# Patient Record
Sex: Male | Born: 1945
Health system: Southern US, Community
[De-identification: ages and names within clinical notes are randomized; demographics above are authoritative.]

## PROBLEM LIST (undated history)

## (undated) DIAGNOSIS — E785 Hyperlipidemia, unspecified: Secondary | ICD-10-CM

## (undated) DIAGNOSIS — Z720 Tobacco use: Secondary | ICD-10-CM

## (undated) DIAGNOSIS — Z9289 Personal history of other medical treatment: Secondary | ICD-10-CM

## (undated) DIAGNOSIS — N186 End stage renal disease: Secondary | ICD-10-CM

## (undated) DIAGNOSIS — F1010 Alcohol abuse, uncomplicated: Secondary | ICD-10-CM

## (undated) DIAGNOSIS — I219 Acute myocardial infarction, unspecified: Secondary | ICD-10-CM

## (undated) DIAGNOSIS — N1832 Chronic kidney disease, stage 3b: Secondary | ICD-10-CM

## (undated) DIAGNOSIS — M1990 Unspecified osteoarthritis, unspecified site: Secondary | ICD-10-CM

## (undated) DIAGNOSIS — I10 Essential (primary) hypertension: Secondary | ICD-10-CM

## (undated) HISTORY — PX: HERNIA REPAIR: SHX51

## (undated) HISTORY — DX: Personal history of other medical treatment: Z92.89

## (undated) HISTORY — DX: Unspecified osteoarthritis, unspecified site: M19.90

## (undated) HISTORY — PX: SPINE SURGERY: SHX786

## (undated) HISTORY — DX: Essential (primary) hypertension: I10

## (undated) HISTORY — DX: Hyperlipidemia, unspecified: E78.5

## (undated) HISTORY — PX: BACK SURGERY: SHX140

---

## 2002-08-09 ENCOUNTER — Encounter: Admission: RE | Admit: 2002-08-09 | Discharge: 2002-09-09 | Payer: Self-pay | Admitting: Neurosurgery

## 2008-08-25 ENCOUNTER — Encounter: Admission: RE | Admit: 2008-08-25 | Discharge: 2008-08-25 | Payer: Self-pay | Admitting: Internal Medicine

## 2008-09-02 DIAGNOSIS — Z9289 Personal history of other medical treatment: Secondary | ICD-10-CM

## 2008-09-02 HISTORY — DX: Personal history of other medical treatment: Z92.89

## 2012-03-08 ENCOUNTER — Other Ambulatory Visit: Payer: Self-pay | Admitting: Nephrology

## 2012-03-15 ENCOUNTER — Ambulatory Visit
Admission: RE | Admit: 2012-03-15 | Discharge: 2012-03-15 | Disposition: A | Discharge status: Home / Self Care | Payer: BC Managed Care – PPO | Source: Ambulatory Visit | Attending: Nephrology | Admitting: Nephrology

## 2012-09-20 ENCOUNTER — Telehealth: Payer: Self-pay | Admitting: Cardiovascular Disease

## 2012-09-20 MED ORDER — SIMVASTATIN 40 MG PO TABS: 40.0000 mg | ORAL_TABLET | Freq: Every day | ORAL | Status: DC

## 2012-09-20 NOTE — Telephone Encounter (Signed)
Need refill on his Simvastatin 40mg  #60-Call to [facility] on [address]!

## 2013-02-17 ENCOUNTER — Encounter: Payer: Self-pay | Admitting: *Deleted

## 2013-02-21 ENCOUNTER — Encounter: Payer: Self-pay | Admitting: Physician Assistant

## 2013-02-21 ENCOUNTER — Ambulatory Visit (INDEPENDENT_AMBULATORY_CARE_PROVIDER_SITE_OTHER): Payer: BC Managed Care – PPO | Admitting: Physician Assistant

## 2013-02-21 VITALS — BP 110/60 | HR 82 | Ht 70.0 in | Wt 196.0 lb

## 2013-02-21 DIAGNOSIS — Z720 Tobacco use: Secondary | ICD-10-CM | POA: Insufficient documentation

## 2013-02-21 DIAGNOSIS — I10 Essential (primary) hypertension: Secondary | ICD-10-CM | POA: Insufficient documentation

## 2013-02-21 DIAGNOSIS — F17200 Nicotine dependence, unspecified, uncomplicated: Secondary | ICD-10-CM

## 2013-02-21 DIAGNOSIS — E785 Hyperlipidemia, unspecified: Secondary | ICD-10-CM | POA: Insufficient documentation

## 2013-02-21 MED ORDER — VARENICLINE TARTRATE 0.5 MG PO TABS: 0.5000 mg | ORAL_TABLET | Freq: Two times a day (BID) | ORAL | Status: DC

## 2013-02-21 NOTE — Assessment & Plan Note (Signed)
We discuss tobacco cessation for 3-10 minutes.  He would like to try Chantix again so I gave him a start pack and prescription.

## 2013-02-21 NOTE — Assessment & Plan Note (Signed)
BP is well controlled at this time.  ?

## 2013-02-21 NOTE — Assessment & Plan Note (Signed)
Last lipid panel, 08/25/12: TC 163, TG 71, HDL 36, VLDL 14, LDL 113.  We discussed dietary changes briefly.

## 2013-02-21 NOTE — Progress Notes (Signed)
Date:  02/21/2013   ID:  Ruben [name], DOB July 07, 1945, MRN [id number]  PCP:  Salena Saner., MD  Primary Cardiologist:  Gwenlyn Found    History of Present Illness: Ruben [name] is a 67 y.o. married African American male, father of 61 and grandfather to 55 grandchildren, who last saw Dr. Gwenlyn Found on 08/30/12.   He works as a Administrator. He has a history of hypertension, hyperlipidemia, and ongoing tobacco abuse of 50 pack years and is still smoking 1 to 1-1/2 packs per day. He was counseled on tobacco cessation during his last office visit.  He has a positive family history for heart disease and a brother who died of an MI at age 69. He stopped drinking alcohol. He has adjusted his diet.  Dr. Gwenlyn Found doubled his statin from simvastatin 20 to 40 with improvement in his total cholesterol from 179 to 163 and his LDL from 120 to 113.  His last stress test was....  He presents today for six month evaluation.  He reports doing well but continues to smoke.  We discussed smoking cessation again and he would like to try Chantix again.  He unfortunately had to go to a funeral this past weekend.   He currently denies nausea, vomiting, fever, chest pain, shortness of breath, orthopnea, dizziness, PND, cough, congestion, abdominal pain, hematochezia, melena, lower extremity edema, claudication.  Wt Readings from Last 3 Encounters:  02/21/13 196 lb (88.905 kg)     Past Medical History  Diagnosis Date  . Hypertension   . Hyperlipidemia   . Hx of echocardiogram 09/2008    was essentially normal  . History of stress test 09/2008    showed inferolateral scar without ischemia    Current Outpatient Prescriptions  Medication Sig Dispense Refill  . acetaminophen (TYLENOL) 500 MG tablet Take 500 mg by mouth every 6 (six) hours as needed for pain.      Marland Kitchen aspirin 81 MG tablet Take 81 mg by mouth daily.      . cholecalciferol (VITAMIN D) 1000 UNITS tablet Take 1,000 Units by mouth daily. Takes two  daily      . fenofibrate (TRICOR) 145 MG tablet Take 145 mg by mouth daily.      . Garlic 123XX123 MG CAPS Take 1,000 mg by mouth daily.      . naproxen sodium (ANAPROX) 220 MG tablet Take 220 mg by mouth 2 (two) times daily as needed.      Marland Kitchen olmesartan-hydrochlorothiazide (BENICAR HCT) 40-25 MG per tablet Take 1 tablet by mouth daily.      . simvastatin (ZOCOR) 40 MG tablet Take 1 tablet (40 mg total) by mouth at bedtime.  90 tablet  3  . tadalafil (CIALIS) 10 MG tablet Take 10 mg by mouth daily as needed for erectile dysfunction.      . varenicline (CHANTIX) 0.5 MG tablet Take 1 tablet (0.5 mg total) by mouth 2 (two) times daily.  60 tablet  1   No current facility-administered medications for this visit.    Allergies:   Not on File  Social History:  The patient  reports that he has been smoking.  He does not have any smokeless tobacco history on file. He reports that he drinks alcohol. He reports that he does not use illicit drugs.   Family history:   Family History  Problem Relation Age of Onset  . Heart attack Brother     ROS:  Please see the history of present illness.  All other systems reviewed and negative.   PHYSICAL EXAM: VS:  BP 110/60  Pulse 82  Ht 5\' 10"  (1.778 m)  Wt 196 lb (88.905 kg)  BMI 28.12 kg/m2 overweight, well developed, in no acute distress HEENT: Pupils are equal round react to light accommodation extraocular movements are intact.  Neck: no JVDNo cervical lymphadenopathy.  No carotid bruits. Cardiac: Regular rate and rhythm without murmurs rubs or gallops. Lungs:  clear to auscultation bilaterally, no wheezing, rhonchi or rales Abd: soft, nontender, positive bowel sounds all quadrants, no hepatosplenomegaly Ext: no lower extremity edema.  2+ radial and 1+ dorsalis pedis pulses. Skin: warm and dry Neuro:  Grossly normal  EKG:  Sinus rhythm with likely left atrial enlargement. Rate 83  ASSESSMENT AND PLAN:  Problem List Items Addressed This Visit    Tobacco abuse     We discuss tobacco cessation for 3-10 minutes.  He would like to try Chantix again so I gave him a start pack and prescription.     Relevant Medications      varenicline (CHANTIX) tablet   HTN (hypertension) - Primary     BP is well controlled at this time.    Relevant Orders      EKG 12-Lead   HLD (hyperlipidemia)     Last lipid panel, 08/25/12: TC 163, TG 71, HDL 36, VLDL 14, LDL 113.  We discussed dietary changes briefly.        Follow up with Dr. Gwenlyn Found is six months.

## 2013-02-21 NOTE — Patient Instructions (Signed)
I will send the Chantix prescription to your pharmacy.  Follow up with Dr. Gwenlyn Found in six months.

## 2013-02-22 ENCOUNTER — Encounter: Payer: Self-pay | Admitting: Cardiovascular Disease

## 2013-10-15 ENCOUNTER — Other Ambulatory Visit: Payer: Self-pay | Admitting: Cardiovascular Disease

## 2013-10-17 NOTE — Telephone Encounter (Signed)
Rx was sent to pharmacy electronically. 

## 2014-03-21 ENCOUNTER — Ambulatory Visit (INDEPENDENT_AMBULATORY_CARE_PROVIDER_SITE_OTHER): Payer: BC Managed Care – PPO | Admitting: Sports Medicine

## 2014-03-21 VITALS — BP 102/64 | HR 94 | Temp 98.1°F | Resp 24 | Ht 70.25 in | Wt 188.0 lb

## 2014-03-21 DIAGNOSIS — M19071 Primary osteoarthritis, right ankle and foot: Secondary | ICD-10-CM

## 2014-03-21 MED ORDER — TRAMADOL HCL 50 MG PO TABS: 50.0000 mg | ORAL_TABLET | Freq: Two times a day (BID) | ORAL | Status: DC | PRN

## 2014-03-21 MED ORDER — PREDNISONE 10 MG PO TABS
ORAL_TABLET | ORAL | Status: DC
Start: 2014-03-21 — End: 2015-04-15

## 2014-03-21 NOTE — Progress Notes (Signed)
   Subjective:    Patient ID: Ruben [name], male    DOB: Mar 25, 1946, 68 y.o.   MRN: [id number]  HPI Ruben [name] is a 68yo male who presents with bilateral foot and ankle pain. He had L3 and L4 discectomy in 2004 with Dr. Luiz Ochoa. He noted some chronic pain, which has slightly been worsening. Occasionally it will radiation proximally. Mostly it is in the right side. He wears compression sleeves with some relief. Hx of pre-diabetes. No acute injury. He has a history of gout, on allopurinol. Location is around the right ankle. No fevers, chills, numbness, tingling, or weakness. His pain occasionally wakes him up at night. He tried taking Aleve and Tylenol. He ambulates with the assistance of a cane. He denies any significant low back pain, saddle anesthesia, or loss of bladder bowel.  Past medical history, social history, medications, and allergies were reviewed and are up to date in the chart.  Review of Systems  11 point review of systems was performed and was otherwise negative unless noted in the history of present illness.     Objective:   Physical Exam BP 102/64 mmHg  Pulse 94  Temp(Src) 98.1 F (36.7 C) (Oral)  Resp 24  Ht 5' 10.25" (1.784 m)  Wt 188 lb (85.276 kg)  BMI 26.79 kg/m2  SpO2 99% GEN: The patient is well-developed well-nourished male and in no acute distress.  He is awake alert and oriented x3. SKIN: warm and well-perfused, no rash  EXTR: No lower extremity edema or calf tenderness Neuro: Strength 5/5 globally. Sensation intact throughout. DTRs 2/4 bilaterally. No focal deficits. No bony tenderness to palpation of the lumbar spine. Vasc: +2 bilateral distal pulses. No edema.  MSK: examination of the bilateral feet and ankles reveals a pes planus deformity of the right greater than left foot with hyperpronation at the subtalar joint. He has tenderness to palpation over the medial and lateral ankle mortise diffusely. There is no swelling or induration. He has fairly  good strength with plantar flexion and dorsiflexion. Bilateral he has stiffness of both subtalar joints. No ligamentous laxity.     Assessment & Plan:  1. Bilateral Ankle Osteoarthritis  -Rx 6 Day Medrol Dose Pack -Rx Tramadol prn -Rest, ice, elevate, compression -Follow-up with PCP for consideration of Ortho referral if sx persist -If increasing pain, swelling, fevers, chills, or for any other concerns, RTC or call your physician.  Dr. Linnell Fulling, DO Sports Medicine Fellow

## 2014-03-21 NOTE — Patient Instructions (Signed)
DO NOT Take Aleve (Naproxen), Ibuprofen, or Meloxicam while you are on the prednisone. The prednisone replaces the other anti-inflammatories over the next 6 days.  Continue Compression, Elevate, Ice your ankle. If you have increasing pain, fevers, chills, or swelling, then call your doctor or return to clinic.  Medrol Dose Pak- 6 Day Course Instructions: (Prednisone 10mg  tablets, #21 tablets)   Day 1 (60mg  total): Take 2 tablets before breakfast, 1 tablet after lunch, 1 tablet after dinner, and 2 tablets at bedtime. **Take 6 tablets on the day when you receive your prescription, even if not able to take according to the Day 1 schedule.   Day 2 (50mg  total): Take 1 tablet before breakfast, 1 tablet after lunch, 1 tablet after dinner, and 2 tablets at bedtime.  Day 3 (40mg  total): Take 1 tablet before breakfast, 1 tablet after lunch, 1 tablet after dinner, and 1 tablet at bedtime.  Day 4 (30mg  total): Take 1 tablet before breakfast, 1 tablet after lunch, 1 tablet after dinner.  Day 5 (20mg  total): Take 1 tablet before breakfast, 1 tablet at bedtime.  Day 6 (10mg  total): Take 1 tablet before breakfast.

## 2014-04-10 ENCOUNTER — Ambulatory Visit
Admission: RE | Admit: 2014-04-10 | Discharge: 2014-04-10 | Disposition: A | Discharge status: Home / Self Care | Payer: BC Managed Care – PPO | Source: Ambulatory Visit | Attending: Family | Admitting: Family

## 2014-04-10 ENCOUNTER — Other Ambulatory Visit: Payer: Self-pay | Admitting: Family

## 2014-04-10 DIAGNOSIS — M79672 Pain in left foot: Secondary | ICD-10-CM

## 2015-04-15 ENCOUNTER — Ambulatory Visit (INDEPENDENT_AMBULATORY_CARE_PROVIDER_SITE_OTHER): Payer: BLUE CROSS/BLUE SHIELD | Admitting: Family Medicine

## 2015-04-15 VITALS — BP 110/64 | HR 99 | Temp 99.1°F | Resp 18

## 2015-04-15 DIAGNOSIS — M10061 Idiopathic gout, right knee: Secondary | ICD-10-CM | POA: Diagnosis not present

## 2015-04-15 MED ORDER — PREDNISONE 20 MG PO TABS
ORAL_TABLET | ORAL | Status: DC
Start: 2015-04-15 — End: 2015-04-21

## 2015-04-15 MED ORDER — HYDROCODONE-ACETAMINOPHEN 5-325 MG PO TABS: 1.0000 | ORAL_TABLET | Freq: Four times a day (QID) | ORAL | Status: DC | PRN

## 2015-04-15 MED ORDER — METHYLPREDNISOLONE ACETATE 80 MG/ML IJ SUSP
80.0000 mg | Freq: Once | INTRAMUSCULAR | Status: AC
  Administered 2015-04-15: 80 mg via INTRAMUSCULAR

## 2015-04-15 NOTE — Patient Instructions (Signed)

## 2015-04-15 NOTE — Progress Notes (Signed)
   Subjective:    Patient ID: Ruben [name], male    DOB: 01-29-1946, 69 y.o.   MRN: [id number] By signing my name below, I, Zola Button, attest that this documentation has been prepared under the direction and in the presence of Robyn Haber, MD.  Electronically Signed: Zola Button, Medical Scribe. 04/15/2015. 1:01 PM.  HPI HPI Comments: Ruben [name] is a 69 y.o. male with a history of gout and arthritis who presents to the [facility] complaining of gradual onset, intermittent, right leg pain in his ankle and knee that started about 6 weeks ago, but worsened over the past few days with the cold weather. Patient has had associated joint swelling. He has been applying heat and ice which has improved the swelling. He has been having difficulty ambulating due to the pain. Patient has taken prednisone in the past but does not remember the last time he took prednisone.   He does take allopurinol for gout.  Review of Systems  Musculoskeletal: Positive for joint swelling and arthralgias.       Objective:   Physical Examseen with wife CONSTITUTIONAL: Well developed/well nourished HEAD: Normocephalic/atraumatic EYES: EOM/PERRL ENMT: Mucous membranes moist NECK: supple no meningeal signs SPINE: entire spine nontender GU: no cva tenderness NEURO: Pt is awake/alert, moves all extremitiesx4 EXTREMITIES: pulses normal, full ROM. Large effusion right knee. Small amount of swelling in right ankle. SKIN: warm, color normal PSYCH: no abnormalities of mood noted        Assessment & Plan:   This chart was scribed in my presence and reviewed by me personally.    ICD-9-CM ICD-10-CM   1. Acute idiopathic gout of right knee 274.01 M10.061 methylPREDNISolone acetate (DEPO-MEDROL) injection 80 mg     predniSONE (DELTASONE) 20 MG tablet     HYDROcodone-acetaminophen (NORCO) 5-325 MG tablet     Signed, Robyn Haber, MD

## 2015-04-20 ENCOUNTER — Telehealth: Payer: Self-pay

## 2015-04-20 DIAGNOSIS — M10061 Idiopathic gout, right knee: Secondary | ICD-10-CM

## 2015-04-20 NOTE — Telephone Encounter (Signed)
Pt was seen here on 12/11 by Dr. Joseph Art for Acute idiopathic gout of right knee - Primary. Pt's daughter called stating her dad was having knee pain when he is walking. He would like a refill on his predniSONE (DELTASONE) 20 MG tablet [id number] and HYDROcodone-acetaminophen (NORCO) 5-325 MG tablet [id number]. Pharmacy [facility] at [address] CB # [phone]

## 2015-04-21 MED ORDER — PREDNISONE 20 MG PO TABS: ORAL_TABLET | ORAL | Status: DC

## 2015-04-21 NOTE — Telephone Encounter (Signed)
The prednisone has been sent to the pharmacy.  Dr Joseph Art did not want him to have any further hydrocodone.

## 2015-04-21 NOTE — Telephone Encounter (Signed)
Pt daughter Leonia Reeves notified

## 2015-04-21 NOTE — Telephone Encounter (Signed)
Patient's daughter Leonia Reeves called to check status of message from yesterday. Cb# [phone]

## 2015-04-22 ENCOUNTER — Other Ambulatory Visit: Payer: Self-pay | Admitting: Cardiovascular Disease

## 2015-04-23 NOTE — Telephone Encounter (Signed)
Rx(s) sent to pharmacy electronically.  

## 2015-09-14 ENCOUNTER — Ambulatory Visit (INDEPENDENT_AMBULATORY_CARE_PROVIDER_SITE_OTHER): Payer: BLUE CROSS/BLUE SHIELD | Admitting: Family Medicine

## 2015-09-14 VITALS — BP 106/48 | HR 93 | Temp 98.1°F | Resp 17 | Ht 70.25 in | Wt 185.0 lb

## 2015-09-14 DIAGNOSIS — M10061 Idiopathic gout, right knee: Secondary | ICD-10-CM | POA: Diagnosis not present

## 2015-09-14 MED ORDER — METHYLPREDNISOLONE ACETATE 80 MG/ML IJ SUSP
80.0000 mg | Freq: Once | INTRAMUSCULAR | Status: AC
  Administered 2015-09-14: 80 mg via INTRAMUSCULAR

## 2015-09-14 MED ORDER — PREDNISONE 20 MG PO TABS: ORAL_TABLET | ORAL | Status: DC

## 2015-09-14 NOTE — Progress Notes (Signed)
   Subjective:    Patient ID: Ruben [name], male    DOB: 1945-07-19, 70 y.o.   MRN: [id number]  HPI This is a pleasant 70 yo male who presents today with left knee pain. This is a flare of his typical gout. He started to have pain 7 days ago. No falls. Feels weak in knee. Occasionally has a flare in his left foot. Thinks flare brought on by recent damp, cool weather. Goes to [facility] every 3 months and is seen at [facility]. He does one long drive a week. Was seen 12/16 and given depo-medrol, he did not need all of oral prednisone he was given. He requests injection again, states oral prednisone not as effective.   Past Medical History  Diagnosis Date  . Hypertension   . Hyperlipidemia   . Hx of echocardiogram 09/2008    was essentially normal  . History of stress test 09/2008    showed inferolateral scar without ischemia  . Arthritis    Past Surgical History  Procedure Laterality Date  . Back surgery      Dr Luiz Ochoa involving L3-L4 discectomy.  Marland Kitchen Hernia repair    . Spine surgery     Family History  Problem Relation Age of Onset  . Heart attack Brother   . Diabetes Brother   . Heart disease Brother   . Cancer Father   . Diabetes Brother   . Heart disease Brother    Social History  Substance Use Topics  . Smoking status: Current Every Day Smoker -- 1.50 packs/day for 20 years  . Smokeless tobacco: Never Used  . Alcohol Use: No     Review of Systems No fever or chills, no chest pain or SOB    Objective:   Physical Exam NAD Right knee swollen and warm, no erythema. Decreased ROM due to swelling. Generalized tenderness.   BP 106/48 mmHg  Pulse 93  Temp(Src) 98.1 F (36.7 C) (Oral)  Resp 17  Ht 5' 10.25" (1.784 m)  Wt 185 lb (83.915 kg)  BMI 26.37 kg/m2  SpO2 98%     Assessment & Plan:  1. Acute idiopathic gout of right knee - this is recurrent problem for patient and I discussed treatment options. I discouraged him with regards to  depo medrol injection but he insists it works better. I reviewed potential site side effects and prefers to go ahead with injection - predniSONE (DELTASONE) 20 MG tablet; Two daily with food  Dispense: 10 tablet; Refill: 0 - methylPREDNISolone acetate (DEPO-MEDROL) injection 80 mg; Inject 1 mL (80 mg total) into the muscle once.   Clarene Reamer, FNP-BC  [facility]  09/14/2015 5:36 PM

## 2015-09-14 NOTE — Patient Instructions (Addendum)
Prednisone tablets What is this medicine? PREDNISONE (PRED ni sone) is a corticosteroid. It is commonly used to treat inflammation of the skin, joints, lungs, and other organs. Common conditions treated include asthma, allergies, and arthritis. It is also used for other conditions, such as blood disorders and diseases of the adrenal glands. This medicine may be used for other purposes; ask your health care provider or pharmacist if you have questions. What should I tell my health care provider before I take this medicine? They need to know if you have any of these conditions: -Cushing's syndrome -diabetes -glaucoma -heart disease -high blood pressure -infection (especially a virus infection such as chickenpox, cold sores, or herpes) -kidney disease -liver disease -mental illness -myasthenia gravis -osteoporosis -seizures -stomach or intestine problems -thyroid disease -an unusual or allergic reaction to lactose, prednisone, other medicines, foods, dyes, or preservatives -pregnant or trying to get pregnant -breast-feeding How should I use this medicine? Take this medicine by mouth with a Kieara Schwark of water. Follow the directions on the prescription label. Take this medicine with food. If you are taking this medicine once a day, take it in the morning. Do not take more medicine than you are told to take. Do not suddenly stop taking your medicine because you may develop a severe reaction. Your doctor will tell you how much medicine to take. If your doctor wants you to stop the medicine, the dose may be slowly lowered over time to avoid any side effects. Talk to your pediatrician regarding the use of this medicine in children. Special care may be needed. Overdosage: If you think you have taken too much of this medicine contact a poison control center or emergency room at once. NOTE: This medicine is only for you. Do not share this medicine with others. What if I miss a dose? If you miss a dose,  take it as soon as you can. If it is almost time for your next dose, talk to your doctor or health care professional. You may need to miss a dose or take an extra dose. Do not take double or extra doses without advice. What may interact with this medicine? Do not take this medicine with any of the following medications: -metyrapone -mifepristone This medicine may also interact with the following medications: -aminoglutethimide -amphotericin B -aspirin and aspirin-like medicines -barbiturates -certain medicines for diabetes, like glipizide or glyburide -cholestyramine -cholinesterase inhibitors -cyclosporine -digoxin -diuretics -ephedrine -male hormones, like estrogens and birth control pills -isoniazid -ketoconazole -NSAIDS, medicines for pain and inflammation, like ibuprofen or naproxen -phenytoin -rifampin -toxoids -vaccines -warfarin This list may not describe all possible interactions. Give your health care provider a list of all the medicines, herbs, non-prescription drugs, or dietary supplements you use. Also tell them if you smoke, drink alcohol, or use illegal drugs. Some items may interact with your medicine. What should I watch for while using this medicine? Visit your doctor or health care professional for regular checks on your progress. If you are taking this medicine over a prolonged period, carry an identification card with your name and address, the type and dose of your medicine, and your doctor's name and address. This medicine may increase your risk of getting an infection. Tell your doctor or health care professional if you are around anyone with measles or chickenpox, or if you develop sores or blisters that do not heal properly. If you are going to have surgery, tell your doctor or health care professional that you have taken this medicine within the last   twelve months. Ask your doctor or health care professional about your diet. You may need to lower the amount  of salt you eat. This medicine may affect blood sugar levels. If you have diabetes, check with your doctor or health care professional before you change your diet or the dose of your diabetic medicine. What side effects may I notice from receiving this medicine? Side effects that you should report to your doctor or health care professional as soon as possible: -allergic reactions like skin rash, itching or hives, swelling of the face, lips, or tongue -changes in emotions or moods -changes in vision -depressed mood -eye pain -fever or chills, cough, sore throat, pain or difficulty passing urine -increased thirst -swelling of ankles, feet Side effects that usually do not require medical attention (report to your doctor or health care professional if they continue or are bothersome): -confusion, excitement, restlessness -headache -nausea, vomiting -skin problems, acne, thin and shiny skin -trouble sleeping -weight gain This list may not describe all possible side effects. Call your doctor for medical advice about side effects. You may report side effects to FDA at 1-800-FDA-1088. Where should I keep my medicine? Keep out of the reach of children. Store at room temperature between 15 and 30 degrees C (59 and 86 degrees F). Protect from light. Keep container tightly closed. Throw away any unused medicine after the expiration date. NOTE: This sheet is a summary. It may not cover all possible information. If you have questions about this medicine, talk to your doctor, pharmacist, or health care provider.    2016, Elsevier/Gold Standard. (2010-12-05 10:57:14) Gout Gout is an inflammatory arthritis caused by a buildup of uric acid crystals in the joints. Uric acid is a chemical that is normally present in the blood. When the level of uric acid in the blood is too high it can form crystals that deposit in your joints and tissues. This causes joint redness, soreness, and swelling (inflammation).  Repeat attacks are common. Over time, uric acid crystals can form into masses (tophi) near a joint, destroying bone and causing disfigurement. Gout is treatable and often preventable. CAUSES  The disease begins with elevated levels of uric acid in the blood. Uric acid is produced by your body when it breaks down a naturally found substance called purines. Certain foods you eat, such as meats and fish, contain high amounts of purines. Causes of an elevated uric acid level include:  Being passed down from parent to child (heredity).  Diseases that cause increased uric acid production (such as obesity, psoriasis, and certain cancers).  Excessive alcohol use.  Diet, especially diets rich in meat and seafood.  Medicines, including certain cancer-fighting medicines (chemotherapy), water pills (diuretics), and aspirin.  Chronic kidney disease. The kidneys are no longer able to remove uric acid well.  Problems with metabolism. Conditions strongly associated with gout include:  Obesity.  High blood pressure.  High cholesterol.  Diabetes. Not everyone with elevated uric acid levels gets gout. It is not understood why some people get gout and others do not. Surgery, joint injury, and eating too much of certain foods are some of the factors that can lead to gout attacks. SYMPTOMS   An attack of gout comes on quickly. It causes intense pain with redness, swelling, and warmth in a joint.  Fever can occur.  Often, only one joint is involved. Certain joints are more commonly involved:  Base of the big toe.  Knee.  Ankle.  Wrist.  Finger. Without treatment, an  attack usually goes away in a few days to weeks. Between attacks, you usually will not have symptoms, which is different from many other forms of arthritis. DIAGNOSIS  Your caregiver will suspect gout based on your symptoms and exam. In some cases, tests may be recommended. The tests may include:  Blood tests.  Urine  tests.  X-rays.  Joint fluid exam. This exam requires a needle to remove fluid from the joint (arthrocentesis). Using a microscope, gout is confirmed when uric acid crystals are seen in the joint fluid. TREATMENT  There are two phases to gout treatment: treating the sudden onset (acute) attack and preventing attacks (prophylaxis).  Treatment of an Acute Attack.  Medicines are used. These include anti-inflammatory medicines or steroid medicines.  An injection of steroid medicine into the affected joint is sometimes necessary.  The painful joint is rested. Movement can worsen the arthritis.  You may use warm or cold treatments on painful joints, depending which works best for you.  Treatment to Prevent Attacks.  If you suffer from frequent gout attacks, your caregiver may advise preventive medicine. These medicines are started after the acute attack subsides. These medicines either help your kidneys eliminate uric acid from your body or decrease your uric acid production. You may need to stay on these medicines for a very long time.  The early phase of treatment with preventive medicine can be associated with an increase in acute gout attacks. For this reason, during the first few months of treatment, your caregiver may also advise you to take medicines usually used for acute gout treatment. Be sure you understand your caregiver's directions. Your caregiver may make several adjustments to your medicine dose before these medicines are effective.  Discuss dietary treatment with your caregiver or dietitian. Alcohol and drinks high in sugar and fructose and foods such as meat, poultry, and seafood can increase uric acid levels. Your caregiver or dietitian can advise you on drinks and foods that should be limited. HOME CARE INSTRUCTIONS   Do not take aspirin to relieve pain. This raises uric acid levels.  Only take over-the-counter or prescription medicines for pain, discomfort, or fever as  directed by your caregiver.  Rest the joint as much as possible. When in bed, keep sheets and blankets off painful areas.  Keep the affected joint raised (elevated).  Apply warm or cold treatments to painful joints. Use of warm or cold treatments depends on which works best for you.  Use crutches if the painful joint is in your leg.  Drink enough fluids to keep your urine clear or pale yellow. This helps your body get rid of uric acid. Limit alcohol, sugary drinks, and fructose drinks.  Follow your dietary instructions. Pay careful attention to the amount of protein you eat. Your daily diet should emphasize fruits, vegetables, whole grains, and fat-free or low-fat milk products. Discuss the use of coffee, vitamin C, and cherries with your caregiver or dietitian. These may be helpful in lowering uric acid levels.  Maintain a healthy body weight. SEEK MEDICAL CARE IF:   You develop diarrhea, vomiting, or any side effects from medicines.  You do not feel better in 24 hours, or you are getting worse. SEEK IMMEDIATE MEDICAL CARE IF:   Your joint becomes suddenly more tender, and you have chills or a fever. MAKE SURE YOU:   Understand these instructions.  Will watch your condition.  Will get help right away if you are not doing well or get worse.   This  information is not intended to replace advice given to you by your health care provider. Make sure you discuss any questions you have with your health care provider.   Document Released: 04/18/2000 Document Revised: 05/12/2014 Document Reviewed: 12/03/2011 Elsevier Interactive Patient Education 2016 [employer].     IF you received an x-ray today, you will receive an invoice from [facility]. Please contact [facility] at [phone] with questions or concerns regarding your invoice.   IF you received labwork today, you will receive an invoice from [employer]. Please contact [facility]  at [phone] with questions or concerns regarding your invoice.   Our billing staff will not be able to assist you with questions regarding bills from these companies.  You will be contacted with the lab results as soon as they are available. The fastest way to get your results is to activate your My Chart account. Instructions are located on the last page of this paperwork. If you have not heard from [place] regarding the results in 2 weeks, please contact this office.

## 2016-01-16 ENCOUNTER — Telehealth: Payer: Self-pay | Admitting: Cardiovascular Disease

## 2016-01-16 NOTE — Telephone Encounter (Signed)
Received records from [facility] for appointment on 02/01/16 with Dr Gwenlyn Found.  Records given to [facility] (medical records) for Dr Kennon Holter schedule on 02/01/16. lp

## 2016-01-22 ENCOUNTER — Ambulatory Visit (INDEPENDENT_AMBULATORY_CARE_PROVIDER_SITE_OTHER): Payer: BLUE CROSS/BLUE SHIELD | Admitting: Physician Assistant

## 2016-01-22 ENCOUNTER — Ambulatory Visit (INDEPENDENT_AMBULATORY_CARE_PROVIDER_SITE_OTHER): Payer: BLUE CROSS/BLUE SHIELD

## 2016-01-22 VITALS — BP 114/66 | HR 91 | Temp 98.2°F | Resp 17 | Ht 70.25 in | Wt 183.0 lb

## 2016-01-22 DIAGNOSIS — M25561 Pain in right knee: Secondary | ICD-10-CM | POA: Diagnosis not present

## 2016-01-22 DIAGNOSIS — Z23 Encounter for immunization: Secondary | ICD-10-CM | POA: Diagnosis not present

## 2016-01-22 DIAGNOSIS — S82001A Unspecified fracture of right patella, initial encounter for closed fracture: Secondary | ICD-10-CM | POA: Diagnosis not present

## 2016-01-22 NOTE — Patient Instructions (Addendum)
Please elevate and use ice. You are non weight bearing for the next three weeks, at minimum. Use your crutches and knee immobilizer.  Follow up in 7 days so we can take another picture of your knee cap and make sure it is still not displaced.     IF you received an x-ray today, you will receive an invoice from [facility]. Please contact [facility] at [phone] with questions or concerns regarding your invoice.   IF you received labwork today, you will receive an invoice from [employer]. Please contact [facility] at [phone] with questions or concerns regarding your invoice.   Our billing staff will not be able to assist you with questions regarding bills from these companies.  You will be contacted with the lab results as soon as they are available. The fastest way to get your results is to activate your My Chart account. Instructions are located on the last page of this paperwork. If you have not heard from [place] regarding the results in 2 weeks, please contact this office.      Patellar Fracture, Adult A patellar fracture is a break in your kneecap (patella).  CAUSES   A direct blow to the knee or a fall is usually the cause of a broken patella.  A very hard and strong bending of your knee can cause a patellar fracture. RISK FACTORS Involvement in contact sports, especially sports that involve a lot of jumping. SIGNS AND SYMPTOMS   Tender and swollen knee.  Pain when you move your knee, especially when you try to straighten out your leg.  Difficulty walking or putting weight on your knee.  Misshapen knee (as if a bone is out of place). DIAGNOSIS  Patellar fracture is usually diagnosed with a physical exam and an X-ray exam. TREATMENT  Treatment depends on the type of fracture:  If your patella is still in the right position after the fracture and you can still straighten your leg out, you can usually be treated with a splint or  cast for 4-6 weeks.  If your patella is broken into multiple small pieces but you are able to straighten your leg, you can usually be treated with a splint or cast for 4-6 weeks. Sometimes your patella may need to be removed before the cast is applied.  If you cannot straighten out your leg after a patellar fracture, then surgery is required to hold the bony fragments together until they heal. A cast or splint will be applied for 4-6 weeks. HOME CARE INSTRUCTIONS   Only take over-the-counter or prescription medicines for pain, discomfort, or fever as directed by your health care provider.  Use crutches as directed, and exercise the leg as directed.  Apply ice to the injured area:  Put ice in a plastic bag.  Place a towel between your skin and the bag.  Leave the ice on for 20 minutes, 2-3 times a day.  Elevate the affected knee above the level of your heart. SEEK MEDICAL CARE IF:  You suspect you have significantly injured your knee.  You hear a pop after a knee injury.  Your knee is misshapen after a knee injury.  You have pain when you move your knee.  You have difficulty walking or putting weight on your knee.  You cannot fully move your knee. SEEK IMMEDIATE MEDICAL CARE IF:  You have redness, swelling, or increasing pain in your knee.  You have a fever.   This information is not intended  to replace advice given to you by your health care provider. Make sure you discuss any questions you have with your health care provider.   Document Released: 01/18/2003 Document Revised: 02/09/2013 Document Reviewed: 12/01/2012 Elsevier Interactive Patient Education [employer].

## 2016-01-22 NOTE — Progress Notes (Signed)
Ruben [name]  MRN: [id number] DOB: February 25, 1946  PCP: No primary care provider on file.  Subjective:  Pt is a 70 year old male, history of gout, arthritis, hypertension and hyperlipidemia, who presents to clinic for right knee pain. Last night, he was walking down steps when he came upon one that was loose, tripped and fell. He fell down one step to the sidewalk. Landed on his hands and right knee. Today he has minimal pain to b/l wrists, but his right knee is swollen, warm and painful. Pain is throughout knee. Does not radiate. No wound. Reduced range of motion, hurts to bear weight. Took ibuprofen and ice.   Gout- Currently taking Allopurinol, prescribed by [facility].  He was seen here for gout 09/2015 and 04/2015. Injection of Depo-medrol IM administered both times.  PCP [facility]. Annual physical Aug 2017, started on Allopurinol. Is taking it daily.   Arthritis- Experiences knee pain with weather changes.   Would like flu shot today.   Review of Systems  Constitutional: Negative for chills, diaphoresis and fever.  Respiratory: Negative.   Cardiovascular: Negative.   Musculoskeletal: Positive for arthralgias (knee pain, right), gait problem and joint swelling.  Skin: Negative.     Patient Active Problem List   Diagnosis Date Noted  . HTN (hypertension) 02/21/2013  . Tobacco abuse 02/21/2013  . HLD (hyperlipidemia) 02/21/2013    Current Outpatient Prescriptions on File Prior to Visit  Medication Sig Dispense Refill  . acetaminophen (TYLENOL) 500 MG tablet Take 500 mg by mouth every 6 (six) hours as needed for pain.    Marland Kitchen allopurinol (ZYLOPRIM) 100 MG tablet Take 100 mg by mouth 2 (two) times daily.    Marland Kitchen aspirin 81 MG tablet Take 81 mg by mouth daily.    . Cholecalciferol (VITAMIN D3) 5000 UNITS CAPS Take 1 capsule by mouth daily.    . fenofibrate (TRICOR) 145 MG tablet Take 145 mg by mouth daily.    . Garlic 4081 MG CAPS Take 1,000 mg by  mouth daily.    Marland Kitchen HYDROcodone-acetaminophen (NORCO) 5-325 MG tablet Take 1 tablet by mouth every 6 (six) hours as needed for moderate pain. 12 tablet 0  . naproxen sodium (ANAPROX) 220 MG tablet Take 220 mg by mouth 2 (two) times daily as needed.    Marland Kitchen olmesartan-hydrochlorothiazide (BENICAR HCT) 40-25 MG per tablet Take 1 tablet by mouth daily.    . predniSONE (DELTASONE) 20 MG tablet Two daily with food 10 tablet 0  . simvastatin (ZOCOR) 40 MG tablet TAKE 1 TABLET BY MOUTH EVERY NIGHT AT BEDTIME 90 tablet 1  . tadalafil (CIALIS) 10 MG tablet Take 10 mg by mouth daily as needed for erectile dysfunction.    . traMADol (ULTRAM) 50 MG tablet Take 1 tablet (50 mg total) by mouth every 12 (twelve) hours as needed. 30 tablet 0   No current facility-administered medications on file prior to visit.     No Known Allergies  Objective:  BP 114/66 (BP Location: Right Arm, Patient Position: Sitting, Cuff Size: Normal)   Pulse 91   Temp 98.2 F (36.8 C) (Oral)   Resp 17   Ht 5' 10.25" (1.784 m)   Wt 183 lb (83 kg)   SpO2 97%   BMI 26.07 kg/m   Physical Exam  Constitutional: He is oriented to person, place, and time and well-developed, well-nourished, and in no distress. No distress.  Cardiovascular: Normal rate, regular rhythm and normal heart sounds.  Musculoskeletal:       Right knee: He exhibits decreased range of motion, swelling and abnormal patellar mobility. Tenderness found.       Legs: Neurological: He is alert and oriented to person, place, and time. GCS score is 15.  Skin: Skin is warm and dry.  Psychiatric: Mood, memory, affect and judgment normal.  Vitals reviewed.  Dg Knee Complete 4 Views Right  Result Date: 01/22/2016 CLINICAL DATA:  Initial encounter for trauma, fall. Also possible gout attack. EXAM: RIGHT KNEE - COMPLETE 4+ VIEW COMPARISON:  None. FINDINGS: Lucency through the lateral aspect of the patella is suspicious for nondisplaced fracture. Only apparent on the  sunrise view. There is a small suprapatellar joint effusion. Medial and patellofemoral compartment osteoarthritis is mild. No significant prepatellar soft tissue swelling. IMPRESSION: Lucency through the lateral aspect of the patellar, suspicious for nondisplaced fracture. Recommend correlation with point tenderness, especially given absence of significant overlying soft tissue swelling. Osteoarthritis with small suprapatellar joint effusion. Electronically Signed   By: Abigail Miyamoto M.D.   On: 01/22/2016 16:27    Assessment and Plan :  1. Patella fracture, right, closed, initial encounter 2. Knee pain, acute, right - Patient declined crutches today as he has some at home and would rather use those. He understands that he is to be non-weightbearing until otherwise instructed.  Knee was wrapped with ace wrap and placed in a leg immobilizer, he is instructed to keep immobilizer on at all times, even in the shower. He verbalizes understanding. Encouraged use of compression, ice and elevation while resting. Motrin for pain.  - F/u 7 days for repeat x-ray to ensure fracture continues to be non-displaced. If this is not the case, refer to ortho surgery.  - Patient will receive physical therapy after 4-6 weeks.  - DG Knee Complete 4 Views Right; Future  3. Flu vaccine need - Flu Vaccine QUAD 36+ mos PF IM (Fluarix & Fluzone Quad PF)   Mercer Pod, PA-C  [facility] 01/22/2016 3:40 PM

## 2016-01-24 ENCOUNTER — Telehealth: Payer: Self-pay

## 2016-01-24 ENCOUNTER — Other Ambulatory Visit: Payer: Self-pay | Admitting: Physician Assistant

## 2016-01-24 DIAGNOSIS — S82001A Unspecified fracture of right patella, initial encounter for closed fracture: Secondary | ICD-10-CM

## 2016-01-24 DIAGNOSIS — M10061 Idiopathic gout, right knee: Secondary | ICD-10-CM

## 2016-01-24 MED ORDER — HYDROCODONE-ACETAMINOPHEN 5-325 MG PO TABS: 1.0000 | ORAL_TABLET | Freq: Four times a day (QID) | ORAL | 0 refills | Status: DC | PRN

## 2016-01-24 MED ORDER — MELOXICAM 7.5 MG PO TABS: 7.5000 mg | ORAL_TABLET | Freq: Every day | ORAL | 0 refills | Status: DC

## 2016-01-24 NOTE — Telephone Encounter (Signed)
I ordered Norco and Mobic for his pain. Please let patient know it should be at his pharmacy. Thank you!

## 2016-01-24 NOTE — Telephone Encounter (Signed)
The patient was seen by Mercer Pod, PA-C on 9/19 for a fall injury.  He is requesting a prescription for pain medication.  He said he was not prescribed anything at his office visit.  Please advise.  CB#: [phone]

## 2016-01-29 ENCOUNTER — Ambulatory Visit (INDEPENDENT_AMBULATORY_CARE_PROVIDER_SITE_OTHER): Payer: BLUE CROSS/BLUE SHIELD | Admitting: Physician Assistant

## 2016-01-29 ENCOUNTER — Ambulatory Visit (INDEPENDENT_AMBULATORY_CARE_PROVIDER_SITE_OTHER): Payer: BLUE CROSS/BLUE SHIELD

## 2016-01-29 VITALS — BP 112/62 | HR 88 | Temp 98.1°F | Wt 183.0 lb

## 2016-01-29 DIAGNOSIS — S82001D Unspecified fracture of right patella, subsequent encounter for closed fracture with routine healing: Secondary | ICD-10-CM | POA: Diagnosis not present

## 2016-01-29 DIAGNOSIS — S82001A Unspecified fracture of right patella, initial encounter for closed fracture: Secondary | ICD-10-CM

## 2016-01-29 MED ORDER — HYDROCODONE-ACETAMINOPHEN 5-325 MG PO TABS: 1.0000 | ORAL_TABLET | Freq: Four times a day (QID) | ORAL | 0 refills | Status: DC | PRN

## 2016-01-29 NOTE — Progress Notes (Addendum)
Ruben [name]  MRN: [id number] DOB: 1945-12-24  PCP: No PCP Per Patient  Subjective:  Pt is a 70 year old male who presents to clinic for follow-up patella fracture.  He was here on 9/19 and saw me. He originally fell down two steps onto a sidewalk landing directly on his right knee. Since is last visit he has been elevating his knee, has kept it wrapped with ace wrap, uses his immobilizer and crutches, is non-weight bearing. He never got his norco prescription filled. Is taking his Mobic as directed. States he is in a lot of pain days. Notes occasional shooting pains in his knee and down his foot. Notes some swelling in right foot.  Not sleeping well due to pain.  Denies calf swelling, fevers, chills, temperature change in knee.   Review of Systems  Constitutional: Negative.   Respiratory: Negative.   Cardiovascular: Negative.   Musculoskeletal: Positive for arthralgias (right knee pain), gait problem and joint swelling. Negative for back pain and myalgias.  Skin: Negative.     Patient Active Problem List   Diagnosis Date Noted  . HTN (hypertension) 02/21/2013  . Tobacco abuse 02/21/2013  . HLD (hyperlipidemia) 02/21/2013    Current Outpatient Prescriptions on File Prior to Visit  Medication Sig Dispense Refill  . acetaminophen (TYLENOL) 500 MG tablet Take 500 mg by mouth every 6 (six) hours as needed for pain.    Marland Kitchen allopurinol (ZYLOPRIM) 100 MG tablet Take 100 mg by mouth 2 (two) times daily.    Marland Kitchen aspirin 81 MG tablet Take 81 mg by mouth daily.    . Cholecalciferol (VITAMIN D3) 5000 UNITS CAPS Take 1 capsule by mouth daily.    . meloxicam (MOBIC) 7.5 MG tablet Take 1 tablet (7.5 mg total) by mouth daily. 30 tablet 0  . naproxen sodium (ANAPROX) 220 MG tablet Take 220 mg by mouth 2 (two) times daily as needed.    Marland Kitchen olmesartan-hydrochlorothiazide (BENICAR HCT) 40-25 MG per tablet Take 1 tablet by mouth daily.    . simvastatin (ZOCOR) 40 MG tablet TAKE 1 TABLET BY MOUTH  EVERY NIGHT AT BEDTIME 90 tablet 1  . tadalafil (CIALIS) 10 MG tablet Take 10 mg by mouth daily as needed for erectile dysfunction.    . fenofibrate (TRICOR) 145 MG tablet Take 145 mg by mouth daily.    . Garlic 9798 MG CAPS Take 1,000 mg by mouth daily.    Marland Kitchen HYDROcodone-acetaminophen (NORCO) 5-325 MG tablet Take 1 tablet by mouth every 6 (six) hours as needed for moderate pain. (Patient not taking: Reported on 01/29/2016) 12 tablet 0  . predniSONE (DELTASONE) 20 MG tablet Two daily with food (Patient not taking: Reported on 01/29/2016) 10 tablet 0  . traMADol (ULTRAM) 50 MG tablet Take 1 tablet (50 mg total) by mouth every 12 (twelve) hours as needed. (Patient not taking: Reported on 01/29/2016) 30 tablet 0   No current facility-administered medications on file prior to visit.     No Known Allergies  Objective:  BP 112/62 (BP Location: Right Arm, Cuff Size: Normal)   Pulse 88   Temp 98.1 F (36.7 C)   Wt 183 lb (83 kg)   BMI 26.07 kg/m   Physical Exam  Constitutional: He is oriented to person, place, and time and well-developed, well-nourished, and in no distress. No distress.  Cardiovascular: Normal rate, regular rhythm and normal heart sounds.   Pulmonary/Chest: Effort normal. No respiratory distress.  Musculoskeletal:       Right  knee: He exhibits decreased range of motion, swelling and bony tenderness. He exhibits no deformity and no erythema.  Minimal swelling right knee. Good quad strength. Is able to raise leg without assistance against gravity.    Neurological: He is alert and oriented to person, place, and time. GCS score is 15.  Skin: Skin is warm and dry.  Psychiatric: Mood, memory, affect and judgment normal.  Vitals reviewed.  Dg Knee Complete 4 Views Right  Result Date: 01/29/2016 CLINICAL DATA:  Followup patella fracture.  Subsequent encounter EXAM: RIGHT KNEE - COMPLETE 4+ VIEW COMPARISON:  01/22/2016 FINDINGS: Stable vertical fracture lucency through the lateral  patella, nondisplaced. Suprapatellar joint effusion remains large volume without visible fat component. No new abnormality is detected. Mild patellofemoral joint narrowing with degenerative marginal spurs. IMPRESSION: 1. Lateral patella fracture remains nondisplaced. Joint effusion remains large volume. 2. Patellofemoral osteoarthritis with mild joint narrowing. Electronically Signed   By: Monte Fantasia M.D.   On: 01/29/2016 09:58    Assessment and Plan :  1. Patella fracture, right, closed, with routine healing, subsequent encounter - DG Knee Complete 4 Views Right; Future - HYDROcodone-acetaminophen (NORCO) 5-325 MG tablet; Take 1 tablet by mouth every 6 (six) hours as needed for moderate pain.  Dispense: 12 tablet; Refill: 0 - Instructed patient to fill Norco and take as directed, as this will help with pain control.  - Follow-up in 7-10 days for repeat x-ray. Continue to wear knee immobilizer, bear weight as tolerated with crutches.  Reduced activity for 3-6 weeks, progressive ROM, and strengthening exercises. Quad strengthening exercises demonstrated for patient.  - Will refer to PT at his next visit as long as knee xray shows patellar union.  - Discussed plan with Dr. Carlota Raspberry.   Mercer Pod, PA-C  [facility] 01/29/2016 9:18 AM

## 2016-01-29 NOTE — Patient Instructions (Addendum)
Your fracture is healing well. Please follow-up in 7-10 days. We will repeat x-rays at that time and possibly refer you to physical therapy for rehab.   You can weight bear at this time with crutches, however please continue to wear your knee immobilizer until your follow-up appointment.  Please exercise your leg a few times a day with the leg raise exercises discussed at your appointment.    Thank you for coming in today. I hope you feel we met your needs.  Feel free to call [facility] if you have any questions or further requests.  Please consider signing up for MyChart if you do not already have it, as this is a great way to communicate with me.  Best,  [employer], PA-C

## 2016-02-01 ENCOUNTER — Ambulatory Visit: Payer: Self-pay | Admitting: Cardiovascular Disease

## 2016-02-05 ENCOUNTER — Ambulatory Visit (INDEPENDENT_AMBULATORY_CARE_PROVIDER_SITE_OTHER): Payer: BLUE CROSS/BLUE SHIELD | Admitting: Urgent Care

## 2016-02-05 ENCOUNTER — Ambulatory Visit (INDEPENDENT_AMBULATORY_CARE_PROVIDER_SITE_OTHER): Payer: BLUE CROSS/BLUE SHIELD

## 2016-02-05 VITALS — BP 112/68 | HR 85 | Temp 98.5°F | Resp 17 | Ht 70.25 in | Wt 180.0 lb

## 2016-02-05 DIAGNOSIS — M25561 Pain in right knee: Secondary | ICD-10-CM

## 2016-02-05 DIAGNOSIS — S82001D Unspecified fracture of right patella, subsequent encounter for closed fracture with routine healing: Secondary | ICD-10-CM

## 2016-02-05 MED ORDER — HYDROCODONE-ACETAMINOPHEN 5-325 MG PO TABS: 1.0000 | ORAL_TABLET | Freq: Four times a day (QID) | ORAL | 0 refills | Status: DC | PRN

## 2016-02-05 NOTE — Patient Instructions (Addendum)
Tylenol You may take 500-750mg  every 6 hours for pain and inflammation.    Patellar Fracture, Adult A patellar fracture is a break in your kneecap (patella).  CAUSES   A direct blow to the knee or a fall is usually the cause of a broken patella.  A very hard and strong bending of your knee can cause a patellar fracture. RISK FACTORS Involvement in contact sports, especially sports that involve a lot of jumping. SIGNS AND SYMPTOMS   Tender and swollen knee.  Pain when you move your knee, especially when you try to straighten out your leg.  Difficulty walking or putting weight on your knee.  Misshapen knee (as if a bone is out of place). DIAGNOSIS  Patellar fracture is usually diagnosed with a physical exam and an X-ray exam. TREATMENT  Treatment depends on the type of fracture:  If your patella is still in the right position after the fracture and you can still straighten your leg out, you can usually be treated with a splint or cast for 4-6 weeks.  If your patella is broken into multiple small pieces but you are able to straighten your leg, you can usually be treated with a splint or cast for 4-6 weeks. Sometimes your patella may need to be removed before the cast is applied.  If you cannot straighten out your leg after a patellar fracture, then surgery is required to hold the bony fragments together until they heal. A cast or splint will be applied for 4-6 weeks. HOME CARE INSTRUCTIONS   Only take over-the-counter or prescription medicines for pain, discomfort, or fever as directed by your health care provider.  Use crutches as directed, and exercise the leg as directed.  Apply ice to the injured area:  Put ice in a plastic bag.  Place a towel between your skin and the bag.  Leave the ice on for 20 minutes, 2-3 times a day.  Elevate the affected knee above the level of your heart. SEEK MEDICAL CARE IF:  You suspect you have significantly injured your knee.  You  hear a pop after a knee injury.  Your knee is misshapen after a knee injury.  You have pain when you move your knee.  You have difficulty walking or putting weight on your knee.  You cannot fully move your knee. SEEK IMMEDIATE MEDICAL CARE IF:  You have redness, swelling, or increasing pain in your knee.  You have a fever.   This information is not intended to replace advice given to you by your health care provider. Make sure you discuss any questions you have with your health care provider.   Document Released: 01/18/2003 Document Revised: 02/09/2013 Document Reviewed: 12/01/2012 Elsevier Interactive Patient Education 2016 [employer].    IF you received an x-ray today, you will receive an invoice from [facility]. Please contact [facility] at [phone] with questions or concerns regarding your invoice.   IF you received labwork today, you will receive an invoice from [employer]. Please contact [facility] at [phone] with questions or concerns regarding your invoice.   Our billing staff will not be able to assist you with questions regarding bills from these companies.  You will be contacted with the lab results as soon as they are available. The fastest way to get your results is to activate your My Chart account. Instructions are located on the last page of this paperwork. If you have not heard from [place] regarding the results in 2 weeks,  please contact this office.

## 2016-02-05 NOTE — Progress Notes (Addendum)
    MRN: [id number] DOB: [birth year]  Subjective:   Ruben [name] is a 70 y.o. male presenting for follow up on right patellar fracture. Patient was last seen on 01/29/2016, was recommend to start bearing light weight on his right leg, continue using immobilizer, plan to repeat x-rays today and refer to physical therapy based off of report. Today, patient reports improvement in his right knee pain. He is ambulating more, still wearing the immobilizer, using crutches. Denies falling, worsening swelling. Has run out of hydrocodone and would like another script. Has not used any other pain or anti-inflammatory medication.  Madex has a current medication list which includes the following prescription(s): acetaminophen, allopurinol, aspirin, vitamin d3, garlic, hydrocodone-acetaminophen, meloxicam, naproxen sodium, olmesartan-hydrochlorothiazide, prednisone, simvastatin, tadalafil, tramadol, and fenofibrate. Also has No Known Allergies.  Garnell  has a past medical history of Arthritis; History of stress test (09/2008); echocardiogram (09/2008); Hyperlipidemia; and Hypertension. Also  has a past surgical history that includes Back surgery; Hernia repair; and Spine surgery.  Objective:   Vitals: BP 112/68 (BP Location: Left Arm, Patient Position: Sitting, Cuff Size: Normal)   Pulse 85   Temp 98.5 F (36.9 C) (Oral)   Resp 17   Ht 5' 10.25" (1.784 m)   Wt 180 lb (81.6 kg)   SpO2 98%   BMI 25.64 kg/m   Physical Exam  Constitutional: He is oriented to person, place, and time. He appears well-developed and well-nourished.  Cardiovascular: Normal rate.   Pulmonary/Chest: Effort normal.  Neurological: He is alert and oriented to person, place, and time.   Dg Knee Complete 4 Views Right  Result Date: 02/05/2016 CLINICAL DATA:  Follow-up patella fracture EXAM: RIGHT KNEE - COMPLETE 4+ VIEW COMPARISON:  01/29/2016 FINDINGS: There is a nondisplaced lateral patellar fracture, unchanged.  Moderate joint effusion, slightly decreased. No additional acute bony abnormality. Joint spaces are maintained. IMPRESSION: Stable nondisplaced lateral patellar fracture. Fracture line remains evident. Moderate joint effusion. Electronically Signed   By: Rolm Baptise M.D.   On: 02/05/2016 09:37    Assessment and Plan :   This case was precepted with Dr. Carlota Raspberry.   1. Closed nondisplaced fracture of right patella with routine healing, unspecified fracture morphology, subsequent encounter 2. Right knee pain, unspecified chronicity - Stable, showing some improvement. We will contact ortho to establish whether or not this is a vertical fracture. In the meantime, patient will continue to use immobilizer, crutches. Follow up in 4 weeks. Consider PT prior to that depending on what ortho recommends.  [name], PA-C [facility] [phone] 02/05/2016 9:07 AM    UPDATE - Per Dr. Carlota Raspberry, we will have patient start to perform light rehab without the immobilizer. He is to start trying light flexion of his right knee within his pain tolerance. Wear the immobilizer for moving around outside the home. Recheck in 1 week instead of 2 to evaluate his progress and consider referral to physical therapy at that point. Patient verbalized understanding.

## 2016-02-12 ENCOUNTER — Ambulatory Visit (INDEPENDENT_AMBULATORY_CARE_PROVIDER_SITE_OTHER): Payer: BLUE CROSS/BLUE SHIELD | Admitting: Physician Assistant

## 2016-02-12 ENCOUNTER — Encounter: Payer: Self-pay | Admitting: Physician Assistant

## 2016-02-12 VITALS — BP 104/60 | HR 112 | Temp 98.5°F | Resp 16 | Ht 71.0 in | Wt 178.4 lb

## 2016-02-12 DIAGNOSIS — Z23 Encounter for immunization: Secondary | ICD-10-CM | POA: Diagnosis not present

## 2016-02-12 DIAGNOSIS — M25561 Pain in right knee: Secondary | ICD-10-CM

## 2016-02-12 DIAGNOSIS — S82024D Nondisplaced longitudinal fracture of right patella, subsequent encounter for closed fracture with routine healing: Secondary | ICD-10-CM

## 2016-02-12 DIAGNOSIS — S82001A Unspecified fracture of right patella, initial encounter for closed fracture: Secondary | ICD-10-CM | POA: Insufficient documentation

## 2016-02-12 MED ORDER — MELOXICAM 7.5 MG PO TABS: 7.5000 mg | ORAL_TABLET | Freq: Every day | ORAL | 0 refills | Status: DC

## 2016-02-12 MED ORDER — OXYCODONE-ACETAMINOPHEN 5-325 MG PO TABS: 1.0000 | ORAL_TABLET | Freq: Three times a day (TID) | ORAL | 0 refills | Status: DC | PRN

## 2016-02-12 NOTE — Patient Instructions (Addendum)
Stop taking Norco.  Please take one Oxycodone every 8 hours for pain. If this is not reducing your pain, you can increase it to two every 8 hours. You can take Mobic at the same time. Mobic is an anti-inflammatory and will not interact with Oxycodone.  Please initiate your home exercises several times a day. As soon as your pain is controlled, we will get you into physical therapy. I would like to get you in to PT as soon as possible, hopefully in the next week.  Please communicate with me how your pain level is. The more your pain level is controlled, the better you will feel in Pt.   PT will call you to schedule an appointment. As discussed, we will make sure your pain level is controlled before you attend.   Please be sure to wear your knee immobilizer when you are out of your house.   Thank you for coming in today. I hope you feel we met your needs.  Feel free to call [facility] if you have any questions or further requests.  Please consider signing up for MyChart if you do not already have it, as this is a great way to communicate with me.  Best,  Whitney McVey, PA-C  IF you received an x-ray today, you will receive an invoice from [facility]. Please contact [facility] at [phone] with questions or concerns regarding your invoice.   IF you received labwork today, you will receive an invoice from [employer]. Please contact [facility] at [phone] with questions or concerns regarding your invoice.   Our billing staff will not be able to assist you with questions regarding bills from these companies.  You will be contacted with the lab results as soon as they are available. The fastest way to get your results is to activate your My Chart account. Instructions are located on the last page of this paperwork. If you have not heard from [place] regarding the results in 2 weeks, please contact this office.

## 2016-02-12 NOTE — Progress Notes (Signed)
Ruben [name]  MRN: [id number] DOB: December 14, 1945  PCP: No PCP Per Patient  Subjective:  Pt is a 70 year old male who presents to clinic for follow-up patellar fracture. He fell three weeks ago and came to [facility] for treatment.   At his last appointment one week ago he was advised to start light flexion of his right knee as tolerated without the knee immobilizer. He was instructed to continue using immobilizer outside his home. Consideration for PT referral today.    He reports that his pain is not controlled with Norco. He has been taking it as prescribed. Not taking Meloxicam as he was nervious the mediations would interact. He did one day of knee exercises but could not get his pain under control afterwards.  His pain occasionally radiates down his leg towards his ankle. Side to side movements hurt him the most. Bending and extending is not painful. He is having difficulty sleeping due to pain. No decreased range of motion.  He is still wearing his knee immoblizer at home and still using crutches.   Review of Systems  Constitutional: Negative for chills, fatigue and fever.  Respiratory: Negative for cough, chest tightness, shortness of breath and wheezing.   Cardiovascular: Negative for chest pain, palpitations and leg swelling.  Musculoskeletal: Positive for arthralgias and joint swelling.  Skin: Negative.     Patient Active Problem List   Diagnosis Date Noted  . HTN (hypertension) 02/21/2013  . Tobacco abuse 02/21/2013  . HLD (hyperlipidemia) 02/21/2013    Current Outpatient Prescriptions on File Prior to Visit  Medication Sig Dispense Refill  . acetaminophen (TYLENOL) 500 MG tablet Take 500 mg by mouth every 6 (six) hours as needed for pain.    Marland Kitchen allopurinol (ZYLOPRIM) 100 MG tablet Take 100 mg by mouth 2 (two) times daily.    Marland Kitchen aspirin 81 MG tablet Take 81 mg by mouth daily.    Marland Kitchen HYDROcodone-acetaminophen (NORCO) 5-325 MG tablet Take 1 tablet by mouth every 6 (six) hours  as needed for moderate pain. 12 tablet 0  . meloxicam (MOBIC) 7.5 MG tablet Take 1 tablet (7.5 mg total) by mouth daily. 30 tablet 0  . naproxen sodium (ANAPROX) 220 MG tablet Take 220 mg by mouth 2 (two) times daily as needed.    Marland Kitchen olmesartan-hydrochlorothiazide (BENICAR HCT) 40-25 MG per tablet Take 1 tablet by mouth daily.    . predniSONE (DELTASONE) 20 MG tablet Two daily with food 10 tablet 0  . simvastatin (ZOCOR) 40 MG tablet TAKE 1 TABLET BY MOUTH EVERY NIGHT AT BEDTIME 90 tablet 1  . tadalafil (CIALIS) 10 MG tablet Take 10 mg by mouth daily as needed for erectile dysfunction.    . traMADol (ULTRAM) 50 MG tablet Take 1 tablet (50 mg total) by mouth every 12 (twelve) hours as needed. 30 tablet 0  . Cholecalciferol (VITAMIN D3) 5000 UNITS CAPS Take 1 capsule by mouth daily.    . fenofibrate (TRICOR) 145 MG tablet Take 145 mg by mouth daily.    . Garlic 3151 MG CAPS Take 1,000 mg by mouth daily.     No current facility-administered medications on file prior to visit.     No Known Allergies  Objective:  BP 104/60 (BP Location: Left Arm, Patient Position: Sitting, Cuff Size: Normal)   Pulse (!) 112   Temp 98.5 F (36.9 C) (Oral)   Resp 16   Ht 5\' 11"  (1.803 m)   Wt 178 lb 6.4 oz (80.9 kg)  SpO2 96%   BMI 24.88 kg/m   Physical Exam  Constitutional: He is oriented to person, place, and time and well-developed, well-nourished, and in no distress. No distress.  Cardiovascular: Normal rate, regular rhythm and normal heart sounds.   Musculoskeletal:       Right knee: He exhibits swelling. He exhibits normal range of motion, no erythema and normal alignment.  Neurological: He is alert and oriented to person, place, and time. GCS score is 15.  Skin: Skin is warm and dry.  Psychiatric: Mood, memory, affect and judgment normal.  Vitals reviewed.   Assessment and Plan :  1. Right knee pain, unspecified chronicity 2. Closed nondisplaced longitudinal fracture of right patella with  routine healing, subsequent encounter - oxyCODONE-acetaminophen (ROXICET) 5-325 MG tablet; Take 1 tablet by mouth every 8 (eight) hours as needed for severe pain.  Dispense: 30 tablet; Refill: 0 - meloxicam (MOBIC) 7.5 MG tablet; Take 1 tablet (7.5 mg total) by mouth daily.  Dispense: 30 tablet; Refill: 0 - Ambulatory referral to Physical Therapy - Discussed at length with patient importance of pain control. Advised patient to discontinue Norco and start Oxycodone and Mobic. Plan to start PT as long as pain is controlled. Conitnue to wrap with ace wrap and apply ice as needed. Patient understands and agrees with plan.   3. Need for Tdap vaccination - Tdap vaccine greater than or equal to 7yo IM   Mercer Pod, PA-C  [facility] 02/12/2016 8:56 AM

## 2016-02-14 ENCOUNTER — Telehealth: Payer: Self-pay

## 2016-02-14 NOTE — Telephone Encounter (Signed)
Thank you. Spoke to patient. His pain is well controlled and he made an appointment with physical therapy.

## 2016-02-14 NOTE — Telephone Encounter (Signed)
Pt has called twice today to talk with mcvey-he will only tell me that she told him to call back in 48 hours of the time that she saw him and that she would be talking to him directly  Best number [phone]

## 2016-02-15 ENCOUNTER — Other Ambulatory Visit: Payer: Self-pay | Admitting: Physician Assistant

## 2016-02-15 ENCOUNTER — Telehealth: Payer: Self-pay

## 2016-02-15 DIAGNOSIS — M25561 Pain in right knee: Secondary | ICD-10-CM | POA: Insufficient documentation

## 2016-02-15 MED ORDER — OXYCODONE-ACETAMINOPHEN 5-325 MG PO TABS: 1.0000 | ORAL_TABLET | Freq: Three times a day (TID) | ORAL | 0 refills | Status: DC | PRN

## 2016-02-15 NOTE — Telephone Encounter (Signed)
Spoke with pt to inform him that prescription was ready. He verbalized understanding.

## 2016-02-15 NOTE — Progress Notes (Signed)
At his most recent visit, change in therapy deemed necessary as Norco was providing no relief from his knee pain even with dose changes. Ruben [name] was instructed to d/c Norco and start 5mg  Roxicet. His pain was still not controlled. A dose change to 10mg  finally provided relief. He is able to walk without crutches for the first time since his fracture. Now that his pain is controlled, he will start physical therapy. Ruben [name] understands Roxicet is temporary and he is not to drive or drink alcohol while taking on this medication.

## 2016-02-19 ENCOUNTER — Encounter: Payer: Self-pay | Admitting: Cardiovascular Disease

## 2016-02-19 ENCOUNTER — Ambulatory Visit (INDEPENDENT_AMBULATORY_CARE_PROVIDER_SITE_OTHER): Payer: BLUE CROSS/BLUE SHIELD

## 2016-02-19 ENCOUNTER — Ambulatory Visit (INDEPENDENT_AMBULATORY_CARE_PROVIDER_SITE_OTHER): Payer: BLUE CROSS/BLUE SHIELD | Admitting: Family Medicine

## 2016-02-19 ENCOUNTER — Ambulatory Visit (INDEPENDENT_AMBULATORY_CARE_PROVIDER_SITE_OTHER): Payer: BLUE CROSS/BLUE SHIELD | Admitting: Cardiovascular Disease

## 2016-02-19 VITALS — BP 90/60 | HR 118 | Temp 99.0°F | Resp 17 | Ht 70.0 in | Wt 181.0 lb

## 2016-02-19 VITALS — BP 111/70 | HR 108 | Ht 70.0 in | Wt 180.0 lb

## 2016-02-19 DIAGNOSIS — Z23 Encounter for immunization: Secondary | ICD-10-CM | POA: Diagnosis not present

## 2016-02-19 DIAGNOSIS — M10061 Idiopathic gout, right knee: Secondary | ICD-10-CM | POA: Diagnosis not present

## 2016-02-19 DIAGNOSIS — S82024D Nondisplaced longitudinal fracture of right patella, subsequent encounter for closed fracture with routine healing: Secondary | ICD-10-CM

## 2016-02-19 DIAGNOSIS — M1711 Unilateral primary osteoarthritis, right knee: Secondary | ICD-10-CM | POA: Diagnosis not present

## 2016-02-19 DIAGNOSIS — Z720 Tobacco use: Secondary | ICD-10-CM | POA: Diagnosis not present

## 2016-02-19 DIAGNOSIS — E7800 Pure hypercholesterolemia, unspecified: Secondary | ICD-10-CM

## 2016-02-19 DIAGNOSIS — I10 Essential (primary) hypertension: Secondary | ICD-10-CM | POA: Diagnosis not present

## 2016-02-19 MED ORDER — ZOSTER VACCINE LIVE 19400 UNT/0.65ML ~~LOC~~ SUSR: 0.6500 mL | Freq: Once | SUBCUTANEOUS | 0 refills | Status: AC

## 2016-02-19 MED ORDER — INDOMETHACIN 50 MG PO CAPS: 50.0000 mg | ORAL_CAPSULE | Freq: Three times a day (TID) | ORAL | 0 refills | Status: DC | PRN

## 2016-02-19 MED ORDER — OXYCODONE-ACETAMINOPHEN 10-325 MG PO TABS: 1.0000 | ORAL_TABLET | ORAL | 0 refills | Status: DC | PRN

## 2016-02-19 NOTE — Patient Instructions (Signed)
Medication Instructions:  NO CHANGES.   Follow-Up: We request that you follow-up in: 3 MONTHS with an extender and in 6 MONTHS with Dr Berry  You will receive a reminder letter in the mail two months in advance. If you don't receive a letter, please call our office to schedule the follow-up appointment.   If you need a refill on your cardiac medications before your next appointment, please call your pharmacy.   

## 2016-02-19 NOTE — Progress Notes (Signed)
02/19/2016 Ruben [name]   [birth year]  [id number]  Primary Physician No PCP Per Patient Primary Cardiologist: Ruben Harp MD Renae Gloss  HPI:  Mr. Downs is a 70 year old married African-American male father 2, grandfather 77 grandchildren who is referred back by his PCP for an abnormal EKG. I last saw him in the office 08/22/12. He was seen by Ruben Craw PA-C 02/21/13. He has a history of ongoing tobacco abuse, hypertension and hyperlipidemia. He does have family history of heart disease brother who died of a myocardial infarction age 43. He denies chest pain or shortness of breath. His EKG in our office shows sinus tachycardia 108 with PACs.   Current Outpatient Prescriptions  Medication Sig Dispense Refill  . acetaminophen (TYLENOL) 500 MG tablet Take 500 mg by mouth every 6 (six) hours as needed for pain.    Marland Kitchen allopurinol (ZYLOPRIM) 100 MG tablet Take 100 mg by mouth 2 (two) times daily.    Marland Kitchen aspirin 81 MG tablet Take 81 mg by mouth daily.    . meloxicam (MOBIC) 7.5 MG tablet Take 1 tablet (7.5 mg total) by mouth daily. 30 tablet 0  . naproxen sodium (ANAPROX) 220 MG tablet Take 220 mg by mouth 2 (two) times daily as needed.    Marland Kitchen olmesartan-hydrochlorothiazide (BENICAR HCT) 40-25 MG per tablet Take 1 tablet by mouth daily.    Marland Kitchen oxyCODONE-acetaminophen (ROXICET) 5-325 MG tablet Take 1 tablet by mouth every 8 (eight) hours as needed for severe pain. 30 tablet 0  . predniSONE (DELTASONE) 20 MG tablet Two daily with food 10 tablet 0  . simvastatin (ZOCOR) 40 MG tablet TAKE 1 TABLET BY MOUTH EVERY NIGHT AT BEDTIME 90 tablet 1  . tadalafil (CIALIS) 10 MG tablet Take 10 mg by mouth daily as needed for erectile dysfunction.    . traMADol (ULTRAM) 50 MG tablet Take 1 tablet (50 mg total) by mouth every 12 (twelve) hours as needed. 30 tablet 0   No current facility-administered medications for this visit.     No Known Allergies  Social History   Social  History  . Marital status: Single    Spouse name: N/A  . Number of children: N/A  . Years of education: N/A   Occupational History  . Not on file.   Social History Main Topics  . Smoking status: Current Every Day Smoker    Packs/day: 1.50    Years: 20.00  . Smokeless tobacco: Never Used  . Alcohol use No  . Drug use: No  . Sexual activity: Not on file   Other Topics Concern  . Not on file   Social History Narrative  . No narrative on file     Review of Systems: General: negative for chills, fever, night sweats or weight changes.  Cardiovascular: negative for chest pain, dyspnea on exertion, edema, orthopnea, palpitations, paroxysmal nocturnal dyspnea or shortness of breath Dermatological: negative for rash Respiratory: negative for cough or wheezing Urologic: negative for hematuria Abdominal: negative for nausea, vomiting, diarrhea, bright red blood per rectum, melena, or hematemesis Neurologic: negative for visual changes, syncope, or dizziness All other systems reviewed and are otherwise negative except as noted above.    Blood pressure 111/70, pulse (!) 108, height 5\' 10"  (1.778 m), weight 180 lb (81.6 kg).  General appearance: alert and no distress Neck: no adenopathy, no carotid bruit, no JVD, supple, symmetrical, trachea midline and thyroid not enlarged, symmetric, no tenderness/mass/nodules Lungs: clear to auscultation bilaterally Heart:  regular rate and rhythm, S1, S2 normal, no murmur, click, rub or gallop Extremities: extremities normal, atraumatic, no cyanosis or edema  EKG sinus tachycardia at 108 with PACs. I personally reviewed this EKG  ASSESSMENT AND PLAN:   HTN (hypertension) History of hypertension blood pressure measures 111/70. He is on Benicar and hydrochlorothiazide. Continue current meds at current dosing  Tobacco abuse History of continued tobacco abuse although better than in the past. He was smoking one to one and a half packs a day now he  smoking 2 packs per week.  HLD (hyperlipidemia) History of hyperlipidemia on statin therapy followed by his PCP      Ruben Harp MD [facility], [facility] 02/19/2016 10:31 AM

## 2016-02-19 NOTE — Assessment & Plan Note (Signed)
History of hypertension blood pressure measures 111/70. He is on Benicar and hydrochlorothiazide. Continue current meds at current dosing

## 2016-02-19 NOTE — Patient Instructions (Addendum)
Do not use the pain medicine before you walk/we don't want you to overdo it. You are just 1 month out from your fracture so hopefully in another 2 weeks we can get you out of the knee immobilizer and then hopefully will be healed another month after that (so by the end of November getting you back to normal activity level.)  Recheck in another 2 weeks (on Halloween) with Whitney, Dr. Carlota Raspberry, or our sports medicine fellows (usually available on Mon morning and Tuesday afternoon).  I think it is possible that you are having some gout flair in your joint as it is so tender, red, warm and so taking indomethacin several times a day might work better than the oxycodone (and CAN be used together.).  When you use the indomethacine, do not use the meloxicam, naproxen, or any other otc pain medication other than tylenol/acetaminophen - so no aleve, ibuprofen, motrin, advil, etc.    IF you received an x-ray today, you will receive an invoice from [facility]. Please contact [facility] at [phone] with questions or concerns regarding your invoice.   IF you received labwork today, you will receive an invoice from [employer]. Please contact [facility] at [phone] with questions or concerns regarding your invoice.   Our billing staff will not be able to assist you with questions regarding bills from these companies.   You will be contacted with the lab results as soon as they are available. The fastest way to get your results is to activate your My Chart account. Instructions are located on the last page of this paperwork. If you have not heard from [place] regarding the results in 2 weeks, please contact this office.     Patellar Fracture With Rehab Patellar fracture is a break in the kneecap (patella). These fractures may be complete or incomplete. It is common for injuries that cause a patellar fracture to also cause a tear (sprain) of the ligaments or  ligament-like tissue (retinaculum), or a tear (strain) in the patellar tendon. SYMPTOMS   Severe pain in the knee at the time of injury.  Tenderness and swelling in the knee.  Pain with movement of the knee.  Inability to straighten a bent knee by its own power.  Catching or locking of the knee.  Bleeding and bruising (contusion) in the knee.  Difficulty bearing weight on the injured limb, especially when trying to get up from a sitting position, go up or down stairs, or jump.  Visible deformity, if the bone fragments are out of alignment (displaced).  Numbness and coldness in the leg and foot beyond the fracture site, if the blood supply is impaired. CAUSES  Patellar fractures occur when a force is placed on the kneecap that is greater than it can handle. Common causes of injury include:  Direct trauma to the patella.  Indirect stress caused by twisting or bending. RISK INCREASES WITH:   Contact sports (football, hockey, soccer).  Basketball.  Motor sports.  Bony abnormalities or diseases of the bone (osteoporosis, bone tumor).  Metabolism disorders, hormone problems, nutrition deficiencies and disorders (anorexia or bulimia).  Poor strength and flexibility. PREVENTION  Warm up and stretch properly before activity.  Maintain physical fitness:  Strength, flexibility, and endurance.  Cardiovascular fitness.  Wear properly fitted and padded protective equipment (knee pads). PROGNOSIS  If treated properly, patellar fractures usually heal.  RELATED COMPLICATIONS   Fracture fails to heal (nonunion).  Fracture heals in a poor position (malunion).  Bone  death (necrosis), due to interruption of blood supply to the bone.  Stopping of normal bone growth in children.  Risks of surgery: infection, bleeding, injury to nerves (numbness, weakness, paralysis), need for further surgery, and pain from the wires or screws used to fix the fracture.  Infection in the skin,  broken over the fracture site (open fractures).  Arthritic knee joint.  Longer healing time, if activity is resumed too soon.  Proneness to repeated knee injury.  After healing, risk of roughened contact surface of the kneecap, causing pain with sitting, when rising from a sitting position, when going up or down stairs or hills, and when jumping or running.  Stiff knee.  Unstable kneecap. TREATMENT  Treatment first involves the use of ice and medicine to reduce pain and inflammation. If the bone fragments are out of alignment (displaced), immediate realigning of the bone (reduction) by a person trained in the procedure is required. Fractures that cannot be realigned by hand, or are open (bones protrude through the skin), may require surgery to hold the fracture in place with screws, pins, and plates. Once the bone is in proper alignment, restraining the knee is often needed to allow for healing. After restraint, it is important to perform strengthening and stretching exercises to help regain strength and a full range of motion. These exercises may be completed at home or with a therapist. MEDICATION   If pain medicine is needed, nonsteroidal anti-inflammatory medicines (aspirin and ibuprofen), or other minor pain relievers (acetaminophen), are often advised.  Do not take pain medicine for 7 days before surgery.  Prescription pain relievers may be given if your caregiver thinks they are needed. Use only as directed and only as much as you need. SEEK MEDICAL CARE IF:  Symptoms get worse or do not improve in 2 weeks, despite treatment.  The following occur after restraint or surgery. (Report any of these signs immediately):  Swelling above or below the fracture site.  Severe, persistent pain.  Blue or gray skin below the fracture site or in the toes. Numbness or loss of feeling below the fracture site.  New, unexplained symptoms develop. (Drugs used in treatment may produce side  effects.) EXERCISES RANGE OF MOTION (ROM) AND STRETCHING EXERCISES - Patellar Fracture Your fractured patella may be corrected with surgery and/or with a knee brace. Upon your caregiver's instructions, you may begin your rehabilitation with some of the following exercises. As you complete these, remember:   These initial exercises are intended to be gentle. They will help you restore motion without increasing any swelling.  Completing these exercises allows less painful movement and prepares you for the more aggressive strengthening exercises in Phase II.  An effective stretch should be held for at least 30 seconds.  A stretch should never be painful. You should only feel a gentle lengthening or release in the stretched tissue. RANGE OF MOTION - Knee Flexion and Extension, Active-Assisted  Sit on the edge of a table or chair with your thighs firmly supported. It may be helpful to place a folded towel under the end of your right / left thigh.  Flexion (bending): Place the ankle of your healthy leg on top of the other ankle. Use your healthy leg to gently bend your right / left knee until you feel a mild tension across the top of your knee.  Hold for __________ seconds.  Extension (straightening): Switch your ankles so your right / left leg is on top. Use your healthy leg to straighten  your right / left knee until you feel a mild tension on the backside of your knee.  Hold for __________ seconds. Repeat __________ times. Complete this exercise __________ times per day. RANGE OF MOTION - Knee Flexion, Active  Lie on your back with both knees straight. (If this causes back discomfort, bend your opposite knee, placing your foot flat on the floor.)  Slowly slide your heel back toward your buttocks until you feel a gentle stretch in the front of your knee or thigh.  Hold for __________ seconds. Slowly slide your heel back to the starting position. Repeat __________ times. Complete this  exercise __________ times per day.  STRETCH - Knee Flexion, Supine  Lie on the floor with your right / left heel and foot lightly touching the wall. (Place both feet on the wall if you do not use a door frame.)  Without using any effort, allow gravity to slide your foot down the wall slowly until you feel a gentle stretch in the front of your right / left knee.  Hold this stretch for __________ seconds. Then return the leg to the starting position, using your health leg for help, if needed. Repeat __________ times. Complete this stretch __________ times per day.  STRETCH - Knee Extension Sitting  Sit with your right / left leg/heel propped on another chair, coffee table, or foot stool.  Allow your leg muscles to relax, letting gravity straighten out your knee.*  You should feel a stretch behind your right / left knee. Hold this position for __________ seconds. Repeat __________ times. Complete this stretch __________ times per day.  *Your physician, physical therapist or athletic trainer may instruct you place a __________ weight on your thigh, just above your kneecap, to deepen the stretch.  STRETCH - Knee Extension, Prone  Lie on your stomach on a firm surface, such as a bed or countertop. Place your right / left knee and leg just beyond the edge of the surface. You may wish to place a towel under the far end of your right / left thigh for comfort.  Relax your leg muscles and allow gravity to straighten your knee. Your caregiver may advise you to add an ankle weight if more resistance is helpful for you.  You should feel a stretch in the back of your right / left knee. Hold this position for __________ seconds. Repeat __________ times. Complete this __________ times per day. *Your physician, physical therapist or athletic trainer may instruct you place a __________ weight on your ankle to deepen the stretch.  STRENGTHENING EXERCISES - Patellar Fracture  Regardless of whether your  physician has stabilized your knee with surgery or a brace, he or she may have you begin some of the following exercises to rehabilitate your injury. The exercises may resolve your symptoms with or without further involvement from your physician, physical therapist, or athletic trainer. While completing these exercises, remember:   Muscles can gain both the endurance and the strength needed for everyday activities through controlled exercises.  Complete these exercises as instructed by your physician, physical therapist, or athletic trainer. Increase the resistance and repetitions only as guided by your caregiver. STRENGTH - Quadriceps, Isometrics  Lie on your back with your right / left leg extended and your opposite knee bent.  Gradually tense the muscles in the front of your right / left thigh. You should see either your kneecap slide up toward your hip or increased dimpling just above the knee. This motion will push the  back of the knee down toward the floor, mat, or bed on which you are lying.  Hold the muscle as tight as you can, without increasing your pain, for __________ seconds.  Relax the muscles slowly and completely in between each repetition. Repeat __________ times. Complete this exercise __________ times per day.  STRENGTH - Quadriceps, Short Arcs  Lie on your back. Place a __________ inch towel roll under your right / left knee, so that the knee bends slightly.  Raise only your lower leg by tightening the muscles in the front of your thigh. Do not allow your thigh to rise.  Hold this position for __________ seconds. Repeat __________ times. Complete this exercise __________ times per day.  OPTIONAL ANKLE WEIGHTS: Begin with ____________________, but DO NOT exceed ____________________. Increase in 1 lb/0.5 kg increments.  STRENGTH - Quadriceps, Straight Leg Raises Quality counts! Watch for signs that the quadriceps muscle is working, to be sure you are strengthening the  correct muscles and not "cheating" by substituting with healthier muscles.  Lay on your back with your right / left leg extended and your opposite knee bent.  Tense the muscles in the front of your right / left thigh. You should see either your kneecap slide up or increased dimpling just above the knee. Your thigh may even shake a bit.  Tighten these muscles even more and raise your leg 4 to 6 inches off the floor. Hold for __________ seconds.  Keeping these muscles tense, lower your leg.  Relax the muscles slowly and completely between each repetition. Repeat __________ times. Complete this exercise __________ times per day.  STRENGTH - Hamstring, Isometrics  Lie on your back on a firm surface.  Bend your right / left knee approximately __________ degrees.  Dig your heel into the surface as if you are trying to pull it toward your buttocks. Tighten the muscles in the back of your thighs to "dig" as hard as you can without increasing any pain.  Hold this position for __________ seconds.  Release the tension gradually and allow your muscle to completely relax for __________ seconds between each exercise. Repeat __________ times. Complete this exercise __________ times per day.  STRENGTH - Hamstring, Curls  Lay on your stomach with your legs extended. (If you lay on a bed, your feet may hang over the edge.)  Tighten the muscles in the back of your thigh to bend your right / left knee up to 90 degrees. Keep your hips flat on the bed.  Hold this position for __________ seconds.  Slowly lower your leg back to the starting position. Repeat __________ times. Complete this exercise __________ times per day.  STRENGTH - Hip Extensors, Bridge  Lie on your back on a firm surface. Bend your knees and place your feet flat on the floor.  Tighten your buttocks muscles and lift your bottom off the floor until your trunk is level with your thighs. You should feel the muscles in your buttocks and  back of your thighs working. If you do not feel these muscles, slide your feet 1-2 inches further away from your buttocks.  Hold this position for __________ seconds.  Slowly lower your hips to the starting position and allow your buttock muscles relax completely before beginning the next repetition.  If this exercise is too easy, you may cross your arms over your chest. Repeat __________ times. Complete this exercise __________ times per day.  STRENGTH - Hip Abductors, Straight Leg Raises Be aware of your form  throughout the entire exercise, so that you exercise the correct muscles. Poor form means that you are not strengthening the correct muscles.  Lie on your side so that your head, shoulders, knee and hip line up. You may bend your lower knee to help maintain your balance. Your right / left leg should be on top.  Roll your hips slightly forward, so that your hips are stacked directly over each other and your right / left knee is facing forward.  Lift your top leg up 4 to 6 inches, leading with your heel. Be sure that your foot does not drift forward or that your knee does not roll toward the ceiling.  Hold this position for __________ seconds. You should feel the muscles in your outer hip lifting. (You may not notice this until your leg begins to tire.)  Slowly lower your leg to the starting position. Allow the muscles to fully relax before beginning the next repetition. Repeat __________ times. Complete this exercise __________ times per day.  STRENGTH - Hip Abductors, Quadruped  On a firm, lightly padded surface, position yourself on your hands and knees. Your hands should be directly below your shoulders and your knees should be directly below your hips.  Keeping your right / left knee bent, lift your leg out to the side. Keep your legs level and in line with your shoulders.  Hold for __________ seconds.  Keeping your trunk steady and your hips level, slowly lower your leg to the  starting position. Repeat __________ times. Complete this exercise __________ times per day.  STRENGTH - Quadriceps, Squats  Stand in a door frame so that your feet and knees are in line with the frame.  Use your hands for balance, not support, on the frame.  Slowly lower your weight, bending at the hips and knees. Keep your lower legs upright so that they are parallel with the door frame. Squat only within the range that does not increase your knee pain. Never let your hips drop below your knees.  Slowly return upright, pushing with your legs, not pulling with your hands. Repeat __________ times. Complete this exercise __________ times per day.  STRENGTH - Plantar-flexors, Standing  Stand with your feet shoulder width apart. Steady yourself with a wall or table, using as little support as needed.  Keeping your weight evenly spread over the width of your feet, rise up on your toes.*  Hold this position for __________ seconds. Repeat exercise __________ times, __________ times per day.  *If this is too easy, shift your weight toward your right / left leg until you feel challenged. Ultimately, you may be asked to do this exercise while standing on your right / left foot only.   This information is not intended to replace advice given to you by your health care provider. Make sure you discuss any questions you have with your health care provider.   Document Released: 04/21/2005 Document Revised: 09/05/2014 Document Reviewed: 08/03/2008 [employer] Interactive Patient Education [employer].

## 2016-02-19 NOTE — Progress Notes (Signed)
Subjective:  By signing my [name] below, I, Ruben [name], attest that this documentation has been prepared under the direction and in the presence of [name], MD. Electronically Signed: Moises [name], [place]. 02/19/2016 , 1:01 PM .  Patient was seen in Room 1 .   Patient ID: Ruben [name], male    DOB: Jun 26, 1945, 70 y.o.   MRN: [id number] Chief Complaint  Patient presents with  . Follow-up    Rt knee   HPI [name] is a 70 y.o. male who presents to [facility] for follow up of right patellar fracture. This is patient's 5th visit for right patellar fracture. He was first seen by [name] 1 month prior on 9/19. He had tripped and fell while walking downstairs over a loose step the evening before. His PCP is at [facility]. He was made non-weight bearing knee in ace wrap, and leg immobilizer, which was instructed to leave on at all times and essentially leg casted with use of crutches; motrin for pain.   Patient was rechecked in 1 week and his fracture remained nondisplaced, so he was continued with conservative management with knee immobilizer. Weight bearing was well-tolerated. At next visit, he was to begin quad strengthening exercises. Then 2 weeks in follow up, patient was advanced to oxycodone from hydrocodone for pain control, as well as mobic. He was continued in ace wrap and referred to physical therapy. Last xray was 02/05/16, when patient was followed up by [name], it showed stable non displaced lateral patellar fracture.   ~~~  Patient states he tried taking off the bandage and tried to weight bear without crutches a week ago. He was able to walk from his bedroom to the kitchen without crutches for a couple of times. He then wrapped it back up. After rewrapping it, he tried to weight bear without crutches and wrap again, only to find his "leg stiff as a board" with intolerable pain.   He mentions wrapping his leg up when his swelling starts back  up. His swelling has improved overall. When he twists or turns his right leg, he feels a shooting pain throughout his entire right knee, mostly when walking.   He has appointment with physical therapy on 10/26 at [facility]. As for his pain control, he had relief from the first dose of oxycodone. However, he doesn't have any relief even with 2 tablets. He denies having relief from hydrocodone, either. He denies any other pain medications in the past. He Ruben mention having a prednisone injection in the past for his gout flares for relief.   Past Medical History:  Diagnosis Date  . Arthritis   . History of stress test 09/2008   showed inferolateral scar without ischemia  . Hx of echocardiogram 09/2008   was essentially normal  . Hyperlipidemia   . Hypertension    Prior to Admission medications   Medication Sig Start Date End Date Taking? Authorizing Provider  acetaminophen (TYLENOL) 500 MG tablet Take 500 mg by mouth every 6 (six) hours as needed for pain.   Yes Historical Provider, MD  allopurinol (ZYLOPRIM) 100 MG tablet Take 100 mg by mouth 2 (two) times daily.   Yes Historical Provider, MD  aspirin 81 MG tablet Take 81 mg by mouth daily.   Yes Historical Provider, MD  meloxicam (MOBIC) 7.5 MG tablet Take 1 tablet (7.5 mg total) by mouth daily. 02/12/16  Yes [name], PA-C  naproxen sodium (ANAPROX) 220 MG tablet  Take 220 mg by mouth 2 (two) times daily as needed.   Yes Historical Provider, MD  olmesartan-hydrochlorothiazide (BENICAR HCT) 40-25 MG per tablet Take 1 tablet by mouth daily.   Yes Historical Provider, MD  oxyCODONE-acetaminophen (ROXICET) 5-325 MG tablet Take 1 tablet by mouth every 8 (eight) hours as needed for severe pain. 02/15/16  Yes [name], PA-C  predniSONE (DELTASONE) 20 MG tablet Two daily with food 09/14/15  Yes [name], FNP  simvastatin (ZOCOR) 40 MG tablet TAKE 1 TABLET BY MOUTH EVERY NIGHT AT BEDTIME   Yes [name], MD   tadalafil (CIALIS) 10 MG tablet Take 10 mg by mouth daily as needed for erectile dysfunction.   Yes Historical Provider, MD  traMADol (ULTRAM) 50 MG tablet Take 1 tablet (50 mg total) by mouth every 12 (twelve) hours as needed. 03/21/14  Yes [name], DO   No Known Allergies  Review of Systems  Constitutional: Positive for fatigue. Negative for activity change, chills, diaphoresis and fever.  Musculoskeletal: Positive for arthralgias, gait problem, joint swelling and myalgias. Negative for back pain.  Skin: Negative for rash and wound.  Neurological: Negative for dizziness, weakness, light-headedness and numbness.       Objective:   Physical Exam  Constitutional: He is oriented to person, place, and time. He appears well-developed and well-nourished. No distress.  HENT:  Head: Normocephalic and atraumatic.  Eyes: EOM are normal. Pupils are equal, round, and reactive to light.  Neck: Neck supple.  Cardiovascular: Normal rate.   Pulmonary/Chest: Effort normal. No respiratory distress.  Musculoskeletal: Normal range of motion.  Right knee: moderate effusion with severe tenderness and slight warmth over his right patellar; no tenderness over the medial joint line, no tenderness over the popliteal, positive pain over the posterior lateral joint line  Neurological: He is alert and oriented to person, place, and time.  Skin: Skin is warm and dry.  Psychiatric: He has a normal mood and affect. His behavior is normal.  Nursing note and vitals reviewed.   BP 90/60 (BP Location: Right Arm, Patient Position: Sitting, Cuff Size: Normal)   Pulse (!) 118   Temp 99 F (37.2 C) (Oral)   Resp 17   Ht 5\' 10"  (1.778 m)   Wt 181 lb (82.1 kg)   SpO2 99%   BMI 25.97 kg/m    Dg Knee Complete 4 Views Right  Result Date: 02/19/2016 CLINICAL DATA:  Follow-up patellar fracture. EXAM: RIGHT KNEE - COMPLETE 4+ VIEW COMPARISON:  02/05/2016 FINDINGS: A nondisplaced lateral patellar fracture  is again noted with some interval healing since 01/22/2016 No new fracture, subluxation or dislocation identified. A joint effusion is again noted. No new abnormalities are present. IMPRESSION: Nondisplaced lateral patellar fracture with interval healing since 01/22/2016. Fracture line is still present. Knee effusion again noted. Electronically Signed   By: Margarette Canada M.D.   On: 02/19/2016 13:10   Dg Knee Complete 4 Views Right  Result Date: 02/05/2016 CLINICAL DATA:  Follow-up patella fracture EXAM: RIGHT KNEE - COMPLETE 4+ VIEW COMPARISON:  01/29/2016 FINDINGS: There is a nondisplaced lateral patellar fracture, unchanged. Moderate joint effusion, slightly decreased. No additional acute bony abnormality. Joint spaces are maintained. IMPRESSION: Stable nondisplaced lateral patellar fracture. Fracture line remains evident. Moderate joint effusion. Electronically Signed   By: Rolm Baptise M.D.   On: 02/05/2016 09:37       Assessment & Plan:   1. Closed nondisplaced longitudinal fracture of right patella with routine healing,  subsequent encounter   2. Acute idiopathic gout of right knee   3. Primary osteoarthritis of right knee   4. Need for prophylactic vaccination against Streptococcus pneumoniae (pneumococcus)    Suffered non-displaced patellar frx 1 mo prior.  Pt noting worsening of sxs when he tried to advance activity as was recommended.  Fortunately, xray continues to show healing. Rec NOT taking narcotic before activity so he does not unintentionally overdue it. Cont knee immobilizer Recheck in 2 wks and will hopefully be able to go out of knee immobilizer at that time but reminded pt it may be an additional 4-6 wks to completely heal.  Poss he is having a gout flair starting which is exacerbating the frx pain as knee is now more tender, red, and warm - he does have h/o gout.  Start indomethacin.  Orders Placed This Encounter  Procedures  . DG Knee Complete 4 Views Right    Standing  Status:   Future    Number of Occurrences:   1    Standing Expiration Date:   02/18/2017    Order Specific Question:   Reason for Exam (SYMPTOM  OR DIAGNOSIS REQUIRED)    Answer:   f/u patellar fracture, increased pain and swelling    Order Specific Question:   Preferred imaging location?    Answer:   External  . Pneumococcal conjugate vaccine 13-valent IM    Meds ordered this encounter  Medications  . oxyCODONE-acetaminophen (PERCOCET) 10-325 MG tablet    Sig: Take 1-2 tablets by mouth every 4 (four) hours as needed for pain.    Dispense:  80 tablet    Refill:  0  . indomethacin (INDOCIN) 50 MG capsule    Sig: Take 1 capsule (50 mg total) by mouth 3 (three) times daily as needed. With food    Dispense:  60 capsule    Refill:  0  . Zoster Vaccine Live, PF, (ZOSTAVAX) 75916 UNT/0.65ML injection    Sig: Inject 19,400 Units into the skin once.    Dispense:  1 each    Refill:  0    I personally performed the services described in this documentation, which was scribed in my presence. The recorded information has been reviewed and considered, and addended by me as needed.   [name], M.D.  [facility] [address] [place], [place] [address] [phone] phone [phone] fax  03/03/16 11:16 PM

## 2016-02-19 NOTE — Assessment & Plan Note (Signed)
History of continued tobacco abuse although better than in the past. He was smoking one to one and a half packs a day now he smoking 2 packs per week.

## 2016-02-19 NOTE — Assessment & Plan Note (Signed)
History of hyperlipidemia on statin therapy followed by his PCP 

## 2016-02-21 NOTE — Telephone Encounter (Signed)
Pt needs something to take along with pain medication because he is having bowel issues from pain medication he is not passing stool   Contact number is [phone]

## 2016-03-04 ENCOUNTER — Ambulatory Visit (INDEPENDENT_AMBULATORY_CARE_PROVIDER_SITE_OTHER): Payer: BLUE CROSS/BLUE SHIELD | Admitting: Physician Assistant

## 2016-03-04 VITALS — BP 104/66 | HR 98 | Temp 98.6°F | Resp 18 | Ht 70.0 in | Wt 181.0 lb

## 2016-03-04 DIAGNOSIS — K5900 Constipation, unspecified: Secondary | ICD-10-CM | POA: Diagnosis not present

## 2016-03-04 DIAGNOSIS — Z720 Tobacco use: Secondary | ICD-10-CM | POA: Diagnosis not present

## 2016-03-04 DIAGNOSIS — S82024D Nondisplaced longitudinal fracture of right patella, subsequent encounter for closed fracture with routine healing: Secondary | ICD-10-CM | POA: Diagnosis not present

## 2016-03-04 MED ORDER — POLYETHYLENE GLYCOL 3350 17 GM/SCOOP PO POWD: 17.0000 g | Freq: Two times a day (BID) | ORAL | 1 refills | Status: DC | PRN

## 2016-03-04 NOTE — Patient Instructions (Addendum)
For constipation   Make sure you are drinking enough water daily. Make sure you are getting enough fiber in your diet - this will make you regular - you can eat high fiber foods or use metamucil as a supplement - it is really important to drink enough water when using fiber supplements.  If your stools are hard or are formed balls or you have to strain a stool softener will help - use colace 2-3 capsule a day  For gentle treatment of constipation Use Miralax 1-2 capfuls a day until your stools are soft and regular and then decrease the usage - you can use this daily  For more aggressive treatment of constipation Use 4 capfuls of Colace and 6 doses of Miralax and drink it in 2 hours - this should result in several watery stools - if it does not repeat the next day and then go to daily miralax for a week to make sure your bowels are clean and retrained to work properly  For the most aggressive treatment of constipation Use 14 capfuls of Miralax in 1 gallon of fluid (gatoraid or water work well or a combination of the two) and drink over 12h - it is ok to eat during this time and then use Miralax 1 capful daily for about 2 weeks to prevent the constipation from returning   Smoking Cessation, Tips for Success --YOU CAN DO IT!!!!!!!!! If you are ready to quit smoking, congratulations! You have chosen to help yourself be healthier. Cigarettes bring nicotine, tar, carbon monoxide, and other irritants into your body. Your lungs, heart, and blood vessels will be able to work better without these poisons. There are many different ways to quit smoking. Nicotine gum, nicotine patches, a nicotine inhaler, or nicotine nasal spray can help with physical craving. Hypnosis, support groups, and medicines help break the habit of smoking. WHAT THINGS CAN I DO TO MAKE QUITTING EASIER?  Here are some tips to help you quit for good:  Pick a date when you will quit smoking completely. Tell all of your friends and  family about your plan to quit on that date.  Do not try to slowly cut down on the number of cigarettes you are smoking. Pick a quit date and quit smoking completely starting on that day.  Throw away all cigarettes.   Clean and remove all ashtrays from your home, work, and car.  On a card, write down your reasons for quitting. Carry the card with you and read it when you get the urge to smoke.  Cleanse your body of nicotine. Drink enough water and fluids to keep your urine clear or pale yellow. Do this after quitting to flush the nicotine from your body.  Learn to predict your moods. Do not let a bad situation be your excuse to have a cigarette. Some situations in your life might tempt you into wanting a cigarette.  Never have "just one" cigarette. It leads to wanting another and another. Remind yourself of your decision to quit.  Change habits associated with smoking. If you smoked while driving or when feeling stressed, try other activities to replace smoking. Stand up when drinking your coffee. Brush your teeth after eating. Sit in a different chair when you read the paper. Avoid alcohol while trying to quit, and try to drink fewer caffeinated beverages. Alcohol and caffeine may urge you to smoke.  Avoid foods and drinks that can trigger a desire to smoke, such as sugary or spicy foods and  alcohol.  Ask people who smoke not to smoke around you.  Have something planned to do right after eating or having a cup of coffee. For example, plan to take a walk or exercise.  Try a relaxation exercise to calm you down and decrease your stress. Remember, you may be tense and nervous for the first 2 weeks after you quit, but this will pass.  Find new activities to keep your hands busy. Play with a pen, coin, or rubber band. Doodle or draw things on paper.  Brush your teeth right after eating. This will help cut down on the craving for the taste of tobacco after meals. You can also try mouthwash.    Use oral substitutes in place of cigarettes. Try using lemon drops, carrots, cinnamon sticks, or chewing gum. Keep them handy so they are available when you have the urge to smoke.  When you have the urge to smoke, try deep breathing.  Designate your home as a nonsmoking area.  If you are a heavy smoker, ask your health care provider about a prescription for nicotine chewing gum. It can ease your withdrawal from nicotine.  Reward yourself. Set aside the cigarette money you save and buy yourself something nice.  Look for support from others. Join a support group or smoking cessation program. Ask someone at home or at work to help you with your plan to quit smoking.  Always ask yourself, "Do I need this cigarette or is this just a reflex?" Tell yourself, "Today, I choose not to smoke," or "I do not want to smoke." You are reminding yourself of your decision to quit.  Do not replace cigarette smoking with electronic cigarettes (commonly called e-cigarettes). The safety of e-cigarettes is unknown, and some may contain harmful chemicals.  If you relapse, do not give up! Plan ahead and think about what you will do the next time you get the urge to smoke. HOW WILL I FEEL WHEN I QUIT SMOKING? You may have symptoms of withdrawal because your body is used to nicotine (the addictive substance in cigarettes). You may crave cigarettes, be irritable, feel very hungry, cough often, get headaches, or have difficulty concentrating. The withdrawal symptoms are only temporary. They are strongest when you first quit but will go away within 10-14 days. When withdrawal symptoms occur, stay in control. Think about your reasons for quitting. Remind yourself that these are signs that your body is healing and getting used to being without cigarettes. Remember that withdrawal symptoms are easier to treat than the major diseases that smoking can cause.  Even after the withdrawal is over, expect periodic urges to smoke.  However, these cravings are generally short lived and will go away whether you smoke or not. Do not smoke! WHAT RESOURCES ARE AVAILABLE TO HELP ME QUIT SMOKING? Your health care provider can direct you to community resources or hospitals for support, which may include:  Group support.  Education.  Hypnosis.  Therapy.   This information is not intended to replace advice given to you by your health care provider. Make sure you discuss any questions you have with your health care provider.   Document Released: 01/18/2004 Document Revised: 05/12/2014 Document Reviewed: 10/07/2012 Elsevier Interactive Patient Education 2016 [employer].   IF you received an x-ray today, you will receive an invoice from [facility]. Please contact [facility] at [phone] with questions or concerns regarding your invoice.   IF you received labwork today, you will receive an invoice from [facility]. Please contact [facility] at [phone] with questions or concerns regarding your invoice.   Our billing staff will not be able to assist you with questions regarding bills from these companies.  You will be contacted with the lab results as soon as they are available. The fastest way to get your results is to activate your My Chart account. Instructions are located on the last page of this paperwork. If you have not heard from [place] regarding the results in 2 weeks, please contact this office.

## 2016-03-04 NOTE — Progress Notes (Signed)
MRN: [id number] DOB: 1945/07/29  Subjective:   Ruben [name] is a very pleasant 70 y.o. male presenting for follow up on right patellar fracture. This is his 6th visit for this complaint. He originally suffered a fall on 9/18 from a loose step and fell to the curb on his right knee. Xrays revealed a non-displaced vertical patellar fracture. He was placed in knee immobilizer, ace wrap and instructed to use crutches non-weight bearing.   His fracture has since remained stable, non-displaced. He has had a difficult time getting his pain under control with trials of Mobic, flexeril and Norco. He reports relief with Norco, however d/c'd it due to constipation. His last x-ray 10/17 showed fracture is still non-displaced.  Notes he is using his ace wrap as needed for swelling, but thinks he is wrapping it too tight, as his leg "feels numb" after he wraps it. He is using his knee immobilizer when mobile outside of his home.   Reports attending one physical therapy session. States he felt good doing the exercises, however has not been consistent with doing them at home, as he quickly becomes SOB, which he attributes this to smoking. Smokes 1 ppd out of bordome. His next PT session is this week. Notes his therapist advised him to attend three PT session a week for about one month.   Surafel has a current medication list which includes the following prescription(s): acetaminophen, allopurinol, aspirin, indomethacin, meloxicam, naproxen sodium, olmesartan-hydrochlorothiazide, oxycodone-acetaminophen, prednisone, simvastatin, tadalafil, and tramadol. Also has No Known Allergies.  Corian  has a past medical history of Arthritis; History of stress test (09/2008); echocardiogram (09/2008); Hyperlipidemia; and Hypertension. Also  has a past surgical history that includes Back surgery; Hernia repair; and Spine surgery.  Objective:   Vitals: BP 104/66 (BP Location: Right Arm, Patient Position: Sitting,  Cuff Size: Small)   Pulse 98   Temp 98.6 F (37 C) (Oral)   Resp 18   Ht 5\' 10"  (1.778 m)   Wt 181 lb (82.1 kg)   SpO2 99%   BMI 25.97 kg/m   Physical Exam  Constitutional: He is oriented to person, place, and time. He appears well-developed and well-nourished. No distress.  Cardiovascular: Normal rate, regular rhythm, normal heart sounds and intact distal pulses.   Pulmonary/Chest: Effort normal. No respiratory distress.  Musculoskeletal:       Right knee: He exhibits normal range of motion, no effusion, no deformity and normal alignment. Tenderness found. Medial joint line tenderness noted.  Neurological: He is alert and oriented to person, place, and time.  Skin: Skin is warm and dry. Capillary refill takes 2 to 3 seconds.  Psychiatric: He has a normal mood and affect. His behavior is normal. Judgment and thought content normal.  Vitals reviewed.   No results found for this or any previous visit (from the past 24 hour(s)).  Assessment and Plan :  1. Closed nondisplaced longitudinal fracture of right patella with routine healing, subsequent encounter 2. Tobacco abuse - Encouraged patient to cut back on smoking, as this will help with his stamina during physical therapy. Smoking cessation tips printed out for patient. Discussed with patient importance of physical therapy early in fracture management, as this will help him return to normal activity sooner. He understands and agrees, however seems reluctant to change his habits. He is willing to try.  - Encouraged patient to keep appointments three times a week with physical therapy. Follow-up in one month to hold him accountable for therapy and  smoking cessation. He agrees with plan.   3. Constipation, unspecified constipation type - polyethylene glycol powder (GLYCOLAX/MIRALAX) powder; Take 17 g by mouth 2 (two) times daily as needed.  Dispense: 578 g; Refill: 1 - Patient encouraged to take pain medication as needed, along with  Miralax. Patient instructed to not take pain medication prior to PT as this may impede his performance and balance. He understands and agrees.   Mercer Pod, PA-C  [facility] 03/04/2016 8:19 AM

## 2016-05-07 ENCOUNTER — Telehealth: Payer: Self-pay

## 2016-05-07 ENCOUNTER — Ambulatory Visit (INDEPENDENT_AMBULATORY_CARE_PROVIDER_SITE_OTHER): Payer: BLUE CROSS/BLUE SHIELD | Admitting: Physician Assistant

## 2016-05-07 VITALS — BP 120/66 | HR 105 | Temp 98.7°F | Resp 16 | Ht 70.0 in | Wt 179.4 lb

## 2016-05-07 DIAGNOSIS — S82024D Nondisplaced longitudinal fracture of right patella, subsequent encounter for closed fracture with routine healing: Secondary | ICD-10-CM

## 2016-05-07 DIAGNOSIS — Z720 Tobacco use: Secondary | ICD-10-CM

## 2016-05-07 DIAGNOSIS — Z716 Tobacco abuse counseling: Secondary | ICD-10-CM

## 2016-05-07 NOTE — Patient Instructions (Signed)
     IF you received an x-ray today, you will receive an invoice from [facility]. Please contact [facility] at [phone] with questions or concerns regarding your invoice.   IF you received labwork today, you will receive an invoice from [facility]. Please contact [facility] at [phone] with questions or concerns regarding your invoice.   Our billing staff will not be able to assist you with questions regarding bills from these companies.  You will be contacted with the lab results as soon as they are available. The fastest way to get your results is to activate your My Chart account. Instructions are located on the last page of this paperwork. If you have not heard from us regarding the results in 2 weeks, please contact this office.     

## 2016-05-07 NOTE — Telephone Encounter (Signed)
Patient came in for an appointment and was told about his balance of $82.96, patient states he sent a payment in through the mail for this amount--I let him know that if we do not receive this payment for $82.96 he would not be able to be seen again until it was paid.  Patient was very rude to me when I tried to explain our policy about having balance paid. I tried to explain it two times and he would not listen to.

## 2016-05-07 NOTE — Progress Notes (Signed)
Ruben [name]  MRN: [id number] DOB: [birth year]  PCP: No PCP Per Patient  Subjective:  Pt is a pleasant 71 year old male who presents to clinic for f/u right patellar fracture. Initial OV for this c/c was 01/22/2016, x-rays showed non-displaced right patellar fracture. Since that time he had difficulty with pain control and constipation.  He was able to find relief with Norco. He received 4 weeks of physical therapy, 2-3 times a week. His physical therapy reports state he met all of his goals. He was released from therapy with no further f/u.  Today he "feels 100% better", he had a wonderful experience with physical therapy and plans to continue his exercises by joining a gym. C/o stiffness in the morning when he wakes up, which improves through the morning. He is not taking anything for pain at this time. No longer using a cane or crutches. Denies pain with walking, bending, flexion, twisting. Denies numbness, tingling, weakness.   He is working on his smoking cessation, currently smokes 1/2 pack/day. This is down from a whole pack/day.   Review of Systems  Constitutional: Negative for chills and diaphoresis.  Respiratory: Negative for cough, chest tightness, shortness of breath and wheezing.   Cardiovascular: Negative for chest pain, palpitations and leg swelling.  Gastrointestinal: Negative for diarrhea, nausea and vomiting.  Musculoskeletal: Negative for arthralgias, gait problem, joint swelling, myalgias and neck pain.  Neurological: Negative for dizziness, syncope, light-headedness and headaches.  Psychiatric/Behavioral: Negative for sleep disturbance. The patient is not nervous/anxious.     Patient Active Problem List   Diagnosis Date Noted  . Right knee pain 02/15/2016  . Right patella fracture 02/12/2016  . HTN (hypertension) 02/21/2013  . Tobacco abuse 02/21/2013  . HLD (hyperlipidemia) 02/21/2013    Current Outpatient Prescriptions on File Prior to Visit  Medication  Sig Dispense Refill  . acetaminophen (TYLENOL) 500 MG tablet Take 500 mg by mouth every 6 (six) hours as needed for pain.    Marland Kitchen allopurinol (ZYLOPRIM) 100 MG tablet Take 100 mg by mouth 2 (two) times daily.    Marland Kitchen aspirin 81 MG tablet Take 81 mg by mouth daily.    . indomethacin (INDOCIN) 50 MG capsule Take 1 capsule (50 mg total) by mouth 3 (three) times daily as needed. With food 60 capsule 0  . meloxicam (MOBIC) 7.5 MG tablet Take 1 tablet (7.5 mg total) by mouth daily. 30 tablet 0  . naproxen sodium (ANAPROX) 220 MG tablet Take 220 mg by mouth 2 (two) times daily as needed.    Marland Kitchen olmesartan-hydrochlorothiazide (BENICAR HCT) 40-25 MG per tablet Take 1 tablet by mouth daily.    Marland Kitchen oxyCODONE-acetaminophen (PERCOCET) 10-325 MG tablet Take 1-2 tablets by mouth every 4 (four) hours as needed for pain. 80 tablet 0  . polyethylene glycol powder (GLYCOLAX/MIRALAX) powder Take 17 g by mouth 2 (two) times daily as needed. 578 g 1  . predniSONE (DELTASONE) 20 MG tablet Two daily with food 10 tablet 0  . simvastatin (ZOCOR) 40 MG tablet TAKE 1 TABLET BY MOUTH EVERY NIGHT AT BEDTIME 90 tablet 1  . tadalafil (CIALIS) 10 MG tablet Take 10 mg by mouth daily as needed for erectile dysfunction.    . traMADol (ULTRAM) 50 MG tablet Take 1 tablet (50 mg total) by mouth every 12 (twelve) hours as needed. 30 tablet 0   No current facility-administered medications on file prior to visit.     No Known Allergies   Objective:  BP 120/66  Pulse (!) 105   Temp 98.7 F (37.1 C) (Oral)   Resp 16   Ht 5' 10"  (1.778 m)   Wt 179 lb 6.4 oz (81.4 kg)   SpO2 99%   BMI 25.74 kg/m   Physical Exam  Constitutional: He is oriented to person, place, and time and well-developed, well-nourished, and in no distress. No distress.  Cardiovascular: Normal rate, regular rhythm and normal heart sounds.   Musculoskeletal:       Right knee: He exhibits normal range of motion, no swelling, no effusion, no ecchymosis, no erythema,  normal alignment and no bony tenderness. No tenderness found.  Neurological: He is alert and oriented to person, place, and time. GCS score is 15.  Skin: Skin is warm and dry.  Psychiatric: Mood, memory, affect and judgment normal.  Vitals reviewed.   Assessment and Plan :  1. Closed nondisplaced longitudinal fracture of right patella with routine healing, subsequent encounter - Patient has successfully completed 4 weeks of physical therapy. The PT report states he has met his targeted goal and displays improved strength and balance. Pt has no complaints. 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 Encouraged continued exercising and strengthening. RTC as needed.   2. Encounter for smoking cessation counseling - Discussed smoking cessation tips. Encouraged continued efforts to cut back.     Mercer Pod, PA-C  [facility] 05/07/2016 9:45 AM

## 2016-05-20 ENCOUNTER — Ambulatory Visit: Payer: BLUE CROSS/BLUE SHIELD | Admitting: Physician Assistant

## 2016-05-27 ENCOUNTER — Encounter: Payer: Self-pay | Admitting: Physician Assistant

## 2016-05-27 ENCOUNTER — Ambulatory Visit (INDEPENDENT_AMBULATORY_CARE_PROVIDER_SITE_OTHER): Payer: BLUE CROSS/BLUE SHIELD | Admitting: Physician Assistant

## 2016-05-27 VITALS — BP 122/76 | HR 92 | Ht 71.0 in | Wt 181.4 lb

## 2016-05-27 DIAGNOSIS — I10 Essential (primary) hypertension: Secondary | ICD-10-CM | POA: Diagnosis not present

## 2016-05-27 DIAGNOSIS — R9439 Abnormal result of other cardiovascular function study: Secondary | ICD-10-CM

## 2016-05-27 DIAGNOSIS — Z720 Tobacco use: Secondary | ICD-10-CM | POA: Diagnosis not present

## 2016-05-27 DIAGNOSIS — E7800 Pure hypercholesterolemia, unspecified: Secondary | ICD-10-CM

## 2016-05-27 NOTE — Progress Notes (Signed)
Cardiology Office Note   Date:  05/27/2016   ID:  Ruben [name], DOB 24-Jun-1945, MRN [id number]  PCP:  No PCP Per Patient  Cardiologist:  Dr Gwenlyn Found 02/19/2016  Rosaria Ferries, PA-C   Chief Complaint  Patient presents with  . Hypertension  . Follow-up    History of Present Illness: Ruben [name] is a 71 y.o. male with a history of HTN, HLD, tob use, abnormal Myoview 2010, FH CAD  Ruben [name] presents for cardiology follow up  He quit smoking at the first of the year.   He is compliant with his BP meds, admits he does not always eat right. He is trying to do better. He did rehab after his patellar fx and liked it. He wants to join the [facility] He states he has lost a little weight and is trying to get himself healthier. He feels he is making some positive lifestyle changes.  He has not had any chest pain with exertion. He denies dyspnea on exertion, orthopnea or PND. He coughs occasionally. He does not hear himself wheeze.  He gets occasional pain in his left leg and wonders if the PAT versus diabetic nerve pain. The pain is a shooting pain. It is sometimes just in his right great toe. At other times, it is in his lower leg. It seems to start near his knee and go down the inside of his leg towards his foot. He is not feeling this now. He feels that occasionally but it is not exertional and resolves without intervention.   Past Medical History:  Diagnosis Date  . Arthritis   . History of stress test 09/2008   showed inferolateral scar without ischemia  . Hx of echocardiogram 09/2008   was essentially normal  . Hyperlipidemia   . Hypertension     Past Surgical History:  Procedure Laterality Date  . BACK SURGERY     Dr Luiz Ochoa involving L3-L4 discectomy.  Marland Kitchen HERNIA REPAIR    . SPINE SURGERY      Current Outpatient Prescriptions  Medication Sig Dispense Refill  . acetaminophen (TYLENOL) 500 MG tablet Take 500 mg by mouth every 6 (six) hours as  needed for pain.    Marland Kitchen allopurinol (ZYLOPRIM) 100 MG tablet Take 100 mg by mouth 2 (two) times daily.    Marland Kitchen aspirin 81 MG tablet Take 81 mg by mouth daily.    . cholecalciferol (VITAMIN D) 1000 units tablet Take 3,000 Units by mouth daily.    Marland Kitchen olmesartan-hydrochlorothiazide (BENICAR HCT) 40-25 MG per tablet Take 1 tablet by mouth daily.    Marland Kitchen oxyCODONE-acetaminophen (PERCOCET) 10-325 MG tablet Take 1-2 tablets by mouth every 4 (four) hours as needed for pain. 80 tablet 0  . polyethylene glycol powder (GLYCOLAX/MIRALAX) powder Take 17 g by mouth 2 (two) times daily as needed. 578 g 1  . predniSONE (DELTASONE) 20 MG tablet Two daily with food 10 tablet 0  . simvastatin (ZOCOR) 40 MG tablet TAKE 1 TABLET BY MOUTH EVERY NIGHT AT BEDTIME 90 tablet 1  . tadalafil (CIALIS) 10 MG tablet Take 10 mg by mouth daily as needed for erectile dysfunction.    . traMADol (ULTRAM) 50 MG tablet Take 1 tablet (50 mg total) by mouth every 12 (twelve) hours as needed. 30 tablet 0  . indomethacin (INDOCIN) 50 MG capsule Take 1 capsule (50 mg total) by mouth 3 (three) times daily as needed. With food (Patient not taking: Reported on 05/27/2016) 60 capsule 0  .  meloxicam (MOBIC) 7.5 MG tablet Take 1 tablet (7.5 mg total) by mouth daily. (Patient not taking: Reported on 05/27/2016) 30 tablet 0  . naproxen sodium (ANAPROX) 220 MG tablet Take 220 mg by mouth 2 (two) times daily as needed.     No current facility-administered medications for this visit.     Allergies:   Patient has no known allergies.    Social History:  The patient  reports that he has been smoking.  He has a 30.00 pack-year smoking history. He has never used smokeless tobacco. He reports that he does not drink alcohol or use drugs.   Family History:  The patient's family history includes Cancer in his father; Diabetes in his brother and brother; Heart attack in his brother; Heart disease in his brother and brother.    ROS:  Please see the history of  present illness. All other systems are reviewed and negative.    PHYSICAL EXAM: VS:  BP 122/76 (BP Location: Right Arm, Patient Position: Sitting, Cuff Size: Normal)   Pulse 92   Ht 5\' 11"  (1.803 m)   Wt 181 lb 6.4 oz (82.3 kg)   BMI 25.30 kg/m  , BMI Body mass index is 25.3 kg/m. GEN: Well nourished, well developed, male in no acute distress  HEENT: normal for age  Neck: no JVD, no carotid bruit, no masses Cardiac: RRR; no murmur, no rubs, or gallops Respiratory: Decreased breath sounds bases bilaterally, normal work of breathing GI: soft, nontender, nondistended, + BS MS: no deformity or atrophy; no edema; distal pulses are 2+ in all 4 extremities   Skin: warm and dry, no rash Neuro:  Strength and sensation are intact Psych: euthymic mood, full affect   EKG:  EKG is not ordered today.  ECHO: 09/25/2008 EF 50-55 percent, mild aortic valve sclerosis  MYOVIEW: 09/25/2008 A moderate perfusion defect is seen in the basal inferolateral and mid inferolateral regions, consistent with infarct/scar in the distribution of the left circumflex artery. No evidence of inducible ischemia EF is mildly reduced with a small region of lateral wall hypocontractility, EF 52% exaggerated BP response to exercise   Recent Labs: No results found for requested labs within last 8760 hours.    Lipid Panel No results found for: CHOL, TRIG, HDL, CHOLHDL, VLDL, LDLCALC, LDLDIRECT   Wt Readings from Last 3 Encounters:  05/27/16 181 lb 6.4 oz (82.3 kg)  05/07/16 179 lb 6.4 oz (81.4 kg)  03/04/16 181 lb (82.1 kg)     Other studies Reviewed: Additional studies/ records that were reviewed today include: Office notes and testing.  ASSESSMENT AND PLAN:  1.  Hypertension: His blood pressure is good today. He is not having any symptoms related to high blood pressure. No medication changes  2. Hyperlipidemia: He is trying to eat better, and states he is compliant with his Zocor. His lipids are followed  by his PCP.  3. Tobacco use: He quit at the first of the year and a New Year's resolution and has not started back. He is encouraged to continue this.  4. Abnormal Myoview: He is not having any ongoing ischemic symptoms. He is on baby aspirin and a statin. If he develops symptoms, further testing is indicated. For now, continue current therapy.  5. Heart healthy lifestyle: When he had to do rehabilitation for his patellar fracture, he realized he enjoyed getting the exercise and feeling more fit. He is encouraged to continue this. He is also encouraged to eat healthier.   Current medicines  are reviewed at length with the patient today.  The patient does not have concerns regarding medicines.  The following changes have been made:  no change  Labs/ tests ordered today include:  No orders of the defined types were placed in this encounter.    Disposition:   FU with Dr. Gwenlyn Found in a year  Signed, Lenoard Aden  05/27/2016 8:37 AM    [place] Phone: [phone]; Fax: [phone]  This note was written with the assistance of speech recognition software. Please excuse any transcriptional errors.

## 2016-05-27 NOTE — Patient Instructions (Signed)
Your physician recommends that you continue on your current medications as directed. Please refer to the Current Medication list given to you today.   Your physician recommends that you schedule a follow-up appointment in: 1 YEAR with Dr. Gwenlyn Found

## 2016-06-13 DIAGNOSIS — S82024D Nondisplaced longitudinal fracture of right patella, subsequent encounter for closed fracture with routine healing: Secondary | ICD-10-CM

## 2016-07-19 NOTE — Progress Notes (Signed)
Subjective:    Patient ID: Ruben [name], male    DOB: 23-Jan-1946, 71 y.o.   MRN: [id number] Chief Complaint  Patient presents with  . Shoulder Pain    Stiffness x6-7weeks. Back of neck and arm.    HPI  Ruben [name] is a 71 yo male who is here for pain in his neck, left shoulder, and radiating down to his left arm. After his fall several months ago he noticed a knot on her lateral thumb and then several weeks ago he developed intermittent pain at his left lateral epicondyle and now for the past 6-7 weeks he has had pain radiating all the way up his posterior shoulder and posterior neck. No h/o any neck, shoulder, or arm injuries or pain prior. No weakness.  occ tingling in left arm but rare.  He has normal range of motion.  Most pain happens at rest but better when laying down.  Is right-hand dominant.   Tried a couple of percocet and didn't help at all.  H/o gout on allopurinol 136m bid On simvastatin 40.  Quit smoking sincd 05/05/16!!!  Did PT with he knee and went well.  Also had some shoulder exercises he learned from you-tube very self-motivated.    His Cr was 1.19 with eGFR 71 on scanned in lab 12/2015 [facility]. Pt reports he just had his labs repeated last mo and Cr was 1.39 -> eGFR 59 hgba1c 12/2015 5.7  Past Medical History:  Diagnosis Date  . Arthritis   . History of stress test 09/2008   showed inferolateral scar without ischemia  . Hx of echocardiogram 09/2008   was essentially normal  . Hyperlipidemia   . Hypertension    Past Surgical History:  Procedure Laterality Date  . BACK SURGERY     Dr HLuiz Ochoainvolving L3-L4 discectomy.  .Marland KitchenHERNIA REPAIR    . SPINE SURGERY     Current Outpatient Prescriptions on File Prior to Visit  Medication Sig Dispense Refill  . acetaminophen (TYLENOL) 500 MG tablet Take 500 mg by mouth every 6 (six) hours as needed for pain.    .Marland Kitchenallopurinol (ZYLOPRIM) 100 MG tablet Take  100 mg by mouth 2 (two) times daily.    .Marland Kitchenaspirin 81 MG tablet Take 81 mg by mouth daily.    . cholecalciferol (VITAMIN D) 1000 units tablet Take 3,000 Units by mouth daily.    .Marland Kitchenolmesartan-hydrochlorothiazide (BENICAR HCT) 40-25 MG per tablet Take 1 tablet by mouth daily.    .Marland KitchenoxyCODONE-acetaminophen (PERCOCET) 10-325 MG tablet Take 1-2 tablets by mouth every 4 (four) hours as needed for pain. 80 tablet 0  . polyethylene glycol powder (GLYCOLAX/MIRALAX) powder Take 17 g by mouth 2 (two) times daily as needed. 578 g 1  . simvastatin (ZOCOR) 40 MG tablet TAKE 1 TABLET BY MOUTH EVERY NIGHT AT BEDTIME 90 tablet 1  . tadalafil (CIALIS) 10 MG tablet Take 10 mg by mouth daily as needed for erectile dysfunction.    . traMADol (ULTRAM) 50 MG tablet Take 1 tablet (50 mg total) by mouth every 12 (twelve) hours as needed. 30 tablet 0   No current facility-administered medications on file prior to visit.    No Known Allergies Family History  Problem Relation Age of Onset  . Heart attack Brother   . Diabetes Brother   . Heart disease Brother   . Cancer Father   . Diabetes Brother   . Heart disease  Brother    Social History   Social History  . Marital status: Single    Spouse name: N/A  . Number of children: N/A  . Years of education: N/A   Social History Main Topics  . Smoking status: Current Every Day Smoker    Packs/day: 1.50    Years: 20.00  . Smokeless tobacco: Never Used  . Alcohol use No  . Drug use: No  . Sexual activity: Not Asked   Other Topics Concern  . None   Social History Narrative  . None   Depression screen [facility] 2/9 07/21/2016 05/07/2016 03/04/2016 02/19/2016 02/12/2016  Decreased Interest 0 0 0 0 0  Down, Depressed, Hopeless 0 0 0 0 0  PHQ - 2 Score 0 0 0 0 0    Review of Systems  Constitutional: Negative for activity change, appetite change, chills and fever.  Gastrointestinal: Negative for abdominal pain, constipation and nausea.  Genitourinary: Negative for  decreased urine volume and hematuria.  Musculoskeletal: Positive for arthralgias, back pain, joint swelling, myalgias and neck stiffness. Negative for gait problem and neck pain.  Skin: Negative for color change, rash and wound.  Neurological: Negative for dizziness, tremors, facial asymmetry, weakness, light-headedness, numbness and headaches.  Psychiatric/Behavioral: Negative for sleep disturbance.       Objective:   Physical Exam  Constitutional: He is oriented to person, place, and time. He appears well-developed and well-nourished. No distress.  HENT:  Head: Normocephalic and atraumatic.  Eyes: No scleral icterus.  Pulmonary/Chest: Effort normal.  Musculoskeletal:       Cervical back: He exhibits decreased range of motion and spasm. He exhibits no tenderness, no bony tenderness, no swelling, no edema and no laceration.  Neurological: He is alert and oriented to person, place, and time. He has normal strength. He displays no atrophy. No sensory deficit. He exhibits normal muscle tone. Coordination normal.  Reflex Scores:      Tricep reflexes are 1+ on the right side and 1+ on the left side.      Bicep reflexes are 1+ on the right side and 1+ on the left side.      Brachioradialis reflexes are 1+ on the right side and 1+ on the left side. Skin: Skin is warm and dry. He is not diaphoretic.  Psychiatric: He has a normal mood and affect. His behavior is normal.  Limited ROM of cspine with extension but no pain.  Exam nml  BP 120/70   Pulse 94   Temp 98.8 F (37.1 C) (Oral)   Resp 18   Ht 5' 11"  (1.803 m)   Wt 179 lb (81.2 kg)   SpO2 98%   BMI 24.97 kg/m     Dg Cervical Spine 2 Or 3 Views  Result Date: 07/21/2016 CLINICAL DATA:  Pain.  Prior fall. EXAM: CERVICAL SPINE - 2-3 VIEW COMPARISON:  None. FINDINGS: Loss of normal cervical lordosis. Diffuse degenerative change. No evidence of fracture or dislocation. Bilateral vascular calcification noted. IMPRESSION: 1.Diffuse  degenerative change cervical spine. No acute abnormality identified. 2. Bilateral vascular calcification consistent with carotid atherosclerotic vascular disease. Electronically Signed   By: Marcello Moores  Register   On: 07/21/2016 11:33   Dg Shoulder Left  Result Date: 07/21/2016 CLINICAL DATA:  Left shoulder pain. EXAM: LEFT SHOULDER - 2+ VIEW COMPARISON:  08/25/2008 FINDINGS: Mild joint space narrowing and spurring at the left [facility] joint. Glenohumeral joint is maintained. No fracture, subluxation or dislocation. IMPRESSION: Mild degenerative changes at the left [facility] joint. No  acute bony abnormality. Electronically Signed   By: Rolm Baptise M.D.   On: 07/21/2016 11:25    Assessment & Plan:   1. Pain in joint of left shoulder   2. Cervicalgia   3. Radiculopathy of arm   Advised heat and then start on stretching/strengthening exercises which is a doing a god job of learning from PT on you-tube would like ot cont that - will call for PT referral if needed. Try pred taper with muscle relaxant since xrays ok.  Orders Placed This Encounter  Procedures  . DG Shoulder Left    Standing Status:   Future    Number of Occurrences:   1    Standing Expiration Date:   07/21/2017    Order Specific Question:   Reason for Exam (SYMPTOM  OR DIAGNOSIS REQUIRED)    Answer:   pain throughout left upper extrmity since fall, pain at posterior glenohumeral joint, decreased cspine ROM    Order Specific Question:   Preferred imaging location?    Answer:   External  . DG Cervical Spine 2 or 3 views    Standing Status:   Future    Number of Occurrences:   1    Standing Expiration Date:   07/21/2017    Order Specific Question:   Reason for Exam (SYMPTOM  OR DIAGNOSIS REQUIRED)    Answer:   pain throughout left upper extrmity since fall, pain at posterior glenohumeral joint, decreased cspine ROM    Order Specific Question:   Preferred imaging location?    Answer:   External   Meds ordered this encounter  Medications  .  predniSONE (DELTASONE) 10 MG tablet    Sig: 4 tabs po qd x 2d, 3 tabs po qd x 2d, 2 tabs po qd x 2d, 1 tab po qd x 2d    Dispense:  20 tablet    Refill:  0  . methocarbamol (ROBAXIN) 500 MG tablet    Sig: Take 1-2 tablets (500-1,000 mg total) by mouth every 8 (eight) hours as needed for muscle spasms.    Dispense:  60 tablet    Refill:  1      [name], M.D.  Primary Care at [facility] [address] [place], [place] [address] [phone] phone [phone] fax  07/22/16 3:54 PM

## 2016-07-21 ENCOUNTER — Ambulatory Visit (INDEPENDENT_AMBULATORY_CARE_PROVIDER_SITE_OTHER): Payer: BLUE CROSS/BLUE SHIELD | Admitting: Family Medicine

## 2016-07-21 ENCOUNTER — Ambulatory Visit (INDEPENDENT_AMBULATORY_CARE_PROVIDER_SITE_OTHER): Payer: BLUE CROSS/BLUE SHIELD

## 2016-07-21 ENCOUNTER — Encounter: Payer: Self-pay | Admitting: Family Medicine

## 2016-07-21 ENCOUNTER — Other Ambulatory Visit: Payer: Self-pay | Admitting: Family Medicine

## 2016-07-21 VITALS — BP 120/70 | HR 94 | Temp 98.8°F | Resp 18 | Ht 71.0 in | Wt 179.0 lb

## 2016-07-21 DIAGNOSIS — M10061 Idiopathic gout, right knee: Secondary | ICD-10-CM

## 2016-07-21 DIAGNOSIS — M5410 Radiculopathy, site unspecified: Secondary | ICD-10-CM

## 2016-07-21 DIAGNOSIS — M542 Cervicalgia: Secondary | ICD-10-CM | POA: Diagnosis not present

## 2016-07-21 DIAGNOSIS — M25512 Pain in left shoulder: Secondary | ICD-10-CM

## 2016-07-21 NOTE — Patient Instructions (Signed)
     IF you received an x-ray today, you will receive an invoice from [facility]. Please contact [facility] at [phone] with questions or concerns regarding your invoice.   IF you received labwork today, you will receive an invoice from [facility]. Please contact [facility] at [phone] with questions or concerns regarding your invoice.   Our billing staff will not be able to assist you with questions regarding bills from these companies.  You will be contacted with the lab results as soon as they are available. The fastest way to get your results is to activate your My Chart account. Instructions are located on the last page of this paperwork. If you have not heard from us regarding the results in 2 weeks, please contact this office.     

## 2016-07-22 ENCOUNTER — Encounter: Payer: Self-pay | Admitting: Family Medicine

## 2016-07-22 ENCOUNTER — Other Ambulatory Visit: Payer: Self-pay | Admitting: Family Medicine

## 2016-07-22 DIAGNOSIS — M10061 Idiopathic gout, right knee: Secondary | ICD-10-CM

## 2016-07-22 MED ORDER — METHOCARBAMOL 500 MG PO TABS: 500.0000 mg | ORAL_TABLET | Freq: Three times a day (TID) | ORAL | 1 refills | Status: DC | PRN

## 2016-07-22 MED ORDER — PREDNISONE 10 MG PO TABS: ORAL_TABLET | ORAL | 0 refills | Status: DC

## 2017-02-17 DIAGNOSIS — Z23 Encounter for immunization: Secondary | ICD-10-CM | POA: Diagnosis not present

## 2017-06-09 DIAGNOSIS — Q402 Other specified congenital malformations of stomach: Secondary | ICD-10-CM | POA: Insufficient documentation

## 2017-07-13 DIAGNOSIS — I1310 Hypertensive heart and chronic kidney disease without heart failure, with stage 1 through stage 4 chronic kidney disease, or unspecified chronic kidney disease: Secondary | ICD-10-CM | POA: Diagnosis not present

## 2017-07-13 DIAGNOSIS — R109 Unspecified abdominal pain: Secondary | ICD-10-CM | POA: Diagnosis not present

## 2017-07-13 DIAGNOSIS — N183 Chronic kidney disease, stage 3 (moderate): Secondary | ICD-10-CM | POA: Diagnosis not present

## 2017-08-10 ENCOUNTER — Ambulatory Visit (HOSPITAL_COMMUNITY)
Admission: RE | Admit: 2017-08-10 | Discharge: 2017-08-10 | Disposition: A | Discharge status: Home / Self Care | Payer: BLUE CROSS/BLUE SHIELD | Source: Ambulatory Visit | Attending: Emergency Medicine | Admitting: Emergency Medicine

## 2017-08-10 ENCOUNTER — Ambulatory Visit: Payer: BLUE CROSS/BLUE SHIELD | Admitting: Emergency Medicine

## 2017-08-10 ENCOUNTER — Other Ambulatory Visit: Payer: Self-pay

## 2017-08-10 DIAGNOSIS — R1031 Right lower quadrant pain: Secondary | ICD-10-CM | POA: Diagnosis not present

## 2017-08-10 DIAGNOSIS — R109 Unspecified abdominal pain: Secondary | ICD-10-CM | POA: Diagnosis not present

## 2017-08-10 DIAGNOSIS — R1011 Right upper quadrant pain: Secondary | ICD-10-CM | POA: Diagnosis not present

## 2017-08-10 MED ORDER — IOPAMIDOL (ISOVUE-300) INJECTION 61%
100.0000 mL | Freq: Once | INTRAVENOUS | Status: AC | PRN
  Administered 2017-08-10: 80 mL via INTRAVENOUS

## 2017-08-10 NOTE — Assessment & Plan Note (Signed)
No acute abdomen.  No red flag signs or symptoms.  Chronic pain of uncertain etiology.  CT scan of the abdomen unremarkable.  Will request a GI consult.  In the meantime patient advised to monitor symptoms and follow a diet rich in fruits and vegetables, avoid red meat, and stay well-hydrated.

## 2017-08-10 NOTE — Patient Instructions (Addendum)
     IF you received an x-ray today, you will receive an invoice from [facility]. Please contact [facility] at [phone] with questions or concerns regarding your invoice.   IF you received labwork today, you will receive an invoice from [facility]. Please contact [facility] at [phone] with questions or concerns regarding your invoice.   Our billing staff will not be able to assist you with questions regarding bills from these companies.  You will be contacted with the lab results as soon as they are available. The fastest way to get your results is to activate your My Chart account. Instructions are located on the last page of this paperwork. If you have not heard from us regarding the results in 2 weeks, please contact this office.      Abdominal Pain, Adult Many things can cause belly (abdominal) pain. Most times, belly pain is not dangerous. Many cases of belly pain can be watched and treated at home. Sometimes belly pain is serious, though. Your doctor will try to find the cause of your belly pain. Follow these instructions at home:  Take over-the-counter and prescription medicines only as told by your doctor. Do not take medicines that help you poop (laxatives) unless told to by your doctor.  Drink enough fluid to keep your pee (urine) clear or pale yellow.  Watch your belly pain for any changes.  Keep all follow-up visits as told by your doctor. This is important. Contact a doctor if:  Your belly pain changes or gets worse.  You are not hungry, or you lose weight without trying.  You are having trouble pooping (constipated) or have watery poop (diarrhea) for more than 2-3 days.  You have pain when you pee or poop.  Your belly pain wakes you up at night.  Your pain gets worse with meals, after eating, or with certain foods.  You are throwing up and cannot keep anything down.  You have a fever. Get help right away if:  Your pain does not go  away as soon as your doctor says it should.  You cannot stop throwing up.  Your pain is only in areas of your belly, such as the right side or the left lower part of the belly.  You have bloody or black poop, or poop that looks like tar.  You have very bad pain, cramping, or bloating in your belly.  You have signs of not having enough fluid or water in your body (dehydration), such as: ? Dark pee, very little pee, or no pee. ? Cracked lips. ? Dry mouth. ? Sunken eyes. ? Sleepiness. ? Weakness. This information is not intended to replace advice given to you by your health care provider. Make sure you discuss any questions you have with your health care provider. Document Released: 10/08/2007 Document Revised: 11/09/2015 Document Reviewed: 10/03/2015 Elsevier Interactive Patient Education  2018 Elsevier Inc.  

## 2017-08-10 NOTE — Progress Notes (Signed)
Ruben [name] [employer] 72 y.o.   Chief Complaint  Patient presents with  . Abdominal Pain    right side x 2 1/2 months, constipation comes and go, "stools are pencil like"    HISTORY OF PRESENT ILLNESS: This is a 72 y.o. male complaining of right-sided abdominal pain almost daily for the past 2 1/2 months.  Intermittent sharp pain not associated with any other significant symptoms.  Recent blood work done last month was normal.  That included a CBC and CMP.  Abdominal Pain  This is a new problem. The current episode started more than 1 month ago. The onset quality is gradual. The problem occurs intermittently. The problem has been waxing and waning. The pain is located in the RUQ. The pain is at a severity of 3/10. The pain is mild. The quality of the pain is colicky and aching. The abdominal pain does not radiate. Associated symptoms include constipation. Pertinent negatives include no anorexia, belching, diarrhea, dysuria, fever, frequency, headaches, hematochezia, hematuria, melena, myalgias, nausea, vomiting or weight loss. Nothing aggravates the pain. The pain is relieved by nothing. Prior workup: Colonoscopy 3 years ago showed benign polyps. There is no history of abdominal surgery, colon cancer, Crohn's disease, gallstones, GERD, pancreatitis, PUD or ulcerative colitis.     Prior to Admission medications   Medication Sig Start Date End Date Taking? Authorizing Provider  acetaminophen (TYLENOL) 500 MG tablet Take 500 mg by mouth every 6 (six) hours as needed for pain.   Yes [provider]  allopurinol (ZYLOPRIM) 100 MG tablet Take 100 mg by mouth 2 (two) times daily.   Yes [provider]  aspirin 81 MG tablet Take 81 mg by mouth daily.   Yes [provider]  cholecalciferol (VITAMIN D) 1000 units tablet Take 3,000 Units by mouth daily.   Yes [provider]  methocarbamol (ROBAXIN) 500 MG tablet Take 1-2 tablets (500-1,000 mg total) by mouth every 8  (eight) hours as needed for muscle spasms. 07/22/16  Yes Shawnee Knapp, MD  Naproxen Sodium (ALEVE PO) Take by mouth.   Yes [provider]  olmesartan-hydrochlorothiazide (BENICAR HCT) 40-25 MG per tablet Take 1 tablet by mouth daily.   Yes [provider]  polyethylene glycol powder (GLYCOLAX/MIRALAX) powder Take 17 g by mouth 2 (two) times daily as needed. 03/04/16  Yes McVey, Gelene Mink, PA-C  simvastatin (ZOCOR) 40 MG tablet TAKE 1 TABLET BY MOUTH EVERY NIGHT AT BEDTIME   Yes [name], MD  tadalafil (CIALIS) 10 MG tablet Take 10 mg by mouth daily as needed for erectile dysfunction.   Yes [provider]  traMADol (ULTRAM) 50 MG tablet Take 1 tablet (50 mg total) by mouth every 12 (twelve) hours as needed. 03/21/14  Yes Pick-Jacobs, John C, DO  oxyCODONE-acetaminophen (PERCOCET) 10-325 MG tablet Take 1-2 tablets by mouth every 4 (four) hours as needed for pain. Patient not taking: Reported on 08/10/2017 02/19/16   Shawnee Knapp, MD    No Known Allergies  Patient Active Problem List   Diagnosis Date Noted  . Right knee pain 02/15/2016  . Right patella fracture 02/12/2016  . HTN (hypertension) 02/21/2013  . Tobacco abuse 02/21/2013  . HLD (hyperlipidemia) 02/21/2013    Past Medical History:  Diagnosis Date  . Arthritis   . History of stress test 09/2008   showed inferolateral scar without ischemia  . Hx of echocardiogram 09/2008   was essentially normal  . Hyperlipidemia   . Hypertension  Past Surgical History:  Procedure Laterality Date  . BACK SURGERY     Dr Luiz Ochoa involving L3-L4 discectomy.  Marland Kitchen HERNIA REPAIR    . SPINE SURGERY      Social History   Socioeconomic History  . Marital status: Married    Spouse name: Not on file  . Number of children: Not on file  . Years of education: Not on file  . Highest education level: Not on file  Occupational History  . Not on file  Social Needs  . Financial resource strain: Not on  file  . Food insecurity:    Worry: Not on file    Inability: Not on file  . Transportation needs:    Medical: Not on file    Non-medical: Not on file  Tobacco Use  . Smoking status: Current Every Day Smoker    Packs/day: 1.50    Years: 20.00    Pack years: 30.00  . Smokeless tobacco: Never Used  Substance and Sexual Activity  . Alcohol use: No    Alcohol/week: 0.0 oz  . Drug use: No  . Sexual activity: Not on file  Lifestyle  . Physical activity:    Days per week: Not on file    Minutes per session: Not on file  . Stress: Not on file  Relationships  . Social connections:    Talks on phone: Not on file    Gets together: Not on file    Attends religious service: Not on file    Active member of club or organization: Not on file    Attends meetings of clubs or organizations: Not on file    Relationship status: Not on file  . Intimate partner violence:    Fear of current or ex partner: Not on file    Emotionally abused: Not on file    Physically abused: Not on file    Forced sexual activity: Not on file  Other Topics Concern  . Not on file  Social History Narrative  . Not on file    Family History  Problem Relation Age of Onset  . Heart attack Brother   . Diabetes Brother   . Heart disease Brother   . Cancer Father   . Diabetes Brother   . Heart disease Brother      Review of Systems  Constitutional: Negative for fever and weight loss.  HENT: Negative.  Negative for congestion, nosebleeds and sore throat.   Eyes: Negative.  Negative for blurred vision and double vision.  Respiratory: Negative.  Negative for cough and shortness of breath.   Cardiovascular: Negative.  Negative for chest pain, palpitations and leg swelling.  Gastrointestinal: Positive for abdominal pain and constipation. Negative for anorexia, blood in stool, diarrhea, heartburn, hematochezia, melena, nausea and vomiting.  Genitourinary: Negative for dysuria, frequency and hematuria.    Musculoskeletal: Negative for back pain, myalgias and neck pain.  Skin: Negative.  Negative for rash.  Neurological: Negative for dizziness and headaches.  Endo/Heme/Allergies: Negative.   All other systems reviewed and are negative.    Physical Exam  Constitutional: He is oriented to person, place, and time. He appears well-developed.  HENT:  Head: Normocephalic and atraumatic.  Nose: Nose normal.  Mouth/Throat: Oropharynx is clear and moist.  Eyes: Pupils are equal, round, and reactive to light. Conjunctivae and EOM are normal.  Neck: Normal range of motion. Neck supple. No thyromegaly present.  Cardiovascular: Normal rate, regular rhythm and normal heart sounds.  Pulmonary/Chest: Effort normal and breath  sounds normal.  Abdominal: Soft. Bowel sounds are normal. He exhibits no distension and no mass. There is no tenderness. There is no rebound and no guarding. No hernia.  Musculoskeletal: Normal range of motion. He exhibits no edema.  Lymphadenopathy:    He has no cervical adenopathy.  Neurological: He is alert and oriented to person, place, and time. No sensory deficit. He exhibits normal muscle tone.  Skin: Skin is warm and dry. Capillary refill takes less than 2 seconds. No rash noted.  Psychiatric: He has a normal mood and affect. His behavior is normal.  Vitals reviewed.  Ct Abdomen Pelvis W Contrast  Result Date: 08/10/2017 CLINICAL DATA:  Right-sided abdominal pain for 2.5 months. EXAM: CT ABDOMEN AND PELVIS WITH CONTRAST TECHNIQUE: Multidetector CT imaging of the abdomen and pelvis was performed using the standard protocol following bolus administration of intravenous contrast. CONTRAST:  10mL ISOVUE-300 IOPAMIDOL (ISOVUE-300) INJECTION 61% COMPARISON:  None. FINDINGS: Lower chest: Mild right middle lobe atelectasis. Lungs otherwise clear. Hepatobiliary: 14 mm hypodense, fluid attenuating left hepatic mass most consistent with a cyst with other smaller similar subcentimeter  lesions also likely reflecting cysts. No intrahepatic or extrahepatic biliary ductal dilatation. No gallstones, gallbladder wall thickening, or biliary dilatation. Pancreas: Unremarkable. No pancreatic ductal dilatation or surrounding inflammatory changes. Spleen: Normal in size without focal abnormality. Adrenals/Urinary Tract: Normal adrenal glands. 17 mm hypodense, fluid attenuating right interpolar renal mass most consistent with a cyst. 15 mm hypodense, fluid attenuating left interpolar renal mass most consistent with a cyst. 9 mm cyst in the posterior interpolar aspect of the left kidney. Mild bilateral perinephric stranding. No urolithiasis or obstructive uropathy. Bladder is normal. Stomach/Bowel: Focal distended loop of small bowel in the left mid abdomen with an air-fluid level and focal kinking of the bowel loops (image 61/series 2) may reflect adhesions, but there is no resulting bowel obstruction. No pneumatosis, pneumoperitoneum or portal venous gas. Vascular/Lymphatic: Normal caliber abdominal aorta with atherosclerosis. No lymphadenopathy. Reproductive: Prostate is unremarkable. Other: No abdominal wall hernia or abnormality. No abdominopelvic ascites. Musculoskeletal: Subchondral sclerosis of right superior femoral head most concerning for avascular necrosis. Subchondral sclerosis of the left superior femoral head most concerning for avascular necrosis. Degenerative disc disease with disc height loss at L4-5 and L5-S1. IMPRESSION: 1. No CT findings to explain the patient's right lower quadrant pain. Normal appendix. 2. No urolithiasis or obstructive uropathy. 3. Avascular necrosis of bilateral femoral heads. Electronically Signed   By: Kathreen Devoid   On: 08/10/2017 15:06    Right upper quadrant abdominal pain No acute abdomen.  No red flag signs or symptoms.  Chronic pain of uncertain etiology.  CT scan of the abdomen unremarkable.  Will request a GI consult.  In the meantime patient advised to  monitor symptoms and follow a diet rich in fruits and vegetables, avoid red meat, and stay well-hydrated.   ASSESSMENT & PLAN: Willim was seen today for abdominal pain.  Diagnoses and all orders for this visit:  Right upper quadrant abdominal pain -     CT Abdomen Pelvis W Contrast; Future -     Ambulatory referral to Gastroenterology    Patient Instructions       IF you received an x-ray today, you will receive an invoice from [facility]. Please contact [facility] at [phone] with questions or concerns regarding your invoice.   IF you received labwork today, you will receive an invoice from [facility]. Please contact [facility] at [phone] with questions or concerns regarding your  invoice.   Our billing staff will not be able to assist you with questions regarding bills from these companies.  You will be contacted with the lab results as soon as they are available. The fastest way to get your results is to activate your My Chart account. Instructions are located on the last page of this paperwork. If you have not heard from [place] regarding the results in 2 weeks, please contact this office.     Abdominal Pain, Adult Many things can cause belly (abdominal) pain. Most times, belly pain is not dangerous. Many cases of belly pain can be watched and treated at home. Sometimes belly pain is serious, though. Your doctor will try to find the cause of your belly pain. Follow these instructions at home:  Take over-the-counter and prescription medicines only as told by your doctor. Do not take medicines that help you poop (laxatives) unless told to by your doctor.  Drink enough fluid to keep your pee (urine) clear or pale yellow.  Watch your belly pain for any changes.  Keep all follow-up visits as told by your doctor. This is important. Contact a doctor if:  Your belly pain changes or gets worse.  You are not hungry, or you lose weight without trying.  You  are having trouble pooping (constipated) or have watery poop (diarrhea) for more than 2-3 days.  You have pain when you pee or poop.  Your belly pain wakes you up at night.  Your pain gets worse with meals, after eating, or with certain foods.  You are throwing up and cannot keep anything down.  You have a fever. Get help right away if:  Your pain does not go away as soon as your doctor says it should.  You cannot stop throwing up.  Your pain is only in areas of your belly, such as the right side or the left lower part of the belly.  You have bloody or black poop, or poop that looks like tar.  You have very bad pain, cramping, or bloating in your belly.  You have signs of not having enough fluid or water in your body (dehydration), such as: ? Dark pee, very little pee, or no pee. ? Cracked lips. ? Dry mouth. ? Sunken eyes. ? Sleepiness. ? Weakness. This information is not intended to replace advice given to you by your health care provider. Make sure you discuss any questions you have with your health care provider. Document Released: 10/08/2007 Document Revised: 11/09/2015 Document Reviewed: 10/03/2015 Elsevier Interactive Patient Education  2018 Elsevier Inc.      Agustina Caroli, MD [facility]

## 2017-08-11 ENCOUNTER — Encounter: Payer: Self-pay | Admitting: Gastroenterology

## 2017-08-17 DIAGNOSIS — R1011 Right upper quadrant pain: Secondary | ICD-10-CM | POA: Diagnosis not present

## 2017-09-15 DIAGNOSIS — N183 Chronic kidney disease, stage 3 (moderate): Secondary | ICD-10-CM | POA: Diagnosis not present

## 2017-09-22 DIAGNOSIS — I129 Hypertensive chronic kidney disease with stage 1 through stage 4 chronic kidney disease, or unspecified chronic kidney disease: Secondary | ICD-10-CM | POA: Diagnosis not present

## 2017-09-22 DIAGNOSIS — D631 Anemia in chronic kidney disease: Secondary | ICD-10-CM | POA: Diagnosis not present

## 2017-09-22 DIAGNOSIS — N183 Chronic kidney disease, stage 3 (moderate): Secondary | ICD-10-CM | POA: Diagnosis not present

## 2017-09-22 DIAGNOSIS — N2581 Secondary hyperparathyroidism of renal origin: Secondary | ICD-10-CM | POA: Diagnosis not present

## 2017-10-05 ENCOUNTER — Ambulatory Visit: Payer: BLUE CROSS/BLUE SHIELD | Admitting: Gastroenterology

## 2017-10-12 DIAGNOSIS — N183 Chronic kidney disease, stage 3 (moderate): Secondary | ICD-10-CM | POA: Diagnosis not present

## 2018-01-11 DIAGNOSIS — F17290 Nicotine dependence, other tobacco product, uncomplicated: Secondary | ICD-10-CM

## 2018-01-11 DIAGNOSIS — Z125 Encounter for screening for malignant neoplasm of prostate: Secondary | ICD-10-CM | POA: Diagnosis not present

## 2018-01-11 DIAGNOSIS — Z0000 Encounter for general adult medical examination without abnormal findings: Secondary | ICD-10-CM | POA: Diagnosis not present

## 2018-01-11 DIAGNOSIS — M2550 Pain in unspecified joint: Secondary | ICD-10-CM | POA: Diagnosis not present

## 2018-01-11 DIAGNOSIS — M109 Gout, unspecified: Secondary | ICD-10-CM | POA: Diagnosis not present

## 2018-01-11 DIAGNOSIS — Z23 Encounter for immunization: Secondary | ICD-10-CM | POA: Diagnosis not present

## 2018-01-11 DIAGNOSIS — E785 Hyperlipidemia, unspecified: Secondary | ICD-10-CM | POA: Diagnosis not present

## 2018-01-11 DIAGNOSIS — N183 Chronic kidney disease, stage 3 (moderate): Secondary | ICD-10-CM | POA: Diagnosis not present

## 2018-01-11 DIAGNOSIS — I1310 Hypertensive heart and chronic kidney disease without heart failure, with stage 1 through stage 4 chronic kidney disease, or unspecified chronic kidney disease: Secondary | ICD-10-CM | POA: Diagnosis not present

## 2018-04-16 ENCOUNTER — Other Ambulatory Visit: Payer: Self-pay | Admitting: Nurse Practitioner

## 2018-04-19 ENCOUNTER — Other Ambulatory Visit: Payer: Self-pay | Admitting: Nurse Practitioner

## 2018-04-19 DIAGNOSIS — I10 Essential (primary) hypertension: Secondary | ICD-10-CM

## 2018-04-19 MED ORDER — OLMESARTAN MEDOXOMIL-HCTZ 40-25 MG PO TABS
1.0000 | ORAL_TABLET | Freq: Every day | ORAL | 1 refills | Status: DC
Start: 2018-04-19 — End: 2018-09-07

## 2018-05-13 ENCOUNTER — Other Ambulatory Visit: Payer: Self-pay | Admitting: Nurse Practitioner

## 2018-06-11 ENCOUNTER — Other Ambulatory Visit: Payer: Self-pay | Admitting: Internal Medicine

## 2018-06-21 DIAGNOSIS — N183 Chronic kidney disease, stage 3 (moderate): Secondary | ICD-10-CM | POA: Diagnosis not present

## 2018-07-02 DIAGNOSIS — N2581 Secondary hyperparathyroidism of renal origin: Secondary | ICD-10-CM | POA: Diagnosis not present

## 2018-07-02 DIAGNOSIS — N183 Chronic kidney disease, stage 3 (moderate): Secondary | ICD-10-CM | POA: Diagnosis not present

## 2018-07-02 DIAGNOSIS — D631 Anemia in chronic kidney disease: Secondary | ICD-10-CM | POA: Diagnosis not present

## 2018-07-02 DIAGNOSIS — I129 Hypertensive chronic kidney disease with stage 1 through stage 4 chronic kidney disease, or unspecified chronic kidney disease: Secondary | ICD-10-CM | POA: Diagnosis not present

## 2018-07-12 ENCOUNTER — Encounter: Payer: Self-pay | Admitting: Nurse Practitioner

## 2018-07-12 ENCOUNTER — Ambulatory Visit: Payer: BLUE CROSS/BLUE SHIELD | Admitting: Nurse Practitioner

## 2018-07-12 VITALS — BP 138/72 | HR 84 | Ht 71.0 in | Wt 189.5 lb

## 2018-07-12 DIAGNOSIS — R7303 Prediabetes: Secondary | ICD-10-CM | POA: Diagnosis not present

## 2018-07-12 DIAGNOSIS — I10 Essential (primary) hypertension: Secondary | ICD-10-CM

## 2018-07-12 DIAGNOSIS — I129 Hypertensive chronic kidney disease with stage 1 through stage 4 chronic kidney disease, or unspecified chronic kidney disease: Secondary | ICD-10-CM | POA: Diagnosis not present

## 2018-07-12 DIAGNOSIS — N183 Chronic kidney disease, stage 3 unspecified: Secondary | ICD-10-CM | POA: Insufficient documentation

## 2018-07-12 NOTE — Progress Notes (Signed)
Subjective:     Patient ID: Ruben [name] , male    DOB: Sep 13, 1945 , 73 y.o.   MRN: [id number]   Chief Complaint  Patient presents with  . Hypertension    follow up     HPI  Hypertension  This is a chronic problem. The current episode started more than 1 year ago. The problem is unchanged. The problem is controlled. Pertinent negatives include no anxiety, chest pain, headaches, palpitations or shortness of breath. There are no associated agents to hypertension. Risk factors for coronary artery disease include obesity and sedentary lifestyle. Past treatments include diuretics and angiotensin blockers. There are no compliance problems.  There is no history of angina or kidney disease. There is no history of chronic renal disease.     Past Medical History:  Diagnosis Date  . Arthritis   . History of stress test 09/2008   showed inferolateral scar without ischemia  . Hx of echocardiogram 09/2008   was essentially normal  . Hyperlipidemia   . Hypertension      Family History  Problem Relation Age of Onset  . Heart attack Brother   . Diabetes Brother   . Heart disease Brother   . Cancer Father   . Diabetes Brother   . Heart disease Brother      Current Outpatient Medications:  .  acetaminophen (TYLENOL) 500 MG tablet, Take 500 mg by mouth every 6 (six) hours as needed for pain., Disp: , Rfl:  .  allopurinol (ZYLOPRIM) 100 MG tablet, TAKE 1 TABLET BY MOUTH TWICE DAILY, Disp: 60 tablet, Rfl: 0 .  aspirin 81 MG tablet, Take 81 mg by mouth daily., Disp: , Rfl:  .  methocarbamol (ROBAXIN) 500 MG tablet, Take 1-2 tablets (500-1,000 mg total) by mouth every 8 (eight) hours as needed for muscle spasms., Disp: 60 tablet, Rfl: 1 .  polyethylene glycol powder (GLYCOLAX/MIRALAX) powder, Take 17 g by mouth 2 (two) times daily as needed., Disp: 578 g, Rfl: 1 .  simvastatin (ZOCOR) 40 MG tablet, TAKE 1 TABLET BY MOUTH EVERY DAY IN THE EVENING, Disp: 90 tablet, Rfl: 0 .  tadalafil  (CIALIS) 10 MG tablet, Take 10 mg by mouth daily as needed for erectile dysfunction., Disp: , Rfl:  .  traMADol (ULTRAM) 50 MG tablet, Take 1 tablet (50 mg total) by mouth every 12 (twelve) hours as needed., Disp: 30 tablet, Rfl: 0 .  olmesartan-hydrochlorothiazide (BENICAR HCT) 40-25 MG tablet, Take 1 tablet by mouth daily., Disp: 90 tablet, Rfl: 1   No Known Allergies   Review of Systems  Constitutional: Negative.  Negative for fatigue.  Respiratory: Negative for cough and shortness of breath.   Cardiovascular: Negative for chest pain, palpitations and leg swelling.  Gastrointestinal: Negative.   Endocrine: Negative for polydipsia, polyphagia and polyuria.  Musculoskeletal: Negative for arthralgias and gait problem.  Skin: Negative.   Neurological: Negative for dizziness and headaches.     Today's Vitals   07/12/18 1045  BP: 138/72  Pulse: 84  SpO2: 97%  Weight: 189 lb 8 oz (86 kg)  Height: 5' 11" (1.803 m)   Body mass index is 26.43 kg/m.   Objective:  Physical Exam Constitutional:      Appearance: Normal appearance.  Cardiovascular:     Rate and Rhythm: Normal rate and regular rhythm.     Pulses: Normal pulses.     Heart sounds: Normal heart sounds. No murmur.  Skin:    General: Skin is warm and dry.  Capillary Refill: Capillary refill takes less than 2 seconds.  Neurological:     General: No focal deficit present.     Mental Status: He is oriented to person, place, and time.         Assessment And Plan:     1. Essential hypertension . B/P is controlled.  . CMP ordered to check renal function.  . The importance of regular exercise and dietary modification was stressed to the patient.  . Stressed importance of losing ten percent of her body weight to help with B/P control.  . The weight loss would help with decreasing cardiac and cancer risk as well.  - Lipid panel  2. Stage 3 chronic kidney disease ([place])  Continue follow up with Dr. Posey Pronto -  BMP8+eGFR  3. Prediabetes  Chronic, controlled  No current medications  Encouraged to limit intake of sugary foods and drinks  Encouraged to increase physical activity to 150 minutes per week - BMP8+eGFR - Hemoglobin A1c    [name], FNP

## 2018-07-13 LAB — LIPID PANEL
Chol/HDL Ratio: 3.4 ratio (ref 0.0–5.0)
Cholesterol, Total: 166 mg/dL (ref 100–199)
HDL: 49 mg/dL (ref >39)
LDL Calculated: 97 mg/dL (ref 0–99)
Triglycerides: 101 mg/dL (ref 0–149)
VLDL Cholesterol Cal: 20 mg/dL (ref 5–40)

## 2018-07-13 LAB — BMP8+EGFR
BUN/Creatinine Ratio: 15 (ref 10–24)
BUN: 25 mg/dL (ref 8–27)
CALCIUM: 9.5 mg/dL (ref 8.6–10.2)
CHLORIDE: 102 mmol/L (ref 96–106)
CO2: 22 mmol/L (ref 20–29)
Creatinine, Ser: 1.7 mg/dL — ABNORMAL HIGH (ref 0.76–1.27)
GFR calc Af Amer: 45 mL/min/{1.73_m2} — ABNORMAL LOW (ref >59)
GFR calc non Af Amer: 39 mL/min/{1.73_m2} — ABNORMAL LOW (ref >59)
GLUCOSE: 108 mg/dL — AB (ref 65–99)
POTASSIUM: 4.6 mmol/L (ref 3.5–5.2)
SODIUM: 139 mmol/L (ref 134–144)

## 2018-07-13 LAB — HEMOGLOBIN A1C
Est. average glucose Bld gHb Est-mCnc: 123 mg/dL
HEMOGLOBIN A1C: 5.9 % — AB (ref 4.8–5.6)

## 2018-09-07 ENCOUNTER — Other Ambulatory Visit: Payer: Self-pay | Admitting: Nurse Practitioner

## 2018-09-07 DIAGNOSIS — I10 Essential (primary) hypertension: Secondary | ICD-10-CM

## 2018-10-13 ENCOUNTER — Encounter: Payer: Self-pay | Admitting: Nurse Practitioner

## 2018-10-14 ENCOUNTER — Other Ambulatory Visit: Payer: Self-pay

## 2018-10-14 ENCOUNTER — Ambulatory Visit (INDEPENDENT_AMBULATORY_CARE_PROVIDER_SITE_OTHER): Payer: BC Managed Care – PPO | Admitting: Nurse Practitioner

## 2018-10-14 ENCOUNTER — Encounter: Payer: Self-pay | Admitting: Nurse Practitioner

## 2018-10-14 VITALS — BP 138/79 | HR 99 | Wt 189.8 lb

## 2018-10-14 DIAGNOSIS — K0889 Other specified disorders of teeth and supporting structures: Secondary | ICD-10-CM | POA: Diagnosis not present

## 2018-10-14 MED ORDER — AMOXICILLIN 875 MG PO TABS: 875.0000 mg | ORAL_TABLET | Freq: Two times a day (BID) | ORAL | 0 refills | Status: DC

## 2018-10-14 NOTE — Progress Notes (Addendum)
Virtual Visit via telephone   This visit type was conducted due to national recommendations for restrictions regarding the COVID-19 Pandemic (e.g. social distancing) in an effort to limit this patient's exposure and mitigate transmission in our community.  Patients identity confirmed using two different identifiers.  This format is felt to be most appropriate for this patient at this time.  All issues noted in this document were discussed and addressed.  No physical exam was performed (except for noted visual exam findings with Video Visits).    Date:  10/24/2018   ID:  Ruben [name], DOB 1945/10/21, MRN [id number]  Patient Location:  Home - spoke with Susa Day  Provider location:   Office    Chief Complaint:  Toothache  History of Present Illness:    Ruben [name] is a 73 y.o. male who presents via telephone conferencing for a telehealth visit today.    The patient does not have symptoms concerning for COVID-19 infection (fever, chills, cough, or new shortness of breath).   Complaining of tooth pain to left lower back tooth x 3-4 days.  His dentist has retired and he is unable to find a Pharmacist, community due to [place].  Denies fever, has been taking tylenol/ibuprofen for pain    Past Medical History:  Diagnosis Date  . Arthritis   . History of stress test 09/2008   showed inferolateral scar without ischemia  . Hx of echocardiogram 09/2008   was essentially normal  . Hyperlipidemia   . Hypertension    Past Surgical History:  Procedure Laterality Date  . BACK SURGERY     Dr Luiz Ochoa involving L3-L4 discectomy.  Marland Kitchen HERNIA REPAIR    . SPINE SURGERY       Current Meds  Medication Sig  . allopurinol (ZYLOPRIM) 100 MG tablet TAKE 1 TABLET BY MOUTH TWICE DAILY (Patient taking differently: Take 2 tabs daily)  . aspirin 81 MG tablet Take 81 mg by mouth daily.  Marland Kitchen ibuprofen (ADVIL) 200 MG tablet Take 200 mg by mouth every 6 (six) hours as needed. Take 2 tabs one  daily  . olmesartan-hydrochlorothiazide (BENICAR HCT) 40-25 MG tablet TAKE 1 TABLET BY MOUTH DAILY  . simvastatin (ZOCOR) 40 MG tablet TAKE 1 TABLET BY MOUTH EVERY DAY IN THE EVENING  . tadalafil (CIALIS) 10 MG tablet Take 10 mg by mouth daily as needed for erectile dysfunction.  . traMADol (ULTRAM) 50 MG tablet Take 1 tablet (50 mg total) by mouth every 12 (twelve) hours as needed.     Allergies:   Patient has no known allergies.   Social History   Tobacco Use  . Smoking status: Current Some Day Smoker    Packs/day: 1.50    Years: 20.00    Pack years: 30.00  . Smokeless tobacco: Never Used  Substance Use Topics  . Alcohol use: No    Alcohol/week: 0.0 standard drinks  . Drug use: No     Family Hx: The patient's family history includes Cancer in his father; Diabetes in his brother and brother; Heart attack in his brother; Heart disease in his brother and brother.  ROS:   Please see the history of present illness.    Review of Systems  Constitutional: Negative.   HENT:       Left lower tooth pain  Respiratory: Negative.   Cardiovascular: Negative.   Skin: Negative.   Neurological: Negative.     All other systems reviewed and are negative.   Labs/Other Tests and Data  Reviewed:    Recent Labs: 07/12/2018: BUN 25; Creatinine, Ser 1.70; Potassium 4.6; Sodium 139   Recent Lipid Panel Lab Results  Component Value Date/Time   CHOL 166 07/12/2018 11:27 AM   TRIG 101 07/12/2018 11:27 AM   HDL 49 07/12/2018 11:27 AM   CHOLHDL 3.4 07/12/2018 11:27 AM   LDLCALC 97 07/12/2018 11:27 AM    Wt Readings from Last 3 Encounters:  10/14/18 189 lb 12.8 oz (86.1 kg)  07/12/18 189 lb 8 oz (86 kg)  07/21/16 179 lb (81.2 kg)     Exam:    Vital Signs:  BP 138/79 (BP Location: Left Arm, Patient Position: Sitting, Cuff Size: Large)   Pulse 99   Wt 189 lb 12.8 oz (86.1 kg)   BMI 26.47 kg/m     Physical Exam  Constitutional: He is oriented to person, place, and time. No distress.   Neurological: He is alert and oriented to person, place, and time.  Psychiatric: Mood, memory, affect and judgment normal.  I am unable to visualize his tooth due to he is having difficulty with his phone  ASSESSMENT & PLAN:    1. Pain, dental  Bottom left tooth pain, continues to be swollen per patient.  Will treat with amoxicillin advised him to find a dentist to evaluate further - amoxicillin (AMOXIL) 875 MG tablet; Take 1 tablet (875 mg total) by mouth 2 (two) times daily.  Dispense: 14 tablet; Refill: 0   COVID-19 Education: The signs and symptoms of COVID-19 were discussed with the patient and how to seek care for testing (follow up with PCP or arrange E-visit).  The importance of social distancing was discussed today.  Patient Risk:   After full review of this patients clinical status, I feel that they are at least moderate risk at this time.  Time:   Today, I have spent 10 minutes/ seconds with the patient with telehealth technology discussing above diagnoses.     Medication Adjustments/Labs and Tests Ordered: Current medicines are reviewed at length with the patient today.  Concerns regarding medicines are outlined above.   Tests Ordered: No orders of the defined types were placed in this encounter.   Medication Changes: Meds ordered this encounter  Medications  . amoxicillin (AMOXIL) 875 MG tablet    Sig: Take 1 tablet (875 mg total) by mouth 2 (two) times daily.    Dispense:  14 tablet    Refill:  0    Disposition:  Follow up prn  Signed, [name], FNP

## 2018-11-08 ENCOUNTER — Other Ambulatory Visit: Payer: Self-pay | Admitting: Nurse Practitioner

## 2018-12-06 ENCOUNTER — Other Ambulatory Visit: Payer: Self-pay | Admitting: Nurse Practitioner

## 2018-12-08 ENCOUNTER — Other Ambulatory Visit: Payer: Self-pay | Admitting: Nurse Practitioner

## 2019-01-03 ENCOUNTER — Other Ambulatory Visit: Payer: Self-pay | Admitting: Nurse Practitioner

## 2019-01-17 ENCOUNTER — Other Ambulatory Visit: Payer: Self-pay

## 2019-01-17 ENCOUNTER — Encounter: Payer: Self-pay | Admitting: Nurse Practitioner

## 2019-01-17 ENCOUNTER — Ambulatory Visit: Payer: BLUE CROSS/BLUE SHIELD | Admitting: Nurse Practitioner

## 2019-01-17 ENCOUNTER — Ambulatory Visit: Payer: BC Managed Care – PPO | Admitting: Nurse Practitioner

## 2019-01-17 VITALS — BP 118/72 | HR 96 | Temp 97.7°F | Ht 67.8 in | Wt 180.2 lb

## 2019-01-17 DIAGNOSIS — R7303 Prediabetes: Secondary | ICD-10-CM

## 2019-01-17 DIAGNOSIS — Z23 Encounter for immunization: Secondary | ICD-10-CM | POA: Diagnosis not present

## 2019-01-17 DIAGNOSIS — Z0000 Encounter for general adult medical examination without abnormal findings: Secondary | ICD-10-CM | POA: Diagnosis not present

## 2019-01-17 DIAGNOSIS — I10 Essential (primary) hypertension: Secondary | ICD-10-CM

## 2019-01-17 DIAGNOSIS — E7800 Pure hypercholesterolemia, unspecified: Secondary | ICD-10-CM | POA: Diagnosis not present

## 2019-01-17 DIAGNOSIS — R21 Rash and other nonspecific skin eruption: Secondary | ICD-10-CM

## 2019-01-17 DIAGNOSIS — Z720 Tobacco use: Secondary | ICD-10-CM

## 2019-01-17 LAB — POCT URINALYSIS DIPSTICK
Bilirubin, UA: NEGATIVE
Glucose, UA: NEGATIVE
Leukocytes, UA: NEGATIVE
Nitrite, UA: NEGATIVE
Protein, UA: NEGATIVE
Spec Grav, UA: 1.025 (ref 1.010–1.025)
Urobilinogen, UA: 0.2 E.U./dL
pH, UA: 5 (ref 5.0–8.0)

## 2019-01-17 LAB — POCT UA - MICROALBUMIN
Albumin/Creatinine Ratio, Urine, POC: 30
Creatinine, POC: 300 mg/dL
Microalbumin Ur, POC: 10 mg/L

## 2019-01-17 NOTE — Progress Notes (Signed)
Subjective:     Patient ID: Ruben [name] , male    DOB: 13-May-1945 , 73 y.o.   MRN: [id number]   Chief Complaint  Patient presents with  . Annual Exam    HPI  Here for HM   He is a Administrator and works at night.  Hypertension This is a chronic problem. The current episode started more than 1 year ago. The problem is unchanged. The problem is controlled. Pertinent negatives include no anxiety, chest pain, headaches, palpitations or shortness of breath. Risk factors for coronary artery disease include obesity, sedentary lifestyle and male gender. Past treatments include diuretics and ACE inhibitors. There are no compliance problems.  There is no history of angina. There is no history of chronic renal disease.    Men's preventive visit. Patient Health Questionnaire (PHQ-2) is    Office Visit from 01/17/2019 in [facility]  PHQ-2 Total Score  0     Patient is on a regular diet, he has cut back on his junk food. Marital status: Married.  Relevant history for alcohol use is:  Social History   Substance and Sexual Activity  Alcohol Use No  . Alcohol/week: 0.0 standard drinks   Relevant history for tobacco use is:  Social History   Tobacco Use  Smoking Status Current Some Day Smoker  . Packs/day: 1.50  . Years: 20.00  . Pack years: 30.00  Smokeless Tobacco Never Used   Exercises with work walking from parking lot to building.  Past Medical History:  Diagnosis Date  . Arthritis   . History of stress test 09/2008   showed inferolateral scar without ischemia  . Hx of echocardiogram 09/2008   was essentially normal  . Hyperlipidemia   . Hypertension      Family History  Problem Relation Age of Onset  . Heart attack Brother   . Diabetes Brother   . Heart disease Brother   . Cancer Father   . Diabetes Brother   . Heart disease Brother      Current Outpatient Medications:  .  allopurinol (ZYLOPRIM) 100 MG tablet, TAKE 1 TABLET BY  MOUTH TWICE DAILY, Disp: 60 tablet, Rfl: 0 .  aspirin 81 MG tablet, Take 81 mg by mouth daily., Disp: , Rfl:  .  ibuprofen (ADVIL) 200 MG tablet, Take 200 mg by mouth every 6 (six) hours as needed. Take 2 tabs one daily, Disp: , Rfl:  .  olmesartan-hydrochlorothiazide (BENICAR HCT) 40-25 MG tablet, TAKE 1 TABLET BY MOUTH DAILY, Disp: 90 tablet, Rfl: 1 .  polyethylene glycol powder (GLYCOLAX/MIRALAX) powder, Take 17 g by mouth 2 (two) times daily as needed., Disp: 578 g, Rfl: 1 .  simvastatin (ZOCOR) 40 MG tablet, TAKE 1 TABLET BY MOUTH EVERY DAY IN THE EVENING, Disp: 90 tablet, Rfl: 0 .  tadalafil (CIALIS) 10 MG tablet, Take 10 mg by mouth daily as needed for erectile dysfunction., Disp: , Rfl:  .  traMADol (ULTRAM) 50 MG tablet, Take 1 tablet (50 mg total) by mouth every 12 (twelve) hours as needed. (Patient not taking: Reported on 01/17/2019), Disp: 30 tablet, Rfl: 0   No Known Allergies   Review of Systems  Constitutional: Negative.   HENT: Negative.   Respiratory: Negative.  Negative for shortness of breath.   Cardiovascular: Negative.  Negative for chest pain and palpitations.  Genitourinary: Negative.   Musculoskeletal: Negative.   Skin: Negative.   Neurological: Negative.  Negative for dizziness and headaches.  Hematological: Negative.  Psychiatric/Behavioral: Negative.      Today's Vitals   01/17/19 1121  BP: 118/72  Pulse: 96  Temp: 97.7 F (36.5 C)  TempSrc: Oral  Weight: 180 lb 3.2 oz (81.7 kg)  Height: 5' 7.8" (1.722 m)  PainSc: 0-No pain   Body mass index is 27.56 kg/m.   Objective:  Physical Exam Vitals signs reviewed.  Constitutional:      Appearance: Normal appearance.  HENT:     Head: Normocephalic.     Right Ear: Tympanic membrane normal.     Left Ear: Tympanic membrane normal.     Nose: Nose normal.     Mouth/Throat:     Mouth: Mucous membranes are moist.  Eyes:     Extraocular Movements: Extraocular movements intact.     Conjunctiva/sclera:  Conjunctivae normal.     Pupils: Pupils are equal, round, and reactive to light.  Cardiovascular:     Rate and Rhythm: Normal rate and regular rhythm.     Pulses: Normal pulses.     Heart sounds: Normal heart sounds.  Pulmonary:     Effort: Pulmonary effort is normal. No respiratory distress.     Breath sounds: Normal breath sounds.  Abdominal:     General: Abdomen is flat. Bowel sounds are normal.     Palpations: Abdomen is soft.  Musculoskeletal: Normal range of motion.  Skin:    General: Skin is warm.     Capillary Refill: Capillary refill takes less than 2 seconds.  Neurological:     General: No focal deficit present.     Mental Status: He is alert and oriented to person, place, and time.  Psychiatric:        Mood and Affect: Mood normal.        Behavior: Behavior normal.        Thought Content: Thought content normal.        Judgment: Judgment normal.         Assessment And Plan:     1. Health maintenance examination . Behavior modifications discussed and diet history reviewed.   . Pt will continue to exercise regularly and modify diet with low GI, plant based foods and decrease intake of processed foods.  . Recommend intake of daily multivitamin, Vitamin D, and calcium.  . Recommend colonoscopy for preventive screenings, as well as recommend immunizations that include influenza, TDAP  2. Essential hypertension . B/P is controlled.  . CMP ordered to check renal function.  . The importance of regular exercise and dietary modification was stressed to the patient.  . EKG done, no changes - POCT Urinalysis Dipstick (81002) - POCT UA - Microalbumin - EKG 12-Lead  3. Need for influenza vaccination  Influenza vaccine given in office  Advised to take Tylenol as needed for muscle aches or fever - Flu vaccine HIGH DOSE PF (Fluzone High dose)  4. Rash and nonspecific skin eruption   5. Prediabetes  Chronic, stable  Continue with focusing on healthy diet and  regular exercise  6. Tobacco abuse Ready to quit: No Counseling given: Yes  Smoking cessation instruction/counseling given:  counseled patient on the dangers of tobacco use, advised patient to stop smoking, and reviewed strategies to maximize success  7. Pure hypercholesterolemia  Chronic  Encouraged to avoid fried and fatty foods and increase fiber intake   [name], FNP    THE PATIENT IS ENCOURAGED TO PRACTICE SOCIAL DISTANCING DUE TO THE COVID-19 PANDEMIC.

## 2019-01-18 LAB — CBC
Hematocrit: 45.6 % (ref 37.5–51.0)
Hemoglobin: 16.1 g/dL (ref 13.0–17.7)
MCH: 33.5 pg — ABNORMAL HIGH (ref 26.6–33.0)
MCHC: 35.3 g/dL (ref 31.5–35.7)
MCV: 95 fL (ref 79–97)
Platelets: 166 10*3/uL (ref 150–450)
RBC: 4.8 x10E6/uL (ref 4.14–5.80)
RDW: 13.8 % (ref 11.6–15.4)
WBC: 6.6 10*3/uL (ref 3.4–10.8)

## 2019-01-18 LAB — LIPID PANEL
Chol/HDL Ratio: 3.3 ratio (ref 0.0–5.0)
Cholesterol, Total: 174 mg/dL (ref 100–199)
HDL: 53 mg/dL (ref >39)
LDL Chol Calc (NIH): 101 mg/dL — ABNORMAL HIGH (ref 0–99)
Triglycerides: 114 mg/dL (ref 0–149)
VLDL Cholesterol Cal: 20 mg/dL (ref 5–40)

## 2019-01-18 LAB — CMP14 + ANION GAP
ALT: 9 IU/L (ref 0–44)
AST: 17 IU/L (ref 0–40)
Albumin/Globulin Ratio: 1.6 (ref 1.2–2.2)
Albumin: 4.3 g/dL (ref 3.7–4.7)
Alkaline Phosphatase: 69 IU/L (ref 39–117)
Anion Gap: 15 mmol/L (ref 10.0–18.0)
BUN/Creatinine Ratio: 16 (ref 10–24)
BUN: 23 mg/dL (ref 8–27)
Bilirubin Total: 0.6 mg/dL (ref 0.0–1.2)
CO2: 21 mmol/L (ref 20–29)
Calcium: 9.6 mg/dL (ref 8.6–10.2)
Chloride: 105 mmol/L (ref 96–106)
Creatinine, Ser: 1.47 mg/dL — ABNORMAL HIGH (ref 0.76–1.27)
GFR calc Af Amer: 54 mL/min/{1.73_m2} — ABNORMAL LOW (ref >59)
GFR calc non Af Amer: 47 mL/min/{1.73_m2} — ABNORMAL LOW (ref >59)
Globulin, Total: 2.7 g/dL (ref 1.5–4.5)
Glucose: 89 mg/dL (ref 65–99)
Potassium: 4.2 mmol/L (ref 3.5–5.2)
Sodium: 141 mmol/L (ref 134–144)
Total Protein: 7 g/dL (ref 6.0–8.5)

## 2019-01-18 LAB — HEMOGLOBIN A1C
Est. average glucose Bld gHb Est-mCnc: 114 mg/dL
Hgb A1c MFr Bld: 5.6 % (ref 4.8–5.6)

## 2019-01-19 ENCOUNTER — Encounter: Payer: Self-pay | Admitting: Nurse Practitioner

## 2019-01-23 ENCOUNTER — Encounter: Payer: Self-pay | Admitting: Nurse Practitioner

## 2019-01-23 NOTE — Patient Instructions (Signed)
Health Maintenance  Topic Date Due  . COLONOSCOPY  12/03/2025  . TETANUS/TDAP  12/30/2026  . INFLUENZA VACCINE  Completed  . Hepatitis C Screening  Completed  . PNA vac Low Risk Adult  Completed   Health Maintenance After Age 73 After age 18, you are at a higher risk for certain long-term diseases and infections as well as injuries from falls. Falls are a major cause of broken bones and head injuries in people who are older than age 18. Getting regular preventive care can help to keep you healthy and well. Preventive care includes getting regular testing and making lifestyle changes as recommended by your health care provider. Talk with your health care provider about:  Which screenings and tests you should have. A screening is a test that checks for a disease when you have no symptoms.  A diet and exercise plan that is right for you. What should I know about screenings and tests to prevent falls? Screening and testing are the best ways to find a health problem early. Early diagnosis and treatment give you the best chance of managing medical conditions that are common after age 41. Certain conditions and lifestyle choices may make you more likely to have a fall. Your health care provider may recommend:  Regular vision checks. Poor vision and conditions such as cataracts can make you more likely to have a fall. If you wear glasses, make sure to get your prescription updated if your vision changes.  Medicine review. Work with your health care provider to regularly review all of the medicines you are taking, including over-the-counter medicines. Ask your health care provider about any side effects that may make you more likely to have a fall. Tell your health care provider if any medicines that you take make you feel dizzy or sleepy.  Osteoporosis screening. Osteoporosis is a condition that causes the bones to get weaker. This can make the bones weak and cause them to break more easily.  Blood  pressure screening. Blood pressure changes and medicines to control blood pressure can make you feel dizzy.  Strength and balance checks. Your health care provider may recommend certain tests to check your strength and balance while standing, walking, or changing positions.  Foot health exam. Foot pain and numbness, as well as not wearing proper footwear, can make you more likely to have a fall.  Depression screening. You may be more likely to have a fall if you have a fear of falling, feel emotionally low, or feel unable to do activities that you used to do.  Alcohol use screening. Using too much alcohol can affect your balance and may make you more likely to have a fall. What actions can I take to lower my risk of falls? General instructions  Talk with your health care provider about your risks for falling. Tell your health care provider if: ? You fall. Be sure to tell your health care provider about all falls, even ones that seem minor. ? You feel dizzy, sleepy, or off-balance.  Take over-the-counter and prescription medicines only as told by your health care provider. These include any supplements.  Eat a healthy diet and maintain a healthy weight. A healthy diet includes low-fat dairy products, low-fat (lean) meats, and fiber from whole grains, beans, and lots of fruits and vegetables. Home safety  Remove any tripping hazards, such as rugs, cords, and clutter.  Install safety equipment such as grab bars in bathrooms and safety rails on stairs.  Keep rooms and  walkways well-lit. Activity   Follow a regular exercise program to stay fit. This will help you maintain your balance. Ask your health care provider what types of exercise are appropriate for you.  If you need a cane or walker, use it as recommended by your health care provider.  Wear supportive shoes that have nonskid soles. Lifestyle  Do not drink alcohol if your health care provider tells you not to drink.  If you  drink alcohol, limit how much you have: ? 0-1 drink a day for women. ? 0-2 drinks a day for men.  Be aware of how much alcohol is in your drink. In the [place] one drink equals one typical bottle of beer (12 oz), one-half glass of wine (5 oz), or one shot of hard liquor (1 oz).  Do not use any products that contain nicotine or tobacco, such as cigarettes and e-cigarettes. If you need help quitting, ask your health care provider. Summary  Having a healthy lifestyle and getting preventive care can help to protect your health and wellness after age 50.  Screening and testing are the best way to find a health problem early and help you avoid having a fall. Early diagnosis and treatment give you the best chance for managing medical conditions that are more common for people who are older than age 49.  Falls are a major cause of broken bones and head injuries in people who are older than age 43. Take precautions to prevent a fall at home.  Work with your health care provider to learn what changes you can make to improve your health and wellness and to prevent falls. This information is not intended to replace advice given to you by your health care provider. Make sure you discuss any questions you have with your health care provider. Document Released: 03/04/2017 Document Revised: 08/12/2018 Document Reviewed: 03/04/2017 Elsevier Patient Education  2020 [employer].

## 2019-02-03 ENCOUNTER — Other Ambulatory Visit: Payer: Self-pay | Admitting: Nurse Practitioner

## 2019-02-03 DIAGNOSIS — I10 Essential (primary) hypertension: Secondary | ICD-10-CM

## 2019-02-06 ENCOUNTER — Other Ambulatory Visit: Payer: Self-pay | Admitting: Nurse Practitioner

## 2019-02-28 DIAGNOSIS — I10 Essential (primary) hypertension: Secondary | ICD-10-CM | POA: Diagnosis not present

## 2019-02-28 DIAGNOSIS — N183 Chronic kidney disease, stage 3 unspecified: Secondary | ICD-10-CM | POA: Diagnosis not present

## 2019-02-28 DIAGNOSIS — N2581 Secondary hyperparathyroidism of renal origin: Secondary | ICD-10-CM | POA: Diagnosis not present

## 2019-03-06 ENCOUNTER — Other Ambulatory Visit: Payer: Self-pay | Admitting: Nurse Practitioner

## 2019-03-08 ENCOUNTER — Other Ambulatory Visit: Payer: Self-pay

## 2019-03-08 MED ORDER — SIMVASTATIN 40 MG PO TABS: ORAL_TABLET | ORAL | 0 refills | Status: DC

## 2019-03-13 ENCOUNTER — Other Ambulatory Visit: Payer: Self-pay | Admitting: Nurse Practitioner

## 2019-04-08 ENCOUNTER — Encounter: Payer: Self-pay | Admitting: Nurse Practitioner

## 2019-04-11 ENCOUNTER — Other Ambulatory Visit: Payer: Self-pay | Admitting: Nurse Practitioner

## 2019-04-11 DIAGNOSIS — R3911 Hesitancy of micturition: Secondary | ICD-10-CM

## 2019-04-12 ENCOUNTER — Other Ambulatory Visit: Payer: Self-pay | Admitting: Nurse Practitioner

## 2019-05-05 DIAGNOSIS — R3911 Hesitancy of micturition: Secondary | ICD-10-CM | POA: Diagnosis not present

## 2019-05-10 ENCOUNTER — Other Ambulatory Visit: Payer: Self-pay | Admitting: Nurse Practitioner

## 2019-07-16 ENCOUNTER — Ambulatory Visit: Payer: Self-pay | Attending: Internal Medicine

## 2019-07-16 DIAGNOSIS — Z23 Encounter for immunization: Secondary | ICD-10-CM

## 2019-07-16 NOTE — Progress Notes (Signed)
   Covid-19 Vaccination Clinic  Name:  Ruben [name]    MRN: [id number] DOB: 1946/03/27  07/16/2019  [name] was observed post Covid-19 immunization for 15 minutes without incident. He was provided with Vaccine Information Sheet and instruction to access the V-Safe system.   [name] was instructed to call 911 with any severe reactions post vaccine: Marland Kitchen Difficulty breathing  . Swelling of face and throat  . A fast heartbeat  . A bad rash all over body  . Dizziness and weakness   Immunizations Administered    Name Date Dose VIS Date Route   Pfizer COVID-19 Vaccine 07/16/2019  9:20 AM 0.3 mL 04/15/2019 Intramuscular   Manufacturer: [employer]   Lot: [id number]   [place]: [address]-5

## 2019-07-18 ENCOUNTER — Encounter: Payer: Self-pay | Admitting: Nurse Practitioner

## 2019-07-18 ENCOUNTER — Ambulatory Visit: Payer: BC Managed Care – PPO | Admitting: Nurse Practitioner

## 2019-07-18 ENCOUNTER — Other Ambulatory Visit: Payer: Self-pay

## 2019-07-18 VITALS — BP 124/80 | HR 102 | Temp 97.7°F | Ht 67.8 in | Wt 179.8 lb

## 2019-07-18 DIAGNOSIS — I10 Essential (primary) hypertension: Secondary | ICD-10-CM

## 2019-07-18 DIAGNOSIS — K5900 Constipation, unspecified: Secondary | ICD-10-CM | POA: Diagnosis not present

## 2019-07-18 DIAGNOSIS — R140 Abdominal distension (gaseous): Secondary | ICD-10-CM

## 2019-07-18 DIAGNOSIS — I129 Hypertensive chronic kidney disease with stage 1 through stage 4 chronic kidney disease, or unspecified chronic kidney disease: Secondary | ICD-10-CM

## 2019-07-18 DIAGNOSIS — N1831 Chronic kidney disease, stage 3a: Secondary | ICD-10-CM

## 2019-07-18 DIAGNOSIS — R7303 Prediabetes: Secondary | ICD-10-CM | POA: Diagnosis not present

## 2019-07-18 DIAGNOSIS — Z720 Tobacco use: Secondary | ICD-10-CM

## 2019-07-18 MED ORDER — TADALAFIL 20 MG PO TABS: 20.0000 mg | ORAL_TABLET | ORAL | 3 refills | Status: DC

## 2019-07-18 MED ORDER — BUPROPION HCL ER (XL) 150 MG PO TB24: 150.0000 mg | ORAL_TABLET | ORAL | 2 refills | Status: DC

## 2019-07-18 NOTE — Progress Notes (Signed)
Subjective:     Patient ID: Ruben [name] , male    DOB: June 11, 1945 , 74 y.o.   MRN: [id number]   Chief Complaint  Patient presents with  . Hypertension    gas all around stomach/side and back area often, shoulder blades down to waste area is always cold     HPI  He received his first Covid vaccine one week ago - 12 hours later had arm pain.  Chills as well.  Increase in gas, bloating, and increase in bowel movements.  He is also taking a probiotic. He has seen dr hung in the past for same symptoms and given linzess never took the medication.  Has had a work up with CT scan which was negative.   He has tried patches was not effective and chantix to quit smoking did not wait to see if was effective due to impatience.       Hypertension This is a chronic problem. The current episode started more than 1 year ago. The problem is unchanged. The problem is controlled. Pertinent negatives include no anxiety, chest pain, headaches, palpitations or shortness of breath. There are no associated agents to hypertension. Risk factors for coronary artery disease include obesity and sedentary lifestyle. Past treatments include diuretics and angiotensin blockers. There are no compliance problems.  There is no history of angina or kidney disease. There is no history of chronic renal disease.     Past Medical History:  Diagnosis Date  . Arthritis   . History of stress test 09/2008   showed inferolateral scar without ischemia  . Hx of echocardiogram 09/2008   was essentially normal  . Hyperlipidemia   . Hypertension      Family History  Problem Relation Age of Onset  . Heart attack Brother   . Diabetes Brother   . Heart disease Brother   . Cancer Father   . Diabetes Brother   . Heart disease Brother      Current Outpatient Medications:  .  allopurinol (ZYLOPRIM) 100 MG tablet, TAKE 1 TABLET BY MOUTH TWICE DAILY, Disp: 60 tablet, Rfl: 0 .  aspirin 81 MG tablet, Take 81 mg by mouth  daily., Disp: , Rfl:  .  ibuprofen (ADVIL) 200 MG tablet, Take 200 mg by mouth every 6 (six) hours as needed. Take 2 tabs one daily, Disp: , Rfl:  .  olmesartan-hydrochlorothiazide (BENICAR HCT) 40-25 MG tablet, TAKE 1 TABLET BY MOUTH DAILY, Disp: 90 tablet, Rfl: 1 .  polyethylene glycol powder (GLYCOLAX/MIRALAX) powder, Take 17 g by mouth 2 (two) times daily as needed., Disp: 578 g, Rfl: 1 .  simvastatin (ZOCOR) 40 MG tablet, TAKE 1 TABLET BY MOUTH EVERY DAY IN THE EVENING, Disp: 90 tablet, Rfl: 0 .  tadalafil (CIALIS) 10 MG tablet, Take 10 mg by mouth daily as needed for erectile dysfunction., Disp: , Rfl:  .  tadalafil (CIALIS) 20 MG tablet, TAKE AS DIRECTED, Disp: 6 tablet, Rfl: 1   No Known Allergies   Review of Systems  Constitutional: Negative.  Negative for fatigue.  Respiratory: Negative for cough and shortness of breath.   Cardiovascular: Negative for chest pain, palpitations and leg swelling.  Gastrointestinal: Negative.   Endocrine: Negative for polydipsia, polyphagia and polyuria.  Musculoskeletal: Negative for arthralgias and gait problem.  Skin: Negative.   Neurological: Negative for dizziness and headaches.  Psychiatric/Behavioral: Negative.      Today's Vitals   07/18/19 1108  BP: 124/80  Pulse: (!) 102  Temp: 97.7 F (  36.5 C)  TempSrc: Oral  SpO2: 94%  Weight: 179 lb 12.8 oz (81.6 kg)  Height: 5' 7.8" (1.722 m)   Body mass index is 27.5 kg/m.   Objective:  Physical Exam Constitutional:      Appearance: Normal appearance.  Cardiovascular:     Rate and Rhythm: Normal rate and regular rhythm.     Pulses: Normal pulses.     Heart sounds: Normal heart sounds. No murmur.  Pulmonary:     Effort: Pulmonary effort is normal. No respiratory distress.     Breath sounds: Normal breath sounds.  Abdominal:     General: Bowel sounds are normal. There is no distension.     Palpations: Abdomen is soft.     Tenderness: There is no abdominal tenderness.     Comments:  Has some bloating noted   Skin:    General: Skin is warm and dry.     Capillary Refill: Capillary refill takes less than 2 seconds.  Neurological:     General: No focal deficit present.     Mental Status: He is oriented to person, place, and time.     Cranial Nerves: No cranial nerve deficit.  Psychiatric:        Mood and Affect: Mood normal.        Behavior: Behavior normal.        Thought Content: Thought content normal.        Judgment: Judgment normal.         Assessment And Plan:     1. Essential hypertension . B/P is controlled.  . CMP ordered to check renal function.  . The importance of regular exercise and dietary modification was stressed to the patient.  . Stressed importance of losing ten percent of her body weight to help with B/P control.  . The weight loss would help with decreasing cardiac and cancer risk as well.  - Lipid panel  2. Stage 3 chronic kidney disease ([place])  Continue follow up with Dr. Posey Pronto - BMP8+eGFR  3. Prediabetes  Chronic, controlled  No current medications  Encouraged to limit intake of sugary foods and drinks  Encouraged to increase physical activity to 150 minutes per week - BMP8+eGFR - Hemoglobin A1c  4. Tobacco abuse  Discussed smoking cessation  Discussed side effects of dry mouth and different thoughts he is to inform the office  - buPROPion (WELLBUTRIN XL) 150 MG 24 hr tablet; Take 1 tablet (150 mg total) by mouth every morning.  Dispense: 30 tablet; Refill: 2  5. Constipation, unspecified constipation type  Abdomen is soft with good bowel sounds he appears to be slightly bloated  Samples given for linzess to see if effective.   6. Bloating  Will try the linzess to see if this helps   [name], FNP

## 2019-07-18 NOTE — Patient Instructions (Signed)
Smoking Tobacco Information, Adult Smoking tobacco can be harmful to your health. Tobacco contains a poisonous (toxic), colorless chemical called nicotine. Nicotine is addictive. It changes the brain and can make it hard to stop smoking. Tobacco also has other toxic chemicals that can hurt your body and raise your risk of many cancers. How can smoking tobacco affect me? Smoking tobacco puts you at risk for:  Cancer. Smoking is most commonly associated with lung cancer, but can also lead to cancer in other parts of the body.  Chronic obstructive pulmonary disease (COPD). This is a long-term lung condition that makes it hard to breathe. It also gets worse over time.  High blood pressure (hypertension), heart disease, stroke, or heart attack.  Lung infections, such as pneumonia.  Cataracts. This is when the lenses in the eyes become clouded.  Digestive problems. This may include peptic ulcers, heartburn, and gastroesophageal reflux disease (GERD).  Oral health problems, such as gum disease and tooth loss.  Loss of taste and smell. Smoking can affect your appearance by causing:  Wrinkles.  Yellow or stained teeth, fingers, and fingernails. Smoking tobacco can also affect your social life, because:  It may be challenging to find places to smoke when away from home. Many workplaces, restaurants, hotels, and public places are tobacco-free.  Smoking is expensive. This is due to the cost of tobacco and the long-term costs of treating health problems from smoking.  Secondhand smoke may affect those around you. Secondhand smoke can cause lung cancer, breathing problems, and heart disease. Children of smokers have a higher risk for: ? Sudden infant death syndrome (SIDS). ? Ear infections. ? Lung infections. If you currently smoke tobacco, quitting now can help you:  Lead a longer and healthier life.  Look, smell, breathe, and feel better over time.  Save money.  Protect others from the  harms of secondhand smoke. What actions can I take to prevent health problems? Quit smoking   Do not start smoking. Quit if you already do.  Make a plan to quit smoking and commit to it. Look for programs to help you and ask your health care provider for recommendations and ideas.  Set a date and write down all the reasons you want to quit.  Let your friends and family know you are quitting so they can help and support you. Consider finding friends who also want to quit. It can be easier to quit with someone else, so that you can support each other.  Talk with your health care provider about using nicotine replacement medicines to help you quit, such as gum, lozenges, patches, sprays, or pills.  Do not replace cigarette smoking with electronic cigarettes, which are commonly called e-cigarettes. The safety of e-cigarettes is not known, and some may contain harmful chemicals.  If you try to quit but return to smoking, stay positive. It is common to slip up when you first quit, so take it one day at a time.  Be prepared for cravings. When you feel the urge to smoke, chew gum or suck on hard candy. Lifestyle  Stay busy and take care of your body.  Drink enough fluid to keep your urine pale yellow.  Get plenty of exercise and eat a healthy diet. This can help prevent weight gain after quitting.  Monitor your eating habits. Quitting smoking can cause you to have a larger appetite than when you smoke.  Find ways to relax. Go out with friends or family to a movie or a restaurant   where people do not smoke.  Ask your health care provider about having regular tests (screenings) to check for cancer. This may include blood tests, imaging tests, and other tests.  Find ways to manage your stress, such as meditation, yoga, or exercise. Where to find support To get support to quit smoking, consider:  Asking your health care provider for more information and resources.  Taking classes to learn  more about quitting smoking.  Looking for local organizations that offer resources about quitting smoking.  Joining a support group for people who want to quit smoking in your local community.  Calling the smokefree.gov counselor helpline: 1-800-Quit-Now 6147938426) Where to find more information You may find more information about quitting smoking from:  HelpGuide.org: www.helpguide.org  https://hall.com/: smokefree.gov  American Lung Association: www.lung.org Contact a health care provider if you:  Have problems breathing.  Notice that your lips, nose, or fingers turn blue.  Have chest pain.  Are coughing up blood.  Feel faint or you pass out.  Have other health changes that cause you to worry. Summary  Smoking tobacco can negatively affect your health, the health of those around you, your finances, and your social life.  Do not start smoking. Quit if you already do. If you need help quitting, ask your health care provider.  Think about joining a support group for people who want to quit smoking in your local community. There are many effective programs that will help you to quit this behavior. This information is not intended to replace advice given to you by your health care provider. Make sure you discuss any questions you have with your health care provider. Document Revised: 01/14/2019 Document Reviewed: 05/06/2016 Elsevier Patient Education  [place].  Bupropion sustained-release tablets (smoking cessation) What is this medicine? BUPROPION (byoo PROE pee on) is used to help people quit smoking. This medicine may be used for other purposes; ask your health care provider or pharmacist if you have questions. COMMON BRAND NAME(S): Buproban, Zyban What should I tell my health care provider before I take this medicine? They need to know if you have any of these conditions:  an eating disorder, such as anorexia or bulimia  bipolar disorder or  psychosis  diabetes or high blood sugar, treated with medication  glaucoma  head injury or brain tumor  heart disease, previous heart attack, or irregular heart beat  high blood pressure  kidney or liver disease  seizures  suicidal thoughts or a previous suicide attempt  Tourette's syndrome  weight loss  an unusual or allergic reaction to bupropion, other medicines, foods, dyes, or preservatives  breast-feeding  pregnant or trying to become pregnant How should I use this medicine? Take this medicine by mouth with a glass of water. Follow the directions on the prescription label. You can take it with or without food. If it upsets your stomach, take it with food. Do not cut, crush or chew this medicine. Take your medicine at regular intervals. If you take this medicine more than once a day, take your second dose at least 8 hours after you take your first dose. To limit difficulty in sleeping, avoid taking this medicine at bedtime. Do not take your medicine more often than directed. Do not stop taking this medicine suddenly except upon the advice of your doctor. Stopping this medicine too quickly may cause serious side effects. A special MedGuide will be given to you by the pharmacist with each prescription and refill. Be sure to read this information  carefully each time. Talk to your pediatrician regarding the use of this medicine in children. Special care may be needed. Overdosage: If you think you have taken too much of this medicine contact a poison control center or emergency room at once. NOTE: This medicine is only for you. Do not share this medicine with others. What if I miss a dose? If you miss a dose, skip the missed dose and take your next tablet at the regular time. There should be at least 8 hours between doses. Do not take double or extra doses. What may interact with this medicine? Do not take this medicine with any of the following medications:  linezolid  MAOIs  like Azilect, Carbex, Eldepryl, Marplan, Nardil, and Parnate  methylene blue (injected into a vein)  other medicines that contain bupropion like Wellbutrin This medicine may also interact with the following medications:  alcohol  certain medicines for anxiety or sleep  certain medicines for blood pressure like metoprolol, propranolol  certain medicines for depression or psychotic disturbances  certain medicines for HIV or AIDS like efavirenz, lopinavir, nelfinavir, ritonavir  certain medicines for irregular heart beat like propafenone, flecainide  certain medicines for Parkinson's disease like amantadine, levodopa  certain medicines for seizures like carbamazepine, phenytoin, phenobarbital  cimetidine  clopidogrel  cyclophosphamide  digoxin  furazolidone  isoniazid  nicotine  orphenadrine  procarbazine  steroid medicines like prednisone or cortisone  stimulant medicines for attention disorders, weight loss, or to stay awake  tamoxifen  theophylline  thiotepa  ticlopidine  tramadol  warfarin This list may not describe all possible interactions. Give your health care provider a list of all the medicines, herbs, non-prescription drugs, or dietary supplements you use. Also tell them if you smoke, drink alcohol, or use illegal drugs. Some items may interact with your medicine. What should I watch for while using this medicine? Visit your doctor or healthcare provider for regular checks on your progress. This medicine should be used together with a patient support program. It is important to participate in a behavioral program, counseling, or other support program that is recommended by your healthcare provider. This medicine may cause serious skin reactions. They can happen weeks to months after starting the medicine. Contact your healthcare provider right away if you notice fevers or flu-like symptoms with a rash. The rash may be red or purple and then turn into  blisters or peeling of the skin. Or, you might notice a red rash with swelling of the face, lips or lymph nodes in your neck or under your arms. Patients and their families should watch out for new or worsening thoughts of suicide or depression. Also watch out for sudden changes in feelings such as feeling anxious, agitated, panicky, irritable, hostile, aggressive, impulsive, severely restless, overly excited and hyperactive, or not being able to sleep. If this happens, especially at the beginning of treatment or after a change in dose, call your healthcare provider. Avoid alcoholic drinks while taking this medicine. Drinking excessive alcoholic beverages, using sleeping or anxiety medicines, or quickly stopping the use of these agents while taking this medicine may increase your risk for a seizure. Do not drive or use heavy machinery until you know how this medicine affects you. This medicine can impair your ability to perform these tasks. Do not take this medicine close to bedtime. It may prevent you from sleeping. Your mouth may get dry. Chewing sugarless gum or sucking hard candy, and drinking plenty of water may help. Contact your doctor if  the problem does not go away or is severe. Do not use nicotine patches or chewing gum without the advice of your doctor or healthcare provider while taking this medicine. You may need to have your blood pressure taken regularly if your doctor recommends that you use both nicotine and this medicine together. What side effects may I notice from receiving this medicine? Side effects that you should report to your doctor or health care professional as soon as possible:  allergic reactions like skin rash, itching or hives, swelling of the face, lips, or tongue  breathing problems  changes in vision  confusion  elevated mood, decreased need for sleep, racing thoughts, impulsive behavior  fast or irregular heartbeat  hallucinations, loss of contact with  reality  increased blood pressure  rash, fever, and swollen lymph nodes  redness, blistering, peeling, or loosening of the skin, including inside the mouth  seizures  suicidal thoughts or other mood changes  unusually weak or tired  vomiting Side effects that usually do not require medical attention (report to your doctor or health care professional if they continue or are bothersome):  constipation  headache  loss of appetite  nausea  tremors  weight loss This list may not describe all possible side effects. Call your doctor for medical advice about side effects. You may report side effects to FDA at 1-800-FDA-1088. Where should I keep my medicine? Keep out of the reach of children. Store at room temperature between 20 and 25 degrees C (68 and 77 degrees F). Protect from light. Keep container tightly closed. Throw away any unused medicine after the expiration date. NOTE: This sheet is a summary. It may not cover all possible information. If you have questions about this medicine, talk to your doctor, pharmacist, or health care provider.  2020 Elsevier/Gold Standard (2018-07-15 13:59:09)

## 2019-07-19 LAB — CMP14+EGFR
ALT: 12 IU/L (ref 0–44)
AST: 18 IU/L (ref 0–40)
Albumin/Globulin Ratio: 1.7 (ref 1.2–2.2)
Albumin: 4.3 g/dL (ref 3.7–4.7)
Alkaline Phosphatase: 68 IU/L (ref 39–117)
BUN/Creatinine Ratio: 13 (ref 10–24)
BUN: 20 mg/dL (ref 8–27)
Bilirubin Total: 0.5 mg/dL (ref 0.0–1.2)
CO2: 21 mmol/L (ref 20–29)
Calcium: 9.6 mg/dL (ref 8.6–10.2)
Chloride: 107 mmol/L — ABNORMAL HIGH (ref 96–106)
Creatinine, Ser: 1.55 mg/dL — ABNORMAL HIGH (ref 0.76–1.27)
GFR calc Af Amer: 50 mL/min/{1.73_m2} — ABNORMAL LOW (ref >59)
GFR calc non Af Amer: 43 mL/min/{1.73_m2} — ABNORMAL LOW (ref >59)
Globulin, Total: 2.5 g/dL (ref 1.5–4.5)
Glucose: 86 mg/dL (ref 65–99)
Potassium: 4.2 mmol/L (ref 3.5–5.2)
Sodium: 143 mmol/L (ref 134–144)
Total Protein: 6.8 g/dL (ref 6.0–8.5)

## 2019-07-28 ENCOUNTER — Other Ambulatory Visit: Payer: Self-pay | Admitting: Nurse Practitioner

## 2019-07-28 DIAGNOSIS — I10 Essential (primary) hypertension: Secondary | ICD-10-CM

## 2019-08-08 ENCOUNTER — Ambulatory Visit: Payer: Self-pay | Attending: Internal Medicine

## 2019-08-08 ENCOUNTER — Ambulatory Visit: Payer: Self-pay

## 2019-08-08 DIAGNOSIS — Z23 Encounter for immunization: Secondary | ICD-10-CM

## 2019-08-08 NOTE — Progress Notes (Signed)
   [facility]ov[facility] Vaccination [facility]  Name:  Ruben [name]    MRN: [id number] DOB: [birth year]  08/08/2019  [name] was observed post Covid-19 immunization for 15 minutes without incident. He was provided with Vaccine Information Sheet and instruction to access the V-Safe system.   [name] was instructed to call 911 with any severe reactions post vaccine: Marland Kitchen Difficulty breathing  . Swelling of face and throat  . A fast heartbeat  . A bad rash all over body  . Dizziness and weakness   Immunizations Administered    Name Date Dose VIS Date Route   Pfizer COVID-19 Vaccine 08/08/2019  1:54 PM 0.3 mL 04/15/2019 Intramuscular   Manufacturer: [employer], [employer]   Lot: [id number]   [place]: [id number]

## 2019-08-19 ENCOUNTER — Other Ambulatory Visit: Payer: Self-pay | Admitting: Nurse Practitioner

## 2019-10-24 ENCOUNTER — Other Ambulatory Visit: Payer: Self-pay

## 2019-10-24 ENCOUNTER — Encounter: Payer: Self-pay | Admitting: Nurse Practitioner

## 2019-10-24 ENCOUNTER — Ambulatory Visit: Payer: BC Managed Care – PPO | Admitting: Nurse Practitioner

## 2019-10-24 VITALS — BP 130/78 | HR 88 | Temp 98.0°F | Ht 68.4 in | Wt 181.4 lb

## 2019-10-24 DIAGNOSIS — I10 Essential (primary) hypertension: Secondary | ICD-10-CM | POA: Diagnosis not present

## 2019-10-24 DIAGNOSIS — N1831 Chronic kidney disease, stage 3a: Secondary | ICD-10-CM

## 2019-10-24 DIAGNOSIS — G8929 Other chronic pain: Secondary | ICD-10-CM

## 2019-10-24 DIAGNOSIS — Z720 Tobacco use: Secondary | ICD-10-CM

## 2019-10-24 DIAGNOSIS — M549 Dorsalgia, unspecified: Secondary | ICD-10-CM | POA: Diagnosis not present

## 2019-10-24 DIAGNOSIS — E7800 Pure hypercholesterolemia, unspecified: Secondary | ICD-10-CM

## 2019-10-24 MED ORDER — BUPROPION HCL ER (XL) 150 MG PO TB24: 150.0000 mg | ORAL_TABLET | ORAL | 1 refills | Status: DC

## 2019-10-24 MED ORDER — METHOCARBAMOL 500 MG PO TABS: 500.0000 mg | ORAL_TABLET | Freq: Three times a day (TID) | ORAL | 1 refills | Status: DC | PRN

## 2019-10-24 MED ORDER — DICLOFENAC SODIUM 1 % EX GEL: 2.0000 g | Freq: Four times a day (QID) | CUTANEOUS | 2 refills | Status: DC

## 2019-10-24 NOTE — Progress Notes (Signed)
Subjective:     Patient ID: Ruben [name] , male    DOB: [birth year] , 74 y.o.   MRN: [id number]   Chief Complaint  Patient presents with  . Back Pain    patient would like a prescription for voltaren gel and methocarbamol 517m     HPI   He is here today with a complaint of lower back pain and HTN follow up. He thinks it is arthritis and the pain is from shoulders to his waist. He is also having spasm that are present after he exerts himself and are relived with rest. He is also taking tylenol arthritis and his pain 3/10 and this is a daily pain.  His blood pressure has been running good and he is watching his sodium intake. He is compliant with his medication and has not had no side effects. He is down to two pack a week from two packs a day.  Hypertension This is a chronic problem. The current episode started more than 1 year ago. The problem is unchanged. The problem is controlled. Pertinent negatives include no anxiety, chest pain, headaches or palpitations. There are no associated agents to hypertension. Risk factors for coronary artery disease include obesity and sedentary lifestyle. Past treatments include diuretics and angiotensin blockers. There are no compliance problems.  There is no history of angina or kidney disease. There is no history of chronic renal disease.  Back Pain This is a chronic problem. The current episode started more than 1 year ago. The problem occurs daily. The problem is unchanged. The pain is present in the thoracic spine and lumbar spine. The quality of the pain is described as cramping and aching. The pain does not radiate. The pain is at a severity of 3/10. The pain is moderate. The pain is the same all the time. The symptoms are aggravated by bending, lying down, sitting and standing. Stiffness is present all day. Pertinent negatives include no chest pain or headaches. He has tried analgesics for the symptoms. The treatment provided mild relief.      Past Medical History:  Diagnosis Date  . Arthritis   . History of stress test 09/2008   showed inferolateral scar without ischemia  . Hx of echocardiogram 09/2008   was essentially normal  . Hyperlipidemia   . Hypertension      Family History  Problem Relation Age of Onset  . Heart attack Brother   . Diabetes Brother   . Heart disease Brother   . Cancer Father   . Diabetes Brother   . Heart disease Brother      Current Outpatient Medications:  .  allopurinol (ZYLOPRIM) 100 MG tablet, TAKE 1 TABLET BY MOUTH TWICE DAILY, Disp: 60 tablet, Rfl: 0 .  aspirin 81 MG tablet, Take 81 mg by mouth daily., Disp: , Rfl:  .  buPROPion (WELLBUTRIN XL) 150 MG 24 hr tablet, Take 1 tablet (150 mg total) by mouth every morning., Disp: 30 tablet, Rfl: 2 .  ibuprofen (ADVIL) 200 MG tablet, Take 200 mg by mouth every 6 (six) hours as needed. Take 2 tabs one daily, Disp: , Rfl:  .  olmesartan-hydrochlorothiazide (BENICAR HCT) 40-25 MG tablet, TAKE 1 TABLET BY MOUTH DAILY, Disp: 90 tablet, Rfl: 1 .  polyethylene glycol powder (GLYCOLAX/MIRALAX) powder, Take 17 g by mouth 2 (two) times daily as needed., Disp: 578 g, Rfl: 1 .  simvastatin (ZOCOR) 40 MG tablet, TAKE 1 TABLET BY MOUTH EVERY DAY IN THE EVENING, Disp: 90 tablet,  Rfl: 0 .  tadalafil (CIALIS) 20 MG tablet, Take 1 tablet (20 mg total) by mouth See admin instructions., Disp: 6 tablet, Rfl: 3 .  tadalafil (CIALIS) 10 MG tablet, Take 10 mg by mouth daily as needed for erectile dysfunction. (Patient not taking: Reported on 10/24/2019), Disp: , Rfl:    No Known Allergies   Review of Systems  Constitutional: Negative.  Negative for fatigue.  Cardiovascular: Negative for chest pain, palpitations and leg swelling.  Gastrointestinal: Negative.   Endocrine: Negative for polydipsia, polyphagia and polyuria.  Musculoskeletal: Positive for back pain. Negative for arthralgias and gait problem.       Stiffness and pain in his knees and back from  shoulders to lower back.  Skin: Negative.   Neurological: Negative for dizziness and headaches.  Psychiatric/Behavioral: Negative.      Today's Vitals   10/24/19 1105  BP: 130/78  Pulse: 88  Temp: 98 F (36.7 C)  TempSrc: Oral  Weight: 181 lb 6.4 oz (82.3 kg)  Height: 5' 8.4" (1.737 m)  PainSc: 0-No pain   Body mass index is 27.26 kg/m.   Objective:  Physical Exam Constitutional:      Appearance: Normal appearance.  Cardiovascular:     Rate and Rhythm: Normal rate and regular rhythm.     Pulses: Normal pulses.     Heart sounds: Normal heart sounds. No murmur heard.   Pulmonary:     Effort: Pulmonary effort is normal. No respiratory distress.     Breath sounds: Normal breath sounds.  Abdominal:     General: Bowel sounds are normal. There is no distension.     Palpations: Abdomen is soft.     Tenderness: There is no abdominal tenderness.     Comments: Has some bloating noted   Musculoskeletal:        General: No swelling or tenderness.     Comments: Stiffness noted to back  Skin:    General: Skin is warm and dry.     Capillary Refill: Capillary refill takes less than 2 seconds.  Neurological:     General: No focal deficit present.     Mental Status: He is oriented to person, place, and time.     Cranial Nerves: No cranial nerve deficit.  Psychiatric:        Mood and Affect: Mood normal.        Behavior: Behavior normal.        Thought Content: Thought content normal.        Judgment: Judgment normal.         Assessment And Plan:     1. Essential hypertension . B/P is controlled.  . CMP ordered to check renal function.  . The importance of regular exercise and dietary modification was stressed to the patient.  . Stressed importance of losing ten percent of her body weight to help with B/P control.  . The weight loss would help with decreasing cardiac and cancer risk as well.  - Lipid panel - CMP14+EGFR  2. Stage 3a chronic kidney disease  He is being  followed by [facility]  3. Chronic bilateral back pain, unspecified back location  Encouraged to stretch regularly and increase his physical activity as tolerated - diclofenac Sodium (VOLTAREN) 1 % GEL; Apply 2 g topically 4 (four) times daily.  Dispense: 100 g; Refill: 2 - methocarbamol (ROBAXIN) 500 MG tablet; Take 1 tablet (500 mg total) by mouth every 8 (eight) hours as needed for muscle spasms.  Dispense: 20  tablet; Refill: 1  4. Tobacco abuse  Doing better with his smoking and tolerating wellbutrin well. - buPROPion (WELLBUTRIN XL) 150 MG 24 hr tablet; Take 1 tablet (150 mg total) by mouth every morning.  Dispense: 90 tablet; Refill: 1  5. Pure hypercholesterolemia  Chronic, stable.  Continue with current medications   Marylu Lund, RN    [name], DNP, FNP-BC

## 2019-10-24 NOTE — Patient Instructions (Signed)

## 2019-10-25 LAB — CMP14+EGFR
ALT: 10 IU/L (ref 0–44)
AST: 17 IU/L (ref 0–40)
Albumin/Globulin Ratio: 1.7 (ref 1.2–2.2)
Albumin: 4.3 g/dL (ref 3.7–4.7)
Alkaline Phosphatase: 61 IU/L (ref 48–121)
BUN/Creatinine Ratio: 16 (ref 10–24)
BUN: 24 mg/dL (ref 8–27)
Bilirubin Total: 0.5 mg/dL (ref 0.0–1.2)
CO2: 20 mmol/L (ref 20–29)
Calcium: 9.6 mg/dL (ref 8.6–10.2)
Chloride: 102 mmol/L (ref 96–106)
Creatinine, Ser: 1.51 mg/dL — ABNORMAL HIGH (ref 0.76–1.27)
GFR calc Af Amer: 52 mL/min/{1.73_m2} — ABNORMAL LOW (ref >59)
GFR calc non Af Amer: 45 mL/min/{1.73_m2} — ABNORMAL LOW (ref >59)
Globulin, Total: 2.6 g/dL (ref 1.5–4.5)
Glucose: 96 mg/dL (ref 65–99)
Potassium: 4.4 mmol/L (ref 3.5–5.2)
Sodium: 137 mmol/L (ref 134–144)
Total Protein: 6.9 g/dL (ref 6.0–8.5)

## 2019-10-25 LAB — LIPID PANEL
Chol/HDL Ratio: 4.8 ratio (ref 0.0–5.0)
Cholesterol, Total: 227 mg/dL — ABNORMAL HIGH (ref 100–199)
HDL: 47 mg/dL (ref >39)
LDL Chol Calc (NIH): 163 mg/dL — ABNORMAL HIGH (ref 0–99)
Triglycerides: 98 mg/dL (ref 0–149)
VLDL Cholesterol Cal: 17 mg/dL (ref 5–40)

## 2019-11-06 ENCOUNTER — Other Ambulatory Visit: Payer: Self-pay | Admitting: Nurse Practitioner

## 2019-11-06 DIAGNOSIS — M549 Dorsalgia, unspecified: Secondary | ICD-10-CM

## 2019-11-20 ENCOUNTER — Other Ambulatory Visit: Payer: Self-pay | Admitting: Nurse Practitioner

## 2019-12-21 ENCOUNTER — Other Ambulatory Visit: Payer: Self-pay | Admitting: Nurse Practitioner

## 2019-12-26 ENCOUNTER — Other Ambulatory Visit: Payer: Self-pay | Admitting: Nurse Practitioner

## 2019-12-29 DIAGNOSIS — N189 Chronic kidney disease, unspecified: Secondary | ICD-10-CM | POA: Diagnosis not present

## 2019-12-29 DIAGNOSIS — N2581 Secondary hyperparathyroidism of renal origin: Secondary | ICD-10-CM | POA: Diagnosis not present

## 2019-12-29 DIAGNOSIS — I10 Essential (primary) hypertension: Secondary | ICD-10-CM | POA: Diagnosis not present

## 2019-12-29 DIAGNOSIS — N183 Chronic kidney disease, stage 3 unspecified: Secondary | ICD-10-CM | POA: Diagnosis not present

## 2020-01-17 ENCOUNTER — Other Ambulatory Visit: Payer: Self-pay | Admitting: Nurse Practitioner

## 2020-01-23 ENCOUNTER — Other Ambulatory Visit: Payer: Self-pay

## 2020-01-23 ENCOUNTER — Ambulatory Visit: Payer: BC Managed Care – PPO | Admitting: Nurse Practitioner

## 2020-01-23 ENCOUNTER — Encounter: Payer: Self-pay | Admitting: Nurse Practitioner

## 2020-01-23 VITALS — BP 118/64 | HR 68 | Temp 97.8°F | Ht 68.4 in | Wt 183.0 lb

## 2020-01-23 DIAGNOSIS — Z8739 Personal history of other diseases of the musculoskeletal system and connective tissue: Secondary | ICD-10-CM

## 2020-01-23 DIAGNOSIS — R7303 Prediabetes: Secondary | ICD-10-CM | POA: Diagnosis not present

## 2020-01-23 DIAGNOSIS — Z23 Encounter for immunization: Secondary | ICD-10-CM | POA: Diagnosis not present

## 2020-01-23 DIAGNOSIS — Z125 Encounter for screening for malignant neoplasm of prostate: Secondary | ICD-10-CM | POA: Diagnosis not present

## 2020-01-23 DIAGNOSIS — Z720 Tobacco use: Secondary | ICD-10-CM | POA: Diagnosis not present

## 2020-01-23 DIAGNOSIS — I10 Essential (primary) hypertension: Secondary | ICD-10-CM | POA: Diagnosis not present

## 2020-01-23 DIAGNOSIS — Z0000 Encounter for general adult medical examination without abnormal findings: Secondary | ICD-10-CM

## 2020-01-23 DIAGNOSIS — N1831 Chronic kidney disease, stage 3a: Secondary | ICD-10-CM

## 2020-01-23 DIAGNOSIS — E7800 Pure hypercholesterolemia, unspecified: Secondary | ICD-10-CM

## 2020-01-23 LAB — POCT URINALYSIS DIPSTICK
Bilirubin, UA: NEGATIVE
Glucose, UA: NEGATIVE
Ketones, UA: NEGATIVE
Leukocytes, UA: NEGATIVE
Nitrite, UA: NEGATIVE
Protein, UA: POSITIVE — AB
Spec Grav, UA: 1.025 (ref 1.010–1.025)
Urobilinogen, UA: 1 E.U./dL
pH, UA: 6 (ref 5.0–8.0)

## 2020-01-23 LAB — POCT UA - MICROALBUMIN
Creatinine, POC: 300 mg/dL
Microalbumin Ur, POC: 80 mg/L

## 2020-01-23 MED ORDER — BUPROPION HCL ER (XL) 150 MG PO TB24: 150.0000 mg | ORAL_TABLET | ORAL | 1 refills | Status: DC

## 2020-01-23 MED ORDER — ALLOPURINOL 100 MG PO TABS: 100.0000 mg | ORAL_TABLET | Freq: Two times a day (BID) | ORAL | 1 refills | Status: DC

## 2020-01-23 MED ORDER — OLMESARTAN MEDOXOMIL-HCTZ 40-25 MG PO TABS: 1.0000 | ORAL_TABLET | Freq: Every day | ORAL | 1 refills | Status: DC

## 2020-01-23 NOTE — Progress Notes (Signed)
This visit occurred during the SARS-CoV-2 public health emergency.  Safety protocols were in place, including screening questions prior to the visit, additional usage of staff PPE, and extensive cleaning of exam room while observing appropriate contact time as indicated for disinfecting solutions.  Subjective:     Patient ID: Ruben [name] , male    DOB: 07/24/1945 , 74 y.o.   MRN: [id number]   Chief Complaint  Patient presents with  . Health Maintenance    HPI  Here for HM.  He continues to work as a Programmer, systems.  He just came from [place] yesterday.    Wt Readings from Last 3 Encounters: 01/23/20 : 183 lb (83 kg) 10/24/19 : 181 lb 6.4 oz (82.3 kg) 07/18/19 : 179 lb 12.8 oz (81.6 kg)      Past Medical History:  Diagnosis Date  . Arthritis   . History of stress test 09/2008   showed inferolateral scar without ischemia  . Hx of echocardiogram 09/2008   was essentially normal  . Hyperlipidemia   . Hypertension      Family History  Problem Relation Age of Onset  . Heart attack Brother   . Diabetes Brother   . Heart disease Brother   . Cancer Father   . Diabetes Brother   . Heart disease Brother      Current Outpatient Medications:  .  allopurinol (ZYLOPRIM) 100 MG tablet, Take 1 tablet (100 mg total) by mouth 2 (two) times daily., Disp: 180 tablet, Rfl: 1 .  aspirin 81 MG tablet, Take 81 mg by mouth daily., Disp: , Rfl:  .  buPROPion (WELLBUTRIN XL) 150 MG 24 hr tablet, Take 1 tablet (150 mg total) by mouth every morning., Disp: 90 tablet, Rfl: 1 .  diclofenac Sodium (VOLTAREN) 1 % GEL, Apply 2 g topically 4 (four) times daily., Disp: 100 g, Rfl: 2 .  ibuprofen (ADVIL) 200 MG tablet, Take 200 mg by mouth every 6 (six) hours as needed. Take 2 tabs one daily, Disp: , Rfl:  .  olmesartan-hydrochlorothiazide (BENICAR HCT) 40-25 MG tablet, Take 1 tablet by mouth daily., Disp: 90 tablet, Rfl: 1 .  polyethylene glycol powder (GLYCOLAX/MIRALAX)  powder, Take 17 g by mouth 2 (two) times daily as needed., Disp: 578 g, Rfl: 1 .  simvastatin (ZOCOR) 40 MG tablet, TAKE 1 TABLET BY MOUTH EVERY DAY IN THE EVENING, Disp: 90 tablet, Rfl: 0 .  tadalafil (CIALIS) 20 MG tablet, TAKE 1 TABLET BY MOUTH, Disp: 6 tablet, Rfl: 3 .  methocarbamol (ROBAXIN) 500 MG tablet, TAKE 1 TABLET(500 MG) BY MOUTH EVERY 8 HOURS AS NEEDED FOR MUSCLE SPASMS (Patient not taking: Reported on 01/23/2020), Disp: 20 tablet, Rfl: 1   No Known Allergies   Men's preventive visit. Patient Health Questionnaire (PHQ-2) is    Office Visit from 01/23/2020 in [facility]  PHQ-2 Total Score 0     Patient is on a regular diet. 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 Marital status: Married. Relevant history for alcohol use is:  Social History   Substance and Sexual Activity  Alcohol Use No  . Alcohol/week: 0.0 standard drinks  . Relevant history for tobacco use is:  Social History   Tobacco Use  Smoking Status Current Some Day Smoker  . Packs/day: 1.50  . Years: 20.00  . Pack years: 30.00  Smokeless Tobacco Never Used  Tobacco Comment   down to 2 packs per week  .   Review of  Systems  Constitutional: Negative.   HENT: Negative.   Eyes: Negative.   Respiratory: Negative.  Negative for cough.   Cardiovascular: Negative.  Negative for chest pain, palpitations and leg swelling.  Gastrointestinal: Negative.   Endocrine: Negative.   Genitourinary: Negative.   Musculoskeletal: Negative.   Skin: Negative.   Allergic/Immunologic: Negative.   Neurological: Negative.  Negative for dizziness and headaches.  Hematological: Negative.   Psychiatric/Behavioral: Negative.      Today's Vitals   01/23/20 1009  BP: 118/64  Pulse: 68  Temp: 97.8 F (36.6 C)  TempSrc: Oral  Weight: 183 lb (83 kg)  Height: 5' 8.4" (1.737 m)  PainSc: 0-No pain   Body mass index is 27.5 kg/m.   Objective:  Physical Exam Vitals reviewed.  Constitutional:       General: He is not in acute distress.    Appearance: Normal appearance.  HENT:     Head: Normocephalic and atraumatic.     Right Ear: Tympanic membrane, ear canal and external ear normal. There is no impacted cerumen.     Left Ear: Tympanic membrane, ear canal and external ear normal. There is no impacted cerumen.     Ears:     Comments: He has soft cerumen present to both canals    Nose:     Comments: Deferred - masked     Mouth/Throat:     Comments: Deferred - masked Eyes:     Pupils: Pupils are equal, round, and reactive to light.  Cardiovascular:     Rate and Rhythm: Normal rate and regular rhythm.     Pulses: Normal pulses.     Heart sounds: Normal heart sounds. No murmur heard.   Pulmonary:     Effort: Pulmonary effort is normal. No respiratory distress.     Breath sounds: Normal breath sounds. No wheezing.  Abdominal:     General: Abdomen is flat. Bowel sounds are normal. There is no distension.     Palpations: Abdomen is soft.     Tenderness: There is no abdominal tenderness.  Genitourinary:    Prostate: Normal.     Rectum: Guaiac result negative.  Musculoskeletal:        General: No swelling or tenderness. Normal range of motion.     Cervical back: Normal range of motion and neck supple.  Skin:    General: Skin is warm and dry.     Capillary Refill: Capillary refill takes less than 2 seconds.  Neurological:     General: No focal deficit present.     Mental Status: He is alert and oriented to person, place, and time.     Cranial Nerves: No cranial nerve deficit.  Psychiatric:        Mood and Affect: Mood normal.        Behavior: Behavior normal.        Thought Content: Thought content normal.        Judgment: Judgment normal.      Assessment And Plan:    1. Health maintenance examination . Behavior modifications discussed and diet history reviewed.   . Pt will continue to exercise regularly and modify diet with low GI, plant based foods and decrease intake  of processed foods.  . Recommend intake of daily multivitamin, Vitamin D, and calcium.  . Recommend for preventive screenings, as well as recommend immunizations that include influenza, TDAP  2. Encounter for prostate cancer screening - PSA  3. Need for influenza vaccination  Influenza vaccine administered  Encouraged  to take Tylenol as needed for fever or muscle aches. - Flu Vaccine QUAD High Dose(Fluad)  4. Tobacco abuse  Smoking cessation instruction/counseling given:  counseled patient on the dangers of tobacco use, advised patient to stop smoking, and reviewed strategies to maximize success, he continues to take wellbutrin daily - buPROPion (WELLBUTRIN XL) 150 MG 24 hr tablet; Take 1 tablet (150 mg total) by mouth every morning.  Dispense: 90 tablet; Refill: 1  5. Essential hypertension . B/P is controlled.  . CMP ordered to check renal function.  . The importance of regular exercise and dietary modification was stressed to the patient.  . Stressed importance of losing ten percent of her body weight to help with B/P control.  . The weight loss would help with decreasing cardiac and cancer risk as well.  . EKG done - nonspecific T-abnormality - POCT Urinalysis Dipstick (81002) - POCT UA - Microalbumin - EKG 12-Lead - olmesartan-hydrochlorothiazide (BENICAR HCT) 40-25 MG tablet; Take 1 tablet by mouth daily.  Dispense: 90 tablet; Refill: 1 - CMP14+EGFR - CBC  6. Stage 3a chronic kidney disease  Continue follow up with Dr. Elmarie Shiley  Also encouraged him to stay well hydrated with water and avoid NSAIDs  7. Pure hypercholesterolemia  Chronic, controlled  Continue with current medications, tolerating medications well.  - Lipid panel  8. Prediabetes  Chronic, stable  Continue with healthy diet low in sugar and starches - Hemoglobin A1c  9. History of gout  Continue to avoid trigger foods and stay well hydrated with water. - allopurinol (ZYLOPRIM) 100 MG tablet;  Take 1 tablet (100 mg total) by mouth 2 (two) times daily.  Dispense: 180 tablet; Refill: 1 - Uric acid   Patient was given opportunity to ask questions. Patient verbalized understanding of the plan and was able to repeat key elements of the plan. All questions were answered to their satisfaction.    Teola Bradley, FNP, have reviewed all documentation for this visit. The documentation on 01/23/20 for the exam, diagnosis, procedures, and orders are all accurate and complete.   THE PATIENT IS ENCOURAGED TO PRACTICE SOCIAL DISTANCING DUE TO THE COVID-19 PANDEMIC.

## 2020-01-23 NOTE — Patient Instructions (Signed)
Health Maintenance After Age 74 After age 74, you are at a higher risk for certain long-term diseases and infections as well as injuries from falls. Falls are a major cause of broken bones and head injuries in people who are older than age 74. Getting regular preventive care can help to keep you healthy and well. Preventive care includes getting regular testing and making lifestyle changes as recommended by your health care provider. Talk with your health care provider about:  Which screenings and tests you should have. A screening is a test that checks for a disease when you have no symptoms.  A diet and exercise plan that is right for you. What should I know about screenings and tests to prevent falls? Screening and testing are the best ways to find a health problem early. Early diagnosis and treatment give you the best chance of managing medical conditions that are common after age 74. Certain conditions and lifestyle choices may make you more likely to have a fall. Your health care provider may recommend:  Regular vision checks. Poor vision and conditions such as cataracts can make you more likely to have a fall. If you wear glasses, make sure to get your prescription updated if your vision changes.  Medicine review. Work with your health care provider to regularly review all of the medicines you are taking, including over-the-counter medicines. Ask your health care provider about any side effects that may make you more likely to have a fall. Tell your health care provider if any medicines that you take make you feel dizzy or sleepy.  Osteoporosis screening. Osteoporosis is a condition that causes the bones to get weaker. This can make the bones weak and cause them to break more easily.  Blood pressure screening. Blood pressure changes and medicines to control blood pressure can make you feel dizzy.  Strength and balance checks. Your health care provider may recommend certain tests to check your  strength and balance while standing, walking, or changing positions.  Foot health exam. Foot pain and numbness, as well as not wearing proper footwear, can make you more likely to have a fall.  Depression screening. You may be more likely to have a fall if you have a fear of falling, feel emotionally low, or feel unable to do activities that you used to do.  Alcohol use screening. Using too much alcohol can affect your balance and may make you more likely to have a fall. What actions can I take to lower my risk of falls? General instructions  Talk with your health care provider about your risks for falling. Tell your health care provider if: ? You fall. Be sure to tell your health care provider about all falls, even ones that seem minor. ? You feel dizzy, sleepy, or off-balance.  Take over-the-counter and prescription medicines only as told by your health care provider. These include any supplements.  Eat a healthy diet and maintain a healthy weight. A healthy diet includes low-fat dairy products, low-fat (lean) meats, and fiber from whole grains, beans, and lots of fruits and vegetables. Home safety  Remove any tripping hazards, such as rugs, cords, and clutter.  Install safety equipment such as grab bars in bathrooms and safety rails on stairs.  Keep rooms and walkways well-lit. Activity   Follow a regular exercise program to stay fit. This will help you maintain your balance. Ask your health care provider what types of exercise are appropriate for you.  If you need a cane or   walker, use it as recommended by your health care provider.  Wear supportive shoes that have nonskid soles. Lifestyle  Do not drink alcohol if your health care provider tells you not to drink.  If you drink alcohol, limit how much you have: ? 0-1 drink a day for women. ? 0-2 drinks a day for men.  Be aware of how much alcohol is in your drink. In the [place] one drink equals one typical bottle of beer (12  oz), one-half glass of wine (5 oz), or one shot of hard liquor (1 oz).  Do not use any products that contain nicotine or tobacco, such as cigarettes and e-cigarettes. If you need help quitting, ask your health care provider. Summary  Having a healthy lifestyle and getting preventive care can help to protect your health and wellness after age 30.  Screening and testing are the best way to find a health problem early and help you avoid having a fall. Early diagnosis and treatment give you the best chance for managing medical conditions that are more common for people who are older than age 69.  Falls are a major cause of broken bones and head injuries in people who are older than age 81. Take precautions to prevent a fall at home.  Work with your health care provider to learn what changes you can make to improve your health and wellness and to prevent falls. This information is not intended to replace advice given to you by your health care provider. Make sure you discuss any questions you have with your health care provider. Document Revised: 08/12/2018 Document Reviewed: 03/04/2017 Elsevier Patient Education  [place].   Influenza Virus Vaccine (Flucelvax) What is this medicine? INFLUENZA VIRUS VACCINE (in floo EN zuh VAHY ruhs vak SEEN) helps to reduce the risk of getting influenza also known as the flu. The vaccine only helps protect you against some strains of the flu. This medicine may be used for other purposes; ask your health care provider or pharmacist if you have questions. COMMON BRAND NAME(S): FLUCELVAX What should I tell my health care provider before I take this medicine? They need to know if you have any of these conditions:  bleeding disorder like hemophilia  fever or infection  Guillain-Barre syndrome or other neurological problems  immune system problems  infection with the human immunodeficiency virus (HIV) or AIDS  low blood platelet counts  multiple  sclerosis  an unusual or allergic reaction to influenza virus vaccine, other medicines, foods, dyes or preservatives  pregnant or trying to get pregnant  breast-feeding How should I use this medicine? This vaccine is for injection into a muscle. It is given by a health care professional. A copy of Vaccine Information Statements will be given before each vaccination. Read this sheet carefully each time. The sheet may change frequently. Talk to your pediatrician regarding the use of this medicine in children. Special care may be needed. Overdosage: If you think you've taken too much of this medicine contact a poison control center or emergency room at once. Overdosage: If you think you have taken too much of this medicine contact a poison control center or emergency room at once. NOTE: This medicine is only for you. Do not share this medicine with others. What if I miss a dose? This does not apply. What may interact with this medicine?  chemotherapy or radiation therapy  medicines that lower your immune system like etanercept, anakinra, infliximab, and adalimumab  medicines that treat or prevent  blood clots like warfarin  phenytoin  steroid medicines like prednisone or cortisone  theophylline  vaccines This list may not describe all possible interactions. Give your health care provider a list of all the medicines, herbs, non-prescription drugs, or dietary supplements you use. Also tell them if you smoke, drink alcohol, or use illegal drugs. Some items may interact with your medicine. What should I watch for while using this medicine? Report any side effects that do not go away within 3 days to your doctor or health care professional. Call your health care provider if any unusual symptoms occur within 6 weeks of receiving this vaccine. You may still catch the flu, but the illness is not usually as bad. You cannot get the flu from the vaccine. The vaccine will not protect against colds  or other illnesses that may cause fever. The vaccine is needed every year. What side effects may I notice from receiving this medicine? Side effects that you should report to your doctor or health care professional as soon as possible:  allergic reactions like skin rash, itching or hives, swelling of the face, lips, or tongue Side effects that usually do not require medical attention (Report these to your doctor or health care professional if they continue or are bothersome.):  fever  headache  muscle aches and pains  pain, tenderness, redness, or swelling at the injection site  tiredness This list may not describe all possible side effects. Call your doctor for medical advice about side effects. You may report side effects to FDA at 1-800-FDA-1088. Where should I keep my medicine? The vaccine will be given by a health care professional in a clinic, pharmacy, doctor's office, or other health care setting. You will not be given vaccine doses to store at home. NOTE: This sheet is a summary. It may not cover all possible information. If you have questions about this medicine, talk to your doctor, pharmacist, or health care provider.  2020 Elsevier/Gold Standard (2011-04-02 14:06:47)

## 2020-01-24 LAB — URIC ACID: Uric Acid: 5.2 mg/dL (ref 3.8–8.4)

## 2020-01-24 LAB — CMP14+EGFR
ALT: 12 IU/L (ref 0–44)
AST: 15 IU/L (ref 0–40)
Albumin/Globulin Ratio: 1.7 (ref 1.2–2.2)
Albumin: 4.7 g/dL (ref 3.7–4.7)
Alkaline Phosphatase: 67 IU/L (ref 44–121)
BUN/Creatinine Ratio: 17 (ref 10–24)
BUN: 28 mg/dL — ABNORMAL HIGH (ref 8–27)
Bilirubin Total: 0.4 mg/dL (ref 0.0–1.2)
CO2: 24 mmol/L (ref 20–29)
Calcium: 9.8 mg/dL (ref 8.6–10.2)
Chloride: 100 mmol/L (ref 96–106)
Creatinine, Ser: 1.62 mg/dL — ABNORMAL HIGH (ref 0.76–1.27)
GFR calc Af Amer: 48 mL/min/{1.73_m2} — ABNORMAL LOW (ref >59)
GFR calc non Af Amer: 41 mL/min/{1.73_m2} — ABNORMAL LOW (ref >59)
Globulin, Total: 2.7 g/dL (ref 1.5–4.5)
Glucose: 91 mg/dL (ref 65–99)
Potassium: 3.9 mmol/L (ref 3.5–5.2)
Sodium: 137 mmol/L (ref 134–144)
Total Protein: 7.4 g/dL (ref 6.0–8.5)

## 2020-01-24 LAB — CBC
Hematocrit: 49.2 % (ref 37.5–51.0)
Hemoglobin: 16.7 g/dL (ref 13.0–17.7)
MCH: 33 pg (ref 26.6–33.0)
MCHC: 33.9 g/dL (ref 31.5–35.7)
MCV: 97 fL (ref 79–97)
Platelets: 147 10*3/uL — ABNORMAL LOW (ref 150–450)
RBC: 5.06 x10E6/uL (ref 4.14–5.80)
RDW: 14.3 % (ref 11.6–15.4)
WBC: 6.2 10*3/uL (ref 3.4–10.8)

## 2020-01-24 LAB — LIPID PANEL
Chol/HDL Ratio: 4.5 ratio (ref 0.0–5.0)
Cholesterol, Total: 259 mg/dL — ABNORMAL HIGH (ref 100–199)
HDL: 57 mg/dL (ref >39)
LDL Chol Calc (NIH): 181 mg/dL — ABNORMAL HIGH (ref 0–99)
Triglycerides: 119 mg/dL (ref 0–149)
VLDL Cholesterol Cal: 21 mg/dL (ref 5–40)

## 2020-01-24 LAB — HEMOGLOBIN A1C
Est. average glucose Bld gHb Est-mCnc: 120 mg/dL
Hgb A1c MFr Bld: 5.8 % — ABNORMAL HIGH (ref 4.8–5.6)

## 2020-01-24 LAB — PSA: Prostate Specific Ag, Serum: 1 ng/mL (ref 0.0–4.0)

## 2020-02-19 ENCOUNTER — Other Ambulatory Visit: Payer: Self-pay | Admitting: Nurse Practitioner

## 2020-03-21 ENCOUNTER — Other Ambulatory Visit: Payer: Self-pay | Admitting: Nurse Practitioner

## 2020-03-26 ENCOUNTER — Other Ambulatory Visit: Payer: Self-pay | Admitting: Nurse Practitioner

## 2020-03-26 DIAGNOSIS — G8929 Other chronic pain: Secondary | ICD-10-CM

## 2020-03-26 NOTE — Telephone Encounter (Signed)
Methocarbamol refill

## 2020-04-06 ENCOUNTER — Ambulatory Visit: Payer: Medicare Other | Attending: Internal Medicine

## 2020-04-06 ENCOUNTER — Ambulatory Visit: Payer: Self-pay

## 2020-04-06 DIAGNOSIS — Z23 Encounter for immunization: Secondary | ICD-10-CM

## 2020-04-06 NOTE — Progress Notes (Signed)
   Covid-19 Vaccination Clinic  Name:  Ruben [name]    MRN: [id number] DOB: 09/10/1945  04/06/2020  Ruben [name] was observed post Covid-19 immunization for 15 minutes without incident. He was provided with Vaccine Information Sheet and instruction to access the V-Safe system.   Ruben [name] was instructed to call 911 with any severe reactions post vaccine: Marland Kitchen Difficulty breathing  . Swelling of face and throat  . A fast heartbeat  . A bad rash all over body  . Dizziness and weakness   Immunizations Administered    Name Date Dose VIS Date Route   Pfizer COVID-19 Vaccine 04/06/2020  2:48 PM 0.3 mL 02/22/2020 Intramuscular   Manufacturer: [place]   Lot: [id number]   [place]: [id number]

## 2020-06-29 DIAGNOSIS — N183 Chronic kidney disease, stage 3 unspecified: Secondary | ICD-10-CM | POA: Diagnosis not present

## 2020-07-02 DIAGNOSIS — I129 Hypertensive chronic kidney disease with stage 1 through stage 4 chronic kidney disease, or unspecified chronic kidney disease: Secondary | ICD-10-CM | POA: Diagnosis not present

## 2020-07-02 DIAGNOSIS — N183 Chronic kidney disease, stage 3 unspecified: Secondary | ICD-10-CM | POA: Diagnosis not present

## 2020-07-02 DIAGNOSIS — N2581 Secondary hyperparathyroidism of renal origin: Secondary | ICD-10-CM | POA: Diagnosis not present

## 2020-07-06 DIAGNOSIS — N183 Chronic kidney disease, stage 3 unspecified: Secondary | ICD-10-CM | POA: Diagnosis not present

## 2020-07-06 LAB — BASIC METABOLIC PANEL
BUN: 37 — AB (ref 4–21)
CO2: 24 — AB (ref 13–22)
Chloride: 102 (ref 99–108)
Creatinine: 1.5 — AB (ref 0.6–1.3)
Glucose: 139
Potassium: 4.4 (ref 3.4–5.3)
Sodium: 137 (ref 137–147)

## 2020-07-06 LAB — COMPREHENSIVE METABOLIC PANEL
Albumin: 4.5 (ref 3.5–5.0)
Calcium: 9.8 (ref 8.7–10.7)

## 2020-07-23 ENCOUNTER — Ambulatory Visit (INDEPENDENT_AMBULATORY_CARE_PROVIDER_SITE_OTHER): Payer: Medicare Other | Admitting: Nurse Practitioner

## 2020-07-23 ENCOUNTER — Other Ambulatory Visit: Payer: Self-pay

## 2020-07-23 ENCOUNTER — Encounter: Payer: Self-pay | Admitting: Nurse Practitioner

## 2020-07-23 VITALS — BP 116/70 | HR 94 | Temp 98.2°F | Ht 69.8 in | Wt 188.8 lb

## 2020-07-23 DIAGNOSIS — E7800 Pure hypercholesterolemia, unspecified: Secondary | ICD-10-CM

## 2020-07-23 DIAGNOSIS — Z720 Tobacco use: Secondary | ICD-10-CM | POA: Diagnosis not present

## 2020-07-23 DIAGNOSIS — I10 Essential (primary) hypertension: Secondary | ICD-10-CM

## 2020-07-23 DIAGNOSIS — R6889 Other general symptoms and signs: Secondary | ICD-10-CM

## 2020-07-23 NOTE — Progress Notes (Signed)
I,Yamilka Roman [employer] as a Education administrator for [employer], FNP.,have documented all relevant documentation on the behalf of [name], FNP,as directed by  [name], FNP while in the presence of [name], [place]. This visit occurred during the SARS-CoV-2 public health emergency.  Safety protocols were in place, including screening questions prior to the visit, additional usage of staff PPE, and extensive cleaning of exam room while observing appropriate contact time as indicated for disinfecting solutions.  Subjective:     Patient ID: Ruben [name] , male    DOB: 1946/03/28 , 75 y.o.   MRN: [id number]   Chief Complaint  Patient presents with  . Hypertension    HPI  Patient presents today for a f/u on his blood pressure  Wt Readings from Last 3 Encounters: 07/23/20 : 188 lb 12.8 oz (85.6 kg) 01/23/20 : 183 lb (83 kg) 10/24/19 : 181 lb 6.4 oz (82.3 kg)  Hypertension This is a chronic problem. The current episode started more than 1 year ago. The problem is unchanged. The problem is controlled. Pertinent negatives include no anxiety, chest pain, headaches or palpitations. There are no associated agents to hypertension. Risk factors for coronary artery disease include obesity and sedentary lifestyle. Past treatments include diuretics and angiotensin blockers. There are no compliance problems.  There is no history of angina or kidney disease. There is no history of chronic renal disease.  Back Pain This is a chronic problem. The current episode started more than 1 year ago. The problem occurs daily. The problem is unchanged. The pain is present in the thoracic spine and lumbar spine. The quality of the pain is described as cramping and aching. The pain does not radiate. The pain is at a severity of 3/10. The pain is moderate. The pain is the same all the time. The symptoms are aggravated by bending, lying down, sitting and standing. Stiffness is present all day. Pertinent  negatives include no chest pain, fever or headaches. He has tried analgesics for the symptoms. The treatment provided mild relief.     Past Medical History:  Diagnosis Date  . Arthritis   . History of stress test 09/2008   showed inferolateral scar without ischemia  . Hx of echocardiogram 09/2008   was essentially normal  . Hyperlipidemia   . Hypertension      Family History  Problem Relation Age of Onset  . Heart attack Brother   . Diabetes Brother   . Heart disease Brother   . Cancer Father   . Diabetes Brother   . Heart disease Brother      Current Outpatient Medications:  .  allopurinol (ZYLOPRIM) 100 MG tablet, Take 1 tablet (100 mg total) by mouth 2 (two) times daily., Disp: 180 tablet, Rfl: 1 .  aspirin 81 MG tablet, Take 81 mg by mouth daily., Disp: , Rfl:  .  buPROPion (WELLBUTRIN XL) 150 MG 24 hr tablet, Take 1 tablet (150 mg total) by mouth every morning., Disp: 90 tablet, Rfl: 1 .  diclofenac Sodium (VOLTAREN) 1 % GEL, Apply 2 g topically 4 (four) times daily., Disp: 100 g, Rfl: 2 .  ibuprofen (ADVIL) 200 MG tablet, Take 200 mg by mouth every 6 (six) hours as needed. Take 2 tabs one daily, Disp: , Rfl:  .  methocarbamol (ROBAXIN) 500 MG tablet, TAKE 1 TABLET(500 MG) BY MOUTH EVERY 8 HOURS AS NEEDED FOR MUSCLE SPASMS, Disp: 20 tablet, Rfl: 1 .  olmesartan-hydrochlorothiazide (BENICAR HCT) 40-25 MG tablet, Take 1 tablet  by mouth daily., Disp: 90 tablet, Rfl: 1 .  polyethylene glycol powder (GLYCOLAX/MIRALAX) powder, Take 17 g by mouth 2 (two) times daily as needed., Disp: 578 g, Rfl: 1 .  simvastatin (ZOCOR) 40 MG tablet, TAKE 1 TABLET BY MOUTH EVERY DAY IN THE EVENING, Disp: 90 tablet, Rfl: 0 .  tadalafil (CIALIS) 20 MG tablet, TAKE AS DIRECTED, Disp: 6 tablet, Rfl: 3   No Known Allergies   Review of Systems  Constitutional: Negative for activity change, chills, fatigue and fever.  Respiratory: Negative.   Cardiovascular: Negative for chest pain, palpitations and  leg swelling.  Endocrine: Positive for cold intolerance.  Musculoskeletal: Positive for back pain.  Neurological: Negative for headaches.  Psychiatric/Behavioral: Negative.      Today's Vitals   07/23/20 1003  BP: 116/70  Pulse: 94  Temp: 98.2 F (36.8 C)  TempSrc: Oral  Weight: 188 lb 12.8 oz (85.6 kg)  Height: 5' 9.8" (1.773 m)  PainSc: 0-No pain   Body mass index is 27.25 kg/m.   Objective:  Physical Exam Constitutional:      General: He is not in acute distress.    Appearance: Normal appearance. He is normal weight.  Cardiovascular:     Rate and Rhythm: Normal rate and regular rhythm.     Pulses: Normal pulses.     Heart sounds: Normal heart sounds. No murmur heard.   Pulmonary:     Effort: Pulmonary effort is normal. No respiratory distress.     Breath sounds: Normal breath sounds. No wheezing.  Musculoskeletal:     Cervical back: Normal range of motion and neck supple.  Skin:    General: Skin is warm and dry.     Capillary Refill: Capillary refill takes less than 2 seconds.  Neurological:     General: No focal deficit present.     Mental Status: He is alert and oriented to person, place, and time.     Cranial Nerves: No cranial nerve deficit.     Motor: No weakness.  Psychiatric:        Mood and Affect: Mood normal.        Behavior: Behavior normal.        Thought Content: Thought content normal.        Judgment: Judgment normal.         Assessment And Plan:     1. Essential hypertension . B/P is controlled.  . CMP ordered to check renal function.  . The importance of regular exercise and dietary modification was stressed to the patient.  . Stressed importance of losing ten percent of her body weight to help with B/P control.   2. Pure hypercholesterolemia  Chronic, cholesterol levels were slightly elevated at last visit  Continue with current medications - Lipid panel  3. Cold intolerance  Will check thyroid levels and Hgb  - TSH -  CBC  4. Tobacco abuse  He has a 30 Pack per year history of smoking - CT CHEST LUNG CA SCREEN LOW DOSE W/O CM; Future     Patient was given opportunity to ask questions. Patient verbalized understanding of the plan and was able to repeat key elements of the plan. All questions were answered to their satisfaction.  [name], FNP   I, [name], FNP, have reviewed all documentation for this visit. The documentation on 07/23/20 for the exam, diagnosis, procedures, and orders are all accurate and complete.   IF YOU HAVE BEEN REFERRED TO A SPECIALIST, IT MAY TAKE  1-2 WEEKS TO SCHEDULE/PROCESS THE REFERRAL. IF YOU HAVE NOT HEARD FROM US/SPECIALIST IN TWO WEEKS, PLEASE GIVE [place] A CALL AT [phone].   THE PATIENT IS ENCOURAGED TO PRACTICE SOCIAL DISTANCING DUE TO THE COVID-19 PANDEMIC.

## 2020-07-23 NOTE — Patient Instructions (Signed)

## 2020-07-24 LAB — CBC
Hematocrit: 43.9 % (ref 37.5–51.0)
Hemoglobin: 15.3 g/dL (ref 13.0–17.7)
MCH: 32.1 pg (ref 26.6–33.0)
MCHC: 34.9 g/dL (ref 31.5–35.7)
MCV: 92 fL (ref 79–97)
Platelets: 145 10*3/uL — ABNORMAL LOW (ref 150–450)
RBC: 4.77 x10E6/uL (ref 4.14–5.80)
RDW: 13.3 % (ref 11.6–15.4)
WBC: 5.9 10*3/uL (ref 3.4–10.8)

## 2020-07-24 LAB — LIPID PANEL
Chol/HDL Ratio: 3.7 ratio (ref 0.0–5.0)
Cholesterol, Total: 185 mg/dL (ref 100–199)
HDL: 50 mg/dL (ref >39)
LDL Chol Calc (NIH): 117 mg/dL — ABNORMAL HIGH (ref 0–99)
Triglycerides: 102 mg/dL (ref 0–149)
VLDL Cholesterol Cal: 18 mg/dL (ref 5–40)

## 2020-07-24 LAB — TSH: TSH: 1.06 u[IU]/mL (ref 0.450–4.500)

## 2020-07-25 ENCOUNTER — Encounter: Payer: Self-pay | Admitting: Nurse Practitioner

## 2020-07-28 ENCOUNTER — Other Ambulatory Visit: Payer: Self-pay | Admitting: Nurse Practitioner

## 2020-07-31 ENCOUNTER — Other Ambulatory Visit: Payer: Self-pay

## 2020-07-31 MED ORDER — SIMVASTATIN 40 MG PO TABS: 40.0000 mg | ORAL_TABLET | Freq: Every evening | ORAL | 2 refills | Status: DC

## 2020-08-21 ENCOUNTER — Other Ambulatory Visit: Payer: Self-pay | Admitting: Nurse Practitioner

## 2020-08-21 DIAGNOSIS — I10 Essential (primary) hypertension: Secondary | ICD-10-CM

## 2020-10-22 ENCOUNTER — Encounter: Payer: Self-pay | Admitting: Nurse Practitioner

## 2020-10-22 ENCOUNTER — Ambulatory Visit (INDEPENDENT_AMBULATORY_CARE_PROVIDER_SITE_OTHER): Payer: Medicare Other | Admitting: Nurse Practitioner

## 2020-10-22 ENCOUNTER — Other Ambulatory Visit: Payer: Self-pay

## 2020-10-22 VITALS — BP 118/70 | HR 87 | Temp 98.3°F | Wt 181.2 lb

## 2020-10-22 DIAGNOSIS — Z23 Encounter for immunization: Secondary | ICD-10-CM

## 2020-10-22 DIAGNOSIS — E7800 Pure hypercholesterolemia, unspecified: Secondary | ICD-10-CM | POA: Diagnosis not present

## 2020-10-22 DIAGNOSIS — I10 Essential (primary) hypertension: Secondary | ICD-10-CM | POA: Diagnosis not present

## 2020-10-22 DIAGNOSIS — Z8739 Personal history of other diseases of the musculoskeletal system and connective tissue: Secondary | ICD-10-CM

## 2020-10-22 DIAGNOSIS — G8929 Other chronic pain: Secondary | ICD-10-CM | POA: Diagnosis not present

## 2020-10-22 DIAGNOSIS — M549 Dorsalgia, unspecified: Secondary | ICD-10-CM | POA: Diagnosis not present

## 2020-10-22 MED ORDER — SHINGRIX 50 MCG/0.5ML IM SUSR
0.5000 mL | Freq: Once | INTRAMUSCULAR | 0 refills | Status: AC
Start: 2020-10-22 — End: 2020-10-22

## 2020-10-22 MED ORDER — MAGNESIUM 200 MG PO TABS: 1.0000 | ORAL_TABLET | Freq: Every day | ORAL | 2 refills | Status: DC

## 2020-10-22 MED ORDER — OLMESARTAN MEDOXOMIL-HCTZ 40-25 MG PO TABS: 1.0000 | ORAL_TABLET | Freq: Every day | ORAL | 1 refills | Status: DC

## 2020-10-22 MED ORDER — METHOCARBAMOL 500 MG PO TABS: ORAL_TABLET | ORAL | 1 refills | Status: DC

## 2020-10-22 MED ORDER — ALLOPURINOL 100 MG PO TABS
100.0000 mg | ORAL_TABLET | Freq: Two times a day (BID) | ORAL | 1 refills | Status: DC
Start: 2020-10-22 — End: 2021-04-15

## 2020-10-22 MED ORDER — SIMVASTATIN 40 MG PO TABS: 40.0000 mg | ORAL_TABLET | Freq: Every evening | ORAL | 2 refills | Status: DC

## 2020-10-22 NOTE — Patient Instructions (Signed)

## 2020-10-22 NOTE — Progress Notes (Signed)
I,Yamilka Roman [employer] as a Education administrator for [employer], FNP.,have documented all relevant documentation on the behalf of [name], FNP,as directed by  [name], FNP while in the presence of [name], [place].  This visit occurred during the SARS-CoV-2 public health emergency.  Safety protocols were in place, including screening questions prior to the visit, additional usage of staff PPE, and extensive cleaning of exam room while observing appropriate contact time as indicated for disinfecting solutions.  Subjective:     Patient ID: Ruben [name] , male    DOB: [birth year] , 75 y.o.   MRN: [id number]   Chief Complaint  Patient presents with   Hypertension    HPI  Patient presents today for a f/u on his blood pressure.    Wt Readings from Last 3 Encounters: 10/22/20 : 181 lb 3.2 oz (82.2 kg) 07/23/20 : 188 lb 12.8 oz (85.6 kg) 01/23/20 : 183 lb (83 kg)    Hypertension This is a chronic problem. The current episode started more than 1 year ago. The problem is unchanged. The problem is controlled. Pertinent negatives include no anxiety, chest pain, headaches, neck pain, palpitations or shortness of breath. There are no associated agents to hypertension. Risk factors for coronary artery disease include obesity and sedentary lifestyle. Past treatments include diuretics and angiotensin blockers. There are no compliance problems.  There is no history of angina or kidney disease. There is no history of chronic renal disease.  Back Pain This is a chronic problem. The current episode started more than 1 year ago. The problem occurs daily. The problem is unchanged. The pain is present in the thoracic spine and lumbar spine. The quality of the pain is described as cramping and aching. The pain does not radiate. The pain is at a severity of 3/10. The pain is moderate. The pain is The same all the time. The symptoms are aggravated by bending, lying down, sitting and standing. Stiffness  is present All day. Pertinent negatives include no chest pain, fever or headaches. He has tried analgesics for the symptoms. The treatment provided mild relief.    Past Medical History:  Diagnosis Date   Arthritis    History of stress test 09/2008   showed inferolateral scar without ischemia   Hx of echocardiogram 09/2008   was essentially normal   Hyperlipidemia    Hypertension      Family History  Problem Relation Age of Onset   Heart attack Brother    Diabetes Brother    Heart disease Brother    Cancer Father    Diabetes Brother    Heart disease Brother      Current Outpatient Medications:    aspirin 81 MG tablet, Take 81 mg by mouth daily., Disp: , Rfl:    buPROPion (WELLBUTRIN XL) 150 MG 24 hr tablet, Take 1 tablet (150 mg total) by mouth every morning., Disp: 90 tablet, Rfl: 1   diclofenac Sodium (VOLTAREN) 1 % GEL, Apply 2 g topically 4 (four) times daily., Disp: 100 g, Rfl: 2   ibuprofen (ADVIL) 200 MG tablet, Take 200 mg by mouth every 6 (six) hours as needed. Take 2 tabs one daily, Disp: , Rfl:    Magnesium 200 MG TABS, Take 1 tablet (200 mg total) by mouth daily. With evening meal, Disp: 30 tablet, Rfl: 2   polyethylene glycol powder (GLYCOLAX/MIRALAX) powder, Take 17 g by mouth 2 (two) times daily as needed., Disp: 578 g, Rfl: 1   tadalafil (CIALIS) 20 MG  tablet, TAKE AS DIRECTED, Disp: 6 tablet, Rfl: 3   allopurinol (ZYLOPRIM) 100 MG tablet, Take 1 tablet (100 mg total) by mouth 2 (two) times daily., Disp: 180 tablet, Rfl: 1   methocarbamol (ROBAXIN) 500 MG tablet, TAKE 1 TABLET(500 MG) BY MOUTH EVERY 8 HOURS AS NEEDED FOR MUSCLE SPASMS, Disp: 20 tablet, Rfl: 1   olmesartan-hydrochlorothiazide (BENICAR HCT) 40-25 MG tablet, Take 1 tablet by mouth daily., Disp: 90 tablet, Rfl: 1   simvastatin (ZOCOR) 40 MG tablet, Take 1 tablet (40 mg total) by mouth every evening., Disp: 90 tablet, Rfl: 2   No Known Allergies   Review of Systems  Constitutional: Negative.   Negative for fever.  Respiratory: Negative.  Negative for cough, shortness of breath and wheezing.   Cardiovascular:  Negative for chest pain, palpitations and leg swelling.  Musculoskeletal:  Positive for back pain (upper shoulders down to waist - will take tylenol occasionally). Negative for neck pain.  Neurological:  Negative for headaches.  Psychiatric/Behavioral: Negative.      Today's Vitals   10/22/20 0955  BP: 118/70  Pulse: 87  Temp: 98.3 F (36.8 C)  Weight: 181 lb 3.2 oz (82.2 kg)  PainSc: 9   PainLoc: Back   Body mass index is 26.15 kg/m.   Objective:  Physical Exam Vitals reviewed.  Constitutional:      General: He is not in acute distress.    Appearance: Normal appearance. He is normal weight.  Cardiovascular:     Rate and Rhythm: Normal rate and regular rhythm.     Pulses: Normal pulses.     Heart sounds: Normal heart sounds. No murmur heard. Pulmonary:     Effort: Pulmonary effort is normal. No respiratory distress.     Breath sounds: Normal breath sounds. No wheezing.  Musculoskeletal:     Cervical back: Normal range of motion and neck supple.  Skin:    General: Skin is warm and dry.     Capillary Refill: Capillary refill takes less than 2 seconds.  Neurological:     General: No focal deficit present.     Mental Status: He is alert and oriented to person, place, and time.     Cranial Nerves: No cranial nerve deficit.     Motor: No weakness.  Psychiatric:        Mood and Affect: Mood normal.        Behavior: Behavior normal.        Thought Content: Thought content normal.        Judgment: Judgment normal.        Assessment And Plan:     1. Essential hypertension Comments: Excellent control, continue current medications - olmesartan-hydrochlorothiazide (BENICAR HCT) 40-25 MG tablet; Take 1 tablet by mouth daily.  Dispense: 90 tablet; Refill: 1 - CMP14+EGFR  2. Pure hypercholesterolemia Comments: Stable, continue current medications, tolerating  well - simvastatin (ZOCOR) 40 MG tablet; Take 1 tablet (40 mg total) by mouth every evening.  Dispense: 90 tablet; Refill: 2 - Lipid panel - CMP14+EGFR  3. Chronic bilateral back pain, unspecified back location Comments: Negative radiculopathy, muscle tension present to low back area will treat with muscle relaxer - methocarbamol (ROBAXIN) 500 MG tablet; TAKE 1 TABLET(500 MG) BY MOUTH EVERY 8 HOURS AS NEEDED FOR MUSCLE SPASMS  Dispense: 20 tablet; Refill: 1  4. History of gout Comments: No recent exacerbations - allopurinol (ZYLOPRIM) 100 MG tablet; Take 1 tablet (100 mg total) by mouth 2 (two) times daily.  Dispense: 180 tablet;  Refill: 1  5. Encounter for immunization Rx sent to pharmacy - Zoster Vaccine Adjuvanted [facility]) injection; Inject 0.5 mLs into the muscle once for 1 dose.  Dispense: 0.5 mL; Refill: 0    Patient was given opportunity to ask questions. Patient verbalized understanding of the plan and was able to repeat key elements of the plan. All questions were answered to their satisfaction.  [name], FNP   I, [name], FNP, have reviewed all documentation for this visit. The documentation on 10/22/20 for the exam, diagnosis, procedures, and orders are all accurate and complete.   IF YOU HAVE BEEN REFERRED TO A SPECIALIST, IT MAY TAKE 1-2 WEEKS TO SCHEDULE/PROCESS THE REFERRAL. IF YOU HAVE NOT HEARD FROM US/SPECIALIST IN TWO WEEKS, PLEASE GIVE [place] A CALL AT [phone].   THE PATIENT IS ENCOURAGED TO PRACTICE SOCIAL DISTANCING DUE TO THE COVID-19 PANDEMIC.

## 2020-10-23 LAB — CMP14+EGFR
ALT: 10 IU/L (ref 0–44)
AST: 19 IU/L (ref 0–40)
Albumin/Globulin Ratio: 1.6 (ref 1.2–2.2)
Albumin: 4.7 g/dL (ref 3.7–4.7)
Alkaline Phosphatase: 73 IU/L (ref 44–121)
BUN/Creatinine Ratio: 15 (ref 10–24)
BUN: 21 mg/dL (ref 8–27)
Bilirubin Total: 1 mg/dL (ref 0.0–1.2)
CO2: 22 mmol/L (ref 20–29)
Calcium: 10.2 mg/dL (ref 8.6–10.2)
Chloride: 101 mmol/L (ref 96–106)
Creatinine, Ser: 1.41 mg/dL — ABNORMAL HIGH (ref 0.76–1.27)
Globulin, Total: 2.9 g/dL (ref 1.5–4.5)
Glucose: 93 mg/dL (ref 65–99)
Potassium: 4.9 mmol/L (ref 3.5–5.2)
Sodium: 141 mmol/L (ref 134–144)
Total Protein: 7.6 g/dL (ref 6.0–8.5)
eGFR: 52 mL/min/{1.73_m2} — ABNORMAL LOW (ref >59)

## 2020-10-23 LAB — LIPID PANEL
Chol/HDL Ratio: 3.7 ratio (ref 0.0–5.0)
Cholesterol, Total: 221 mg/dL — ABNORMAL HIGH (ref 100–199)
HDL: 60 mg/dL (ref >39)
LDL Chol Calc (NIH): 147 mg/dL — ABNORMAL HIGH (ref 0–99)
Triglycerides: 82 mg/dL (ref 0–149)
VLDL Cholesterol Cal: 14 mg/dL (ref 5–40)

## 2020-10-24 DIAGNOSIS — Z03818 Encounter for observation for suspected exposure to other biological agents ruled out: Secondary | ICD-10-CM | POA: Diagnosis not present

## 2020-10-29 DIAGNOSIS — U071 COVID-19: Secondary | ICD-10-CM | POA: Diagnosis not present

## 2020-11-23 ENCOUNTER — Encounter: Payer: Self-pay | Admitting: Nurse Practitioner

## 2020-11-28 DIAGNOSIS — U071 COVID-19: Secondary | ICD-10-CM | POA: Diagnosis not present

## 2020-11-28 DIAGNOSIS — Z20822 Contact with and (suspected) exposure to covid-19: Secondary | ICD-10-CM | POA: Diagnosis not present

## 2020-12-19 ENCOUNTER — Other Ambulatory Visit: Payer: Self-pay | Admitting: Nurse Practitioner

## 2020-12-19 ENCOUNTER — Encounter: Payer: Self-pay | Admitting: Nurse Practitioner

## 2020-12-19 MED ORDER — TADALAFIL 5 MG PO TABS: 5.0000 mg | ORAL_TABLET | ORAL | 2 refills | Status: DC | PRN

## 2021-01-10 DIAGNOSIS — Z87891 Personal history of nicotine dependence: Secondary | ICD-10-CM | POA: Diagnosis not present

## 2021-01-10 DIAGNOSIS — F17210 Nicotine dependence, cigarettes, uncomplicated: Secondary | ICD-10-CM | POA: Diagnosis not present

## 2021-01-14 ENCOUNTER — Ambulatory Visit: Payer: Medicare Other | Admitting: Nurse Practitioner

## 2021-01-14 ENCOUNTER — Telehealth: Payer: Self-pay

## 2021-01-14 NOTE — Telephone Encounter (Signed)
I called the pt and left a message on his cell phone that Laurance Flatten, DNP, FNP-BC said that the pt's CT of chest/lungs is normal.

## 2021-01-22 ENCOUNTER — Ambulatory Visit: Payer: BC Managed Care – PPO | Admitting: Nurse Practitioner

## 2021-02-18 ENCOUNTER — Encounter: Payer: Self-pay | Admitting: Nurse Practitioner

## 2021-02-18 ENCOUNTER — Other Ambulatory Visit: Payer: Self-pay

## 2021-02-18 ENCOUNTER — Ambulatory Visit (INDEPENDENT_AMBULATORY_CARE_PROVIDER_SITE_OTHER): Payer: Medicare Other | Admitting: Nurse Practitioner

## 2021-02-18 VITALS — BP 118/68 | HR 88 | Temp 98.1°F | Ht 71.4 in | Wt 180.8 lb

## 2021-02-18 DIAGNOSIS — I129 Hypertensive chronic kidney disease with stage 1 through stage 4 chronic kidney disease, or unspecified chronic kidney disease: Secondary | ICD-10-CM

## 2021-02-18 DIAGNOSIS — M545 Low back pain, unspecified: Secondary | ICD-10-CM

## 2021-02-18 DIAGNOSIS — I10 Essential (primary) hypertension: Secondary | ICD-10-CM | POA: Diagnosis not present

## 2021-02-18 DIAGNOSIS — G8929 Other chronic pain: Secondary | ICD-10-CM

## 2021-02-18 DIAGNOSIS — N183 Chronic kidney disease, stage 3 unspecified: Secondary | ICD-10-CM

## 2021-02-18 DIAGNOSIS — E7800 Pure hypercholesterolemia, unspecified: Secondary | ICD-10-CM | POA: Diagnosis not present

## 2021-02-18 DIAGNOSIS — Z135 Encounter for screening for eye and ear disorders: Secondary | ICD-10-CM

## 2021-02-18 DIAGNOSIS — Z23 Encounter for immunization: Secondary | ICD-10-CM

## 2021-02-18 DIAGNOSIS — Z0100 Encounter for examination of eyes and vision without abnormal findings: Secondary | ICD-10-CM

## 2021-02-18 MED ORDER — MELOXICAM 15 MG PO TBDP: 1.0000 | ORAL_TABLET | Freq: Every day | ORAL | 1 refills | Status: DC

## 2021-02-18 NOTE — Patient Instructions (Signed)

## 2021-02-18 NOTE — Progress Notes (Signed)
I,Tianna Badgett,acting as a Education administrator for [employer], FNP.,have documented all relevant documentation on the behalf of [name], FNP,as directed by  [name], FNP while in the presence of [name], [place].  This visit occurred during the SARS-CoV-2 public health emergency.  Safety protocols were in place, including screening questions prior to the visit, additional usage of staff PPE, and extensive cleaning of exam room while observing appropriate contact time as indicated for disinfecting solutions.  Subjective:     Patient ID: Ruben [name] , male    DOB: Dec 11, 1945 , 75 y.o.   MRN: [id number]   Chief Complaint  Patient presents with   Hypertension    HPI  Patient presents today for a f/u on his blood pressure.  He had his low dose CT scan done at [facility]  Hypertension This is a chronic problem. The current episode started more than 1 year ago. The problem is unchanged. The problem is controlled. Pertinent negatives include no chest pain, headaches, palpitations or shortness of breath. There are no associated agents to hypertension. Risk factors for coronary artery disease include obesity and sedentary lifestyle. Past treatments include diuretics and angiotensin blockers. There are no compliance problems.  There is no history of angina or kidney disease. There is no history of chronic renal disease.    Past Medical History:  Diagnosis Date   Arthritis    History of stress test 09/2008   showed inferolateral scar without ischemia   Hx of echocardiogram 09/2008   was essentially normal   Hyperlipidemia    Hypertension      Family History  Problem Relation Age of Onset   Heart attack Brother    Diabetes Brother    Heart disease Brother    Cancer Father    Diabetes Brother    Heart disease Brother      Current Outpatient Medications:    Meloxicam 15 MG TBDP, Take 1 tablet by mouth daily. For 5 days then take once a day as needed, Disp: 30 tablet, Rfl:  1   allopurinol (ZYLOPRIM) 100 MG tablet, Take 1 tablet (100 mg total) by mouth 2 (two) times daily., Disp: 180 tablet, Rfl: 1   aspirin 81 MG tablet, Take 81 mg by mouth daily., Disp: , Rfl:    buPROPion (WELLBUTRIN XL) 150 MG 24 hr tablet, Take 1 tablet (150 mg total) by mouth every morning., Disp: 90 tablet, Rfl: 1   diclofenac Sodium (VOLTAREN) 1 % GEL, Apply 2 g topically 4 (four) times daily., Disp: 100 g, Rfl: 2   ibuprofen (ADVIL) 200 MG tablet, Take 200 mg by mouth every 6 (six) hours as needed. Take 2 tabs one daily, Disp: , Rfl:    Magnesium 200 MG TABS, Take 1 tablet (200 mg total) by mouth daily. With evening meal, Disp: 30 tablet, Rfl: 2   methocarbamol (ROBAXIN) 500 MG tablet, TAKE 1 TABLET(500 MG) BY MOUTH EVERY 8 HOURS AS NEEDED FOR MUSCLE SPASMS, Disp: 20 tablet, Rfl: 1   olmesartan-hydrochlorothiazide (BENICAR HCT) 40-25 MG tablet, Take 1 tablet by mouth daily., Disp: 90 tablet, Rfl: 1   polyethylene glycol powder (GLYCOLAX/MIRALAX) powder, Take 17 g by mouth 2 (two) times daily as needed., Disp: 578 g, Rfl: 1   simvastatin (ZOCOR) 40 MG tablet, Take 1 tablet (40 mg total) by mouth every evening., Disp: 90 tablet, Rfl: 2   tadalafil (CIALIS) 5 MG tablet, Take 1 tablet (5 mg total) by mouth as needed for erectile dysfunction., Disp: 30  tablet, Rfl: 2   No Known Allergies   Review of Systems  Constitutional: Negative.   Respiratory: Negative.  Negative for shortness of breath.   Cardiovascular: Negative.  Negative for chest pain, palpitations and leg swelling.  Neurological:  Negative for dizziness and headaches.  Psychiatric/Behavioral: Negative.      Today's Vitals   02/18/21 1058  BP: 118/68  Pulse: 88  Temp: 98.1 F (36.7 C)  TempSrc: Oral  Weight: 180 lb 12.8 oz (82 kg)  Height: 5' 11.4" (1.814 m)   Body mass index is 24.93 kg/m.   Objective:  Physical Exam Vitals reviewed.  Constitutional:      General: He is not in acute distress.    Appearance: Normal  appearance. He is normal weight.  Cardiovascular:     Rate and Rhythm: Normal rate and regular rhythm.     Pulses: Normal pulses.     Heart sounds: Normal heart sounds. No murmur heard. Pulmonary:     Effort: Pulmonary effort is normal. No respiratory distress.     Breath sounds: Normal breath sounds. No wheezing.  Musculoskeletal:     Cervical back: Normal range of motion and neck supple.  Skin:    General: Skin is warm and dry.     Capillary Refill: Capillary refill takes less than 2 seconds.  Neurological:     General: No focal deficit present.     Mental Status: He is alert and oriented to person, place, and time.     Cranial Nerves: No cranial nerve deficit.     Motor: No weakness.  Psychiatric:        Mood and Affect: Mood normal.        Behavior: Behavior normal.        Thought Content: Thought content normal.        Judgment: Judgment normal.        Assessment And Plan:     1. Essential hypertension Comments: Blood pressure is well controlled Continue current medications  - CMP14+EGFR  2. Pure hypercholesterolemia Comments: Stable, continue current medications and tolerating statin well - CMP14+EGFR - Lipid panel  3. Chronic bilateral low back pain without sciatica Comments: Having more continuous back pain, offered referral to PT but he would like to wait until after having an xray. Encouraged to stretch regularly  - DG Lumbar Spine Complete; Future - Meloxicam 15 MG TBDP; Take 1 tablet by mouth daily. For 5 days then take once a day as needed  Dispense: 30 tablet; Refill: 1  4. Encounter for eye exam Comments: He reports the [facility] NP that made a home visit felt he had cataracts, denies any changes with his vision.  - Ambulatory referral to Ophthalmology  5. Need for influenza vaccination Influenza vaccine administered Encouraged to take Tylenol as needed for fever or muscle aches. - Flu Vaccine QUAD High Dose(Fluad)     Patient was given opportunity to  ask questions. Patient verbalized understanding of the plan and was able to repeat key elements of the plan. All questions were answered to their satisfaction.  [name], FNP   I, [name], FNP, have reviewed all documentation for this visit. The documentation on 02/18/21 for the exam, diagnosis, procedures, and orders are all accurate and complete.   IF YOU HAVE BEEN REFERRED TO A SPECIALIST, IT MAY TAKE 1-2 WEEKS TO SCHEDULE/PROCESS THE REFERRAL. IF YOU HAVE NOT HEARD FROM US/SPECIALIST IN TWO WEEKS, PLEASE GIVE [place] A CALL AT [phone].   THE  PATIENT IS ENCOURAGED TO PRACTICE SOCIAL DISTANCING DUE TO THE COVID-19 PANDEMIC.

## 2021-02-19 LAB — CMP14+EGFR
ALT: 11 IU/L (ref 0–44)
AST: 20 IU/L (ref 0–40)
Albumin/Globulin Ratio: 1.6 (ref 1.2–2.2)
Albumin: 4.6 g/dL (ref 3.7–4.7)
Alkaline Phosphatase: 64 IU/L (ref 44–121)
BUN/Creatinine Ratio: 19 (ref 10–24)
BUN: 28 mg/dL — ABNORMAL HIGH (ref 8–27)
Bilirubin Total: 0.8 mg/dL (ref 0.0–1.2)
CO2: 23 mmol/L (ref 20–29)
Calcium: 9.9 mg/dL (ref 8.6–10.2)
Chloride: 102 mmol/L (ref 96–106)
Creatinine, Ser: 1.48 mg/dL — ABNORMAL HIGH (ref 0.76–1.27)
Globulin, Total: 2.9 g/dL (ref 1.5–4.5)
Glucose: 98 mg/dL (ref 70–99)
Potassium: 4.8 mmol/L (ref 3.5–5.2)
Sodium: 141 mmol/L (ref 134–144)
Total Protein: 7.5 g/dL (ref 6.0–8.5)
eGFR: 49 mL/min/{1.73_m2} — ABNORMAL LOW (ref >59)

## 2021-02-19 LAB — LIPID PANEL
Chol/HDL Ratio: 3.7 ratio (ref 0.0–5.0)
Cholesterol, Total: 202 mg/dL — ABNORMAL HIGH (ref 100–199)
HDL: 55 mg/dL (ref >39)
LDL Chol Calc (NIH): 125 mg/dL — ABNORMAL HIGH (ref 0–99)
Triglycerides: 124 mg/dL (ref 0–149)
VLDL Cholesterol Cal: 22 mg/dL (ref 5–40)

## 2021-02-19 MED ORDER — OLMESARTAN MEDOXOMIL 40 MG PO TABS: 40.0000 mg | ORAL_TABLET | Freq: Every day | ORAL | 2 refills | Status: DC

## 2021-02-25 ENCOUNTER — Ambulatory Visit
Admission: RE | Admit: 2021-02-25 | Discharge: 2021-02-25 | Disposition: A | Discharge status: Home / Self Care | Payer: Medicare Other | Source: Ambulatory Visit | Attending: Nurse Practitioner | Admitting: Nurse Practitioner

## 2021-02-25 DIAGNOSIS — G8929 Other chronic pain: Secondary | ICD-10-CM

## 2021-02-25 DIAGNOSIS — M545 Low back pain, unspecified: Secondary | ICD-10-CM | POA: Diagnosis not present

## 2021-03-10 ENCOUNTER — Other Ambulatory Visit: Payer: Self-pay | Admitting: Nurse Practitioner

## 2021-03-10 DIAGNOSIS — I10 Essential (primary) hypertension: Secondary | ICD-10-CM

## 2021-03-19 DIAGNOSIS — I129 Hypertensive chronic kidney disease with stage 1 through stage 4 chronic kidney disease, or unspecified chronic kidney disease: Secondary | ICD-10-CM | POA: Diagnosis not present

## 2021-03-19 DIAGNOSIS — N183 Chronic kidney disease, stage 3 unspecified: Secondary | ICD-10-CM | POA: Diagnosis not present

## 2021-03-19 DIAGNOSIS — N189 Chronic kidney disease, unspecified: Secondary | ICD-10-CM | POA: Diagnosis not present

## 2021-03-19 DIAGNOSIS — N2581 Secondary hyperparathyroidism of renal origin: Secondary | ICD-10-CM | POA: Diagnosis not present

## 2021-03-26 ENCOUNTER — Encounter: Payer: Self-pay | Admitting: Nurse Practitioner

## 2021-04-03 ENCOUNTER — Other Ambulatory Visit: Payer: Self-pay | Admitting: Nurse Practitioner

## 2021-04-03 MED ORDER — ALBUTEROL SULFATE (2.5 MG/3ML) 0.083% IN NEBU: 2.5000 mg | INHALATION_SOLUTION | RESPIRATORY_TRACT | 2 refills | Status: DC | PRN

## 2021-04-14 ENCOUNTER — Other Ambulatory Visit: Payer: Self-pay | Admitting: Nurse Practitioner

## 2021-04-14 DIAGNOSIS — Z8739 Personal history of other diseases of the musculoskeletal system and connective tissue: Secondary | ICD-10-CM

## 2021-04-18 ENCOUNTER — Telehealth: Payer: Self-pay | Admitting: Nurse Practitioner

## 2021-04-18 NOTE — Telephone Encounter (Signed)
Left message for patient to call back and schedule Medicare Annual Wellness Visit (AWV) either virtually or in office.  Left both my jabber number [phone] and office number   AWV 02/02/21 per [place] please schedule at anytime with [facility]    This should be a 45 minute visit.

## 2021-04-29 ENCOUNTER — Encounter (HOSPITAL_COMMUNITY): Payer: Self-pay

## 2021-04-29 ENCOUNTER — Emergency Department (HOSPITAL_COMMUNITY): Payer: Medicare Other

## 2021-04-29 ENCOUNTER — Ambulatory Visit
Admission: EM | Admit: 2021-04-29 | Discharge: 2021-04-29 | Disposition: A | Discharge status: Other Acute Inpatient Hospital | Payer: Medicare Other | Attending: Internal Medicine | Admitting: Internal Medicine

## 2021-04-29 ENCOUNTER — Inpatient Hospital Stay (HOSPITAL_COMMUNITY)
Admission: EM | Admit: 2021-04-29 | Discharge: 2021-05-28 | DRG: 264 | Disposition: A | Discharge status: Home / Self Care | Payer: Medicare Other | Source: Ambulatory Visit | Attending: Cardiology | Admitting: Cardiology

## 2021-04-29 ENCOUNTER — Other Ambulatory Visit: Payer: Self-pay

## 2021-04-29 DIAGNOSIS — I4819 Other persistent atrial fibrillation: Secondary | ICD-10-CM | POA: Diagnosis not present

## 2021-04-29 DIAGNOSIS — F32A Depression, unspecified: Secondary | ICD-10-CM | POA: Diagnosis present

## 2021-04-29 DIAGNOSIS — I340 Nonrheumatic mitral (valve) insufficiency: Secondary | ICD-10-CM | POA: Diagnosis not present

## 2021-04-29 DIAGNOSIS — I7121 Aneurysm of the ascending aorta, without rupture: Secondary | ICD-10-CM | POA: Diagnosis present

## 2021-04-29 DIAGNOSIS — I129 Hypertensive chronic kidney disease with stage 1 through stage 4 chronic kidney disease, or unspecified chronic kidney disease: Secondary | ICD-10-CM | POA: Diagnosis not present

## 2021-04-29 DIAGNOSIS — E876 Hypokalemia: Secondary | ICD-10-CM | POA: Diagnosis present

## 2021-04-29 DIAGNOSIS — N189 Chronic kidney disease, unspecified: Secondary | ICD-10-CM | POA: Diagnosis not present

## 2021-04-29 DIAGNOSIS — D72829 Elevated white blood cell count, unspecified: Secondary | ICD-10-CM | POA: Diagnosis present

## 2021-04-29 DIAGNOSIS — Z7901 Long term (current) use of anticoagulants: Secondary | ICD-10-CM | POA: Diagnosis not present

## 2021-04-29 DIAGNOSIS — R570 Cardiogenic shock: Secondary | ICD-10-CM | POA: Diagnosis not present

## 2021-04-29 DIAGNOSIS — Z79899 Other long term (current) drug therapy: Secondary | ICD-10-CM | POA: Diagnosis not present

## 2021-04-29 DIAGNOSIS — E43 Unspecified severe protein-calorie malnutrition: Secondary | ICD-10-CM | POA: Diagnosis not present

## 2021-04-29 DIAGNOSIS — F1010 Alcohol abuse, uncomplicated: Secondary | ICD-10-CM | POA: Diagnosis present

## 2021-04-29 DIAGNOSIS — Z20822 Contact with and (suspected) exposure to covid-19: Secondary | ICD-10-CM | POA: Diagnosis not present

## 2021-04-29 DIAGNOSIS — I132 Hypertensive heart and chronic kidney disease with heart failure and with stage 5 chronic kidney disease, or end stage renal disease: Secondary | ICD-10-CM | POA: Diagnosis not present

## 2021-04-29 DIAGNOSIS — Z7982 Long term (current) use of aspirin: Secondary | ICD-10-CM

## 2021-04-29 DIAGNOSIS — I214 Non-ST elevation (NSTEMI) myocardial infarction: Secondary | ICD-10-CM | POA: Diagnosis not present

## 2021-04-29 DIAGNOSIS — E1165 Type 2 diabetes mellitus with hyperglycemia: Secondary | ICD-10-CM | POA: Diagnosis present

## 2021-04-29 DIAGNOSIS — I480 Paroxysmal atrial fibrillation: Secondary | ICD-10-CM | POA: Diagnosis not present

## 2021-04-29 DIAGNOSIS — I255 Ischemic cardiomyopathy: Secondary | ICD-10-CM | POA: Diagnosis present

## 2021-04-29 DIAGNOSIS — I4891 Unspecified atrial fibrillation: Secondary | ICD-10-CM | POA: Diagnosis not present

## 2021-04-29 DIAGNOSIS — N186 End stage renal disease: Secondary | ICD-10-CM | POA: Diagnosis not present

## 2021-04-29 DIAGNOSIS — R9431 Abnormal electrocardiogram [ECG] [EKG]: Secondary | ICD-10-CM | POA: Diagnosis not present

## 2021-04-29 DIAGNOSIS — R0602 Shortness of breath: Secondary | ICD-10-CM

## 2021-04-29 DIAGNOSIS — Z95828 Presence of other vascular implants and grafts: Secondary | ICD-10-CM

## 2021-04-29 DIAGNOSIS — R062 Wheezing: Secondary | ICD-10-CM

## 2021-04-29 DIAGNOSIS — N182 Chronic kidney disease, stage 2 (mild): Secondary | ICD-10-CM | POA: Diagnosis not present

## 2021-04-29 DIAGNOSIS — N183 Chronic kidney disease, stage 3 unspecified: Secondary | ICD-10-CM | POA: Diagnosis not present

## 2021-04-29 DIAGNOSIS — N171 Acute kidney failure with acute cortical necrosis: Secondary | ICD-10-CM | POA: Diagnosis not present

## 2021-04-29 DIAGNOSIS — I161 Hypertensive emergency: Secondary | ICD-10-CM | POA: Diagnosis not present

## 2021-04-29 DIAGNOSIS — N200 Calculus of kidney: Secondary | ICD-10-CM | POA: Diagnosis not present

## 2021-04-29 DIAGNOSIS — E871 Hypo-osmolality and hyponatremia: Secondary | ICD-10-CM | POA: Diagnosis present

## 2021-04-29 DIAGNOSIS — I080 Rheumatic disorders of both mitral and aortic valves: Secondary | ICD-10-CM | POA: Diagnosis present

## 2021-04-29 DIAGNOSIS — Z8249 Family history of ischemic heart disease and other diseases of the circulatory system: Secondary | ICD-10-CM

## 2021-04-29 DIAGNOSIS — N1831 Chronic kidney disease, stage 3a: Secondary | ICD-10-CM

## 2021-04-29 DIAGNOSIS — I219 Acute myocardial infarction, unspecified: Secondary | ICD-10-CM | POA: Diagnosis present

## 2021-04-29 DIAGNOSIS — E1122 Type 2 diabetes mellitus with diabetic chronic kidney disease: Secondary | ICD-10-CM | POA: Diagnosis present

## 2021-04-29 DIAGNOSIS — F17210 Nicotine dependence, cigarettes, uncomplicated: Secondary | ICD-10-CM | POA: Diagnosis present

## 2021-04-29 DIAGNOSIS — Z992 Dependence on renal dialysis: Secondary | ICD-10-CM | POA: Diagnosis not present

## 2021-04-29 DIAGNOSIS — D696 Thrombocytopenia, unspecified: Secondary | ICD-10-CM | POA: Diagnosis present

## 2021-04-29 DIAGNOSIS — J90 Pleural effusion, not elsewhere classified: Secondary | ICD-10-CM | POA: Diagnosis not present

## 2021-04-29 DIAGNOSIS — I351 Nonrheumatic aortic (valve) insufficiency: Secondary | ICD-10-CM | POA: Diagnosis not present

## 2021-04-29 DIAGNOSIS — I5023 Acute on chronic systolic (congestive) heart failure: Secondary | ICD-10-CM | POA: Diagnosis present

## 2021-04-29 DIAGNOSIS — R110 Nausea: Secondary | ICD-10-CM | POA: Diagnosis not present

## 2021-04-29 DIAGNOSIS — I5041 Acute combined systolic (congestive) and diastolic (congestive) heart failure: Secondary | ICD-10-CM | POA: Diagnosis not present

## 2021-04-29 DIAGNOSIS — Z452 Encounter for adjustment and management of vascular access device: Secondary | ICD-10-CM | POA: Diagnosis not present

## 2021-04-29 DIAGNOSIS — N1832 Chronic kidney disease, stage 3b: Secondary | ICD-10-CM | POA: Diagnosis not present

## 2021-04-29 DIAGNOSIS — Z833 Family history of diabetes mellitus: Secondary | ICD-10-CM

## 2021-04-29 DIAGNOSIS — I120 Hypertensive chronic kidney disease with stage 5 chronic kidney disease or end stage renal disease: Secondary | ICD-10-CM | POA: Diagnosis not present

## 2021-04-29 DIAGNOSIS — D649 Anemia, unspecified: Secondary | ICD-10-CM | POA: Diagnosis not present

## 2021-04-29 DIAGNOSIS — Z6822 Body mass index (BMI) 22.0-22.9, adult: Secondary | ICD-10-CM

## 2021-04-29 DIAGNOSIS — R0789 Other chest pain: Secondary | ICD-10-CM | POA: Diagnosis not present

## 2021-04-29 DIAGNOSIS — I5021 Acute systolic (congestive) heart failure: Secondary | ICD-10-CM | POA: Diagnosis not present

## 2021-04-29 DIAGNOSIS — N170 Acute kidney failure with tubular necrosis: Secondary | ICD-10-CM | POA: Diagnosis not present

## 2021-04-29 DIAGNOSIS — E785 Hyperlipidemia, unspecified: Secondary | ICD-10-CM | POA: Diagnosis present

## 2021-04-29 DIAGNOSIS — R7989 Other specified abnormal findings of blood chemistry: Secondary | ICD-10-CM | POA: Diagnosis present

## 2021-04-29 DIAGNOSIS — I252 Old myocardial infarction: Secondary | ICD-10-CM

## 2021-04-29 DIAGNOSIS — N179 Acute kidney failure, unspecified: Secondary | ICD-10-CM | POA: Diagnosis not present

## 2021-04-29 DIAGNOSIS — I509 Heart failure, unspecified: Secondary | ICD-10-CM

## 2021-04-29 DIAGNOSIS — I25110 Atherosclerotic heart disease of native coronary artery with unstable angina pectoris: Secondary | ICD-10-CM | POA: Diagnosis not present

## 2021-04-29 DIAGNOSIS — J9601 Acute respiratory failure with hypoxia: Secondary | ICD-10-CM | POA: Diagnosis present

## 2021-04-29 DIAGNOSIS — R079 Chest pain, unspecified: Secondary | ICD-10-CM | POA: Diagnosis not present

## 2021-04-29 DIAGNOSIS — I10 Essential (primary) hypertension: Secondary | ICD-10-CM | POA: Diagnosis not present

## 2021-04-29 DIAGNOSIS — E8770 Fluid overload, unspecified: Secondary | ICD-10-CM | POA: Diagnosis not present

## 2021-04-29 DIAGNOSIS — J811 Chronic pulmonary edema: Secondary | ICD-10-CM | POA: Diagnosis not present

## 2021-04-29 DIAGNOSIS — R34 Anuria and oliguria: Secondary | ICD-10-CM | POA: Diagnosis not present

## 2021-04-29 DIAGNOSIS — I130 Hypertensive heart and chronic kidney disease with heart failure and stage 1 through stage 4 chronic kidney disease, or unspecified chronic kidney disease: Secondary | ICD-10-CM | POA: Diagnosis not present

## 2021-04-29 DIAGNOSIS — I5043 Acute on chronic combined systolic (congestive) and diastolic (congestive) heart failure: Secondary | ICD-10-CM | POA: Diagnosis not present

## 2021-04-29 DIAGNOSIS — I2510 Atherosclerotic heart disease of native coronary artery without angina pectoris: Secondary | ICD-10-CM | POA: Diagnosis not present

## 2021-04-29 DIAGNOSIS — Z9109 Other allergy status, other than to drugs and biological substances: Secondary | ICD-10-CM

## 2021-04-29 DIAGNOSIS — Z4901 Encounter for fitting and adjustment of extracorporeal dialysis catheter: Secondary | ICD-10-CM | POA: Diagnosis not present

## 2021-04-29 DIAGNOSIS — N281 Cyst of kidney, acquired: Secondary | ICD-10-CM | POA: Diagnosis not present

## 2021-04-29 DIAGNOSIS — I517 Cardiomegaly: Secondary | ICD-10-CM | POA: Diagnosis not present

## 2021-04-29 DIAGNOSIS — I088 Other rheumatic multiple valve diseases: Secondary | ICD-10-CM | POA: Diagnosis not present

## 2021-04-29 DIAGNOSIS — M1990 Unspecified osteoarthritis, unspecified site: Secondary | ICD-10-CM | POA: Diagnosis present

## 2021-04-29 HISTORY — DX: Alcohol abuse, uncomplicated: F10.10

## 2021-04-29 HISTORY — DX: Tobacco use: Z72.0

## 2021-04-29 HISTORY — DX: Acute myocardial infarction, unspecified: I21.9

## 2021-04-29 HISTORY — DX: Chronic kidney disease, stage 3b: N18.32

## 2021-04-29 LAB — HEPATIC FUNCTION PANEL
ALT: 43 U/L (ref 0–44)
AST: 190 U/L — ABNORMAL HIGH (ref 15–41)
Albumin: 4 g/dL (ref 3.5–5.0)
Alkaline Phosphatase: 58 U/L (ref 38–126)
Bilirubin, Direct: 0.3 mg/dL — ABNORMAL HIGH (ref 0.0–0.2)
Indirect Bilirubin: 1.1 mg/dL — ABNORMAL HIGH (ref 0.3–0.9)
Total Bilirubin: 1.4 mg/dL — ABNORMAL HIGH (ref 0.3–1.2)
Total Protein: 7.7 g/dL (ref 6.5–8.1)

## 2021-04-29 LAB — BASIC METABOLIC PANEL
Anion gap: 16 — ABNORMAL HIGH (ref 5–15)
BUN: 20 mg/dL (ref 8–23)
CO2: 16 mmol/L — ABNORMAL LOW (ref 22–32)
Calcium: 9.4 mg/dL (ref 8.9–10.3)
Chloride: 108 mmol/L (ref 98–111)
Creatinine, Ser: 1.68 mg/dL — ABNORMAL HIGH (ref 0.61–1.24)
GFR, Estimated: 42 mL/min — ABNORMAL LOW (ref >60)
Glucose, Bld: 174 mg/dL — ABNORMAL HIGH (ref 70–99)
Potassium: 3.9 mmol/L (ref 3.5–5.1)
Sodium: 140 mmol/L (ref 135–145)

## 2021-04-29 LAB — I-STAT VENOUS BLOOD GAS, ED
Acid-base deficit: 7 mmol/L — ABNORMAL HIGH (ref 0.0–2.0)
Bicarbonate: 18.4 mmol/L — ABNORMAL LOW (ref 20.0–28.0)
Calcium, Ion: 1.2 mmol/L (ref 1.15–1.40)
HCT: 47 % (ref 39.0–52.0)
Hemoglobin: 16 g/dL (ref 13.0–17.0)
O2 Saturation: 98 %
Potassium: 3.6 mmol/L (ref 3.5–5.1)
Sodium: 140 mmol/L (ref 135–145)
TCO2: 19 mmol/L — ABNORMAL LOW (ref 22–32)
pCO2, Ven: 35.8 mmHg — ABNORMAL LOW (ref 44.0–60.0)
pH, Ven: 7.319 (ref 7.250–7.430)
pO2, Ven: 108 mmHg — ABNORMAL HIGH (ref 32.0–45.0)

## 2021-04-29 LAB — PROTIME-INR
INR: 1.1 (ref 0.8–1.2)
Prothrombin Time: 13.8 seconds (ref 11.4–15.2)

## 2021-04-29 LAB — CBC WITH DIFFERENTIAL/PLATELET
Abs Immature Granulocytes: 0.03 10*3/uL (ref 0.00–0.07)
Basophils Absolute: 0 10*3/uL (ref 0.0–0.1)
Basophils Relative: 0 %
Eosinophils Absolute: 0 10*3/uL (ref 0.0–0.5)
Eosinophils Relative: 0 %
HCT: 48 % (ref 39.0–52.0)
Hemoglobin: 16.1 g/dL (ref 13.0–17.0)
Immature Granulocytes: 0 %
Lymphocytes Relative: 12 %
Lymphs Abs: 1.3 10*3/uL (ref 0.7–4.0)
MCH: 32.7 pg (ref 26.0–34.0)
MCHC: 33.5 g/dL (ref 30.0–36.0)
MCV: 97.4 fL (ref 80.0–100.0)
Monocytes Absolute: 0.3 10*3/uL (ref 0.1–1.0)
Monocytes Relative: 3 %
Neutro Abs: 9.5 10*3/uL — ABNORMAL HIGH (ref 1.7–7.7)
Neutrophils Relative %: 85 %
Platelets: 160 10*3/uL (ref 150–400)
RBC: 4.93 MIL/uL (ref 4.22–5.81)
RDW: 13.8 % (ref 11.5–15.5)
WBC: 11.2 10*3/uL — ABNORMAL HIGH (ref 4.0–10.5)
nRBC: 0 % (ref 0.0–0.2)

## 2021-04-29 LAB — RESP PANEL BY RT-PCR (FLU A&B, COVID) ARPGX2
Influenza A by PCR: NEGATIVE
Influenza B by PCR: NEGATIVE
SARS Coronavirus 2 by RT PCR: NEGATIVE

## 2021-04-29 LAB — GLUCOSE, CAPILLARY: Glucose-Capillary: 219 mg/dL — ABNORMAL HIGH (ref 70–99)

## 2021-04-29 LAB — TROPONIN I (HIGH SENSITIVITY)
Troponin I (High Sensitivity): 20091 ng/L (ref <18)
Troponin I (High Sensitivity): 19339 ng/L (ref <18)

## 2021-04-29 LAB — MAGNESIUM: Magnesium: 1.9 mg/dL (ref 1.7–2.4)

## 2021-04-29 LAB — TSH: TSH: 0.934 u[IU]/mL (ref 0.350–4.500)

## 2021-04-29 LAB — BRAIN NATRIURETIC PEPTIDE: B Natriuretic Peptide: 1659.6 pg/mL — ABNORMAL HIGH (ref 0.0–100.0)

## 2021-04-29 LAB — MRSA NEXT GEN BY PCR, NASAL: MRSA by PCR Next Gen: NOT DETECTED

## 2021-04-29 MED ORDER — LORAZEPAM 2 MG/ML IJ SOLN: 0.0000 mg | Freq: Three times a day (TID) | INTRAMUSCULAR | Status: AC

## 2021-04-29 MED ORDER — LORAZEPAM 1 MG PO TABS: 1.0000 mg | ORAL_TABLET | ORAL | Status: DC | PRN

## 2021-04-29 MED ORDER — HEPARIN BOLUS VIA INFUSION
4000.0000 [IU] | Freq: Once | INTRAVENOUS | Status: AC
  Administered 2021-04-29: 16:00:00 4000 [IU] via INTRAVENOUS
  Filled 2021-04-29: qty 4000

## 2021-04-29 MED ORDER — ADULT MULTIVITAMIN W/MINERALS CH
1.0000 | ORAL_TABLET | Freq: Every day | ORAL | Status: DC
  Administered 2021-04-30 – 2021-05-20 (×21): 1 via ORAL
  Filled 2021-04-29 (×21): qty 1

## 2021-04-29 MED ORDER — HEPARIN (PORCINE) 25000 UT/250ML-% IV SOLN
1150.0000 [IU]/h | INTRAVENOUS | Status: DC
  Administered 2021-04-29 – 2021-05-01 (×3): 1000 [IU]/h via INTRAVENOUS
  Administered 2021-05-02 – 2021-05-08 (×7): 1150 [IU]/h via INTRAVENOUS
  Filled 2021-04-29 (×11): qty 250

## 2021-04-29 MED ORDER — THIAMINE HCL 100 MG/ML IJ SOLN
100.0000 mg | Freq: Every day | INTRAMUSCULAR | Status: DC
  Filled 2021-04-29 (×2): qty 2

## 2021-04-29 MED ORDER — MAGNESIUM SULFATE 2 GM/50ML IV SOLN
2.0000 g | INTRAVENOUS | Status: AC
  Administered 2021-04-29: 13:00:00 2 g via INTRAVENOUS
  Filled 2021-04-29: qty 50

## 2021-04-29 MED ORDER — NITROGLYCERIN IN D5W 200-5 MCG/ML-% IV SOLN
5.0000 ug/min | INTRAVENOUS | Status: DC
Start: 2021-04-29 — End: 2021-05-01
  Administered 2021-04-29: 17:00:00 5 ug/min via INTRAVENOUS
  Filled 2021-04-29: qty 250

## 2021-04-29 MED ORDER — LORAZEPAM 1 MG PO TABS
0.0000 mg | ORAL_TABLET | Freq: Three times a day (TID) | ORAL | Status: DC
Start: 2021-05-01 — End: 2021-04-29

## 2021-04-29 MED ORDER — FUROSEMIDE 10 MG/ML IJ SOLN
40.0000 mg | Freq: Two times a day (BID) | INTRAMUSCULAR | Status: DC
  Administered 2021-04-29: 20:00:00 40 mg via INTRAVENOUS
  Filled 2021-04-29: qty 4

## 2021-04-29 MED ORDER — LORAZEPAM 2 MG/ML IJ SOLN
1.0000 mg | INTRAMUSCULAR | Status: DC | PRN
Start: 2021-04-29 — End: 2021-04-29

## 2021-04-29 MED ORDER — THIAMINE HCL 100 MG PO TABS
100.0000 mg | ORAL_TABLET | Freq: Every day | ORAL | Status: DC
  Administered 2021-04-29 – 2021-05-28 (×29): 100 mg via ORAL
  Filled 2021-04-29 (×29): qty 1

## 2021-04-29 MED ORDER — ALBUTEROL SULFATE (2.5 MG/3ML) 0.083% IN NEBU
INHALATION_SOLUTION | RESPIRATORY_TRACT | Status: AC
  Administered 2021-04-29: 13:00:00 10 mg
  Filled 2021-04-29: qty 12

## 2021-04-29 MED ORDER — LORAZEPAM 1 MG PO TABS: 1.0000 mg | ORAL_TABLET | ORAL | Status: AC | PRN

## 2021-04-29 MED ORDER — AMIODARONE LOAD VIA INFUSION
150.0000 mg | Freq: Once | INTRAVENOUS | Status: AC
  Administered 2021-04-29: 16:00:00 150 mg via INTRAVENOUS
  Filled 2021-04-29: qty 83.34

## 2021-04-29 MED ORDER — SODIUM CHLORIDE 0.9% FLUSH
3.0000 mL | Freq: Two times a day (BID) | INTRAVENOUS | Status: DC
  Administered 2021-04-30 – 2021-05-15 (×20): 3 mL via INTRAVENOUS

## 2021-04-29 MED ORDER — LORAZEPAM 2 MG/ML IJ SOLN: 0.5000 mg | Freq: Once | INTRAMUSCULAR | Status: DC

## 2021-04-29 MED ORDER — FUROSEMIDE 10 MG/ML IJ SOLN
40.0000 mg | Freq: Once | INTRAMUSCULAR | Status: AC
  Administered 2021-04-29: 16:00:00 40 mg via INTRAVENOUS
  Filled 2021-04-29: qty 4

## 2021-04-29 MED ORDER — ASPIRIN EC 81 MG PO TBEC
81.0000 mg | DELAYED_RELEASE_TABLET | Freq: Every day | ORAL | Status: DC
  Administered 2021-04-30 – 2021-05-10 (×11): 81 mg via ORAL
  Filled 2021-04-29 (×11): qty 1

## 2021-04-29 MED ORDER — LORAZEPAM 1 MG PO TABS: 0.0000 mg | ORAL_TABLET | ORAL | Status: DC

## 2021-04-29 MED ORDER — THIAMINE HCL 100 MG PO TABS: 100.0000 mg | ORAL_TABLET | Freq: Every day | ORAL | Status: DC

## 2021-04-29 MED ORDER — METOPROLOL TARTRATE 5 MG/5ML IV SOLN
2.5000 mg | Freq: Four times a day (QID) | INTRAVENOUS | Status: DC
  Administered 2021-04-29: 17:00:00 2.5 mg via INTRAVENOUS
  Filled 2021-04-29 (×2): qty 5

## 2021-04-29 MED ORDER — ASPIRIN 81 MG PO CHEW
324.0000 mg | CHEWABLE_TABLET | Freq: Once | ORAL | Status: AC
  Administered 2021-04-29: 16:00:00 324 mg via ORAL
  Filled 2021-04-29: qty 4

## 2021-04-29 MED ORDER — LORAZEPAM 2 MG/ML IJ SOLN: 0.0000 mg | INTRAMUSCULAR | Status: AC

## 2021-04-29 MED ORDER — FOLIC ACID 1 MG PO TABS
1.0000 mg | ORAL_TABLET | Freq: Every day | ORAL | Status: DC
  Administered 2021-04-29 – 2021-05-28 (×29): 1 mg via ORAL
  Filled 2021-04-29 (×29): qty 1

## 2021-04-29 MED ORDER — SODIUM CHLORIDE 0.9% FLUSH: 3.0000 mL | INTRAVENOUS | Status: DC | PRN

## 2021-04-29 MED ORDER — THIAMINE HCL 100 MG/ML IJ SOLN: 100.0000 mg | Freq: Every day | INTRAMUSCULAR | Status: DC

## 2021-04-29 MED ORDER — SODIUM CHLORIDE 0.9 % IV SOLN: 250.0000 mL | INTRAVENOUS | Status: DC | PRN

## 2021-04-29 MED ORDER — ONDANSETRON HCL 4 MG/2ML IJ SOLN: 4.0000 mg | Freq: Four times a day (QID) | INTRAMUSCULAR | Status: DC | PRN

## 2021-04-29 MED ORDER — FOLIC ACID 1 MG PO TABS: 1.0000 mg | ORAL_TABLET | Freq: Every day | ORAL | Status: DC

## 2021-04-29 MED ORDER — ALBUTEROL (5 MG/ML) CONTINUOUS INHALATION SOLN
10.0000 mg/h | INHALATION_SOLUTION | RESPIRATORY_TRACT | Status: AC
  Administered 2021-04-29: 13:00:00 10 mg/h via RESPIRATORY_TRACT
  Filled 2021-04-29 (×2): qty 0.5

## 2021-04-29 MED ORDER — LORAZEPAM 2 MG/ML IJ SOLN
1.0000 mg | INTRAMUSCULAR | Status: AC | PRN
Start: 2021-04-29 — End: 2021-05-02

## 2021-04-29 MED ORDER — AMIODARONE HCL IN DEXTROSE 360-4.14 MG/200ML-% IV SOLN
60.0000 mg/h | INTRAVENOUS | Status: DC
  Administered 2021-04-29 (×2): 60 mg/h via INTRAVENOUS
  Filled 2021-04-29 (×2): qty 200

## 2021-04-29 MED ORDER — NITROGLYCERIN 0.4 MG SL SUBL: 0.4000 mg | SUBLINGUAL_TABLET | SUBLINGUAL | Status: DC | PRN

## 2021-04-29 MED ORDER — AMIODARONE HCL IN DEXTROSE 360-4.14 MG/200ML-% IV SOLN
30.0000 mg/h | INTRAVENOUS | Status: DC
  Administered 2021-04-29 – 2021-05-01 (×4): 30 mg/h via INTRAVENOUS
  Filled 2021-04-29 (×3): qty 200

## 2021-04-29 MED ORDER — ADULT MULTIVITAMIN W/MINERALS CH: 1.0000 | ORAL_TABLET | Freq: Every day | ORAL | Status: DC

## 2021-04-29 NOTE — ED Triage Notes (Signed)
Pt c/o depression x1 month coping with "drinking and smoking." Loss of appetite, interest in activities. States yesterday he started having chest pain (points to lower sternum). 5/10 dull. Denies radiating pain to arms, shoulders, neck, or jaw.

## 2021-04-29 NOTE — Progress Notes (Signed)
ANTICOAGULATION CONSULT NOTE - Initial Consult  Pharmacy Consult for Heparin Indication: chest pain/ACS  No Known Allergies  Patient Measurements: Height: 5\' 11"  (180.3 cm) Weight: 81.6 kg (180 lb) IBW/kg (Calculated) : 75.3 Heparin Dosing Weight: 81.6 kg  Vital Signs: Temp: 96.7 F (35.9 C) (12/26 1306) Temp Source: Axillary (12/26 1306) BP: 138/83 (12/26 1515) Pulse Rate: 66 (12/26 1515)  Labs: Recent Labs    04/29/21 1251 04/29/21 1405  HGB 16.1 16.0  HCT 48.0 47.0  PLT 160  --   CREATININE 1.68*  --   TROPONINIHS 20,091*  --     Estimated Creatinine Clearance: 40.5 mL/min (A) (by C-G formula based on SCr of 1.68 mg/dL (H)).   Medical History: Past Medical History:  Diagnosis Date   Arthritis    History of stress test 09/2008   showed inferolateral scar without ischemia   Hx of echocardiogram 09/2008   was essentially normal   Hyperlipidemia    Hypertension     Medications:  (Not in a hospital admission)  Scheduled:   amiodarone  150 mg Intravenous Once   aspirin  324 mg Oral Once   LORazepam  0.5 mg Intravenous Once   Infusions:   amiodarone     Followed by   amiodarone     PRN:   Assessment: 12 yom presenting in respiratory distress. Heparin per pharmacy consult placed for chest pain/ACS.  Patient is not on anticoagulation prior to arrival.  Hgb 16; plt 160  Goal of Therapy:  Heparin level 0.3-0.7 units/ml Monitor platelets by anticoagulation protocol: Yes   Plan:  Give 4000 units bolus x 1 Start heparin infusion at 1000 units/hr Check anti-Xa level in 8 hours and daily while on heparin Continue to monitor H&H and platelets  Lorelei Pont, PharmD, BCPS 04/29/2021 3:28 PM ED Clinical Pharmacist -  [phone]

## 2021-04-29 NOTE — ED Notes (Signed)
E[facility] on site loading pt into stretcher

## 2021-04-29 NOTE — ED Provider Notes (Signed)
[name]    CSN: [id number] Arrival date & time: 04/29/21  1124      History   Chief Complaint Chief Complaint  Patient presents with   Shortness of Breath    HPI Ruben [name] is a 75 y.o. male.   Patient presents with shortness of breath and chest pain that started approximately 2 days ago that has worsened since yesterday.  Chest pain is located in the central chest and does not radiate.  Pain is described as a "dull pain" and is rated 5/10 on pain scale.  Chest pain is constant.  Patient also reports a constant shortness of breath.  Patient denies any history of lung conditions or heart disease.  Pertinent medical history includes chronic kidney disease and hypertension.  Patient denies any fevers or cough.  Patient reports that the only medication that he takes is daily aspirin, although there are other blood pressure medications documented in patient's chart.  Patient does report that he smokes approximately 1 pack of cigarettes per day.   Shortness of Breath  Past Medical History:  Diagnosis Date   Arthritis    History of stress test 09/2008   showed inferolateral scar without ischemia   Hx of echocardiogram 09/2008   was essentially normal   Hyperlipidemia    Hypertension     Patient Active Problem List   Diagnosis Date Noted   Stage 3 chronic kidney disease ([place]) 07/12/2018   Prediabetes 07/12/2018   Right upper quadrant abdominal pain 08/10/2017   Right knee pain 02/15/2016   Right patella fracture 02/12/2016   HTN (hypertension) 02/21/2013   Tobacco abuse 02/21/2013   HLD (hyperlipidemia) 02/21/2013    Past Surgical History:  Procedure Laterality Date   BACK SURGERY     Dr Luiz Ochoa involving L3-L4 discectomy.   HERNIA REPAIR     SPINE SURGERY         Home Medications    Prior to Admission medications   Medication Sig Start Date End Date Taking? Authorizing Provider  albuterol (PROVENTIL) (2.5 MG/3ML) 0.083% nebulizer  solution Take 3 mLs (2.5 mg total) by nebulization every 4 (four) hours as needed for wheezing or shortness of breath. 04/03/21 04/03/22  [name], FNP  allopurinol (ZYLOPRIM) 100 MG tablet TAKE 1 TABLET(100 MG) BY MOUTH TWICE DAILY 04/15/21   [name], FNP  aspirin 81 MG tablet Take 81 mg by mouth daily.    [provider]  buPROPion (WELLBUTRIN XL) 150 MG 24 hr tablet Take 1 tablet (150 mg total) by mouth every morning. 01/23/20 01/22/21  [name], FNP  diclofenac Sodium (VOLTAREN) 1 % GEL Apply 2 g topically 4 (four) times daily. 10/24/19   [name], FNP  ibuprofen (ADVIL) 200 MG tablet Take 200 mg by mouth every 6 (six) hours as needed. Take 2 tabs one daily    [provider]  Magnesium 200 MG TABS Take 1 tablet (200 mg total) by mouth daily. With evening meal 10/22/20   [name], FNP  Meloxicam 15 MG TBDP Take 1 tablet by mouth daily. For 5 days then take once a day as needed 02/18/21   [name], FNP  methocarbamol (ROBAXIN) 500 MG tablet TAKE 1 TABLET(500 MG) BY MOUTH EVERY 8 HOURS AS NEEDED FOR MUSCLE SPASMS 10/22/20   [name], FNP  olmesartan (BENICAR) 40 MG tablet Take 1 tablet (40 mg total) by mouth daily. 02/19/21 02/19/22  [name], FNP  polyethylene glycol powder (GLYCOLAX/MIRALAX) powder Take 17 g  by mouth 2 (two) times daily as needed. 03/04/16   McVey, Gelene Mink, PA-C  simvastatin (ZOCOR) 40 MG tablet Take 1 tablet (40 mg total) by mouth every evening. 10/22/20   [name], FNP  tadalafil (CIALIS) 5 MG tablet Take 1 tablet (5 mg total) by mouth as needed for erectile dysfunction. 12/19/20   [name], FNP    Family History Family History  Problem Relation Age of Onset   Heart attack Brother    Diabetes Brother    Heart disease Brother    Cancer Father    Diabetes Brother    Heart disease Brother     Social History Social History   Tobacco Use   Smoking status: Some Days    Packs/day: 1.50    Years:  20.00    Pack years: 30.00    Types: Cigarettes   Smokeless tobacco: Never   Tobacco comments:    down to 2 packs per week; 3/21 - down to 1 Pack per week  Substance Use Topics   Alcohol use: No    Alcohol/week: 0.0 standard drinks   Drug use: No     Allergies   Patient has no known allergies.   Review of Systems Review of Systems Per HPI  Physical Exam Triage Vital Signs ED Triage Vitals [04/29/21 1134]  Enc Vitals Group     BP (!) 158/90     Pulse Rate (!) 116     Resp 18     Temp 98.7 F (37.1 C)     Temp Source Oral     SpO2 91 %     Weight      Height      Head Circumference      Peak Flow      Pain Score 0     Pain Loc      Pain Edu?      Excl. in [place]?    No data found.  Updated Vital Signs BP (!) 158/90 (BP Location: Left Arm)    Pulse (!) 116    Temp 98.7 F (37.1 C) (Oral)    Resp 18    SpO2 91%   Visual Acuity Right Eye Distance:   Left Eye Distance:   Bilateral Distance:    Right Eye Near:   Left Eye Near:    Bilateral Near:     Physical Exam Constitutional:      General: He is in acute distress.     Appearance: Normal appearance. He is ill-appearing and toxic-appearing. He is not diaphoretic.     Comments: Patient leaning forward during physical exam due to shortness of breath and chest pain.  HENT:     Head: Normocephalic and atraumatic.  Eyes:     Extraocular Movements: Extraocular movements intact.     Conjunctiva/sclera: Conjunctivae normal.  Cardiovascular:     Rate and Rhythm: Tachycardia present. Rhythm irregular.     Pulses: Normal pulses.     Heart sounds: Normal heart sounds.  Pulmonary:     Breath sounds: No stridor. No wheezing, rhonchi or rales.     Comments: Tachypnea present. Neurological:     General: No focal deficit present.     Mental Status: He is alert and oriented to person, place, and time. Mental status is at baseline.  Psychiatric:        Mood and Affect: Mood normal.        Behavior: Behavior normal.         Thought Content:  Thought content normal.        Judgment: Judgment normal.     UC Treatments / Results  Labs (all labs ordered are listed, but only abnormal results are displayed) Labs Reviewed - No data to display  EKG   Radiology No results found.  Procedures Procedures (including critical care time)  Medications Ordered in UC Medications - No data to display  Initial Impression / Assessment and Plan / UC Course  I have reviewed the triage vital signs and the nursing notes.  Pertinent labs & imaging results that were available during my care of the patient were reviewed by me and considered in my medical decision making (see chart for details).     Patient is visibly short of breath with tachypnea on exam.  Oxygen is also ranging from 88 to 91% on initial triage and physical exam.  4 L nasal cannula applied to patient.  Patient also having chest pain.  EKG showing irregularities.  Physical exam consistent with irregular heart rate.  Patient advised that he would need to go to the hospital via EMS for further evaluation and management.  Patient was agreeable with plan and left via EMS. Final Clinical Impressions(s) / UC Diagnoses   Final diagnoses:  Shortness of breath  Other chest pain     Discharge Instructions      Patient sent to the hospital via EMS.     ED Prescriptions   None    PDMP not reviewed this encounter.   Teodora Medici, [place] 04/29/21 [phone]

## 2021-04-29 NOTE — Progress Notes (Signed)
PM Follow-up: reviewed updated status with nurse. Per report patient is breathing better, still now BP, BP and HR improved. Still tachypneic by VS. 368ml UOP thus far after first dose of IV Lasix. Originally planned to give next dose 6 hours after first but reviewed update with Dr. Percival Spanish - we will give the 11pm dose of IV Lasix now and follow response. We will also plan to sign out care to on-call fellow this PM to reassess for response.

## 2021-04-29 NOTE — ED Notes (Signed)
Cardiologist at bedside.  

## 2021-04-29 NOTE — ED Triage Notes (Signed)
Pt arrived to ED via EMS w/ c/o respiratory distress. Pt c/o shob and CP x 3 days. Pt went to UC and was sent to ED. Per EMS pt was 88% RA upon arrival w/ wheezing. EMS started 18g L FA and gave 125mg  solumedrol, 10mg  albuterol, 0.5mg  atrovent. Pt arrived on neb mask. VS: 120 HR, BP 150/93, CBG 118.

## 2021-04-29 NOTE — ED Notes (Signed)
Unable to obtain covid/flu swab at this time d/t respiratory distress and Bipap

## 2021-04-29 NOTE — H&P (Addendum)
Cardiology Admission History and Physical:   Patient ID: Ruben [name] MRN: [id number]; DOB: 10-Nov-1945   Admission date: 04/29/2021  PCP:  [name], [place] Prov[facility] Cardiologist:  Remotely Dr. Vida Rigger here to update MD or APP on Care Team, Refresh:1}     Chief Complaint:  chest pain  Patient Profile:   Ruben [name] is a 75 y.o. male with HTN, HLD, CKD 3b, tobacco and alcohol abuse, abnormal stress test 2010 who is being seen 04/29/2021 for the evaluation of suspected late-presenting MI with troponin of 20k and acute heart failure.  History of Present Illness:   Mr. Rosner was previously followed by Dr. Gwenlyn Found for history of HTN, HLD and family history of CAD in his brother (MI in his 40s). He had a prior stress test in 2010 showing moderate perfusion defect in the basal and mid inferolateral region c/w infarct/scar with no evidence of ischemia, EF 52%, mildly reduced lateral wall hypocontractility. Echo showed EF low-normal 50-55% without described WMA, borderline LAE, mild MR/TR/AI. No prior cath on file. His last OV with [place] was in 2018. Denies any interim cardiac hx elsewhere. He has since been following with primary care. He has been feeling depressed the last month but does not report any significant life changes recently. He states he's been drinking too much wine - "a pint a day" - and smoking cigarettes but denies any illicit drug use. Yesterday he developed substernal chest pain associated with SOB that persisted throughout the day and night. No diaphoresis, palpitations or syncope. Due to persistent symptoms he came initially to urgent care and was found to be hypoxic 88-91% RA and visibly SOB with tachypnea. He was transported to the ED for further evaluation. He was given 125mg  solu medrol as well as albuterol and atrovent.   He was initially in NSR but in the ED went into rapid afib in the 130s. He was placed on BiPAP. Initial labs showed  troponin of 20,091, BNP 1659, AST 190 with elevated Bili, glucose 174, Cr 1.68 (within prior baseline of 1.4-1.7), mildly elevated WBC 11k, TSH wnl, Covid/flu swab pending. CXR shows interstitial prominence in the mid and lower lung zones. He's not had any recent fevers, rigors or cough but does have a tendency to stay cold. In the ED he was given another albuterol neb as well as just given 324mg  ASA, 40mg  IV Lasix, 2g IV magnesium, heparin bolus, and 150mg  amiodarone bolus followed by planned drip. He states his chest pain resolved in the waiting room without any specific intervention and remains pain free. BPs 130s-150s/70s-90s. He states he takes 4 medicines at home daily - olmesartan (thinks it's 20mg ), simvastatin, aspirin and allopurinol.  Past Medical History:  Diagnosis Date   Arthritis    Chronic kidney disease, stage 3b ([place])    ETOH abuse    History of stress test 09/02/2008   showed inferolateral scar without ischemia   Hx of echocardiogram 09/02/2008   was essentially normal   Hyperlipidemia    Hypertension    Tobacco abuse     Past Surgical History:  Procedure Laterality Date   BACK SURGERY     Dr Luiz Ochoa involving L3-L4 discectomy.   HERNIA REPAIR     SPINE SURGERY       Medications Prior to Admission: Prior to Admission medications   Medication Sig Start Date End Date Taking? Authorizing Provider  albuterol (PROVENTIL) (2.5 MG/3ML) 0.083% nebulizer solution Take 3 mLs (2.5 mg total)  by nebulization every 4 (four) hours as needed for wheezing or shortness of breath. 04/03/21 04/03/22  [name], FNP  allopurinol (ZYLOPRIM) 100 MG tablet TAKE 1 TABLET(100 MG) BY MOUTH TWICE DAILY 04/15/21   [name], FNP  aspirin 81 MG tablet Take 81 mg by mouth daily.    [provider]  buPROPion (WELLBUTRIN XL) 150 MG 24 hr tablet Take 1 tablet (150 mg total) by mouth every morning. 01/23/20 01/22/21  [name], FNP  diclofenac Sodium (VOLTAREN) 1 % GEL Apply 2 g  topically 4 (four) times daily. 10/24/19   [name], FNP  ibuprofen (ADVIL) 200 MG tablet Take 200 mg by mouth every 6 (six) hours as needed. Take 2 tabs one daily    [provider]  Magnesium 200 MG TABS Take 1 tablet (200 mg total) by mouth daily. With evening meal 10/22/20   [name], FNP  Meloxicam 15 MG TBDP Take 1 tablet by mouth daily. For 5 days then take once a day as needed 02/18/21   [name], FNP  methocarbamol (ROBAXIN) 500 MG tablet TAKE 1 TABLET(500 MG) BY MOUTH EVERY 8 HOURS AS NEEDED FOR MUSCLE SPASMS 10/22/20   [name], FNP  olmesartan (BENICAR) 40 MG tablet Take 1 tablet (40 mg total) by mouth daily. 02/19/21 02/19/22  [name], FNP  polyethylene glycol powder (GLYCOLAX/MIRALAX) powder Take 17 g by mouth 2 (two) times daily as needed. 03/04/16   McVey, Gelene Mink, PA-C  simvastatin (ZOCOR) 40 MG tablet Take 1 tablet (40 mg total) by mouth every evening. 10/22/20   [name], FNP  tadalafil (CIALIS) 5 MG tablet Take 1 tablet (5 mg total) by mouth as needed for erectile dysfunction. 12/19/20   [name], FNP     Allergies:   No Known Allergies  Social History:   Social History   Socioeconomic History   Marital status: Single    Spouse name: Not on file   Number of children: Not on file   Years of education: Not on file   Highest education level: Not on file  Occupational History   Not on file  Tobacco Use   Smoking status: Some Days    Packs/day: 1.50    Years: 20.00    Pack years: 30.00    Types: Cigarettes   Smokeless tobacco: Never   Tobacco comments:    down to 2 packs per week; 3/21 - down to 1 Pack per week  Substance and Sexual Activity   Alcohol use: No    Alcohol/week: 0.0 standard drinks   Drug use: No   Sexual activity: Not on file  Other Topics Concern   Not on file  Social History Narrative   Not on file   Social Determinants of Health   Financial Resource Strain: Not on file  Food Insecurity:  Not on file  Transportation Needs: Not on file  Physical Activity: Not on file  Stress: Not on file  Social Connections: Not on file  Intimate Partner Violence: Not on file    Family History:   The patient's family history includes Cancer in his father; Diabetes in his brother and brother; Heart attack in his brother; Heart disease in his brother and brother.    ROS:  Please see the history of present illness.  All other ROS reviewed and negative.     Physical Exam/Data:   Vitals:   04/29/21 1545 04/29/21 1551 04/29/21 1600 04/29/21 1615  BP: (!) 150/90  (!) 145/91 117/71  Pulse: (!) 134  (!) 141 (!) 103  Resp: (!) 22  (!) 25 14  Temp:  98 F (36.7 C)    TempSrc:  Oral    SpO2: 99%  98% 99%  Weight:      Height:        Intake/Output Summary (Last 24 hours) at 04/29/2021 1627 Last data filed at 04/29/2021 1459 Gross per 24 hour  Intake 296.65 ml  Output --  Net 296.65 ml   Last 3 Weights 04/29/2021 02/18/2021 10/22/2020  Weight (lbs) 180 lb 180 lb 12.8 oz 181 lb 3.2 oz  Weight (kg) 81.647 kg 82.01 kg 82.192 kg     Body mass index is 25.1 kg/m.  General: Well developed, well nourished, in no acute distress on BiPAP Head: Normocephalic, atraumatic, sclera non-icteric, no xanthomas, nares are without discharge. Neck: Negative for carotid bruits. JVP not elevated. Lungs: Clear bilaterally to auscultation without wheezes, rales, or rhonchi. Breathing is unlabored. Heart: irregularly irregular, tachycardic without obvious murmurs, rubs, or gallops.  Abdomen: Soft, non-tender, non-distended with normoactive bowel sounds. No rebound/guarding. Extremities: No clubbing or cyanosis. No edema. Distal pedal pulses are 2+ and equal bilaterally. Neuro: Alert and oriented X 3. Moves all extremities spontaneously. Psych:  Responds to questions appropriately with a normal affect. Good historian.  EKG:  The ECG that were done today were personally reviewed - multiple  tracings. Initially sinus tach 112bpm with nonspecific STT changes - no obvious STEMI although the appearance in I, avL, and V6 raises question for a silent circ distribution Subsequent tracings show atrial fib with RVR, I.e. HR 153 wit nonspecific STTW changes, again no obvious STEMI  Relevant CV Studies: See scanned reports in EMR 2010  Laboratory Data:  High Sensitivity Troponin:   Recent Labs  Lab 04/29/21 1251  TROPONINIHS 20,091*      Chemistry Recent Labs  Lab 04/29/21 1251 04/29/21 1405  NA 140 140  K 3.9 3.6  CL 108  --   CO2 16*  --   GLUCOSE 174*  --   BUN 20  --   CREATININE 1.68*  --   CALCIUM 9.4  --   MG 1.9  --   GFRNONAA 42*  --   ANIONGAP 16*  --     Recent Labs  Lab 04/29/21 1251  PROT 7.7  ALBUMIN 4.0  AST 190*  ALT 43  ALKPHOS 58  BILITOT 1.4*   Lipids No results for input(s): CHOL, TRIG, HDL, LABVLDL, LDLCALC, CHOLHDL in the last 168 hours. Hematology Recent Labs  Lab 04/29/21 1251 04/29/21 1405  WBC 11.2*  --   RBC 4.93  --   HGB 16.1 16.0  HCT 48.0 47.0  MCV 97.4  --   MCH 32.7  --   MCHC 33.5  --   RDW 13.8  --   PLT 160  --    Thyroid  Recent Labs  Lab 04/29/21 1306  TSH 0.934   BNP Recent Labs  Lab 04/29/21 1251  BNP 1,659.6*    DDimer No results for input(s): DDIMER in the last 168 hours.   Radiology/Studies:  DG Chest Port 1 View  Result Date: 04/29/2021 CLINICAL DATA:  Shortness of breath, respiratory distress, chest pain EXAM: PORTABLE CHEST 1 VIEW COMPARISON:  08/25/2008 FINDINGS: Heart is normal size. Interstitial prominence noted in the mid to lower lungs bilaterally. Small left pleural effusion. No acute bony abnormality. IMPRESSION: Interstitial prominence in the mid and lower lung zones. This could reflect infection, possibly  atypical/viral pneumonia. Less likely edema. Small left effusion. Electronically Signed   By: Rolm Baptise M.D.   On: 04/29/2021 13:21     Assessment and Plan:   1. Late  presenting myocardial infarction - symptoms started approximately 24 hours ago at 4pm yesterday, persistent since that time - troponin of 20k is consistent with late presentation - EKG does not show STEMI but nonspecific changes I, avL, V6 could represent circumflex territory - case discussed and plan formulated with MD - we will admit to ICU, continue ASA and heparin - start IV NTG - add low dose IV beta blocker 2.5mg  q6hr with hold parameters - once off bipap and able to take orals, anticipate transitioning simvastatin to higher potency atorvastatin or rosuvastatin  - f/u troponin pending to determine uptrend or downtrend - will keep NPO for now while on BiPAP and have rounding team see early for possible cath in AM if clinically ready, sooner if signs of worsening instability  2. Acute hypoxic respiratory failure suspected due to acute heart failure concerning for systolic dysfunction  - check echocardiogram - start IV Lasix 40mg  BID  - start IV NTG - follow daily weights, I/O's - hold ARB given need for cath and CKD but anticipate resumption versus titration to Entresto this admission if LV down - add low dose beta blocker as above - would be cautious about aggressive dosing given suspected heart failure  3. Rapid atrial fibrillation, new onset - agree with IV heparin and IV amiodarone, will continue - add low dose IV beta blocker as above - unclear at this time whether related to acute ischemia or a paroxysmal process, will need to factor consideration of [place] into clinical decision making after cath  4. Depression with tobacco/alcohol abuse - will need social work consultation and conversations of cessation when less acute - initiate CIWA protocol for monitoring with thiamine, folic acid, MVI  5. CKD stage 3b - Cr relatively similar to baseline 1.4-1.7, follow with trends above  6. Essential HTN - manage in context above - pharmacy reconciliation pending to clarify home olmesartan  dose  7. Hyperlipidemia - check lipid profile in AM, management as above  8. Abnormal LFTs, hyperglycemia - suspect due to AMI, will trend - check A1C  Will need formal review of home med rec in AM to ensure additional appropriate home meds reordered once known.   Risk Assessment/Risk Scores:    TIMI Risk Score for Unstable Angina or Non-ST Elevation MI:   The patient's TIMI risk score is 5, which indicates a 26% risk of all cause mortality, new or recurrent myocardial infarction or need for urgent revascularization in the next 14 days.  New York Heart Association (NYHA) Functional Class NYHA Class IV  CHA2DS2-VASc Score = 5   This indicates a 7.2% annual risk of stroke. The patient's score is based upon: CHF History: 1 HTN History: 1 Diabetes History: 0 Stroke History: 0 Vascular Disease History: 1 (suspected MI this admission) Age Score: 2 Gender Score: 0      Severity of Illness: The appropriate patient status for this patient is INPATIENT. Inpatient status is judged to be reasonable and necessary in order to provide the required intensity of service to ensure the patient's safety. The patient's presenting symptoms, physical exam findings, and initial radiographic and laboratory data in the context of their chronic comorbidities is felt to place them at high risk for further clinical deterioration. Furthermore, it is not anticipated that the patient will be medically  stable for discharge from the hospital within 2 midnights of admission.   * I certify that at the point of admission it is my clinical judgment that the patient will require inpatient hospital care spanning beyond 2 midnights from the point of admission due to high intensity of service, high risk for further deterioration and high frequency of surveillance required.*   For questions or updates, please contact [place] Please consult [email or web address] for contact info under     Signed, Charlie Pitter, PA-C   04/29/2021 4:27 PM   I have seen and examined the patient along with Melina Copa, PA.  I have reviewed the chart, notes and new data.  I agree with PA/NP's note.  Key new complaints: He is severely short of breath, tachypneic on BiPAP.  Able to communicate in short sentences.  Does not have any angina at this time.  Chest pain or shortness of breath occurred roughly 24 hours ago. Key examination changes: Rapid irregular rhythm, no murmurs or rubs are heard (at least not above the artifact from BiPAP), no moist rales, no edema, normal distal pulses with warm extremities. Key new findings / data: Troponin greater than 20,000, ECG with remarkably minor changes suggesting of possible posterior wall ischemia, reviewed nuclear scan images from 2010 that showed a very distinct and well-defined inferolateral defect consistent with an old scar.  PLAN: Delayed presentation with large non-STEMI, probably in the distribution of the left circumflex coronary artery.  Interestingly, a remote nuclear perfusion study in 2010 already showed a fairly dense scar in the same distribution. Acute MI complicated by acute pulmonary edema/heart failure (presumed that he has substantial reduction in LV systolic function at this time).  Consider possible acute mitral insufficiency related to posterior wall infarction, but I do not hear a distinct murmur on his physical exam (hard to be certain due to the noise in the emergency room especially with BiPAP).  - Treat for pulmonary edema with intravenous diuretics, intravenous nitroglycerin, add very low-dose beta-blocker to help with atrial fibrillation rate control.  Hold olmesartan tomorrow for contrast exposure. - Intravenous heparin for acute coronary syndrome and for atrial fibrillation - Use primarily intravenous amiodarone for atrial fibrillation rate/rhythm control, at least for the time being.  Note that the arrhythmia began during this hospitalization and anticoagulation  was started immediately. - If he requires angioplasty/stent, it is probably best to use clopidogrel rather than ticagrelor so that we can also treat with an oral anticoagulant for atrial fibrillation stroke prevention. - Discussed diagnostic catheterization as well as possible angioplasty/stent in detail with the patient and 2 of his children.  We spent extra time discussing the potential for contrast nephrotoxicity and the small, but real risk of significant renal injury even progressing to end-stage renal disease.  May need to have a staged revascularization procedure if a lot of contrast is used for the diagnostic study. This procedure has been fully reviewed with the patient and written informed consent has been obtained.  Sanda Klein, MD, [place] [phone] 04/29/2021, 4:38 PM

## 2021-04-29 NOTE — ED Provider Notes (Addendum)
Shared visit.  Patient with respiratory distress.  Placed on BiPAP upon arrival.  Atrial fibrillation with RVR on EKG.  He is tachypneic to the 40s.  Blood pressure however is fairly unremarkable.  No obvious fever.  Patient was seen at urgent care and sent to the emergency department.  She was given breathing treatment, IV steroids with EMS.  Appears that may be atrial fibrillation started shortly after breathing treatment with EMS.  Does not appear that patient has a significant cardiac history of heart failure.  He is a daily smoker but no documented COPD.  Has never had a blood clot.  Patient improved fairly quickly on BiPAP.  He is able to tell me now that he has had chest pain and shortness of breath the last 2 days.  Atrial fibrillation is new.  Heart rate between 120 and 140.  Chest x-ray is concerning for possible infection versus edema.  He does appear to have JVD on exam but there is no peripheral edema.  Do not appreciate obvious wheezing.  Lab work was obtained to evaluate for heart failure, ACS.  No significant anemia.  BNP is 1700.  Troponin is pending.  Blood gas shows pH is 7.3.  CO2 is 35.  Overall he is improving greatly on BiPAP.  We will pursue a CT scan to evaluate for PE given chest pain and shortness of breath.  Possible that this could be COPD or heart failure or possible ACS.  Please see PA note for further results, evaluation, disposition of the patient.  Do anticipate admission for further work-up.  3:21 PM troponin has resulted at 20,000.  Overall suspect ACS as a cause of shortness of breath and chest pain.  Likely having heart failure symptoms now.  Patient likely had worsening symptoms after albuterol as increased demand on heart.  Hemodynamically he is appearing stable.  Will give aspirin.  We will start the patient on IV heparin and cardiology has been consulted.  Given his atrial fibrillation and concern for NSTEMI we will start him on IV amiodarone.  This chart was dictated  using voice recognition software.  Despite best efforts to proofread,  errors can occur which can change the documentation meaning.    Lennice Sites, DO 04/29/21 [place], [place], DO 04/29/21 [place], Kjell Brannen, DO 04/29/21 1528

## 2021-04-29 NOTE — Progress Notes (Incomplete)
Transported pt. Via bipap from ED to [facility].

## 2021-04-29 NOTE — ED Provider Notes (Signed)
[facility] EMERGENCY DEPARTMENT Provider Note   CSN: [id number] Arrival date & time: 04/29/21  1241     History Chief Complaint  Patient presents with   Respiratory Distress    Ruben [name] is a 75 y.o. male with a past medical history of stage III kidney disease, hypertension, hyperlipidemia, chronic tobacco abuse.  He is transported from urgent care,.  Level 5 caveat due to respiratory distress 3 is gathered at bedside by EMS, and review of EMR.  According to the previous provider note at [facility] sleep urgent care the patient presented with dull 5 out of 10 chest pain which she has had present for the past 2 days that significantly worsened.  He has been feeling more short of breath for the past couple days.  Cording to review of EMR at the urgent care he appeared ill and short of breath.  He is also very tachypneic hypoxic to 88% on room air.  Patient arrives in respiratory distress, extremely tachypneic.  Initially tachycardic to the 120s and then up to almost 200.  Patient appears air hungry.  I immediately ordered BiPAP.  He is diaphoretic.  HPI     Past Medical History:  Diagnosis Date   Arthritis    Chronic kidney disease, stage 3b ([place])    ETOH abuse    History of stress test 09/02/2008   showed inferolateral scar without ischemia   Hx of echocardiogram 09/02/2008   was essentially normal   Hyperlipidemia    Hypertension    Tobacco abuse     Patient Active Problem List   Diagnosis Date Noted   Acute myocardial infarction ([place]) 04/29/2021   Stage 3 chronic kidney disease ([place]) 07/12/2018   Prediabetes 07/12/2018   Right upper quadrant abdominal pain 08/10/2017   Right knee pain 02/15/2016   Right patella fracture 02/12/2016   HTN (hypertension) 02/21/2013   Tobacco abuse 02/21/2013   HLD (hyperlipidemia) 02/21/2013    Past Surgical History:  Procedure Laterality Date   BACK SURGERY     Dr Luiz Ochoa involving L3-L4 discectomy.    HERNIA REPAIR     SPINE SURGERY         Family History  Problem Relation Age of Onset   Heart attack Brother    Diabetes Brother    Heart disease Brother    Cancer Father    Diabetes Brother    Heart disease Brother     Social History   Tobacco Use   Smoking status: Some Days    Packs/day: 1.50    Years: 20.00    Pack years: 30.00    Types: Cigarettes   Smokeless tobacco: Never   Tobacco comments:    down to 2 packs per week; 3/21 - down to 1 Pack per week  Substance Use Topics   Alcohol use: No    Alcohol/week: 0.0 standard drinks   Drug use: No    Home Medications Prior to Admission medications   Medication Sig Start Date End Date Taking? Authorizing Provider  acetaminophen (TYLENOL) 650 MG CR tablet Take 650 mg by mouth every 8 (eight) hours as needed for pain.   Yes [provider]  allopurinol (ZYLOPRIM) 100 MG tablet TAKE 1 TABLET(100 MG) BY MOUTH TWICE DAILY Patient taking differently: Take 100 mg by mouth 2 (two) times daily. 04/15/21  Yes [name], FNP  aspirin 81 MG tablet Take 81 mg by mouth daily.   Yes [provider]  buPROPion (WELLBUTRIN XL) 150  MG 24 hr tablet Take 1 tablet (150 mg total) by mouth every morning. 01/23/20 04/29/21 Yes [name], FNP  cholecalciferol (VITAMIN D3) 25 MCG (1000 UNIT) tablet Take 1,000 Units by mouth daily.   Yes [provider]  diclofenac Sodium (VOLTAREN) 1 % GEL Apply 2 g topically 4 (four) times daily. Patient taking differently: Apply 2 g topically daily as needed (pain). 10/24/19  Yes [name], FNP  linaCLOtide (LINZESS PO) Take 1 tablet by mouth daily. Not sure about mg; got samples from doctor   Yes [provider]  Magnesium 200 MG TABS Take 1 tablet (200 mg total) by mouth daily. With evening meal Patient taking differently: Take 200 mg by mouth daily. 10/22/20  Yes [name], FNP  methocarbamol (ROBAXIN) 500 MG tablet TAKE 1 TABLET(500 MG) BY MOUTH EVERY 8 HOURS  AS NEEDED FOR MUSCLE SPASMS Patient taking differently: Take 500 mg by mouth every 8 (eight) hours as needed for muscle spasms. 10/22/20  Yes [name], FNP  olmesartan (BENICAR) 40 MG tablet Take 1 tablet (40 mg total) by mouth daily. 02/19/21 02/19/22 Yes [name], FNP  simvastatin (ZOCOR) 40 MG tablet Take 1 tablet (40 mg total) by mouth every evening. 10/22/20  Yes [name], FNP  tadalafil (CIALIS) 5 MG tablet Take 1 tablet (5 mg total) by mouth as needed for erectile dysfunction. Patient taking differently: Take 5 mg by mouth daily as needed for erectile dysfunction. 12/19/20  Yes [name], FNP  albuterol (PROVENTIL) (2.5 MG/3ML) 0.083% nebulizer solution Take 3 mLs (2.5 mg total) by nebulization every 4 (four) hours as needed for wheezing or shortness of breath. Patient not taking: Reported on 04/29/2021 04/03/21 04/03/22  [name], FNP  Meloxicam 15 MG TBDP Take 1 tablet by mouth daily. For 5 days then take once a day as needed Patient not taking: Reported on 04/29/2021 02/18/21   [name], FNP  polyethylene glycol powder (GLYCOLAX/MIRALAX) powder Take 17 g by mouth 2 (two) times daily as needed. Patient not taking: Reported on 04/29/2021 03/04/16   McVey, Gelene Mink, PA-C    Allergies    Other  Review of Systems   Review of Systems  Unable to perform ROS: Acuity of condition   Physical Exam Updated Vital Signs BP 115/85    Pulse 99    Temp 98 F (36.7 C) (Oral)    Resp (!) 33    Ht 5\' 11"  (1.803 m)    Wt 81.6 kg    SpO2 100%    BMI 25.10 kg/m   Physical Exam Vitals and nursing note reviewed.  Constitutional:      General: He is not in acute distress.    Appearance: He is well-developed. He is ill-appearing and diaphoretic.  HENT:     Head: Normocephalic and atraumatic.  Eyes:     General: No scleral icterus.    Conjunctiva/sclera: Conjunctivae normal.  Cardiovascular:     Rate and Rhythm: Normal rate and regular rhythm.     Heart sounds:  Normal heart sounds.  Pulmonary:     Effort: Tachypnea, accessory muscle usage, prolonged expiration and respiratory distress present.     Breath sounds: Decreased air movement present. Decreased breath sounds, wheezing and rhonchi present.  Abdominal:     Palpations: Abdomen is soft.     Tenderness: There is no abdominal tenderness.  Musculoskeletal:     Cervical back: Normal range of motion and neck supple.     Right lower leg: No edema.  Left lower leg: No edema.  Skin:    General: Skin is warm.  Neurological:     Mental Status: He is alert.  Psychiatric:        Behavior: Behavior normal.    ED Results / Procedures / Treatments   Labs (all labs ordered are listed, but only abnormal results are displayed) Labs Reviewed  BASIC METABOLIC PANEL - Abnormal; Notable for the following components:      Result Value   CO2 16 (*)    Glucose, Bld 174 (*)    Creatinine, Ser 1.68 (*)    GFR, Estimated 42 (*)    Anion gap 16 (*)    All other components within normal limits  CBC WITH DIFFERENTIAL/PLATELET - Abnormal; Notable for the following components:   WBC 11.2 (*)    Neutro Abs 9.5 (*)    All other components within normal limits  BRAIN NATRIURETIC PEPTIDE - Abnormal; Notable for the following components:   B Natriuretic Peptide 1,659.6 (*)    All other components within normal limits  HEPATIC FUNCTION PANEL - Abnormal; Notable for the following components:   AST 190 (*)    Total Bilirubin 1.4 (*)    Bilirubin, Direct 0.3 (*)    Indirect Bilirubin 1.1 (*)    All other components within normal limits  I-STAT VENOUS BLOOD GAS, ED - Abnormal; Notable for the following components:   pCO2, Ven 35.8 (*)    pO2, Ven 108.0 (*)    Bicarbonate 18.4 (*)    TCO2 19 (*)    Acid-base deficit 7.0 (*)    All other components within normal limits  TROPONIN I (HIGH SENSITIVITY) - Abnormal; Notable for the following components:   Troponin I (High Sensitivity) 20,091 (*)    All other  components within normal limits  TROPONIN I (HIGH SENSITIVITY) - Abnormal; Notable for the following components:   Troponin I (High Sensitivity) 19,339 (*)    All other components within normal limits  RESP PANEL BY RT-PCR (FLU A&B, COVID) ARPGX2  TSH  MAGNESIUM  PROTIME-INR  HEPARIN LEVEL (UNFRACTIONATED)  HEPARIN LEVEL (UNFRACTIONATED)  COMPREHENSIVE METABOLIC PANEL  CBC  LIPID PANEL  HEMOGLOBIN A1C    EKG EKG Interpretation  Date/Time:  Monday April 29 2021 13:10:13 EST Ventricular Rate:  153 PR Interval:    QRS Duration: 82 QT Interval:  283 QTC Calculation: 452 R Axis:   67 Text Interpretation: Atrial fibrillation with rapid V-rate Ventricular premature complex Repolarization abnormality, prob rate related Confirmed by Lennice Sites ([id number]) on 04/29/2021 2:54:29 PM  Initial EKG by EMS on arrival     Radiology DG Chest Port 1 View  Result Date: 04/29/2021 CLINICAL DATA:  Shortness of breath, respiratory distress, chest pain EXAM: PORTABLE CHEST 1 VIEW COMPARISON:  08/25/2008 FINDINGS: Heart is normal size. Interstitial prominence noted in the mid to lower lungs bilaterally. Small left pleural effusion. No acute bony abnormality. IMPRESSION: Interstitial prominence in the mid and lower lung zones. This could reflect infection, possibly atypical/viral pneumonia. Less likely edema. Small left effusion. Electronically Signed   By: Rolm Baptise M.D.   On: 04/29/2021 13:21    Procedures .Critical Care Performed by: Margarita Mail, PA-C Authorized by: Margarita Mail, PA-C   Critical care provider statement:    Critical care time (minutes):  75   Critical care was necessary to treat or prevent imminent or life-threatening deterioration of the following conditions:  Cardiac failure and respiratory failure   Critical care was time spent personally  by me on the following activities:  Development of treatment plan with patient or surrogate, discussions with consultants,  evaluation of patient's response to treatment, examination of patient, ordering and review of laboratory studies, ordering and review of radiographic studies, ordering and performing treatments and interventions, pulse oximetry, re-evaluation of patient's condition and review of old charts   Medications Ordered in ED Medications  albuterol (PROVENTIL,VENTOLIN) solution continuous neb (0 mg/hr Nebulization Stopped 04/29/21 1551)  amiodarone (NEXTERONE) 1.8 mg/mL load via infusion 150 mg (150 mg Intravenous Bolus from Bag 04/29/21 1556)    Followed by  amiodarone (NEXTERONE PREMIX) 360-4.14 MG/200ML-% (1.8 mg/mL) IV infusion (60 mg/hr Intravenous Infusion Verify 04/29/21 1714)    Followed by  amiodarone (NEXTERONE PREMIX) 360-4.14 MG/200ML-% (1.8 mg/mL) IV infusion (has no administration in time range)  heparin ADULT infusion 100 units/mL (25000 units/234mL) (1,000 Units/hr Intravenous Infusion Verify 04/29/21 1714)  metoprolol tartrate (LOPRESSOR) injection 2.5 mg (2.5 mg Intravenous Given 04/29/21 1705)  nitroGLYCERIN 50 mg in dextrose 5 % 250 mL (0.2 mg/mL) infusion (5 mcg/min Intravenous New Bag/Given 04/29/21 1711)  LORazepam (ATIVAN) tablet 1-4 mg (has no administration in time range)    Or  LORazepam (ATIVAN) injection 1-4 mg (has no administration in time range)  thiamine tablet 100 mg (100 mg Oral Given 04/29/21 1703)    Or  thiamine (B-1) injection 100 mg ( Intravenous See Alternative 45/36/46 8032)  folic acid (FOLVITE) tablet 1 mg (1 mg Oral Given 04/29/21 1703)  multivitamin with minerals tablet 1 tablet (has no administration in time range)  LORazepam (ATIVAN) injection 0-4 mg (0 mg Intravenous Not Given 04/29/21 1643)    Followed by  LORazepam (ATIVAN) injection 0-4 mg (has no administration in time range)  aspirin EC tablet 81 mg (has no administration in time range)  nitroGLYCERIN (NITROSTAT) SL tablet 0.4 mg (has no administration in time range)  sodium chloride flush (NS)  0.9 % injection 3 mL (has no administration in time range)  sodium chloride flush (NS) 0.9 % injection 3 mL (has no administration in time range)  0.9 %  sodium chloride infusion (has no administration in time range)  furosemide (LASIX) injection 40 mg (has no administration in time range)  magnesium sulfate IVPB 2 g 50 mL (0 g Intravenous Stopped 04/29/21 1406)  albuterol (PROVENTIL) (2.5 MG/3ML) 0.083% nebulizer solution (10 mg  Given 04/29/21 1314)  aspirin chewable tablet 324 mg (324 mg Oral Given 04/29/21 1538)  heparin bolus via infusion 4,000 Units (4,000 Units Intravenous Bolus from Bag 04/29/21 1559)  furosemide (LASIX) injection 40 mg (40 mg Intravenous Given 04/29/21 1552)    ED Course  I have reviewed the triage vital signs and the nursing notes.  Pertinent labs & imaging results that were available during my care of the patient were reviewed by me and considered in my medical decision making (see chart for details).    MDM Rules/Calculators/A&P 75 year old male who arrives in extremis.  Initial EKG appears to show A. fib with RVR. The emergent differential diagnosis for shortness of breath includes, but is not limited to, Pulmonary edema, bronchoconstriction, Pneumonia, Pulmonary embolism, Pneumotherax/ Hemothorax, Dysrythmia, ACS.  Initially EKG run by EMS showed sinus tachycardia at a rate of 120 with some T wave inversions laterally.  He arried tachypneic in the 40s  wheezing with rapid deompensation and in respiratory extremis with air hunger. He was using accessory muscles. Patient is a somker and was wheezing significantly.  Placed on BiPAP, is receiving a 10 mill gram  albuterol over 1 hour.  I personally reviewed the patient's chest x-ray which appears to show hyperinflation with edema versus infiltrates.  On physical examination the patient does not appear volume overloaded.  He has no peripheral edema but does have JVD.  Patient more comfortable on bipap and able to  speak in short sentences.  Labs show cbc with leukocytosis  BMP with baseline renal insufficiency and anion gap of 16 BNP markedly elevated, Troponin markedly elevated at 20,091  Cxr shows infiltrate vs. Edema  Given hx suspect pulmonary edema in the setting of suspected recent infarct, -afib w/rvr and respiratory distress.  Patient started on amiodarone, hepatin, and lasix.   Patient seen in sared visit with Dr. Ronnald Nian  Dr. Donivan Scull and NP Idolina Primer consulted emergently on patient and will admit to the cardiology service.      Final Clinical Impression(s) / ED Diagnoses Final diagnoses:  NSTEMI (non-ST elevated myocardial infarction) [facility])  Atrial fibrillation with RVR [facility])    Rx / DC Orders ED Discharge Orders     None        Margarita Mail, PA-C 05/01/21 1009    Lennice Sites, DO 05/06/21 0820

## 2021-04-29 NOTE — ED Notes (Signed)
Notified [facility]

## 2021-04-29 NOTE — ED Notes (Signed)
EMS contacted for transport to ED. Pt on 4LNC spo2 95%

## 2021-04-29 NOTE — Discharge Instructions (Signed)
Patient sent to the hospital via EMS. 

## 2021-04-29 NOTE — ED Notes (Signed)
EKG obtained d/t slower rate and ability to see rhythm more clearly. EKG shown to cardiologist.

## 2021-04-30 ENCOUNTER — Inpatient Hospital Stay (HOSPITAL_COMMUNITY): Payer: Medicare Other

## 2021-04-30 DIAGNOSIS — N182 Chronic kidney disease, stage 2 (mild): Secondary | ICD-10-CM

## 2021-04-30 DIAGNOSIS — N170 Acute kidney failure with tubular necrosis: Secondary | ICD-10-CM

## 2021-04-30 DIAGNOSIS — I340 Nonrheumatic mitral (valve) insufficiency: Secondary | ICD-10-CM | POA: Diagnosis not present

## 2021-04-30 DIAGNOSIS — I214 Non-ST elevation (NSTEMI) myocardial infarction: Secondary | ICD-10-CM

## 2021-04-30 DIAGNOSIS — J9601 Acute respiratory failure with hypoxia: Secondary | ICD-10-CM

## 2021-04-30 DIAGNOSIS — R079 Chest pain, unspecified: Secondary | ICD-10-CM

## 2021-04-30 DIAGNOSIS — I4891 Unspecified atrial fibrillation: Secondary | ICD-10-CM | POA: Diagnosis not present

## 2021-04-30 LAB — COMPREHENSIVE METABOLIC PANEL
ALT: 44 U/L (ref 0–44)
AST: 155 U/L — ABNORMAL HIGH (ref 15–41)
Albumin: 3.7 g/dL (ref 3.5–5.0)
Alkaline Phosphatase: 44 U/L (ref 38–126)
Anion gap: 14 (ref 5–15)
BUN: 29 mg/dL — ABNORMAL HIGH (ref 8–23)
CO2: 15 mmol/L — ABNORMAL LOW (ref 22–32)
Calcium: 9.2 mg/dL (ref 8.9–10.3)
Chloride: 109 mmol/L (ref 98–111)
Creatinine, Ser: 2.24 mg/dL — ABNORMAL HIGH (ref 0.61–1.24)
GFR, Estimated: 30 mL/min — ABNORMAL LOW (ref >60)
Glucose, Bld: 172 mg/dL — ABNORMAL HIGH (ref 70–99)
Potassium: 4.2 mmol/L (ref 3.5–5.1)
Sodium: 138 mmol/L (ref 135–145)
Total Bilirubin: 0.6 mg/dL (ref 0.3–1.2)
Total Protein: 6.8 g/dL (ref 6.5–8.1)

## 2021-04-30 LAB — HEPARIN LEVEL (UNFRACTIONATED)
Heparin Unfractionated: 0.51 IU/mL (ref 0.30–0.70)
Heparin Unfractionated: 0.56 IU/mL (ref 0.30–0.70)

## 2021-04-30 LAB — CBC
HCT: 42.7 % (ref 39.0–52.0)
Hemoglobin: 14.9 g/dL (ref 13.0–17.0)
MCH: 33 pg (ref 26.0–34.0)
MCHC: 34.9 g/dL (ref 30.0–36.0)
MCV: 94.7 fL (ref 80.0–100.0)
Platelets: 139 10*3/uL — ABNORMAL LOW (ref 150–400)
RBC: 4.51 MIL/uL (ref 4.22–5.81)
RDW: 14 % (ref 11.5–15.5)
WBC: 8.8 10*3/uL (ref 4.0–10.5)
nRBC: 0 % (ref 0.0–0.2)

## 2021-04-30 LAB — LIPID PANEL
Cholesterol: 160 mg/dL (ref 0–200)
HDL: 59 mg/dL (ref >40)
LDL Cholesterol: 93 mg/dL (ref 0–99)
Total CHOL/HDL Ratio: 2.7 RATIO
Triglycerides: 41 mg/dL (ref <150)
VLDL: 8 mg/dL (ref 0–40)

## 2021-04-30 LAB — HEMOGLOBIN A1C
Hgb A1c MFr Bld: 5.7 % — ABNORMAL HIGH (ref 4.8–5.6)
Mean Plasma Glucose: 116.89 mg/dL

## 2021-04-30 LAB — ECHOCARDIOGRAM COMPLETE
Height: 71 in
S' Lateral: 3.4 cm
Weight: 2740.76 oz

## 2021-04-30 MED ORDER — ORAL CARE MOUTH RINSE
15.0000 mL | Freq: Two times a day (BID) | OROMUCOSAL | Status: DC
  Administered 2021-04-30 – 2021-05-27 (×42): 15 mL via OROMUCOSAL

## 2021-04-30 MED ORDER — CHLORHEXIDINE GLUCONATE CLOTH 2 % EX PADS
6.0000 | MEDICATED_PAD | Freq: Every day | CUTANEOUS | Status: DC
  Administered 2021-04-30 – 2021-05-17 (×15): 6 via TOPICAL

## 2021-04-30 NOTE — Progress Notes (Signed)
Pt placed on BiPAP to rest this evening. Sat 98 and resting comfortably. RT will cont to monitor.

## 2021-04-30 NOTE — Progress Notes (Addendum)
Progress Note  Patient Name: Ruben [name] Date of Encounter: 04/30/2021  Primary Cardiologist: Sanda Klein, MD  Subjective   Denies any chest pain. Breathing much better. Remains on BiPAP. Per nurse had 150cc UOP overnight. Cr rising. Reports feeling hungry.  Did well off of BiPAP temporarily in the morning to take pills.  Inpatient Medications    Scheduled Meds:  aspirin EC  81 mg Oral Daily   Chlorhexidine Gluconate Cloth  6 each Topical Daily   folic acid  1 mg Oral Daily   LORazepam  0-4 mg Intravenous Q4H   Followed by   Derrill Memo ON 05/01/2021] LORazepam  0-4 mg Intravenous Q8H   metoprolol tartrate  2.5 mg Intravenous Q6H   multivitamin with minerals  1 tablet Oral Daily   sodium chloride flush  3 mL Intravenous Q12H   thiamine  100 mg Oral Daily   Or   thiamine  100 mg Intravenous Daily   Continuous Infusions:  sodium chloride     amiodarone 30 mg/hr (04/30/21 0414)   heparin 1,000 Units/hr (04/30/21 0400)   nitroGLYCERIN 5 mcg/min (04/30/21 0400)   PRN Meds: sodium chloride, LORazepam **OR** LORazepam, nitroGLYCERIN, sodium chloride flush   Vital Signs    Vitals:   04/30/21 0600 04/30/21 0630 04/30/21 0700 04/30/21 0740  BP: 115/78 107/80 96/69   Pulse: (!) 104 97 (!) 110 (!) 110  Resp: (!) 30 (!) 36 (!) 28 (!) 35  Temp:    97.8 F (36.6 C)  TempSrc:    Axillary  SpO2: 99% 98% 98% 98%  Weight:      Height:        Intake/Output Summary (Last 24 hours) at 04/30/2021 0800 Last data filed at 04/30/2021 0600 Gross per 24 hour  Intake 851.43 ml  Output 150 ml  Net 701.43 ml   Last 3 Weights 04/30/2021 04/29/2021 04/29/2021  Weight (lbs) 171 lb 4.8 oz 170 lb 13.7 oz 180 lb  Weight (kg) 77.7 kg 77.5 kg 81.647 kg     Telemetry    Atrial fib/flutter currently high 90s - Personally Reviewed  ECG    atrial fib 88bpm, nonspecific STTW changes with particular attention to I, avL, V5-V6 - Personally Reviewed  Physical Exam   GEN: No  acute distress.  HEENT: Normocephalic, atraumatic, sclera non-icteric. Neck: No bruits. Elevated JVD. Cardiac: irregularly irregular, rate 90s, no murmurs, rubs, or gallops.  Respiratory: Clear to auscultation bilaterally. Breathing is unlabored. GI: Soft, nontender, non-distended, BS +x 4. MS: no deformity. Extremities: No clubbing or cyanosis. No edema. Distal pedal pulses are 2+ and equal bilaterally. Neuro:  AAOx3. Follows commands. Psych:  Responds to questions appropriately with a normal affect.  Labs    High Sensitivity Troponin:   Recent Labs  Lab 04/29/21 1251 04/29/21 1530  TROPONINIHS 20,091* 19,339*      Cardiac EnzymesNo results for input(s): TROPONINI in the last 168 hours. No results for input(s): TROPIPOC in the last 168 hours.   Chemistry Recent Labs  Lab 04/29/21 1251 04/29/21 1405 04/30/21 0227  NA 140 140 138  K 3.9 3.6 4.2  CL 108  --  109  CO2 16*  --  15*  GLUCOSE 174*  --  172*  BUN 20  --  29*  CREATININE 1.68*  --  2.24*  CALCIUM 9.4  --  9.2  PROT 7.7  --  6.8  ALBUMIN 4.0  --  3.7  AST 190*  --  155*  ALT 43  --  44  ALKPHOS 58  --  44  BILITOT 1.4*  --  0.6  GFRNONAA 42*  --  30*  ANIONGAP 16*  --  14     Hematology Recent Labs  Lab 04/29/21 1251 04/29/21 1405 04/30/21 0227  WBC 11.2*  --  8.8  RBC 4.93  --  4.51  HGB 16.1 16.0 14.9  HCT 48.0 47.0 42.7  MCV 97.4  --  94.7  MCH 32.7  --  33.0  MCHC 33.5  --  34.9  RDW 13.8  --  14.0  PLT 160  --  139*    BNP Recent Labs  Lab 04/29/21 1251  BNP 1,659.6*     DDimer No results for input(s): DDIMER in the last 168 hours.   Radiology    DG Chest Port 1 View  Result Date: 04/29/2021 CLINICAL DATA:  Shortness of breath, respiratory distress, chest pain EXAM: PORTABLE CHEST 1 VIEW COMPARISON:  08/25/2008 FINDINGS: Heart is normal size. Interstitial prominence noted in the mid to lower lungs bilaterally. Small left pleural effusion. No acute bony abnormality. IMPRESSION:  Interstitial prominence in the mid and lower lung zones. This could reflect infection, possibly atypical/viral pneumonia. Less likely edema. Small left effusion. Electronically Signed   By: Rolm Baptise M.D.   On: 04/29/2021 13:21    Cardiac Studies   Pending  Patient Profile     75 y.o. male with HTN, HLD, CKD 3b, tobacco and alcohol abuse, abnormal stress test 2010 who was admitted 04/29/21 with suspected late-presenting MI with troponin of 20k and acute heart failure. In the ED he went into rapid atrial fibrillation (new onset).  Assessment & Plan    1. Late presenting myocardial infarction - symptoms started 24 hours prior to hospital admission - troponin of 20k was consistent with late presentation - EKG does not show STEMI but nonspecific changes I, avL, V6 could represent circumflex territory - remains CP free - continue ASA - continue heparin per pharmacy - continue low dose IV NTG and IV BB with hold parameters - will discuss further dispo for cath with MD, although rising creatinine is likely prohibitive --> we will hold off on cardiac catheterization today, need to reassess renal function in the morning.  If stable or improved, would plan cardiac catheterization tomorrow.  Will make n.p.o. after midnight.   - once off BiPAP and able to take orals, anticipate starting atorvastatin in lieu of home simvastatin    2. Acute hypoxic respiratory failure suspected due to acute heart failure concerning for systolic dysfunction, also mitral regurgitation - called echo team to expedite echo - addendum: 2D echo shows EF 35-40%, inferior HK, normal RV, splay artifact likely ischemic MR with moderate-severe MR and mild dilation of ascending aorta -> murmur not pronounced on exam this AM but may be compromised by ambient BiPAP sound, result relayed to MD --> Concerned that much regurgitation could partly be responsible for his respiratory distress.  Likely ischemic MR as very anticipating  potential LCx lesion. - hold off further Lasix given rise in Cr - will review with MD - lungs are much more clear and no edema in legs, although does have JVD - avoid ACEI/ARB/spiro/ARNI due to AKI - will discuss with MD plans for BiPAP/downtitration of O2 therapy -> we will see how he does today off of BiPAP on nasal cannula.  Used BiPAP to rest overnight.   3. Rapid atrial fibrillation, new onset (sinus on admission) - continue IV heparin, IV amiodarone,  IV BB as tolerated - remains in afib at this time but rates 90s-low 100s -> relatively asymptomatic at this point. - unclear at this time whether related to acute ischemia or a paroxysmal process, will need to factor consideration of [place] into clinical decision making after cath   4. AKI on CKD stage 3b - initial Cr relatively similar to baseline 1.4-1.7, but rising to 2.24 today; most likely related to hypoperfusion from MI with reduced EF and MR. - hold further Lasix - avoid nephrotoxic agents - follow strict I/O's - still making urine although not robust - consider nephrology consult if this continues to rise  5. Depression with tobacco/alcohol abuse - will need social work consultation and conversations of cessation when less acute - continue CIWA protocol for monitoring with thiamine, folic acid, MVI   6. Essential HTN with softer BP overnight - manage in context above - pharmacy reconciliation pending to clarify home olmesartan dose -> weaned off IV NTG.  We will reassess blood pressures once stable, but will hold off on any antihypertensives for now unless becomes hypertensive to allow for renal perfusion.   7. Hyperlipidemia - LDL 93 -> anticipate transition from simvastatin to atorvastatin when off BiPAP  8. Abnormal LFTs, hyperglycemia - suspect due to AMI, continue to trend - A1C c/w pre-DM but not full diabetes  For questions or updates, please contact [place] Please consult [email or web address] for contact info under  Cardiology/STEMI.  Signed, Charlie Pitter, PA-C 04/30/2021, 8:00 AM     ATTENDING ATTESTATION  I have seen, examined and evaluated the patient this AM along with Melina Copa, PA.  After reviewing all the available data and chart, we discussed the patients laboratory, study & physical findings as well as symptoms in detail. I agree with her findings, examination as well as impression recommendations as per our discussion.    Attending adjustments noted in italics.   Overall seems to be relatively stable.  Holding off on cardiac catheterization today because of renal function worsening.  Hopefully as he stabilizes, renal function will improve.  I suspect this has something to do with his reduced ejection fraction and much regurgitation.  If renal function does worsen, would likely need to consider potentially adding inotropic agents.  Remains in A. fib, rates borderline controlled on amiodarone.  Blood pressure not necessarily adequate to use beta-blockers.  Hold parameters in place.  Remains in ICU for close monitoring-requiring BiPAP at night.    Ruben [name], M.D., M.S. Interventional Cardiologist   Pager # [phone] Phone # [phone] [address] [place], [place] [address]

## 2021-04-30 NOTE — Progress Notes (Signed)
This chaplain met the Pt. family in the [facility] waiting area. The chaplain offered a spiritual care presence and prayer to the family and later briefly with the Pt.    Chaplain Sallyanne Kuster [phone]

## 2021-04-30 NOTE — Progress Notes (Signed)
°  Echocardiogram 2D Echocardiogram has been performed.  Ruben [name] 04/30/2021, 8:59 AM

## 2021-04-30 NOTE — Progress Notes (Signed)
ANTICOAGULATION CONSULT NOTE - Follow Up Consult  Pharmacy Consult for heparin Indication:  MI/Afib  Labs: Recent Labs    04/29/21 1251 04/29/21 1405 04/29/21 1530 04/29/21 1531 04/30/21 0227  HGB 16.1 16.0  --   --  14.9  HCT 48.0 47.0  --   --  42.7  PLT 160  --   --   --  139*  LABPROT  --   --   --  13.8  --   INR  --   --   --  1.1  --   HEPARINUNFRC  --   --   --   --  0.51  CREATININE 1.68*  --   --   --   --   TROPONINIHS 20,091*  --  19,339*  --   --     Assessment/Plan:  75yo male therapeutic on heparin with initial dosing for MI and Afib. Will continue infusion at current rate of 1000 units/hr and confirm stable with additional level.   Wynona Neat, PharmD, BCPS  04/30/2021,3:56 AM

## 2021-04-30 NOTE — Progress Notes (Signed)
ANTICOAGULATION CONSULT NOTE  Pharmacy Consult for Heparin Indication: chest pain/ACS  Allergies  Allergen Reactions   Other Other (See Comments)    Pollen - unknown    Patient Measurements: Height: 5\' 11"  (180.3 cm) Weight: 77.7 kg (171 lb 4.8 oz) IBW/kg (Calculated) : 75.3 Heparin Dosing Weight: 81.6 kg  Vital Signs: Temp: 97.8 F (36.6 C) (12/27 0740) Temp Source: Axillary (12/27 0740) BP: 104/79 (12/27 1230) Pulse Rate: 102 (12/27 1230)  Labs: Recent Labs    04/29/21 1251 04/29/21 1405 04/29/21 1530 04/29/21 1531 04/30/21 0227 04/30/21 1031  HGB 16.1 16.0  --   --  14.9  --   HCT 48.0 47.0  --   --  42.7  --   PLT 160  --   --   --  139*  --   LABPROT  --   --   --  13.8  --   --   INR  --   --   --  1.1  --   --   HEPARINUNFRC  --   --   --   --  0.51 0.56  CREATININE 1.68*  --   --   --  2.24*  --   TROPONINIHS 20,091*  --  19,339*  --   --   --      Estimated Creatinine Clearance: 30.3 mL/min (A) (by C-G formula based on SCr of 2.24 mg/dL (H)).   Medical History: Past Medical History:  Diagnosis Date   Arthritis    Chronic kidney disease, stage 3b ([place])    ETOH abuse    History of stress test 09/02/2008   showed inferolateral scar without ischemia   Hx of echocardiogram 09/02/2008   was essentially normal   Hyperlipidemia    Hypertension    Tobacco abuse     Medications:   Infusions:   sodium chloride     amiodarone 30 mg/hr (04/30/21 1200)   heparin 1,000 Units/hr (04/30/21 1200)   nitroGLYCERIN Stopped (04/30/21 1106)   PRN:   Assessment: 66 yom presenting in respiratory distress. Heparin per pharmacy consult placed for chest pain/ACS.  Awaiting possible cath once renal function improving.    Heparin level within goal range this morning.  No overt bleeding or complications noted.  CBC fairly stable.  Goal of Therapy:  Heparin level 0.3-0.7 units/ml Monitor platelets by anticoagulation protocol: Yes   Plan:  Continue IV  heparin at 1000 units/hr. Daily heparin level and CBC. F/u plans for cath lab eventually.  [place] Crane, Roylene Reason, BCCP Clinical Pharmacist  04/30/2021 1:05 PM   [facility] pharmacy phone numbers are listed on [email or web address]

## 2021-04-30 NOTE — Care Management (Signed)
°  Transition of Care [facility]) Screening Note   Patient Details  Name: Ruben [name] Date of Birth: [birth year]   Transition of Care [facility]) CM/SW Contact:    Carles Collet, RN Phone Number: 04/30/2021, 4:01 PM    Transition of Care Department [facility]) has reviewed patient and no TOC needs have been identified at this time. We will continue to monitor patient advancement through interdisciplinary progression rounds. If new patient transition needs arise, please place a TOC consult.

## 2021-05-01 ENCOUNTER — Inpatient Hospital Stay (HOSPITAL_COMMUNITY): Payer: Medicare Other

## 2021-05-01 ENCOUNTER — Encounter (HOSPITAL_COMMUNITY)
Admission: EM | Disposition: A | Discharge status: Home / Self Care | Payer: Self-pay | Source: Ambulatory Visit | Attending: Cardiovascular Disease

## 2021-05-01 ENCOUNTER — Inpatient Hospital Stay: Payer: Self-pay

## 2021-05-01 DIAGNOSIS — I255 Ischemic cardiomyopathy: Secondary | ICD-10-CM

## 2021-05-01 DIAGNOSIS — I5041 Acute combined systolic (congestive) and diastolic (congestive) heart failure: Secondary | ICD-10-CM

## 2021-05-01 DIAGNOSIS — N171 Acute kidney failure with acute cortical necrosis: Secondary | ICD-10-CM

## 2021-05-01 DIAGNOSIS — I4891 Unspecified atrial fibrillation: Secondary | ICD-10-CM | POA: Diagnosis not present

## 2021-05-01 DIAGNOSIS — N1831 Chronic kidney disease, stage 3a: Secondary | ICD-10-CM

## 2021-05-01 DIAGNOSIS — D72829 Elevated white blood cell count, unspecified: Secondary | ICD-10-CM | POA: Diagnosis not present

## 2021-05-01 DIAGNOSIS — I214 Non-ST elevation (NSTEMI) myocardial infarction: Secondary | ICD-10-CM | POA: Diagnosis not present

## 2021-05-01 LAB — URINALYSIS, ROUTINE W REFLEX MICROSCOPIC
Bilirubin Urine: NEGATIVE
Glucose, UA: NEGATIVE mg/dL
Hgb urine dipstick: NEGATIVE
Ketones, ur: NEGATIVE mg/dL
Leukocytes,Ua: NEGATIVE
Nitrite: NEGATIVE
Protein, ur: NEGATIVE mg/dL
Specific Gravity, Urine: 1.024 (ref 1.005–1.030)
pH: 5 (ref 5.0–8.0)

## 2021-05-01 LAB — CBC
HCT: 39.9 % (ref 39.0–52.0)
Hemoglobin: 13.5 g/dL (ref 13.0–17.0)
MCH: 32.1 pg (ref 26.0–34.0)
MCHC: 33.8 g/dL (ref 30.0–36.0)
MCV: 94.8 fL (ref 80.0–100.0)
Platelets: 122 10*3/uL — ABNORMAL LOW (ref 150–400)
RBC: 4.21 MIL/uL — ABNORMAL LOW (ref 4.22–5.81)
RDW: 14.2 % (ref 11.5–15.5)
WBC: 14.8 10*3/uL — ABNORMAL HIGH (ref 4.0–10.5)
nRBC: 0 % (ref 0.0–0.2)

## 2021-05-01 LAB — LACTIC ACID, PLASMA: Lactic Acid, Venous: 1.5 mmol/L (ref 0.5–1.9)

## 2021-05-01 LAB — COOXEMETRY PANEL
Carboxyhemoglobin: 0.7 % (ref 0.5–1.5)
Carboxyhemoglobin: 0.6 % (ref 0.5–1.5)
Carboxyhemoglobin: 0.7 % (ref 0.5–1.5)
Methemoglobin: 0.6 % (ref 0.0–1.5)
Methemoglobin: 0.7 % (ref 0.0–1.5)
Methemoglobin: 0.8 % (ref 0.0–1.5)
O2 Saturation: 89.1 %
O2 Saturation: 46.9 %
O2 Saturation: 67.2 %
Total hemoglobin: 14.2 g/dL (ref 12.0–16.0)
Total hemoglobin: 13.4 g/dL (ref 12.0–16.0)
Total hemoglobin: 14.3 g/dL (ref 12.0–16.0)

## 2021-05-01 LAB — HEPARIN LEVEL (UNFRACTIONATED): Heparin Unfractionated: 0.46 IU/mL (ref 0.30–0.70)

## 2021-05-01 LAB — BASIC METABOLIC PANEL
Anion gap: 13 (ref 5–15)
BUN: 51 mg/dL — ABNORMAL HIGH (ref 8–23)
CO2: 19 mmol/L — ABNORMAL LOW (ref 22–32)
Calcium: 9 mg/dL (ref 8.9–10.3)
Chloride: 103 mmol/L (ref 98–111)
Creatinine, Ser: 2.45 mg/dL — ABNORMAL HIGH (ref 0.61–1.24)
GFR, Estimated: 27 mL/min — ABNORMAL LOW (ref >60)
Glucose, Bld: 112 mg/dL — ABNORMAL HIGH (ref 70–99)
Potassium: 4.2 mmol/L (ref 3.5–5.1)
Sodium: 135 mmol/L (ref 135–145)

## 2021-05-01 LAB — HEPATIC FUNCTION PANEL
ALT: 56 U/L — ABNORMAL HIGH (ref 0–44)
AST: 102 U/L — ABNORMAL HIGH (ref 15–41)
Albumin: 3.6 g/dL (ref 3.5–5.0)
Alkaline Phosphatase: 43 U/L (ref 38–126)
Bilirubin, Direct: 0.2 mg/dL (ref 0.0–0.2)
Indirect Bilirubin: 0.5 mg/dL (ref 0.3–0.9)
Total Bilirubin: 0.7 mg/dL (ref 0.3–1.2)
Total Protein: 6.2 g/dL — ABNORMAL LOW (ref 6.5–8.1)

## 2021-05-01 SURGERY — RIGHT/LEFT HEART CATH AND CORONARY ANGIOGRAPHY
Anesthesia: LOCAL

## 2021-05-01 MED ORDER — MILRINONE LACTATE IN DEXTROSE 20-5 MG/100ML-% IV SOLN
0.2500 ug/kg/min | INTRAVENOUS | Status: DC
  Administered 2021-05-01 – 2021-05-08 (×10): 0.25 ug/kg/min via INTRAVENOUS
  Filled 2021-05-01 (×11): qty 100

## 2021-05-01 MED ORDER — SODIUM CHLORIDE 0.9% FLUSH
10.0000 mL | INTRAVENOUS | Status: DC | PRN
  Administered 2021-05-07: 12:00:00 10 mL

## 2021-05-01 MED ORDER — AMIODARONE HCL 200 MG PO TABS
400.0000 mg | ORAL_TABLET | Freq: Two times a day (BID) | ORAL | Status: DC
  Administered 2021-05-01 – 2021-05-07 (×14): 400 mg via ORAL
  Filled 2021-05-01 (×14): qty 2

## 2021-05-01 MED ORDER — ATORVASTATIN CALCIUM 80 MG PO TABS
80.0000 mg | ORAL_TABLET | Freq: Every day | ORAL | Status: DC
  Administered 2021-05-01 – 2021-05-28 (×27): 80 mg via ORAL
  Filled 2021-05-01 (×27): qty 1

## 2021-05-01 MED ORDER — SODIUM CHLORIDE 0.9% FLUSH
10.0000 mL | Freq: Two times a day (BID) | INTRAVENOUS | Status: DC
  Administered 2021-05-01: 22:00:00 10 mL
  Administered 2021-05-01: 12:00:00 20 mL
  Administered 2021-05-02 – 2021-05-20 (×25): 10 mL
  Administered 2021-05-20: 20 mL
  Administered 2021-05-21 – 2021-05-27 (×7): 10 mL

## 2021-05-01 MED ORDER — NOREPINEPHRINE 4 MG/250ML-% IV SOLN
2.0000 ug/min | INTRAVENOUS | Status: DC
Start: 2021-05-01 — End: 2021-05-06
  Administered 2021-05-01: 18:00:00 2 ug/min via INTRAVENOUS
  Administered 2021-05-02 (×2): 5 ug/min via INTRAVENOUS
  Administered 2021-05-04: 2 ug/min via INTRAVENOUS
  Filled 2021-05-01 (×4): qty 250

## 2021-05-01 NOTE — Progress Notes (Addendum)
Updated Co-ox reviewed with MD (first specimen ran prior to picc) - 46.9, c/w low output state. Will start milrinone IV @ 0.25 mcg/kg/min. Lactate, CXR reviewed. No other infective signs or cough. Will follow for response to milrinone.

## 2021-05-01 NOTE — Progress Notes (Signed)
Peripherally Inserted Central Catheter Placement  The IV Nurse has discussed with the patient and/or persons authorized to consent for the patient, the purpose of this procedure and the potential benefits and risks involved with this procedure.  The benefits include less needle sticks, lab draws from the catheter, and the patient may be discharged home with the catheter. Risks include, but not limited to, infection, bleeding, blood clot (thrombus formation), and puncture of an artery; nerve damage and irregular heartbeat and possibility to perform a PICC exchange if needed/ordered by physician.  Alternatives to this procedure were also discussed.  Bard Power PICC patient education guide, fact sheet on infection prevention and patient information card has been provided to patient /or left at bedside.    PICC Placement Documentation  PICC Double Lumen 05/01/21 PICC Right Brachial 41 cm 1 cm (Active)  Indication for Insertion or Continuance of Line Vasoactive infusions 05/01/21 1140  Exposed Catheter (cm) 1 cm 05/01/21 1140  Site Assessment Clean;Dry;Intact 05/01/21 1140  Lumen #1 Status Flushed;Saline locked;Blood return noted 05/01/21 1140  Lumen #2 Status Flushed;Saline locked;Blood return noted 05/01/21 1140  Dressing Type Transparent 05/01/21 1140  Dressing Status Clean;Dry;Intact 05/01/21 1140  Antimicrobial disc in place? Yes 05/01/21 1140  Dressing Intervention New dressing;Other (Comment) 05/01/21 1140  Dressing Change Due 05/08/21 05/01/21 1140       Christella Noa Albarece 05/01/2021, 11:41 AM

## 2021-05-01 NOTE — Progress Notes (Signed)
ANTICOAGULATION CONSULT NOTE  Pharmacy Consult for Heparin Indication: chest pain/ACS  Allergies  Allergen Reactions   Other Other (See Comments)    Pollen - unknown    Patient Measurements: Height: 5\' 11"  (180.3 cm) Weight: 79.8 kg (175 lb 14.8 oz) IBW/kg (Calculated) : 75.3 Heparin Dosing Weight: 81.6 kg  Vital Signs: Temp: 98.5 F (36.9 C) (12/28 0721) Temp Source: Oral (12/28 0721) BP: 90/69 (12/28 0700) Pulse Rate: 86 (12/28 0700)  Labs: Recent Labs    04/29/21 1251 04/29/21 1405 04/29/21 1530 04/29/21 1531 04/30/21 0227 04/30/21 1031 05/01/21 0116  HGB 16.1 16.0  --   --  14.9  --  13.5  HCT 48.0 47.0  --   --  42.7  --  39.9  PLT 160  --   --   --  139*  --  122*  LABPROT  --   --   --  13.8  --   --   --   INR  --   --   --  1.1  --   --   --   HEPARINUNFRC  --   --   --   --  0.51 0.56 0.46  CREATININE 1.68*  --   --   --  2.24*  --  2.45*  TROPONINIHS 20,091*  --  19,339*  --   --   --   --      Estimated Creatinine Clearance: 27.7 mL/min (A) (by C-G formula based on SCr of 2.45 mg/dL (H)).   Medical History: Past Medical History:  Diagnosis Date   Arthritis    Chronic kidney disease, stage 3b ([place])    ETOH abuse    History of stress test 09/02/2008   showed inferolateral scar without ischemia   Hx of echocardiogram 09/02/2008   was essentially normal   Hyperlipidemia    Hypertension    Tobacco abuse     Medications:   Infusions:   sodium chloride     amiodarone 30 mg/hr (05/01/21 0700)   heparin 1,000 Units/hr (05/01/21 0700)   PRN:   Assessment: 37 yom presenting in respiratory distress. Heparin per pharmacy consult placed for chest pain/ACS.  Awaiting possible cath once renal function improving.    Heparin level remains within goal range this morning.  No overt bleeding or complications noted.  CBC fairly stable.  Goal of Therapy:  Heparin level 0.3-0.7 units/ml Monitor platelets by anticoagulation protocol: Yes   Plan:   Continue IV heparin at 1000 units/hr. Daily heparin level and CBC. F/u plans for cath lab eventually.  [place] Crane, Roylene Reason, BCCP Clinical Pharmacist  05/01/2021 8:27 AM   [facility] pharmacy phone numbers are listed on [email or web address]

## 2021-05-01 NOTE — Progress Notes (Signed)
Pt placed on BiPAP to rest this evening. RT will cont to monitor.

## 2021-05-01 NOTE — Progress Notes (Addendum)
Progress Note  Patient Name: Marcha Dutton Date of Encounter: 05/01/2021  Primary Cardiologist: Sanda Klein, MD  Subjective   He states he continues to feel better daily, less SOB (no dyspnea at rest), no chest pain, no awareness of AF. HR better in the 80s-90s but BP remains soft (systolic 96P-59F). UOP better, but not robust.  Inpatient Medications    Scheduled Meds:  aspirin EC  81 mg Oral Daily   Chlorhexidine Gluconate Cloth  6 each Topical Daily   folic acid  1 mg Oral Daily   LORazepam  0-4 mg Intravenous Q4H   Followed by   LORazepam  0-4 mg Intravenous Q8H   mouth rinse  15 mL Mouth Rinse BID   multivitamin with minerals  1 tablet Oral Daily   sodium chloride flush  3 mL Intravenous Q12H   thiamine  100 mg Oral Daily   Or   thiamine  100 mg Intravenous Daily   Continuous Infusions:  sodium chloride     amiodarone 30 mg/hr (05/01/21 0700)   heparin 1,000 Units/hr (05/01/21 0700)   PRN Meds: sodium chloride, LORazepam **OR** LORazepam, nitroGLYCERIN, sodium chloride flush   Vital Signs    Vitals:   05/01/21 0500 05/01/21 0600 05/01/21 0700 05/01/21 0721  BP: (!) 84/71 (!) 87/65 90/69   Pulse: 96 91 86   Resp: 20 (!) 21 (!) 23   Temp:    98.5 F (36.9 C)  TempSrc:    Oral  SpO2: 98% 94% 98% 97%  Weight: 79.8 kg     Height:        Intake/Output Summary (Last 24 hours) at 05/01/2021 0818 Last data filed at 05/01/2021 0700 Gross per 24 hour  Intake 1337.74 ml  Output 625 ml  Net 712.74 ml   Last 3 Weights 05/01/2021 04/30/2021 04/29/2021  Weight (lbs) 175 lb 14.8 oz 171 lb 4.8 oz 170 lb 13.7 oz  Weight (kg) 79.8 kg 77.7 kg 77.5 kg     Telemetry    Atrial fib 80s-90s - Personally Reviewed  Physical Exam   GEN: No acute distress.  HEENT: Normocephalic, atraumatic, sclera non-icteric. Neck: No JVD or bruits. Cardiac: Irregularly irregular, rate controlled, 2/4 apical HSM, no rubs or gallops.  Respiratory: Mild end expiratory  wheezing to auscultation bilaterally, no rales or rhonchi. Breathing is unlabored. GI: Soft, nontender, non-distended, BS +x 4. MS: no deformity. Extremities: No clubbing or cyanosis. No edema. Distal pedal pulses are 2+ and equal bilaterally. Neuro:  AAOx3. Follows commands. Psych:  Responds to questions appropriately with a normal affect.  Labs    High Sensitivity Troponin:   Recent Labs  Lab 04/29/21 1251 04/29/21 1530  TROPONINIHS 20,091* 19,339*      Cardiac EnzymesNo results for input(s): TROPONINI in the last 168 hours. No results for input(s): TROPIPOC in the last 168 hours.   Chemistry Recent Labs  Lab 04/29/21 1251 04/29/21 1405 04/30/21 0227 05/01/21 0116  NA 140 140 138 135  K 3.9 3.6 4.2 4.2  CL 108  --  109 103  CO2 16*  --  15* 19*  GLUCOSE 174*  --  172* 112*  BUN 20  --  29* 51*  CREATININE 1.68*  --  2.24* 2.45*  CALCIUM 9.4  --  9.2 9.0  PROT 7.7  --  6.8 6.2*  ALBUMIN 4.0  --  3.7 3.6  AST 190*  --  155* 102*  ALT 43  --  44 56*  ALKPHOS 58  --  44 43  BILITOT 1.4*  --  0.6 0.7  GFRNONAA 42*  --  30* 27*  ANIONGAP 16*  --  14 13     Hematology Recent Labs  Lab 04/29/21 1251 04/29/21 1405 04/30/21 0227 05/01/21 0116  WBC 11.2*  --  8.8 14.8*  RBC 4.93  --  4.51 4.21*  HGB 16.1 16.0 14.9 13.5  HCT 48.0 47.0 42.7 39.9  MCV 97.4  --  94.7 94.8  MCH 32.7  --  33.0 32.1  MCHC 33.5  --  34.9 33.8  RDW 13.8  --  14.0 14.2  PLT 160  --  139* 122*    BNP Recent Labs  Lab 04/29/21 1251  BNP 1,659.6*     DDimer No results for input(s): DDIMER in the last 168 hours.   Radiology    DG Chest Port 1 View  Result Date: 04/29/2021 CLINICAL DATA:  Shortness of breath, respiratory distress, chest pain EXAM: PORTABLE CHEST 1 VIEW COMPARISON:  08/25/2008 FINDINGS: Heart is normal size. Interstitial prominence noted in the mid to lower lungs bilaterally. Small left pleural effusion. No acute bony abnormality. IMPRESSION: Interstitial prominence  in the mid and lower lung zones. This could reflect infection, possibly atypical/viral pneumonia. Less likely edema. Small left effusion. Electronically Signed   By: Rolm Baptise M.D.   On: 04/29/2021 13:21   ECHOCARDIOGRAM COMPLETE  Result Date: 04/30/2021    ECHOCARDIOGRAM REPORT   Patient Name:   TRYSTIAN CRISANTO [facility] Date of Exam: 04/30/2021 Medical Rec #:  [id number]             Height:       71.0 in Accession #:    [id number]            Weight:       171.3 lb Date of Birth:  26-Nov-1945             BSA:          1.974 m Patient Age:    75 years              BP:           96/69 mmHg Patient Gender: M                     HR:           110 bpm. Exam Location:  Inpatient Procedure: 2D Echo Indications:    acute myocardial infarction  History:        Patient has prior history of Echocardiogram examinations, most                 recent 09/25/2008. Chronic kidney disease, Arrythmias:Atrial                 Fibrillation; Risk Factors:Hypertension and Dyslipidemia.  Sonographer:    Johny Chess RDCS Referring Phys: 80 [place]  1. Inferior wal hypokinesis . Left ventricular ejection fraction, by estimation, is 35 to 40%. The left ventricle has moderately decreased function. The left ventricle has no regional wall motion abnormalities. Left ventricular diastolic parameters are indeterminate.  2. Right ventricular systolic function is normal. The right ventricular size is normal.  3. Splay artifact likely ischemic MR given inferior wall motion abnormality . The mitral valve is abnormal. Moderate to severe mitral valve regurgitation. No evidence of mitral stenosis.  4. The aortic valve is tricuspid. There is mild calcification of the aortic valve. There is mild thickening of the  aortic valve. Aortic valve regurgitation is mild. Aortic valve sclerosis/calcification is present, without any evidence of aortic stenosis.  5. Aortic dilatation noted. There is mild dilatation of the ascending aorta,  measuring 38 mm.  6. The inferior vena cava is normal in size with greater than 50% respiratory variability, suggesting right atrial pressure of 3 mmHg. FINDINGS  Left Ventricle: Inferior wal hypokinesis. Left ventricular ejection fraction, by estimation, is 35 to 40%. The left ventricle has moderately decreased function. The left ventricle has no regional wall motion abnormalities. The left ventricular internal cavity size was normal in size. There is no left ventricular hypertrophy. Left ventricular diastolic parameters are indeterminate. Right Ventricle: The right ventricular size is normal. No increase in right ventricular wall thickness. Right ventricular systolic function is normal. Left Atrium: Left atrial size was normal in size. Right Atrium: Right atrial size was normal in size. Pericardium: There is no evidence of pericardial effusion. Mitral Valve: Splay artifact likely ischemic MR given inferior wall motion abnormality. The mitral valve is abnormal. There is mild thickening of the mitral valve leaflet(s). There is mild calcification of the mitral valve leaflet(s). Moderate to severe mitral valve regurgitation. No evidence of mitral valve stenosis. Tricuspid Valve: The tricuspid valve is normal in structure. Tricuspid valve regurgitation is not demonstrated. No evidence of tricuspid stenosis. Aortic Valve: The aortic valve is tricuspid. There is mild calcification of the aortic valve. There is mild thickening of the aortic valve. Aortic valve regurgitation is mild. Aortic valve sclerosis/calcification is present, without any evidence of aortic stenosis. Pulmonic Valve: The pulmonic valve was normal in structure. Pulmonic valve regurgitation is mild. No evidence of pulmonic stenosis. Aorta: The aortic root is normal in size and structure and aortic dilatation noted. There is mild dilatation of the ascending aorta, measuring 38 mm. Venous: The inferior vena cava is normal in size with greater than 50%  respiratory variability, suggesting right atrial pressure of 3 mmHg. IAS/Shunts: No atrial level shunt detected by color flow Doppler.  LEFT VENTRICLE PLAX 2D LVIDd:         4.30 cm LVIDs:         3.40 cm LV PW:         1.10 cm LV IVS:        1.00 cm LVOT diam:     1.80 cm LV SV:         27 LV SV Index:   14 LVOT Area:     2.54 cm  RIGHT VENTRICLE         IVC TAPSE (M-mode): 1.1 cm  IVC diam: 2.20 cm LEFT ATRIUM             Index        RIGHT ATRIUM           Index LA diam:        3.00 cm 1.52 cm/m   RA Area:     19.80 cm LA Vol (A2C):   86.8 ml 43.97 ml/m  RA Volume:   59.70 ml  30.24 ml/m LA Vol (A4C):   48.4 ml 24.52 ml/m LA Biplane Vol: 68.5 ml 34.70 ml/m  AORTIC VALVE LVOT Vmax:   69.80 cm/s LVOT Vmean:  44.100 cm/s LVOT VTI:    0.108 m  AORTA Ao Root diam: 3.40 cm Ao Asc diam:  3.80 cm  SHUNTS Systemic VTI:  0.11 m Systemic Diam: 1.80 cm Jenkins Rouge MD Electronically signed by Jenkins Rouge MD Signature Date/Time: 04/30/2021/9:57:29 AM  Final    [place] EKG SITE RITE  Result Date: 05/01/2021 If Site Rite image not attached, placement could not be confirmed due to current cardiac rhythm.   Cardiac Studies   2D echo 04/30/21  1. Inferior wal hypokinesis . Left ventricular ejection fraction, by  estimation, is 35 to 40%. The left ventricle has moderately decreased  function. The left ventricle has no regional wall motion abnormalities.  Left ventricular diastolic parameters are  indeterminate.   2. Right ventricular systolic function is normal. The right ventricular  size is normal.   3. Splay artifact likely ischemic MR given inferior wall motion  abnormality . The mitral valve is abnormal. Moderate to severe mitral  valve regurgitation. No evidence of mitral stenosis.   4. The aortic valve is tricuspid. There is mild calcification of the  aortic valve. There is mild thickening of the aortic valve. Aortic valve  regurgitation is mild. Aortic valve sclerosis/calcification is present,   without any evidence of aortic  stenosis.   5. Aortic dilatation noted. There is mild dilatation of the ascending  aorta, measuring 38 mm.   6. The inferior vena cava is normal in size with greater than 50%  respiratory variability, suggesting right atrial pressure of 3 mmHg.    Patient Profile     75 y.o. male with HTN, HLD, CKD 3b, tobacco and alcohol abuse, abnormal stress test 2010 who was admitted 04/29/21 with suspected late-presenting MI with troponin of 20k and acute heart failure. In the ED he went into rapid atrial fibrillation (new onset).  Assessment & Plan    1. Late presenting myocardial infarction - symptoms started 24 hours prior to hospital admission - troponin of 20k was consistent with late presentation - EKG does not show STEMI but nonspecific changes I, avL, V6 could represent circumflex territory - remains CP free - continue ASA - continue heparin per pharmacy - DC IV NTG, IV BB given perpetually soft BP (has not been getting these regardless) - given rise in Cr, continue to hold on cath -> per MD, OK to eat - add atorvastatin 80mg  daily   2. Acute hypoxic respiratory failure suspected due to acute systolic heart failure complicated by suspected ischemic mitral regurgitation - 2D echo shows EF 35-40%, inferior HK, normal RV, splay artifact likely ischemic MR with moderate-severe MR and mild dilation of ascending aorta -> soft murmur on exam - hold off further Lasix given rise in Cr - per d/w Dr. Ellyn Hack, plan to place PICC and order Co-ox to trend and if low will need inotropes (would likely consider milrinone as first option) - tolerating [place] O2   3. Rapid atrial fibrillation, new onset (sinus on admission) - continue IV heparin, IV amiodarone -> we will convert from IV amiodarone to oral today.  This will help avoid excess volume. - remains in afib at this time but rates 80s-90s (relatively asymptomatic) - unclear at this time whether related to acute ischemia  or a paroxysmal process, will need to factor consideration of [place] into clinical decision making after cath   4. AKI on CKD stage 3b - initial Cr relatively similar to baseline 1.4-1.7, but has risen to 2.24-> 2.48 - hold further Lasix and avoid nephrotoxic agents -> pending decision re: inotropes - follow strict I/O's - still making urine although not robust - consider nephrology consult if this continues to rise -> suspect this may very well be related to initial low output from MI with some ATN.  Continue to  monitor.  Could potentially improve if inotropic support is added.   5. Depression with tobacco/alcohol abuse - will need social work consultation and conversations of cessation when less acute - continue CIWA protocol for monitoring with thiamine, folic acid, MVI   6. Essential HTN with hypotension suspected possibly borderline cardiogenic shock - hold antihypertensives   7. Hyperlipidemia - LDL 93 -> start atorvastatin in lieu of home simvastatin, follow LFTs   8. Abnormal LFTs, hyperglycemia - suspect due to AMI, continue to trend - A1C c/w pre-DM but not full diabetes  9. Leukocytosis, thrombocytopenia - rising WBC to 14k, plt slightly decreasing to 122 (plt mildly decreased previously as well), suspect reactive due to cardiac issues but will send blood cx, UA, portable CXR, lactate and continue to trend  For questions or updates, please contact [place] Please consult [email or web address] for contact info under Cardiology/STEMI.  Signed, Charlie Pitter, PA-C 05/01/2021, 8:18 AM     ATTENDING ATTESTATION  I have seen, examined and evaluated the patient this AM on rounds along with Melina Copa, PA-c.  After reviewing all the available data and chart, we discussed the patients laboratory, study & physical findings as well as symptoms in detail. I agree with her findings, examination as well as impression recommendations as per our discussion.    Attending adjustments noted in  italics.   Clinically looks better, but his blood pressures are lower, borderline shocky with worsening renal function but is somewhat plateaued.  On hold for now.  Discussed with advanced heart failure team with reduced EF and MR, could this potentially be related to low output.  Will place PICC line and check coags levels.  Anticipate potentially starting milrinone. Converting from IV to oral amiodarone, but unable to add any GDMT related medications for heart failure based on low blood pressures. No longer on BiPAP, no edema but he does have JVD.  I suspect the MR is probably ischemic and are from potentially occluded RCA or LCx based on troponin levels and prolonged chest pain.  Unfortunately was late presenting and now is well beyond the 48-hour window for potential revascularization of totally occluded. -> Invasive options are standby pending renal stabilization   CRITICAL CARE  The patient is critically ill with multiple organ systems failure and requires high complexity decision making for assessment and support, frequent evaluation and titration of therapies, application of advanced monitoring technologies and extensive interpretation of multiple databases.    Combined PA and MD critical Care Time devoted to patient care services (including consultation with advanced heart failure specialist) described in this note is 70 minutes.   Performed [id number] [place] care time was exclusive of separately billable procedures and treating other patients.  Critical care was necessary to treat or prevent imminent or life-threatening deterioration.  Critical care was time spent personally by me on the following activities: development of treatment plan with patient and/or surrogate as well as nursing, discussions with consultants, evaluation of patient's response to treatment, examination of patient, obtaining history from patient or surrogate, ordering and performing treatments and  interventions, ordering and review of laboratory studies, ordering and review of radiographic studies, pulse oximetry and re-evaluation of patient's condition.  This time reflects time of care of this signee @mec @. This critical care time does not reflect procedure time, or teaching time or supervisory time of PA/NP/Med student/Med Resident etc but could involve care discussion time with the patient, family & RN staff.       Shanon Brow  Ellyn Hack, M.D., M.S. Interventional Cardiologist   Pager # [phone] Phone # [phone] [address] [place], [place] [address]

## 2021-05-01 NOTE — Progress Notes (Signed)
° °  Reevaluated this evening after initiation of milrinone.  Blood pressures have been mostly in the 80s to low 90s, but occasionally in the 70s.  She is feeling okay.  No significant dyspnea.  Urine output only 200 mL since starting the milrinone.  Discussed with Dr. Aundra Dubin from advanced heart failure service, will add low-dose norepinephrine between 2 to 5 mcg titrating for SBP's greater than 95.  We will also measure CVP levels in order to determine if diuretic is necessary by tomorrow.  With now component of advanced heart failure with borderline cardiogenic shock, have asked the advanced heart failure service to take over in the morning.   Glenetta Hew, MD

## 2021-05-01 NOTE — Plan of Care (Signed)

## 2021-05-02 ENCOUNTER — Other Ambulatory Visit (HOSPITAL_COMMUNITY): Payer: Self-pay

## 2021-05-02 DIAGNOSIS — I5021 Acute systolic (congestive) heart failure: Secondary | ICD-10-CM | POA: Diagnosis not present

## 2021-05-02 LAB — COOXEMETRY PANEL
Carboxyhemoglobin: 0.8 % (ref 0.5–1.5)
Carboxyhemoglobin: 0.7 % (ref 0.5–1.5)
Methemoglobin: 1 % (ref 0.0–1.5)
Methemoglobin: 0.9 % (ref 0.0–1.5)
O2 Saturation: 74.5 %
O2 Saturation: 57.1 %
Total hemoglobin: 11.9 g/dL — ABNORMAL LOW (ref 12.0–16.0)
Total hemoglobin: 13 g/dL (ref 12.0–16.0)

## 2021-05-02 LAB — HEPATIC FUNCTION PANEL
ALT: 58 U/L — ABNORMAL HIGH (ref 0–44)
AST: 48 U/L — ABNORMAL HIGH (ref 15–41)
Albumin: 3.2 g/dL — ABNORMAL LOW (ref 3.5–5.0)
Alkaline Phosphatase: 41 U/L (ref 38–126)
Bilirubin, Direct: 0.2 mg/dL (ref 0.0–0.2)
Indirect Bilirubin: 0.9 mg/dL (ref 0.3–0.9)
Total Bilirubin: 1.1 mg/dL (ref 0.3–1.2)
Total Protein: 6.2 g/dL — ABNORMAL LOW (ref 6.5–8.1)

## 2021-05-02 LAB — HEPARIN LEVEL (UNFRACTIONATED)
Heparin Unfractionated: 0.25 IU/mL — ABNORMAL LOW (ref 0.30–0.70)
Heparin Unfractionated: 0.3 IU/mL (ref 0.30–0.70)

## 2021-05-02 LAB — CBC
HCT: 34.5 % — ABNORMAL LOW (ref 39.0–52.0)
Hemoglobin: 11.9 g/dL — ABNORMAL LOW (ref 13.0–17.0)
MCH: 32.8 pg (ref 26.0–34.0)
MCHC: 34.5 g/dL (ref 30.0–36.0)
MCV: 95 fL (ref 80.0–100.0)
Platelets: 113 10*3/uL — ABNORMAL LOW (ref 150–400)
RBC: 3.63 MIL/uL — ABNORMAL LOW (ref 4.22–5.81)
RDW: 13.9 % (ref 11.5–15.5)
WBC: 9.1 10*3/uL (ref 4.0–10.5)
nRBC: 0 % (ref 0.0–0.2)

## 2021-05-02 LAB — COMPREHENSIVE METABOLIC PANEL
ALT: 58 U/L — ABNORMAL HIGH (ref 0–44)
AST: 53 U/L — ABNORMAL HIGH (ref 15–41)
Albumin: 3 g/dL — ABNORMAL LOW (ref 3.5–5.0)
Alkaline Phosphatase: 36 U/L — ABNORMAL LOW (ref 38–126)
Anion gap: 9 (ref 5–15)
BUN: 66 mg/dL — ABNORMAL HIGH (ref 8–23)
CO2: 22 mmol/L (ref 22–32)
Calcium: 8.4 mg/dL — ABNORMAL LOW (ref 8.9–10.3)
Chloride: 98 mmol/L (ref 98–111)
Creatinine, Ser: 3.09 mg/dL — ABNORMAL HIGH (ref 0.61–1.24)
GFR, Estimated: 20 mL/min — ABNORMAL LOW (ref >60)
Glucose, Bld: 123 mg/dL — ABNORMAL HIGH (ref 70–99)
Potassium: 3.2 mmol/L — ABNORMAL LOW (ref 3.5–5.1)
Sodium: 129 mmol/L — ABNORMAL LOW (ref 135–145)
Total Bilirubin: 1.1 mg/dL (ref 0.3–1.2)
Total Protein: 5.5 g/dL — ABNORMAL LOW (ref 6.5–8.1)

## 2021-05-02 LAB — BASIC METABOLIC PANEL
Anion gap: 10 (ref 5–15)
Anion gap: 12 (ref 5–15)
BUN: 61 mg/dL — ABNORMAL HIGH (ref 8–23)
BUN: 61 mg/dL — ABNORMAL HIGH (ref 8–23)
CO2: 22 mmol/L (ref 22–32)
CO2: 23 mmol/L (ref 22–32)
Calcium: 8.7 mg/dL — ABNORMAL LOW (ref 8.9–10.3)
Calcium: 9 mg/dL (ref 8.9–10.3)
Chloride: 94 mmol/L — ABNORMAL LOW (ref 98–111)
Chloride: 95 mmol/L — ABNORMAL LOW (ref 98–111)
Creatinine, Ser: 2.75 mg/dL — ABNORMAL HIGH (ref 0.61–1.24)
Creatinine, Ser: 2.88 mg/dL — ABNORMAL HIGH (ref 0.61–1.24)
GFR, Estimated: 23 mL/min — ABNORMAL LOW (ref >60)
GFR, Estimated: 22 mL/min — ABNORMAL LOW (ref >60)
Glucose, Bld: 325 mg/dL — ABNORMAL HIGH (ref 70–99)
Glucose, Bld: 221 mg/dL — ABNORMAL HIGH (ref 70–99)
Potassium: 3.4 mmol/L — ABNORMAL LOW (ref 3.5–5.1)
Potassium: 3.7 mmol/L (ref 3.5–5.1)
Sodium: 126 mmol/L — ABNORMAL LOW (ref 135–145)
Sodium: 130 mmol/L — ABNORMAL LOW (ref 135–145)

## 2021-05-02 MED ORDER — POTASSIUM CHLORIDE CRYS ER 20 MEQ PO TBCR
20.0000 meq | EXTENDED_RELEASE_TABLET | Freq: Once | ORAL | Status: AC
  Administered 2021-05-02: 11:00:00 20 meq via ORAL
  Filled 2021-05-02: qty 1

## 2021-05-02 MED ORDER — TOLVAPTAN 15 MG PO TABS
15.0000 mg | ORAL_TABLET | Freq: Once | ORAL | Status: AC
  Administered 2021-05-02: 17:00:00 15 mg via ORAL
  Filled 2021-05-02: qty 1

## 2021-05-02 MED ORDER — SENNOSIDES-DOCUSATE SODIUM 8.6-50 MG PO TABS
1.0000 | ORAL_TABLET | Freq: Two times a day (BID) | ORAL | Status: DC
  Administered 2021-05-02 – 2021-05-28 (×41): 1 via ORAL
  Filled 2021-05-02 (×42): qty 1

## 2021-05-02 MED ORDER — POLYETHYLENE GLYCOL 3350 17 G PO PACK
17.0000 g | PACK | Freq: Every day | ORAL | Status: DC
  Administered 2021-05-02 – 2021-05-04 (×3): 17 g via ORAL
  Filled 2021-05-02 (×5): qty 1

## 2021-05-02 NOTE — TOC Benefit Eligibility Note (Signed)
Patient Teacher, English as a foreign language completed.    The patient is currently admitted and upon discharge could be taking Farxiga 10 mg.  The current 30 day co-pay is, $47.00.   The patient is currently admitted and upon discharge could be taking Jardiance 10 mg.  The current 30 day co-pay is, $47.00.   The patient is currently admitted and upon discharge could be taking Entresto 24-26 mg.  The current 30 day co-pay is, $47.00.   The patient is insured through [employer] Patient Advocate Specialist [place] Patient Advocate Team Direct Number: [phone]  Fax: [phone]

## 2021-05-02 NOTE — TOC Initial Note (Signed)
Transition of Care [facility]) - Initial/Assessment Note    Patient Details  Name: Ruben [name] MRN: [id number] Date of Birth: 1946/01/16  Transition of Care [facility]) CM/SW Contact:    Ruben Stogdill, LCSW Phone Number: 05/02/2021, 3:21 PM  Clinical Narrative:                 HF CSW spoke with Ruben [name] and family at bedside to address the SA consult and the CSW completed a very brief SDOH with the patient who denied having any needs at this time. Ruben [name] family reported some concerns about bills and maybe water or power getting shut off and CSW encouraged family to bring in shut off notices to see if the HF team can help with getting things turned back on if needed. Ruben [name] reported he does have a PCP, Ruben Pulling, FNP, and he can get to the pharmacy to pick up his medications as he is still driving. CSW provided the patient with the social workers name and position and if anything changes to please reach out so that the CSW can provide support.  CSW will continue to follow throughout discharge.    Barriers to Discharge: Continued Medical Work up   Patient Goals and CMS Choice Patient states their goals for this hospitalization and ongoing recovery are:: to return home      Expected Discharge Plan and Services   In-house Referral: Clinical Social Work Discharge Planning Services: CM Consult   Living arrangements for the past 2 months: [place]                                      Prior Living Arrangements/Services Living arrangements for the past 2 months: Single Family Home Lives with:: Self, Spouse Patient language and need for interpreter reviewed:: Yes Do you feel safe going back to the place where you live?: Yes      Need for Family Participation in Patient Care: No (Comment) Care giver support system in place?: No (comment)   Criminal Activity/Legal Involvement Pertinent to Current Situation/Hospitalization: No - Comment as  needed  Activities of Daily Living      Permission Sought/Granted Permission sought to share information with : Family Supports Permission granted to share information with : Yes, Verbal Permission Granted  Share Information with NAME: Ruben [name]     Permission granted to share info w Relationship: Significant other  Permission granted to share info w Contact Information: [phone]  [phone]  Emotional Assessment Appearance:: Appears stated age Attitude/Demeanor/Rapport: Engaged Affect (typically observed): Pleasant Orientation: : Oriented to Self, Oriented to Place, Oriented to  Time, Oriented to Situation Alcohol / Substance Use: Alcohol Use Psych Involvement: No (comment)  Admission diagnosis:  NSTEMI (non-ST elevated myocardial infarction) ([place]) [I21.4] Atrial fibrillation with RVR ([place]) [I48.91] Acute myocardial infarction [facility]) [I21.9] Patient Active Problem List   Diagnosis Date Noted   Acute myocardial infarction ([place]) 04/29/2021   Stage 3 chronic kidney disease ([place]) 07/12/2018   Prediabetes 07/12/2018   Right upper quadrant abdominal pain 08/10/2017   Right knee pain 02/15/2016   Right patella fracture 02/12/2016   HTN (hypertension) 02/21/2013   Tobacco abuse 02/21/2013   HLD (hyperlipidemia) 02/21/2013   PCP:  Ruben Brine, FNP Pharmacy:   [facility] [place] [place]  [place] [address] Phone: [phone] Fax: [phone]     Social Determinants of Health (SDOH) Interventions Food Insecurity Interventions: Intervention Not Indicated Financial Strain Interventions: Other (Comment) (Pt.'s family reported some concerns about finances and lights or water getting turned off. CSW provided contact infor. to bring in shut off notice to see if hf team can help support.) Housing Interventions: Intervention Not Indicated Transportation  Interventions: Intervention Not Indicated  Readmission Risk Interventions No flowsheet data found.  Ruben [name], MSW, [place] Heart Failure Social Worker

## 2021-05-02 NOTE — Consult Note (Addendum)
Advanced Heart Failure Team Consult Note   Primary Physician: [name], FNP PCP-Cardiologist:  Sanda Klein, MD  Reason for Consultation: Cardiogenic shock  HPI:    Ruben [name] is seen today for evaluation of cardiogenic shock at the request of Dr. Ellyn Hack.   75 y.o. male with history of HTN, HLD, CKD, CKD 3b, tobacco and alcohol abuse (pint a day of wine), depression. Admitted 04/29/21 with suspected late presentation MI. Had CP throughout day prior to admit. Seen in urgent care and was hypoxic with O2 sats 88-91% on RA. Transferred to ED for workup. Went into AF with RVR and placed on BiPAP. HS troponin 20,091, BNP 1659, AST 190. Scr 1.68 (at baseline 1.4-1.7). He was initiated on IV amio, heparin gtt, nitro gtt and IV diuretics. Admitted to ICU for further management.   Echo with LVEF 35-40%, inferior hypokinesis, RV okay, splay artifact with likely moderate to severe MR  Worsening hypotension yesterday. Cath deferred d/t rising Cr, up to 3.9.  Co-ox 47% yesterday afternoon. Now on 0.25 milrinone + 5 NE with co-ox 75%  CVP 8-9. Weight up 10 lb from admit.  Feels okay. Denies recurrent CP. No dyspnea. Made ~ 465 cc urine yesterday.    Review of Systems: [y] = yes, [ ]  = no   General: Weight gain [ ] ; Weight loss [ ] ; Anorexia [ ] ; Fatigue [ ] ; Fever [ ] ; Chills [ ] ; Weakness [ ]   Cardiac: Chest pain/pressure [Y ]; Resting SOB [Y]; Exertional SOB [ ] ; Orthopnea [ ] ; Pedal Edema [ ] ; Palpitations [ ] ; Syncope [ ] ; Presyncope [ ] ; Paroxysmal nocturnal dyspnea[ ]   Pulmonary: Cough [ ] ; Wheezing[ ] ; Hemoptysis[ ] ; Sputum [ ] ; Snoring [ ]   GI: Vomiting[ ] ; Dysphagia[ ] ; Melena[ ] ; Hematochezia [ ] ; Heartburn[ ] ; Abdominal pain [ ] ; Constipation [ ] ; Diarrhea [ ] ; BRBPR [ ]   GU: Hematuria[ ] ; Dysuria [ ] ; Nocturia[ ]   Vascular: Pain in legs with walking [ ] ; Pain in feet with lying flat [ ] ; Non-healing sores [ ] ; Stroke [ ] ; TIA [ ] ; Slurred speech [ ] ;  Neuro:  Headaches[ ] ; Vertigo[ ] ; Seizures[ ] ; Paresthesias[ ] ;Blurred vision [ ] ; Diplopia [ ] ; Vision changes [ ]   Ortho/Skin: Arthritis [ ] ; Joint pain [ ] ; Muscle pain [ ] ; Joint swelling [ ] ; Back Pain [ ] ; Rash [ ]   Psych: Depression[Y]; Anxiety[ ]   Heme: Bleeding problems [ ] ; Clotting disorders [ ] ; Anemia [ ]   Endocrine: Diabetes [ ] ; Thyroid dysfunction[ ]   Home Medications Prior to Admission medications   Medication Sig Start Date End Date Taking? Authorizing Provider  acetaminophen (TYLENOL) 650 MG CR tablet Take 650 mg by mouth every 8 (eight) hours as needed for pain.   Yes [provider]  allopurinol (ZYLOPRIM) 100 MG tablet TAKE 1 TABLET(100 MG) BY MOUTH TWICE DAILY Patient taking differently: Take 100 mg by mouth 2 (two) times daily. 04/15/21  Yes [name], FNP  aspirin 81 MG tablet Take 81 mg by mouth daily.   Yes [provider]  buPROPion (WELLBUTRIN XL) 150 MG 24 hr tablet Take 1 tablet (150 mg total) by mouth every morning. 01/23/20 04/29/21 Yes [name], FNP  cholecalciferol (VITAMIN D3) 25 MCG (1000 UNIT) tablet Take 1,000 Units by mouth daily.   Yes [provider]  diclofenac Sodium (VOLTAREN) 1 % GEL Apply 2 g topically 4 (four) times daily. Patient taking differently: Apply 2 g topically daily as needed (pain).  10/24/19  Yes [name], FNP  linaCLOtide (LINZESS PO) Take 1 tablet by mouth daily. Not sure about mg; got samples from doctor   Yes [provider]  Magnesium 200 MG TABS Take 1 tablet (200 mg total) by mouth daily. With evening meal Patient taking differently: Take 200 mg by mouth daily. 10/22/20  Yes [name], FNP  methocarbamol (ROBAXIN) 500 MG tablet TAKE 1 TABLET(500 MG) BY MOUTH EVERY 8 HOURS AS NEEDED FOR MUSCLE SPASMS Patient taking differently: Take 500 mg by mouth every 8 (eight) hours as needed for muscle spasms. 10/22/20  Yes [name], FNP  olmesartan (BENICAR) 40 MG tablet Take 1 tablet (40 mg  total) by mouth daily. 02/19/21 02/19/22 Yes [name], FNP  simvastatin (ZOCOR) 40 MG tablet Take 1 tablet (40 mg total) by mouth every evening. 10/22/20  Yes [name], FNP  tadalafil (CIALIS) 5 MG tablet Take 1 tablet (5 mg total) by mouth as needed for erectile dysfunction. Patient taking differently: Take 5 mg by mouth daily as needed for erectile dysfunction. 12/19/20  Yes [name], FNP  albuterol (PROVENTIL) (2.5 MG/3ML) 0.083% nebulizer solution Take 3 mLs (2.5 mg total) by nebulization every 4 (four) hours as needed for wheezing or shortness of breath. Patient not taking: Reported on 04/29/2021 04/03/21 04/03/22  [name], FNP  Meloxicam 15 MG TBDP Take 1 tablet by mouth daily. For 5 days then take once a day as needed Patient not taking: Reported on 04/29/2021 02/18/21   [name], FNP  polyethylene glycol powder (GLYCOLAX/MIRALAX) powder Take 17 g by mouth 2 (two) times daily as needed. Patient not taking: Reported on 04/29/2021 03/04/16   McVey, Gelene Mink, PA-C    Past Medical History: Past Medical History:  Diagnosis Date   Arthritis    Chronic kidney disease, stage 3b ([place])    ETOH abuse    History of stress test 09/02/2008   showed inferolateral scar without ischemia   Hx of echocardiogram 09/02/2008   was essentially normal   Hyperlipidemia    Hypertension    Tobacco abuse     Past Surgical History: Past Surgical History:  Procedure Laterality Date   BACK SURGERY     Dr Luiz Ochoa involving L3-L4 discectomy.   HERNIA REPAIR     SPINE SURGERY      Family History: Family History  Problem Relation Age of Onset   Heart attack Brother    Diabetes Brother    Heart disease Brother    Cancer Father    Diabetes Brother    Heart disease Brother     Social History: Social History   Socioeconomic History   Marital status: Single    Spouse name: Not on file   Number of children: Not on file   Years of education: Not on file   Highest  education level: Not on file  Occupational History   Not on file  Tobacco Use   Smoking status: Some Days    Packs/day: 1.50    Years: 20.00    Pack years: 30.00    Types: Cigarettes   Smokeless tobacco: Never   Tobacco comments:    down to 2 packs per week; 3/21 - down to 1 Pack per week  Substance and Sexual Activity   Alcohol use: No    Alcohol/week: 0.0 standard drinks   Drug use: No   Sexual activity: Not on file  Other Topics Concern   Not on file  Social History Narrative  Not on file   Social Determinants of Health   Financial Resource Strain: Not on file  Food Insecurity: Not on file  Transportation Needs: Not on file  Physical Activity: Not on file  Stress: Not on file  Social Connections: Not on file    Allergies:  Allergies  Allergen Reactions   Other Other (See Comments)    Pollen - unknown    Objective:    Vital Signs:   Temp:  [97.8 F (36.6 C)-98.6 F (37 C)] 97.8 F (36.6 C) (12/29 0808) Pulse Rate:  [81-107] 84 (12/29 0630) Resp:  [16-33] 17 (12/29 0630) BP: (61-108)/(26-87) 92/58 (12/29 0630) SpO2:  [95 %-99 %] 99 % (12/29 0630) Weight:  [82 kg] 82 kg (12/29 0430) Last BM Date: 04/28/21  Weight change: Filed Weights   04/30/21 0500 05/01/21 0500 05/02/21 0430  Weight: 77.7 kg 79.8 kg 82 kg    Intake/Output:   Intake/Output Summary (Last 24 hours) at 05/02/2021 0817 Last data filed at 05/02/2021 0600 Gross per 24 hour  Intake 1269.82 ml  Output 465 ml  Net 804.82 ml      Physical Exam    General:  Lying comfortably in bed HEENT: normal Neck: supple. JVP 10 cm. Carotids 2+ bilat; no bruits. No lymphadenopathy or thyromegaly appreciated. Cor: PMI nondisplaced. Irregular rhythm, tachy. No rubs, gallops, 2/6 HSM  Lungs: clear Abdomen: soft, nontender, nondistended. Extremities: no cyanosis, clubbing, rash, edema, + RUE PICC  Neuro: alert & orientedx3, cranial nerves grossly intact. moves all 4 extremities w/o difficulty.  Affect pleasant   Telemetry   AF with rates 80s-90s, 7 beat NSVT  Labs   Basic Metabolic Panel: Recent Labs  Lab 04/29/21 1251 04/29/21 1405 04/30/21 0227 05/01/21 0116 05/02/21 0429  NA 140 140 138 135 129*  K 3.9 3.6 4.2 4.2 3.2*  CL 108  --  109 103 98  CO2 16*  --  15* 19* 22  GLUCOSE 174*  --  172* 112* 123*  BUN 20  --  29* 51* 66*  CREATININE 1.68*  --  2.24* 2.45* 3.09*  CALCIUM 9.4  --  9.2 9.0 8.4*  MG 1.9  --   --   --   --     Liver Function Tests: Recent Labs  Lab 04/29/21 1251 04/30/21 0227 05/01/21 0116 05/02/21 0429  AST 190* 155* 102* 53*  ALT 43 44 56* 58*  ALKPHOS 58 44 43 36*  BILITOT 1.4* 0.6 0.7 1.1  PROT 7.7 6.8 6.2* 5.5*  ALBUMIN 4.0 3.7 3.6 3.0*   No results for input(s): LIPASE, AMYLASE in the last 168 hours. No results for input(s): AMMONIA in the last 168 hours.  CBC: Recent Labs  Lab 04/29/21 1251 04/29/21 1405 04/30/21 0227 05/01/21 0116 05/02/21 0429  WBC 11.2*  --  8.8 14.8* 9.1  NEUTROABS 9.5*  --   --   --   --   HGB 16.1 16.0 14.9 13.5 11.9*  HCT 48.0 47.0 42.7 39.9 34.5*  MCV 97.4  --  94.7 94.8 95.0  PLT 160  --  139* 122* 113*    Cardiac Enzymes: No results for input(s): CKTOTAL, CKMB, CKMBINDEX, TROPONINI in the last 168 hours.  BNP: BNP (last 3 results) Recent Labs    04/29/21 1251  BNP 1,659.6*    ProBNP (last 3 results) No results for input(s): PROBNP in the last 8760 hours.   CBG: Recent Labs  Lab 04/29/21 2149  GLUCAP 219*    Coagulation Studies:  Recent Labs    04/29/21 1531  LABPROT 13.8  INR 1.1     Imaging   DG Chest Port 1 View  Result Date: 05/01/2021 CLINICAL DATA:  Status post PICC line placement. EXAM: PORTABLE CHEST 1 VIEW COMPARISON:  05/01/2021 FINDINGS: Right arm PICC line has been placed. The tip is in the region of the SVC. Heart and mediastinum are stable. Increased hazy and patchy densities in lower lungs compared to the recent exam and likely associated with  atelectasis. IMPRESSION: PICC line tip in the SVC region. Electronically Signed   By: Markus Daft M.D.   On: 05/01/2021 11:56   DG CHEST PORT 1 VIEW  Result Date: 05/01/2021 CLINICAL DATA:  Leukocytosis.  Wheezing. EXAM: PORTABLE CHEST 1 VIEW COMPARISON:  04/29/2021 FINDINGS: The cardiac silhouette remains borderline enlarged. Minimal somewhat patchy opacity in the right lower lung zone. Clear left lung. Normal vascularity. No acute bony abnormality. IMPRESSION: Possible minimal right lower lung zone pneumonia or patchy atelectasis. Electronically Signed   By: Claudie Revering M.D.   On: 05/01/2021 09:19   [place] EKG SITE RITE  Result Date: 05/01/2021 If Site Rite image not attached, placement could not be confirmed due to current cardiac rhythm.    Medications:     Current Medications:  amiodarone  400 mg Oral BID   aspirin EC  81 mg Oral Daily   atorvastatin  80 mg Oral Daily   Chlorhexidine Gluconate Cloth  6 each Topical Daily   folic acid  1 mg Oral Daily   LORazepam  0-4 mg Intravenous Q8H   mouth rinse  15 mL Mouth Rinse BID   multivitamin with minerals  1 tablet Oral Daily   sodium chloride flush  10-40 mL Intracatheter Q12H   sodium chloride flush  3 mL Intravenous Q12H   thiamine  100 mg Oral Daily   Or   thiamine  100 mg Intravenous Daily    Infusions:  sodium chloride     heparin 1,000 Units/hr (05/02/21 0600)   milrinone 0.25 mcg/kg/min (05/02/21 0600)   norepinephrine (LEVOPHED) Adult infusion 5 mcg/min (05/02/21 4917)      Patient Profile   75 y.o. male with history of HTN, HLD, CKD 3b, tobacco abuse and alcohol abuse. Admitted with late presentation MI complicated by cardiogenic shock.  Assessment/Plan   Cardiogenic shock: -Likely d/t late presentation MI, symptoms started 24 hrs prior to admission -HS troponin > 20K -Echo EF 35-40%, inferior hypokinesis, RV okay, moderate to severe MR (? Ischemic) -Initial co-ox 47%, lactic acid 1.5 -Co-ox currently 75%  with milrinone 0.25 + NE 5 -CVP 8-9. Weight up 10 lb from admit. -Consider challenge with IV lasix. Not making much urine and Scr trending up. -GDMT limited by low BP/shock and AKI on CKD  2. NSTEMI with late presentation: -HS troponin > 20,000 -Initial ECG with sinus tachycardia and ? ST changes inferolateral leads -Cath deferred for now d/t renal impairment -On asa 81 mg + 80 mg atorvastatin -No beta blocker d/t shock -continues on heparin gtt for AF  3. Mitral regurgitation -Moderate to severe MR noted on echo this admit, ? ischemic  4. Atrial fibrillation with RVR/? AFL: -new this admit -Amio gtt switched to po 400 mg BID yesterday to minimize extra volume. Rate okay today -Heparin gtt  5. Alcohol abuse: -1 pint of wine a day prior to admission -depression probably playing a role -will consult social worker -CIWA  6. Tobacco abuse: -smoking 1ppd, smoker > 60 years -  cessation discussed  7. AKI on CKD 3b: -Baseline scr 1.4-1.6 -Scr now up to 3.09 in setting of shock -concerned about UOP, consider challenge with IV lasix  8. Leukocytosis -WBC 11.>8.8>14.8>9.1 -BC X 2 with NG X 1 day -UA okay on 12/28 -AF  9. Acute respiratory failure with hypoxia -Now off BiPAP -O2 stable on 5L [place]  10. Elevated LFTs -Mildly increased -Likely d/t #1  11. Hyponatremia -Na 865>784>696 -Follow -Restrict free water  12. Hypokalemia -K 3.2 -Supp  13. Thrombocytopenia -Plts 139 > 122 > 113 -Follow with anticoagulation   Length of Stay: 3  FINCH, LINDSAY N, PA-C  05/02/2021, 8:17 AM  Advanced Heart Failure Team Pager [phone] (M-F; 7a - 5p)  Please contact [facility] for night-coverage after hours (4p -7a ) and weekends on [email or web address]   Patient seen with PA, agree with the above note.   History as outlined above.  Suspect late presentation MI (likely LCx territory based on echo) with infarct-related MR and atrial fibrillation.  Developed hypotension and low  output, now on milrinone 0.25 and NE 5 with Co-ox 74%, 57%.  CVP 9-10 this afternoon.   General: NAD Neck: JVP 8-9 cm, no thyromegaly or thyroid nodule.  Lungs: Clear to auscultation bilaterally with normal respiratory effort. CV: Nondisplaced PMI.  Heart irregular S1/S2, no S3/S4, 1/6 HSM apex.  No peripheral edema.   Abdomen: Soft, nontender, no hepatosplenomegaly, no distention.  Skin: Intact without lesions or rashes.  Neurologic: Alert and oriented x 3.  Psych: Normal affect. Extremities: No clubbing or cyanosis.  HEENT: Normal.   1. CAD: Suspect delayed presentation LCx (versus RCA) MI.  Echo with EF 35-40%, inferolateral and basal inferior akinesis, moderate-severe eccentric likely infarct-related MR, normal RV.  He was not cathed initially due to AKI and delayed presentation.  - Continue statin - Continue aspirin.  - Follow creatinine, will need RHC/LHC when improves.  Unlikely to benefit significantly from opening of the infarct-related artery at this point, but need to look for surgical disease.  - If no surgical disease, would add Plavix.  2. Acute systolic CHF/cardiogenic shock: Ischemic cardiomyopathy with infarct-related MR. Echo with EF 35-40%, inferolateral and basal inferior akinesis, moderate-severe eccentric likely infarct-related MR, normal RV.  Echo does not look bad enough to explain low output HF, concerned that MR may be playing a role.  CVP 9-10 with adequate co-ox now on NE 5, milrinone 0.25.  MAP stable.  - Will hold off on Lasix while we await renal recovery.  - Continue current milrinone and NE for now as we await renal recovery.  3. Hyponatremia: Suspect hypervolemic hypernatremia.  Na 126.  - Tolvaptan 15 mg x 1.  4. Mitral regurgitation: Highly eccentric MR, suspect infarct-related with inferolateral/inferior WMAs.  May be playing a role in low output, probably not fully visualized by TTE (at least moderate-severe).  - Need full evaluation by TEE.  Would hold  off for now and combine as TEE-DCCV in future (?after cath).  5. Atrial fibrillation: First noted this admission.  Rate is controlled, now on po amiodarone.  - Continue heparin gtt.  - Continue po amiodarone.  - If he does not convert on his own, will need TEE-guided DCCV.  Likely after cath if creatinine improves and weaning of pressor/inotrope.  6. AKI on CKD 3: Baseline creatinine around 1.4. Up to 3.09, now coming down 2.75.   - Continue to support cardiac output. 7. Active smoker: Needs to quit.  8. ETOH abuse: Needs to  cut back.   Loralie Champagne 05/02/2021 4:52 PM

## 2021-05-02 NOTE — Progress Notes (Addendum)
ANTICOAGULATION CONSULT NOTE  Pharmacy Consult for Heparin Indication: chest pain/ACS  Allergies  Allergen Reactions   Other Other (See Comments)    Pollen - unknown    Patient Measurements: Height: 5\' 11"  (180.3 cm) Weight: 82 kg (180 lb 12.4 oz) IBW/kg (Calculated) : 75.3 Heparin Dosing Weight: 81.6 kg  Vital Signs: Temp: 97.8 F (36.6 C) (12/29 0808) Temp Source: Oral (12/29 0402) BP: 92/58 (12/29 0630) Pulse Rate: 84 (12/29 0630)  Labs: Recent Labs    04/29/21 1251 04/29/21 1251 04/29/21 1405 04/29/21 1530 04/29/21 1531 04/30/21 0227 04/30/21 1031 05/01/21 0116 05/02/21 0429  HGB 16.1  --    < >  --   --  14.9  --  13.5 11.9*  HCT 48.0  --    < >  --   --  42.7  --  39.9 34.5*  PLT 160  --   --   --   --  139*  --  122* 113*  LABPROT  --   --   --   --  13.8  --   --   --   --   INR  --   --   --   --  1.1  --   --   --   --   HEPARINUNFRC  --    < >  --   --   --  0.51 0.56 0.46 0.25*  CREATININE 1.68*  --   --   --   --  2.24*  --  2.45* 3.09*  TROPONINIHS 20,091*  --   --  19,339*  --   --   --   --   --    < > = values in this interval not displayed.     Estimated Creatinine Clearance: 22 mL/min (A) (by C-G formula based on SCr of 3.09 mg/dL (H)).   Medical History: Past Medical History:  Diagnosis Date   Arthritis    Chronic kidney disease, stage 3b ([place])    ETOH abuse    History of stress test 09/02/2008   showed inferolateral scar without ischemia   Hx of echocardiogram 09/02/2008   was essentially normal   Hyperlipidemia    Hypertension    Tobacco abuse     Medications:   Infusions:   sodium chloride     heparin 1,000 Units/hr (05/02/21 0600)   milrinone 0.25 mcg/kg/min (05/02/21 0600)   norepinephrine (LEVOPHED) Adult infusion 5 mcg/min (05/02/21 0814)   PRN:   Assessment: 70 yom presenting in respiratory distress. Heparin per pharmacy consult placed for chest pain/ACS.  Awaiting possible cath once renal function improving.     Heparin level is subtherapeutic at 0.25, on 1000 units/hr. Hgb 11.9, plt 113. No s/sx of bleeding or infusion issues.  Goal of Therapy:  Heparin level 0.3-0.7 units/ml Monitor platelets by anticoagulation protocol: Yes   Plan:  Increase IV heparin to 1150 units/hr  Order heparin level in 8 hours  Daily heparin level and CBC. F/u plans for cath lab eventually.  Antonietta Jewel, PharmD, [place] Clinical Pharmacist  Phone: [phone] 05/02/2021 8:23 AM  Please check AMION for all [place] phone numbers After 10:00 PM, call [place] [phone]

## 2021-05-02 NOTE — Progress Notes (Signed)
ANTICOAGULATION CONSULT NOTE - Follow Up Consult  Pharmacy Consult for Heparin Indication: CP/ACS  Allergies  Allergen Reactions   Other Other (See Comments)    Pollen - unknown    Patient Measurements: Height: 5\' 11"  (180.3 cm) Weight: 82 kg (180 lb 12.4 oz) IBW/kg (Calculated) : 75.3 Heparin Dosing Weight: 82 kg  Vital Signs: Temp: 98.6 F (37 C) (12/29 1956) Temp Source: Oral (12/29 1956) BP: 102/62 (12/29 1900) Pulse Rate: 88 (12/29 1900)  Labs: Recent Labs    04/30/21 0227 04/30/21 1031 05/01/21 0116 05/02/21 0429 05/02/21 1529 05/02/21 1720 05/02/21 2034  HGB 14.9  --  13.5 11.9*  --   --   --   HCT 42.7  --  39.9 34.5*  --   --   --   PLT 139*  --  122* 113*  --   --   --   HEPARINUNFRC 0.51   < > 0.46 0.25*  --   --  0.30  CREATININE 2.24*  --  2.45* 3.09* 2.75* 2.88*  --    < > = values in this interval not displayed.    Estimated Creatinine Clearance: 23.6 mL/min (A) (by C-G formula based on SCr of 2.88 mg/dL (H)).   Assessment: Anticoag: not on ac pta; new onset afib > IV heparin Heparin level 0.25>>0.3 now back in goal range  Goal of Therapy:  Heparin level 0.3-0.7 units/ml Monitor platelets by anticoagulation protocol: Yes   Plan:  Con't heparin 1150 units/hr Daily HL and CBC   Mattia Osterman S. Alford Highland, PharmD, BCPS Clinical Staff Pharmacist [email or web address] Alford Highland, Khilynn Borntreger Stillinger 05/02/2021,9:20 PM

## 2021-05-03 DIAGNOSIS — R570 Cardiogenic shock: Secondary | ICD-10-CM | POA: Diagnosis not present

## 2021-05-03 LAB — CBC
HCT: 35.1 % — ABNORMAL LOW (ref 39.0–52.0)
Hemoglobin: 11.8 g/dL — ABNORMAL LOW (ref 13.0–17.0)
MCH: 32.2 pg (ref 26.0–34.0)
MCHC: 33.6 g/dL (ref 30.0–36.0)
MCV: 95.6 fL (ref 80.0–100.0)
Platelets: 114 10*3/uL — ABNORMAL LOW (ref 150–400)
RBC: 3.67 MIL/uL — ABNORMAL LOW (ref 4.22–5.81)
RDW: 13.7 % (ref 11.5–15.5)
WBC: 7.2 10*3/uL (ref 4.0–10.5)
nRBC: 0 % (ref 0.0–0.2)

## 2021-05-03 LAB — COMPREHENSIVE METABOLIC PANEL
ALT: 49 U/L — ABNORMAL HIGH (ref 0–44)
AST: 35 U/L (ref 15–41)
Albumin: 3 g/dL — ABNORMAL LOW (ref 3.5–5.0)
Alkaline Phosphatase: 38 U/L (ref 38–126)
Anion gap: 8 (ref 5–15)
BUN: 60 mg/dL — ABNORMAL HIGH (ref 8–23)
CO2: 25 mmol/L (ref 22–32)
Calcium: 9.1 mg/dL (ref 8.9–10.3)
Chloride: 103 mmol/L (ref 98–111)
Creatinine, Ser: 2.73 mg/dL — ABNORMAL HIGH (ref 0.61–1.24)
GFR, Estimated: 24 mL/min — ABNORMAL LOW (ref >60)
Glucose, Bld: 118 mg/dL — ABNORMAL HIGH (ref 70–99)
Potassium: 4.1 mmol/L (ref 3.5–5.1)
Sodium: 136 mmol/L (ref 135–145)
Total Bilirubin: 1.2 mg/dL (ref 0.3–1.2)
Total Protein: 5.8 g/dL — ABNORMAL LOW (ref 6.5–8.1)

## 2021-05-03 LAB — MAGNESIUM: Magnesium: 2.1 mg/dL (ref 1.7–2.4)

## 2021-05-03 LAB — COOXEMETRY PANEL
Carboxyhemoglobin: 0.8 % (ref 0.5–1.5)
Methemoglobin: 0.9 % (ref 0.0–1.5)
O2 Saturation: 58.3 %
Total hemoglobin: 12.2 g/dL (ref 12.0–16.0)

## 2021-05-03 LAB — HEPARIN LEVEL (UNFRACTIONATED): Heparin Unfractionated: 0.37 IU/mL (ref 0.30–0.70)

## 2021-05-03 LAB — SODIUM
Sodium: 134 mmol/L — ABNORMAL LOW (ref 135–145)
Sodium: 134 mmol/L — ABNORMAL LOW (ref 135–145)

## 2021-05-03 MED ORDER — FUROSEMIDE 10 MG/ML IJ SOLN
80.0000 mg | Freq: Once | INTRAMUSCULAR | Status: AC
  Administered 2021-05-03: 10:00:00 80 mg via INTRAVENOUS
  Filled 2021-05-03: qty 8

## 2021-05-03 NOTE — Progress Notes (Signed)
Inpatient Diabetes Program Recommendations  AACE/ADA: New Consensus Statement on Inpatient Glycemic Control (2015)  Target Ranges:  Prepandial:   less than 140 mg/dL      Peak postprandial:   less than 180 mg/dL (1-2 hours)      Critically ill patients:  140 - 180 mg/dL   Lab Results  Component Value Date   GLUCAP 219 (H) 04/29/2021   HGBA1C 5.7 (H) 04/30/2021    Review of Glycemic Control  Latest Reference Range & Units 05/02/21 15:29 05/02/21 17:20 05/03/21 03:51  Glucose 70 - 99 mg/dL 325 (H) 221 (H) 118 (H)  (H): Data is abnormally high Diabetes history: No prior hx  Inpatient Diabetes Program Recommendations:   While in the hospital, consider: Glycemic control order set with -Novolog 0-9 units tid correction  Thank you, Nani Gasser. Penda Venturi, RN, MSN, CDE  Diabetes Coordinator Inpatient Glycemic Control Team Team Pager [phone] (8am-5pm) 05/03/2021 9:42 AM

## 2021-05-03 NOTE — Plan of Care (Signed)

## 2021-05-03 NOTE — Progress Notes (Signed)
Patient ID: Ruben [name], male   DOB: [birth year], 75 y.o.   MRN: [id number]     Advanced Heart Failure Rounding Note  PCP-Cardiologist: Sanda Klein, MD   Subjective:    Creatinine 2.88 => 2.73.  He remains on milrinone 0.25 and NE 2, SBP 90s with co-ox 58%. CVP 11-12.   He remains in atrial fibrillation in 70s on po amiodarone.  On heparin gtt.   Plts low at 114 but have been chronically low pre-heparin.     Objective:   Weight Range: 82.9 kg Body mass index is 25.49 kg/m.   Vital Signs:   Temp:  [98.4 F (36.9 C)-98.6 F (37 C)] 98.5 F (36.9 C) (12/30 0740) Pulse Rate:  [80-108] 86 (12/30 0740) Resp:  [9-29] 19 (12/30 0740) BP: (82-127)/(47-87) 99/74 (12/30 0700) SpO2:  [94 %-99 %] 98 % (12/30 0740) Weight:  [82.9 kg] 82.9 kg (12/30 0407) Last BM Date: 04/28/21  Weight change: Filed Weights   05/01/21 0500 05/02/21 0430 05/03/21 0407  Weight: 79.8 kg 82 kg 82.9 kg    Intake/Output:   Intake/Output Summary (Last 24 hours) at 05/03/2021 0948 Last data filed at 05/03/2021 0747 Gross per 24 hour  Intake 1173.48 ml  Output 1825 ml  Net -651.52 ml      Physical Exam    General:  Well appearing. No resp difficulty HEENT: Normal Neck: Supple. JVP 10. Carotids 2+ bilat; no bruits. No lymphadenopathy or thyromegaly appreciated. Cor: PMI nondisplaced. Irregular rate & rhythm. 2/6 HSM apex. Lungs: Clear Abdomen: Soft, nontender, nondistended. No hepatosplenomegaly. No bruits or masses. Good bowel sounds. Extremities: No cyanosis, clubbing, rash, edema Neuro: Alert & orientedx3, cranial nerves grossly intact. moves all 4 extremities w/o difficulty. Affect pleasant   Telemetry   Atrial fibrillation 70s  Labs    CBC Recent Labs    05/02/21 0429 05/03/21 0351  WBC 9.1 7.2  HGB 11.9* 11.8*  HCT 34.5* 35.1*  MCV 95.0 95.6  PLT 113* 578*   Basic Metabolic Panel Recent Labs    05/02/21 1720 05/03/21 0049 05/03/21 0351  NA 130* 134* 136  K  3.7  --  4.1  CL 95*  --  103  CO2 23  --  25  GLUCOSE 221*  --  118*  BUN 61*  --  60*  CREATININE 2.88*  --  2.73*  CALCIUM 9.0  --  9.1  MG  --   --  2.1   Liver Function Tests Recent Labs    05/02/21 1720 05/03/21 0351  AST 48* 35  ALT 58* 49*  ALKPHOS 41 38  BILITOT 1.1 1.2  PROT 6.2* 5.8*  ALBUMIN 3.2* 3.0*   No results for input(s): LIPASE, AMYLASE in the last 72 hours. Cardiac Enzymes No results for input(s): CKTOTAL, CKMB, CKMBINDEX, TROPONINI in the last 72 hours.  BNP: BNP (last 3 results) Recent Labs    04/29/21 1251  BNP 1,659.6*    ProBNP (last 3 results) No results for input(s): PROBNP in the last 8760 hours.   D-Dimer No results for input(s): DDIMER in the last 72 hours. Hemoglobin A1C No results for input(s): HGBA1C in the last 72 hours. Fasting Lipid Panel No results for input(s): CHOL, HDL, LDLCALC, TRIG, CHOLHDL, LDLDIRECT in the last 72 hours. Thyroid Function Tests No results for input(s): TSH, T4TOTAL, T3FREE, THYROIDAB in the last 72 hours.  Invalid input(s): FREET3  Other results:   Imaging    No results found.   Medications:  Scheduled Medications:  amiodarone  400 mg Oral BID   aspirin EC  81 mg Oral Daily   atorvastatin  80 mg Oral Daily   Chlorhexidine Gluconate Cloth  6 each Topical Daily   folic acid  1 mg Oral Daily   LORazepam  0-4 mg Intravenous Q8H   mouth rinse  15 mL Mouth Rinse BID   multivitamin with minerals  1 tablet Oral Daily   polyethylene glycol  17 g Oral Daily   senna-docusate  1 tablet Oral BID   sodium chloride flush  10-40 mL Intracatheter Q12H   sodium chloride flush  3 mL Intravenous Q12H   thiamine  100 mg Oral Daily   Or   thiamine  100 mg Intravenous Daily    Infusions:  sodium chloride     heparin 1,150 Units/hr (05/03/21 0700)   milrinone 0.25 mcg/kg/min (05/03/21 0700)   norepinephrine (LEVOPHED) Adult infusion 2 mcg/min (05/03/21 0700)    PRN Medications: sodium  chloride, nitroGLYCERIN, sodium chloride flush, sodium chloride flush    Assessment/Plan   1. CAD: Suspect delayed presentation LCx (versus RCA) MI.  Echo with EF 35-40%, inferolateral and basal inferior akinesis, moderate-severe eccentric likely infarct-related MR, normal RV.  He was not cathed initially due to AKI and delayed presentation.  - Continue statin - Continue aspirin.  - Follow creatinine, will need RHC/LHC when improves.  Unlikely to benefit significantly from opening of the infarct-related artery at this point, but need to look for surgical disease.  - If no surgical disease, would add Plavix.  2. Acute systolic CHF/cardiogenic shock: Ischemic cardiomyopathy with infarct-related MR. Echo with EF 35-40%, inferolateral and basal inferior akinesis, moderate-severe eccentric likely infarct-related MR, normal RV.  Echo does not look bad enough to explain low output HF, concerned that MR may be playing a role.  CVP 10-11 with co-ox 58% now on NE 2, milrinone 0.25.  SBP 90s.  Creatinine slowly decreasing.  - Lasix 80 mg IV x 1.  - Continue current milrinone and NE for now as we await renal recovery.  3. Hyponatremia: Resolved with dose of tolvaptan yesterday.  4. Mitral regurgitation: Highly eccentric MR, suspect infarct-related with inferolateral/inferior WMAs.  May be playing a role in low output, probably not fully visualized by TTE (at least moderate-severe).  - Need full evaluation by TEE.  Would hold off for now and combine as TEE-DCCV in future (?after cath).  5. Atrial fibrillation: First noted this admission.  Rate is controlled, now on po amiodarone.  - Continue heparin gtt.  - Continue po amiodarone.  - If he does not convert on his own, will need TEE-guided DCCV.  Likely after cath if creatinine improves and weaning of pressor/inotrope.  6. AKI on CKD 3: Baseline creatinine around 1.4. Up to 3.09, now coming down 2.73.   - Continue to support cardiac output. 7. Active  smoker: Needs to quit.  8. ETOH abuse: Needs to cut back.  9. Thrombocytopenia: Mild, pre-dates heparin initiation.   CRITICAL CARE Performed by: Loralie Champagne  Total critical care time: 35 minutes  Critical care time was exclusive of separately billable procedures and treating other patients.  Critical care was necessary to treat or prevent imminent or life-threatening deterioration.  Critical care was time spent personally by me on the following activities: development of treatment plan with patient and/or surrogate as well as nursing, discussions with consultants, evaluation of patient's response to treatment, examination of patient, obtaining history from patient or surrogate, ordering and  performing treatments and interventions, ordering and review of laboratory studies, ordering and review of radiographic studies, pulse oximetry and re-evaluation of patient's condition.   Length of Stay: 4  Loralie Champagne, MD  05/03/2021, 9:48 AM  Advanced Heart Failure Team Pager [phone] (M-F; 7a - 5p)  Please contact [facility] for night-coverage after hours (5p -7a ) and weekends on [email or web address]

## 2021-05-03 NOTE — Progress Notes (Signed)
Pt. Refused to go on the bipap. RT informed pt. To notify if he changes his mind. RN is aware.

## 2021-05-03 NOTE — Progress Notes (Signed)
ANTICOAGULATION CONSULT NOTE  Pharmacy Consult for Heparin Indication: chest pain/ACS  Allergies  Allergen Reactions   Other Other (See Comments)    Pollen - unknown    Patient Measurements: Height: 5\' 11"  (180.3 cm) Weight: 82.9 kg (182 lb 12.2 oz) IBW/kg (Calculated) : 75.3 Heparin Dosing Weight: 81.6 kg  Vital Signs: Temp: 98.5 F (36.9 C) (12/30 0740) Temp Source: Oral (12/30 0740) BP: 99/74 (12/30 0700) Pulse Rate: 86 (12/30 0740)  Labs: Recent Labs    05/01/21 0116 05/02/21 0429 05/02/21 1529 05/02/21 1720 05/02/21 2034 05/03/21 0351  HGB 13.5 11.9*  --   --   --  11.8*  HCT 39.9 34.5*  --   --   --  35.1*  PLT 122* 113*  --   --   --  114*  HEPARINUNFRC 0.46 0.25*  --   --  0.30 0.37  CREATININE 2.45* 3.09* 2.75* 2.88*  --  2.73*     Estimated Creatinine Clearance: 24.9 mL/min (A) (by C-G formula based on SCr of 2.73 mg/dL (H)).   Medical History: Past Medical History:  Diagnosis Date   Arthritis    Chronic kidney disease, stage 3b ([place])    ETOH abuse    History of stress test 09/02/2008   showed inferolateral scar without ischemia   Hx of echocardiogram 09/02/2008   was essentially normal   Hyperlipidemia    Hypertension    Tobacco abuse     Medications:   Infusions:   sodium chloride     heparin 1,150 Units/hr (05/03/21 0700)   milrinone 0.25 mcg/kg/min (05/03/21 0700)   norepinephrine (LEVOPHED) Adult infusion 2 mcg/min (05/03/21 0700)   PRN:   Assessment: 24 yom presenting in respiratory distress. Heparin per pharmacy consult placed for chest pain/ACS.  Awaiting possible cath once renal function improving.    Heparin level is therapeutic at 0.37, on 1150 units/hr. Hgb 11.8, plt 114 (baseline 160). No s/sx of bleeding or infusion issues.  Goal of Therapy:  Heparin level 0.3-0.7 units/ml Monitor platelets by anticoagulation protocol: Yes   Plan:  Continue IV heparin to 1150 units/hr  Daily heparin level and CBC. F/u plans for  cath lab eventually.  Antonietta Jewel, PharmD, [place] Clinical Pharmacist  Phone: [phone] 05/03/2021 9:43 AM  Please check AMION for all [facility] phone numbers After 10:00 PM, call [place] [phone]

## 2021-05-04 DIAGNOSIS — R570 Cardiogenic shock: Secondary | ICD-10-CM | POA: Diagnosis not present

## 2021-05-04 LAB — COMPREHENSIVE METABOLIC PANEL
ALT: 38 U/L (ref 0–44)
AST: 25 U/L (ref 15–41)
Albumin: 3 g/dL — ABNORMAL LOW (ref 3.5–5.0)
Alkaline Phosphatase: 36 U/L — ABNORMAL LOW (ref 38–126)
Anion gap: 9 (ref 5–15)
BUN: 58 mg/dL — ABNORMAL HIGH (ref 8–23)
CO2: 25 mmol/L (ref 22–32)
Calcium: 9.2 mg/dL (ref 8.9–10.3)
Chloride: 101 mmol/L (ref 98–111)
Creatinine, Ser: 2.63 mg/dL — ABNORMAL HIGH (ref 0.61–1.24)
GFR, Estimated: 25 mL/min — ABNORMAL LOW (ref >60)
Glucose, Bld: 227 mg/dL — ABNORMAL HIGH (ref 70–99)
Potassium: 3.8 mmol/L (ref 3.5–5.1)
Sodium: 135 mmol/L (ref 135–145)
Total Bilirubin: 1 mg/dL (ref 0.3–1.2)
Total Protein: 5.9 g/dL — ABNORMAL LOW (ref 6.5–8.1)

## 2021-05-04 LAB — CBC
HCT: 34.4 % — ABNORMAL LOW (ref 39.0–52.0)
Hemoglobin: 11.5 g/dL — ABNORMAL LOW (ref 13.0–17.0)
MCH: 31.9 pg (ref 26.0–34.0)
MCHC: 33.4 g/dL (ref 30.0–36.0)
MCV: 95.3 fL (ref 80.0–100.0)
Platelets: 125 10*3/uL — ABNORMAL LOW (ref 150–400)
RBC: 3.61 MIL/uL — ABNORMAL LOW (ref 4.22–5.81)
RDW: 13.8 % (ref 11.5–15.5)
WBC: 5.9 10*3/uL (ref 4.0–10.5)
nRBC: 0 % (ref 0.0–0.2)

## 2021-05-04 LAB — COOXEMETRY PANEL
Carboxyhemoglobin: 0.9 % (ref 0.5–1.5)
Methemoglobin: 0.9 % (ref 0.0–1.5)
O2 Saturation: 69.3 %
Total hemoglobin: 11.7 g/dL — ABNORMAL LOW (ref 12.0–16.0)

## 2021-05-04 LAB — HEPARIN LEVEL (UNFRACTIONATED): Heparin Unfractionated: 0.37 IU/mL (ref 0.30–0.70)

## 2021-05-04 LAB — MAGNESIUM: Magnesium: 2 mg/dL (ref 1.7–2.4)

## 2021-05-04 MED ORDER — POTASSIUM CHLORIDE CRYS ER 20 MEQ PO TBCR
20.0000 meq | EXTENDED_RELEASE_TABLET | Freq: Once | ORAL | Status: AC
  Administered 2021-05-04: 20 meq via ORAL
  Filled 2021-05-04: qty 1

## 2021-05-04 NOTE — Progress Notes (Signed)
Patient refused BIPAP for tonight. No respiratory distress noted. RT will monitor as needed.  

## 2021-05-04 NOTE — Progress Notes (Signed)
ANTICOAGULATION CONSULT NOTE  Pharmacy Consult for Heparin Indication: chest pain/ACS  Allergies  Allergen Reactions   Other Other (See Comments)    Pollen - unknown    Patient Measurements: Height: 5\' 11"  (180.3 cm) Weight: 82.3 kg (181 lb 7 oz) IBW/kg (Calculated) : 75.3 Heparin Dosing Weight: 81.6 kg  Vital Signs: Temp: 98.1 F (36.7 C) (12/31 0700) Temp Source: Oral (12/31 0700) BP: 123/70 (12/31 0900) Pulse Rate: 82 (12/31 0900)  Labs: Recent Labs    05/02/21 0429 05/02/21 1529 05/02/21 1720 05/02/21 2034 05/03/21 0351 05/04/21 0330  HGB 11.9*  --   --   --  11.8* 11.5*  HCT 34.5*  --   --   --  35.1* 34.4*  PLT 113*  --   --   --  114* 125*  HEPARINUNFRC 0.25*  --   --  0.30 0.37 0.37  CREATININE 3.09*   < > 2.88*  --  2.73* 2.63*   < > = values in this interval not displayed.     Estimated Creatinine Clearance: 25.8 mL/min (A) (by C-G formula based on SCr of 2.63 mg/dL (H)).   Medical History: Past Medical History:  Diagnosis Date   Arthritis    Chronic kidney disease, stage 3b ([place])    ETOH abuse    History of stress test 09/02/2008   showed inferolateral scar without ischemia   Hx of echocardiogram 09/02/2008   was essentially normal   Hyperlipidemia    Hypertension    Tobacco abuse     Medications:   Infusions:   sodium chloride     heparin 1,150 Units/hr (05/04/21 0900)   milrinone 0.25 mcg/kg/min (05/04/21 0900)   norepinephrine (LEVOPHED) Adult infusion 2 mcg/min (05/04/21 0900)   PRN:   Assessment: 25 yom presenting in respiratory distress. Heparin per pharmacy consult placed for chest pain/ACS.  Awaiting possible cath once renal function improving.    Heparin level is therapeutic at 0.37, on 1150 units/hr. Hgb 11.5, plt 125 (baseline 160). No s/sx of bleeding or infusion issues.  Goal of Therapy:  Heparin level 0.3-0.7 units/ml Monitor platelets by anticoagulation protocol: Yes   Plan:  Continue IV heparin to 1150  units/hr  Daily heparin level and CBC. F/u plans for cath lab eventually.  [place] Crane, Roylene Reason, BCCP Clinical Pharmacist  05/04/2021 10:04 AM   [facility] pharmacy phone numbers are listed on [email or web address]

## 2021-05-04 NOTE — Progress Notes (Signed)
Patient in no distress at this time. BiPAP not needed.

## 2021-05-04 NOTE — Evaluation (Signed)
Physical Therapy Evaluation/DC Note Patient Details Name: Ruben [name] MRN: [id number] DOB: [birth year] Today's Date: 05/04/2021  History of Present Illness  Pt adm 12/26 with late presenting MI and acute heart failure. PMH - HTN, CKD,arthritis, back surgery  Clinical Impression  Pt is mobilizing well and does not need skilled PT at this time. Pt does need to continue amb multiple times a day. Only needs someone to manage his lines or to liberate him enough from lines/tubes that he can amb on his own. Encouraged pt/family to be proactive in asking for pt to ambulate.        Recommendations for follow up therapy are one component of a multi-disciplinary discharge planning process, led by the attending physician.  Recommendations may be updated based on patient status, additional functional criteria and insurance authorization.  Follow Up Recommendations No PT follow up    Assistance Recommended at Discharge None  Functional Status Assessment Patient has not had a recent decline in their functional status  Equipment Recommendations  None recommended by PT    Recommendations for Other Services       Precautions / Restrictions Precautions Precautions: None      Mobility  Bed Mobility               General bed mobility comments: Pt sitting EOB    Transfers Overall transfer level: Modified independent Equipment used: None                    Ambulation/Gait Ambulation/Gait assistance: Supervision Gait Distance (Feet): 375 Feet Assistive device: None Gait Pattern/deviations: WFL(Within Functional Limits) Gait velocity: normal Gait velocity interpretation: >2.62 ft/sec, indicative of community ambulatory   General Gait Details: Steady gait. Supervision only for lines/tubes  Stairs            Wheelchair Mobility    Modified Rankin (Stroke Patients Only)       Balance Overall balance assessment: No apparent balance deficits (not formally  assessed)                                           Pertinent Vitals/Pain Pain Assessment: No/denies pain    Home Living Family/patient expects to be discharged to:: Private residence Living Arrangements: Spouse/significant other Available Help at Discharge: Family Type of Home: House Home Access: Stairs to enter Entrance Stairs-Rails: Right Entrance Stairs-Number of Steps: 2 or 4   Home Layout: One level Home Equipment: None      Prior Function Prior Level of Function : Independent/Modified Independent;Driving                     Hand Dominance        Extremity/Trunk Assessment   Upper Extremity Assessment Upper Extremity Assessment: Overall WFL for tasks assessed    Lower Extremity Assessment Lower Extremity Assessment: Overall WFL for tasks assessed    Cervical / Trunk Assessment Cervical / Trunk Assessment: Normal  Communication   Communication: No difficulties  Cognition Arousal/Alertness: Awake/alert Behavior During Therapy: WFL for tasks assessed/performed Overall Cognitive Status: Within Functional Limits for tasks assessed                                          General Comments General comments (skin integrity, edema, etc.): VSS on  RA    Exercises     Assessment/Plan    PT Assessment Patient does not need any further PT services  PT Problem List         PT Treatment Interventions      PT Goals (Current goals can be found in the Care Plan section)  Acute Rehab PT Goals PT Goal Formulation: All assessment and education complete, DC therapy    Frequency     Barriers to discharge        Co-evaluation               AM-PAC PT "6 Clicks" Mobility  Outcome Measure Help needed turning from your back to your side while in a flat bed without using bedrails?: None Help needed moving from lying on your back to sitting on the side of a flat bed without using bedrails?: None Help needed moving  to and from a bed to a chair (including a wheelchair)?: None Help needed standing up from a chair using your arms (e.g., wheelchair or bedside chair)?: None Help needed to walk in hospital room?: None Help needed climbing 3-5 steps with a railing? : None 6 Click Score: 24    End of Session   Activity Tolerance: Patient tolerated treatment well Patient left: in bed;with call bell/phone within reach;with nursing/sitter in room;with family/visitor present (sitting EOB) Nurse Communication: Mobility status PT Visit Diagnosis: Other abnormalities of gait and mobility (R26.89)    Time: 2440-1027 PT Time Calculation (min) (ACUTE ONLY): 22 min   Charges:   PT Evaluation $PT Eval Low Complexity: 1 Low          [place] Pager [phone] Office [place] 05/04/2021, 4:46 PM

## 2021-05-04 NOTE — Progress Notes (Signed)
Patient ID: Ruben [name], male   DOB: 1945/12/28, 75 y.o.   MRN: [id number]     Advanced Heart Failure Rounding Note  PCP-Cardiologist: Sanda Klein, MD   Subjective:    Creatinine 2.88 => 2.73 => 2.63.  He remains on milrinone 0.25 and NE 2, BP stable with co-ox 69%.   He remains in atrial fibrillation in 70s on po amiodarone.  On heparin gtt.   Plts low at 125 but have been chronically low pre-heparin.    No complaints, no dyspnea/chest pain.    Objective:   Weight Range: 82.3 kg Body mass index is 25.31 kg/m.   Vital Signs:   Temp:  [97.6 F (36.4 C)-99.3 F (37.4 C)] 98.1 F (36.7 C) (12/31 0700) Pulse Rate:  [76-100] 85 (12/31 0600) Resp:  [10-26] 21 (12/31 0600) BP: (87-131)/(43-94) 113/75 (12/31 0700) SpO2:  [92 %-100 %] 98 % (12/31 0600) Weight:  [82.3 kg] 82.3 kg (12/31 0500) Last BM Date: 05/03/21  Weight change: Filed Weights   05/02/21 0430 05/03/21 0407 05/04/21 0500  Weight: 82 kg 82.9 kg 82.3 kg    Intake/Output:   Intake/Output Summary (Last 24 hours) at 05/04/2021 0917 Last data filed at 05/04/2021 0700 Gross per 24 hour  Intake 1054.32 ml  Output 3750 ml  Net -2695.68 ml      Physical Exam    General: NAD Neck: No JVD, no thyromegaly or thyroid nodule.  Lungs: Clear to auscultation bilaterally with normal respiratory effort. CV: Nondisplaced PMI.  Heart irregular S1/S2, no S3/S4, 2/6 HSM apex.  No peripheral edema.   Abdomen: Soft, nontender, no hepatosplenomegaly, no distention.  Skin: Intact without lesions or rashes.  Neurologic: Alert and oriented x 3.  Psych: Normal affect. Extremities: No clubbing or cyanosis.  HEENT: Normal.    Telemetry   Atrial fibrillation 70s  Labs    CBC Recent Labs    05/03/21 0351 05/04/21 0330  WBC 7.2 5.9  HGB 11.8* 11.5*  HCT 35.1* 34.4*  MCV 95.6 95.3  PLT 114* 308*   Basic Metabolic Panel Recent Labs    05/03/21 0351 05/03/21 1026 05/04/21 0330  NA 136 134* 135  K  4.1  --  3.8  CL 103  --  101  CO2 25  --  25  GLUCOSE 118*  --  227*  BUN 60*  --  58*  CREATININE 2.73*  --  2.63*  CALCIUM 9.1  --  9.2  MG 2.1  --  2.0   Liver Function Tests Recent Labs    05/03/21 0351 05/04/21 0330  AST 35 25  ALT 49* 38  ALKPHOS 38 36*  BILITOT 1.2 1.0  PROT 5.8* 5.9*  ALBUMIN 3.0* 3.0*   No results for input(s): LIPASE, AMYLASE in the last 72 hours. Cardiac Enzymes No results for input(s): CKTOTAL, CKMB, CKMBINDEX, TROPONINI in the last 72 hours.  BNP: BNP (last 3 results) Recent Labs    04/29/21 1251  BNP 1,659.6*    ProBNP (last 3 results) No results for input(s): PROBNP in the last 8760 hours.   D-Dimer No results for input(s): DDIMER in the last 72 hours. Hemoglobin A1C No results for input(s): HGBA1C in the last 72 hours. Fasting Lipid Panel No results for input(s): CHOL, HDL, LDLCALC, TRIG, CHOLHDL, LDLDIRECT in the last 72 hours. Thyroid Function Tests No results for input(s): TSH, T4TOTAL, T3FREE, THYROIDAB in the last 72 hours.  Invalid input(s): FREET3  Other results:   Imaging    No  results found.   Medications:     Scheduled Medications:  amiodarone  400 mg Oral BID   aspirin EC  81 mg Oral Daily   atorvastatin  80 mg Oral Daily   Chlorhexidine Gluconate Cloth  6 each Topical Daily   folic acid  1 mg Oral Daily   mouth rinse  15 mL Mouth Rinse BID   multivitamin with minerals  1 tablet Oral Daily   polyethylene glycol  17 g Oral Daily   potassium chloride  20 mEq Oral Once   senna-docusate  1 tablet Oral BID   sodium chloride flush  10-40 mL Intracatheter Q12H   sodium chloride flush  3 mL Intravenous Q12H   thiamine  100 mg Oral Daily   Or   thiamine  100 mg Intravenous Daily    Infusions:  sodium chloride     heparin 1,150 Units/hr (05/04/21 0600)   milrinone 0.25 mcg/kg/min (05/04/21 0600)   norepinephrine (LEVOPHED) Adult infusion 2 mcg/min (05/04/21 0619)    PRN Medications: sodium  chloride, nitroGLYCERIN, sodium chloride flush, sodium chloride flush    Assessment/Plan   1. CAD: Suspect delayed presentation LCx (versus RCA) MI.  Echo with EF 35-40%, inferolateral and basal inferior akinesis, moderate-severe eccentric likely infarct-related MR, normal RV.  He was not cathed initially due to AKI and delayed presentation. No chest pain.  - Continue statin - Continue aspirin.  - Follow creatinine, will need RHC/LHC when improves.  Unlikely to benefit significantly from opening of the infarct-related artery at this point, but need to look for surgical disease.  - If no surgical disease, would add Plavix.  2. Acute systolic CHF/cardiogenic shock: Ischemic cardiomyopathy with infarct-related MR. Echo with EF 35-40%, inferolateral and basal inferior akinesis, moderate-severe eccentric likely infarct-related MR, normal RV.  Echo does not look bad enough to explain low output HF, concerned that MR may be playing a role.  CVP 7 today with co-ox 69% now on NE 2, milrinone 0.25. BP stable.  Creatinine slowly decreasing.  - No Lasix today.  - Continue current milrinone today as we await renal recovery.  - Can wean NE to off if BP remains stable.   3. Hyponatremia: Resolved.  4. Mitral regurgitation: Highly eccentric MR, suspect infarct-related with inferolateral/inferior WMAs.  May be playing a role in low output, probably not fully visualized by TTE (at least moderate-severe).  - Need full evaluation by TEE.  Would hold off for now and combine as TEE-DCCV in future (?after cath).  5. Atrial fibrillation: First noted this admission.  Rate is controlled, now on po amiodarone.  - Continue heparin gtt.  - Continue po amiodarone.  - If he does not convert on his own, will need TEE-guided DCCV.  Likely after cath if creatinine improves and weaning of pressor/inotrope.  6. AKI on CKD 3: Baseline creatinine around 1.4. Up to 3.09, now coming down 2.63.   - Continue to support cardiac  output. 7. Active smoker: Needs to quit.  8. ETOH abuse: Needs to cut back.  9. Thrombocytopenia: Mild, pre-dates heparin initiation.   CRITICAL CARE Performed by: Loralie Champagne  Total critical care time: 35 minutes  Critical care time was exclusive of separately billable procedures and treating other patients.  Critical care was necessary to treat or prevent imminent or life-threatening deterioration.  Critical care was time spent personally by me on the following activities: development of treatment plan with patient and/or surrogate as well as nursing, discussions with consultants, evaluation of patient's  response to treatment, examination of patient, obtaining history from patient or surrogate, ordering and performing treatments and interventions, ordering and review of laboratory studies, ordering and review of radiographic studies, pulse oximetry and re-evaluation of patient's condition.   Length of Stay: [place], MD  05/04/2021, 9:17 AM  Advanced Heart Failure Team Pager [phone] (M-F; 7a - 5p)  Please contact [facility] for night-coverage after hours (5p -7a ) and weekends on [email or web address]

## 2021-05-05 DIAGNOSIS — I5043 Acute on chronic combined systolic (congestive) and diastolic (congestive) heart failure: Secondary | ICD-10-CM

## 2021-05-05 LAB — COMPREHENSIVE METABOLIC PANEL
ALT: 37 U/L (ref 0–44)
AST: 25 U/L (ref 15–41)
Albumin: 3.2 g/dL — ABNORMAL LOW (ref 3.5–5.0)
Alkaline Phosphatase: 43 U/L (ref 38–126)
Anion gap: 9 (ref 5–15)
BUN: 50 mg/dL — ABNORMAL HIGH (ref 8–23)
CO2: 26 mmol/L (ref 22–32)
Calcium: 9.6 mg/dL (ref 8.9–10.3)
Chloride: 103 mmol/L (ref 98–111)
Creatinine, Ser: 2.49 mg/dL — ABNORMAL HIGH (ref 0.61–1.24)
GFR, Estimated: 26 mL/min — ABNORMAL LOW (ref >60)
Glucose, Bld: 144 mg/dL — ABNORMAL HIGH (ref 70–99)
Potassium: 4.6 mmol/L (ref 3.5–5.1)
Sodium: 138 mmol/L (ref 135–145)
Total Bilirubin: 1 mg/dL (ref 0.3–1.2)
Total Protein: 6.2 g/dL — ABNORMAL LOW (ref 6.5–8.1)

## 2021-05-05 LAB — CBC
HCT: 37.3 % — ABNORMAL LOW (ref 39.0–52.0)
Hemoglobin: 12.8 g/dL — ABNORMAL LOW (ref 13.0–17.0)
MCH: 32.7 pg (ref 26.0–34.0)
MCHC: 34.3 g/dL (ref 30.0–36.0)
MCV: 95.4 fL (ref 80.0–100.0)
Platelets: 142 10*3/uL — ABNORMAL LOW (ref 150–400)
RBC: 3.91 MIL/uL — ABNORMAL LOW (ref 4.22–5.81)
RDW: 13.9 % (ref 11.5–15.5)
WBC: 6.3 10*3/uL (ref 4.0–10.5)
nRBC: 0 % (ref 0.0–0.2)

## 2021-05-05 LAB — COOXEMETRY PANEL
Carboxyhemoglobin: 0.8 % (ref 0.5–1.5)
Methemoglobin: 0.7 % (ref 0.0–1.5)
O2 Saturation: 60.9 %
Total hemoglobin: 13.1 g/dL (ref 12.0–16.0)

## 2021-05-05 LAB — HEPARIN LEVEL (UNFRACTIONATED): Heparin Unfractionated: 0.47 IU/mL (ref 0.30–0.70)

## 2021-05-05 NOTE — Progress Notes (Signed)
Patient ID: Ruben [name], male   DOB: [birth year], 76 y.o.   MRN: [id number]     Advanced Heart Failure Rounding Note  PCP-Cardiologist: Sanda Klein, MD   Subjective:    Creatinine 2.88 => 2.73 => 2.63 => 2.49.  He remains on milrinone 0.25, now off NE, BP stable with co-ox 61%.  CVP remains 7.   He is in atrial fibrillation in 70s on po amiodarone.  On heparin gtt.   Plts low at 148 but have been chronically low pre-heparin.    No complaints, no dyspnea/chest pain. Walked in hall yesterday.    Objective:   Weight Range: 79.7 kg Body mass index is 24.51 kg/m.   Vital Signs:   Temp:  [98.2 F (36.8 C)-98.7 F (37.1 C)] 98.4 F (36.9 C) (01/01 0747) Pulse Rate:  [71-109] 99 (01/01 0700) Resp:  [2-28] 18 (01/01 0700) BP: (79-121)/(45-97) 102/82 (01/01 0700) SpO2:  [79 %-100 %] 98 % (01/01 0700) Weight:  [79.7 kg] 79.7 kg (01/01 0500) Last BM Date: 05/03/21  Weight change: Filed Weights   05/03/21 0407 05/04/21 0500 05/05/21 0500  Weight: 82.9 kg 82.3 kg 79.7 kg    Intake/Output:   Intake/Output Summary (Last 24 hours) at 05/05/2021 0927 Last data filed at 05/05/2021 0700 Gross per 24 hour  Intake 510.96 ml  Output 725 ml  Net -214.04 ml      Physical Exam    General: NAD Neck: No JVD, no thyromegaly or thyroid nodule.  Lungs: Clear to auscultation bilaterally with normal respiratory effort. CV: Nondisplaced PMI.  Heart regular S1/S2, no S3/S4, 2/6 HSM apex.  No peripheral edema.   Abdomen: Soft, nontender, no hepatosplenomegaly, no distention.  Skin: Intact without lesions or rashes.  Neurologic: Alert and oriented x 3.  Psych: Normal affect. Extremities: No clubbing or cyanosis.  HEENT: Normal.   Telemetry   Atrial fibrillation 70s (personally reviewed)  Labs    CBC Recent Labs    05/04/21 0330 05/05/21 0505  WBC 5.9 6.3  HGB 11.5* 12.8*  HCT 34.4* 37.3*  MCV 95.3 95.4  PLT 125* 932*   Basic Metabolic Panel Recent Labs     05/03/21 0351 05/03/21 1026 05/04/21 0330 05/05/21 0505  NA 136   < > 135 138  K 4.1  --  3.8 4.6  CL 103  --  101 103  CO2 25  --  25 26  GLUCOSE 118*  --  227* 144*  BUN 60*  --  58* 50*  CREATININE 2.73*  --  2.63* 2.49*  CALCIUM 9.1  --  9.2 9.6  MG 2.1  --  2.0  --    < > = values in this interval not displayed.   Liver Function Tests Recent Labs    05/04/21 0330 05/05/21 0505  AST 25 25  ALT 38 37  ALKPHOS 36* 43  BILITOT 1.0 1.0  PROT 5.9* 6.2*  ALBUMIN 3.0* 3.2*   No results for input(s): LIPASE, AMYLASE in the last 72 hours. Cardiac Enzymes No results for input(s): CKTOTAL, CKMB, CKMBINDEX, TROPONINI in the last 72 hours.  BNP: BNP (last 3 results) Recent Labs    04/29/21 1251  BNP 1,659.6*    ProBNP (last 3 results) No results for input(s): PROBNP in the last 8760 hours.   D-Dimer No results for input(s): DDIMER in the last 72 hours. Hemoglobin A1C No results for input(s): HGBA1C in the last 72 hours. Fasting Lipid Panel No results for input(s): CHOL, HDL, LDLCALC,  TRIG, CHOLHDL, LDLDIRECT in the last 72 hours. Thyroid Function Tests No results for input(s): TSH, T4TOTAL, T3FREE, THYROIDAB in the last 72 hours.  Invalid input(s): FREET3  Other results:   Imaging    No results found.   Medications:     Scheduled Medications:  amiodarone  400 mg Oral BID   aspirin EC  81 mg Oral Daily   atorvastatin  80 mg Oral Daily   Chlorhexidine Gluconate Cloth  6 each Topical Daily   folic acid  1 mg Oral Daily   mouth rinse  15 mL Mouth Rinse BID   multivitamin with minerals  1 tablet Oral Daily   polyethylene glycol  17 g Oral Daily   senna-docusate  1 tablet Oral BID   sodium chloride flush  10-40 mL Intracatheter Q12H   sodium chloride flush  3 mL Intravenous Q12H   thiamine  100 mg Oral Daily   Or   thiamine  100 mg Intravenous Daily    Infusions:  sodium chloride     heparin 1,150 Units/hr (05/05/21 0700)   milrinone 0.25  mcg/kg/min (05/05/21 0700)   norepinephrine (LEVOPHED) Adult infusion Stopped (05/04/21 1033)    PRN Medications: sodium chloride, nitroGLYCERIN, sodium chloride flush, sodium chloride flush    Assessment/Plan   1. CAD: Suspect delayed presentation LCx (versus RCA) MI.  Echo with EF 35-40%, inferolateral and basal inferior akinesis, moderate-severe eccentric likely infarct-related MR, normal RV.  He was not cathed initially due to AKI and delayed presentation. No chest pain.  - Continue statin - Continue aspirin.  - Follow creatinine, will need RHC/LHC when improves.  Unlikely to benefit significantly from opening of the infarct-related artery at this point, but need to look for surgical disease.  - If no surgical disease, would add Plavix.  2. Acute systolic CHF/cardiogenic shock: Ischemic cardiomyopathy with infarct-related MR. Echo with EF 35-40%, inferolateral and basal inferior akinesis, moderate-severe eccentric likely infarct-related MR, normal RV.  Echo does not look bad enough to explain low output HF, concerned that MR may be playing a role.  CVP 7 today with co-ox 61% now on milrinone 0.25 and off NE. BP stable.  Creatinine slowly decreasing, 2.49 today.  - No Lasix today.  - Continue current milrinone today as we await renal recovery.    3. Hyponatremia: Resolved.  4. Mitral regurgitation: Highly eccentric MR, suspect infarct-related with inferolateral/inferior WMAs.  May be playing a role in low output, probably not fully visualized by TTE (at least moderate-severe).  - Need full evaluation by TEE.  Would hold off for now and combine as TEE-DCCV in future (?after cath).  5. Atrial fibrillation: First noted this admission.  Rate is controlled, now on po amiodarone.  - Continue heparin gtt.  - Continue po amiodarone.  - If he does not convert on his own, will need TEE-guided DCCV.  Likely after cath if creatinine improves and weaning of pressor/inotrope.  6. AKI on CKD 3:  Baseline creatinine around 1.4. Up to 3.09, now coming down 2.49.   - Continue to support cardiac output. 7. Active smoker: Needs to quit.  8. ETOH abuse: Needs to cut back.  9. Thrombocytopenia: Mild, pre-dates heparin initiation.   Length of Stay: 6  Loralie Champagne, MD  05/05/2021, 9:27 AM  Advanced Heart Failure Team Pager [phone] (M-F; 7a - 5p)  Please contact [facility] for night-coverage after hours (5p -7a ) and weekends on [email or web address]

## 2021-05-05 NOTE — Progress Notes (Signed)
ANTICOAGULATION CONSULT NOTE  Pharmacy Consult for Heparin Indication: chest pain/ACS  Allergies  Allergen Reactions   Other Other (See Comments)    Pollen - unknown    Patient Measurements: Height: 5\' 11"  (180.3 cm) Weight: 79.7 kg (175 lb 11.3 oz) IBW/kg (Calculated) : 75.3 Heparin Dosing Weight: 81.6 kg  Vital Signs: Temp: 98.4 F (36.9 C) (01/01 0747) Temp Source: Oral (01/01 0747) BP: 102/82 (01/01 0700) Pulse Rate: 99 (01/01 0700)  Labs: Recent Labs    05/03/21 0351 05/04/21 0330 05/05/21 0505  HGB 11.8* 11.5* 12.8*  HCT 35.1* 34.4* 37.3*  PLT 114* 125* 142*  HEPARINUNFRC 0.37 0.37 0.47  CREATININE 2.73* 2.63* 2.49*     Estimated Creatinine Clearance: 27.3 mL/min (A) (by C-G formula based on SCr of 2.49 mg/dL (H)).   Medical History: Past Medical History:  Diagnosis Date   Arthritis    Chronic kidney disease, stage 3b ([place])    ETOH abuse    History of stress test 09/02/2008   showed inferolateral scar without ischemia   Hx of echocardiogram 09/02/2008   was essentially normal   Hyperlipidemia    Hypertension    Tobacco abuse     Medications:   Infusions:   sodium chloride     heparin 1,150 Units/hr (05/05/21 0700)   milrinone 0.25 mcg/kg/min (05/05/21 0700)   norepinephrine (LEVOPHED) Adult infusion Stopped (05/04/21 1033)    Assessment: Ruben [name] presenting in respiratory distress. Heparin per pharmacy consult placed for chest pain/ACS.  Awaiting possible cath once renal function improving.    Heparin level is therapeutic at 0.47, on 1150 units/hr. Hgb 12.8, plt 142 (baseline 160). No s/sx of bleeding or infusion issues.  Goal of Therapy:  Heparin level 0.3-0.7 units/ml Monitor platelets by anticoagulation protocol: Yes   Plan:  Continue IV heparin to 1150 units/hr  Daily heparin level and CBC. F/u plans for cath lab eventually.  [place] Crane, Vena [place], BCPS, BCCP Clinical Pharmacist  05/05/2021 11:22 AM   [facility] pharmacy phone  numbers are listed on [email or web address]

## 2021-05-05 NOTE — Plan of Care (Signed)

## 2021-05-06 DIAGNOSIS — I5043 Acute on chronic combined systolic (congestive) and diastolic (congestive) heart failure: Secondary | ICD-10-CM | POA: Diagnosis not present

## 2021-05-06 LAB — CBC
HCT: 35.2 % — ABNORMAL LOW (ref 39.0–52.0)
Hemoglobin: 11.7 g/dL — ABNORMAL LOW (ref 13.0–17.0)
MCH: 32.1 pg (ref 26.0–34.0)
MCHC: 33.2 g/dL (ref 30.0–36.0)
MCV: 96.7 fL (ref 80.0–100.0)
Platelets: 136 10*3/uL — ABNORMAL LOW (ref 150–400)
RBC: 3.64 MIL/uL — ABNORMAL LOW (ref 4.22–5.81)
RDW: 13.9 % (ref 11.5–15.5)
WBC: 5.8 10*3/uL (ref 4.0–10.5)
nRBC: 0 % (ref 0.0–0.2)

## 2021-05-06 LAB — COMPREHENSIVE METABOLIC PANEL
ALT: 35 U/L (ref 0–44)
AST: 27 U/L (ref 15–41)
Albumin: 3 g/dL — ABNORMAL LOW (ref 3.5–5.0)
Alkaline Phosphatase: 41 U/L (ref 38–126)
Anion gap: 6 (ref 5–15)
BUN: 42 mg/dL — ABNORMAL HIGH (ref 8–23)
CO2: 26 mmol/L (ref 22–32)
Calcium: 9.1 mg/dL (ref 8.9–10.3)
Chloride: 103 mmol/L (ref 98–111)
Creatinine, Ser: 2.3 mg/dL — ABNORMAL HIGH (ref 0.61–1.24)
GFR, Estimated: 29 mL/min — ABNORMAL LOW (ref >60)
Glucose, Bld: 139 mg/dL — ABNORMAL HIGH (ref 70–99)
Potassium: 4.7 mmol/L (ref 3.5–5.1)
Sodium: 135 mmol/L (ref 135–145)
Total Bilirubin: 0.8 mg/dL (ref 0.3–1.2)
Total Protein: 6 g/dL — ABNORMAL LOW (ref 6.5–8.1)

## 2021-05-06 LAB — CULTURE, BLOOD (ROUTINE X 2)
Culture: NO GROWTH
Culture: NO GROWTH
Special Requests: ADEQUATE

## 2021-05-06 LAB — COOXEMETRY PANEL
Carboxyhemoglobin: 0.9 % (ref 0.5–1.5)
Methemoglobin: 0.9 % (ref 0.0–1.5)
O2 Saturation: 71.6 %
Total hemoglobin: 11.9 g/dL — ABNORMAL LOW (ref 12.0–16.0)

## 2021-05-06 LAB — HEPARIN LEVEL (UNFRACTIONATED): Heparin Unfractionated: 0.47 IU/mL (ref 0.30–0.70)

## 2021-05-06 MED ORDER — FUROSEMIDE 10 MG/ML IJ SOLN
40.0000 mg | Freq: Once | INTRAMUSCULAR | Status: AC
Start: 2021-05-06 — End: 2021-05-06
  Administered 2021-05-06: 40 mg via INTRAVENOUS
  Filled 2021-05-06: qty 4

## 2021-05-06 NOTE — Progress Notes (Addendum)
Patient ID: Ruben [name], male   DOB: 1946-02-01, 76 y.o.   MRN: [id number]     Advanced Heart Failure Rounding Note  PCP-Cardiologist: Sanda Klein, MD   Subjective:    Remains on milrinone 0.25. Co-ox 72%. CVP 8   Creatinine improving 2.88 => 2.73 => 2.63 => 2.49 =>2.30.   Continues w/ Afib, rate controlled 80s-90s.   Plts low at 136 but have been chronically low pre-heparin. No bleeding.   No complaints today. Denies dyspnea. No CP. Family present at bedside.    Objective:   Weight Range: 79.7 kg Body mass index is 24.51 kg/m.   Vital Signs:   Temp:  [97.8 F (36.6 C)-98.3 F (36.8 C)] 97.8 F (36.6 C) (01/01 2300) Pulse Rate:  [78-95] 94 (01/02 0700) Resp:  [11-37] 25 (01/02 0700) BP: (95-150)/(55-102) 102/68 (01/02 0700) SpO2:  [87 %-100 %] 97 % (01/02 0700) FiO2 (%):  [30 %-35 %] 30 % (01/02 0356) Last BM Date: 05/05/21  Weight change: Filed Weights   05/03/21 0407 05/04/21 0500 05/05/21 0500  Weight: 82.9 kg 82.3 kg 79.7 kg    Intake/Output:   Intake/Output Summary (Last 24 hours) at 05/06/2021 0804 Last data filed at 05/06/2021 0700 Gross per 24 hour  Intake 2101.37 ml  Output 925 ml  Net 1176.37 ml      Physical Exam    CVP 8  General:  Well appearing. No respiratory difficulty HEENT: normal Neck: supple. JVD 8 cm. Carotids 2+ bilat; no bruits. No lymphadenopathy or thyromegaly appreciated. Cor: PMI nondisplaced. Irregularly irregular rhythm. No rubs, gallops or murmurs. Lungs: clear Abdomen: soft, nontender, nondistended. No hepatosplenomegaly. No bruits or masses. Good bowel sounds. Extremities: no cyanosis, clubbing, rash, edema Neuro: alert & oriented x 3, cranial nerves grossly intact. moves all 4 extremities w/o difficulty. Affect pleasant.   Telemetry   Atrial fibrillation 80s (personally reviewed)  Labs    CBC Recent Labs    05/05/21 0505 05/06/21 0457  WBC 6.3 5.8  HGB 12.8* 11.7*  HCT 37.3* 35.2*  MCV 95.4 96.7   PLT 142* 765*   Basic Metabolic Panel Recent Labs    05/04/21 0330 05/05/21 0505 05/06/21 0457  NA 135 138 135  K 3.8 4.6 4.7  CL 101 103 103  CO2 25 26 26   GLUCOSE 227* 144* 139*  BUN 58* 50* 42*  CREATININE 2.63* 2.49* 2.30*  CALCIUM 9.2 9.6 9.1  MG 2.0  --   --    Liver Function Tests Recent Labs    05/05/21 0505 05/06/21 0457  AST 25 27  ALT 37 35  ALKPHOS 43 41  BILITOT 1.0 0.8  PROT 6.2* 6.0*  ALBUMIN 3.2* 3.0*   No results for input(s): LIPASE, AMYLASE in the last 72 hours. Cardiac Enzymes No results for input(s): CKTOTAL, CKMB, CKMBINDEX, TROPONINI in the last 72 hours.  BNP: BNP (last 3 results) Recent Labs    04/29/21 1251  BNP 1,659.6*    ProBNP (last 3 results) No results for input(s): PROBNP in the last 8760 hours.   D-Dimer No results for input(s): DDIMER in the last 72 hours. Hemoglobin A1C No results for input(s): HGBA1C in the last 72 hours. Fasting Lipid Panel No results for input(s): CHOL, HDL, LDLCALC, TRIG, CHOLHDL, LDLDIRECT in the last 72 hours. Thyroid Function Tests No results for input(s): TSH, T4TOTAL, T3FREE, THYROIDAB in the last 72 hours.  Invalid input(s): FREET3  Other results:   Imaging    No results found.  Medications:     Scheduled Medications:  amiodarone  400 mg Oral BID   aspirin EC  81 mg Oral Daily   atorvastatin  80 mg Oral Daily   Chlorhexidine Gluconate Cloth  6 each Topical Daily   folic acid  1 mg Oral Daily   mouth rinse  15 mL Mouth Rinse BID   multivitamin with minerals  1 tablet Oral Daily   polyethylene glycol  17 g Oral Daily   senna-docusate  1 tablet Oral BID   sodium chloride flush  10-40 mL Intracatheter Q12H   sodium chloride flush  3 mL Intravenous Q12H   thiamine  100 mg Oral Daily   Or   thiamine  100 mg Intravenous Daily    Infusions:  sodium chloride     heparin 1,150 Units/hr (05/06/21 0700)   milrinone 0.25 mcg/kg/min (05/06/21 0700)   norepinephrine (LEVOPHED)  Adult infusion Stopped (05/04/21 1033)    PRN Medications: sodium chloride, nitroGLYCERIN, sodium chloride flush, sodium chloride flush    Assessment/Plan   1. CAD: Suspect delayed presentation LCx (versus RCA) MI.  Echo with EF 35-40%, inferolateral and basal inferior akinesis, moderate-severe eccentric likely infarct-related MR, normal RV.  He was not cathed initially due to AKI and delayed presentation. No chest pain.  - Continue statin - Continue aspirin  - Follow creatinine, will need RHC/LHC when improves.  Unlikely to benefit significantly from opening of the infarct-related artery at this point, but need to look for surgical disease.  - If no surgical disease, would add Plavix.  2. Acute systolic CHF/cardiogenic shock: Ischemic cardiomyopathy with infarct-related MR. Echo with EF 35-40%, inferolateral and basal inferior akinesis, moderate-severe eccentric likely infarct-related MR, normal RV.  Echo does not look bad enough to explain low output HF, concerned that MR may be playing a role.  CVP 8 today with co-ox 72% now on milrinone 0.25 and off NE. BP stable.  Creatinine slowly decreasing, 2.30 today.  - No Lasix today.  - Continue current milrinone today as we await renal recovery.    3. Hyponatremia: Resolved.  4. Mitral regurgitation: Highly eccentric MR, suspect infarct-related with inferolateral/inferior WMAs.  May be playing a role in low output, probably not fully visualized by TTE (at least moderate-severe).  - Need full evaluation by TEE.  Would hold off for now and combine as TEE-DCCV in future (?after cath).  5. Atrial fibrillation: First noted this admission.  Rate is controlled, now on po amiodarone.  - Continue heparin gtt.  - Continue po amiodarone.  - If he does not convert on his own, will need TEE-guided DCCV.  Likely after cath if creatinine improves and weaning of pressor/inotrope.  6. AKI on CKD 3: Baseline creatinine around 1.4. Up to 3.09, now coming down 2.30  today.   - Continue to support cardiac output. 7. Active smoker: Needs to quit.  8. ETOH abuse: Needs to cut back.  9. Thrombocytopenia: Mild, pre-dates heparin initiation.   Length of Stay: [address], PA-C  05/06/2021, 8:04 AM  Advanced Heart Failure Team Pager [phone] (M-F; 7a - 5p)  Please contact [facility] for night-coverage after hours (5p -7a ) and weekends on [email or web address]  Patient seen with PA, agree with the above note.   CVP 9-10 today with co-ox 72% on milrinone 0.25.  No complaints this morning, walked in hall.   General: NAD Neck: JVP 8-9 cm, no thyromegaly or thyroid nodule.  Lungs: Clear to auscultation bilaterally with normal respiratory effort.  CV: Nondisplaced PMI.  Heart regular S1/S2, no S3/S4, 2/6 HSM apex.  No peripheral edema.   Abdomen: Soft, nontender, no hepatosplenomegaly, no distention.  Skin: Intact without lesions or rashes.  Neurologic: Alert and oriented x 3.  Psych: Normal affect. Extremities: No clubbing or cyanosis.  HEENT: Normal.   Creatinine slowly coming down.  Would like to cath eventually, keep observing while down-trending.  Continue milrinone 0.25 with good co-ox.   CVP mildly higher, will give Lasix 40 mg IV x 1.   He remains in atrial fibrillation on heparin gtt and amiodarone gtt.  Has chronically mildly low plts, stable today.   Ideally, plan for LHC/RHC followed by TEE-guided cardioversion to get him back in NSR and assess mitral regurgitation.   Loralie Champagne 05/06/2021 9:08 AM

## 2021-05-06 NOTE — TOC CM/SW Note (Signed)
HF TOC CM spoke to pt and wife/dtr at bedside. Pt was independent prior to hospital stay. No previous HH or DME. Will continue to follow for dc needs. [place], Heart Failure TOC CM [phone]

## 2021-05-06 NOTE — Progress Notes (Signed)
ANTICOAGULATION CONSULT NOTE  Pharmacy Consult for Heparin Indication: chest pain/ACS  Allergies  Allergen Reactions   Other Other (See Comments)    Pollen - unknown    Patient Measurements: Height: 5\' 11"  (180.3 cm) Weight: 79.7 kg (175 lb 11.3 oz) IBW/kg (Calculated) : 75.3 Heparin Dosing Weight: 81.6 kg  Vital Signs: Temp: 98.5 F (36.9 C) (01/02 0806) Temp Source: Oral (01/02 0806) BP: 102/68 (01/02 0700) Pulse Rate: 94 (01/02 0700)  Labs: Recent Labs    05/04/21 0330 05/05/21 0505 05/06/21 0457  HGB 11.5* 12.8* 11.7*  HCT 34.4* 37.3* 35.2*  PLT 125* 142* 136*  HEPARINUNFRC 0.37 0.47 0.47  CREATININE 2.63* 2.49* 2.30*     Estimated Creatinine Clearance: 29.6 mL/min (A) (by C-G formula based on SCr of 2.3 mg/dL (H)).   Medical History: Past Medical History:  Diagnosis Date   Arthritis    Chronic kidney disease, stage 3b ([place])    ETOH abuse    History of stress test 09/02/2008   showed inferolateral scar without ischemia   Hx of echocardiogram 09/02/2008   was essentially normal   Hyperlipidemia    Hypertension    Tobacco abuse     Medications:   Infusions:   sodium chloride     heparin 1,150 Units/hr (05/06/21 0700)   milrinone 0.25 mcg/kg/min (05/06/21 0700)    Assessment: 49 yoM presenting in respiratory distress. Heparin per pharmacy consult placed for chest pain/ACS, pt also with new onset AF.  Heparin level is therapeutic, CBC stable.  Goal of Therapy:  Heparin level 0.3-0.7 units/ml Monitor platelets by anticoagulation protocol: Yes   Plan:  Continue IV heparin to 1150 units/hr  Daily heparin level and CBC  Arrie Senate, PharmD, [place], [facility] Clinical Pharmacist [phone] Please check AMION for all [facility] numbers 05/06/2021

## 2021-05-07 ENCOUNTER — Other Ambulatory Visit (HOSPITAL_COMMUNITY): Payer: Self-pay

## 2021-05-07 ENCOUNTER — Encounter (HOSPITAL_COMMUNITY): Payer: Self-pay | Admitting: Cardiovascular Disease

## 2021-05-07 ENCOUNTER — Inpatient Hospital Stay (HOSPITAL_COMMUNITY): Payer: Medicare Other | Admitting: Anesthesiology

## 2021-05-07 ENCOUNTER — Inpatient Hospital Stay (HOSPITAL_COMMUNITY): Payer: Medicare Other

## 2021-05-07 ENCOUNTER — Encounter (HOSPITAL_COMMUNITY)
Admission: EM | Disposition: A | Discharge status: Home / Self Care | Payer: Self-pay | Source: Ambulatory Visit | Attending: Cardiovascular Disease

## 2021-05-07 DIAGNOSIS — I340 Nonrheumatic mitral (valve) insufficiency: Secondary | ICD-10-CM | POA: Diagnosis not present

## 2021-05-07 DIAGNOSIS — R570 Cardiogenic shock: Secondary | ICD-10-CM

## 2021-05-07 HISTORY — PX: TEE WITHOUT CARDIOVERSION: SHX5443

## 2021-05-07 LAB — CBC
HCT: 36.9 % — ABNORMAL LOW (ref 39.0–52.0)
Hemoglobin: 12.1 g/dL — ABNORMAL LOW (ref 13.0–17.0)
MCH: 31.6 pg (ref 26.0–34.0)
MCHC: 32.8 g/dL (ref 30.0–36.0)
MCV: 96.3 fL (ref 80.0–100.0)
Platelets: 158 10*3/uL (ref 150–400)
RBC: 3.83 MIL/uL — ABNORMAL LOW (ref 4.22–5.81)
RDW: 13.7 % (ref 11.5–15.5)
WBC: 6.7 10*3/uL (ref 4.0–10.5)
nRBC: 0 % (ref 0.0–0.2)

## 2021-05-07 LAB — BASIC METABOLIC PANEL
Anion gap: 9 (ref 5–15)
BUN: 37 mg/dL — ABNORMAL HIGH (ref 8–23)
CO2: 26 mmol/L (ref 22–32)
Calcium: 9.1 mg/dL (ref 8.9–10.3)
Chloride: 100 mmol/L (ref 98–111)
Creatinine, Ser: 2.28 mg/dL — ABNORMAL HIGH (ref 0.61–1.24)
GFR, Estimated: 29 mL/min — ABNORMAL LOW (ref >60)
Glucose, Bld: 146 mg/dL — ABNORMAL HIGH (ref 70–99)
Potassium: 4.3 mmol/L (ref 3.5–5.1)
Sodium: 135 mmol/L (ref 135–145)

## 2021-05-07 LAB — COOXEMETRY PANEL
Carboxyhemoglobin: 0.9 % (ref 0.5–1.5)
Methemoglobin: 0.7 % (ref 0.0–1.5)
O2 Saturation: 73.9 %
Total hemoglobin: 12.6 g/dL (ref 12.0–16.0)

## 2021-05-07 LAB — ECHO TEE
MV M vel: 3.73 m/s
MV Peak grad: 55.5 mmHg
Radius: 0.67 cm

## 2021-05-07 LAB — HEPARIN LEVEL (UNFRACTIONATED): Heparin Unfractionated: 0.47 IU/mL (ref 0.30–0.70)

## 2021-05-07 SURGERY — ECHOCARDIOGRAM, TRANSESOPHAGEAL
Anesthesia: Monitor Anesthesia Care

## 2021-05-07 MED ORDER — PROPOFOL 500 MG/50ML IV EMUL
INTRAVENOUS | Status: DC | PRN
  Administered 2021-05-07: 100 ug/kg/min via INTRAVENOUS

## 2021-05-07 MED ORDER — PROPOFOL 10 MG/ML IV BOLUS
INTRAVENOUS | Status: DC | PRN
  Administered 2021-05-07: 10 mg via INTRAVENOUS
  Administered 2021-05-07: 20 mg via INTRAVENOUS
  Administered 2021-05-07 (×2): 10 mg via INTRAVENOUS

## 2021-05-07 MED ORDER — BUTAMBEN-TETRACAINE-BENZOCAINE 2-2-14 % EX AERO
INHALATION_SPRAY | CUTANEOUS | Status: DC | PRN
  Administered 2021-05-07: 2 via TOPICAL

## 2021-05-07 MED ORDER — ONDANSETRON HCL 4 MG/2ML IJ SOLN
4.0000 mg | Freq: Three times a day (TID) | INTRAMUSCULAR | Status: DC | PRN
  Administered 2021-05-07 – 2021-05-08 (×2): 4 mg via INTRAVENOUS
  Filled 2021-05-07 (×2): qty 2

## 2021-05-07 MED ORDER — SODIUM CHLORIDE 0.45 % IV SOLN: INTRAVENOUS | Status: DC

## 2021-05-07 MED ORDER — LIDOCAINE HCL (CARDIAC) PF 100 MG/5ML IV SOSY
PREFILLED_SYRINGE | INTRAVENOUS | Status: DC | PRN
  Administered 2021-05-07: 80 mg via INTRAVENOUS

## 2021-05-07 NOTE — Progress Notes (Signed)
Patient not ready for BiPAP at this time. Will check again at later time. RN aware.

## 2021-05-07 NOTE — Progress Notes (Signed)
ANTICOAGULATION CONSULT NOTE  Pharmacy Consult for Heparin Indication: chest pain/ACS  Allergies  Allergen Reactions   Other Other (See Comments)    Pollen - unknown    Patient Measurements: Height: 5\' 11"  (180.3 cm) Weight: 81.8 kg (180 lb 5.4 oz) IBW/kg (Calculated) : 75.3 Heparin Dosing Weight: 81.6 kg  Vital Signs: Temp: 98.1 F (36.7 C) (01/02 1958) Temp Source: Oral (01/02 1958) BP: 118/66 (01/03 0600) Pulse Rate: 87 (01/03 0600)  Labs: Recent Labs    05/05/21 0505 05/06/21 0457 05/07/21 0331  HGB 12.8* 11.7* 12.1*  HCT 37.3* 35.2* 36.9*  PLT 142* 136* 158  HEPARINUNFRC 0.47 0.47 0.47  CREATININE 2.49* 2.30* 2.28*     Estimated Creatinine Clearance: 29.8 mL/min (A) (by C-G formula based on SCr of 2.28 mg/dL (H)).   Medical History: Past Medical History:  Diagnosis Date   Arthritis    Chronic kidney disease, stage 3b ([place])    ETOH abuse    History of stress test 09/02/2008   showed inferolateral scar without ischemia   Hx of echocardiogram 09/02/2008   was essentially normal   Hyperlipidemia    Hypertension    Tobacco abuse     Medications:   Infusions:   sodium chloride     heparin 1,150 Units/hr (05/07/21 0600)   milrinone 0.25 mcg/kg/min (05/07/21 0600)    Assessment: 69 yoM presenting in respiratory distress. Heparin per pharmacy consult placed for chest pain/ACS, pt also with new onset AF.  Heparin level is therapeutic at 0.47, CBC stable.  Goal of Therapy:  Heparin level 0.3-0.7 units/ml Monitor platelets by anticoagulation protocol: Yes   Plan:  Continue IV heparin to 1150 units/hr  Daily heparin level and CBC   Arrie Senate, PharmD, [place], [facility] Clinical Pharmacist [phone] Please check AMION for all [facility] numbers 05/07/2021

## 2021-05-07 NOTE — Transfer of Care (Signed)
Immediate Anesthesia Transfer of Care Note  Patient: Ruben [name]  Procedure(s) Performed: TRANSESOPHAGEAL ECHOCARDIOGRAM (TEE)  Patient Location: PACU and Endoscopy Unit  Anesthesia Type:MAC  Level of Consciousness: drowsy  Airway & Oxygen Therapy: Patient Spontanous Breathing  Post-op Assessment: Report given to RN and Post -op Vital signs reviewed and stable  Post vital signs: Reviewed and stable  Last Vitals:  Vitals Value Taken Time  BP 111/66 05/07/21 1428  Temp    Pulse 93 05/07/21 1430  Resp 20 05/07/21 1430  SpO2 95 % 05/07/21 1430  Vitals shown include unvalidated device data.  Last Pain:  Vitals:   05/07/21 1323  TempSrc: Temporal  PainSc: 0-No pain      Patients Stated Pain Goal: 0 (04/59/97 7414)  Complications: No notable events documented.

## 2021-05-07 NOTE — Progress Notes (Signed)
°  Echocardiogram Echocardiogram Transesophageal has been performed.  Ruben [name] 05/07/2021, 2:46 PM

## 2021-05-07 NOTE — Interval H&P Note (Signed)
History and Physical Interval Note:  05/07/2021 1:43 PM  Ruben [name]  has presented today for surgery, with the diagnosis of mitral regurgitation.  The various methods of treatment have been discussed with the patient and family. After consideration of risks, benefits and other options for treatment, the patient has consented to  Procedure(s): TRANSESOPHAGEAL ECHOCARDIOGRAM (TEE) (N/A) as a surgical intervention.  The patient's history has been reviewed, patient examined, no change in status, stable for surgery.  I have reviewed the patient's chart and labs.  Questions were answered to the patient's satisfaction.     Makaylia Hewett [employer]

## 2021-05-07 NOTE — H&P (View-Only) (Signed)
Patient ID: Ruben [name], male   DOB: 02-May-1946, 76 y.o.   MRN: [id number]     Advanced Heart Failure Rounding Note  PCP-Cardiologist: Sanda Klein, MD   Subjective:    Co-ox 74% on 0.25 milrinone. CVP 7-8.  Creatinine improving 2.88 => 2.73 => 2.63 => 2.49 =>2.30=>2.28.   Back in NSR, 80s-90s.   Plts stable, up to 158 today. No bleeding.   Feeling good. Denies dyspnea or CP. Up walking halls yesterday.   Objective:   Weight Range: 81.8 kg Body mass index is 25.15 kg/m.   Vital Signs:   Temp:  [98.1 F (36.7 C)-98.6 F (37 C)] 98.1 F (36.7 C) (01/02 1958) Pulse Rate:  [76-95] 87 (01/03 0600) Resp:  [0-43] 27 (01/03 0600) BP: (98-132)/(63-75) 118/66 (01/03 0600) SpO2:  [89 %-98 %] 97 % (01/03 0600) FiO2 (%):  [30 %] 30 % (01/03 0035) Weight:  [81.8 kg] 81.8 kg (01/03 0409) Last BM Date: 05/05/21  Weight change: Filed Weights   05/04/21 0500 05/05/21 0500 05/07/21 0409  Weight: 82.3 kg 79.7 kg 81.8 kg    Intake/Output:   Intake/Output Summary (Last 24 hours) at 05/07/2021 0742 Last data filed at 05/07/2021 0600 Gross per 24 hour  Intake 397.93 ml  Output 1900 ml  Net -1502.07 ml      Physical Exam    CVP 7-8 General:  Lying comfortably in bed. HEENT: normal Neck: supple. no JVD. Carotids 2+ bilat; no bruits.  Cor: PMI nondisplaced. Regular rate & rhythm. No rubs, gallops, 2-3/6 SEM Lungs: clear Abdomen: soft, nontender, nondistended. No hepatosplenomegaly. No bruits or masses. Good bowel sounds. Extremities: no cyanosis, clubbing, rash, edema, RUE PICC Neuro: alert & orientedx3, cranial nerves grossly intact. moves all 4 extremities w/o difficulty. Affect pleasant .   Telemetry   NSR 80s-90s (personally reviewed)  Labs    CBC Recent Labs    05/06/21 0457 05/07/21 0331  WBC 5.8 6.7  HGB 11.7* 12.1*  HCT 35.2* 36.9*  MCV 96.7 96.3  PLT 136* 409   Basic Metabolic Panel Recent Labs    05/06/21 0457 05/07/21 0331  NA 135 135   K 4.7 4.3  CL 103 100  CO2 26 26  GLUCOSE 139* 146*  BUN 42* 37*  CREATININE 2.30* 2.28*  CALCIUM 9.1 9.1   Liver Function Tests Recent Labs    05/05/21 0505 05/06/21 0457  AST 25 27  ALT 37 35  ALKPHOS 43 41  BILITOT 1.0 0.8  PROT 6.2* 6.0*  ALBUMIN 3.2* 3.0*   No results for input(s): LIPASE, AMYLASE in the last 72 hours. Cardiac Enzymes No results for input(s): CKTOTAL, CKMB, CKMBINDEX, TROPONINI in the last 72 hours.  BNP: BNP (last 3 results) Recent Labs    04/29/21 1251  BNP 1,659.6*    ProBNP (last 3 results) No results for input(s): PROBNP in the last 8760 hours.   D-Dimer No results for input(s): DDIMER in the last 72 hours. Hemoglobin A1C No results for input(s): HGBA1C in the last 72 hours. Fasting Lipid Panel No results for input(s): CHOL, HDL, LDLCALC, TRIG, CHOLHDL, LDLDIRECT in the last 72 hours. Thyroid Function Tests No results for input(s): TSH, T4TOTAL, T3FREE, THYROIDAB in the last 72 hours.  Invalid input(s): FREET3  Other results:   Imaging    No results found.   Medications:     Scheduled Medications:  amiodarone  400 mg Oral BID   aspirin EC  81 mg Oral Daily   atorvastatin  80  mg Oral Daily   Chlorhexidine Gluconate Cloth  6 each Topical Daily   folic acid  1 mg Oral Daily   mouth rinse  15 mL Mouth Rinse BID   multivitamin with minerals  1 tablet Oral Daily   polyethylene glycol  17 g Oral Daily   senna-docusate  1 tablet Oral BID   sodium chloride flush  10-40 mL Intracatheter Q12H   sodium chloride flush  3 mL Intravenous Q12H   thiamine  100 mg Oral Daily    Infusions:  sodium chloride     heparin 1,150 Units/hr (05/07/21 0600)   milrinone 0.25 mcg/kg/min (05/07/21 0600)    PRN Medications: sodium chloride, nitroGLYCERIN, sodium chloride flush, sodium chloride flush    Assessment/Plan   1. CAD: Suspect delayed presentation LCx (versus RCA) MI.  Echo with EF 35-40%, inferolateral and basal inferior  akinesis, moderate-severe eccentric likely infarct-related MR, normal RV.  He was not cathed initially due to AKI and delayed presentation. No chest pain.  - Continue statin - Continue aspirin  - Follow creatinine, will need RHC/LHC when improves.  Unlikely to benefit significantly from opening of the infarct-related artery at this point, but need to look for surgical disease.  - If no surgical disease, would add Plavix.  2. Acute systolic CHF/cardiogenic shock: Ischemic cardiomyopathy with infarct-related MR. Echo with EF 35-40%, inferolateral and basal inferior akinesis, moderate-severe eccentric likely infarct-related MR, normal RV.  Echo does not look bad enough to explain low output HF, concerned that MR may be playing a role.  CVP 8 today with co-ox 74% now on milrinone 0.25 and off NE. BP stable.  Creatinine slowly improving, 2.28 today - Hold off on lasix today - Continue current milrinone today as we await renal recovery.    - GDMT limited by AKI - No beta blocker for now with recent shock 3. Hyponatremia: Resolved.  4. Mitral regurgitation: Highly eccentric MR, suspect infarct-related with inferolateral/inferior WMAs.  May be playing a role in low output, probably not fully visualized by TTE (at least moderate-severe).  - Need full evaluation by TEE.  Will arrange for TEE this afternoon.  5. Atrial fibrillation: First noted this admission.  Back in NSR this am.  - Continue heparin gtt.  - Continue po amiodarone.  6. AKI on CKD 3: Baseline creatinine around 1.4. Up to 3.09, now coming down 2.28 today.   - Continue to support cardiac output. 7. Active smoker: Needs to quit.  8. ETOH abuse: Needs to cut back.  9. Thrombocytopenia: Mild, pre-dates heparin initiation. Platelets improved to 158 this am.  Continue to mobilize.  Length of Stay: 8  [facility], [place], PA-C  05/07/2021, 7:42 AM  Advanced Heart Failure Team Pager [phone] (M-F; 7a - 5p)  Please contact [facility] for  night-coverage after hours (5p -7a ) and weekends on [email or web address]  Patient seen with PA, agree with the above note.  He diuresed well yesterday, CVP 8 today.  Creatinine 2.28, mildly lower.  He has gone back into NSR this morning. Co-ox 74% on milrinone 0.25.   General: NAD Neck: No JVD, no thyromegaly or thyroid nodule.  Lungs: Clear to auscultation bilaterally with normal respiratory effort. CV: Nondisplaced PMI.  Heart regular S1/S2, no S3/S4, 2/6 HSM apex.  No peripheral edema.   Abdomen: Soft, nontender, no hepatosplenomegaly, no distention.  Skin: Intact without lesions or rashes.  Neurologic: Alert and oriented x 3.  Psych: Normal affect. Extremities: No clubbing or cyanosis.  HEENT: Normal.  Creatinine slowly coming down, 2.28 today.  Would like to cath eventually, keep observing while down-trending.  Continue milrinone 0.25 with good co-ox.    CVP 8, can hold off on Lasix today.    He is back in NSR today on amiodarone.  He will not need DCCV, will arrange TEE today to assess mitral regurgitation. Discussed risks/benefits, patient agrees to procedure.    Loralie Champagne 05/07/2021 8:29 AM

## 2021-05-07 NOTE — Progress Notes (Addendum)
Patient ID: Ruben [name], male   DOB: 07-22-1945, 76 y.o.   MRN: [id number]     Advanced Heart Failure Rounding Note  PCP-Cardiologist: Sanda Klein, MD   Subjective:    Co-ox 74% on 0.25 milrinone. CVP 7-8.  Creatinine improving 2.88 => 2.73 => 2.63 => 2.49 =>2.30=>2.28.   Back in NSR, 80s-90s.   Plts stable, up to 158 today. No bleeding.   Feeling good. Denies dyspnea or CP. Up walking halls yesterday.   Objective:   Weight Range: 81.8 kg Body mass index is 25.15 kg/m.   Vital Signs:   Temp:  [98.1 F (36.7 C)-98.6 F (37 C)] 98.1 F (36.7 C) (01/02 1958) Pulse Rate:  [76-95] 87 (01/03 0600) Resp:  [0-43] 27 (01/03 0600) BP: (98-132)/(63-75) 118/66 (01/03 0600) SpO2:  [89 %-98 %] 97 % (01/03 0600) FiO2 (%):  [30 %] 30 % (01/03 0035) Weight:  [81.8 kg] 81.8 kg (01/03 0409) Last BM Date: 05/05/21  Weight change: Filed Weights   05/04/21 0500 05/05/21 0500 05/07/21 0409  Weight: 82.3 kg 79.7 kg 81.8 kg    Intake/Output:   Intake/Output Summary (Last 24 hours) at 05/07/2021 0742 Last data filed at 05/07/2021 0600 Gross per 24 hour  Intake 397.93 ml  Output 1900 ml  Net -1502.07 ml      Physical Exam    CVP 7-8 General:  Lying comfortably in bed. HEENT: normal Neck: supple. no JVD. Carotids 2+ bilat; no bruits.  Cor: PMI nondisplaced. Regular rate & rhythm. No rubs, gallops, 2-3/6 SEM Lungs: clear Abdomen: soft, nontender, nondistended. No hepatosplenomegaly. No bruits or masses. Good bowel sounds. Extremities: no cyanosis, clubbing, rash, edema, RUE PICC Neuro: alert & orientedx3, cranial nerves grossly intact. moves all 4 extremities w/o difficulty. Affect pleasant .   Telemetry   NSR 80s-90s (personally reviewed)  Labs    CBC Recent Labs    05/06/21 0457 05/07/21 0331  WBC 5.8 6.7  HGB 11.7* 12.1*  HCT 35.2* 36.9*  MCV 96.7 96.3  PLT 136* 400   Basic Metabolic Panel Recent Labs    05/06/21 0457 05/07/21 0331  NA 135 135   K 4.7 4.3  CL 103 100  CO2 26 26  GLUCOSE 139* 146*  BUN 42* 37*  CREATININE 2.30* 2.28*  CALCIUM 9.1 9.1   Liver Function Tests Recent Labs    05/05/21 0505 05/06/21 0457  AST 25 27  ALT 37 35  ALKPHOS 43 41  BILITOT 1.0 0.8  PROT 6.2* 6.0*  ALBUMIN 3.2* 3.0*   No results for input(s): LIPASE, AMYLASE in the last 72 hours. Cardiac Enzymes No results for input(s): CKTOTAL, CKMB, CKMBINDEX, TROPONINI in the last 72 hours.  BNP: BNP (last 3 results) Recent Labs    04/29/21 1251  BNP 1,659.6*    ProBNP (last 3 results) No results for input(s): PROBNP in the last 8760 hours.   D-Dimer No results for input(s): DDIMER in the last 72 hours. Hemoglobin A1C No results for input(s): HGBA1C in the last 72 hours. Fasting Lipid Panel No results for input(s): CHOL, HDL, LDLCALC, TRIG, CHOLHDL, LDLDIRECT in the last 72 hours. Thyroid Function Tests No results for input(s): TSH, T4TOTAL, T3FREE, THYROIDAB in the last 72 hours.  Invalid input(s): FREET3  Other results:   Imaging    No results found.   Medications:     Scheduled Medications:  amiodarone  400 mg Oral BID   aspirin EC  81 mg Oral Daily   atorvastatin  80  mg Oral Daily   Chlorhexidine Gluconate Cloth  6 each Topical Daily   folic acid  1 mg Oral Daily   mouth rinse  15 mL Mouth Rinse BID   multivitamin with minerals  1 tablet Oral Daily   polyethylene glycol  17 g Oral Daily   senna-docusate  1 tablet Oral BID   sodium chloride flush  10-40 mL Intracatheter Q12H   sodium chloride flush  3 mL Intravenous Q12H   thiamine  100 mg Oral Daily    Infusions:  sodium chloride     heparin 1,150 Units/hr (05/07/21 0600)   milrinone 0.25 mcg/kg/min (05/07/21 0600)    PRN Medications: sodium chloride, nitroGLYCERIN, sodium chloride flush, sodium chloride flush    Assessment/Plan   1. CAD: Suspect delayed presentation LCx (versus RCA) MI.  Echo with EF 35-40%, inferolateral and basal inferior  akinesis, moderate-severe eccentric likely infarct-related MR, normal RV.  He was not cathed initially due to AKI and delayed presentation. No chest pain.  - Continue statin - Continue aspirin  - Follow creatinine, will need RHC/LHC when improves.  Unlikely to benefit significantly from opening of the infarct-related artery at this point, but need to look for surgical disease.  - If no surgical disease, would add Plavix.  2. Acute systolic CHF/cardiogenic shock: Ischemic cardiomyopathy with infarct-related MR. Echo with EF 35-40%, inferolateral and basal inferior akinesis, moderate-severe eccentric likely infarct-related MR, normal RV.  Echo does not look bad enough to explain low output HF, concerned that MR may be playing a role.  CVP 8 today with co-ox 74% now on milrinone 0.25 and off NE. BP stable.  Creatinine slowly improving, 2.28 today - Hold off on lasix today - Continue current milrinone today as we await renal recovery.    - GDMT limited by AKI - No beta blocker for now with recent shock 3. Hyponatremia: Resolved.  4. Mitral regurgitation: Highly eccentric MR, suspect infarct-related with inferolateral/inferior WMAs.  May be playing a role in low output, probably not fully visualized by TTE (at least moderate-severe).  - Need full evaluation by TEE.  Will arrange for TEE this afternoon.  5. Atrial fibrillation: First noted this admission.  Back in NSR this am.  - Continue heparin gtt.  - Continue po amiodarone.  6. AKI on CKD 3: Baseline creatinine around 1.4. Up to 3.09, now coming down 2.28 today.   - Continue to support cardiac output. 7. Active smoker: Needs to quit.  8. ETOH abuse: Needs to cut back.  9. Thrombocytopenia: Mild, pre-dates heparin initiation. Platelets improved to 158 this am.  Continue to mobilize.  Length of Stay: 8  [facility], [place], PA-C  05/07/2021, 7:42 AM  Advanced Heart Failure Team Pager [phone] (M-F; 7a - 5p)  Please contact [facility] for  night-coverage after hours (5p -7a ) and weekends on [email or web address]  Patient seen with PA, agree with the above note.  He diuresed well yesterday, CVP 8 today.  Creatinine 2.28, mildly lower.  He has gone back into NSR this morning. Co-ox 74% on milrinone 0.25.   General: NAD Neck: No JVD, no thyromegaly or thyroid nodule.  Lungs: Clear to auscultation bilaterally with normal respiratory effort. CV: Nondisplaced PMI.  Heart regular S1/S2, no S3/S4, 2/6 HSM apex.  No peripheral edema.   Abdomen: Soft, nontender, no hepatosplenomegaly, no distention.  Skin: Intact without lesions or rashes.  Neurologic: Alert and oriented x 3.  Psych: Normal affect. Extremities: No clubbing or cyanosis.  HEENT: Normal.  Creatinine slowly coming down, 2.28 today.  Would like to cath eventually, keep observing while down-trending.  Continue milrinone 0.25 with good co-ox.    CVP 8, can hold off on Lasix today.    He is back in NSR today on amiodarone.  He will not need DCCV, will arrange TEE today to assess mitral regurgitation. Discussed risks/benefits, patient agrees to procedure.    Loralie Champagne 05/07/2021 8:29 AM

## 2021-05-07 NOTE — Progress Notes (Signed)
CARDIAC REHAB PHASE I   PRE:  Rate/Rhythm: 85 SR    BP: sitting 125/74    SaO2: 91 RA  MODE:  Ambulation: 740 ft   POST:  Rate/Rhythm: 102 ST    BP: sitting 122/61     SaO2: 93 RA  Pt moving well, ambulated without assist. Maintained NSR. Denies SOB or CP. To recliner. Pt had been nauseated in bed before walk however this resolved with getting out of bed. Gave pt and family MI book to review. [phone]   [place], ACSM 05/07/2021 10:21 AM

## 2021-05-07 NOTE — Anesthesia Preprocedure Evaluation (Addendum)
Anesthesia Evaluation  Patient identified by MRN, date of birth, ID band Patient awake    Reviewed: Allergy & Precautions, NPO status , Patient's Chart, lab work & pertinent test results  Airway Mallampati: I  TM Distance: >3 FB Neck ROM: Full    Dental  (+) Poor Dentition, Missing   Pulmonary Current Smoker and Patient abstained from smoking.,    breath sounds clear to auscultation       Cardiovascular hypertension,  Rhythm:Regular Rate:Normal + Systolic murmurs Echo:  1. Inferior wal hypokinesis . Left ventricular ejection fraction, by  estimation, is 35 to 40%. The left ventricle has moderately decreased  function. The left ventricle has no regional wall motion abnormalities.  Left ventricular diastolic parameters are  indeterminate.  2. Right ventricular systolic function is normal. The right ventricular  size is normal.  3. Splay artifact likely ischemic MR given inferior wall motion  abnormality . The mitral valve is abnormal. Moderate to severe mitral  valve regurgitation. No evidence of mitral stenosis.  4. The aortic valve is tricuspid. There is mild calcification of the  aortic valve. There is mild thickening of the aortic valve. Aortic valve  regurgitation is mild. Aortic valve sclerosis/calcification is present,  without any evidence of aortic  stenosis.  5. Aortic dilatation noted. There is mild dilatation of the ascending  aorta, measuring 38 mm.  6. The inferior vena cava is normal in size with greater than 50%  respiratory variability, suggesting right atrial pressure of 3 mmHg.    Neuro/Psych negative neurological ROS  negative psych ROS   GI/Hepatic negative GI ROS, Neg liver ROS,   Endo/Other  negative endocrine ROS  Renal/GU Renal disease     Musculoskeletal  (+) Arthritis ,   Abdominal Normal abdominal exam  (+)   Peds  Hematology negative hematology ROS (+)   Anesthesia Other  Findings   Reproductive/Obstetrics                            Anesthesia Physical Anesthesia Plan  ASA: 3  Anesthesia Plan: MAC   Post-op Pain Management:    Induction: Intravenous  PONV Risk Score and Plan: 0  Airway Management Planned: Natural Airway  Additional Equipment: None  Intra-op Plan:   Post-operative Plan:   Informed Consent: I have reviewed the patients History and Physical, chart, labs and discussed the procedure including the risks, benefits and alternatives for the proposed anesthesia with the patient or authorized representative who has indicated his/her understanding and acceptance.       Plan Discussed with: CRNA  Anesthesia Plan Comments:        Anesthesia Quick Evaluation

## 2021-05-07 NOTE — TOC Benefit Eligibility Note (Signed)
Patient Advocate Encounter ° °Insurance verification completed.   ° °The patient is currently admitted and upon discharge could be taking Eliquis 5 mg. ° °The current 30 day co-pay is, $47.00.  ° °The patient is insured through [employer] Medicare Part D  ° ° ° °Carmelo Reidel, CPhT °Pharmacy Patient Advocate Specialist °[facility] Patient Advocate Team °Direct Number: [phone]  Fax: [phone] ° ° ° ° ° °  °

## 2021-05-08 DIAGNOSIS — R570 Cardiogenic shock: Secondary | ICD-10-CM | POA: Diagnosis not present

## 2021-05-08 LAB — CBC
HCT: 34.4 % — ABNORMAL LOW (ref 39.0–52.0)
Hemoglobin: 11.9 g/dL — ABNORMAL LOW (ref 13.0–17.0)
MCH: 33.2 pg (ref 26.0–34.0)
MCHC: 34.6 g/dL (ref 30.0–36.0)
MCV: 96.1 fL (ref 80.0–100.0)
Platelets: 160 10*3/uL (ref 150–400)
RBC: 3.58 MIL/uL — ABNORMAL LOW (ref 4.22–5.81)
RDW: 13.7 % (ref 11.5–15.5)
WBC: 5.7 10*3/uL (ref 4.0–10.5)
nRBC: 0 % (ref 0.0–0.2)

## 2021-05-08 LAB — BASIC METABOLIC PANEL
Anion gap: 9 (ref 5–15)
BUN: 29 mg/dL — ABNORMAL HIGH (ref 8–23)
CO2: 24 mmol/L (ref 22–32)
Calcium: 9 mg/dL (ref 8.9–10.3)
Chloride: 102 mmol/L (ref 98–111)
Creatinine, Ser: 2.19 mg/dL — ABNORMAL HIGH (ref 0.61–1.24)
GFR, Estimated: 31 mL/min — ABNORMAL LOW (ref >60)
Glucose, Bld: 146 mg/dL — ABNORMAL HIGH (ref 70–99)
Potassium: 4 mmol/L (ref 3.5–5.1)
Sodium: 135 mmol/L (ref 135–145)

## 2021-05-08 LAB — HEPARIN LEVEL (UNFRACTIONATED): Heparin Unfractionated: 0.59 IU/mL (ref 0.30–0.70)

## 2021-05-08 LAB — COOXEMETRY PANEL
Carboxyhemoglobin: 0.9 % (ref 0.5–1.5)
Methemoglobin: 0.9 % (ref 0.0–1.5)
O2 Saturation: 69.4 %
Total hemoglobin: 12 g/dL (ref 12.0–16.0)

## 2021-05-08 LAB — GLUCOSE, CAPILLARY: Glucose-Capillary: 124 mg/dL — ABNORMAL HIGH (ref 70–99)

## 2021-05-08 MED ORDER — DIGOXIN 125 MCG PO TABS
0.0625 mg | ORAL_TABLET | Freq: Every day | ORAL | Status: DC
  Administered 2021-05-08 – 2021-05-12 (×5): 0.0625 mg via ORAL
  Filled 2021-05-08 (×5): qty 1

## 2021-05-08 MED ORDER — MILRINONE LACTATE IN DEXTROSE 20-5 MG/100ML-% IV SOLN
0.1250 ug/kg/min | INTRAVENOUS | Status: AC
  Administered 2021-05-09: 0.125 ug/kg/min via INTRAVENOUS
  Filled 2021-05-08 (×2): qty 100

## 2021-05-08 MED ORDER — POLYETHYLENE GLYCOL 3350 17 G PO PACK
17.0000 g | PACK | Freq: Every day | ORAL | Status: DC | PRN
  Administered 2021-05-23: 16:00:00 17 g via ORAL
  Filled 2021-05-08: qty 1

## 2021-05-08 MED ORDER — AMIODARONE HCL IN DEXTROSE 360-4.14 MG/200ML-% IV SOLN
30.0000 mg/h | INTRAVENOUS | Status: DC
  Administered 2021-05-08 – 2021-05-10 (×7): 60 mg/h via INTRAVENOUS
  Administered 2021-05-10: 22:00:00 30 mg/h via INTRAVENOUS
  Administered 2021-05-10: 60 mg/h via INTRAVENOUS
  Administered 2021-05-11 – 2021-05-14 (×6): 30 mg/h via INTRAVENOUS
  Filled 2021-05-08 (×15): qty 200

## 2021-05-08 NOTE — Progress Notes (Signed)
CARDIAC REHAB PHASE I   PRE:  Rate/Rhythm: 100 afib    BP: sitting 112/83    SaO2: 92 RA  MODE:  Ambulation: 740 ft   POST:  Rate/Rhythm: 112 afib    BP: sitting 114/63     SaO2: 93 RA  Tolerated well, independent. Sts he is nauseated today. VSS. Return to bed. [place], ACSM 05/08/2021 3:04 PM

## 2021-05-08 NOTE — Progress Notes (Addendum)
Patient ID: Ruben [name], male   DOB: [birth year], 76 y.o.   MRN: [id number]     Advanced Heart Failure Rounding Note  PCP-Cardiologist: Sanda Klein, MD   Subjective:    CO-OX 69% on 0.25 milrinone.   CVP 8-9 this am  Creatinine improving slowly, 2.88 => 2.73 => 2.63 => 2.49 =>2.30=>2.28 => 2.19.   1L UOP + 1 unmeasured void  In AF intermittently overnight and after walk this am, rates 80s-90s  No CP or dyspnea. Sitting up in chair.   Objective:   Weight Range: 77.1 kg Body mass index is 23.71 kg/m.   Vital Signs:   Temp:  [97.2 F (36.2 C)-98.5 F (36.9 C)] 98.5 F (36.9 C) (01/04 0400) Pulse Rate:  [77-97] 97 (01/04 0600) Resp:  [11-43] 22 (01/04 0600) BP: (91-136)/(53-100) 107/66 (01/04 0600) SpO2:  [90 %-98 %] 90 % (01/04 0600) FiO2 (%):  [21 %] 21 % (01/03 0913) Weight:  [77.1 kg-79.4 kg] 77.1 kg (01/04 0500) Last BM Date: 05/07/21  Weight change: Filed Weights   05/07/21 0409 05/07/21 1323 05/08/21 0500  Weight: 81.8 kg 79.4 kg 77.1 kg    Intake/Output:   Intake/Output Summary (Last 24 hours) at 05/08/2021 0751 Last data filed at 05/08/2021 0400 Gross per 24 hour  Intake 726.72 ml  Output 700 ml  Net 26.72 ml      Physical Exam    CVP 8-9 General:  Sitting up in chair. No distress. HEENT: normal Neck: supple. Jvp 8-10 cm. Carotids 2+ bilat; no bruits.  Cor: PMI nondisplaced. Irregular rhythm. No rubs, gallops or murmurs. Lungs: clear Abdomen: soft, nontender, nondistended. No hepatosplenomegaly. No bruits or masses. Good bowel sounds. Extremities: no cyanosis, clubbing, rash, edema Neuro: alert & orientedx3, cranial nerves grossly intact. moves all 4 extremities w/o difficulty. Affect pleasant   Telemetry   AF 80s-90s (personally reviewed)  Labs    CBC Recent Labs    05/07/21 0331 05/08/21 0311  WBC 6.7 5.7  HGB 12.1* 11.9*  HCT 36.9* 34.4*  MCV 96.3 96.1  PLT 158 607   Basic Metabolic Panel Recent Labs     05/07/21 0331 05/08/21 0311  NA 135 135  K 4.3 4.0  CL 100 102  CO2 26 24  GLUCOSE 146* 146*  BUN 37* 29*  CREATININE 2.28* 2.19*  CALCIUM 9.1 9.0   Liver Function Tests Recent Labs    05/06/21 0457  AST 27  ALT 35  ALKPHOS 41  BILITOT 0.8  PROT 6.0*  ALBUMIN 3.0*   No results for input(s): LIPASE, AMYLASE in the last 72 hours. Cardiac Enzymes No results for input(s): CKTOTAL, CKMB, CKMBINDEX, TROPONINI in the last 72 hours.  BNP: BNP (last 3 results) Recent Labs    04/29/21 1251  BNP 1,659.6*    ProBNP (last 3 results) No results for input(s): PROBNP in the last 8760 hours.   D-Dimer No results for input(s): DDIMER in the last 72 hours. Hemoglobin A1C No results for input(s): HGBA1C in the last 72 hours. Fasting Lipid Panel No results for input(s): CHOL, HDL, LDLCALC, TRIG, CHOLHDL, LDLDIRECT in the last 72 hours. Thyroid Function Tests No results for input(s): TSH, T4TOTAL, T3FREE, THYROIDAB in the last 72 hours.  Invalid input(s): FREET3  Other results:   Imaging    ECHO TEE  Result Date: 05/07/2021    TRANSESOPHOGEAL ECHO REPORT   Patient Name:   Ruben [name] [facility] Date of Exam: 05/07/2021 Medical Rec #:  [id number]  Height:       71.0 in Accession #:    [id number]            Weight:       180.3 lb Date of Birth:  1946/04/20             BSA:          2.018 m Patient Age:    56 years              BP:           126/71 mmHg Patient Gender: M                     HR:           91 bpm. Exam Location:  Inpatient Procedure: 3D Echo, Transesophageal Echo, Cardiac Doppler and Color Doppler Indications:     I34.0 Nonrheumatic mitral (valve) insufficiency  History:         Patient has prior history of Echocardiogram examinations, most                  recent 04/30/2021. Previous Myocardial Infarction and CAD; Risk                  Factors:Dyslipidemia and Current Smoker. Cardiac shock.  Sonographer:     Roseanna Rainbow RDCS Referring Phys:  [place] Diagnosing Phys: Kirk Ruths McleanMD PROCEDURE: After discussion of the risks and benefits of a TEE, an informed consent was obtained from the patient. The transesophogeal probe was passed without difficulty through the esophogus of the patient. Imaged were obtained with the patient in a left lateral decubitus position. Sedation performed by different physician. The patient was monitored while under deep sedation. Anesthestetic sedation was provided intravenously by Anesthesiology: 330mg  of Propofol, 80mg  of Lidocaine. The patient's vital signs; including heart rate, blood pressure, and oxygen saturation; remained stable throughout the procedure. Supplementary images were obtained from transthoracic windows as indicated to answer the clinical question. The patient developed no complications during the procedure. IMPRESSIONS  1. Left ventricular ejection fraction, by estimation, is 35 to 40%. The left ventricle has moderately decreased function. The left ventricle demonstrates regional wall motion abnormalities with basal to mid inferior and inferolateral akinesis.  2. Peak RV-RA gradient 37 mmHg. Right ventricular systolic function is normal. The right ventricular size is normal.  3. Left atrial size was mildly dilated. No left atrial/left atrial appendage thrombus was detected.  4. The aortic valve is tricuspid. Aortic valve regurgitation is moderate. No aortic stenosis is present.  5. No PFO or ASD by color doppler.  6. Aortic dilatation noted. There is moderate dilatation of the ascending aorta, measuring 44 mm.  7. The mitral valve is abnormal. Suspect infarct-related MR with PISA ERO 0.27 cm^2 on TEE images with no systolic flow reversal in the pulmonary vein doppler pattern. However, on TTE images, MR looks worse with PISA ERO 0.48 cm^2. Overall, I think that  the MR is more in the moderate range. No evidence of mitral stenosis. FINDINGS  Left Ventricle: Left ventricular ejection fraction, by estimation, is  35 to 40%. The left ventricle has moderately decreased function. The left ventricle demonstrates regional wall motion abnormalities. The left ventricular internal cavity size was normal in size. There is no left ventricular hypertrophy. Right Ventricle: Peak RV-RA gradient 37 mmHg. The right ventricular size is normal. No increase in right ventricular wall thickness. Right ventricular systolic function is normal. Left Atrium: Left  atrial size was mildly dilated. No left atrial/left atrial appendage thrombus was detected. Right Atrium: Right atrial size was normal in size. Pericardium: There is no evidence of pericardial effusion. Mitral Valve: The mitral valve is abnormal. Moderate mitral valve regurgitation. No evidence of mitral valve stenosis. Tricuspid Valve: The tricuspid valve is normal in structure. Tricuspid valve regurgitation is trivial. Aortic Valve: The aortic valve is tricuspid. Aortic valve regurgitation is moderate. No aortic stenosis is present. Pulmonic Valve: The pulmonic valve was normal in structure. Pulmonic valve regurgitation is mild. Aorta: Aortic dilatation noted. There is moderate dilatation of the ascending aorta, measuring 44 mm. IAS/Shunts: No PFO or ASD by color doppler.  MR Peak grad:    55.5 mmHg    TRICUSPID VALVE MR Mean grad:    36.5 mmHg    TR Peak grad:   37.0 mmHg MR Vmax:         372.50 cm/s  TR Vmax:        304.00 cm/s MR Vmean:        280.5 cm/s MR PISA:         2.79 cm MR PISA Eff ROA: 46 mm MR PISA Radius:  0.67 cm [place] Martine McleanMD Electronically signed by Franki Monte Signature Date/Time: 05/07/2021/3:38:06 PM    Final      Medications:     Scheduled Medications:  amiodarone  400 mg Oral BID   aspirin EC  81 mg Oral Daily   atorvastatin  80 mg Oral Daily   Chlorhexidine Gluconate Cloth  6 each Topical Daily   folic acid  1 mg Oral Daily   mouth rinse  15 mL Mouth Rinse BID   multivitamin with minerals  1 tablet Oral Daily   polyethylene glycol  17 g Oral  Daily   senna-docusate  1 tablet Oral BID   sodium chloride flush  10-40 mL Intracatheter Q12H   sodium chloride flush  3 mL Intravenous Q12H   thiamine  100 mg Oral Daily    Infusions:  sodium chloride     heparin 1,150 Units/hr (05/08/21 0400)   milrinone 0.25 mcg/kg/min (05/08/21 0641)    PRN Medications: sodium chloride, nitroGLYCERIN, ondansetron (ZOFRAN) IV, sodium chloride flush, sodium chloride flush    Assessment/Plan   1. CAD: Suspect delayed presentation LCx (versus RCA) MI.  Echo with EF 35-40%, inferolateral and basal inferior akinesis, moderate-severe eccentric likely infarct-related MR, normal RV.  He was not cathed initially due to AKI and delayed presentation. No chest pain.  - Continue statin - Continue aspirin  - Tentatively plan for [facility] tomorrow pending Scr.  Unlikely to benefit significantly from opening of the infarct-related artery at this point, but need to look for surgical disease.  - If no surgical disease, would add Plavix.  2. Acute systolic CHF/cardiogenic shock: Ischemic cardiomyopathy with infarct-related MR. Echo with EF 35-40%, inferolateral and basal inferior akinesis, moderate-severe eccentric likely infarct-related MR, normal RV.  Echo does not look bad enough to explain low output HF, concerned that MR may be playing a role.  TEE on 01/03 with EF 35-40%, moderate MR (suspect infarct-related), aortic dilatation measuring 44 mm, moderate AI.  - Co-ox 69% on 0.25 milrinone. Will decrease to 0.125. Follow co-ox - CVP 8-9. May benefit from one dose IV lasix today.  - GDMT limited by AKI - No beta blocker for now with recent shock 3. Hyponatremia: Resolved.  4. Valvular heart disease: -MR moderate in severity on TEE. Likely infarct related. Moderate AI. EF 35-40%  5. Atrial fibrillation: First noted this admission. SR yesterday, back in AF overnight/this am. - Continue heparin gtt.  - Switch po amio to IV amio at 60/hr - Can arrange DCCV tomorrow  if does not convert today 6. AKI on CKD 3: Baseline creatinine around 1.4. Up to 3.09, now coming down 2.19 today.   - Continue to support cardiac output. 7. Active smoker: Needs to quit.  8. ETOH abuse: Needs to cut back.  9. Thrombocytopenia: Mild, pre-dates heparin initiation. Resolved.  Continue to mobilize.  Okay to transfer out of ICU to [facility]  Length of Stay: [address], [place], PA-C  05/08/2021, 7:51 AM  Advanced Heart Failure Team Pager [phone] (M-F; 7a - 5p)  Please contact [facility] for night-coverage after hours (5p -7a ) and weekends on [email or web address]  Patient seen with PA, agree with the above note.   He went back into atrial fibrillation with controlled rate overnight.  Creatinine lower 2.19.  Today, CVP 8 range with co-ox 69%.    No complaints.   TEE yesterday with EF 35-40%, moderate infarct-related MR and moderate AI.   General: NAD Neck: JVP 8 cm, no thyromegaly or thyroid nodule.  Lungs: Clear to auscultation bilaterally with normal respiratory effort. CV: Nondisplaced PMI.  Heart regular S1/S2, no S3/S4, 2/6 HSM apex.  No peripheral edema.  Abdomen: Soft, nontender, no hepatosplenomegaly, no distention.  Skin: Intact without lesions or rashes.  Neurologic: Alert and oriented x 3.  Psych: Normal affect. Extremities: No clubbing or cyanosis.  HEENT: Normal.   I will put him back on IV amiodarone gtt overnight, hopefully he will convert back to NSR again on his own as he did before.  Continue heparin gtt.   Decrease milrinone to 0.125 today, add digoxin 0.0625 daily.   MR looked like it was infarct-related and moderate to at most moderate-severe on TEE.    Creatinine slowly declining, tentative plan for cath tomorrow.   Loralie Champagne 05/08/2021 8:34 AM

## 2021-05-08 NOTE — Progress Notes (Signed)
ANTICOAGULATION CONSULT NOTE  Pharmacy Consult for Heparin Indication: chest pain/ACS  Allergies  Allergen Reactions   Other Other (See Comments)    Pollen - unknown    Patient Measurements: Height: 5\' 11"  (180.3 cm) Weight: 77.1 kg (169 lb 15.6 oz) IBW/kg (Calculated) : 75.3 Heparin Dosing Weight: 81.6 kg  Vital Signs: Temp: 98.5 F (36.9 C) (01/04 0400) Temp Source: Oral (01/04 0400) BP: 107/66 (01/04 0600) Pulse Rate: 97 (01/04 0600)  Labs: Recent Labs    05/06/21 0457 05/07/21 0331 05/08/21 0311  HGB 11.7* 12.1* 11.9*  HCT 35.2* 36.9* 34.4*  PLT 136* 158 160  HEPARINUNFRC 0.47 0.47 0.59  CREATININE 2.30* 2.28* 2.19*     Estimated Creatinine Clearance: 31 mL/min (A) (by C-G formula based on SCr of 2.19 mg/dL (H)).   Medical History: Past Medical History:  Diagnosis Date   Arthritis    Chronic kidney disease, stage 3b ([place])    ETOH abuse    History of stress test 09/02/2008   showed inferolateral scar without ischemia   Hx of echocardiogram 09/02/2008   was essentially normal   Hyperlipidemia    Hypertension    Tobacco abuse     Medications:   Infusions:   sodium chloride     heparin 1,150 Units/hr (05/08/21 0400)   milrinone 0.25 mcg/kg/min (05/08/21 0641)    Assessment: 1 yoM presenting in respiratory distress. Heparin per pharmacy consult placed for chest pain/ACS, pt also with new onset AF.  Heparin level is therapeutic at 0.59, CBC stable.  Goal of Therapy:  Heparin level 0.3-0.7 units/ml Monitor platelets by anticoagulation protocol: Yes   Plan:  Continue IV heparin to 1150 units/hr  Daily heparin level and CBC   Arrie Senate, PharmD, [place], [facility] Clinical Pharmacist [phone] Please check AMION for all [facility] Pharmacy numbers 05/08/2021

## 2021-05-08 NOTE — Anesthesia Postprocedure Evaluation (Signed)
Anesthesia Post Note  Patient: Ruben [name]  Procedure(s) Performed: TRANSESOPHAGEAL ECHOCARDIOGRAM (TEE)     Patient location during evaluation: PACU Anesthesia Type: MAC Level of consciousness: awake and alert Pain management: pain level controlled Vital Signs Assessment: post-procedure vital signs reviewed and stable Respiratory status: spontaneous breathing, nonlabored ventilation, respiratory function stable and patient connected to nasal cannula oxygen Cardiovascular status: stable and blood pressure returned to baseline Postop Assessment: no apparent nausea or vomiting Anesthetic complications: no   No notable events documented.            Effie Berkshire

## 2021-05-09 ENCOUNTER — Inpatient Hospital Stay (HOSPITAL_COMMUNITY): Payer: Medicare Other

## 2021-05-09 ENCOUNTER — Inpatient Hospital Stay (HOSPITAL_COMMUNITY)
Admission: EM | Disposition: A | Discharge status: Home / Self Care | Payer: Self-pay | Source: Ambulatory Visit | Attending: Cardiovascular Disease

## 2021-05-09 DIAGNOSIS — I4819 Other persistent atrial fibrillation: Secondary | ICD-10-CM

## 2021-05-09 DIAGNOSIS — I2510 Atherosclerotic heart disease of native coronary artery without angina pectoris: Secondary | ICD-10-CM | POA: Diagnosis not present

## 2021-05-09 DIAGNOSIS — I5021 Acute systolic (congestive) heart failure: Secondary | ICD-10-CM

## 2021-05-09 DIAGNOSIS — R570 Cardiogenic shock: Secondary | ICD-10-CM | POA: Diagnosis not present

## 2021-05-09 DIAGNOSIS — I340 Nonrheumatic mitral (valve) insufficiency: Secondary | ICD-10-CM

## 2021-05-09 DIAGNOSIS — I351 Nonrheumatic aortic (valve) insufficiency: Secondary | ICD-10-CM

## 2021-05-09 DIAGNOSIS — I25110 Atherosclerotic heart disease of native coronary artery with unstable angina pectoris: Secondary | ICD-10-CM

## 2021-05-09 HISTORY — PX: RIGHT/LEFT HEART CATH AND CORONARY ANGIOGRAPHY: CATH118266

## 2021-05-09 LAB — POCT I-STAT EG7
Acid-base deficit: 1 mmol/L (ref 0.0–2.0)
Acid-base deficit: 1 mmol/L (ref 0.0–2.0)
Bicarbonate: 25.1 mmol/L (ref 20.0–28.0)
Bicarbonate: 25.1 mmol/L (ref 20.0–28.0)
Calcium, Ion: 1.24 mmol/L (ref 1.15–1.40)
Calcium, Ion: 1.25 mmol/L (ref 1.15–1.40)
HCT: 42 % (ref 39.0–52.0)
HCT: 42 % (ref 39.0–52.0)
Hemoglobin: 14.3 g/dL (ref 13.0–17.0)
Hemoglobin: 14.3 g/dL (ref 13.0–17.0)
O2 Saturation: 62 %
O2 Saturation: 63 %
Potassium: 4.3 mmol/L (ref 3.5–5.1)
Potassium: 4.4 mmol/L (ref 3.5–5.1)
Sodium: 135 mmol/L (ref 135–145)
Sodium: 135 mmol/L (ref 135–145)
TCO2: 27 mmol/L (ref 22–32)
TCO2: 27 mmol/L (ref 22–32)
pCO2, Ven: 46.8 mmHg (ref 44.0–60.0)
pCO2, Ven: 47.2 mmHg (ref 44.0–60.0)
pH, Ven: 7.333 (ref 7.250–7.430)
pH, Ven: 7.338 (ref 7.250–7.430)
pO2, Ven: 35 mmHg (ref 32.0–45.0)
pO2, Ven: 35 mmHg (ref 32.0–45.0)

## 2021-05-09 LAB — BASIC METABOLIC PANEL
Anion gap: 14 (ref 5–15)
BUN: 22 mg/dL (ref 8–23)
CO2: 22 mmol/L (ref 22–32)
Calcium: 8.5 mg/dL — ABNORMAL LOW (ref 8.9–10.3)
Chloride: 93 mmol/L — ABNORMAL LOW (ref 98–111)
Creatinine, Ser: 2 mg/dL — ABNORMAL HIGH (ref 0.61–1.24)
GFR, Estimated: 34 mL/min — ABNORMAL LOW (ref >60)
Glucose, Bld: 415 mg/dL — ABNORMAL HIGH (ref 70–99)
Potassium: 3.6 mmol/L (ref 3.5–5.1)
Sodium: 129 mmol/L — ABNORMAL LOW (ref 135–145)

## 2021-05-09 LAB — CBC
HCT: 36.3 % — ABNORMAL LOW (ref 39.0–52.0)
Hemoglobin: 12.3 g/dL — ABNORMAL LOW (ref 13.0–17.0)
MCH: 31.9 pg (ref 26.0–34.0)
MCHC: 33.9 g/dL (ref 30.0–36.0)
MCV: 94.3 fL (ref 80.0–100.0)
Platelets: 166 10*3/uL (ref 150–400)
RBC: 3.85 MIL/uL — ABNORMAL LOW (ref 4.22–5.81)
RDW: 13.7 % (ref 11.5–15.5)
WBC: 7 10*3/uL (ref 4.0–10.5)
nRBC: 0 % (ref 0.0–0.2)

## 2021-05-09 LAB — MAGNESIUM: Magnesium: 1.9 mg/dL (ref 1.7–2.4)

## 2021-05-09 LAB — COOXEMETRY PANEL
Carboxyhemoglobin: 0.8 % (ref 0.5–1.5)
Carboxyhemoglobin: 0.7 % (ref 0.5–1.5)
Methemoglobin: 0.8 % (ref 0.0–1.5)
Methemoglobin: 0.8 % (ref 0.0–1.5)
O2 Saturation: 54.2 %
O2 Saturation: 54.2 %
Total hemoglobin: 12.6 g/dL (ref 12.0–16.0)
Total hemoglobin: 14.3 g/dL (ref 12.0–16.0)

## 2021-05-09 LAB — GLUCOSE, CAPILLARY: Glucose-Capillary: 121 mg/dL — ABNORMAL HIGH (ref 70–99)

## 2021-05-09 LAB — HEPARIN LEVEL (UNFRACTIONATED): Heparin Unfractionated: 0.68 IU/mL (ref 0.30–0.70)

## 2021-05-09 SURGERY — RIGHT/LEFT HEART CATH AND CORONARY ANGIOGRAPHY
Anesthesia: LOCAL

## 2021-05-09 MED ORDER — MAGNESIUM SULFATE 2 GM/50ML IV SOLN
2.0000 g | Freq: Once | INTRAVENOUS | Status: AC
  Administered 2021-05-09: 2 g via INTRAVENOUS
  Filled 2021-05-09: qty 50

## 2021-05-09 MED ORDER — FENTANYL CITRATE (PF) 100 MCG/2ML IJ SOLN
INTRAMUSCULAR | Status: DC | PRN
  Administered 2021-05-09: 25 ug via INTRAVENOUS

## 2021-05-09 MED ORDER — SODIUM CHLORIDE 0.9 % IV SOLN: 250.0000 mL | INTRAVENOUS | Status: DC | PRN

## 2021-05-09 MED ORDER — SODIUM CHLORIDE 0.9% FLUSH
3.0000 mL | Freq: Two times a day (BID) | INTRAVENOUS | Status: DC
  Administered 2021-05-09 – 2021-05-27 (×20): 3 mL via INTRAVENOUS

## 2021-05-09 MED ORDER — SODIUM CHLORIDE 0.9% FLUSH
3.0000 mL | INTRAVENOUS | Status: DC | PRN
  Administered 2021-05-11 – 2021-05-18 (×2): 3 mL via INTRAVENOUS

## 2021-05-09 MED ORDER — LIDOCAINE HCL (PF) 1 % IJ SOLN
INTRAMUSCULAR | Status: DC | PRN
  Administered 2021-05-09 (×2): 2 mL via INTRADERMAL

## 2021-05-09 MED ORDER — FUROSEMIDE 10 MG/ML IJ SOLN
6.0000 mg/h | INTRAVENOUS | Status: DC
  Administered 2021-05-09: 8 mg/h via INTRAVENOUS
  Administered 2021-05-10: 6 mg/h via INTRAVENOUS
  Filled 2021-05-09 (×4): qty 20

## 2021-05-09 MED ORDER — MIDAZOLAM HCL 2 MG/2ML IJ SOLN
INTRAMUSCULAR | Status: DC | PRN
  Administered 2021-05-09: 1 mg via INTRAVENOUS

## 2021-05-09 MED ORDER — SODIUM CHLORIDE 0.9% FLUSH: 3.0000 mL | INTRAVENOUS | Status: DC | PRN

## 2021-05-09 MED ORDER — SODIUM CHLORIDE 0.9% FLUSH: 3.0000 mL | Freq: Two times a day (BID) | INTRAVENOUS | Status: DC

## 2021-05-09 MED ORDER — VERAPAMIL HCL 2.5 MG/ML IV SOLN
INTRAVENOUS | Status: DC | PRN
  Administered 2021-05-09: 10 mL via INTRA_ARTERIAL

## 2021-05-09 MED ORDER — FUROSEMIDE 10 MG/ML IJ SOLN: 80.0000 mg | Freq: Once | INTRAMUSCULAR | Status: DC

## 2021-05-09 MED ORDER — ACETAMINOPHEN 325 MG PO TABS
650.0000 mg | ORAL_TABLET | ORAL | Status: DC | PRN
  Administered 2021-05-13 – 2021-05-28 (×4): 650 mg via ORAL
  Filled 2021-05-09 (×4): qty 2

## 2021-05-09 MED ORDER — MILRINONE LACTATE IN DEXTROSE 20-5 MG/100ML-% IV SOLN
0.1250 ug/kg/min | INTRAVENOUS | Status: DC
  Administered 2021-05-09 – 2021-05-11 (×3): 0.25 ug/kg/min via INTRAVENOUS
  Administered 2021-05-12: 0.125 ug/kg/min via INTRAVENOUS
  Filled 2021-05-09 (×5): qty 100

## 2021-05-09 MED ORDER — FENTANYL CITRATE (PF) 100 MCG/2ML IJ SOLN
INTRAMUSCULAR | Status: AC
  Filled 2021-05-09: qty 2

## 2021-05-09 MED ORDER — HEPARIN SODIUM (PORCINE) 1000 UNIT/ML IJ SOLN
INTRAMUSCULAR | Status: AC
  Filled 2021-05-09: qty 10

## 2021-05-09 MED ORDER — MIDAZOLAM HCL 2 MG/2ML IJ SOLN
INTRAMUSCULAR | Status: AC
  Filled 2021-05-09: qty 2

## 2021-05-09 MED ORDER — HEPARIN SODIUM (PORCINE) 1000 UNIT/ML IJ SOLN
INTRAMUSCULAR | Status: DC | PRN
  Administered 2021-05-09: 4000 [IU] via INTRAVENOUS

## 2021-05-09 MED ORDER — ONDANSETRON HCL 4 MG/2ML IJ SOLN
4.0000 mg | Freq: Four times a day (QID) | INTRAMUSCULAR | Status: DC | PRN
  Administered 2021-05-27: 4 mg via INTRAVENOUS
  Filled 2021-05-09: qty 2

## 2021-05-09 MED ORDER — IOHEXOL 350 MG/ML SOLN
INTRAVENOUS | Status: DC | PRN
  Administered 2021-05-09: 40 mL via INTRA_ARTERIAL

## 2021-05-09 MED ORDER — SODIUM CHLORIDE 0.9 % IV SOLN: INTRAVENOUS | Status: AC

## 2021-05-09 MED ORDER — NITROGLYCERIN 0.4 MG SL SUBL
SUBLINGUAL_TABLET | SUBLINGUAL | Status: DC | PRN
  Administered 2021-05-09: .4 mg via SUBLINGUAL

## 2021-05-09 MED ORDER — ALPRAZOLAM 0.5 MG PO TABS
0.2500 mg | ORAL_TABLET | Freq: Three times a day (TID) | ORAL | Status: DC | PRN
  Administered 2021-05-09 – 2021-05-24 (×10): 0.25 mg via ORAL
  Filled 2021-05-09 (×10): qty 1

## 2021-05-09 MED ORDER — NITROGLYCERIN IN D5W 200-5 MCG/ML-% IV SOLN: 0.0000 ug/min | INTRAVENOUS | Status: DC

## 2021-05-09 MED ORDER — SODIUM CHLORIDE 0.9 % IV SOLN: INTRAVENOUS | Status: DC

## 2021-05-09 MED ORDER — AMIODARONE HCL 150 MG/3ML IV SOLN
INTRAVENOUS | Status: AC
  Filled 2021-05-09: qty 3

## 2021-05-09 MED ORDER — FUROSEMIDE 10 MG/ML IJ SOLN
INTRAMUSCULAR | Status: AC
  Filled 2021-05-09: qty 4

## 2021-05-09 MED ORDER — AMIODARONE HCL IN DEXTROSE 360-4.14 MG/200ML-% IV SOLN
INTRAVENOUS | Status: AC
  Filled 2021-05-09: qty 200

## 2021-05-09 MED ORDER — HEPARIN (PORCINE) 25000 UT/250ML-% IV SOLN
1150.0000 [IU]/h | INTRAVENOUS | Status: DC
  Administered 2021-05-09: 1150 [IU]/h via INTRAVENOUS
  Filled 2021-05-09: qty 250

## 2021-05-09 MED ORDER — LIDOCAINE HCL (PF) 1 % IJ SOLN
INTRAMUSCULAR | Status: AC
  Filled 2021-05-09: qty 30

## 2021-05-09 MED ORDER — HEPARIN (PORCINE) IN NACL 1000-0.9 UT/500ML-% IV SOLN
INTRAVENOUS | Status: AC
  Filled 2021-05-09: qty 1000

## 2021-05-09 MED ORDER — NITROGLYCERIN IN D5W 200-5 MCG/ML-% IV SOLN
INTRAVENOUS | Status: DC | PRN
  Administered 2021-05-09: 20 ug/min via INTRAVENOUS

## 2021-05-09 MED ORDER — POTASSIUM CHLORIDE CRYS ER 20 MEQ PO TBCR
40.0000 meq | EXTENDED_RELEASE_TABLET | Freq: Two times a day (BID) | ORAL | Status: AC
Start: 2021-05-09 — End: 2021-05-10
  Administered 2021-05-09 – 2021-05-10 (×2): 40 meq via ORAL
  Filled 2021-05-09 (×2): qty 2

## 2021-05-09 MED ORDER — AMIODARONE LOAD VIA INFUSION
INTRAVENOUS | Status: DC | PRN
  Administered 2021-05-09: 150 mg via INTRAVENOUS

## 2021-05-09 MED ORDER — HEPARIN (PORCINE) IN NACL 1000-0.9 UT/500ML-% IV SOLN
INTRAVENOUS | Status: DC | PRN
  Administered 2021-05-09 (×2): 500 mL

## 2021-05-09 MED ORDER — FUROSEMIDE 10 MG/ML IJ SOLN
INTRAMUSCULAR | Status: DC | PRN
  Administered 2021-05-09: 40 mg via INTRAVENOUS

## 2021-05-09 MED ORDER — HYDRALAZINE HCL 20 MG/ML IJ SOLN: 10.0000 mg | INTRAMUSCULAR | Status: AC | PRN

## 2021-05-09 MED ORDER — POTASSIUM CHLORIDE CRYS ER 20 MEQ PO TBCR
40.0000 meq | EXTENDED_RELEASE_TABLET | Freq: Once | ORAL | Status: AC
  Administered 2021-05-09: 40 meq via ORAL
  Filled 2021-05-09: qty 2

## 2021-05-09 MED ORDER — LABETALOL HCL 5 MG/ML IV SOLN: 10.0000 mg | INTRAVENOUS | Status: AC | PRN

## 2021-05-09 SURGICAL SUPPLY — 11 items
CATH 5FR JL3.5 JR4 ANG PIG MP (CATHETERS) ×2 IMPLANT
CATH BALLN WEDGE 5F 110CM (CATHETERS) ×2 IMPLANT
DEVICE RAD COMP TR BAND LRG (VASCULAR PRODUCTS) ×2 IMPLANT
GLIDESHEATH SLEND SS 6F .021 (SHEATH) ×2 IMPLANT
GUIDEWIRE INQWIRE 1.5J.035X260 (WIRE) IMPLANT
INQWIRE 1.5J .035X260CM (WIRE) ×3
KIT HEART LEFT (KITS) ×3 IMPLANT
PACK CARDIAC CATHETERIZATION (CUSTOM PROCEDURE TRAY) ×3 IMPLANT
SHEATH GLIDE SLENDER 4/5FR (SHEATH) ×2 IMPLANT
TRANSDUCER W/STOPCOCK (MISCELLANEOUS) ×3 IMPLANT
WIRE EMERALD 3MM-J .025X260CM (WIRE) ×2 IMPLANT

## 2021-05-09 NOTE — Consult Note (Addendum)
[address] Village[address]       [place] [address]             [phone]        Ruben [name] Medical Record #[id number] Date of Birth: [birth year]  Referring: Dr. Aundra Dubin, MD Primary Care: Ruben Brine, FNP Primary Cardiologist:Ruben Croitoru, MD Was seen previously by Dr. Gwenlyn Found, MD  Chief Complaint:    Chief Complaint  Patient presents with   Respiratory Distress, chest pain  Reason for consultation: Coronary artery disease, moderate AI and moderate MR  History of Present Illness:     This is a 76 year old male with a past medical history of hypertension, hyperlipidemia, CKD (stage IIIb), tobacco abuse, and alcohol abuse who presented initially to [facility] on 04/29/2021 with chest pain and shortness of breath that started about 2 days prior. Wife notes he has been more fatigued of late as well. He denied nausea or vomiting. Patient was then transported to [facility] ED secondary to respiratory distress and chest pain.  Patient on bi pap. Initial Troponin I (high sensitivity) was 20,091 and BNP was 1659.6. It was suspected that patient had late presenting MI and was in acute heart failure. Patient went into a fib with RVR, given Albuterol neb, Amiodarone bolus followed by drip, IV Lasix, ec ASA, and Nitro drip. Echo done 04/30/2021 showed LVEF 35-40%, mild AI, mild dilatation of the ascending aorta 38 mm, and severe MR. TEE done 05/07/2021 showed LVEF 35-40%, moderate AI, moderate MR (possibly infarct related) , moderate dilatation of ascending aorta 44 mm. Cardiac catheterization done today showed mid LAD 70% stenosis, first Diagonal 70% stenosis, proximal and distal Circumflex 80% stenosis, proximal to mid RCA with 100% stenosis, Ramus Intermediate with a 60% stenosis. Dr. Cyndia [name] has been consulted for consideration of coronary artery bypass grafting surgery, +/- AVR, +/- MVR. At the time of being seen, patient in a fib with HR in the 110's,  sitting upright in the bed with wife at bedside, and he has tachypnea. CXR done, while I was present, showed cardiomegaly and central pulmonary vascular congestion and small bilateral pleural effusions. Lasix drip ordered (CVP 16) by advanced heart failure.  Current Activity/ Functional Status: Patient is independent with mobility/ambulation, transfers, ADL's, IADL's.   Zubrod Score: At the time of surgery this patients most appropriate activity status/level should be described as: []     0    Normal activity, no symptoms [x]     1    Restricted in physical strenuous activity but ambulatory, able to do out light work []     2    Ambulatory and capable of self care, unable to do work activities, up and about more than 50%  of the time                            []     3    Only limited self care, in bed greater than 50% of waking hours []     4    Completely disabled, no self care, confined to bed or chair []     5    Moribund  Past Medical History:  Diagnosis Date   Arthritis    Chronic kidney disease, stage 3b ([place])    ETOH abuse    History of stress test 09/02/2008   showed inferolateral scar without ischemia   Hx of echocardiogram 09/02/2008   was  essentially normal   Hyperlipidemia    Hypertension    Tobacco abuse     Past Surgical History:  Procedure Laterality Date   BACK SURGERY     Dr Ruben [name] involving L3-L4 discectomy.   HERNIA REPAIR     SPINE SURGERY      Social History   Tobacco Use  Smoking Status Some Days   Packs/day: 1.50   Years: 20.00   Pack years: 30.00   Types: Cigarettes  Smokeless Tobacco Never  Tobacco Comments   down to 2 packs per week; 3/21 - down to 1 Pack per week    Social History   Substance and Sexual Activity  Alcohol Use No   Alcohol/week: 0.0 standard drinks    Allergies: Allergies  Allergen Reactions   Other Other (See Comments)    Pollen - unknown    Current Facility-Administered Medications  Medication Dose Route Frequency  Provider Last Rate Last Admin   0.9 %  sodium chloride infusion  250 mL Intravenous PRN Ruben Catching, PA-C   New Bag at 05/07/21 1346   0.9 %  sodium chloride infusion  250 mL Intravenous PRN Ruben Dresser, MD       acetaminophen (TYLENOL) tablet 650 mg  650 mg Oral Q4H PRN Ruben Dresser, MD       amiodarone (NEXTERONE PREMIX) 360-4.14 MG/200ML-% (1.8 mg/mL) IV infusion  60 mg/hr Intravenous Continuous Ruben Catching, PA-C 33.3 mL/hr at 05/09/21 0844 60 mg/hr at 05/09/21 0844   aspirin EC tablet 81 mg  81 mg Oral Daily Ruben [name], [place]   81 mg at 05/09/21 3557   atorvastatin (LIPITOR) tablet 80 mg  80 mg Oral Daily Ruben [name], [place]   80 mg at 05/09/21 3220   Chlorhexidine Gluconate Cloth 2 % PADS 6 each  6 each Topical Daily Ruben Catching, PA-C   6 each at 05/08/21 2154   digoxin (LANOXIN) tablet 0.0625 mg  0.0625 mg Oral Daily Ruben Catching, PA-C   0.0625 mg at 25/42/70 6237   folic acid (FOLVITE) tablet 1 mg  1 mg Oral Daily Ruben Catching, PA-C   1 mg at 05/09/21 6283   furosemide (LASIX) injection 80 mg  80 mg Intravenous Once Ruben Dresser, MD       heparin ADULT infusion 100 units/mL (25000 units/271mL)  1,150 Units/hr Intravenous Continuous Ruben [name], RPH       hydrALAZINE (APRESOLINE) injection 10 mg  10 mg Intravenous Q20 Min PRN Ruben Dresser, MD       labetalol (NORMODYNE) injection 10 mg  10 mg Intravenous Q10 min PRN Ruben Dresser, MD       MEDLINE mouth rinse  15 mL Mouth Rinse BID Ruben [name], [place]   15 mL at 05/09/21 1015   milrinone (PRIMACOR) 20 MG/100 ML (0.2 mg/mL) infusion  0.25 mcg/kg/min Intravenous Continuous Ruben Dresser, MD       multivitamin with minerals tablet 1 tablet  1 tablet Oral Daily Ruben Catching, PA-C   1 tablet at 05/09/21 0950   ondansetron (ZOFRAN) injection 4 mg  4 mg Intravenous Q6H PRN Ruben Dresser, MD       polyethylene glycol (MIRALAX /  GLYCOLAX) packet 17 g  17 g Oral Daily PRN Ruben Catching, PA-C       senna-docusate (Senokot-S) tablet 1 tablet  1 tablet Oral BID Ruben Catching, PA-C   1 tablet at 05/08/21  2154   sodium chloride flush (NS) 0.9 % injection 10-40 mL  10-40 mL Intracatheter Q12H Ruben [name], [place]   10 mL at 05/08/21 2154   sodium chloride flush (NS) 0.9 % injection 10-40 mL  10-40 mL Intracatheter PRN Ruben Catching, PA-C   10 mL at 05/07/21 1148   sodium chloride flush (NS) 0.9 % injection 3 mL  3 mL Intravenous Q12H Ruben [name], [place]   3 mL at 05/08/21 2155   sodium chloride flush (NS) 0.9 % injection 3 mL  3 mL Intravenous PRN Ruben Catching, PA-C       sodium chloride flush (NS) 0.9 % injection 3 mL  3 mL Intravenous Q12H Ruben Dresser, MD       sodium chloride flush (NS) 0.9 % injection 3 mL  3 mL Intravenous PRN Ruben Dresser, MD       thiamine tablet 100 mg  100 mg Oral Daily Ruben [name], [place]   100 mg at 05/09/21 9811    Medications Prior to Admission  Medication Sig Dispense Refill Last Dose   acetaminophen (TYLENOL) 650 MG CR tablet Take 650 mg by mouth every 8 (eight) hours as needed for pain.   Past Week   allopurinol (ZYLOPRIM) 100 MG tablet TAKE 1 TABLET(100 MG) BY MOUTH TWICE DAILY (Patient taking differently: Take 100 mg by mouth 2 (two) times daily.) 180 tablet 1 04/29/2021   aspirin 81 MG tablet Take 81 mg by mouth daily.   04/29/2021   buPROPion (WELLBUTRIN XL) 150 MG 24 hr tablet Take 1 tablet (150 mg total) by mouth every morning. 90 tablet 1 Past Week   cholecalciferol (VITAMIN D3) 25 MCG (1000 UNIT) tablet Take 1,000 Units by mouth daily.   04/15/2021   diclofenac Sodium (VOLTAREN) 1 % GEL Apply 2 g topically 4 (four) times daily. (Patient taking differently: Apply 2 g topically daily as needed (pain).) 100 g 2 02/21/2021   linaCLOtide (LINZESS PO) Take 1 tablet by mouth daily. Not sure about mg; got samples from doctor    04/26/2021   Magnesium 200 MG TABS Take 1 tablet (200 mg total) by mouth daily. With evening meal (Patient taking differently: Take 200 mg by mouth daily.) 30 tablet 2 Past Week   methocarbamol (ROBAXIN) 500 MG tablet TAKE 1 TABLET(500 MG) BY MOUTH EVERY 8 HOURS AS NEEDED FOR MUSCLE SPASMS (Patient taking differently: Take 500 mg by mouth every 8 (eight) hours as needed for muscle spasms.) 20 tablet 1 02/21/2021   olmesartan (BENICAR) 40 MG tablet Take 1 tablet (40 mg total) by mouth daily. 30 tablet 2 04/29/2021   simvastatin (ZOCOR) 40 MG tablet Take 1 tablet (40 mg total) by mouth every evening. 90 tablet 2 04/29/2021   tadalafil (CIALIS) 5 MG tablet Take 1 tablet (5 mg total) by mouth as needed for erectile dysfunction. (Patient taking differently: Take 5 mg by mouth daily as needed for erectile dysfunction.) 30 tablet 2 04/08/2021   albuterol (PROVENTIL) (2.5 MG/3ML) 0.083% nebulizer solution Take 3 mLs (2.5 mg total) by nebulization every 4 (four) hours as needed for wheezing or shortness of breath. (Patient not taking: Reported on 04/29/2021) 75 mL 2 Not Taking   Meloxicam 15 MG TBDP Take 1 tablet by mouth daily. For 5 days then take once a day as needed (Patient not taking: Reported on 04/29/2021) 30 tablet 1 Not Taking   polyethylene glycol powder (GLYCOLAX/MIRALAX) powder Take 17 g by mouth 2 (two) times  daily as needed. (Patient not taking: Reported on 04/29/2021) 578 g 1 Not Taking    Family History  Problem Relation Age of Onset   Heart attack Brother    Diabetes Brother    Heart disease Brother    Cancer Father    Diabetes Brother    Heart disease Brother     Review of Systems:      Cardiac Review of Systems: Y or  [   N ]= no  Chest Pain [  upon admission  ]   Exertional SOB  [ Y ]     Pedal Edema [ N  ]   Syncope  [ N]   Presyncope [ N  ]  General Review of Systems: [Y] = yes [  N]=no Constitional: fatigue [ Y ]; nausea Aqua.Slicker  ]; night sweats [ N ]; fever [ N ]; or chills [   N]                                                                Resp: wheezing[ upon admission ];  hemoptysis[ N ]; GI:   vomiting[ N ];  melena[ N ];  hematochezia Aqua.Slicker  ];  GU:  hematuria[ N ];                Heme/Lymph:  anemia[ Y ];  Neuro: TIA[  N];   stroke[ N ];  vertigo[ N ];  seizures[  ];     Psych: anxiety[ Y ];    Physical Exam: BP 124/81    Pulse (!) 0    Temp 98.2 F (36.8 C) (Oral)    Resp (!) 0    Ht 5\' 11"  (1.803 m)    Wt 77.1 kg    SpO2 (!) 0%    BMI 23.71 kg/m    General appearance: mild distress Head: Normocephalic, without obvious abnormality, atraumatic Neck: supple, symmetrical, trachea midline Resp: Slightly diminished bibasilar breath sounds Cardio: IRRR IRRR GI: Soft, non tender, slightly protuberant, occasional bowel sounds Extremities: No LE edema. Feet warm bilaterally Neurologic: Grossly normal  Diagnostic Studies & Laboratory data:     Recent Radiology Findings:  Echo done 04/30/2021:  IMPRESSIONS    1. Inferior wal hypokinesis . Left ventricular ejection fraction, by  estimation, is 35 to 40%. The left ventricle has moderately decreased function. The left ventricle has no regional wall motion abnormalities.  Left ventricular diastolic parameters are  indeterminate.   2. Right ventricular systolic function is normal. The right ventricular  size is normal.   3. Splay artifact likely ischemic MR given inferior wall motion  abnormality . The mitral valve is abnormal. Moderate to severe mitral valve regurgitation. No evidence of mitral stenosis.   4. The aortic valve is tricuspid. There is mild calcification of the  aortic valve. There is mild thickening of the aortic valve. Aortic valve regurgitation is mild. Aortic valve sclerosis/calcification is present, without any evidence of aortic  stenosis.   5. Aortic dilatation noted. There is mild dilatation of the ascending  aorta, measuring 38 mm.   6. The inferior vena cava is normal in size with  greater than 50%  respiratory variability, suggesting right atrial pressure of 3 mmHg.   FINDINGS   Left Ventricle: Inferior wal  hypokinesis. Left ventricular ejection  fraction, by estimation, is 35 to 40%. The left ventricle has moderately decreased function. The left ventricle has no regional wall motion abnormalities. The left ventricular internal  cavity size was normal in size. There is no left ventricular hypertrophy.  Left ventricular diastolic parameters are indeterminate.   Right Ventricle: The right ventricular size is normal. No increase in  right ventricular wall thickness. Right ventricular systolic function is  normal.   Left Atrium: Left atrial size was normal in size.   Right Atrium: Right atrial size was normal in size.   Pericardium: There is no evidence of pericardial effusion.   Mitral Valve: Splay artifact likely ischemic MR given inferior wall motion abnormality. The mitral valve is abnormal. There is mild thickening of the mitral valve leaflet(s). There is mild calcification of the mitral valve leaflet(s). Moderate to severe  mitral valve regurgitation. No evidence of mitral valve stenosis.   Tricuspid Valve: The tricuspid valve is normal in structure. Tricuspid  valve regurgitation is not demonstrated. No evidence of tricuspid  stenosis.   Aortic Valve: The aortic valve is tricuspid. There is mild calcification  of the aortic valve. There is mild thickening of the aortic valve. Aortic valve regurgitation is mild. Aortic valve sclerosis/calcification is present, without any evidence of  aortic stenosis.   Pulmonic Valve: The pulmonic valve was normal in structure. Pulmonic valve  regurgitation is mild. No evidence of pulmonic stenosis.   Aorta: The aortic root is normal in size and structure and aortic  dilatation noted. There is mild dilatation of the ascending aorta,  measuring 38 mm.   Venous: The inferior vena cava is normal in size with greater than  50%  respiratory variability, suggesting right atrial pressure of 3 mmHg.   IAS/Shunts: No atrial level shunt detected by color flow Doppler.   2.  ECHO TEE  Result Date: 05/07/2021    TRANSESOPHOGEAL ECHO REPORT   Patient Name:   Ruben [name] [facility] Date of Exam: 05/07/2021 Medical Rec #:  [id number]             Height:       71.0 in Accession #:    [id number]            Weight:       180.3 lb Date of Birth:  25-Oct-1945             BSA:          2.018 m Patient Age:    33 years              BP:           126/71 mmHg Patient Gender: M                     HR:           91 bpm. Exam Location:  Inpatient Procedure: 3D Echo, Transesophageal Echo, Cardiac Doppler and Color Doppler Indications:     I34.0 Nonrheumatic mitral (valve) insufficiency  History:         Patient has prior history of Echocardiogram examinations, most                  recent 04/30/2021. Previous Myocardial Infarction and CAD; Risk                  Factors:Dyslipidemia and Current Smoker. Cardiac shock.  Sonographer:     Roseanna Rainbow RDCS Referring Phys:  [place]  Diagnosing Phys: Kirk Ruths McleanMD PROCEDURE: After discussion of the risks and benefits of a TEE, an informed consent was obtained from the patient. The transesophogeal probe was passed without difficulty through the esophogus of the patient. Imaged were obtained with the patient in a left lateral decubitus position. Sedation performed by different physician. The patient was monitored while under deep sedation. Anesthestetic sedation was provided intravenously by Anesthesiology: 330mg  of Propofol, 80mg  of Lidocaine. The patient's vital signs; including heart rate, blood pressure, and oxygen saturation; remained stable throughout the procedure. Supplementary images were obtained from transthoracic windows as indicated to answer the clinical question. The patient developed no complications during the procedure. IMPRESSIONS  1. Left ventricular ejection fraction, by  estimation, is 35 to 40%. The left ventricle has moderately decreased function. The left ventricle demonstrates regional wall motion abnormalities with basal to mid inferior and inferolateral akinesis.  2. Peak RV-RA gradient 37 mmHg. Right ventricular systolic function is normal. The right ventricular size is normal.  3. Left atrial size was mildly dilated. No left atrial/left atrial appendage thrombus was detected.  4. The aortic valve is tricuspid. Aortic valve regurgitation is moderate. No aortic stenosis is present.  5. No PFO or ASD by color doppler.  6. Aortic dilatation noted. There is moderate dilatation of the ascending aorta, measuring 44 mm.  7. The mitral valve is abnormal. Suspect infarct-related MR with PISA ERO 0.27 cm^2 on TEE images with no systolic flow reversal in the pulmonary vein doppler pattern. However, on TTE images, MR looks worse with PISA ERO 0.48 cm^2. Overall, I think that  the MR is more in the moderate range. No evidence of mitral stenosis. FINDINGS  Left Ventricle: Left ventricular ejection fraction, by estimation, is 35 to 40%. The left ventricle has moderately decreased function. The left ventricle demonstrates regional wall motion abnormalities. The left ventricular internal cavity size was normal in size. There is no left ventricular hypertrophy. Right Ventricle: Peak RV-RA gradient 37 mmHg. The right ventricular size is normal. No increase in right ventricular wall thickness. Right ventricular systolic function is normal. Left Atrium: Left atrial size was mildly dilated. No left atrial/left atrial appendage thrombus was detected. Right Atrium: Right atrial size was normal in size. Pericardium: There is no evidence of pericardial effusion. Mitral Valve: The mitral valve is abnormal. Moderate mitral valve regurgitation. No evidence of mitral valve stenosis. Tricuspid Valve: The tricuspid valve is normal in structure. Tricuspid valve regurgitation is trivial. Aortic Valve: The  aortic valve is tricuspid. Aortic valve regurgitation is moderate. No aortic stenosis is present. Pulmonic Valve: The pulmonic valve was normal in structure. Pulmonic valve regurgitation is mild. Aorta: Aortic dilatation noted. There is moderate dilatation of the ascending aorta, measuring 44 mm. IAS/Shunts: No PFO or ASD by color doppler.  MR Peak [name]:    55.5 mmHg    TRICUSPID VALVE MR Mean [name]:    36.5 mmHg    TR Peak [name]:   37.0 mmHg MR Vmax:         372.50 cm/s  TR Vmax:        304.00 cm/s MR Vmean:        280.5 cm/s MR PISA:         2.79 cm MR PISA Eff ROA: 46 mm MR PISA Radius:  0.67 cm Dalton McleanMD Electronically signed by Franki Monte Signature Date/Time: 05/07/2021/3:38:06 PM    Final     3. Cardiac Catheterization by Ruben [name] on 05/09/2021:  Coronary Findings  Diagnostic Dominance:  Right Left Main  Mid LM lesion is 30% stenosed.    Left Anterior Descending  Prox LAD lesion is 60% stenosed.  Mid LAD-1 lesion is 70% stenosed.  Mid LAD-2 lesion is 50% stenosed.  Mid LAD-3 lesion is 50% stenosed.  Dist LAD lesion is 40% stenosed.    First Diagonal Branch  1st Diag lesion is 70% stenosed.    Ramus Intermedius  Ramus lesion is 60% stenosed.    Left Circumflex  Ost Cx to Prox Cx lesion is 40% stenosed.  Prox Cx to Mid Cx lesion is 50% stenosed.  Mid Cx lesion is 80% stenosed.  Dist Cx lesion is 80% stenosed.    Right Coronary Artery  Prox RCA to Mid RCA lesion is 100% stenosed.    Right Posterior Descending Artery  Collaterals  RPDA filled by collaterals from Dist LAD.      Intervention   Intervention  I have independently reviewed the above radiologic studies and discussed with the patient   Recent Lab Findings: Lab Results  Component Value Date   WBC 7.0 05/09/2021   HGB 12.3 (L) 05/09/2021   HCT 36.3 (L) 05/09/2021   PLT 166 05/09/2021   GLUCOSE 415 (H) 05/09/2021   CHOL 160 04/30/2021   TRIG 41 04/30/2021   HDL 59 04/30/2021   LDLCALC 93  04/30/2021   ALT 35 05/06/2021   AST 27 05/06/2021   NA 129 (L) 05/09/2021   K 3.6 05/09/2021   CL 93 (L) 05/09/2021   CREATININE 2.00 (H) 05/09/2021   BUN 22 05/09/2021   CO2 22 05/09/2021   TSH 0.934 04/29/2021   INR 1.1 04/29/2021   HGBA1C 5.7 (H) 04/30/2021    Assessment / Plan:   Atrial fibrillation-on Amiodarone drip and Heparin drip. Likely needs DCCV but will defer to advanced heart failure Acute systolic CHF-CVP 7-8. To be put on a Lasix drip. Advanced heart failure following Cardiogenic shock, s/p MI, coronary artery disease-Cardiac catheterization done today shows multivessel CAD, per Ruben [name], PCI not a good option. On Milrinone drip at 0.25 mcg/kg/hr.  Patient would be very high risk for CABG, +/- AVR and/or MVR;Dr. Cyndia [name] to review all films. Would need more optimization if Dr. Cyndia [name] feels he is a candidate for surgery. Moderate AI, moderate MR (possibly infarct related) by TEE 5. Acute on chronic kidney disease-He has a history of stage IIIb CKD. His creatinine this am down to 2. His creatinine went as high as 3.09 on 12/29 and was 1.68 on admission. 6. History of hyperlipidemia-on Atorvastatin 80 mg daily 7. Pre diabetes-HGA1C 5.7 8. History of tobacco and alcohol abuse 9. Mild dilatation of ascending thoracic aorta-44 mm by TEE  I  spent 15 minutes counseling the patient face to face.   Lars Pinks PA-C 05/09/2021 2:35 PM    Chart reviewed, patient examined, agree with above. This 76 year old gentleman has 3V CAD with an occluded RCA filling by collaterals from the left and presenting with MI, EF 35-40% with acute systolic CHF and cardiogenic shock. His coronaries are diffusely diseased and calcified out to the distal vessels and I don't think he has graftable disease. The LAD and LCX are not critical.  In addition he has moderate AI and moderate MR and continued elevation of right and left heart pressures on milrinone. He has stage 3b CKD with a creatinine  of 2.0 that was as high as 3.1 on 05/02/21. He has a 4.5 cm ascending aortic aneurysm, active smoking and ETOH  abuse. I don't think he is an operative candidate. I discussed this with the patient and his family and they all seem to understand how many problems he has and what poor condition he is in.

## 2021-05-09 NOTE — Plan of Care (Signed)

## 2021-05-09 NOTE — Progress Notes (Addendum)
Came to ambulate however pt feeling very nauseated and declined. Sts it is hard to describe how he feels. Also sts he is quite anxious regarding cath. Discussed antianxiety meds with RN per pt request. Will f/u tomorrow. He has been up in room today. [id number] [place], ACSM 11:26 AM 05/09/2021

## 2021-05-09 NOTE — Interval H&P Note (Signed)
History and Physical Interval Note:  05/09/2021 1:15 PM  Ruben [name]  has presented today for surgery, with the diagnosis of acute systolic heart failure.  The various methods of treatment have been discussed with the patient and family. After consideration of risks, benefits and other options for treatment, the patient has consented to  Procedure(s): RIGHT/LEFT HEART CATH AND CORONARY ANGIOGRAPHY (N/A) as a surgical intervention.  The patient's history has been reviewed, patient examined, no change in status, stable for surgery.  I have reviewed the patient's chart and labs.  Questions were answered to the patient's satisfaction.     Tatia Petrucci [employer]

## 2021-05-09 NOTE — Progress Notes (Addendum)
ANTICOAGULATION CONSULT NOTE  Pharmacy Consult for Heparin Indication: chest pain/ACS  Allergies  Allergen Reactions   Other Other (See Comments)    Pollen - unknown    Patient Measurements: Height: 5\' 11"  (180.3 cm) Weight: 77.1 kg (170 lb) IBW/kg (Calculated) : 75.3 Heparin Dosing Weight: 81.6 kg  Vital Signs: Temp: 98.7 F (37.1 C) (01/05 0300) Temp Source: Oral (01/05 0300) BP: 99/65 (01/05 0730) Pulse Rate: 85 (01/05 0730)  Labs: Recent Labs    05/07/21 0331 05/08/21 0311 05/09/21 0425  HGB 12.1* 11.9* 12.3*  HCT 36.9* 34.4* 36.3*  PLT 158 160 166  HEPARINUNFRC 0.47 0.59 0.68  CREATININE 2.28* 2.19* 2.00*     Estimated Creatinine Clearance: 34 mL/min (A) (by C-G formula based on SCr of 2 mg/dL (H)).   Medical History: Past Medical History:  Diagnosis Date   Arthritis    Chronic kidney disease, stage 3b ([place])    ETOH abuse    History of stress test 09/02/2008   showed inferolateral scar without ischemia   Hx of echocardiogram 09/02/2008   was essentially normal   Hyperlipidemia    Hypertension    Tobacco abuse     Medications:   Infusions:   sodium chloride     sodium chloride     sodium chloride     amiodarone 60 mg/hr (05/09/21 0844)   heparin 1,150 Units/hr (05/08/21 1600)   milrinone 0.125 mcg/kg/min (05/09/21 0243)    Assessment: 89 yoM presenting in respiratory distress. Heparin per pharmacy consult placed for chest pain/ACS, pt also with new onset AF.  Heparin level is therapeutic at 0.68, cath later today.  Goal of Therapy:  Heparin level 0.3-0.7 units/ml Monitor platelets by anticoagulation protocol: Yes   Plan:  Continue IV heparin to 1150 units/hr  Daily heparin level and CBC   ADDENDUM: Pt is s/p LHC, pharmacy to resume heparin in 8h. Sheath removed ~1400.  Plan: Resume heparin no bolus at 2200 at 1150 units/h Check heparin level 8h after restart  Arrie Senate, PharmD, BCPS, [facility]  Pharmacist [phone] Please check AMION for all [place] numbers 05/09/2021

## 2021-05-09 NOTE — Progress Notes (Addendum)
Patient ID: Ruben [name], male   DOB: 02/15/1946, 76 y.o.   MRN: [id number]     Advanced Heart Failure Rounding Note  PCP-Cardiologist: Sanda Klein, MD   Subjective:   05/08/20 Back in A fib and milrinone was cut back to 0.125 mcg.   CO-OX 54% on 0.125 mcg Milrinone.    CVP 7-8   Creatinine 2 today.   Denies SOB.  Objective:   Weight Range: 77.1 kg Body mass index is 23.71 kg/m.   Vital Signs:   Temp:  [98.4 F (36.9 C)-98.7 F (37.1 C)] 98.7 F (37.1 C) (01/05 0300) Pulse Rate:  [75-105] 89 (01/05 0435) Resp:  [9-27] 24 (01/05 0435) BP: (99-127)/(40-77) 120/77 (01/05 0400) SpO2:  [90 %-95 %] 90 % (01/05 0435) Weight:  [77.1 kg] 77.1 kg (01/05 0435) Last BM Date: 05/07/21  Weight change: Filed Weights   05/07/21 1323 05/08/21 0500 05/09/21 0435  Weight: 79.4 kg 77.1 kg 77.1 kg    Intake/Output:   Intake/Output Summary (Last 24 hours) at 05/09/2021 0710 Last data filed at 05/09/2021 0400 Gross per 24 hour  Intake 1330.09 ml  Output 650 ml  Net 680.09 ml      Physical Exam    CVP 7-8  General:   No resp difficulty HEENT: normal Neck: supple. JVP 7-8 . Carotids 2+ bilat; no bruits. No lymphadenopathy or thryomegaly appreciated. Cor: PMI nondisplaced. Irregular rate & rhythm. No rubs, gallops or murmurs. Lungs: clear Abdomen: soft, nontender, nondistended. No hepatosplenomegaly. No bruits or masses. Good bowel sounds. Extremities: no cyanosis, clubbing, rash, edema. RUE PICC Neuro: alert & orientedx3, cranial nerves grossly intact. moves all 4 extremities w/o difficulty. Affect pleasant   Telemetry   A Fib 80-90s   Labs    CBC Recent Labs    05/08/21 0311 05/09/21 0425  WBC 5.7 7.0  HGB 11.9* 12.3*  HCT 34.4* 36.3*  MCV 96.1 94.3  PLT 160 268   Basic Metabolic Panel Recent Labs    05/08/21 0311 05/09/21 0425  NA 135 129*  K 4.0 3.6  CL 102 93*  CO2 24 22  GLUCOSE 146* 415*  BUN 29* 22  CREATININE 2.19* 2.00*  CALCIUM 9.0  8.5*  MG  --  1.9   Liver Function Tests No results for input(s): AST, ALT, ALKPHOS, BILITOT, PROT, ALBUMIN in the last 72 hours.  No results for input(s): LIPASE, AMYLASE in the last 72 hours. Cardiac Enzymes No results for input(s): CKTOTAL, CKMB, CKMBINDEX, TROPONINI in the last 72 hours.  BNP: BNP (last 3 results) Recent Labs    04/29/21 1251  BNP 1,659.6*    ProBNP (last 3 results) No results for input(s): PROBNP in the last 8760 hours.   D-Dimer No results for input(s): DDIMER in the last 72 hours. Hemoglobin A1C No results for input(s): HGBA1C in the last 72 hours. Fasting Lipid Panel No results for input(s): CHOL, HDL, LDLCALC, TRIG, CHOLHDL, LDLDIRECT in the last 72 hours. Thyroid Function Tests No results for input(s): TSH, T4TOTAL, T3FREE, THYROIDAB in the last 72 hours.  Invalid input(s): FREET3  Other results:   Imaging    No results found.   Medications:     Scheduled Medications:  aspirin EC  81 mg Oral Daily   atorvastatin  80 mg Oral Daily   Chlorhexidine Gluconate Cloth  6 each Topical Daily   digoxin  0.0625 mg Oral Daily   folic acid  1 mg Oral Daily   mouth rinse  15 mL Mouth  Rinse BID   multivitamin with minerals  1 tablet Oral Daily   senna-docusate  1 tablet Oral BID   sodium chloride flush  10-40 mL Intracatheter Q12H   sodium chloride flush  3 mL Intravenous Q12H   sodium chloride flush  3 mL Intravenous Q12H   thiamine  100 mg Oral Daily    Infusions:  sodium chloride     sodium chloride     sodium chloride     amiodarone 60 mg/hr (05/09/21 0240)   heparin 1,150 Units/hr (05/08/21 1600)   milrinone 0.125 mcg/kg/min (05/09/21 0243)    PRN Medications: sodium chloride, sodium chloride, ondansetron (ZOFRAN) IV, polyethylene glycol, sodium chloride flush, sodium chloride flush, sodium chloride flush    Assessment/Plan   1. CAD: Suspect delayed presentation LCx (versus RCA) MI.  Echo with EF 35-40%, inferolateral and  basal inferior akinesis, moderate-severe eccentric likely infarct-related MR, normal RV.  He was not cathed initially due to AKI and delayed presentation. No chest pain.  - Continue statin - Continue aspirin  - Cath later today. Starting IV fluids pre cath.  - If no surgical disease, would add Plavix.  2. Acute systolic CHF/cardiogenic shock: Ischemic cardiomyopathy with infarct-related MR. Echo with EF 35-40%, inferolateral and basal inferior akinesis, moderate-severe eccentric likely infarct-related MR, normal RV.  Echo does not look bad enough to explain low output HF, concerned that MR may be playing a role.  TEE on 01/03 with EF 35-40%, moderate MR (suspect infarct-related), aortic dilatation measuring 44 mm, moderate AI.  - Co-ox 54% on 0.125 milrinone. Repeat CO-OX now. Stop milrinone prior to cath - CVP 7-8. No diuretics.  - GDMT limited by AKI - No beta blocker for now with recent shock 3. Hyponatremia: 129 restrict po fluid 4. Valvular heart disease: -MR moderate in severity on TEE. Likely infarct related. Moderate AI. EF 35-40% 5. Atrial fibrillation: First noted this admission.  Remains in A fib. Continue amio drip.  - Continue heparin gtt.  - Will  DCCV tomorrow if does not convert off milrinone.  6. AKI on CKD 3: Baseline creatinine around 1.4. Up to 3.09, now coming down 2.00 today.   - Continue to support cardiac output. 7. Active smoker: Needs to quit.  8. ETOH abuse: Needs to cut back.  9. Thrombocytopenia: Mild, pre-dates heparin initiation. Resolved.  Stop milrinone pre cath. Give IV fluid prior to cath.    Length of Stay: [place], NP  05/09/2021, 7:10 AM  Advanced Heart Failure Team Pager [phone] (M-F; 7a - 5p)  Please contact [facility] for night-coverage after hours (5p -7a ) and weekends on [email or web address]  Patient seen with NP, agree with the above note.   Early am co-ox 54% today on milrinone 0.125.  CVP 7-8.  He remains in atrial fibrillation on  amiodarone gtt and heparin gtt.   General: NAD Neck: No JVD, no thyromegaly or thyroid nodule.  Lungs: Clear to auscultation bilaterally with normal respiratory effort. CV: Nondisplaced PMI.  Heart regular S1/S2, no S3/S4, 1/6 HSM apex.  No peripheral edema.  N Abdomen: Soft, nontender, no hepatosplenomegaly, no distention.  Skin: Intact without lesions or rashes.  Neurologic: Alert and oriented x 3.  Psych: Normal affect. Extremities: No clubbing or cyanosis.  HEENT: Normal.   Creatinine gradually trending down.  Plan cath today as above with pre-hydration.  Will need to limit contrast as much as possible.  Will stop milrinone prior to get CO while off milrinone.   No  diuretic today.   Continue amiodarone gtt/heparin gtt.  Will need eventual DCCV (tomorrow) if he does not convert on his own.   Loralie Champagne 05/09/2021 8:38 AM

## 2021-05-09 NOTE — Progress Notes (Signed)
Inpatient Diabetes Program Recommendations  AACE/ADA: New Consensus Statement on Inpatient Glycemic Control   Target Ranges:  Prepandial:   less than 140 mg/dL      Peak postprandial:   less than 180 mg/dL (1-2 hours)      Critically ill patients:  140 - 180 mg/dL    Latest Reference Range & Units 05/09/21 04:25  Glucose 70 - 99 mg/dL 415 (H)    Latest Reference Range & Units 05/08/21 21:11  Glucose-Capillary 70 - 99 mg/dL 124 (H)   Review of Glycemic Control  Diabetes history: No Outpatient Diabetes medications: NA Current orders for Inpatient glycemic control: None  Inpatient Diabetes Program Recommendations:  May want to consider ordering CBGs with Novolog 0-6 units TID with meals for correction if needed.  Thanks, Barnie Alderman, RN, MSN, CDE Diabetes Coordinator Inpatient Diabetes Program [phone] (Team Pager from 8am to 5pm)

## 2021-05-09 NOTE — H&P (View-Only) (Signed)
Patient ID: Ruben [name], male   DOB: 18-Dec-1945, 76 y.o.   MRN: [id number]     Advanced Heart Failure Rounding Note  PCP-Cardiologist: Sanda Klein, MD   Subjective:   05/08/20 Back in A fib and milrinone was cut back to 0.125 mcg.   CO-OX 54% on 0.125 mcg Milrinone.    CVP 7-8   Creatinine 2 today.   Denies SOB.  Objective:   Weight Range: 77.1 kg Body mass index is 23.71 kg/m.   Vital Signs:   Temp:  [98.4 F (36.9 C)-98.7 F (37.1 C)] 98.7 F (37.1 C) (01/05 0300) Pulse Rate:  [75-105] 89 (01/05 0435) Resp:  [9-27] 24 (01/05 0435) BP: (99-127)/(40-77) 120/77 (01/05 0400) SpO2:  [90 %-95 %] 90 % (01/05 0435) Weight:  [77.1 kg] 77.1 kg (01/05 0435) Last BM Date: 05/07/21  Weight change: Filed Weights   05/07/21 1323 05/08/21 0500 05/09/21 0435  Weight: 79.4 kg 77.1 kg 77.1 kg    Intake/Output:   Intake/Output Summary (Last 24 hours) at 05/09/2021 0710 Last data filed at 05/09/2021 0400 Gross per 24 hour  Intake 1330.09 ml  Output 650 ml  Net 680.09 ml      Physical Exam    CVP 7-8  General:   No resp difficulty HEENT: normal Neck: supple. JVP 7-8 . Carotids 2+ bilat; no bruits. No lymphadenopathy or thryomegaly appreciated. Cor: PMI nondisplaced. Irregular rate & rhythm. No rubs, gallops or murmurs. Lungs: clear Abdomen: soft, nontender, nondistended. No hepatosplenomegaly. No bruits or masses. Good bowel sounds. Extremities: no cyanosis, clubbing, rash, edema. RUE PICC Neuro: alert & orientedx3, cranial nerves grossly intact. moves all 4 extremities w/o difficulty. Affect pleasant   Telemetry   A Fib 80-90s   Labs    CBC Recent Labs    05/08/21 0311 05/09/21 0425  WBC 5.7 7.0  HGB 11.9* 12.3*  HCT 34.4* 36.3*  MCV 96.1 94.3  PLT 160 622   Basic Metabolic Panel Recent Labs    05/08/21 0311 05/09/21 0425  NA 135 129*  K 4.0 3.6  CL 102 93*  CO2 24 22  GLUCOSE 146* 415*  BUN 29* 22  CREATININE 2.19* 2.00*  CALCIUM 9.0  8.5*  MG  --  1.9   Liver Function Tests No results for input(s): AST, ALT, ALKPHOS, BILITOT, PROT, ALBUMIN in the last 72 hours.  No results for input(s): LIPASE, AMYLASE in the last 72 hours. Cardiac Enzymes No results for input(s): CKTOTAL, CKMB, CKMBINDEX, TROPONINI in the last 72 hours.  BNP: BNP (last 3 results) Recent Labs    04/29/21 1251  BNP 1,659.6*    ProBNP (last 3 results) No results for input(s): PROBNP in the last 8760 hours.   D-Dimer No results for input(s): DDIMER in the last 72 hours. Hemoglobin A1C No results for input(s): HGBA1C in the last 72 hours. Fasting Lipid Panel No results for input(s): CHOL, HDL, LDLCALC, TRIG, CHOLHDL, LDLDIRECT in the last 72 hours. Thyroid Function Tests No results for input(s): TSH, T4TOTAL, T3FREE, THYROIDAB in the last 72 hours.  Invalid input(s): FREET3  Other results:   Imaging    No results found.   Medications:     Scheduled Medications:  aspirin EC  81 mg Oral Daily   atorvastatin  80 mg Oral Daily   Chlorhexidine Gluconate Cloth  6 each Topical Daily   digoxin  0.0625 mg Oral Daily   folic acid  1 mg Oral Daily   mouth rinse  15 mL Mouth  Rinse BID   multivitamin with minerals  1 tablet Oral Daily   senna-docusate  1 tablet Oral BID   sodium chloride flush  10-40 mL Intracatheter Q12H   sodium chloride flush  3 mL Intravenous Q12H   sodium chloride flush  3 mL Intravenous Q12H   thiamine  100 mg Oral Daily    Infusions:  sodium chloride     sodium chloride     sodium chloride     amiodarone 60 mg/hr (05/09/21 0240)   heparin 1,150 Units/hr (05/08/21 1600)   milrinone 0.125 mcg/kg/min (05/09/21 0243)    PRN Medications: sodium chloride, sodium chloride, ondansetron (ZOFRAN) IV, polyethylene glycol, sodium chloride flush, sodium chloride flush, sodium chloride flush    Assessment/Plan   1. CAD: Suspect delayed presentation LCx (versus RCA) MI.  Echo with EF 35-40%, inferolateral and  basal inferior akinesis, moderate-severe eccentric likely infarct-related MR, normal RV.  He was not cathed initially due to AKI and delayed presentation. No chest pain.  - Continue statin - Continue aspirin  - Cath later today. Starting IV fluids pre cath.  - If no surgical disease, would add Plavix.  2. Acute systolic CHF/cardiogenic shock: Ischemic cardiomyopathy with infarct-related MR. Echo with EF 35-40%, inferolateral and basal inferior akinesis, moderate-severe eccentric likely infarct-related MR, normal RV.  Echo does not look bad enough to explain low output HF, concerned that MR may be playing a role.  TEE on 01/03 with EF 35-40%, moderate MR (suspect infarct-related), aortic dilatation measuring 44 mm, moderate AI.  - Co-ox 54% on 0.125 milrinone. Repeat CO-OX now. Stop milrinone prior to cath - CVP 7-8. No diuretics.  - GDMT limited by AKI - No beta blocker for now with recent shock 3. Hyponatremia: 129 restrict po fluid 4. Valvular heart disease: -MR moderate in severity on TEE. Likely infarct related. Moderate AI. EF 35-40% 5. Atrial fibrillation: First noted this admission.  Remains in A fib. Continue amio drip.  - Continue heparin gtt.  - Will  DCCV tomorrow if does not convert off milrinone.  6. AKI on CKD 3: Baseline creatinine around 1.4. Up to 3.09, now coming down 2.00 today.   - Continue to support cardiac output. 7. Active smoker: Needs to quit.  8. ETOH abuse: Needs to cut back.  9. Thrombocytopenia: Mild, pre-dates heparin initiation. Resolved.  Stop milrinone pre cath. Give IV fluid prior to cath.    Length of Stay: [place], NP  05/09/2021, 7:10 AM  Advanced Heart Failure Team Pager [phone] (M-F; 7a - 5p)  Please contact [facility] for night-coverage after hours (5p -7a ) and weekends on [email or web address]  Patient seen with NP, agree with the above note.   Early am co-ox 54% today on milrinone 0.125.  CVP 7-8.  He remains in atrial fibrillation on  amiodarone gtt and heparin gtt.   General: NAD Neck: No JVD, no thyromegaly or thyroid nodule.  Lungs: Clear to auscultation bilaterally with normal respiratory effort. CV: Nondisplaced PMI.  Heart regular S1/S2, no S3/S4, 1/6 HSM apex.  No peripheral edema.  N Abdomen: Soft, nontender, no hepatosplenomegaly, no distention.  Skin: Intact without lesions or rashes.  Neurologic: Alert and oriented x 3.  Psych: Normal affect. Extremities: No clubbing or cyanosis.  HEENT: Normal.   Creatinine gradually trending down.  Plan cath today as above with pre-hydration.  Will need to limit contrast as much as possible.  Will stop milrinone prior to get CO while off milrinone.   No  diuretic today.   Continue amiodarone gtt/heparin gtt.  Will need eventual DCCV (tomorrow) if he does not convert on his own.   Loralie Champagne 05/09/2021 8:38 AM

## 2021-05-10 ENCOUNTER — Encounter (HOSPITAL_COMMUNITY): Payer: Self-pay | Admitting: Cardiology

## 2021-05-10 DIAGNOSIS — R570 Cardiogenic shock: Secondary | ICD-10-CM | POA: Diagnosis not present

## 2021-05-10 LAB — BASIC METABOLIC PANEL
Anion gap: 12 (ref 5–15)
BUN: 23 mg/dL (ref 8–23)
CO2: 24 mmol/L (ref 22–32)
Calcium: 8.5 mg/dL — ABNORMAL LOW (ref 8.9–10.3)
Chloride: 93 mmol/L — ABNORMAL LOW (ref 98–111)
Creatinine, Ser: 2.2 mg/dL — ABNORMAL HIGH (ref 0.61–1.24)
GFR, Estimated: 30 mL/min — ABNORMAL LOW (ref >60)
Glucose, Bld: 297 mg/dL — ABNORMAL HIGH (ref 70–99)
Potassium: 4 mmol/L (ref 3.5–5.1)
Sodium: 129 mmol/L — ABNORMAL LOW (ref 135–145)

## 2021-05-10 LAB — CBC
HCT: 35.4 % — ABNORMAL LOW (ref 39.0–52.0)
Hemoglobin: 12.2 g/dL — ABNORMAL LOW (ref 13.0–17.0)
MCH: 32.9 pg (ref 26.0–34.0)
MCHC: 34.5 g/dL (ref 30.0–36.0)
MCV: 95.4 fL (ref 80.0–100.0)
Platelets: 162 10*3/uL (ref 150–400)
RBC: 3.71 MIL/uL — ABNORMAL LOW (ref 4.22–5.81)
RDW: 13.7 % (ref 11.5–15.5)
WBC: 8.6 10*3/uL (ref 4.0–10.5)
nRBC: 0 % (ref 0.0–0.2)

## 2021-05-10 LAB — HEPARIN LEVEL (UNFRACTIONATED): Heparin Unfractionated: 0.34 IU/mL (ref 0.30–0.70)

## 2021-05-10 LAB — GLUCOSE, CAPILLARY
Glucose-Capillary: 112 mg/dL — ABNORMAL HIGH (ref 70–99)
Glucose-Capillary: 146 mg/dL — ABNORMAL HIGH (ref 70–99)
Glucose-Capillary: 114 mg/dL — ABNORMAL HIGH (ref 70–99)

## 2021-05-10 LAB — COOXEMETRY PANEL
Carboxyhemoglobin: 0.7 % (ref 0.5–1.5)
Methemoglobin: 0.9 % (ref 0.0–1.5)
O2 Saturation: 68.1 %
Total hemoglobin: 12.2 g/dL (ref 12.0–16.0)

## 2021-05-10 MED ORDER — INSULIN ASPART 100 UNIT/ML IJ SOLN
0.0000 [IU] | Freq: Three times a day (TID) | INTRAMUSCULAR | Status: DC
  Administered 2021-05-10 – 2021-05-14 (×2): 1 [IU] via SUBCUTANEOUS

## 2021-05-10 MED ORDER — APIXABAN 5 MG PO TABS
5.0000 mg | ORAL_TABLET | Freq: Two times a day (BID) | ORAL | Status: DC
  Administered 2021-05-10 – 2021-05-15 (×11): 5 mg via ORAL
  Filled 2021-05-10 (×11): qty 1

## 2021-05-10 MED ORDER — POTASSIUM CHLORIDE CRYS ER 20 MEQ PO TBCR
40.0000 meq | EXTENDED_RELEASE_TABLET | Freq: Once | ORAL | Status: AC
  Administered 2021-05-10: 40 meq via ORAL
  Filled 2021-05-10: qty 2

## 2021-05-10 MED ORDER — INSULIN ASPART 100 UNIT/ML IJ SOLN: 0.0000 [IU] | Freq: Every day | INTRAMUSCULAR | Status: DC

## 2021-05-10 MED FILL — Amiodarone HCl Inj 150 MG/3ML (50 MG/ML): INTRAVENOUS | Qty: 3 | Status: AC

## 2021-05-10 NOTE — TOC CM/SW Note (Signed)
HF TOC CM reviewed chart, pt may need oxygen at time of dc. Possible HH vs CIR. Waiting PT recommendations. [place], Heart Failure TOC CM [phone]

## 2021-05-10 NOTE — Progress Notes (Addendum)
Patient ID: Ruben [name], male   DOB: Aug 25, 1945, 76 y.o.   MRN: [id number]     Advanced Heart Failure Rounding Note  PCP-Cardiologist: Sanda Klein, MD   Subjective:   12/26: Admit with late presentation MI. New AF 12/28: CGS initial co-ox 47%. Started NE + mil. 01/03: SR 05/08/20 Back in A fib and milrinone was cut back to 0.125 mcg.  05/09/20 Milrinone increased back to 0.25. Volume overload, elevated filling pressures on RHC with preserved CO on milrinone. Hypertensive. Back in ICU on nitro gtt  LHC: occluded RCA (culprit), diffuse disease in LAD and Lcx.   Seen by CVTS. Not felt to be a candidate for surgery.   Co-ox 68% on 0.25 milrinone  Nitro gtt now off.   Diuresing with lasix gtt at 8/hr. CVP 11.  Dyspnea significantly improved overnight. No CP.  Scr 2.0>2.2  Na 135>129  Remains in AF on amio gtt, rate 70s-80s  Objective:   Weight Range: 77.9 kg Body mass index is 23.95 kg/m.   Vital Signs:   Temp:  [97.8 F (36.6 C)-98.4 F (36.9 C)] 97.9 F (36.6 C) (01/06 0700) Pulse Rate:  [0-142] 83 (01/06 0700) Resp:  [0-92] 21 (01/06 0700) BP: (53-197)/(37-111) 96/63 (01/06 0700) SpO2:  [0 %-98 %] 94 % (01/06 0700) Weight:  [77.9 kg] 77.9 kg (01/06 0500) Last BM Date: 05/07/21  Weight change: Filed Weights   05/08/21 0500 05/09/21 0435 05/10/21 0500  Weight: 77.1 kg 77.1 kg 77.9 kg    Intake/Output:   Intake/Output Summary (Last 24 hours) at 05/10/2021 0911 Last data filed at 05/10/2021 0600 Gross per 24 hour  Intake 1058.85 ml  Output 2005 ml  Net -946.15 ml      Physical Exam    CVP 11 General:  Sitting up on side of bed, no distress. On 2L O2 [place] HEENT: normal Neck: supple. JVP 10-12. Carotids 2+ bilat; no bruits. No lymphadenopathy or thryomegaly appreciated. Cor: PMI nondisplaced. Regular rate & rhythm. No rubs, gallops, 2/6 holosystolic murmur Lungs: bibasilar crackles Abdomen: soft, nontender, nondistended. No hepatosplenomegaly. No  bruits or masses. Good bowel sounds. Extremities: no cyanosis, clubbing, rash, edema, + RUE PICC Neuro: alert & orientedx3, cranial nerves grossly intact. moves all 4 extremities w/o difficulty. Affect pleasant    Telemetry   AF 70s-80s  Labs    CBC Recent Labs    05/09/21 0425 05/09/21 1340 05/10/21 0455  WBC 7.0  --  8.6  HGB 12.3* 14.3   14.3 12.2*  HCT 36.3* 42.0   42.0 35.4*  MCV 94.3  --  95.4  PLT 166  --  626   Basic Metabolic Panel Recent Labs    05/09/21 0425 05/09/21 1340 05/10/21 0455  NA 129* 135   135 129*  K 3.6 4.4   4.3 4.0  CL 93*  --  93*  CO2 22  --  24  GLUCOSE 415*  --  297*  BUN 22  --  23  CREATININE 2.00*  --  2.20*  CALCIUM 8.5*  --  8.5*  MG 1.9  --   --    Liver Function Tests No results for input(s): AST, ALT, ALKPHOS, BILITOT, PROT, ALBUMIN in the last 72 hours.  No results for input(s): LIPASE, AMYLASE in the last 72 hours. Cardiac Enzymes No results for input(s): CKTOTAL, CKMB, CKMBINDEX, TROPONINI in the last 72 hours.  BNP: BNP (last 3 results) Recent Labs    04/29/21 1251  BNP 1,659.6*  ProBNP (last 3 results) No results for input(s): PROBNP in the last 8760 hours.   D-Dimer No results for input(s): DDIMER in the last 72 hours. Hemoglobin A1C No results for input(s): HGBA1C in the last 72 hours. Fasting Lipid Panel No results for input(s): CHOL, HDL, LDLCALC, TRIG, CHOLHDL, LDLDIRECT in the last 72 hours. Thyroid Function Tests No results for input(s): TSH, T4TOTAL, T3FREE, THYROIDAB in the last 72 hours.  Invalid input(s): FREET3  Other results:   Imaging    CARDIAC CATHETERIZATION  Result Date: 05/09/2021   Prox RCA to Mid RCA lesion is 100% stenosed.   Ost Cx to Prox Cx lesion is 40% stenosed.   Prox Cx to Mid Cx lesion is 50% stenosed.   Mid Cx lesion is 80% stenosed.   Dist Cx lesion is 80% stenosed.   Mid LM lesion is 30% stenosed.   Prox LAD lesion is 60% stenosed.   1st Diag lesion is 70%  stenosed.   Mid LAD-1 lesion is 70% stenosed.   Mid LAD-2 lesion is 50% stenosed.   Mid LAD-3 lesion is 50% stenosed.   Dist LAD lesion is 40% stenosed.   Ramus lesion is 60% stenosed. 1. Elevated left and right heart filling pressures, preserved cardiac output on milrinone 0.25. 2. Occluded RCA, this is cause of his out of hospital MI prior to admission.  Collaterals from the left system. 3. Diffuse moderate disease throughout the LAD and LCx.  Suspect the totality of disease would lead to ischemia in both territories.  No good PCI targets given diffuse disease. Options for revascularization will likely be limited.  No PCI target.  Will ask for TCTS opinion on CABG, but with CHF and renal failure he would be a difficult candidate.   DG CHEST PORT 1 VIEW  Result Date: 05/09/2021 CLINICAL DATA:  Shortness of breath and chest pain. EXAM: PORTABLE CHEST 1 VIEW COMPARISON:  Chest x-ray 05/01/2021. FINDINGS: Right upper extremity PICC terminates over the mid SVC, unchanged. The cardiac silhouette is mildly enlarged, unchanged. There is central pulmonary vascular congestion and central and lower lung interstitial opacities which have increased from prior. There is scratch at there are new small bilateral pleural effusions. There is no evidence for pneumothorax or acute fracture. IMPRESSION: 1. Cardiomegaly with new mild interstitial edema and small pleural effusions. Electronically Signed   By: Ronney Asters M.D.   On: 05/09/2021 15:28     Medications:     Scheduled Medications:  aspirin EC  81 mg Oral Daily   atorvastatin  80 mg Oral Daily   Chlorhexidine Gluconate Cloth  6 each Topical Daily   digoxin  0.0625 mg Oral Daily   folic acid  1 mg Oral Daily   furosemide  80 mg Intravenous Once   mouth rinse  15 mL Mouth Rinse BID   multivitamin with minerals  1 tablet Oral Daily   potassium chloride  40 mEq Oral BID   senna-docusate  1 tablet Oral BID   sodium chloride flush  10-40 mL Intracatheter Q12H    sodium chloride flush  3 mL Intravenous Q12H   sodium chloride flush  3 mL Intravenous Q12H   thiamine  100 mg Oral Daily    Infusions:  sodium chloride     sodium chloride     amiodarone 60 mg/hr (05/10/21 0400)   furosemide (LASIX) 200 mg in dextrose 5% 100 mL (2mg /mL) infusion 8 mg/hr (05/10/21 0400)   heparin 1,150 Units/hr (05/10/21 0400)   milrinone  0.25 mcg/kg/min (05/10/21 0000)   nitroGLYCERIN Stopped (05/09/21 2313)    PRN Medications: sodium chloride, sodium chloride, acetaminophen, ALPRAZolam, ondansetron (ZOFRAN) IV, polyethylene glycol, sodium chloride flush, sodium chloride flush, sodium chloride flush    Assessment/Plan   1. CAD: Suspect delayed presentation LCx (versus RCA) MI.  Echo with EF 35-40%, inferolateral and basal inferior akinesis, moderate-severe eccentric likely infarct-related MR, normal RV.  He was not cathed initially due to AKI and delayed presentation. No chest pain.  - LHC with occluded RCA (culprit), diffuse moderate disease in LAD and Lcx. No good PCI targets. Not a candidate for CABG. - Continue statin - Continue aspirin 2. Acute systolic CHF/cardiogenic shock: Ischemic cardiomyopathy with infarct-related MR. Echo with EF 35-40%, inferolateral and basal inferior akinesis, moderate-severe eccentric likely infarct-related MR, normal RV.  Echo does not look bad enough to explain low output HF, concerned that MR may be playing a role.  TEE on 01/03 with EF 35-40%, moderate MR (suspect infarct-related), aortic dilatation measuring 44 mm, moderate AI.  - RHC on 0.25 mil: RA mean 16, PA 66/26 (41), PCWP 27, PA 62%, Fick CO 5.04/ CI 2.56  - Co-ox 68% on 0.25 mil - CVP 10-11. Continue diuresing with lasix gtt at 8/hr.  - GDMT limited by AKI - Continue digoxin 0.0625.  - No beta blocker for now with recent shock 3. Hyponatremia: 129 restrict po fluid, diurese today - Monitor 4. Valvular heart disease: -MR moderate in severity on TEE. Likely infarct  related. Moderate AI. EF 35-40% 5. Atrial fibrillation: First noted this admission.  Remains in A fib. Continue amio drip, decrease to 30/hr.  - Continue heparin gtt.  - Consider cardioversion once off milrinone 6. AKI on CKD 3: Baseline creatinine around 1.4. Up to 3.09, now 2.2. Diurese. - Continue to support cardiac output. 7. Active smoker: Needs to quit.  8. ETOH abuse: Needs to cut back.  9. Thrombocytopenia: Mild, pre-dates heparin initiation. Resolved. 10. Hyperglycemia: - Start SSI  - A1c 5.7  Length of Stay: Cecil, LINDSAY N, PA-C  05/10/2021, 9:11 AM  Advanced Heart Failure Team Pager [phone] (M-F; 7a - 5p)  Please contact [facility] for night-coverage after hours (5p -7a ) and weekends on [email or web address]  Patient seen with PA, agree with the above note.   Co-ox 68.1%, CVP 8 now on my read.  He is on milrinone 0.25 with Lasix 6 mg/hr.  Creatinine mildly higher 2.2.    Remains in AF on amiodarone gtt and heparin gtt.   He has walked in the hall.   General: NAD Neck: JVP 8 cm, no thyromegaly or thyroid nodule.  Lungs: Clear to auscultation bilaterally with normal respiratory effort. CV: Nondisplaced PMI.  Heart irregular S1/S2, no S3/S4, no murmur.  No peripheral edema.   Abdomen: Soft, nontender, no hepatosplenomegaly, no distention.  Skin: Intact without lesions or rashes.  Neurologic: Alert and oriented x 3.  Psych: Normal affect. Extremities: No clubbing or cyanosis.  HEENT: Normal.   Patient seen by Dr. Cyndia Bent, not operative candidate (does not appear to have graftable disease).  Disease in LAD and LCx is not critical.  - Continue statin.  - Will start Eliquis so can stop ASA.   Looks much better in terms of breathing today.  CVP 11-12 earlier, now down to 8.  Co-ox 68.1%.  Continue milrinone 0.25 today, can cut back on Lasix to 6 mg/hr.   - Start to wean milrinone tomorrow if creatinine and co-ox remain stable.  -  Probably can stop IV diuresis  tomorrow.   He remains in AF.  Rate is controlled.  - Transition off heparin gtt and onto Eliquis today, no surgery planned.  - Decrease amiodarone to 30 mg/hr.  - Aim for TEE-DCCV Monday, hopefully after milrinone weaned off.   Infarct-related MR is probably moderate to moderate-severe.  Follow closely over time.   CRITICAL CARE Performed by: Loralie Champagne  Total critical care time: 40 minutes  Critical care time was exclusive of separately billable procedures and treating other patients.  Critical care was necessary to treat or prevent imminent or life-threatening deterioration.  Critical care was time spent personally by me on the following activities: development of treatment plan with patient and/or surrogate as well as nursing, discussions with consultants, evaluation of patient's response to treatment, examination of patient, obtaining history from patient or surrogate, ordering and performing treatments and interventions, ordering and review of laboratory studies, ordering and review of radiographic studies, pulse oximetry and re-evaluation of patient's condition.  Loralie Champagne 05/10/2021 12:05 PM

## 2021-05-10 NOTE — Progress Notes (Signed)
CARDIAC REHAB PHASE I   PRE:  Rate/Rhythm: 87 afib    BP: sitting 107/72    SaO2: 96 2L, 87-90 RA  MODE:  Ambulation: 370 ft   POST:  Rate/Rhythm: 112 afib    BP: sitting 103/61     SaO2: 93 2L  Pt with min assist moving to EOB. Attempted RA however desat to 87-90 RA. Ambulated with standby assist and 2L, slow and steady. No major c/o, denied SOB and nausea. To recliner, VSS.  [phone]  Jordan, ACSM 05/10/2021 10:17 AM

## 2021-05-10 NOTE — Progress Notes (Addendum)
[place] for Heparin Indication: chest pain/ACS  Allergies  Allergen Reactions   Other Other (See Comments)    Pollen - unknown    Patient Measurements: Height: 5\' 11"  (180.3 cm) Weight: 77.9 kg (171 lb 11.8 oz) IBW/kg (Calculated) : 75.3 Heparin Dosing Weight: 81.6 kg  Vital Signs: Temp: 97.9 F (36.6 C) (01/06 0700) Temp Source: Oral (01/06 0700) BP: 96/63 (01/06 0700) Pulse Rate: 83 (01/06 0700)  Labs: Recent Labs    05/08/21 0311 05/09/21 0425 05/09/21 1340 05/10/21 0455  HGB 11.9* 12.3* 14.3   14.3 12.2*  HCT 34.4* 36.3* 42.0   42.0 35.4*  PLT 160 166  --  162  HEPARINUNFRC 0.59 0.68  --  0.34  CREATININE 2.19* 2.00*  --  2.20*     Estimated Creatinine Clearance: 30.9 mL/min (A) (by C-G formula based on SCr of 2.2 mg/dL (H)).   Medical History: Past Medical History:  Diagnosis Date   Arthritis    Chronic kidney disease, stage 3b ([place])    ETOH abuse    History of stress test 09/02/2008   showed inferolateral scar without ischemia   Hx of echocardiogram 09/02/2008   was essentially normal   Hyperlipidemia    Hypertension    Tobacco abuse      Assessment: 46 yoM presenting in respiratory distress. Heparin per pharmacy consult placed for chest pain/ACS, pt also with new onset AF.  Heparin resumed post/cath yesterday, heparin level therapeutic at 0.34, should drift up further. CBC is stable.  Goal of Therapy:  Heparin level 0.3-0.7 units/ml Monitor platelets by anticoagulation protocol: Yes   Plan:  Continue IV heparin to 1150 units/hr  Daily heparin level and CBC  ADDENDUM: switching to apixaban. DCCV Monday.  Plan: Stop heparin Apixaban 5mg  BID   Arrie Senate, PharmD, BCPS, [facility] Clinical Pharmacist [phone] Please check AMION for all [facility] numbers 05/10/2021

## 2021-05-11 DIAGNOSIS — R570 Cardiogenic shock: Secondary | ICD-10-CM | POA: Diagnosis not present

## 2021-05-11 LAB — BASIC METABOLIC PANEL
Anion gap: 8 (ref 5–15)
BUN: 26 mg/dL — ABNORMAL HIGH (ref 8–23)
CO2: 25 mmol/L (ref 22–32)
Calcium: 8.6 mg/dL — ABNORMAL LOW (ref 8.9–10.3)
Chloride: 98 mmol/L (ref 98–111)
Creatinine, Ser: 2.6 mg/dL — ABNORMAL HIGH (ref 0.61–1.24)
GFR, Estimated: 25 mL/min — ABNORMAL LOW (ref >60)
Glucose, Bld: 160 mg/dL — ABNORMAL HIGH (ref 70–99)
Potassium: 4 mmol/L (ref 3.5–5.1)
Sodium: 131 mmol/L — ABNORMAL LOW (ref 135–145)

## 2021-05-11 LAB — GLUCOSE, CAPILLARY
Glucose-Capillary: 111 mg/dL — ABNORMAL HIGH (ref 70–99)
Glucose-Capillary: 101 mg/dL — ABNORMAL HIGH (ref 70–99)

## 2021-05-11 LAB — MAGNESIUM: Magnesium: 1.9 mg/dL (ref 1.7–2.4)

## 2021-05-11 LAB — CBC
HCT: 35.5 % — ABNORMAL LOW (ref 39.0–52.0)
Hemoglobin: 11.8 g/dL — ABNORMAL LOW (ref 13.0–17.0)
MCH: 31.5 pg (ref 26.0–34.0)
MCHC: 33.2 g/dL (ref 30.0–36.0)
MCV: 94.7 fL (ref 80.0–100.0)
Platelets: 159 10*3/uL (ref 150–400)
RBC: 3.75 MIL/uL — ABNORMAL LOW (ref 4.22–5.81)
RDW: 13.7 % (ref 11.5–15.5)
WBC: 6.5 10*3/uL (ref 4.0–10.5)
nRBC: 0 % (ref 0.0–0.2)

## 2021-05-11 LAB — COOXEMETRY PANEL
Carboxyhemoglobin: 1.1 % (ref 0.5–1.5)
Methemoglobin: 0.7 % (ref 0.0–1.5)
O2 Saturation: 63.3 %
Total hemoglobin: 13.3 g/dL (ref 12.0–16.0)

## 2021-05-11 MED ORDER — POTASSIUM CHLORIDE CRYS ER 20 MEQ PO TBCR: 40.0000 meq | EXTENDED_RELEASE_TABLET | Freq: Once | ORAL | Status: DC

## 2021-05-11 NOTE — Progress Notes (Signed)
Patient ID: Ruben [name], male   DOB: [birth year], 76 y.o.   MRN: [id number]     Advanced Heart Failure Rounding Note  PCP-Cardiologist: Sanda Klein, MD   Subjective:   12/26: Admit with late presentation MI. New AF 12/28: CGS initial co-ox 47%. Started NE + mil. 01/03: SR 05/08/20 Back in A fib and milrinone was cut back to 0.125 mcg.  05/09/20 Milrinone increased back to 0.25. Volume overload, elevated filling pressures on RHC with preserved CO on milrinone. Hypertensive. Back in ICU on nitro gtt  LHC 1/5: occluded RCA (culprit), diffuse disease in LAD and Lcx. Seen by CVTS. Not felt to be a candidate for surgery.   On milrinone 0.25 Co-ox 63% Lasix gtt dropped 8 -> 6 yesterday  CVP 5 today  Scr 2.0>2.2 > 2.6  Na 135>129 > 131  Remains in AF on amio gtt, rate 70s-80s. On apixaban  Feels weak. Denies SOB, orthopnea or PND.   Objective:   Weight Range: 76.8 kg Body mass index is 23.61 kg/m.   Vital Signs:   Temp:  [97.8 F (36.6 C)-98.3 F (36.8 C)] 97.8 F (36.6 C) (01/07 4627) Pulse Rate:  [74-94] 86 (01/07 1000) Resp:  [13-26] 21 (01/07 1000) BP: (91-121)/(47-71) 109/59 (01/07 1000) SpO2:  [83 %-96 %] 96 % (01/07 1000) Weight:  [76.8 kg] 76.8 kg (01/07 0600) Last BM Date: 05/11/21  Weight change: Filed Weights   05/09/21 0435 05/10/21 0500 05/11/21 0600  Weight: 77.1 kg 77.9 kg 76.8 kg    Intake/Output:   Intake/Output Summary (Last 24 hours) at 05/11/2021 1102 Last data filed at 05/11/2021 0600 Gross per 24 hour  Intake 1376.36 ml  Output 1250 ml  Net 126.36 ml       Physical Exam    General:  Sitting up in bed. No resp difficulty HEENT: normal Neck: supple. no JVD. Carotids 2+ bilat; no bruits. No lymphadenopathy or thryomegaly appreciated. Cor: PMI nondisplaced. Irregular rate & rhythm. No rubs, gallops or murmurs. Lungs: clear Abdomen: soft, nontender, nondistended. No hepatosplenomegaly. No bruits or masses. Good bowel  sounds. Extremities: no cyanosis, clubbing, rash, edema Neuro: alert & orientedx3, cranial nerves grossly intact. moves all 4 extremities w/o difficulty. Affect pleasant   Telemetry   AF 70-80s Personally reviewed  Labs    CBC Recent Labs    05/10/21 0455 05/11/21 0500  WBC 8.6 6.5  HGB 12.2* 11.8*  HCT 35.4* 35.5*  MCV 95.4 94.7  PLT 162 035    Basic Metabolic Panel Recent Labs    05/09/21 0425 05/09/21 1340 05/10/21 0455 05/11/21 0500  NA 129*   < > 129* 131*  K 3.6   < > 4.0 4.0  CL 93*  --  93* 98  CO2 22  --  24 25  GLUCOSE 415*  --  297* 160*  BUN 22  --  23 26*  CREATININE 2.00*  --  2.20* 2.60*  CALCIUM 8.5*  --  8.5* 8.6*  MG 1.9  --   --  1.9   < > = values in this interval not displayed.    Liver Function Tests No results for input(s): AST, ALT, ALKPHOS, BILITOT, PROT, ALBUMIN in the last 72 hours.  No results for input(s): LIPASE, AMYLASE in the last 72 hours. Cardiac Enzymes No results for input(s): CKTOTAL, CKMB, CKMBINDEX, TROPONINI in the last 72 hours.  BNP: BNP (last 3 results) Recent Labs    04/29/21 1251  BNP 1,659.6*     ProBNP (  last 3 results) No results for input(s): PROBNP in the last 8760 hours.   D-Dimer No results for input(s): DDIMER in the last 72 hours. Hemoglobin A1C No results for input(s): HGBA1C in the last 72 hours. Fasting Lipid Panel No results for input(s): CHOL, HDL, LDLCALC, TRIG, CHOLHDL, LDLDIRECT in the last 72 hours. Thyroid Function Tests No results for input(s): TSH, T4TOTAL, T3FREE, THYROIDAB in the last 72 hours.  Invalid input(s): FREET3  Other results:   Imaging    No results found.   Medications:     Scheduled Medications:  apixaban  5 mg Oral BID   atorvastatin  80 mg Oral Daily   Chlorhexidine Gluconate Cloth  6 each Topical Daily   digoxin  0.0625 mg Oral Daily   folic acid  1 mg Oral Daily   insulin aspart  0-5 Units Subcutaneous QHS   insulin aspart  0-9 Units  Subcutaneous TID WC   mouth rinse  15 mL Mouth Rinse BID   multivitamin with minerals  1 tablet Oral Daily   senna-docusate  1 tablet Oral BID   sodium chloride flush  10-40 mL Intracatheter Q12H   sodium chloride flush  3 mL Intravenous Q12H   sodium chloride flush  3 mL Intravenous Q12H   thiamine  100 mg Oral Daily    Infusions:  sodium chloride     sodium chloride     amiodarone 30 mg/hr (05/11/21 0600)   furosemide (LASIX) 200 mg in dextrose 5% 100 mL (2mg /mL) infusion 6 mg/hr (05/11/21 0600)   milrinone 0.25 mcg/kg/min (05/11/21 0600)   nitroGLYCERIN Stopped (05/09/21 2313)    PRN Medications: sodium chloride, sodium chloride, acetaminophen, ALPRAZolam, ondansetron (ZOFRAN) IV, polyethylene glycol, sodium chloride flush, sodium chloride flush, sodium chloride flush    Assessment/Plan   1. CAD: Suspect delayed presentation LCx (versus RCA) MI.  Echo with EF 35-40%, inferolateral and basal inferior akinesis, moderate-severe eccentric likely infarct-related MR, normal RV.  He was not cathed initially due to AKI and delayed presentation. No s/s angina  - LHC with occluded RCA (culprit), diffuse moderate disease in LAD and Lcx. No good PCI targets. Not a candidate for CABG. - Continue statin - Continue aspirin 2. Acute systolic CHF/cardiogenic shock: Ischemic cardiomyopathy with infarct-related MR. Echo with EF 35-40%, inferolateral and basal inferior akinesis, moderate-severe eccentric likely infarct-related MR, normal RV.  Echo does not look bad enough to explain low output HF, concerned that MR may be playing a role.  TEE on 01/03 with EF 35-40%, moderate MR (suspect infarct-related), aortic dilatation measuring 44 mm, moderate AI.  - RHC on 0.25 mil: RA mean 16, PA 66/26 (41), PCWP 27, PA 62%, Fick CO 5.04/ CI 2.56  - Co-ox 63% on 0.25 mil. Decrease to 0.125  - CVP 4-5 Stop lasix gtt - GDMT limited by AKI - Continue digoxin 0.0625.  - No beta blocker for now with recent  shock 3. Hyponatremia: 129->131 restrict FW - Monitor 4. Valvular heart disease: -MR moderate in severity on TEE. Likely infarct related. Moderate AI. EF 35-40% 5. Atrial fibrillation: First noted this admission.  Remains in A fib. Rate controlled on amio drip at 30. On apixaban - Consider TEE/DCCV early next week once off milrinone 6. AKI on CKD 3: Baseline creatinine around 1.4 in 6/22. Peak at 3.09, now 2.2 -> 2.6 - Follow closely post-cath. Hold diuretics 7. Active smoker: Needs to quit.  8. ETOH abuse: Needs to cut back.  9. Thrombocytopenia: Mild, pre-dates heparin initiation. Resolved.  10. Hyperglycemia: - Continue SSI  - A1c 5.7  Length of Stay: [place], MD  05/11/2021, 11:02 AM  Advanced Heart Failure Team Pager [phone] (M-F; 7a - 5p)  Please contact [facility] for night-coverage after hours (5p -7a ) and weekends on [email or web address]

## 2021-05-12 DIAGNOSIS — R570 Cardiogenic shock: Secondary | ICD-10-CM | POA: Diagnosis not present

## 2021-05-12 LAB — BASIC METABOLIC PANEL
Anion gap: 12 (ref 5–15)
BUN: 28 mg/dL — ABNORMAL HIGH (ref 8–23)
CO2: 26 mmol/L (ref 22–32)
Calcium: 8.8 mg/dL — ABNORMAL LOW (ref 8.9–10.3)
Chloride: 92 mmol/L — ABNORMAL LOW (ref 98–111)
Creatinine, Ser: 3.08 mg/dL — ABNORMAL HIGH (ref 0.61–1.24)
GFR, Estimated: 20 mL/min — ABNORMAL LOW (ref >60)
Glucose, Bld: 206 mg/dL — ABNORMAL HIGH (ref 70–99)
Potassium: 4 mmol/L (ref 3.5–5.1)
Sodium: 130 mmol/L — ABNORMAL LOW (ref 135–145)

## 2021-05-12 LAB — COOXEMETRY PANEL
Carboxyhemoglobin: 1 % (ref 0.5–1.5)
Methemoglobin: 0.9 % (ref 0.0–1.5)
O2 Saturation: 59.6 %
Total hemoglobin: 13.1 g/dL (ref 12.0–16.0)

## 2021-05-12 LAB — DIGOXIN LEVEL: Digoxin Level: 0.6 ng/mL — ABNORMAL LOW (ref 0.8–2.0)

## 2021-05-12 LAB — CBC
HCT: 36.9 % — ABNORMAL LOW (ref 39.0–52.0)
Hemoglobin: 12.7 g/dL — ABNORMAL LOW (ref 13.0–17.0)
MCH: 32.3 pg (ref 26.0–34.0)
MCHC: 34.4 g/dL (ref 30.0–36.0)
MCV: 93.9 fL (ref 80.0–100.0)
Platelets: 167 K/uL (ref 150–400)
RBC: 3.93 MIL/uL — ABNORMAL LOW (ref 4.22–5.81)
RDW: 13.5 % (ref 11.5–15.5)
WBC: 6.6 K/uL (ref 4.0–10.5)
nRBC: 0 % (ref 0.0–0.2)

## 2021-05-12 LAB — GLUCOSE, CAPILLARY
Glucose-Capillary: 114 mg/dL — ABNORMAL HIGH (ref 70–99)
Glucose-Capillary: 99 mg/dL (ref 70–99)
Glucose-Capillary: 111 mg/dL — ABNORMAL HIGH (ref 70–99)
Glucose-Capillary: 104 mg/dL — ABNORMAL HIGH (ref 70–99)

## 2021-05-12 LAB — MAGNESIUM: Magnesium: 1.8 mg/dL (ref 1.7–2.4)

## 2021-05-12 MED ORDER — SODIUM CHLORIDE 0.45 % IV SOLN: INTRAVENOUS | Status: DC

## 2021-05-12 NOTE — Progress Notes (Signed)
Patient ID: Ruben [name], male   DOB: 01/21/1946, 76 y.o.   MRN: [id number]     Advanced Heart Failure Rounding Note  PCP-Cardiologist: Sanda Klein, MD   Subjective:   12/26: Admit with late presentation MI. New AF 12/28: CGS initial co-ox 47%. Started NE + mil. 01/03: SR 05/08/20 Back in A fib and milrinone was cut back to 0.125 mcg.  05/09/20 Milrinone increased back to 0.25. Volume overload, elevated filling pressures on RHC with preserved CO on milrinone. Hypertensive. Back in ICU on nitro gtt  LHC 1/5: occluded RCA (culprit), diffuse disease in LAD and Lcx. Seen by CVTS. Not felt to be a candidate for surgery.   On milrinone 0.125 (cut back yesterday) Co-ox 63% -> 60% Lasix gtt stopped yesterday. CVP 5-6   Scr 2.0>2.2 > 2.6 > 3.1  Na 135>129 > 131 > 136  Remains in AF/AFL on amio gtt, rate 70s-80s. On apixaban  Feels ok. Denies SOB, orthopnea or PND.   Objective:   Weight Range: 76.5 kg Body mass index is 23.52 kg/m.   Vital Signs:   Temp:  [97.7 F (36.5 C)-98.1 F (36.7 C)] 97.7 F (36.5 C) (01/08 1130) Pulse Rate:  [73-96] 78 (01/08 0922) Resp:  [11-23] 21 (01/08 0700) BP: (96-126)/(54-114) 101/63 (01/08 0700) SpO2:  [90 %-97 %] 93 % (01/08 0700) Weight:  [76.5 kg] 76.5 kg (01/08 0500) Last BM Date: 05/11/21  Weight change: Filed Weights   05/10/21 0500 05/11/21 0600 05/12/21 0500  Weight: 77.9 kg 76.8 kg 76.5 kg    Intake/Output:   Intake/Output Summary (Last 24 hours) at 05/12/2021 1246 Last data filed at 05/12/2021 0600 Gross per 24 hour  Intake 620.28 ml  Output 225 ml  Net 395.28 ml       Physical Exam    General:  Sitting up in chair No resp difficulty HEENT: normal Neck: supple. no JVD. Carotids 2+ bilat; no bruits. No lymphadenopathy or thryomegaly appreciated. Cor: PMI nondisplaced. Irregular rate & rhythm. No rubs, gallops or murmurs. Lungs: clear Abdomen: soft, nontender, nondistended. No hepatosplenomegaly. No bruits or  masses. Good bowel sounds. Extremities: no cyanosis, clubbing, rash, edema Neuro: alert & orientedx3, cranial nerves grossly intact. moves all 4 extremities w/o difficulty. Affect pleasant   Telemetry   AF 70-80s Personally reviewed   Labs    CBC Recent Labs    05/11/21 0500 05/12/21 0421  WBC 6.5 6.6  HGB 11.8* 12.7*  HCT 35.5* 36.9*  MCV 94.7 93.9  PLT 159 629    Basic Metabolic Panel Recent Labs    05/11/21 0500 05/12/21 0421  NA 131* 130*  K 4.0 4.0  CL 98 92*  CO2 25 26  GLUCOSE 160* 206*  BUN 26* 28*  CREATININE 2.60* 3.08*  CALCIUM 8.6* 8.8*  MG 1.9 1.8    Liver Function Tests No results for input(s): AST, ALT, ALKPHOS, BILITOT, PROT, ALBUMIN in the last 72 hours.  No results for input(s): LIPASE, AMYLASE in the last 72 hours. Cardiac Enzymes No results for input(s): CKTOTAL, CKMB, CKMBINDEX, TROPONINI in the last 72 hours.  BNP: BNP (last 3 results) Recent Labs    04/29/21 1251  BNP 1,659.6*     ProBNP (last 3 results) No results for input(s): PROBNP in the last 8760 hours.   D-Dimer No results for input(s): DDIMER in the last 72 hours. Hemoglobin A1C No results for input(s): HGBA1C in the last 72 hours. Fasting Lipid Panel No results for input(s): CHOL, HDL, LDLCALC, TRIG,  CHOLHDL, LDLDIRECT in the last 72 hours. Thyroid Function Tests No results for input(s): TSH, T4TOTAL, T3FREE, THYROIDAB in the last 72 hours.  Invalid input(s): FREET3  Other results:   Imaging    No results found.   Medications:     Scheduled Medications:  apixaban  5 mg Oral BID   atorvastatin  80 mg Oral Daily   Chlorhexidine Gluconate Cloth  6 each Topical Daily   digoxin  0.0625 mg Oral Daily   folic acid  1 mg Oral Daily   insulin aspart  0-5 Units Subcutaneous QHS   insulin aspart  0-9 Units Subcutaneous TID WC   mouth rinse  15 mL Mouth Rinse BID   multivitamin with minerals  1 tablet Oral Daily   senna-docusate  1 tablet Oral BID    sodium chloride flush  10-40 mL Intracatheter Q12H   sodium chloride flush  3 mL Intravenous Q12H   sodium chloride flush  3 mL Intravenous Q12H   thiamine  100 mg Oral Daily    Infusions:  sodium chloride     sodium chloride     amiodarone 30 mg/hr (05/12/21 1222)   milrinone 0.125 mcg/kg/min (05/12/21 0600)    PRN Medications: sodium chloride, sodium chloride, acetaminophen, ALPRAZolam, ondansetron (ZOFRAN) IV, polyethylene glycol, sodium chloride flush, sodium chloride flush, sodium chloride flush    Assessment/Plan   1. CAD: Suspect delayed presentation LCx (versus RCA) MI.  Echo with EF 35-40%, inferolateral and basal inferior akinesis, moderate-severe eccentric likely infarct-related MR, normal RV.  He was not cathed initially due to AKI and delayed presentation. No s/s angina - LHC with occluded RCA (culprit), diffuse moderate disease in LAD and Lcx. No good PCI targets. Not a candidate for CABG. - Continue statin - Continue aspirin 2. Acute systolic CHF/cardiogenic shock: Ischemic cardiomyopathy with infarct-related MR. Echo with EF 35-40%, inferolateral and basal inferior akinesis, moderate-severe eccentric likely infarct-related MR, normal RV.  Echo does not look bad enough to explain low output HF, concerned that MR may be playing a role.  TEE on 01/03 with EF 35-40%, moderate MR (suspect infarct-related), aortic dilatation measuring 44 mm, moderate AI.  - RHC on 0.25 mil: RA mean 16, PA 66/26 (41), PCWP 27, PA 62%, Fick CO 5.04/ CI 2.56  - Co-ox 60% on 0.125 mil. Given progressive AKI will continue inotrope support for now.  - CVP 5-6 Continue to hold diuretics - GDMT limited by AKI - Stop digoxin with AKI  - No beta blocker or other GDMT for now with recent shock and AKI 3. Hyponatremia: 129->131 -> 130 restrict FW - Monitor 4. Valvular heart disease: -MR moderate in severity on TEE. Likely infarct related. Moderate AI. EF 35-40% 5. Atrial fibrillation: First noted  this admission.  Remains in A fib. Rate controlled on amio drip at 30. On apixaban - Consider TEE/DCCV later this week once off milrinone and when renal function stabilized 6. AKI on CKD 3: Baseline creatinine around 1.4 in 6/22. Peak at 3.09, now 2.2 -> 2.6 -> 3.1. ? ATN vs CIN  - Follow closely post-cath. Hold diuretics - Continue milrinone for now 7. Active smoker: Needs to quit.  8. ETOH abuse: Needs to cut back.  9. Thrombocytopenia: Mild, pre-dates heparin initiation. Resolved. 10. Hyperglycemia: - Continue SSI  - A1c 5.7  Can go to [facility]  Length of Stay: 13  Glori Bickers, MD  05/12/2021, 12:46 PM  Advanced Heart Failure Team Pager [phone] (M-F; 7a - 5p)  Please contact  [facility] for night-coverage after hours (5p -7a ) and weekends on [email or web address]

## 2021-05-12 NOTE — Plan of Care (Signed)

## 2021-05-13 ENCOUNTER — Encounter (HOSPITAL_COMMUNITY): Payer: Self-pay | Admitting: Cardiovascular Disease

## 2021-05-13 ENCOUNTER — Encounter (HOSPITAL_COMMUNITY)
Admission: EM | Disposition: A | Discharge status: Home / Self Care | Payer: Self-pay | Source: Ambulatory Visit | Attending: Cardiovascular Disease

## 2021-05-13 ENCOUNTER — Inpatient Hospital Stay (HOSPITAL_COMMUNITY): Payer: Medicare Other | Admitting: General Practice

## 2021-05-13 ENCOUNTER — Inpatient Hospital Stay (HOSPITAL_COMMUNITY): Payer: Medicare Other

## 2021-05-13 DIAGNOSIS — I5043 Acute on chronic combined systolic (congestive) and diastolic (congestive) heart failure: Secondary | ICD-10-CM | POA: Diagnosis not present

## 2021-05-13 DIAGNOSIS — I4891 Unspecified atrial fibrillation: Secondary | ICD-10-CM

## 2021-05-13 DIAGNOSIS — I351 Nonrheumatic aortic (valve) insufficiency: Secondary | ICD-10-CM

## 2021-05-13 DIAGNOSIS — R9431 Abnormal electrocardiogram [ECG] [EKG]: Secondary | ICD-10-CM

## 2021-05-13 HISTORY — PX: TEE WITHOUT CARDIOVERSION: SHX5443

## 2021-05-13 HISTORY — PX: CARDIOVERSION: SHX1299

## 2021-05-13 LAB — CBC
HCT: 36.8 % — ABNORMAL LOW (ref 39.0–52.0)
Hemoglobin: 12.9 g/dL — ABNORMAL LOW (ref 13.0–17.0)
MCH: 32.5 pg (ref 26.0–34.0)
MCHC: 35.1 g/dL (ref 30.0–36.0)
MCV: 92.7 fL (ref 80.0–100.0)
Platelets: 177 10*3/uL (ref 150–400)
RBC: 3.97 MIL/uL — ABNORMAL LOW (ref 4.22–5.81)
RDW: 13.3 % (ref 11.5–15.5)
WBC: 7.1 10*3/uL (ref 4.0–10.5)
nRBC: 0 % (ref 0.0–0.2)

## 2021-05-13 LAB — BASIC METABOLIC PANEL
Anion gap: 11 (ref 5–15)
BUN: 34 mg/dL — ABNORMAL HIGH (ref 8–23)
CO2: 25 mmol/L (ref 22–32)
Calcium: 8.6 mg/dL — ABNORMAL LOW (ref 8.9–10.3)
Chloride: 94 mmol/L — ABNORMAL LOW (ref 98–111)
Creatinine, Ser: 4 mg/dL — ABNORMAL HIGH (ref 0.61–1.24)
GFR, Estimated: 15 mL/min — ABNORMAL LOW (ref >60)
Glucose, Bld: 99 mg/dL (ref 70–99)
Potassium: 3.9 mmol/L (ref 3.5–5.1)
Sodium: 130 mmol/L — ABNORMAL LOW (ref 135–145)

## 2021-05-13 LAB — GLUCOSE, CAPILLARY
Glucose-Capillary: 118 mg/dL — ABNORMAL HIGH (ref 70–99)
Glucose-Capillary: 100 mg/dL — ABNORMAL HIGH (ref 70–99)
Glucose-Capillary: 106 mg/dL — ABNORMAL HIGH (ref 70–99)
Glucose-Capillary: 108 mg/dL — ABNORMAL HIGH (ref 70–99)
Glucose-Capillary: 98 mg/dL (ref 70–99)
Glucose-Capillary: 128 mg/dL — ABNORMAL HIGH (ref 70–99)

## 2021-05-13 LAB — PROTIME-INR
INR: 1.8 — ABNORMAL HIGH (ref 0.8–1.2)
Prothrombin Time: 21.1 seconds — ABNORMAL HIGH (ref 11.4–15.2)

## 2021-05-13 LAB — COOXEMETRY PANEL
Carboxyhemoglobin: 0.7 % (ref 0.5–1.5)
Methemoglobin: 0.8 % (ref 0.0–1.5)
O2 Saturation: 62.4 %
Total hemoglobin: 18.6 g/dL — ABNORMAL HIGH (ref 12.0–16.0)

## 2021-05-13 SURGERY — ECHOCARDIOGRAM, TRANSESOPHAGEAL
Anesthesia: General

## 2021-05-13 MED ORDER — SODIUM CHLORIDE 4 MEQ/ML IV SOLN: Freq: Once | ORAL | Status: DC

## 2021-05-13 MED ORDER — SODIUM CHLORIDE 0.9 % IV SOLN
INTRAVENOUS | Status: AC | PRN
  Administered 2021-05-13: 500 mL via INTRAVENOUS

## 2021-05-13 MED ORDER — LIDOCAINE 2% (20 MG/ML) 5 ML SYRINGE
INTRAMUSCULAR | Status: DC | PRN
  Administered 2021-05-13: 100 mg via INTRAVENOUS

## 2021-05-13 MED ORDER — PROPOFOL 500 MG/50ML IV EMUL
INTRAVENOUS | Status: DC | PRN
  Administered 2021-05-13: 100 ug/kg/min via INTRAVENOUS

## 2021-05-13 MED ORDER — PROPOFOL 10 MG/ML IV BOLUS
INTRAVENOUS | Status: DC | PRN
  Administered 2021-05-13 (×2): 20 mg via INTRAVENOUS
  Administered 2021-05-13: 10 mg via INTRAVENOUS
  Administered 2021-05-13: 20 mg via INTRAVENOUS

## 2021-05-13 MED ORDER — PENTAFLUOROPROP-TETRAFLUOROETH EX AERO
INHALATION_SPRAY | CUTANEOUS | Status: DC | PRN
  Administered 2021-05-13: 1 via TOPICAL
  Filled 2021-05-13: qty 116

## 2021-05-13 MED ORDER — MIDODRINE HCL 5 MG PO TABS
5.0000 mg | ORAL_TABLET | Freq: Three times a day (TID) | ORAL | Status: DC
  Administered 2021-05-13 – 2021-05-18 (×15): 5 mg via ORAL
  Filled 2021-05-13 (×16): qty 1

## 2021-05-13 NOTE — Transfer of Care (Signed)
Immediate Anesthesia Transfer of Care Note  Patient: Ruben [name]  Procedure(s) Performed: TRANSESOPHAGEAL ECHOCARDIOGRAM (TEE) CARDIOVERSION  Patient Location: Endoscopy Unit  Anesthesia Type:MAC  Level of Consciousness: awake and drowsy  Airway & Oxygen Therapy: Patient Spontanous Breathing  Post-op Assessment: Report given to RN and Post -op Vital signs reviewed and stable  Post vital signs: Reviewed and stable  Last Vitals:  Vitals Value Taken Time  BP 95/58 05/13/21 1338  Temp    Pulse 85 05/13/21 1339  Resp 18 05/13/21 1339  SpO2 95 % 05/13/21 1339  Vitals shown include unvalidated device data.  Last Pain:  Vitals:   05/13/21 1223  TempSrc: Oral  PainSc: 0-No pain      Patients Stated Pain Goal: 0 (24/40/10 2725)  Complications: No notable events documented.

## 2021-05-13 NOTE — Progress Notes (Signed)
Pt resting comfortably at this time, bipap not needed. RT will continue to monitor as needed throughout night.

## 2021-05-13 NOTE — Progress Notes (Addendum)
Patient ID: Ruben [name], male   DOB: 1945/08/25, 76 y.o.   MRN: [id number]     Advanced Heart Failure Rounding Note  PCP-Cardiologist: Sanda Klein, MD   Subjective:   12/26: Admit with late presentation MI. New AF 12/28: CGS initial co-ox 47%. Started NE + mil. 01/03: SR 05/08/20 Back in A fib and milrinone was cut back to 0.125 mcg.  05/09/20 Milrinone increased back to 0.25. Volume overload, elevated filling pressures on RHC with preserved CO on milrinone. Hypertensive. Back in ICU on nitro gtt  LHC 1/5: occluded RCA (culprit), diffuse disease in LAD and Lcx. Seen by CVTS. Not felt to be a candidate for surgery.   Co-ox 62% early am on 0.125 milrinone. Milrinone now off.   CVP 7-8. Off diuretics.  Scr 2.0>2.2 > 2.6 > 3.1 > 4.00  Na 135>129>131>130>130  Remains in AFL/AF on amio gtt, rate 70s-80s. On 5 mg eliquis BID.  Feels good. Lying comfortably in bed. Ambulated halls twice yesterday. No CP or dyspnea.    Objective:   Weight Range: 77.8 kg Body mass index is 23.92 kg/m.   Vital Signs:   Temp:  [97.7 F (36.5 C)-98.1 F (36.7 C)] 97.9 F (36.6 C) (01/09 0400) Pulse Rate:  [74-106] 82 (01/09 0401) Resp:  [12-21] 18 (01/09 0401) BP: (96-127)/(59-71) 112/71 (01/09 0400) SpO2:  [88 %-95 %] 92 % (01/09 0401) Weight:  [77.8 kg] 77.8 kg (01/09 0401) Last BM Date: 05/12/21  Weight change: Filed Weights   05/11/21 0600 05/12/21 0500 05/13/21 0401  Weight: 76.8 kg 76.5 kg 77.8 kg    Intake/Output:   Intake/Output Summary (Last 24 hours) at 05/13/2021 0700 Last data filed at 05/13/2021 0300 Gross per 24 hour  Intake 563.07 ml  Output --  Net 563.07 ml      Physical Exam    CVP 7-8 General:  Lying comfortably in bed HEENT: normal Neck: supple. JVP ~ 8 cm. Carotids 2+ bilat; no bruits.  Cor: PMI nondisplaced. Irregular rhythm. No rubs, gallops, 2/6 HSM Lungs: clear Abdomen: soft, nontender, nondistended. No hepatosplenomegaly. No bruits or masses.  Good bowel sounds. Extremities: no cyanosis, clubbing, rash, edema Neuro: alert & orientedx3, cranial nerves grossly intact. moves all 4 extremities w/o difficulty. Affect pleasant    Telemetry   AF 70s-80s (personally reviewed)   Labs    CBC Recent Labs    05/12/21 0421 05/13/21 0355  WBC 6.6 7.1  HGB 12.7* 12.9*  HCT 36.9* 36.8*  MCV 93.9 92.7  PLT 167 250   Basic Metabolic Panel Recent Labs    05/11/21 0500 05/12/21 0421 05/13/21 0355  NA 131* 130* 130*  K 4.0 4.0 3.9  CL 98 92* 94*  CO2 25 26 25   GLUCOSE 160* 206* 99  BUN 26* 28* 34*  CREATININE 2.60* 3.08* 4.00*  CALCIUM 8.6* 8.8* 8.6*  MG 1.9 1.8  --    Liver Function Tests No results for input(s): AST, ALT, ALKPHOS, BILITOT, PROT, ALBUMIN in the last 72 hours.  No results for input(s): LIPASE, AMYLASE in the last 72 hours. Cardiac Enzymes No results for input(s): CKTOTAL, CKMB, CKMBINDEX, TROPONINI in the last 72 hours.  BNP: BNP (last 3 results) Recent Labs    04/29/21 1251  BNP 1,659.6*    ProBNP (last 3 results) No results for input(s): PROBNP in the last 8760 hours.   D-Dimer No results for input(s): DDIMER in the last 72 hours. Hemoglobin A1C No results for input(s): HGBA1C in the last 72  hours. Fasting Lipid Panel No results for input(s): CHOL, HDL, LDLCALC, TRIG, CHOLHDL, LDLDIRECT in the last 72 hours. Thyroid Function Tests No results for input(s): TSH, T4TOTAL, T3FREE, THYROIDAB in the last 72 hours.  Invalid input(s): FREET3  Other results:   Imaging    No results found.   Medications:     Scheduled Medications:  apixaban  5 mg Oral BID   atorvastatin  80 mg Oral Daily   Chlorhexidine Gluconate Cloth  6 each Topical Daily   folic acid  1 mg Oral Daily   insulin aspart  0-5 Units Subcutaneous QHS   insulin aspart  0-9 Units Subcutaneous TID WC   mouth rinse  15 mL Mouth Rinse BID   multivitamin with minerals  1 tablet Oral Daily   senna-docusate  1 tablet Oral  BID   sodium chloride flush  10-40 mL Intracatheter Q12H   sodium chloride flush  3 mL Intravenous Q12H   sodium chloride flush  3 mL Intravenous Q12H   thiamine  100 mg Oral Daily    Infusions:  sodium chloride 10 mL/hr at 05/12/21 2313   sodium chloride     sodium chloride     amiodarone 30 mg/hr (05/13/21 0300)   milrinone 0.125 mcg/kg/min (05/13/21 0300)    PRN Medications: sodium chloride, sodium chloride, acetaminophen, ALPRAZolam, ondansetron (ZOFRAN) IV, polyethylene glycol, sodium chloride flush, sodium chloride flush, sodium chloride flush    Assessment/Plan   1. CAD: Suspect delayed presentation LCx (versus RCA) MI.  Echo with EF 35-40%, inferolateral and basal inferior akinesis, moderate-severe eccentric likely infarct-related MR, normal RV.  He was not cathed initially due to AKI and delayed presentation. No s/s angina - LHC with occluded RCA (culprit), diffuse moderate disease in LAD and Lcx. No good PCI targets. Not a candidate for CABG. - Continue statin - Continue aspirin 2. Acute systolic CHF/cardiogenic shock: Ischemic cardiomyopathy with infarct-related MR. Echo with EF 35-40%, inferolateral and basal inferior akinesis, moderate-severe eccentric likely infarct-related MR, normal RV.  Echo does not look bad enough to explain low output HF, concerned that MR may be playing a role.  TEE on 01/03 with EF 35-40%, moderate MR (suspect infarct-related), aortic dilatation measuring 44 mm, moderate AI.  - RHC on 0.25 mil: RA mean 16, PA 66/26 (41), PCWP 27, PA 62%, Fick CO 5.04/ CI 2.56  - Co-ox 62% early am on 0.125 milrinone. Mil now off early this morning. - CVP 7-8. Remains off diuretics.  - GDMT limited by AKI - No beta blocker or other GDMT for now with recent shock and AKI 3. Hyponatremia: 129->131 -> 130 restrict FW - Monitor 4. Valvular heart disease: - MR moderate in severity on TEE. Likely infarct related. Moderate AI. EF 35-40% 5. Atrial fibrillation: First  noted this admission.  Remains in A fib. Rate controlled on amio drip at 30. On apixaban - TEE/DCCV later today 6. AKI on CKD 3: Baseline creatinine around 1.4 in 6/22. Peak at 3.09 and trended down, now 2.2 -> 2.6 -> 3.1 > 4.0. ? ATN vs CIN post LHC - Follow closely. Need strict Is/Os.  - BP intermittently soft. Consider midodrine if SBP consistently low. - May need nephrology consult if continues to worsen 7. Active smoker: Needs to quit.  8. ETOH abuse: Needs to cut back.  9. Thrombocytopenia: Mild, pre-dates heparin initiation. Resolved. 10. Hyperglycemia: - Continue SSI  - A1c 5.7   Length of Stay: Roland, LINDSAY N, PA-C  05/13/2021, 7:00 AM  Advanced Heart Failure Team Pager [phone] (M-F; 7a - 5p)  Please contact [facility] for night-coverage after hours (5p -7a ) and weekends on [email or web address]  Patient seen with PA, agree with the above note.    He remains in atrial fibrillation today, rate 100s.  SBP 90s-100s.  Creatinine higher at 4.0.  UOP not recorded.  Co-ox stable at 62%. CVP 7-8.   No complaints today.   General: NAD Neck: No JVD, no thyromegaly or thyroid nodule.  Lungs: Clear to auscultation bilaterally with normal respiratory effort. CV: Nondisplaced PMI.  Heart irregular S1/S2, no S3/S4, no murmur.  No peripheral edema.   Abdomen: Soft, nontender, no hepatosplenomegaly, no distention.  Skin: Intact without lesions or rashes.  Neurologic: Alert and oriented x 3.  Psych: Normal affect. Extremities: No clubbing or cyanosis.  HEENT: Normal.   CVP 7-8 with creatinine higher.  Possible ATN progression versus CIN from cath contrast (only about 35 cc).   - Add low dose midodrine for now to keep BP up.  - No diuretic.  - Follow creatinine closely and need to document UOP.   He remains in atrial fibrillation. Plan for TEE-guided DCCV later today.  Can reassess MR on TEE now that he is off milrinone. Discussed risks/benefits and patient agrees to procedure.    Loralie Champagne 05/13/2021 8:30 AM

## 2021-05-13 NOTE — Progress Notes (Signed)
Mobility Specialist Progress Note    05/13/21 1014  Mobility  Activity Ambulated in hall  Level of Assistance Standby assist, set-up cues, supervision of patient - no hands on  Assistive Device None  Distance Ambulated (ft) 350 ft  Mobility Ambulated independently in hallway  Mobility Response Tolerated well  Mobility performed by Mobility specialist  Bed Position Chair  $Mobility charge 1 Mobility   Pre-Mobility: 85 HR, 96% SpO2 During Mobility: 100 HR Post-Mobility: 86 HR, 106/64 BP, 95% SpO2  Pt received in bed and agreeable. No complaints on walk. Left pt in chair at sink and encouraged to hit call bell when finished for assistance with IV pole.  [facility] Mobility Specialist  M.S. 2C and 6E: [phone] M.S. 4E: [phone]

## 2021-05-13 NOTE — Anesthesia Procedure Notes (Signed)
Procedure Name: MAC Date/Time: 05/13/2021 1:09 PM Performed by: Dorann Lodge, CRNA Pre-anesthesia Checklist: Patient identified, Emergency Drugs available, Suction available, Patient being monitored and Timeout performed Patient Re-evaluated:Patient Re-evaluated prior to induction Oxygen Delivery Method: Nasal cannula Induction Type: IV induction Airway Equipment and Method: Bite block Dental Injury: Teeth and Oropharynx as per pre-operative assessment

## 2021-05-13 NOTE — Anesthesia Postprocedure Evaluation (Signed)
Anesthesia Post Note  Patient: Ruben [name]  Procedure(s) Performed: TRANSESOPHAGEAL ECHOCARDIOGRAM (TEE) CARDIOVERSION     Patient location during evaluation: Endoscopy Anesthesia Type: General Level of consciousness: awake and alert Pain management: pain level controlled Vital Signs Assessment: post-procedure vital signs reviewed and stable Respiratory status: spontaneous breathing, nonlabored ventilation, respiratory function stable and patient connected to nasal cannula oxygen Cardiovascular status: blood pressure returned to baseline and stable Postop Assessment: no apparent nausea or vomiting Anesthetic complications: no   No notable events documented.  Last Vitals:  Vitals:   05/13/21 1345 05/13/21 1355  BP: (!) 100/54 (!) 101/54  Pulse: 78 72  Resp: 18 (!) 21  Temp:    SpO2: 96% 97%    Last Pain:  Vitals:   05/13/21 1355  TempSrc:   PainSc: 0-No pain                 Catalina Gravel

## 2021-05-13 NOTE — Discharge Summary (Addendum)
Advanced Heart Failure Team  Discharge Summary   Patient ID: Ruben [name] MRN: [id number], DOB/AGE: 12-14-1945 76 y.o. Admit date: 04/29/2021 D/C date:     05/28/2021   Primary Discharge Diagnoses:  NSTEMI Acute systolic CHF Cardiogenic shock Hyponatremia Mitral regurgitation Aortic valve insufficiency AKI on CKD III Active smoker ETOH abuse Thrombocytopenia   Hospital Course:  Ruben [name] is a 76 y.o. male with history of HTN, HLD, CKD 3b, tobacco and alcohol abuse (pint a day of wine), depression.   Admitted 04/29/21 with suspected late presentation MI. Had CP throughout day prior to admit. Seen in urgent care and was hypoxic with O2 sats 88-91% on RA. Transferred to ED for workup. Went into AF with RVR and placed on BiPAP. HS troponin >20,000, BNP 1659, AST 190. Scr 1.68 (at baseline 1.4-1.7). Admitted to ICU for further management.    Echo with LVEF 35-40%, inferior hypokinesis, RV okay, splay artifact with likely moderate to severe Ruben   Subsequently developed CGS. PICC line placed.  Initial Co-ox low at 47% and required addition of NE + milrinone. Advanced HF team consulted. Diuresed with IV lasix until euvolemic.    TEE 01/03 demonstrated EF 35-40%, RV okay, moderate AI, moderate Ruben (suspect infarct related)  R/LHC 01/05 (initially delayed d/t AKI on CKD): Occluded RCA (culprit), diffuse disease in LAD and Lcx. On 0.25 mil RA mean 16, PA 66/26 (41), PCWP 27, PA 62%, Fick CO 5.04/ CI 2.56   Seen by CVTS. Not felt to be a surgical candidate.   Attempted to wean off milrinone. During cath 01/05 became hypertensive and went into respiratory distress. Briefly required nitro gtt and started on milrinone + lasix gtt. Lasix gtt off on 01/07 and milrinone discontinued 01/08.   Remained in AF despite Amio gtt. Underwent TEE/DCCV on 01/09 --> SR. 01/09: EF 35-40%, RV okay, moderate AI, trivial TR, moderate, likely infarct-related, Ruben. Transitioned to amio 200 mg daily. After  procedures completed he was started on eliquis 5 mg twice a day.   Course c/b AKI on CKD. Scr initially peaked at 3.09 then trended down to 2.0 01/05 (day of LHC). Scr subsequently trended upwards and peaked at 4.00. Felt to be possibly d/t worsening ATN in setting of shock/episodes of hypotension versus CIN. Nephrology consulted. Transferred to ICU and placed on CVVHD. Later switched to iHD. Had tunneled HD catheter placed. Vascular consulted and placed AVG. Renal Navigator set up for outpatient HD starting 05/29/21.    See below for detailed problem list.    1. CAD: Suspect delayed presentation LCx (versus RCA) MI.  Echo with EF 35-40%, inferolateral and basal inferior akinesis, moderate-severe eccentric likely infarct-related Ruben, normal RV.  He was not cathed initially due to AKI and delayed presentation. Eventual LHC with occluded RCA (culprit), diffuse moderate disease in LAD and Lcx. No good PCI targets. Not a candidate for CABG. No chest pain.  - Continue statin - Anticoagulated.  2. Acute systolic CHF/cardiogenic shock: Ischemic cardiomyopathy with infarct-related Ruben. Echo with EF 35-40%, inferolateral and basal inferior akinesis, moderate-severe eccentric likely infarct-related Ruben, normal RV.  Echo does not look bad enough to explain low output HF, concerned that Ruben may be playing a role.  TEE on 01/09 with EF 35-40%, moderate Ruben (suspect infarct-related), aortic dilatation measuring 44 mm, moderate AI. RHC on 0.25 milrinone showed RA mean 16, PA 66/26 (41), PCWP 27, PA 62%, Fick CO 5.04/ CI 2.56.  He was weaned off pressors but restarted dobutamine with persistently low  co-ox.   CVVH stopped on 1/13 but started iHD. At this point, he is tolerating iHD - Continue midodrine 10 tid.  - HD for volume management.  3. Hyponatremia: Managed via HD.  4. Valvular heart disease:  Ruben moderate in severity on TEE. Likely infarct related. Moderate AI.  5. Atrial fibrillation: First noted this admission.   S/P TEE followed DC-CV--> NSR.   - Initially in and out of AF, now maintaining SR.  - Cut back amio to 200 mg daily.  - Started eliquis 5 mg twice a day.   6. AKI on CKD 3: Baseline creatinine around 1.4 in 6/22. Peak at 3.09 and trended down, now 2.2 -> 2.6 -> 3.1 > 4.0. > 5.3 > 6.58 >7.26 > CVVH started. 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 Renal US with no hydronephrosis. CVVH started 1/11 and stopped 1/13.  However, minimal UOP and creatinine rose so started iHD on 1/14.  Suspect he will need iHD for now.  No urine output over the last 24 hours.   - S/P  AVF vs AVG with VVS. Can elevated LUE as needed.  - Has outpatient HD set up. M, W, F .  Nephrology and Renal Navigator Appreciated.  7. Active smoker: Needs to quit.  8. ETOH abuse: Needs to cut back.  9. Thrombocytopenia: Mild, pre-dates heparin initiation.  10. Hyperglycemia: - A1c 5.7       Discharge Weight : 165 pounds.  Discharge Vitals: Blood pressure (!) 106/54, pulse 60, temperature 97.8 F (36.6 C), temperature source Oral, resp. rate 15, height 5\' 11"  (1.803 m), weight 75.1 kg, SpO2 92 %.  Labs: Lab Results  Component Value Date   WBC 7.4 05/28/2021   HGB 12.0 (L) 05/28/2021   HCT 35.2 (L) 05/28/2021   MCV 91.9 05/28/2021   PLT 123 (L) 05/28/2021    Recent Labs  Lab 05/28/21 0500  NA 134*  K 4.4  CL 97*  CO2 25  BUN 34*  CREATININE 8.53*  CALCIUM 9.2  GLUCOSE 107*   Lab Results  Component Value Date   CHOL 160 04/30/2021   HDL 59 04/30/2021   LDLCALC 93 04/30/2021   TRIG 41 04/30/2021   BNP (last 3 results) Recent Labs    04/29/21 1251  BNP 1,659.6*    ProBNP (last 3 results) No results for input(s): PROBNP in the last 8760 hours.   Diagnostic Studies/Procedures   No results found.  Discharge Medications   Allergies as of 05/28/2021       Reactions   Other Other (See Comments)   Pollen - unknown        Medication List     STOP taking these medications    allopurinol 100 MG tablet Commonly  known as: ZYLOPRIM   aspirin 81 MG tablet   buPROPion 150 MG 24 hr tablet Commonly known as: Wellbutrin XL   LINZESS PO   Meloxicam 15 MG Tbdp   methocarbamol 500 MG tablet Commonly known as: ROBAXIN   olmesartan 40 MG tablet Commonly known as: BENICAR   simvastatin 40 MG tablet Commonly known as: ZOCOR   tadalafil 5 MG tablet Commonly known as: CIALIS       TAKE these medications    acetaminophen 650 MG CR tablet Commonly known as: TYLENOL Take 650 mg by mouth every 8 (eight) hours as needed for pain.   albuterol (2.5 MG/3ML) 0.083% nebulizer solution Commonly known as: PROVENTIL Take 3 mLs (2.5 mg total) by nebulization every 4 (four) hours as  needed for wheezing or shortness of breath.   amiodarone 200 MG tablet Commonly known as: PACERONE Take 1 tablet (200 mg total) by mouth daily.   atorvastatin 80 MG tablet Commonly known as: LIPITOR Take 1 tablet (80 mg total) by mouth daily.   cholecalciferol 25 MCG (1000 UNIT) tablet Commonly known as: VITAMIN D3 Take 1,000 Units by mouth daily.   diclofenac Sodium 1 % Gel Commonly known as: VOLTAREN Apply 2 g topically 4 (four) times daily. What changed:  when to take this reasons to take this   Eliquis 5 MG Tabs tablet Generic drug: apixaban Take 1 tablet (5 mg total) by mouth 2 (two) times daily.   Magnesium 200 MG Tabs Take 1 tablet (200 mg total) by mouth daily. With evening meal What changed: additional instructions   midodrine 10 MG tablet Commonly known as: PROAMATINE Take 1 tablet (10 mg total) by mouth 3 (three) times daily with meals.   multivitamin Tabs tablet Take 1 tablet by mouth at bedtime.   nitroGLYCERIN 0.4 MG SL tablet Commonly known as: Nitrostat Place 1 tablet (0.4 mg total) under the tongue every 5 (five) minutes as needed for chest pain.   polyethylene glycol powder 17 GM/SCOOP powder Commonly known as: GLYCOLAX/MIRALAX Take 17 g by mouth 2 (two) times daily as needed.                Durable Medical Equipment  (From admission, onward)           Start     Ordered   05/28/21 1113  For home use only DME oxygen  Once       Comments: POC  Question Answer Comment  Length of Need Lifetime   Mode or (Route) Nasal cannula   Liters per Minute 2   Frequency Continuous (stationary and portable oxygen unit needed)   Oxygen delivery system Gas      05/28/21 1113   05/27/21 1412  For home use only DME 3 n 1  Once        05/27/21 1411   05/27/21 1411  For home use only DME 4 wheeled rolling walker with seat  Once       Question Answer Comment  Patient needs a walker to treat with the following condition CHF (congestive heart failure) ([place])   Patient needs a walker to treat with the following condition ESRD (end stage renal disease) ([place])      05/27/21 1411            Disposition   The patient will be discharged in stable condition to home. Discharge Instructions     AMB Referral to [facility] Coordinaton   Complete by: As directed    Embedded:  [facility],  [name], FNP, is listed to provide the [facility] call and follow up   Please refer patient for Embedded chronic care management follow up calls and assess for further needs.  Questions please call:   Natividad Brood, RN BSN [facility] Liaison  [phone] business mobile phone Toll free office [phone]  Fax number: [phone] [email or web address]   Reason for Referral: Embedded Chronic Care Management Services (Dept. specific)   Disease management services needed:  Nurse Case Manager Pharmacy     Reason for Consult: Medication Management   Diagnoses of:  Kidney Failure Other     Other Diagnosis: Acute MI; LLOS 28 days hospital   Expected date of contact: Routine - 30 Days  Amb Referral to Cardiac Rehabilitation   Complete by: As directed    Diagnosis: NSTEMI   After  initial evaluation and assessments completed: Virtual Based Care may be provided alone or in conjunction with Phase 2 Cardiac Rehab based on patient barriers.: Yes   Diet - low sodium heart healthy   Complete by: As directed    Heart Failure patients record your daily weight using the same scale at the same time of day   Complete by: As directed    Increase activity slowly   Complete by: As directed        [place] Follow up.   Why: Schedule is Monday, Wednesday, Friday with 11:00 chair time.  For first appointment, pt needs to arrive at 10:15 to complete paperwork. Pt needs to bring picture ID and insurance card. After first appointment, pt will need to arrive at 10:30-10:45. Contact information: [place] [address] [phone]         [facility] Follow up on 06/03/2021.   Specialty: Cardiology Why: [facility] 3:30 pm Entrance C, Garage Code 1202 Contact information: [address] [id number] [place] [address] [phone]        [facility] Follow up.   Why: 2-3 weeks. The office will call the patient with an appointment(sent) Contact information: [address] [place] [phone]                  Duration of Discharge Encounter: Greater than 35 minutes   Signed, Darrick Grinder NP-C  05/28/2021, 1:46 PM   Patient seen with NP, agree with the above note.   Clinically stable today and ready for discharge.  He has outpatient seat for HD starting tomorrow in [place].   Loralie Champagne 05/28/2021

## 2021-05-13 NOTE — Anesthesia Preprocedure Evaluation (Signed)
Anesthesia Evaluation  Patient identified by MRN, date of birth, ID band Patient awake    Reviewed: Allergy & Precautions, NPO status , Patient's Chart, lab work & pertinent test results  Airway Mallampati: II  TM Distance: >3 FB Neck ROM: Full    Dental  (+) Dental Advisory Given, Upper Dentures   Pulmonary Current Smoker and Patient abstained from smoking.,    Pulmonary exam normal breath sounds clear to auscultation       Cardiovascular hypertension, Pt. on medications + Past MI  Normal cardiovascular exam+ dysrhythmias Atrial Fibrillation  Rhythm:Regular Rate:Normal     Neuro/Psych negative neurological ROS     GI/Hepatic negative GI ROS, Neg liver ROS,   Endo/Other  negative endocrine ROS  Renal/GU Renal InsufficiencyRenal disease     Musculoskeletal  (+) Arthritis ,   Abdominal   Peds  Hematology negative hematology ROS (+)   Anesthesia Other Findings Day of surgery medications reviewed with the patient.  Reproductive/Obstetrics                             Anesthesia Physical Anesthesia Plan  ASA: 3  Anesthesia Plan: General   Post-op Pain Management:    Induction: Intravenous  PONV Risk Score and Plan: 1 and Treatment may vary due to age or medical condition and TIVA  Airway Management Planned:   Additional Equipment:   Intra-op Plan:   Post-operative Plan:   Informed Consent: I have reviewed the patients History and Physical, chart, labs and discussed the procedure including the risks, benefits and alternatives for the proposed anesthesia with the patient or authorized representative who has indicated his/her understanding and acceptance.     Dental advisory given  Plan Discussed with: CRNA  Anesthesia Plan Comments:         Anesthesia Quick Evaluation

## 2021-05-13 NOTE — Procedures (Signed)
Electrical Cardioversion Procedure Note Ruben [name] [id number] Jan 22, 1946  Procedure: Electrical Cardioversion Indications:  Atrial Fibrillation  Procedure Details Consent: Risks of procedure as well as the alternatives and risks of each were explained to the (patient/caregiver).  Consent for procedure obtained. Time Out: Verified patient identification, verified procedure, site/side was marked, verified correct patient position, special equipment/implants available, medications/allergies/relevent history reviewed, required imaging and test results available.  Performed  Patient placed on cardiac monitor, pulse oximetry, supplemental oxygen as necessary.  Sedation given:  Propofol per anesthesiology Pacer pads placed anterior and posterior chest.  Cardioverted 1 time(s).  Cardioverted at [place].  Evaluation Findings: Post procedure EKG shows: NSR Complications: None Patient did tolerate procedure well.   Loralie Champagne 05/13/2021, 1:34 PM

## 2021-05-13 NOTE — Progress Notes (Signed)
Seen on afternoon rounds.  S/p successful TEE/DCCV this afternoon.  170 cc UOP charted for today. Discussed with RN. Good output noted. At least 1 full urinal emptied.   Strict Is/Os. Will continue to follow closely.  BMET in am.

## 2021-05-13 NOTE — CV Procedure (Signed)
Procedure: TEE  Sedation: Per anesthesiology  Indication: Atrial fibrillation.    Findings: Please see echo section for full report.  Difficult study due to coughing.  Normal LV size with EF 35-40%, basal to mid inferior and inferolateral akinesis.  Peak RV-RA gradient 23 mmHg. Normal RV size and systolic function.  Mildly dilated LA, no LA appendage thrombus.  Normal RA.  No ASD or PFO by color doppler.  Trileaflet aortic valve with no stenosis, moderate regurgitation.  Trivial TR.  There was moderate, likely infarct-related, mitral regurgitation.  PISA ERO 0.09 cm^2 (suspect underestimation).  Normal caliber thoracic aorta with grade IV plaque in the arch.   Impression: No LA appendage thrombus, may proceed to DCCV.   Ruben [name] 05/13/2021 1:34 PM

## 2021-05-13 NOTE — H&P (View-Only) (Signed)
Patient ID: Ruben [name], male   DOB: [birth year], 76 y.o.   MRN: [id number]     Advanced Heart Failure Rounding Note  PCP-Cardiologist: Sanda Klein, MD   Subjective:   12/26: Admit with late presentation MI. New AF 12/28: CGS initial co-ox 47%. Started NE + mil. 01/03: SR 05/08/20 Back in A fib and milrinone was cut back to 0.125 mcg.  05/09/20 Milrinone increased back to 0.25. Volume overload, elevated filling pressures on RHC with preserved CO on milrinone. Hypertensive. Back in ICU on nitro gtt  LHC 1/5: occluded RCA (culprit), diffuse disease in LAD and Lcx. Seen by CVTS. Not felt to be a candidate for surgery.   Co-ox 62% early am on 0.125 milrinone. Milrinone now off.   CVP 7-8. Off diuretics.  Scr 2.0>2.2 > 2.6 > 3.1 > 4.00  Na 135>129>131>130>130  Remains in AFL/AF on amio gtt, rate 70s-80s. On 5 mg eliquis BID.  Feels good. Lying comfortably in bed. Ambulated halls twice yesterday. No CP or dyspnea.    Objective:   Weight Range: 77.8 kg Body mass index is 23.92 kg/m.   Vital Signs:   Temp:  [97.7 F (36.5 C)-98.1 F (36.7 C)] 97.9 F (36.6 C) (01/09 0400) Pulse Rate:  [74-106] 82 (01/09 0401) Resp:  [12-21] 18 (01/09 0401) BP: (96-127)/(59-71) 112/71 (01/09 0400) SpO2:  [88 %-95 %] 92 % (01/09 0401) Weight:  [77.8 kg] 77.8 kg (01/09 0401) Last BM Date: 05/12/21  Weight change: Filed Weights   05/11/21 0600 05/12/21 0500 05/13/21 0401  Weight: 76.8 kg 76.5 kg 77.8 kg    Intake/Output:   Intake/Output Summary (Last 24 hours) at 05/13/2021 0700 Last data filed at 05/13/2021 0300 Gross per 24 hour  Intake 563.07 ml  Output --  Net 563.07 ml      Physical Exam    CVP 7-8 General:  Lying comfortably in bed HEENT: normal Neck: supple. JVP ~ 8 cm. Carotids 2+ bilat; no bruits.  Cor: PMI nondisplaced. Irregular rhythm. No rubs, gallops, 2/6 HSM Lungs: clear Abdomen: soft, nontender, nondistended. No hepatosplenomegaly. No bruits or masses.  Good bowel sounds. Extremities: no cyanosis, clubbing, rash, edema Neuro: alert & orientedx3, cranial nerves grossly intact. moves all 4 extremities w/o difficulty. Affect pleasant    Telemetry   AF 70s-80s (personally reviewed)   Labs    CBC Recent Labs    05/12/21 0421 05/13/21 0355  WBC 6.6 7.1  HGB 12.7* 12.9*  HCT 36.9* 36.8*  MCV 93.9 92.7  PLT 167 701   Basic Metabolic Panel Recent Labs    05/11/21 0500 05/12/21 0421 05/13/21 0355  NA 131* 130* 130*  K 4.0 4.0 3.9  CL 98 92* 94*  CO2 25 26 25   GLUCOSE 160* 206* 99  BUN 26* 28* 34*  CREATININE 2.60* 3.08* 4.00*  CALCIUM 8.6* 8.8* 8.6*  MG 1.9 1.8  --    Liver Function Tests No results for input(s): AST, ALT, ALKPHOS, BILITOT, PROT, ALBUMIN in the last 72 hours.  No results for input(s): LIPASE, AMYLASE in the last 72 hours. Cardiac Enzymes No results for input(s): CKTOTAL, CKMB, CKMBINDEX, TROPONINI in the last 72 hours.  BNP: BNP (last 3 results) Recent Labs    04/29/21 1251  BNP 1,659.6*    ProBNP (last 3 results) No results for input(s): PROBNP in the last 8760 hours.   D-Dimer No results for input(s): DDIMER in the last 72 hours. Hemoglobin A1C No results for input(s): HGBA1C in the last 72  hours. Fasting Lipid Panel No results for input(s): CHOL, HDL, LDLCALC, TRIG, CHOLHDL, LDLDIRECT in the last 72 hours. Thyroid Function Tests No results for input(s): TSH, T4TOTAL, T3FREE, THYROIDAB in the last 72 hours.  Invalid input(s): FREET3  Other results:   Imaging    No results found.   Medications:     Scheduled Medications:  apixaban  5 mg Oral BID   atorvastatin  80 mg Oral Daily   Chlorhexidine Gluconate Cloth  6 each Topical Daily   folic acid  1 mg Oral Daily   insulin aspart  0-5 Units Subcutaneous QHS   insulin aspart  0-9 Units Subcutaneous TID WC   mouth rinse  15 mL Mouth Rinse BID   multivitamin with minerals  1 tablet Oral Daily   senna-docusate  1 tablet Oral  BID   sodium chloride flush  10-40 mL Intracatheter Q12H   sodium chloride flush  3 mL Intravenous Q12H   sodium chloride flush  3 mL Intravenous Q12H   thiamine  100 mg Oral Daily    Infusions:  sodium chloride 10 mL/hr at 05/12/21 2313   sodium chloride     sodium chloride     amiodarone 30 mg/hr (05/13/21 0300)   milrinone 0.125 mcg/kg/min (05/13/21 0300)    PRN Medications: sodium chloride, sodium chloride, acetaminophen, ALPRAZolam, ondansetron (ZOFRAN) IV, polyethylene glycol, sodium chloride flush, sodium chloride flush, sodium chloride flush    Assessment/Plan   1. CAD: Suspect delayed presentation LCx (versus RCA) MI.  Echo with EF 35-40%, inferolateral and basal inferior akinesis, moderate-severe eccentric likely infarct-related MR, normal RV.  He was not cathed initially due to AKI and delayed presentation. No s/s angina - LHC with occluded RCA (culprit), diffuse moderate disease in LAD and Lcx. No good PCI targets. Not a candidate for CABG. - Continue statin - Continue aspirin 2. Acute systolic CHF/cardiogenic shock: Ischemic cardiomyopathy with infarct-related MR. Echo with EF 35-40%, inferolateral and basal inferior akinesis, moderate-severe eccentric likely infarct-related MR, normal RV.  Echo does not look bad enough to explain low output HF, concerned that MR may be playing a role.  TEE on 01/03 with EF 35-40%, moderate MR (suspect infarct-related), aortic dilatation measuring 44 mm, moderate AI.  - RHC on 0.25 mil: RA mean 16, PA 66/26 (41), PCWP 27, PA 62%, Fick CO 5.04/ CI 2.56  - Co-ox 62% early am on 0.125 milrinone. Mil now off early this morning. - CVP 7-8. Remains off diuretics.  - GDMT limited by AKI - No beta blocker or other GDMT for now with recent shock and AKI 3. Hyponatremia: 129->131 -> 130 restrict FW - Monitor 4. Valvular heart disease: - MR moderate in severity on TEE. Likely infarct related. Moderate AI. EF 35-40% 5. Atrial fibrillation: First  noted this admission.  Remains in A fib. Rate controlled on amio drip at 30. On apixaban - TEE/DCCV later today 6. AKI on CKD 3: Baseline creatinine around 1.4 in 6/22. Peak at 3.09 and trended down, now 2.2 -> 2.6 -> 3.1 > 4.0. ? ATN vs CIN post LHC - Follow closely. Need strict Is/Os.  - BP intermittently soft. Consider midodrine if SBP consistently low. - May need nephrology consult if continues to worsen 7. Active smoker: Needs to quit.  8. ETOH abuse: Needs to cut back.  9. Thrombocytopenia: Mild, pre-dates heparin initiation. Resolved. 10. Hyperglycemia: - Continue SSI  - A1c 5.7   Length of Stay: [place], LINDSAY N, PA-C  05/13/2021, 7:00 AM  Advanced Heart Failure Team Pager [phone] (M-F; 7a - 5p)  Please contact [facility] for night-coverage after hours (5p -7a ) and weekends on [email or web address]  Patient seen with PA, agree with the above note.    He remains in atrial fibrillation today, rate 100s.  SBP 90s-100s.  Creatinine higher at 4.0.  UOP not recorded.  Co-ox stable at 62%. CVP 7-8.   No complaints today.   General: NAD Neck: No JVD, no thyromegaly or thyroid nodule.  Lungs: Clear to auscultation bilaterally with normal respiratory effort. CV: Nondisplaced PMI.  Heart irregular S1/S2, no S3/S4, no murmur.  No peripheral edema.   Abdomen: Soft, nontender, no hepatosplenomegaly, no distention.  Skin: Intact without lesions or rashes.  Neurologic: Alert and oriented x 3.  Psych: Normal affect. Extremities: No clubbing or cyanosis.  HEENT: Normal.   CVP 7-8 with creatinine higher.  Possible ATN progression versus CIN from cath contrast (only about 35 cc).   - Add low dose midodrine for now to keep BP up.  - No diuretic.  - Follow creatinine closely and need to document UOP.   He remains in atrial fibrillation. Plan for TEE-guided DCCV later today.  Can reassess MR on TEE now that he is off milrinone. Discussed risks/benefits and patient agrees to procedure.    Loralie Champagne 05/13/2021 8:30 AM

## 2021-05-13 NOTE — Progress Notes (Signed)
°  Echocardiogram Echocardiogram Transesophageal has been performed.  Ruben [name] 05/13/2021, 1:37 PM

## 2021-05-13 NOTE — Plan of Care (Signed)
  Problem: Education: Goal: Knowledge of General Education information will improve Description: Including pain rating scale, medication(s)/side effects and non-pharmacologic comfort measures Outcome: Progressing   Problem: Health Behavior/Discharge Planning: Goal: Ability to manage health-related needs will improve Outcome: Progressing   Problem: Clinical Measurements: Goal: Cardiovascular complication will be avoided Outcome: Progressing   Problem: Activity: Goal: Risk for activity intolerance will decrease Outcome: Progressing   Problem: Nutrition: Goal: Adequate nutrition will be maintained Outcome: Progressing   Problem: Pain Managment: Goal: General experience of comfort will improve Outcome: Progressing   

## 2021-05-13 NOTE — Interval H&P Note (Signed)
History and Physical Interval Note:  05/13/2021 1:05 PM  Ruben [name]  has presented today for surgery, with the diagnosis of AFIB.  The various methods of treatment have been discussed with the patient and family. After consideration of risks, benefits and other options for treatment, the patient has consented to  Procedure(s): TRANSESOPHAGEAL ECHOCARDIOGRAM (TEE) (N/A) CARDIOVERSION (N/A) as a surgical intervention.  The patient's history has been reviewed, patient examined, no change in status, stable for surgery.  I have reviewed the patient's chart and labs.  Questions were answered to the patient's satisfaction.     Immanuel Fedak [employer]

## 2021-05-14 ENCOUNTER — Inpatient Hospital Stay (HOSPITAL_COMMUNITY): Payer: Medicare Other

## 2021-05-14 ENCOUNTER — Encounter (HOSPITAL_COMMUNITY): Payer: Self-pay | Admitting: Cardiology

## 2021-05-14 DIAGNOSIS — N179 Acute kidney failure, unspecified: Secondary | ICD-10-CM | POA: Diagnosis not present

## 2021-05-14 LAB — URINALYSIS, ROUTINE W REFLEX MICROSCOPIC
Bilirubin Urine: NEGATIVE
Glucose, UA: NEGATIVE mg/dL
Hgb urine dipstick: NEGATIVE
Ketones, ur: NEGATIVE mg/dL
Leukocytes,Ua: NEGATIVE
Nitrite: NEGATIVE
Protein, ur: NEGATIVE mg/dL
Specific Gravity, Urine: >1.03 — ABNORMAL HIGH (ref 1.005–1.030)
pH: 5.5 (ref 5.0–8.0)

## 2021-05-14 LAB — GLUCOSE, CAPILLARY
Glucose-Capillary: 100 mg/dL — ABNORMAL HIGH (ref 70–99)
Glucose-Capillary: 127 mg/dL — ABNORMAL HIGH (ref 70–99)
Glucose-Capillary: 121 mg/dL — ABNORMAL HIGH (ref 70–99)
Glucose-Capillary: 109 mg/dL — ABNORMAL HIGH (ref 70–99)

## 2021-05-14 LAB — COOXEMETRY PANEL
Carboxyhemoglobin: 1 % (ref 0.5–1.5)
Methemoglobin: 0.6 % (ref 0.0–1.5)
O2 Saturation: 55.5 %
Total hemoglobin: 12.7 g/dL (ref 12.0–16.0)

## 2021-05-14 LAB — BASIC METABOLIC PANEL
Anion gap: 11 (ref 5–15)
BUN: 39 mg/dL — ABNORMAL HIGH (ref 8–23)
CO2: 25 mmol/L (ref 22–32)
Calcium: 8.4 mg/dL — ABNORMAL LOW (ref 8.9–10.3)
Chloride: 96 mmol/L — ABNORMAL LOW (ref 98–111)
Creatinine, Ser: 5.27 mg/dL — ABNORMAL HIGH (ref 0.61–1.24)
GFR, Estimated: 11 mL/min — ABNORMAL LOW (ref >60)
Glucose, Bld: 107 mg/dL — ABNORMAL HIGH (ref 70–99)
Potassium: 4.1 mmol/L (ref 3.5–5.1)
Sodium: 132 mmol/L — ABNORMAL LOW (ref 135–145)

## 2021-05-14 LAB — CBC
HCT: 34.7 % — ABNORMAL LOW (ref 39.0–52.0)
Hemoglobin: 12.2 g/dL — ABNORMAL LOW (ref 13.0–17.0)
MCH: 32.6 pg (ref 26.0–34.0)
MCHC: 35.2 g/dL (ref 30.0–36.0)
MCV: 92.8 fL (ref 80.0–100.0)
Platelets: 172 10*3/uL (ref 150–400)
RBC: 3.74 MIL/uL — ABNORMAL LOW (ref 4.22–5.81)
RDW: 13.4 % (ref 11.5–15.5)
WBC: 6.7 10*3/uL (ref 4.0–10.5)
nRBC: 0 % (ref 0.0–0.2)

## 2021-05-14 MED ORDER — SODIUM CHLORIDE 0.9 % IV BOLUS
250.0000 mL | Freq: Once | INTRAVENOUS | Status: AC
  Administered 2021-05-14: 250 mL via INTRAVENOUS

## 2021-05-14 MED ORDER — AMIODARONE HCL 200 MG PO TABS
200.0000 mg | ORAL_TABLET | Freq: Two times a day (BID) | ORAL | Status: DC
  Administered 2021-05-14 (×2): 200 mg via ORAL
  Filled 2021-05-14 (×2): qty 1

## 2021-05-14 NOTE — Progress Notes (Signed)
CARDIAC REHAB PHASE I   PRE:  Rate/Rhythm: 76 SR    BP: sitting 131/73    SaO2: 96 RA  MODE:  Ambulation: 680 ft   POST:  Rate/Rhythm: 98 SR    BP: sitting 141/71     SaO2: 98 RA  Pt ambulated independently at slow pace. No c/o. To EOB to bathe.  [id number]   [place], ACSM 05/14/2021 10:26 AM

## 2021-05-14 NOTE — Progress Notes (Cosign Needed)
° °  Asked to provide his daughter with an update. I spoke to her in his room and discussed current issues and plan.   Discussed renal ultra sound and nephrology consult.  Also discussed that he remains in a Sinus Rhythm.   He is not ready for d/c . Asked nursing staff to check bladder scan.   Raheen Capili NP-C  3:56 PM

## 2021-05-14 NOTE — TOC Benefit Eligibility Note (Signed)
Transition of Care [facility]) Benefit Eligibility Note    Patient Details  Name: Coben Godshall MRN: [id number] Date of Birth: 28-Aug-1945   Medication/Dose: Arne Cleveland  2.5 MG BID : CO-PAY- $47.00   and   ELIQUIS  5 MG  ID : CO-PAY- $47.00  Covered?: Yes  Tier: 3 Drug  Prescription Coverage Preferred Pharmacy: Roseanne Kaufman with Person/Company/Phone Number:: [facility]   @  [employer] YX #  [phone]  Co-Pay: $ 47.00  Prior Approval: No  Deductible: Met (OUT-OF-POCKET : UNMET)  Additional Notes: ELIQUIS  10 MG  : NON-FORMULARY    Memory Argue Phone Number: 05/14/2021, 12:55 PM

## 2021-05-14 NOTE — Consult Note (Signed)
[facility]  HISTORY AND PHYSICAL  Ruben [name] is an 76 y.o. male.    Chief Complaint: Chest pain  HPI: Pt is a 70M with a PMH sig for CKD Stage 3b (sees Dr Posey Pronto), HTN, HLD, CAD, EtOH abuse, HLD, tobacco abuse who is now seen in consultation at the request of Dr. Aundra Dubin for eval and recs regarding AKI on CKD.  Pt presented to [facility] 12/26 for a 2 day history of CP and fatigue. EKG done, late- presenting MI with trops > 20,000.  Was initially in respiratory distress on BiPaP.  Initial TTE 04/30/21 showing EF 35-40%, nromal RV function, mod-severe MR, mild AI.  Had new onset Afib, cardiogenic shock, was on pressors/ inotropes initially.  Had [place] 1/5, also had hypertensive emergency and on nitro gtt, had LHC 05/09/21 which showed 3 vessel disease, not felt to be candidate for surgery per CVTS.  Had TEE and cardioversion yesterday 05/13/21.  In this setting we are asked to see.   Creatinine trend as follows:  04/29/21: 1.68 05/05/21: 2.49 05/09/21: 2.00 05/10/21: 2.20 05/11/21: 22.60 05/12/21: 3.08 05/13/21: 4.00 05/14/21: 5.27  He is not making a lot of urine.  Had contrast load 05/09/21.  BP has been relatively low, on midodrine now.  Also had hypertensive emergency as well 05/09/21 necessitating a trip back to ICU for nitro gtt   Was on Benicar as OP.  Meloxicam also noted on home meds but pt states he is not taking.    No CP now.  No f/c, n/v, SOB.  No LE edema. Renal US today 05/14/21 without obstruction.    PMH: Past Medical History:  Diagnosis Date   Arthritis    Chronic kidney disease, stage 3b ([place])    ETOH abuse    History of stress test 09/02/2008   showed inferolateral scar without ischemia   Hx of echocardiogram 09/02/2008   was essentially normal   Hyperlipidemia    Hypertension    Tobacco abuse    PSH: Past Surgical History:  Procedure Laterality Date   BACK SURGERY     Dr Luiz Ochoa involving L3-L4 discectomy.   HERNIA REPAIR     RIGHT/LEFT HEART CATH AND  CORONARY ANGIOGRAPHY N/A 05/09/2021   Procedure: RIGHT/LEFT HEART CATH AND CORONARY ANGIOGRAPHY;  Surgeon: Larey Dresser, MD;  Location: [facility];  Service: Cardiovascular;  Laterality: N/A;   SPINE SURGERY     TEE WITHOUT CARDIOVERSION N/A 05/07/2021   Procedure: TRANSESOPHAGEAL ECHOCARDIOGRAM (TEE);  Surgeon: Larey Dresser, MD;  Location: [facility] ENDOSCOPY;  Service: Cardiovascular;  Laterality: N/A;     Past Medical History:  Diagnosis Date   Arthritis    Chronic kidney disease, stage 3b ([place])    ETOH abuse    History of stress test 09/02/2008   showed inferolateral scar without ischemia   Hx of echocardiogram 09/02/2008   was essentially normal   Hyperlipidemia    Hypertension    Tobacco abuse     Medications:  Scheduled:  amiodarone  200 mg Oral BID   apixaban  5 mg Oral BID   atorvastatin  80 mg Oral Daily   Chlorhexidine Gluconate Cloth  6 each Topical Daily   folic acid  1 mg Oral Daily   mouth rinse  15 mL Mouth Rinse BID   midodrine  5 mg Oral TID WC   multivitamin with minerals  1 tablet Oral Daily   senna-docusate  1 tablet Oral BID   sodium chloride  flush  10-40 mL Intracatheter Q12H   sodium chloride flush  3 mL Intravenous Q12H   sodium chloride flush  3 mL Intravenous Q12H   thiamine  100 mg Oral Daily    Medications Prior to Admission  Medication Sig Dispense Refill   acetaminophen (TYLENOL) 650 MG CR tablet Take 650 mg by mouth every 8 (eight) hours as needed for pain.     allopurinol (ZYLOPRIM) 100 MG tablet TAKE 1 TABLET(100 MG) BY MOUTH TWICE DAILY (Patient taking differently: Take 100 mg by mouth 2 (two) times daily.) 180 tablet 1   aspirin 81 MG tablet Take 81 mg by mouth daily.     buPROPion (WELLBUTRIN XL) 150 MG 24 hr tablet Take 1 tablet (150 mg total) by mouth every morning. 90 tablet 1   cholecalciferol (VITAMIN D3) 25 MCG (1000 UNIT) tablet Take 1,000 Units by mouth daily.     diclofenac Sodium (VOLTAREN) 1 % GEL Apply 2 g topically 4  (four) times daily. (Patient taking differently: Apply 2 g topically daily as needed (pain).) 100 g 2   linaCLOtide (LINZESS PO) Take 1 tablet by mouth daily. Not sure about mg; got samples from doctor     Magnesium 200 MG TABS Take 1 tablet (200 mg total) by mouth daily. With evening meal (Patient taking differently: Take 200 mg by mouth daily.) 30 tablet 2   methocarbamol (ROBAXIN) 500 MG tablet TAKE 1 TABLET(500 MG) BY MOUTH EVERY 8 HOURS AS NEEDED FOR MUSCLE SPASMS (Patient taking differently: Take 500 mg by mouth every 8 (eight) hours as needed for muscle spasms.) 20 tablet 1   olmesartan (BENICAR) 40 MG tablet Take 1 tablet (40 mg total) by mouth daily. 30 tablet 2   simvastatin (ZOCOR) 40 MG tablet Take 1 tablet (40 mg total) by mouth every evening. 90 tablet 2   tadalafil (CIALIS) 5 MG tablet Take 1 tablet (5 mg total) by mouth as needed for erectile dysfunction. (Patient taking differently: Take 5 mg by mouth daily as needed for erectile dysfunction.) 30 tablet 2   albuterol (PROVENTIL) (2.5 MG/3ML) 0.083% nebulizer solution Take 3 mLs (2.5 mg total) by nebulization every 4 (four) hours as needed for wheezing or shortness of breath. (Patient not taking: Reported on 04/29/2021) 75 mL 2   Meloxicam 15 MG TBDP Take 1 tablet by mouth daily. For 5 days then take once a day as needed (Patient not taking: Reported on 04/29/2021) 30 tablet 1   polyethylene glycol powder (GLYCOLAX/MIRALAX) powder Take 17 g by mouth 2 (two) times daily as needed. (Patient not taking: Reported on 04/29/2021) 578 g 1    ALLERGIES:   Allergies  Allergen Reactions   Other Other (See Comments)    Pollen - unknown    FAM HX: Family History  Problem Relation Age of Onset   Heart attack Brother    Diabetes Brother    Heart disease Brother    Cancer Father    Diabetes Brother    Heart disease Brother     Social History:   reports that he has been smoking cigarettes. He has a 30.00 pack-year smoking history. He  has never used smokeless tobacco. He reports that he does not drink alcohol and does not use drugs.  ROS: ROS: all other systems reviewed and are negative except as per HPI  Blood pressure 104/60, pulse 73, temperature 97.6 F (36.4 C), temperature source Oral, resp. rate 19, height $RemoveBe'5\' 11"'[id number]$  (1.803 m), weight 83.2 kg, SpO2 96 %.  PHYSICAL EXAM: Physical Exam GEN sitting up with blanket over shoulders HEENT EOMI PERRL NECK no JVD PULM clear CV RRR ABD soft EXT no LE edema    Results for orders placed or performed during the hospital encounter of 04/29/21 (from the past 48 hour(s))  Glucose, capillary     Status: Abnormal   Collection Time: 05/12/21  4:50 PM  Result Value Ref Range   Glucose-Capillary 111 (H) 70 - 99 mg/dL    Comment: Glucose reference range applies only to samples taken after fasting for at least 8 hours.  Glucose, capillary     Status: Abnormal   Collection Time: 05/12/21 10:19 PM  Result Value Ref Range   Glucose-Capillary 104 (H) 70 - 99 mg/dL    Comment: Glucose reference range applies only to samples taken after fasting for at least 8 hours.  .Cooxemetry Panel (carboxy, met, total hgb, O2 sat)     Status: Abnormal   Collection Time: 05/13/21  3:55 AM  Result Value Ref Range   Total hemoglobin 18.6 (H) 12.0 - 16.0 g/dL   O2 Saturation 62.4 %   Carboxyhemoglobin 0.7 0.5 - 1.5 %   Methemoglobin 0.8 0.0 - 1.5 %    Comment: Performed at [facility] [address], [place], [place] [address]  Basic metabolic panel     Status: Abnormal   Collection Time: 05/13/21  3:55 AM  Result Value Ref Range   Sodium 130 (L) 135 - 145 mmol/L   Potassium 3.9 3.5 - 5.1 mmol/L   Chloride 94 (L) 98 - 111 mmol/L   CO2 25 22 - 32 mmol/L   Glucose, Bld 99 70 - 99 mg/dL    Comment: Glucose reference range applies only to samples taken after fasting for at least 8 hours.   BUN 34 (H) 8 - 23 mg/dL   Creatinine, Ser 4.00 (H) 0.61 - 1.24 mg/dL   Calcium 8.6 (L) 8.9 - 10.3  mg/dL   GFR, Estimated 15 (L) >60 mL/min    Comment: (NOTE) Calculated using the CKD-EPI Creatinine Equation (2021)    Anion gap 11 5 - 15    Comment: Performed at [facility] [address] [place], [place] [address]  CBC     Status: Abnormal   Collection Time: 05/13/21  3:55 AM  Result Value Ref Range   WBC 7.1 4.0 - 10.5 K/uL   RBC 3.97 (L) 4.22 - 5.81 MIL/uL   Hemoglobin 12.9 (L) 13.0 - 17.0 g/dL   HCT 36.8 (L) 39.0 - 52.0 %   MCV 92.7 80.0 - 100.0 fL   MCH 32.5 26.0 - 34.0 pg   MCHC 35.1 30.0 - 36.0 g/dL   RDW 13.3 11.5 - 15.5 %   Platelets 177 150 - 400 K/uL   nRBC 0.0 0.0 - 0.2 %    Comment: Performed at [facility], [place] [address], [place], [place] [address]  Glucose, capillary     Status: Abnormal   Collection Time: 05/13/21  6:45 AM  Result Value Ref Range   Glucose-Capillary 106 (H) 70 - 99 mg/dL    Comment: Glucose reference range applies only to samples taken after fasting for at least 8 hours.  Glucose, capillary     Status: Abnormal   Collection Time: 05/13/21 11:38 AM  Result Value Ref Range   Glucose-Capillary 108 (H) 70 - 99 mg/dL    Comment: Glucose reference range applies only to samples taken after fasting for at  least 8 hours.  Glucose, capillary     Status: None   Collection Time: 05/13/21  4:23 PM  Result Value Ref Range   Glucose-Capillary 98 70 - 99 mg/dL    Comment: Glucose reference range applies only to samples taken after fasting for at least 8 hours.  Glucose, capillary     Status: Abnormal   Collection Time: 05/13/21  9:05 PM  Result Value Ref Range   Glucose-Capillary 128 (H) 70 - 99 mg/dL    Comment: Glucose reference range applies only to samples taken after fasting for at least 8 hours.   Comment 1 Notify RN    Comment 2 Document in Chart   .Cooxemetry Panel (carboxy, met, total hgb, O2 sat)     Status: None   Collection Time: 05/14/21  4:55 AM  Result Value Ref Range   Total hemoglobin 12.7 12.0 - 16.0 g/dL   O2  Saturation 55.5 %   Carboxyhemoglobin 1.0 0.5 - 1.5 %   Methemoglobin 0.6 0.0 - 1.5 %    Comment: Performed at [facility], [place] [address], [place], [place] [address]  Basic metabolic panel     Status: Abnormal   Collection Time: 05/14/21  4:55 AM  Result Value Ref Range   Sodium 132 (L) 135 - 145 mmol/L   Potassium 4.1 3.5 - 5.1 mmol/L   Chloride 96 (L) 98 - 111 mmol/L   CO2 25 22 - 32 mmol/L   Glucose, Bld 107 (H) 70 - 99 mg/dL    Comment: Glucose reference range applies only to samples taken after fasting for at least 8 hours.   BUN 39 (H) 8 - 23 mg/dL   Creatinine, Ser 5.27 (H) 0.61 - 1.24 mg/dL   Calcium 8.4 (L) 8.9 - 10.3 mg/dL   GFR, Estimated 11 (L) >60 mL/min    Comment: (NOTE) Calculated using the CKD-EPI Creatinine Equation (2021)    Anion gap 11 5 - 15    Comment: Performed at [facility] [address], [place], [place] [address]  CBC     Status: Abnormal   Collection Time: 05/14/21  4:55 AM  Result Value Ref Range   WBC 6.7 4.0 - 10.5 K/uL   RBC 3.74 (L) 4.22 - 5.81 MIL/uL   Hemoglobin 12.2 (L) 13.0 - 17.0 g/dL   HCT 34.7 (L) 39.0 - 52.0 %   MCV 92.8 80.0 - 100.0 fL   MCH 32.6 26.0 - 34.0 pg   MCHC 35.2 30.0 - 36.0 g/dL   RDW 13.4 11.5 - 15.5 %   Platelets 172 150 - 400 K/uL   nRBC 0.0 0.0 - 0.2 %    Comment: Performed at [facility], [place] [address], [place], [place] [address]  Glucose, capillary     Status: Abnormal   Collection Time: 05/14/21  6:38 AM  Result Value Ref Range   Glucose-Capillary 100 (H) 70 - 99 mg/dL    Comment: Glucose reference range applies only to samples taken after fasting for at least 8 hours.  Glucose, capillary     Status: Abnormal   Collection Time: 05/14/21 11:17 AM  Result Value Ref Range   Glucose-Capillary 127 (H) 70 - 99 mg/dL    Comment: Glucose reference range applies only to samples taken after fasting for at least 8 hours.    US RENAL  Result Date: 05/14/2021 CLINICAL DATA:  Acute kidney injury.  EXAM: RENAL / URINARY TRACT ULTRASOUND COMPLETE COMPARISON:  None.  FINDINGS: Right Kidney: Renal measurements: 11.6 x 5.7 x 5.7 cm = volume: 199 mL. Increased echogenicity of renal parenchyma is noted. 6 mm nonobstructive calculus is seen in upper pole. Two simple cysts are noted, the largest measuring 2.1 cm. No mass or hydronephrosis visualized. Left Kidney: Renal measurements: 12.0 x 6.3 x 6.3 cm = volume: 247 mL. Increased echogenicity is noted suggesting medical renal disease. 2 simple cysts are noted, the largest measuring 1.9 cm. No mass or hydronephrosis visualized. Bladder: Appears normal for degree of bladder distention. Other: Moderately enlarged prostate gland is noted. IMPRESSION: Increased echogenicity of renal parenchyma is noted bilaterally consistent with medical renal disease. Bilateral simple renal cysts are noted. No hydronephrosis or renal obstruction is noted. Nonobstructive right renal calculus is noted. Moderate prostatic enlargement is noted. Electronically Signed   By: Marijo Conception M.D.   On: 05/14/2021 09:16    Assessment/Plan   AKI on CKD 3b:normally sees Dr Posey Pronto.  On ARB as OP.  He has had both significant hyper- and hypotensive episodes during this hospitalization requiring treatment and a contrast load on 05/09/21.  Renal US without obstruction and R 6 mm renal calculus.  UA on admission bland, will send another one.   - fortunately volume and electrolytes are OK for now - I'll send another UA- I suspect ATN + CIN combination - agree with small bolus as ordered by primary (250 mL) - no hard indication for RRT at present but will make a day-to-day determination--> I suspect he will need it in the next 24-48 hrs if UOP/ Cr do not improve - discussed with pt and dtr Reba at bedside- pt willing to undergo dialysis if needed  2.  Acute on chronic systolic CHF: EF 70-65%, previously on inotropes, now midodrine 5 mg TID - soft-ish BP, can increase midodrine if continues to be on  the lower side  3.  CAD - LHC 05/09/21, 3V disease - not thought to be a candidate for surgery per CVTS  4.  Late-presenting MI - trops peaked > 20,000  5.  Afib: - s/p DCCV 05/13/21 - on Eliquis and PO amiodarone  6.  MR  7.  Hyponatremia - improving and mild  8.  Dispo: inpatient  Madelon Lips 05/14/2021, 2:53 PM

## 2021-05-14 NOTE — Plan of Care (Signed)

## 2021-05-14 NOTE — Progress Notes (Addendum)
Patient ID: Ruben [name], male   DOB: 04-13-1946, 76 y.o.   MRN: [id number]     Advanced Heart Failure Rounding Note  PCP-Cardiologist: Sanda Klein, MD   Subjective:   12/26: Admit with late presentation MI. New AF 12/28: CGS initial co-ox 47%. Started NE + mil. 01/03: SR 05/08/20 Back in A fib and milrinone was cut back to 0.125 mcg.  05/09/20 Milrinone increased back to 0.25. Volume overload, elevated filling pressures on RHC with preserved CO on milrinone. Hypertensive. Back in ICU on nitro gtt.  LHC 1/5: occluded RCA (culprit), diffuse disease in LAD and Lcx. Seen by CVTS. Not felt to be a candidate for surgery. 1/9: S/P TEE followed by DC/CV -->SR. Remains on amio drip.   Co-ox 55% off milrinone.   Off diuretics.   Scr 2.0>2.2 > 2.6 > 3.1 > 4.00> 5.3   Denies SOB. Denies dizziness.    Objective:   Weight Range: 83.2 kg Body mass index is 25.58 kg/m.   Vital Signs:   Temp:  [97.9 F (36.6 C)-98.3 F (36.8 C)] 97.9 F (36.6 C) (01/10 0400) Pulse Rate:  [68-86] 68 (01/10 0400) Resp:  [15-24] 19 (01/10 0626) BP: (95-126)/(54-66) 101/55 (01/10 0400) SpO2:  [94 %-97 %] 95 % (01/10 0400) Weight:  [77.8 kg-83.2 kg] 83.2 kg (01/10 0626) Last BM Date: 05/13/21  Weight change: Filed Weights   05/13/21 0401 05/13/21 1223 05/14/21 0626  Weight: 77.8 kg 77.8 kg 83.2 kg    Intake/Output:   Intake/Output Summary (Last 24 hours) at 05/14/2021 0746 Last data filed at 05/14/2021 9371 Gross per 24 hour  Intake 455.09 ml  Output 545 ml  Net -89.91 ml      Physical Exam    CVP 3-4  General:  Sitting on the side of the bed.  No resp difficulty HEENT: normal Neck: supple. no JVD. Carotids 2+ bilat; no bruits. No lymphadenopathy or thryomegaly appreciated. Cor: PMI nondisplaced. Regular rate & rhythm. No rubs, gallops or murmurs. Lungs: clear Abdomen: soft, nontender, nondistended. No hepatosplenomegaly. No bruits or masses. Good bowel sounds. Extremities: no  cyanosis, clubbing, rash, edema. RUE PICC Neuro: alert & orientedx3, cranial nerves grossly intact. moves all 4 extremities w/o difficulty. Affect pleasant   Telemetry   SR 60-70s personally reviewed.    Labs    CBC Recent Labs    05/13/21 0355 05/14/21 0455  WBC 7.1 6.7  HGB 12.9* 12.2*  HCT 36.8* 34.7*  MCV 92.7 92.8  PLT 177 696   Basic Metabolic Panel Recent Labs    05/12/21 0421 05/13/21 0355 05/14/21 0455  NA 130* 130* 132*  K 4.0 3.9 4.1  CL 92* 94* 96*  CO2 26 25 25   GLUCOSE 206* 99 107*  BUN 28* 34* 39*  CREATININE 3.08* 4.00* 5.27*  CALCIUM 8.8* 8.6* 8.4*  MG 1.8  --   --    Liver Function Tests No results for input(s): AST, ALT, ALKPHOS, BILITOT, PROT, ALBUMIN in the last 72 hours.  No results for input(s): LIPASE, AMYLASE in the last 72 hours. Cardiac Enzymes No results for input(s): CKTOTAL, CKMB, CKMBINDEX, TROPONINI in the last 72 hours.  BNP: BNP (last 3 results) Recent Labs    04/29/21 1251  BNP 1,659.6*    ProBNP (last 3 results) No results for input(s): PROBNP in the last 8760 hours.   D-Dimer No results for input(s): DDIMER in the last 72 hours. Hemoglobin A1C No results for input(s): HGBA1C in the last 72 hours. Fasting Lipid  Panel No results for input(s): CHOL, HDL, LDLCALC, TRIG, CHOLHDL, LDLDIRECT in the last 72 hours. Thyroid Function Tests No results for input(s): TSH, T4TOTAL, T3FREE, THYROIDAB in the last 72 hours.  Invalid input(s): FREET3  Other results:   Imaging    No results found.   Medications:     Scheduled Medications:  apixaban  5 mg Oral BID   atorvastatin  80 mg Oral Daily   Chlorhexidine Gluconate Cloth  6 each Topical Daily   folic acid  1 mg Oral Daily   insulin aspart  0-5 Units Subcutaneous QHS   insulin aspart  0-9 Units Subcutaneous TID WC   mouth rinse  15 mL Mouth Rinse BID   midodrine  5 mg Oral TID WC   multivitamin with minerals  1 tablet Oral Daily   senna-docusate  1 tablet  Oral BID   sodium chloride flush  10-40 mL Intracatheter Q12H   sodium chloride flush  3 mL Intravenous Q12H   sodium chloride flush  3 mL Intravenous Q12H   thiamine  100 mg Oral Daily    Infusions:  sodium chloride     sodium chloride     amiodarone 30 mg/hr (05/14/21 0020)    PRN Medications: sodium chloride, sodium chloride, acetaminophen, ALPRAZolam, ondansetron (ZOFRAN) IV, pentafluoroprop-tetrafluoroeth, polyethylene glycol, sodium chloride flush, sodium chloride flush, sodium chloride flush    Assessment/Plan   1. CAD: Suspect delayed presentation LCx (versus RCA) MI.  Echo with EF 35-40%, inferolateral and basal inferior akinesis, moderate-severe eccentric likely infarct-related MR, normal RV.  He was not cathed initially due to AKI and delayed presentation. No s/s angina - LHC with occluded RCA (culprit), diffuse moderate disease in LAD and Lcx. No good PCI targets. Not a candidate for CABG. - No chest pain. - Continue statin - Continue aspirin 2. Acute systolic CHF/cardiogenic shock: Ischemic cardiomyopathy with infarct-related MR. Echo with EF 35-40%, inferolateral and basal inferior akinesis, moderate-severe eccentric likely infarct-related MR, normal RV.  Echo does not look bad enough to explain low output HF, concerned that MR may be playing a role.  TEE on 01/09 with EF 35-40%, moderate MR (suspect infarct-related), aortic dilatation measuring 44 mm, moderate AI.  - RHC on 0.25 mil: RA mean 16, PA 66/26 (41), PCWP 27, PA 62%, Fick CO 5.04/ CI 2.56  - Co-ox 55% off milrinone.  - CVP 3-4. Hold diuretics. Give 250 cc NS bolus. - GDMT limited by AKI - No beta blocker or other GDMT for now with recent shock and AKI 3. Hyponatremia: 129->131 -> 130->132  restrict FW - Monitor 4. Valvular heart disease: - MR moderate in severity on TEE. Likely infarct related. Moderate AI. EF 35-40% 5. Atrial fibrillation: First noted this admission.   - S/P TEE followed DC-CV--> NSR -  Stop amio drip. Start amio 200 mg twice a day.  - On eliquis 5 mg twice a day.  - WIll ask pharmacy /TOC to check cost.  6. AKI on CKD 3: Baseline creatinine around 1.4 in 6/22. Peak at 3.09 and trended down, now 2.2 -> 2.6 -> 3.1 > 4.0. > 5.3 ATN vs CIN post LHC - BP stable on midodrine.  - Consult nephrology - Check renal ultrasound.  7. Active smoker: Needs to quit.  8. ETOH abuse: Needs to cut back.  9. Thrombocytopenia: Mild, pre-dates heparin initiation. Resolved. 10. Hyperglycemia: - Continue SSI  - A1c 5.7   Length of Stay: [place], NP  05/14/2021, 7:46 AM  Advanced Heart Failure Team Pager [phone] (M-F; 7a - 5p)  Please contact [facility] for night-coverage after hours (5p -7a ) and weekends on [email or web address]  Patient seen with NP, agree with the above note.   Cardioverted to NSR yesterday, remains on amiodarone gtt.  Creatinine higher again at 5.27, UOP 545 cc.  Co-ox 56% off milrinone.  SBP > 100 with midodrine 5 tid. CVP 3-4.    No complaints today.    General: NAD Neck: No JVD, no thyromegaly or thyroid nodule.  Lungs: Clear to auscultation bilaterally with normal respiratory effort. CV: Nondisplaced PMI.  Heart regular S1/S2, no S3/S4, 2/6 HSM apex.  No peripheral edema.   Abdomen: Soft, nontender, no hepatosplenomegaly, no distention.  Skin: Intact without lesions or rashes.  Neurologic: Alert and oriented x 3.  Psych: Normal affect. Extremities: No clubbing or cyanosis.  HEENT: Normal.   CVP 3-4 today with creatinine higher.  Possible ATN progression versus CIN from cath contrast (only about 35 cc).   - Continue low dose midodrine for now to keep BP up.  - Will give fluid bolus as above with CVP low. Encourage po hydration.  - Renal ultrasound.  - Will ask nephrology to see.    He remains in NSR today after DCCV.  Can transition from IV amiodarone to po.  Continue apixaban.   Loralie Champagne 05/14/2021 8:38 AM

## 2021-05-14 NOTE — TOC CM/SW Note (Signed)
Sent benefits check for Eliquis. [place], Heart Failure TOC CM [phone]

## 2021-05-14 NOTE — Care Management Important Message (Signed)
Important Message  Patient Details  Name: Ruben [name] MRN: [id number] Date of Birth: [birth year]   Medicare Important Message Given:  Yes     [name] 05/14/2021, 3:08 PM

## 2021-05-15 ENCOUNTER — Inpatient Hospital Stay (HOSPITAL_COMMUNITY): Payer: Medicare Other

## 2021-05-15 DIAGNOSIS — N179 Acute kidney failure, unspecified: Secondary | ICD-10-CM

## 2021-05-15 DIAGNOSIS — I214 Non-ST elevation (NSTEMI) myocardial infarction: Secondary | ICD-10-CM | POA: Diagnosis not present

## 2021-05-15 DIAGNOSIS — Z452 Encounter for adjustment and management of vascular access device: Secondary | ICD-10-CM

## 2021-05-15 LAB — GLUCOSE, CAPILLARY
Glucose-Capillary: 85 mg/dL (ref 70–99)
Glucose-Capillary: 91 mg/dL (ref 70–99)
Glucose-Capillary: 136 mg/dL — ABNORMAL HIGH (ref 70–99)

## 2021-05-15 LAB — BASIC METABOLIC PANEL
Anion gap: 13 (ref 5–15)
BUN: 43 mg/dL — ABNORMAL HIGH (ref 8–23)
CO2: 21 mmol/L — ABNORMAL LOW (ref 22–32)
Calcium: 8.6 mg/dL — ABNORMAL LOW (ref 8.9–10.3)
Chloride: 94 mmol/L — ABNORMAL LOW (ref 98–111)
Creatinine, Ser: 6.58 mg/dL — ABNORMAL HIGH (ref 0.61–1.24)
GFR, Estimated: 8 mL/min — ABNORMAL LOW (ref >60)
Glucose, Bld: 92 mg/dL (ref 70–99)
Potassium: 4.1 mmol/L (ref 3.5–5.1)
Sodium: 128 mmol/L — ABNORMAL LOW (ref 135–145)

## 2021-05-15 LAB — COOXEMETRY PANEL
Carboxyhemoglobin: 0.9 % (ref 0.5–1.5)
Methemoglobin: 0.8 % (ref 0.0–1.5)
O2 Saturation: 56.6 %
Total hemoglobin: 14.6 g/dL (ref 12.0–16.0)

## 2021-05-15 LAB — CBC
HCT: 36.3 % — ABNORMAL LOW (ref 39.0–52.0)
Hemoglobin: 12.5 g/dL — ABNORMAL LOW (ref 13.0–17.0)
MCH: 32.1 pg (ref 26.0–34.0)
MCHC: 34.4 g/dL (ref 30.0–36.0)
MCV: 93.3 fL (ref 80.0–100.0)
Platelets: 173 10*3/uL (ref 150–400)
RBC: 3.89 MIL/uL — ABNORMAL LOW (ref 4.22–5.81)
RDW: 13.6 % (ref 11.5–15.5)
WBC: 6.6 10*3/uL (ref 4.0–10.5)
nRBC: 0 % (ref 0.0–0.2)

## 2021-05-15 LAB — RENAL FUNCTION PANEL
Albumin: 3 g/dL — ABNORMAL LOW (ref 3.5–5.0)
Anion gap: 12 (ref 5–15)
BUN: 49 mg/dL — ABNORMAL HIGH (ref 8–23)
CO2: 22 mmol/L (ref 22–32)
Calcium: 8.5 mg/dL — ABNORMAL LOW (ref 8.9–10.3)
Chloride: 92 mmol/L — ABNORMAL LOW (ref 98–111)
Creatinine, Ser: 7.26 mg/dL — ABNORMAL HIGH (ref 0.61–1.24)
GFR, Estimated: 7 mL/min — ABNORMAL LOW (ref >60)
Glucose, Bld: 102 mg/dL — ABNORMAL HIGH (ref 70–99)
Phosphorus: 6.1 mg/dL — ABNORMAL HIGH (ref 2.5–4.6)
Potassium: 4.2 mmol/L (ref 3.5–5.1)
Sodium: 126 mmol/L — ABNORMAL LOW (ref 135–145)

## 2021-05-15 MED ORDER — PRISMASOL BGK 4/2.5 32-4-2.5 MEQ/L REPLACEMENT SOLN: Status: DC

## 2021-05-15 MED ORDER — HEPARIN (PORCINE) 2000 UNITS/L FOR CRRT: INTRAVENOUS_CENTRAL | Status: DC | PRN

## 2021-05-15 MED ORDER — PRISMASOL BGK 4/2.5 32-4-2.5 MEQ/L EC SOLN: Status: DC

## 2021-05-15 MED ORDER — HEPARIN SODIUM (PORCINE) 1000 UNIT/ML DIALYSIS
1000.0000 [IU] | INTRAMUSCULAR | Status: DC | PRN
  Administered 2021-05-16: 2800 [IU] via INTRAVENOUS_CENTRAL
  Administered 2021-05-17: 6000 [IU] via INTRAVENOUS_CENTRAL
  Filled 2021-05-15 (×3): qty 6
  Filled 2021-05-15: qty 3
  Filled 2021-05-15: qty 6

## 2021-05-15 MED ORDER — APIXABAN 5 MG PO TABS
5.0000 mg | ORAL_TABLET | Freq: Two times a day (BID) | ORAL | Status: DC
  Administered 2021-05-15: 5 mg via ORAL
  Filled 2021-05-15: qty 1

## 2021-05-15 MED ORDER — AMIODARONE HCL IN DEXTROSE 360-4.14 MG/200ML-% IV SOLN
30.0000 mg/h | INTRAVENOUS | Status: DC
  Administered 2021-05-15 – 2021-05-19 (×10): 30 mg/h via INTRAVENOUS
  Filled 2021-05-15 (×10): qty 200

## 2021-05-15 MED ORDER — HEPARIN (PORCINE) 25000 UT/250ML-% IV SOLN: 1150.0000 [IU]/h | INTRAVENOUS | Status: DC

## 2021-05-15 MED ORDER — BENZONATATE 100 MG PO CAPS
100.0000 mg | ORAL_CAPSULE | Freq: Three times a day (TID) | ORAL | Status: DC | PRN
  Administered 2021-05-15 – 2021-05-16 (×3): 100 mg via ORAL
  Filled 2021-05-15 (×3): qty 1

## 2021-05-15 MED ORDER — HEPARIN SODIUM (PORCINE) 1000 UNIT/ML IJ SOLN
3000.0000 [IU] | Freq: Once | INTRAMUSCULAR | Status: AC
  Administered 2021-05-15: 3000 [IU] via INTRAVENOUS
  Filled 2021-05-15: qty 3

## 2021-05-15 NOTE — Progress Notes (Signed)
Bladder scan results is 61ml

## 2021-05-15 NOTE — Progress Notes (Addendum)
Patient ID: Rub[name], male   DOB: 1945-06-03, 76 y.o.   MRN: [id number]     Advanced Heart Failure Rounding Note  PCP-Cardiologist: Sanda Klein, MD   Subjective:   12/26: Admit with late presentation MI. New AF 12/28: CGS initial co-ox 47%. Started NE + mil. 01/03: SR 05/08/20 Back in A fib and milrinone was cut back to 0.125 mcg.  05/09/20 Milrinone increased back to 0.25. Volume overload, elevated filling pressures on RHC with preserved CO on milrinone. Hypertensive. Back in ICU on nitro gtt.  LHC 1/5: occluded RCA (culprit), diffuse disease in LAD and Lcx. Seen by CVTS. Not felt to be a candidate for surgery. 1/9: S/P TEE followed by DC/CV -->SR.   Co-ox 57% off milrinone. CVP 12  Scr 2.0 > 2.2 > 2.6 > 3.1 > 4.00 > 5.3 > 6.58. BUN 43  Made very small amount of urine with bowel movement this am, otherwise no UOP since yesterday evening.  SBP > 100 this am  Back in AF this am, rate 60s-70s  Doesn't feel great today. Decreased appetite. One episode of emesis after choked on pill. No dyspnea or CP.   Objective:   Weight Range: 78.7 kg Body mass index is 24.2 kg/m.   Vital Signs:   Temp:  [97.6 F (36.4 C)-98.4 F (36.9 C)] 98 F (36.7 C) (01/11 0759) Pulse Rate:  [68-73] 70 (01/11 0759) Resp:  [16-26] 21 (01/11 0759) BP: (98-117)/(56-75) 117/62 (01/11 0759) SpO2:  [92 %-96 %] 93 % (01/11 0759) Weight:  [78.7 kg] 78.7 kg (01/11 0622) Last BM Date: 05/14/21  Weight change: Filed Weights   05/13/21 1223 05/14/21 0626 05/15/21 0622  Weight: 77.8 kg 83.2 kg 78.7 kg    Intake/Output:   Intake/Output Summary (Last 24 hours) at 05/15/2021 0829 Last data filed at 05/15/2021 0800 Gross per 24 hour  Intake 840 ml  Output 200 ml  Net 640 ml      Physical Exam    CVP 12 General:  Sitting up on side of bed. No distress.  HEENT: normal Neck: supple. JVP 12 cm. Carotids 2+ bilat; no bruits.  Cor: PMI nondisplaced. Regular rate & rhythm. No rubs, gallops  or murmurs. Lungs: clear Abdomen: soft, nontender, nondistended. No hepatosplenomegaly. No bruits or masses. Good bowel sounds. Extremities: no cyanosis, clubbing, rash, edema Neuro: alert & orientedx3, cranial nerves grossly intact. moves all 4 extremities w/o difficulty. Affect pleasant    Telemetry   AF 60s-70s (personally reviewed)   Labs    CBC Recent Labs    05/14/21 0455 05/15/21 0354  WBC 6.7 6.6  HGB 12.2* 12.5*  HCT 34.7* 36.3*  MCV 92.8 93.3  PLT 172 326   Basic Metabolic Panel Recent Labs    05/14/21 0455 05/15/21 0354  NA 132* 128*  K 4.1 4.1  CL 96* 94*  CO2 25 21*  GLUCOSE 107* 92  BUN 39* 43*  CREATININE 5.27* 6.58*  CALCIUM 8.4* 8.6*   Liver Function Tests No results for input(s): AST, ALT, ALKPHOS, BILITOT, PROT, ALBUMIN in the last 72 hours.  No results for input(s): LIPASE, AMYLASE in the last 72 hours. Cardiac Enzymes No results for input(s): CKTOTAL, CKMB, CKMBINDEX, TROPONINI in the last 72 hours.  BNP: BNP (last 3 results) Recent Labs    04/29/21 1251  BNP 1,659.6*    ProBNP (last 3 results) No results for input(s): PROBNP in the last 8760 hours.   D-Dimer No results for input(s): DDIMER in the last  72 hours. Hemoglobin A1C No results for input(s): HGBA1C in the last 72 hours. Fasting Lipid Panel No results for input(s): CHOL, HDL, LDLCALC, TRIG, CHOLHDL, LDLDIRECT in the last 72 hours. Thyroid Function Tests No results for input(s): TSH, T4TOTAL, T3FREE, THYROIDAB in the last 72 hours.  Invalid input(s): FREET3  Other results:   Imaging    US RENAL  Result Date: 05/14/2021 CLINICAL DATA:  Acute kidney injury. EXAM: RENAL / URINARY TRACT ULTRASOUND COMPLETE COMPARISON:  None. FINDINGS: Right Kidney: Renal measurements: 11.6 x 5.7 x 5.7 cm = volume: 199 mL. Increased echogenicity of renal parenchyma is noted. 6 mm nonobstructive calculus is seen in upper pole. Two simple cysts are noted, the largest measuring 2.1 cm.  No mass or hydronephrosis visualized. Left Kidney: Renal measurements: 12.0 x 6.3 x 6.3 cm = volume: 247 mL. Increased echogenicity is noted suggesting medical renal disease. 2 simple cysts are noted, the largest measuring 1.9 cm. No mass or hydronephrosis visualized. Bladder: Appears normal for degree of bladder distention. Other: Moderately enlarged prostate gland is noted. IMPRESSION: Increased echogenicity of renal parenchyma is noted bilaterally consistent with medical renal disease. Bilateral simple renal cysts are noted. No hydronephrosis or renal obstruction is noted. Nonobstructive right renal calculus is noted. Moderate prostatic enlargement is noted. Electronically Signed   By: Marijo Conception M.D.   On: 05/14/2021 09:16     Medications:     Scheduled Medications:  amiodarone  200 mg Oral BID   apixaban  5 mg Oral BID   atorvastatin  80 mg Oral Daily   Chlorhexidine Gluconate Cloth  6 each Topical Daily   folic acid  1 mg Oral Daily   mouth rinse  15 mL Mouth Rinse BID   midodrine  5 mg Oral TID WC   multivitamin with minerals  1 tablet Oral Daily   senna-docusate  1 tablet Oral BID   sodium chloride flush  10-40 mL Intracatheter Q12H   sodium chloride flush  3 mL Intravenous Q12H   sodium chloride flush  3 mL Intravenous Q12H   thiamine  100 mg Oral Daily    Infusions:  sodium chloride     sodium chloride      PRN Medications: sodium chloride, sodium chloride, acetaminophen, ALPRAZolam, ondansetron (ZOFRAN) IV, pentafluoroprop-tetrafluoroeth, polyethylene glycol, sodium chloride flush, sodium chloride flush, sodium chloride flush    Assessment/Plan   1. CAD: Suspect delayed presentation LCx (versus RCA) MI.  Echo with EF 35-40%, inferolateral and basal inferior akinesis, moderate-severe eccentric likely infarct-related MR, normal RV.  He was not cathed initially due to AKI and delayed presentation. No s/s angina - LHC with occluded RCA (culprit), diffuse moderate  disease in LAD and Lcx. No good PCI targets. Not a candidate for CABG. - No chest pain. - Continue statin - Continue aspirin 2. Acute systolic CHF/cardiogenic shock: Ischemic cardiomyopathy with infarct-related MR. Echo with EF 35-40%, inferolateral and basal inferior akinesis, moderate-severe eccentric likely infarct-related MR, normal RV.  Echo does not look bad enough to explain low output HF, concerned that MR may be playing a role.  TEE on 01/09 with EF 35-40%, moderate MR (suspect infarct-related), aortic dilatation measuring 44 mm, moderate AI.  - RHC on 0.25 mil: RA mean 16, PA 66/26 (41), PCWP 27, PA 62%, Fick CO 5.04/ CI 2.56  - Co-ox 57% off milrinone - CVP 13 today. Has been off diuretics. Scr worsening and not making much urine. See below - GDMT limited by AKI - No beta  blocker or other GDMT for now with recent shock and AKI 3. Hyponatremia: 129 >131 > 130 >132  > 128  - restrict FW - Monitor 4. Valvular heart disease: - MR moderate in severity on TEE. Likely infarct related. Moderate AI. EF 35-40% 5. Atrial fibrillation: First noted this admission.   - S/P TEE followed DC-CV--> NSR - Back in AF today. Controlled ventricular rate. - Continue po amio 200 mg twice a day.  - On eliquis 5 mg twice a day.  - WIll ask pharmacy /TOC to check cost.  6. AKI on CKD 3: Baseline creatinine around 1.4 in 6/22. Peak at 3.09 and trended down, now 2.2 -> 2.6 -> 3.1 > 4.0. > 5.3 > 6.58. BUN in 56s - Nephrology consulted. 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 Received 250 cc bolus NS yesterday. - Little UOP last 24 hrs. Feels worse today. Discussed with Dr. Aundra Dubin. Will transfer to ICU and discuss placement of temp HD catheter with CCM. Suspect may need RRT in near future. - SBP > 100 on midodrine 5 TID - Renal US with no obstruction 7. Active smoker: Needs to quit.  8. ETOH abuse: Needs to cut back.  9. Thrombocytopenia: Mild, pre-dates heparin initiation. Resolved. 10. Hyperglycemia: - Continue SSI  - A1c  5.7   Length of Stay: [place], [place], PA-C  05/15/2021, 8:29 AM  Advanced Heart Failure Team Pager [phone] (M-F; 7a - 5p)  Please contact [facility] for night-coverage after hours (5p -7a ) and weekends on [email or web address]  Patient seen with PA, agree with the above note.   Creatinine continues to rise, up to 6.58 this morning.  BUN mildly higher at 43.  Co-ox 57% off inotropres.  MAP stable on midodrine 5 tid.  He went back into atrial fibrillation overnight, rate is controlled. CVP 7 on my read.   Nausea this morning.   General: NAD Neck: No JVD, no thyromegaly or thyroid nodule.  Lungs: Clear to auscultation bilaterally with normal respiratory effort. CV: Nondisplaced PMI.  Heart irregular S1/S2, no S3/S4, 2/6 HSM apex.  No peripheral edema.   Abdomen: Soft, nontender, no hepatosplenomegaly, no distention.  Skin: Intact without lesions or rashes.  Neurologic: Alert and oriented x 3.  Psych: Normal affect. Extremities: No clubbing or cyanosis.  HEENT: Normal.   He went back into atrial fibrillation.  Continue apixaban and restart amiodarone gtt 30 mg/hr.  If he does not convert on his own over time, would repeat DCCV.   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 BP stable and cardiac output appears adequate by co-ox.  Though BUN is not markedly high, he is nauseated.  CVP 7 so not markedly volume overloaded.  200 cc UOP measured.  - I will move him back to CCU and place HD catheter.  I am concerned that he may end up needing CVVH.  Nephrology following.   Loralie Champagne 05/15/2021 9:20 AM

## 2021-05-15 NOTE — Procedures (Signed)
Central Venous Catheter Insertion Procedure Note  Noriel Guthrie  [id number]  [birth year]  Date:05/15/21  Time:2:28 PM   Provider Performing:Pete Johnette Abraham Kary Kos   Procedure: Insertion of Non-tunneled Central Venous Catheter(36556)with US guidance ([id number])    Indication(s) Hemodialysis  Consent Risks of the procedure as well as the alternatives and risks of each were explained to the patient and/or caregiver.  Consent for the procedure was obtained and is signed in the bedside chart  Anesthesia Topical only with 1% lidocaine   Timeout Verified patient identification, verified procedure, site/side was marked, verified correct patient position, special equipment/implants available, medications/allergies/relevant history reviewed, required imaging and test results available.  Sterile Technique Maximal sterile technique including full sterile barrier drape, hand hygiene, sterile gown, sterile gloves, mask, hair covering, sterile ultrasound probe cover (if used).  Procedure Description Area of catheter insertion was cleaned with chlorhexidine and draped in sterile fashion.   With real-time ultrasound guidance a HD catheter was placed into the left internal jugular vein.  Nonpulsatile blood flow and easy flushing noted in all ports.  The catheter was sutured in place and sterile dressing applied.  Complications/Tolerance None; patient tolerated the procedure well. Chest X-ray is ordered to verify placement for internal jugular or subclavian cannulation.  Chest x-ray is not ordered for femoral cannulation.  EBL Minimal  Specimen(s) None  Erick Colace ACNP-BC [facility] Pager # [phone] OR # [phone] if no answer

## 2021-05-15 NOTE — Progress Notes (Signed)
[name] Progress Note    Assessment/ Plan:    AKI on CKD 3b:normally sees Dr Posey Pronto.  On ARB as OP.  He has had both significant hyper- and hypotensive episodes during this hospitalization requiring treatment and a contrast load on 05/09/21.  Renal US without obstruction and R 6 mm renal calculus.  UA on admission bland. - repeat UA bland again 05/14/21 - I suspect ATN + CIN combination--> given flipping back into Afib overnight, ? Cardiorenal syndrome (would be less likely with his EF) - no increased UOP with bolus given yesterday, CVP 7 today - no hard indication for RRT at present but will make a day-to-day determination--> I suspect he will need it in the next 24-48 hrs if UOP/ Cr do not improve - discussed with pt and dtr Reba at bedside- pt willing to undergo dialysis if needed - agree with moving to ICU and placing HD catheter, appreciate AHF and PCCM   2.  Acute on chronic systolic CHF: EF 35-70%, previously on inotropes, now midodrine 5 mg TID - soft-ish BP, can increase midodrine if continues to be on the lower side - ? If trial of inotropes would be beneficial, will defer to AHF   3.  CAD - LHC 05/09/21, 3V disease - not thought to be a candidate for surgery per CVTS   4.  Late-presenting MI - trops peaked > 20,000   5.  Afib: - s/p DCCV 05/13/21 - on Eliquis and PO amiodarone   6.  MR   7.  Hyponatremia - improving and mild   8.  Dispo: inpatient  Subjective:    No UOP overnight, 200 mL for the whole day.  Nauseated, very anxious this AM.  CVP 7.  Rate of rise of Cr very steep, Cr 6.6 this AM.  Back into Afib overnight too.     Objective:   BP 117/62 (BP Location: Left Arm)    Pulse 70    Temp 98 F (36.7 C) (Oral)    Resp (!) 21    Ht 5\' 11"  (1.803 m)    Wt 78.7 kg    SpO2 93%    BMI 24.20 kg/m   Intake/Output Summary (Last 24 hours) at 05/15/2021 1007 Last data filed at 05/15/2021 0800 Gross per 24 hour  Intake 840 ml  Output 200 ml  Net 640  ml   Weight change: 0.9 kg  Physical Exam: GEN sitting up on side of bed, appears anxious HEENT no jaundice NECK no overt JVD PULM some faint bibasilar crackles that resolves with brathing CV some ectopy today ABD soft EXT no LE edema, slightly cool legs   Imaging: US RENAL  Result Date: 05/14/2021 CLINICAL DATA:  Acute kidney injury. EXAM: RENAL / URINARY TRACT ULTRASOUND COMPLETE COMPARISON:  None. FINDINGS: Right Kidney: Renal measurements: 11.6 x 5.7 x 5.7 cm = volume: 199 mL. Increased echogenicity of renal parenchyma is noted. 6 mm nonobstructive calculus is seen in upper pole. Two simple cysts are noted, the largest measuring 2.1 cm. No mass or hydronephrosis visualized. Left Kidney: Renal measurements: 12.0 x 6.3 x 6.3 cm = volume: 247 mL. Increased echogenicity is noted suggesting medical renal disease. 2 simple cysts are noted, the largest measuring 1.9 cm. No mass or hydronephrosis visualized. Bladder: Appears normal for degree of bladder distention. Other: Moderately enlarged prostate gland is noted. IMPRESSION: Increased echogenicity of renal parenchyma is noted bilaterally consistent with medical renal disease. Bilateral simple renal cysts are noted. No hydronephrosis  or renal obstruction is noted. Nonobstructive right renal calculus is noted. Moderate prostatic enlargement is noted. Electronically Signed   By: Marijo Conception M.D.   On: 05/14/2021 09:16    Labs: BMET Recent Labs  Lab 05/09/21 0425 05/09/21 1340 05/10/21 0455 05/11/21 0500 05/12/21 0421 05/13/21 0355 05/14/21 0455 05/15/21 0354  NA 129* 135   135 129* 131* 130* 130* 132* 128*  K 3.6 4.4   4.3 4.0 4.0 4.0 3.9 4.1 4.1  CL 93*  --  93* 98 92* 94* 96* 94*  CO2 22  --  24 25 26 25 25  21*  GLUCOSE 415*  --  297* 160* 206* 99 107* 92  BUN 22  --  23 26* 28* 34* 39* 43*  CREATININE 2.00*  --  2.20* 2.60* 3.08* 4.00* 5.27* 6.58*  CALCIUM 8.5*  --  8.5* 8.6* 8.8* 8.6* 8.4* 8.6*   CBC Recent Labs  Lab  05/12/21 0421 05/13/21 0355 05/14/21 0455 05/15/21 0354  WBC 6.6 7.1 6.7 6.6  HGB 12.7* 12.9* 12.2* 12.5*  HCT 36.9* 36.8* 34.7* 36.3*  MCV 93.9 92.7 92.8 93.3  PLT 167 177 172 173    Medications:     apixaban  5 mg Oral BID   atorvastatin  80 mg Oral Daily   Chlorhexidine Gluconate Cloth  6 each Topical Daily   folic acid  1 mg Oral Daily   mouth rinse  15 mL Mouth Rinse BID   midodrine  5 mg Oral TID WC   multivitamin with minerals  1 tablet Oral Daily   senna-docusate  1 tablet Oral BID   sodium chloride flush  10-40 mL Intracatheter Q12H   sodium chloride flush  3 mL Intravenous Q12H   sodium chloride flush  3 mL Intravenous Q12H   thiamine  100 mg Oral Daily      Madelon Lips MD 05/15/2021, 10:07 AM

## 2021-05-16 DIAGNOSIS — I4819 Other persistent atrial fibrillation: Secondary | ICD-10-CM

## 2021-05-16 DIAGNOSIS — N179 Acute kidney failure, unspecified: Secondary | ICD-10-CM | POA: Diagnosis not present

## 2021-05-16 LAB — RENAL FUNCTION PANEL
Albumin: 3 g/dL — ABNORMAL LOW (ref 3.5–5.0)
Albumin: 3.2 g/dL — ABNORMAL LOW (ref 3.5–5.0)
Anion gap: 13 (ref 5–15)
Anion gap: 10 (ref 5–15)
BUN: 28 mg/dL — ABNORMAL HIGH (ref 8–23)
BUN: 19 mg/dL (ref 8–23)
CO2: 22 mmol/L (ref 22–32)
CO2: 25 mmol/L (ref 22–32)
Calcium: 8.1 mg/dL — ABNORMAL LOW (ref 8.9–10.3)
Calcium: 8 mg/dL — ABNORMAL LOW (ref 8.9–10.3)
Chloride: 96 mmol/L — ABNORMAL LOW (ref 98–111)
Chloride: 99 mmol/L (ref 98–111)
Creatinine, Ser: 4.32 mg/dL — ABNORMAL HIGH (ref 0.61–1.24)
Creatinine, Ser: 3.33 mg/dL — ABNORMAL HIGH (ref 0.61–1.24)
GFR, Estimated: 14 mL/min — ABNORMAL LOW (ref >60)
GFR, Estimated: 19 mL/min — ABNORMAL LOW (ref >60)
Glucose, Bld: 137 mg/dL — ABNORMAL HIGH (ref 70–99)
Glucose, Bld: 138 mg/dL — ABNORMAL HIGH (ref 70–99)
Phosphorus: 3.5 mg/dL (ref 2.5–4.6)
Phosphorus: 2.2 mg/dL — ABNORMAL LOW (ref 2.5–4.6)
Potassium: 4 mmol/L (ref 3.5–5.1)
Potassium: 4.3 mmol/L (ref 3.5–5.1)
Sodium: 131 mmol/L — ABNORMAL LOW (ref 135–145)
Sodium: 134 mmol/L — ABNORMAL LOW (ref 135–145)

## 2021-05-16 LAB — CBC
HCT: 37.2 % — ABNORMAL LOW (ref 39.0–52.0)
Hemoglobin: 12.8 g/dL — ABNORMAL LOW (ref 13.0–17.0)
MCH: 32.2 pg (ref 26.0–34.0)
MCHC: 34.4 g/dL (ref 30.0–36.0)
MCV: 93.5 fL (ref 80.0–100.0)
Platelets: 147 10*3/uL — ABNORMAL LOW (ref 150–400)
RBC: 3.98 MIL/uL — ABNORMAL LOW (ref 4.22–5.81)
RDW: 13.3 % (ref 11.5–15.5)
WBC: 5.5 10*3/uL (ref 4.0–10.5)
nRBC: 0 % (ref 0.0–0.2)

## 2021-05-16 LAB — COOXEMETRY PANEL
Carboxyhemoglobin: 0.7 % (ref 0.5–1.5)
Carboxyhemoglobin: 1 % (ref 0.5–1.5)
Methemoglobin: 0.9 % (ref 0.0–1.5)
Methemoglobin: 0.7 % (ref 0.0–1.5)
O2 Saturation: 45.5 %
O2 Saturation: 49.7 %
Total hemoglobin: 12.7 g/dL (ref 12.0–16.0)
Total hemoglobin: 13.1 g/dL (ref 12.0–16.0)

## 2021-05-16 LAB — GLUCOSE, CAPILLARY
Glucose-Capillary: 127 mg/dL — ABNORMAL HIGH (ref 70–99)
Glucose-Capillary: 113 mg/dL — ABNORMAL HIGH (ref 70–99)
Glucose-Capillary: 102 mg/dL — ABNORMAL HIGH (ref 70–99)
Glucose-Capillary: 112 mg/dL — ABNORMAL HIGH (ref 70–99)

## 2021-05-16 LAB — APTT: aPTT: >200 seconds (ref 24–36)

## 2021-05-16 LAB — MAGNESIUM: Magnesium: 2.2 mg/dL (ref 1.7–2.4)

## 2021-05-16 MED ORDER — DOBUTAMINE IN D5W 4-5 MG/ML-% IV SOLN
1.0000 ug/kg/min | INTRAVENOUS | Status: DC
  Administered 2021-05-16: 2 ug/kg/min via INTRAVENOUS
  Filled 2021-05-16: qty 250

## 2021-05-16 MED ORDER — HEPARIN (PORCINE) 25000 UT/250ML-% IV SOLN
900.0000 [IU]/h | INTRAVENOUS | Status: DC
  Administered 2021-05-16: 900 [IU]/h via INTRAVENOUS

## 2021-05-16 MED ORDER — HEPARIN (PORCINE) 25000 UT/250ML-% IV SOLN
1150.0000 [IU]/h | INTRAVENOUS | Status: DC
  Administered 2021-05-16: 1150 [IU]/h via INTRAVENOUS
  Filled 2021-05-16: qty 250

## 2021-05-16 NOTE — Progress Notes (Signed)
Repeat co-ox 49%. D/w Dr. Aundra Dubin. Start DBA 2 mcg/kg/min. Repeat co-ox at noon.   Lyda Jester, PA-C

## 2021-05-16 NOTE — Progress Notes (Signed)
[facility] for Eliquis > Heparin Indication: atrial fibrillation  Allergies  Allergen Reactions   Other Other (See Comments)    Pollen - unknown    Patient Measurements: Height: 5\' 11"  (180.3 cm) Weight: 78.7 kg (173 lb 8 oz) IBW/kg (Calculated) : 75.3 Heparin Dosing Weight: 78 kg  Vital Signs: Temp: 98.9 F (37.2 C) (01/12 1500) Temp Source: Oral (01/12 1100) BP: 125/72 (01/12 1700) Pulse Rate: 89 (01/12 1700)  Labs: Recent Labs    05/14/21 0455 05/15/21 0354 05/15/21 1628 05/16/21 0407 05/16/21 1621  HGB 12.2* 12.5*  --  12.8*  --   HCT 34.7* 36.3*  --  37.2*  --   PLT 172 173  --  147*  --   APTT  --   --   --   --  >200*  CREATININE 5.27* 6.58* 7.26* 4.32* 3.33*     Estimated Creatinine Clearance: 20.4 mL/min (A) (by C-G formula based on SCr of 3.33 mg/dL (H)).   Assessment: 76 yo male on chronic Eliquis for afib, now initiated on CRRT and pharmacy asked to transition Eliquis to IV heparin.   Recent DCCV 1/9 > back into AF today.  Last dose of Eliquis given 1/11 at 2200 pm. Heparin level affected by Eliquis so utilizing aPTT for monitoring until levels correlate.  aPTT >200 sec on heparin infusion at 1150 units/hr. Heparin running in peripheral line in arm opposite where PICC is - lab drawn from PICC. No bleeding noted per RN.   Goal of Therapy:  Heparin level 0.3-0.7 units/ml; aPTT 66-102 sec Monitor platelets by anticoagulation protocol: Yes   Plan:  Hold heparin x 1 hour, restart at 900 units/hr Will f/u aPTT and heparin level in in 8 hours  Sherlon Handing, PharmD, BCPS Please see amion for complete clinical pharmacist phone list  05/16/2021 6:09 PM

## 2021-05-16 NOTE — Progress Notes (Signed)
ANTICOAGULATION CONSULT NOTE - Initial Consult  Pharmacy Consult for Eliquis > Heparin Indication: atrial fibrillation  Allergies  Allergen Reactions   Other Other (See Comments)    Pollen - unknown    Patient Measurements: Height: 5\' 11"  (180.3 cm) Weight: 78.7 kg (173 lb 8 oz) IBW/kg (Calculated) : 75.3 Heparin Dosing Weight: 78 kg  Vital Signs: Temp: 98.2 F (36.8 C) (01/12 1100) Temp Source: Oral (01/12 1100) BP: 105/96 (01/12 1300) Pulse Rate: 79 (01/12 1300)  Labs: Recent Labs    05/14/21 0455 05/15/21 0354 05/15/21 1628 05/16/21 0407  HGB 12.2* 12.5*  --  12.8*  HCT 34.7* 36.3*  --  37.2*  PLT 172 173  --  147*  CREATININE 5.27* 6.58* 7.26* 4.32*    Estimated Creatinine Clearance: 15.7 mL/min (A) (by C-G formula based on SCr of 4.32 mg/dL (H)).   Medical History: Past Medical History:  Diagnosis Date   Arthritis    Chronic kidney disease, stage 3b ([place])    ETOH abuse    History of stress test 09/02/2008   showed inferolateral scar without ischemia   Hx of echocardiogram 09/02/2008   was essentially normal   Hyperlipidemia    Hypertension    Tobacco abuse     Medications:  Infusions:    prismasol BGK 4/2.5 500 mL/hr at 05/16/21 0435    prismasol BGK 4/2.5 300 mL/hr at 05/16/21 1113   sodium chloride     sodium chloride     amiodarone 30 mg/hr (05/16/21 1200)   DOBUTamine 2 mcg/kg/min (05/16/21 1200)   heparin 1,150 Units/hr (05/16/21 1200)   prismasol BGK 4/2.5 1,500 mL/hr at 05/16/21 1109    Assessment: 76 yo male on chronic Eliquis for afib, now initiated on CRRT and pharmacy asked to transition Eliquis to IV heparin.   Recent DCCV 1/9 > back into AF today.  Last dose of Eliquis given 1/11 at 2200 pm.  CBC WNL, no overt bleeding or complications noted.  Goal of Therapy:  Heparin level 0.3-0.7 units/ml Monitor platelets by anticoagulation protocol: Yes   Plan:  Stop Eliquis. Start IV heparin 1150 units/hr. Check aPTT in 8 hrs  (heparin levels will be falsely elevated from recent Eliquis). Daily heparin level, aPTT, and CBC.  [place] Crane, Roylene Reason, BCCP Clinical Pharmacist  05/16/2021 1:14 PM   [facility] pharmacy phone numbers are listed on [email or web address]

## 2021-05-16 NOTE — Progress Notes (Signed)
[facility] Progress Note    Assessment/ Plan:    AKI on CKD 3b:normally sees Dr Posey Pronto.  On ARB as OP.  He has had both significant hyper- and hypotensive episodes during this hospitalization requiring treatment and a contrast load on 05/09/21.  Renal US without obstruction and R 6 mm renal calculus.  UA on admission bland. - repeat UA bland again 05/14/21 - I suspect ATN + CIN combination + cardiorenal sydrome - started CRRT 05/15/21 - continue UF as tolerated- net neg 100 mL/ hr   2.  Acute on chronic systolic CHF: EF 79-39%, previously on inotropes, midodrine 5 mg TID -co-ox 49% this AM - back on dobutamine @ 2 05/16/21   3.  CAD - LHC 05/09/21, 3V disease - not thought to be a candidate for surgery per CVTS   4.  Late-presenting MI - trops peaked > 20,000   5.  Afib: - s/p DCCV 05/13/21 - on Eliquis and PO amiodarone   6.  MR   7.  Hyponatremia - improving and mild   8.  Dispo: inpatient  Subjective:    Started CRRT yesterday without incident.  Co-ox down to 49% this AM, starting dobutamine.  CVP up- will increase UF goal.     Objective:   BP 111/74    Pulse 67    Temp (!) 97.5 F (36.4 C) (Oral)    Resp (!) 22    Ht 5\' 11"  (1.803 m)    Wt 78.7 kg    SpO2 95%    BMI 24.20 kg/m   Intake/Output Summary (Last 24 hours) at 05/16/2021 0949 Last data filed at 05/16/2021 0900 Gross per 24 hour  Intake 1027.34 ml  Output 1163 ml  Net -135.66 ml   Weight change: 1.2 kg  Physical Exam: GEN lying in bed, appears relatively comfortable HEENT no jaundice NECK no overt JVD PULM some faint bibasilar crackles that resolves with brathing CV RRR ABD soft EXT no LE edema, slightly cool legs, hands a little puffier this AM ACCESS: L IJ HD cath  Imaging: DG Chest Port 1 View  Result Date: 05/15/2021 CLINICAL DATA:  Central line placement. EXAM: PORTABLE CHEST 1 VIEW COMPARISON:  Chest radiograph dated May 09, 2021 FINDINGS: Interval placement of left IJ access  central line with distal tip in the SVC. Right PICC with distal tip in the distal SVC is unchanged. Stable cardiomegaly with pulmonary vascular congestion. Small bilateral pleural effusions, unchanged. The osseous structures are unremarkable. IMPRESSION: 1. Interval placement of left IJ access central line with distal tip in the SVC. 2.  No other significant interval change. Electronically Signed   By: Keane Police D.O.   On: 05/15/2021 14:40    Labs: BMET Recent Labs  Lab 05/11/21 0500 05/12/21 0421 05/13/21 0355 05/14/21 0455 05/15/21 0354 05/15/21 1628 05/16/21 0407  NA 131* 130* 130* 132* 128* 126* 131*  K 4.0 4.0 3.9 4.1 4.1 4.2 4.0  CL 98 92* 94* 96* 94* 92* 96*  CO2 25 26 25 25  21* 22 22  GLUCOSE 160* 206* 99 107* 92 102* 137*  BUN 26* 28* 34* 39* 43* 49* 28*  CREATININE 2.60* 3.08* 4.00* 5.27* 6.58* 7.26* 4.32*  CALCIUM 8.6* 8.8* 8.6* 8.4* 8.6* 8.5* 8.1*  PHOS  --   --   --   --   --  6.1* 3.5   CBC Recent Labs  Lab 05/13/21 0355 05/14/21 0455 05/15/21 0354 05/16/21 0407  WBC 7.1 6.7 6.6 5.5  HGB 12.9* 12.2* 12.5* 12.8*  HCT 36.8* 34.7* 36.3* 37.2*  MCV 92.7 92.8 93.3 93.5  PLT 177 172 173 147*    Medications:     atorvastatin  80 mg Oral Daily   Chlorhexidine Gluconate Cloth  6 each Topical Daily   folic acid  1 mg Oral Daily   mouth rinse  15 mL Mouth Rinse BID   midodrine  5 mg Oral TID WC   multivitamin with minerals  1 tablet Oral Daily   senna-docusate  1 tablet Oral BID   sodium chloride flush  10-40 mL Intracatheter Q12H   sodium chloride flush  3 mL Intravenous Q12H   sodium chloride flush  3 mL Intravenous Q12H   thiamine  100 mg Oral Daily      Madelon Lips MD 05/16/2021, 9:49 AM

## 2021-05-16 NOTE — Progress Notes (Addendum)
Patient ID: Ruben [name], male   DOB: 08-Jul-1945, 76 y.o.   MRN: [id number]     Advanced Heart Failure Rounding Note  PCP-Cardiologist: Sanda Klein, MD   Subjective:   12/26: Admit with late presentation MI. New AF 12/28: CGS initial co-ox 47%. Started NE + mil. 01/03: SR 05/08/20 Back in A fib and milrinone was cut back to 0.125 mcg.  05/09/20 Milrinone increased back to 0.25. Volume overload, elevated filling pressures on RHC with preserved CO on milrinone. Hypertensive. Back in ICU on nitro gtt.  LHC 1/5: occluded RCA (culprit), diffuse disease in LAD and Lcx. Seen by CVTS. Not felt to be a candidate for surgery. 1/9: S/P TEE followed by DC/CV -->SR.  1/11: Afib w/ RVR>>amio gtt started 1/11: Moved to ICU for CVVH   Co-ox 46% off milrinone. STAT repeat pending.   [place] started last PM, running -50/hr. CVP 11-12  Scr 2.0 > 2.2 > 2.6 > 3.1 > 4.00 > 5.3 > 6.58 >7.26 >4.32. BUN 49>>28  Oliguric, 150 cc out yesterday.   Remains in Afib, rates 70s   No complaints. Denies dyspnea. No CP. Daughter at bedside.     Objective:   Weight Range: 78.7 kg Body mass index is 24.2 kg/m.   Vital Signs:   Temp:  [97.7 F (36.5 C)-98.2 F (36.8 C)] 98 F (36.7 C) (01/12 0400) Pulse Rate:  [57-72] 65 (01/12 0700) Resp:  [12-28] 20 (01/12 0700) BP: (105-128)/(53-80) 119/66 (01/12 0700) SpO2:  [84 %-100 %] 96 % (01/12 0700) Weight:  [78.7 kg-79.9 kg] 78.7 kg (01/12 0500) Last BM Date: 05/14/21  Weight change: Filed Weights   05/15/21 0622 05/15/21 1200 05/16/21 0500  Weight: 78.7 kg 79.9 kg 78.7 kg    Intake/Output:   Intake/Output Summary (Last 24 hours) at 05/16/2021 0729 Last data filed at 05/16/2021 0700 Gross per 24 hour  Intake 1102.22 ml  Output 929 ml  Net 173.22 ml      Physical Exam    CVP 11-12  General:  Well appearing. No respiratory difficulty HEENT: normal Neck: supple. JVD 12 cm, Lt THD cath. Carotids 2+ bilat; no bruits. No lymphadenopathy or  thyromegaly appreciated. Cor: PMI nondisplaced. Irregularly irregular rhythm. No rubs, gallops or murmurs. Lungs: b/w rhonchi, no crackles or wheezing  Abdomen: soft, nontender, nondistended. No hepatosplenomegaly. No bruits or masses. Good bowel sounds. Extremities: no cyanosis, clubbing, rash, edema Neuro: alert & oriented x 3, cranial nerves grossly intact. moves all 4 extremities w/o difficulty. Affect pleasant.   Telemetry   AF 70s (personally reviewed)   Labs    CBC Recent Labs    05/15/21 0354 05/16/21 0407  WBC 6.6 5.5  HGB 12.5* 12.8*  HCT 36.3* 37.2*  MCV 93.3 93.5  PLT 173 333*   Basic Metabolic Panel Recent Labs    05/15/21 1628 05/16/21 0407  NA 126* 131*  K 4.2 4.0  CL 92* 96*  CO2 22 22  GLUCOSE 102* 137*  BUN 49* 28*  CREATININE 7.26* 4.32*  CALCIUM 8.5* 8.1*  MG  --  2.2  PHOS 6.1* 3.5   Liver Function Tests Recent Labs    05/15/21 1628 05/16/21 0407  ALBUMIN 3.0* 3.0*    No results for input(s): LIPASE, AMYLASE in the last 72 hours. Cardiac Enzymes No results for input(s): CKTOTAL, CKMB, CKMBINDEX, TROPONINI in the last 72 hours.  BNP: BNP (last 3 results) Recent Labs    04/29/21 1251  BNP 1,659.6*    ProBNP (last 3  results) No results for input(s): PROBNP in the last 8760 hours.   D-Dimer No results for input(s): DDIMER in the last 72 hours. Hemoglobin A1C No results for input(s): HGBA1C in the last 72 hours. Fasting Lipid Panel No results for input(s): CHOL, HDL, LDLCALC, TRIG, CHOLHDL, LDLDIRECT in the last 72 hours. Thyroid Function Tests No results for input(s): TSH, T4TOTAL, T3FREE, THYROIDAB in the last 72 hours.  Invalid input(s): FREET3  Other results:   Imaging    DG Chest Port 1 View  Result Date: 05/15/2021 CLINICAL DATA:  Central line placement. EXAM: PORTABLE CHEST 1 VIEW COMPARISON:  Chest radiograph dated May 09, 2021 FINDINGS: Interval placement of left IJ access central line with distal tip in  the SVC. Right PICC with distal tip in the distal SVC is unchanged. Stable cardiomegaly with pulmonary vascular congestion. Small bilateral pleural effusions, unchanged. The osseous structures are unremarkable. IMPRESSION: 1. Interval placement of left IJ access central line with distal tip in the SVC. 2.  No other significant interval change. Electronically Signed   By: Keane Police D.O.   On: 05/15/2021 14:40     Medications:     Scheduled Medications:  apixaban  5 mg Oral BID   atorvastatin  80 mg Oral Daily   Chlorhexidine Gluconate Cloth  6 each Topical Daily   folic acid  1 mg Oral Daily   mouth rinse  15 mL Mouth Rinse BID   midodrine  5 mg Oral TID WC   multivitamin with minerals  1 tablet Oral Daily   senna-docusate  1 tablet Oral BID   sodium chloride flush  10-40 mL Intracatheter Q12H   sodium chloride flush  3 mL Intravenous Q12H   sodium chloride flush  3 mL Intravenous Q12H   thiamine  100 mg Oral Daily    Infusions:   prismasol BGK 4/2.5 500 mL/hr at 05/16/21 0435    prismasol BGK 4/2.5 300 mL/hr at 05/15/21 1819   sodium chloride     sodium chloride     amiodarone 30 mg/hr (05/16/21 0700)   prismasol BGK 4/2.5 1,500 mL/hr at 05/16/21 0435    PRN Medications: sodium chloride, sodium chloride, acetaminophen, ALPRAZolam, benzonatate, heparin, heparin, ondansetron (ZOFRAN) IV, pentafluoroprop-tetrafluoroeth, polyethylene glycol, sodium chloride flush, sodium chloride flush, sodium chloride flush    Assessment/Plan   1. CAD: Suspect delayed presentation LCx (versus RCA) MI.  Echo with EF 35-40%, inferolateral and basal inferior akinesis, moderate-severe eccentric likely infarct-related MR, normal RV.  He was not cathed initially due to AKI and delayed presentation. No s/s angina - LHC with occluded RCA (culprit), diffuse moderate disease in LAD and Lcx. No good PCI targets. Not a candidate for CABG. - No chest pain. - Continue statin - Continue aspirin 2. Acute  systolic CHF/cardiogenic shock: Ischemic cardiomyopathy with infarct-related MR. Echo with EF 35-40%, inferolateral and basal inferior akinesis, moderate-severe eccentric likely infarct-related MR, normal RV.  Echo does not look bad enough to explain low output HF, concerned that MR may be playing a role.  TEE on 01/09 with EF 35-40%, moderate MR (suspect infarct-related), aortic dilatation measuring 44 mm, moderate AI.  - RHC on 0.25 mil: RA mean 16, PA 66/26 (41), PCWP 27, PA 62%, Fick CO 5.04/ CI 2.56  - Early AM Co-ox 46% off milrinone. Repeat Co-ox  - CVVH started 11/11 for worsening oliguric renal failure  - CVP 11-12 today. CVVH for volume removal  - No beta blocker or other GDMT for now with recent  shock and AKI 3. Hyponatremia: 129 >131 > 130 >132  > 128 >131 - restrict FW - Monitor 4. Valvular heart disease: - MR moderate in severity on TEE. Likely infarct related. Moderate AI. EF 35-40% 5. Atrial fibrillation: First noted this admission.   - S/P TEE followed DC-CV--> NSR - Back in AF today. Controlled ventricular rate. - Continue amio gtt   - On eliquis 5 mg twice a day.  - WIll ask pharmacy /TOC to check cost.  6. AKI on CKD 3: Baseline creatinine around 1.4 in 6/22. Peak at 3.09 and trended down, now 2.2 -> 2.6 -> 3.1 > 4.0. > 5.3 > 6.58 >7.26.  - Nephrology consulted. 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 CVVH started 1/11 - Continue CVVH per nephrology, appreciate their assistance  - SBP > 100 on midodrine 5 TID - Renal US with no obstruction 7. Active smoker: Needs to quit.  8. ETOH abuse: Needs to cut back.  9. Thrombocytopenia: Mild, pre-dates heparin initiation. Resolved. 10. Hyperglycemia: - Continue SSI  - A1c 5.7   Length of Stay: [address] PA-C  05/16/2021, 7:29 AM  Advanced Heart Failure Team Pager [phone] (M-F; 7a - 5p)  Please contact [facility] for night-coverage after hours (5p -7a ) and weekends on [email or web address]  Patient seen with PA, agree with the above  note.    With rising creatinine and nausea, CVVH started yesterday.  Pulling net UF 50 cc/hr. Feels better this morning, nausea resolved. Denies dyspnea.  CVP 17 on my read.  MAP stable on midodrine 5 tid.  He is back in AF with controlled rate.     General: NAD Neck: JVP 16 cm, no thyromegaly or thyroid nodule.  Lungs: Clear to auscultation bilaterally with normal respiratory effort. CV: Nondisplaced PMI.  Heart regular S1/S2, no S3/S4, 2/6 SEM RUSB.  No peripheral edema.   Abdomen: Soft, nontender, no hepatosplenomegaly, no distention.  Skin: Intact without lesions or rashes.  Neurologic: Alert and oriented x 3.  Psych: Normal affect. Extremities: No clubbing or cyanosis.  HEENT: Normal.     He remains in rate-controlled AF.  Continue heparin gtt and amiodarone gtt 30 mg/hr.  If he does not convert on his own over time, would repeat DCCV.    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 BP stable.  Co-ox has been good but down to 46% when done early am.  CVP 17 this morning.  - Repeat co-ox, if remains low can add low dose dobutamine => 49%, will add dobutamine 2.  - Needs more UF, pull net negative 100 cc/hr with elevated CVP.   Can unhook from CVVH to allow him to walk.   CRITICAL CARE Performed by: Loralie Champagne  Total critical care time: 35 minutes  Critical care time was exclusive of separately billable procedures and treating other patients.  Critical care was necessary to treat or prevent imminent or life-threatening deterioration.  Critical care was time spent personally by me on the following activities: development of treatment plan with patient and/or surrogate as well as nursing, discussions with consultants, evaluation of patient's response to treatment, examination of patient, obtaining history from patient or surrogate, ordering and performing treatments and interventions, ordering and review of laboratory studies, ordering and review of  radiographic studies, pulse oximetry and re-evaluation of patient's condition.  Loralie Champagne 05/16/2021 7:58 AM

## 2021-05-17 DIAGNOSIS — N179 Acute kidney failure, unspecified: Secondary | ICD-10-CM | POA: Diagnosis not present

## 2021-05-17 DIAGNOSIS — R570 Cardiogenic shock: Secondary | ICD-10-CM | POA: Diagnosis not present

## 2021-05-17 LAB — RENAL FUNCTION PANEL
Albumin: 3.1 g/dL — ABNORMAL LOW (ref 3.5–5.0)
Albumin: 3.1 g/dL — ABNORMAL LOW (ref 3.5–5.0)
Anion gap: 9 (ref 5–15)
Anion gap: 11 (ref 5–15)
BUN: 12 mg/dL (ref 8–23)
BUN: 9 mg/dL (ref 8–23)
CO2: 25 mmol/L (ref 22–32)
CO2: 25 mmol/L (ref 22–32)
Calcium: 8 mg/dL — ABNORMAL LOW (ref 8.9–10.3)
Calcium: 8.6 mg/dL — ABNORMAL LOW (ref 8.9–10.3)
Chloride: 99 mmol/L (ref 98–111)
Chloride: 101 mmol/L (ref 98–111)
Creatinine, Ser: 2.89 mg/dL — ABNORMAL HIGH (ref 0.61–1.24)
Creatinine, Ser: 2.71 mg/dL — ABNORMAL HIGH (ref 0.61–1.24)
GFR, Estimated: 22 mL/min — ABNORMAL LOW (ref >60)
GFR, Estimated: 24 mL/min — ABNORMAL LOW (ref >60)
Glucose, Bld: 140 mg/dL — ABNORMAL HIGH (ref 70–99)
Glucose, Bld: 146 mg/dL — ABNORMAL HIGH (ref 70–99)
Phosphorus: 1.9 mg/dL — ABNORMAL LOW (ref 2.5–4.6)
Phosphorus: 1.6 mg/dL — ABNORMAL LOW (ref 2.5–4.6)
Potassium: 4.3 mmol/L (ref 3.5–5.1)
Potassium: 4.8 mmol/L (ref 3.5–5.1)
Sodium: 133 mmol/L — ABNORMAL LOW (ref 135–145)
Sodium: 137 mmol/L (ref 135–145)

## 2021-05-17 LAB — ECHO TEE
MV M vel: 4.17 m/s
MV Peak grad: 69.6 mmHg
Radius: 0.4 cm

## 2021-05-17 LAB — COOXEMETRY PANEL
Carboxyhemoglobin: 1 % (ref 0.5–1.5)
Carboxyhemoglobin: 1.1 % (ref 0.5–1.5)
Methemoglobin: 0.7 % (ref 0.0–1.5)
Methemoglobin: 0.6 % (ref 0.0–1.5)
O2 Saturation: 62.5 %
O2 Saturation: 68.5 %
Total hemoglobin: 13.3 g/dL (ref 12.0–16.0)
Total hemoglobin: 12.6 g/dL (ref 12.0–16.0)

## 2021-05-17 LAB — GLUCOSE, CAPILLARY
Glucose-Capillary: 118 mg/dL — ABNORMAL HIGH (ref 70–99)
Glucose-Capillary: 114 mg/dL — ABNORMAL HIGH (ref 70–99)
Glucose-Capillary: 95 mg/dL (ref 70–99)
Glucose-Capillary: 129 mg/dL — ABNORMAL HIGH (ref 70–99)

## 2021-05-17 LAB — APTT
aPTT: >200 seconds (ref 24–36)
aPTT: 152 seconds — ABNORMAL HIGH (ref 24–36)

## 2021-05-17 LAB — HEPARIN LEVEL (UNFRACTIONATED): Heparin Unfractionated: >1.1 IU/mL — ABNORMAL HIGH (ref 0.30–0.70)

## 2021-05-17 LAB — MAGNESIUM: Magnesium: 2.3 mg/dL (ref 1.7–2.4)

## 2021-05-17 MED ORDER — HEPARIN (PORCINE) 25000 UT/250ML-% IV SOLN
700.0000 [IU]/h | INTRAVENOUS | Status: DC
  Administered 2021-05-17 – 2021-05-18 (×2): 500 [IU]/h via INTRAVENOUS
  Administered 2021-05-19: 20:00:00 550 [IU]/h via INTRAVENOUS
  Administered 2021-05-21: 700 [IU]/h via INTRAVENOUS
  Filled 2021-05-17 (×2): qty 250

## 2021-05-17 MED ORDER — HEPARIN (PORCINE) 25000 UT/250ML-% IV SOLN
700.0000 [IU]/h | INTRAVENOUS | Status: DC
  Administered 2021-05-17 (×2): 700 [IU]/h via INTRAVENOUS
  Filled 2021-05-17: qty 250

## 2021-05-17 NOTE — Progress Notes (Signed)
ANTICOAGULATION CONSULT NOTE   Pharmacy Consult for Heparin Indication: atrial fibrillation Brief A/P: aPTT remains elevated  Hold, then decrease heraprin rate  Allergies  Allergen Reactions   Other Other (See Comments)    Pollen - unknown    Patient Measurements: Height: 5\' 11"  (180.3 cm) Weight: 78.7 kg (173 lb 8 oz) IBW/kg (Calculated) : 75.3 Heparin Dosing Weight: 78 kg  Vital Signs: Temp: 98.8 F (37.1 C) (01/13 0400) Temp Source: Axillary (01/13 0400) BP: 111/58 (01/13 0500) Pulse Rate: 74 (01/13 0500)  Labs: Recent Labs    05/15/21 0354 05/15/21 1628 05/16/21 0407 05/16/21 1621 05/17/21 0310 05/17/21 0328  HGB 12.5*  --  12.8*  --   --   --   HCT 36.3*  --  37.2*  --   --   --   PLT 173  --  147*  --   --   --   APTT  --   --   --  >200*  --  >200*  HEPARINUNFRC  --   --   --   --  >1.10*  --   CREATININE 6.58*   < > 4.32* 3.33* 2.89*  --    < > = values in this interval not displayed.     Estimated Creatinine Clearance: 23.5 mL/min (A) (by C-G formula based on SCr of 2.89 mg/dL (H)).   Assessment: 76 y.o. male with H/O Afib, Eliquis on hold, for heparin Goal of Therapy:  Heparin level 0.3-0.7 units/ml; aPTT 66-102 sec Monitor platelets by anticoagulation protocol: Yes   Plan:  Hold heparin until 0700, then decrease heparin 700 units/hr Check aPTT in 8 hours.  Phillis Knack, PharmD, BCPS   05/17/2021 5:39 AM

## 2021-05-17 NOTE — Progress Notes (Signed)
[place] for Eliquis > Heparin Indication: atrial fibrillation  Allergies  Allergen Reactions   Other Other (See Comments)    Pollen - unknown    Patient Measurements: Height: 5\' 11"  (180.3 cm) Weight: 74.4 kg (164 lb 0.4 oz) IBW/kg (Calculated) : 75.3 Heparin Dosing Weight: 78 kg  Vital Signs: Temp: 98.6 F (37 C) (01/13 1300) Temp Source: Oral (01/13 1300) BP: 111/63 (01/13 1600) Pulse Rate: 84 (01/13 1600)  Labs: Recent Labs    05/15/21 0354 05/15/21 1628 05/16/21 0407 05/16/21 1621 05/17/21 0310 05/17/21 0328 05/17/21 1639  HGB 12.5*  --  12.8*  --   --   --   --   HCT 36.3*  --  37.2*  --   --   --   --   PLT 173  --  147*  --   --   --   --   APTT  --   --   --  >200*  --  >200* 152*  HEPARINUNFRC  --   --   --   --  >1.10*  --   --   CREATININE 6.58*   < > 4.32* 3.33* 2.89*  --  2.71*   < > = values in this interval not displayed.     Estimated Creatinine Clearance: 24.8 mL/min (A) (by C-G formula based on SCr of 2.71 mg/dL (H)).   Assessment: 76 yo male on chronic Eliquis for afib, now initiated on CRRT and pharmacy asked to transition Eliquis to IV heparin.   Recent DCCV 1/9 > back into AF today.  Last dose of Eliquis given 1/11 at 2200 pm. Heparin level affected by Eliquis so utilizing aPTT for monitoring until levels correlate.  Repeat aPTT remains elevated at 152 seconds, drawn appropriately.   Goal of Therapy:  Heparin level 0.3-0.7 units/ml; aPTT 66-102 sec Monitor platelets by anticoagulation protocol: Yes   Plan:  Hold heparin x1 hour Resume at 500 units/h no bolus Repeat aPTT and heparin level in 8h  Arrie Senate, PharmD, [place], [facility] Clinical Pharmacist [phone] Please check AMION for all [place] numbers 05/17/2021

## 2021-05-17 NOTE — Progress Notes (Signed)
[facility] Progress Note    Assessment/ Plan:    AKI on CKD 3b:normally sees Dr Posey Pronto.  On ARB as OP.  He has had both significant hyper- and hypotensive episodes during this hospitalization requiring treatment and a contrast load on 05/09/21.  Renal US without obstruction and R 6 mm renal calculus.  UA on admission bland. 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  Acute on chronic systolic CHF: EF 16-10%, previously on inotropes, midodrine 5 mg TID -co-ox 49% 05/16/21, improved this AM to 68.5% - back on dobutamine 05/16/21, weaning as tolerated   3.  CAD - LHC 05/09/21, 3V disease - not thought to be a candidate for surgery per CVTS   4.  Late-presenting MI - trops peaked > 20,000   5.  Afib: - s/p DCCV 05/13/21 - on Eliquis and PO amiodarone   6.  MR   7.  Hyponatremia - improving and mild   8.  Dispo: inpatient  Subjective:    Feels better, CRRT running smoothly, significantly net negative yesterday at least 3L.  CVP is 5 this am, improved.     Objective:   BP 107/69 (BP Location: Left Arm)    Pulse 76    Temp 98 F (36.7 C) (Oral)    Resp (!) 22    Ht 5\' 11"  (1.803 m)    Wt 74.4 kg    SpO2 95%    BMI 22.88 kg/m   Intake/Output Summary (Last 24 hours) at 05/17/2021 1002 Last data filed at 05/17/2021 0900 Gross per 24 hour  Intake 1414.52 ml  Output 4337 ml  Net -2922.48 ml   Weight change: -5.5 kg  Physical Exam: GEN lying in bed, appears relatively comfortable HEENT no jaundice NECK no overt JVD PULM clear CV RRR ABD soft EXT edema resolved ACCESS: L IJ HD cath  Imaging: DG Chest Port 1 View  Result Date: 05/15/2021 CLINICAL DATA:  Central line placement. EXAM: PORTABLE CHEST 1 VIEW COMPARISON:  Chest  radiograph dated May 09, 2021 FINDINGS: Interval placement of left IJ access central line with distal tip in the SVC. Right PICC with distal tip in the distal SVC is unchanged. Stable cardiomegaly with pulmonary vascular congestion. Small bilateral pleural effusions, unchanged. The osseous structures are unremarkable. IMPRESSION: 1. Interval placement of left IJ access central line with distal tip in the SVC. 2.  No other significant interval change. Electronically Signed   By: Keane Police D.O.   On: 05/15/2021 14:40    Labs: BMET Recent Labs  Lab 05/13/21 0355 05/14/21 0455 05/15/21 0354 05/15/21 1628 05/16/21 0407 05/16/21 1621 05/17/21 0310  NA 130* 132* 128* 126* 131* 134* 133*  K 3.9 4.1 4.1 4.2 4.0 4.3 4.3  CL 94* 96* 94* 92* 96* 99 99  CO2 25 25 21* 22 22 25 25   GLUCOSE 99 107* 92 102* 137* 138* 140*  BUN 34* 39* 43* 49* 28* 19 12  CREATININE 4.00* 5.27* 6.58* 7.26* 4.32* 3.33* 2.89*  CALCIUM 8.6* 8.4* 8.6* 8.5* 8.1* 8.0* 8.0*  PHOS  --   --   --  6.1* 3.5 2.2* 1.9*   CBC Recent Labs  Lab 05/13/21 0355 05/14/21 0455 05/15/21 0354 05/16/21 0407  WBC 7.1 6.7 6.6 5.5  HGB 12.9* 12.2* 12.5* 12.8*  HCT 36.8* 34.7* 36.3* 37.2*  MCV 92.7 92.8 93.3 93.5  PLT 177 172 173 147*    Medications:     atorvastatin  80 mg Oral Daily   Chlorhexidine Gluconate Cloth  6 each Topical Daily   folic acid  1 mg Oral Daily   mouth rinse  15 mL Mouth Rinse BID   midodrine  5 mg Oral TID WC   multivitamin with minerals  1 tablet Oral Daily   senna-docusate  1 tablet Oral BID   sodium chloride flush  10-40 mL Intracatheter Q12H   sodium chloride flush  3 mL Intravenous Q12H   thiamine  100 mg Oral Daily      Madelon Lips MD 05/17/2021, 10:02 AM

## 2021-05-17 NOTE — Progress Notes (Signed)
Patient ID: Ruben [name], male   DOB: November 12, 1945, 76 y.o.   MRN: [id number]     Advanced Heart Failure Rounding Note  PCP-Cardiologist: Sanda Klein, MD   Subjective:   12/26: Admit with late presentation MI. New AF 12/28: CGS initial co-ox 47%. Started NE + mil. 01/03: SR 05/08/20 Back in A fib and milrinone was cut back to 0.125 mcg.  05/09/20 Milrinone increased back to 0.25. Volume overload, elevated filling pressures on RHC with preserved CO on milrinone. Hypertensive. Back in ICU on nitro gtt.  LHC 1/5: occluded RCA (culprit), diffuse disease in LAD and Lcx. Seen by CVTS. Not felt to be a candidate for surgery. 1/9: S/P TEE followed by DC/CV -->SR.  1/11: Afib w/ RVR>>amio gtt started 1/11: Moved to ICU for CVVH   Co-ox 69% on dobutamine 2. CVP 5-6 this morning, I/Os net negative 3054 with weight down.  250 cc UOP.     He is in atrial fibrillation currently, but was in NSR some overnight.  He remains on amiodarone gtt and heparin gtt.   Feels better today.  No dyspnea or nausea.   Objective:   Weight Range: 74.4 kg Body mass index is 22.88 kg/m.   Vital Signs:   Temp:  [98.2 F (36.8 C)-98.9 F (37.2 C)] 98.8 F (37.1 C) (01/13 0400) Pulse Rate:  [67-98] 76 (01/13 0800) Resp:  [18-33] 22 (01/13 0800) BP: (102-143)/(55-112) 107/69 (01/13 0800) SpO2:  [81 %-97 %] 95 % (01/13 0800) Weight:  [74.4 kg] 74.4 kg (01/13 0555) Last BM Date: 05/16/21  Weight change: Filed Weights   05/15/21 1200 05/16/21 0500 05/17/21 0555  Weight: 79.9 kg 78.7 kg 74.4 kg    Intake/Output:   Intake/Output Summary (Last 24 hours) at 05/17/2021 0803 Last data filed at 05/17/2021 0700 Gross per 24 hour  Intake 1240.38 ml  Output 4360 ml  Net -3119.62 ml      Physical Exam    CVP 5-6  General: NAD Neck: No JVD, no thyromegaly or thyroid nodule.  Lungs: Clear to auscultation bilaterally with normal respiratory effort. CV: Nondisplaced PMI.  Heart irregular S1/S2, no S3/S4,  1/6 HSM apex.  No peripheral edema.   Abdomen: Soft, nontender, no hepatosplenomegaly, no distention.  Skin: Intact without lesions or rashes.  Neurologic: Alert and oriented x 3.  Psych: Normal affect. Extremities: No clubbing or cyanosis.  HEENT: Normal.    Telemetry   AF 70s but some NSR overnight (personally reviewed)   Labs    CBC Recent Labs    05/15/21 0354 05/16/21 0407  WBC 6.6 5.5  HGB 12.5* 12.8*  HCT 36.3* 37.2*  MCV 93.3 93.5  PLT 173 354*   Basic Metabolic Panel Recent Labs    05/16/21 0407 05/16/21 1621 05/17/21 0310  NA 131* 134* 133*  K 4.0 4.3 4.3  CL 96* 99 99  CO2 22 25 25   GLUCOSE 137* 138* 140*  BUN 28* 19 12  CREATININE 4.32* 3.33* 2.89*  CALCIUM 8.1* 8.0* 8.0*  MG 2.2  --  2.3  PHOS 3.5 2.2* 1.9*   Liver Function Tests Recent Labs    05/16/21 1621 05/17/21 0310  ALBUMIN 3.2* 3.1*    No results for input(s): LIPASE, AMYLASE in the last 72 hours. Cardiac Enzymes No results for input(s): CKTOTAL, CKMB, CKMBINDEX, TROPONINI in the last 72 hours.  BNP: BNP (last 3 results) Recent Labs    04/29/21 1251  BNP 1,659.6*    ProBNP (last 3 results) No results  for input(s): PROBNP in the last 8760 hours.   D-Dimer No results for input(s): DDIMER in the last 72 hours. Hemoglobin A1C No results for input(s): HGBA1C in the last 72 hours. Fasting Lipid Panel No results for input(s): CHOL, HDL, LDLCALC, TRIG, CHOLHDL, LDLDIRECT in the last 72 hours. Thyroid Function Tests No results for input(s): TSH, T4TOTAL, T3FREE, THYROIDAB in the last 72 hours.  Invalid input(s): FREET3  Other results:   Imaging    No results found.   Medications:     Scheduled Medications:  atorvastatin  80 mg Oral Daily   Chlorhexidine Gluconate Cloth  6 each Topical Daily   folic acid  1 mg Oral Daily   mouth rinse  15 mL Mouth Rinse BID   midodrine  5 mg Oral TID WC   multivitamin with minerals  1 tablet Oral Daily   senna-docusate  1  tablet Oral BID   sodium chloride flush  10-40 mL Intracatheter Q12H   sodium chloride flush  3 mL Intravenous Q12H   thiamine  100 mg Oral Daily    Infusions:   prismasol BGK 4/2.5 500 mL/hr at 05/17/21 0139    prismasol BGK 4/2.5 300 mL/hr at 05/17/21 0509   sodium chloride     amiodarone 30 mg/hr (05/17/21 0722)   DOBUTamine 2 mcg/kg/min (05/17/21 0700)   heparin 700 Units/hr (05/17/21 0700)   prismasol BGK 4/2.5 1,500 mL/hr at 05/17/21 0406    PRN Medications: sodium chloride, acetaminophen, ALPRAZolam, benzonatate, heparin, heparin, ondansetron (ZOFRAN) IV, pentafluoroprop-tetrafluoroeth, polyethylene glycol, sodium chloride flush, sodium chloride flush    Assessment/Plan   1. CAD: Suspect delayed presentation LCx (versus RCA) MI.  Echo with EF 35-40%, inferolateral and basal inferior akinesis, moderate-severe eccentric likely infarct-related MR, normal RV.  He was not cathed initially due to AKI and delayed presentation. Eventual LHC with occluded RCA (culprit), diffuse moderate disease in LAD and Lcx. No good PCI targets. Not a candidate for CABG. No chest pain.  - Continue statin - Anticoagulated.  2. Acute systolic CHF/cardiogenic shock: Ischemic cardiomyopathy with infarct-related MR. Echo with EF 35-40%, inferolateral and basal inferior akinesis, moderate-severe eccentric likely infarct-related MR, normal RV.  Echo does not look bad enough to explain low output HF, concerned that MR may be playing a role.  TEE on 01/09 with EF 35-40%, moderate MR (suspect infarct-related), aortic dilatation measuring 44 mm, moderate AI. RHC on 0.25 milrinone showed RA mean 16, PA 66/26 (41), PCWP 27, PA 62%, Fick CO 5.04/ CI 2.56.  He was weaned off pressors but restarted dobutamine 2 yesterday with persistently low co-ox.  Today, he is on dobutamine 2 with CVP 5-6 and co-ox 69%. Looks euvolemic on exam, weight down with CVVH.  - Can decrease dobutamine back to 1.  - Continue midodrine 5 tid.   - Think we can run CVVH even to net 50 cc/hr negative today, suspect ready soon for HD holiday to assess recovery versus ongoing iHD.  3. Hyponatremia: Stable Na on CVVH.  4. Valvular heart disease:  MR moderate in severity on TEE. Likely infarct related. Moderate AI.  5. Atrial fibrillation: First noted this admission.  S/P TEE followed DC-CV--> NSR.  Has been back in atrial fibrillation, currently in AF this morning but some NSR overnight.  - Continue amio gtt   - Heparin gtt while on CVVH.  - Hopefully will convert back to NSR, if not may need repeat DCCV.  6. AKI on CKD 3: Baseline creatinine around 1.4 in 6/22.  Peak at 3.09 and trended down, now 2.2 -> 2.6 -> 3.1 > 4.0. > 5.3 > 6.58 >7.26 > CVVH started. 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 Renal US with no hydronephrosis. CVVH started 1/11. This morning, looks euvolemic with CVP 5-6.  Will discuss with renal but think we can likely hold on CVVH to see if renal function recovers versus needs iHD.  250 cc UOP last day.  - SBP > 100 on midodrine 5 TID 7. Active smoker: Needs to quit.  8. ETOH abuse: Needs to cut back.  9. Thrombocytopenia: Mild, pre-dates heparin initiation.  10. Hyperglycemia: - Continue SSI  - A1c 5.7  Needs to walk.    CRITICAL CARE Performed by: Loralie Champagne  Total critical care time: 35 minutes  Critical care time was exclusive of separately billable procedures and treating other patients.  Critical care was necessary to treat or prevent imminent or life-threatening deterioration.  Critical care was time spent personally by me on the following activities: development of treatment plan with patient and/or surrogate as well as nursing, discussions with consultants, evaluation of patient's response to treatment, examination of patient, obtaining history from patient or surrogate, ordering and performing treatments and interventions, ordering and review of laboratory studies, ordering and review of radiographic studies, pulse  oximetry and re-evaluation of patient's condition.  Loralie Champagne 05/17/2021 8:03 AM

## 2021-05-18 DIAGNOSIS — N183 Chronic kidney disease, stage 3 unspecified: Secondary | ICD-10-CM

## 2021-05-18 LAB — MAGNESIUM: Magnesium: 2.4 mg/dL (ref 1.7–2.4)

## 2021-05-18 LAB — CBC
HCT: 33.7 % — ABNORMAL LOW (ref 39.0–52.0)
Hemoglobin: 11.5 g/dL — ABNORMAL LOW (ref 13.0–17.0)
MCH: 32.4 pg (ref 26.0–34.0)
MCHC: 34.1 g/dL (ref 30.0–36.0)
MCV: 94.9 fL (ref 80.0–100.0)
Platelets: 137 10*3/uL — ABNORMAL LOW (ref 150–400)
RBC: 3.55 MIL/uL — ABNORMAL LOW (ref 4.22–5.81)
RDW: 13.7 % (ref 11.5–15.5)
WBC: 6.3 10*3/uL (ref 4.0–10.5)
nRBC: 0 % (ref 0.0–0.2)

## 2021-05-18 LAB — COOXEMETRY PANEL
Carboxyhemoglobin: 1.3 % (ref 0.5–1.5)
Methemoglobin: 0.7 % (ref 0.0–1.5)
O2 Saturation: 63.8 %
Total hemoglobin: 10.4 g/dL — ABNORMAL LOW (ref 12.0–16.0)

## 2021-05-18 LAB — ALBUMIN: Albumin: 3 g/dL — ABNORMAL LOW (ref 3.5–5.0)

## 2021-05-18 LAB — HEPATITIS B SURFACE ANTIGEN: Hepatitis B Surface Ag: NONREACTIVE

## 2021-05-18 LAB — BASIC METABOLIC PANEL
Anion gap: 9 (ref 5–15)
BUN: 14 mg/dL (ref 8–23)
CO2: 25 mmol/L (ref 22–32)
Calcium: 8.3 mg/dL — ABNORMAL LOW (ref 8.9–10.3)
Chloride: 99 mmol/L (ref 98–111)
Creatinine, Ser: 3.75 mg/dL — ABNORMAL HIGH (ref 0.61–1.24)
GFR, Estimated: 16 mL/min — ABNORMAL LOW (ref >60)
Glucose, Bld: 179 mg/dL — ABNORMAL HIGH (ref 70–99)
Potassium: 4.6 mmol/L (ref 3.5–5.1)
Sodium: 133 mmol/L — ABNORMAL LOW (ref 135–145)

## 2021-05-18 LAB — HEPATITIS B SURFACE ANTIBODY,QUALITATIVE: Hep B S Ab: REACTIVE — AB

## 2021-05-18 LAB — APTT
aPTT: >200 seconds (ref 24–36)
aPTT: 100 seconds — ABNORMAL HIGH (ref 24–36)

## 2021-05-18 LAB — GLUCOSE, CAPILLARY
Glucose-Capillary: 108 mg/dL — ABNORMAL HIGH (ref 70–99)
Glucose-Capillary: 106 mg/dL — ABNORMAL HIGH (ref 70–99)
Glucose-Capillary: 135 mg/dL — ABNORMAL HIGH (ref 70–99)

## 2021-05-18 LAB — HEPARIN LEVEL (UNFRACTIONATED): Heparin Unfractionated: >1.1 IU/mL — ABNORMAL HIGH (ref 0.30–0.70)

## 2021-05-18 LAB — PHOSPHORUS: Phosphorus: 1.9 mg/dL — ABNORMAL LOW (ref 2.5–4.6)

## 2021-05-18 MED ORDER — CHLORHEXIDINE GLUCONATE CLOTH 2 % EX PADS
6.0000 | MEDICATED_PAD | Freq: Every day | CUTANEOUS | Status: DC
  Administered 2021-05-18 – 2021-05-28 (×10): 6 via TOPICAL

## 2021-05-18 MED ORDER — ALTEPLASE 2 MG IJ SOLR
2.0000 mg | Freq: Once | INTRAMUSCULAR | Status: DC | PRN
  Filled 2021-05-18: qty 2

## 2021-05-18 MED ORDER — FUROSEMIDE 10 MG/ML IJ SOLN
80.0000 mg | Freq: Once | INTRAMUSCULAR | Status: AC
  Administered 2021-05-18: 80 mg via INTRAVENOUS
  Filled 2021-05-18: qty 8

## 2021-05-18 MED ORDER — PENTAFLUOROPROP-TETRAFLUOROETH EX AERO
1.0000 "application " | INHALATION_SPRAY | CUTANEOUS | Status: DC | PRN
  Filled 2021-05-18: qty 116

## 2021-05-18 MED ORDER — HEPARIN SODIUM (PORCINE) 1000 UNIT/ML DIALYSIS
1000.0000 [IU] | INTRAMUSCULAR | Status: DC | PRN
  Filled 2021-05-18 (×2): qty 1

## 2021-05-18 MED ORDER — LIDOCAINE HCL (PF) 1 % IJ SOLN: 5.0000 mL | INTRAMUSCULAR | Status: DC | PRN

## 2021-05-18 MED ORDER — SODIUM CHLORIDE 0.9 % IV SOLN: 100.0000 mL | INTRAVENOUS | Status: DC | PRN

## 2021-05-18 MED ORDER — MIDODRINE HCL 5 MG PO TABS
10.0000 mg | ORAL_TABLET | Freq: Three times a day (TID) | ORAL | Status: DC
  Administered 2021-05-18 – 2021-05-28 (×30): 10 mg via ORAL
  Filled 2021-05-18 (×32): qty 2

## 2021-05-18 MED ORDER — LIDOCAINE-PRILOCAINE 2.5-2.5 % EX CREA
1.0000 "application " | TOPICAL_CREAM | CUTANEOUS | Status: DC | PRN
  Filled 2021-05-18: qty 5

## 2021-05-18 NOTE — Progress Notes (Signed)
Patient ID: Ruben [name], male   DOB: 1945/10/07, 76 y.o.   MRN: [id number]     Advanced Heart Failure Rounding Note  PCP-Cardiologist: Sanda Klein, MD   Subjective:   12/26: Admit with late presentation MI. New AF 12/28: CGS initial co-ox 47%. Started NE + mil. 01/03: SR 05/08/20 Back in A fib and milrinone was cut back to 0.125 mcg.  05/09/20 Milrinone increased back to 0.25. Volume overload, elevated filling pressures on RHC with preserved CO on milrinone. Hypertensive. Back in ICU on nitro gtt.  LHC 1/5: occluded RCA (culprit), diffuse disease in LAD and Lcx. Seen by CVTS. Not felt to be a candidate for surgery. 1/9: S/P TEE followed by DC/CV -->SR.  1/11: Afib w/ RVR>>amio gtt started 1/11: Moved to ICU for CVVH  1/13: Stopped CVVH  Co-ox 64% on dobutamine 1. CVVH stopped yesterday but only 140 cc UOP and creatinine rising.  CVP higher at 10 today.     He is in atrial fibrillation currently, but was in NSR for part of the night.  He remains on amiodarone gtt and heparin gtt.   No dyspnea or nausea.   Objective:   Weight Range: 74.9 kg Body mass index is 23.03 kg/m.   Vital Signs:   Temp:  [97.9 F (36.6 C)-99.3 F (37.4 C)] 97.9 F (36.6 C) (01/14 0300) Pulse Rate:  [71-88] 75 (01/14 0600) Resp:  [19-30] 20 (01/14 0600) BP: (80-146)/(40-115) 118/72 (01/14 0600) SpO2:  [84 %-98 %] 92 % (01/14 0600) Weight:  [74.9 kg] 74.9 kg (01/14 0500) Last BM Date: 05/17/21  Weight change: Filed Weights   05/16/21 0500 05/17/21 0555 05/18/21 0500  Weight: 78.7 kg 74.4 kg 74.9 kg    Intake/Output:   Intake/Output Summary (Last 24 hours) at 05/18/2021 0802 Last data filed at 05/18/2021 0600 Gross per 24 hour  Intake 727 ml  Output 590 ml  Net 137 ml      Physical Exam    CVP 10  General: NAD Neck: JVP 10 cm, no thyromegaly or thyroid nodule.  Lungs: Clear to auscultation bilaterally with normal respiratory effort. CV: Nondisplaced PMI.  Heart irregular  S1/S2, no S3/S4, 2/6 HSM apex.  No peripheral edema.   Abdomen: Soft, nontender, no hepatosplenomegaly, no distention.  Skin: Intact without lesions or rashes.  Neurologic: Alert and oriented x 3.  Psych: Normal affect. Extremities: No clubbing or cyanosis.  HEENT: Normal.    Telemetry   AF 70s but some NSR overnight (personally reviewed)   Labs    CBC Recent Labs    05/16/21 0407 05/18/21 0339  WBC 5.5 6.3  HGB 12.8* 11.5*  HCT 37.2* 33.7*  MCV 93.5 94.9  PLT 147* 379*   Basic Metabolic Panel Recent Labs    05/17/21 0310 05/17/21 1639 05/18/21 0339  NA 133* 137 133*  K 4.3 4.8 4.6  CL 99 101 99  CO2 25 25 25   GLUCOSE 140* 146* 179*  BUN 12 9 14   CREATININE 2.89* 2.71* 3.75*  CALCIUM 8.0* 8.6* 8.3*  MG 2.3  --  2.4  PHOS 1.9* 1.6* 1.9*   Liver Function Tests Recent Labs    05/17/21 1639 05/18/21 0339  ALBUMIN 3.1* 3.0*    No results for input(s): LIPASE, AMYLASE in the last 72 hours. Cardiac Enzymes No results for input(s): CKTOTAL, CKMB, CKMBINDEX, TROPONINI in the last 72 hours.  BNP: BNP (last 3 results) Recent Labs    04/29/21 1251  BNP 1,659.6*    ProBNP (  last 3 results) No results for input(s): PROBNP in the last 8760 hours.   D-Dimer No results for input(s): DDIMER in the last 72 hours. Hemoglobin A1C No results for input(s): HGBA1C in the last 72 hours. Fasting Lipid Panel No results for input(s): CHOL, HDL, LDLCALC, TRIG, CHOLHDL, LDLDIRECT in the last 72 hours. Thyroid Function Tests No results for input(s): TSH, T4TOTAL, T3FREE, THYROIDAB in the last 72 hours.  Invalid input(s): FREET3  Other results:   Imaging    No results found.   Medications:     Scheduled Medications:  atorvastatin  80 mg Oral Daily   Chlorhexidine Gluconate Cloth  6 each Topical Daily   folic acid  1 mg Oral Daily   mouth rinse  15 mL Mouth Rinse BID   midodrine  5 mg Oral TID WC   multivitamin with minerals  1 tablet Oral Daily    senna-docusate  1 tablet Oral BID   sodium chloride flush  10-40 mL Intracatheter Q12H   sodium chloride flush  3 mL Intravenous Q12H   thiamine  100 mg Oral Daily    Infusions:   prismasol BGK 4/2.5 500 mL/hr at 05/17/21 1216    prismasol BGK 4/2.5 300 mL/hr at 05/17/21 0509   sodium chloride     amiodarone 30 mg/hr (05/18/21 0600)   DOBUTamine 1 mcg/kg/min (05/18/21 0600)   heparin Stopped (05/18/21 0506)   prismasol BGK 4/2.5 1,500 mL/hr at 05/17/21 1435    PRN Medications: sodium chloride, acetaminophen, ALPRAZolam, benzonatate, heparin, heparin, ondansetron (ZOFRAN) IV, pentafluoroprop-tetrafluoroeth, polyethylene glycol, sodium chloride flush, sodium chloride flush    Assessment/Plan   1. CAD: Suspect delayed presentation LCx (versus RCA) MI.  Echo with EF 35-40%, inferolateral and basal inferior akinesis, moderate-severe eccentric likely infarct-related MR, normal RV.  He was not cathed initially due to AKI and delayed presentation. Eventual LHC with occluded RCA (culprit), diffuse moderate disease in LAD and Lcx. No good PCI targets. Not a candidate for CABG. No chest pain.  - Continue statin - Anticoagulated.  2. Acute systolic CHF/cardiogenic shock: Ischemic cardiomyopathy with infarct-related MR. Echo with EF 35-40%, inferolateral and basal inferior akinesis, moderate-severe eccentric likely infarct-related MR, normal RV.  Echo does not look bad enough to explain low output HF, concerned that MR may be playing a role.  TEE on 01/09 with EF 35-40%, moderate MR (suspect infarct-related), aortic dilatation measuring 44 mm, moderate AI. RHC on 0.25 milrinone showed RA mean 16, PA 66/26 (41), PCWP 27, PA 62%, Fick CO 5.04/ CI 2.56.  He was weaned off pressors but restarted dobutamine with persistently low co-ox.  Today, he is on dobutamine 1 with CVP 10 and co-ox 64%. CVVH stopped on 1/13 but volume and creatinine trending back up with poor UOP.  CVP now up to 10.  Suspect he is going  to end up needing iHD, his BP should tolerate this.  - Keep dobutamine at 1 today while we see if he has any renal recovery.  Will likely stop if he is committed to [facility].  - Continue midodrine 5 tid.  - Nephrology following, as above will probably end up needing iHD for volume management.  3. Hyponatremia: 133 today.  4. Valvular heart disease:  MR moderate in severity on TEE. Likely infarct related. Moderate AI.  5. Atrial fibrillation: First noted this admission.  S/P TEE followed DC-CV--> NSR.  Has been back in atrial fibrillation, currently in AF this morning but some NSR overnight.  - Continue amio gtt   -  Heparin gtt while on CVVH.  - Hopefully will stay in NSR once dobutamine stopped.  6. AKI on CKD 3: Baseline creatinine around 1.4 in 6/22. Peak at 3.09 and trended down, now 2.2 -> 2.6 -> 3.1 > 4.0. > 5.3 > 6.58 >7.26 > CVVH started. 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 Renal US with no hydronephrosis. CVVH started 1/11 and stopped 1/13.  However, minimal UOP and creatinine rising.  CVP also higher.  Suspect he will end up needing iHD.  - Continue current midodrine to maintain MAP.  - Continue current dobutamine for now, can likely stop if we commit him to iHD.  7. Active smoker: Needs to quit.  8. ETOH abuse: Needs to cut back.  9. Thrombocytopenia: Mild, pre-dates heparin initiation.  10. Hyperglycemia: - Continue SSI  - A1c 5.7  Needs to walk.    CRITICAL CARE Performed by: Loralie Champagne  Total critical care time: 35 minutes  Critical care time was exclusive of separately billable procedures and treating other patients.  Critical care was necessary to treat or prevent imminent or life-threatening deterioration.  Critical care was time spent personally by me on the following activities: development of treatment plan with patient and/or surrogate as well as nursing, discussions with consultants, evaluation of patient's response to treatment, examination of patient, obtaining history from patient  or surrogate, ordering and performing treatments and interventions, ordering and review of laboratory studies, ordering and review of radiographic studies, pulse oximetry and re-evaluation of patient's condition.  Loralie Champagne 05/18/2021 8:02 AM

## 2021-05-18 NOTE — Progress Notes (Signed)
ANTICOAGULATION CONSULT NOTE   Pharmacy Consult for Heparin Indication: atrial fibrillation Brief A/P: aPTT remains elevated  Hold, then decrease heparin rate  Allergies  Allergen Reactions   Other Other (See Comments)    Pollen - unknown    Patient Measurements: Height: 5\' 11"  (180.3 cm) Weight: 74.4 kg (164 lb 0.4 oz) IBW/kg (Calculated) : 75.3 Heparin Dosing Weight: 78 kg  Vital Signs: Temp: 97.9 F (36.6 C) (01/14 0300) Temp Source: Axillary (01/14 0300) BP: 90/47 (01/14 0300) Pulse Rate: 84 (01/14 0300)  Labs: Recent Labs    05/16/21 0407 05/16/21 1621 05/17/21 0310 05/17/21 0328 05/17/21 1639 05/18/21 0339  HGB 12.8*  --   --   --   --  11.5*  HCT 37.2*  --   --   --   --  33.7*  PLT 147*  --   --   --   --  137*  APTT  --    < >  --  >200* 152* >200*  HEPARINUNFRC  --   --  >1.10*  --   --  >1.10*  CREATININE 4.32*   < > 2.89*  --  2.71* 3.75*   < > = values in this interval not displayed.     Estimated Creatinine Clearance: 17.9 mL/min (A) (by C-G formula based on SCr of 3.75 mg/dL (H)).   Assessment: 76 y.o. male with H/O Afib, Eliquis on hold, for heparin Goal of Therapy:  Heparin level 0.3-0.7 units/ml; aPTT 66-102 sec Monitor platelets by anticoagulation protocol: Yes   Plan:  Hold heparin until 0700, then decrease heparin 400 units/hr Check aPTT in 8 hours.  Phillis Knack, PharmD, BCPS   05/18/2021 5:01 AM

## 2021-05-18 NOTE — Progress Notes (Signed)
[facility] Progress Note    Assessment/ Plan:    AKI on CKD 3b:normally sees Dr Posey Pronto.  On ARB as OP.  He has had both significant hyper- and hypotensive episodes during this hospitalization requiring treatment and a contrast load on 05/09/21.  Renal US without obstruction and R 6 mm renal calculus.  UA on admission bland. - repeat UA bland again 05/14/21 - I suspect ATN + CIN combination + cardiorenal sydrome - CRRT 05/15/21-03/17/22 - still oliguric and no signs of renal recovery yet - will challenge with Lasix 80 IV x 1 to see if this augments UOP today--> however he will likely still need dialysis going forward so have placed orders for IHD today. - increase midodrine to 10 mg TID   2.  Acute on chronic systolic CHF: EF 08-02%, previously on inotropes, midodrine 5 mg TID -co-ox 49% 05/16/21, improved1/13/23AM to 68.5%--> 63.5%  - weaned off dobutamine 1/14   3.  CAD - LHC 05/09/21, 3V disease - not thought to be a candidate for surgery per CVTS   4.  Late-presenting MI - trops peaked > 20,000   5.  Afib: - s/p DCCV 05/13/21 - on Eliquis and PO amiodarone   6.  MR   7.  Hyponatremia - improving and mild   8.  Dispo: inpatient  Subjective:    CRRT taken down yesterday, off pressor as of this AM.  Making a little bit of urine but not much.  CVP up to 10 this AM.  Cr 2.7--> 3.7.   Objective:   BP 110/66    Pulse 85    Temp 98.5 F (36.9 C) (Oral)    Resp (!) 22    Ht 5\' 11"  (1.803 m)    Wt 74.9 kg    SpO2 93%    BMI 23.03 kg/m   Intake/Output Summary (Last 24 hours) at 05/18/2021 0950 Last data filed at 05/18/2021 0900 Gross per 24 hour  Intake 759.95 ml  Output 547 ml  Net 212.95 ml   Weight change: 0.5 kg  Physical Exam: GEN lying in bed, appears relatively comfortable HEENT no jaundice NECK + JVD PULM clear CV RRR ABD soft EXT edema resolved ACCESS: L IJ HD cath  Imaging: No results found.  Labs: BMET Recent Labs  Lab 05/15/21 0354  05/15/21 1628 05/16/21 0407 05/16/21 1621 05/17/21 0310 05/17/21 1639 05/18/21 0339  NA 128* 126* 131* 134* 133* 137 133*  K 4.1 4.2 4.0 4.3 4.3 4.8 4.6  CL 94* 92* 96* 99 99 101 99  CO2 21* 22 22 25 25 25 25   GLUCOSE 92 102* 137* 138* 140* 146* 179*  BUN 43* 49* 28* 19 12 9 14   CREATININE 6.58* 7.26* 4.32* 3.33* 2.89* 2.71* 3.75*  CALCIUM 8.6* 8.5* 8.1* 8.0* 8.0* 8.6* 8.3*  PHOS  --  6.1* 3.5 2.2* 1.9* 1.6* 1.9*   CBC Recent Labs  Lab 05/14/21 0455 05/15/21 0354 05/16/21 0407 05/18/21 0339  WBC 6.7 6.6 5.5 6.3  HGB 12.2* 12.5* 12.8* 11.5*  HCT 34.7* 36.3* 37.2* 33.7*  MCV 92.8 93.3 93.5 94.9  PLT 172 173 147* 137*    Medications:     atorvastatin  80 mg Oral Daily   Chlorhexidine Gluconate Cloth  6 each Topical [id number]   folic acid  1 mg Oral Daily   furosemide  80 mg Intravenous Once   mouth rinse  15 mL Mouth Rinse BID   midodrine  10 mg Oral TID WC  multivitamin with minerals  1 tablet Oral Daily   senna-docusate  1 tablet Oral BID   sodium chloride flush  10-40 mL Intracatheter Q12H   sodium chloride flush  3 mL Intravenous Q12H   thiamine  100 mg Oral Daily      Madelon Lips MD 05/18/2021, 9:50 AM

## 2021-05-18 NOTE — Progress Notes (Addendum)
[place] for Eliquis > Heparin Indication: atrial fibrillation  Allergies  Allergen Reactions   Other Other (See Comments)    Pollen - unknown    Patient Measurements: Height: 5\' 11"  (180.3 cm) Weight: 74.9 kg (165 lb 2 oz) IBW/kg (Calculated) : 75.3 Heparin Dosing Weight: 78 kg  Vital Signs: Temp: 98.4 F (36.9 C) (01/14 1600) Temp Source: Oral (01/14 1600) BP: 106/61 (01/14 1600) Pulse Rate: 81 (01/14 1600)  Labs: Recent Labs    05/16/21 0407 05/16/21 1621 05/17/21 0310 05/17/21 0328 05/17/21 1639 05/18/21 0339 05/18/21 1421  HGB 12.8*  --   --   --   --  11.5*  --   HCT 37.2*  --   --   --   --  33.7*  --   PLT 147*  --   --   --   --  137*  --   APTT  --    < >  --    < > 152* >200* 100*  HEPARINUNFRC  --   --  >1.10*  --   --  >1.10*  --   CREATININE 4.32*   < > 2.89*  --  2.71* 3.75*  --    < > = values in this interval not displayed.     Estimated Creatinine Clearance: 18 mL/min (A) (by C-G formula based on SCr of 3.75 mg/dL (H)).   Assessment: 76 yo male on chronic Eliquis for afib, now initiated on CRRT and pharmacy asked to transition Eliquis to IV heparin.   Recent DCCV 1/9 > back into AF today.  Last dose of Eliquis given 1/11 at 2200 pm. Heparin level affected by Eliquis so utilizing aPTT for monitoring until levels correlate.  aPTT therapeutic this afternoon at 100 seconds on 500 units/h. Will empirically reduce slightly to prevent further accumulation.  Goal of Therapy:  Heparin level 0.3-0.7 units/ml; aPTT 66-102 sec Monitor platelets by anticoagulation protocol: Yes   Plan:  Reduce heparin to 450 units/h Recheck aPTT and heparin level in am  Arrie Senate, PharmD, [place], [facility] Clinical Pharmacist [phone] Please check AMION for all [facility] Pharmacy numbers 05/18/2021

## 2021-05-19 LAB — RENAL FUNCTION PANEL
Albumin: 3.3 g/dL — ABNORMAL LOW (ref 3.5–5.0)
Anion gap: 13 (ref 5–15)
BUN: 10 mg/dL (ref 8–23)
CO2: 26 mmol/L (ref 22–32)
Calcium: 8.9 mg/dL (ref 8.9–10.3)
Chloride: 96 mmol/L — ABNORMAL LOW (ref 98–111)
Creatinine, Ser: 3.76 mg/dL — ABNORMAL HIGH (ref 0.61–1.24)
GFR, Estimated: 16 mL/min — ABNORMAL LOW (ref >60)
Glucose, Bld: 109 mg/dL — ABNORMAL HIGH (ref 70–99)
Phosphorus: 2.2 mg/dL — ABNORMAL LOW (ref 2.5–4.6)
Potassium: 4.2 mmol/L (ref 3.5–5.1)
Sodium: 135 mmol/L (ref 135–145)

## 2021-05-19 LAB — COOXEMETRY PANEL
Carboxyhemoglobin: 1.2 % (ref 0.5–1.5)
Methemoglobin: 0.7 % (ref 0.0–1.5)
O2 Saturation: 62.6 %
Total hemoglobin: 14 g/dL (ref 12.0–16.0)

## 2021-05-19 LAB — APTT
aPTT: 51 seconds — ABNORMAL HIGH (ref 24–36)
aPTT: 61 seconds — ABNORMAL HIGH (ref 24–36)

## 2021-05-19 LAB — HEPARIN LEVEL (UNFRACTIONATED): Heparin Unfractionated: >1.1 IU/mL — ABNORMAL HIGH (ref 0.30–0.70)

## 2021-05-19 LAB — MAGNESIUM: Magnesium: 2 mg/dL (ref 1.7–2.4)

## 2021-05-19 LAB — CBC
HCT: 37.3 % — ABNORMAL LOW (ref 39.0–52.0)
Hemoglobin: 12.7 g/dL — ABNORMAL LOW (ref 13.0–17.0)
MCH: 32.1 pg (ref 26.0–34.0)
MCHC: 34 g/dL (ref 30.0–36.0)
MCV: 94.2 fL (ref 80.0–100.0)
Platelets: 147 10*3/uL — ABNORMAL LOW (ref 150–400)
RBC: 3.96 MIL/uL — ABNORMAL LOW (ref 4.22–5.81)
RDW: 13.7 % (ref 11.5–15.5)
WBC: 6.2 10*3/uL (ref 4.0–10.5)
nRBC: 0 % (ref 0.0–0.2)

## 2021-05-19 LAB — GLUCOSE, CAPILLARY
Glucose-Capillary: 126 mg/dL — ABNORMAL HIGH (ref 70–99)
Glucose-Capillary: 108 mg/dL — ABNORMAL HIGH (ref 70–99)
Glucose-Capillary: 128 mg/dL — ABNORMAL HIGH (ref 70–99)

## 2021-05-19 MED ORDER — FUROSEMIDE 10 MG/ML IJ SOLN
80.0000 mg | Freq: Once | INTRAMUSCULAR | Status: AC
  Administered 2021-05-19: 80 mg via INTRAVENOUS
  Filled 2021-05-19: qty 8

## 2021-05-19 NOTE — Progress Notes (Signed)
[facility] Progress Note    Assessment/ Plan:    AKI on CKD 3b:normally sees Dr Posey Pronto.  On ARB as OP.  He has had both significant hyper- and hypotensive episodes during this hospitalization requiring treatment and a contrast load on 05/09/21.  Renal US without obstruction and R 6 mm renal calculus.  UA on admission bland. - repeat UA bland again 05/14/21 - I suspect ATN + CIN combination + cardiorenal sydrome - CRRT 05/15/21-03/17/22 - some increased UOP with IV Lasix 1/14-- 550 mL - on midodrine 10 TID - HD 05/18/21 with 2.4L UF - next planned for 05/20/21, IV Lasix again today    2.  Acute on chronic systolic CHF: EF 45-40%, previously on inotropes, midodrine 5 mg TID -co-ox 49% 05/16/21, improved1/13/23AM to 68.5%--> 63.5%  - weaning dobutamine   3.  CAD - LHC 05/09/21, 3V disease - not thought to be a candidate for surgery per CVTS   4.  Late-presenting MI - trops peaked > 20,000   5.  Afib: - s/p DCCV 05/13/21 - PO amiodarone - Eliquis --> hep gtt   6.  MR   7.  Hyponatremia - improving and mild   8.  Dispo: inpatient  Subjective:    S/p HD yesterday with 2.4L off, tolerated well.  On/ off dobutamine @ 1 mcg.  Intermittent nausea   Objective:   BP (!) 109/57 (BP Location: Right Arm)    Pulse 71    Temp 98.3 F (36.8 C) (Oral)    Resp (!) 21    Ht 5\' 11"  (1.803 m)    Wt 72.5 kg    SpO2 93%    BMI 22.29 kg/m   Intake/Output Summary (Last 24 hours) at 05/19/2021 1012 Last data filed at 05/19/2021 0800 Gross per 24 hour  Intake 492.76 ml  Output 3050 ml  Net -2557.24 ml   Weight change: -2.4 kg  Physical Exam: GEN lying in bed, appears relatively comfortable HEENT no jaundice NECK + JVD but improved PULM clear CV RRR ABD soft EXT edema resolved ACCESS: L IJ HD cath  Imaging: No results found.  Labs: BMET Recent Labs  Lab 05/15/21 1628 05/16/21 0407 05/16/21 1621 05/17/21 0310 05/17/21 1639 05/18/21 0339 05/19/21 0523  NA 126* 131*  134* 133* 137 133* 135  K 4.2 4.0 4.3 4.3 4.8 4.6 4.2  CL 92* 96* 99 99 101 99 96*  CO2 22 22 25 25 25 25 26   GLUCOSE 102* 137* 138* 140* 146* 179* 109*  BUN 49* 28* 19 12 9 14 10   CREATININE 7.26* 4.32* 3.33* 2.89* 2.71* 3.75* 3.76*  CALCIUM 8.5* 8.1* 8.0* 8.0* 8.6* 8.3* 8.9  PHOS 6.1* 3.5 2.2* 1.9* 1.6* 1.9* 2.2*   CBC Recent Labs  Lab 05/15/21 0354 05/16/21 0407 05/18/21 0339 05/19/21 0523  WBC 6.6 5.5 6.3 6.2  HGB 12.5* 12.8* 11.5* 12.7*  HCT 36.3* 37.2* 33.7* 37.3*  MCV 93.3 93.5 94.9 94.2  PLT 173 147* 137* 147*    Medications:     atorvastatin  80 mg Oral Daily   Chlorhexidine Gluconate Cloth  6 each Topical J8119   folic acid  1 mg Oral Daily   mouth rinse  15 mL Mouth Rinse BID   midodrine  10 mg Oral TID WC   multivitamin with minerals  1 tablet Oral Daily   senna-docusate  1 tablet Oral BID   sodium chloride flush  10-40 mL Intracatheter Q12H   sodium chloride flush  3  mL Intravenous Q12H   thiamine  100 mg Oral Daily      Madelon Lips MD 05/19/2021, 10:12 AM

## 2021-05-19 NOTE — Progress Notes (Signed)
[place] for Eliquis > Heparin Indication: atrial fibrillation  Allergies  Allergen Reactions   Other Other (See Comments)    Pollen - unknown    Patient Measurements: Height: 5\' 11"  (180.3 cm) Weight: 72.5 kg (159 lb 13.3 oz) IBW/kg (Calculated) : 75.3 Heparin Dosing Weight: 78 kg  Vital Signs: Temp: 98.7 F (37.1 C) (01/15 1602) Temp Source: Oral (01/15 1602) BP: 111/63 (01/15 1400) Pulse Rate: 71 (01/15 1400)  Labs: Recent Labs    05/17/21 0310 05/17/21 0328 05/17/21 1639 05/18/21 0339 05/18/21 1421 05/19/21 0523 05/19/21 1616  HGB  --   --   --  11.5*  --  12.7*  --   HCT  --   --   --  33.7*  --  37.3*  --   PLT  --   --   --  137*  --  147*  --   APTT  --    < > 152* >200* 100* 51* 61*  HEPARINUNFRC >1.10*  --   --  >1.10*  --  >1.10*  --   CREATININE 2.89*  --  2.71* 3.75*  --  3.76*  --    < > = values in this interval not displayed.     Estimated Creatinine Clearance: 17.4 mL/min (A) (by C-G formula based on SCr of 3.76 mg/dL (H)).   Assessment: 76 yo male on chronic Eliquis for afib, now initiated on CRRT and pharmacy asked to transition Eliquis to IV heparin.   Recent DCCV 1/9 > back into AF today.  Last dose of Eliquis given 1/11 at 2200 pm. Heparin level affected by Eliquis so utilizing aPTT for monitoring until levels correlate.  aPTT this afternoon slightly below goal at 61 seconds.   Goal of Therapy:  Heparin level 0.3-0.7 units/ml; aPTT 66-102 sec Monitor platelets by anticoagulation protocol: Yes   Plan:  Increase heparin to 550 units/h Repeat aPTT and heparin level in am   Arrie Senate, PharmD, [place], [facility] Clinical Pharmacist [phone] Please check AMION for all [facility] numbers 05/19/2021

## 2021-05-19 NOTE — Progress Notes (Signed)
Patient ID: Ruben [name], male   DOB: 1945-08-08, 76 y.o.   MRN: [id number]     Advanced Heart Failure Rounding Note  PCP-Cardiologist: Sanda Klein, MD   Subjective:   12/26: Admit with late presentation MI. New AF 12/28: CGS initial co-ox 47%. Started NE + mil. 01/03: SR 05/08/20 Back in A fib and milrinone was cut back to 0.125 mcg.  05/09/20 Milrinone increased back to 0.25. Volume overload, elevated filling pressures on RHC with preserved CO on milrinone. Hypertensive. Back in ICU on nitro gtt.  LHC 1/5: occluded RCA (culprit), diffuse disease in LAD and Lcx. Seen by CVTS. Not felt to be a candidate for surgery. 1/9: S/P TEE followed by DC/CV -->SR.  1/11: Afib w/ RVR>>amio gtt started 1/11: Moved to ICU for CVVH  1/13: Stopped CVVH 1/14: iHD  Co-ox 63% on dobutamine 1. Patient had iHD yesterday, tolerated well.  BP stable.  CVP 6 today.     He is in NSR currently, still in and out of AF.  He remains on amiodarone gtt and heparin gtt.   No dyspnea or nausea.   Objective:   Weight Range: 72.5 kg Body mass index is 22.29 kg/m.   Vital Signs:   Temp:  [98.2 F (36.8 C)-98.8 F (37.1 C)] 98.3 F (36.8 C) (01/15 0724) Pulse Rate:  [66-94] 71 (01/15 0800) Resp:  [12-29] 21 (01/15 0800) BP: (84-131)/(47-74) 109/57 (01/15 0800) SpO2:  [90 %-96 %] 93 % (01/15 0800) Weight:  [72.5 kg] 72.5 kg (01/15 0500) Last BM Date: 05/18/21  Weight change: Filed Weights   05/17/21 0555 05/18/21 0500 05/19/21 0500  Weight: 74.4 kg 74.9 kg 72.5 kg    Intake/Output:   Intake/Output Summary (Last 24 hours) at 05/19/2021 0807 Last data filed at 05/19/2021 0800 Gross per 24 hour  Intake 537.82 ml  Output 3050 ml  Net -2512.18 ml      Physical Exam    CVP 6 General: NAD Neck: No JVD, no thyromegaly or thyroid nodule.  Lungs: Clear to auscultation bilaterally with normal respiratory effort. CV: Nondisplaced PMI.  Heart regular S1/S2, no S3/S4, 2/6 HSM apex.  No peripheral  edema.  Abdomen: Soft, nontender, no hepatosplenomegaly, no distention.  Skin: Intact without lesions or rashes.  Neurologic: Alert and oriented x 3.  Psych: Normal affect. Extremities: No clubbing or cyanosis.  HEENT: Normal.    Telemetry   NSR 70s but some AF overnight (personally reviewed)   Labs    CBC Recent Labs    05/18/21 0339 05/19/21 0523  WBC 6.3 6.2  HGB 11.5* 12.7*  HCT 33.7* 37.3*  MCV 94.9 94.2  PLT 137* 323*   Basic Metabolic Panel Recent Labs    05/18/21 0339 05/19/21 0523  NA 133* 135  K 4.6 4.2  CL 99 96*  CO2 25 26  GLUCOSE 179* 109*  BUN 14 10  CREATININE 3.75* 3.76*  CALCIUM 8.3* 8.9  MG 2.4 2.0  PHOS 1.9* 2.2*   Liver Function Tests Recent Labs    05/18/21 0339 05/19/21 0523  ALBUMIN 3.0* 3.3*    No results for input(s): LIPASE, AMYLASE in the last 72 hours. Cardiac Enzymes No results for input(s): CKTOTAL, CKMB, CKMBINDEX, TROPONINI in the last 72 hours.  BNP: BNP (last 3 results) Recent Labs    04/29/21 1251  BNP 1,659.6*    ProBNP (last 3 results) No results for input(s): PROBNP in the last 8760 hours.   D-Dimer No results for input(s): DDIMER in the  last 72 hours. Hemoglobin A1C No results for input(s): HGBA1C in the last 72 hours. Fasting Lipid Panel No results for input(s): CHOL, HDL, LDLCALC, TRIG, CHOLHDL, LDLDIRECT in the last 72 hours. Thyroid Function Tests No results for input(s): TSH, T4TOTAL, T3FREE, THYROIDAB in the last 72 hours.  Invalid input(s): FREET3  Other results:   Imaging    No results found.   Medications:     Scheduled Medications:  atorvastatin  80 mg Oral Daily   Chlorhexidine Gluconate Cloth  6 each Topical L8921   folic acid  1 mg Oral Daily   mouth rinse  15 mL Mouth Rinse BID   midodrine  10 mg Oral TID WC   multivitamin with minerals  1 tablet Oral Daily   senna-docusate  1 tablet Oral BID   sodium chloride flush  10-40 mL Intracatheter Q12H   sodium chloride  flush  3 mL Intravenous Q12H   thiamine  100 mg Oral Daily    Infusions:  sodium chloride     sodium chloride     sodium chloride     amiodarone 30 mg/hr (05/19/21 0800)   heparin 450 Units/hr (05/19/21 0800)    PRN Medications: sodium chloride, sodium chloride, sodium chloride, acetaminophen, ALPRAZolam, alteplase, benzonatate, heparin, lidocaine (PF), lidocaine-prilocaine, ondansetron (ZOFRAN) IV, pentafluoroprop-tetrafluoroeth, pentafluoroprop-tetrafluoroeth, polyethylene glycol, sodium chloride flush, sodium chloride flush    Assessment/Plan   1. CAD: Suspect delayed presentation LCx (versus RCA) MI.  Echo with EF 35-40%, inferolateral and basal inferior akinesis, moderate-severe eccentric likely infarct-related MR, normal RV.  He was not cathed initially due to AKI and delayed presentation. Eventual LHC with occluded RCA (culprit), diffuse moderate disease in LAD and Lcx. No good PCI targets. Not a candidate for CABG. No chest pain.  - Continue statin - Anticoagulated.  2. Acute systolic CHF/cardiogenic shock: Ischemic cardiomyopathy with infarct-related MR. Echo with EF 35-40%, inferolateral and basal inferior akinesis, moderate-severe eccentric likely infarct-related MR, normal RV.  Echo does not look bad enough to explain low output HF, concerned that MR may be playing a role.  TEE on 01/09 with EF 35-40%, moderate MR (suspect infarct-related), aortic dilatation measuring 44 mm, moderate AI. RHC on 0.25 milrinone showed RA mean 16, PA 66/26 (41), PCWP 27, PA 62%, Fick CO 5.04/ CI 2.56.  He was weaned off pressors but restarted dobutamine with persistently low co-ox.  Today, he is on dobutamine 1 with CVP 6 and co-ox 63%. CVVH stopped on 1/13 but started iHD yesterday.  At this point, looks like he will need iHD ongoing.  - Can stop dobutamine today.  - Continue midodrine 10 tid.  - iHD for volume management.  3. Hyponatremia: Managed via HD.  4. Valvular heart disease:  MR moderate  in severity on TEE. Likely infarct related. Moderate AI.  5. Atrial fibrillation: First noted this admission.  S/P TEE followed DC-CV--> NSR.  In and out of AF, currently NSR.  Hopefully will hold NSR better off dobutamine.  - Stop dobutamine today.  - Continue amio gtt for today, possible to NSR tomorrow if holding NSR more steadily.  - Heparin gtt until access finalized then Eliquis. .  6. AKI on CKD 3: Baseline creatinine around 1.4 in 6/22. Peak at 3.09 and trended down, now 2.2 -> 2.6 -> 3.1 > 4.0. > 5.3 > 6.58 >7.26 > CVVH started. 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 Renal US with no hydronephrosis. CVVH started 1/11 and stopped 1/13.  However, minimal UOP and creatinine rose so started iHD  on 1/14.  Follow for further iHD needs, he did also make 550 cc urine yesterday.  Suspect he will need iHD for now.   - Continue current midodrine to maintain MAP.  - nephrology following for access issues, etc.  7. Active smoker: Needs to quit.  8. ETOH abuse: Needs to cut back.  9. Thrombocytopenia: Mild, pre-dates heparin initiation.  10. Hyperglycemia: - Continue SSI  - A1c 5.7  Walk in hall.   Loralie Champagne 05/19/2021 8:07 AM

## 2021-05-19 NOTE — Progress Notes (Signed)
[place] for Eliquis > Heparin Indication: atrial fibrillation  Allergies  Allergen Reactions   Other Other (See Comments)    Pollen - unknown    Patient Measurements: Height: 5\' 11"  (180.3 cm) Weight: 72.5 kg (159 lb 13.3 oz) IBW/kg (Calculated) : 75.3 Heparin Dosing Weight: 78 kg  Vital Signs: Temp: 98.3 F (36.8 C) (01/15 0724) Temp Source: Oral (01/15 0724) BP: 109/57 (01/15 0800) Pulse Rate: 71 (01/15 0800)  Labs: Recent Labs    05/17/21 0310 05/17/21 0328 05/17/21 1639 05/18/21 0339 05/18/21 1421 05/19/21 0523  HGB  --   --   --  11.5*  --  12.7*  HCT  --   --   --  33.7*  --  37.3*  PLT  --   --   --  137*  --  147*  APTT  --    < > 152* >200* 100* 51*  HEPARINUNFRC >1.10*  --   --  >1.10*  --  >1.10*  CREATININE 2.89*  --  2.71* 3.75*  --  3.76*   < > = values in this interval not displayed.     Estimated Creatinine Clearance: 17.4 mL/min (A) (by C-G formula based on SCr of 3.76 mg/dL (H)).   Assessment: 76 yo male on chronic Eliquis for afib, now initiated on iHD and pharmacy asked to transition Eliquis to IV heparin.   Recent DCCV 1/9 to NSR but has since been in and out of AF. Currently on amio gtt.  Last dose of Eliquis given 1/11 at 2200 pm.   Heparin level affected by Eliquis so utilizing aPTT for monitoring until levels correlate.  aPTT subtherapeutic this morning at 51 seconds on 450 units/h. Will increase to 500 units/hr. Patient  was therapeutic at 500 level prior near upper end of goal and supratherapeutic on all higher doses. Given sensitivity to heparin will not bolus.   Goal of Therapy:  Heparin level 0.3-0.7 units/ml; aPTT 66-102 sec Monitor platelets by anticoagulation protocol: Yes   Plan:  Increase heparin to 500 units/h Recheck aPTT in 7 hours Daily HL, aPTT, and CBC while on heparin Follow-up placement of permanent HD access and then transition back to Eliquis  Cathrine Muster,  PharmD PGY2 Cardiology Pharmacy Resident Phone: [phone]  05/19/2021  8:52 AM  Please check [email or web address] for unit-specific pharmacy phone numbers.

## 2021-05-20 ENCOUNTER — Ambulatory Visit: Payer: Medicare Other | Admitting: Nurse Practitioner

## 2021-05-20 ENCOUNTER — Encounter (HOSPITAL_COMMUNITY): Payer: Self-pay | Admitting: Cardiovascular Disease

## 2021-05-20 LAB — GLUCOSE, CAPILLARY
Glucose-Capillary: 106 mg/dL — ABNORMAL HIGH (ref 70–99)
Glucose-Capillary: 105 mg/dL — ABNORMAL HIGH (ref 70–99)
Glucose-Capillary: 69 mg/dL — ABNORMAL LOW (ref 70–99)
Glucose-Capillary: 148 mg/dL — ABNORMAL HIGH (ref 70–99)
Glucose-Capillary: 94 mg/dL (ref 70–99)

## 2021-05-20 LAB — COOXEMETRY PANEL
Carboxyhemoglobin: 1.2 % (ref 0.5–1.5)
Methemoglobin: 0.7 % (ref 0.0–1.5)
O2 Saturation: 66.6 %
Total hemoglobin: 12.7 g/dL (ref 12.0–16.0)

## 2021-05-20 LAB — CBC
HCT: 36.7 % — ABNORMAL LOW (ref 39.0–52.0)
Hemoglobin: 12.5 g/dL — ABNORMAL LOW (ref 13.0–17.0)
MCH: 32.2 pg (ref 26.0–34.0)
MCHC: 34.1 g/dL (ref 30.0–36.0)
MCV: 94.6 fL (ref 80.0–100.0)
Platelets: 140 K/uL — ABNORMAL LOW (ref 150–400)
RBC: 3.88 MIL/uL — ABNORMAL LOW (ref 4.22–5.81)
RDW: 13.7 % (ref 11.5–15.5)
WBC: 6.6 K/uL (ref 4.0–10.5)
nRBC: 0 % (ref 0.0–0.2)

## 2021-05-20 LAB — HEPARIN LEVEL (UNFRACTIONATED): Heparin Unfractionated: >1.1 IU/mL — ABNORMAL HIGH (ref 0.30–0.70)

## 2021-05-20 LAB — APTT
aPTT: 44 s — ABNORMAL HIGH (ref 24–36)
aPTT: 64 seconds — ABNORMAL HIGH (ref 24–36)

## 2021-05-20 LAB — HEPATITIS B SURFACE ANTIBODY, QUANTITATIVE: Hep B S AB Quant (Post): 39 m[IU]/mL (ref >9.9)

## 2021-05-20 MED ORDER — AMIODARONE HCL 200 MG PO TABS
200.0000 mg | ORAL_TABLET | Freq: Two times a day (BID) | ORAL | Status: DC
  Administered 2021-05-20 – 2021-05-27 (×15): 200 mg via ORAL
  Filled 2021-05-20 (×15): qty 1

## 2021-05-20 MED ORDER — CEFAZOLIN SODIUM-DEXTROSE 2-4 GM/100ML-% IV SOLN: 2.0000 g | INTRAVENOUS | Status: AC

## 2021-05-20 MED ORDER — RENA-VITE PO TABS
1.0000 | ORAL_TABLET | Freq: Every day | ORAL | Status: DC
  Administered 2021-05-20 – 2021-05-27 (×8): 1 via ORAL
  Filled 2021-05-20 (×8): qty 1

## 2021-05-20 MED ORDER — CEFAZOLIN SODIUM-DEXTROSE 2-4 GM/100ML-% IV SOLN: 2.0000 g | INTRAVENOUS | Status: DC

## 2021-05-20 MED ORDER — ONDANSETRON HCL 4 MG PO TABS
4.0000 mg | ORAL_TABLET | Freq: Three times a day (TID) | ORAL | Status: DC
  Administered 2021-05-20 – 2021-05-28 (×20): 4 mg via ORAL
  Filled 2021-05-20 (×20): qty 1

## 2021-05-20 NOTE — Progress Notes (Addendum)
Patient ID: Ruben [name], male   DOB: 1945-08-22, 76 y.o.   MRN: [id number]     Advanced Heart Failure Rounding Note  PCP-Cardiologist: Sanda Klein, MD   Subjective:   12/26: Admit with late presentation MI. New AF 12/28: CGS initial co-ox 47%. Started NE + mil. 01/03: SR 05/08/20 Back in A fib and milrinone was cut back to 0.125 mcg.  05/09/20 Milrinone increased back to 0.25. Volume overload, elevated filling pressures on RHC with preserved CO on milrinone. Hypertensive. Back in ICU on nitro gtt.  LHC 1/5: occluded RCA (culprit), diffuse disease in LAD and Lcx. Seen by CVTS. Not felt to be a candidate for surgery. 1/9: S/P TEE followed by DC/CV -->SR.  1/11: Afib w/ RVR>>amio gtt started 1/11: Moved to ICU for CVVH  1/13: Stopped CVVH 1/14: iHD 1/15: DBA stopped. Given IV lasix with 10 cc urine output.   Co-ox 66%  off  dobutamine   Complaining of nausea. Poor appetite.    Objective:   Weight Range: 73.4 kg Body mass index is 22.57 kg/m.   Vital Signs:   Temp:  [98.1 F (36.7 C)-98.9 F (37.2 C)] 98.1 F (36.7 C) (01/16 0600) Pulse Rate:  [62-79] 74 (01/16 0600) Resp:  [16-25] 25 (01/16 0600) BP: (88-119)/(54-79) 104/61 (01/16 0600) SpO2:  [89 %-95 %] 94 % (01/16 0600) Weight:  [73.4 kg] 73.4 kg (01/16 0500) Last BM Date: 05/19/21  Weight change: Filed Weights   05/18/21 0500 05/19/21 0500 05/20/21 0500  Weight: 74.9 kg 72.5 kg 73.4 kg    Intake/Output:   Intake/Output Summary (Last 24 hours) at 05/20/2021 0722 Last data filed at 05/20/2021 0600 Gross per 24 hour  Intake 1078.89 ml  Output 10 ml  Net 1068.89 ml      Physical Exam   General:  Sitting in the chair.  No resp difficulty HEENT: normal Neck: supple. no JVD. Carotids 2+ bilat; no bruits. No lymphadenopathy or thryomegaly appreciated. LIJ Cor: PMI nondisplaced. Regular rate & rhythm. No rubs, gallops or murmurs. Lungs: clear Abdomen: soft, nontender, nondistended. No  hepatosplenomegaly. No bruits or masses. Good bowel sounds. Extremities: no cyanosis, clubbing, rash, edema Neuro: alert & orientedx3, cranial nerves grossly intact. moves all 4 extremities w/o difficulty. Affect pleasant   Telemetry   NSR 60-70s    Labs    CBC Recent Labs    05/19/21 0523 05/20/21 0400  WBC 6.2 6.6  HGB 12.7* 12.5*  HCT 37.3* 36.7*  MCV 94.2 94.6  PLT 147* 768*   Basic Metabolic Panel Recent Labs    05/18/21 0339 05/19/21 0523  NA 133* 135  K 4.6 4.2  CL 99 96*  CO2 25 26  GLUCOSE 179* 109*  BUN 14 10  CREATININE 3.75* 3.76*  CALCIUM 8.3* 8.9  MG 2.4 2.0  PHOS 1.9* 2.2*   Liver Function Tests Recent Labs    05/18/21 0339 05/19/21 0523  ALBUMIN 3.0* 3.3*    No results for input(s): LIPASE, AMYLASE in the last 72 hours. Cardiac Enzymes No results for input(s): CKTOTAL, CKMB, CKMBINDEX, TROPONINI in the last 72 hours.  BNP: BNP (last 3 results) Recent Labs    04/29/21 1251  BNP 1,659.6*    ProBNP (last 3 results) No results for input(s): PROBNP in the last 8760 hours.   D-Dimer No results for input(s): DDIMER in the last 72 hours. Hemoglobin A1C No results for input(s): HGBA1C in the last 72 hours. Fasting Lipid Panel No results for input(s): CHOL, HDL, LDLCALC,  TRIG, CHOLHDL, LDLDIRECT in the last 72 hours. Thyroid Function Tests No results for input(s): TSH, T4TOTAL, T3FREE, THYROIDAB in the last 72 hours.  Invalid input(s): FREET3  Other results:   Imaging    No results found.   Medications:     Scheduled Medications:  atorvastatin  80 mg Oral Daily   Chlorhexidine Gluconate Cloth  6 each Topical I0973   folic acid  1 mg Oral Daily   mouth rinse  15 mL Mouth Rinse BID   midodrine  10 mg Oral TID WC   multivitamin with minerals  1 tablet Oral Daily   senna-docusate  1 tablet Oral BID   sodium chloride flush  10-40 mL Intracatheter Q12H   sodium chloride flush  3 mL Intravenous Q12H   thiamine  100 mg Oral  Daily    Infusions:  sodium chloride     sodium chloride     sodium chloride     amiodarone 30 mg/hr (05/20/21 0600)   heparin 550 Units/hr (05/20/21 0600)    PRN Medications: sodium chloride, sodium chloride, sodium chloride, acetaminophen, ALPRAZolam, alteplase, benzonatate, heparin, lidocaine (PF), lidocaine-prilocaine, ondansetron (ZOFRAN) IV, pentafluoroprop-tetrafluoroeth, pentafluoroprop-tetrafluoroeth, polyethylene glycol, sodium chloride flush, sodium chloride flush    Assessment/Plan   1. CAD: Suspect delayed presentation LCx (versus RCA) MI.  Echo with EF 35-40%, inferolateral and basal inferior akinesis, moderate-severe eccentric likely infarct-related MR, normal RV.  He was not cathed initially due to AKI and delayed presentation. Eventual LHC with occluded RCA (culprit), diffuse moderate disease in LAD and Lcx. No good PCI targets. Not a candidate for CABG. No chest pain.  - Continue statin - Anticoagulated.  2. Acute systolic CHF/cardiogenic shock: Ischemic cardiomyopathy with infarct-related MR. Echo with EF 35-40%, inferolateral and basal inferior akinesis, moderate-severe eccentric likely infarct-related MR, normal RV.  Echo does not look bad enough to explain low output HF, concerned that MR may be playing a role.  TEE on 01/09 with EF 35-40%, moderate MR (suspect infarct-related), aortic dilatation measuring 44 mm, moderate AI. RHC on 0.25 milrinone showed RA mean 16, PA 66/26 (41), PCWP 27, PA 62%, Fick CO 5.04/ CI 2.56.  He was weaned off pressors but restarted dobutamine with persistently low co-ox.   CVVH stopped on 1/13 but started iHD. At this point, looks like he will need iHD ongoing.  - CO-OX stable off dobutamine.   - Continue midodrine 10 tid.  - iHD for volume management.  3. Hyponatremia: Managed via HD.  4. Valvular heart disease:  MR moderate in severity on TEE. Likely infarct related. Moderate AI.  5. Atrial fibrillation: First noted this admission.  S/P  TEE followed DC-CV--> NSR.  In and out of AF, currently NSR.  Hopefully will hold NSR better off dobutamine.  - Stop dobutamine today.  - Maintaining SR. Stop amio drip. Start amio 200 mg twice a day.  - Heparin gtt until access finalized then Eliquis. .  6. AKI on CKD 3: Baseline creatinine around 1.4 in 6/22. Peak at 3.09 and trended down, now 2.2 -> 2.6 -> 3.1 > 4.0. > 5.3 > 6.58 >7.26 > CVVH started. 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 Renal US with no hydronephrosis. CVVH started 1/11 and stopped 1/13.  However, minimal UOP and creatinine rose so started iHD on 1/14.  Follow for further iHD needs, only made 10 cc yesterday.  Suspect he will need iHD for now.   - Continue current midodrine to maintain MAP.  - nephrology following for access issues, etc.  7. Active smoker: Needs to quit.  8. ETOH abuse: Needs to cut back.  9. Thrombocytopenia: Mild, pre-dates heparin initiation.  10. Hyperglycemia: - Continue SSI  - A1c 5.7  Ambulate. May be able to move out to [facility] today.    Amy Clegg NP-C  05/20/2021 7:22 AM  Patient seen with NP, agree with the above note.   He is off milrinone and maintaining NSR on amiodarone gtt.  Co-ox 67%.  No complaints this morning.  Minimal UOP (10 cc).   General: NAD Neck: No JVD, no thyromegaly or thyroid nodule.  Lungs: Clear to auscultation bilaterally with normal respiratory effort. CV: Nondisplaced PMI.  Heart regular S1/S2, no S3/S4, 2/6 SEM RUSB.  No peripheral edema.   Abdomen: Soft, nontender, no hepatosplenomegaly, no distention.  Skin: Intact without lesions or rashes.  Neurologic: Alert and oriented x 3.  Psych: Normal affect. Extremities: No clubbing or cyanosis.  HEENT: Normal.   With good co-ox will keep off milrinone.   Maintaining NSR off milrinone, will stop IV amiodarone this morning and start po.  Continue heparin gtt until he has his long-term HD access then will transition to Eliquis.   Will need HD today with minimal UOP, per renal.   Can go  to step down.   Loralie Champagne 05/20/2021 7:42 AM

## 2021-05-20 NOTE — Plan of Care (Signed)

## 2021-05-20 NOTE — H&P (Signed)
Chief Complaint: Patient was seen in consultation today for image guided tunneled dialysis catheter placement at the request of Dr. Jannifer Hick  Referring Physician(s): Dr. Jannifer Hick   Supervising Physician: Daryll Brod  Patient Status: [facility] - In-pt  History of Present Illness: Ruben [name] is a 76 y.o. male w/ PMH of a fib, CKD stage III, CHF, CAD, ETOH abuse, HLD, HTN, MI, and tobacco abuse. Pt was admitted 04/29/22 after presenting to ED via EMS from [facility] c/o CP and SOB w/ Trop >19,000 concerning for NSTEMI. Pt creatinine has continued to rise during his stay from 1.4-3.76. Pt has temporary LIJ HD catheter in place. Dr Johnney Ou is requesting tunneled HD catheter to continue dialysis.   Past Medical History:  Diagnosis Date   Arthritis    Chronic kidney disease, stage 3b ([place])    ETOH abuse    History of stress test 09/02/2008   showed inferolateral scar without ischemia   Hx of echocardiogram 09/02/2008   was essentially normal   Hyperlipidemia    Hypertension    Tobacco abuse     Past Surgical History:  Procedure Laterality Date   BACK SURGERY     Dr Luiz Ochoa involving L3-L4 discectomy.   CARDIOVERSION N/A 05/13/2021   Procedure: CARDIOVERSION;  Surgeon: Larey Dresser, MD;  Location: [facility] ENDOSCOPY;  Service: Cardiovascular;  Laterality: N/A;   HERNIA REPAIR     RIGHT/LEFT HEART CATH AND CORONARY ANGIOGRAPHY N/A 05/09/2021   Procedure: RIGHT/LEFT HEART CATH AND CORONARY ANGIOGRAPHY;  Surgeon: Larey Dresser, MD;  Location: [place] [facility] LAB;  Service: Cardiovascular;  Laterality: N/A;   SPINE SURGERY     TEE WITHOUT CARDIOVERSION N/A 05/07/2021   Procedure: TRANSESOPHAGEAL ECHOCARDIOGRAM (TEE);  Surgeon: Larey Dresser, MD;  Location: [facility] ENDOSCOPY;  Service: Cardiovascular;  Laterality: N/A;   TEE WITHOUT CARDIOVERSION N/A 05/13/2021   Procedure: TRANSESOPHAGEAL ECHOCARDIOGRAM (TEE);  Surgeon: Larey Dresser, MD;  Location: [facility] ENDOSCOPY;  Service:  Cardiovascular;  Laterality: N/A;    Allergies: Other  Medications: Prior to Admission medications   Medication Sig Start Date End Date Taking? Authorizing Provider  acetaminophen (TYLENOL) 650 MG CR tablet Take 650 mg by mouth every 8 (eight) hours as needed for pain.   Yes [provider]  allopurinol (ZYLOPRIM) 100 MG tablet TAKE 1 TABLET(100 MG) BY MOUTH TWICE DAILY Patient taking differently: Take 100 mg by mouth 2 (two) times daily. 04/15/21  Yes [name], FNP  aspirin 81 MG tablet Take 81 mg by mouth daily.   Yes [provider]  buPROPion (WELLBUTRIN XL) 150 MG 24 hr tablet Take 1 tablet (150 mg total) by mouth every morning. 01/23/20 04/29/21 Yes [name], FNP  cholecalciferol (VITAMIN D3) 25 MCG (1000 UNIT) tablet Take 1,000 Units by mouth daily.   Yes [provider]  diclofenac Sodium (VOLTAREN) 1 % GEL Apply 2 g topically 4 (four) times daily. Patient taking differently: Apply 2 g topically daily as needed (pain). 10/24/19  Yes [name], FNP  linaCLOtide (LINZESS PO) Take 1 tablet by mouth daily. Not sure about mg; got samples from doctor   Yes [provider]  Magnesium 200 MG TABS Take 1 tablet (200 mg total) by mouth daily. With evening meal Patient taking differently: Take 200 mg by mouth daily. 10/22/20  Yes [name], FNP  methocarbamol (ROBAXIN) 500 MG tablet TAKE 1 TABLET(500 MG) BY MOUTH EVERY 8 HOURS AS NEEDED FOR MUSCLE SPASMS Patient taking differently: Take 500 mg  by mouth every 8 (eight) hours as needed for muscle spasms. 10/22/20  Yes [name], FNP  olmesartan (BENICAR) 40 MG tablet Take 1 tablet (40 mg total) by mouth daily. 02/19/21 02/19/22 Yes [name], FNP  simvastatin (ZOCOR) 40 MG tablet Take 1 tablet (40 mg total) by mouth every evening. 10/22/20  Yes [name], FNP  tadalafil (CIALIS) 5 MG tablet Take 1 tablet (5 mg total) by mouth as needed for erectile dysfunction. Patient taking  differently: Take 5 mg by mouth daily as needed for erectile dysfunction. 12/19/20  Yes [name], FNP  albuterol (PROVENTIL) (2.5 MG/3ML) 0.083% nebulizer solution Take 3 mLs (2.5 mg total) by nebulization every 4 (four) hours as needed for wheezing or shortness of breath. Patient not taking: Reported on 04/29/2021 04/03/21 04/03/22  [name], FNP  Meloxicam 15 MG TBDP Take 1 tablet by mouth daily. For 5 days then take once a day as needed Patient not taking: Reported on 04/29/2021 02/18/21   [name], FNP  polyethylene glycol powder (GLYCOLAX/MIRALAX) powder Take 17 g by mouth 2 (two) times daily as needed. Patient not taking: Reported on 04/29/2021 03/04/16   McVey, Gelene Mink, PA-C     Family History  Problem Relation Age of Onset   Heart attack Brother    Diabetes Brother    Heart disease Brother    Cancer Father    Diabetes Brother    Heart disease Brother     Social History   Socioeconomic History   Marital status: Single    Spouse name: Not on file   Number of children: Not on file   Years of education: Not on file   Highest education level: Not on file  Occupational History   Not on file  Tobacco Use   Smoking status: Some Days    Packs/day: 1.50    Years: 20.00    Pack years: 30.00    Types: Cigarettes   Smokeless tobacco: Never   Tobacco comments:    down to 2 packs per week; 3/21 - down to 1 Pack per week  Substance and Sexual Activity   Alcohol use: No    Alcohol/week: 0.0 standard drinks   Drug use: No   Sexual activity: Not on file  Other Topics Concern   Not on file  Social History Narrative   Not on file   Social Determinants of Health   Financial Resource Strain: Medium Risk   Difficulty of Paying Living Expenses: Somewhat hard  Food Insecurity: No Food Insecurity   Worried About Charity fundraiser in the Last Year: Never true   Ran Out of Food in the Last Year: Never true  Transportation Needs: No Transportation Needs    Lack of Transportation (Medical): No   Lack of Transportation (Non-Medical): No  Physical Activity: Not on file  Stress: Not on file  Social Connections: Not on file    Review of Systems: A 12 point ROS discussed and pertinent positives are indicated in the HPI above.  All other systems are negative.  Review of Systems  Constitutional:  Positive for chills. Negative for fever.  HENT:  Negative for nosebleeds.   Eyes:  Negative for visual disturbance.  Respiratory:  Negative for cough and shortness of breath.   Cardiovascular:  Negative for chest pain and leg swelling.  Gastrointestinal:  Positive for nausea. Negative for abdominal pain, blood in stool and vomiting.  Genitourinary:  Negative for hematuria.  Neurological:  Negative for dizziness, light-headedness and  headaches.   Vital Signs: BP 122/63 (BP Location: Right Arm)    Pulse 74    Temp 98.1 F (36.7 C)    Resp (!) 25    Ht 5\' 11"  (1.803 m)    Wt 161 lb 13.1 oz (73.4 kg)    SpO2 97%    BMI 22.57 kg/m   Physical Exam Constitutional:      Appearance: Normal appearance. He is not ill-appearing.  HENT:     Head: Normocephalic and atraumatic.     Mouth/Throat:     Mouth: Mucous membranes are moist.     Pharynx: Oropharynx is clear.  Cardiovascular:     Rate and Rhythm: Normal rate and regular rhythm.     Pulses: Normal pulses.     Heart sounds: Normal heart sounds. No murmur heard.   No friction rub. No gallop.  Pulmonary:     Effort: Pulmonary effort is normal. No respiratory distress.     Breath sounds: Normal breath sounds. No stridor. No wheezing, rhonchi or rales.  Abdominal:     General: Bowel sounds are normal. There is no distension.     Palpations: Abdomen is soft.     Tenderness: There is no abdominal tenderness. There is no guarding.  Musculoskeletal:     Right lower leg: No edema.     Left lower leg: No edema.  Skin:    General: Skin is warm and dry.  Neurological:     Mental Status: He is alert and  oriented to person, place, and time.  Psychiatric:        Mood and Affect: Mood normal.        Behavior: Behavior normal.        Thought Content: Thought content normal.        Judgment: Judgment normal.    Imaging: CARDIAC CATHETERIZATION  Result Date: 05/09/2021   Prox RCA to Mid RCA lesion is 100% stenosed.   Ost Cx to Prox Cx lesion is 40% stenosed.   Prox Cx to Mid Cx lesion is 50% stenosed.   Mid Cx lesion is 80% stenosed.   Dist Cx lesion is 80% stenosed.   Mid LM lesion is 30% stenosed.   Prox LAD lesion is 60% stenosed.   1st Diag lesion is 70% stenosed.   Mid LAD-1 lesion is 70% stenosed.   Mid LAD-2 lesion is 50% stenosed.   Mid LAD-3 lesion is 50% stenosed.   Dist LAD lesion is 40% stenosed.   Ramus lesion is 60% stenosed. 1. Elevated left and right heart filling pressures, preserved cardiac output on milrinone 0.25. 2. Occluded RCA, this is cause of his out of hospital MI prior to admission.  Collaterals from the left system. 3. Diffuse moderate disease throughout the LAD and LCx.  Suspect the totality of disease would lead to ischemia in both territories.  No good PCI targets given diffuse disease. Options for revascularization will likely be limited.  No PCI target.  Will ask for TCTS opinion on CABG, but with CHF and renal failure he would be a difficult candidate.   US RENAL  Result Date: 05/14/2021 CLINICAL DATA:  Acute kidney injury. EXAM: RENAL / URINARY TRACT ULTRASOUND COMPLETE COMPARISON:  None. FINDINGS: Right Kidney: Renal measurements: 11.6 x 5.7 x 5.7 cm = volume: 199 mL. Increased echogenicity of renal parenchyma is noted. 6 mm nonobstructive calculus is seen in upper pole. Two simple cysts are noted, the largest measuring 2.1 cm. No mass or hydronephrosis visualized. Left  Kidney: Renal measurements: 12.0 x 6.3 x 6.3 cm = volume: 247 mL. Increased echogenicity is noted suggesting medical renal disease. 2 simple cysts are noted, the largest measuring 1.9 cm. No mass or  hydronephrosis visualized. Bladder: Appears normal for degree of bladder distention. Other: Moderately enlarged prostate gland is noted. IMPRESSION: Increased echogenicity of renal parenchyma is noted bilaterally consistent with medical renal disease. Bilateral simple renal cysts are noted. No hydronephrosis or renal obstruction is noted. Nonobstructive right renal calculus is noted. Moderate prostatic enlargement is noted. Electronically Signed   By: Marijo Conception M.D.   On: 05/14/2021 09:16   DG Chest Port 1 View  Result Date: 05/15/2021 CLINICAL DATA:  Central line placement. EXAM: PORTABLE CHEST 1 VIEW COMPARISON:  Chest radiograph dated May 09, 2021 FINDINGS: Interval placement of left IJ access central line with distal tip in the SVC. Right PICC with distal tip in the distal SVC is unchanged. Stable cardiomegaly with pulmonary vascular congestion. Small bilateral pleural effusions, unchanged. The osseous structures are unremarkable. IMPRESSION: 1. Interval placement of left IJ access central line with distal tip in the SVC. 2.  No other significant interval change. Electronically Signed   By: Keane Police D.O.   On: 05/15/2021 14:40   DG CHEST PORT 1 VIEW  Result Date: 05/09/2021 CLINICAL DATA:  Shortness of breath and chest pain. EXAM: PORTABLE CHEST 1 VIEW COMPARISON:  Chest x-ray 05/01/2021. FINDINGS: Right upper extremity PICC terminates over the mid SVC, unchanged. The cardiac silhouette is mildly enlarged, unchanged. There is central pulmonary vascular congestion and central and lower lung interstitial opacities which have increased from prior. There is scratch at there are new small bilateral pleural effusions. There is no evidence for pneumothorax or acute fracture. IMPRESSION: 1. Cardiomegaly with new mild interstitial edema and small pleural effusions. Electronically Signed   By: Ronney Asters M.D.   On: 05/09/2021 15:28   DG Chest Port 1 View  Result Date: 05/01/2021 CLINICAL DATA:   Status post PICC line placement. EXAM: PORTABLE CHEST 1 VIEW COMPARISON:  05/01/2021 FINDINGS: Right arm PICC line has been placed. The tip is in the region of the SVC. Heart and mediastinum are stable. Increased hazy and patchy densities in lower lungs compared to the recent exam and likely associated with atelectasis. IMPRESSION: PICC line tip in the SVC region. Electronically Signed   By: Markus Daft M.D.   On: 05/01/2021 11:56   DG CHEST PORT 1 VIEW  Result Date: 05/01/2021 CLINICAL DATA:  Leukocytosis.  Wheezing. EXAM: PORTABLE CHEST 1 VIEW COMPARISON:  04/29/2021 FINDINGS: The cardiac silhouette remains borderline enlarged. Minimal somewhat patchy opacity in the right lower lung zone. Clear left lung. Normal vascularity. No acute bony abnormality. IMPRESSION: Possible minimal right lower lung zone pneumonia or patchy atelectasis. Electronically Signed   By: Claudie Revering M.D.   On: 05/01/2021 09:19   DG Chest Port 1 View  Result Date: 04/29/2021 CLINICAL DATA:  Shortness of breath, respiratory distress, chest pain EXAM: PORTABLE CHEST 1 VIEW COMPARISON:  08/25/2008 FINDINGS: Heart is normal size. Interstitial prominence noted in the mid to lower lungs bilaterally. Small left pleural effusion. No acute bony abnormality. IMPRESSION: Interstitial prominence in the mid and lower lung zones. This could reflect infection, possibly atypical/viral pneumonia. Less likely edema. Small left effusion. Electronically Signed   By: Rolm Baptise M.D.   On: 04/29/2021 13:21   ECHOCARDIOGRAM COMPLETE  Result Date: 04/30/2021    ECHOCARDIOGRAM REPORT   Patient Name:  Ruben [name] [facility] Date of Exam: 04/30/2021 Medical Rec #:  [id number]             Height:       71.0 in Accession #:    [id number]            Weight:       171.3 lb Date of Birth:  10-13-1945             BSA:          1.974 m Patient Age:    56 years              BP:           96/69 mmHg Patient Gender: M                     HR:           110 bpm.  Exam Location:  Inpatient Procedure: 2D Echo Indications:    acute myocardial infarction  History:        Patient has prior history of Echocardiogram examinations, most                 recent 09/25/2008. Chronic kidney disease, Arrythmias:Atrial                 Fibrillation; Risk Factors:Hypertension and Dyslipidemia.  Sonographer:    Johny Chess RDCS Referring Phys: [address] [place]  1. Inferior wal hypokinesis . Left ventricular ejection fraction, by estimation, is 35 to 40%. The left ventricle has moderately decreased function. The left ventricle has no regional wall motion abnormalities. Left ventricular diastolic parameters are indeterminate.  2. Right ventricular systolic function is normal. The right ventricular size is normal.  3. Splay artifact likely ischemic MR given inferior wall motion abnormality . The mitral valve is abnormal. Moderate to severe mitral valve regurgitation. No evidence of mitral stenosis.  4. The aortic valve is tricuspid. There is mild calcification of the aortic valve. There is mild thickening of the aortic valve. Aortic valve regurgitation is mild. Aortic valve sclerosis/calcification is present, without any evidence of aortic stenosis.  5. Aortic dilatation noted. There is mild dilatation of the ascending aorta, measuring 38 mm.  6. The inferior vena cava is normal in size with greater than 50% respiratory variability, suggesting right atrial pressure of 3 mmHg. FINDINGS  Left Ventricle: Inferior wal hypokinesis. Left ventricular ejection fraction, by estimation, is 35 to 40%. The left ventricle has moderately decreased function. The left ventricle has no regional wall motion abnormalities. The left ventricular internal cavity size was normal in size. There is no left ventricular hypertrophy. Left ventricular diastolic parameters are indeterminate. Right Ventricle: The right ventricular size is normal. No increase in right ventricular wall thickness. Right  ventricular systolic function is normal. Left Atrium: Left atrial size was normal in size. Right Atrium: Right atrial size was normal in size. Pericardium: There is no evidence of pericardial effusion. Mitral Valve: Splay artifact likely ischemic MR given inferior wall motion abnormality. The mitral valve is abnormal. There is mild thickening of the mitral valve leaflet(s). There is mild calcification of the mitral valve leaflet(s). Moderate to severe mitral valve regurgitation. No evidence of mitral valve stenosis. Tricuspid Valve: The tricuspid valve is normal in structure. Tricuspid valve regurgitation is not demonstrated. No evidence of tricuspid stenosis. Aortic Valve: The aortic valve is tricuspid. There is mild calcification of the aortic valve. There is mild thickening of the aortic valve. Aortic  valve regurgitation is mild. Aortic valve sclerosis/calcification is present, without any evidence of aortic stenosis. Pulmonic Valve: The pulmonic valve was normal in structure. Pulmonic valve regurgitation is mild. No evidence of pulmonic stenosis. Aorta: The aortic root is normal in size and structure and aortic dilatation noted. There is mild dilatation of the ascending aorta, measuring 38 mm. Venous: The inferior vena cava is normal in size with greater than 50% respiratory variability, suggesting right atrial pressure of 3 mmHg. IAS/Shunts: No atrial level shunt detected by color flow Doppler.  LEFT VENTRICLE PLAX 2D LVIDd:         4.30 cm LVIDs:         3.40 cm LV PW:         1.10 cm LV IVS:        1.00 cm LVOT diam:     1.80 cm LV SV:         27 LV SV Index:   14 LVOT Area:     2.54 cm  RIGHT VENTRICLE         IVC TAPSE (M-mode): 1.1 cm  IVC diam: 2.20 cm LEFT ATRIUM             Index        RIGHT ATRIUM           Index LA diam:        3.00 cm 1.52 cm/m   RA Area:     19.80 cm LA Vol (A2C):   86.8 ml 43.97 ml/m  RA Volume:   59.70 ml  30.24 ml/m LA Vol (A4C):   48.4 ml 24.52 ml/m LA Biplane Vol: 68.5  ml 34.70 ml/m  AORTIC VALVE LVOT Vmax:   69.80 cm/s LVOT Vmean:  44.100 cm/s LVOT VTI:    0.108 m  AORTA Ao Root diam: 3.40 cm Ao Asc diam:  3.80 cm  SHUNTS Systemic VTI:  0.11 m Systemic Diam: 1.80 cm Jenkins Rouge MD Electronically signed by Jenkins Rouge MD Signature Date/Time: 04/30/2021/9:57:29 AM    Final    ECHO TEE  Result Date: 05/17/2021    TRANSESOPHOGEAL ECHO REPORT   Patient Name:   Ruben [name] [facility] Date of Exam: 05/13/2021 Medical Rec #:  [id number]             Height:       71.0 in Accession #:    [id number]            Weight:       171.5 lb Date of Birth:  April 19, 1946             BSA:          1.975 m Patient Age:    63 years              BP:           110/74 mmHg Patient Gender: M                     HR:           114 bpm. Exam Location:  Inpatient Procedure: Transesophageal Echo, Cardiac Doppler and Color Doppler Indications:     R94.31 Abnormal EKG  History:         Patient has prior history of Echocardiogram examinations, most                  recent 05/07/2021. Previous Myocardial Infarction, Abnormal ECG,  Arrythmias:Atrial Fibrillation; Risk Factors:Hypertension,                  Dyslipidemia and Current Smoker. Cardiac shock.  Sonographer:     Roseanna Rainbow RDCS Referring Phys:  [place] Diagnosing Phys: Kirk Ruths McleanMD PROCEDURE: After discussion of the risks and benefits of a TEE, an informed consent was obtained from the patient. The transesophogeal probe was passed without difficulty through the esophogus of the patient. Imaged were obtained with the patient in a left lateral decubitus position. Sedation performed by different physician. The patient was monitored while under deep sedation. Anesthestetic sedation was provided intravenously by Anesthesiology: 250mg  of Propofol, 100mg  of Lidocaine. The patient's vital signs; including heart rate, blood pressure, and oxygen saturation; remained stable throughout the procedure. The patient developed no complications  during the procedure. A successful direct current cardioversion was performed at 200 joules with 1 attempt. IMPRESSIONS  1. Difficult study due to coughing.  2. Left ventricular ejection fraction, by estimation, is 35 to 40%. The left ventricle has moderately decreased function. The left ventricle demonstrates regional wall motion abnormalities with basal to mid inferior and inferolateral akinesis. There is mild left ventricular hypertrophy.  3. Right ventricular systolic function is normal. The right ventricular size is normal.  4. Peak RV-RA gradient 23 mmHg.  5. Left atrial size was mildly dilated. No left atrial/left atrial appendage thrombus was detected.  6. No ASD or PFO by color doppler.  7. The aortic valve is tricuspid. Aortic valve regurgitation is moderate. No aortic stenosis is present.  8. There was moderate, likely infarct-related, mitral regurgitation. PISA ERO 0.09 cm^2 (suspect underestimation). No evidence of mitral stenosis.  9. Normal caliber thoracic aorta with grade IV plaque in the arch. FINDINGS  Left Ventricle: Left ventricular ejection fraction, by estimation, is 35 to 40%. The left ventricle has moderately decreased function. The left ventricle demonstrates regional wall motion abnormalities. The left ventricular internal cavity size was normal in size. There is mild left ventricular hypertrophy. Right Ventricle: The right ventricular size is normal. No increase in right ventricular wall thickness. Right ventricular systolic function is normal. Left Atrium: Left atrial size was mildly dilated. No left atrial/left atrial appendage thrombus was detected. Right Atrium: Right atrial size was normal in size. Pericardium: Trivial pericardial effusion is present. Mitral Valve: There was moderate, likely infarct-related, mitral regurgitation. PISA ERO 0.09 cm^2 (suspect underestimation). The mitral valve is abnormal. Moderate mitral valve regurgitation. No evidence of mitral valve stenosis.  Tricuspid Valve: Peak RV-RA gradient 23 mmHg. The tricuspid valve is normal in structure. Tricuspid valve regurgitation is trivial. Aortic Valve: The aortic valve is tricuspid. Aortic valve regurgitation is moderate. No aortic stenosis is present. Pulmonic Valve: The pulmonic valve was normal in structure. Pulmonic valve regurgitation is not visualized. Aorta: Normal caliber thoracic aorta with grade IV plaque in the arch. The aortic root is normal in size and structure. IAS/Shunts: No ASD or PFO by color doppler.  MR Peak grad:    69.6 mmHg MR Mean grad:    44.0 mmHg MR Vmax:         417.00 cm/s MR Vmean:        297.0 cm/s MR PISA:         1.01 cm MR PISA Eff ROA: 9 mm MR PISA Radius:  0.40 cm Dalton McleanMD Electronically signed by Franki Monte Signature Date/Time: 05/17/2021/2:25:43 PM    Final    ECHO TEE  Result Date: 05/07/2021  TRANSESOPHOGEAL ECHO REPORT   Patient Name:   Ruben [name] [facility] Date of Exam: 05/07/2021 Medical Rec #:  [id number]             Height:       71.0 in Accession #:    [id number]            Weight:       180.3 lb Date of Birth:  1946/02/20             BSA:          2.018 m Patient Age:    73 years              BP:           126/71 mmHg Patient Gender: M                     HR:           91 bpm. Exam Location:  Inpatient Procedure: 3D Echo, Transesophageal Echo, Cardiac Doppler and Color Doppler Indications:     I34.0 Nonrheumatic mitral (valve) insufficiency  History:         Patient has prior history of Echocardiogram examinations, most                  recent 04/30/2021. Previous Myocardial Infarction and CAD; Risk                  Factors:Dyslipidemia and Current Smoker. Cardiac shock.  Sonographer:     Roseanna Rainbow RDCS Referring Phys:  [place] Diagnosing Phys: Kirk Ruths McleanMD PROCEDURE: After discussion of the risks and benefits of a TEE, an informed consent was obtained from the patient. The transesophogeal probe was passed without difficulty through the  esophogus of the patient. Imaged were obtained with the patient in a left lateral decubitus position. Sedation performed by different physician. The patient was monitored while under deep sedation. Anesthestetic sedation was provided intravenously by Anesthesiology: 330mg  of Propofol, 80mg  of Lidocaine. The patient's vital signs; including heart rate, blood pressure, and oxygen saturation; remained stable throughout the procedure. Supplementary images were obtained from transthoracic windows as indicated to answer the clinical question. The patient developed no complications during the procedure. IMPRESSIONS  1. Left ventricular ejection fraction, by estimation, is 35 to 40%. The left ventricle has moderately decreased function. The left ventricle demonstrates regional wall motion abnormalities with basal to mid inferior and inferolateral akinesis.  2. Peak RV-RA gradient 37 mmHg. Right ventricular systolic function is normal. The right ventricular size is normal.  3. Left atrial size was mildly dilated. No left atrial/left atrial appendage thrombus was detected.  4. The aortic valve is tricuspid. Aortic valve regurgitation is moderate. No aortic stenosis is present.  5. No PFO or ASD by color doppler.  6. Aortic dilatation noted. There is moderate dilatation of the ascending aorta, measuring 44 mm.  7. The mitral valve is abnormal. Suspect infarct-related MR with PISA ERO 0.27 cm^2 on TEE images with no systolic flow reversal in the pulmonary vein doppler pattern. However, on TTE images, MR looks worse with PISA ERO 0.48 cm^2. Overall, I think that  the MR is more in the moderate range. No evidence of mitral stenosis. FINDINGS  Left Ventricle: Left ventricular ejection fraction, by estimation, is 35 to 40%. The left ventricle has moderately decreased function. The left ventricle demonstrates regional wall motion abnormalities. The left ventricular internal cavity size was normal in size. There is no  left  ventricular hypertrophy. Right Ventricle: Peak RV-RA gradient 37 mmHg. The right ventricular size is normal. No increase in right ventricular wall thickness. Right ventricular systolic function is normal. Left Atrium: Left atrial size was mildly dilated. No left atrial/left atrial appendage thrombus was detected. Right Atrium: Right atrial size was normal in size. Pericardium: There is no evidence of pericardial effusion. Mitral Valve: The mitral valve is abnormal. Moderate mitral valve regurgitation. No evidence of mitral valve stenosis. Tricuspid Valve: The tricuspid valve is normal in structure. Tricuspid valve regurgitation is trivial. Aortic Valve: The aortic valve is tricuspid. Aortic valve regurgitation is moderate. No aortic stenosis is present. Pulmonic Valve: The pulmonic valve was normal in structure. Pulmonic valve regurgitation is mild. Aorta: Aortic dilatation noted. There is moderate dilatation of the ascending aorta, measuring 44 mm. IAS/Shunts: No PFO or ASD by color doppler.  MR Peak grad:    55.5 mmHg    TRICUSPID VALVE MR Mean grad:    36.5 mmHg    TR Peak grad:   37.0 mmHg MR Vmax:         372.50 cm/s  TR Vmax:        304.00 cm/s MR Vmean:        280.5 cm/s MR PISA:         2.79 cm MR PISA Eff ROA: 46 mm MR PISA Radius:  0.67 cm Dalton McleanMD Electronically signed by Franki Monte Signature Date/Time: 05/07/2021/3:38:06 PM    Final    [place] EKG SITE RITE  Result Date: 05/01/2021 If Site Rite image not attached, placement could not be confirmed due to current cardiac rhythm.   Labs:  CBC: Recent Labs    05/16/21 0407 05/18/21 0339 05/19/21 0523 05/20/21 0400  WBC 5.5 6.3 6.2 6.6  HGB 12.8* 11.5* 12.7* 12.5*  HCT 37.2* 33.7* 37.3* 36.7*  PLT 147* 137* 147* 140*    COAGS: Recent Labs    04/29/21 1531 05/12/21 0355 05/16/21 1621 05/18/21 1421 05/19/21 0523 05/19/21 1616 05/20/21 0400  INR 1.1 1.8*  --   --   --   --   --   APTT  --   --    < > 100* 51* 61* 44*   <  > = values in this interval not displayed.    BMP: Recent Labs    05/17/21 0310 05/17/21 1639 05/18/21 0339 05/19/21 0523  NA 133* 137 133* 135  K 4.3 4.8 4.6 4.2  CL 99 101 99 96*  CO2 25 25 25 26   GLUCOSE 140* 146* 179* 109*  BUN 12 9 14 10   CALCIUM 8.0* 8.6* 8.3* 8.9  CREATININE 2.89* 2.71* 3.75* 3.76*  GFRNONAA 22* 24* 16* 16*    LIVER FUNCTION TESTS: Recent Labs    05/03/21 0351 05/04/21 0330 05/05/21 0505 05/06/21 0457 05/15/21 1628 05/17/21 0310 05/17/21 1639 05/18/21 0339 05/19/21 0523  BILITOT 1.2 1.0 1.0 0.8  --   --   --   --   --   AST 35 25 25 27   --   --   --   --   --   ALT 49* 38 37 35  --   --   --   --   --   ALKPHOS 38 36* 43 41  --   --   --   --   --   PROT 5.8* 5.9* 6.2* 6.0*  --   --   --   --   --   ALBUMIN  3.0* 3.0* 3.2* 3.0*   < > 3.1* 3.1* 3.0* 3.3*   < > = values in this interval not displayed.    TUMOR MARKERS: No results for input(s): AFPTM, CEA, CA199, CHROMGRNA in the last 8760 hours.  Assessment and Plan: History of A fib, CKD stage III, CHF, CAD, ETOH abuse, HLD, HTN, MI, and tobacco abuse. Pt was admitted 04/29/22 after presenting to ED via EMS from [facility] c/o CP and SOB w/ Trop >19,000 concerning for NSTEMI. Pt creatinine has continued to rise during his stay from 1.4-3.76. Pt has temporary LIJ HD catheter in place. Dr Johnney Ou is requesting tunneled HD catheter to continue dialysis.   Pt resting in bed with wife and daughter at bedside. He is A&O, calm and pleasant. He is in no distress.  Pt was advised that procedure is tentatively scheduled for 05/21/21. NPO order placed.  Pt currently on heparin drip, INR 1.8- Heparin to be stopped 2 hours pre-procedure.   Risks and benefits of tunneled dialysis catheter placement was discussed with the patient including, but not limited to bleeding, infection, vascular injury, pneumothorax which may require chest tube placement, air embolism or even death  All of the patient's questions were  answered, patient is agreeable to proceed. Consent signed and in chart.   Thank you for this interesting consult.  I greatly enjoyed meeting [employer] and look forward to participating in their care.  A copy of this report was sent to the requesting provider on this date.  Electronically Signed: Tyson Alias, NP 05/20/2021, 10:17 AM   I spent a total of 30 minutes in face to face in clinical consultation, greater than 50% of which was counseling/coordinating care for image guided tunneled hemodialysis catheter placement.

## 2021-05-20 NOTE — Progress Notes (Signed)
[place] for Eliquis > Heparin Indication: atrial fibrillation  Allergies  Allergen Reactions   Other Other (See Comments)    Pollen - unknown    Patient Measurements: Height: 5\' 11"  (180.3 cm) Weight: 72.4 kg (159 lb 9.8 oz) IBW/kg (Calculated) : 75.3 Heparin Dosing Weight: 78 kg  Vital Signs: Temp: 98.8 F (37.1 C) (01/16 1500) Temp Source: Oral (01/16 1500) BP: 107/58 (01/16 1500) Pulse Rate: 82 (01/16 1500)  Labs: Recent Labs    05/18/21 0339 05/18/21 1421 05/19/21 0523 05/19/21 1616 05/20/21 0400 05/20/21 1600  HGB 11.5*  --  12.7*  --  12.5*  --   HCT 33.7*  --  37.3*  --  36.7*  --   PLT 137*  --  147*  --  140*  --   APTT >200*   < > 51* 61* 44* 64*  HEPARINUNFRC >1.10*  --  >1.10*  --  >1.10*  --   CREATININE 3.75*  --  3.76*  --   --   --    < > = values in this interval not displayed.     Estimated Creatinine Clearance: 17.4 mL/min (A) (by C-G formula based on SCr of 3.76 mg/dL (H)).   Assessment: 76 yo male on chronic Eliquis for afib, now initiated on CRRT and pharmacy asked to transition Eliquis to IV heparin.   Recent DCCV 1/9 > back into AF today.  Last dose of Eliquis given 1/11 at 2200 pm. Heparin level affected by Eliquis so utilizing aPTT for monitoring until levels correlate.  Repeat aPTT remains slightly below goal.   Goal of Therapy:  Heparin level 0.3-0.7 units/ml; aPTT 66-102 sec Monitor platelets by anticoagulation protocol: Yes   Plan:  Increase heparin to 700 units/h Recheck heparin level and aPTT with am labs  Arrie Senate, PharmD, BCPS, [facility] Clinical Pharmacist [phone] Please check AMION for all [place] numbers 05/20/2021

## 2021-05-20 NOTE — Progress Notes (Addendum)
Initial Nutrition Assessment  DOCUMENTATION CODES:  Severe malnutrition in context of chronic illness  INTERVENTION:  Add Vital Cuisine Shake TID, each supplement provides 520 kcal and 22 grams of protein.  Add Magic cup TID with meals, each supplement provides 290 kcal and 9 grams of protein - addendum: discontinue this order due to back order  Discontinue MVI with minerals daily.  Add Renal MVI daily and for discharge.  Recommend scheduling anti-emetic medication around mealtimes.  Encourage PO and supplement intake.  NUTRITION DIAGNOSIS:  Severe Malnutrition related to chronic illness (CKD now requiring HD) as evidenced by severe muscle depletion, percent weight loss.  GOAL:  Patient will meet greater than or equal to 90% of their needs  MONITOR:  PO intake, Supplement acceptance, Labs, Weight trends, I & O's  REASON FOR ASSESSMENT:  LOS, Diagnosis (New HD)    ASSESSMENT:  76 yo male with a PMH of HTN, HLD, arthritis, EtOH abuse, tobacco abuse, and CKD stage 3b who presents with acute MI and new start on HD. 1/3 - TEE without cardioversion 1/5 - R/L heart cath and coronary angio 1/9 - TEE without cardioversion; cardioversion  Plan for HD catheter placement tomorrow. Pt to be NPO at midnight tonight.  Per Epic, pt with varied meal intakes. Pt taking an average of ~39% (10-100%) over the past 8 meals. However, meal documentation is limited.  Spoke with pt and family at bedside. Family reports that pt has pretty constant nausea and he has emesis every time he eats.  He has not asked for anti-nausea medication with meals. RD to recommend this to MD via secure chat.  Also spoke with pt and family about feeding tube if PO intake remains poor - family and pt would like to continue a food first approach but are not opposed to a short-term feeding tube if there are no other options.  Family also reports that pt has lost ~10 lbs in the past 3 weeks that he has been admitted.  Per Epic, pt has lost ~19 lbs (10.4%) in the past 4 months, which is significant and severe for the time frame.  RD to replace MVI with minerals with Rena-Vite given patient is a new start on HD. Of note, please discharge patient on a renal MVI.  Of note, pt with mild generalized edema.  Pt meets severe malnutrition criteria given significant weight loss and severe muscle depletion.  Given pt's lab values WNL and poor PO, RD to order H[employer]el shakes TID and Magic Cup TID.  UOP: 10 ml/24 hours   Intake/Output Summary (Last 24 hours) at 05/20/2021 1346 Last data filed at 05/20/2021 0600 Gross per 24 hour  Intake 470.26 ml  Output 10 ml  Net 460.26 ml    Medications: reviewed; folic acid, midodrine TID, MVI with minerals daily, Senokot BID, thiamine, heparin infusion per IV  Labs: reviewed; CBG 105-128 (H) HbA1c: 5.7% (04/30/2021)  NUTRITION - FOCUSED PHYSICAL EXAM: Flowsheet Row Most Recent Value  Orbital Region Moderate depletion  Upper Arm Region Moderate depletion  Thoracic and Lumbar Region Mild depletion  Buccal Region Moderate depletion  Temple Region Moderate depletion  Clavicle Bone Region Severe depletion  Clavicle and Acromion Bone Region Severe depletion  Scapular Bone Region Unable to assess  Dorsal Hand No depletion  Patellar Region Severe depletion  Anterior Thigh Region Severe depletion  Posterior Calf Region Severe depletion  Edema (RD Assessment) Mild  Hair Reviewed  Eyes Reviewed  Mouth Reviewed  Skin Reviewed  Nails Reviewed  Diet Order:   Diet Order             Diet NPO time specified Except for: Sips with Meds  Diet effective midnight           Diet renal with fluid restriction Room service appropriate? Yes; Fluid consistency: Thin  Diet effective now                  EDUCATION NEEDS:  Education needs have been addressed  Skin:  Skin Assessment: Reviewed RN Assessment  Last BM:  05/19/21  Height:  Ht Readings from Last 1 Encounters:   05/13/21 5\' 11"  (1.803 m)   Weight:  Wt Readings from Last 1 Encounters:  05/20/21 73.3 kg   BMI:  Body mass index is 22.54 kg/m.  Estimated Nutritional Needs:  Kcal:  2100-2300 Protein:  95-110 grams Fluid:  1000 mL + UOP  Derrel Nip, RD, LDN (she/her/hers) Clinical Inpatient Dietitian RD Pager/After-Hours/Weekend Pager # in [place]

## 2021-05-20 NOTE — Progress Notes (Addendum)
[place] for Eliquis > Heparin Indication: atrial fibrillation  Allergies  Allergen Reactions   Other Other (See Comments)    Pollen - unknown    Patient Measurements: Height: 5\' 11"  (180.3 cm) Weight: 73.4 kg (161 lb 13.1 oz) IBW/kg (Calculated) : 75.3 Heparin Dosing Weight: 78 kg  Vital Signs: Temp: 98.1 F (36.7 C) (01/16 0600) Temp Source: Oral (01/16 0600) BP: 104/61 (01/16 0600) Pulse Rate: 74 (01/16 0600)  Labs: Recent Labs    05/17/21 1639 05/17/21 1639 05/18/21 0339 05/18/21 1421 05/19/21 0523 05/19/21 1616 05/20/21 0400  HGB  --    < > 11.5*  --  12.7*  --  12.5*  HCT  --   --  33.7*  --  37.3*  --  36.7*  PLT  --   --  137*  --  147*  --  140*  APTT 152*  --  >200*   < > 51* 61* 44*  HEPARINUNFRC  --   --  >1.10*  --  >1.10*  --  >1.10*  CREATININE 2.71*  --  3.75*  --  3.76*  --   --    < > = values in this interval not displayed.     Estimated Creatinine Clearance: 17.6 mL/min (A) (by C-G formula based on SCr of 3.76 mg/dL (H)).   Assessment: 76 yo male on chronic Eliquis for afib, now initiated on CRRT and pharmacy asked to transition Eliquis to IV heparin.   Recent DCCV 1/9 > back into AF today.  Last dose of Eliquis given 1/11 at 2200 pm. Heparin level affected by Eliquis so utilizing aPTT for monitoring until levels correlate.  Heparin level is high as expected, aPTT this morning trended down to 44, on heparin 550 units/hr. Hgb 12.5, plt 140. No s/sx of bleeding or infusion issues.   Goal of Therapy:  Heparin level 0.3-0.7 units/ml; aPTT 66-102 sec Monitor platelets by anticoagulation protocol: Yes   Plan:  Increase heparin to 650 units/hr Order heparin levels in 8 hr Monitor daily HL/aPTT, CBC, and for s/sx of bleeding   Antonietta Jewel, PharmD, [place] Pharmacist  Phone: [phone] 05/20/2021 7:29 AM  Please check AMION for all [place] phone numbers After 10:00 PM, call [place]  [phone]

## 2021-05-20 NOTE — Progress Notes (Signed)
[name] Progress Note    Assessment/ Plan:    AKI on CKD 3b:normally sees Dr Posey Pronto.  On ARB as OP.  He has had both significant hyper- and hypotensive episodes during this hospitalization requiring treatment and a contrast load on 05/09/21.  Renal US without obstruction and R 6 mm renal calculus.  UA on admission bland. - repeat UA bland again 05/14/21 - AKI component suspected ATN + CIN combination + cardiorenal sydrome - CRRT 05/15/21-05/17/21 - some increased UOP with IV Lasix 1/14-- 550 mL but none yesterday - on midodrine 10 TID - HD 05/18/21 with 2.4L UF - next planned for 05/20/21, Goal 2-2.5L as tolerated -Need to work out access plan --> for now will request IR TDC placement (nonurgent, temp line working fine).  Vein map.  Discussed long term AVF/G with him today, will plan to consult vascular in the coming days pending course.     2.  Acute on chronic systolic CHF: EF 57-01%, previously on inotropes, now on midodrine and off  dobutamine.  CHF team plans to send to step down today.   3.  CAD - LHC 05/09/21, 3V disease - not thought to be a candidate for surgery per CVTS   4.  Late-presenting MI - trops peaked > 20,000   5.  Afib: - s/p DCCV 05/13/21 - PO amiodarone - Eliquis --> hep gtt   6.  MR: thought to be infarct related   7.  Hyponatremia - improving and mild  8.  Noted tobacco and EtOH use PTA   9.  Dispo: inpatient  Subjective:    HD planned for today.  C/o ongoing nausea.   No response to IV lasix yesterday.  Co-ox 67% this AM off dobutamine.  In for stepdown.   Objective:   BP 104/61    Pulse 74    Temp 98.1 F (36.7 C)    Resp (!) 25    Ht 5\' 11"  (1.803 m)    Wt 73.4 kg    SpO2 94%    BMI 22.57 kg/m   Intake/Output Summary (Last 24 hours) at 05/20/2021 0806 Last data filed at 05/20/2021 0600 Gross per 24 hour  Intake 1056.52 ml  Output 10 ml  Net 1046.52 ml    Weight change: 0.9 kg  Physical Exam: GEN lying flat in bed, appears  relatively comfortable HEENT no jaundice PULM clear CV RRR ABD soft EXT edema resolved ACCESS: L IJ HD cath c/d/i  Imaging: No results found.  Labs: BMET Recent Labs  Lab 05/15/21 1628 05/16/21 0407 05/16/21 1621 05/17/21 0310 05/17/21 1639 05/18/21 0339 05/19/21 0523  NA 126* 131* 134* 133* 137 133* 135  K 4.2 4.0 4.3 4.3 4.8 4.6 4.2  CL 92* 96* 99 99 101 99 96*  CO2 22 22 25 25 25 25 26   GLUCOSE 102* 137* 138* 140* 146* 179* 109*  BUN 49* 28* 19 12 9 14 10   CREATININE 7.26* 4.32* 3.33* 2.89* 2.71* 3.75* 3.76*  CALCIUM 8.5* 8.1* 8.0* 8.0* 8.6* 8.3* 8.9  PHOS 6.1* 3.5 2.2* 1.9* 1.6* 1.9* 2.2*    CBC Recent Labs  Lab 05/16/21 0407 05/18/21 0339 05/19/21 0523 05/20/21 0400  WBC 5.5 6.3 6.2 6.6  HGB 12.8* 11.5* 12.7* 12.5*  HCT 37.2* 33.7* 37.3* 36.7*  MCV 93.5 94.9 94.2 94.6  PLT 147* 137* 147* 140*     Medications:     amiodarone  200 mg Oral BID   atorvastatin  80 mg Oral Daily  Chlorhexidine Gluconate Cloth  6 each Topical L2174   folic acid  1 mg Oral Daily   mouth rinse  15 mL Mouth Rinse BID   midodrine  10 mg Oral TID WC   multivitamin with minerals  1 tablet Oral Daily   senna-docusate  1 tablet Oral BID   sodium chloride flush  10-40 mL Intracatheter Q12H   sodium chloride flush  3 mL Intravenous Q12H   thiamine  100 mg Oral Daily    Jannifer Hick MD [facility] Pager [phone]

## 2021-05-21 ENCOUNTER — Inpatient Hospital Stay (HOSPITAL_COMMUNITY): Payer: Medicare Other

## 2021-05-21 DIAGNOSIS — N179 Acute kidney failure, unspecified: Secondary | ICD-10-CM

## 2021-05-21 HISTORY — PX: IR FLUORO GUIDE CV LINE RIGHT: IMG2283

## 2021-05-21 HISTORY — PX: IR PERC TUN PERIT CATH WO PORT S&I /IMAG: IMG2327

## 2021-05-21 HISTORY — PX: IR US GUIDE VASC ACCESS RIGHT: IMG2390

## 2021-05-21 LAB — RENAL FUNCTION PANEL
Albumin: 3.5 g/dL (ref 3.5–5.0)
Anion gap: 11 (ref 5–15)
BUN: 19 mg/dL (ref 8–23)
CO2: 24 mmol/L (ref 22–32)
Calcium: 9.2 mg/dL (ref 8.9–10.3)
Chloride: 96 mmol/L — ABNORMAL LOW (ref 98–111)
Creatinine, Ser: 6.31 mg/dL — ABNORMAL HIGH (ref 0.61–1.24)
GFR, Estimated: 9 mL/min — ABNORMAL LOW (ref >60)
Glucose, Bld: 93 mg/dL (ref 70–99)
Phosphorus: 3.3 mg/dL (ref 2.5–4.6)
Potassium: 4.3 mmol/L (ref 3.5–5.1)
Sodium: 131 mmol/L — ABNORMAL LOW (ref 135–145)

## 2021-05-21 LAB — GLUCOSE, CAPILLARY
Glucose-Capillary: 101 mg/dL — ABNORMAL HIGH (ref 70–99)
Glucose-Capillary: 91 mg/dL (ref 70–99)
Glucose-Capillary: 100 mg/dL — ABNORMAL HIGH (ref 70–99)
Glucose-Capillary: 133 mg/dL — ABNORMAL HIGH (ref 70–99)
Glucose-Capillary: 127 mg/dL — ABNORMAL HIGH (ref 70–99)

## 2021-05-21 LAB — CBC
HCT: 38.5 % — ABNORMAL LOW (ref 39.0–52.0)
Hemoglobin: 13.3 g/dL (ref 13.0–17.0)
MCH: 32.3 pg (ref 26.0–34.0)
MCHC: 34.5 g/dL (ref 30.0–36.0)
MCV: 93.4 fL (ref 80.0–100.0)
Platelets: 145 10*3/uL — ABNORMAL LOW (ref 150–400)
RBC: 4.12 MIL/uL — ABNORMAL LOW (ref 4.22–5.81)
RDW: 13.5 % (ref 11.5–15.5)
WBC: 7 10*3/uL (ref 4.0–10.5)
nRBC: 0 % (ref 0.0–0.2)

## 2021-05-21 LAB — COOXEMETRY PANEL
Carboxyhemoglobin: 0.9 % (ref 0.5–1.5)
Methemoglobin: 0.8 % (ref 0.0–1.5)
O2 Saturation: 62.5 %
Total hemoglobin: 13.8 g/dL (ref 12.0–16.0)

## 2021-05-21 LAB — APTT: aPTT: 72 s — ABNORMAL HIGH (ref 24–36)

## 2021-05-21 LAB — HEPARIN LEVEL (UNFRACTIONATED): Heparin Unfractionated: 1.09 IU/mL — ABNORMAL HIGH (ref 0.30–0.70)

## 2021-05-21 MED ORDER — HEPARIN SODIUM (PORCINE) 1000 UNIT/ML IJ SOLN
INTRAMUSCULAR | Status: AC | PRN
  Administered 2021-05-21: 3000 [IU] via INTRAVENOUS

## 2021-05-21 MED ORDER — HEPARIN SODIUM (PORCINE) 1000 UNIT/ML IJ SOLN
INTRAMUSCULAR | Status: AC
  Filled 2021-05-21: qty 10

## 2021-05-21 MED ORDER — LIDOCAINE HCL 1 % IJ SOLN
INTRAMUSCULAR | Status: AC
  Filled 2021-05-21: qty 20

## 2021-05-21 MED ORDER — HEPARIN (PORCINE) 25000 UT/250ML-% IV SOLN
700.0000 [IU]/h | INTRAVENOUS | Status: DC
  Administered 2021-05-21 – 2021-05-25 (×3): 700 [IU]/h via INTRAVENOUS
  Filled 2021-05-21 (×4): qty 250

## 2021-05-21 MED ORDER — MIDAZOLAM HCL 2 MG/2ML IJ SOLN
INTRAMUSCULAR | Status: AC | PRN
  Administered 2021-05-21: .5 mg via INTRAVENOUS
  Administered 2021-05-21: 1 mg via INTRAVENOUS

## 2021-05-21 MED ORDER — CEFAZOLIN SODIUM-DEXTROSE 2-4 GM/100ML-% IV SOLN
INTRAVENOUS | Status: AC
  Administered 2021-05-21: 2 g via INTRAVENOUS
  Filled 2021-05-21: qty 100

## 2021-05-21 MED ORDER — LIDOCAINE HCL 1 % IJ SOLN
INTRAMUSCULAR | Status: AC | PRN
  Administered 2021-05-21: 20 mL

## 2021-05-21 MED ORDER — MIDAZOLAM HCL 2 MG/2ML IJ SOLN
INTRAMUSCULAR | Status: AC
  Filled 2021-05-21: qty 2

## 2021-05-21 MED ORDER — FENTANYL CITRATE (PF) 100 MCG/2ML IJ SOLN
INTRAMUSCULAR | Status: AC
  Filled 2021-05-21: qty 2

## 2021-05-21 MED ORDER — FENTANYL CITRATE (PF) 100 MCG/2ML IJ SOLN
INTRAMUSCULAR | Status: AC | PRN
  Administered 2021-05-21 (×2): 25 ug via INTRAVENOUS

## 2021-05-21 MED ORDER — GELATIN ABSORBABLE 12-7 MM EX MISC
CUTANEOUS | Status: AC
  Filled 2021-05-21: qty 1

## 2021-05-21 MED ORDER — CHLORHEXIDINE GLUCONATE 4 % EX LIQD
CUTANEOUS | Status: AC
  Filled 2021-05-21: qty 15

## 2021-05-21 NOTE — Progress Notes (Signed)
Upper extremity vein mapping has been completed.   Preliminary results in CV Proc.   [name] 05/21/2021 2:50 PM

## 2021-05-21 NOTE — Progress Notes (Addendum)
[place] for Eliquis > Heparin Indication: atrial fibrillation  Allergies  Allergen Reactions   Other Other (See Comments)    Pollen - unknown    Patient Measurements: Height: 5\' 11"  (180.3 cm) Weight: 72.4 kg (159 lb 9.8 oz) IBW/kg (Calculated) : 75.3 Heparin Dosing Weight: 78 kg  Vital Signs: Temp: 98.6 F (37 C) (01/17 0700) Temp Source: Oral (01/17 0700) BP: 116/60 (01/17 0800) Pulse Rate: 61 (01/17 0800)  Labs: Recent Labs    05/19/21 0523 05/19/21 1616 05/20/21 0400 05/20/21 1600 05/21/21 0449  HGB 12.7*  --  12.5*  --  13.3  HCT 37.3*  --  36.7*  --  38.5*  PLT 147*  --  140*  --  145*  APTT 51*   < > 44* 64* 72*  HEPARINUNFRC >1.10*  --  >1.10*  --  1.09*  CREATININE 3.76*  --   --   --  6.31*   < > = values in this interval not displayed.     Estimated Creatinine Clearance: 10.4 mL/min (A) (by C-G formula based on SCr of 6.31 mg/dL (H)).   Assessment: 76 yo male on chronic Eliquis for afib, now initiated on CRRT and pharmacy asked to transition Eliquis to IV heparin.     Recent DCCV 1/9 but went back into AF. Of note, last dose of Eliquis given 1/11 at 2200 pm. Heparin level affected by Eliquis so utilizing aPTT for monitoring until levels correlate.  Heparin level came back elevated at 1.09, aPTT came back therapeutic at 72, on 700 units/hr. No s/sx of bleeding or infusion issues.   Goal of Therapy:  Heparin level 0.3-0.7 units/ml; aPTT 66-102 sec Monitor platelets by anticoagulation protocol: Yes   Plan:  Continue heparin to 700 units/hr - now off for IR procedure, will follow up after for restart   Antonietta Jewel, PharmD, [place] Pharmacist  Phone: [phone] 05/21/2021 8:33 AM  Please check AMION for all [place] phone numbers After 10:00 PM, call [place] [phone]  ADDENDUM S/p new dialysis catheter 1/17- confirmed with IR okay to restart heparin. Will restart at previously therapeutic  rate of 700 units/hr and get level with morning labs.  Antonietta Jewel, PharmD, [place] Clinical Pharmacist

## 2021-05-21 NOTE — Plan of Care (Signed)
°  Problem: Education: Goal: Knowledge of General Education information will improve Description: Including pain rating scale, medication(s)/side effects and non-pharmacologic comfort measures Outcome: Progressing   Problem: Health Behavior/Discharge Planning: Goal: Ability to manage health-related needs will improve Outcome: Progressing   Problem: Clinical Measurements: Goal: Ability to maintain clinical measurements within normal limits will improve Outcome: Progressing Goal: Diagnostic test results will improve Outcome: Progressing Goal: Respiratory complications will improve Outcome: Progressing Goal: Cardiovascular complication will be avoided Outcome: Progressing   Problem: Activity: Goal: Risk for activity intolerance will decrease Outcome: Progressing   Problem: Nutrition: Goal: Adequate nutrition will be maintained Outcome: Progressing   Problem: Skin Integrity: Goal: Risk for impaired skin integrity will decrease Outcome: Progressing

## 2021-05-21 NOTE — Plan of Care (Signed)

## 2021-05-21 NOTE — Procedures (Signed)
Interventional Radiology Procedure:   Indications: Renal failure  Procedure: Tunneled dialysis catheter placement  Findings: Right jugular tunneled Palindrome catheter, tip at SVC/RA junction.  Length = 23 cm tip to cuff.  Removed left jugular dialysis catheter at end of procedure.  Complications: No immediate complications noted.     EBL: Minimal  Plan: New dialysis catheter is ready to use.     Cherryl Babin R. Anselm Pancoast, MD  Pager: [phone]

## 2021-05-21 NOTE — Progress Notes (Signed)
Patient ID: Ruben [name], male   DOB: 04-02-1946, 76 y.o.   MRN: [id number]     Advanced Heart Failure Rounding Note  PCP-Cardiologist: Sanda Klein, MD   Subjective:   12/26: Admit with late presentation MI. New AF 12/28: CGS initial co-ox 47%. Started NE + mil. 01/03: SR 05/08/20 Back in A fib and milrinone was cut back to 0.125 mcg.  05/09/20 Milrinone increased back to 0.25. Volume overload, elevated filling pressures on RHC with preserved CO on milrinone. Hypertensive. Back in ICU on nitro gtt.  LHC 1/5: occluded RCA (culprit), diffuse disease in LAD and Lcx. Seen by CVTS. Not felt to be a candidate for surgery. 1/9: S/P TEE followed by DC/CV -->SR.  1/11: Afib w/ RVR>>amio gtt started 1/11: Moved to ICU for CVVH  1/13: Stopped CVVH 1/14: iHD 1/15: DBA stopped. Given IV lasix with 10 cc urine output.  1/16: iHD  Co-ox 63% off dobutamine. CVP 4.   No complaints this morning. Tolerated iHD yesterday.    Objective:   Weight Range: 72.4 kg Body mass index is 22.26 kg/m.   Vital Signs:   Temp:  [97.9 F (36.6 C)-98.8 F (37.1 C)] 98.6 F (37 C) (01/17 0700) Pulse Rate:  [58-98] 58 (01/17 0530) Resp:  [11-30] 20 (01/17 0530) BP: (84-116)/(41-80) 100/54 (01/17 0530) SpO2:  [90 %-96 %] 94 % (01/17 0530) Weight:  [72.4 kg-73.3 kg] 72.4 kg (01/16 1500) Last BM Date: 05/20/21  Weight change: Filed Weights   05/20/21 0500 05/20/21 1142 05/20/21 1500  Weight: 73.4 kg 73.3 kg 72.4 kg    Intake/Output:   Intake/Output Summary (Last 24 hours) at 05/21/2021 0805 Last data filed at 05/21/2021 0500 Gross per 24 hour  Intake 633.65 ml  Output 1010 ml  Net -376.35 ml      Physical Exam  General: NAD Neck: No JVD, no thyromegaly or thyroid nodule.  Lungs: Clear to auscultation bilaterally with normal respiratory effort. CV: Nondisplaced PMI.  Heart regular S1/S2, no S3/S4, 2/6 HSM apex.  No peripheral edema.   Abdomen: Soft, nontender, no hepatosplenomegaly, no  distention.  Skin: Intact without lesions or rashes.  Neurologic: Alert and oriented x 3.  Psych: Normal affect. Extremities: No clubbing or cyanosis.  HEENT: Normal.   Telemetry   NSR 60-70s (personally reviewed)  Labs    CBC Recent Labs    05/20/21 0400 05/21/21 0449  WBC 6.6 7.0  HGB 12.5* 13.3  HCT 36.7* 38.5*  MCV 94.6 93.4  PLT 140* 387*   Basic Metabolic Panel Recent Labs    05/19/21 0523 05/21/21 0449  NA 135 131*  K 4.2 4.3  CL 96* 96*  CO2 26 24  GLUCOSE 109* 93  BUN 10 19  CREATININE 3.76* 6.31*  CALCIUM 8.9 9.2  MG 2.0  --   PHOS 2.2* 3.3   Liver Function Tests Recent Labs    05/19/21 0523 05/21/21 0449  ALBUMIN 3.3* 3.5    No results for input(s): LIPASE, AMYLASE in the last 72 hours. Cardiac Enzymes No results for input(s): CKTOTAL, CKMB, CKMBINDEX, TROPONINI in the last 72 hours.  BNP: BNP (last 3 results) Recent Labs    04/29/21 1251  BNP 1,659.6*    ProBNP (last 3 results) No results for input(s): PROBNP in the last 8760 hours.   D-Dimer No results for input(s): DDIMER in the last 72 hours. Hemoglobin A1C No results for input(s): HGBA1C in the last 72 hours. Fasting Lipid Panel No results for input(s): CHOL, HDL,  LDLCALC, TRIG, CHOLHDL, LDLDIRECT in the last 72 hours. Thyroid Function Tests No results for input(s): TSH, T4TOTAL, T3FREE, THYROIDAB in the last 72 hours.  Invalid input(s): FREET3  Other results:   Imaging    No results found.   Medications:     Scheduled Medications:  amiodarone  200 mg Oral BID   atorvastatin  80 mg Oral Daily   Chlorhexidine Gluconate Cloth  6 each Topical T4196   folic acid  1 mg Oral Daily   mouth rinse  15 mL Mouth Rinse BID   midodrine  10 mg Oral TID WC   multivitamin  1 tablet Oral QHS   ondansetron  4 mg Oral TID WC   senna-docusate  1 tablet Oral BID   sodium chloride flush  10-40 mL Intracatheter Q12H   sodium chloride flush  3 mL Intravenous Q12H   thiamine   100 mg Oral Daily    Infusions:  sodium chloride     sodium chloride     sodium chloride      ceFAZolin (ANCEF) IV     heparin 700 Units/hr (05/21/21 0500)    PRN Medications: sodium chloride, sodium chloride, sodium chloride, acetaminophen, ALPRAZolam, alteplase, benzonatate, heparin, lidocaine (PF), lidocaine-prilocaine, ondansetron (ZOFRAN) IV, pentafluoroprop-tetrafluoroeth, pentafluoroprop-tetrafluoroeth, polyethylene glycol, sodium chloride flush, sodium chloride flush    Assessment/Plan   1. CAD: Suspect delayed presentation LCx (versus RCA) MI.  Echo with EF 35-40%, inferolateral and basal inferior akinesis, moderate-severe eccentric likely infarct-related MR, normal RV.  He was not cathed initially due to AKI and delayed presentation. Eventual LHC with occluded RCA (culprit), diffuse moderate disease in LAD and Lcx. No good PCI targets. Not a candidate for CABG. No chest pain.  - Continue statin - Anticoagulated.  2. Acute systolic CHF/cardiogenic shock: Ischemic cardiomyopathy with infarct-related MR. Echo with EF 35-40%, inferolateral and basal inferior akinesis, moderate-severe eccentric likely infarct-related MR, normal RV.  Echo does not look bad enough to explain low output HF, concerned that MR may be playing a role.  TEE on 01/09 with EF 35-40%, moderate MR (suspect infarct-related), aortic dilatation measuring 44 mm, moderate AI. RHC on 0.25 milrinone showed RA mean 16, PA 66/26 (41), PCWP 27, PA 62%, Fick CO 5.04/ CI 2.56.  He was weaned off pressors but restarted dobutamine with persistently low co-ox.   CVVH stopped on 1/13 but started iHD. At this point, looks like he will need iHD ongoing. CVP 4, co-ox 63%.  - CO-OX stable off dobutamine.   - Continue midodrine 10 tid.  - iHD for volume management, has been tolerating.  3. Hyponatremia: Managed via HD.  4. Valvular heart disease:  MR moderate in severity on TEE. Likely infarct related. Moderate AI.  5. Atrial  fibrillation: First noted this admission.  S/P TEE followed DC-CV--> NSR.  In and out of AF, currently NSR.  Hopefully will hold NSR better off dobutamine.  - Continue amiodarone 200 mg twice a day.  - Heparin gtt until access finalized then Eliquis. .  6. AKI on CKD 3: Baseline creatinine around 1.4 in 6/22. Peak at 3.09 and trended down, now 2.2 -> 2.6 -> 3.1 > 4.0. > 5.3 > 6.58 >7.26 > CVVH started. 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 Renal US with no hydronephrosis. CVVH started 1/11 and stopped 1/13.  However, minimal UOP and creatinine rose so started iHD on 1/14.  Suspect he will need iHD for now.   - Tunneled catheter today.  7. Active smoker: Needs to quit.  8.  ETOH abuse: Needs to cut back.  9. Thrombocytopenia: Mild, pre-dates heparin initiation.  10. Hyperglycemia: - Continue SSI  - A1c 5.7  To step down today.    Loralie Champagne 05/21/2021 8:05 AM

## 2021-05-21 NOTE — Progress Notes (Signed)
Mobility Specialist Progress Note    05/21/21 1730  Mobility  Activity Ambulated independently in hallway  Level of Assistance Standby assist, set-up cues, supervision of patient - no hands on  Assistive Device None  Distance Ambulated (ft) 410 ft  Activity Response Tolerated well  $Mobility charge 1 Mobility   Pt received in bed and agreeable. No complaints on walk. Returned to bed with call bell in reach.   [facility] Mobility Specialist  M.S. 2C and 6E: [phone] M.S. 4E: [phone]

## 2021-05-21 NOTE — Progress Notes (Signed)
[facility] Progress Note    Assessment/ Plan:    AKI on CKD 3b:normally sees Dr Posey Pronto.  On ARB as OP.  He has had both significant hyper- and hypotensive episodes during this hospitalization requiring treatment and a contrast load on 05/09/21.  Renal US without obstruction and R 6 mm renal calculus.  UA on admission bland.AKI component suspected ATN + CIN combination + cardiorenal sydrome - CRRT 05/15/21-05/17/21 now tolerating iHD with midodrine 10 TID support.  Last HC 1/16 1L UF - anuric recently, not responding to lasix so d/c'd med -Need to work out access plan --> for now had [place] placed 1/17.  Vein map.  Discussed long term AVF/G with him yesterday.    2.  Acute on chronic systolic CHF: EF 31-54%, previously on inotropes, now on midodrine and off  dobutamine.  CHF team plans to send to step down today.   3.  CAD - LHC 05/09/21, 3V disease - not thought to be a candidate for surgery per CVTS   4.  Late-presenting MI - trops peaked > 20,000   5.  Afib: - s/p DCCV 05/13/21 - PO amiodarone - Eliquis --> hep gtt   6.  MR: thought to be infarct related   7.  Hyponatremia - improving and mild  8.  Noted tobacco and EtOH use PTA   9.  Dispo: inpatient but making progress towards disposition.    Subjective:    HD 1L yesterday. CVP this AM 4 S/p TDC placement in IR this AM.  In for stepdown.   Objective:   BP 107/61    Pulse 60    Temp 98.6 F (37 C) (Oral)    Resp 11    Ht 5\' 11"  (1.803 m)    Wt 72.4 kg    SpO2 96%    BMI 22.26 kg/m   Intake/Output Summary (Last 24 hours) at 05/21/2021 1051 Last data filed at 05/21/2021 0800 Gross per 24 hour  Intake 650.5 ml  Output 1010 ml  Net -359.5 ml    Weight change: -0.1 kg  Physical Exam: GEN lying flat in bed, appears relatively comfortable HEENT no jaundice PULM clear CV RRR ABD soft EXT edema resolved ACCESS: L IJ HD cath c/d/i  Imaging: No results found.  Labs: BMET Recent Labs  Lab  05/16/21 0407 05/16/21 1621 05/17/21 0310 05/17/21 1639 05/18/21 0339 05/19/21 0523 05/21/21 0449  NA 131* 134* 133* 137 133* 135 131*  K 4.0 4.3 4.3 4.8 4.6 4.2 4.3  CL 96* 99 99 101 99 96* 96*  CO2 22 25 25 25 25 26 24   GLUCOSE 137* 138* 140* 146* 179* 109* 93  BUN 28* 19 12 9 14 10 19   CREATININE 4.32* 3.33* 2.89* 2.71* 3.75* 3.76* 6.31*  CALCIUM 8.1* 8.0* 8.0* 8.6* 8.3* 8.9 9.2  PHOS 3.5 2.2* 1.9* 1.6* 1.9* 2.2* 3.3    CBC Recent Labs  Lab 05/18/21 0339 05/19/21 0523 05/20/21 0400 05/21/21 0449  WBC 6.3 6.2 6.6 7.0  HGB 11.5* 12.7* 12.5* 13.3  HCT 33.7* 37.3* 36.7* 38.5*  MCV 94.9 94.2 94.6 93.4  PLT 137* 147* 140* 145*     Medications:     amiodarone  200 mg Oral BID   atorvastatin  80 mg Oral Daily   chlorhexidine       Chlorhexidine Gluconate Cloth  6 each Topical M0867   folic acid  1 mg Oral Daily   gelatin adsorbable       heparin  sodium (porcine)       lidocaine       mouth rinse  15 mL Mouth Rinse BID   midodrine  10 mg Oral TID WC   multivitamin  1 tablet Oral QHS   ondansetron  4 mg Oral TID WC   senna-docusate  1 tablet Oral BID   sodium chloride flush  10-40 mL Intracatheter Q12H   sodium chloride flush  3 mL Intravenous Q12H   thiamine  100 mg Oral Daily    Jannifer Hick MD [facility] Pager [phone]

## 2021-05-21 NOTE — Progress Notes (Signed)
CARDIAC REHAB PHASE I   PRE:  Rate/Rhythm: 60 SR    BP: sitting 113/58    SaO2: 96 RA  MODE:  Ambulation: 640 ft   POST:  Rate/Rhythm: 80 SR    BP: sitting 129/75     SaO2: 96 RA  Ambulated with standby assist at normal pace. No c/o, feels well. VSS. Return to EOB for lunch. [place], ACSM 05/21/2021 1:41 PM

## 2021-05-22 ENCOUNTER — Encounter (HOSPITAL_COMMUNITY): Payer: Self-pay

## 2021-05-22 LAB — GLUCOSE, CAPILLARY
Glucose-Capillary: 110 mg/dL — ABNORMAL HIGH (ref 70–99)
Glucose-Capillary: 123 mg/dL — ABNORMAL HIGH (ref 70–99)
Glucose-Capillary: 122 mg/dL — ABNORMAL HIGH (ref 70–99)

## 2021-05-22 LAB — CBC
HCT: 36.7 % — ABNORMAL LOW (ref 39.0–52.0)
Hemoglobin: 12.6 g/dL — ABNORMAL LOW (ref 13.0–17.0)
MCH: 32 pg (ref 26.0–34.0)
MCHC: 34.3 g/dL (ref 30.0–36.0)
MCV: 93.1 fL (ref 80.0–100.0)
Platelets: 140 10*3/uL — ABNORMAL LOW (ref 150–400)
RBC: 3.94 MIL/uL — ABNORMAL LOW (ref 4.22–5.81)
RDW: 13.5 % (ref 11.5–15.5)
WBC: 7.3 10*3/uL (ref 4.0–10.5)
nRBC: 0 % (ref 0.0–0.2)

## 2021-05-22 LAB — RENAL FUNCTION PANEL
Albumin: 3.2 g/dL — ABNORMAL LOW (ref 3.5–5.0)
Anion gap: 11 (ref 5–15)
BUN: 37 mg/dL — ABNORMAL HIGH (ref 8–23)
CO2: 22 mmol/L (ref 22–32)
Calcium: 8.8 mg/dL — ABNORMAL LOW (ref 8.9–10.3)
Chloride: 99 mmol/L (ref 98–111)
Creatinine, Ser: 8.25 mg/dL — ABNORMAL HIGH (ref 0.61–1.24)
GFR, Estimated: 6 mL/min — ABNORMAL LOW (ref >60)
Glucose, Bld: 102 mg/dL — ABNORMAL HIGH (ref 70–99)
Phosphorus: 4.4 mg/dL (ref 2.5–4.6)
Potassium: 4.2 mmol/L (ref 3.5–5.1)
Sodium: 132 mmol/L — ABNORMAL LOW (ref 135–145)

## 2021-05-22 LAB — COOXEMETRY PANEL
Carboxyhemoglobin: 1.1 % (ref 0.5–1.5)
Methemoglobin: 0.8 % (ref 0.0–1.5)
O2 Saturation: 62.3 %
Total hemoglobin: 13.4 g/dL (ref 12.0–16.0)

## 2021-05-22 LAB — APTT: aPTT: 78 seconds — ABNORMAL HIGH (ref 24–36)

## 2021-05-22 LAB — HEPARIN LEVEL (UNFRACTIONATED): Heparin Unfractionated: 0.79 IU/mL — ABNORMAL HIGH (ref 0.30–0.70)

## 2021-05-22 NOTE — Progress Notes (Signed)
[facility] Progress Note    Assessment/ Plan:    AKI on CKD 3b:normally sees Dr Posey Pronto.  On ARB as OP.  He has had both significant hyper- and hypotensive episodes during this hospitalization requiring treatment and a contrast load on 05/09/21.  Renal US without obstruction and R 6 mm renal calculus.  UA on admission bland.AKI component suspected ATN + CIN combination + cardiorenal sydrome - CRRT 05/15/21-05/17/21 now tolerating iHD with midodrine 10 TID support.  Last HD 1/16 1L UF.  Next HD today . - he's had intermittent UOP - some days none, yesterday 450mL.  -Had [place] placed 1/17.  Vein map.   Will monitor for renal recovery in the coming days, but I suspect he'll be ESRD and need a perm access.     2.  Acute on chronic systolic CHF: EF 25-95%, previously on inotropes, now on midodrine and off  dobutamine.  CHF managing.    3.  CAD - LHC 05/09/21, 3V disease - not thought to be a candidate for surgery per CVTS   4.  Late-presenting MI - trops peaked > 20,000   5.  Afib: - s/p DCCV 05/13/21 - PO amiodarone - Eliquis --> hep gtt for now in case procedures needed   6.  MR: thought to be infarct related   7.  Hyponatremia - improving and mild  8.  Noted tobacco and EtOH use PTA   9.  Dispo: inpatient but making progress towards disposition.    Subjective:    Now in stepdown.  Cardiac rehab underway.      Objective:   BP 114/65 (BP Location: Left Arm)    Pulse 73    Temp 98.1 F (36.7 C) (Oral)    Resp 20    Ht 5\' 11"  (1.803 m)    Wt 71.7 kg    SpO2 96%    BMI 22.05 kg/m   Intake/Output Summary (Last 24 hours) at 05/22/2021 1059 Last data filed at 05/22/2021 1001 Gross per 24 hour  Intake 343.55 ml  Output 450 ml  Net -106.45 ml    Weight change:   Physical Exam: GEN lying flat in bed, appears relatively comfortable HEENT no jaundice PULM clear CV RRR ABD soft EXT edema resolved ACCESS: R IJ TDC c/d/i  Imaging: VAS [place] UPPER EXT VEIN MAPPING (PRE-OP  AVF)  Result Date: 05/21/2021 [place] MAPPING Patient Name:  Ruben [name] [facility]  Date of Exam:   05/21/2021 Medical Rec #: [id number]              Accession #:    [id number] Date of Birth: [birth year]              Patient Gender: M Patient Age:   76 years Exam Location:  [facility] Procedure:      VAS [place] UPPER EXT VEIN MAPPING (PRE-OP AVF) Referring Phys: Jannifer Hick --------------------------------------------------------------------------------  Indications: Pre-access. Comparison Study: no prior Performing Technologist: Archie Patten RVS  Examination Guidelines: A complete evaluation includes B-mode imaging, spectral Doppler, color Doppler, and power Doppler as needed of all accessible portions of each vessel. Bilateral testing is considered an integral part of a complete examination. Limited examinations for reoccurring indications may be performed as noted. +-----------------+-------------+----------+--------------+  Right Cephalic    Diameter (cm) Depth (cm)    Findings     +-----------------+-------------+----------+--------------+  Shoulder  not visualized  +-----------------+-------------+----------+--------------+  Prox upper arm                             not visualized  +-----------------+-------------+----------+--------------+  Mid upper arm                              not visualized  +-----------------+-------------+----------+--------------+  Dist upper arm        0.13         0.31                    +-----------------+-------------+----------+--------------+  Antecubital fossa     0.18         0.24                    +-----------------+-------------+----------+--------------+  Prox forearm          0.28         0.34                    +-----------------+-------------+----------+--------------+  Mid forearm           0.23         0.20                    +-----------------+-------------+----------+--------------+  Dist forearm           0.21         0.14                    +-----------------+-------------+----------+--------------+  Wrist                 0.21         0.13                    +-----------------+-------------+----------+--------------+ +-----------------+-------------+----------+----------------------------+  Right Basilic     Diameter (cm) Depth (cm)           Findings            +-----------------+-------------+----------+----------------------------+  Prox upper arm                             not visualized and picc line  +-----------------+-------------+----------+----------------------------+  Mid upper arm                              not visualized and picc line  +-----------------+-------------+----------+----------------------------+  Dist upper arm        0.29         0.59                                  +-----------------+-------------+----------+----------------------------+  Antecubital fossa     0.24         0.45                                  +-----------------+-------------+----------+----------------------------+  Prox forearm          0.16         0.16                                  +-----------------+-------------+----------+----------------------------+  Mid forearm           0.15         0.18                                  +-----------------+-------------+----------+----------------------------+  Distal forearm                                    not visualized         +-----------------+-------------+----------+----------------------------+ +-----------------+-------------+----------+--------+  Left Cephalic     Diameter (cm) Depth (cm) Findings  +-----------------+-------------+----------+--------+  Shoulder              0.33         0.76              +-----------------+-------------+----------+--------+  Prox upper arm        0.23         0.37              +-----------------+-------------+----------+--------+  Mid upper arm         0.22         0.31               +-----------------+-------------+----------+--------+  Dist upper arm        0.22         0.48              +-----------------+-------------+----------+--------+  Antecubital fossa     0.31         0.18              +-----------------+-------------+----------+--------+  Prox forearm          0.22         0.22              +-----------------+-------------+----------+--------+  Mid forearm           0.21         0.23              +-----------------+-------------+----------+--------+  Dist forearm          0.21         0.13              +-----------------+-------------+----------+--------+ +-----------------+-------------+----------+---------+  Left Basilic      Diameter (cm) Depth (cm) Findings   +-----------------+-------------+----------+---------+  Prox upper arm        0.31         0.52               +-----------------+-------------+----------+---------+  Mid upper arm         0.21         0.18    branching  +-----------------+-------------+----------+---------+  Dist upper arm        0.25         0.18               +-----------------+-------------+----------+---------+  Antecubital fossa     0.24         0.26               +-----------------+-------------+----------+---------+  Prox forearm          0.27         0.13               +-----------------+-------------+----------+---------+  Mid forearm  0.20         0.11               +-----------------+-------------+----------+---------+  Distal forearm        0.23         0.14               +-----------------+-------------+----------+---------+ *See table(s) above for measurements and observations.  Diagnosing physician: Deitra Mayo MD Electronically signed by Deitra Mayo MD on 05/21/2021 at 3:22:50 PM.    Final    IR TUNNELED CENTRAL VENOUS CATHETER PLACEMENT  Result Date: 05/21/2021 INDICATION: 75 year old with acute on chronic kidney disease. Request for a tunneled dialysis catheter. EXAM: FLUOROSCOPIC AND ULTRASOUND GUIDED PLACEMENT  OF A TUNNELED DIALYSIS CATHETER Physician: Stephan Minister. Anselm Pancoast, MD MEDICATIONS: Ancef 2 g; The antibiotic was administered within an appropriate time interval prior to skin puncture. ANESTHESIA/SEDATION: Moderate (conscious) sedation was employed during this procedure. A total of Versed 1.5mg  and fentanyl 50 mcg was administered intravenously at the order of the provider performing the procedure. Total intra-service moderate sedation time: 19 minutes. Patient's level of consciousness and vital signs were monitored continuously by radiology nurse throughout the procedure under the supervision of the provider performing the procedure. FLUOROSCOPY TIME:  Fluoroscopy Time: 18 seconds, 1 mGy COMPLICATIONS: None immediate. PROCEDURE: The procedure was explained to the patient. The risks and benefits of the procedure were discussed and the patient's questions were addressed. Informed consent was obtained from the patient. The patient was placed supine on the interventional table. Ultrasound confirmed a patent right internal jugular vein. Ultrasound image obtained for documentation. The right neck and chest was prepped and draped in a sterile fashion. Maximal barrier sterile technique was utilized including caps, mask, sterile gowns, sterile gloves, sterile drape, hand hygiene and skin antiseptic. The right neck was anesthetized with 1% lidocaine. A small incision was made with #11 blade scalpel. A 21 gauge needle directed into the right internal jugular vein with ultrasound guidance. A micropuncture dilator set was placed. A 23 cm tip to cuff Palindrome catheter was selected. The skin below the right clavicle was anesthetized and a small incision was made with an #11 blade scalpel. A subcutaneous tunnel was formed to the vein dermatotomy site. The catheter was brought through the tunnel. The vein dermatotomy site was dilated to accommodate a peel-away sheath. The catheter was placed through the peel-away sheath and directed into  the central venous structures. The tip of the catheter was placed at superior cavoatrial junction with fluoroscopy. Fluoroscopic images were obtained for documentation. Both lumens were found to aspirate and flush well. The proper amount of heparin was flushed in both lumens. The vein dermatotomy site was closed using a single layer of absorbable suture and Dermabond. Gel-Foam placed in the subcutaneous tract. The catheter was secured to the skin using Prolene suture. Left jugular non tunneled dialysis catheter was removed at the end of the procedure. IMPRESSION: Successful placement of a right jugular tunneled dialysis catheter using ultrasound and fluoroscopic guidance. Electronically Signed   By: Markus Daft M.D.   On: 05/21/2021 11:04    Labs: BMET Recent Labs  Lab 05/16/21 1621 05/17/21 0310 05/17/21 1639 05/18/21 0339 05/19/21 0523 05/21/21 0449 05/22/21 0435  NA 134* 133* 137 133* 135 131* 132*  K 4.3 4.3 4.8 4.6 4.2 4.3 4.2  CL 99 99 101 99 96* 96* 99  CO2 25 25 25 25 26 24 22   GLUCOSE 138* 140* 146* 179* 109* 93 102*  BUN  19 12 9 14 10 19  37*  CREATININE 3.33* 2.89* 2.71* 3.75* 3.76* 6.31* 8.25*  CALCIUM 8.0* 8.0* 8.6* 8.3* 8.9 9.2 8.8*  PHOS 2.2* 1.9* 1.6* 1.9* 2.2* 3.3 4.4    CBC Recent Labs  Lab 05/19/21 0523 05/20/21 0400 05/21/21 0449 05/22/21 0434  WBC 6.2 6.6 7.0 7.3  HGB 12.7* 12.5* 13.3 12.6*  HCT 37.3* 36.7* 38.5* 36.7*  MCV 94.2 94.6 93.4 93.1  PLT 147* 140* 145* 140*     Medications:     amiodarone  200 mg Oral BID   atorvastatin  80 mg Oral Daily   Chlorhexidine Gluconate Cloth  6 each Topical T4656   folic acid  1 mg Oral Daily   mouth rinse  15 mL Mouth Rinse BID   midodrine  10 mg Oral TID WC   multivitamin  1 tablet Oral QHS   ondansetron  4 mg Oral TID WC   senna-docusate  1 tablet Oral BID   sodium chloride flush  10-40 mL Intracatheter Q12H   sodium chloride flush  3 mL Intravenous Q12H   thiamine  100 mg Oral Daily    Jannifer Hick  MD [facility] Pager [phone]

## 2021-05-22 NOTE — Progress Notes (Signed)
Nutrition Follow-up  DOCUMENTATION CODES:   Severe malnutrition in context of chronic illness  INTERVENTION:   - Continue renal MVI daily  - Continue Vital Cuisine Shake TID with meals, each supplement provides 520 kcal and 22 grams of protein  - Downgrade diet to dysphagia 3 for ease of intake, discussed with Nephrology MD who agreed  - Encourage PO intake  - Recommend continuing Zofran 4 mg TID with meals as pt reports that this is helping his nausea  NUTRITION DIAGNOSIS:   Severe Malnutrition related to chronic illness (CKD now requiring HD) as evidenced by severe muscle depletion, percent weight loss.  Ongoing, being addressed via oral nutrition supplements  GOAL:   Patient will meet greater than or equal to 90% of their needs  Progressing  MONITOR:   PO intake, Supplement acceptance, Labs, Weight trends, I & O's  REASON FOR ASSESSMENT:   LOS, Diagnosis (New HD)    ASSESSMENT:   76 yo male with a PMH of HTN, HLD, arthritis, EtOH abuse, tobacco abuse, and CKD stage 3b who presents with acute MI and new start on HD.  01/03 - s/p TEE 01/05 - R/L heart cath and coronary angio 01/09 - s/p TEE, DC-CV 01/17 - s/p tunneled HD catheter placement  Last iHD was on 1/18 with 1500 ml net UF.  Spoke with pt and family member at bedside. Pt reports appetite and PO intake have somewhat improved. Pt reports main difficulty is with "getting food down." He states that as he starts swallowing, the food "wants to come back up." Pt reports that the Zofran that has been ordered with meals is helping his nausea. He would like to continue this.  Pt reports that for breakfast today, he was able to consume 100% of the bacon, 100% of the Hormel Shake (520 kcal, 22 grams of protein), and ~50% of the eggs. He denies any N/V with breakfast meal. Pt reports that he likes the [facility] and has been drinking them when they are on his meal trays.  Pt's family member asking about  downgrading diet to pureed diet (dysphagia 1) to help with swallowing. Discussed that dysphagia 1 diet is extremely limited and often unappealing. Discussed option to downgrade to dysphagia 3 (mechanical chopped) and pt was in agreement to try this. Discussed with Nephrology MD who was in agreement.  Discussed with pt the importance of increasing kcal and protein intake to prevent additional dry weight loss and promote maintenance of lean muscle mass. Pt expresses understanding and will try to increase PO intake.  Admit weight: 77.5 kg Current weight: 71 kg  Meal Completion: 10-90%  Medications reviewed and include: folic acid, rena-vit, zofran 4 mg TID with meals, senna, thiamine, heparin drip  Labs reviewed. CBG's: 119-123 x 24 hours  I/O's: -5.0 L since admit  Diet Order:   Diet Order             DIET DYS 3 Room service appropriate? Yes; Fluid consistency: Thin; Fluid restriction: 2000 mL Fluid  Diet effective now                   EDUCATION NEEDS:   Education needs have been addressed  Skin:  Skin Assessment: Reviewed RN Assessment  Last BM:  05/22/21  Height:   Ht Readings from Last 1 Encounters:  05/13/21 5\' 11"  (1.803 m)    Weight:   Wt Readings from Last 1 Encounters:  05/23/21 71 kg    BMI:  Body mass index  is 21.83 kg/m.  Estimated Nutritional Needs:   Kcal:  2100-2300  Protein:  95-110 grams  Fluid:  1000 mL + UOP    Gustavus Bryant, MS, RD, LDN Inpatient Clinical Dietitian Please see AMiON for contact information.

## 2021-05-22 NOTE — TOC Progression Note (Signed)
Transition of Care [facility]) - Progression Note    Patient Details  Name: Ruben [name] MRN: [id number] Date of Birth: 1946/02/08  Transition of Care [facility]) CM/SW Contact  Zuleyka Kloc, LCSW Phone Number: 05/22/2021, 3:03 PM  Clinical Narrative:    HF CSW provided Olivia Mackie with [name] SSN so Olivia Mackie can set up outpatient HD.   Expected Discharge Plan: [place] Barriers to Discharge: Continued Medical Work up  Expected Discharge Plan and Services Expected Discharge Plan: [place] In-house Referral: Clinical Social Work Discharge Planning Services: CM Consult   Living arrangements for the past 2 months: Single Family Home                                       Social Determinants of Health (SDOH) Interventions Food Insecurity Interventions: Intervention Not Indicated Financial Strain Interventions: Other (Comment) (Pt.'s family reported some concerns about finances and lights or water getting turned off. CSW provided contact infor. to bring in shut off notice to see if hf team can help support.) Housing Interventions: Intervention Not Indicated Transportation Interventions: Intervention Not Indicated  Readmission Risk Interventions No flowsheet data found.

## 2021-05-22 NOTE — Progress Notes (Signed)
Mobility Specialist Progress Note    05/22/21 1639  Mobility  Activity Contraindicated/medical hold   Pt at HD. Will f/u tomorrow as schedule permits.

## 2021-05-22 NOTE — Progress Notes (Addendum)
Patient ID: Ruben [name], male   DOB: [birth year], 76 y.o.   MRN: [id number]     Advanced Heart Failure Rounding Note  PCP-Cardiologist: Sanda Klein, MD   Subjective:   12/26: Admit with late presentation MI. New AF 12/28: CGS initial co-ox 47%. Started NE + mil. 01/03: SR 05/08/20 Back in A fib and milrinone was cut back to 0.125 mcg.  05/09/20 Milrinone increased back to 0.25. Volume overload, elevated filling pressures on RHC with preserved CO on milrinone. Hypertensive. Back in ICU on nitro gtt.  LHC 1/5: occluded RCA (culprit), diffuse disease in LAD and Lcx. Seen by CVTS. Not felt to be a candidate for surgery. 1/9: S/P TEE followed by DC/CV -->SR.  1/11: Afib w/ RVR>>amio gtt started 1/11: Moved to ICU for CVVH  1/13: Stopped CVVH 1/14: iHD 1/15: DBA stopped. Given IV lasix with 10 cc urine output.  1/16: iHD  Remains off DBA. Awaiting Co-ox.   CVP 5  450 cc UOP charted yesterday  BP intermittently soft  No dyspnea or CP. Ambulated halls yesterday with CR.   Still doesn't have much appetite.   Objective:   Weight Range: 72.4 kg Body mass index is 22.26 kg/m.   Vital Signs:   Temp:  [97.6 F (36.4 C)-98.5 F (36.9 C)] 98.3 F (36.8 C) (01/18 0331) Pulse Rate:  [60-74] 74 (01/18 0700) Resp:  [11-30] 22 (01/18 0700) BP: (94-120)/(54-82) 120/68 (01/18 0700) SpO2:  [90 %-98 %] 90 % (01/18 0700) Last BM Date: 05/20/21  Weight change: Filed Weights   05/20/21 0500 05/20/21 1142 05/20/21 1500  Weight: 73.4 kg 73.3 kg 72.4 kg    Intake/Output:   Intake/Output Summary (Last 24 hours) at 05/22/2021 0736 Last data filed at 05/22/2021 0352 Gross per 24 hour  Intake 240.4 ml  Output 450 ml  Net -209.6 ml      Physical Exam  General:  No distress. Lying comfortably in bed. HEENT: normal, + RIJ tunneled cath Neck: supple. no JVD. Carotids 2+ bilat; no bruits.  Cor: PMI nondisplaced. Regular rate & rhythm. No rubs, gallops, 2/6 HSM at apex  Lungs:  clear Abdomen: soft, nontender, nondistended. No hepatosplenomegaly.  Extremities: no cyanosis, clubbing, rash, edema Neuro: alert & orientedx3, cranial nerves grossly intact. moves all 4 extremities w/o difficulty. Affect pleasant   Telemetry   SR 60s  Labs    CBC Recent Labs    05/21/21 0449 05/22/21 0434  WBC 7.0 7.3  HGB 13.3 12.6*  HCT 38.5* 36.7*  MCV 93.4 93.1  PLT 145* 381*   Basic Metabolic Panel Recent Labs    05/21/21 0449 05/22/21 0435  NA 131* 132*  K 4.3 4.2  CL 96* 99  CO2 24 22  GLUCOSE 93 102*  BUN 19 37*  CREATININE 6.31* 8.25*  CALCIUM 9.2 8.8*  PHOS 3.3 4.4   Liver Function Tests Recent Labs    05/21/21 0449 05/22/21 0435  ALBUMIN 3.5 3.2*    No results for input(s): LIPASE, AMYLASE in the last 72 hours. Cardiac Enzymes No results for input(s): CKTOTAL, CKMB, CKMBINDEX, TROPONINI in the last 72 hours.  BNP: BNP (last 3 results) Recent Labs    04/29/21 1251  BNP 1,659.6*    ProBNP (last 3 results) No results for input(s): PROBNP in the last 8760 hours.   D-Dimer No results for input(s): DDIMER in the last 72 hours. Hemoglobin A1C No results for input(s): HGBA1C in the last 72 hours. Fasting Lipid Panel No results for input(s):  CHOL, HDL, LDLCALC, TRIG, CHOLHDL, LDLDIRECT in the last 72 hours. Thyroid Function Tests No results for input(s): TSH, T4TOTAL, T3FREE, THYROIDAB in the last 72 hours.  Invalid input(s): FREET3  Other results:   Imaging    VAS [place] UPPER EXT VEIN MAPPING (PRE-OP AVF)  Result Date: 05/21/2021 UPPER EXTREMITY VEIN MAPPING Patient Name:  Ruben [name] [facility]  Date of Exam:   05/21/2021 Medical Rec #: [id number]              Accession #:    [id number] Date of Birth: [birth year]              Patient Gender: M Patient Age:   38 years Exam Location:  [facility] Procedure:      VAS [place] UPPER EXT VEIN MAPPING (PRE-OP AVF) Referring Phys: Jannifer Hick  --------------------------------------------------------------------------------  Indications: Pre-access. Comparison Study: no prior Performing Technologist: Archie Patten RVS  Examination Guidelines: A complete evaluation includes B-mode imaging, spectral Doppler, color Doppler, and power Doppler as needed of all accessible portions of each vessel. Bilateral testing is considered an integral part of a complete examination. Limited examinations for reoccurring indications may be performed as noted. +-----------------+-------------+----------+--------------+  Right Cephalic    Diameter (cm) Depth (cm)    Findings     +-----------------+-------------+----------+--------------+  Shoulder                                   not visualized  +-----------------+-------------+----------+--------------+  Prox upper arm                             not visualized  +-----------------+-------------+----------+--------------+  Mid upper arm                              not visualized  +-----------------+-------------+----------+--------------+  Dist upper arm        0.13         0.31                    +-----------------+-------------+----------+--------------+  Antecubital fossa     0.18         0.24                    +-----------------+-------------+----------+--------------+  Prox forearm          0.28         0.34                    +-----------------+-------------+----------+--------------+  Mid forearm           0.23         0.20                    +-----------------+-------------+----------+--------------+  Dist forearm          0.21         0.14                    +-----------------+-------------+----------+--------------+  Wrist                 0.21         0.13                    +-----------------+-------------+----------+--------------+ +-----------------+-------------+----------+----------------------------+  Right Basilic     Diameter (cm) Depth (cm)  Findings             +-----------------+-------------+----------+----------------------------+  Prox upper arm                             not visualized and picc line  +-----------------+-------------+----------+----------------------------+  Mid upper arm                              not visualized and picc line  +-----------------+-------------+----------+----------------------------+  Dist upper arm        0.29         0.59                                  +-----------------+-------------+----------+----------------------------+  Antecubital fossa     0.24         0.45                                  +-----------------+-------------+----------+----------------------------+  Prox forearm          0.16         0.16                                  +-----------------+-------------+----------+----------------------------+  Mid forearm           0.15         0.18                                  +-----------------+-------------+----------+----------------------------+  Distal forearm                                    not visualized         +-----------------+-------------+----------+----------------------------+ +-----------------+-------------+----------+--------+  Left Cephalic     Diameter (cm) Depth (cm) Findings  +-----------------+-------------+----------+--------+  Shoulder              0.33         0.76              +-----------------+-------------+----------+--------+  Prox upper arm        0.23         0.37              +-----------------+-------------+----------+--------+  Mid upper arm         0.22         0.31              +-----------------+-------------+----------+--------+  Dist upper arm        0.22         0.48              +-----------------+-------------+----------+--------+  Antecubital fossa     0.31         0.18              +-----------------+-------------+----------+--------+  Prox forearm          0.22         0.22              +-----------------+-------------+----------+--------+  Mid forearm  0.21          0.23              +-----------------+-------------+----------+--------+  Dist forearm          0.21         0.13              +-----------------+-------------+----------+--------+ +-----------------+-------------+----------+---------+  Left Basilic      Diameter (cm) Depth (cm) Findings   +-----------------+-------------+----------+---------+  Prox upper arm        0.31         0.52               +-----------------+-------------+----------+---------+  Mid upper arm         0.21         0.18    branching  +-----------------+-------------+----------+---------+  Dist upper arm        0.25         0.18               +-----------------+-------------+----------+---------+  Antecubital fossa     0.24         0.26               +-----------------+-------------+----------+---------+  Prox forearm          0.27         0.13               +-----------------+-------------+----------+---------+  Mid forearm           0.20         0.11               +-----------------+-------------+----------+---------+  Distal forearm        0.23         0.14               +-----------------+-------------+----------+---------+ *See table(s) above for measurements and observations.  Diagnosing physician: Deitra Mayo MD Electronically signed by Deitra Mayo MD on 05/21/2021 at 3:22:50 PM.    Final    IR TUNNELED CENTRAL VENOUS CATHETER PLACEMENT  Result Date: 05/21/2021 INDICATION: 76 year old with acute on chronic kidney disease. Request for a tunneled dialysis catheter. EXAM: FLUOROSCOPIC AND ULTRASOUND GUIDED PLACEMENT OF A TUNNELED DIALYSIS CATHETER Physician: Stephan Minister. Anselm Pancoast, MD MEDICATIONS: Ancef 2 g; The antibiotic was administered within an appropriate time interval prior to skin puncture. ANESTHESIA/SEDATION: Moderate (conscious) sedation was employed during this procedure. A total of Versed 1.5mg  and fentanyl 50 mcg was administered intravenously at the order of the provider performing the procedure. Total  intra-service moderate sedation time: 19 minutes. Patient's level of consciousness and vital signs were monitored continuously by radiology nurse throughout the procedure under the supervision of the provider performing the procedure. FLUOROSCOPY TIME:  Fluoroscopy Time: 18 seconds, 1 mGy COMPLICATIONS: None immediate. PROCEDURE: The procedure was explained to the patient. The risks and benefits of the procedure were discussed and the patient's questions were addressed. Informed consent was obtained from the patient. The patient was placed supine on the interventional table. Ultrasound confirmed a patent right internal jugular vein. Ultrasound image obtained for documentation. The right neck and chest was prepped and draped in a sterile fashion. Maximal barrier sterile technique was utilized including caps, mask, sterile gowns, sterile gloves, sterile drape, hand hygiene and skin antiseptic. The right neck was anesthetized with 1% lidocaine. A small incision was made with #11 blade scalpel. A 21 gauge needle directed into the right internal jugular vein with ultrasound guidance. A  micropuncture dilator set was placed. A 23 cm tip to cuff Palindrome catheter was selected. The skin below the right clavicle was anesthetized and a small incision was made with an #11 blade scalpel. A subcutaneous tunnel was formed to the vein dermatotomy site. The catheter was brought through the tunnel. The vein dermatotomy site was dilated to accommodate a peel-away sheath. The catheter was placed through the peel-away sheath and directed into the central venous structures. The tip of the catheter was placed at superior cavoatrial junction with fluoroscopy. Fluoroscopic images were obtained for documentation. Both lumens were found to aspirate and flush well. The proper amount of heparin was flushed in both lumens. The vein dermatotomy site was closed using a single layer of absorbable suture and Dermabond. Gel-Foam placed in the  subcutaneous tract. The catheter was secured to the skin using Prolene suture. Left jugular non tunneled dialysis catheter was removed at the end of the procedure. IMPRESSION: Successful placement of a right jugular tunneled dialysis catheter using ultrasound and fluoroscopic guidance. Electronically Signed   By: Markus Daft M.D.   On: 05/21/2021 11:04     Medications:     Scheduled Medications:  amiodarone  200 mg Oral BID   atorvastatin  80 mg Oral Daily   Chlorhexidine Gluconate Cloth  6 each Topical [id number]   folic acid  1 mg Oral Daily   mouth rinse  15 mL Mouth Rinse BID   midodrine  10 mg Oral TID WC   multivitamin  1 tablet Oral QHS   ondansetron  4 mg Oral TID WC   senna-docusate  1 tablet Oral BID   sodium chloride flush  10-40 mL Intracatheter Q12H   sodium chloride flush  3 mL Intravenous Q12H   thiamine  100 mg Oral Daily    Infusions:  sodium chloride     sodium chloride     sodium chloride     heparin 700 Units/hr (05/21/21 1450)    PRN Medications: sodium chloride, sodium chloride, sodium chloride, acetaminophen, ALPRAZolam, alteplase, benzonatate, heparin, lidocaine (PF), lidocaine-prilocaine, ondansetron (ZOFRAN) IV, pentafluoroprop-tetrafluoroeth, pentafluoroprop-tetrafluoroeth, polyethylene glycol, sodium chloride flush, sodium chloride flush    Assessment/Plan   1. CAD: Suspect delayed presentation LCx (versus RCA) MI.  Echo with EF 35-40%, inferolateral and basal inferior akinesis, moderate-severe eccentric likely infarct-related MR, normal RV.  He was not cathed initially due to AKI and delayed presentation. Eventual LHC with occluded RCA (culprit), diffuse moderate disease in LAD and Lcx. No good PCI targets. Not a candidate for CABG. No chest pain.  - Continue statin - Anticoagulated.  2. Acute systolic CHF/cardiogenic shock: Ischemic cardiomyopathy with infarct-related MR. Echo with EF 35-40%, inferolateral and basal inferior akinesis, moderate-severe  eccentric likely infarct-related MR, normal RV.  Echo does not look bad enough to explain low output HF, concerned that MR may be playing a role.  TEE on 01/09 with EF 35-40%, moderate MR (suspect infarct-related), aortic dilatation measuring 44 mm, moderate AI. RHC on 0.25 milrinone showed RA mean 16, PA 66/26 (41), PCWP 27, PA 62%, Fick CO 5.04/ CI 2.56.  He was weaned off pressors but restarted dobutamine with persistently low co-ox.   CVVH stopped on 1/13 but started iHD. At this point, looks like he will need iHD ongoing.  - Remains off DBA. Co-ox pending. Hopefully can remove PICC line soon. - Continue midodrine 10 tid.  - CVP 5 iHD for volume management, has been tolerating. Tunneled HD cath placed 01/17 3. Hyponatremia: Managed via HD.  4. Valvular heart disease:  MR moderate in severity on TEE. Likely infarct related. Moderate AI.  5. Atrial fibrillation: First noted this admission.  S/P TEE followed DC-CV--> NSR.  In and out of AF, currently NSR.  Hopefully will hold NSR better off dobutamine.  - Continue amiodarone 200 mg twice a day.  - Heparin gtt until access finalized then Eliquis.  6. AKI on CKD 3: Baseline creatinine around 1.4 in 6/22. Peak at 3.09 and trended down, now 2.2 -> 2.6 -> 3.1 > 4.0. > 5.3 > 6.58 >7.26 > CVVH started. 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 Renal US with no hydronephrosis. CVVH started 1/11 and stopped 1/13.  However, minimal UOP and creatinine rose so started iHD on 1/14.  Suspect he will need iHD for now.   - Tunneled catheter 01/17 - 450 cc UOP charted yesterday 7. Active smoker: Needs to quit.  8. ETOH abuse: Needs to cut back.  9. Thrombocytopenia: Mild, pre-dates heparin initiation.  10. Hyperglycemia: - Continue SSI  - A1c 5.7  Continue to mobilize. Encouraged po intake.   [facility], LINDSAY N 05/22/2021 7:36 AM  Patient seen with PA, agree with the above note.   No dyspnea or chest pain.  CVP 5, co-ox 62% off inotropes.  He remains in NSR on po  amiodarone.  General: NAD Neck: No JVD, no thyromegaly or thyroid nodule.  Lungs: Clear to auscultation bilaterally with normal respiratory effort. CV: Nondisplaced PMI.  Heart regular S1/S2, no S3/S4, 2/6 HSM apex.  No peripheral edema.   Abdomen: Soft, nontender, no hepatosplenomegaly, no distention.  Skin: Intact without lesions or rashes.  Neurologic: Alert and oriented x 3.  Psych: Normal affect. Extremities: No clubbing or cyanosis.  HEENT: Normal.   Volume status ok, made 450 cc UOP yesterday.  Plan per renal for HD today.  Will need vascular planning for access.   He will continue amiodarone po and heparin gtt for AF for now, when all access plans have been finalized, can transition to Eliquis.   Loralie Champagne 05/22/2021 1:55 PM

## 2021-05-22 NOTE — Progress Notes (Signed)
Requested by Dr Johnney Ou to see pt for likely out-pt HD needs. Met with pt at bedside who gave renal navigator permission to speak in front of granddaughter who is at bedside. Pt answered a few questions prior to being taken to HD. Pt gave navigator permission to finish conversation with granddaughter. Discussed role and clinic placement process. Granddaughter prefers a clinic close to pt's home. Discussed several clinics and granddaughter prefers [facility]. Will proceed with referral to [facility] admissions today with AKI diagnosis per MD. Family prefers MWF 2nd shift appt if possible. Explained that clinic may not have availability  at this time but will inquire about options. Family plans to assist with transportation to/from HD appts. Will follow and assist.   Melven Sartorius Renal Navigator [phone]

## 2021-05-22 NOTE — Progress Notes (Signed)
CARDIAC REHAB PHASE I   PRE:  Rate/Rhythm: 69 SR    BP: sitting 97/57    SaO2: 97 RA  MODE:  Ambulation: 680 ft   POST:  Rate/Rhythm: 86 SR    BP: sitting 120/57     SaO2: 97 RA  Tolerated well, no assist needed, no major c/o.   1020-1040  [employer] CES, ACSM 05/22/2021 10:38 AM

## 2021-05-23 LAB — RENAL FUNCTION PANEL
Albumin: 3.4 g/dL — ABNORMAL LOW (ref 3.5–5.0)
Anion gap: 11 (ref 5–15)
BUN: 25 mg/dL — ABNORMAL HIGH (ref 8–23)
CO2: 26 mmol/L (ref 22–32)
Calcium: 9.3 mg/dL (ref 8.9–10.3)
Chloride: 100 mmol/L (ref 98–111)
Creatinine, Ser: 7.3 mg/dL — ABNORMAL HIGH (ref 0.61–1.24)
GFR, Estimated: 7 mL/min — ABNORMAL LOW (ref >60)
Glucose, Bld: 107 mg/dL — ABNORMAL HIGH (ref 70–99)
Phosphorus: 3.7 mg/dL (ref 2.5–4.6)
Potassium: 4.2 mmol/L (ref 3.5–5.1)
Sodium: 137 mmol/L (ref 135–145)

## 2021-05-23 LAB — CBC
HCT: 37.3 % — ABNORMAL LOW (ref 39.0–52.0)
Hemoglobin: 13.1 g/dL (ref 13.0–17.0)
MCH: 32 pg (ref 26.0–34.0)
MCHC: 35.1 g/dL (ref 30.0–36.0)
MCV: 91.2 fL (ref 80.0–100.0)
Platelets: 140 10*3/uL — ABNORMAL LOW (ref 150–400)
RBC: 4.09 MIL/uL — ABNORMAL LOW (ref 4.22–5.81)
RDW: 13.8 % (ref 11.5–15.5)
WBC: 6.6 10*3/uL (ref 4.0–10.5)
nRBC: 0 % (ref 0.0–0.2)

## 2021-05-23 LAB — HEPARIN LEVEL (UNFRACTIONATED): Heparin Unfractionated: 0.63 IU/mL (ref 0.30–0.70)

## 2021-05-23 LAB — COOXEMETRY PANEL
Carboxyhemoglobin: 1 % (ref 0.5–1.5)
Methemoglobin: 0.9 % (ref 0.0–1.5)
O2 Saturation: 61.9 %
Total hemoglobin: 12 g/dL (ref 12.0–16.0)

## 2021-05-23 LAB — APTT: aPTT: 82 seconds — ABNORMAL HIGH (ref 24–36)

## 2021-05-23 LAB — GLUCOSE, CAPILLARY
Glucose-Capillary: 119 mg/dL — ABNORMAL HIGH (ref 70–99)
Glucose-Capillary: 132 mg/dL — ABNORMAL HIGH (ref 70–99)
Glucose-Capillary: 120 mg/dL — ABNORMAL HIGH (ref 70–99)
Glucose-Capillary: 129 mg/dL — ABNORMAL HIGH (ref 70–99)

## 2021-05-23 NOTE — Progress Notes (Signed)
[place] for Eliquis > Heparin Indication: atrial fibrillation  Allergies  Allergen Reactions   Other Other (See Comments)    Pollen - unknown    Patient Measurements: Height: 5\' 11"  (180.3 cm) Weight: 71 kg (156 lb 8.4 oz) IBW/kg (Calculated) : 75.3 Heparin Dosing Weight: 78 kg  Vital Signs: Temp: 97.8 F (36.6 C) (01/19 1047) Temp Source: Oral (01/19 1047) BP: 104/55 (01/19 1047) Pulse Rate: 66 (01/19 1047)  Labs: Recent Labs    05/21/21 0449 05/22/21 0434 05/22/21 0435 05/23/21 0430  HGB 13.3 12.6*  --  13.1  HCT 38.5* 36.7*  --  37.3*  PLT 145* 140*  --  140*  APTT 72* 78*  --  82*  HEPARINUNFRC 1.09*  --  0.79* 0.63  CREATININE 6.31*  --  8.25* 7.30*     Estimated Creatinine Clearance: 8.8 mL/min (A) (by C-G formula based on SCr of 7.3 mg/dL (H)).   Assessment: 76 yo male on chronic Eliquis for afib, now initiated on CRRT and pharmacy asked to transition Eliquis to IV heparin.     Recent DCCV 1/9 but went back into AF. Of note, last dose of Eliquis given 1/11 at 2200 pm. Heparin level affected by Eliquis so utilizing aPTT for monitoring until levels correlate.  Heparin level 0.6 and aPTT 80sec  - therapeutic at on heparin drip 700 units/hr. No s/sx of bleeding or infusion issues.  Aptt and heparin level correlating - dose via heparin levels moving forward  Goal of Therapy:  Heparin level 0.3-0.7 units/ml; aPTT 66-102 sec Monitor platelets by anticoagulation protocol: Yes   Plan:  Continue heparin to 700 units/hr - Daily heparin level and CBC Will transition to oral anticoagulation when procedures complete    Bonnita Nasuti Pharm.D. CPP, BCPS Clinical Pharmacist [phone] 05/23/2021 12:46 PM    Please check AMION for all [place] phone numbers After 10:00 PM, call [place] [phone]

## 2021-05-23 NOTE — Plan of Care (Signed)

## 2021-05-23 NOTE — Progress Notes (Signed)
Patient ID: Ruben [name], male   DOB: May 23, 1945, 76 y.o.   MRN: [id number]     Advanced Heart Failure Rounding Note  PCP-Cardiologist: Sanda Klein, MD   Subjective:   12/26: Admit with late presentation MI. New AF 12/28: CGS initial co-ox 47%. Started NE + mil. 01/03: SR 05/08/20 Back in A fib and milrinone was cut back to 0.125 mcg.  05/09/20 Milrinone increased back to 0.25. Volume overload, elevated filling pressures on RHC with preserved CO on milrinone. Hypertensive. Back in ICU on nitro gtt.  LHC 1/5: occluded RCA (culprit), diffuse disease in LAD and Lcx. Seen by CVTS. Not felt to be a candidate for surgery. 1/9: S/P TEE followed by DC/CV -->SR.  1/11: Afib w/ RVR>>amio gtt started 1/11: Moved to ICU for CVVH  1/13: Stopped CVVH 1/14: iHD 1/15: DBA stopped. Given IV lasix with 10 cc urine output.  1/16: iHD 1/18: iHD  Co-ox stable 62%.  No dyspnea or chest pain. Remains in NSR. Minimal UOP.    Objective:   Weight Range: 71 kg Body mass index is 21.83 kg/m.   Vital Signs:   Temp:  [97.1 F (36.2 C)-99 F (37.2 C)] 97.8 F (36.6 C) (01/19 1047) Pulse Rate:  [58-69] 66 (01/19 1047) Resp:  [14-25] 16 (01/19 1047) BP: (95-111)/(50-66) 104/55 (01/19 1047) SpO2:  [92 %-99 %] 99 % (01/19 1047) Weight:  [71 kg] 71 kg (01/19 0401) Last BM Date: 05/22/21  Weight change: Filed Weights   05/20/21 1500 05/22/21 0755 05/23/21 0401  Weight: 72.4 kg 71.7 kg 71 kg    Intake/Output:   Intake/Output Summary (Last 24 hours) at 05/23/2021 1156 Last data filed at 05/23/2021 0932 Gross per 24 hour  Intake 662.8 ml  Output 1500 ml  Net -837.2 ml      Physical Exam  General: NAD Neck: No JVD, no thyromegaly or thyroid nodule.  Lungs: Clear to auscultation bilaterally with normal respiratory effort. CV: Nondisplaced PMI.  Heart regular S1/S2, no S3/S4, 2/6 HSM apex.  No peripheral edema.   Abdomen: Soft, nontender, no hepatosplenomegaly, no distention.  Skin:  Intact without lesions or rashes.  Neurologic: Alert and oriented x 3.  Psych: Normal affect. Extremities: No clubbing or cyanosis.  HEENT: Normal.   Telemetry   SR 60s (personally reviewed)  Labs    CBC Recent Labs    05/22/21 0434 05/23/21 0430  WBC 7.3 6.6  HGB 12.6* 13.1  HCT 36.7* 37.3*  MCV 93.1 91.2  PLT 140* 462*   Basic Metabolic Panel Recent Labs    05/22/21 0435 05/23/21 0430  NA 132* 137  K 4.2 4.2  CL 99 100  CO2 22 26  GLUCOSE 102* 107*  BUN 37* 25*  CREATININE 8.25* 7.30*  CALCIUM 8.8* 9.3  PHOS 4.4 3.7   Liver Function Tests Recent Labs    05/22/21 0435 05/23/21 0430  ALBUMIN 3.2* 3.4*    No results for input(s): LIPASE, AMYLASE in the last 72 hours. Cardiac Enzymes No results for input(s): CKTOTAL, CKMB, CKMBINDEX, TROPONINI in the last 72 hours.  BNP: BNP (last 3 results) Recent Labs    04/29/21 1251  BNP 1,659.6*    ProBNP (last 3 results) No results for input(s): PROBNP in the last 8760 hours.   D-Dimer No results for input(s): DDIMER in the last 72 hours. Hemoglobin A1C No results for input(s): HGBA1C in the last 72 hours. Fasting Lipid Panel No results for input(s): CHOL, HDL, LDLCALC, TRIG, CHOLHDL, LDLDIRECT in the  last 72 hours. Thyroid Function Tests No results for input(s): TSH, T4TOTAL, T3FREE, THYROIDAB in the last 72 hours.  Invalid input(s): FREET3  Other results:   Imaging    No results found.   Medications:     Scheduled Medications:  amiodarone  200 mg Oral BID   atorvastatin  80 mg Oral Daily   Chlorhexidine Gluconate Cloth  6 each Topical A3557   folic acid  1 mg Oral Daily   mouth rinse  15 mL Mouth Rinse BID   midodrine  10 mg Oral TID WC   multivitamin  1 tablet Oral QHS   ondansetron  4 mg Oral TID WC   senna-docusate  1 tablet Oral BID   sodium chloride flush  10-40 mL Intracatheter Q12H   sodium chloride flush  3 mL Intravenous Q12H   thiamine  100 mg Oral Daily     Infusions:  sodium chloride     sodium chloride     sodium chloride     heparin 700 Units/hr (05/23/21 0258)    PRN Medications: sodium chloride, sodium chloride, sodium chloride, acetaminophen, ALPRAZolam, alteplase, benzonatate, heparin, lidocaine (PF), lidocaine-prilocaine, ondansetron (ZOFRAN) IV, pentafluoroprop-tetrafluoroeth, pentafluoroprop-tetrafluoroeth, polyethylene glycol, sodium chloride flush, sodium chloride flush    Assessment/Plan   1. CAD: Suspect delayed presentation LCx (versus RCA) MI.  Echo with EF 35-40%, inferolateral and basal inferior akinesis, moderate-severe eccentric likely infarct-related MR, normal RV.  He was not cathed initially due to AKI and delayed presentation. Eventual LHC with occluded RCA (culprit), diffuse moderate disease in LAD and Lcx. No good PCI targets. Not a candidate for CABG. No chest pain.  - Continue statin - Anticoagulated.  2. Acute systolic CHF/cardiogenic shock: Ischemic cardiomyopathy with infarct-related MR. Echo with EF 35-40%, inferolateral and basal inferior akinesis, moderate-severe eccentric likely infarct-related MR, normal RV.  Echo does not look bad enough to explain low output HF, concerned that MR may be playing a role.  TEE on 01/09 with EF 35-40%, moderate MR (suspect infarct-related), aortic dilatation measuring 44 mm, moderate AI. RHC on 0.25 milrinone showed RA mean 16, PA 66/26 (41), PCWP 27, PA 62%, Fick CO 5.04/ CI 2.56.  He was weaned off pressors but restarted dobutamine with persistently low co-ox.   CVVH stopped on 1/13 but started iHD. At this point, looks like he will need iHD ongoing. Co-ox 62% today off dobutamine.  - Continue midodrine 10 tid.  - HD for volume management.  3. Hyponatremia: Managed via HD.  4. Valvular heart disease:  MR moderate in severity on TEE. Likely infarct related. Moderate AI.  5. Atrial fibrillation: First noted this admission.  S/P TEE followed DC-CV--> NSR.  In and out of AF,  currently NSR.  Hopefully will hold NSR better off dobutamine.  - Continue amiodarone 200 mg twice a day.  - Heparin gtt until access finalized then Eliquis.  6. AKI on CKD 3: Baseline creatinine around 1.4 in 6/22. Peak at 3.09 and trended down, now 2.2 -> 2.6 -> 3.1 > 4.0. > 5.3 > 6.58 >7.26 > CVVH started. 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 Renal US with no hydronephrosis. CVVH started 1/11 and stopped 1/13.  However, minimal UOP and creatinine rose so started iHD on 1/14.  Suspect he will need iHD for now.   Still with minimal UOP.  - Next HD tomorrow.   7. Active smoker: Needs to quit.  8. ETOH abuse: Needs to cut back.  9. Thrombocytopenia: Mild, pre-dates heparin initiation.  10. Hyperglycemia: - Continue  SSI  - A1c 5.7  Ready for home from cardiac standpoint.  Will follow nephrology's lead at this point.  Needs to transition to Eliquis when no more procedures anticipated.   Loralie Champagne 05/23/2021 11:56 AM

## 2021-05-23 NOTE — Progress Notes (Signed)
CARDIAC REHAB PHASE I   PRE:  Rate/Rhythm: 62 SR    BP: sitting 97/57    SaO2: 97 RA  MODE:  Ambulation: 660 ft   POST:  Rate/Rhythm: 80 SR    BP: sitting 108/64     SaO2: 99 RA  Tolerated well, no assist. No c/o. [phone]   Christell Constant Lindzie Boxx CES, ACSM 05/23/2021 3:21 PM

## 2021-05-23 NOTE — Progress Notes (Signed)
Mobility Specialist Progress Note    05/23/21 1151  Mobility  Bed Position Chair  Activity Ambulated independently in hallway  Level of Assistance Standby assist, set-up cues, supervision of patient - no hands on  Assistive Device None  Distance Ambulated (ft) 410 ft  Activity Response Tolerated well  $Mobility charge 1 Mobility   Pre-Mobility: 68 HR, 96% SpO2 Post-Mobility: 64 HR, 96% SpO2  Pt received in bed and agreeable. No complaints on walk. Returned to chair with call bell in reach.   [facility] Mobility Specialist  M.S. 2C and 6E: [phone] M.S. 4E: [phone]

## 2021-05-23 NOTE — Progress Notes (Signed)
[facility] Progress Note    Assessment/ Plan:    AKI on CKD 3b:normally sees Dr Posey Pronto.  On ARB as OP.  He has had both significant hyper- and hypotensive episodes during this hospitalization requiring treatment and a contrast load on 05/09/21.  Renal US without obstruction and R 6 mm renal calculus.  UA on admission bland.AKI component suspected ATN + CIN combination + cardiorenal sydrome - CRRT 05/15/21-05/17/21 now tolerating iHD with midodrine 10 TID support.  Last HD 1/18 1L UF.  Next HD tomorrow . - he's had intermittent UOP but not reliably -Had [facility] placed 1/17.  Vein map.   Will monitor for renal recovery in the coming days, but I suspect he'll be ESRD and need a perm access; discussed today.      2.  Acute on chronic systolic CHF: EF 32-67%, previously on inotropes, now on midodrine and off  dobutamine.  CHF managing.    3.  CAD - LHC 05/09/21, 3V disease - not thought to be a candidate for surgery per CVTS   4.  Late-presenting MI - trops peaked > 20,000   5.  Afib: - s/p DCCV 05/13/21 - PO amiodarone - Eliquis --> hep gtt for now in case procedures needed   6.  MR: thought to be infarct related   7.  Hyponatremia - improving and mild  8.  Noted tobacco and EtOH use PTA   9.  Dispo: inpatient but making progress towards disposition.    Subjective:    Now in stepdown.  Cardiac rehab underway.  No new issues.  Not great po intake - RD working on some issues.  Wife bedside.  Had HD yest w/o issue, UF 1.5L    Objective:   BP (!) 104/55    Pulse 66    Temp 97.8 F (36.6 C) (Oral)    Resp 16    Ht 5\' 11"  (1.803 m)    Wt 71 kg    SpO2 99%    BMI 21.83 kg/m   Intake/Output Summary (Last 24 hours) at 05/23/2021 1116 Last data filed at 05/23/2021 0932 Gross per 24 hour  Intake 662.8 ml  Output 1500 ml  Net -837.2 ml    Weight change:   Physical Exam: GEN lying flat in bed, appears relatively comfortable HEENT no jaundice PULM clear CV RRR ABD  soft EXT edema resolved ACCESS: R IJ TDC c/d/i  Imaging: VAS [place] UPPER EXT VEIN MAPPING (PRE-OP AVF)  Result Date: 05/21/2021 [place] MAPPING Patient Name:  Ruben [name] [facility]  Date of Exam:   05/21/2021 Medical Rec #: [id number]              Accession #:    [id number] Date of Birth: 08-29-1945              Patient Gender: M Patient Age:   76 years Exam Location:  [facility] Procedure:      VAS [place] UPPER EXT VEIN MAPPING (PRE-OP AVF) Referring Phys: Jannifer Hick --------------------------------------------------------------------------------  Indications: Pre-access. Comparison Study: no prior Performing Technologist: Archie Patten RVS  Examination Guidelines: A complete evaluation includes B-mode imaging, spectral Doppler, color Doppler, and power Doppler as needed of all accessible portions of each vessel. Bilateral testing is considered an integral part of a complete examination. Limited examinations for reoccurring indications may be performed as noted. +-----------------+-------------+----------+--------------+  Right Cephalic    Diameter (cm) Depth (cm)    Findings     +-----------------+-------------+----------+--------------+  Shoulder  not visualized  +-----------------+-------------+----------+--------------+  Prox upper arm                             not visualized  +-----------------+-------------+----------+--------------+  Mid upper arm                              not visualized  +-----------------+-------------+----------+--------------+  Dist upper arm        0.13         0.31                    +-----------------+-------------+----------+--------------+  Antecubital fossa     0.18         0.24                    +-----------------+-------------+----------+--------------+  Prox forearm          0.28         0.34                    +-----------------+-------------+----------+--------------+  Mid forearm           0.23         0.20                     +-----------------+-------------+----------+--------------+  Dist forearm          0.21         0.14                    +-----------------+-------------+----------+--------------+  Wrist                 0.21         0.13                    +-----------------+-------------+----------+--------------+ +-----------------+-------------+----------+----------------------------+  Right Basilic     Diameter (cm) Depth (cm)           Findings            +-----------------+-------------+----------+----------------------------+  Prox upper arm                             not visualized and picc line  +-----------------+-------------+----------+----------------------------+  Mid upper arm                              not visualized and picc line  +-----------------+-------------+----------+----------------------------+  Dist upper arm        0.29         0.59                                  +-----------------+-------------+----------+----------------------------+  Antecubital fossa     0.24         0.45                                  +-----------------+-------------+----------+----------------------------+  Prox forearm          0.16         0.16                                  +-----------------+-------------+----------+----------------------------+  Mid forearm           0.15         0.18                                  +-----------------+-------------+----------+----------------------------+  Distal forearm                                    not visualized         +-----------------+-------------+----------+----------------------------+ +-----------------+-------------+----------+--------+  Left Cephalic     Diameter (cm) Depth (cm) Findings  +-----------------+-------------+----------+--------+  Shoulder              0.33         0.76              +-----------------+-------------+----------+--------+  Prox upper arm        0.23         0.37              +-----------------+-------------+----------+--------+   Mid upper arm         0.22         0.31              +-----------------+-------------+----------+--------+  Dist upper arm        0.22         0.48              +-----------------+-------------+----------+--------+  Antecubital fossa     0.31         0.18              +-----------------+-------------+----------+--------+  Prox forearm          0.22         0.22              +-----------------+-------------+----------+--------+  Mid forearm           0.21         0.23              +-----------------+-------------+----------+--------+  Dist forearm          0.21         0.13              +-----------------+-------------+----------+--------+ +-----------------+-------------+----------+---------+  Left Basilic      Diameter (cm) Depth (cm) Findings   +-----------------+-------------+----------+---------+  Prox upper arm        0.31         0.52               +-----------------+-------------+----------+---------+  Mid upper arm         0.21         0.18    branching  +-----------------+-------------+----------+---------+  Dist upper arm        0.25         0.18               +-----------------+-------------+----------+---------+  Antecubital fossa     0.24         0.26               +-----------------+-------------+----------+---------+  Prox forearm          0.27         0.13               +-----------------+-------------+----------+---------+  Mid forearm  0.20         0.11               +-----------------+-------------+----------+---------+  Distal forearm        0.23         0.14               +-----------------+-------------+----------+---------+ *See table(s) above for measurements and observations.  Diagnosing physician: Deitra Mayo MD Electronically signed by Deitra Mayo MD on 05/21/2021 at 3:22:50 PM.    Final     Labs: BMET Recent Labs  Lab 05/17/21 0310 05/17/21 1639 05/18/21 0017 05/19/21 0523 05/21/21 0449 05/22/21 0435 05/23/21 0430  NA 133* 137 133* 135 131* 132* 137   K 4.3 4.8 4.6 4.2 4.3 4.2 4.2  CL 99 101 99 96* 96* 99 100  CO2 25 25 25 26 24 22 26   GLUCOSE 140* 146* 179* 109* 93 102* 107*  BUN 12 9 14 10 19  37* 25*  CREATININE 2.89* 2.71* 3.75* 3.76* 6.31* 8.25* 7.30*  CALCIUM 8.0* 8.6* 8.3* 8.9 9.2 8.8* 9.3  PHOS 1.9* 1.6* 1.9* 2.2* 3.3 4.4 3.7    CBC Recent Labs  Lab 05/20/21 0400 05/21/21 0449 05/22/21 0434 05/23/21 0430  WBC 6.6 7.0 7.3 6.6  HGB 12.5* 13.3 12.6* 13.1  HCT 36.7* 38.5* 36.7* 37.3*  MCV 94.6 93.4 93.1 91.2  PLT 140* 145* 140* 140*     Medications:     amiodarone  200 mg Oral BID   atorvastatin  80 mg Oral Daily   Chlorhexidine Gluconate Cloth  6 each Topical C9449   folic acid  1 mg Oral Daily   mouth rinse  15 mL Mouth Rinse BID   midodrine  10 mg Oral TID WC   multivitamin  1 tablet Oral QHS   ondansetron  4 mg Oral TID WC   senna-docusate  1 tablet Oral BID   sodium chloride flush  10-40 mL Intracatheter Q12H   sodium chloride flush  3 mL Intravenous Q12H   thiamine  100 mg Oral Daily    Jannifer Hick MD [facility] Pager [phone]

## 2021-05-24 DIAGNOSIS — N183 Chronic kidney disease, stage 3 unspecified: Secondary | ICD-10-CM

## 2021-05-24 DIAGNOSIS — Z7982 Long term (current) use of aspirin: Secondary | ICD-10-CM

## 2021-05-24 DIAGNOSIS — I4891 Unspecified atrial fibrillation: Secondary | ICD-10-CM

## 2021-05-24 DIAGNOSIS — Z79899 Other long term (current) drug therapy: Secondary | ICD-10-CM

## 2021-05-24 DIAGNOSIS — N179 Acute kidney failure, unspecified: Secondary | ICD-10-CM

## 2021-05-24 DIAGNOSIS — I2510 Atherosclerotic heart disease of native coronary artery without angina pectoris: Secondary | ICD-10-CM

## 2021-05-24 DIAGNOSIS — E43 Unspecified severe protein-calorie malnutrition: Secondary | ICD-10-CM | POA: Insufficient documentation

## 2021-05-24 DIAGNOSIS — Z7901 Long term (current) use of anticoagulants: Secondary | ICD-10-CM

## 2021-05-24 DIAGNOSIS — I252 Old myocardial infarction: Secondary | ICD-10-CM

## 2021-05-24 DIAGNOSIS — I130 Hypertensive heart and chronic kidney disease with heart failure and stage 1 through stage 4 chronic kidney disease, or unspecified chronic kidney disease: Secondary | ICD-10-CM

## 2021-05-24 DIAGNOSIS — I509 Heart failure, unspecified: Secondary | ICD-10-CM

## 2021-05-24 LAB — RENAL FUNCTION PANEL
Albumin: 3.4 g/dL — ABNORMAL LOW (ref 3.5–5.0)
Anion gap: 13 (ref 5–15)
BUN: 39 mg/dL — ABNORMAL HIGH (ref 8–23)
CO2: 25 mmol/L (ref 22–32)
Calcium: 9.7 mg/dL (ref 8.9–10.3)
Chloride: 98 mmol/L (ref 98–111)
Creatinine, Ser: 9.84 mg/dL — ABNORMAL HIGH (ref 0.61–1.24)
GFR, Estimated: 5 mL/min — ABNORMAL LOW (ref >60)
Glucose, Bld: 102 mg/dL — ABNORMAL HIGH (ref 70–99)
Phosphorus: 4.7 mg/dL — ABNORMAL HIGH (ref 2.5–4.6)
Potassium: 4.3 mmol/L (ref 3.5–5.1)
Sodium: 136 mmol/L (ref 135–145)

## 2021-05-24 LAB — GLUCOSE, CAPILLARY
Glucose-Capillary: 119 mg/dL — ABNORMAL HIGH (ref 70–99)
Glucose-Capillary: 105 mg/dL — ABNORMAL HIGH (ref 70–99)
Glucose-Capillary: 115 mg/dL — ABNORMAL HIGH (ref 70–99)
Glucose-Capillary: 133 mg/dL — ABNORMAL HIGH (ref 70–99)

## 2021-05-24 LAB — COOXEMETRY PANEL
Carboxyhemoglobin: 1.1 % (ref 0.5–1.5)
Methemoglobin: 0.8 % (ref 0.0–1.5)
O2 Saturation: 61.4 %
Total hemoglobin: 11.6 g/dL — ABNORMAL LOW (ref 12.0–16.0)

## 2021-05-24 LAB — CBC
HCT: 36.8 % — ABNORMAL LOW (ref 39.0–52.0)
Hemoglobin: 12.7 g/dL — ABNORMAL LOW (ref 13.0–17.0)
MCH: 32.1 pg (ref 26.0–34.0)
MCHC: 34.5 g/dL (ref 30.0–36.0)
MCV: 92.9 fL (ref 80.0–100.0)
Platelets: 144 10*3/uL — ABNORMAL LOW (ref 150–400)
RBC: 3.96 MIL/uL — ABNORMAL LOW (ref 4.22–5.81)
RDW: 13.8 % (ref 11.5–15.5)
WBC: 6.8 10*3/uL (ref 4.0–10.5)
nRBC: 0 % (ref 0.0–0.2)

## 2021-05-24 LAB — HEPARIN LEVEL (UNFRACTIONATED): Heparin Unfractionated: 0.62 IU/mL (ref 0.30–0.70)

## 2021-05-24 NOTE — Plan of Care (Signed)

## 2021-05-24 NOTE — Consult Note (Addendum)
Hospital Consult  VASCULAR SURGERY ASSESSMENT & PLAN:   END-STAGE RENAL DISEASE: This patient is right-handed and has a PICC line on the right side.  Therefore, we will plan on placing access in the left arm.  Based on the vein mapping his veins are small and he will likely require placement of an AV graft.  However I have explained to the patient, his son, and his daughter, they are likely so recently dated the time of surgery will need to consider placing a fistula.  I have explained the indications for placement of an AV fistula or AV graft. I've explained that if at all possible we will place an AV fistula.  I have reviewed the risks of placement of an AV fistula including but not limited to: failure of the fistula to mature, need for subsequent interventions, and thrombosis. In addition I have reviewed the potential complications of placement of an AV graft. These risks include, but are not limited to, graft thrombosis, graft infection, wound healing problems, bleeding, arm swelling, and steal syndrome. All the patient's questions were answered and they are agreeable to proceed with surgery.  I would like to remove the IV from the left arm.  His Eliquis is on hold in anticipation of surgery.  He is on IV heparin.  Ruben Gallop, MD 2:10 PM     Reason for Consult:  permanent dialysis access Requesting Physician:  Ruben [name] MRN #:  [id number]  History of Present Illness: This is a 76 y.o. male with multiple medical co morbidities including Afib on Eliquis, ETOH abuse, Tobacco abuse, CHF, CAD, HLD, HTN, CAD with hx MI, and AKI on CKD III. Creatinine has continued to increase since admission. He is anuric and Nephrology feels he will be approaching ESRD. We have been consulted for placement of permanent access. He has initiated dialysis via RIJ [place] placed on 1/17. No prior history of dialysis. He does not have a pacemaker. He does have a RUE PICC line. Denies any prior IV access or central  lines. He is right hand dominant. He has no upper extremity surgical history or limitations.   Past Medical History:  Diagnosis Date   Arthritis    Chronic kidney disease, stage 3b ([place])    ETOH abuse    History of stress test 09/02/2008   showed inferolateral scar without ischemia   Hx of echocardiogram 09/02/2008   was essentially normal   Hyperlipidemia    Hypertension    Tobacco abuse     Past Surgical History:  Procedure Laterality Date   BACK SURGERY     Dr Ruben [name] involving L3-L4 discectomy.   CARDIOVERSION N/A 05/13/2021   Procedure: CARDIOVERSION;  Surgeon: Ruben Dresser, MD;  Location: [facility] ENDOSCOPY;  Service: Cardiovascular;  Laterality: N/A;   HERNIA REPAIR     IR FLUORO GUIDE CV LINE RIGHT  05/21/2021   IR US GUIDE VASC ACCESS RIGHT  05/21/2021   RIGHT/LEFT HEART CATH AND CORONARY ANGIOGRAPHY N/A 05/09/2021   Procedure: RIGHT/LEFT HEART CATH AND CORONARY ANGIOGRAPHY;  Surgeon: Ruben Dresser, MD;  Location: [place] CV LAB;  Service: Cardiovascular;  Laterality: N/A;   SPINE SURGERY     TEE WITHOUT CARDIOVERSION N/A 05/07/2021   Procedure: TRANSESOPHAGEAL ECHOCARDIOGRAM (TEE);  Surgeon: Ruben Dresser, MD;  Location: [facility] ENDOSCOPY;  Service: Cardiovascular;  Laterality: N/A;   TEE WITHOUT CARDIOVERSION N/A 05/13/2021   Procedure: TRANSESOPHAGEAL ECHOCARDIOGRAM (TEE);  Surgeon: Ruben Dresser, MD;  Location: [facility] ENDOSCOPY;  Service: Cardiovascular;  Laterality: N/A;    Allergies  Allergen Reactions   Other Other (See Comments)    Pollen - unknown    Prior to Admission medications   Medication Sig Start Date End Date Taking? Authorizing Provider  acetaminophen (TYLENOL) 650 MG CR tablet Take 650 mg by mouth every 8 (eight) hours as needed for pain.   Yes [provider]  allopurinol (ZYLOPRIM) 100 MG tablet TAKE 1 TABLET(100 MG) BY MOUTH TWICE DAILY Patient taking differently: Take 100 mg by mouth 2 (two) times daily. 04/15/21  Yes Ruben Brine, FNP   aspirin 81 MG tablet Take 81 mg by mouth daily.   Yes [provider]  buPROPion (WELLBUTRIN XL) 150 MG 24 hr tablet Take 1 tablet (150 mg total) by mouth every morning. 01/23/20 04/29/21 Yes Ruben Brine, FNP  cholecalciferol (VITAMIN D3) 25 MCG (1000 UNIT) tablet Take 1,000 Units by mouth daily.   Yes [provider]  diclofenac Sodium (VOLTAREN) 1 % GEL Apply 2 g topically 4 (four) times daily. Patient taking differently: Apply 2 g topically daily as needed (pain). 10/24/19  Yes Ruben Brine, FNP  linaCLOtide (LINZESS PO) Take 1 tablet by mouth daily. Not sure about mg; got samples from doctor   Yes [provider]  Magnesium 200 MG TABS Take 1 tablet (200 mg total) by mouth daily. With evening meal Patient taking differently: Take 200 mg by mouth daily. 10/22/20  Yes Ruben Brine, FNP  methocarbamol (ROBAXIN) 500 MG tablet TAKE 1 TABLET(500 MG) BY MOUTH EVERY 8 HOURS AS NEEDED FOR MUSCLE SPASMS Patient taking differently: Take 500 mg by mouth every 8 (eight) hours as needed for muscle spasms. 10/22/20  Yes Ruben Brine, FNP  olmesartan (BENICAR) 40 MG tablet Take 1 tablet (40 mg total) by mouth daily. 02/19/21 02/19/22 Yes Ruben Brine, FNP  simvastatin (ZOCOR) 40 MG tablet Take 1 tablet (40 mg total) by mouth every evening. 10/22/20  Yes Ruben Brine, FNP  tadalafil (CIALIS) 5 MG tablet Take 1 tablet (5 mg total) by mouth as needed for erectile dysfunction. Patient taking differently: Take 5 mg by mouth daily as needed for erectile dysfunction. 12/19/20  Yes Ruben Brine, FNP  albuterol (PROVENTIL) (2.5 MG/3ML) 0.083% nebulizer solution Take 3 mLs (2.5 mg total) by nebulization every 4 (four) hours as needed for wheezing or shortness of breath. Patient not taking: Reported on 04/29/2021 04/03/21 04/03/22  Ruben Brine, FNP  Meloxicam 15 MG TBDP Take 1 tablet by mouth daily. For 5 days then take once a day as needed Patient not taking: Reported on 04/29/2021  02/18/21   Ruben Brine, FNP  polyethylene glycol powder (GLYCOLAX/MIRALAX) powder Take 17 g by mouth 2 (two) times daily as needed. Patient not taking: Reported on 04/29/2021 03/04/16   McVey, Gelene Mink, PA-C    Social History   Socioeconomic History   Marital status: Single    Spouse name: Not on file   Number of children: Not on file   Years of education: Not on file   Highest education level: Not on file  Occupational History   Not on file  Tobacco Use   Smoking status: Some Days    Packs/day: 1.50    Years: 20.00    Pack years: 30.00    Types: Cigarettes   Smokeless tobacco: Never   Tobacco comments:    down to 2 packs per week; 3/21 - down to 1 Pack per week  Substance and Sexual Activity   Alcohol use: No  Alcohol/week: 0.0 standard drinks   Drug use: No   Sexual activity: Not on file  Other Topics Concern   Not on file  Social History Narrative   Not on file   Social Determinants of Health   Financial Resource Strain: Medium Risk   Difficulty of Paying Living Expenses: Somewhat hard  Food Insecurity: No Food Insecurity   Worried About Running Out of Food in the Last Year: Never true   Ran Out of Food in the Last Year: Never true  Transportation Needs: No Transportation Needs   Lack of Transportation (Medical): No   Lack of Transportation (Non-Medical): No  Physical Activity: Not on file  Stress: Not on file  Social Connections: Not on file  Intimate Partner Violence: Not on file     Family History  Problem Relation Age of Onset   Heart attack Brother    Diabetes Brother    Heart disease Brother    Cancer Father    Diabetes Brother    Heart disease Brother     ROS: Otherwise negative unless mentioned in HPI  Physical Examination  Vitals:   05/24/21 0811 05/24/21 0841  BP: (!) 97/54 (!) 92/53  Pulse: 64 62  Resp: 20   Temp: 98.2 F (36.8 C)   SpO2: 94%    Body mass index is 22.05 kg/m.  General:  WDWN in NAD, seen in HD  unit Gait: Not observed HENT: WNL, normocephalic Pulmonary: normal non-labored breathing, without wheezing Cardiac: regular, without  Murmurs Abdomen: flat, soft, ND Vascular Exam/Pulses: 2+ radial pulses bilaterally, hands warm and well perfused. 5/5 grip strength bilaterally Extremities: without ischemic changes, without Gangrene , without cellulitis; without open wounds;  Musculoskeletal: no muscle wasting or atrophy  Neurologic: A&O X 3;  No focal weakness or paresthesias are detected; speech is fluent/normal Psychiatric:  The pt has  affect.  CBC    Component Value Date/Time   WBC 6.8 05/24/2021 0400   RBC 3.96 (L) 05/24/2021 0400   HGB 12.7 (L) 05/24/2021 0400   HGB 15.3 07/23/2020 1049   HCT 36.8 (L) 05/24/2021 0400   HCT 43.9 07/23/2020 1049   PLT 144 (L) 05/24/2021 0400   PLT 145 (L) 07/23/2020 1049   MCV 92.9 05/24/2021 0400   MCV 92 07/23/2020 1049   MCH 32.1 05/24/2021 0400   MCHC 34.5 05/24/2021 0400   RDW 13.8 05/24/2021 0400   RDW 13.3 07/23/2020 1049   LYMPHSABS 1.3 04/29/2021 1251   MONOABS 0.3 04/29/2021 1251   EOSABS 0.0 04/29/2021 1251   BASOSABS 0.0 04/29/2021 1251    BMET    Component Value Date/Time   NA 136 05/24/2021 0400   NA 141 02/18/2021 1148   K 4.3 05/24/2021 0400   CL 98 05/24/2021 0400   CO2 25 05/24/2021 0400   GLUCOSE 102 (H) 05/24/2021 0400   BUN 39 (H) 05/24/2021 0400   BUN 28 (H) 02/18/2021 1148   CREATININE 9.84 (H) 05/24/2021 0400   CALCIUM 9.7 05/24/2021 0400   GFRNONAA 5 (L) 05/24/2021 0400   GFRAA 48 (L) 01/23/2020 1141    COAGS: Lab Results  Component Value Date   INR 1.8 (H) 05/12/2021   INR 1.1 04/29/2021     Non-Invasive Vascular Imaging:   +-----------------+-------------+----------+--------------+   Right Cephalic    Diameter (cm) Depth (cm)    Findings      +-----------------+-------------+----------+--------------+   Shoulder  not visualized    +-----------------+-------------+----------+--------------+   Prox upper arm                             not visualized   +-----------------+-------------+----------+--------------+   Mid upper arm                              not visualized   +-----------------+-------------+----------+--------------+   Dist upper arm        0.13         0.31                     +-----------------+-------------+----------+--------------+   Antecubital fossa     0.18         0.24                     +-----------------+-------------+----------+--------------+   Prox forearm          0.28         0.34                     +-----------------+-------------+----------+--------------+   Mid forearm           0.23         0.20                     +-----------------+-------------+----------+--------------+   Dist forearm          0.21         0.14                     +-----------------+-------------+----------+--------------+   Wrist                 0.21         0.13                     +-----------------+-------------+----------+--------------+   +-----------------+-------------+----------+----------------------------+   Right Basilic     Diameter (cm) Depth (cm)           Findings             +-----------------+-------------+----------+----------------------------+   Prox upper arm                             not visualized and picc line   +-----------------+-------------+----------+----------------------------+   Mid upper arm                              not visualized and picc line   +-----------------+-------------+----------+----------------------------+   Dist upper arm        0.29         0.59                                   +-----------------+-------------+----------+----------------------------+   Antecubital fossa     0.24         0.45                                   +-----------------+-------------+----------+----------------------------+   Prox forearm          0.16  0.16                                    +-----------------+-------------+----------+----------------------------+   Mid forearm           0.15         0.18                                   +-----------------+-------------+----------+----------------------------+   Distal forearm                                    not visualized          +-----------------+-------------+----------+----------------------------+   +-----------------+-------------+----------+--------+   Left Cephalic     Diameter (cm) Depth (cm) Findings   +-----------------+-------------+----------+--------+   Shoulder              0.33         0.76               +-----------------+-------------+----------+--------+   Prox upper arm        0.23         0.37               +-----------------+-------------+----------+--------+   Mid upper arm         0.22         0.31               +-----------------+-------------+----------+--------+   Dist upper arm        0.22         0.48               +-----------------+-------------+----------+--------+   Antecubital fossa     0.31         0.18               +-----------------+-------------+----------+--------+   Prox forearm          0.22         0.22               +-----------------+-------------+----------+--------+   Mid forearm           0.21         0.23               +-----------------+-------------+----------+--------+   Dist forearm          0.21         0.13               +-----------------+-------------+----------+--------+   +-----------------+-------------+----------+---------+   Left Basilic      Diameter (cm) Depth (cm) Findings    +-----------------+-------------+----------+---------+   Prox upper arm        0.31         0.52                +-----------------+-------------+----------+---------+   Mid upper arm         0.21         0.18    branching   +-----------------+-------------+----------+---------+   Dist upper arm        0.25         0.18                 +-----------------+-------------+----------+---------+   Antecubital fossa  0.24         0.26                +-----------------+-------------+----------+---------+   Prox forearm          0.27         0.13                +-----------------+-------------+----------+---------+   Mid forearm           0.20         0.11                +-----------------+-------------+----------+---------+   Distal forearm        0.23         0.14                +-----------------+-------------+----------+---------+   Statin:  Yes.   Beta Blocker:  No. Aspirin:  No. ACEI:  No. ARB:  No. CCB use:  Yes Other antiplatelets/anticoagulants:  Yes.   Heparin gtt   ASSESSMENT/PLAN: This is a 76 y.o. male with multiple medical co morbidities including AKI on CKD stage III felt to be progressing to ESRD. We have been asked to place permanent dialysis access. Patient currently is being dialyzed via a right IJ TDC. Vein mapping does not show adequate conduit for fistula in either upper extremity. Patient is right handed. He is on Eliquis at home for afib. Currently on Hep gtt pending surgery. Patient overall overwhelmed by everything going on with him medically. He does not  have any questions but has asked that this be discussed with his son and daughter. He will be indicated for a left upper extremity AV Graft. Risk, benefits, and alternatives to access surgery were discussed. The on call vascular surgeon, Dr Scot Dock, will see patient later today and can further discuss with the patient and his children about access surgery and timing of this.   Karoline Caldwell PA-C [facility] [phone] 05/24/2021  9:40 AM

## 2021-05-24 NOTE — Progress Notes (Signed)
[place] for Eliquis > Heparin Indication: atrial fibrillation  Allergies  Allergen Reactions   Other Other (See Comments)    Pollen - unknown    Patient Measurements: Height: 5\' 11"  (180.3 cm) Weight: 70.2 kg (154 lb 12.2 oz) IBW/kg (Calculated) : 75.3 Heparin Dosing Weight: 78 kg  Vital Signs: Temp: 98 F (36.7 C) (01/20 1539) Temp Source: Oral (01/20 1539) BP: 99/53 (01/20 1539) Pulse Rate: 63 (01/20 1539)  Labs: Recent Labs    05/22/21 0434 05/22/21 0435 05/23/21 0430 05/24/21 0400  HGB 12.6*  --  13.1 12.7*  HCT 36.7*  --  37.3* 36.8*  PLT 140*  --  140* 144*  APTT 78*  --  82*  --   HEPARINUNFRC  --  0.79* 0.63 0.62  CREATININE  --  8.25* 7.30* 9.84*     Estimated Creatinine Clearance: 6.4 mL/min (A) (by C-G formula based on SCr of 9.84 mg/dL (H)).   Assessment: 76 yo male on chronic Eliquis for afib, now initiated on CRRT and pharmacy asked to transition Eliquis to IV heparin.     Recent DCCV 1/9 but went back into AF. Of note, last dose of Eliquis given 1/11 at 2200 pm. Heparin level affected by Eliquis so utilizing aPTT for monitoring until levels correlate.  Heparin level 0.6  - therapeutic at on heparin drip 700 units/hr. No s/sx of bleeding or infusion issues.    Goal of Therapy:  Heparin level 0.3-0.7 units/ml; Monitor platelets by anticoagulation protocol: Yes   Plan:  Continue heparin to 700 units/hr - Daily heparin level and CBC Will transition to oral anticoagulation when procedures complete    Bonnita Nasuti Pharm.D. CPP, BCPS Clinical Pharmacist [phone] 05/24/2021 3:55 PM    Please check AMION for all [place] phone numbers After 10:00 PM, call [place] [phone]

## 2021-05-24 NOTE — Progress Notes (Addendum)
Patient ID: Ruben [name], male   DOB: 18-Mar-1946, 76 y.o.   MRN: [id number]     Advanced Heart Failure Rounding Note  PCP-Cardiologist: Sanda Klein, MD   Subjective:   12/26: Admit with late presentation MI. New AF 12/28: CGS initial co-ox 47%. Started NE + mil. 01/03: SR 05/08/20 Back in A fib and milrinone was cut back to 0.125 mcg.  05/09/20 Milrinone increased back to 0.25. Volume overload, elevated filling pressures on RHC with preserved CO on milrinone. Hypertensive. Back in ICU on nitro gtt.  LHC 1/5: occluded RCA (culprit), diffuse disease in LAD and Lcx. Seen by CVTS. Not felt to be a candidate for surgery. 1/9: S/P TEE followed by DC/CV -->SR.  1/11: Afib w/ RVR>>amio gtt started 1/11: Moved to ICU for CVVH  1/13: Stopped CVVH 1/14: iHD 1/15: DBA stopped. Given IV lasix with 10 cc urine output.  1/16: iHD 1/18: iHD  Co-ox remains stable off DBA, 61% today. CVP 3. Remains anuric. Scheduled for iHD today. Has TDC. Remains on heparin gtt until permanent HD access finalized. Maintaining NSR.   Denies CP. No dyspnea. Sitting up eating breakfast but appetite not great. Wife at bedside.    Objective:   Weight Range: 72 kg Body mass index is 22.14 kg/m.   Vital Signs:   Temp:  [97.8 F (36.6 C)-98.2 F (36.8 C)] 98.2 F (36.8 C) (01/20 0623) Pulse Rate:  [58-66] 63 (01/20 0403) Resp:  [10-20] 20 (01/20 0623) BP: (96-107)/(51-61) 98/59 (01/20 0403) SpO2:  [92 %-99 %] 95 % (01/20 0403) Weight:  [72 kg] 72 kg (01/20 0403) Last BM Date: 05/23/21  Weight change: Filed Weights   05/22/21 0755 05/23/21 0401 05/24/21 0403  Weight: 71.7 kg 71 kg 72 kg    Intake/Output:   Intake/Output Summary (Last 24 hours) at 05/24/2021 0709 Last data filed at 05/23/2021 0932 Gross per 24 hour  Intake 240 ml  Output --  Net 240 ml      Physical Exam   CVP 3  General:  Well appearing. No respiratory difficulty HEENT: normal Neck: supple. no JVD. Carotids 2+ bilat; no  bruits. No lymphadenopathy or thyromegaly appreciated. Cor: PMI nondisplaced. Regular rate & rhythm. 2/6 HSM at apex [facility] rt upper chest  Lungs: clear Abdomen: soft, nontender, nondistended. No hepatosplenomegaly. No bruits or masses. Good bowel sounds. Extremities: no cyanosis, clubbing, rash, edema Neuro: alert & oriented x 3, cranial nerves grossly intact. moves all 4 extremities w/o difficulty. Affect pleasant.   Telemetry   NSR 60s (personally reviewed)  Labs    CBC Recent Labs    05/23/21 0430 05/24/21 0400  WBC 6.6 6.8  HGB 13.1 12.7*  HCT 37.3* 36.8*  MCV 91.2 92.9  PLT 140* 253*   Basic Metabolic Panel Recent Labs    05/23/21 0430 05/24/21 0400  NA 137 136  K 4.2 4.3  CL 100 98  CO2 26 25  GLUCOSE 107* 102*  BUN 25* 39*  CREATININE 7.30* 9.84*  CALCIUM 9.3 9.7  PHOS 3.7 4.7*   Liver Function Tests Recent Labs    05/23/21 0430 05/24/21 0400  ALBUMIN 3.4* 3.4*    No results for input(s): LIPASE, AMYLASE in the last 72 hours. Cardiac Enzymes No results for input(s): CKTOTAL, CKMB, CKMBINDEX, TROPONINI in the last 72 hours.  BNP: BNP (last 3 results) Recent Labs    04/29/21 1251  BNP 1,659.6*    ProBNP (last 3 results) No results for input(s): PROBNP in the last 8760  hours.   D-Dimer No results for input(s): DDIMER in the last 72 hours. Hemoglobin A1C No results for input(s): HGBA1C in the last 72 hours. Fasting Lipid Panel No results for input(s): CHOL, HDL, LDLCALC, TRIG, CHOLHDL, LDLDIRECT in the last 72 hours. Thyroid Function Tests No results for input(s): TSH, T4TOTAL, T3FREE, THYROIDAB in the last 72 hours.  Invalid input(s): FREET3  Other results:   Imaging    No results found.   Medications:     Scheduled Medications:  amiodarone  200 mg Oral BID   atorvastatin  80 mg Oral Daily   Chlorhexidine Gluconate Cloth  6 each Topical V5643   folic acid  1 mg Oral Daily   mouth rinse  15 mL Mouth Rinse BID   midodrine   10 mg Oral TID WC   multivitamin  1 tablet Oral QHS   ondansetron  4 mg Oral TID WC   senna-docusate  1 tablet Oral BID   sodium chloride flush  10-40 mL Intracatheter Q12H   sodium chloride flush  3 mL Intravenous Q12H   thiamine  100 mg Oral Daily    Infusions:  sodium chloride     sodium chloride     sodium chloride     heparin 700 Units/hr (05/23/21 0258)    PRN Medications: sodium chloride, sodium chloride, sodium chloride, acetaminophen, ALPRAZolam, alteplase, benzonatate, heparin, lidocaine (PF), lidocaine-prilocaine, ondansetron (ZOFRAN) IV, pentafluoroprop-tetrafluoroeth, pentafluoroprop-tetrafluoroeth, polyethylene glycol, sodium chloride flush, sodium chloride flush    Assessment/Plan   1. CAD: Suspect delayed presentation LCx (versus RCA) MI.  Echo with EF 35-40%, inferolateral and basal inferior akinesis, moderate-severe eccentric likely infarct-related MR, normal RV.  He was not cathed initially due to AKI and delayed presentation. Eventual LHC with occluded RCA (culprit), diffuse moderate disease in LAD and Lcx. No good PCI targets. Not a candidate for CABG. No chest pain.  - Continue statin - Anticoagulated.  2. Acute systolic CHF/cardiogenic shock: Ischemic cardiomyopathy with infarct-related MR. Echo with EF 35-40%, inferolateral and basal inferior akinesis, moderate-severe eccentric likely infarct-related MR, normal RV.  Echo does not look bad enough to explain low output HF, concerned that MR may be playing a role.  TEE on 01/09 with EF 35-40%, moderate MR (suspect infarct-related), aortic dilatation measuring 44 mm, moderate AI. RHC on 0.25 milrinone showed RA mean 16, PA 66/26 (41), PCWP 27, PA 62%, Fick CO 5.04/ CI 2.56.  He was weaned off pressors but restarted dobutamine with persistently low co-ox.   CVVH stopped on 1/13 but started iHD. At this point, looks like he will need iHD ongoing. Co-ox 61% today off dobutamine.  - Continue midodrine 10 tid.  - HD for  volume management.  3. Hyponatremia: Managed via HD.  4. Valvular heart disease:  MR moderate in severity on TEE. Likely infarct related. Moderate AI.  5. Atrial fibrillation: First noted this admission.  S/P TEE followed DC-CV--> NSR.  In and out of AF, currently NSR.  Hopefully will hold NSR better off dobutamine.  - Continue amiodarone 200 mg twice a day.  - Heparin gtt until access finalized then Eliquis.  6. AKI on CKD 3: Baseline creatinine around 1.4 in 6/22. Peak at 3.09 and trended down, now 2.2 -> 2.6 -> 3.1 > 4.0. > 5.3 > 6.58 >7.26 > CVVH started. 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 Renal US with no hydronephrosis. CVVH started 1/11 and stopped 1/13.  However, minimal UOP and creatinine rose so started iHD on 1/14.  Suspect he will need iHD  for now.   Still with minimal UOP.  - Next HD today 7. Active smoker: Needs to quit.  8. ETOH abuse: Needs to cut back.  9. Thrombocytopenia: Mild, pre-dates heparin initiation.  10. Hyperglycemia: - Continue SSI  - A1c 5.7  Planning HD again today. Ready for home from cardiac standpoint.  Will follow nephrology's lead at this point.  Needs to transition to Eliquis when no more procedures anticipated.   Lyda Jester, PA-C  05/24/2021 7:09 AM  Agree with the above PA note.   Stable from cardiac standpoint.  No chest pain.  Denies dyspnea. Co-ox 60% and CVP 3.  Making minimal urine.   General: NAD Neck: No JVD, no thyromegaly or thyroid nodule.  Lungs: Clear to auscultation bilaterally with normal respiratory effort. CV: Nondisplaced PMI.  Heart regular S1/S2, no S3/S4, 2/6 HSM apex.  No peripheral edema.   Abdomen: Soft, nontender, no hepatosplenomegaly, no distention.  Skin: Intact without lesions or rashes.  Neurologic: Alert and oriented x 3.  Psych: Normal affect. Extremities: No clubbing or cyanosis.  HEENT: Normal.   Patient remains in NSR on po amiodarone.   He will need ongoing iHD.  Renal working on outpatient dialysis seat.  Plan for  AV fistula placement by VVS on Monday.  After that, he can start on Eliquis 5 mg bid and go home.   Loralie Champagne 05/24/2021 6:27 PM

## 2021-05-24 NOTE — Progress Notes (Signed)
Pt has been accepted at [facility] on a MWF schedule. Pt can start as soon as Monday if needed. For first appt, pt needs to arrive at 10:15 to complete paperwork prior to 11:00 chair time. After first appt, pt can arrive 10:30-10:45. Met with pt at bedside to discuss above arrangements. Schedule letter provided to pt and documented arrangements on AVS as well. Pt requested that navigator contact pt's dtr, Reba, to discuss the above. Contacted dtr via phone. Dtr did not answer and unable to leave a message. Will continue efforts to reach pt's dtr. Update provided to attending, nephrologist, and CSW via secure chat. Will assist as needed.   Melven Sartorius Renal Navigator [phone]

## 2021-05-24 NOTE — Progress Notes (Signed)
[facility] Progress Note    Assessment/ Plan:    AKI on CKD 3b:normally sees Dr Posey Pronto.  On ARB as OP.  He has had both significant hyper- and hypotensive episodes during this hospitalization requiring treatment and a contrast load on 05/09/21.  Renal US without obstruction and R 6 mm renal calculus.  UA on admission bland.AKI component suspected ATN + CIN combination + cardiorenal sydrome - CRRT 05/15/21-05/17/21 now tolerating iHD with midodrine 10 TID support.  Last HD 1/18 1L UF.  Next HD tomorrow . - he's had intermittent UOP but not reliably -Had [facility] placed 1/17.   - At this point still AKI but given anuric status and moderate underlying CKD I think it's time for perm access placement.  Consulting VVS.   Vein mapping completed.    2.  Acute on chronic systolic CHF: EF 16-10%, previously on inotropes, now on midodrine and off  dobutamine.  CHF managing.    3.  CAD - LHC 05/09/21, 3V disease - not thought to be a candidate for surgery per CVTS   4.  Late-presenting MI - trops peaked > 20,000   5.  Afib: - s/p DCCV 05/13/21 - PO amiodarone - Eliquis --> hep gtt for now given access surgery.   6.  MR: thought to be infarct related  7.  Dispo: inpatient but making progress towards disposition.    Subjective:    Now in stepdown.  Cardiac rehab underway.  No new issues.  Not great po intake - RD working on some issues.  Seen at HD today - no new issues reported.     Objective:   BP (!) 92/53    Pulse 62    Temp 98.2 F (36.8 C) (Oral)    Resp 20    Ht 5\' 11"  (1.803 m)    Wt 71.7 kg    SpO2 94%    BMI 22.05 kg/m   Intake/Output Summary (Last 24 hours) at 05/24/2021 0913 Last data filed at 05/24/2021 0800 Gross per 24 hour  Intake 600 ml  Output --  Net 600 ml    Weight change: 0.3 kg  Physical Exam: GEN lying flat in bed, appears relatively comfortable HEENT no jaundice PULM clear CV RRR ABD soft EXT edema resolved ACCESS: R IJ TDC c/d/i  Imaging: No  results found.  Labs: BMET Recent Labs  Lab 05/17/21 1639 05/18/21 0339 05/19/21 0523 05/21/21 0449 05/22/21 0435 05/23/21 0430 05/24/21 0400  NA 137 133* 135 131* 132* 137 136  K 4.8 4.6 4.2 4.3 4.2 4.2 4.3  CL 101 99 96* 96* 99 100 98  CO2 25 25 26 24 22 26 25   GLUCOSE 146* 179* 109* 93 102* 107* 102*  BUN 9 14 10 19  37* 25* 39*  CREATININE 2.71* 3.75* 3.76* 6.31* 8.25* 7.30* 9.84*  CALCIUM 8.6* 8.3* 8.9 9.2 8.8* 9.3 9.7  PHOS 1.6* 1.9* 2.2* 3.3 4.4 3.7 4.7*    CBC Recent Labs  Lab 05/21/21 0449 05/22/21 0434 05/23/21 0430 05/24/21 0400  WBC 7.0 7.3 6.6 6.8  HGB 13.3 12.6* 13.1 12.7*  HCT 38.5* 36.7* 37.3* 36.8*  MCV 93.4 93.1 91.2 92.9  PLT 145* 140* 140* 144*     Medications:     amiodarone  200 mg Oral BID   atorvastatin  80 mg Oral Daily   Chlorhexidine Gluconate Cloth  6 each Topical R6045   folic acid  1 mg Oral Daily   mouth rinse  15 mL Mouth  Rinse BID   midodrine  10 mg Oral TID WC   multivitamin  1 tablet Oral QHS   ondansetron  4 mg Oral TID WC   senna-docusate  1 tablet Oral BID   sodium chloride flush  10-40 mL Intracatheter Q12H   sodium chloride flush  3 mL Intravenous Q12H   thiamine  100 mg Oral Daily    Jannifer Hick MD [facility] Pager [phone]

## 2021-05-24 NOTE — TOC CM/SW Note (Signed)
Transition of Care [facility]) Benefit Eligibility Note      Patient Details  Name: Hady Niemczyk MRN: [id number] Date of Birth: Jul 08, 1945     Medication/Dose: Arne Cleveland  2.5 MG BID : CO-PAY- $47.00   and   ELIQUIS  5 MG  ID : CO-PAY- $47.00   Covered?: Yes   Tier: 3 Drug   Prescription Coverage Preferred Pharmacy: Roseanne Kaufman with Person/Company/Phone Number:: [facility]   @  [place] #  [phone]   Co-Pay: $ 47.00   Prior Approval: No   Deductible: Met (OUT-OF-POCKET : UNMET)   Additional Notes: ELIQUIS  10 MG  : NON-FORMULARY

## 2021-05-24 NOTE — Progress Notes (Signed)
°   05/24/21 0403  Assess: MEWS Score  Temp 98.2 F (36.8 C)  BP (!) 98/59  Pulse Rate 63  ECG Heart Rate 64  Resp 10  Level of Consciousness Alert  SpO2 95 %  O2 Device Room Air  Assess: MEWS Score  MEWS Temp 0  MEWS Systolic 1  MEWS Pulse 0  MEWS RR 1  MEWS LOC 0  MEWS Score 2  MEWS Score Color Yellow  Assess: if the MEWS score is Yellow or Red  Were vital signs taken at a resting state? Yes  Focused Assessment No change from prior assessment  Early Detection of Sepsis Score *See Row Information* Low  MEWS guidelines implemented *See Row Information* No, vital signs rechecked  Treat  Pain Scale 0-10  Pain Score 0  Notify: Charge Nurse/RN  Name of Charge Nurse/RN Notified Vinnie Level, RN  Date Charge Nurse/RN Notified 05/24/21  Time Charge Nurse/RN Notified 0600

## 2021-05-24 NOTE — TOC CM/SW Note (Signed)
HF TOC CM contacted [facility] and they do have Eliquis in stock. Provided pt with Eliquis 30 day free trial card for possible dc home this weekend. [place], Heart Failure TOC CM [phone]

## 2021-05-25 DIAGNOSIS — I4891 Unspecified atrial fibrillation: Secondary | ICD-10-CM

## 2021-05-25 LAB — RENAL FUNCTION PANEL
Albumin: 3.7 g/dL (ref 3.5–5.0)
Anion gap: 13 (ref 5–15)
BUN: 31 mg/dL — ABNORMAL HIGH (ref 8–23)
CO2: 25 mmol/L (ref 22–32)
Calcium: 9.6 mg/dL (ref 8.9–10.3)
Chloride: 95 mmol/L — ABNORMAL LOW (ref 98–111)
Creatinine, Ser: 8.61 mg/dL — ABNORMAL HIGH (ref 0.61–1.24)
GFR, Estimated: 6 mL/min — ABNORMAL LOW (ref >60)
Glucose, Bld: 99 mg/dL (ref 70–99)
Phosphorus: 5.1 mg/dL — ABNORMAL HIGH (ref 2.5–4.6)
Potassium: 4.2 mmol/L (ref 3.5–5.1)
Sodium: 133 mmol/L — ABNORMAL LOW (ref 135–145)

## 2021-05-25 LAB — CBC
HCT: 38.5 % — ABNORMAL LOW (ref 39.0–52.0)
Hemoglobin: 13.3 g/dL (ref 13.0–17.0)
MCH: 32.2 pg (ref 26.0–34.0)
MCHC: 34.5 g/dL (ref 30.0–36.0)
MCV: 93.2 fL (ref 80.0–100.0)
Platelets: 138 10*3/uL — ABNORMAL LOW (ref 150–400)
RBC: 4.13 MIL/uL — ABNORMAL LOW (ref 4.22–5.81)
RDW: 13.9 % (ref 11.5–15.5)
WBC: 6.4 10*3/uL (ref 4.0–10.5)
nRBC: 0 % (ref 0.0–0.2)

## 2021-05-25 LAB — COOXEMETRY PANEL
Carboxyhemoglobin: 0.9 % (ref 0.5–1.5)
Methemoglobin: 0.9 % (ref 0.0–1.5)
O2 Saturation: 65.6 %
Total hemoglobin: 13.4 g/dL (ref 12.0–16.0)

## 2021-05-25 LAB — GLUCOSE, CAPILLARY
Glucose-Capillary: 105 mg/dL — ABNORMAL HIGH (ref 70–99)
Glucose-Capillary: 134 mg/dL — ABNORMAL HIGH (ref 70–99)
Glucose-Capillary: 98 mg/dL (ref 70–99)
Glucose-Capillary: 111 mg/dL — ABNORMAL HIGH (ref 70–99)

## 2021-05-25 LAB — HEPARIN LEVEL (UNFRACTIONATED)
Heparin Unfractionated: >1.1 IU/mL — ABNORMAL HIGH (ref 0.30–0.70)
Heparin Unfractionated: 0.58 IU/mL (ref 0.30–0.70)

## 2021-05-25 NOTE — Progress Notes (Signed)
Patient ID: Ruben [name], male   DOB: 1946-02-18, 76 y.o.   MRN: [id number]     Cardiology Rounding Note  PCP-Cardiologist: Sanda Klein, MD   Subjective:   12/26: Admit with late presentation MI. New AF 12/28: CGS initial co-ox 47%. Started NE + mil. 01/03: SR 05/08/20 Back in A fib and milrinone was cut back to 0.125 mcg.  05/09/20 Milrinone increased back to 0.25. Volume overload, elevated filling pressures on RHC with preserved CO on milrinone. Hypertensive. Back in ICU on nitro gtt.  LHC 1/5: occluded RCA (culprit), diffuse disease in LAD and Lcx. Seen by CVTS. Not felt to be a candidate for surgery. 1/9: S/P TEE followed by DC/CV -->SR.  1/11: Afib w/ RVR>>amio gtt started 1/11: Moved to ICU for CVVH  1/13: Stopped CVVH 1/14: iHD 1/15: DBA stopped. Given IV lasix with 10 cc urine output.  1/16: iHD 1/18: iHD  Co-ox remains stable off DBA, 66% today.  Maintaining NSR.  Denies any chest pain or dyspnea   Objective:   Weight Range: 70.2 kg Body mass index is 21.59 kg/m.   Vital Signs:   Temp:  [97.8 F (36.6 C)-98.4 F (36.9 C)] 97.8 F (36.6 C) (01/21 0746) Pulse Rate:  [54-64] 60 (01/21 0746) Resp:  [12-24] 16 (01/21 0746) BP: (92-127)/(52-59) 100/54 (01/21 0746) SpO2:  [91 %-96 %] 93 % (01/21 0746) Weight:  [70.2 kg] 70.2 kg (01/21 0641) Last BM Date: 05/23/21  Weight change: Filed Weights   05/24/21 0820 05/24/21 1200 05/25/21 0641  Weight: 71.7 kg 70.2 kg 70.2 kg    Intake/Output:   Intake/Output Summary (Last 24 hours) at 05/25/2021 1036 Last data filed at 05/24/2021 2130 Gross per 24 hour  Intake 480 ml  Output 1500 ml  Net -1020 ml       Physical Exam   General:  Well appearing. No respiratory difficulty HEENT: normal Neck: supple. no JVD.  Cor: Regular rate & rhythm. 2/6 systolic murmur Lungs: clear Abdomen: soft, nontender, nondistended.  Extremities: no edema Neuro: alert & oriented x 3   Telemetry   NSR 50-60s (personally  reviewed)  Labs    CBC Recent Labs    05/24/21 0400 05/25/21 0500  WBC 6.8 6.4  HGB 12.7* 13.3  HCT 36.8* 38.5*  MCV 92.9 93.2  PLT 144* 138*    Basic Metabolic Panel Recent Labs    05/24/21 0400 05/25/21 0500  NA 136 133*  K 4.3 4.2  CL 98 95*  CO2 25 25  GLUCOSE 102* 99  BUN 39* 31*  CREATININE 9.84* 8.61*  CALCIUM 9.7 9.6  PHOS 4.7* 5.1*    Liver Function Tests Recent Labs    05/24/21 0400 05/25/21 0500  ALBUMIN 3.4* 3.7     No results for input(s): LIPASE, AMYLASE in the last 72 hours. Cardiac Enzymes No results for input(s): CKTOTAL, CKMB, CKMBINDEX, TROPONINI in the last 72 hours.  BNP: BNP (last 3 results) Recent Labs    04/29/21 1251  BNP 1,659.6*     ProBNP (last 3 results) No results for input(s): PROBNP in the last 8760 hours.   D-Dimer No results for input(s): DDIMER in the last 72 hours. Hemoglobin A1C No results for input(s): HGBA1C in the last 72 hours. Fasting Lipid Panel No results for input(s): CHOL, HDL, LDLCALC, TRIG, CHOLHDL, LDLDIRECT in the last 72 hours. Thyroid Function Tests No results for input(s): TSH, T4TOTAL, T3FREE, THYROIDAB in the last 72 hours.  Invalid input(s): FREET3  Other results:  Imaging    No results found.   Medications:     Scheduled Medications:  amiodarone  200 mg Oral BID   atorvastatin  80 mg Oral Daily   Chlorhexidine Gluconate Cloth  6 each Topical V8938   folic acid  1 mg Oral Daily   mouth rinse  15 mL Mouth Rinse BID   midodrine  10 mg Oral TID WC   multivitamin  1 tablet Oral QHS   ondansetron  4 mg Oral TID WC   senna-docusate  1 tablet Oral BID   sodium chloride flush  10-40 mL Intracatheter Q12H   sodium chloride flush  3 mL Intravenous Q12H   thiamine  100 mg Oral Daily    Infusions:  sodium chloride     sodium chloride     sodium chloride     heparin 700 Units/hr (05/23/21 0258)    PRN Medications: sodium chloride, sodium chloride, sodium chloride,  acetaminophen, ALPRAZolam, alteplase, benzonatate, heparin, lidocaine (PF), lidocaine-prilocaine, ondansetron (ZOFRAN) IV, pentafluoroprop-tetrafluoroeth, pentafluoroprop-tetrafluoroeth, polyethylene glycol, sodium chloride flush, sodium chloride flush    Assessment/Plan   1. CAD: Suspect delayed presentation LCx (versus RCA) MI.  Echo with EF 35-40%, inferolateral and basal inferior akinesis, moderate-severe eccentric likely infarct-related MR, normal RV.  He was not cathed initially due to AKI and delayed presentation. Eventual LHC with occluded RCA (culprit), diffuse moderate disease in LAD and Lcx. No good PCI targets. Not a candidate for CABG. No chest pain.  - Continue statin - Anticoagulated.  2. Acute systolic CHF/cardiogenic shock: Ischemic cardiomyopathy with infarct-related MR. Echo with EF 35-40%, inferolateral and basal inferior akinesis, moderate-severe eccentric likely infarct-related MR, normal RV.  Echo does not look bad enough to explain low output HF, concerned that MR may be playing a role.  TEE on 01/09 with EF 35-40%, moderate MR (suspect infarct-related), aortic dilatation measuring 44 mm, moderate AI. RHC on 0.25 milrinone showed RA mean 16, PA 66/26 (41), PCWP 27, PA 62%, Fick CO 5.04/ CI 2.56.  He was weaned off pressors but restarted dobutamine with persistently low co-ox.   CVVH stopped on 1/13 but started iHD. At this point, looks like he will need iHD ongoing. Co-ox 66% today off dobutamine.  - Continue midodrine 10 tid.  - HD for volume management.  3. Hyponatremia: Managed via HD.  4. Valvular heart disease:  MR moderate in severity on TEE. Likely infarct related. Moderate AI.  5. Atrial fibrillation: First noted this admission.  S/P TEE followed DC-CV--> NSR.  In and out of AF, currently NSR.  Hopefully will hold NSR better off dobutamine.  - Continue amiodarone 200 mg twice a day.  - Heparin gtt for now given AV fistula placement planned by DVS on Monday.  Can start  Eliquis following graft 6. AKI on CKD 3: Baseline creatinine around 1.4 in 6/22. Peak at 3.09 and trended down, now 2.2 -> 2.6 -> 3.1 > 4.0. > 5.3 > 6.58 >7.26 > CVVH started. 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 Renal US with no hydronephrosis. CVVH started 1/11 and stopped 1/13.  However, minimal UOP and creatinine rose so started iHD on 1/14.  Suspect he will need iHD for now.   Still with minimal UOP.  7. Active smoker: Needs to quit.  8. ETOH abuse: Needs to cut back.  9. Thrombocytopenia: Mild, pre-dates heparin initiation.  10. Hyperglycemia: - Continue SSI  - A1c 5.7   Donato Heinz 05/25/2021 10:36 AM

## 2021-05-25 NOTE — Progress Notes (Signed)
[facility] Progress Note    Assessment/ Plan:    AKI on CKD 3b:normally sees Dr Posey Pronto.  On ARB as OP.  He has had both significant hyper- and hypotensive episodes during this hospitalization requiring treatment and a contrast load on 05/09/21.  Renal US without obstruction and R 6 mm renal calculus.  UA on admission bland.AKI component suspected ATN + CIN combination + cardiorenal sydrome - CRRT 05/15/21-05/17/21 now tolerating iHD with midodrine 10 TID support.  Last HD 1/20 1.5L UF.  Next HD Monday . - he's had intermittent UOP but not reliably -Had [facility] placed 1/17.   - At this point still AKI but given anuric status and moderate underlying CKD consulted [facility]VS for perm access, planned AVG Monday.     2.  Acute on chronic systolic CHF: EF 94-70%, previously on inotropes, now on midodrine and off  dobutamine.  CHF managing.    3.  CAD - LHC 05/09/21, 3V disease - not thought to be a candidate for surgery per CVTS   4.  Late-presenting MI - trops peaked > 20,000   5.  Afib: - s/p DCCV 05/13/21 - PO amiodarone - Eliquis --> hep gtt for now given access surgery.   6.  MR: thought to be infarct related  7.  Dispo: has MWF outpt HD arranged for [facility] when ready for d/c - will need HD and AVG surgery Monday then should be ready to go.   Subjective:    Now in stepdown.  Cardiac rehab underway.  No new issues.  Appetite remains poor.  DIL bedside this AM. HD with UF 1.5L yesterday.      Objective:   BP (!) 100/54 (BP Location: Left Arm)    Pulse 60    Temp 97.8 F (36.6 C) (Oral)    Resp 16    Ht 5\' 11"  (1.803 m)    Wt 70.2 kg    SpO2 93%    BMI 21.59 kg/m   Intake/Output Summary (Last 24 hours) at 05/25/2021 9628 Last data filed at 05/24/2021 2130 Gross per 24 hour  Intake 480 ml  Output 1500 ml  Net -1020 ml    Weight change: -0.3 kg  Physical Exam: GEN lying flat in bed, appears relatively comfortable HEENT no jaundice PULM clear CV RRR ABD soft EXT edema  resolved ACCESS: R IJ TDC c/d/i  Imaging: No results found.  Labs: BMET Recent Labs  Lab 05/19/21 0523 05/21/21 0449 05/22/21 0435 05/23/21 0430 05/24/21 0400 05/25/21 0500  NA 135 131* 132* 137 136 133*  K 4.2 4.3 4.2 4.2 4.3 4.2  CL 96* 96* 99 100 98 95*  CO2 26 24 22 26 25 25   GLUCOSE 109* 93 102* 107* 102* 99  BUN 10 19 37* 25* 39* 31*  CREATININE 3.76* 6.31* 8.25* 7.30* 9.84* 8.61*  CALCIUM 8.9 9.2 8.8* 9.3 9.7 9.6  PHOS 2.2* 3.3 4.4 3.7 4.7* 5.1*    CBC Recent Labs  Lab 05/22/21 0434 05/23/21 0430 05/24/21 0400 05/25/21 0500  WBC 7.3 6.6 6.8 6.4  HGB 12.6* 13.1 12.7* 13.3  HCT 36.7* 37.3* 36.8* 38.5*  MCV 93.1 91.2 92.9 93.2  PLT 140* 140* 144* 138*     Medications:     amiodarone  200 mg Oral BID   atorvastatin  80 mg Oral Daily   Chlorhexidine Gluconate Cloth  6 each Topical Z6629   folic acid  1 mg Oral Daily   mouth rinse  15 mL Mouth Rinse  BID   midodrine  10 mg Oral TID WC   multivitamin  1 tablet Oral QHS   ondansetron  4 mg Oral TID WC   senna-docusate  1 tablet Oral BID   sodium chloride flush  10-40 mL Intracatheter Q12H   sodium chloride flush  3 mL Intravenous Q12H   thiamine  100 mg Oral Daily    Jannifer Hick MD [facility] Pager [phone]

## 2021-05-25 NOTE — Progress Notes (Signed)
CARDIAC REHAB PHASE I   PRE:  Rate/Rhythm: 64 SR  BP:  Sitting: 100/54      SaO2: 93 RA  MODE:  Ambulation: 600 ft   POST:  Rate/Rhythm: 75 SR  BP:  Sitting: 114/59      SaO2: 94 RA  Pt tolerated exercise well and amb 600 ft with independently. Pt denies CP, SOB, or dizziness throughout walk. Ed given to pt and family. Discussed heart healthy diet, low sodium diet, daily weight for fluid retention, shortness of breath, when to get help, and exercise guidelines. Pt left in the bed with call bell in reach. Will continue to follow.  [place], MS, ACSM-CEP 05/25/2021 1:47 PM

## 2021-05-25 NOTE — Progress Notes (Signed)
[place] for Eliquis > Heparin Indication: atrial fibrillation  Allergies  Allergen Reactions   Other Other (See Comments)    Pollen - unknown    Patient Measurements: Height: 5\' 11"  (180.3 cm) Weight: 70.2 kg (154 lb 12.2 oz) IBW/kg (Calculated) : 75.3 Heparin Dosing Weight: 78 kg  Vital Signs: Temp: 98.1 F (36.7 C) (01/21 0304) Temp Source: Oral (01/21 0304) BP: 92/52 (01/21 0304) Pulse Rate: 63 (01/21 0304)  Labs: Recent Labs    05/23/21 0430 05/24/21 0400 05/25/21 0500 05/25/21 0637  HGB 13.1 12.7* 13.3  --   HCT 37.3* 36.8* 38.5*  --   PLT 140* 144* 138*  --   APTT 82*  --   --   --   HEPARINUNFRC 0.63 0.62  --  >1.10*  CREATININE 7.30* 9.84*  --   --      Estimated Creatinine Clearance: 6.4 mL/min (A) (by C-G formula based on SCr of 9.84 mg/dL (H)).   Assessment: 76 yo male on chronic Eliquis for afib, now initiated on CRRT and pharmacy asked to transition Eliquis to IV heparin.     Recent DCCV 1/9 but went back into AF. Of note, last dose of Eliquis given 1/11 at 2200 pm.   Heparin level this morning came back supratherapeutic at >1.1, on 700 units/hr. Peripheral line was removed yesterday so is running centrally and level drawn centrally. Repeat entered for peripheral stick - repeat level came back at 0.58. Hgb 13.3, plt 138. No s/sx of bleeding or infusion issues.   Goal of Therapy:  Heparin level 0.3-0.7 units/ml; Monitor platelets by anticoagulation protocol: Yes   Plan:  Continue heparin to 700 units/hr  Daily heparin level and CBC Will transition to oral anticoagulation when procedures complete  Antonietta Jewel, PharmD, [place] Pharmacist  Phone: [phone] 05/25/2021 7:22 AM  Please check AMION for all [place] phone numbers After 10:00 PM, call [place] [phone]

## 2021-05-26 DIAGNOSIS — I5021 Acute systolic (congestive) heart failure: Secondary | ICD-10-CM

## 2021-05-26 LAB — HEPARIN LEVEL (UNFRACTIONATED): Heparin Unfractionated: 0.41 IU/mL (ref 0.30–0.70)

## 2021-05-26 LAB — CBC
HCT: 38.1 % — ABNORMAL LOW (ref 39.0–52.0)
Hemoglobin: 13 g/dL (ref 13.0–17.0)
MCH: 32.1 pg (ref 26.0–34.0)
MCHC: 34.1 g/dL (ref 30.0–36.0)
MCV: 94.1 fL (ref 80.0–100.0)
Platelets: 126 10*3/uL — ABNORMAL LOW (ref 150–400)
RBC: 4.05 MIL/uL — ABNORMAL LOW (ref 4.22–5.81)
RDW: 13.8 % (ref 11.5–15.5)
WBC: 5.7 10*3/uL (ref 4.0–10.5)
nRBC: 0 % (ref 0.0–0.2)

## 2021-05-26 LAB — RENAL FUNCTION PANEL
Albumin: 3.6 g/dL (ref 3.5–5.0)
Anion gap: 17 — ABNORMAL HIGH (ref 5–15)
BUN: 46 mg/dL — ABNORMAL HIGH (ref 8–23)
CO2: 18 mmol/L — ABNORMAL LOW (ref 22–32)
Calcium: 9.6 mg/dL (ref 8.9–10.3)
Chloride: 96 mmol/L — ABNORMAL LOW (ref 98–111)
Creatinine, Ser: 10.78 mg/dL — ABNORMAL HIGH (ref 0.61–1.24)
GFR, Estimated: 5 mL/min — ABNORMAL LOW (ref >60)
Glucose, Bld: 124 mg/dL — ABNORMAL HIGH (ref 70–99)
Phosphorus: 4.8 mg/dL — ABNORMAL HIGH (ref 2.5–4.6)
Potassium: 4.2 mmol/L (ref 3.5–5.1)
Sodium: 131 mmol/L — ABNORMAL LOW (ref 135–145)

## 2021-05-26 LAB — COOXEMETRY PANEL
Carboxyhemoglobin: 1 % (ref 0.5–1.5)
Methemoglobin: 1 % (ref 0.0–1.5)
O2 Saturation: 61.6 %
Total hemoglobin: 11 g/dL — ABNORMAL LOW (ref 12.0–16.0)

## 2021-05-26 LAB — GLUCOSE, CAPILLARY
Glucose-Capillary: 112 mg/dL — ABNORMAL HIGH (ref 70–99)
Glucose-Capillary: 140 mg/dL — ABNORMAL HIGH (ref 70–99)
Glucose-Capillary: 94 mg/dL (ref 70–99)
Glucose-Capillary: 148 mg/dL — ABNORMAL HIGH (ref 70–99)

## 2021-05-26 MED ORDER — HEPARIN (PORCINE) 25000 UT/250ML-% IV SOLN: 700.0000 [IU]/h | INTRAVENOUS | Status: AC

## 2021-05-26 NOTE — Progress Notes (Signed)
[place] for Eliquis > Heparin Indication: atrial fibrillation  Allergies  Allergen Reactions   Other Other (See Comments)    Pollen - unknown    Patient Measurements: Height: 5\' 11"  (180.3 cm) Weight: 71.5 kg (157 lb 10.1 oz) IBW/kg (Calculated) : 75.3 Heparin Dosing Weight: 78 kg  Vital Signs: Temp: 98 F (36.7 C) (01/22 1117) Temp Source: Oral (01/22 1117) BP: 104/55 (01/22 1117) Pulse Rate: 66 (01/22 1117)  Labs: Recent Labs    05/24/21 0400 05/25/21 0500 05/25/21 0637 05/25/21 0748 05/26/21 0853  HGB 12.7* 13.3  --   --  13.0  HCT 36.8* 38.5*  --   --  38.1*  PLT 144* 138*  --   --  126*  HEPARINUNFRC 0.62  --  >1.10* 0.58 0.41  CREATININE 9.84* 8.61*  --   --  10.78*     Estimated Creatinine Clearance: 6 mL/min (A) (by C-G formula based on SCr of 10.78 mg/dL (H)).   Assessment: 76 yo male on chronic Eliquis for afib, now initiated on CRRT and pharmacy asked to transition Eliquis to IV heparin.     Recent DCCV 1/9 but went back into AF. Of note, last dose of Eliquis given 1/11 at 2200 pm.   Heparin level came back at 0.41, on 700 units/hr (peripheral draw). Hgb 13, plt 126. No s/sx of bleeding or infusion issues.   Goal of Therapy:  Heparin level 0.3-0.7 units/ml; Monitor platelets by anticoagulation protocol: Yes   Plan:  Continue heparin to 700 units/hr  Daily heparin level and CBC Will transition to oral anticoagulation when procedures complete Plan for heparin infusion stop on 1/23@0800  for procedure  Antonietta Jewel, PharmD, [place] Pharmacist  Phone: [phone] 05/26/2021 11:34 AM  Please check AMION for all [place] phone numbers After 10:00 PM, call [place] [phone]

## 2021-05-26 NOTE — Progress Notes (Signed)
[facility] Progress Note    Assessment/ Plan:    AKI on CKD 3b:normally sees Dr Posey Pronto.  On ARB as OP.  He has had both significant hyper- and hypotensive episodes during this hospitalization requiring treatment and a contrast load on 05/09/21.  Renal US without obstruction and R 6 mm renal calculus.  UA on admission bland.AKI component suspected ATN + CIN combination + cardiorenal sydrome - CRRT 05/15/21-05/17/21 now tolerating iHD with midodrine 10 TID support.  Last HD 1/20 1.5L UF.  Next HD Monday . - he's had intermittent UOP but not reliably -Had [facility] placed 1/17.   - At this point still AKI but given anuric status and moderate underlying CKD I'm less hopeful for recovery as he's nearing 3 weeks and is still anuric.   -AV access tomorrow with VVS -HD tomorrow - has MWF set up outpt, can d/c as early as tomorrow   2.  Acute on chronic systolic CHF: EF 87-56%, previously on inotropes, now on midodrine and off  dobutamine.  CHF managing.    3.  CAD - LHC 05/09/21, 3V disease - not thought to be a candidate for surgery per CVTS   4.  Late-presenting MI - trops peaked > 20,000   5.  Afib: - s/p DCCV 05/13/21 - PO amiodarone - Eliquis --> hep gtt for now given access surgery.   6.  MR: thought to be infarct related  7.  Dispo: has MWF outpt HD arranged for [facility] when ready for d/c - will need HD and AVG surgery Monday then should be ready to go.   Subjective:    No new issues.  Quiet weekend.  2 family present bedside.  Plans for AV access in OR tomorrow, consented.  Then HD.      Objective:   BP (!) 116/59 (BP Location: Left Arm)    Pulse 70    Temp 98 F (36.7 C) (Oral)    Resp 18    Ht 5\' 11"  (1.803 m)    Wt 71.5 kg    SpO2 92%    BMI 21.98 kg/m   Intake/Output Summary (Last 24 hours) at 05/26/2021 0912 Last data filed at 05/26/2021 0500 Gross per 24 hour  Intake 615.34 ml  Output --  Net 615.34 ml    Weight change: -0.2 kg  Physical Exam: GEN lying flat  in bed, appears relatively comfortable HEENT no jaundice PULM clear CV RRR ABD soft EXT edema resolved ACCESS: R IJ TDC c/d/i  Imaging: No results found.  Labs: BMET Recent Labs  Lab 05/21/21 0449 05/22/21 0435 05/23/21 0430 05/24/21 0400 05/25/21 0500  NA 131* 132* 137 136 133*  K 4.3 4.2 4.2 4.3 4.2  CL 96* 99 100 98 95*  CO2 24 22 26 25 25   GLUCOSE 93 102* 107* 102* 99  BUN 19 37* 25* 39* 31*  CREATININE 6.31* 8.25* 7.30* 9.84* 8.61*  CALCIUM 9.2 8.8* 9.3 9.7 9.6  PHOS 3.3 4.4 3.7 4.7* 5.1*    CBC Recent Labs  Lab 05/22/21 0434 05/23/21 0430 05/24/21 0400 05/25/21 0500  WBC 7.3 6.6 6.8 6.4  HGB 12.6* 13.1 12.7* 13.3  HCT 36.7* 37.3* 36.8* 38.5*  MCV 93.1 91.2 92.9 93.2  PLT 140* 140* 144* 138*     Medications:     amiodarone  200 mg Oral BID   atorvastatin  80 mg Oral Daily   Chlorhexidine Gluconate Cloth  6 each Topical E3329   folic acid  1 mg  Oral Daily   mouth rinse  15 mL Mouth Rinse BID   midodrine  10 mg Oral TID WC   multivitamin  1 tablet Oral QHS   ondansetron  4 mg Oral TID WC   senna-docusate  1 tablet Oral BID   sodium chloride flush  10-40 mL Intracatheter Q12H   sodium chloride flush  3 mL Intravenous Q12H   thiamine  100 mg Oral Daily    Jannifer Hick MD [facility] Pager [phone]

## 2021-05-26 NOTE — Progress Notes (Signed)
Patient ID: Ruben [name], male   DOB: [birth year], 76 y.o.   MRN: [id number]     Cardiology Rounding Note  PCP-Cardiologist: Sanda Klein, MD   Subjective:   12/26: Admit with late presentation MI. New AF 12/28: CGS initial co-ox 47%. Started NE + mil. 01/03: SR 05/08/20 Back in A fib and milrinone was cut back to 0.125 mcg.  05/09/20 Milrinone increased back to 0.25. Volume overload, elevated filling pressures on RHC with preserved CO on milrinone. Hypertensive. Back in ICU on nitro gtt.  LHC 1/5: occluded RCA (culprit), diffuse disease in LAD and Lcx. Seen by CVTS. Not felt to be a candidate for surgery. 1/9: S/P TEE followed by DC/CV -->SR.  1/11: Afib w/ RVR>>amio gtt started 1/11: Moved to ICU for CVVH  1/13: Stopped CVVH 1/14: iHD 1/15: DBA stopped. Given IV lasix with 10 cc urine output.  1/16: iHD 1/18: iHD  Co-ox remains stable off DBA, 62% today.  Maintaining NSR.  Denies any chest pain or dyspnea   Objective:   Weight Range: 71.5 kg Body mass index is 21.98 kg/m.   Vital Signs:   Temp:  [97.7 F (36.5 C)-98.5 F (36.9 C)] 98 F (36.7 C) (01/22 0727) Pulse Rate:  [60-70] 70 (01/22 0727) Resp:  [12-23] 18 (01/22 0727) BP: (96-116)/(53-60) 116/59 (01/22 0727) SpO2:  [89 %-94 %] 92 % (01/22 0727) Weight:  [71.5 kg] 71.5 kg (01/22 0540) Last BM Date: 05/23/21  Weight change: Filed Weights   05/24/21 1200 05/25/21 0641 05/26/21 0540  Weight: 70.2 kg 70.2 kg 71.5 kg    Intake/Output:   Intake/Output Summary (Last 24 hours) at 05/26/2021 7867 Last data filed at 05/26/2021 0500 Gross per 24 hour  Intake 615.34 ml  Output --  Net 615.34 ml       Physical Exam   General:  Well appearing. No respiratory difficulty HEENT: normal Neck: supple. no JVD.  Cor: Regular rate & rhythm. 2/6 systolic murmur Lungs: clear Abdomen: soft, nontender, nondistended.  Extremities: no edema Neuro: alert & oriented x 3   Telemetry   NSR 50-60s (personally  reviewed)  Labs    CBC Recent Labs    05/24/21 0400 05/25/21 0500  WBC 6.8 6.4  HGB 12.7* 13.3  HCT 36.8* 38.5*  MCV 92.9 93.2  PLT 144* 138*    Basic Metabolic Panel Recent Labs    05/24/21 0400 05/25/21 0500  NA 136 133*  K 4.3 4.2  CL 98 95*  CO2 25 25  GLUCOSE 102* 99  BUN 39* 31*  CREATININE 9.84* 8.61*  CALCIUM 9.7 9.6  PHOS 4.7* 5.1*    Liver Function Tests Recent Labs    05/24/21 0400 05/25/21 0500  ALBUMIN 3.4* 3.7     No results for input(s): LIPASE, AMYLASE in the last 72 hours. Cardiac Enzymes No results for input(s): CKTOTAL, CKMB, CKMBINDEX, TROPONINI in the last 72 hours.  BNP: BNP (last 3 results) Recent Labs    04/29/21 1251  BNP 1,659.6*     ProBNP (last 3 results) No results for input(s): PROBNP in the last 8760 hours.   D-Dimer No results for input(s): DDIMER in the last 72 hours. Hemoglobin A1C No results for input(s): HGBA1C in the last 72 hours. Fasting Lipid Panel No results for input(s): CHOL, HDL, LDLCALC, TRIG, CHOLHDL, LDLDIRECT in the last 72 hours. Thyroid Function Tests No results for input(s): TSH, T4TOTAL, T3FREE, THYROIDAB in the last 72 hours.  Invalid input(s): FREET3  Other results:  Imaging    No results found.   Medications:     Scheduled Medications:  amiodarone  200 mg Oral BID   atorvastatin  80 mg Oral Daily   Chlorhexidine Gluconate Cloth  6 each Topical U8828   folic acid  1 mg Oral Daily   mouth rinse  15 mL Mouth Rinse BID   midodrine  10 mg Oral TID WC   multivitamin  1 tablet Oral QHS   ondansetron  4 mg Oral TID WC   senna-docusate  1 tablet Oral BID   sodium chloride flush  10-40 mL Intracatheter Q12H   sodium chloride flush  3 mL Intravenous Q12H   thiamine  100 mg Oral Daily    Infusions:  sodium chloride     sodium chloride     sodium chloride     heparin      PRN Medications: sodium chloride, sodium chloride, sodium chloride, acetaminophen, ALPRAZolam,  alteplase, benzonatate, heparin, lidocaine (PF), lidocaine-prilocaine, ondansetron (ZOFRAN) IV, pentafluoroprop-tetrafluoroeth, pentafluoroprop-tetrafluoroeth, polyethylene glycol, sodium chloride flush, sodium chloride flush    Assessment/Plan   1. CAD: Suspect delayed presentation LCx (versus RCA) MI.  Echo with EF 35-40%, inferolateral and basal inferior akinesis, moderate-severe eccentric likely infarct-related MR, normal RV.  He was not cathed initially due to AKI and delayed presentation. Eventual LHC with occluded RCA (culprit), diffuse moderate disease in LAD and Lcx. No good PCI targets. Not a candidate for CABG. No chest pain.  - Continue statin - Anticoagulated.  2. Acute systolic CHF/cardiogenic shock: Ischemic cardiomyopathy with infarct-related MR. Echo with EF 35-40%, inferolateral and basal inferior akinesis, moderate-severe eccentric likely infarct-related MR, normal RV.  Echo does not look bad enough to explain low output HF, concerned that MR may be playing a role.  TEE on 01/09 with EF 35-40%, moderate MR (suspect infarct-related), aortic dilatation measuring 44 mm, moderate AI. RHC on 0.25 milrinone showed RA mean 16, PA 66/26 (41), PCWP 27, PA 62%, Fick CO 5.04/ CI 2.56.  He was weaned off pressors but restarted dobutamine with persistently low co-ox.   CVVH stopped on 1/13 but started iHD. At this point, looks like he will need iHD ongoing. Co-ox 66% today off dobutamine.  - Continue midodrine 10 tid.  - HD for volume management.  3. Hyponatremia: Managed via HD.  4. Valvular heart disease:  MR moderate in severity on TEE. Likely infarct related. Moderate AI.  5. Atrial fibrillation: First noted this admission.  S/P TEE followed DC-CV--> NSR.  In and out of AF, currently NSR.  Hopefully will hold NSR better off dobutamine.  - Continue amiodarone 200 mg twice a day.  - Heparin gtt for now given AV fistula placement planned by DVS on Monday.  Can start Eliquis following graft 6.  AKI on CKD 3: Baseline creatinine around 1.4 in 6/22. Peak at 3.09 and trended down, now 2.2 -> 2.6 -> 3.1 > 4.0. > 5.3 > 6.58 >7.26 > CVVH started. 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 Renal US with no hydronephrosis. CVVH started 1/11 and stopped 1/13.  However, minimal UOP and creatinine rose so started iHD on 1/14.  Suspect he will need iHD for now.   Still with minimal UOP.  7. Active smoker: Needs to quit.  8. ETOH abuse: Needs to cut back.  9. Thrombocytopenia: Mild, pre-dates heparin initiation.  10. Hyperglycemia: - Continue SSI  - A1c 5.7   Donato Heinz 05/26/2021 9:38 AM

## 2021-05-26 NOTE — Progress Notes (Addendum)
°  Progress Note    05/26/2021 7:45 AM 13 Days Post-Op  Plan for Left upper extremity AV fistula vs AVG tomorrow 1/23 by Dr. Virl Cagey Consent order placed in chart NPO after midnight   Karoline Caldwell, PA-C [facility] [phone] 05/26/2021 7:45 AM  I have interviewed the patient and examined the patient. I agree with the findings by the PA.  I have stopped the heparin at 8 AM in anticipation of surgery at around 10 AM or 11 AM.  I spoke with the patient and his daughter at the bedside and answered all their questions.  He is agreeable to proceed.  Based on his vein mapping will likely require placement of an AV graft.  [name], MD

## 2021-05-27 ENCOUNTER — Encounter (HOSPITAL_COMMUNITY)
Admission: EM | Disposition: A | Discharge status: Home / Self Care | Payer: Self-pay | Source: Ambulatory Visit | Attending: Cardiovascular Disease

## 2021-05-27 ENCOUNTER — Inpatient Hospital Stay (HOSPITAL_COMMUNITY): Payer: Medicare Other | Admitting: Anesthesiology

## 2021-05-27 ENCOUNTER — Other Ambulatory Visit: Payer: Self-pay

## 2021-05-27 ENCOUNTER — Ambulatory Visit: Payer: Medicare Other | Admitting: Nurse Practitioner

## 2021-05-27 HISTORY — PX: AV FISTULA PLACEMENT: SHX1204

## 2021-05-27 LAB — CBC
HCT: 36.8 % — ABNORMAL LOW (ref 39.0–52.0)
Hemoglobin: 12.9 g/dL — ABNORMAL LOW (ref 13.0–17.0)
MCH: 32.1 pg (ref 26.0–34.0)
MCHC: 35.1 g/dL (ref 30.0–36.0)
MCV: 91.5 fL (ref 80.0–100.0)
Platelets: 138 10*3/uL — ABNORMAL LOW (ref 150–400)
RBC: 4.02 MIL/uL — ABNORMAL LOW (ref 4.22–5.81)
RDW: 13.8 % (ref 11.5–15.5)
WBC: 6.8 10*3/uL (ref 4.0–10.5)
nRBC: 0 % (ref 0.0–0.2)

## 2021-05-27 LAB — BASIC METABOLIC PANEL
Anion gap: 11 (ref 5–15)
BUN: 60 mg/dL — ABNORMAL HIGH (ref 8–23)
CO2: 26 mmol/L (ref 22–32)
Calcium: 9.5 mg/dL (ref 8.9–10.3)
Chloride: 95 mmol/L — ABNORMAL LOW (ref 98–111)
Creatinine, Ser: 12.1 mg/dL — ABNORMAL HIGH (ref 0.61–1.24)
GFR, Estimated: 4 mL/min — ABNORMAL LOW (ref >60)
Glucose, Bld: 111 mg/dL — ABNORMAL HIGH (ref 70–99)
Potassium: 4.8 mmol/L (ref 3.5–5.1)
Sodium: 132 mmol/L — ABNORMAL LOW (ref 135–145)

## 2021-05-27 LAB — RENAL FUNCTION PANEL
Albumin: 3.6 g/dL (ref 3.5–5.0)
Anion gap: 14 (ref 5–15)
BUN: 59 mg/dL — ABNORMAL HIGH (ref 8–23)
CO2: 23 mmol/L (ref 22–32)
Calcium: 9.5 mg/dL (ref 8.9–10.3)
Chloride: 96 mmol/L — ABNORMAL LOW (ref 98–111)
Creatinine, Ser: 11.58 mg/dL — ABNORMAL HIGH (ref 0.61–1.24)
GFR, Estimated: 4 mL/min — ABNORMAL LOW (ref >60)
Glucose, Bld: 103 mg/dL — ABNORMAL HIGH (ref 70–99)
Phosphorus: 5.5 mg/dL — ABNORMAL HIGH (ref 2.5–4.6)
Potassium: 4.5 mmol/L (ref 3.5–5.1)
Sodium: 133 mmol/L — ABNORMAL LOW (ref 135–145)

## 2021-05-27 LAB — GLUCOSE, CAPILLARY
Glucose-Capillary: 106 mg/dL — ABNORMAL HIGH (ref 70–99)
Glucose-Capillary: 125 mg/dL — ABNORMAL HIGH (ref 70–99)

## 2021-05-27 LAB — COOXEMETRY PANEL
Carboxyhemoglobin: 0.7 % (ref 0.5–1.5)
Methemoglobin: 0.9 % (ref 0.0–1.5)
O2 Saturation: 51.8 %
Total hemoglobin: 15.8 g/dL (ref 12.0–16.0)

## 2021-05-27 LAB — HEPARIN LEVEL (UNFRACTIONATED): Heparin Unfractionated: 0.4 IU/mL (ref 0.30–0.70)

## 2021-05-27 SURGERY — ARTERIOVENOUS (AV) FISTULA CREATION
Anesthesia: Monitor Anesthesia Care | Site: Arm Upper | Laterality: Left

## 2021-05-27 MED ORDER — 0.9 % SODIUM CHLORIDE (POUR BTL) OPTIME
TOPICAL | Status: DC | PRN
  Administered 2021-05-27: 1000 mL

## 2021-05-27 MED ORDER — PROPOFOL 500 MG/50ML IV EMUL
INTRAVENOUS | Status: DC | PRN
  Administered 2021-05-27: 20 ug/kg/min via INTRAVENOUS

## 2021-05-27 MED ORDER — CEFAZOLIN SODIUM-DEXTROSE 2-4 GM/100ML-% IV SOLN
INTRAVENOUS | Status: AC
  Filled 2021-05-27: qty 100

## 2021-05-27 MED ORDER — LIDOCAINE HCL (PF) 1 % IJ SOLN
INTRAMUSCULAR | Status: AC
  Filled 2021-05-27: qty 30

## 2021-05-27 MED ORDER — FENTANYL CITRATE (PF) 100 MCG/2ML IJ SOLN
INTRAMUSCULAR | Status: AC
  Administered 2021-05-27: 50 ug
  Filled 2021-05-27: qty 2

## 2021-05-27 MED ORDER — CHLORHEXIDINE GLUCONATE 0.12 % MT SOLN
OROMUCOSAL | Status: AC
  Filled 2021-05-27: qty 15

## 2021-05-27 MED ORDER — ACETAMINOPHEN 500 MG PO TABS
1000.0000 mg | ORAL_TABLET | Freq: Once | ORAL | Status: AC
  Administered 2021-05-27: 1000 mg via ORAL

## 2021-05-27 MED ORDER — HEPARIN SODIUM (PORCINE) 1000 UNIT/ML IJ SOLN
INTRAMUSCULAR | Status: DC | PRN
Start: 2021-05-27 — End: 2021-05-27
  Administered 2021-05-27: 2000 [IU] via INTRAVENOUS

## 2021-05-27 MED ORDER — ACETAMINOPHEN 500 MG PO TABS
ORAL_TABLET | ORAL | Status: AC
  Filled 2021-05-27: qty 2

## 2021-05-27 MED ORDER — OXYCODONE HCL 5 MG/5ML PO SOLN: 5.0000 mg | Freq: Once | ORAL | Status: DC | PRN

## 2021-05-27 MED ORDER — HEPARIN SODIUM (PORCINE) 1000 UNIT/ML IJ SOLN
INTRAMUSCULAR | Status: AC
  Filled 2021-05-27: qty 4

## 2021-05-27 MED ORDER — HEMOSTATIC AGENTS (NO CHARGE) OPTIME
TOPICAL | Status: DC | PRN
Start: 2021-05-27 — End: 2021-05-27
  Administered 2021-05-27: 1 via TOPICAL

## 2021-05-27 MED ORDER — MIDAZOLAM HCL 2 MG/2ML IJ SOLN: 1.0000 mg | Freq: Once | INTRAMUSCULAR | Status: AC

## 2021-05-27 MED ORDER — CHLORHEXIDINE GLUCONATE 0.12 % MT SOLN: 15.0000 mL | Freq: Once | OROMUCOSAL | Status: AC

## 2021-05-27 MED ORDER — FENTANYL CITRATE PF 50 MCG/ML IJ SOSY
50.0000 ug | PREFILLED_SYRINGE | Freq: Once | INTRAMUSCULAR | Status: AC
  Administered 2021-05-27: 50 ug via INTRAVENOUS

## 2021-05-27 MED ORDER — CHLORHEXIDINE GLUCONATE 0.12 % MT SOLN
OROMUCOSAL | Status: AC
  Administered 2021-05-27: 15 mL via OROMUCOSAL
  Filled 2021-05-27: qty 15

## 2021-05-27 MED ORDER — ORAL CARE MOUTH RINSE: 15.0000 mL | Freq: Once | OROMUCOSAL | Status: AC

## 2021-05-27 MED ORDER — OXYCODONE-ACETAMINOPHEN 5-325 MG PO TABS: 1.0000 | ORAL_TABLET | Freq: Four times a day (QID) | ORAL | Status: DC | PRN

## 2021-05-27 MED ORDER — HEPARIN 6000 UNIT IRRIGATION SOLUTION
Status: DC | PRN
  Administered 2021-05-27: 1

## 2021-05-27 MED ORDER — MIDAZOLAM HCL 2 MG/2ML IJ SOLN: 0.5000 mg | Freq: Once | INTRAMUSCULAR | Status: DC | PRN

## 2021-05-27 MED ORDER — LIDOCAINE HCL 1 % IJ SOLN
INTRAMUSCULAR | Status: DC | PRN
  Administered 2021-05-27: 3 mL

## 2021-05-27 MED ORDER — MEPIVACAINE HCL (PF) 2 % IJ SOLN
INTRAMUSCULAR | Status: DC | PRN
Start: 2021-05-27 — End: 2021-05-27
  Administered 2021-05-27: 20 mL

## 2021-05-27 MED ORDER — PHENYLEPHRINE HCL-NACL 20-0.9 MG/250ML-% IV SOLN
INTRAVENOUS | Status: DC | PRN
  Administered 2021-05-27: 50 ug/min via INTRAVENOUS

## 2021-05-27 MED ORDER — FENTANYL CITRATE (PF) 100 MCG/2ML IJ SOLN: 25.0000 ug | INTRAMUSCULAR | Status: DC | PRN

## 2021-05-27 MED ORDER — OXYCODONE HCL 5 MG PO TABS: 5.0000 mg | ORAL_TABLET | Freq: Once | ORAL | Status: DC | PRN

## 2021-05-27 MED ORDER — PROMETHAZINE HCL 25 MG/ML IJ SOLN: 6.2500 mg | INTRAMUSCULAR | Status: DC | PRN

## 2021-05-27 MED ORDER — CEFAZOLIN SODIUM-DEXTROSE 2-3 GM-%(50ML) IV SOLR
INTRAVENOUS | Status: DC | PRN
  Administered 2021-05-27: 2 g via INTRAVENOUS

## 2021-05-27 MED ORDER — HEPARIN 6000 UNIT IRRIGATION SOLUTION
Status: AC
  Filled 2021-05-27: qty 500

## 2021-05-27 MED ORDER — MIDAZOLAM HCL 2 MG/2ML IJ SOLN
INTRAMUSCULAR | Status: AC
  Administered 2021-05-27: 1 mg via INTRAVENOUS
  Filled 2021-05-27: qty 2

## 2021-05-27 MED ORDER — SODIUM CHLORIDE 0.9 % IV SOLN: INTRAVENOUS | Status: DC

## 2021-05-27 SURGICAL SUPPLY — 41 items
ARMBAND PINK RESTRICT EXTREMIT (MISCELLANEOUS) ×3 IMPLANT
BAG COUNTER SPONGE SURGICOUNT (BAG) ×1 IMPLANT
BAG SURGICOUNT SPONGE COUNTING (BAG)
BLADE CLIPPER SURG (BLADE) ×3 IMPLANT
BNDG ELASTIC 6X5.8 VLCR STR LF (GAUZE/BANDAGES/DRESSINGS) ×2 IMPLANT
CANISTER SUCT 3000ML PPV (MISCELLANEOUS) ×3 IMPLANT
CLIP LIGATING EXTRA MED SLVR (CLIP) ×3 IMPLANT
CLIP LIGATING EXTRA SM BLUE (MISCELLANEOUS) ×3 IMPLANT
CLIP VESOCCLUDE MED 6/CT (CLIP) ×1 IMPLANT
COVER PROBE W GEL 5X96 (DRAPES) ×3 IMPLANT
DECANTER SPIKE VIAL GLASS SM (MISCELLANEOUS) ×3 IMPLANT
DERMABOND ADVANCED (GAUZE/BANDAGES/DRESSINGS) ×2
DERMABOND ADVANCED .7 DNX12 (GAUZE/BANDAGES/DRESSINGS) ×1 IMPLANT
ELECT REM PT RETURN 9FT ADLT (ELECTROSURGICAL) ×3
ELECTRODE REM PT RTRN 9FT ADLT (ELECTROSURGICAL) ×1 IMPLANT
GAUZE 4X4 16PLY ~~LOC~~+RFID DBL (SPONGE) ×2 IMPLANT
GLOVE SRG 8 PF TXTR STRL LF DI (GLOVE) ×2 IMPLANT
GLOVE SURG POLYISO LF SZ8 (GLOVE) IMPLANT
GLOVE SURG UNDER POLY LF SZ8 (GLOVE) ×4
GOWN STRL REUS W/ TWL LRG LVL3 (GOWN DISPOSABLE) ×2 IMPLANT
GOWN STRL REUS W/TWL 2XL LVL3 (GOWN DISPOSABLE) ×3 IMPLANT
GOWN STRL REUS W/TWL LRG LVL3 (GOWN DISPOSABLE) ×6
GRAFT GORETEX STRT 4-7X45 (Vascular Products) ×2 IMPLANT
HEMOSTAT SNOW SURGICEL 2X4 (HEMOSTASIS) ×2 IMPLANT
KIT BASIN OR (CUSTOM PROCEDURE TRAY) ×3 IMPLANT
KIT TURNOVER KIT B (KITS) ×3 IMPLANT
NEEDLE 22X1 1/2 (OR ONLY) (NEEDLE) ×2 IMPLANT
NS IRRIG 1000ML POUR BTL (IV SOLUTION) ×3 IMPLANT
PACK CV ACCESS (CUSTOM PROCEDURE TRAY) ×3 IMPLANT
PAD ARMBOARD 7.5X6 YLW CONV (MISCELLANEOUS) ×6 IMPLANT
SPONGE T-LAP 18X18 ~~LOC~~+RFID (SPONGE) ×2 IMPLANT
SUT MNCRL AB 4-0 PS2 18 (SUTURE) ×3 IMPLANT
SUT PROLENE 6 0 BV (SUTURE) ×5 IMPLANT
SUT PROLENE 7 0 BV 1 (SUTURE) IMPLANT
SUT SILK 2 0 SH (SUTURE) ×1 IMPLANT
SUT SILK 3 0 SH CR/8 (SUTURE) ×3 IMPLANT
SUT VIC AB 3-0 SH 27 (SUTURE) ×4
SUT VIC AB 3-0 SH 27X BRD (SUTURE) ×1 IMPLANT
TOWEL GREEN STERILE (TOWEL DISPOSABLE) ×3 IMPLANT
UNDERPAD 30X36 HEAVY ABSORB (UNDERPADS AND DIAPERS) ×3 IMPLANT
WATER STERILE IRR 1000ML POUR (IV SOLUTION) ×3 IMPLANT

## 2021-05-27 NOTE — Anesthesia Procedure Notes (Signed)
Anesthesia Regional Block: Supraclavicular block   Pre-Anesthetic Checklist: , timeout performed,  Correct Patient, Correct Site, Correct Laterality,  Correct Procedure, Correct Position, site marked,  Risks and benefits discussed,  Surgical consent,  Pre-op evaluation,  At surgeon's request and post-op pain management  Laterality: Left and Upper  Prep: chloraprep       Needles:  Injection technique: Single-shot  Needle Type: Echogenic Needle     Needle Length: 9cm  Needle Gauge: 21     Additional Needles:   Procedures:,,,, ultrasound used (permanent image in chart),,    Narrative:  Start time: 05/27/2021 12:16 PM End time: 05/27/2021 12:22 PM Injection made incrementally with aspirations every 5 mL.  Performed by: Personally  Anesthesiologist: Annye Asa, MD  Additional Notes: Pt identified in Holding room.  Monitors applied. Working IV access confirmed. Sterile prep L clavicle and neck.  #21ga ECHOgenic Arrow block needle to supraclav brachial plexus with US guidance.  20cc 0.2% Mepivacaine injected incrementally after negative test dose.  Patient asymptomatic, VSS, no heme aspirated, tolerated well.   Jenita Seashore, MD

## 2021-05-27 NOTE — Progress Notes (Signed)
[place] for Eliquis > Heparin Indication: atrial fibrillation  Allergies  Allergen Reactions   Other Other (See Comments)    Pollen - unknown    Patient Measurements: Height: 5\' 11"  (180.3 cm) Weight: 71 kg (156 lb 8.4 oz) IBW/kg (Calculated) : 75.3 Heparin Dosing Weight: 78 kg  Vital Signs: Temp: 97.9 F (36.6 C) (01/23 1046) Temp Source: Oral (01/23 1046) BP: 105/64 (01/23 1046) Pulse Rate: 62 (01/23 1046)  Labs: Recent Labs    05/25/21 0500 05/25/21 3734 05/25/21 0748 05/26/21 0853 05/27/21 0638 05/27/21 0748  HGB 13.3  --   --  13.0  --  12.9*  HCT 38.5*  --   --  38.1*  --  36.8*  PLT 138*  --   --  126*  --  138*  HEPARINUNFRC  --    < > 0.58 0.41  --  0.40  CREATININE 8.61*  --   --  10.78* 11.58* 12.10*   < > = values in this interval not displayed.     Estimated Creatinine Clearance: 5.3 mL/min (A) (by C-G formula based on SCr of 12.1 mg/dL (H)).   Assessment: 76 yo male on chronic Eliquis for afib, now initiated on CRRT and pharmacy asked to transition Eliquis to IV heparin until all procedures complete. Recent DCCV 1/9 but went back into AF. Of note, last dose of Eliquis given 1/11 at 2200 pm.   Heparin level therapeutic this am, CBC stable, now off for HD access placement with VVS.  Goal of Therapy:  Heparin level 0.3-0.7 units/ml; Monitor platelets by anticoagulation protocol: Yes   Plan:  F/U post HD access  Arrie Senate, PharmD, BCPS, [facility] Clinical Pharmacist [phone] Please check AMION for all [place] numbers 05/27/2021

## 2021-05-27 NOTE — Progress Notes (Signed)
°  Progress Note    05/27/2021 12:45 PM Day of Surgery  Plan for Left upper extremity AV fistula vs AVG today Consent order placed in chart  Doing well with no complaints, no questions regarding surgery Artery palpable radial pulse bilaterally Tape from previous venous access in the left antecubital fossa Nonlabored breathing Regular rate  Patient is a 76 year old male with end-stage renal disease and need of long-term HD access.  After discussing the risks and benefits of left-sided AV fistula versus AV graft, Ruben [name] elected to proceed   Ruben [name],  [facility] [phone] 05/27/2021 12:45 PM

## 2021-05-27 NOTE — Discharge Instructions (Addendum)
[facility]  Discharge Instructions  AV Fistula or Graft Surgery for Dialysis Access  Please refer to the following instructions for your post-procedure care. Your surgeon or physician assistant will discuss any changes with you.  Activity  You may drive the day following your surgery, if you are comfortable and no longer taking prescription pain medication. Resume full activity as the soreness in your incision resolves.  Bathing/Showering  You may shower after you go home. Keep your incision dry for 48 hours. Do not soak in a bathtub, hot tub, or swim until the incision heals completely. You may not shower if you have a hemodialysis catheter.  Incision Care  Clean your incision with mild soap and water after 48 hours. Pat the area dry with a clean towel. You do not need a bandage unless otherwise instructed. Do not apply any ointments or creams to your incision. You may have skin glue on your incision. Do not peel it off. It will come off on its own in about one week. Your arm may swell a bit after surgery. To reduce swelling use pillows to elevate your arm so it is above your heart. Your doctor will tell you if you need to lightly wrap your arm with an ACE bandage.  Diet  Resume your normal diet. There are not special food restrictions following this procedure. In order to heal from your surgery, it is CRITICAL to get adequate nutrition. Your body requires vitamins, minerals, and protein. Vegetables are the best source of vitamins and minerals. Vegetables also provide the perfect balance of protein. Processed food has little nutritional value, so try to avoid this.  Medications  Resume taking all of your medications. If your incision is causing pain, you may take over-the counter pain relievers such as acetaminophen (Tylenol). If you were prescribed a stronger pain medication, please be aware these medications can cause nausea and constipation. Prevent  nausea by taking the medication with a snack or meal. Avoid constipation by drinking plenty of fluids and eating foods with high amount of fiber, such as fruits, vegetables, and grains.  Do not take Tylenol if you are taking prescription pain medications.  Follow up Your surgeon may want to see you in the office following your access surgery. If so, this will be arranged at the time of your surgery.  Please call us immediately for any of the following conditions:  Increased pain, redness, drainage (pus) from your incision site Fever of 101 degrees or higher Severe or worsening pain at your incision site Hand pain or numbness.  Reduce your risk of vascular disease:  Stop smoking. If you would like help, call [facility] at 1-800-QUIT-NOW 3408445471) or [employer] at [place] your cholesterol Maintain a desired weight Control your diabetes Keep your blood pressure down  Dialysis  It will take several weeks to several months for your new dialysis access to be ready for use. Your surgeon will determine when it is okay to use it. Your nephrologist will continue to direct your dialysis. You can continue to use your Permcath until your new access is ready for use.   05/27/2021 Ruben [name] Ruben [name] [id number] [birth year]  Surgeon(s): Broadus John, MD  Procedure(s): LEFT ARTERIOVENOUS (AV) GRAFT CREATION   May stick graft immediately   May stick graft on designated area only:   X Do not stick Left AV Graft for 4 weeks    If you have any questions, please call the office at  [phone].  Information on my medicine - ELIQUIS (apixaban)  This medication education was reviewed with me or my healthcare representative as part of my discharge preparation.  The pharmacist that spoke with me during my hospital stay was:  Einar Grad, [facility]  Why was Eliquis prescribed for you? Eliquis was prescribed for you to reduce the risk of a blood clot forming that can  cause a stroke if you have a medical condition called atrial fibrillation (a type of irregular heartbeat).  What do You need to know about Eliquis ? Take your Eliquis TWICE DAILY - one tablet in the morning and one tablet in the evening with or without food. If you have difficulty swallowing the tablet whole please discuss with your pharmacist how to take the medication safely.  Take Eliquis exactly as prescribed by your doctor and DO NOT stop taking Eliquis without talking to the doctor who prescribed the medication.  Stopping may increase your risk of developing a stroke.  Refill your prescription before you run out.  After discharge, you should have regular check-up appointments with your healthcare provider that is prescribing your Eliquis.  In the future your dose may need to be changed if your kidney function or weight changes by a significant amount or as you get older.  What do you do if you miss a dose? If you miss a dose, take it as soon as you remember on the same day and resume taking twice daily.  Do not take more than one dose of ELIQUIS at the same time to make up a missed dose.  Important Safety Information A possible side effect of Eliquis is bleeding. You should call your healthcare provider right away if you experience any of the following: Bleeding from an injury or your nose that does not stop. Unusual colored urine (red or dark brown) or unusual colored stools (red or black). Unusual bruising for unknown reasons. A serious fall or if you hit your head (even if there is no bleeding).  Some medicines may interact with Eliquis and might increase your risk of bleeding or clotting while on Eliquis. To help avoid this, consult your healthcare provider or pharmacist prior to using any new prescription or non-prescription medications, including herbals, vitamins, non-steroidal anti-inflammatory drugs (NSAIDs) and supplements.  This website has more information on Eliquis  (apixaban): http://www.eliquis.com/eliquis/home

## 2021-05-27 NOTE — Plan of Care (Signed)
°  Problem: Education: Goal: Knowledge of General Education information will improve Description: Including pain rating scale, medication(s)/side effects and non-pharmacologic comfort measures Outcome: Progressing   Problem: Health Behavior/Discharge Planning: Goal: Ability to manage health-related needs will improve Outcome: Progressing   Problem: Clinical Measurements: Goal: Ability to maintain clinical measurements within normal limits will improve Outcome: Progressing Goal: Will remain free from infection Outcome: Progressing Goal: Diagnostic test results will improve Outcome: Progressing Goal: Cardiovascular complication will be avoided Outcome: Progressing   Problem: Activity: Goal: Risk for activity intolerance will decrease Outcome: Progressing   Problem: Nutrition: Goal: Adequate nutrition will be maintained Outcome: Progressing   Problem: Coping: Goal: Level of anxiety will decrease Outcome: Progressing   Problem: Elimination: Goal: Will not experience complications related to bowel motility Outcome: Progressing Goal: Will not experience complications related to urinary retention Outcome: Progressing   Problem: Pain Managment: Goal: General experience of comfort will improve Outcome: Progressing   Problem: Safety: Goal: Ability to remain free from injury will improve Outcome: Progressing   Problem: Skin Integrity: Goal: Risk for impaired skin integrity will decrease Outcome: Progressing   Problem: Education: Goal: Ability to demonstrate management of disease process will improve Outcome: Progressing Goal: Ability to verbalize understanding of medication therapies will improve Outcome: Progressing Goal: Individualized Educational Video(s) Outcome: Progressing   Problem: Activity: Goal: Capacity to carry out activities will improve Outcome: Progressing   Problem: Cardiac: Goal: Ability to achieve and maintain adequate cardiopulmonary perfusion will  improve Outcome: Progressing

## 2021-05-27 NOTE — Progress Notes (Signed)
CARDIAC REHAB PHASE I   PRE:  Rate/Rhythm: 63 SR    BP: sitting 101/55    SaO2: 95 RA  MODE:  Ambulation: 740 ft   POST:  Rate/Rhythm: 80 SR    BP: sitting 126/59     SaO2: 95 RA  Pt tolerated very well, quick pace, no c/o, no assist needed. VSS.   Discussed with pt, daughter, wife MI, daily wts, exercise, smoking cessation, and CRPII.  Voiced understanding. He is motivated to quit smoking and receptive of resources. Will refer to [facility] CRPII in hopes that he can tolerated HD and CRPII. Will defer diet to RD at HD center. Gave pt and family education videos to view (renal, cardiac). [place], ACSM 05/27/2021 9:03 AM

## 2021-05-27 NOTE — Progress Notes (Addendum)
Patient ID: Ruben [name], male   DOB: [birth year], 76 y.o.   MRN: [id number]     Advanced Heart Failure Rounding Note  PCP-Cardiologist: Sanda Klein, MD   Subjective:   12/26: Admit with late presentation MI. New AF 12/28: CGS initial co-ox 47%. Started NE + mil. 01/03: SR 05/08/20 Back in A fib and milrinone was cut back to 0.125 mcg.  05/09/20 Milrinone increased back to 0.25. Volume overload, elevated filling pressures on RHC with preserved CO on milrinone. Hypertensive. Back in ICU on nitro gtt.  LHC 1/5: occluded RCA (culprit), diffuse disease in LAD and Lcx. Seen by CVTS. Not felt to be a candidate for surgery. 1/9: S/P TEE followed by DC/CV -->SR.  1/11: Afib w/ RVR>>amio gtt started 1/11: Moved to ICU for CVVH  1/13: Stopped CVVH 1/14: iHD 1/15: DBA stopped. Given IV lasix with 10 cc urine output.  1/16: iHD 1/18: iHD 1/20: iHD  Going for AVF vs AVG today followed by HD.   Co-ox 52% this am off DBA. CVP 6-7.  Renal panel pending.  Continues with minimal UOP. Weight stable.   SBP upper 90s-100s  Able to eat a little more, but still reporting decreased appetite   Objective:   Weight Range: 71 kg Body mass index is 21.83 kg/m.   Vital Signs:   Temp:  [97.9 F (36.6 C)-98.2 F (36.8 C)] 98.2 F (36.8 C) (01/23 0031) Pulse Rate:  [60-70] 64 (01/23 0400) Resp:  [18-22] 20 (01/23 0031) BP: (97-116)/(55-68) 97/60 (01/23 0400) SpO2:  [91 %-95 %] 92 % (01/23 0400) Weight:  [71 kg] 71 kg (01/23 0500) Last BM Date: 05/26/21  Weight change: Filed Weights   05/25/21 0641 05/26/21 0540 05/27/21 0500  Weight: 70.2 kg 71.5 kg 71 kg    Intake/Output:   Intake/Output Summary (Last 24 hours) at 05/27/2021 0703 Last data filed at 05/27/2021 0600 Gross per 24 hour  Intake 250 ml  Output 140 ml  Net 110 ml      Physical Exam   CVP 6-7  General:  Thin, elderly male HEENT: normal Neck: supple. no JVD. R IJ tunneled HD catheter. Carotids 2+ bilat; no  bruits. No lymphadenopathy or thryomegaly appreciated. Cor: PMI nondisplaced. Regular rate & rhythm. No rubs, gallops, 2/6 HSM Lungs: clear Abdomen: soft, nontender, nondistended. No hepatosplenomegaly.  Extremities: no cyanosis, clubbing, rash, edema Neuro: alert & orientedx3, cranial nerves grossly intact. moves all 4 extremities w/o difficulty. Affect pleasant    Telemetry   NSR 60s (personally reviewed)  Labs    CBC Recent Labs    05/25/21 0500 05/26/21 0853  WBC 6.4 5.7  HGB 13.3 13.0  HCT 38.5* 38.1*  MCV 93.2 94.1  PLT 138* 268*   Basic Metabolic Panel Recent Labs    05/25/21 0500 05/26/21 0853  NA 133* 131*  K 4.2 4.2  CL 95* 96*  CO2 25 18*  GLUCOSE 99 124*  BUN 31* 46*  CREATININE 8.61* 10.78*  CALCIUM 9.6 9.6  PHOS 5.1* 4.8*   Liver Function Tests Recent Labs    05/25/21 0500 05/26/21 0853  ALBUMIN 3.7 3.6    No results for input(s): LIPASE, AMYLASE in the last 72 hours. Cardiac Enzymes No results for input(s): CKTOTAL, CKMB, CKMBINDEX, TROPONINI in the last 72 hours.  BNP: BNP (last 3 results) Recent Labs    04/29/21 1251  BNP 1,659.6*    ProBNP (last 3 results) No results for input(s): PROBNP in the last 8760 hours.   D-Dimer  No results for input(s): DDIMER in the last 72 hours. Hemoglobin A1C No results for input(s): HGBA1C in the last 72 hours. Fasting Lipid Panel No results for input(s): CHOL, HDL, LDLCALC, TRIG, CHOLHDL, LDLDIRECT in the last 72 hours. Thyroid Function Tests No results for input(s): TSH, T4TOTAL, T3FREE, THYROIDAB in the last 72 hours.  Invalid input(s): FREET3  Other results:   Imaging    No results found.   Medications:     Scheduled Medications:  amiodarone  200 mg Oral BID   atorvastatin  80 mg Oral Daily   Chlorhexidine Gluconate Cloth  6 each Topical R4431   folic acid  1 mg Oral Daily   mouth rinse  15 mL Mouth Rinse BID   midodrine  10 mg Oral TID WC   multivitamin  1 tablet Oral  QHS   ondansetron  4 mg Oral TID WC   senna-docusate  1 tablet Oral BID   sodium chloride flush  10-40 mL Intracatheter Q12H   sodium chloride flush  3 mL Intravenous Q12H   thiamine  100 mg Oral Daily    Infusions:  sodium chloride     sodium chloride     sodium chloride     heparin      PRN Medications: sodium chloride, sodium chloride, sodium chloride, acetaminophen, ALPRAZolam, alteplase, benzonatate, heparin, lidocaine (PF), lidocaine-prilocaine, ondansetron (ZOFRAN) IV, pentafluoroprop-tetrafluoroeth, pentafluoroprop-tetrafluoroeth, polyethylene glycol, sodium chloride flush, sodium chloride flush    Assessment/Plan   1. CAD: Suspect delayed presentation LCx (versus RCA) MI.  Echo with EF 35-40%, inferolateral and basal inferior akinesis, moderate-severe eccentric likely infarct-related MR, normal RV.  He was not cathed initially due to AKI and delayed presentation. Eventual LHC with occluded RCA (culprit), diffuse moderate disease in LAD and Lcx. No good PCI targets. Not a candidate for CABG. No chest pain.  - Continue statin - Anticoagulated.  2. Acute systolic CHF/cardiogenic shock: Ischemic cardiomyopathy with infarct-related MR. Echo with EF 35-40%, inferolateral and basal inferior akinesis, moderate-severe eccentric likely infarct-related MR, normal RV.  Echo does not look bad enough to explain low output HF, concerned that MR may be playing a role.  TEE on 01/09 with EF 35-40%, moderate MR (suspect infarct-related), aortic dilatation measuring 44 mm, moderate AI. RHC on 0.25 milrinone showed RA mean 16, PA 66/26 (41), PCWP 27, PA 62%, Fick CO 5.04/ CI 2.56.  He was weaned off pressors but restarted dobutamine with persistently low co-ox.   CVVH stopped on 1/13 but started iHD. At this point, looks like he will need iHD ongoing. Co-ox 52% today off dobutamine.  - Continue midodrine 10 tid.  - HD for volume management.  3. Hyponatremia: Managed via HD.  4. Valvular heart  disease:  MR moderate in severity on TEE. Likely infarct related. Moderate AI.  5. Atrial fibrillation: First noted this admission.  S/P TEE followed DC-CV--> NSR.   - Initially in and out of AF, now holding NSR. - Continue amiodarone 200 mg twice a day.  - Heparin gtt until access finalized then Eliquis.  6. AKI on CKD 3: Baseline creatinine around 1.4 in 6/22. Peak at 3.09 and trended down, now 2.2 -> 2.6 -> 3.1 > 4.0. > 5.3 > 6.58 >7.26 > CVVH started. 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 Renal US with no hydronephrosis. CVVH started 1/11 and stopped 1/13.  However, minimal UOP and creatinine rose so started iHD on 1/14.  Suspect he will need iHD for now.  Still with minimal UOP.  - Going for  AVF vs AVG with VVS - HD today - has M, W, F set up as outpatient 7. Active smoker: Needs to quit.  8. ETOH abuse: Needs to cut back.  9. Thrombocytopenia: Mild, pre-dates heparin initiation.  10. Hyperglycemia: - Continue SSI  - A1c 5.7  Continue to mobilize.   Disposition: Could potentially go home later this afternoon or tomorrow. His daughter is available to take him home tomorrow.  FINCH, LINDSAY N, PA-C  05/27/2021 7:03 AM  Patient seen with PA, agree with the above note.   Patient got AV fistula today. Now getting HD. He is stable from a cardiac standpoint.    General: NAD Neck: No JVD, no thyromegaly or thyroid nodule.  Lungs: Clear to auscultation bilaterally with normal respiratory effort. CV: Nondisplaced PMI.  Heart regular S1/S2, no S3/S4, 2/6 HSM apex.  No peripheral edema.   Abdomen: Soft, nontender, no hepatosplenomegaly, no distention.  Skin: Intact without lesions or rashes.  Neurologic: Alert and oriented x 3.  Psych: Normal affect. Extremities: No clubbing or cyanosis.  HEENT: Normal.   After all procedures completed (hopefully after today), he can stop IV heparin and start Eliquis 5 mg bid.  He will need to decrease amiodarone to 200 mg daily at discharge.    Loralie Champagne 05/27/2021

## 2021-05-27 NOTE — Progress Notes (Signed)
[facility] Progress Note    Assessment/ Plan:    AKI on CKD 3b:normally sees Dr Posey Pronto.  On ARB as OP.  He has had both significant hyper- and hypotensive episodes during this hospitalization requiring treatment and a contrast load on 05/09/21.  Renal US without obstruction and R 6 mm renal calculus.  UA on admission bland.AKI component suspected ATN + CIN combination + cardiorenal sydrome - CRRT 05/15/21-05/17/21 now tolerating iHD with midodrine 10 TID support.  Last HD 1/20 1.5L UF.  Next HD today. - he's had intermittent UOP but not reliably -Had [facility] placed 1/17.   - At this point still AKI but given anuric status and moderate underlying CKD I'm less hopeful for recovery as he's nearing 3 weeks and is still anuric.   -AV access today with VVS -HD today- has MWF set up outpt at [facility], can d/c as early as tonight -  family says tomorrow    2.  Acute on chronic systolic CHF: EF 29-51%, previously on inotropes, now on midodrine and off  dobutamine.  CHF managing.    3.  CAD - LHC 05/09/21, 3V disease - not thought to be a candidate for surgery per CVTS   4.  Late-presenting MI - trops peaked > 20,000   5.  Afib: - s/p DCCV 05/13/21 - PO amiodarone - Eliquis --> hep gtt for now given access surgery.   6.  MR: thought to be infarct related  7.  Dispo: has MWF outpt HD arranged for [facility] when ready for d/c - will need HD and AVG surgery Monday then should be ready to go-  family says Tuesday.   8. Anemia-  not an issue   9. Bones-  phos has not been major issue-  no binder-  no recent pth   Subjective:    No new issues.  Quiet weekend.  2 family present bedside.  Plans for AV access in OR today, consented.  Then HD.      Objective:   BP (!) 101/55 (BP Location: Left Arm)    Pulse 63    Temp 98.1 F (36.7 C) (Oral)    Resp 15    Ht 5\' 11"  (1.803 m)    Wt 71 kg    SpO2 92%    BMI 21.83 kg/m   Intake/Output Summary (Last 24 hours) at 05/27/2021 0945 Last data filed at  05/27/2021 0600 Gross per 24 hour  Intake 250 ml  Output 140 ml  Net 110 ml   Weight change: -0.5 kg  Physical Exam: GEN lying flat in bed, appears relatively comfortable HEENT no jaundice PULM clear CV RRR ABD soft EXT edema resolved ACCESS: R IJ TDC c/d/i  Imaging: No results found.  Labs: BMET Recent Labs  Lab 05/21/21 0449 05/22/21 0435 05/23/21 0430 05/24/21 0400 05/25/21 0500 05/26/21 0853 05/27/21 0638 05/27/21 0748  NA 131* 132* 137 136 133* 131* 133* 132*  K 4.3 4.2 4.2 4.3 4.2 4.2 4.5 4.8  CL 96* 99 100 98 95* 96* 96* 95*  CO2 24 22 26 25 25  18* 23 26  GLUCOSE 93 102* 107* 102* 99 124* 103* 111*  BUN 19 37* 25* 39* 31* 46* 59* 60*  CREATININE 6.31* 8.25* 7.30* 9.84* 8.61* 10.78* 11.58* 12.10*  CALCIUM 9.2 8.8* 9.3 9.7 9.6 9.6 9.5 9.5  PHOS 3.3 4.4 3.7 4.7* 5.1* 4.8* 5.5*  --    CBC Recent Labs  Lab 05/24/21 0400 05/25/21 0500 05/26/21 0853 05/27/21 8841  WBC 6.8 6.4 5.7 6.8  HGB 12.7* 13.3 13.0 12.9*  HCT 36.8* 38.5* 38.1* 36.8*  MCV 92.9 93.2 94.1 91.5  PLT 144* 138* 126* 138*    Medications:     amiodarone  200 mg Oral BID   atorvastatin  80 mg Oral Daily   Chlorhexidine Gluconate Cloth  6 each Topical [id number]   folic acid  1 mg Oral Daily   mouth rinse  15 mL Mouth Rinse BID   midodrine  10 mg Oral TID WC   multivitamin  1 tablet Oral QHS   ondansetron  4 mg Oral TID WC   senna-docusate  1 tablet Oral BID   sodium chloride flush  10-40 mL Intracatheter Q12H   sodium chloride flush  3 mL Intravenous Q12H   thiamine  100 mg Oral Daily    Anushri Casalino [facility] Pager [phone]

## 2021-05-27 NOTE — Anesthesia Preprocedure Evaluation (Addendum)
Anesthesia Evaluation  Patient identified by MRN, date of birth, ID band Patient awake    Reviewed: Allergy & Precautions, NPO status , Patient's Chart, lab work & pertinent test results  History of Anesthesia Complications Negative for: history of anesthetic complications  Airway Mallampati: I  TM Distance: >3 FB Neck ROM: Full    Dental  (+) Edentulous Upper, Missing, Loose, Dental Advisory Given, Poor Dentition   Pulmonary COPD,  COPD inhaler, Current Smoker and Patient abstained from smoking.,    breath sounds clear to auscultation       Cardiovascular hypertension, Pt. on medications (-) angina+ CAD and + Past MI   Rhythm:Regular Rate:Normal  05/2021 cath:  1. Elevated left and right heart filling pressures, preserved cardiac output on milrinone 0.25.  2. Occluded RCA, this is cause of his out of hospital MI prior to admission.  Collaterals from the left system.  3. Diffuse moderate disease throughout the LAD and LCx.  Suspect the totality of disease would lead to ischemia in both territories.  No good PCI targets given diffuse disease.   05/13/2021 ECHO: EF 35-40%, mod decreased LVF, inf, infero-lat akinesis, normal RVF, mod AI, mod MR     Neuro/Psych negative neurological ROS     GI/Hepatic negative GI ROS, (+)     substance abuse  alcohol use,   Endo/Other  negative endocrine ROS  Renal/GU ESRF and DialysisRenal disease (K+ 4.8)     Musculoskeletal   Abdominal   Peds  Hematology negative hematology ROS (+)   Anesthesia Other Findings   Reproductive/Obstetrics                            Anesthesia Physical Anesthesia Plan  ASA: 3  Anesthesia Plan: Regional   Post-op Pain Management: Tylenol PO (pre-op)   Induction:   PONV Risk Score and Plan: 0  Airway Management Planned: Natural Airway and Simple Face Mask  Additional Equipment: None  Intra-op Plan:    Post-operative Plan:   Informed Consent: I have reviewed the patients History and Physical, chart, labs and discussed the procedure including the risks, benefits and alternatives for the proposed anesthesia with the patient or authorized representative who has indicated his/her understanding and acceptance.     Dental advisory given  Plan Discussed with: CRNA and Surgeon  Anesthesia Plan Comments: (Plan routine monitors, Supraclav block)       Anesthesia Quick Evaluation

## 2021-05-27 NOTE — Progress Notes (Signed)
Received message from pt's dtr, Reba. Returned call to pt's dtr this am. Dtr is interested in possible out-pt care at [place] if clinic has a MWF schedule available. Contacted [facility] admissions to check on availability at [place]. Clinic only has TTS available (10:35 or 12:40). Returned call to pt's dtr to discuss availability at [place]. Dtr wants to keep MWF appt at [facility]. Discussed out-pt arrangements with dtr via phone. Dtr voices understanding. Dtr plans to transport pt to HD at d/c. Dtr aware that she can speak to out-pt HD [facility] if pt needs transportation assistance in the future. Will assist as needed.  Melven Sartorius Renal Navigator [phone]

## 2021-05-27 NOTE — Progress Notes (Signed)
Mobility Specialist Progress Note    05/27/21 1458  Mobility  Activity Contraindicated/medical hold   Pt at procedure. Will f/u as schedule permits.   [facility] Mobility Specialist  M.S. 2C and 6E: [phone] M.S. 4E: [phone]

## 2021-05-27 NOTE — Transfer of Care (Signed)
Immediate Anesthesia Transfer of Care Note  Patient: Ruben [name]  Procedure(s) Performed: LEFT ARTERIOVENOUS (AV) GRAFT CREATION (Left: Arm Upper)  Patient Location: PACU  Anesthesia Type:MAC combined with regional for post-op pain  Level of Consciousness: awake, alert  and oriented  Airway & Oxygen Therapy: Patient Spontanous Breathing and Patient connected to nasal cannula oxygen  Post-op Assessment: Report given to RN, Post -op Vital signs reviewed and stable and Patient able to stick tongue midline  Post vital signs: Reviewed  Last Vitals:  Vitals Value Taken Time  BP 102/51   Temp 98.3   Pulse 67 05/27/21 1446  Resp 14 05/27/21 1446  SpO2 99 % 05/27/21 1446  Vitals shown include unvalidated device data.  Last Pain:  Vitals:   05/27/21 1230  TempSrc:   PainSc: 0-No pain      Patients Stated Pain Goal: 0 (47/15/95 3967)  Complications: No notable events documented.

## 2021-05-27 NOTE — Anesthesia Postprocedure Evaluation (Signed)
Anesthesia Post Note  Patient: Herbie Baltimore Junior Gilchrest  Procedure(s) Performed: LEFT ARTERIOVENOUS (AV) GRAFT CREATION (Left: Arm Upper)     Patient location during evaluation: PACU Anesthesia Type: Regional Level of consciousness: awake and alert, patient cooperative and oriented Pain management: pain level controlled Vital Signs Assessment: post-procedure vital signs reviewed and stable Respiratory status: spontaneous breathing, nonlabored ventilation and respiratory function stable Cardiovascular status: blood pressure returned to baseline and stable Postop Assessment: no apparent nausea or vomiting, able to ambulate and adequate PO intake Anesthetic complications: no   No notable events documented.  Last Vitals:  Vitals:   05/27/21 1609 05/27/21 1630  BP: (!) 100/54 (!) 113/49  Pulse: 66 66  Resp: 18 20  Temp:    SpO2: 99% 99%    Last Pain:  Vitals:   05/27/21 1600  TempSrc:   PainSc: 0-No pain                 Isaia Hassell,E. Kodie Kishi

## 2021-05-27 NOTE — TOC CM/SW Note (Signed)
HF TOC CM spoke to pt's dtr, Reba. Pt will need Rolling Walker with seat and 3n1 beside commode. [place] with new referral.Aeva Posey RN3 CCM, Heart Failure TOC CM [phone]

## 2021-05-28 ENCOUNTER — Other Ambulatory Visit (HOSPITAL_COMMUNITY): Payer: Self-pay

## 2021-05-28 ENCOUNTER — Encounter (HOSPITAL_COMMUNITY): Payer: Self-pay | Admitting: Vascular Surgery

## 2021-05-28 DIAGNOSIS — N186 End stage renal disease: Secondary | ICD-10-CM

## 2021-05-28 LAB — CBC
HCT: 35.2 % — ABNORMAL LOW (ref 39.0–52.0)
Hemoglobin: 12 g/dL — ABNORMAL LOW (ref 13.0–17.0)
MCH: 31.3 pg (ref 26.0–34.0)
MCHC: 34.1 g/dL (ref 30.0–36.0)
MCV: 91.9 fL (ref 80.0–100.0)
Platelets: 123 10*3/uL — ABNORMAL LOW (ref 150–400)
RBC: 3.83 MIL/uL — ABNORMAL LOW (ref 4.22–5.81)
RDW: 13.8 % (ref 11.5–15.5)
WBC: 7.4 10*3/uL (ref 4.0–10.5)
nRBC: 0 % (ref 0.0–0.2)

## 2021-05-28 LAB — BASIC METABOLIC PANEL
Anion gap: 12 (ref 5–15)
BUN: 34 mg/dL — ABNORMAL HIGH (ref 8–23)
CO2: 25 mmol/L (ref 22–32)
Calcium: 9.2 mg/dL (ref 8.9–10.3)
Chloride: 97 mmol/L — ABNORMAL LOW (ref 98–111)
Creatinine, Ser: 8.53 mg/dL — ABNORMAL HIGH (ref 0.61–1.24)
GFR, Estimated: 6 mL/min — ABNORMAL LOW (ref >60)
Glucose, Bld: 107 mg/dL — ABNORMAL HIGH (ref 70–99)
Potassium: 4.4 mmol/L (ref 3.5–5.1)
Sodium: 134 mmol/L — ABNORMAL LOW (ref 135–145)

## 2021-05-28 LAB — GLUCOSE, CAPILLARY
Glucose-Capillary: 112 mg/dL — ABNORMAL HIGH (ref 70–99)
Glucose-Capillary: 147 mg/dL — ABNORMAL HIGH (ref 70–99)

## 2021-05-28 MED ORDER — AMIODARONE HCL 200 MG PO TABS
200.0000 mg | ORAL_TABLET | Freq: Every day | ORAL | 6 refills | Status: DC
  Filled 2021-05-28: qty 30, 30d supply, fill #0

## 2021-05-28 MED ORDER — RENA-VITE PO TABS
1.0000 | ORAL_TABLET | Freq: Every day | ORAL | 6 refills | Status: DC
  Filled 2021-05-28: qty 30, 30d supply, fill #0

## 2021-05-28 MED ORDER — ATORVASTATIN CALCIUM 80 MG PO TABS
80.0000 mg | ORAL_TABLET | Freq: Every day | ORAL | 6 refills | Status: DC
  Filled 2021-05-28: qty 30, 30d supply, fill #0

## 2021-05-28 MED ORDER — APIXABAN 5 MG PO TABS
5.0000 mg | ORAL_TABLET | Freq: Two times a day (BID) | ORAL | 6 refills | Status: DC
  Filled 2021-05-28: qty 60, 30d supply, fill #0

## 2021-05-28 MED ORDER — MIDODRINE HCL 10 MG PO TABS
10.0000 mg | ORAL_TABLET | Freq: Three times a day (TID) | ORAL | 6 refills | Status: DC
Start: 2021-05-28 — End: 2021-08-26
  Filled 2021-05-28: qty 90, 30d supply, fill #0

## 2021-05-28 MED ORDER — NITROGLYCERIN 0.4 MG SL SUBL
0.4000 mg | SUBLINGUAL_TABLET | SUBLINGUAL | 3 refills | Status: AC | PRN
  Filled 2021-05-28: qty 100, 30d supply, fill #0

## 2021-05-28 MED ORDER — AMIODARONE HCL 200 MG PO TABS
200.0000 mg | ORAL_TABLET | Freq: Every day | ORAL | Status: DC
  Administered 2021-05-28: 11:00:00 200 mg via ORAL
  Filled 2021-05-28: qty 1

## 2021-05-28 MED ORDER — APIXABAN 5 MG PO TABS
5.0000 mg | ORAL_TABLET | Freq: Two times a day (BID) | ORAL | Status: DC
  Administered 2021-05-28: 11:00:00 5 mg via ORAL
  Filled 2021-05-28: qty 1

## 2021-05-28 NOTE — TOC Initial Note (Addendum)
[facility]) - Initial/Assessment Note    Patient Details  Name: Ruben [name] MRN: [id number] Date of Birth: 1945-05-29  Transition of Care [facility]) CM/SW Contact:    Erenest Rasher, RN Phone Number: [phone] 05/28/2021, 11:18 AM  Clinical Narrative:                 HF TOC CM spoke to pt and dtr, Reba at bedside. Pt slightly winded after his walk with mobility tech. TOC CM spoke to Unit RN and she completed a walking ambulation to check oxygen saturation. Pt has been on oxygen during hospital stay but oxygen sat are within range while resting. Attending made aware of decrease in sats while up walking. Oxygen ordered. [place] for oxygen for home. Dtr did take RW with seat and bsc home.   Expected Discharge Plan: Home/Self Care Barriers to Discharge: No Barriers Identified   Patient Goals and CMS Choice Patient states their goals for this hospitalization and ongoing recovery are:: to return home with family CMS Medicare.gov Compare Post Acute Care list provided to:: Patient    Expected Discharge Plan and Services Expected Discharge Plan: Home/Self Care In-house Referral: Clinical Social Work Discharge Planning Services: CM Consult   Living arrangements for the past 2 months: Single Family Home Expected Discharge Date: 05/28/21               DME Arranged: 3-N-1, Oxygen, Walker rolling with seat DME Agency: [employer] Date DME Agency Contacted: 05/28/21 Time DME Agency Contacted: 1117 Representative spoke with at DME Agency: Freda Munro            Prior Living Arrangements/Services Living arrangements for the past 2 months: [place] Lives with:: Spouse Patient language and need for interpreter reviewed:: Yes Do you feel safe going back to the place where you live?: Yes      Need for Family Participation in Patient Care: Yes (Comment) Care giver support system in place?: Yes (comment)   Criminal Activity/Legal Involvement  Pertinent to Current Situation/Hospitalization: No - Comment as needed  Activities of Daily Living Home Assistive Devices/Equipment: Eyeglasses, Dentures (specify type), Blood pressure cuff, Scales ADL Screening (condition at time of admission) Patient's cognitive ability adequate to safely complete daily activities?: Yes Is the patient deaf or have difficulty hearing?: No Does the patient have difficulty seeing, even when wearing glasses/contacts?: No Does the patient have difficulty concentrating, remembering, or making decisions?: No Patient able to express need for assistance with ADLs?: No Does the patient have difficulty dressing or bathing?: No Independently performs ADLs?: Yes (appropriate for developmental age) Does the patient have difficulty walking or climbing stairs?: No Weakness of Legs: Both Weakness of Arms/Hands: None  Permission Sought/Granted Permission sought to share information with : Case Manager, Family Supports, PCP Permission granted to share information with : Yes, Verbal Permission Granted  Share Information with NAME: Rashied Corallo     Permission granted to share info w Relationship: Significant other  Permission granted to share info w Contact Information: [phone]  [phone]  Emotional Assessment Appearance:: Appears stated age Attitude/Demeanor/Rapport: Engaged Affect (typically observed): Accepting Orientation: : Oriented to Place, Oriented to  Time, Oriented to Situation Alcohol / Substance Use: Alcohol Use Psych Involvement: No (comment)  Admission diagnosis:  NSTEMI (non-ST elevated myocardial infarction) ([place]) [I21.4] Atrial fibrillation with RVR (HCC) [I48.91] Acute myocardial infarction [facility]) [I21.9] Patient Active Problem List   Diagnosis Date Noted   Acute systolic heart failure ([place])    Atrial  fibrillation with RVR ([place])    Protein-calorie malnutrition, severe 05/24/2021   AKI (acute kidney injury) ([place])    Encounter for  central line placement    Cardiogenic shock ([place])    Acute myocardial infarction ([place]) 04/29/2021   Stage 3 chronic kidney disease ([place]) 07/12/2018   Prediabetes 07/12/2018   Right upper quadrant abdominal pain 08/10/2017   Right knee pain 02/15/2016   Right patella fracture 02/12/2016   HTN (hypertension) 02/21/2013   Tobacco abuse 02/21/2013   HLD (hyperlipidemia) 02/21/2013   PCP:  [name], [place] Pharmacy:   [facility] [place], [address] AT [address] [place] [address] Phone: [phone] Fax: [phone]  [facility] [address] [place] [address] Phone: [phone] Fax: [phone]     Social Determinants of Health (SDOH) Interventions Food Insecurity Interventions: Intervention Not Indicated Financial Strain Interventions: Other (Comment) (Pt.'s family reported some concerns about finances and lights or water getting turned off. CSW provided contact infor. to bring in shut off notice to see if hf team can help support.) Housing Interventions: Intervention Not Indicated Transportation Interventions: Intervention Not Indicated  Readmission Risk Interventions No flowsheet data found.

## 2021-05-28 NOTE — Plan of Care (Signed)
°  Problem: Education: Goal: Knowledge of General Education information will improve Description: Including pain rating scale, medication(s)/side effects and non-pharmacologic comfort measures Outcome: Progressing   Problem: Health Behavior/Discharge Planning: Goal: Ability to manage health-related needs will improve Outcome: Progressing   Problem: Clinical Measurements: Goal: Ability to maintain clinical measurements within normal limits will improve Outcome: Progressing Goal: Will remain free from infection Outcome: Progressing Goal: Diagnostic test results will improve Outcome: Progressing Goal: Cardiovascular complication will be avoided Outcome: Progressing   Problem: Activity: Goal: Risk for activity intolerance will decrease Outcome: Progressing   Problem: Nutrition: Goal: Adequate nutrition will be maintained Outcome: Progressing   Problem: Coping: Goal: Level of anxiety will decrease Outcome: Progressing   Problem: Elimination: Goal: Will not experience complications related to bowel motility Outcome: Progressing Goal: Will not experience complications related to urinary retention Outcome: Progressing   Problem: Pain Managment: Goal: General experience of comfort will improve Outcome: Progressing   Problem: Safety: Goal: Ability to remain free from injury will improve Outcome: Progressing   Problem: Skin Integrity: Goal: Risk for impaired skin integrity will decrease Outcome: Progressing   Problem: Education: Goal: Ability to demonstrate management of disease process will improve Outcome: Progressing Goal: Ability to verbalize understanding of medication therapies will improve Outcome: Progressing Goal: Individualized Educational Video(s) Outcome: Progressing   Problem: Activity: Goal: Capacity to carry out activities will improve Outcome: Progressing   Problem: Cardiac: Goal: Ability to achieve and maintain adequate cardiopulmonary perfusion will  improve Outcome: Progressing

## 2021-05-28 NOTE — Progress Notes (Signed)
Mobility Specialist Progress Note    05/28/21 0938  Mobility  Activity Ambulated independently in hallway  Level of Assistance Standby assist, set-up cues, supervision of patient - no hands on  Assistive Device None  Distance Ambulated (ft) 350 ft  Activity Response Tolerated well  $Mobility charge 1 Mobility   Pt received sitting EOB and agreeable. No complaints. Returned to sitting EOB with family present.   [facility] Mobility Specialist  M.S. 2C and 6E: [phone] M.S. 4E: [phone]

## 2021-05-28 NOTE — Progress Notes (Addendum)
Patient ID: Ruben [name], male   DOB: 03-09-1946, 76 y.o.   MRN: [id number]     Advanced Heart Failure Rounding Note  PCP-Cardiologist: Sanda Klein, MD   Subjective:   12/26: Admit with late presentation MI. New AF 12/28: CGS initial co-ox 47%. Started NE + mil. 01/03: SR 05/08/20 Back in A fib and milrinone was cut back to 0.125 mcg.  05/09/20 Milrinone increased back to 0.25. Volume overload, elevated filling pressures on RHC with preserved CO on milrinone. Hypertensive. Back in ICU on nitro gtt.  LHC 1/5: occluded RCA (culprit), diffuse disease in LAD and Lcx. Seen by CVTS. Not felt to be a candidate for surgery. 1/9: S/P TEE followed by DC/CV -->SR.  1/11: Afib w/ RVR>>amio gtt started 1/11: Moved to ICU for CVVH  1/13: Stopped CVVH 1/14: iHD 1/15: DBA stopped. Given IV lasix with 10 cc urine output.  1/16: iHD 1/18: iHD 1/20: iHD 1/23: S/P AV access  No complaints. Wants to go home   Objective:   Weight Range: 75.1 kg Body mass index is 23.09 kg/m.   Vital Signs:   Temp:  [97.7 F (36.5 C)-98.3 F (36.8 C)] 98.2 F (36.8 C) (01/24 0400) Pulse Rate:  [57-70] 63 (01/24 0400) Resp:  [14-24] 14 (01/24 0400) BP: (87-123)/(48-64) 90/51 (01/24 0400) SpO2:  [92 %-100 %] 95 % (01/24 0400) Weight:  [71.4 kg-75.1 kg] 75.1 kg (01/24 0500) Last BM Date: 05/26/21  Weight change: Filed Weights   05/27/21 1600 05/27/21 1912 05/28/21 0500  Weight: 72.9 kg 71.4 kg 75.1 kg    Intake/Output:   Intake/Output Summary (Last 24 hours) at 05/28/2021 0703 Last data filed at 05/27/2021 1912 Gross per 24 hour  Intake 144.17 ml  Output 1398 ml  Net -1253.83 ml      Physical Exam  General:  Sitting on the side of the bed. No resp difficulty HEENT: normal Neck: supple. no JVD. Carotids 2+ bilat; no bruits. No lymphadenopathy or thryomegaly appreciated. RIJ HD catheter Cor: PMI nondisplaced. Regular rate & rhythm. No rubs, gallops or murmurs. Lungs: clear Abdomen: soft,  nontender, nondistended. No hepatosplenomegaly. No bruits or masses. Good bowel sounds. Extremities: no cyanosis, clubbing, rash, edema. RUE PICC. LUE ace wrap.  Neuro: alert & orientedx3, cranial nerves grossly intact. moves all 4 extremities w/o difficulty. Affect pleasant    Telemetry   SR 60-70s with occasional PVCs. personally checked.   Labs    CBC Recent Labs    05/27/21 0748 05/28/21 0500  WBC 6.8 7.4  HGB 12.9* 12.0*  HCT 36.8* 35.2*  MCV 91.5 91.9  PLT 138* 539*   Basic Metabolic Panel Recent Labs    05/26/21 0853 05/27/21 0638 05/27/21 0748 05/28/21 0500  NA 131* 133* 132* 134*  K 4.2 4.5 4.8 4.4  CL 96* 96* 95* 97*  CO2 18* 23 26 25   GLUCOSE 124* 103* 111* 107*  BUN 46* 59* 60* 34*  CREATININE 10.78* 11.58* 12.10* 8.53*  CALCIUM 9.6 9.5 9.5 9.2  PHOS 4.8* 5.5*  --   --    Liver Function Tests Recent Labs    05/26/21 0853 05/27/21 0638  ALBUMIN 3.6 3.6    No results for input(s): LIPASE, AMYLASE in the last 72 hours. Cardiac Enzymes No results for input(s): CKTOTAL, CKMB, CKMBINDEX, TROPONINI in the last 72 hours.  BNP: BNP (last 3 results) Recent Labs    04/29/21 1251  BNP 1,659.6*    ProBNP (last 3 results) No results for input(s): PROBNP in  the last 8760 hours.   D-Dimer No results for input(s): DDIMER in the last 72 hours. Hemoglobin A1C No results for input(s): HGBA1C in the last 72 hours. Fasting Lipid Panel No results for input(s): CHOL, HDL, LDLCALC, TRIG, CHOLHDL, LDLDIRECT in the last 72 hours. Thyroid Function Tests No results for input(s): TSH, T4TOTAL, T3FREE, THYROIDAB in the last 72 hours.  Invalid input(s): FREET3  Other results:   Imaging    No results found.   Medications:     Scheduled Medications:  amiodarone  200 mg Oral BID   atorvastatin  80 mg Oral Daily   Chlorhexidine Gluconate Cloth  6 each Topical [id number]   folic acid  1 mg Oral Daily   heparin sodium (porcine)       mouth rinse  15 mL  Mouth Rinse BID   midodrine  10 mg Oral TID WC   multivitamin  1 tablet Oral QHS   ondansetron  4 mg Oral TID WC   senna-docusate  1 tablet Oral BID   sodium chloride flush  10-40 mL Intracatheter Q12H   sodium chloride flush  3 mL Intravenous Q12H   thiamine  100 mg Oral Daily    Infusions:  sodium chloride     sodium chloride     sodium chloride      PRN Medications: sodium chloride, sodium chloride, sodium chloride, acetaminophen, ALPRAZolam, alteplase, benzonatate, heparin, lidocaine (PF), lidocaine-prilocaine, ondansetron (ZOFRAN) IV, oxyCODONE-acetaminophen, pentafluoroprop-tetrafluoroeth, pentafluoroprop-tetrafluoroeth, polyethylene glycol, sodium chloride flush, sodium chloride flush    Assessment/Plan   1. CAD: Suspect delayed presentation LCx (versus RCA) MI.  Echo with EF 35-40%, inferolateral and basal inferior akinesis, moderate-severe eccentric likely infarct-related MR, normal RV.  He was not cathed initially due to AKI and delayed presentation. Eventual LHC with occluded RCA (culprit), diffuse moderate disease in LAD and Lcx. No good PCI targets. Not a candidate for CABG. No chest pain.  - Continue statin - Anticoagulated.  2. Acute systolic CHF/cardiogenic shock: Ischemic cardiomyopathy with infarct-related MR. Echo with EF 35-40%, inferolateral and basal inferior akinesis, moderate-severe eccentric likely infarct-related MR, normal RV.  Echo does not look bad enough to explain low output HF, concerned that MR may be playing a role.  TEE on 01/09 with EF 35-40%, moderate MR (suspect infarct-related), aortic dilatation measuring 44 mm, moderate AI. RHC on 0.25 milrinone showed RA mean 16, PA 66/26 (41), PCWP 27, PA 62%, Fick CO 5.04/ CI 2.56.  He was weaned off pressors but restarted dobutamine with persistently low co-ox.   CVVH stopped on 1/13 but started iHD. At this point, he is tolerating iHD - Continue midodrine 10 tid.  - HD for volume management.  3. Hyponatremia:  Managed via HD.  4. Valvular heart disease:  MR moderate in severity on TEE. Likely infarct related. Moderate AI.  5. Atrial fibrillation: First noted this admission.  S/P TEE followed DC-CV--> NSR.   - Initially in and out of AF, now maintaining SR.  - Cut back amio to 200 mg daily.  - Start eliquis 5 mg twice a day.   - Heparin gtt until access finalized then Eliquis.  6. AKI on CKD 3: Baseline creatinine around 1.4 in 6/22. Peak at 3.09 and trended down, now 2.2 -> 2.6 -> 3.1 > 4.0. > 5.3 > 6.58 >7.26 > CVVH started. 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 Renal US with no hydronephrosis. CVVH started 1/11 and stopped 1/13.  However, minimal UOP and creatinine rose so started iHD on 1/14.  Suspect he  will need iHD for now.  No urine output over the last 24 hours.   - S/P  AVF vs AVG with VVS - HD today - has M, W, F set up as outpatient 7. Active smoker: Needs to quit.  8. ETOH abuse: Needs to cut back.  9. Thrombocytopenia: Mild, pre-dates heparin initiation.  10. Hyperglycemia: - Continue SSI  - A1c 5.7  Home today. Outpatient HD set up. Will need PICC line out.  Will send meds [place].   Darrick Grinder, NP-C  05/28/2021 7:03 AM  Patient seen with NP, agree with the above note.   Stable today, ready for home.  Will start outpatient HD tomorrow.   Can decrease amiodarone to 200 mg daily at home and will start apixaban 5 mg bid.  For the time begin, he needs to continue midodrine.   Loralie Champagne 05/28/2021

## 2021-05-28 NOTE — Progress Notes (Signed)
[facility] Progress Note    Assessment/ Plan:    AKI on CKD 3b:normally sees Dr Posey Pronto.  On ARB as OP.  He has had both significant hyper- and hypotensive episodes during this hospitalization requiring treatment and a contrast load on 05/09/21.  Renal US without obstruction and R 6 mm renal calculus.  UA on admission bland.AKI component suspected ATN + CIN combination + cardiorenal sydrome - CRRT 05/15/21-05/17/21 now tolerating iHD with midodrine 10 TID support.  Last HD 1/23 1.4L UF.  Next HD hopefully as OP on Wed - he's had intermittent UOP but not reliably -Had [facility] placed 1/17 and AVG placed 1/23.   - At this point still AKI but given anuric status and moderate underlying CKD I'm less hopeful for recovery as he's nearing 3 weeks and is still anuric.    -HD yest- has MWF [facility] up outpt at [facility], can d/c as early as today -  that seems to be the plan    2.  Acute on chronic systolic CHF: EF 72-62%, previously on inotropes, now on midodrine and off  dobutamine.  CHF managing.    3.  CAD - LHC 05/09/21, 3V disease - not thought to be a candidate for surgery per CVTS   4.  Late-presenting MI - trops peaked > 20,000   5.  Afib: - s/p DCCV 05/13/21 - PO amiodarone - Eliquis --> hep gtt for now given access surgery.   6.  MR: thought to be infarct related  7.  Dispo: has MWF outpt HD arranged for [facility] when ready for d/c - not sure if low BPs will impact d/c  8. Anemia-  not an issue   9. Bones-  phos has not been major issue-  no binder-  no recent pth   Subjective:    HD yest-  removed 1388-  some low BPs after but being taken on his leg-  no dizziness-  also had AVG placed      Objective:   BP (!) 87/68 (BP Location: Right Leg)    Pulse 63    Temp 97.8 F (36.6 C) (Oral)    Resp 19    Ht 5\' 11"  (1.803 m)    Wt 75.1 kg    SpO2 96%    BMI 23.09 kg/m   Intake/Output Summary (Last 24 hours) at 05/28/2021 0355 Last data filed at 05/27/2021 1912 Gross per 24 hour  Intake  144.17 ml  Output 1398 ml  Net -1253.83 ml   Weight change: 1.9 kg  Physical Exam: GEN lying flat in bed, appears relatively comfortable HEENT no jaundice PULM clear CV RRR ABD soft EXT edema resolved ACCESS: R IJ TDC c/d/I-  left upper arm AVG-  great bruit   Imaging: No results found.  Labs: BMET Recent Labs  Lab 05/22/21 0435 05/23/21 0430 05/24/21 0400 05/25/21 0500 05/26/21 0853 05/27/21 0638 05/27/21 0748 05/28/21 0500  NA 132* 137 136 133* 131* 133* 132* 134*  K 4.2 4.2 4.3 4.2 4.2 4.5 4.8 4.4  CL 99 100 98 95* 96* 96* 95* 97*  CO2 22 26 25 25  18* 23 26 25   GLUCOSE 102* 107* 102* 99 124* 103* 111* 107*  BUN 37* 25* 39* 31* 46* 59* 60* 34*  CREATININE 8.25* 7.30* 9.84* 8.61* 10.78* 11.58* 12.10* 8.53*  CALCIUM 8.8* 9.3 9.7 9.6 9.6 9.5 9.5 9.2  PHOS 4.4 3.7 4.7* 5.1* 4.8* 5.5*  --   --    CBC Recent Labs  Lab 05/25/21 0500 05/26/21 0853 05/27/21 0748 05/28/21 0500  WBC 6.4 5.7 6.8 7.4  HGB 13.3 13.0 12.9* 12.0*  HCT 38.5* 38.1* 36.8* 35.2*  MCV 93.2 94.1 91.5 91.9  PLT 138* 126* 138* 123*    Medications:     amiodarone  200 mg Oral Daily   apixaban  5 mg Oral BID   atorvastatin  80 mg Oral Daily   Chlorhexidine Gluconate Cloth  6 each Topical T0626   folic acid  1 mg Oral Daily   mouth rinse  15 mL Mouth Rinse BID   midodrine  10 mg Oral TID WC   multivitamin  1 tablet Oral QHS   ondansetron  4 mg Oral TID WC   senna-docusate  1 tablet Oral BID   sodium chloride flush  10-40 mL Intracatheter Q12H   sodium chloride flush  3 mL Intravenous Q12H   thiamine  100 mg Oral Daily    Carlo Lorson A [facility] Pager [phone]

## 2021-05-28 NOTE — Consult Note (Signed)
° °  [facility] Inpatient Consult   05/28/2021  Ruben [name] 08-02-1945 [id number]  [place] [facility] [ACO] Patient: [name]  Patient was reviewed for [facility] 28 days hospitalization  Primary Care Provider:  [name], FNP is an embedded provider with a Chronic Care Management team and program, and is listed for the transition of care follow up and appointments.  Patient was screened for Embedded practice service needs for chronic care management. Spoke with patient and daughter at the bedside, Leonia Reeves, regarding benefits of Embedded provider. Gave patient a reminder card and 24 hour nurse advise line magnet.  Plan: A referral sent to the [facility] to see if patient qualifies for the chronic care for post hospital needs.  Please contact for further questions,  Natividad Brood, RN BSN [facility] Liaison  [phone] business mobile phone Toll free office [phone]  Fax number: [phone] [email or web address]

## 2021-05-28 NOTE — Progress Notes (Addendum)
°  Progress Note    05/28/2021 7:51 AM 1 Day Post-Op  Subjective:  no complaints   Vitals:   05/28/21 0400 05/28/21 0739  BP: (!) 90/51 (!) 87/68  Pulse: 63 63  Resp: 14 19  Temp: 98.2 F (36.8 C) 97.8 F (36.6 C)  SpO2: 95% 96%   Physical Exam: Cardiac:  regular Lungs:  non labored Extremities:  Left arm ACE in place. No appreciable edema. Good thrill in graft. 2+ radial pulse. Left hand warm and well perfused. 5/5 grip strength Neurologic: alert and oriented  CBC    Component Value Date/Time   WBC 7.4 05/28/2021 0500   RBC 3.83 (L) 05/28/2021 0500   HGB 12.0 (L) 05/28/2021 0500   HGB 15.3 07/23/2020 1049   HCT 35.2 (L) 05/28/2021 0500   HCT 43.9 07/23/2020 1049   PLT 123 (L) 05/28/2021 0500   PLT 145 (L) 07/23/2020 1049   MCV 91.9 05/28/2021 0500   MCV 92 07/23/2020 1049   MCH 31.3 05/28/2021 0500   MCHC 34.1 05/28/2021 0500   RDW 13.8 05/28/2021 0500   RDW 13.3 07/23/2020 1049   LYMPHSABS 1.3 04/29/2021 1251   MONOABS 0.3 04/29/2021 1251   EOSABS 0.0 04/29/2021 1251   BASOSABS 0.0 04/29/2021 1251    BMET    Component Value Date/Time   NA 134 (L) 05/28/2021 0500   NA 141 02/18/2021 1148   K 4.4 05/28/2021 0500   CL 97 (L) 05/28/2021 0500   CO2 25 05/28/2021 0500   GLUCOSE 107 (H) 05/28/2021 0500   BUN 34 (H) 05/28/2021 0500   BUN 28 (H) 02/18/2021 1148   CREATININE 8.53 (H) 05/28/2021 0500   CALCIUM 9.2 05/28/2021 0500   GFRNONAA 6 (L) 05/28/2021 0500   GFRAA 48 (L) 01/23/2020 1141    INR    Component Value Date/Time   INR 1.8 (H) 05/12/2021 0355     Intake/Output Summary (Last 24 hours) at 05/28/2021 0751 Last data filed at 05/27/2021 1912 Gross per 24 hour  Intake 144.17 ml  Output 1398 ml  Net -1253.83 ml     Assessment/Plan:  76 y.o. male is s/p Left AV Graft 1 Day Post-Op   Left arm well perfused. Mild incisional pain Graft has good thrill No signs of symptoms of steal Can remove ACE later today. Elevate arm PRN Okay from  vascular standpoint to transition back to Eliquis  He can follow up in 2-3 weeks for incisional check  Karoline Caldwell, PA-C [facility] [phone] 05/28/2021 7:51 AM

## 2021-05-28 NOTE — Progress Notes (Signed)
SATURATION QUALIFICATIONS: (This note is used to comply with regulatory documentation for home oxygen)  Patient Saturations on Room Air at Rest = 96%  Patient Saturations on Room Air while Ambulating = 85%  Patient Saturations on 2 Liters of oxygen while Ambulating = 95%  Please briefly explain why patient needs home oxygen: Patient desats while ambulating

## 2021-05-28 NOTE — Progress Notes (Signed)
Plans for d/c today noted. Spoke to pt's dtr, Reba, via phone to confirm understanding of out-pt HD arrangements. Dtr voices understanding and aware pt needs to arrive at clinic tomorrow at 10:15. Contacted [facility] [facility] and spoke to Quita Skye, RN to make clinic aware pt will start tomorrow. Contacted renal PA to request that orders be sent to clinic. Will assist as needed.   Melven Sartorius Renal Navigator [phone]

## 2021-05-29 ENCOUNTER — Encounter: Payer: Self-pay | Admitting: Nurse Practitioner

## 2021-05-29 ENCOUNTER — Telehealth: Payer: Self-pay | Admitting: Nephrology

## 2021-05-29 DIAGNOSIS — N186 End stage renal disease: Secondary | ICD-10-CM | POA: Diagnosis not present

## 2021-05-29 DIAGNOSIS — N2581 Secondary hyperparathyroidism of renal origin: Secondary | ICD-10-CM | POA: Diagnosis not present

## 2021-05-29 DIAGNOSIS — N179 Acute kidney failure, unspecified: Secondary | ICD-10-CM | POA: Diagnosis not present

## 2021-05-29 DIAGNOSIS — Z992 Dependence on renal dialysis: Secondary | ICD-10-CM | POA: Diagnosis not present

## 2021-05-29 DIAGNOSIS — D689 Coagulation defect, unspecified: Secondary | ICD-10-CM | POA: Diagnosis not present

## 2021-05-29 NOTE — Op Note (Addendum)
° ° °  NAME: Ruben [name]    MRN: [id number] DOB: 29-Oct-1945    DATE OF OPERATION: 05/29/2021  PREOP DIAGNOSIS:    End-stage renal disease  POSTOP DIAGNOSIS:    End-stage renal disease  PROCEDURE:    Left arm brachial artery to axillary vein arteriovenous graft  SURGEON: Broadus John  ASSIST: Paulo Fruit PA  ANESTHESIA: Moderate, block   EBL: 88ml  INDICATIONS:    Ruben [name] is a 76 y.o. male with end-stage renal disease and need of long-term HD access.  After discussing the risks and benefits of left-sided AV fistula versus AV graft, Ruben [name] elected to proceed  FINDINGS:   73mm axillary vein 27mm brachial artery  TECHNIQUE:   After informed consent was obtained, the patient was brought to the operating room, placed supine on the operating room table.  moderate anesthesia was used.  The patient was prepped and draped in normal sterile fashion.  Surgical time-out was taken. Pre-op antibiotics were given.  Procedure began with using ultrasound and sent the brachial artery and axillary vein in the left arm.  Both proved of sufficient size to continue with AV graft.  A longitudinal incision was made at the distal aspect of the humerus. The brachial artery was identified, gently dissected, and encircled with silastic loops. A second incision was then made over the axillary vein and similarly dissected and encircled. The tunneling device was then passed between these incisions. A 4 X 7 taper PTFE graft was passed through the tunnel, taking care to avoid kinking or twists. The graft was irrigated with heparinized saline solution.   Proximal and distal arterial control was then obtained and a longitudinal arteriotomy was made on the brachial artery. The artery was irrigated with heparinized saline solution. An end to side artery anastomosis was then performed from the 53mm graft to the brachial vein in a running fashion using running 6-0 prolene suture. When the  clamps were removed from the artery, the graft demonstrated excellent inflow. It was flushed with heparinized saline prior to being clamped. There was an excellent  signal at the wrist.    Attention was then directed to the axillary vein anastomosis. Proximal and distal control were obtained and a longitudinal venotomy was made. The vein was then irrigated with heparinized saline solution. The end of the PTFE graft was then cut in a beveled fashion at the appropriate length. An end to side vein anastomosis was then performed, using a running, 6-0 prolene suture. Prior to establishing flow, all vessels were back bled and flushed to ensure there was no clot or air. My first [place] participation was critical in the above.  There was a strong distal signal in the radial and ulnar arteries and a thrill in the vein centrally. Hemostasis was found to be satisfactory, and all wounds were irrigated with saline solution and closed in layers with vicryl and Monocryl at the skin. A dry sterile dressing was applied. Anesthetic care was terminated. The patient was transported to the recovery room in stable condition.  Given the complexity of the case a first assistant was necessary in order to expedient the procedure and safely perform the technical aspects of the operation.  Macie Burows, MD [facility]  DATE OF DICTATION:   05/29/2021

## 2021-05-29 NOTE — Telephone Encounter (Signed)
Transition of care contact from inpatient facility  Date of Discharge: 05/28/21 Date of Contact: 05/29/21 -attempt  Method of contact: Phone  Attempted to contact patient to discuss transition of care from inpatient admission. Patient did not answer the phone. Unable to leave VM.

## 2021-05-30 ENCOUNTER — Encounter: Payer: Self-pay | Admitting: Nurse Practitioner

## 2021-05-30 ENCOUNTER — Telehealth: Payer: Self-pay

## 2021-05-30 ENCOUNTER — Other Ambulatory Visit: Payer: Self-pay | Admitting: Nurse Practitioner

## 2021-05-30 DIAGNOSIS — K649 Unspecified hemorrhoids: Secondary | ICD-10-CM

## 2021-05-30 DIAGNOSIS — I5021 Acute systolic (congestive) heart failure: Secondary | ICD-10-CM

## 2021-05-30 DIAGNOSIS — I4891 Unspecified atrial fibrillation: Secondary | ICD-10-CM

## 2021-05-30 DIAGNOSIS — K5900 Constipation, unspecified: Secondary | ICD-10-CM

## 2021-05-30 MED ORDER — PHENYLEPHRINE IN HARD FAT 0.25 % RE SUPP: 1.0000 | Freq: Two times a day (BID) | RECTAL | 0 refills | Status: DC

## 2021-05-30 NOTE — Telephone Encounter (Addendum)
Transition Care Management Follow-up Telephone Call Date of discharge and from where: 05/28/20[facility]   How have you been since you were released from the hospital? Pt daughters reports, not eating, bad anxiety, tylenol 250MG  taken at home OTC seems to not give the pt comfort at all.  He experiences nauseation, no sleep, anxiety & pain. Last time eaten today was 2 hours ago. Breakfast smoothie.   Any questions or concerns? No  Items Reviewed: Did the pt receive and understand the discharge instructions provided? Yes  Medications obtained and verified? Yes  Other? Yes  Any new allergies since your discharge? No  Dietary orders reviewed? Yes Do you have support at home? Yes   Home Care and Equipment/Supplies: Were home health services ordered? no If so, what is the name of the agency? N/a   Has the agency set up a time to come to the patient's home? no Were any new equipment or medical supplies ordered?  No What is the name of the medical supply agency? N/a  Were you able to get the supplies/equipment? no Do you have any questions related to the use of the equipment or supplies? No  Functional Questionnaire: (I = Independent and D = Dependent) ADLs: D  Bathing/Dressing- D  Meal Prep- D  Eating- D  Maintaining continence- D  Transferring/Ambulation- D  Managing Meds- D  Follow up appointments reviewed:  PCP Hospital f/u appt confirmed? No  Scheduled to see n/a on n/a @ n/a . [facility] f/u appt confirmed? No  Scheduled to see n/a on n/a @ n/a. Are transportation arrangements needed? No  If their condition worsens, is the pt aware to call PCP or go to the Emergency Dept.? Yes Was the patient provided with contact information for the PCP's office or ED? Yes Was to pt encouraged to call back with questions or concerns? Yes

## 2021-05-31 DIAGNOSIS — N2581 Secondary hyperparathyroidism of renal origin: Secondary | ICD-10-CM | POA: Diagnosis not present

## 2021-05-31 DIAGNOSIS — D689 Coagulation defect, unspecified: Secondary | ICD-10-CM | POA: Diagnosis not present

## 2021-05-31 DIAGNOSIS — N179 Acute kidney failure, unspecified: Secondary | ICD-10-CM | POA: Diagnosis not present

## 2021-05-31 DIAGNOSIS — E43 Unspecified severe protein-calorie malnutrition: Secondary | ICD-10-CM | POA: Diagnosis not present

## 2021-05-31 DIAGNOSIS — I5021 Acute systolic (congestive) heart failure: Secondary | ICD-10-CM | POA: Diagnosis not present

## 2021-05-31 DIAGNOSIS — Z992 Dependence on renal dialysis: Secondary | ICD-10-CM | POA: Diagnosis not present

## 2021-06-02 ENCOUNTER — Encounter: Payer: Self-pay | Admitting: Emergency Medicine

## 2021-06-02 ENCOUNTER — Emergency Department (HOSPITAL_BASED_OUTPATIENT_CLINIC_OR_DEPARTMENT_OTHER): Payer: Medicare Other

## 2021-06-02 ENCOUNTER — Emergency Department (HOSPITAL_BASED_OUTPATIENT_CLINIC_OR_DEPARTMENT_OTHER)
Admission: EM | Admit: 2021-06-02 | Discharge: 2021-06-02 | Disposition: A | Discharge status: Home / Self Care | Payer: Medicare Other | Attending: Emergency Medicine | Admitting: Emergency Medicine

## 2021-06-02 ENCOUNTER — Encounter (HOSPITAL_BASED_OUTPATIENT_CLINIC_OR_DEPARTMENT_OTHER): Payer: Self-pay | Admitting: Emergency Medicine

## 2021-06-02 ENCOUNTER — Ambulatory Visit
Admission: EM | Admit: 2021-06-02 | Discharge: 2021-06-02 | Disposition: A | Discharge status: Home / Self Care | Payer: Medicare Other

## 2021-06-02 ENCOUNTER — Other Ambulatory Visit: Payer: Self-pay

## 2021-06-02 DIAGNOSIS — Z79899 Other long term (current) drug therapy: Secondary | ICD-10-CM | POA: Insufficient documentation

## 2021-06-02 DIAGNOSIS — I132 Hypertensive heart and chronic kidney disease with heart failure and with stage 5 chronic kidney disease, or end stage renal disease: Secondary | ICD-10-CM | POA: Insufficient documentation

## 2021-06-02 DIAGNOSIS — Z992 Dependence on renal dialysis: Secondary | ICD-10-CM | POA: Diagnosis not present

## 2021-06-02 DIAGNOSIS — K838 Other specified diseases of biliary tract: Secondary | ICD-10-CM | POA: Diagnosis not present

## 2021-06-02 DIAGNOSIS — Z7951 Long term (current) use of inhaled steroids: Secondary | ICD-10-CM | POA: Insufficient documentation

## 2021-06-02 DIAGNOSIS — I5020 Unspecified systolic (congestive) heart failure: Secondary | ICD-10-CM | POA: Insufficient documentation

## 2021-06-02 DIAGNOSIS — R1084 Generalized abdominal pain: Secondary | ICD-10-CM | POA: Diagnosis not present

## 2021-06-02 DIAGNOSIS — Z7901 Long term (current) use of anticoagulants: Secondary | ICD-10-CM | POA: Insufficient documentation

## 2021-06-02 DIAGNOSIS — I700 Atherosclerosis of aorta: Secondary | ICD-10-CM | POA: Diagnosis not present

## 2021-06-02 DIAGNOSIS — R109 Unspecified abdominal pain: Secondary | ICD-10-CM | POA: Diagnosis not present

## 2021-06-02 DIAGNOSIS — N186 End stage renal disease: Secondary | ICD-10-CM | POA: Diagnosis not present

## 2021-06-02 DIAGNOSIS — K6289 Other specified diseases of anus and rectum: Secondary | ICD-10-CM | POA: Diagnosis not present

## 2021-06-02 DIAGNOSIS — I120 Hypertensive chronic kidney disease with stage 5 chronic kidney disease or end stage renal disease: Secondary | ICD-10-CM | POA: Diagnosis not present

## 2021-06-02 DIAGNOSIS — K5730 Diverticulosis of large intestine without perforation or abscess without bleeding: Secondary | ICD-10-CM | POA: Diagnosis not present

## 2021-06-02 DIAGNOSIS — R0600 Dyspnea, unspecified: Secondary | ICD-10-CM | POA: Insufficient documentation

## 2021-06-02 DIAGNOSIS — N2889 Other specified disorders of kidney and ureter: Secondary | ICD-10-CM | POA: Diagnosis not present

## 2021-06-02 DIAGNOSIS — K5900 Constipation, unspecified: Secondary | ICD-10-CM | POA: Diagnosis not present

## 2021-06-02 DIAGNOSIS — R0789 Other chest pain: Secondary | ICD-10-CM | POA: Diagnosis not present

## 2021-06-02 HISTORY — DX: End stage renal disease: N18.6

## 2021-06-02 LAB — CBC WITH DIFFERENTIAL/PLATELET
Abs Immature Granulocytes: 0.03 10*3/uL (ref 0.00–0.07)
Basophils Absolute: 0 10*3/uL (ref 0.0–0.1)
Basophils Relative: 0 %
Eosinophils Absolute: 0.2 10*3/uL (ref 0.0–0.5)
Eosinophils Relative: 2 %
HCT: 32.6 % — ABNORMAL LOW (ref 39.0–52.0)
Hemoglobin: 10.8 g/dL — ABNORMAL LOW (ref 13.0–17.0)
Immature Granulocytes: 0 %
Lymphocytes Relative: 16 %
Lymphs Abs: 1.5 10*3/uL (ref 0.7–4.0)
MCH: 31.1 pg (ref 26.0–34.0)
MCHC: 33.1 g/dL (ref 30.0–36.0)
MCV: 93.9 fL (ref 80.0–100.0)
Monocytes Absolute: 1 10*3/uL (ref 0.1–1.0)
Monocytes Relative: 11 %
Neutro Abs: 6.4 10*3/uL (ref 1.7–7.7)
Neutrophils Relative %: 71 %
Platelets: 143 10*3/uL — ABNORMAL LOW (ref 150–400)
RBC: 3.47 MIL/uL — ABNORMAL LOW (ref 4.22–5.81)
RDW: 14.5 % (ref 11.5–15.5)
WBC: 9 10*3/uL (ref 4.0–10.5)
nRBC: 0 % (ref 0.0–0.2)

## 2021-06-02 LAB — TROPONIN I (HIGH SENSITIVITY)
Troponin I (High Sensitivity): 56 ng/L — ABNORMAL HIGH (ref <18)
Troponin I (High Sensitivity): 55 ng/L — ABNORMAL HIGH (ref <18)

## 2021-06-02 LAB — COMPREHENSIVE METABOLIC PANEL
ALT: 5 U/L (ref 0–44)
AST: 20 U/L (ref 15–41)
Albumin: 4.1 g/dL (ref 3.5–5.0)
Alkaline Phosphatase: 53 U/L (ref 38–126)
Anion gap: 14 (ref 5–15)
BUN: 36 mg/dL — ABNORMAL HIGH (ref 8–23)
CO2: 29 mmol/L (ref 22–32)
Calcium: 10 mg/dL (ref 8.9–10.3)
Chloride: 95 mmol/L — ABNORMAL LOW (ref 98–111)
Creatinine, Ser: 7.68 mg/dL — ABNORMAL HIGH (ref 0.61–1.24)
GFR, Estimated: 7 mL/min — ABNORMAL LOW (ref >60)
Glucose, Bld: 107 mg/dL — ABNORMAL HIGH (ref 70–99)
Potassium: 4.2 mmol/L (ref 3.5–5.1)
Sodium: 138 mmol/L (ref 135–145)
Total Bilirubin: 0.7 mg/dL (ref 0.3–1.2)
Total Protein: 7.3 g/dL (ref 6.5–8.1)

## 2021-06-02 LAB — LIPASE, BLOOD: Lipase: 50 U/L (ref 11–51)

## 2021-06-02 LAB — BRAIN NATRIURETIC PEPTIDE: B Natriuretic Peptide: 3498.9 pg/mL — ABNORMAL HIGH (ref 0.0–100.0)

## 2021-06-02 MED ORDER — POLYETHYLENE GLYCOL 3350 17 GM/SCOOP PO POWD: 1.0000 | Freq: Once | ORAL | 0 refills | Status: AC

## 2021-06-02 MED ORDER — SODIUM CHLORIDE 0.9 % IV BOLUS
500.0000 mL | Freq: Once | INTRAVENOUS | Status: AC
  Administered 2021-06-02: 500 mL via INTRAVENOUS

## 2021-06-02 MED ORDER — LIDOCAINE-HYDROCORTISONE ACE 3-1 % RE KIT: 1.0000 "application " | PACK | Freq: Two times a day (BID) | RECTAL | 0 refills | Status: DC

## 2021-06-02 MED ORDER — ONDANSETRON HCL 4 MG/2ML IJ SOLN
4.0000 mg | Freq: Once | INTRAMUSCULAR | Status: AC
Start: 2021-06-02 — End: 2021-06-02
  Administered 2021-06-02: 4 mg via INTRAVENOUS
  Filled 2021-06-02: qty 2

## 2021-06-02 MED ORDER — BARIUM SULFATE 2.1 % PO SUSP
450.0000 mL | Freq: Once | ORAL | Status: AC
  Administered 2021-06-02: 450 mL via ORAL

## 2021-06-02 MED ORDER — MORPHINE SULFATE (PF) 2 MG/ML IV SOLN
2.0000 mg | Freq: Once | INTRAVENOUS | Status: AC
  Administered 2021-06-02: 2 mg via INTRAVENOUS
  Filled 2021-06-02: qty 1

## 2021-06-02 NOTE — ED Provider Notes (Signed)
[facility] EMERGENCY DEPT Provider Note   CSN: [id number] Arrival date & time: 06/02/21  6295     History  Chief Complaint  Patient presents with   Constipation    Ruben [name] is a 76 y.o. male.  This is a 76 y.o. male with significant medical history as below, including ESRD on HD Monday Wednesday Friday with last dialysis on Friday; prior MI, who presents to the ED with complaint of constipation, hemorrhoid pain, mild dyspnea.  Patient reports last full bowel movement was approximately 1 to 2 weeks ago.  He has history of recurrent constipation.  He had a small bowel movement this morning, small amount of stool, no diarrhea.  No blood in the stool.  No melena.  He has some mild discomfort in his rectum with straining for bowel movement that he attributes to a hemorrhoid.  Patient took a partial Fleet enema 3 to 4 days ago without resolution of his constipation.  Wife has been applying topical remedies for his hemorrhoids.  He has seen PCP for this prior multiple years ago.  No vomiting.  No nausea.  Mild dyspnea on exertion, patient was started on 2 L nasal cannula as needed.  He is using 2 L nasal cannula and feels like he is breathing comfortably.  Family feels like his breathing is mildly worse than his baseline.  Accompanied by daughter and spouse.  Rectal pain described as tightness, pressure, stabbing.  Nonradiating.  Nephrologist is Dr. Phillis Knack  Patient with CKD, started outpatient hemodialysis 1/25.  He has tunneled catheter as his access point.   Patient with systolic congestive heart failure, recent admission for cardiogenic shock, ischemic cardiomyopathy.  Low output heart failure ejection fraction 35 to 40%.     Past Medical History: No date: Arthritis No date: Chronic kidney disease, stage 3b (HCC) No date: ESRD (end stage renal disease) ([place]) No date: ETOH abuse 09/02/2008: History of stress test     Comment:  showed inferolateral scar  without ischemia 09/02/2008: Hx of echocardiogram     Comment:  was essentially normal No date: Hyperlipidemia No date: Hypertension No date: Myocardial infarction ([place]) No date: Tobacco abuse  Past Surgical History: 05/27/2021: AV FISTULA PLACEMENT; Left     Comment:  Procedure: LEFT ARTERIOVENOUS (AV) GRAFT CREATION;                Surgeon: Broadus John, MD;  Location: [facility];                Service: Vascular;  Laterality: Left;  PERIPHERAL NERVE               BLOCK No date: BACK SURGERY     Comment:  Dr Luiz Ochoa involving L3-L4 discectomy. 05/13/2021: CARDIOVERSION; N/A     Comment:  Procedure: CARDIOVERSION;  Surgeon: Larey Dresser,               MD;  Location: [facility] ENDOSCOPY;  Service: Cardiovascular;                Laterality: N/A; No date: HERNIA REPAIR 05/21/2021: IR FLUORO GUIDE CV LINE RIGHT 05/21/2021: IR US GUIDE VASC ACCESS RIGHT 05/09/2021: RIGHT/LEFT HEART CATH AND CORONARY ANGIOGRAPHY; N/A     Comment:  Procedure: RIGHT/LEFT HEART CATH AND CORONARY               ANGIOGRAPHY;  Surgeon: Larey Dresser, MD;  Location:  [facility] INVASIVE CV LAB;  Service: Cardiovascular;                Laterality: N/A; No date: SPINE SURGERY 05/07/2021: TEE WITHOUT CARDIOVERSION; N/A     Comment:  Procedure: TRANSESOPHAGEAL ECHOCARDIOGRAM (TEE);                Surgeon: Larey Dresser, MD;  Location: [facility] ENDOSCOPY;                Service: Cardiovascular;  Laterality: N/A; 05/13/2021: TEE WITHOUT CARDIOVERSION; N/A     Comment:  Procedure: TRANSESOPHAGEAL ECHOCARDIOGRAM (TEE);                Surgeon: Larey Dresser, MD;  Location: [facility] ENDOSCOPY;                Service: Cardiovascular;  Laterality: N/A;    The history is provided by the patient, the spouse and a relative. No language interpreter was used.  Constipation Associated symptoms: abdominal pain   Associated symptoms: no fever, no nausea and no vomiting       Home Medications Prior to Admission medications    Medication Sig Start Date End Date Taking? Authorizing Provider  lidocaine-hydrocortisone (ANAMANTLE) 3-1 % KIT Place 1 application rectally 2 (two) times daily. 06/02/21  Yes Wynona Dove A, DO  polyethylene glycol powder (GLYCOLAX/MIRALAX) 17 GM/SCOOP powder Take 255 g by mouth once for 1 dose. 06/03/21 06/03/21 Yes Jeanell Sparrow, DO  acetaminophen (TYLENOL) 650 MG CR tablet Take 650 mg by mouth every 8 (eight) hours as needed for pain.    [provider]  albuterol (PROVENTIL) (2.5 MG/3ML) 0.083% nebulizer solution Take 3 mLs (2.5 mg total) by nebulization every 4 (four) hours as needed for wheezing or shortness of breath. Patient not taking: Reported on 04/29/2021 04/03/21 04/03/22  [name], FNP  amiodarone (PACERONE) 200 MG tablet Take 1 tablet (200 mg total) by mouth daily. 05/28/21   Clegg, Amy D, NP  apixaban (ELIQUIS) 5 MG TABS tablet Take 1 tablet (5 mg total) by mouth 2 (two) times daily. 05/28/21   Clegg, Amy D, NP  atorvastatin (LIPITOR) 80 MG tablet Take 1 tablet (80 mg total) by mouth daily. 05/28/21   Clegg, Amy D, NP  cholecalciferol (VITAMIN D3) 25 MCG (1000 UNIT) tablet Take 1,000 Units by mouth daily.    [provider]  diclofenac Sodium (VOLTAREN) 1 % GEL Apply 2 g topically 4 (four) times daily. Patient taking differently: Apply 2 g topically daily as needed (pain). 10/24/19   [name], FNP  Magnesium 200 MG TABS Take 1 tablet (200 mg total) by mouth daily. With evening meal Patient taking differently: Take 200 mg by mouth daily. 10/22/20   [name], FNP  midodrine (PROAMATINE) 10 MG tablet Take 1 tablet (10 mg total) by mouth 3 (three) times daily with meals. 05/28/21   Clegg, Amy D, NP  multivitamin (RENA-VIT) TABS tablet Take 1 tablet by mouth at bedtime. 05/28/21   Clegg, Amy D, NP  nitroGLYCERIN (NITROSTAT) 0.4 MG SL tablet Place 1 tablet (0.4 mg total) under the tongue every 5 (five) minutes as needed for chest pain. 05/28/21 05/28/22  Clegg, Amy  D, NP  phenylephrine (,USE FOR PREPARATION-H,) 0.25 % suppository Place 1 suppository rectally 2 (two) times daily. 05/30/21   [name], FNP  polyethylene glycol powder (GLYCOLAX/MIRALAX) powder Take 17 g by mouth 2 (two) times daily as needed. Patient not taking: Reported on 04/29/2021 03/04/16  McVey, Gelene Mink, PA-C      Allergies    Other    Review of Systems   Review of Systems  Constitutional:  Negative for chills and fever.  HENT:  Negative for facial swelling and trouble swallowing.   Eyes:  Negative for photophobia and visual disturbance.  Respiratory:  Positive for shortness of breath. Negative for cough.   Cardiovascular:  Negative for chest pain and palpitations.  Gastrointestinal:  Positive for abdominal pain, constipation and rectal pain. Negative for nausea and vomiting.  Endocrine: Negative for polydipsia and polyuria.  Genitourinary:  Negative for difficulty urinating and hematuria.  Musculoskeletal:  Negative for gait problem and joint swelling.  Skin:  Negative for pallor and rash.  Neurological:  Negative for syncope and headaches.  Psychiatric/Behavioral:  Negative for agitation and confusion.    Physical Exam Updated Vital Signs BP (!) 100/57    Pulse 77    Temp 98 F (36.7 C) (Oral)    Resp 16    SpO2 100%  Physical Exam Vitals and nursing note reviewed. Exam conducted with a chaperone present.  Constitutional:      General: He is not in acute distress.    Appearance: He is well-developed.     Comments: frail  HENT:     Head: Normocephalic and atraumatic.     Right Ear: External ear normal.     Left Ear: External ear normal.     Mouth/Throat:     Mouth: Mucous membranes are moist.  Eyes:     General: No scleral icterus. Cardiovascular:     Rate and Rhythm: Normal rate and regular rhythm.     Pulses: Normal pulses.     Heart sounds: Normal heart sounds.  Pulmonary:     Effort: Pulmonary effort is normal. No respiratory distress.      Breath sounds: Normal breath sounds.  Chest:    Abdominal:     General: Abdomen is flat.     Palpations: Abdomen is soft.     Tenderness: There is no abdominal tenderness.  Genitourinary:    Comments: No obvious external hemorrhoid, no frank bleeding from rectum.  No rashes.  Possible internal hemorrhoid Musculoskeletal:        General: Normal range of motion.       Arms:     Cervical back: Normal range of motion.     Right lower leg: No edema.     Left lower leg: No edema.  Skin:    General: Skin is warm and dry.     Capillary Refill: Capillary refill takes less than 2 seconds.  Neurological:     Mental Status: He is alert and oriented to person, place, and time.  Psychiatric:        Mood and Affect: Mood normal.        Behavior: Behavior normal.    ED Results / Procedures / Treatments   Labs (all labs ordered are listed, but only abnormal results are displayed) Labs Reviewed  CBC WITH DIFFERENTIAL/PLATELET - Abnormal; Notable for the following components:      Result Value   RBC 3.47 (*)    Hemoglobin 10.8 (*)    HCT 32.6 (*)    Platelets 143 (*)    All other components within normal limits  BRAIN NATRIURETIC PEPTIDE - Abnormal; Notable for the following components:   B Natriuretic Peptide 3,498.9 (*)    All other components within normal limits  COMPREHENSIVE METABOLIC PANEL - Abnormal; Notable for the  following components:   Chloride 95 (*)    Glucose, Bld 107 (*)    BUN 36 (*)    Creatinine, Ser 7.68 (*)    GFR, Estimated 7 (*)    All other components within normal limits  TROPONIN I (HIGH SENSITIVITY) - Abnormal; Notable for the following components:   Troponin I (High Sensitivity) 56 (*)    All other components within normal limits  TROPONIN I (HIGH SENSITIVITY) - Abnormal; Notable for the following components:   Troponin I (High Sensitivity) 55 (*)    All other components within normal limits  LIPASE, BLOOD  URINALYSIS, ROUTINE W REFLEX MICROSCOPIC     EKG None  Radiology CT ABDOMEN PELVIS WO CONTRAST  Result Date: 06/02/2021 CLINICAL DATA:  76 year old male with history of acute onset of nonlocalized abdominal pain. Abdominal distension. Constipation for 1 week. EXAM: CT ABDOMEN AND PELVIS WITHOUT CONTRAST TECHNIQUE: Multidetector CT imaging of the abdomen and pelvis was performed following the standard protocol without IV contrast. RADIATION DOSE REDUCTION: This exam was performed according to the departmental dose-optimization program which includes automated exposure control, adjustment of the mA and/or kV according to patient size and/or use of iterative reconstruction technique. COMPARISON:  CT the abdomen and pelvis 08/10/2017. FINDINGS: Lower chest: Atherosclerotic calcifications in the thoracic aorta as well as the left main, left anterior descending, left circumflex and right coronary arteries. Central venous catheter tip terminating in the right atrium. Hepatobiliary: Multiple low-attenuation lesions scattered throughout the liver, similar in size and number to prior study, incompletely characterized on today's noncontrast CT examination, but likely to represent cysts. There is some amorphous high attenuation material lying dependently in the gallbladder, compatible with biliary sludge and/or tiny partially calcified gallstones. Gallbladder is not distended. No pericholecystic fluid or surrounding inflammatory changes to suggest an acute cholecystitis at this time. Pancreas: No definite pancreatic mass or peripancreatic fluid collections or inflammatory changes are noted on today's noncontrast CT examination. Spleen: Unremarkable. Adrenals/Urinary Tract: Low-attenuation lesions in the kidneys bilaterally measuring up to 1.7 cm in the upper pole of the right kidney, incompletely characterized on today's non-contrast CT examination, but statistically likely to represent cysts. In addition, in the lateral aspect of the interpolar region of the  left kidney there is a 1 cm high attenuation lesion which is incompletely characterize, but likely a proteinaceous/hemorrhagic cyst. Bilateral adrenal glands are normal in appearance. No hydroureteronephrosis. Urinary bladder is normal in appearance. Stomach/Bowel: Unenhanced appearance of the stomach is normal. There is no pathologic dilatation of small bowel or colon. A few scattered colonic diverticulae are noted, without surrounding inflammatory changes to suggest an acute diverticulitis at this time. Normal appendix. Vascular/Lymphatic: Aortic atherosclerosis, aortic atherosclerosis. No lymphadenopathy noted in the abdomen or pelvis. Reproductive: Prostate gland and seminal vesicles are unremarkable in appearance. Other: No significant volume of ascites.  No pneumoperitoneum. Musculoskeletal: There are no aggressive appearing lytic or blastic lesions noted in the visualized portions of the skeleton. IMPRESSION: 1. No acute findings are noted in the abdomen or pelvis to account for the patient's symptoms. 2. Colonic diverticulosis without evidence of acute diverticulitis at this time. 3. Numerous renal lesions bilaterally, incompletely characterized on today's noncontrast examination, but likely to represent a combination of small cysts and proteinaceous/hemorrhagic cysts. These could be definitively characterized with follow-up nonemergent abdominal MRI with and without IV gadolinium if of clinical concern. 4. Biliary sludge and/or tiny partially calcified gallstones lying dependently in the gallbladder. No findings to suggest an acute cholecystitis at this time.  5. Aortic atherosclerosis, in addition to left main and three-vessel coronary artery disease. Assessment for potential risk factor modification, dietary therapy or pharmacologic therapy may be warranted, if clinically indicated. 6. Additional incidental findings, as above. Electronically Signed   By: Vinnie Langton M.D.   On: 06/02/2021 10:27   DG  Chest Port 1 View  Result Date: 06/02/2021 CLINICAL DATA:  Pt presents with constipation. No bm for a week, lots of rectal pain/pressure. Pt was hospitalized with heart attack in December and just dc recently due to kidney damage. Is new dialysis. EXAM: PORTABLE CHEST 1 VIEW COMPARISON:  05/15/2021. FINDINGS: Cardiac silhouette is normal in size. No mediastinal or hilar masses. Clear lungs.  No convincing pleural effusion and no pneumothorax. Right internal jugular tunneled dual lumen central venous catheter is new, tip projects in the lower superior vena cava. Skeletal structures are grossly intact. IMPRESSION: No acute cardiopulmonary disease. Electronically Signed   By: Lajean Manes M.D.   On: 06/02/2021 10:25    Procedures .Critical Care Performed by: Jeanell Sparrow, DO Authorized by: Jeanell Sparrow, DO   Critical care provider statement:    Critical care time (minutes):  30   Critical care time was exclusive of:  Separately billable procedures and treating other patients   Critical care was necessary to treat or prevent imminent or life-threatening deterioration of the following conditions:  Cardiac failure and renal failure   Critical care was time spent personally by me on the following activities:  Development of treatment plan with patient or surrogate, discussions with consultants, evaluation of patient's response to treatment, examination of patient, ordering and review of laboratory studies, ordering and review of radiographic studies, ordering and performing treatments and interventions, pulse oximetry, re-evaluation of patient's condition and review of old charts Comments:     Elevated troponin and BNP, d/w consultant    Medications Ordered in ED Medications  sodium chloride 0.9 % bolus 500 mL (0 mLs Intravenous Stopped 06/02/21 1146)  morphine 2 MG/ML injection 2 mg (2 mg Intravenous Given 06/02/21 1045)  ondansetron (ZOFRAN) injection 4 mg (4 mg Intravenous Given 06/02/21 1045)   Barium Sulfate 2.1 % SUSP 450 mL (450 mLs Oral Given 06/02/21 1332)    ED Course/ Medical Decision Making/ A&P                           Medical Decision Making Amount and/or Complexity of Data Reviewed Labs: ordered. Radiology: ordered.  Risk Prescription drug management.    CC: Dyspnea, abdominal pain, rectal pain, constipation  This patient presents to the Emergency Department for the above complaint. This involves an extensive number of treatment options and is a complaint that carries with it a high risk of complications and morbidity. Vital signs were reviewed. Serious etiologies considered.  Record review:  Previous records obtained and reviewed   Additional history obtained from daughter and spouse  Medical and surgical history as noted above.   Work up as above, notable for:  Labs & imaging results that were available during my care of the patient were reviewed by me and considered in my medical decision making.   I ordered imaging studies which included chest x-ray, CT abdomen pelvis without IV contrast and I independently visualized and interpreted imaging which showed chest x-ray was stable, CT abdomen with biliary sludge, renal lesions bilateral, no hydronephrosis.  Diverticulosis.  Aortic atherosclerosis  Cardiac monitoring reviewed and interpreted personally which shows normal sinus rhythm  Social determinants of health include - N/a  Personally discussed patient care with consultant; Nephro on call.   Patient's troponin is mildly elevated, consistent with CKD.  Downtrending.  No chest pain.  He is BNP is approximately 3500.  He is scheduled to have dialysis completed tomorrow.  He is breathing at his typical level, he is on 2 L nasal cannula.  Not currently feeling dyspneic.  Chest x-ray without evidence of fluid overload, no significant lower extremity edema.  Given elevation to BNP will discuss with nephrology.  Management: Patient given oral barium to  help encourage a bowel movement.  He did not want an enema.  Reassessment:  Pt is feeling better at this time, he has not had a bowel movement but does not want to stay in the ED longer to see if he is able to produce some stool. Discussed supportive care at home regarding constipation and hemorrhoid. D/w nephrology regarding presentation and elevated BNP, given lack of respiratory complaints and lack of pulm edema/le edema/respiratory distress favor patient stable for follow up for HD tomorrow.   Advised pt to f/u with gen surg regarding hemorrhoid if still symptomatic after supportive care discussed for home use.  The patient improved significantly and was discharged in stable condition. Detailed discussions were had with the patient regarding current findings, and need for close f/u with PCP or on call doctor. The patient has been instructed to return immediately if the symptoms worsen in any way for re-evaluation. Patient verbalized understanding and is in agreement with current care plan. All questions answered prior to discharge.         This chart was dictated using voice recognition software.  Despite best efforts to proofread,  errors can occur which can change the documentation meaning.         Final Clinical Impression(s) / ED Diagnoses Final diagnoses:  Constipation, unspecified constipation type  ESRD on hemodialysis [facility])    Rx / DC Orders ED Discharge Orders          Ordered    polyethylene glycol powder (GLYCOLAX/MIRALAX) 17 GM/SCOOP powder   Once        06/02/21 1607    lidocaine-hydrocortisone (ANAMANTLE) 3-1 % KIT  2 times daily        06/02/21 1607              Jeanell Sparrow, DO 06/02/21 2025

## 2021-06-02 NOTE — ED Provider Notes (Signed)
[name]    CSN: [id number] Arrival date & time: 06/02/21  0800      History   Chief Complaint Chief Complaint  Patient presents with   Hemorrhoids    HPI Ruben [name] is a 76 y.o. male.   Patient presents with concerns for hemorrhoids, constipation, abdominal pain that has been present for over 1 week.  Patient reports that his last normal bowel movement is unknown but he knows it has been over 1 week.  He was recently hospitalized for chest pain where he was found to have an NSTEMI on 04/29/2021.  Patient reports that he has had several procedures done since then and his last bowel movement was while he was in the hospital.  He was given Senokot in the hospital with no improvement.  He has not taken any medications since being released from the hospital to help alleviate constipation.  He also endorses that he has a hemorrhoid that is causing some significant pain for him.  He has also had some generalized abdominal pain that is rated 2/10 on pain scale that is a constant pain.  He is requesting pain medication for hemorrhoids and he reports that he has been taking Tylenol for pain.  He is still having flatulence.  Denies any nausea or vomiting but reports that he did have some nausea at the beginning of symptoms when he was in the hospital.  Denies any blood in the stool but reports that he does have blood when he wipes.    Past Medical History:  Diagnosis Date   Arthritis    Chronic kidney disease, stage 3b ([place])    ETOH abuse    History of stress test 09/02/2008   showed inferolateral scar without ischemia   Hx of echocardiogram 09/02/2008   was essentially normal   Hyperlipidemia    Hypertension    Myocardial infarction [facility])    Tobacco abuse     Patient Active Problem List   Diagnosis Date Noted   Acute systolic heart failure (HCC)    Atrial fibrillation with RVR ([place])    Protein-calorie malnutrition, severe 05/24/2021   AKI (acute kidney  injury) ([place])    Encounter for central line placement    Cardiogenic shock ([place])    Acute myocardial infarction ([place]) 04/29/2021   Stage 3 chronic kidney disease ([place]) 07/12/2018   Prediabetes 07/12/2018   Right upper quadrant abdominal pain 08/10/2017   Right knee pain 02/15/2016   Right patella fracture 02/12/2016   HTN (hypertension) 02/21/2013   Tobacco abuse 02/21/2013   HLD (hyperlipidemia) 02/21/2013    Past Surgical History:  Procedure Laterality Date   AV FISTULA PLACEMENT Left 05/27/2021   Procedure: LEFT ARTERIOVENOUS (AV) GRAFT CREATION;  Surgeon: Broadus John, MD;  Location: [place] [facility];  Service: Vascular;  Laterality: Left;  PERIPHERAL NERVE BLOCK   BACK SURGERY     Dr Luiz Ochoa involving L3-L4 discectomy.   CARDIOVERSION N/A 05/13/2021   Procedure: CARDIOVERSION;  Surgeon: Larey Dresser, MD;  Location: [facility] ENDOSCOPY;  Service: Cardiovascular;  Laterality: N/A;   HERNIA REPAIR     IR FLUORO GUIDE CV LINE RIGHT  05/21/2021   IR US GUIDE VASC ACCESS RIGHT  05/21/2021   RIGHT/LEFT HEART CATH AND CORONARY ANGIOGRAPHY N/A 05/09/2021   Procedure: RIGHT/LEFT HEART CATH AND CORONARY ANGIOGRAPHY;  Surgeon: Larey Dresser, MD;  Location: [place] CV LAB;  Service: Cardiovascular;  Laterality: N/A;   SPINE SURGERY     TEE WITHOUT CARDIOVERSION  N/A 05/07/2021   Procedure: TRANSESOPHAGEAL ECHOCARDIOGRAM (TEE);  Surgeon: Larey Dresser, MD;  Location: [facility] ENDOSCOPY;  Service: Cardiovascular;  Laterality: N/A;   TEE WITHOUT CARDIOVERSION N/A 05/13/2021   Procedure: TRANSESOPHAGEAL ECHOCARDIOGRAM (TEE);  Surgeon: Larey Dresser, MD;  Location: [facility] ENDOSCOPY;  Service: Cardiovascular;  Laterality: N/A;       Home Medications    Prior to Admission medications   Medication Sig Start Date End Date Taking? Authorizing Provider  acetaminophen (TYLENOL) 650 MG CR tablet Take 650 mg by mouth every 8 (eight) hours as needed for pain.   Yes [provider]  amiodarone (PACERONE) 200  MG tablet Take 1 tablet (200 mg total) by mouth daily. 05/28/21  Yes Clegg, Amy D, NP  apixaban (ELIQUIS) 5 MG TABS tablet Take 1 tablet (5 mg total) by mouth 2 (two) times daily. 05/28/21  Yes Clegg, Amy D, NP  atorvastatin (LIPITOR) 80 MG tablet Take 1 tablet (80 mg total) by mouth daily. 05/28/21  Yes Clegg, Amy D, NP  cholecalciferol (VITAMIN D3) 25 MCG (1000 UNIT) tablet Take 1,000 Units by mouth daily.   Yes [provider]  diclofenac Sodium (VOLTAREN) 1 % GEL Apply 2 g topically 4 (four) times daily. Patient taking differently: Apply 2 g topically daily as needed (pain). 10/24/19  Yes [name], FNP  Magnesium 200 MG TABS Take 1 tablet (200 mg total) by mouth daily. With evening meal Patient taking differently: Take 200 mg by mouth daily. 10/22/20  Yes [name], FNP  midodrine (PROAMATINE) 10 MG tablet Take 1 tablet (10 mg total) by mouth 3 (three) times daily with meals. 05/28/21  Yes Clegg, Amy D, NP  multivitamin (RENA-VIT) TABS tablet Take 1 tablet by mouth at bedtime. 05/28/21  Yes Clegg, Amy D, NP  nitroGLYCERIN (NITROSTAT) 0.4 MG SL tablet Place 1 tablet (0.4 mg total) under the tongue every 5 (five) minutes as needed for chest pain. 05/28/21 05/28/22 Yes Clegg, Amy D, NP  albuterol (PROVENTIL) (2.5 MG/3ML) 0.083% nebulizer solution Take 3 mLs (2.5 mg total) by nebulization every 4 (four) hours as needed for wheezing or shortness of breath. Patient not taking: Reported on 04/29/2021 04/03/21 04/03/22  [name], FNP  phenylephrine (,USE FOR PREPARATION-H,) 0.25 % suppository Place 1 suppository rectally 2 (two) times daily. 05/30/21   [name], FNP  polyethylene glycol powder (GLYCOLAX/MIRALAX) powder Take 17 g by mouth 2 (two) times daily as needed. Patient not taking: Reported on 04/29/2021 03/04/16   McVey, Gelene Mink, PA-C    Family History Family History  Problem Relation Age of Onset   Heart attack Brother    Diabetes Brother    Heart disease  Brother    Cancer Father    Diabetes Brother    Heart disease Brother     Social History Social History   Tobacco Use   Smoking status: Some Days    Packs/day: 1.50    Years: 20.00    Pack years: 30.00    Types: Cigarettes   Smokeless tobacco: Never   Tobacco comments:    down to 2 packs per week; 3/21 - down to 1 Pack per week  Substance Use Topics   Alcohol use: No    Alcohol/week: 0.0 standard drinks   Drug use: No     Allergies   Other   Review of Systems Review of Systems Per HPI  Physical Exam Triage Vital Signs ED Triage Vitals [06/02/21 0820]  Enc Vitals Group  BP (!) 94/58     Pulse Rate 79     Resp 18     Temp 98 F (36.7 C)     Temp Source Oral     SpO2 93 %     Weight 50 lb 14.5 oz (23.1 kg)     Height 5\' 11"  (1.803 m)     Head Circumference      Peak Flow      Pain Score 10     Pain Loc      Pain Edu?      Excl. in [place]?    No data found.  Updated Vital Signs BP (!) 94/58 (BP Location: Right Arm)    Pulse 79    Temp 98 F (36.7 C) (Oral)    Resp 18    Ht 5\' 11"  (1.803 m)    Wt 50 lb 14.5 oz (23.1 kg)    SpO2 93%    BMI 7.10 kg/m   Visual Acuity Right Eye Distance:   Left Eye Distance:   Bilateral Distance:    Right Eye Near:   Left Eye Near:    Bilateral Near:     Physical Exam Constitutional:      General: He is not in acute distress.    Appearance: Normal appearance. He is not toxic-appearing or diaphoretic.  HENT:     Head: Normocephalic and atraumatic.  Eyes:     Extraocular Movements: Extraocular movements intact.     Conjunctiva/sclera: Conjunctivae normal.  Cardiovascular:     Rate and Rhythm: Normal rate and regular rhythm.     Pulses: Normal pulses.     Heart sounds: Normal heart sounds.  Pulmonary:     Effort: Pulmonary effort is normal. No respiratory distress.     Breath sounds: Normal breath sounds.  Abdominal:     General: Bowel sounds are normal. There is no distension.     Palpations: Abdomen is soft.      Tenderness: There is no abdominal tenderness.  Neurological:     General: No focal deficit present.     Mental Status: He is alert and oriented to person, place, and time. Mental status is at baseline.  Psychiatric:        Mood and Affect: Mood normal.        Behavior: Behavior normal.        Thought Content: Thought content normal.        Judgment: Judgment normal.     UC Treatments / Results  Labs (all labs ordered are listed, but only abnormal results are displayed) Labs Reviewed - No data to display  EKG   Radiology No results found.  Procedures Procedures (including critical care time)  Medications Ordered in UC Medications - No data to display  Initial Impression / Assessment and Plan / UC Course  I have reviewed the triage vital signs and the nursing notes.  Pertinent labs & imaging results that were available during my care of the patient were reviewed by me and considered in my medical decision making (see chart for details).     It appears the patient may be constipated and there is low suspicion for a bowel blockage given the patient is passing flatulence, but I do think this needs to be ruled out.  I do not have x-ray in urgent care today.  Patient's blood pressure is also lower side of normal, and oxygen is 93%.  Due to all of these factors, patient's comorbidities, and duration since  patient last had a bowel movement, patient was advised to go to the hospital for more extensive evaluation and management.  Patient was agreeable with plan.  Vital signs stable at discharge.  Agree with patient self transport to the hospital. Final Clinical Impressions(s) / UC Diagnoses   Final diagnoses:  Generalized abdominal pain  Constipation, unspecified constipation type     Discharge Instructions      Please go to the emergency department as soon as you leave urgent care for further evaluation and management.    ED Prescriptions   None    PDMP not reviewed  this encounter.   Teodora Medici, [place] 06/02/21 [phone]

## 2021-06-02 NOTE — Discharge Instructions (Addendum)
It was a pleasure caring for you today in the emergency department. ° °Please return to the emergency department for any worsening or worrisome symptoms. ° ° °

## 2021-06-02 NOTE — ED Triage Notes (Signed)
Patient c/o hemorrhoids and constipation x 1 week.  Patient was just released from the hospital last week for cardiac arrest.  Patient c/o significant pain, had some hard "balls" this morning with BM.

## 2021-06-02 NOTE — Discharge Instructions (Signed)
Please go to the emergency department as soon as you leave urgent care for further evaluation and management. ?

## 2021-06-02 NOTE — ED Triage Notes (Signed)
Pt presents with constipation. No bm for a week, lots of rectal pain/pressure. Pt was hospitalized with heart attack in December and just dc recently due to kidney damage. Is new dialysis.

## 2021-06-03 ENCOUNTER — Telehealth (HOSPITAL_COMMUNITY): Payer: Self-pay

## 2021-06-03 ENCOUNTER — Encounter (HOSPITAL_COMMUNITY): Payer: Medicare Other

## 2021-06-03 DIAGNOSIS — N2581 Secondary hyperparathyroidism of renal origin: Secondary | ICD-10-CM | POA: Diagnosis not present

## 2021-06-03 DIAGNOSIS — N179 Acute kidney failure, unspecified: Secondary | ICD-10-CM | POA: Diagnosis not present

## 2021-06-03 DIAGNOSIS — Z992 Dependence on renal dialysis: Secondary | ICD-10-CM | POA: Diagnosis not present

## 2021-06-03 DIAGNOSIS — D689 Coagulation defect, unspecified: Secondary | ICD-10-CM | POA: Diagnosis not present

## 2021-06-03 NOTE — Telephone Encounter (Signed)
Pt insurance is active and benefits verified through [employer] Medicare Co-pay 0, DED 0/0 met, out of pocket $3,600/0 met, co-insurance 0%. no pre-authorization required. Passport, 06/03/2021@10:42am, REF# [id number] °  °Will contact patient to see if he is interested in the Cardiac Rehab Program. If interested, patient will need to complete follow up appt. Once completed, patient will be contacted for scheduling upon review by the RN Navigator. °

## 2021-06-04 ENCOUNTER — Encounter: Payer: Self-pay | Admitting: Nurse Practitioner

## 2021-06-04 ENCOUNTER — Telehealth (INDEPENDENT_AMBULATORY_CARE_PROVIDER_SITE_OTHER): Payer: Medicare Other | Admitting: Nurse Practitioner

## 2021-06-04 VITALS — BP 106/62 | HR 95 | Ht 71.0 in

## 2021-06-04 DIAGNOSIS — K649 Unspecified hemorrhoids: Secondary | ICD-10-CM

## 2021-06-04 DIAGNOSIS — I5022 Chronic systolic (congestive) heart failure: Secondary | ICD-10-CM | POA: Diagnosis not present

## 2021-06-04 DIAGNOSIS — R7303 Prediabetes: Secondary | ICD-10-CM | POA: Diagnosis not present

## 2021-06-04 DIAGNOSIS — K6289 Other specified diseases of anus and rectum: Secondary | ICD-10-CM | POA: Diagnosis not present

## 2021-06-04 DIAGNOSIS — I4891 Unspecified atrial fibrillation: Secondary | ICD-10-CM | POA: Diagnosis not present

## 2021-06-04 DIAGNOSIS — Z992 Dependence on renal dialysis: Secondary | ICD-10-CM

## 2021-06-04 DIAGNOSIS — N186 End stage renal disease: Secondary | ICD-10-CM

## 2021-06-04 DIAGNOSIS — I252 Old myocardial infarction: Secondary | ICD-10-CM

## 2021-06-04 DIAGNOSIS — I5021 Acute systolic (congestive) heart failure: Secondary | ICD-10-CM

## 2021-06-04 DIAGNOSIS — R110 Nausea: Secondary | ICD-10-CM

## 2021-06-04 MED ORDER — ONDANSETRON HCL 4 MG PO TABS: 4.0000 mg | ORAL_TABLET | Freq: Every day | ORAL | 1 refills | Status: AC | PRN

## 2021-06-04 MED ORDER — TRAMADOL HCL 50 MG PO TABS: 50.0000 mg | ORAL_TABLET | Freq: Four times a day (QID) | ORAL | 0 refills | Status: DC | PRN

## 2021-06-04 NOTE — Progress Notes (Signed)
My   Virtual Visit via MyChart   This visit type was conducted due to national recommendations for restrictions regarding the COVID-19 Pandemic (e.g. social distancing) in an effort to limit this patient's exposure and mitigate transmission in our community.  Due to his co-morbid illnesses, this patient is at least at moderate risk for complications without adequate follow up.  This format is felt to be most appropriate for this patient at this time.  All issues noted in this document were discussed and addressed.  A limited physical exam was performed with this format.    This visit type was conducted due to national recommendations for restrictions regarding the COVID-19 Pandemic (e.g. social distancing) in an effort to limit this patient's exposure and mitigate transmission in our community.  Patients identity confirmed using two different identifiers.  This format is felt to be most appropriate for this patient at this time.  All issues noted in this document were discussed and addressed.  No physical exam was performed (except for noted visual exam findings with Video Visits).    Date:  06/04/2021   ID:  Herbie Baltimore Junior [place], DOB [birth year], MRN [id number]  Patient Location:  Home - spoke with Susa Day and Lenox Ahr  Provider location:   Office    Chief Complaint:  hospital admission f/u  History of Present Illness:    Orva Junior Wengert is a 76 y.o. male who presents via video conferencing for a telehealth visit today.    The patient does not have symptoms concerning for COVID-19 infection (fever, chills, cough, or new shortness of breath).   Pt presents for hospital f/u from admission on 04/29/21-05/28/2021 after suspectd late presentation MI. Per hospital note as follows:  He was hypoxic in Urgent care with O2sats 88-91% on RA was transferred to the ED and went into AF with RVR and placed on BIPAP, his troponin was up to >20,000, BNP 1659, AST 190. Was admitted to ICU,  hs ECHO with LVEF 35-40% with inferior hypokinesis, likely also had moderate to severe MR. He had AKI was eventually placed on CVVHD, now on hemodialysis. He was found to have an occluded RCA but not a good surgical candidate after consultation with Cardiovascular thoracic surgeon, he had been on milrinone and became hypertensive during cath and went into respiratory distress, Briefly required nitro gtt and started on milrinone + lasix gtt. Lasix gtt off on 01/07 and milrinone discontinued 01/08. He continued to be in AF and was transitioned to eliquis 21m twice a day. He was found to have Occluded RCA (culprit), diffuse disease in LAD and Lcx, he was not felt to be a surgical candidate   Since being home his Daughter (Leonia Reeves reports pt having pain in rectum area.  Poor appetite. & no recent BM.  He is now on dialysis MWF - this is new - so far doing well. Hoping to be temporary - left AV fistula - pain to the area is improved.   He has rectal hemorrhoids. He has tried Preparation H not effective. He did go to [facility] and recommended to have them cut out. Hurts to pass gas was given pain medication. He is staying nauseated he did vomit. He is unable to sit long periods and unable to lay down due to his blood pressure.   Sitz bath, preparation H, and lidocaine.      Past Medical History:  Diagnosis Date   Arthritis    [place]  ESRD (end stage renal disease) ([place])    ETOH abuse    History of stress test 09/02/2008   showed inferolateral scar without ischemia   Hx of echocardiogram 09/02/2008   was essentially normal   Hyperlipidemia    Hypertension    Myocardial infarction [facility])    Tobacco abuse    Past Surgical History:  Procedure Laterality Date   AV FISTULA PLACEMENT Left 05/27/2021   Procedure: LEFT ARTERIOVENOUS (AV) GRAFT CREATION;  Surgeon: Broadus John, MD;  Location: [place];  Service: Vascular;  Laterality: Left;  PERIPHERAL NERVE BLOCK    BACK SURGERY     Dr Luiz Ochoa involving L3-L4 discectomy.   CARDIOVERSION N/A 05/13/2021   Procedure: CARDIOVERSION;  Surgeon: Larey Dresser, MD;  Location: [facility] ENDOSCOPY;  Service: Cardiovascular;  Laterality: N/A;   HERNIA REPAIR     IR FLUORO GUIDE CV LINE RIGHT  05/21/2021   IR US GUIDE VASC ACCESS RIGHT  05/21/2021   RIGHT/LEFT HEART CATH AND CORONARY ANGIOGRAPHY N/A 05/09/2021   Procedure: RIGHT/LEFT HEART CATH AND CORONARY ANGIOGRAPHY;  Surgeon: Larey Dresser, MD;  Location: [place] CV LAB;  Service: Cardiovascular;  Laterality: N/A;   SPINE SURGERY     TEE WITHOUT CARDIOVERSION N/A 05/07/2021   Procedure: TRANSESOPHAGEAL ECHOCARDIOGRAM (TEE);  Surgeon: Larey Dresser, MD;  Location: [facility] ENDOSCOPY;  Service: Cardiovascular;  Laterality: N/A;   TEE WITHOUT CARDIOVERSION N/A 05/13/2021   Procedure: TRANSESOPHAGEAL ECHOCARDIOGRAM (TEE);  Surgeon: Larey Dresser, MD;  Location: [facility] ENDOSCOPY;  Service: Cardiovascular;  Laterality: N/A;     Current Meds  Medication Sig   acetaminophen (TYLENOL) 650 MG CR tablet Take 650 mg by mouth every 8 (eight) hours as needed for pain.   amiodarone (PACERONE) 200 MG tablet Take 1 tablet (200 mg total) by mouth daily.   apixaban (ELIQUIS) 5 MG TABS tablet Take 1 tablet (5 mg total) by mouth 2 (two) times daily.   atorvastatin (LIPITOR) 80 MG tablet Take 1 tablet (80 mg total) by mouth daily.   cholecalciferol (VITAMIN D3) 25 MCG (1000 UNIT) tablet Take 1,000 Units by mouth daily.   diclofenac Sodium (VOLTAREN) 1 % GEL Apply 2 g topically 4 (four) times daily. (Patient taking differently: Apply 2 g topically daily as needed (pain).)   lidocaine-hydrocortisone (ANAMANTLE) 3-1 % KIT Place 1 application rectally 2 (two) times daily.   Magnesium 200 MG TABS Take 1 tablet (200 mg total) by mouth daily. With evening meal (Patient taking differently: Take 200 mg by mouth daily.)   midodrine (PROAMATINE) 10 MG tablet Take 1 tablet (10 mg total) by mouth 3 (three)  times daily with meals.   multivitamin (RENA-VIT) TABS tablet Take 1 tablet by mouth at bedtime.   nitroGLYCERIN (NITROSTAT) 0.4 MG SL tablet Place 1 tablet (0.4 mg total) under the tongue every 5 (five) minutes as needed for chest pain.   polyethylene glycol powder (GLYCOLAX/MIRALAX) powder Take 17 g by mouth 2 (two) times daily as needed.     Allergies:   Other   Social History   Tobacco Use   Smoking status: Former    Packs/day: 1.50    Years: 20.00    Pack years: 30.00    Types: Cigarettes   Smokeless tobacco: Never   Tobacco comments:    down to 2 packs per week; 3/21 - down to 1 Pack per week  Substance Use Topics   Alcohol use: No    Alcohol/week: 0.0 standard drinks   Drug use:  No     Family Hx: The patient's family history includes Cancer in his father; Diabetes in his brother and brother; Heart attack in his brother; Heart disease in his brother and brother.  ROS:   Please see the history of present illness.    Review of Systems  Constitutional: Negative.   Respiratory: Negative.    Cardiovascular: Negative.   Gastrointestinal:  Positive for constipation.       Hemorrhoids  Genitourinary: Negative.   Skin: Negative.   Endo/Heme/Allergies: Negative.   Psychiatric/Behavioral: Negative.     All other systems reviewed and are negative.   Labs/Other Tests and Data Reviewed:    Recent Labs: 04/29/2021: TSH 0.934 05/19/2021: Magnesium 2.0 06/02/2021: ALT 5; B Natriuretic Peptide 3,498.9; BUN 36; Creatinine, Ser 7.68; Hemoglobin 10.8; Platelets 143; Potassium 4.2; Sodium 138   Recent Lipid Panel Lab Results  Component Value Date/Time   CHOL 160 04/30/2021 02:27 AM   CHOL 202 (H) 02/18/2021 11:48 AM   TRIG 41 04/30/2021 02:27 AM   HDL 59 04/30/2021 02:27 AM   HDL 55 02/18/2021 11:48 AM   CHOLHDL 2.7 04/30/2021 02:27 AM   LDLCALC 93 04/30/2021 02:27 AM   LDLCALC 125 (H) 02/18/2021 11:48 AM    Wt Readings from Last 3 Encounters:  06/02/21 50 lb 14.5 oz  (23.1 kg)  02/18/21 180 lb 12.8 oz (82 kg)  10/22/20 181 lb 3.2 oz (82.2 kg)     Exam:    Vital Signs:  BP 106/62    Pulse 95    Ht 5' 11"  (1.803 m)    BMI 7.10 kg/m     Physical Exam Constitutional:      General: He is not in acute distress.    Appearance: Normal appearance.     Comments: Appears uncomfortable. Appears thin  Pulmonary:     Effort: Pulmonary effort is normal. No respiratory distress.  Neurological:     General: No focal deficit present.     Mental Status: He is alert and oriented to person, place, and time. Mental status is at baseline.     Cranial Nerves: No cranial nerve deficit.  Psychiatric:        Mood and Affect: Mood and affect normal.        Behavior: Behavior normal.        Thought Content: Thought content normal.        Cognition and Memory: Memory normal.        Judgment: Judgment normal.    ASSESSMENT & PLAN:    1. Chronic systolic CHF (congestive heart failure) ([place]) Admitted 04/29/21 after suffering an MI remained hospitalized until 05/28/21. ECHO with LVEF 35-40%. EF 35-40%. He is currently at home be cared for by his daughter.  TCM Performed. A member of the clinical team spoke with the patient upon dischare. Discharge summary was reviewed in full detail during the visit. Meds reconciled and compared to discharge meds. Medication list is updated and reviewed with the patient.  Greater than 50% face to face time was spent in counseling an coordination of care.  All questions were answered to the satisfaction of the patient.    2. History of non-ST elevation myocardial infarction (NSTEMI) Recent admission where his troponin was >20,000. Was admitted to the ICU.   3. Nausea Likely related to constipation and pain. Advised if he worsens with vomiting to go to ER. He has not been able to eat as well - ondansetron (ZOFRAN) 4 MG tablet; Take 1 tablet (4  mg total) by mouth daily as needed for nausea or vomiting.  Dispense: 30 tablet; Refill: 1  4.  Hemorrhoids, unspecified hemorrhoid type Will refer to GI and general surtery - Ambulatory referral to General Surgery - Ambulatory referral to Gastroenterology - traMADol (ULTRAM) 50 MG tablet; Take 1 tablet (50 mg total) by mouth every 6 (six) hours as needed. (Patient taking differently: Take 50 mg by mouth every 6 (six) hours as needed for moderate pain.)  Dispense: 20 tablet; Refill: 0  5. ESRD on hemodialysis ([place]) Acute kidney injury and patient reports plan to be on dialysis temporarily.   6. Rectal pain and constipation Has had pain and constipation since hospitalization, has tried anusol. Encouraged to increase fiber intake and to take miralax daily.  7. Prediabetes Controlled while in the hospital.   8. Atrial fibrillation with RVR ([place]) Started on eliquis    COVID-19 Education: The signs and symptoms of COVID-19 were discussed with the patient and how to seek care for testing (follow up with PCP or arrange E-visit).  The importance of social distancing was discussed today.  Patient Risk:   After full review of this patients clinical status, I feel that they are at least moderate risk at this time.  Time:   Today, I have spent 21.15 minutes/ seconds with the patient with telehealth technology discussing above diagnoses.     Medication Adjustments/Labs and Tests Ordered: Current medicines are reviewed at length with the patient today.  Concerns regarding medicines are outlined above.   Tests Ordered: No orders of the defined types were placed in this encounter.   Medication Changes: No orders of the defined types were placed in this encounter.   Disposition:  Follow up in 3 month(s)  Signed, Debbora Dus, CMA

## 2021-06-05 DIAGNOSIS — Z992 Dependence on renal dialysis: Secondary | ICD-10-CM | POA: Diagnosis not present

## 2021-06-05 DIAGNOSIS — N179 Acute kidney failure, unspecified: Secondary | ICD-10-CM | POA: Diagnosis not present

## 2021-06-05 DIAGNOSIS — N2581 Secondary hyperparathyroidism of renal origin: Secondary | ICD-10-CM | POA: Diagnosis not present

## 2021-06-05 DIAGNOSIS — D689 Coagulation defect, unspecified: Secondary | ICD-10-CM | POA: Diagnosis not present

## 2021-06-06 ENCOUNTER — Telehealth (HOSPITAL_COMMUNITY): Payer: Self-pay

## 2021-06-06 ENCOUNTER — Other Ambulatory Visit (HOSPITAL_COMMUNITY): Payer: Self-pay

## 2021-06-06 NOTE — Telephone Encounter (Signed)
Transitions of Care Pharmacy  ° °Call attempted for a pharmacy transitions of care follow-up. HIPAA appropriate voicemail was left with call back information provided.  ° °Call attempt #1. Will follow-up in 2-3 days.  °  °

## 2021-06-07 ENCOUNTER — Encounter (HOSPITAL_BASED_OUTPATIENT_CLINIC_OR_DEPARTMENT_OTHER): Payer: Self-pay

## 2021-06-07 ENCOUNTER — Emergency Department (HOSPITAL_BASED_OUTPATIENT_CLINIC_OR_DEPARTMENT_OTHER): Payer: Medicare Other

## 2021-06-07 ENCOUNTER — Other Ambulatory Visit: Payer: Self-pay

## 2021-06-07 ENCOUNTER — Inpatient Hospital Stay (HOSPITAL_BASED_OUTPATIENT_CLINIC_OR_DEPARTMENT_OTHER)
Admission: EM | Admit: 2021-06-07 | Discharge: 2021-06-27 | DRG: 393 | Disposition: A | Discharge status: Skilled Nursing Facility | Payer: Medicare Other | Attending: Internal Medicine | Admitting: Internal Medicine

## 2021-06-07 DIAGNOSIS — E871 Hypo-osmolality and hyponatremia: Secondary | ICD-10-CM | POA: Diagnosis not present

## 2021-06-07 DIAGNOSIS — K625 Hemorrhage of anus and rectum: Secondary | ICD-10-CM | POA: Diagnosis not present

## 2021-06-07 DIAGNOSIS — E43 Unspecified severe protein-calorie malnutrition: Secondary | ICD-10-CM | POA: Diagnosis present

## 2021-06-07 DIAGNOSIS — K5900 Constipation, unspecified: Secondary | ICD-10-CM

## 2021-06-07 DIAGNOSIS — J9611 Chronic respiratory failure with hypoxia: Secondary | ICD-10-CM | POA: Diagnosis present

## 2021-06-07 DIAGNOSIS — D649 Anemia, unspecified: Secondary | ICD-10-CM | POA: Diagnosis present

## 2021-06-07 DIAGNOSIS — Z7401 Bed confinement status: Secondary | ICD-10-CM | POA: Diagnosis not present

## 2021-06-07 DIAGNOSIS — Z79899 Other long term (current) drug therapy: Secondary | ICD-10-CM | POA: Diagnosis not present

## 2021-06-07 DIAGNOSIS — K7689 Other specified diseases of liver: Secondary | ICD-10-CM | POA: Diagnosis not present

## 2021-06-07 DIAGNOSIS — K626 Ulcer of anus and rectum: Principal | ICD-10-CM | POA: Diagnosis present

## 2021-06-07 DIAGNOSIS — I5022 Chronic systolic (congestive) heart failure: Secondary | ICD-10-CM | POA: Diagnosis not present

## 2021-06-07 DIAGNOSIS — K769 Liver disease, unspecified: Secondary | ICD-10-CM | POA: Diagnosis not present

## 2021-06-07 DIAGNOSIS — D638 Anemia in other chronic diseases classified elsewhere: Secondary | ICD-10-CM | POA: Diagnosis not present

## 2021-06-07 DIAGNOSIS — E7800 Pure hypercholesterolemia, unspecified: Secondary | ICD-10-CM | POA: Diagnosis not present

## 2021-06-07 DIAGNOSIS — K810 Acute cholecystitis: Secondary | ICD-10-CM | POA: Diagnosis not present

## 2021-06-07 DIAGNOSIS — R1310 Dysphagia, unspecified: Secondary | ICD-10-CM | POA: Diagnosis not present

## 2021-06-07 DIAGNOSIS — K5120 Ulcerative (chronic) proctitis without complications: Secondary | ICD-10-CM | POA: Diagnosis not present

## 2021-06-07 DIAGNOSIS — R7989 Other specified abnormal findings of blood chemistry: Secondary | ICD-10-CM | POA: Diagnosis not present

## 2021-06-07 DIAGNOSIS — N250 Renal osteodystrophy: Secondary | ICD-10-CM | POA: Diagnosis not present

## 2021-06-07 DIAGNOSIS — R5381 Other malaise: Secondary | ICD-10-CM | POA: Diagnosis present

## 2021-06-07 DIAGNOSIS — J90 Pleural effusion, not elsewhere classified: Secondary | ICD-10-CM | POA: Diagnosis not present

## 2021-06-07 DIAGNOSIS — K633 Ulcer of intestine: Secondary | ICD-10-CM | POA: Diagnosis not present

## 2021-06-07 DIAGNOSIS — I2510 Atherosclerotic heart disease of native coronary artery without angina pectoris: Secondary | ICD-10-CM | POA: Diagnosis present

## 2021-06-07 DIAGNOSIS — N186 End stage renal disease: Secondary | ICD-10-CM | POA: Diagnosis not present

## 2021-06-07 DIAGNOSIS — Z681 Body mass index (BMI) 19 or less, adult: Secondary | ICD-10-CM | POA: Diagnosis not present

## 2021-06-07 DIAGNOSIS — K819 Cholecystitis, unspecified: Secondary | ICD-10-CM | POA: Diagnosis not present

## 2021-06-07 DIAGNOSIS — I340 Nonrheumatic mitral (valve) insufficiency: Secondary | ICD-10-CM | POA: Diagnosis not present

## 2021-06-07 DIAGNOSIS — J811 Chronic pulmonary edema: Secondary | ICD-10-CM | POA: Diagnosis not present

## 2021-06-07 DIAGNOSIS — D689 Coagulation defect, unspecified: Secondary | ICD-10-CM | POA: Diagnosis not present

## 2021-06-07 DIAGNOSIS — R1312 Dysphagia, oropharyngeal phase: Secondary | ICD-10-CM | POA: Diagnosis not present

## 2021-06-07 DIAGNOSIS — R6889 Other general symptoms and signs: Secondary | ICD-10-CM | POA: Diagnosis not present

## 2021-06-07 DIAGNOSIS — F32A Depression, unspecified: Secondary | ICD-10-CM | POA: Diagnosis not present

## 2021-06-07 DIAGNOSIS — I132 Hypertensive heart and chronic kidney disease with heart failure and with stage 5 chronic kidney disease, or end stage renal disease: Secondary | ICD-10-CM | POA: Diagnosis not present

## 2021-06-07 DIAGNOSIS — N179 Acute kidney failure, unspecified: Secondary | ICD-10-CM | POA: Diagnosis not present

## 2021-06-07 DIAGNOSIS — Z1211 Encounter for screening for malignant neoplasm of colon: Secondary | ICD-10-CM | POA: Insufficient documentation

## 2021-06-07 DIAGNOSIS — I252 Old myocardial infarction: Secondary | ICD-10-CM | POA: Diagnosis not present

## 2021-06-07 DIAGNOSIS — G4700 Insomnia, unspecified: Secondary | ICD-10-CM | POA: Diagnosis present

## 2021-06-07 DIAGNOSIS — K824 Cholesterolosis of gallbladder: Secondary | ICD-10-CM | POA: Diagnosis not present

## 2021-06-07 DIAGNOSIS — F064 Anxiety disorder due to known physiological condition: Secondary | ICD-10-CM | POA: Diagnosis not present

## 2021-06-07 DIAGNOSIS — F17210 Nicotine dependence, cigarettes, uncomplicated: Secondary | ICD-10-CM | POA: Diagnosis present

## 2021-06-07 DIAGNOSIS — K5641 Fecal impaction: Secondary | ICD-10-CM | POA: Diagnosis not present

## 2021-06-07 DIAGNOSIS — K760 Fatty (change of) liver, not elsewhere classified: Secondary | ICD-10-CM | POA: Diagnosis present

## 2021-06-07 DIAGNOSIS — F1010 Alcohol abuse, uncomplicated: Secondary | ICD-10-CM | POA: Diagnosis not present

## 2021-06-07 DIAGNOSIS — Z743 Need for continuous supervision: Secondary | ICD-10-CM | POA: Diagnosis not present

## 2021-06-07 DIAGNOSIS — R634 Abnormal weight loss: Secondary | ICD-10-CM | POA: Diagnosis not present

## 2021-06-07 DIAGNOSIS — R748 Abnormal levels of other serum enzymes: Secondary | ICD-10-CM | POA: Diagnosis present

## 2021-06-07 DIAGNOSIS — R2681 Unsteadiness on feet: Secondary | ICD-10-CM | POA: Diagnosis not present

## 2021-06-07 DIAGNOSIS — Z7901 Long term (current) use of anticoagulants: Secondary | ICD-10-CM

## 2021-06-07 DIAGNOSIS — R1110 Vomiting, unspecified: Secondary | ICD-10-CM | POA: Diagnosis not present

## 2021-06-07 DIAGNOSIS — R112 Nausea with vomiting, unspecified: Secondary | ICD-10-CM | POA: Diagnosis not present

## 2021-06-07 DIAGNOSIS — I9589 Other hypotension: Secondary | ICD-10-CM | POA: Diagnosis not present

## 2021-06-07 DIAGNOSIS — Z20822 Contact with and (suspected) exposure to covid-19: Secondary | ICD-10-CM | POA: Diagnosis present

## 2021-06-07 DIAGNOSIS — Z860100 Personal history of colon polyps, unspecified: Secondary | ICD-10-CM | POA: Insufficient documentation

## 2021-06-07 DIAGNOSIS — Z992 Dependence on renal dialysis: Secondary | ICD-10-CM | POA: Diagnosis not present

## 2021-06-07 DIAGNOSIS — I959 Hypotension, unspecified: Secondary | ICD-10-CM | POA: Diagnosis not present

## 2021-06-07 DIAGNOSIS — Z9981 Dependence on supplemental oxygen: Secondary | ICD-10-CM

## 2021-06-07 DIAGNOSIS — M898X9 Other specified disorders of bone, unspecified site: Secondary | ICD-10-CM | POA: Diagnosis present

## 2021-06-07 DIAGNOSIS — K6289 Other specified diseases of anus and rectum: Secondary | ICD-10-CM | POA: Diagnosis not present

## 2021-06-07 DIAGNOSIS — K602 Anal fissure, unspecified: Secondary | ICD-10-CM | POA: Diagnosis not present

## 2021-06-07 DIAGNOSIS — R933 Abnormal findings on diagnostic imaging of other parts of digestive tract: Secondary | ICD-10-CM | POA: Diagnosis not present

## 2021-06-07 DIAGNOSIS — F419 Anxiety disorder, unspecified: Secondary | ICD-10-CM | POA: Diagnosis present

## 2021-06-07 DIAGNOSIS — K7460 Unspecified cirrhosis of liver: Secondary | ICD-10-CM | POA: Diagnosis present

## 2021-06-07 DIAGNOSIS — R9431 Abnormal electrocardiogram [ECG] [EKG]: Secondary | ICD-10-CM | POA: Diagnosis not present

## 2021-06-07 DIAGNOSIS — D631 Anemia in chronic kidney disease: Secondary | ICD-10-CM | POA: Diagnosis not present

## 2021-06-07 DIAGNOSIS — R7401 Elevation of levels of liver transaminase levels: Secondary | ICD-10-CM | POA: Diagnosis not present

## 2021-06-07 DIAGNOSIS — E785 Hyperlipidemia, unspecified: Secondary | ICD-10-CM | POA: Diagnosis not present

## 2021-06-07 DIAGNOSIS — K5904 Chronic idiopathic constipation: Secondary | ICD-10-CM | POA: Diagnosis not present

## 2021-06-07 DIAGNOSIS — I480 Paroxysmal atrial fibrillation: Secondary | ICD-10-CM | POA: Diagnosis not present

## 2021-06-07 DIAGNOSIS — I120 Hypertensive chronic kidney disease with stage 5 chronic kidney disease or end stage renal disease: Secondary | ICD-10-CM | POA: Diagnosis not present

## 2021-06-07 DIAGNOSIS — M6281 Muscle weakness (generalized): Secondary | ICD-10-CM | POA: Diagnosis not present

## 2021-06-07 DIAGNOSIS — R0600 Dyspnea, unspecified: Secondary | ICD-10-CM | POA: Diagnosis not present

## 2021-06-07 DIAGNOSIS — N2581 Secondary hyperparathyroidism of renal origin: Secondary | ICD-10-CM | POA: Diagnosis present

## 2021-06-07 DIAGNOSIS — Z833 Family history of diabetes mellitus: Secondary | ICD-10-CM

## 2021-06-07 DIAGNOSIS — Z8249 Family history of ischemic heart disease and other diseases of the circulatory system: Secondary | ICD-10-CM

## 2021-06-07 DIAGNOSIS — R062 Wheezing: Secondary | ICD-10-CM

## 2021-06-07 DIAGNOSIS — R1032 Left lower quadrant pain: Secondary | ICD-10-CM | POA: Diagnosis not present

## 2021-06-07 DIAGNOSIS — I700 Atherosclerosis of aorta: Secondary | ICD-10-CM | POA: Diagnosis not present

## 2021-06-07 DIAGNOSIS — R109 Unspecified abdominal pain: Secondary | ICD-10-CM | POA: Diagnosis not present

## 2021-06-07 DIAGNOSIS — K8020 Calculus of gallbladder without cholecystitis without obstruction: Secondary | ICD-10-CM | POA: Diagnosis not present

## 2021-06-07 DIAGNOSIS — R0602 Shortness of breath: Secondary | ICD-10-CM | POA: Diagnosis not present

## 2021-06-07 DIAGNOSIS — I5021 Acute systolic (congestive) heart failure: Secondary | ICD-10-CM | POA: Diagnosis not present

## 2021-06-07 DIAGNOSIS — N281 Cyst of kidney, acquired: Secondary | ICD-10-CM | POA: Diagnosis not present

## 2021-06-07 DIAGNOSIS — K5730 Diverticulosis of large intestine without perforation or abscess without bleeding: Secondary | ICD-10-CM | POA: Diagnosis not present

## 2021-06-07 DIAGNOSIS — K828 Other specified diseases of gallbladder: Secondary | ICD-10-CM | POA: Diagnosis not present

## 2021-06-07 DIAGNOSIS — E8770 Fluid overload, unspecified: Secondary | ICD-10-CM | POA: Diagnosis not present

## 2021-06-07 LAB — COMPREHENSIVE METABOLIC PANEL
ALT: 32 U/L (ref 0–44)
AST: 55 U/L — ABNORMAL HIGH (ref 15–41)
Albumin: 3.3 g/dL — ABNORMAL LOW (ref 3.5–5.0)
Alkaline Phosphatase: 90 U/L (ref 38–126)
Anion gap: 13 (ref 5–15)
BUN: 10 mg/dL (ref 8–23)
CO2: 32 mmol/L (ref 22–32)
Calcium: 8.3 mg/dL — ABNORMAL LOW (ref 8.9–10.3)
Chloride: 95 mmol/L — ABNORMAL LOW (ref 98–111)
Creatinine, Ser: 2.8 mg/dL — ABNORMAL HIGH (ref 0.61–1.24)
GFR, Estimated: 23 mL/min — ABNORMAL LOW (ref >60)
Glucose, Bld: 121 mg/dL — ABNORMAL HIGH (ref 70–99)
Potassium: 3.6 mmol/L (ref 3.5–5.1)
Sodium: 140 mmol/L (ref 135–145)
Total Bilirubin: 0.9 mg/dL (ref 0.3–1.2)
Total Protein: 7.1 g/dL (ref 6.5–8.1)

## 2021-06-07 LAB — CBC WITH DIFFERENTIAL/PLATELET
Abs Immature Granulocytes: 0.02 10*3/uL (ref 0.00–0.07)
Basophils Absolute: 0 10*3/uL (ref 0.0–0.1)
Basophils Relative: 0 %
Eosinophils Absolute: 0 10*3/uL (ref 0.0–0.5)
Eosinophils Relative: 0 %
HCT: 28.5 % — ABNORMAL LOW (ref 39.0–52.0)
Hemoglobin: 9.4 g/dL — ABNORMAL LOW (ref 13.0–17.0)
Immature Granulocytes: 0 %
Lymphocytes Relative: 15 %
Lymphs Abs: 1 10*3/uL (ref 0.7–4.0)
MCH: 31.4 pg (ref 26.0–34.0)
MCHC: 33 g/dL (ref 30.0–36.0)
MCV: 95.3 fL (ref 80.0–100.0)
Monocytes Absolute: 0.6 10*3/uL (ref 0.1–1.0)
Monocytes Relative: 8 %
Neutro Abs: 5.4 10*3/uL (ref 1.7–7.7)
Neutrophils Relative %: 77 %
Platelets: 177 10*3/uL (ref 150–400)
RBC: 2.99 MIL/uL — ABNORMAL LOW (ref 4.22–5.81)
RDW: 14.7 % (ref 11.5–15.5)
WBC: 7 10*3/uL (ref 4.0–10.5)
nRBC: 0 % (ref 0.0–0.2)

## 2021-06-07 LAB — TROPONIN I (HIGH SENSITIVITY): Troponin I (High Sensitivity): 63 ng/L — ABNORMAL HIGH (ref <18)

## 2021-06-07 LAB — MAGNESIUM: Magnesium: 1.9 mg/dL (ref 1.7–2.4)

## 2021-06-07 LAB — RESP PANEL BY RT-PCR (FLU A&B, COVID) ARPGX2
Influenza A by PCR: NEGATIVE
Influenza B by PCR: NEGATIVE
SARS Coronavirus 2 by RT PCR: NEGATIVE

## 2021-06-07 LAB — OCCULT BLOOD X 1 CARD TO LAB, STOOL: Fecal Occult Bld: NEGATIVE

## 2021-06-07 LAB — LIPASE, BLOOD: Lipase: 39 U/L (ref 11–51)

## 2021-06-07 MED ORDER — FENTANYL CITRATE PF 50 MCG/ML IJ SOSY
50.0000 ug | PREFILLED_SYRINGE | Freq: Once | INTRAMUSCULAR | Status: AC
Start: 2021-06-07 — End: 2021-06-07
  Administered 2021-06-07: 50 ug via INTRAVENOUS
  Filled 2021-06-07: qty 1

## 2021-06-07 MED ORDER — SODIUM CHLORIDE 0.9 % IV SOLN
2.0000 g | Freq: Once | INTRAVENOUS | Status: AC
  Administered 2021-06-07: 2 g via INTRAVENOUS
  Filled 2021-06-07: qty 20

## 2021-06-07 MED ORDER — LACTATED RINGERS IV BOLUS
1000.0000 mL | Freq: Once | INTRAVENOUS | Status: AC
  Administered 2021-06-07: 1000 mL via INTRAVENOUS

## 2021-06-07 NOTE — ED Notes (Signed)
Patient transported to CT 

## 2021-06-07 NOTE — ED Triage Notes (Signed)
Pt arrives with daughter with reports of vomiting and rectal pain daughter reports this haas been going on since he came home from the hospital Jan. 24th. Pt is dialysis patient, went to dialysis today and did have his full treatment. Daughter reports dialysis wanted him to have a CT abdomen and swallow test today.

## 2021-06-07 NOTE — ED Notes (Signed)
Pt has returned from CT.  

## 2021-06-07 NOTE — Progress Notes (Signed)
Note: Portions of this report may have been transcribed using voice recognition software. Every effort was made to ensure accuracy; however, inadvertent computerized transcription errors may be present.   Any transcriptional errors that result from this process are unintentional.              Ruben [name]  October 09, 1945 [id number]  Patient Care Team: [name], FNP as PCP - General ([place]) Croitoru, Dani Gobble, MD as PCP - Cardiology (Cardiology)   Canceled request for abnormal CAT scan.  Patient with numerous medical problems.  Worsening kidney disease progressive to end-stage renal disease now on hemodialysis.  Cardiac disease with myocardial infarction not amenable to stenting December 2022.  Had episode of nausea and vomiting after dialysis.  Concern for abdominal pain.  CAT scan showed gallbladder with some thickening and hyperdense sludge in gallbladder.  Question of cholecystitis.  Patient had [facility].  No definite proof of this time his gallbladder would not be normal given his cardiac and renal disease.  Nonetheless surgery can consult once he is admitted to hospital where surgeons can see him.  He has no leukocytosis.  I suspect he would not be a surgical candidate given his repeat distant myocardial infarction would need cardiac clearance first.  Most likely we will try to manage with IV antibiotics and percutaneous cholecystostomy tube if that does not work.  May need to consider nuclear medicine HIDA scan to definitely prove cholecystitis before doing more aggressive intervention.  Please consult surgery in the morning once patient has been admitted.   Patient Active Problem List   Diagnosis Date Noted   ESRD on hemodialysis ([place]) 06/07/2021    Priority: Medium    Constipation 06/07/2021   Personal history of colonic polyps 06/07/2021   Rectal bleeding 06/07/2021   Colon cancer screening 06/07/2021   Abnormal weight loss 02/08/1218   Acute  systolic heart failure (HCC)    Atrial fibrillation with RVR ([place])    Protein-calorie malnutrition, severe 05/24/2021   AKI (acute kidney injury) ([place])    Encounter for central line placement    Cardiogenic shock ([place])    Acute myocardial infarction ([place]) 04/29/2021   Stage 3 chronic kidney disease ([place]) 07/12/2018   Prediabetes 07/12/2018   Right upper quadrant abdominal pain 08/10/2017   Stomach in right sided position 06/09/2017   Right knee pain 02/15/2016   Right patella fracture 02/12/2016   HTN (hypertension) 02/21/2013   Tobacco abuse 02/21/2013   HLD (hyperlipidemia) 02/21/2013    Past Medical History:  Diagnosis Date   Arthritis    Chronic kidney disease, stage 3b ([place])    ESRD (end stage renal disease) ([place])    ETOH abuse    History of stress test 09/02/2008   showed inferolateral scar without ischemia   Hx of echocardiogram 09/02/2008   was essentially normal   Hyperlipidemia    Hypertension    Myocardial infarction [facility])    Tobacco abuse     Past Surgical History:  Procedure Laterality Date   AV FISTULA PLACEMENT Left 05/27/2021   Procedure: LEFT ARTERIOVENOUS (AV) GRAFT CREATION;  Surgeon: Broadus John, MD;  Location: [place];  Service: Vascular;  Laterality: Left;  PERIPHERAL NERVE BLOCK   BACK SURGERY     Dr Luiz Ochoa involving L3-L4 discectomy.   CARDIOVERSION N/A 05/13/2021   Procedure: CARDIOVERSION;  Surgeon: Larey Dresser, MD;  Location: [facility] ENDOSCOPY;  Service: Cardiovascular;  Laterality: N/A;   HERNIA REPAIR     IR  FLUORO GUIDE CV LINE RIGHT  05/21/2021   IR US GUIDE VASC ACCESS RIGHT  05/21/2021   RIGHT/LEFT HEART CATH AND CORONARY ANGIOGRAPHY N/A 05/09/2021   Procedure: RIGHT/LEFT HEART CATH AND CORONARY ANGIOGRAPHY;  Surgeon: Larey Dresser, MD;  Location: [place] CV LAB;  Service: Cardiovascular;  Laterality: N/A;   SPINE SURGERY     TEE WITHOUT CARDIOVERSION N/A 05/07/2021   Procedure: TRANSESOPHAGEAL ECHOCARDIOGRAM (TEE);  Surgeon: Larey Dresser, MD;  Location: [facility] ENDOSCOPY;  Service: Cardiovascular;  Laterality: N/A;   TEE WITHOUT CARDIOVERSION N/A 05/13/2021   Procedure: TRANSESOPHAGEAL ECHOCARDIOGRAM (TEE);  Surgeon: Larey Dresser, MD;  Location: [facility] ENDOSCOPY;  Service: Cardiovascular;  Laterality: N/A;    Social History   Socioeconomic History   Marital status: Single    Spouse name: Not on file   Number of children: Not on file   Years of education: Not on file   Highest education level: Not on file  Occupational History   Not on file  Tobacco Use   Smoking status: Former    Packs/day: 1.50    Years: 20.00    Pack years: 30.00    Types: Cigarettes   Smokeless tobacco: Never   Tobacco comments:    down to 2 packs per week; 3/21 - down to 1 Pack per week  Vaping Use   Vaping Use: Never used  Substance and Sexual Activity   Alcohol use: No    Alcohol/week: 0.0 standard drinks   Drug use: No   Sexual activity: Not on file  Other Topics Concern   Not on file  Social History Narrative   Not on file   Social Determinants of Health   Financial Resource Strain: Medium Risk   Difficulty of Paying Living Expenses: Somewhat hard  Food Insecurity: No Food Insecurity   Worried About Charity fundraiser in the Last Year: Never true   Ran Out of Food in the Last Year: Never true  Transportation Needs: No Transportation Needs   Lack of Transportation (Medical): No   Lack of Transportation (Non-Medical): No  Physical Activity: Not on file  Stress: Not on file  Social Connections: Not on file  Intimate Partner Violence: Not on file    Family History  Problem Relation Age of Onset   Heart attack Brother    Diabetes Brother    Heart disease Brother    Cancer Father    Diabetes Brother    Heart disease Brother     Current Facility-Administered Medications  Medication Dose Route Frequency Provider Last Rate Last Admin   cefTRIAXone (ROCEPHIN) 2 g in sodium chloride 0.9 % 100 mL IVPB  2 g Intravenous  Once Sponseller, Rebekah R, PA-C       Current Outpatient Medications  Medication Sig Dispense Refill   acetaminophen (TYLENOL) 650 MG CR tablet Take 650 mg by mouth every 8 (eight) hours as needed for pain.     albuterol (PROVENTIL) (2.5 MG/3ML) 0.083% nebulizer solution Take 3 mLs (2.5 mg total) by nebulization every 4 (four) hours as needed for wheezing or shortness of breath. (Patient not taking: Reported on 04/29/2021) 75 mL 2   amiodarone (PACERONE) 200 MG tablet Take 1 tablet (200 mg total) by mouth daily. 30 tablet 6   apixaban (ELIQUIS) 5 MG TABS tablet Take 1 tablet (5 mg total) by mouth 2 (two) times daily. 60 tablet 6   atorvastatin (LIPITOR) 80 MG tablet Take 1 tablet (80 mg total) by mouth daily. [place]  tablet 6   cholecalciferol (VITAMIN D3) 25 MCG (1000 UNIT) tablet Take 1,000 Units by mouth daily.     diclofenac Sodium (VOLTAREN) 1 % GEL Apply 2 g topically 4 (four) times daily. (Patient taking differently: Apply 2 g topically daily as needed (pain).) 100 g 2   lidocaine-hydrocortisone (ANAMANTLE) 3-1 % KIT Place 1 application rectally 2 (two) times daily. 1 kit 0   Magnesium 200 MG TABS Take 1 tablet (200 mg total) by mouth daily. With evening meal (Patient taking differently: Take 200 mg by mouth daily.) 30 tablet 2   midodrine (PROAMATINE) 10 MG tablet Take 1 tablet (10 mg total) by mouth 3 (three) times daily with meals. 90 tablet 6   multivitamin (RENA-VIT) TABS tablet Take 1 tablet by mouth at bedtime. 30 tablet 6   nitroGLYCERIN (NITROSTAT) 0.4 MG SL tablet Place 1 tablet (0.4 mg total) under the tongue every 5 (five) minutes as needed for chest pain. 100 tablet 3   ondansetron (ZOFRAN) 4 MG tablet Take 1 tablet (4 mg total) by mouth daily as needed for nausea or vomiting. 30 tablet 1   phenylephrine (,USE FOR PREPARATION-H,) 0.25 % suppository Place 1 suppository rectally 2 (two) times daily. (Patient not taking: Reported on 06/04/2021) 12 suppository 0   polyethylene glycol  powder (GLYCOLAX/MIRALAX) powder Take 17 g by mouth 2 (two) times daily as needed. 578 g 1   traMADol (ULTRAM) 50 MG tablet Take 1 tablet (50 mg total) by mouth every 6 (six) hours as needed. 20 tablet 0     Allergies  Allergen Reactions   Other Other (See Comments)    Pollen - unknown    BP (!) 103/57    Pulse 83    Temp 98.2 F (36.8 C) (Oral)    Resp 17    Ht 5' 11"  (1.803 m)    Wt 78 kg    SpO2 96%    BMI 23.98 kg/m   CT Abdomen Pelvis Wo Contrast  Result Date: 06/07/2021 CLINICAL DATA:  Rectal pain.  Vomiting.  Dialysis today. EXAM: CT ABDOMEN AND PELVIS WITHOUT CONTRAST TECHNIQUE: Multidetector CT imaging of the abdomen and pelvis was performed following the standard protocol without IV contrast. RADIATION DOSE REDUCTION: This exam was performed according to the departmental dose-optimization program which includes automated exposure control, adjustment of the mA and/or kV according to patient size and/or use of iterative reconstruction technique. COMPARISON:  06/02/2021 FINDINGS: Lower chest: Small bilateral pleural effusions with basilar atelectasis, developing since prior study. Cardiac enlargement. Coronary artery calcifications. Hepatobiliary: Low-attenuation lesions measuring up to about 12 mm diameter demonstrated in the liver, most prominently in the left lobe. These are incompletely characterized on noncontrast imaging. If the patient is able to have contrast material, repeat CT with contrast recommended. Otherwise, consider ultrasound or MRI in follow-up to exclude liver mass or metastasis. Diffusely increased density of the gallbladder, more prominent than on prior study. This may represent milk of calcium sludge or large stones. The gallbladder wall appears thickened and cholecystitis is likely. No bile duct dilatation. Pancreas: Unenhanced appearance is unremarkable. Spleen: Unenhanced appearance is unremarkable. Adrenals/Urinary Tract: No adrenal gland nodules. Multiple poorly  defined low-attenuation lesions throughout both kidneys, largest measuring about 1.8 cm diameter. Some of these are hyperdense. These are incompletely characterized on noncontrast imaging but probably represent simple and hemorrhagic cysts. No change in appearance since prior study. Consider follow-up on MRI or contrast-enhanced CT as previously recommended. No hydronephrosis or hydroureter. Bladder is unremarkable. Stomach/Bowel:  Stomach, small bowel, and colon are not abnormally distended. The colon is filled with residual contrast material. Appendix is normal. Vascular/Lymphatic: Calcification of the aorta. No aneurysm. Moderately prominent lymph nodes in the abdominal hiatus and gastrohepatic ligament. These are nonspecific but could represent metastatic disease or inflammatory nodes. Reproductive: Prostate gland is moderately enlarged. Other: No free air or free fluid in the abdomen. Abdominal wall musculature appears intact. Probable postoperative changes in the right inguinal region. Musculoskeletal: Degenerative changes in the spine. Sclerotic and lucent changes in the femoral heads bilaterally, greater on the right, likely representing avascular necrosis. IMPRESSION: 1. Small bilateral pleural effusions with basilar atelectasis, developing since prior study. 2. Indeterminate lesions in the liver and kidneys as discussed above. Consider follow-up with ultrasound, contrast-enhanced CT or MRI. 3. Increasing density of the gallbladder, likely milk of calcium or possibly large stones. Gallbladder wall thickening. Changes may represent acute cholecystitis. 4. No evidence of bowel obstruction or inflammation. Residual contrast material throughout the colon. 5. 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 6. Aortic atherosclerosis. Electronically Signed   By: Lucienne Capers M.D.   On: 06/07/2021 18:31   CT ABDOMEN PELVIS WO CONTRAST  Result Date: 06/02/2021 CLINICAL  DATA:  76 year old male with history of acute onset of nonlocalized abdominal pain. Abdominal distension. Constipation for 1 week. EXAM: CT ABDOMEN AND PELVIS WITHOUT CONTRAST TECHNIQUE: Multidetector CT imaging of the abdomen and pelvis was performed following the standard protocol without IV contrast. RADIATION DOSE REDUCTION: This exam was performed according to the departmental dose-optimization program which includes automated exposure control, adjustment of the mA and/or kV according to patient size and/or use of iterative reconstruction technique. COMPARISON:  CT the abdomen and pelvis 08/10/2017. FINDINGS: Lower chest: Atherosclerotic calcifications in the thoracic aorta as well as the left main, left anterior descending, left circumflex and right coronary arteries. Central venous catheter tip terminating in the right atrium. Hepatobiliary: Multiple low-attenuation lesions scattered throughout the liver, similar in size and number to prior study, incompletely characterized on today's noncontrast CT examination, but likely to represent cysts. There is some amorphous high attenuation material lying dependently in the gallbladder, compatible with biliary sludge and/or tiny partially calcified gallstones. Gallbladder is not distended. No pericholecystic fluid or surrounding inflammatory changes to suggest an acute cholecystitis at this time. Pancreas: No definite pancreatic mass or peripancreatic fluid collections or inflammatory changes are noted on today's noncontrast CT examination. Spleen: Unremarkable. Adrenals/Urinary Tract: Low-attenuation lesions in the kidneys bilaterally measuring up to 1.7 cm in the upper pole of the right kidney, incompletely characterized on today's non-contrast CT examination, but statistically likely to represent cysts. In addition, in the lateral aspect of the interpolar region of the left kidney there is a 1 cm high attenuation lesion which is incompletely characterize, but  likely a proteinaceous/hemorrhagic cyst. Bilateral adrenal glands are normal in appearance. No hydroureteronephrosis. Urinary bladder is normal in appearance. Stomach/Bowel: Unenhanced appearance of the stomach is normal. There is no pathologic dilatation of small bowel or colon. A few scattered colonic diverticulae are noted, without surrounding inflammatory changes to suggest an acute diverticulitis at this time. Normal appendix. Vascular/Lymphatic: Aortic atherosclerosis, aortic atherosclerosis. No lymphadenopathy noted in the abdomen or pelvis. Reproductive: Prostate gland and seminal vesicles are unremarkable in appearance. Other: No significant volume of ascites.  No pneumoperitoneum. Musculoskeletal: There are no aggressive appearing lytic or blastic lesions noted in the visualized portions of the skeleton. IMPRESSION: 1. No acute findings are noted in the  abdomen or pelvis to account for the patient's symptoms. 2. Colonic diverticulosis without evidence of acute diverticulitis at this time. 3. Numerous renal lesions bilaterally, incompletely characterized on today's noncontrast examination, but likely to represent a combination of small cysts and proteinaceous/hemorrhagic cysts. These could be definitively characterized with follow-up nonemergent abdominal MRI with and without IV gadolinium if of clinical concern. 4. Biliary sludge and/or tiny partially calcified gallstones lying dependently in the gallbladder. No findings to suggest an acute cholecystitis at this time. 5. Aortic atherosclerosis, in addition to left main and three-vessel coronary artery disease. Assessment for potential risk factor modification, dietary therapy or pharmacologic therapy may be warranted, if clinically indicated. 6. Additional incidental findings, as above. Electronically Signed   By: Vinnie Langton M.D.   On: 06/02/2021 10:27   CARDIAC CATHETERIZATION  Result Date: 05/09/2021   Prox RCA to Mid RCA lesion is 100%  stenosed.   Ost Cx to Prox Cx lesion is 40% stenosed.   Prox Cx to Mid Cx lesion is 50% stenosed.   Mid Cx lesion is 80% stenosed.   Dist Cx lesion is 80% stenosed.   Mid LM lesion is 30% stenosed.   Prox LAD lesion is 60% stenosed.   1st Diag lesion is 70% stenosed.   Mid LAD-1 lesion is 70% stenosed.   Mid LAD-2 lesion is 50% stenosed.   Mid LAD-3 lesion is 50% stenosed.   Dist LAD lesion is 40% stenosed.   [place] lesion is 60% stenosed. 1. Elevated left and right heart filling pressures, preserved cardiac output on milrinone 0.25. 2. Occluded RCA, this is cause of his out of hospital MI prior to admission.  Collaterals from the left system. 3. Diffuse moderate disease throughout the LAD and LCx.  Suspect the totality of disease would lead to ischemia in both territories.  No good PCI targets given diffuse disease. Options for revascularization will likely be limited.  No PCI target.  Will ask for TCTS opinion on CABG, but with CHF and renal failure he would be a difficult candidate.   US RENAL  Result Date: 05/14/2021 CLINICAL DATA:  Acute kidney injury. EXAM: RENAL / URINARY TRACT ULTRASOUND COMPLETE COMPARISON:  None. FINDINGS: Right Kidney: Renal measurements: 11.6 x 5.7 x 5.7 cm = volume: 199 mL. Increased echogenicity of renal parenchyma is noted. 6 mm nonobstructive calculus is seen in upper pole. Two simple cysts are noted, the largest measuring 2.1 cm. No mass or hydronephrosis visualized. Left Kidney: Renal measurements: 12.0 x 6.3 x 6.3 cm = volume: 247 mL. Increased echogenicity is noted suggesting medical renal disease. 2 simple cysts are noted, the largest measuring 1.9 cm. No mass or hydronephrosis visualized. Bladder: Appears normal for degree of bladder distention. Other: Moderately enlarged prostate gland is noted. IMPRESSION: Increased echogenicity of renal parenchyma is noted bilaterally consistent with medical renal disease. Bilateral simple renal cysts are noted. No hydronephrosis or renal  obstruction is noted. Nonobstructive right renal calculus is noted. Moderate prostatic enlargement is noted. Electronically Signed   By: Marijo Conception M.D.   On: 05/14/2021 09:16   IR US Guide Vasc Access Right  Result Date: 05/21/2021 INDICATION: 76 year old with acute on chronic kidney disease. Request for a tunneled dialysis catheter. EXAM: FLUOROSCOPIC AND ULTRASOUND GUIDED PLACEMENT OF A TUNNELED DIALYSIS CATHETER Physician: Stephan Minister. Anselm Pancoast, MD MEDICATIONS: Ancef 2 g; The antibiotic was administered within an appropriate time interval prior to skin puncture. ANESTHESIA/SEDATION: Moderate (conscious) sedation was employed during this procedure. A total of Versed  1.59m and fentanyl 50 mcg was administered intravenously at the order of the provider performing the procedure. Total intra-service moderate sedation time: 19 minutes. Patient's level of consciousness and vital signs were monitored continuously by radiology nurse throughout the procedure under the supervision of the provider performing the procedure. FLUOROSCOPY TIME:  Fluoroscopy Time: 18 seconds, 1 mGy COMPLICATIONS: None immediate. PROCEDURE: The procedure was explained to the patient. The risks and benefits of the procedure were discussed and the patient's questions were addressed. Informed consent was obtained from the patient. The patient was placed supine on the interventional table. Ultrasound confirmed a patent right internal jugular vein. Ultrasound image obtained for documentation. The right neck and chest was prepped and draped in a sterile fashion. Maximal barrier sterile technique was utilized including caps, mask, sterile gowns, sterile gloves, sterile drape, hand hygiene and skin antiseptic. The right neck was anesthetized with 1% lidocaine. A small incision was made with #11 blade scalpel. A 21 gauge needle directed into the right internal jugular vein with ultrasound guidance. A micropuncture dilator set was placed. A 23 cm tip to  cuff Palindrome catheter was selected. The skin below the right clavicle was anesthetized and a small incision was made with an #11 blade scalpel. A subcutaneous tunnel was formed to the vein dermatotomy site. The catheter was brought through the tunnel. The vein dermatotomy site was dilated to accommodate a peel-away sheath. The catheter was placed through the peel-away sheath and directed into the central venous structures. The tip of the catheter was placed at superior cavoatrial junction with fluoroscopy. Fluoroscopic images were obtained for documentation. Both lumens were found to aspirate and flush well. The proper amount of heparin was flushed in both lumens. The vein dermatotomy site was closed using a single layer of absorbable suture and Dermabond. Gel-Foam placed in the subcutaneous tract. The catheter was secured to the skin using Prolene suture. Left jugular non tunneled dialysis catheter was removed at the end of the procedure. IMPRESSION: Successful placement of a right jugular tunneled dialysis catheter using ultrasound and fluoroscopic guidance. Electronically Signed   By: AMarkus DaftM.D.   On: 05/21/2021 11:04   DG Chest Port 1 View  Result Date: 06/02/2021 CLINICAL DATA:  Pt presents with constipation. No bm for a week, lots of rectal pain/pressure. Pt was hospitalized with heart attack in December and just dc recently due to kidney damage. Is new dialysis. EXAM: PORTABLE CHEST 1 VIEW COMPARISON:  05/15/2021. FINDINGS: Cardiac silhouette is normal in size. No mediastinal or hilar masses. Clear lungs.  No convincing pleural effusion and no pneumothorax. Right internal jugular tunneled dual lumen central venous catheter is new, tip projects in the lower superior vena cava. Skeletal structures are grossly intact. IMPRESSION: No acute cardiopulmonary disease. Electronically Signed   By: DLajean ManesM.D.   On: 06/02/2021 10:25   DG Chest Port 1 View  Result Date: 05/15/2021 CLINICAL DATA:   Central line placement. EXAM: PORTABLE CHEST 1 VIEW COMPARISON:  Chest radiograph dated May 09, 2021 FINDINGS: Interval placement of left IJ access central line with distal tip in the SVC. Right PICC with distal tip in the distal SVC is unchanged. Stable cardiomegaly with pulmonary vascular congestion. Small bilateral pleural effusions, unchanged. The osseous structures are unremarkable. IMPRESSION: 1. Interval placement of left IJ access central line with distal tip in the SVC. 2.  No other significant interval change. Electronically Signed   By: IKeane PoliceD.O.   On: 05/15/2021 14:40   DG  CHEST PORT 1 VIEW  Result Date: 05/09/2021 CLINICAL DATA:  Shortness of breath and chest pain. EXAM: PORTABLE CHEST 1 VIEW COMPARISON:  Chest x-ray 05/01/2021. FINDINGS: Right upper extremity PICC terminates over the mid SVC, unchanged. The cardiac silhouette is mildly enlarged, unchanged. There is central pulmonary vascular congestion and central and lower lung interstitial opacities which have increased from prior. There is scratch at there are new small bilateral pleural effusions. There is no evidence for pneumothorax or acute fracture. IMPRESSION: 1. Cardiomegaly with new mild interstitial edema and small pleural effusions. Electronically Signed   By: Ronney Asters M.D.   On: 05/09/2021 15:28   ECHO TEE  Result Date: 05/17/2021    TRANSESOPHOGEAL ECHO REPORT   Patient Name:   Ruben [name] [facility] Date of Exam: 05/13/2021 Medical Rec #:  [id number]             Height:       71.0 in Accession #:    [id number]            Weight:       171.5 lb Date of Birth:  [birth year]             BSA:          1.975 m Patient Age:    38 years              BP:           110/74 mmHg Patient Gender: M                     HR:           114 bpm. Exam Location:  Inpatient Procedure: Transesophageal Echo, Cardiac Doppler and Color Doppler Indications:     R94.31 Abnormal EKG  History:         Patient has prior history of Echocardiogram  examinations, most                  recent 05/07/2021. Previous Myocardial Infarction, Abnormal ECG,                  Arrythmias:Atrial Fibrillation; Risk Factors:Hypertension,                  Dyslipidemia and Current Smoker. Cardiac shock.  Sonographer:     Roseanna Rainbow RDCS Referring Phys:  [place] Diagnosing Phys: Kirk Ruths McleanMD PROCEDURE: After discussion of the risks and benefits of a TEE, an informed consent was obtained from the patient. The transesophogeal probe was passed without difficulty through the esophogus of the patient. Imaged were obtained with the patient in a left lateral decubitus position. Sedation performed by different physician. The patient was monitored while under deep sedation. Anesthestetic sedation was provided intravenously by Anesthesiology: 271m of Propofol, 1067mof Lidocaine. The patient's vital signs; including heart rate, blood pressure, and oxygen saturation; remained stable throughout the procedure. The patient developed no complications during the procedure. A successful direct current cardioversion was performed at 200 joules with 1 attempt. IMPRESSIONS  1. Difficult study due to coughing.  2. Left ventricular ejection fraction, by estimation, is 35 to 40%. The left ventricle has moderately decreased function. The left ventricle demonstrates regional wall motion abnormalities with basal to mid inferior and inferolateral akinesis. There is mild left ventricular hypertrophy.  3. Right ventricular systolic function is normal. The right ventricular size is normal.  4. Peak RV-RA gradient 23 mmHg.  5. Left atrial size was mildly dilated. No  left atrial/left atrial appendage thrombus was detected.  6. No ASD or PFO by color doppler.  7. The aortic valve is tricuspid. Aortic valve regurgitation is moderate. No aortic stenosis is present.  8. There was moderate, likely infarct-related, mitral regurgitation. PISA ERO 0.09 cm^2 (suspect underestimation). No evidence of  mitral stenosis.  9. Normal caliber thoracic aorta with grade IV plaque in the arch. FINDINGS  Left Ventricle: Left ventricular ejection fraction, by estimation, is 35 to 40%. The left ventricle has moderately decreased function. The left ventricle demonstrates regional wall motion abnormalities. The left ventricular internal cavity size was normal in size. There is mild left ventricular hypertrophy. Right Ventricle: The right ventricular size is normal. No increase in right ventricular wall thickness. Right ventricular systolic function is normal. Left Atrium: Left atrial size was mildly dilated. No left atrial/left atrial appendage thrombus was detected. Right Atrium: Right atrial size was normal in size. Pericardium: Trivial pericardial effusion is present. Mitral Valve: There was moderate, likely infarct-related, mitral regurgitation. PISA ERO 0.09 cm^2 (suspect underestimation). The mitral valve is abnormal. Moderate mitral valve regurgitation. No evidence of mitral valve stenosis. Tricuspid Valve: Peak RV-RA gradient 23 mmHg. The tricuspid valve is normal in structure. Tricuspid valve regurgitation is trivial. Aortic Valve: The aortic valve is tricuspid. Aortic valve regurgitation is moderate. No aortic stenosis is present. Pulmonic Valve: The pulmonic valve was normal in structure. Pulmonic valve regurgitation is not visualized. Aorta: Normal caliber thoracic aorta with grade IV plaque in the arch. The aortic root is normal in size and structure. IAS/Shunts: No ASD or PFO by color doppler.  MR Peak grad:    69.6 mmHg MR Mean grad:    44.0 mmHg MR Vmax:         417.00 cm/s MR Vmean:        297.0 cm/s MR PISA:         1.01 cm MR PISA Eff ROA: 9 mm MR PISA Radius:  0.40 cm Dalton McleanMD Electronically signed by Franki Monte Signature Date/Time: 05/17/2021/2:25:43 PM    Final    VAS [place] UPPER EXT VEIN MAPPING (PRE-OP AVF)  Result Date: 05/21/2021 [place] MAPPING Patient Name:  Ruben [name]  [facility]  Date of Exam:   05/21/2021 Medical Rec #: [id number]              Accession #:    [id number] Date of Birth: 1945/11/18              Patient Gender: M Patient Age:   43 years Exam Location:  [facility] Procedure:      VAS [place] UPPER EXT VEIN MAPPING (PRE-OP AVF) Referring Phys: Jannifer Hick --------------------------------------------------------------------------------  Indications: Pre-access. Comparison Study: no prior Performing Technologist: Archie Patten RVS  Examination Guidelines: A complete evaluation includes B-mode imaging, spectral Doppler, color Doppler, and power Doppler as needed of all accessible portions of each vessel. Bilateral testing is considered an integral part of a complete examination. Limited examinations for reoccurring indications may be performed as noted. +-----------------+-------------+----------+--------------+  Right Cephalic    Diameter (cm) Depth (cm)    Findings     +-----------------+-------------+----------+--------------+  Shoulder                                   not visualized  +-----------------+-------------+----------+--------------+  Prox upper arm  not visualized  +-----------------+-------------+----------+--------------+  Mid upper arm                              not visualized  +-----------------+-------------+----------+--------------+  Dist upper arm        0.13         0.31                    +-----------------+-------------+----------+--------------+  Antecubital fossa     0.18         0.24                    +-----------------+-------------+----------+--------------+  Prox forearm          0.28         0.34                    +-----------------+-------------+----------+--------------+  Mid forearm           0.23         0.20                    +-----------------+-------------+----------+--------------+  Dist forearm          0.21         0.14                     +-----------------+-------------+----------+--------------+  Wrist                 0.21         0.13                    +-----------------+-------------+----------+--------------+ +-----------------+-------------+----------+----------------------------+  Right Basilic     Diameter (cm) Depth (cm)           Findings            +-----------------+-------------+----------+----------------------------+  Prox upper arm                             not visualized and picc line  +-----------------+-------------+----------+----------------------------+  Mid upper arm                              not visualized and picc line  +-----------------+-------------+----------+----------------------------+  Dist upper arm        0.29         0.59                                  +-----------------+-------------+----------+----------------------------+  Antecubital fossa     0.24         0.45                                  +-----------------+-------------+----------+----------------------------+  Prox forearm          0.16         0.16                                  +-----------------+-------------+----------+----------------------------+  Mid forearm           0.15         0.18                                  +-----------------+-------------+----------+----------------------------+  Distal forearm                                    not visualized         +-----------------+-------------+----------+----------------------------+ +-----------------+-------------+----------+--------+  Left Cephalic     Diameter (cm) Depth (cm) Findings  +-----------------+-------------+----------+--------+  Shoulder              0.33         0.76              +-----------------+-------------+----------+--------+  Prox upper arm        0.23         0.37              +-----------------+-------------+----------+--------+  Mid upper arm         0.22         0.31              +-----------------+-------------+----------+--------+  Dist upper arm         0.22         0.48              +-----------------+-------------+----------+--------+  Antecubital fossa     0.31         0.18              +-----------------+-------------+----------+--------+  Prox forearm          0.22         0.22              +-----------------+-------------+----------+--------+  Mid forearm           0.21         0.23              +-----------------+-------------+----------+--------+  Dist forearm          0.21         0.13              +-----------------+-------------+----------+--------+ +-----------------+-------------+----------+---------+  Left Basilic      Diameter (cm) Depth (cm) Findings   +-----------------+-------------+----------+---------+  Prox upper arm        0.31         0.52               +-----------------+-------------+----------+---------+  Mid upper arm         0.21         0.18    branching  +-----------------+-------------+----------+---------+  Dist upper arm        0.25         0.18               +-----------------+-------------+----------+---------+  Antecubital fossa     0.24         0.26               +-----------------+-------------+----------+---------+  Prox forearm          0.27         0.13               +-----------------+-------------+----------+---------+  Mid forearm           0.20         0.11               +-----------------+-------------+----------+---------+  Distal forearm        0.23         0.14               +-----------------+-------------+----------+---------+ *  See table(s) above for measurements and observations.  Diagnosing physician: Deitra Mayo MD Electronically signed by Deitra Mayo MD on 05/21/2021 at 3:22:50 PM.    Final

## 2021-06-07 NOTE — ED Provider Notes (Signed)
°[facility] EMERGENCY DEPARTMENT °Provider Note ° ° °CSN: [id number] °Arrival date & time: 06/07/21  1528 ° °  ° °History ° °Chief Complaint  °Patient presents with  ° Emesis  ° Rectal Pain  ° ° °Ruben [name] is a 75 y.o. male with history of severe Coronary artery disease who presents with concern for rectal pain, nausea, vomiting, and worsening abdominal pain for the last couple weeks.  Patient denies any fevers but does endorse chills.  Pain is worse after eating as well as nausea and vomiting therefore he has not been eating well or drinking well. ° °Patient describes symptoms of rectal prolapse after bowel movements with self reduction. ° °I personally read his baseline crackers.  He had late presentation MI in December 2022 which progressed to cardiogenic shock.  At that time he had diffuse disease on cardiac catheterization that rendered him a poor candidate for PCI.  Additionally given subsequent acute systolic CHF and renal failure, poor candidate for CABG.  Patient started dialysis last month with fistula in the left arm. ° Also has hx of alcohol abuse,  HLD, depression. °HPI ° °  ° °Home Medications °Prior to Admission medications   °Medication Sig Start Date End Date Taking? Authorizing Provider  °acetaminophen (TYLENOL) 650 MG CR tablet Take 650 mg by mouth every 8 (eight) hours as needed for pain.    [provider]  °albuterol (PROVENTIL) (2.5 MG/3ML) 0.083% nebulizer solution Take 3 mLs (2.5 mg total) by nebulization every 4 (four) hours as needed for wheezing or shortness of breath. °Patient not taking: Reported on 04/29/2021 04/03/21 04/03/22  Moore, Janece, FNP  °amiodarone (PACERONE) 200 MG tablet Take 1 tablet (200 mg total) by mouth daily. 05/28/21   Clegg, Amy D, NP  °apixaban (ELIQUIS) 5 MG TABS tablet Take 1 tablet (5 mg total) by mouth 2 (two) times daily. 05/28/21   Clegg, Amy D, NP  °atorvastatin (LIPITOR) 80 MG tablet Take 1 tablet (80 mg total) by mouth daily.  05/28/21   Clegg, Amy D, NP  °cholecalciferol (VITAMIN D3) 25 MCG (1000 UNIT) tablet Take 1,000 Units by mouth daily.    [provider]  °diclofenac Sodium (VOLTAREN) 1 % GEL Apply 2 g topically 4 (four) times daily. °Patient taking differently: Apply 2 g topically daily as needed (pain). 10/24/19   Moore, Janece, FNP  °lidocaine-hydrocortisone (ANAMANTLE) 3-1 % KIT Place 1 application rectally 2 (two) times daily. 06/02/21   Gray, Samuel A, DO  °Magnesium 200 MG TABS Take 1 tablet (200 mg total) by mouth daily. With evening meal °Patient taking differently: Take 200 mg by mouth daily. 10/22/20   Moore, Janece, FNP  °midodrine (PROAMATINE) 10 MG tablet Take 1 tablet (10 mg total) by mouth 3 (three) times daily with meals. 05/28/21   Clegg, Amy D, NP  °multivitamin (RENA-VIT) TABS tablet Take 1 tablet by mouth at bedtime. 05/28/21   Clegg, Amy D, NP  °nitroGLYCERIN (NITROSTAT) 0.4 MG SL tablet Place 1 tablet (0.4 mg total) under the tongue every 5 (five) minutes as needed for chest pain. 05/28/21 05/28/22  Clegg, Amy D, NP  °ondansetron (ZOFRAN) 4 MG tablet Take 1 tablet (4 mg total) by mouth daily as needed for nausea or vomiting. 06/04/21 06/04/22  Moore, Janece, FNP  °phenylephrine (,USE FOR PREPARATION-H,) 0.25 % suppository Place 1 suppository rectally 2 (two) times daily. °Patient not taking: Reported on 06/04/2021 05/30/21   Moore, Janece, FNP  °polyethylene glycol powder (GLYCOLAX/MIRALAX) powder Take 17 g   by mouth 2 (two) times daily as needed. 03/04/16   McVey, Elizabeth Whitney, PA-C  °traMADol (ULTRAM) 50 MG tablet Take 1 tablet (50 mg total) by mouth every 6 (six) hours as needed. 06/04/21 06/04/22  Moore, Janece, FNP  °   ° °Allergies    °Other   ° °Review of Systems   °Review of Systems  °Constitutional:  Positive for activity change, appetite change and chills. Negative for fatigue.  °HENT: Negative.    °Eyes: Negative.   °Respiratory: Negative.    °Cardiovascular: Negative.   °Gastrointestinal:  Positive  for abdominal pain, constipation, nausea, rectal pain and vomiting. Negative for anal bleeding, blood in stool and diarrhea.  °Genitourinary: Negative.   °Musculoskeletal: Negative.   °Skin: Negative.   °Neurological: Negative.   ° °Physical Exam °Updated Vital Signs °BP 108/60    Pulse 81    Temp 98.2 °F (36.8 °C) (Oral)    Resp 18    Ht 5' 11" (1.803 m)    Wt 78 kg    SpO2 90%    BMI 23.98 kg/m²  °Physical Exam °Vitals and nursing note reviewed. Exam conducted with a chaperone present.  °Constitutional:   °   Appearance: He is not ill-appearing or toxic-appearing.  °HENT:  °   Head: Normocephalic and atraumatic.  °   Nose: Nose normal.  °   Mouth/Throat:  °   Mouth: Mucous membranes are moist.  °   Pharynx: No oropharyngeal exudate or posterior oropharyngeal erythema.  °Eyes:  °   General:     °   Right eye: No discharge.     °   Left eye: No discharge.  °   Extraocular Movements: Extraocular movements intact.  °   Conjunctiva/sclera: Conjunctivae normal.  °   Pupils: Pupils are equal, round, and reactive to light.  °Cardiovascular:  °   Rate and Rhythm: Normal rate and regular rhythm.  °   Pulses: Normal pulses.  °   Heart sounds: Normal heart sounds. No murmur heard. °Pulmonary:  °   Effort: Pulmonary effort is normal. No respiratory distress.  °   Breath sounds: Normal breath sounds. No wheezing or rales.  °Abdominal:  °   General: Bowel sounds are normal. There is no distension.  °   Palpations: Abdomen is soft.  °   Tenderness: There is abdominal tenderness in the right upper quadrant. There is no right CVA tenderness, left CVA tenderness, guarding or rebound.  °Genitourinary: °   Rectum: Guaiac result negative. Tenderness present. No external hemorrhoid or internal hemorrhoid. Normal anal tone.  °   Comments: Internal rectal fullness with general tenderness to palpation on rectal exam.  °Musculoskeletal:     °   General: No deformity.  °   Cervical back: Neck supple.  °Skin: °   General: Skin is warm and  dry.  °   Capillary Refill: Capillary refill takes less than 2 seconds.  °Neurological:  °   General: No focal deficit present.  °   Mental Status: He is alert and oriented to person, place, and time. Mental status is at baseline.  °Psychiatric:     °   Mood and Affect: Mood normal.  ° ° °ED Results / Procedures / Treatments   °Labs °(all labs ordered are listed, but only abnormal results are displayed) °Labs Reviewed  °COMPREHENSIVE METABOLIC PANEL - Abnormal; Notable for the following components:  °    Result Value  ° Chloride 95 (*)   °   Glucose, Bld 121 (*)   ° Creatinine, Ser 2.80 (*)   ° Calcium 8.3 (*)   ° Albumin 3.3 (*)   ° AST 55 (*)   ° GFR, Estimated 23 (*)   ° All other components within normal limits  °CBC WITH DIFFERENTIAL/PLATELET - Abnormal; Notable for the following components:  ° RBC 2.99 (*)   ° Hemoglobin 9.4 (*)   ° HCT 28.5 (*)   ° All other components within normal limits  °RESP PANEL BY RT-PCR (FLU A&B, COVID) ARPGX2  °LIPASE, BLOOD  °MAGNESIUM  °OCCULT BLOOD X 1 CARD TO LAB, STOOL  °TROPONIN I (HIGH SENSITIVITY)  ° ° °EKG °None ° °Radiology °CT Abdomen Pelvis Wo Contrast ° °Result Date: 06/07/2021 °CLINICAL DATA:  Rectal pain.  Vomiting.  Dialysis today. EXAM: CT ABDOMEN AND PELVIS WITHOUT CONTRAST TECHNIQUE: Multidetector CT imaging of the abdomen and pelvis was performed following the standard protocol without IV contrast. RADIATION DOSE REDUCTION: This exam was performed according to the departmental dose-optimization program which includes automated exposure control, adjustment of the mA and/or kV according to patient size and/or use of iterative reconstruction technique. COMPARISON:  06/02/2021 FINDINGS: Lower chest: Small bilateral pleural effusions with basilar atelectasis, developing since prior study. Cardiac enlargement. Coronary artery calcifications. Hepatobiliary: Low-attenuation lesions measuring up to about 12 mm diameter demonstrated in the liver, most prominently in the left  lobe. These are incompletely characterized on noncontrast imaging. If the patient is able to have contrast material, repeat CT with contrast recommended. Otherwise, consider ultrasound or MRI in follow-up to exclude liver mass or metastasis. Diffusely increased density of the gallbladder, more prominent than on prior study. This may represent milk of calcium sludge or large stones. The gallbladder wall appears thickened and cholecystitis is likely. No bile duct dilatation. Pancreas: Unenhanced appearance is unremarkable. Spleen: Unenhanced appearance is unremarkable. Adrenals/Urinary Tract: No adrenal gland nodules. Multiple poorly defined low-attenuation lesions throughout both kidneys, largest measuring about 1.8 cm diameter. Some of these are hyperdense. These are incompletely characterized on noncontrast imaging but probably represent simple and hemorrhagic cysts. No change in appearance since prior study. Consider follow-up on MRI or contrast-enhanced CT as previously recommended. No hydronephrosis or hydroureter. Bladder is unremarkable. Stomach/Bowel: Stomach, small bowel, and colon are not abnormally distended. The colon is filled with residual contrast material. Appendix is normal. Vascular/Lymphatic: Calcification of the aorta. No aneurysm. Moderately prominent lymph nodes in the abdominal hiatus and gastrohepatic ligament. These are nonspecific but could represent metastatic disease or inflammatory nodes. Reproductive: Prostate gland is moderately enlarged. Other: No free air or free fluid in the abdomen. Abdominal wall musculature appears intact. Probable postoperative changes in the right inguinal region. Musculoskeletal: Degenerative changes in the spine. Sclerotic and lucent changes in the femoral heads bilaterally, greater on the right, likely representing avascular necrosis. IMPRESSION: 1. Small bilateral pleural effusions with basilar atelectasis, developing since prior study. 2. Indeterminate  lesions in the liver and kidneys as discussed above. Consider follow-up with ultrasound, contrast-enhanced CT or MRI. 3. Increasing density of the gallbladder, likely milk of calcium or possibly large stones. Gallbladder wall thickening. Changes may represent acute cholecystitis. 4. No evidence of bowel obstruction or inflammation. Residual contrast material throughout the colon. 5. 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 6. Aortic atherosclerosis. Electronically Signed   By: William  Stevens M.D.   On: 06/07/2021 18:31   ° °Procedures °Procedures  ° ° °Medications Ordered in ED °Medications  °fentaNYL (SUBLIMAZE) injection 50   mcg (has no administration in time range)  lactated ringers bolus 1,000 mL (0 mLs Intravenous Stopped 06/07/21 2041)  cefTRIAXone (ROCEPHIN) 2 g in sodium chloride 0.9 % 100 mL IVPB (2 g Intravenous New Bag/Given 06/07/21 2041)    ED Course/ Medical Decision Making/ A&P Clinical Course as of 06/07/21 2126  Fri Jun 07, 2021  1959 Consult to general surgery, Dr. Johney Maine, who recommends medical admission and proceeding with IV antibiotics as ordered. Given poor cardiac status, will likely not be a surgical candidate.  I appreciate his collaboration in the care of this patient. [RS]  2046 Consult to hospitalist Dr. Hal Hope was agreeable admission patient to [facility].  Will need to go to [facility] due to requirement for dialysis. [RS]    Clinical Course User Index [RS] Jeanenne Licea, Gypsy Balsam, PA-C                           Medical Decision Making 76 year old male who presents with concern for URQ pain and rectal pain.   The differential diagnosis for RUQ includes but is not limited to:  Cholelithiasis / choledocholithiasis / cholecystitis / cholangitis, hepatitis (eg. viral, alcoholic, toxic),liver abscess, pancreatitis, liver / pancreatic / biliary tract cancer, ischemic hepatopathy (shock liver), hepatic vein obstruction (Budd-Chiari  syndrome), liver cell adenoma, peptic ulcer disease (duodenal), functional or nonulcer dyspepsia, right lower lobe pneumonia, pyelonephritis, urinary calculi,  herpes zoster, trauma or musculoskeletal pain, herniated disk, abdominal abscess, intestinal ischemia, physical or sexual abuse,  appendicitis, UTI/renal colic, IBD.   Borderline hypotensive on intake, vitals otherwise normal.  Cardiopulmonary dam is normal, abdominal exam with right upper quadrant tenderness palpation with positive Murphy sign, generalized abdominal discomfort with palpation without true pain.  Rectal exam performed with chaperone with generalized tenderness on rectal exam but without prolapse, hemorrhoids, or cellulitic changes     Amount and/or Complexity of Data Reviewed Labs: ordered.    Details: CBC without Leukocytosis but with anemia with hemoglobin ofCBC with 9.4.  CMP with creatinine at patient's baseline of 2, dialysis dependent.  Otherwise unremarkable.  Lipase is normal, Hemoccult is negative, magnesium is normal. Radiology: ordered.    Details: CT abdomen pelvis with findings concerning for acute cholecystitis.  Otherwise without acute abnormality. Discussion of management or test interpretation with external provider(s): Case discussed with general surgeon Dr. Johney Maine as above. Case discussed with hospitalist as above.  Risk Prescription drug management. Decision regarding hospitalization.   Given overall poor clinical picture with symptoms and early signs of acute cholecystitis will administer antibiotics and plan to admit this patient to the hospital for continued IV antibiotics and medical stabilization per recommendation of general surgeon.  Patient with poor cardiac status therefore will admit to hospitalist as above. Jaquawn and his family voiced understanding of his medical evaluation and treatment plan.  Each of their questions was answered to their expressed satisfaction.  They are amenable plan for  admission this time.  This chart was dictated using voice recognition software, Dragon. Despite the best efforts of this provider to proofread and correct errors, errors may still occur which can change documentation meaning.  Final Clinical Impression(s) / ED Diagnoses Final diagnoses:  None    Rx / DC Orders ED Discharge Orders     None         Emeline Darling, PA-C 06/07/21 2126    Lorelle Gibbs, DO 06/07/21 2313

## 2021-06-08 ENCOUNTER — Inpatient Hospital Stay (HOSPITAL_COMMUNITY): Payer: Medicare Other

## 2021-06-08 DIAGNOSIS — I5022 Chronic systolic (congestive) heart failure: Secondary | ICD-10-CM | POA: Diagnosis not present

## 2021-06-08 DIAGNOSIS — K5900 Constipation, unspecified: Secondary | ICD-10-CM

## 2021-06-08 DIAGNOSIS — E785 Hyperlipidemia, unspecified: Secondary | ICD-10-CM

## 2021-06-08 DIAGNOSIS — D638 Anemia in other chronic diseases classified elsewhere: Secondary | ICD-10-CM | POA: Diagnosis present

## 2021-06-08 DIAGNOSIS — N2581 Secondary hyperparathyroidism of renal origin: Secondary | ICD-10-CM | POA: Diagnosis present

## 2021-06-08 DIAGNOSIS — K633 Ulcer of intestine: Secondary | ICD-10-CM | POA: Diagnosis not present

## 2021-06-08 DIAGNOSIS — R1310 Dysphagia, unspecified: Secondary | ICD-10-CM

## 2021-06-08 DIAGNOSIS — K626 Ulcer of anus and rectum: Secondary | ICD-10-CM | POA: Diagnosis present

## 2021-06-08 DIAGNOSIS — K7689 Other specified diseases of liver: Secondary | ICD-10-CM | POA: Diagnosis present

## 2021-06-08 DIAGNOSIS — D649 Anemia, unspecified: Secondary | ICD-10-CM

## 2021-06-08 DIAGNOSIS — Z681 Body mass index (BMI) 19 or less, adult: Secondary | ICD-10-CM | POA: Diagnosis not present

## 2021-06-08 DIAGNOSIS — R7989 Other specified abnormal findings of blood chemistry: Secondary | ICD-10-CM | POA: Diagnosis not present

## 2021-06-08 DIAGNOSIS — I9589 Other hypotension: Secondary | ICD-10-CM | POA: Diagnosis present

## 2021-06-08 DIAGNOSIS — R634 Abnormal weight loss: Secondary | ICD-10-CM

## 2021-06-08 DIAGNOSIS — F1010 Alcohol abuse, uncomplicated: Secondary | ICD-10-CM

## 2021-06-08 DIAGNOSIS — J811 Chronic pulmonary edema: Secondary | ICD-10-CM | POA: Diagnosis present

## 2021-06-08 DIAGNOSIS — K7460 Unspecified cirrhosis of liver: Secondary | ICD-10-CM | POA: Diagnosis present

## 2021-06-08 DIAGNOSIS — Z20822 Contact with and (suspected) exposure to covid-19: Secondary | ICD-10-CM | POA: Diagnosis present

## 2021-06-08 DIAGNOSIS — E43 Unspecified severe protein-calorie malnutrition: Secondary | ICD-10-CM | POA: Diagnosis present

## 2021-06-08 DIAGNOSIS — K602 Anal fissure, unspecified: Secondary | ICD-10-CM | POA: Diagnosis not present

## 2021-06-08 DIAGNOSIS — I480 Paroxysmal atrial fibrillation: Secondary | ICD-10-CM | POA: Diagnosis present

## 2021-06-08 DIAGNOSIS — K6289 Other specified diseases of anus and rectum: Secondary | ICD-10-CM

## 2021-06-08 DIAGNOSIS — R1032 Left lower quadrant pain: Secondary | ICD-10-CM | POA: Diagnosis not present

## 2021-06-08 DIAGNOSIS — I132 Hypertensive heart and chronic kidney disease with heart failure and with stage 5 chronic kidney disease, or end stage renal disease: Secondary | ICD-10-CM | POA: Diagnosis present

## 2021-06-08 DIAGNOSIS — Z992 Dependence on renal dialysis: Secondary | ICD-10-CM | POA: Diagnosis not present

## 2021-06-08 DIAGNOSIS — K810 Acute cholecystitis: Secondary | ICD-10-CM

## 2021-06-08 DIAGNOSIS — K5641 Fecal impaction: Secondary | ICD-10-CM | POA: Diagnosis present

## 2021-06-08 DIAGNOSIS — F419 Anxiety disorder, unspecified: Secondary | ICD-10-CM | POA: Diagnosis not present

## 2021-06-08 DIAGNOSIS — R7401 Elevation of levels of liver transaminase levels: Secondary | ICD-10-CM | POA: Diagnosis present

## 2021-06-08 DIAGNOSIS — E871 Hypo-osmolality and hyponatremia: Secondary | ICD-10-CM | POA: Diagnosis present

## 2021-06-08 DIAGNOSIS — N186 End stage renal disease: Secondary | ICD-10-CM | POA: Diagnosis not present

## 2021-06-08 DIAGNOSIS — R109 Unspecified abdominal pain: Secondary | ICD-10-CM | POA: Diagnosis present

## 2021-06-08 DIAGNOSIS — I340 Nonrheumatic mitral (valve) insufficiency: Secondary | ICD-10-CM | POA: Diagnosis present

## 2021-06-08 DIAGNOSIS — I252 Old myocardial infarction: Secondary | ICD-10-CM | POA: Diagnosis not present

## 2021-06-08 DIAGNOSIS — F064 Anxiety disorder due to known physiological condition: Secondary | ICD-10-CM | POA: Diagnosis not present

## 2021-06-08 DIAGNOSIS — J9611 Chronic respiratory failure with hypoxia: Secondary | ICD-10-CM | POA: Diagnosis present

## 2021-06-08 DIAGNOSIS — I2510 Atherosclerotic heart disease of native coronary artery without angina pectoris: Secondary | ICD-10-CM | POA: Diagnosis not present

## 2021-06-08 DIAGNOSIS — Z79899 Other long term (current) drug therapy: Secondary | ICD-10-CM | POA: Diagnosis not present

## 2021-06-08 DIAGNOSIS — F32A Depression, unspecified: Secondary | ICD-10-CM | POA: Diagnosis present

## 2021-06-08 LAB — RENAL FUNCTION PANEL
Albumin: 2.9 g/dL — ABNORMAL LOW (ref 3.5–5.0)
Anion gap: 16 — ABNORMAL HIGH (ref 5–15)
BUN: 25 mg/dL — ABNORMAL HIGH (ref 8–23)
CO2: 29 mmol/L (ref 22–32)
Calcium: 9 mg/dL (ref 8.9–10.3)
Chloride: 96 mmol/L — ABNORMAL LOW (ref 98–111)
Creatinine, Ser: 4.91 mg/dL — ABNORMAL HIGH (ref 0.61–1.24)
GFR, Estimated: 12 mL/min — ABNORMAL LOW (ref >60)
Glucose, Bld: 123 mg/dL — ABNORMAL HIGH (ref 70–99)
Phosphorus: 4.5 mg/dL (ref 2.5–4.6)
Potassium: 4.2 mmol/L (ref 3.5–5.1)
Sodium: 141 mmol/L (ref 135–145)

## 2021-06-08 LAB — CBC
HCT: 28.6 % — ABNORMAL LOW (ref 39.0–52.0)
Hemoglobin: 9.6 g/dL — ABNORMAL LOW (ref 13.0–17.0)
MCH: 32.2 pg (ref 26.0–34.0)
MCHC: 33.6 g/dL (ref 30.0–36.0)
MCV: 96 fL (ref 80.0–100.0)
Platelets: 182 10*3/uL (ref 150–400)
RBC: 2.98 MIL/uL — ABNORMAL LOW (ref 4.22–5.81)
RDW: 14.6 % (ref 11.5–15.5)
WBC: 7.2 10*3/uL (ref 4.0–10.5)
nRBC: 0.3 % — ABNORMAL HIGH (ref 0.0–0.2)

## 2021-06-08 LAB — APTT: aPTT: 77 seconds — ABNORMAL HIGH (ref 24–36)

## 2021-06-08 LAB — TROPONIN I (HIGH SENSITIVITY): Troponin I (High Sensitivity): 66 ng/L — ABNORMAL HIGH (ref <18)

## 2021-06-08 LAB — TSH: TSH: 2.476 u[IU]/mL (ref 0.350–4.500)

## 2021-06-08 LAB — HEPARIN LEVEL (UNFRACTIONATED): Heparin Unfractionated: >1.1 IU/mL — ABNORMAL HIGH (ref 0.30–0.70)

## 2021-06-08 MED ORDER — MIDODRINE HCL 5 MG PO TABS
10.0000 mg | ORAL_TABLET | Freq: Three times a day (TID) | ORAL | Status: DC
  Administered 2021-06-08 – 2021-06-27 (×51): 10 mg via ORAL
  Filled 2021-06-08 (×57): qty 2

## 2021-06-08 MED ORDER — HYDROCORTISONE ACETATE 25 MG RE SUPP
25.0000 mg | Freq: Two times a day (BID) | RECTAL | Status: DC
  Administered 2021-06-08 – 2021-06-27 (×32): 25 mg via RECTAL
  Filled 2021-06-08 (×35): qty 1

## 2021-06-08 MED ORDER — SENNOSIDES-DOCUSATE SODIUM 8.6-50 MG PO TABS
1.0000 | ORAL_TABLET | Freq: Two times a day (BID) | ORAL | Status: DC
  Administered 2021-06-08 (×2): 1 via ORAL
  Filled 2021-06-08 (×2): qty 1

## 2021-06-08 MED ORDER — BISACODYL 10 MG RE SUPP
10.0000 mg | Freq: Every day | RECTAL | Status: DC
  Administered 2021-06-08 – 2021-06-27 (×15): 10 mg via RECTAL
  Filled 2021-06-08 (×18): qty 1

## 2021-06-08 MED ORDER — SORBITOL 70 % SOLN
960.0000 mL | TOPICAL_OIL | Freq: Once | ORAL | Status: AC
  Administered 2021-06-08: 960 mL via RECTAL
  Filled 2021-06-08: qty 473

## 2021-06-08 MED ORDER — ACETAMINOPHEN 650 MG RE SUPP: 650.0000 mg | Freq: Four times a day (QID) | RECTAL | Status: DC | PRN

## 2021-06-08 MED ORDER — ONDANSETRON HCL 4 MG/2ML IJ SOLN
INTRAMUSCULAR | Status: AC
  Filled 2021-06-08: qty 2

## 2021-06-08 MED ORDER — MORPHINE SULFATE (PF) 2 MG/ML IV SOLN
2.0000 mg | INTRAVENOUS | Status: DC | PRN
  Administered 2021-06-09: 2 mg via INTRAVENOUS
  Filled 2021-06-08 (×2): qty 1

## 2021-06-08 MED ORDER — FENTANYL CITRATE PF 50 MCG/ML IJ SOSY
75.0000 ug | PREFILLED_SYRINGE | INTRAMUSCULAR | Status: DC | PRN
  Administered 2021-06-08: 75 ug via INTRAVENOUS
  Filled 2021-06-08: qty 2

## 2021-06-08 MED ORDER — SORBITOL 70 % SOLN
400.0000 mL | TOPICAL_OIL | Freq: Once | ORAL | Status: AC
  Administered 2021-06-09: 400 mL via RECTAL
  Filled 2021-06-08: qty 120

## 2021-06-08 MED ORDER — POLYETHYLENE GLYCOL 3350 17 G PO PACK
17.0000 g | PACK | Freq: Two times a day (BID) | ORAL | Status: DC
  Administered 2021-06-08 – 2021-06-27 (×26): 17 g via ORAL
  Filled 2021-06-08 (×35): qty 1

## 2021-06-08 MED ORDER — MAGNESIUM OXIDE -MG SUPPLEMENT 400 (240 MG) MG PO TABS
200.0000 mg | ORAL_TABLET | Freq: Every day | ORAL | Status: DC
  Administered 2021-06-08 – 2021-06-27 (×19): 200 mg via ORAL
  Filled 2021-06-08 (×20): qty 1

## 2021-06-08 MED ORDER — HEPARIN (PORCINE) 25000 UT/250ML-% IV SOLN
500.0000 [IU]/h | INTRAVENOUS | Status: AC
  Administered 2021-06-08: 16:00:00 700 [IU]/h via INTRAVENOUS
  Administered 2021-06-10: 550 [IU]/h via INTRAVENOUS
  Administered 2021-06-11: 22:00:00 600 [IU]/h via INTRAVENOUS
  Administered 2021-06-13: 550 [IU]/h via INTRAVENOUS
  Filled 2021-06-08 (×4): qty 250

## 2021-06-08 MED ORDER — ATORVASTATIN CALCIUM 80 MG PO TABS
80.0000 mg | ORAL_TABLET | Freq: Every day | ORAL | Status: DC
  Administered 2021-06-08 – 2021-06-11 (×4): 80 mg via ORAL
  Filled 2021-06-08 (×4): qty 1

## 2021-06-08 MED ORDER — ACETAMINOPHEN 325 MG PO TABS
650.0000 mg | ORAL_TABLET | Freq: Four times a day (QID) | ORAL | Status: DC | PRN
  Administered 2021-06-20: 650 mg via ORAL
  Filled 2021-06-08: qty 2

## 2021-06-08 MED ORDER — SODIUM CHLORIDE 0.9% FLUSH
3.0000 mL | Freq: Two times a day (BID) | INTRAVENOUS | Status: DC
  Administered 2021-06-08 – 2021-06-26 (×33): 3 mL via INTRAVENOUS

## 2021-06-08 MED ORDER — METRONIDAZOLE 500 MG/100ML IV SOLN
500.0000 mg | Freq: Two times a day (BID) | INTRAVENOUS | Status: DC
  Filled 2021-06-08: qty 100

## 2021-06-08 MED ORDER — ALBUTEROL SULFATE (2.5 MG/3ML) 0.083% IN NEBU
2.5000 mg | INHALATION_SOLUTION | Freq: Four times a day (QID) | RESPIRATORY_TRACT | Status: DC | PRN
  Administered 2021-06-21: 2.5 mg via RESPIRATORY_TRACT
  Filled 2021-06-08 (×2): qty 3

## 2021-06-08 MED ORDER — TRAMADOL HCL 50 MG PO TABS
50.0000 mg | ORAL_TABLET | Freq: Four times a day (QID) | ORAL | Status: DC | PRN
  Administered 2021-06-08 – 2021-06-18 (×6): 50 mg via ORAL
  Filled 2021-06-08 (×6): qty 1

## 2021-06-08 MED ORDER — SODIUM CHLORIDE 0.9 % IV SOLN: 2.0000 g | INTRAVENOUS | Status: DC

## 2021-06-08 MED ORDER — AMIODARONE HCL 200 MG PO TABS
200.0000 mg | ORAL_TABLET | Freq: Every day | ORAL | Status: DC
  Administered 2021-06-08 – 2021-06-13 (×6): 200 mg via ORAL
  Filled 2021-06-08 (×6): qty 1

## 2021-06-08 MED ORDER — FENTANYL CITRATE PF 50 MCG/ML IJ SOSY
50.0000 ug | PREFILLED_SYRINGE | INTRAMUSCULAR | Status: DC | PRN
  Administered 2021-06-08: 50 ug via INTRAVENOUS
  Filled 2021-06-08 (×2): qty 1

## 2021-06-08 MED ORDER — ONDANSETRON HCL 4 MG/2ML IJ SOLN
4.0000 mg | Freq: Once | INTRAMUSCULAR | Status: AC
  Administered 2021-06-08: 4 mg via INTRAVENOUS
  Filled 2021-06-08: qty 2

## 2021-06-08 NOTE — Progress Notes (Signed)
ANTICOAGULATION CONSULT NOTE - Initial Consult  Pharmacy Consult for Heparin Indication: atrial fibrillation  Allergies  Allergen Reactions   Other Other (See Comments)    Pollen - unknown    Patient Measurements: Height: 5\' 11"  (180.3 cm) Weight: 78 kg (171 lb 15.3 oz) IBW/kg (Calculated) : 75.3 kg Heparin Dosing Weight: 78 kg  Vital Signs: Temp: 98.2 F (36.8 C) (02/04 1945) Temp Source: Oral (02/04 1945) BP: 118/67 (02/04 1945) Pulse Rate: 103 (02/04 1945)  Labs: Recent Labs    06/07/21 1610 06/07/21 2057 06/07/21 2315 06/08/21 1157 06/08/21 2129  HGB 9.4*  --   --  9.6*  --   HCT 28.5*  --   --  28.6*  --   PLT 177  --   --  182  --   APTT  --   --   --   --  77*  HEPARINUNFRC  --   --   --   --  >1.10*  CREATININE 2.80*  --   --  4.91*  --   TROPONINIHS  --  63* 66*  --   --     Estimated Creatinine Clearance: 13.8 mL/min (A) (by C-G formula based on SCr of 4.91 mg/dL (H)).   Medical History: Past Medical History:  Diagnosis Date   Arthritis    Chronic kidney disease, stage 3b ([place] [facility])    ESRD (end stage renal disease) ([place])    ETOH abuse    History of stress test 09/02/2008   showed inferolateral scar without ischemia   Hx of echocardiogram 09/02/2008   was essentially normal   Hyperlipidemia    Hypertension    Myocardial infarction [facility])    Tobacco abuse     Assessment: 76 yo male with a history of atrial fibrillation and CAD presents with nausea, vomiting, rectal pain, and worsening abdominal pain. PTA the patient is on apixaban, last dose on 2/3 in the evening (per the patient and family members). Pharmacy is consulted to dose heparin.  Of note, the patient had a recent admission in 04/2021 for NSTEMI, cardiogenic shock, acute systolic CHF, mitral regurgitation, and aortic valve insufficiency.   aPTT is 77 and therapeutic. Heparin level > 1.1 is supratherapeutic and still affected by apixaban.   Goal of Therapy:  Heparin level 0.3-0.7  units/ml aPTT 66-102 seconds Monitor platelets by anticoagulation protocol: Yes   Plan:  Continue heparin IV 700 units/hr   F/u aPTT until correlates with heparin level  Monitor a daily aPTT, heparin level, and CBC Monitor for signs and symptoms of bleeding  Benetta Spar, PharmD, BCPS, BCCP Clinical Pharmacist  Please check AMION for all [place] phone numbers After 10:00 PM, call [place]

## 2021-06-08 NOTE — Assessment & Plan Note (Addendum)
-  patient reports complaints of constipation and rectal pain for which she has been unable to have adequate bowel movement despite taking stool softeners, sitz bath's, use of Preparation H, and suppositories. CT scan of the abdomen pelvis did not note any signs of fecal impaction or bowel obstruction, but appears to show stool throughout the colon.  GI consulted, status post manual disimpaction.  Continue aggressive bowel regimen, significantly improved, continues to have good bowel movements.  Remains with rectal pain at times.  Continue MiraLAX, Senokot, ensure adequate hydration, hydrocortisone suppositories.

## 2021-06-08 NOTE — Assessment & Plan Note (Addendum)
-  Patient has not not drank alcohol since he was admitted to the hospital in December 2022. Continue to encourage abstinence from alcohol

## 2021-06-08 NOTE — Assessment & Plan Note (Addendum)
-  AST was noted to be mildly elevated at 55, but lipase was noted to be within normal limits.  Possibly related with patient's prior history of abuse.

## 2021-06-08 NOTE — Assessment & Plan Note (Addendum)
-  He has new end-stage renal disease following his most recent hospital stay.  He was discharged January 2023.  Currently MWF.  Just had an AV fistula placed, it has not matured and currently having dialysis via HD cath.

## 2021-06-08 NOTE — Assessment & Plan Note (Signed)
Continue statin. 

## 2021-06-08 NOTE — Assessment & Plan Note (Addendum)
-  Patient appears to be in sinus rhythm at this time and rate controlled.  Continue home medications, anticoagulation with Eliquis

## 2021-06-08 NOTE — H&P (Addendum)
History and Physical    Patient: Ruben [name] BTD:[id number] DOB: [birth year] DOA: 06/07/2021 DOS: the patient was seen and examined on 06/08/2021 PCP: [name], [place]  Patient coming from: Transfer from [facility]  Chief Complaint:  Chief Complaint  Patient presents with   Emesis   Rectal Pain    HPI: Ruben [name] is a 76 y.o. male with medical history significant of hypertension, hyperlipidemia, ESRD on HD, CAD, CHF, A. fib with RVR, prior alcohol abuse, and tobacco abuse who presents with complaints of rectal pain and difficulty swallowing over the last 2 wks.  History is obtained from patient with assistance of his wife and daughter who are present at bedside.  Last hospitalized 04/29/2021-05/28/2021, after presenting with NSTEMI and acute respiratory hypoxia secondary to a systolic heart failure exacerbation.  Patient has undergone heart cath on 1/5, found to have occluded RCA and diffuse disease of the LAD and left circumflex that was not amendable to intervention.  CT surgery had evaluated and patient, but felt not to be a surgical candidate.  TEE noted EF noted to be 35-40%.  Kidney function continued to decline which HD catheter had been placed.  Vascular was consulted and placed AV fistula graft, and patient was set up for hemodialysis in outpatient setting.  Since being discharged from the hospital patient states that he has not been doing well.  He complains of having a poor appetite and being constipated.  Reports having severe rectal pain whenever trying to have a bowel movement with nothing coming out or just a smear.  He had followed up with his primary care provider and been seen in the ED and due to his symptoms.  Reports that he had been taking stool softeners, sitz baths, Preparation H ointment, and suppositories without relief in symptoms.  Wife notes that he will have these episodes where he just started shaking all over due to pain and that she had seen external  hemorrhoids present, but they had been shrinking.  He had not really been able to tolerate the sitz bath's as he feels like his bottom is falling out.  Since his heart attack he also states that he has had little to no appetite and has felt like food and liquid gets stuck in his throat at times which causes him to vomit it back up.  Wife notes 30 pound weight loss over the last month.  Denies any significant fever, blood in stools, or coughing with eating.  On admission patient was found to be afebrile with stable vital signs.  Labs from 2/3 noted hemoglobin 9.4, potassium 3.6, BUN 10, creatinine 2.8, calcium 8.3, AST, ALT 32, and high-sensitivity troponin 63->66.  CT scan of the chest noted small bilateral pleural effusions with bibasilar atelectasis, indeterminate lesions in the liver and kidneys, increased density in the gallbladder concerning for milk of calcium or large gallstones with gallbladder wall thickening, and evidence of necrosis of bilateral femoral heads.  Patient has been given 1 L lactated Ringer's, Zofran, fentanyl for pain, and Rocephin 2 g.  General surgery have been consulted, but recommended hospitalist admission now.  Review of Systems: As mentioned in the history of present illness. All other systems reviewed and are negative. Past Medical History:  Diagnosis Date   Arthritis    Chronic kidney disease, stage 3b ([place])    ESRD (end stage renal disease) ([place])    ETOH abuse    History of stress test 09/02/2008   showed inferolateral scar without ischemia  Hx of echocardiogram 09/02/2008   was essentially normal   Hyperlipidemia    Hypertension    Myocardial infarction [facility])    Tobacco abuse    Past Surgical History:  Procedure Laterality Date   AV FISTULA PLACEMENT Left 05/27/2021   Procedure: LEFT ARTERIOVENOUS (AV) GRAFT CREATION;  Surgeon: Broadus John, MD;  Location: [place];  Service: Vascular;  Laterality: Left;  PERIPHERAL NERVE BLOCK   BACK SURGERY     Dr  Luiz Ochoa involving L3-L4 discectomy.   CARDIOVERSION N/A 05/13/2021   Procedure: CARDIOVERSION;  Surgeon: Larey Dresser, MD;  Location: [facility] ENDOSCOPY;  Service: Cardiovascular;  Laterality: N/A;   HERNIA REPAIR     IR FLUORO GUIDE CV LINE RIGHT  05/21/2021   IR US GUIDE VASC ACCESS RIGHT  05/21/2021   RIGHT/LEFT HEART CATH AND CORONARY ANGIOGRAPHY N/A 05/09/2021   Procedure: RIGHT/LEFT HEART CATH AND CORONARY ANGIOGRAPHY;  Surgeon: Larey Dresser, MD;  Location: [place] CV LAB;  Service: Cardiovascular;  Laterality: N/A;   SPINE SURGERY     TEE WITHOUT CARDIOVERSION N/A 05/07/2021   Procedure: TRANSESOPHAGEAL ECHOCARDIOGRAM (TEE);  Surgeon: Larey Dresser, MD;  Location: [facility] ENDOSCOPY;  Service: Cardiovascular;  Laterality: N/A;   TEE WITHOUT CARDIOVERSION N/A 05/13/2021   Procedure: TRANSESOPHAGEAL ECHOCARDIOGRAM (TEE);  Surgeon: Larey Dresser, MD;  Location: [facility] ENDOSCOPY;  Service: Cardiovascular;  Laterality: N/A;   Social History:  reports that he has quit smoking. His smoking use included cigarettes. He has a 30.00 pack-year smoking history. He has never used smokeless tobacco. He reports that he does not drink alcohol and does not use drugs.  Allergies  Allergen Reactions   Other Other (See Comments)    Pollen - unknown    Family History  Problem Relation Age of Onset   Heart attack Brother    Diabetes Brother    Heart disease Brother    Cancer Father    Diabetes Brother    Heart disease Brother     Prior to Admission medications   Medication Sig Start Date End Date Taking? Authorizing Provider  acetaminophen (TYLENOL) 650 MG CR tablet Take 650 mg by mouth every 8 (eight) hours as needed for pain.    [provider]  albuterol (PROVENTIL) (2.5 MG/3ML) 0.083% nebulizer solution Take 3 mLs (2.5 mg total) by nebulization every 4 (four) hours as needed for wheezing or shortness of breath. Patient not taking: Reported on 04/29/2021 04/03/21 04/03/22  [name], FNP   amiodarone (PACERONE) 200 MG tablet Take 1 tablet (200 mg total) by mouth daily. 05/28/21   Clegg, Amy D, NP  apixaban (ELIQUIS) 5 MG TABS tablet Take 1 tablet (5 mg total) by mouth 2 (two) times daily. 05/28/21   Clegg, Amy D, NP  atorvastatin (LIPITOR) 80 MG tablet Take 1 tablet (80 mg total) by mouth daily. 05/28/21   Clegg, Amy D, NP  cholecalciferol (VITAMIN D3) 25 MCG (1000 UNIT) tablet Take 1,000 Units by mouth daily.    [provider]  diclofenac Sodium (VOLTAREN) 1 % GEL Apply 2 g topically 4 (four) times daily. Patient taking differently: Apply 2 g topically daily as needed (pain). 10/24/19   [name], FNP  lidocaine-hydrocortisone (ANAMANTLE) 3-1 % KIT Place 1 application rectally 2 (two) times daily. 06/02/21   Jeanell Sparrow, DO  Magnesium 200 MG TABS Take 1 tablet (200 mg total) by mouth daily. With evening meal Patient taking differently: Take 200 mg by mouth daily. 10/22/20   Laurance Flatten,  Doreene Burke, FNP  midodrine (PROAMATINE) 10 MG tablet Take 1 tablet (10 mg total) by mouth 3 (three) times daily with meals. 05/28/21   Clegg, Amy D, NP  multivitamin (RENA-VIT) TABS tablet Take 1 tablet by mouth at bedtime. 05/28/21   Clegg, Amy D, NP  nitroGLYCERIN (NITROSTAT) 0.4 MG SL tablet Place 1 tablet (0.4 mg total) under the tongue every 5 (five) minutes as needed for chest pain. 05/28/21 05/28/22  Darrick Grinder D, NP  ondansetron (ZOFRAN) 4 MG tablet Take 1 tablet (4 mg total) by mouth daily as needed for nausea or vomiting. 06/04/21 06/04/22  [name], FNP  phenylephrine (,USE FOR PREPARATION-H,) 0.25 % suppository Place 1 suppository rectally 2 (two) times daily. Patient not taking: Reported on 06/04/2021 05/30/21   [name], FNP  polyethylene glycol powder (GLYCOLAX/MIRALAX) powder Take 17 g by mouth 2 (two) times daily as needed. 03/04/16   McVey, Gelene Mink, PA-C  traMADol (ULTRAM) 50 MG tablet Take 1 tablet (50 mg total) by mouth every 6 (six) hours as needed. 06/04/21 06/04/22   [name], FNP    Physical Exam: Vitals:   06/08/21 0700 06/08/21 0730 06/08/21 0830 06/08/21 0946  BP: 98/69 106/82 107/64 116/68  Pulse: 75 79 77 88  Resp:  _0 Temp:    (!) 97.5 F (36.4 C)  TempSrc:    Oral  SpO2: 92% 95% 93% 100%  Weight:      Height:        Constitutional: Elderly male who appears in distress Eyes: PERRL, lids and conjunctivae normal ENMT: Mucous membranes are moist. Posterior pharynx clear of any exudate or lesions.  Neck: normal, supple, no masses, no thyromegaly Respiratory: Normal respiratory effort with some basilar crackles appreciated but no significant wheezes or rhonchi.  Patient on 2 L of nasal cannula oxygen. Cardiovascular: Regular rate and rhythm, no murmurs / rubs / gallops. No extremity edema.  Right IJ in place.  Left upper extremity fistula in place. Abdomen: no tenderness, no masses palpated.  Bowel sounds positive.  Rectal exam: Stool was present at the rectal vault, but no visible external hemorrhoid or bleeding. Musculoskeletal: no clubbing / cyanosis. No joint deformity upper and lower extremities. Good ROM, no contractures. Normal muscle tone.  Skin: no rashes, lesions, ulcers. No induration Neurologic: CN 2-12 grossly intact.  Able to move all extremities Psychiatric: Normal judgment and insight. Alert and oriented x 3.  Anxious mood.    Data Reviewed: Sinus rhythm at 90 bpm with premature atrial complexes.  QTc was 486.  CT scan of the abdomen and pelvis on does show signs of stool throughout the colon.  Assessment and Plan: * Rectal pain and constipation- (present on admission) Patient reports complaints of constipation and rectal pain for which she has been unable to have adequate bowel movement despite taking stool softeners, sitz bath's, use of Preparation H, and suppositories.  On physical exam there was no clear external hemorrhoids appreciated. CT scan of the abdomen pelvis did not note any signs of fecal impaction  or bowel obstruction, but appears to show stool throughout the colon.  He had been treated treated for the possibility of hemorrhoids. -Senokot S twice daily -Smog enema x1 -Hydrocortisone suppositories twice daily  -[facility] consulted and will plan on evaluating patient tomorrow.  Dysphagia Patient complains of food and liquids getting stuck in his throat causing him to vomit it back up.  Denies complaints of coughing while eating.  He has a prior history  of alcohol abuse. -Speech therapy consulted for modified barium swallow -Question need to add on Carafate due to borderline prolonged QT interval -Check barium esophagram, but unable to be done until 2/6  CAD (coronary artery disease)- (present on admission) Patient had recent NSTEMI 04/2021 for which he underwent cardiac catheterization noting RCA occlusion with diffuse disease of the LAD and left circumflex.  It was not amendable to PCI and he was not a surgical candidate for CABG. -Continue statin  Chronic systolic CHF (congestive heart failure) ([place])- (present on admission) Following NSTEMI patient was noted to have gone into cardiogenic shock and required temporary stay in ICU requiring pressors which were able to be weaned off and he was transition to midodrine.  EF 35-40% on echocardiogram with moderate mitral regurgitation. Currently patient does not appear grossly fluid overloaded. -Continue midodrine -Fluid management per HD  Paroxysmal atrial fibrillation (HCC) Patient appears to be in sinus rhythm at this time and rate controlled. -Held Eliquis -Heparin per pharmacy for possibility of needed procedure -Continue amiodarone 200 mg daily  Suspected acute cholecystitis Patient presented with complaints of abdominal pain with nausea and vomiting.  CT scan of the abdomen pelvis significant for increased density in the gallbladder concerning for milk of calcium or large gallstones with gallbladder wall thickening.  General  surgery has been consulted.  He had initially been started on empiric antibiotics of Rocephin.  On physical exam patient did not have any significant abdominal pain or tenderness.  Denies having any significant epigastric right upper quadrant abdominal pain. -Check abdominal ultrasound -Discontinued IV antibiotics at this time -Appreciate general surgery consultative services.  They evaluated the patient and based off history and ultrasound imaging felt less likely acute cholecystitis.  Symptoms possibly secondary to patient's end-stage renal disease.  ESRD on hemodialysis [facility]) Patient normally dialyzes Monday/Wednesday/Friday.  Right IJ in place.  Nephrology consulted for possible need of dialysis during hospital stay. -Check renal function panel daily -Nephrology made aware the patient being admitted into the hospital and possible need of dialysis  Chronic respiratory failure-(present on admission) -Continue 2 L nasal cannula oxygen  HLD (hyperlipidemia)- (present on admission) -Continue statin  Transaminitis- (present on admission) AST was noted to be mildly elevated at 55, but lipase was noted to be within normal limits.  Possibly related with patient's prior history of abuse.  Normocytic anemia- (present on admission) Stable.  Hemoglobin 9.4-> 9.6.  No reports of bleeding. -Continue to monitor  ETOH abuse Patient has not not drank alcohol since he was admitted to the hospital in December 2022. -Continue to encourage abstinence from alcohol  Weight loss Family reports 30 pound weight loss over the last month.  Question if multifactorial in the recent NSTEMI, ESRD, and constipation with poor p.o. intake. -Add on TSH      Advance Care Planning:   Code Status: Full Code   Consults: GI consulted   Family Communication: Family updated at bedside  Severity of Illness: The appropriate patient status for this patient is INPATIENT. Inpatient status is judged to be reasonable and  necessary in order to provide the required intensity of service to ensure the patient's safety. The patient's presenting symptoms, physical exam findings, and initial radiographic and laboratory data in the context of their chronic comorbidities is felt to place them at high risk for further clinical deterioration. Furthermore, it is not anticipated that the patient will be medically stable for discharge from the hospital within 2 midnights of admission.   * I certify that  at the point of admission it is my clinical judgment that the patient will require inpatient hospital care spanning beyond 2 midnights from the point of admission due to high intensity of service, high risk for further deterioration and high frequency of surveillance required.*  Author: Norval Morton, MD 06/08/2021 11:31 AM  For on call review [email or web address].

## 2021-06-08 NOTE — Progress Notes (Signed)
ANTICOAGULATION CONSULT NOTE - Initial Consult  Pharmacy Consult for Heparin Indication: atrial fibrillation  Allergies  Allergen Reactions   Other Other (See Comments)    Pollen - unknown    Patient Measurements: Height: 5\' 11"  (180.3 cm) Weight: 78 kg (171 lb 15.3 oz) IBW/kg (Calculated) : 75.3 kg Heparin Dosing Weight: 78 kg  Vital Signs: Temp: 97.5 F (36.4 C) (02/04 0946) Temp Source: Oral (02/04 0946) BP: 116/68 (02/04 0946) Pulse Rate: 88 (02/04 0946)  Labs: Recent Labs    06/07/21 1610 06/07/21 2057 06/07/21 2315  HGB 9.4*  --   --   HCT 28.5*  --   --   PLT 177  --   --   CREATININE 2.80*  --   --   TROPONINIHS  --  63* 66*    Estimated Creatinine Clearance: 24.3 mL/min (A) (by C-G formula based on SCr of 2.8 mg/dL (H)).   Medical History: Past Medical History:  Diagnosis Date   Arthritis    Chronic kidney disease, stage 3b ([place])    ESRD (end stage renal disease) ([place])    ETOH abuse    History of stress test 09/02/2008   showed inferolateral scar without ischemia   Hx of echocardiogram 09/02/2008   was essentially normal   Hyperlipidemia    Hypertension    Myocardial infarction [facility])    Tobacco abuse     Assessment: 76 yo male with a history of atrial fibrillation and CAD presents with nausea, vomiting, rectal pain, and worsening abdominal pain. PTA the patient is on apixaban, last dose on 2/3 in the evening (per the patient and family members). Pharmacy is consulted to dose heparin.  Of note, the patient had a recent admission in 04/2021 for NSTEMI, cardiogenic shock, acute systolic CHF, mitral regurgitation, and aortic valve insufficiency.   Will monitor therapy with aPTT until the aPTT begins to correlate with the heparin levels.   Goal of Therapy:  Heparin level 0.3-0.7 units/ml aPTT 66-102 seconds Monitor platelets by anticoagulation protocol: Yes   Plan:  Initiate heparin IV at 700 units/hr (patient was therapeutic at this rate  during the previous admission) Obtain an 8-hour aPTT/heparin level Monitor a daily aPTT, heparin level, and CBC Monitor for signs and symptoms of bleeding  Shauna Hugh, PharmD, [place]  PGY-2 Pharmacy Resident 06/08/2021 12:01 PM  Please check [email or web address] for unit-specific pharmacy phone numbers.

## 2021-06-08 NOTE — ED Provider Notes (Signed)
Checked on patient this morning.  He had a bowel movement with some discomfort.  He has had no vomiting.  Per family, his last dialysis was yesterday which is reassuring.  There was a note in his chart about him having a history of alcohol abuse.  He states he has not had any alcohol since December.  Patient has just received a bed assignment so we will hold off on restarting home meds.   Ruben Johns, MD 06/08/21 [phone]

## 2021-06-08 NOTE — Assessment & Plan Note (Addendum)
-  Patient complains of food and liquids getting stuck in his throat causing him to vomit it back up.  Denies complaints of coughing while eating.  He has a prior history of alcohol abuse.  Management per gastroenterology, currently advanced to regular diet.

## 2021-06-08 NOTE — Consult Note (Signed)
Consultation  Referring Provider:   Dr. Fuller Plan Primary Care Physician:  [name], [place] Primary Gastroenterologist:  [place] (Dr. Benson Norway or Dr. Collene Mares)       Reason for Consultation:     rectal pain, dysphagia   Impression    Rectal pain with history of constipation history of L3-L4 discectomy On CT scan shows possible rectal impaction, constipation Likely this is source of rectal pain  ESRD on hemodialysis ([place]) AV fistula graft  Dysphagia High risk for endoscopic procedures.  S/p catheretization History of smoking 30 pack year smoking history Q Apr 29 4080   Chronic systolic heart failure Ejection fraction 35 to 40% on echocardiogram  Non-STEMI 04/29/2021 Catheterization found occluded RCA and diffuse LAD and left circumflex not amenable to intervention.  Medical managed No chest pain  Paroxysmal atrial fibrillation ([place]) On Eliquis outpatient, on hold.  Abnormal CT scan CT scan showed increased density in gallbladder with gallbladder wall thickening. General surgery consulted However no significant abdominal pain, no right upper quadrant pain [place] with sludge, no pain, no elevated LFTs If worsening pain can consider HIDA  Multiple indeterminate hypodense lesions were seen in the liver on CT abdomen of 06/07/2021.  AST 55 ALT 32  Alkphos 90 TBili 0.9 INR 05/12/2021 1.8  Consider outpatient MRCP Can get AFP  Anemia HGB 9.6 MCV 96.0  WBC 7.2 Platelets 182 Likely anemia of chronic disease, no iron/ferritin in records  Chest wheezing expiratory bilateral On 2 L [place] Bilateral effusions on recent CT abdomen pelvis 02/03    Plan   Will get MBS with speech eval- if negative/inconclusive esophogram.   PPI BID Consider outpatient MRCP for liver lesions, will get AFP Can consider anemia panel. Will get CXR with wheezing bilateral and pleural effusions seen on Ct AB 02/03 Suggest enemas, miralax TID, dulcolax suppositories for rectal pain  likely from fecal impaction, can consider manual fecal disimpaction or colon prep- if this continues can consider outpatient MRI pelvis/anal manometry.  Try to avoid opioid medications, avoid dehydration.   Thank you for your kind consultation, we will continue to follow.         HPI:   Ruben [name] is a 76 y.o. male with past medical history significant for hypertension, hyperlipidemia, end-stage renal disease on dialysis, coronary artery disease, heart failure, A-fib with RVR, prominent prior alcohol and tobacco abuse presents with rectal pain and difficulty swallowing.  Current patient of Dr. Benson Norway.  Patient had recent non-STEMI with acute respiratory hypoxia secondary to systolic heart failure on 12/26 through 1/24, found occluded RCA and diffuse disease of the LAD and left circumflex not a amendable to intervention.  Ejection fraction at that time 35 to 40%.  Kidney function declined that visit and HD catheter was placed.  Wife and daughter present gives some of the history They state prior to NSTEMI in January patient was having small bowel movements once daily, no straining, no sense of incomplete evacuation. Then patient began to have decreased bowel movements, rectal pain. Has tried stool softener, sitz baths, Preparation H without relief. Patient and family state since his hospitalization he has had decreased appetite, feels like food and liquid can become stuck in his throat.  "We are tongue and roof of the mouth is", can have regurgitation.  Does not feel any choking or coughing during eating. Denies reflux or odynophagia. Does have 30-pack-year smoking history quit April 29, 2021. Denies cervical neck surgery, no radiation to throat. Patient  used to drink 1-2 drinks of wine per week drink heavily Christmas weekend otherwise has not had any alcohol since that time. Unable to see records for colonoscopy but per patient less than 10 years ago.  AB [place] 06/08/21 1. Gallbladder  wall is thickened, however, no sonographic Murphy's sign was elicited during the exam to confirm an acute cholecystitis. Differential includes cholecystitis (acute and chronic), CHF, hepatic cirrhosis or hepatitis, and hypoalbuminemia. Consider nuclear medicine HIDA scan for further characterization. 2. Sludge and 5 mm stone within the gallbladder. 3. No focal liver lesion is seen by ultrasound. Multiple indeterminate hypodense lesions were seen in the liver on CT abdomen of 06/07/2021. Recommend further characterization with nonemergent liver MRI.  CT chest AB and Pelvis 06/07/21 1. Small bilateral pleural effusions with basilar atelectasis, developing since prior study. 2. Indeterminate lesions in the liver and kidneys as discussed above. Consider follow-up with ultrasound, contrast-enhanced CT or MRI. 3. Increasing density of the gallbladder, likely milk of calcium or possibly large stones. Gallbladder wall thickening. Changes may represent acute cholecystitis. 4. No evidence of bowel obstruction or inflammation. Residual contrast material throughout the colon. 5. 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 6. Aortic atherosclerosis.  Past Medical History:  Diagnosis Date   Arthritis    Chronic kidney disease, stage 3b ([place])    ESRD (end stage renal disease) ([place])    ETOH abuse    History of stress test 09/02/2008   showed inferolateral scar without ischemia   Hx of echocardiogram 09/02/2008   was essentially normal   Hyperlipidemia    Hypertension    Myocardial infarction [facility])    Tobacco abuse     Surgical History:  He  has a past surgical history that includes Back surgery; Hernia repair; Spine surgery; RIGHT/LEFT HEART CATH AND CORONARY ANGIOGRAPHY (N/A, 05/09/2021); TEE without cardioversion (N/A, 05/07/2021); TEE without cardioversion (N/A, 05/13/2021); Cardioversion (N/A, 05/13/2021); IR Fluoro Guide CV Line Right (05/21/2021); IR  US Guide Vasc Access Right (05/21/2021); and AV fistula placement (Left, 05/27/2021). Family History:  His family history includes Cancer in his father; Diabetes in his brother and brother; Heart attack in his brother; Heart disease in his brother and brother. Social History:   reports that he has quit smoking. His smoking use included cigarettes. He has a 30.00 pack-year smoking history. He has never used smokeless tobacco. He reports that he does not drink alcohol and does not use drugs.  Prior to Admission medications   Medication Sig Start Date End Date Taking? Authorizing Provider  acetaminophen (TYLENOL) 650 MG CR tablet Take 650 mg by mouth every 8 (eight) hours as needed for pain.    [provider]  albuterol (PROVENTIL) (2.5 MG/3ML) 0.083% nebulizer solution Take 3 mLs (2.5 mg total) by nebulization every 4 (four) hours as needed for wheezing or shortness of breath. Patient not taking: Reported on 04/29/2021 04/03/21 04/03/22  [name], FNP  amiodarone (PACERONE) 200 MG tablet Take 1 tablet (200 mg total) by mouth daily. 05/28/21   Clegg, Amy D, NP  apixaban (ELIQUIS) 5 MG TABS tablet Take 1 tablet (5 mg total) by mouth 2 (two) times daily. 05/28/21   Clegg, Amy D, NP  atorvastatin (LIPITOR) 80 MG tablet Take 1 tablet (80 mg total) by mouth daily. 05/28/21   Clegg, Amy D, NP  cholecalciferol (VITAMIN D3) 25 MCG (1000 UNIT) tablet Take 1,000 Units by mouth daily.    [provider]  diclofenac Sodium (VOLTAREN)  1 % GEL Apply 2 g topically 4 (four) times daily. Patient taking differently: Apply 2 g topically daily as needed (pain). 10/24/19   [name], FNP  lidocaine-hydrocortisone (ANAMANTLE) 3-1 % KIT Place 1 application rectally 2 (two) times daily. 06/02/21   Jeanell Sparrow, DO  Magnesium 200 MG TABS Take 1 tablet (200 mg total) by mouth daily. With evening meal Patient taking differently: Take 200 mg by mouth daily. 10/22/20   [name], FNP  midodrine  (PROAMATINE) 10 MG tablet Take 1 tablet (10 mg total) by mouth 3 (three) times daily with meals. 05/28/21   Clegg, Amy D, NP  multivitamin (RENA-VIT) TABS tablet Take 1 tablet by mouth at bedtime. 05/28/21   Clegg, Amy D, NP  nitroGLYCERIN (NITROSTAT) 0.4 MG SL tablet Place 1 tablet (0.4 mg total) under the tongue every 5 (five) minutes as needed for chest pain. 05/28/21 05/28/22  Darrick Grinder D, NP  ondansetron (ZOFRAN) 4 MG tablet Take 1 tablet (4 mg total) by mouth daily as needed for nausea or vomiting. 06/04/21 06/04/22  [name], FNP  phenylephrine (,USE FOR PREPARATION-H,) 0.25 % suppository Place 1 suppository rectally 2 (two) times daily. Patient not taking: Reported on 06/04/2021 05/30/21   [name], FNP  polyethylene glycol powder (GLYCOLAX/MIRALAX) powder Take 17 g by mouth 2 (two) times daily as needed. 03/04/16   McVey, Gelene Mink, PA-C  traMADol (ULTRAM) 50 MG tablet Take 1 tablet (50 mg total) by mouth every 6 (six) hours as needed. 06/04/21 06/04/22  [name], FNP    Current Facility-Administered Medications  Medication Dose Route Frequency Provider Last Rate Last Admin   acetaminophen (TYLENOL) tablet 650 mg  650 mg Oral Q6H PRN Norval Morton, MD       Or   acetaminophen (TYLENOL) suppository 650 mg  650 mg Rectal Q6H PRN Fuller Plan A, MD       albuterol (PROVENTIL) (2.5 MG/3ML) 0.083% nebulizer solution 2.5 mg  2.5 mg Nebulization Q6H PRN Tamala Julian, Rondell A, MD       heparin ADULT infusion 100 units/mL (25000 units/239m)  700 Units/hr Intravenous Continuous GDarlina Sicilian RPH       hydrocortisone (ANUSOL-HC) suppository 25 mg  25 mg Rectal BID STamala Julian Rondell A, MD       midodrine (PROAMATINE) tablet 10 mg  10 mg Oral TID WC Smith, Rondell A, MD       ondansetron (ZOFRAN) 4 MG/2ML injection            senna-docusate (Senokot-S) tablet 1 tablet  1 tablet Oral BID Smith, Rondell A, MD       sodium chloride flush (NS) 0.9 % injection 3 mL  3 mL Intravenous  Q12H Smith, Rondell A, MD       sorbitol, milk of mag, mineral oil, glycerin (SMOG) enema  960 mL Rectal Once SNorval Morton MD       traMADol (Veatrice Bourbon tablet 50 mg  50 mg Oral Q6H PRN SFuller PlanA, MD   50 mg at 06/08/21 1415    Allergies as of 06/07/2021 - Review Complete 06/07/2021  Allergen Reaction Noted   Other Other (See Comments) 04/29/2021    Review of Systems:    Constitutional: No weight loss, fever, chills, weakness or fatigue HEENT: Eyes: No change in vision               Ears, Nose, Throat:  No change in hearing or congestion Skin: No rash or itching Cardiovascular: No  chest pain, chest pressure or palpitations   Respiratory: No SOB or cough Gastrointestinal: See HPI and otherwise negative Genitourinary: No dysuria or change in urinary frequency Neurological: No headache, dizziness or syncope Musculoskeletal: No new muscle or joint pain Hematologic: No bleeding or bruising Psychiatric: No history of depression or anxiety     Physical Exam:  Vital signs in last 24 hours: Temp:  [97.5 F (36.4 C)-98.2 F (36.8 C)] 97.5 F (36.4 C) (02/04 0946) Pulse Rate:  [75-95] 88 (02/04 0946) Resp:  [16-19] 19 (02/04 0946) BP: (95-123)/(45-82) 116/68 (02/04 0946) SpO2:  [90 %-100 %] 100 % (02/04 0946) Weight:  [78 kg] 78 kg (02/03 1806)    General:   chronically ill appearing male in no acute distress Head:  Normocephalic and atraumatic. Eyes: sclerae anicteric,conjunctive pale  Heart:  regular rate and rhythm Pulm: Clear anteriorly; no wheezing Abdomen:  Soft, Obese AB, Sluggish bowel sounds.  no  tenderness.  Rectal exam deferred Extremities:  Without edema. Msk:  Symmetrical without gross deformities. Peripheral pulses intact.  Neurologic:  Alert and  oriented x4;  grossly normal neurologically. Skin:   Dry and intact without significant lesions or rashes. Psychiatric: Demonstrates good judgement and reason without abnormal affect or behaviors.  LAB  RESULTS: Recent Labs    06/07/21 1610 06/08/21 1157  WBC 7.0 7.2  HGB 9.4* 9.6*  HCT 28.5* 28.6*  PLT 177 182   BMET Recent Labs    06/07/21 1610 06/08/21 1157  NA 140 141  K 3.6 4.2  CL 95* 96*  CO2 32 29  GLUCOSE 121* 123*  BUN 10 25*  CREATININE 2.80* 4.91*  CALCIUM 8.3* 9.0   LFT Recent Labs    06/07/21 1610 06/08/21 1157  PROT 7.1  --   ALBUMIN 3.3* 2.9*  AST 55*  --   ALT 32  --   ALKPHOS 90  --   BILITOT 0.9  --    PT/INR No results for input(s): LABPROT, INR in the last 72 hours.  STUDIES: CT Abdomen Pelvis Wo Contrast  Result Date: 06/07/2021 CLINICAL DATA:  Rectal pain.  Vomiting.  Dialysis today. EXAM: CT ABDOMEN AND PELVIS WITHOUT CONTRAST TECHNIQUE: Multidetector CT imaging of the abdomen and pelvis was performed following the standard protocol without IV contrast. RADIATION DOSE REDUCTION: This exam was performed according to the departmental dose-optimization program which includes automated exposure control, adjustment of the mA and/or kV according to patient size and/or use of iterative reconstruction technique. COMPARISON:  06/02/2021 FINDINGS: Lower chest: Small bilateral pleural effusions with basilar atelectasis, developing since prior study. Cardiac enlargement. Coronary artery calcifications. Hepatobiliary: Low-attenuation lesions measuring up to about 12 mm diameter demonstrated in the liver, most prominently in the left lobe. These are incompletely characterized on noncontrast imaging. If the patient is able to have contrast material, repeat CT with contrast recommended. Otherwise, consider ultrasound or MRI in follow-up to exclude liver mass or metastasis. Diffusely increased density of the gallbladder, more prominent than on prior study. This may represent milk of calcium sludge or large stones. The gallbladder wall appears thickened and cholecystitis is likely. No bile duct dilatation. Pancreas: Unenhanced appearance is unremarkable. Spleen:  Unenhanced appearance is unremarkable. Adrenals/Urinary Tract: No adrenal gland nodules. Multiple poorly defined low-attenuation lesions throughout both kidneys, largest measuring about 1.8 cm diameter. Some of these are hyperdense. These are incompletely characterized on noncontrast imaging but probably represent simple and hemorrhagic cysts. No change in appearance since prior study. Consider follow-up on MRI or contrast-enhanced  CT as previously recommended. No hydronephrosis or hydroureter. Bladder is unremarkable. Stomach/Bowel: Stomach, small bowel, and colon are not abnormally distended. The colon is filled with residual contrast material. Appendix is normal. Vascular/Lymphatic: Calcification of the aorta. No aneurysm. Moderately prominent lymph nodes in the abdominal hiatus and gastrohepatic ligament. These are nonspecific but could represent metastatic disease or inflammatory nodes. Reproductive: Prostate gland is moderately enlarged. Other: No free air or free fluid in the abdomen. Abdominal wall musculature appears intact. Probable postoperative changes in the right inguinal region. Musculoskeletal: Degenerative changes in the spine. Sclerotic and lucent changes in the femoral heads bilaterally, greater on the right, likely representing avascular necrosis. IMPRESSION: 1. Small bilateral pleural effusions with basilar atelectasis, developing since prior study. 2. Indeterminate lesions in the liver and kidneys as discussed above. Consider follow-up with ultrasound, contrast-enhanced CT or MRI. 3. Increasing density of the gallbladder, likely milk of calcium or possibly large stones. Gallbladder wall thickening. Changes may represent acute cholecystitis. 4. No evidence of bowel obstruction or inflammation. Residual contrast material throughout the colon. 5. 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 6. Aortic atherosclerosis. Electronically Signed   By: Lucienne Capers M.D.   On: 06/07/2021 18:31   US Abdomen Limited RUQ (LIVER/GB)  Result Date: 06/08/2021 CLINICAL DATA:  Abdominal pain EXAM: ULTRASOUND ABDOMEN LIMITED RIGHT UPPER QUADRANT COMPARISON:  CT abdomen dated 06/07/2021. FINDINGS: Gallbladder: Gallbladder wall is thickened, with demonstrated measurement of 5 mm. Incidental note made of 3 mm and 4 mm polyps adherent to the gallbladder wall. 5 mm stone is seen within the gallbladder. Subtle sludge is seen within the gallbladder. No sonographic Murphy's sign elicited. Common bile duct: Diameter: 2 mm Liver: No focal lesion identified. Within normal limits in echogenicity. Portal vein is patent on color Doppler imaging with normal direction of blood flow towards the liver. Other: None. IMPRESSION: 1. Gallbladder wall is thickened, however, no sonographic Murphy's sign was elicited during the exam to confirm an acute cholecystitis. Differential includes cholecystitis (acute and chronic), CHF, hepatic cirrhosis or hepatitis, and hypoalbuminemia. Consider nuclear medicine HIDA scan for further characterization. 2. Sludge and 5 mm stone within the gallbladder. 3. No focal liver lesion is seen by ultrasound. Multiple indeterminate hypodense lesions were seen in the liver on CT abdomen of 06/07/2021. Recommend further characterization with nonemergent liver MRI. Electronically Signed   By: Franki Cabot M.D.   On: 06/08/2021 14:07     Vladimir Crofts  06/08/2021, 2:22 PM

## 2021-06-08 NOTE — Consult Note (Signed)
[employer] [birth year]  [id number].    Requesting MD: Dr. Tamala Julian Chief Complaint/Reason for Consult: possible cholecystitis  HPI:  Ruben [name] is a 76 year old male who presented to the [place] ED with perirectal pain, nausea and vomiting.  He says he has been having pain and discomfort for about 2 weeks now.  He and his family report that sometimes with bowel movements he has something that protrudes from his rectum.  He endorses constipation and says that bowel movements recently have been mostly smears.  An abdominal CT scan was done in the ED and was concerning for possible cholecystitis.  He was admitted to [facility].  He has multiple medical comorbidities and was admitted in December with an MI with cardiogenic shock.  LHC showed diffuse coronary artery disease that was not amenable to PCI, and he was not a candidate for CABG.  He also has ESRD and is on hemodialysis.  ROS: Review of Systems  Constitutional:  Negative for chills and fever.  Respiratory:  Negative for shortness of breath and wheezing.   Gastrointestinal:  Positive for constipation, nausea and vomiting.  Genitourinary:        Perirectal pain  Neurological:  Negative for seizures and weakness.   Family History  Problem Relation Age of Onset   Heart attack Brother    Diabetes Brother    Heart disease Brother    Cancer Father    Diabetes Brother    Heart disease Brother     Past Medical History:  Diagnosis Date   Arthritis    Chronic kidney disease, stage 3b ([place])    ESRD (end stage renal disease) ([place])    ETOH abuse    History of stress test 09/02/2008   showed inferolateral scar without ischemia   Hx of echocardiogram 09/02/2008   was essentially normal   Hyperlipidemia    Hypertension    Myocardial infarction [facility])    Tobacco abuse     Past Surgical History:  Procedure Laterality Date   AV FISTULA PLACEMENT Left 05/27/2021   Procedure: LEFT ARTERIOVENOUS (AV) GRAFT  CREATION;  Surgeon: Broadus John, MD;  Location: [place];  Service: Vascular;  Laterality: Left;  PERIPHERAL NERVE BLOCK   BACK SURGERY     Dr Luiz Ochoa involving L3-L4 discectomy.   CARDIOVERSION N/A 05/13/2021   Procedure: CARDIOVERSION;  Surgeon: Larey Dresser, MD;  Location: [facility] ENDOSCOPY;  Service: Cardiovascular;  Laterality: N/A;   HERNIA REPAIR     IR FLUORO GUIDE CV LINE RIGHT  05/21/2021   IR US GUIDE VASC ACCESS RIGHT  05/21/2021   RIGHT/LEFT HEART CATH AND CORONARY ANGIOGRAPHY N/A 05/09/2021   Procedure: RIGHT/LEFT HEART CATH AND CORONARY ANGIOGRAPHY;  Surgeon: Larey Dresser, MD;  Location: [place] CV LAB;  Service: Cardiovascular;  Laterality: N/A;   SPINE SURGERY     TEE WITHOUT CARDIOVERSION N/A 05/07/2021   Procedure: TRANSESOPHAGEAL ECHOCARDIOGRAM (TEE);  Surgeon: Larey Dresser, MD;  Location: [facility] ENDOSCOPY;  Service: Cardiovascular;  Laterality: N/A;   TEE WITHOUT CARDIOVERSION N/A 05/13/2021   Procedure: TRANSESOPHAGEAL ECHOCARDIOGRAM (TEE);  Surgeon: Larey Dresser, MD;  Location: [facility] ENDOSCOPY;  Service: Cardiovascular;  Laterality: N/A;    Social History:  reports that he has quit smoking. His smoking use included cigarettes. He has a 30.00 pack-year smoking history. He has never used smokeless tobacco. He reports that he does not drink alcohol and does not use drugs.  Allergies:  Allergies  Allergen Reactions  Other Other (See Comments)    Pollen - unknown    Medications Prior to Admission  Medication Sig Dispense Refill   acetaminophen (TYLENOL) 650 MG CR tablet Take 650 mg by mouth every 8 (eight) hours as needed for pain.     albuterol (PROVENTIL) (2.5 MG/3ML) 0.083% nebulizer solution Take 3 mLs (2.5 mg total) by nebulization every 4 (four) hours as needed for wheezing or shortness of breath. (Patient not taking: Reported on 04/29/2021) 75 mL 2   amiodarone (PACERONE) 200 MG tablet Take 1 tablet (200 mg total) by mouth daily. 30 tablet 6   apixaban  (ELIQUIS) 5 MG TABS tablet Take 1 tablet (5 mg total) by mouth 2 (two) times daily. 60 tablet 6   atorvastatin (LIPITOR) 80 MG tablet Take 1 tablet (80 mg total) by mouth daily. 30 tablet 6   cholecalciferol (VITAMIN D3) 25 MCG (1000 UNIT) tablet Take 1,000 Units by mouth daily.     diclofenac Sodium (VOLTAREN) 1 % GEL Apply 2 g topically 4 (four) times daily. (Patient taking differently: Apply 2 g topically daily as needed (pain).) 100 g 2   lidocaine-hydrocortisone (ANAMANTLE) 3-1 % KIT Place 1 application rectally 2 (two) times daily. 1 kit 0   Magnesium 200 MG TABS Take 1 tablet (200 mg total) by mouth daily. With evening meal (Patient taking differently: Take 200 mg by mouth daily.) 30 tablet 2   midodrine (PROAMATINE) 10 MG tablet Take 1 tablet (10 mg total) by mouth 3 (three) times daily with meals. 90 tablet 6   multivitamin (RENA-VIT) TABS tablet Take 1 tablet by mouth at bedtime. 30 tablet 6   nitroGLYCERIN (NITROSTAT) 0.4 MG SL tablet Place 1 tablet (0.4 mg total) under the tongue every 5 (five) minutes as needed for chest pain. 100 tablet 3   ondansetron (ZOFRAN) 4 MG tablet Take 1 tablet (4 mg total) by mouth daily as needed for nausea or vomiting. 30 tablet 1   phenylephrine (,USE FOR PREPARATION-H,) 0.25 % suppository Place 1 suppository rectally 2 (two) times daily. (Patient not taking: Reported on 06/04/2021) 12 suppository 0   polyethylene glycol powder (GLYCOLAX/MIRALAX) powder Take 17 g by mouth 2 (two) times daily as needed. 578 g 1   traMADol (ULTRAM) 50 MG tablet Take 1 tablet (50 mg total) by mouth every 6 (six) hours as needed. 20 tablet 0     Physical Exam: Blood pressure 116/68, pulse 88, temperature (!) 97.5 F (36.4 C), temperature source Oral, resp. rate 19, height _0  (1.803 m), weight 78 kg, SpO2 100 %. General: resting comfortably, appears stated age, no apparent distress Neurological: alert and oriented, no focal deficits, cranial nerves grossly in tact HEENT:  normocephalic, atraumatic, oropharynx clear, no scleral icterus CV: extremities warm and well-perfused Respiratory: normal work of breathing on room air, symmetric chest wall expansion Abdomen: soft, nondistended, nontender to palpation. No masses or organomegaly. Anorectal: no external hemorrhoids, no prolapse of internal hemorrhoids or signs of rectal prolapse with valsalva. Extremities: warm and well-perfused, no deformities, moving all extremities spontaneously Psychiatric: normal mood and affect Skin: warm and dry, no jaundice, no rashes or lesions   Results for orders placed or performed during the hospital encounter of 06/07/21 (from the past 48 hour(s))  Comprehensive metabolic panel     Status: Abnormal   Collection Time: 06/07/21  4:10 PM  Result Value Ref Range   Sodium 140 135 - 145 mmol/L   Potassium 3.6 3.5 - 5.1 mmol/L   Chloride 95 (  L) 98 - 111 mmol/L   CO2 32 22 - 32 mmol/L   Glucose, Bld 121 (H) 70 - 99 mg/dL    Comment: Glucose reference range applies only to samples taken after fasting for at least 8 hours.   BUN 10 8 - 23 mg/dL   Creatinine, Ser 2.80 (H) 0.61 - 1.24 mg/dL   Calcium 8.3 (L) 8.9 - 10.3 mg/dL   Total Protein 7.1 6.5 - 8.1 g/dL   Albumin 3.3 (L) 3.5 - 5.0 g/dL   AST 55 (H) 15 - 41 U/L   ALT 32 0 - 44 U/L   Alkaline Phosphatase 90 38 - 126 U/L   Total Bilirubin 0.9 0.3 - 1.2 mg/dL   GFR, Estimated 23 (L) >60 mL/min    Comment: (NOTE) Calculated using the CKD-EPI Creatinine Equation (2021)    Anion gap 13 5 - 15    Comment: Performed at [facility], [address] [place], [place] [address]  CBC with Differential     Status: Abnormal   Collection Time: 06/07/21  4:10 PM  Result Value Ref Range   WBC 7.0 4.0 - 10.5 K/uL   RBC 2.99 (L) 4.22 - 5.81 MIL/uL   Hemoglobin 9.4 (L) 13.0 - 17.0 g/dL   HCT 28.5 (L) 39.0 - 52.0 %   MCV 95.3 80.0 - 100.0 fL   MCH 31.4 26.0 - 34.0 pg   MCHC 33.0 30.0 - 36.0 g/dL   RDW 14.7 11.5 - 15.5 %    Platelets 177 150 - 400 K/uL   nRBC 0.0 0.0 - 0.2 %   Neutrophils Relative % 77 %   Neutro Abs 5.4 1.7 - 7.7 K/uL   Lymphocytes Relative 15 %   Lymphs Abs 1.0 0.7 - 4.0 K/uL   Monocytes Relative 8 %   Monocytes Absolute 0.6 0.1 - 1.0 K/uL   Eosinophils Relative 0 %   Eosinophils Absolute 0.0 0.0 - 0.5 K/uL   Basophils Relative 0 %   Basophils Absolute 0.0 0.0 - 0.1 K/uL   Immature Granulocytes 0 %   Abs Immature Granulocytes 0.02 0.00 - 0.07 K/uL    Comment: Performed at [facility], [address] [place], [place] [address]  Lipase, blood     Status: None   Collection Time: 06/07/21  4:10 PM  Result Value Ref Range   Lipase 39 11 - 51 U/L    Comment: Performed at [facility], [address] [place], [place] [address]  Magnesium     Status: None   Collection Time: 06/07/21  4:10 PM  Result Value Ref Range   Magnesium 1.9 1.7 - 2.4 mg/dL    Comment: Performed at [facility], [address] [place], [place] [address]  Occult blood card to lab, stool     Status: None   Collection Time: 06/07/21  5:31 PM  Result Value Ref Range   Fecal Occult Bld NEGATIVE NEGATIVE    Comment: Performed at [facility], [address] [place], [place] [address]  Resp Panel by RT-PCR (Flu A&B, Covid) Nasopharyngeal Swab     Status: None   Collection Time: 06/07/21  8:40 PM   Specimen: Nasopharyngeal Swab; Nasopharyngeal(NP) swabs in vial transport medium  Result Value Ref Range   SARS Coronavirus 2 by RT PCR NEGATIVE NEGATIVE    Comment: (NOTE) SARS-CoV-2 target nucleic acids are NOT DETECTED.  The SARS-CoV-2 RNA is generally  detectable in upper respiratory specimens during the acute phase of infection. The lowest concentration of SARS-CoV-2 viral copies this assay can detect is 138 copies/mL. A negative result does not preclude SARS-Cov-2 infection and should not be used as the sole basis for treatment or other patient management decisions. A  negative result may occur with  improper specimen collection/handling, submission of specimen other than nasopharyngeal swab, presence of viral mutation(s) within the areas targeted by this assay, and inadequate number of viral copies(<138 copies/mL). A negative result must be combined with clinical observations, patient history, and epidemiological information. The expected result is Negative.  Fact Sheet for Patients:  EntrepreneurPulse.com.au  Fact Sheet for Healthcare Providers:  IncredibleEmployment.be  This test is no t yet approved or cleared by the [place] FDA and  has been authorized for detection and/or diagnosis of SARS-CoV-2 by FDA under an Emergency Use Authorization (EUA). This EUA will remain  in effect (meaning this test can be used) for the duration of the COVID-19 declaration under Section 564(b)(1) of the Act, 21 U.S.C.section 360bbb-3(b)(1), unless the authorization is terminated  or revoked sooner.       Influenza A by PCR NEGATIVE NEGATIVE   Influenza B by PCR NEGATIVE NEGATIVE    Comment: (NOTE) The Xpert Xpress SARS-CoV-2/FLU/RSV plus assay is intended as an aid in the diagnosis of influenza from Nasopharyngeal swab specimens and should not be used as a sole basis for treatment. Nasal washings and aspirates are unacceptable for Xpert Xpress SARS-CoV-2/FLU/RSV testing.  Fact Sheet for Patients: EntrepreneurPulse.com.au  Fact Sheet for Healthcare Providers: IncredibleEmployment.be  This test is not yet approved or cleared by the [place] FDA and has been authorized for detection and/or diagnosis of SARS-CoV-2 by FDA under an Emergency Use Authorization (EUA). This EUA will remain in effect (meaning this test can be used) for the duration of the COVID-19 declaration under Section 564(b)(1) of the Act, 21 U.S.C. section 360bbb-3(b)(1), unless the authorization is terminated  or revoked.  Performed at [facility], [address] [place], [place] [address]   Troponin I (High Sensitivity)     Status: Abnormal   Collection Time: 06/07/21  8:57 PM  Result Value Ref Range   Troponin I (High Sensitivity) 63 (H) <18 ng/L    Comment: (NOTE) Elevated high sensitivity troponin I (hsTnI) values and significant  changes across serial measurements may suggest ACS but many other  chronic and acute conditions are known to elevate hsTnI results.  Refer to the "Links" section for chest pain algorithms and additional  guidance. Performed at [facility], [address] [place], [place] [address]   Troponin I (High Sensitivity)     Status: Abnormal   Collection Time: 06/07/21 11:15 PM  Result Value Ref Range   Troponin I (High Sensitivity) 66 (H) <18 ng/L    Comment: (NOTE) Elevated high sensitivity troponin I (hsTnI) values and significant  changes across serial measurements may suggest ACS but many other  chronic and acute conditions are known to elevate hsTnI results.  Refer to the "Links" section for chest pain algorithms and additional  guidance. Performed at [facility], [address] [place], [place] [address]   Renal function panel     Status: Abnormal   Collection Time: 06/08/21 11:57 AM  Result Value Ref Range   Sodium 141 135 - 145 mmol/L   Potassium 4.2 3.5 - 5.1 mmol/L   Chloride 96 (L) 98 - 111 mmol/L  CO2 29 22 - 32 mmol/L   Glucose, Bld 123 (H) 70 - 99 mg/dL    Comment: Glucose reference range applies only to samples taken after fasting for at least 8 hours.   BUN 25 (H) 8 - 23 mg/dL   Creatinine, Ser 4.91 (H) 0.61 - 1.24 mg/dL   Calcium 9.0 8.9 - 10.3 mg/dL   Phosphorus 4.5 2.5 - 4.6 mg/dL   Albumin 2.9 (L) 3.5 - 5.0 g/dL   GFR, Estimated 12 (L) >60 mL/min    Comment: (NOTE) Calculated using the CKD-EPI Creatinine Equation (2021)    Anion gap 16 (H) 5 - 15    Comment: Performed at [facility] [address], [place], [place] [address]  CBC     Status: Abnormal   Collection Time: 06/08/21 11:57 AM  Result Value Ref Range   WBC 7.2 4.0 - 10.5 K/uL   RBC 2.98 (L) 4.22 - 5.81 MIL/uL   Hemoglobin 9.6 (L) 13.0 - 17.0 g/dL   HCT 28.6 (L) 39.0 - 52.0 %   MCV 96.0 80.0 - 100.0 fL   MCH 32.2 26.0 - 34.0 pg   MCHC 33.6 30.0 - 36.0 g/dL   RDW 14.6 11.5 - 15.5 %   Platelets 182 150 - 400 K/uL   nRBC 0.3 (H) 0.0 - 0.2 %    Comment: Performed at [facility], [place] [address] [place], [place] [address]   CT Abdomen Pelvis Wo Contrast  Result Date: 06/07/2021 CLINICAL DATA:  Rectal pain.  Vomiting.  Dialysis today. EXAM: CT ABDOMEN AND PELVIS WITHOUT CONTRAST TECHNIQUE: Multidetector CT imaging of the abdomen and pelvis was performed following the standard protocol without IV contrast. RADIATION DOSE REDUCTION: This exam was performed according to the departmental dose-optimization program which includes automated exposure control, adjustment of the mA and/or kV according to patient size and/or use of iterative reconstruction technique. COMPARISON:  06/02/2021 FINDINGS: Lower chest: Small bilateral pleural effusions with basilar atelectasis, developing since prior study. Cardiac enlargement. Coronary artery calcifications. Hepatobiliary: Low-attenuation lesions measuring up to about 12 mm diameter demonstrated in the liver, most prominently in the left lobe. These are incompletely characterized on noncontrast imaging. If the patient is able to have contrast material, repeat CT with contrast recommended. Otherwise, consider ultrasound or MRI in follow-up to exclude liver mass or metastasis. Diffusely increased density of the gallbladder, more prominent than on prior study. This may represent milk of calcium sludge or large stones. The gallbladder wall appears thickened and cholecystitis is likely. No bile duct dilatation. Pancreas: Unenhanced appearance is unremarkable. Spleen: Unenhanced appearance  is unremarkable. Adrenals/Urinary Tract: No adrenal gland nodules. Multiple poorly defined low-attenuation lesions throughout both kidneys, largest measuring about 1.8 cm diameter. Some of these are hyperdense. These are incompletely characterized on noncontrast imaging but probably represent simple and hemorrhagic cysts. No change in appearance since prior study. Consider follow-up on MRI or contrast-enhanced CT as previously recommended. No hydronephrosis or hydroureter. Bladder is unremarkable. Stomach/Bowel: Stomach, small bowel, and colon are not abnormally distended. The colon is filled with residual contrast material. Appendix is normal. Vascular/Lymphatic: Calcification of the aorta. No aneurysm. Moderately prominent lymph nodes in the abdominal hiatus and gastrohepatic ligament. These are nonspecific but could represent metastatic disease or inflammatory nodes. Reproductive: Prostate gland is moderately enlarged. Other: No free air or free fluid in the abdomen. Abdominal wall musculature appears intact. Probable postoperative changes in the right inguinal region. Musculoskeletal: Degenerative changes in the spine. Sclerotic and  lucent changes in the femoral heads bilaterally, greater on the right, likely representing avascular necrosis. IMPRESSION: 1. Small bilateral pleural effusions with basilar atelectasis, developing since prior study. 2. Indeterminate lesions in the liver and kidneys as discussed above. Consider follow-up with ultrasound, contrast-enhanced CT or MRI. 3. Increasing density of the gallbladder, likely milk of calcium or possibly large stones. Gallbladder wall thickening. Changes may represent acute cholecystitis. 4. No evidence of bowel obstruction or inflammation. Residual contrast material throughout the colon. 5. 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 6. Aortic atherosclerosis. Electronically Signed   By: Lucienne Capers M.D.   On:  06/07/2021 18:31      Assessment/Plan Is a 76 year old male with multiple medical comorbidities who presented with perirectal pain as well as nausea and vomiting.  He has had a CT scan and today had a follow-up ultrasound.  I have reviewed both studies.  On CT there was a lot of stool in the rectal vault but no signs of perirectal abscess.  RUQ [place] does not show any gallstones or pericholecystic fluid, although there do appear to be some small polyps.  The patient's symptoms are not consistent with biliary colic, and he has no fevers or leukocytosis.  His clinical presentation and imaging are not consistent with acute cholecystitis.  He does report perianal pain with an intermittent bulge during bowel movements, and it is not clear if he is experiencing prolapse of internal hemorrhoids or rectal prolapse.  There is no prolapse at the time of my exam.  Recommend a good bowel regimen and consider a suppository or enema.  Given his recent MI and significant coronary artery disease, he is not a good surgical candidate at this time.  Surgery will sign off, please call with any further questions or concerns.  Medical decision making: Moderate  Michaelle Birks, [facility], Hepatobiliary and Pancreatic Surgery 06/08/21 1:52 PM

## 2021-06-08 NOTE — Assessment & Plan Note (Deleted)
CT scan of the abdomen pelvis did not note any signs of fecal impaction or bowel obstruction.  He had been treated treated for the possibility of hemorrhoids. -Senokot S twice daily -Smog enema x1 -Hydrocortisone suppositories twice daily

## 2021-06-08 NOTE — Assessment & Plan Note (Addendum)
-  Following NSTEMI in January 2023 patient was noted to have gone into cardiogenic shock and required temporary stay in ICU requiring pressors which were able to be weaned off and he was transition to midodrine.  EF 35-40% on echocardiogram with moderate mitral regurgitation. Currently patient does not appear grossly fluid overloaded.  Continue midodrine, fluid management per dialysis

## 2021-06-08 NOTE — ED Notes (Signed)
[facility] arrived to transport Pt to [facility]. Family provided information to new room.

## 2021-06-08 NOTE — ED Notes (Signed)
Pt report given to [facility] and receiving RN Tiffany at [facility]. All questions and concerns addressed. Pt updated

## 2021-06-08 NOTE — Assessment & Plan Note (Addendum)
-  family reports 20-30 pound weight loss over the last month.  Question if multifactorial in the recent NSTEMI, ESRD, and constipation with poor p.o. intake.  TSH unremarkable.

## 2021-06-08 NOTE — Assessment & Plan Note (Addendum)
-  Stable.  No bleeding

## 2021-06-08 NOTE — Assessment & Plan Note (Addendum)
-  Patient presented with complaints of abdominal pain with nausea and vomiting on admission.  CT scan of the abdomen pelvis significant for increased density in the gallbladder concerning for milk of calcium or large gallstones with gallbladder wall thickening.  General surgery has been consulted and evaluated patient, and they do not feel that imaging and physical exam and symptoms are consistent with acute cholecystitis.  They signed off.  He was initially placed on ceftriaxone but it has been discontinued now

## 2021-06-08 NOTE — Assessment & Plan Note (Addendum)
-  Patient had recent NSTEMI 04/2021 for which he underwent cardiac catheterization noting RCA occlusion with diffuse disease of the LAD and left circumflex.  It was not amendable to PCI and he was not a surgical candidate for CABG. Continue statin

## 2021-06-09 ENCOUNTER — Inpatient Hospital Stay (HOSPITAL_COMMUNITY): Payer: Medicare Other

## 2021-06-09 DIAGNOSIS — K6289 Other specified diseases of anus and rectum: Secondary | ICD-10-CM | POA: Diagnosis not present

## 2021-06-09 DIAGNOSIS — K5900 Constipation, unspecified: Secondary | ICD-10-CM | POA: Diagnosis not present

## 2021-06-09 DIAGNOSIS — R109 Unspecified abdominal pain: Secondary | ICD-10-CM | POA: Diagnosis not present

## 2021-06-09 DIAGNOSIS — R1310 Dysphagia, unspecified: Secondary | ICD-10-CM | POA: Diagnosis not present

## 2021-06-09 LAB — RENAL FUNCTION PANEL
Albumin: 3 g/dL — ABNORMAL LOW (ref 3.5–5.0)
Anion gap: 18 — ABNORMAL HIGH (ref 5–15)
BUN: 40 mg/dL — ABNORMAL HIGH (ref 8–23)
CO2: 27 mmol/L (ref 22–32)
Calcium: 9.1 mg/dL (ref 8.9–10.3)
Chloride: 95 mmol/L — ABNORMAL LOW (ref 98–111)
Creatinine, Ser: 5.9 mg/dL — ABNORMAL HIGH (ref 0.61–1.24)
GFR, Estimated: 9 mL/min — ABNORMAL LOW (ref >60)
Glucose, Bld: 151 mg/dL — ABNORMAL HIGH (ref 70–99)
Phosphorus: 5.8 mg/dL — ABNORMAL HIGH (ref 2.5–4.6)
Potassium: 4.4 mmol/L (ref 3.5–5.1)
Sodium: 140 mmol/L (ref 135–145)

## 2021-06-09 LAB — CBC
HCT: 28.2 % — ABNORMAL LOW (ref 39.0–52.0)
Hemoglobin: 9.1 g/dL — ABNORMAL LOW (ref 13.0–17.0)
MCH: 30.8 pg (ref 26.0–34.0)
MCHC: 32.3 g/dL (ref 30.0–36.0)
MCV: 95.6 fL (ref 80.0–100.0)
Platelets: 185 10*3/uL (ref 150–400)
RBC: 2.95 MIL/uL — ABNORMAL LOW (ref 4.22–5.81)
RDW: 14.6 % (ref 11.5–15.5)
WBC: 9.4 10*3/uL (ref 4.0–10.5)
nRBC: 0.2 % (ref 0.0–0.2)

## 2021-06-09 LAB — APTT
aPTT: 93 seconds — ABNORMAL HIGH (ref 24–36)
aPTT: 112 seconds — ABNORMAL HIGH (ref 24–36)

## 2021-06-09 LAB — HEPARIN LEVEL (UNFRACTIONATED): Heparin Unfractionated: >1.1 IU/mL — ABNORMAL HIGH (ref 0.30–0.70)

## 2021-06-09 MED ORDER — SENNOSIDES-DOCUSATE SODIUM 8.6-50 MG PO TABS
2.0000 | ORAL_TABLET | Freq: Two times a day (BID) | ORAL | Status: DC
  Administered 2021-06-09 – 2021-06-27 (×29): 2 via ORAL
  Filled 2021-06-09 (×36): qty 2

## 2021-06-09 MED ORDER — DARBEPOETIN ALFA 60 MCG/0.3ML IJ SOSY
60.0000 ug | PREFILLED_SYRINGE | INTRAMUSCULAR | Status: DC
  Administered 2021-06-10 – 2021-06-24 (×3): 60 ug via INTRAVENOUS
  Filled 2021-06-09 (×6): qty 0.3

## 2021-06-09 MED ORDER — CHLORHEXIDINE GLUCONATE CLOTH 2 % EX PADS
6.0000 | MEDICATED_PAD | Freq: Every day | CUTANEOUS | Status: DC
  Administered 2021-06-10 – 2021-06-19 (×7): 6 via TOPICAL

## 2021-06-09 MED ORDER — LACTULOSE 10 GM/15ML PO SOLN
20.0000 g | Freq: Two times a day (BID) | ORAL | Status: AC
  Administered 2021-06-09: 20 g via ORAL
  Filled 2021-06-09 (×2): qty 30

## 2021-06-09 MED ORDER — ONDANSETRON HCL 4 MG/2ML IJ SOLN
4.0000 mg | Freq: Four times a day (QID) | INTRAMUSCULAR | Status: DC | PRN
  Administered 2021-06-10 – 2021-06-13 (×3): 4 mg via INTRAVENOUS
  Filled 2021-06-09 (×3): qty 2

## 2021-06-09 MED ORDER — HYDROMORPHONE HCL 1 MG/ML IJ SOLN
0.5000 mg | INTRAMUSCULAR | Status: DC | PRN
  Administered 2021-06-09: 0.5 mg via INTRAVENOUS
  Filled 2021-06-09: qty 0.5

## 2021-06-09 MED ORDER — HYDROMORPHONE HCL 1 MG/ML IJ SOLN
1.0000 mg | INTRAMUSCULAR | Status: DC | PRN
  Administered 2021-06-09 – 2021-06-26 (×12): 1 mg via INTRAVENOUS
  Filled 2021-06-09 (×16): qty 1

## 2021-06-09 NOTE — Evaluation (Signed)
Clinical/Bedside Swallow Evaluation Patient Details  Name: Ruben [name] MRN: [id number] Date of Birth: 1945/05/16  Today's Date: 06/09/2021 Time: SLP Start Time (ACUTE ONLY): 0929 SLP Stop Time (ACUTE ONLY): 0272 SLP Time Calculation (min) (ACUTE ONLY): 15 min  Past Medical History:  Past Medical History:  Diagnosis Date   Arthritis    Chronic kidney disease, stage 3b ([place])    ESRD (end stage renal disease) ([place])    ETOH abuse    History of stress test 09/02/2008   showed inferolateral scar without ischemia   Hx of echocardiogram 09/02/2008   was essentially normal   Hyperlipidemia    Hypertension    Myocardial infarction [facility])    Tobacco abuse    Past Surgical History:  Past Surgical History:  Procedure Laterality Date   AV FISTULA PLACEMENT Left 05/27/2021   Procedure: LEFT ARTERIOVENOUS (AV) GRAFT CREATION;  Surgeon: Broadus John, MD;  Location: [place];  Service: Vascular;  Laterality: Left;  PERIPHERAL NERVE BLOCK   BACK SURGERY     Dr Luiz Ochoa involving L3-L4 discectomy.   CARDIOVERSION N/A 05/13/2021   Procedure: CARDIOVERSION;  Surgeon: Larey Dresser, MD;  Location: [facility] ENDOSCOPY;  Service: Cardiovascular;  Laterality: N/A;   HERNIA REPAIR     IR FLUORO GUIDE CV LINE RIGHT  05/21/2021   IR US GUIDE VASC ACCESS RIGHT  05/21/2021   RIGHT/LEFT HEART CATH AND CORONARY ANGIOGRAPHY N/A 05/09/2021   Procedure: RIGHT/LEFT HEART CATH AND CORONARY ANGIOGRAPHY;  Surgeon: Larey Dresser, MD;  Location: [place] CV LAB;  Service: Cardiovascular;  Laterality: N/A;   SPINE SURGERY     TEE WITHOUT CARDIOVERSION N/A 05/07/2021   Procedure: TRANSESOPHAGEAL ECHOCARDIOGRAM (TEE);  Surgeon: Larey Dresser, MD;  Location: [facility] ENDOSCOPY;  Service: Cardiovascular;  Laterality: N/A;   TEE WITHOUT CARDIOVERSION N/A 05/13/2021   Procedure: TRANSESOPHAGEAL ECHOCARDIOGRAM (TEE);  Surgeon: Larey Dresser, MD;  Location: [facility] ENDOSCOPY;  Service: Cardiovascular;  Laterality: N/A;   HPI:  Pt  is a 76 y.o. male who presented with complaints of rectal pain and difficulty swallowing for 2 week prior to admission. Difficulty swallowing described in H&P as "like food and liquid gets stuck in his throat at times which causes him to vomit it back up." CXR on admission: Increasing bibasilar opacities which may represent atelectasis or airspace disease. GI consulted; MD recommended for esophagram as initial workup since pt's recent NSTEMI places him at high risk for endoscopic procedures; PA recommended MBS and esophagram if MBS is inconclusive. PMH: hypertension, hyperlipidemia, ESRD on HD, CAD, CHF, A. fib with RVR, prior alcohol abuse, NSTEMI, and tobacco abuse.    Assessment / Plan / Recommendation  Clinical Impression  Pt was seen for bedside swallow evaluation with his daughter present. Pt's family reported that the pt has lost ~30lbs since December 26th and that he has been having the sensation "sticking" during p.o. intake. Pt identified the sternal notch as the site of the sensation. Pt denied any alleviating factors and questioned whether some of his symptoms were psychogenic. Oral mechanism exam was [facility] and he presented with adequate, natural dentition. He coughed once with a graham cracker and stated that it was "very dry", but otherwise tolerated all solids and liquids without overt s/sx of aspiration. Oral phase was [facility]. A modified barium swallow study has been requested by GI to rule out pharyngeal dysfunction prior to completion of an esophagram. Pt's family has requested that this not be completed today since pt is in significant  pain and would like to rest. Pt's case was discussed with GI who verbalized agreement with attempting to coordinate these assessments with radiology.  SLP Visit Diagnosis: Dysphagia, unspecified (R13.10)    Aspiration Risk  Mild aspiration risk    Diet Recommendation  (Continue full liquids)   Liquid Administration via: Cup;Straw Medication Administration:  Whole meds with liquid (or with puree per pt's preference) Supervision: Patient able to self feed Compensations: Slow rate;Small sips/bites Postural Changes: Seated upright at 90 degrees    Other  Recommendations Recommended Consults: Consider GI evaluation (Already consulted) Oral Care Recommendations: Oral care BID    Recommendations for follow up therapy are one component of a multi-disciplinary discharge planning process, led by the attending physician.  Recommendations may be updated based on patient status, additional functional criteria and insurance authorization.  Follow up Recommendations  (TBD)      Assistance Recommended at Discharge    Functional Status Assessment Patient has not had a recent decline in their functional status  Frequency and Duration min 2x/week  2 weeks       Prognosis Prognosis for Safe Diet Advancement: Good      Swallow Study   General Date of Onset: 05/21/21 HPI: Pt is a 76 y.o. male who presented with complaints of rectal pain and difficulty swallowing for 2 week prior to admission. Difficulty swallowing described in H&P as "like food and liquid gets stuck in his throat at times which causes him to vomit it back up." CXR on admission: Increasing bibasilar opacities which may represent atelectasis or airspace disease. GI consulted; MD recommended for esophagram as initial workup since pt's recent NSTEMI places him at high risk for endoscopic procedures; PA recommended MBS and esophagram if MBS is inconclusive. PMH: hypertension, hyperlipidemia, ESRD on HD, CAD, CHF, A. fib with RVR, prior alcohol abuse, NSTEMI, and tobacco abuse. Type of Study: Bedside Swallow Evaluation Previous Swallow Assessment: none Diet Prior to this Study: Thin liquids (full liquids) Temperature Spikes Noted: No Respiratory Status: Nasal cannula History of Recent Intubation: No Behavior/Cognition: Alert;Cooperative;Pleasant mood Oral Cavity Assessment: Within Functional  Limits Oral Care Completed by SLP: No Oral Cavity - Dentition: Adequate natural dentition Vision: Functional for self-feeding Self-Feeding Abilities: Able to feed self Patient Positioning: Upright in bed;Postural control adequate for testing Baseline Vocal Quality: Normal Volitional Cough: Strong Volitional Swallow: Able to elicit    Oral/Motor/Sensory Function Overall Oral Motor/Sensory Function: Within functional limits   Ice Chips Ice chips: Within functional limits Presentation: Spoon   Thin Liquid Thin Liquid: Within functional limits Presentation: Straw    Nectar Thick Nectar Thick Liquid: Not tested   Honey Thick Honey Thick Liquid: Not tested   Puree Puree: Within functional limits Presentation: Spoon   Solid     Solid: Impaired Presentation: Self Fed Pharyngeal Phase Impairments: Cough - Immediate     Viraaj Vorndran I. Hardin Negus, [place], [place] Office number [phone] Pager [phone]  Horton Marshall 06/09/2021,10:08 AM

## 2021-06-09 NOTE — Progress Notes (Addendum)
Progress Note   Subjective  Chief Complaint: Rectal pain, dysphagia  Daughter in the room with patient. States patient's been having rectal pain all night without any sleep. Worse with enemas or suppositories.  Had enema last night, enema this morning, per daughter not as much stool came out with enema as she anticipated. Did external exam but patient declined internal exam today.  Has brown loose smeared stool over rectum with no hemorrhoids, lesion seen. Dysphagia is unchanged, no nausea vomiting.    Objective   Vital signs in last 24 hours: Temp:  [98.2 F (36.8 C)-98.7 F (37.1 C)] 98.2 F (36.8 C) (02/05 0609) Pulse Rate:  [90-103] 102 (02/05 0609) Resp:  [18-20] 20 (02/05 0609) BP: (106-118)/(63-67) 106/63 (02/05 0609) SpO2:  [90 %-92 %] 91 % (02/05 0609) Weight:  [78.9 kg] 78.9 kg (02/05 0609) Last BM Date: 06/08/21  General:   chronically ill appearing male, appears to be in pain. Heart:  regular rate and rhythm Pulm: Clear anteriorly; no wheezing Abdomen:  Soft but distened/ Obese AB, Sluggish bowel sounds. Diffuse tenderness.   Rectal exam deferred, external  brown loose smeared stool over rectum with no hemorrhoids, lesion seen. Msk:  Symmetrical without gross deformities. Peripheral pulses intact.  Neurologic:  Alert and  oriented x4;  grossly normal neurologically. Skin:   Dry and intact without significant lesions or rashes. Psychiatric: Demonstrates good judgement and reason.  Intake/Output from previous day: 02/04 0701 - 02/05 0700 In: 391.5 [P.O.:290; I.V.:101.5] Out: -  Intake/Output this shift: No intake/output data recorded.  Lab Results: Recent Labs    06/07/21 1610 06/08/21 1157 06/09/21 0050  WBC 7.0 7.2 9.4  HGB 9.4* 9.6* 9.1*  HCT 28.5* 28.6* 28.2*  PLT 177 182 185   BMET Recent Labs    06/07/21 1610 06/08/21 1157 06/09/21 0050  NA 140 141 140  K 3.6 4.2 4.4  CL 95* 96* 95*  CO2 32 29 27  GLUCOSE 121* 123* 151*  BUN 10 25*  40*  CREATININE 2.80* 4.91* 5.90*  CALCIUM 8.3* 9.0 9.1   LFT Recent Labs    06/07/21 1610 06/08/21 1157 06/09/21 0050  PROT 7.1  --   --   ALBUMIN 3.3*   < > 3.0*  AST 55*  --   --   ALT 32  --   --   ALKPHOS 90  --   --   BILITOT 0.9  --   --    < > = values in this interval not displayed.   PT/INR No results for input(s): LABPROT, INR in the last 72 hours.  Studies/Results: CT Abdomen Pelvis Wo Contrast  Result Date: 06/07/2021 CLINICAL DATA:  Rectal pain.  Vomiting.  Dialysis today. EXAM: CT ABDOMEN AND PELVIS WITHOUT CONTRAST TECHNIQUE: Multidetector CT imaging of the abdomen and pelvis was performed following the standard protocol without IV contrast. RADIATION DOSE REDUCTION: This exam was performed according to the departmental dose-optimization program which includes automated exposure control, adjustment of the mA and/or kV according to patient size and/or use of iterative reconstruction technique. COMPARISON:  06/02/2021 FINDINGS: Lower chest: Small bilateral pleural effusions with basilar atelectasis, developing since prior study. Cardiac enlargement. Coronary artery calcifications. Hepatobiliary: Low-attenuation lesions measuring up to about 12 mm diameter demonstrated in the liver, most prominently in the left lobe. These are incompletely characterized on noncontrast imaging. If the patient is able to have contrast material, repeat CT with contrast recommended. Otherwise, consider ultrasound or MRI in follow-up to exclude  liver mass or metastasis. Diffusely increased density of the gallbladder, more prominent than on prior study. This may represent milk of calcium sludge or large stones. The gallbladder wall appears thickened and cholecystitis is likely. No bile duct dilatation. Pancreas: Unenhanced appearance is unremarkable. Spleen: Unenhanced appearance is unremarkable. Adrenals/Urinary Tract: No adrenal gland nodules. Multiple poorly defined low-attenuation lesions throughout  both kidneys, largest measuring about 1.8 cm diameter. Some of these are hyperdense. These are incompletely characterized on noncontrast imaging but probably represent simple and hemorrhagic cysts. No change in appearance since prior study. Consider follow-up on MRI or contrast-enhanced CT as previously recommended. No hydronephrosis or hydroureter. Bladder is unremarkable. Stomach/Bowel: Stomach, small bowel, and colon are not abnormally distended. The colon is filled with residual contrast material. Appendix is normal. Vascular/Lymphatic: Calcification of the aorta. No aneurysm. Moderately prominent lymph nodes in the abdominal hiatus and gastrohepatic ligament. These are nonspecific but could represent metastatic disease or inflammatory nodes. Reproductive: Prostate gland is moderately enlarged. Other: No free air or free fluid in the abdomen. Abdominal wall musculature appears intact. Probable postoperative changes in the right inguinal region. Musculoskeletal: Degenerative changes in the spine. Sclerotic and lucent changes in the femoral heads bilaterally, greater on the right, likely representing avascular necrosis. IMPRESSION: 1. Small bilateral pleural effusions with basilar atelectasis, developing since prior study. 2. Indeterminate lesions in the liver and kidneys as discussed above. Consider follow-up with ultrasound, contrast-enhanced CT or MRI. 3. Increasing density of the gallbladder, likely milk of calcium or possibly large stones. Gallbladder wall thickening. Changes may represent acute cholecystitis. 4. No evidence of bowel obstruction or inflammation. Residual contrast material throughout the colon. 5. 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 6. Aortic atherosclerosis. Electronically Signed   By: Lucienne Capers M.D.   On: 06/07/2021 18:31   DG Chest 2 View  Result Date: 06/08/2021 CLINICAL DATA:  Wheezing and shortness of breath. EXAM: CHEST - 2  VIEW COMPARISON:  06/02/2021 and prior studies FINDINGS: The cardiomediastinal silhouette is unremarkable. Increasing bibasilar opacities are present which may represent atelectasis or airspace disease. A RIGHT IJ central venous catheter is again noted with tip overlying the UPPER RIGHT atrium. There is no evidence of pneumothorax or large pleural effusion. IMPRESSION: Increasing bibasilar opacities which may represent atelectasis or airspace disease. Electronically Signed   By: Margarette Canada M.D.   On: 06/08/2021 19:40   US Abdomen Limited RUQ (LIVER/GB)  Result Date: 06/08/2021 CLINICAL DATA:  Abdominal pain EXAM: ULTRASOUND ABDOMEN LIMITED RIGHT UPPER QUADRANT COMPARISON:  CT abdomen dated 06/07/2021. FINDINGS: Gallbladder: Gallbladder wall is thickened, with demonstrated measurement of 5 mm. Incidental note made of 3 mm and 4 mm polyps adherent to the gallbladder wall. 5 mm stone is seen within the gallbladder. Subtle sludge is seen within the gallbladder. No sonographic Murphy's sign elicited. Common bile duct: Diameter: 2 mm Liver: No focal lesion identified. Within normal limits in echogenicity. Portal vein is patent on color Doppler imaging with normal direction of blood flow towards the liver. Other: None. IMPRESSION: 1. Gallbladder wall is thickened, however, no sonographic Murphy's sign was elicited during the exam to confirm an acute cholecystitis. Differential includes cholecystitis (acute and chronic), CHF, hepatic cirrhosis or hepatitis, and hypoalbuminemia. Consider nuclear medicine HIDA scan for further characterization. 2. Sludge and 5 mm stone within the gallbladder. 3. No focal liver lesion is seen by ultrasound. Multiple indeterminate hypodense lesions were seen in the liver on CT abdomen of 06/07/2021. Recommend further  characterization with nonemergent liver MRI. Electronically Signed   By: Franki Cabot M.D.   On: 06/08/2021 14:07      Impression/Plan:   Rectal pain with history of  constipation history of L3-L4 discectomy On CT scan shows possible rectal impaction, constipation likely source of rectal pain Has had 2 enemas 02/04 at 1430 and this AM at 8- some blood with initial insertion of enema tube likely trauma with some stool but not as much as hoped On HC supp BID Dulcolax 10 mg daily Lactulose 20 mg BID Miralax BID Senokat 2 tablets BID S/p fecal disimpaction with Dr. Lorenso Courier. Dr. Benson [place] patient, will take over care Monday KUB came back with continuing gas and stool in colon, mild dilataion of mild small bowel loops may suggest ileus  Ileus? likely from fecal impaction -Continue to maintain magnesium above 2 and potassium at 4-4.5.  -Minimize narcotics and anticholinergic medications -Minimal N/V at this time, liquid diet, can consider NG tube placement IF worsening nausea and vomiting but with disimpaction and hopefully BM's will not be needed.  -Roll patient side-to-side Q2H-4H   ESRD on hemodialysis ([place]) AV fistula graft- due tomorrow   Dysphagia High risk for endoscopic procedures.  S/p catheretization History of smoking 30 pack year smoking history Q Apr 29 2021  Due to rectal pain, will plan for MBS with esophogram is negative   Chronic systolic heart failure Ejection fraction 35 to 40% on echocardiogram   Non-STEMI 04/29/2021 Catheterization found occluded RCA and diffuse LAD and left circumflex not amenable to intervention.  Medical managed No chest pain On Heparin   Paroxysmal atrial fibrillation (HCC) On Eliquis outpatient, on hold- on heparin   Abnormal CT scan CT scan showed increased density in gallbladder with gallbladder wall thickening. General surgery consulted However no significant abdominal pain, no right upper quadrant pain [place] with sludge, no pain, no elevated LFTs If worsening pain can consider HIDA   Multiple indeterminate hypodense lesions were seen in the liver on CT abdomen of 06/07/2021.  AST 55 ALT 32  Alkphos 90  TBili 0.9 INR 05/12/2021 1.8  PENDING MRCP PENDING AFP   Anemia HGB 9.1(9.6) MCV 95.6  Pending anemia studies   Chest wheezing expiratory bilateral On 2 L [place] CXR atelectasis No leukocytosis  Future Appointments  Date Time Provider [place]  06/11/2021  2:00 PM [facility] PA/NP [facility] None  06/21/2021 12:30 PM [facility] PA [facility]      LOS: 1 day   Ruben [name]  06/09/2021, 10:23 AM

## 2021-06-09 NOTE — Progress Notes (Signed)
ANTICOAGULATION CONSULT NOTE - Follow Up Consult  Pharmacy Consult for Heparin Indication: atrial fibrillation  Allergies  Allergen Reactions   Other Other (See Comments)    Pollen - unknown    Patient Measurements: Height: 5\' 11"  (180.3 cm) Weight: 78.9 kg (173 lb 15.1 oz) IBW/kg (Calculated) : 75.3 kg Heparin Dosing Weight: 78 kg  Vital Signs: Temp: 98.4 F (36.9 C) (02/05 1604) Temp Source: Oral (02/05 0609) BP: 94/69 (02/05 1604) Pulse Rate: 83 (02/05 1604)  Labs: Recent Labs    06/07/21 1610 06/07/21 2057 06/07/21 2315 06/08/21 1157 06/08/21 2129 06/09/21 0050 06/09/21 1549  HGB 9.4*  --   --  9.6*  --  9.1*  --   HCT 28.5*  --   --  28.6*  --  28.2*  --   PLT 177  --   --  182  --  185  --   APTT  --   --   --   --  77* 93* 112*  HEPARINUNFRC  --   --   --   --  >1.10* >1.10*  --   CREATININE 2.80*  --   --  4.91*  --  5.90*  --   TROPONINIHS  --  63* 66*  --   --   --   --      Estimated Creatinine Clearance: 11.5 mL/min (A) (by C-G formula based on SCr of 5.9 mg/dL (H)).   Medical History: Past Medical History:  Diagnosis Date   Arthritis    Chronic kidney disease, stage 3b ([place])    ESRD (end stage renal disease) ([place])    ETOH abuse    History of stress test 09/02/2008   showed inferolateral scar without ischemia   Hx of echocardiogram 09/02/2008   was essentially normal   Hyperlipidemia    Hypertension    Myocardial infarction [facility])    Tobacco abuse     Assessment: 76 yo male with a history of atrial fibrillation and CAD presents with nausea, vomiting, rectal pain, and worsening abdominal pain. PTA the patient is on apixaban, last dose on 2/3 in the evening (per the patient and family members). Pharmacy is consulted to dose heparin.  Of note, the patient had a recent admission in 04/2021 for NSTEMI, cardiogenic shock, acute systolic CHF, mitral regurgitation, and aortic valve insufficiency.   aPTT is now supra-therapeutic at 112 despite  earlier rate decrease to 650 units/hr. Possibly accumulating drug in the setting of poor renal fx. Per RN, no s/s of overt bleeding anywhere.   Goal of Therapy:  Heparin level 0.3-0.7 units/ml aPTT 66-102 seconds Monitor platelets by anticoagulation protocol: Yes   Plan:  Decrease heparin IV to 550 units/hr Obtain an 8-hour aPTT  Monitor a daily aPTT, heparin level, and CBC Monitor for signs and symptoms of bleeding F/U ability to resume apixaban PO  Albertina Parr, PharmD., BCCCP Clinical Pharmacist Please refer to [facility] for unit-specific pharmacist

## 2021-06-09 NOTE — Consult Note (Signed)
[facility] Renal Consultation Note    Indication for Consultation:  Management of ESRD/hemodialysis, anemia, hypertension/volume, and secondary hyperparathyroidism. PCP:  HPI: Ruben [name] is a 76 y.o. male with ESRD (recently started on HD last month). HTN, HL, CAD, Hx NSTEMI and acute systolic HF/cardiogenic shock, and A-fib who was admitted for rectal pain and dysphagia/vomiting.  S/p recent prolonged admit at [facility] with NSTEMI/cardiogenic shock and initiation on dialysis, was discharged on  05/27/21. Presented to ED on 1/29 with concern of rectal pain/hemorrhoids and dyspnea. Labs/imaging reassuring. He was given oral barium for constipation, decided to leave/go home despite not having BM. He had a video OV with his PCP on 1/31 for concern of rectal pain in addition to N/V. Back to ED on 2/3 with the same and was admitted. Labs on 06/08/21 with K 4.2, Ca 9, Alb 2.9, WBC 7.2, Hgb 9.6. CT abdomen 2/3 with concern for possible cholecystitis, in addition to small B pleural effusions, cysts in B kidneys/liver, B AVN femoral heads, no signs of bowel obstruction. Surgery consulted for possible cholecystitis, RUQ [place] with gall stone/sludge, but no specific infection. GI consulted for rectal pain and dysphagia, in addition to the liver lesions -> ordered for Miralax + enemas, MRI liver, and esophogram.   From a renal standpoint, he is new to dialysis but has been tolerating with the exception of the rectal pain making it hard for him to sit. He is due for dialysis tomorrow, which will be arranged. He has a TDC and a new LUE AVF (which is maturing).  Past Medical History:  Diagnosis Date   Arthritis    Chronic kidney disease, stage 3b ([place])    ESRD (end stage renal disease) ([place])    ETOH abuse    History of stress test 09/02/2008   showed inferolateral scar without ischemia   Hx of echocardiogram 09/02/2008   was essentially normal   Hyperlipidemia    Hypertension    Myocardial  infarction [facility])    Tobacco abuse    Past Surgical History:  Procedure Laterality Date   AV FISTULA PLACEMENT Left 05/27/2021   Procedure: LEFT ARTERIOVENOUS (AV) GRAFT CREATION;  Surgeon: Broadus John, MD;  Location: [facility];  Service: Vascular;  Laterality: Left;  PERIPHERAL NERVE BLOCK   BACK SURGERY     Dr Luiz Ochoa involving L3-L4 discectomy.   CARDIOVERSION N/A 05/13/2021   Procedure: CARDIOVERSION;  Surgeon: Larey Dresser, MD;  Location: [facility] ENDOSCOPY;  Service: Cardiovascular;  Laterality: N/A;   HERNIA REPAIR     IR FLUORO GUIDE CV LINE RIGHT  05/21/2021   IR US GUIDE VASC ACCESS RIGHT  05/21/2021   RIGHT/LEFT HEART CATH AND CORONARY ANGIOGRAPHY N/A 05/09/2021   Procedure: RIGHT/LEFT HEART CATH AND CORONARY ANGIOGRAPHY;  Surgeon: Larey Dresser, MD;  Location: [facility] CV LAB;  Service: Cardiovascular;  Laterality: N/A;   SPINE SURGERY     TEE WITHOUT CARDIOVERSION N/A 05/07/2021   Procedure: TRANSESOPHAGEAL ECHOCARDIOGRAM (TEE);  Surgeon: Larey Dresser, MD;  Location: [facility] ENDOSCOPY;  Service: Cardiovascular;  Laterality: N/A;   TEE WITHOUT CARDIOVERSION N/A 05/13/2021   Procedure: TRANSESOPHAGEAL ECHOCARDIOGRAM (TEE);  Surgeon: Larey Dresser, MD;  Location: [facility] ENDOSCOPY;  Service: Cardiovascular;  Laterality: N/A;   Family History  Problem Relation Age of Onset   Heart attack Brother    Diabetes Brother    Heart disease Brother    Cancer Father    Diabetes Brother    Heart disease Brother  Social History:  reports that he has quit smoking. His smoking use included cigarettes. He has a 30.00 pack-year smoking history. He has never used smokeless tobacco. He reports that he does not drink alcohol and does not use drugs.  ROS: As per HPI otherwise negative.  Physical Exam: Vitals:   06/08/21 0946 06/08/21 1540 06/08/21 1945 06/09/21 0609  BP: 116/68 117/66 118/67 106/63  Pulse: 88 90 (!) 103 (!) 102  Resp: _0 Temp: (!) 97.5 F (36.4 C) 98.7 F (37.1 C)  98.2 F (36.8 C) 98.2 F (36.8 C)  TempSrc: Oral Oral Oral Oral  SpO2: 100% 90% 92% 91%  Weight:    78.9 kg  Height:         General: Frail man, NAD. Laying on side. Head: Normocephalic, atraumatic, sclera non-icteric, mucus membranes are moist. Neck: Supple without lymphadenopathy/masses. JVD not elevated. Lungs: Clear bilaterally to auscultation without wheezes, rales, or rhonchi. Breathing is unlabored. Heart: RRR with normal S1, S2. No murmurs, rubs, or gallops appreciated. Abdomen: Soft, non-tender, non-distended with normoactive bowel sounds.  Musculoskeletal:  Strength and tone appear normal for age. Lower extremities: No edema or ischemic changes, no open wounds. Neuro: Alert and oriented X 3. Moves all extremities spontaneously. Psych:  Responds to questions appropriately with a normal affect. Dialysis Access: TDC in R chest, LUE AVF + bruit  Allergies  Allergen Reactions   Other Other (See Comments)    Pollen - unknown   Prior to Admission medications   Medication Sig Start Date End Date Taking? Authorizing Provider  acetaminophen (TYLENOL) 650 MG CR tablet Take 650 mg by mouth every 8 (eight) hours as needed for pain.    [provider]  albuterol (PROVENTIL) (2.5 MG/3ML) 0.083% nebulizer solution Take 3 mLs (2.5 mg total) by nebulization every 4 (four) hours as needed for wheezing or shortness of breath. Patient not taking: Reported on 04/29/2021 04/03/21 04/03/22  [name], FNP  amiodarone (PACERONE) 200 MG tablet Take 1 tablet (200 mg total) by mouth daily. 05/28/21   Clegg, Amy D, NP  apixaban (ELIQUIS) 5 MG TABS tablet Take 1 tablet (5 mg total) by mouth 2 (two) times daily. 05/28/21   Clegg, Amy D, NP  atorvastatin (LIPITOR) 80 MG tablet Take 1 tablet (80 mg total) by mouth daily. 05/28/21   Clegg, Amy D, NP  cholecalciferol (VITAMIN D3) 25 MCG (1000 UNIT) tablet Take 1,000 Units by mouth daily.    [provider]  diclofenac Sodium (VOLTAREN) 1  % GEL Apply 2 g topically 4 (four) times daily. Patient taking differently: Apply 2 g topically daily as needed (pain). 10/24/19   [name], FNP  lidocaine-hydrocortisone (ANAMANTLE) 3-1 % KIT Place 1 application rectally 2 (two) times daily. 06/02/21   Jeanell Sparrow, DO  Magnesium 200 MG TABS Take 1 tablet (200 mg total) by mouth daily. With evening meal Patient taking differently: Take 200 mg by mouth daily. 10/22/20   [name], FNP  midodrine (PROAMATINE) 10 MG tablet Take 1 tablet (10 mg total) by mouth 3 (three) times daily with meals. 05/28/21   Clegg, Amy D, NP  multivitamin (RENA-VIT) TABS tablet Take 1 tablet by mouth at bedtime. 05/28/21   Clegg, Amy D, NP  nitroGLYCERIN (NITROSTAT) 0.4 MG SL tablet Place 1 tablet (0.4 mg total) under the tongue every 5 (five) minutes as needed for chest pain. 05/28/21 05/28/22  Clegg, Amy D, NP  ondansetron (ZOFRAN) 4 MG tablet Take 1 tablet (  4 mg total) by mouth daily as needed for nausea or vomiting. 06/04/21 06/04/22  [name], FNP  phenylephrine (,USE FOR PREPARATION-H,) 0.25 % suppository Place 1 suppository rectally 2 (two) times daily. Patient not taking: Reported on 06/04/2021 05/30/21   [name], FNP  polyethylene glycol powder (GLYCOLAX/MIRALAX) powder Take 17 g by mouth 2 (two) times daily as needed. 03/04/16   McVey, Gelene Mink, PA-C  traMADol (ULTRAM) 50 MG tablet Take 1 tablet (50 mg total) by mouth every 6 (six) hours as needed. 06/04/21 06/04/22  [name], FNP   Current Facility-Administered Medications  Medication Dose Route Frequency Provider Last Rate Last Admin   acetaminophen (TYLENOL) tablet 650 mg  650 mg Oral Q6H PRN Norval Morton, MD       Or   acetaminophen (TYLENOL) suppository 650 mg  650 mg Rectal Q6H PRN Fuller Plan A, MD       albuterol (PROVENTIL) (2.5 MG/3ML) 0.083% nebulizer solution 2.5 mg  2.5 mg Nebulization Q6H PRN Fuller Plan A, MD       amiodarone (PACERONE) tablet 200 mg  200 mg Oral  Daily Tamala Julian, Rondell A, MD   200 mg at 06/08/21 1703   atorvastatin (LIPITOR) tablet 80 mg  80 mg Oral Daily Fuller Plan A, MD   80 mg at 06/08/21 1704   bisacodyl (DULCOLAX) suppository 10 mg  10 mg Rectal Daily Vladimir Crofts, PA-C   10 mg at 06/08/21 1705   heparin ADULT infusion 100 units/mL (25000 units/229m)  650 Units/hr Intravenous Continuous GDarlina Sicilian RPH 6.5 mL/hr at 06/09/21 0735 650 Units/hr at 06/09/21 0735   hydrocortisone (ANUSOL-HC) suppository 25 mg  25 mg Rectal BID SFuller PlanA, MD   25 mg at 06/09/21 01610  HYDROmorphone (DILAUDID) injection 0.5 mg  0.5 mg Intravenous Q3H PRN GCaren Griffins MD   0.5 mg at 06/09/21 0843   lactulose (CHRONULAC) 10 GM/15ML solution 20 g  20 g Oral BID GCaren Griffins MD       magnesium oxide (MAG-OX) tablet 200 mg  200 mg Oral Daily STamala Julian Rondell A, MD   200 mg at 06/08/21 1704   midodrine (PROAMATINE) tablet 10 mg  10 mg Oral TID WC Smith, Rondell A, MD   10 mg at 06/09/21 0843   polyethylene glycol (MIRALAX / GLYCOLAX) packet 17 g  17 g Oral BID CVladimir Crofts PA-C   17 g at 06/08/21 2044   senna-docusate (Senokot-S) tablet 2 tablet  2 tablet Oral BID GCaren Griffins MD       sodium chloride flush (NS) 0.9 % injection 3 mL  3 mL Intravenous Q12H Smith, Rondell A, MD   3 mL at 06/08/21 1551   traMADol (ULTRAM) tablet 50 mg  50 mg Oral Q6H PRN SNorval Morton MD   50 mg at 06/08/21 2150   Labs: Basic Metabolic Panel: Recent Labs  Lab 06/07/21 1610 06/08/21 1157 06/09/21 0050  NA 140 141 140  K 3.6 4.2 4.4  CL 95* 96* 95*  CO2 32 29 27  GLUCOSE 121* 123* 151*  BUN 10 25* 40*  CREATININE 2.80* 4.91* 5.90*  CALCIUM 8.3* 9.0 9.1  PHOS  --  4.5 5.8*   Liver Function Tests: Recent Labs  Lab 06/07/21 1610 06/08/21 1157 06/09/21 0050  AST 55*  --   --   ALT 32  --   --   ALKPHOS 90  --   --   BILITOT  0.9  --   --   PROT 7.1  --   --   ALBUMIN 3.3* 2.9* 3.0*   Recent Labs  Lab 06/07/21 1610   LIPASE 39   No results for input(s): AMMONIA in the last 168 hours. CBC: Recent Labs  Lab 06/07/21 1610 06/08/21 1157 06/09/21 0050  WBC 7.0 7.2 9.4  NEUTROABS 5.4  --   --   HGB 9.4* 9.6* 9.1*  HCT 28.5* 28.6* 28.2*  MCV 95.3 96.0 95.6  PLT 177 182 185   Studies/Results: CT Abdomen Pelvis Wo Contrast  Result Date: 06/07/2021 CLINICAL DATA:  Rectal pain.  Vomiting.  Dialysis today. EXAM: CT ABDOMEN AND PELVIS WITHOUT CONTRAST TECHNIQUE: Multidetector CT imaging of the abdomen and pelvis was performed following the standard protocol without IV contrast. RADIATION DOSE REDUCTION: This exam was performed according to the departmental dose-optimization program which includes automated exposure control, adjustment of the mA and/or kV according to patient size and/or use of iterative reconstruction technique. COMPARISON:  06/02/2021 FINDINGS: Lower chest: Small bilateral pleural effusions with basilar atelectasis, developing since prior study. Cardiac enlargement. Coronary artery calcifications. Hepatobiliary: Low-attenuation lesions measuring up to about 12 mm diameter demonstrated in the liver, most prominently in the left lobe. These are incompletely characterized on noncontrast imaging. If the patient is able to have contrast material, repeat CT with contrast recommended. Otherwise, consider ultrasound or MRI in follow-up to exclude liver mass or metastasis. Diffusely increased density of the gallbladder, more prominent than on prior study. This may represent milk of calcium sludge or large stones. The gallbladder wall appears thickened and cholecystitis is likely. No bile duct dilatation. Pancreas: Unenhanced appearance is unremarkable. Spleen: Unenhanced appearance is unremarkable. Adrenals/Urinary Tract: No adrenal gland nodules. Multiple poorly defined low-attenuation lesions throughout both kidneys, largest measuring about 1.8 cm diameter. Some of these are hyperdense. These are incompletely  characterized on noncontrast imaging but probably represent simple and hemorrhagic cysts. No change in appearance since prior study. Consider follow-up on MRI or contrast-enhanced CT as previously recommended. No hydronephrosis or hydroureter. Bladder is unremarkable. Stomach/Bowel: Stomach, small bowel, and colon are not abnormally distended. The colon is filled with residual contrast material. Appendix is normal. Vascular/Lymphatic: Calcification of the aorta. No aneurysm. Moderately prominent lymph nodes in the abdominal hiatus and gastrohepatic ligament. These are nonspecific but could represent metastatic disease or inflammatory nodes. Reproductive: Prostate gland is moderately enlarged. Other: No free air or free fluid in the abdomen. Abdominal wall musculature appears intact. Probable postoperative changes in the right inguinal region. Musculoskeletal: Degenerative changes in the spine. Sclerotic and lucent changes in the femoral heads bilaterally, greater on the right, likely representing avascular necrosis. IMPRESSION: 1. Small bilateral pleural effusions with basilar atelectasis, developing since prior study. 2. Indeterminate lesions in the liver and kidneys as discussed above. Consider follow-up with ultrasound, contrast-enhanced CT or MRI. 3. Increasing density of the gallbladder, likely milk of calcium or possibly large stones. Gallbladder wall thickening. Changes may represent acute cholecystitis. 4. No evidence of bowel obstruction or inflammation. Residual contrast material throughout the colon. 5. 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 6. Aortic atherosclerosis. Electronically Signed   By: Lucienne Capers M.D.   On: 06/07/2021 18:31   DG Chest 2 View  Result Date: 06/08/2021 CLINICAL DATA:  Wheezing and shortness of breath. EXAM: CHEST - 2 VIEW COMPARISON:  06/02/2021 and prior studies FINDINGS: The cardiomediastinal silhouette is unremarkable.  Increasing bibasilar opacities are present which may  represent atelectasis or airspace disease. A RIGHT IJ central venous catheter is again noted with tip overlying the UPPER RIGHT atrium. There is no evidence of pneumothorax or large pleural effusion. IMPRESSION: Increasing bibasilar opacities which may represent atelectasis or airspace disease. Electronically Signed   By: Margarette Canada M.D.   On: 06/08/2021 19:40   US Abdomen Limited RUQ (LIVER/GB)  Result Date: 06/08/2021 CLINICAL DATA:  Abdominal pain EXAM: ULTRASOUND ABDOMEN LIMITED RIGHT UPPER QUADRANT COMPARISON:  CT abdomen dated 06/07/2021. FINDINGS: Gallbladder: Gallbladder wall is thickened, with demonstrated measurement of 5 mm. Incidental note made of 3 mm and 4 mm polyps adherent to the gallbladder wall. 5 mm stone is seen within the gallbladder. Subtle sludge is seen within the gallbladder. No sonographic Murphy's sign elicited. Common bile duct: Diameter: 2 mm Liver: No focal lesion identified. Within normal limits in echogenicity. Portal vein is patent on color Doppler imaging with normal direction of blood flow towards the liver. Other: None. IMPRESSION: 1. Gallbladder wall is thickened, however, no sonographic Murphy's sign was elicited during the exam to confirm an acute cholecystitis. Differential includes cholecystitis (acute and chronic), CHF, hepatic cirrhosis or hepatitis, and hypoalbuminemia. Consider nuclear medicine HIDA scan for further characterization. 2. Sludge and 5 mm stone within the gallbladder. 3. No focal liver lesion is seen by ultrasound. Multiple indeterminate hypodense lesions were seen in the liver on CT abdomen of 06/07/2021. Recommend further characterization with nonemergent liver MRI. Electronically Signed   By: Franki Cabot M.D.   On: 06/08/2021 14:07    Dialysis Orders:  MWF at [facility] 4hr, 400/500, EDW 72kg (although leaving 74kg range), 2K/2Ca, TDC (+ maturing AVF), heparin 2000 unit bolus - No ESA or  VDRA  Assessment/Plan:  Rectal pain/?hemorrhoids: Started on bowel regimen by GI - follow.  Liver/kidney lesions: Liver MRI ordered by GI - follow. PLEASE AVOID MRI CONTRAST D/T ESRD.  Dysphagia: Esophogram ordered by GI - follow.  ESRD:  Continue HD on MWF schedule - HD tomorrow.  Hypotension/volume: Chronically low BP, on midodrine 23m TID. No edema on exam.  Anemia: Hgb 9.1 - FOBT negative. Will start ESA and order iron studies.  Metabolic bone disease: CorrCa and Phos slightly high. Need to start binder, but they can often contribute to constipation, so will plan to wait until constipation sorted.  CAD/HFrEF  A-fib: On amiodarone  KVeneta Penton PA-C 06/09/2021, 12:06 PM  [facility]

## 2021-06-09 NOTE — Progress Notes (Signed)
ANTICOAGULATION CONSULT NOTE - Follow Up Consult  Pharmacy Consult for Heparin Indication: atrial fibrillation  Allergies  Allergen Reactions   Other Other (See Comments)    Pollen - unknown    Patient Measurements: Height: 5\' 11"  (180.3 cm) Weight: 78.9 kg (173 lb 15.1 oz) IBW/kg (Calculated) : 75.3 kg Heparin Dosing Weight: 78 kg  Vital Signs: Temp: 98.2 F (36.8 C) (02/05 0609) Temp Source: Oral (02/05 0609) BP: 106/63 (02/05 0609) Pulse Rate: 102 (02/05 0609)  Labs: Recent Labs    06/07/21 1610 06/07/21 2057 06/07/21 2315 06/08/21 1157 06/08/21 2129 06/09/21 0050  HGB 9.4*  --   --  9.6*  --  9.1*  HCT 28.5*  --   --  28.6*  --  28.2*  PLT 177  --   --  182  --  185  APTT  --   --   --   --  77* 93*  HEPARINUNFRC  --   --   --   --  >1.10* >1.10*  CREATININE 2.80*  --   --  4.91*  --  5.90*  TROPONINIHS  --  63* 66*  --   --   --      Estimated Creatinine Clearance: 11.5 mL/min (A) (by C-G formula based on SCr of 5.9 mg/dL (H)).   Medical History: Past Medical History:  Diagnosis Date   Arthritis    Chronic kidney disease, stage 3b ([place])    ESRD (end stage renal disease) ([place])    ETOH abuse    History of stress test 09/02/2008   showed inferolateral scar without ischemia   Hx of echocardiogram 09/02/2008   was essentially normal   Hyperlipidemia    Hypertension    Myocardial infarction [facility])    Tobacco abuse     Assessment: 76 yo male with a history of atrial fibrillation and CAD presents with nausea, vomiting, rectal pain, and worsening abdominal pain. PTA the patient is on apixaban, last dose on 2/3 in the evening (per the patient and family members). Pharmacy is consulted to dose heparin.  Of note, the patient had a recent admission in 04/2021 for NSTEMI, cardiogenic shock, acute systolic CHF, mitral regurgitation, and aortic valve insufficiency.   aPTT is therapeutic at 93 while the heparin infusion is running at 700 units/hr. The aPTT is at  the upper limit of goal range, therefore, will decrease the rate slightly to prevent the patient from becoming supratherapeutic. Heparin level is supratherapeutic at >1.10, consistent with the effect of apixaban. Will monitor therapy with aPTT until the aPTTs begin to correlate with the heparin levels.  Hgb 9.1, platelets 185, no issues with the infusion or signs and symptoms of bleeding are documented.   Goal of Therapy:  Heparin level 0.3-0.7 units/ml aPTT 66-102 seconds Monitor platelets by anticoagulation protocol: Yes   Plan:  Decrease heparin IV to 650 units/hr Obtain an 8-hour heparin level Monitor a daily aPTT, heparin level, and CBC Monitor for signs and symptoms of bleeding F/U ability to resume apixaban PO  Shauna Hugh, PharmD, [place]  PGY-2 Pharmacy Resident 06/09/2021 7:07 AM  Please check [email or web address] for unit-specific pharmacy phone numbers.

## 2021-06-09 NOTE — Progress Notes (Signed)
PROGRESS NOTE  Ruben [name] JHE:[id number] DOB: Feb 09, 1946 DOA: 06/07/2021 PCP: [name], FNP   LOS: 1 day   Brief Narrative / Interim history:  76 y.o. male with medical history significant of hypertension, hyperlipidemia, ESRD on HD, CAD, recent hospitalization for non-STEMI and acute systolic CHF with cardiogenic shock requiring ICU stay and inotrope support, A. fib with RVR, prior alcohol abuse, and tobacco abuse who presents with complaints of rectal pain and difficulty swallowing over the last 2 weeks.  His most recent hospital stay was prolonged, he was here for almost a month.  He has declined since with weight loss, poor p.o. intake, dysphagia as well as increased constipation.  There was concern for acute cholecystitis on admission and general surgery was consulted.  For his rectal pain GI was consulted as well  Subjective / 24h Interval events: -Complains of severe rectal pain this morning.  Did not get much sleep last night  Assessment and Plan: Rectal pain and constipation- (present on admission) -patient reports complaints of constipation and rectal pain for which she has been unable to have adequate bowel movement despite taking stool softeners, sitz bath's, use of Preparation H, and suppositories. CT scan of the abdomen pelvis did not note any signs of fecal impaction or bowel obstruction, but appears to show stool throughout the colon.  GI consulted.  Continue aggressive bowel regimen, enema this morning, increase Senokot-S, add lactulose for today.  Continue hydrocortisone suppositories.  Stop morphine because of his renal failure and changed to Dilaudid for severe pain  CAD (coronary artery disease)- (present on admission) -Patient had recent NSTEMI 04/2021 for which he underwent cardiac catheterization noting RCA occlusion with diffuse disease of the LAD and left circumflex.  It was not amendable to PCI and he was not a surgical candidate for CABG. Continue  statin  Transaminitis- (present on admission) -AST was noted to be mildly elevated at 55, but lipase was noted to be within normal limits.  Possibly related with patient's prior history of abuse.  Normocytic anemia- (present on admission) -Stable.  No bleeding  ETOH abuse -Patient has not not drank alcohol since he was admitted to the hospital in December 2022. Continue to encourage abstinence from alcohol  Chronic systolic CHF (congestive heart failure) ([place])- (present on admission) -Following NSTEMI in January 2023 patient was noted to have gone into cardiogenic shock and required temporary stay in ICU requiring pressors which were able to be weaned off and he was transition to midodrine.  EF 35-40% on echocardiogram with moderate mitral regurgitation. Currently patient does not appear grossly fluid overloaded.  Continue midodrine, fluid management per dialysis  Paroxysmal atrial fibrillation ([place]) -Patient appears to be in sinus rhythm at this time and rate controlled. Held Eliquis. Heparin per pharmacy for possibility of needed procedure. Continue amiodarone 200 mg daily  Dysphagia -Patient complains of food and liquids getting stuck in his throat causing him to vomit it back up.  Denies complaints of coughing while eating.  He has a prior history of alcohol abuse.  GI consulted, check barium esophagogram tomorrow  Suspected acute cholecystitis -Patient presented with complaints of abdominal pain with nausea and vomiting on admission.  CT scan of the abdomen pelvis significant for increased density in the gallbladder concerning for milk of calcium or large gallstones with gallbladder wall thickening.  General surgery has been consulted and evaluated patient, and they do not feel that imaging and physical exam and symptoms are consistent with acute cholecystitis.  They signed off.  He was initially placed on ceftriaxone but it has been discontinued now  Weight loss -family reports 30 pound  weight loss over the last month.  Question if multifactorial in the recent NSTEMI, ESRD, and constipation with poor p.o. intake.  TSH unremarkable  ESRD on hemodialysis [facility]) -He has new end-stage renal disease following his most recent hospital stay.  Currently MWF.  Consulted nephrology, discussed with Dr. Joylene Grapes.  Just had an AV fistula placed, it has not matured and currently having dialysis via HD cath  HLD (hyperlipidemia)- (present on admission) -Continue statin   Scheduled Meds:  amiodarone  200 mg Oral Daily   atorvastatin  80 mg Oral Daily   bisacodyl  10 mg Rectal Daily   hydrocortisone  25 mg Rectal BID   lactulose  20 g Oral BID   magnesium oxide  200 mg Oral Daily   midodrine  10 mg Oral TID WC   polyethylene glycol  17 g Oral BID   senna-docusate  2 tablet Oral BID   sodium chloride flush  3 mL Intravenous Q12H   Continuous Infusions:  heparin 650 Units/hr (06/09/21 0735)   PRN Meds:.acetaminophen **OR** acetaminophen, albuterol, HYDROmorphone (DILAUDID) injection, traMADol  Diet Orders (From admission, onward)     Start     Ordered   06/09/21 0940  Diet full liquid Room service appropriate? Yes with Assist; Fluid consistency: Thin  Diet effective now       Question Answer Comment  Room service appropriate? Yes with Assist   Fluid consistency: Thin      06/09/21 0939            DVT prophylaxis:    Lab Results  Component Value Date   PLT 185 06/09/2021      Code Status: Full Code  Family Communication: Daughter was present at bedside  Status is: Inpatient  Remains inpatient appropriate because: Severity of illness   Level of care: Telemetry Medical  Consultants:  Gastroenterology General surgery Nephrology  Procedures:  none  Microbiology  none  Antimicrobials: none    Objective: Vitals:   06/08/21 0946 06/08/21 1540 06/08/21 1945 06/09/21 0609  BP: 116/68 117/66 118/67 106/63  Pulse: 88 90 (!) 103 (!) 102  Resp: 19 18 18  20   Temp: (!) 97.5 F (36.4 C) 98.7 F (37.1 C) 98.2 F (36.8 C) 98.2 F (36.8 C)  TempSrc: Oral Oral Oral Oral  SpO2: 100% 90% 92% 91%  Weight:    78.9 kg  Height:        Intake/Output Summary (Last 24 hours) at 06/09/2021 1054 Last data filed at 06/09/2021 0600 Gross per 24 hour  Intake 391.5 ml  Output --  Net 391.5 ml   Wt Readings from Last 3 Encounters:  06/09/21 78.9 kg  06/02/21 23.1 kg  05/28/21 75.1 kg    Examination:  Constitutional: Appears uncomfortable and in pain Eyes: no scleral icterus ENMT: Mucous membranes are moist.  Neck: normal, supple Respiratory: clear to auscultation bilaterally, no wheezing, no crackles. Normal respiratory effort. Cardiovascular: Regular rate and rhythm, no murmurs / rubs / gallops. No LE edema.  Musculoskeletal: no clubbing / cyanosis.  Skin: no rashes Neurologic: No focal deficits   Data Reviewed: I have independently reviewed following labs and imaging studies   CBC Recent Labs  Lab 06/07/21 1610 06/08/21 1157 06/09/21 0050  WBC 7.0 7.2 9.4  HGB 9.4* 9.6* 9.1*  HCT 28.5* 28.6* 28.2*  PLT 177 182 185  MCV 95.3 96.0 95.6  MCH 31.4 32.2 30.8  MCHC 33.0 33.6 32.3  RDW 14.7 14.6 14.6  LYMPHSABS 1.0  --   --   MONOABS 0.6  --   --   EOSABS 0.0  --   --   BASOSABS 0.0  --   --     Recent Labs  Lab 06/07/21 1610 06/08/21 1157 06/09/21 0050  NA 140 141 140  K 3.6 4.2 4.4  CL 95* 96* 95*  CO2 32 29 27  GLUCOSE 121* 123* 151*  BUN 10 25* 40*  CREATININE 2.80* 4.91* 5.90*  CALCIUM 8.3* 9.0 9.1  AST 55*  --   --   ALT 32  --   --   ALKPHOS 90  --   --   BILITOT 0.9  --   --   ALBUMIN 3.3* 2.9* 3.0*  MG 1.9  --   --   TSH  --  2.476  --     ------------------------------------------------------------------------------------------------------------------ No results for input(s): CHOL, HDL, LDLCALC, TRIG, CHOLHDL, LDLDIRECT in the last 72 hours.  Lab Results  Component Value Date   HGBA1C 5.7 (H)  04/30/2021   ------------------------------------------------------------------------------------------------------------------ Recent Labs    06/08/21 1157  TSH 2.476    Cardiac Enzymes No results for input(s): CKMB, TROPONINI, MYOGLOBIN in the last 168 hours.  Invalid input(s): CK ------------------------------------------------------------------------------------------------------------------    Component Value Date/Time   BNP 3,498.9 (H) 06/02/2021 0958    CBG: No results for input(s): GLUCAP in the last 168 hours.  Recent Results (from the past 240 hour(s))  Resp Panel by RT-PCR (Flu A&B, Covid) Nasopharyngeal Swab     Status: None   Collection Time: 06/07/21  8:40 PM   Specimen: Nasopharyngeal Swab; Nasopharyngeal(NP) swabs in vial transport medium  Result Value Ref Range Status   SARS Coronavirus 2 by RT PCR NEGATIVE NEGATIVE Final    Comment: (NOTE) SARS-CoV-2 target nucleic acids are NOT DETECTED.  The SARS-CoV-2 RNA is generally detectable in upper respiratory specimens during the acute phase of infection. The lowest concentration of SARS-CoV-2 viral copies this assay can detect is 138 copies/mL. A negative result does not preclude SARS-Cov-2 infection and should not be used as the sole basis for treatment or other patient management decisions. A negative result may occur with  improper specimen collection/handling, submission of specimen other than nasopharyngeal swab, presence of viral mutation(s) within the areas targeted by this assay, and inadequate number of viral copies(<138 copies/mL). A negative result must be combined with clinical observations, patient history, and epidemiological information. The expected result is Negative.  Fact Sheet for Patients:  EntrepreneurPulse.com.au  Fact Sheet for Healthcare Providers:  IncredibleEmployment.be  This test is no t yet approved or cleared by the [place] FDA and   has been authorized for detection and/or diagnosis of SARS-CoV-2 by FDA under an Emergency Use Authorization (EUA). This EUA will remain  in effect (meaning this test can be used) for the duration of the COVID-19 declaration under Section 564(b)(1) of the Act, 21 U.S.C.section 360bbb-3(b)(1), unless the authorization is terminated  or revoked sooner.       Influenza A by PCR NEGATIVE NEGATIVE Final   Influenza B by PCR NEGATIVE NEGATIVE Final    Comment: (NOTE) The Xpert Xpress SARS-CoV-2/FLU/RSV plus assay is intended as an aid in the diagnosis of influenza from Nasopharyngeal swab specimens and should not be used as a sole basis for treatment. Nasal washings and aspirates are unacceptable for Xpert Xpress SARS-CoV-2/FLU/RSV testing.  Fact Sheet for Patients:  EntrepreneurPulse.com.au  Fact Sheet for Healthcare Providers: IncredibleEmployment.be  This test is not yet approved or cleared by the [place] FDA and has been authorized for detection and/or diagnosis of SARS-CoV-2 by FDA under an Emergency Use Authorization (EUA). This EUA will remain in effect (meaning this test can be used) for the duration of the COVID-19 declaration under Section 564(b)(1) of the Act, 21 U.S.C. section 360bbb-3(b)(1), unless the authorization is terminated or revoked.  Performed at [facility], [address], [place], [place] [address]      Radiology Studies: DG Chest 2 View  Result Date: 06/08/2021 CLINICAL DATA:  Wheezing and shortness of breath. EXAM: CHEST - 2 VIEW COMPARISON:  06/02/2021 and prior studies FINDINGS: The cardiomediastinal silhouette is unremarkable. Increasing bibasilar opacities are present which may represent atelectasis or airspace disease. A RIGHT IJ central venous catheter is again noted with tip overlying the UPPER RIGHT atrium. There is no evidence of pneumothorax or large pleural effusion. IMPRESSION: Increasing  bibasilar opacities which may represent atelectasis or airspace disease. Electronically Signed   By: Margarette Canada M.D.   On: 06/08/2021 19:40   US Abdomen Limited RUQ (LIVER/GB)  Result Date: 06/08/2021 CLINICAL DATA:  Abdominal pain EXAM: ULTRASOUND ABDOMEN LIMITED RIGHT UPPER QUADRANT COMPARISON:  CT abdomen dated 06/07/2021. FINDINGS: Gallbladder: Gallbladder wall is thickened, with demonstrated measurement of 5 mm. Incidental note made of 3 mm and 4 mm polyps adherent to the gallbladder wall. 5 mm stone is seen within the gallbladder. Subtle sludge is seen within the gallbladder. No sonographic Murphy's sign elicited. Common bile duct: Diameter: 2 mm Liver: No focal lesion identified. Within normal limits in echogenicity. Portal vein is patent on color Doppler imaging with normal direction of blood flow towards the liver. Other: None. IMPRESSION: 1. Gallbladder wall is thickened, however, no sonographic Murphy's sign was elicited during the exam to confirm an acute cholecystitis. Differential includes cholecystitis (acute and chronic), CHF, hepatic cirrhosis or hepatitis, and hypoalbuminemia. Consider nuclear medicine HIDA scan for further characterization. 2. Sludge and 5 mm stone within the gallbladder. 3. No focal liver lesion is seen by ultrasound. Multiple indeterminate hypodense lesions were seen in the liver on CT abdomen of 06/07/2021. Recommend further characterization with nonemergent liver MRI. Electronically Signed   By: Franki Cabot M.D.   On: 06/08/2021 14:07     Marzetta Board, MD, [name] [facility]  Between 7 am - 7 pm I am available, please contact me via Amion (for emergencies) or Securechat (non urgent messages)  Between 7 pm - 7 am I am not available, please contact night coverage MD/APP via Amion

## 2021-06-09 NOTE — Hospital Course (Addendum)
76 y.o. male with medical history significant of hypertension, hyperlipidemia, ESRD on HD, CAD, recent hospitalization for non-STEMI and acute systolic CHF with cardiogenic shock requiring ICU stay and inotrope support, A. fib with RVR, prior alcohol abuse, and tobacco abuse who presents with complaints of rectal pain and difficulty swallowing over the last 2 weeks.  His most recent hospital stay was prolonged, he was here for almost a month.  He has declined since with weight loss, poor p.o. intake, dysphagia as well as increased constipation.  There was concern for acute cholecystitis on admission and general surgery was consulted.  Initial imaging showed some nonspecific liver lesions, on a noncontrast study, and also patient complained of dysphagia for which GI was consulted.

## 2021-06-10 ENCOUNTER — Inpatient Hospital Stay (HOSPITAL_COMMUNITY): Payer: Medicare Other

## 2021-06-10 DIAGNOSIS — K769 Liver disease, unspecified: Secondary | ICD-10-CM

## 2021-06-10 LAB — COMPREHENSIVE METABOLIC PANEL
ALT: 78 U/L — ABNORMAL HIGH (ref 0–44)
AST: 113 U/L — ABNORMAL HIGH (ref 15–41)
Albumin: 3.1 g/dL — ABNORMAL LOW (ref 3.5–5.0)
Alkaline Phosphatase: 126 U/L (ref 38–126)
Anion gap: 16 — ABNORMAL HIGH (ref 5–15)
BUN: 69 mg/dL — ABNORMAL HIGH (ref 8–23)
CO2: 26 mmol/L (ref 22–32)
Calcium: 9.1 mg/dL (ref 8.9–10.3)
Chloride: 92 mmol/L — ABNORMAL LOW (ref 98–111)
Creatinine, Ser: 7.63 mg/dL — ABNORMAL HIGH (ref 0.61–1.24)
GFR, Estimated: 7 mL/min — ABNORMAL LOW (ref >60)
Glucose, Bld: 132 mg/dL — ABNORMAL HIGH (ref 70–99)
Potassium: 4.8 mmol/L (ref 3.5–5.1)
Sodium: 134 mmol/L — ABNORMAL LOW (ref 135–145)
Total Bilirubin: 0.9 mg/dL (ref 0.3–1.2)
Total Protein: 6.1 g/dL — ABNORMAL LOW (ref 6.5–8.1)

## 2021-06-10 LAB — CBC
HCT: 28.4 % — ABNORMAL LOW (ref 39.0–52.0)
Hemoglobin: 9.4 g/dL — ABNORMAL LOW (ref 13.0–17.0)
MCH: 31.5 pg (ref 26.0–34.0)
MCHC: 33.1 g/dL (ref 30.0–36.0)
MCV: 95.3 fL (ref 80.0–100.0)
Platelets: 202 10*3/uL (ref 150–400)
RBC: 2.98 MIL/uL — ABNORMAL LOW (ref 4.22–5.81)
RDW: 14.9 % (ref 11.5–15.5)
WBC: 10.3 10*3/uL (ref 4.0–10.5)
nRBC: 0.4 % — ABNORMAL HIGH (ref 0.0–0.2)

## 2021-06-10 LAB — APTT
aPTT: 70 seconds — ABNORMAL HIGH (ref 24–36)
aPTT: 61 seconds — ABNORMAL HIGH (ref 24–36)

## 2021-06-10 LAB — VITAMIN B12: Vitamin B-12: 660 pg/mL (ref 180–914)

## 2021-06-10 LAB — IRON AND TIBC
Iron: 65 ug/dL (ref 45–182)
Saturation Ratios: 24 % (ref 17.9–39.5)
TIBC: 267 ug/dL (ref 250–450)
UIBC: 202 ug/dL

## 2021-06-10 LAB — FOLATE: Folate: 15.3 ng/mL (ref >5.9)

## 2021-06-10 LAB — AFP TUMOR MARKER: AFP, Serum, Tumor Marker: 5.9 ng/mL (ref 0.0–8.4)

## 2021-06-10 LAB — PHOSPHORUS: Phosphorus: 7.9 mg/dL — ABNORMAL HIGH (ref 2.5–4.6)

## 2021-06-10 LAB — FERRITIN: Ferritin: 745 ng/mL — ABNORMAL HIGH (ref 24–336)

## 2021-06-10 MED ORDER — MIDODRINE HCL 5 MG PO TABS
10.0000 mg | ORAL_TABLET | ORAL | Status: DC
  Administered 2021-06-12 – 2021-06-26 (×7): 10 mg via ORAL
  Filled 2021-06-10: qty 2

## 2021-06-10 MED ORDER — HEPARIN SODIUM (PORCINE) 1000 UNIT/ML IJ SOLN
INTRAMUSCULAR | Status: AC
  Administered 2021-06-10: 1000 [IU]
  Filled 2021-06-10: qty 4

## 2021-06-10 NOTE — TOC Progression Note (Signed)
Transition of Care [facility]) - Progression Note    Patient Details  Name: Ruben [name] MRN: [id number] Date of Birth: 03/10/1946  Transition of Care [facility]) CM/SW Contact  Jacalyn Lefevre Edson Snowball, RN Phone Number: 06/10/2021, 4:19 PM  Clinical Narrative:      Currently in hemodialysis. Received a call from [facility] with [facility], PTA patient was active with HHPT/OT, Anderson Malta requesting orders for HHRN,PT,OT,SP, aide    Transition of Care [facility]) Screening Note   Patient Details  Name: Ruben [name] Date of Birth: [birth year]    Transition of Care Department [facility]) has reviewed patient and no TOC needs have been identified at this time. We will continue to monitor patient advancement through interdisciplinary progression rounds. If new patient transition needs arise, please place a TOC consult.        Expected Discharge Plan and Services                                                 Social Determinants of Health (SDOH) Interventions    Readmission Risk Interventions No flowsheet data found.

## 2021-06-10 NOTE — Progress Notes (Signed)
Subjective: Patient seen and examined in dialysis. He denies having any dysphagia; he claims he has nausea with the smell of certain foods causes him to have nausea and vomiting. He feels the "food never hangs up in the esophagus". MBS done today did not reveal any oropharyngeal diffuclties. He has lost about about 20 lbs over the last 2 years.His bowel frequency has improved with the bowel regimen prescribed for him after the fecal disimpaction; the rectal pain has improved as well. Abdominal ultrasound revealed thickened gallbladder wall with sludge and a 5 mm stone within the gallbladder. Multiple low density lesions noted in the liver on a non-contrasted CT.   Objective: Vital signs in last 24 hours: Temp:  [98.4 F (36.9 C)-98.9 F (37.2 C)] 98.4 F (36.9 C) (02/06 0640) Pulse Rate:  [80-89] 80 (02/06 0640) Resp:  [18-22] 18 (02/06 0640) BP: (94-113)/(61-69) 113/61 (02/06 0640) SpO2:  [94 %-100 %] 100 % (02/06 0640) Last BM Date: 06/10/21  Intake/Output from previous day: 02/05 0701 - 02/06 0700 In: 921.9 [P.O.:860; I.V.:61.9] Out: -  Intake/Output this shift: No intake/output data recorded.  General appearance: alert, cooperative, appears stated age, fatigued, and no distress Resp: clear to auscultation bilaterally Cardio: regular rate and rhythm, S1, S2 normal, no murmur, click, rub or gallop GI: soft, non-tender; bowel sounds normal; no masses,  no organomegaly  Lab Results: Recent Labs    06/08/21 1157 06/09/21 0050 06/10/21 0203  WBC 7.2 9.4 10.3  HGB 9.6* 9.1* 9.4*  HCT 28.6* 28.2* 28.4*  PLT 182 185 202   BMET Recent Labs    06/08/21 1157 06/09/21 0050 06/10/21 0203  NA 141 140 134*  K 4.2 4.4 4.8  CL 96* 95* 92*  CO2 _0 GLUCOSE 123* 151* 132*  BUN 25* 40* 69*  CREATININE 4.91* 5.90* 7.63*  CALCIUM 9.0 9.1 9.1   LFT Recent Labs    06/10/21 0203  PROT 6.1*  ALBUMIN 3.1*  AST 113*  ALT 78*  ALKPHOS 126  BILITOT 0.9    Studies/Results: DG Chest 2 View  Result Date: 06/08/2021 CLINICAL DATA:  Wheezing and shortness of breath. EXAM: CHEST - 2 VIEW COMPARISON:  06/02/2021 and prior studies FINDINGS: The cardiomediastinal silhouette is unremarkable. Increasing bibasilar opacities are present which may represent atelectasis or airspace disease. A RIGHT IJ central venous catheter is again noted with tip overlying the UPPER RIGHT atrium. There is no evidence of pneumothorax or large pleural effusion. IMPRESSION: Increasing bibasilar opacities which may represent atelectasis or airspace disease. Electronically Signed   By: Margarette Canada M.D.   On: 06/08/2021 19:40   DG Abd 1 View  Result Date: 06/09/2021 CLINICAL DATA:  Abdominal pain, vomiting EXAM: ABDOMEN - 1 VIEW COMPARISON:  None. FINDINGS: There is mild dilation of small-bowel loops. Gas and stool are present in colon. Stomach is not distended. No abnormal masses or calcifications are seen. Sclerosis seen in the head of the right femur suggests possible avascular necrosis. IMPRESSION: Presence of gas in slightly dilated small bowel loops may suggest ileus. Possible avascular necrosis in the head of the right femur. Electronically Signed   By: Elmer Picker M.D.   On: 06/09/2021 12:31   US Abdomen Limited RUQ (LIVER/GB)  Result Date: 06/08/2021 CLINICAL DATA:  Abdominal pain EXAM: ULTRASOUND ABDOMEN LIMITED RIGHT UPPER QUADRANT COMPARISON:  CT abdomen dated 06/07/2021. FINDINGS: Gallbladder: Gallbladder wall is thickened, with demonstrated measurement of 5 mm. Incidental note made of 3 mm and 4 mm polyps adherent  to the gallbladder wall. 5 mm stone is seen within the gallbladder. Subtle sludge is seen within the gallbladder. No sonographic Murphy's sign elicited. Common bile duct: Diameter: 2 mm Liver: No focal lesion identified. Within normal limits in echogenicity. Portal vein is patent on color Doppler imaging with normal direction of blood flow towards the liver.  Other: None. IMPRESSION: 1. Gallbladder wall is thickened, however, no sonographic Murphy's sign was elicited during the exam to confirm an acute cholecystitis. Differential includes cholecystitis (acute and chronic), CHF, hepatic cirrhosis or hepatitis, and hypoalbuminemia. Consider nuclear medicine HIDA scan for further characterization. 2. Sludge and 5 mm stone within the gallbladder. 3. No focal liver lesion is seen by ultrasound. Multiple indeterminate hypodense lesions were seen in the liver on CT abdomen of 06/07/2021. Recommend further characterization with nonemergent liver MRI. Electronically Signed   By: Franki Cabot M.D.   On: 06/08/2021 14:07    Medications: I have reviewed the patient's current medications. Prior to Admission:  Medications Prior to Admission  Medication Sig Dispense Refill Last Dose   acetaminophen (TYLENOL) 650 MG CR tablet Take 650 mg by mouth every 8 (eight) hours as needed for pain.   Past Week   albuterol (PROVENTIL) (2.5 MG/3ML) 0.083% nebulizer solution Take 3 mLs (2.5 mg total) by nebulization every 4 (four) hours as needed for wheezing or shortness of breath. 75 mL 2 unk   amiodarone (PACERONE) 200 MG tablet Take 1 tablet (200 mg total) by mouth daily. 30 tablet 6 Past Week   apixaban (ELIQUIS) 5 MG TABS tablet Take 1 tablet (5 mg total) by mouth 2 (two) times daily. 60 tablet 6 Past Week   atorvastatin (LIPITOR) 80 MG tablet Take 1 tablet (80 mg total) by mouth daily. 30 tablet 6 Past Week   cholecalciferol (VITAMIN D3) 25 MCG (1000 UNIT) tablet Take 1,000 Units by mouth daily.   Past Week   Lidocaine-Hydrocortisone Ace 3-2.5 % KIT Place 1 application rectally 2 (two) times daily.   Past Week   Magnesium 200 MG TABS Take 1 tablet (200 mg total) by mouth daily. With evening meal (Patient taking differently: Take 200 mg by mouth daily.) 30 tablet 2 Past Week   midodrine (PROAMATINE) 10 MG tablet Take 1 tablet (10 mg total) by mouth 3 (three) times daily with  meals. 90 tablet 6 Past Week   multivitamin (RENA-VIT) TABS tablet Take 1 tablet by mouth at bedtime. 30 tablet 6 Past Week   nitroGLYCERIN (NITROSTAT) 0.4 MG SL tablet Place 1 tablet (0.4 mg total) under the tongue every 5 (five) minutes as needed for chest pain. 100 tablet 3 unk   ondansetron (ZOFRAN) 4 MG tablet Take 1 tablet (4 mg total) by mouth daily as needed for nausea or vomiting. 30 tablet 1 Past Week   polyethylene glycol powder (GLYCOLAX/MIRALAX) powder Take 17 g by mouth 2 (two) times daily as needed. (Patient taking differently: Take 17 g by mouth 2 (two) times daily as needed for mild constipation.) 578 g 1 Past Week   traMADol (ULTRAM) 50 MG tablet Take 1 tablet (50 mg total) by mouth every 6 (six) hours as needed. (Patient taking differently: Take 50 mg by mouth every 6 (six) hours as needed for moderate pain.) 20 tablet 0 Past Week   lidocaine-hydrocortisone (ANAMANTLE) 3-1 % KIT Place 1 application rectally 2 (two) times daily. (Patient not taking: Reported on 06/10/2021) 1 kit 0 Not Taking   phenylephrine (,USE FOR PREPARATION-H,) 0.25 % suppository Place 1 suppository  rectally 2 (two) times daily. (Patient not taking: Reported on 06/10/2021) 12 suppository 0 Not Taking   Scheduled:  amiodarone  200 mg Oral Daily   atorvastatin  80 mg Oral Daily   bisacodyl  10 mg Rectal Daily   Chlorhexidine Gluconate Cloth  6 each Topical Q0600   darbepoetin (ARANESP) injection - DIALYSIS  60 mcg Intravenous Q Mon-HD   hydrocortisone  25 mg Rectal BID   magnesium oxide  200 mg Oral Daily   midodrine  10 mg Oral TID WC   midodrine  10 mg Oral Q M,W,F-HD   polyethylene glycol  17 g Oral BID   senna-docusate  2 tablet Oral BID   sodium chloride flush  3 mL Intravenous Q12H   Assessment/Plan: 1) Rectal pain/constipation-improved with aggressive bowel regimen.  2) Gallstones with sludge and wall thickening-no evidence of acute cholecystitis. 3) Abnormal weight loss/multiple low density lesions  in the liver-check AFP. He will need further imaging of the liver. Will discuss with radiology. 4) Chronic systolic CHF-s/p NSTEMI 0/9628. 5) History of paroxysmal atrial fibrillation.  6) New ESRD on HD. 7) Hyperlipidemia. 8) History of tobacco & alcohol abuse.   LOS: 2 days   Juanita Craver 06/10/2021, 6:44 AM

## 2021-06-10 NOTE — Progress Notes (Signed)
ANTICOAGULATION CONSULT NOTE  Pharmacy Consult for Heparin Indication: atrial fibrillation  Allergies  Allergen Reactions   Other Other (See Comments)    Pollen - sneezing, itching/watery eyes    Patient Measurements: Height: 5\' 11"  (180.3 cm) Weight: 78.9 kg (173 lb 15.1 oz) IBW/kg (Calculated) : 75.3 kg Heparin Dosing Weight: 78 kg  Vital Signs: Temp: 98 F (36.7 C) (02/06 0730) Temp Source: Oral (02/06 0730) BP: 105/58 (02/06 0730) Pulse Rate: 77 (02/06 0730)  Labs: Recent Labs    06/07/21 2057 06/07/21 2315 06/08/21 1157 06/08/21 1157 06/08/21 2129 06/09/21 0050 06/09/21 1549 06/10/21 0203 06/10/21 0956  HGB  --   --  9.6*  --   --  9.1*  --  9.4*  --   HCT  --   --  28.6*  --   --  28.2*  --  28.4*  --   PLT  --   --  182  --   --  185  --  202  --   APTT  --   --   --    < > 77* 93* 112* 70* 61*  HEPARINUNFRC  --   --   --   --  >1.10* >1.10*  --   --   --   CREATININE  --   --  4.91*  --   --  5.90*  --  7.63*  --   TROPONINIHS 63* 66*  --   --   --   --   --   --   --    < > = values in this interval not displayed.     Estimated Creatinine Clearance: 8.9 mL/min (A) (by C-G formula based on SCr of 7.63 mg/dL (H)).  Assessment: 76 yo male with a history of atrial fibrillation and CAD presents with nausea, vomiting, rectal pain, and worsening abdominal pain. PTA the patient is on apixaban, last dose on 2/3 in the evening (per the patient and family members). Pharmacy is consulted to dose heparin.  Of note, the patient had a recent admission in 04/2021 for NSTEMI, cardiogenic shock, acute systolic CHF, mitral regurgitation, and aortic valve insufficiency.   aPTT slightly sub-therapeutic on 550 units/hr; previously supra-therapeutic on 650 units/hr.  No issue with heparin infusion nor bleeding per discussion with patient's family.  Goal of Therapy:  Heparin level 0.3-0.7 units/ml aPTT 66-102 seconds Monitor platelets by anticoagulation protocol: Yes    Plan:  Increase heparin gtt to 600 units/hr Daily heparin level, aPTT and CBC   Rabon Scholle D. Mina Marble, PharmD, BCPS, [place] 06/10/2021, 12:27 PM

## 2021-06-10 NOTE — Progress Notes (Signed)
Modified Barium Swallow Progress Note  Patient Details  Name: Ruben [name] MRN: [id number] Date of Birth: 10/29/1945  Today's Date: 06/10/2021  Modified Barium Swallow completed.  Full report located under Chart Review in the Imaging Section.  Brief recommendations include the following:  Clinical Impression  Pt's oropharyngeal swallowing is WFL across consistencies tested. He did have frequent gagging and dry heaving throughout testing, but no oropharyngeal difficulties that would account for his recent symptoms. From an oropharyngeal standpoint, he could advance to regular solids and thin liquids once medically able to do so. SLP to sign off acutely.   Swallow Evaluation Recommendations       SLP Diet Recommendations: Regular solids;Thin liquid (per MD discretion)   Liquid Administration via: Cup;Straw   Medication Administration: Whole meds with liquid   Supervision: Patient able to self feed   Compensations: Slow rate;Small sips/bites   Postural Changes: Seated upright at 90 degrees;Remain semi-upright after after feeds/meals (Comment)   Oral Care Recommendations: Oral care BID        Osie Bond., M.A. [facility] Pager [phone] Office [phone]  06/10/2021,12:53 PM

## 2021-06-10 NOTE — Plan of Care (Signed)

## 2021-06-10 NOTE — Progress Notes (Signed)
PROGRESS NOTE  Ruben [name] ION:[id number] DOB: [birth year] DOA: 06/07/2021 PCP: [name], FNP   LOS: 2 days   Brief Narrative / Interim history:  76 y.o. male with medical history significant of hypertension, hyperlipidemia, ESRD on HD, CAD, recent hospitalization for non-STEMI and acute systolic CHF with cardiogenic shock requiring ICU stay and inotrope support, A. fib with RVR, prior alcohol abuse, and tobacco abuse who presents with complaints of rectal pain and difficulty swallowing over the last 2 weeks.  His most recent hospital stay was prolonged, he was here for almost a month.  He has declined since with weight loss, poor p.o. intake, dysphagia as well as increased constipation.  There was concern for acute cholecystitis on admission and general surgery was consulted.  For his rectal pain GI was consulted as well  Subjective / 24h Interval events: -Rectal pain much improved today.  He is able to eat a little bit now  Assessment and Plan: Rectal pain and constipation- (present on admission) -patient reports complaints of constipation and rectal pain for which she has been unable to have adequate bowel movement despite taking stool softeners, sitz bath's, use of Preparation H, and suppositories. CT scan of the abdomen pelvis did not note any signs of fecal impaction or bowel obstruction, but appears to show stool throughout the colon.  GI consulted, status post manual disimpaction.  Continue aggressive bowel regimen, significantly improved today, has had multiple bowel movements overnight  Liver lesions - CT scan on admission showed low-attenuation lesions measuring about 12 mm diameter in the liver.  Defer to GI, since he is ESRD could potentially use a CT scan with contrast  CAD (coronary artery disease)- (present on admission) -Patient had recent NSTEMI 04/2021 for which he underwent cardiac catheterization noting RCA occlusion with diffuse disease of the LAD and left  circumflex.  It was not amendable to PCI and he was not a surgical candidate for CABG. Continue statin  Transaminitis- (present on admission) -AST was noted to be mildly elevated at 55, but lipase was noted to be within normal limits.  Possibly related with patient's prior history of abuse.  Normocytic anemia- (present on admission) -Stable.  No bleeding  ETOH abuse -Patient has not not drank alcohol since he was admitted to the hospital in December 2022. Continue to encourage abstinence from alcohol  Chronic systolic CHF (congestive heart failure) ([place])- (present on admission) -Following NSTEMI in January 2023 patient was noted to have gone into cardiogenic shock and required temporary stay in ICU requiring pressors which were able to be weaned off and he was transition to midodrine.  EF 35-40% on echocardiogram with moderate mitral regurgitation. Currently patient does not appear grossly fluid overloaded.  Continue midodrine, fluid management per dialysis  Paroxysmal atrial fibrillation ([place]) -Patient appears to be in sinus rhythm at this time and rate controlled. Held Eliquis. Heparin per pharmacy for possibility of needed procedure. Continue amiodarone 200 mg daily  Dysphagia -Patient complains of food and liquids getting stuck in his throat causing him to vomit it back up.  Denies complaints of coughing while eating.  He has a prior history of alcohol abuse.  GI consulted, check barium esophagogram today  Suspected acute cholecystitis -Patient presented with complaints of abdominal pain with nausea and vomiting on admission.  CT scan of the abdomen pelvis significant for increased density in the gallbladder concerning for milk of calcium or large gallstones with gallbladder wall thickening.  General surgery has been consulted and evaluated patient, and  they do not feel that imaging and physical exam and symptoms are consistent with acute cholecystitis.  They signed off.  He was initially  placed on ceftriaxone but it has been discontinued now  Weight loss -family reports 30 pound weight loss over the last month.  Question if multifactorial in the recent NSTEMI, ESRD, and constipation with poor p.o. intake.  TSH unremarkable  ESRD on hemodialysis [facility]) -He has new end-stage renal disease following his most recent hospital stay.  Currently MWF.  Just had an AV fistula placed, it has not matured and currently having dialysis via HD cath.  Dialysis today  HLD (hyperlipidemia)- (present on admission) -Continue statin   Scheduled Meds:  amiodarone  200 mg Oral Daily   atorvastatin  80 mg Oral Daily   bisacodyl  10 mg Rectal Daily   Chlorhexidine Gluconate Cloth  6 each Topical Q0600   darbepoetin (ARANESP) injection - DIALYSIS  60 mcg Intravenous Q Mon-HD   hydrocortisone  25 mg Rectal BID   magnesium oxide  200 mg Oral Daily   midodrine  10 mg Oral TID WC   polyethylene glycol  17 g Oral BID   senna-docusate  2 tablet Oral BID   sodium chloride flush  3 mL Intravenous Q12H   Continuous Infusions:  heparin 550 Units/hr (06/10/21 0349)   PRN Meds:.acetaminophen **OR** acetaminophen, albuterol, HYDROmorphone (DILAUDID) injection, ondansetron (ZOFRAN) IV, traMADol  Diet Orders (From admission, onward)     Start     Ordered   06/09/21 0940  Diet full liquid Room service appropriate? Yes with Assist; Fluid consistency: Thin  Diet effective now       Question Answer Comment  Room service appropriate? Yes with Assist   Fluid consistency: Thin      06/09/21 0939            DVT prophylaxis:    Lab Results  Component Value Date   PLT 202 06/10/2021      Code Status: Full Code  Family Communication: Daughter was present at bedside  Status is: Inpatient  Remains inpatient appropriate because: Severity of illness   Level of care: Telemetry Medical  Consultants:  Gastroenterology General surgery Nephrology  Procedures:  none  Microbiology   none  Antimicrobials: none    Objective: Vitals:   06/09/21 1604 06/09/21 2022 06/10/21 0640 06/10/21 0730  BP: 94/69 105/62 113/61 (!) 105/58  Pulse: 83 89 80 77  Resp: 18 (!) 22 18 16   Temp: 98.4 F (36.9 C) 98.9 F (37.2 C) 98.4 F (36.9 C) 98 F (36.7 C)  TempSrc:  Oral Oral Oral  SpO2: 94% 97% 100% 98%  Weight:      Height:        Intake/Output Summary (Last 24 hours) at 06/10/2021 1138 Last data filed at 06/10/2021 0900 Gross per 24 hour  Intake 741.93 ml  Output --  Net 741.93 ml    Wt Readings from Last 3 Encounters:  06/09/21 78.9 kg  06/02/21 23.1 kg  05/28/21 75.1 kg    Examination:  Constitutional: NAD Eyes: lids and conjunctivae normal, no scleral icterus ENMT: mmm Neck: normal, supple Respiratory: clear to auscultation bilaterally, no wheezing, no crackles. Normal respiratory effort.  Cardiovascular: Regular rate and rhythm, no murmurs / rubs / gallops. No LE edema. Abdomen: soft, no distention, no tenderness. Bowel sounds positive.  Skin: no rashes Neurologic: no focal deficits, equal strength   Data Reviewed: I have independently reviewed following labs and imaging studies   CBC  Recent Labs  Lab 06/07/21 1610 06/08/21 1157 06/09/21 0050 06/10/21 0203  WBC 7.0 7.2 9.4 10.3  HGB 9.4* 9.6* 9.1* 9.4*  HCT 28.5* 28.6* 28.2* 28.4*  PLT 177 182 185 202  MCV 95.3 96.0 95.6 95.3  MCH 31.4 32.2 30.8 31.5  MCHC 33.0 33.6 32.3 33.1  RDW 14.7 14.6 14.6 14.9  LYMPHSABS 1.0  --   --   --   MONOABS 0.6  --   --   --   EOSABS 0.0  --   --   --   BASOSABS 0.0  --   --   --      Recent Labs  Lab 06/07/21 1610 06/08/21 1157 06/09/21 0050 06/10/21 0203  NA 140 141 140 134*  K 3.6 4.2 4.4 4.8  CL 95* 96* 95* 92*  CO2 32 29 27 26   GLUCOSE 121* 123* 151* 132*  BUN 10 25* 40* 69*  CREATININE 2.80* 4.91* 5.90* 7.63*  CALCIUM 8.3* 9.0 9.1 9.1  AST 55*  --   --  113*  ALT 32  --   --  78*  ALKPHOS 90  --   --  126  BILITOT 0.9  --   --  0.9   ALBUMIN 3.3* 2.9* 3.0* 3.1*  MG 1.9  --   --   --   TSH  --  2.476  --   --      ------------------------------------------------------------------------------------------------------------------ No results for input(s): CHOL, HDL, LDLCALC, TRIG, CHOLHDL, LDLDIRECT in the last 72 hours.  Lab Results  Component Value Date   HGBA1C 5.7 (H) 04/30/2021   ------------------------------------------------------------------------------------------------------------------ Recent Labs    06/08/21 1157  TSH 2.476     Cardiac Enzymes No results for input(s): CKMB, TROPONINI, MYOGLOBIN in the last 168 hours.  Invalid input(s): CK ------------------------------------------------------------------------------------------------------------------    Component Value Date/Time   BNP 3,498.9 (H) 06/02/2021 0958    CBG: No results for input(s): GLUCAP in the last 168 hours.  Recent Results (from the past 240 hour(s))  Resp Panel by RT-PCR (Flu A&B, Covid) Nasopharyngeal Swab     Status: None   Collection Time: 06/07/21  8:40 PM   Specimen: Nasopharyngeal Swab; Nasopharyngeal(NP) swabs in vial transport medium  Result Value Ref Range Status   SARS Coronavirus 2 by RT PCR NEGATIVE NEGATIVE Final    Comment: (NOTE) SARS-CoV-2 target nucleic acids are NOT DETECTED.  The SARS-CoV-2 RNA is generally detectable in upper respiratory specimens during the acute phase of infection. The lowest concentration of SARS-CoV-2 viral copies this assay can detect is 138 copies/mL. A negative result does not preclude SARS-Cov-2 infection and should not be used as the sole basis for treatment or other patient management decisions. A negative result may occur with  improper specimen collection/handling, submission of specimen other than nasopharyngeal swab, presence of viral mutation(s) within the areas targeted by this assay, and inadequate number of viral copies(<138 copies/mL). A negative result must be  combined with clinical observations, patient history, and epidemiological information. The expected result is Negative.  Fact Sheet for Patients:  EntrepreneurPulse.com.au  Fact Sheet for Healthcare Providers:  IncredibleEmployment.be  This test is no t yet approved or cleared by the [place] FDA and  has been authorized for detection and/or diagnosis of SARS-CoV-2 by FDA under an Emergency Use Authorization (EUA). This EUA will remain  in effect (meaning this test can be used) for the duration of the COVID-19 declaration under Section 564(b)(1) of the Act, 21 U.S.C.section 360bbb-3(b)(1), unless  the authorization is terminated  or revoked sooner.       Influenza A by PCR NEGATIVE NEGATIVE Final   Influenza B by PCR NEGATIVE NEGATIVE Final    Comment: (NOTE) The Xpert Xpress SARS-CoV-2/FLU/RSV plus assay is intended as an aid in the diagnosis of influenza from Nasopharyngeal swab specimens and should not be used as a sole basis for treatment. Nasal washings and aspirates are unacceptable for Xpert Xpress SARS-CoV-2/FLU/RSV testing.  Fact Sheet for Patients: EntrepreneurPulse.com.au  Fact Sheet for Healthcare Providers: IncredibleEmployment.be  This test is not yet approved or cleared by the [place] FDA and has been authorized for detection and/or diagnosis of SARS-CoV-2 by FDA under an Emergency Use Authorization (EUA). This EUA will remain in effect (meaning this test can be used) for the duration of the COVID-19 declaration under Section 564(b)(1) of the Act, 21 U.S.C. section 360bbb-3(b)(1), unless the authorization is terminated or revoked.  Performed at [facility], [address], [place], [place] [address]      Radiology Studies: No results found.   Marzetta Board, MD, [name] [facility]  Between 7 am - 7 pm I am available, please contact me via Amion (for  emergencies) or Securechat (non urgent messages)  Between 7 pm - 7 am I am not available, please contact night coverage MD/APP via Amion

## 2021-06-10 NOTE — Progress Notes (Signed)
ANTICOAGULATION CONSULT NOTE - Follow Up Consult  Pharmacy Consult for Heparin Indication: atrial fibrillation  Allergies  Allergen Reactions   Other Other (See Comments)    Pollen - unknown    Patient Measurements: Height: 5\' 11"  (180.3 cm) Weight: 78.9 kg (173 lb 15.1 oz) IBW/kg (Calculated) : 75.3 kg Heparin Dosing Weight: 78 kg  Vital Signs: Temp: 98.9 F (37.2 C) (02/05 2022) Temp Source: Oral (02/05 2022) BP: 105/62 (02/05 2022) Pulse Rate: 89 (02/05 2022)  Labs: Recent Labs    06/07/21 1610 06/07/21 2057 06/07/21 2315 06/08/21 1157 06/08/21 1157 06/08/21 2129 06/09/21 0050 06/09/21 1549 06/10/21 0203  HGB 9.4*  --   --  9.6*  --   --  9.1*  --  9.4*  HCT 28.5*  --   --  28.6*  --   --  28.2*  --  28.4*  PLT 177  --   --  182  --   --  185  --  202  APTT  --   --   --   --    < > 77* 93* 112* 70*  HEPARINUNFRC  --   --   --   --   --  >1.10* >1.10*  --   --   CREATININE 2.80*  --   --  4.91*  --   --  5.90*  --   --   TROPONINIHS  --  63* 66*  --   --   --   --   --   --    < > = values in this interval not displayed.     Estimated Creatinine Clearance: 11.5 mL/min (A) (by C-G formula based on SCr of 5.9 mg/dL (H)).   Medical History: Past Medical History:  Diagnosis Date   Arthritis    Chronic kidney disease, stage 3b ([place])    ESRD (end stage renal disease) ([place])    ETOH abuse    History of stress test 09/02/2008   showed inferolateral scar without ischemia   Hx of echocardiogram 09/02/2008   was essentially normal   Hyperlipidemia    Hypertension    Myocardial infarction [facility])    Tobacco abuse     Assessment: 76 yo male with a history of atrial fibrillation and CAD presents with nausea, vomiting, rectal pain, and worsening abdominal pain. PTA the patient is on apixaban, last dose on 2/3 in the evening (per the patient and family members). Pharmacy is consulted to dose heparin.  Of note, the patient had a recent admission in 04/2021 for  NSTEMI, cardiogenic shock, acute systolic CHF, mitral regurgitation, and aortic valve insufficiency.   PTT now therapeutic   Goal of Therapy:  Heparin level 0.3-0.7 units/ml aPTT 66-102 seconds Monitor platelets by anticoagulation protocol: Yes   Plan:  Continue heparin at 550 units / hr Monitor a daily aPTT, heparin level, and CBC Monitor for signs and symptoms of bleeding F/U ability to resume apixaban PO  Thank you Anette Guarneri, PharmD Please refer to [facility] for unit-specific pharmacist

## 2021-06-10 NOTE — Assessment & Plan Note (Addendum)
-   CT scan on admission showed low-attenuation lesions measuring about 12 mm diameter in the liver.  Further imaging with CT scan with IV contrast as well as MRI showing fairly benign lesions

## 2021-06-10 NOTE — Progress Notes (Signed)
[facility] Progress Note   Subjective:   Patient seen and examined at bedside.  Reports weakness, fatigue and 9/10 rectal pain.  Denies CP, SOB, abdominal pain and n/v/d.  Wife reports he had regular BM throughout the night, she feels like he is doing a little better.  Reports no issues with dialysis.   Objective Vitals:   06/09/21 1604 06/09/21 2022 06/10/21 0640 06/10/21 0730  BP: 94/69 105/62 113/61 (!) 105/58  Pulse: 83 89 80 77  Resp: 18 (!) 22 18 16   Temp: 98.4 F (36.9 C) 98.9 F (37.2 C) 98.4 F (36.9 C) 98 F (36.7 C)  TempSrc:  Oral Oral Oral  SpO2: 94% 97% 100% 98%  Weight:      Height:       Physical Exam General:chronically ill appearing male in NAD Heart:RRR, no mrg Lungs:CTAB, nml WOB on RA Abdomen:soft, NTND Extremities:no LE edema Dialysis Access: LU AVF maturing +b/t, [facility]    Filed Weights   06/07/21 1806 06/09/21 0609  Weight: 78 kg 78.9 kg    Intake/Output Summary (Last 24 hours) at 06/10/2021 1120 Last data filed at 06/10/2021 0900 Gross per 24 hour  Intake 921.93 ml  Output --  Net 921.93 ml    Additional Objective Labs: Basic Metabolic Panel: Recent Labs  Lab 06/08/21 1157 06/09/21 0050 06/10/21 0203  NA 141 140 134*  K 4.2 4.4 4.8  CL 96* 95* 92*  CO2 29 27 26   GLUCOSE 123* 151* 132*  BUN 25* 40* 69*  CREATININE 4.91* 5.90* 7.63*  CALCIUM 9.0 9.1 9.1  PHOS 4.5 5.8* 7.9*   Liver Function Tests: Recent Labs  Lab 06/07/21 1610 06/08/21 1157 06/09/21 0050 06/10/21 0203  AST 55*  --   --  113*  ALT 32  --   --  78*  ALKPHOS 90  --   --  126  BILITOT 0.9  --   --  0.9  PROT 7.1  --   --  6.1*  ALBUMIN 3.3* 2.9* 3.0* 3.1*   Recent Labs  Lab 06/07/21 1610  LIPASE 39   CBC: Recent Labs  Lab 06/07/21 1610 06/08/21 1157 06/09/21 0050 06/10/21 0203  WBC 7.0 7.2 9.4 10.3  NEUTROABS 5.4  --   --   --   HGB 9.4* 9.6* 9.1* 9.4*  HCT 28.5* 28.6* 28.2* 28.4*  MCV 95.3 96.0 95.6 95.3  PLT 177 182 185 202    Recent Labs    06/10/21 0203  IRON 65  TIBC 267  FERRITIN 745*   Lab Results  Component Value Date   INR 1.8 (H) 05/12/2021   INR 1.1 04/29/2021   Studies/Results: DG Chest 2 View  Result Date: 06/08/2021 CLINICAL DATA:  Wheezing and shortness of breath. EXAM: CHEST - 2 VIEW COMPARISON:  06/02/2021 and prior studies FINDINGS: The cardiomediastinal silhouette is unremarkable. Increasing bibasilar opacities are present which may represent atelectasis or airspace disease. A RIGHT IJ central venous catheter is again noted with tip overlying the UPPER RIGHT atrium. There is no evidence of pneumothorax or large pleural effusion. IMPRESSION: Increasing bibasilar opacities which may represent atelectasis or airspace disease. Electronically Signed   By: Margarette Canada M.D.   On: 06/08/2021 19:40   DG Abd 1 View  Result Date: 06/09/2021 CLINICAL DATA:  Abdominal pain, vomiting EXAM: ABDOMEN - 1 VIEW COMPARISON:  None. FINDINGS: There is mild dilation of small-bowel loops. Gas and stool are present in colon. Stomach is not distended. No abnormal masses  or calcifications are seen. Sclerosis seen in the head of the right femur suggests possible avascular necrosis. IMPRESSION: Presence of gas in slightly dilated small bowel loops may suggest ileus. Possible avascular necrosis in the head of the right femur. Electronically Signed   By: Elmer Picker M.D.   On: 06/09/2021 12:31   US Abdomen Limited RUQ (LIVER/GB)  Result Date: 06/08/2021 CLINICAL DATA:  Abdominal pain EXAM: ULTRASOUND ABDOMEN LIMITED RIGHT UPPER QUADRANT COMPARISON:  CT abdomen dated 06/07/2021. FINDINGS: Gallbladder: Gallbladder wall is thickened, with demonstrated measurement of 5 mm. Incidental note made of 3 mm and 4 mm polyps adherent to the gallbladder wall. 5 mm stone is seen within the gallbladder. Subtle sludge is seen within the gallbladder. No sonographic Murphy's sign elicited. Common bile duct: Diameter: 2 mm Liver: No focal  lesion identified. Within normal limits in echogenicity. Portal vein is patent on color Doppler imaging with normal direction of blood flow towards the liver. Other: None. IMPRESSION: 1. Gallbladder wall is thickened, however, no sonographic Murphy's sign was elicited during the exam to confirm an acute cholecystitis. Differential includes cholecystitis (acute and chronic), CHF, hepatic cirrhosis or hepatitis, and hypoalbuminemia. Consider nuclear medicine HIDA scan for further characterization. 2. Sludge and 5 mm stone within the gallbladder. 3. No focal liver lesion is seen by ultrasound. Multiple indeterminate hypodense lesions were seen in the liver on CT abdomen of 06/07/2021. Recommend further characterization with nonemergent liver MRI. Electronically Signed   By: Franki Cabot M.D.   On: 06/08/2021 14:07    Medications:  heparin 550 Units/hr (06/10/21 0349)    amiodarone  200 mg Oral Daily   atorvastatin  80 mg Oral Daily   bisacodyl  10 mg Rectal Daily   Chlorhexidine Gluconate Cloth  6 each Topical Q0600   darbepoetin (ARANESP) injection - DIALYSIS  60 mcg Intravenous Q Mon-HD   hydrocortisone  25 mg Rectal BID   magnesium oxide  200 mg Oral Daily   midodrine  10 mg Oral TID WC   polyethylene glycol  17 g Oral BID   senna-docusate  2 tablet Oral BID   sodium chloride flush  3 mL Intravenous Q12H    Dialysis Orders: MWF at [facility] 4hr, 400/500, EDW 72kg (although leaving 74kg range), 2K/2Ca, TDC (+ maturing AVF), heparin 2000 unit bolus - No ESA or VDRA   Assessment/Plan:  Rectal pain/?hemorrhoids/constipation: On aggressive bowel regimen.  Disimpacted by GI yesterday w/large amount of stool removed.  ?ileus on KUB. Per PMD/GI.   Liver/kidney lesions: Liver MRI vs CT per GI.   Dysphagia: Esophogram/swallow study pending.   ESRD:  MWF.  HD today per regular schedule.   Hypotension/volume: Chronically low BP, on midodrine 10mg  TID. No edema on exam.  Anemia: Hgb 9.4 - FOBT negative.  Tsat 24%, will order IV iron. ESA started.   Metabolic bone disease: CorrCa and Phos slightly high. Will need to start binding but holding off for now due to constipation.  Consider auryxia vs velphoro which are less constipating.   CAD/HFrEF  A-fib: On amiodarone Nutrition - on liquid diet currently.  Renal diet w/fluid restrictions once advanced.   Jen Mow, PA-C [facility] 06/10/2021,11:20 AM  LOS: 2 days

## 2021-06-11 ENCOUNTER — Other Ambulatory Visit (HOSPITAL_COMMUNITY): Payer: Self-pay

## 2021-06-11 ENCOUNTER — Encounter (HOSPITAL_COMMUNITY): Payer: Medicare Other

## 2021-06-11 LAB — RENAL FUNCTION PANEL
Albumin: 2.9 g/dL — ABNORMAL LOW (ref 3.5–5.0)
Anion gap: 15 (ref 5–15)
BUN: 44 mg/dL — ABNORMAL HIGH (ref 8–23)
CO2: 27 mmol/L (ref 22–32)
Calcium: 8.6 mg/dL — ABNORMAL LOW (ref 8.9–10.3)
Chloride: 97 mmol/L — ABNORMAL LOW (ref 98–111)
Creatinine, Ser: 5.19 mg/dL — ABNORMAL HIGH (ref 0.61–1.24)
GFR, Estimated: 11 mL/min — ABNORMAL LOW (ref >60)
Glucose, Bld: 108 mg/dL — ABNORMAL HIGH (ref 70–99)
Phosphorus: 5.8 mg/dL — ABNORMAL HIGH (ref 2.5–4.6)
Potassium: 4.1 mmol/L (ref 3.5–5.1)
Sodium: 139 mmol/L (ref 135–145)

## 2021-06-11 LAB — CBC
HCT: 28.2 % — ABNORMAL LOW (ref 39.0–52.0)
Hemoglobin: 9.5 g/dL — ABNORMAL LOW (ref 13.0–17.0)
MCH: 32.1 pg (ref 26.0–34.0)
MCHC: 33.7 g/dL (ref 30.0–36.0)
MCV: 95.3 fL (ref 80.0–100.0)
Platelets: 179 10*3/uL (ref 150–400)
RBC: 2.96 MIL/uL — ABNORMAL LOW (ref 4.22–5.81)
RDW: 15.1 % (ref 11.5–15.5)
WBC: 9.8 10*3/uL (ref 4.0–10.5)
nRBC: 0.8 % — ABNORMAL HIGH (ref 0.0–0.2)

## 2021-06-11 LAB — ERYTHROPOIETIN: Erythropoietin: 166.5 m[IU]/mL — ABNORMAL HIGH (ref 2.6–18.5)

## 2021-06-11 LAB — APTT: aPTT: 67 seconds — ABNORMAL HIGH (ref 24–36)

## 2021-06-11 LAB — HEPARIN LEVEL (UNFRACTIONATED): Heparin Unfractionated: >1.1 IU/mL — ABNORMAL HIGH (ref 0.30–0.70)

## 2021-06-11 MED ORDER — SODIUM CHLORIDE 0.9 % IV SOLN
100.0000 mg | INTRAVENOUS | Status: DC
  Filled 2021-06-11: qty 5

## 2021-06-11 MED ORDER — ENSURE ENLIVE PO LIQD
237.0000 mL | Freq: Three times a day (TID) | ORAL | Status: DC
  Administered 2021-06-11 – 2021-06-13 (×5): 237 mL via ORAL

## 2021-06-11 MED ORDER — RENA-VITE PO TABS
1.0000 | ORAL_TABLET | Freq: Every day | ORAL | Status: DC
  Administered 2021-06-11 – 2021-06-26 (×15): 1 via ORAL
  Filled 2021-06-11 (×17): qty 1

## 2021-06-11 MED ORDER — SODIUM CHLORIDE 0.9 % IV SOLN
125.0000 mg | Freq: Every day | INTRAVENOUS | Status: AC
  Administered 2021-06-11 – 2021-06-15 (×5): 125 mg via INTRAVENOUS
  Filled 2021-06-11 (×6): qty 10

## 2021-06-11 MED ORDER — CHLORHEXIDINE GLUCONATE CLOTH 2 % EX PADS
6.0000 | MEDICATED_PAD | Freq: Every day | CUTANEOUS | Status: DC
  Administered 2021-06-11 – 2021-06-20 (×8): 6 via TOPICAL

## 2021-06-11 MED ORDER — SODIUM CHLORIDE 0.9 % IV SOLN
6.2500 mg | Freq: Four times a day (QID) | INTRAVENOUS | Status: DC | PRN
  Administered 2021-06-11 – 2021-06-13 (×2): 6.25 mg via INTRAVENOUS
  Filled 2021-06-11 (×3): qty 0.25

## 2021-06-11 MED ORDER — IOHEXOL 9 MG/ML PO SOLN
ORAL | Status: AC
  Administered 2021-06-11: 500 mL
  Filled 2021-06-11: qty 1000

## 2021-06-11 NOTE — Progress Notes (Signed)
[facility] Progress Note   Subjective:   Patient seen and examined at bedside.   Admits to severe pain rectal pain associated with increased gas today.  Denies CP, SOB, abdominal pain, weakness and fatigue.  Family concerned about missing outpatient cardiology appointment today.    Objective Vitals:   06/11/21 0018 06/11/21 0418 06/11/21 0628 06/11/21 0753  BP: (!) 104/56 116/63  (!) 106/55  Pulse: 76 75  71  Resp: 19 20  17   Temp: 98 F (36.7 C) 97.9 F (36.6 C)  98.3 F (36.8 C)  TempSrc: Oral Oral  Oral  SpO2: 100% 96%  99%  Weight:   71 kg   Height:       Physical Exam General:chronically ill appearing male in NAD Heart:RRR, no mrg Lungs:CTAB, nml WOB on 3L via [place] Abdomen:soft, NTND Extremities:no LE edema Dialysis Access: LU AVF maturing +b/t, [facility]   Filed Weights   06/10/21 1432 06/10/21 1818 06/11/21 0628  Weight: 71.6 kg 71.5 kg 71 kg    Intake/Output Summary (Last 24 hours) at 06/11/2021 1052 Last data filed at 06/11/2021 0925 Gross per 24 hour  Intake 120 ml  Output 48 ml  Net 72 ml    Additional Objective Labs: Basic Metabolic Panel: Recent Labs  Lab 06/09/21 0050 06/10/21 0203 06/11/21 0649  NA 140 134* 139  K 4.4 4.8 4.1  CL 95* 92* 97*  CO2 27 26 27   GLUCOSE 151* 132* 108*  BUN 40* 69* 44*  CREATININE 5.90* 7.63* 5.19*  CALCIUM 9.1 9.1 8.6*  PHOS 5.8* 7.9* 5.8*   Liver Function Tests: Recent Labs  Lab 06/07/21 1610 06/08/21 1157 06/09/21 0050 06/10/21 0203 06/11/21 0649  AST 55*  --   --  113*  --   ALT 32  --   --  78*  --   ALKPHOS 90  --   --  126  --   BILITOT 0.9  --   --  0.9  --   PROT 7.1  --   --  6.1*  --   ALBUMIN 3.3*   < > 3.0* 3.1* 2.9*   < > = values in this interval not displayed.   Recent Labs  Lab 06/07/21 1610  LIPASE 39   CBC: Recent Labs  Lab 06/07/21 1610 06/08/21 1157 06/09/21 0050 06/10/21 0203 06/11/21 0649  WBC 7.0 7.2 9.4 10.3 9.8  NEUTROABS 5.4  --   --   --   --   HGB 9.4*  9.6* 9.1* 9.4* 9.5*  HCT 28.5* 28.6* 28.2* 28.4* 28.2*  MCV 95.3 96.0 95.6 95.3 95.3  PLT 177 182 185 202 179   Iron Studies:  Recent Labs    06/10/21 0203  IRON 65  TIBC 267  FERRITIN 745*   Lab Results  Component Value Date   INR 1.8 (H) 05/12/2021   INR 1.1 04/29/2021   Studies/Results: DG Abd 1 View  Result Date: 06/09/2021 CLINICAL DATA:  Abdominal pain, vomiting EXAM: ABDOMEN - 1 VIEW COMPARISON:  None. FINDINGS: There is mild dilation of small-bowel loops. Gas and stool are present in colon. Stomach is not distended. No abnormal masses or calcifications are seen. Sclerosis seen in the head of the right femur suggests possible avascular necrosis. IMPRESSION: Presence of gas in slightly dilated small bowel loops may suggest ileus. Possible avascular necrosis in the head of the right femur. Electronically Signed   By: Elmer Picker M.D.   On: 06/09/2021 12:31   DG Swallowing  Func-Speech Pathology  Result Date: 06/10/2021 Table formatting from the original result was not included. Objective Swallowing Evaluation: Type of Study: MBS-Modified Barium Swallow Study  Patient Details Name: Ruben [name] MRN: [id number] Date of Birth: Oct 31, 1945 Today's Date: 06/10/2021 Time: SLP Start Time (ACUTE ONLY): 1053 -SLP Stop Time (ACUTE ONLY): 5093 SLP Time Calculation (min) (ACUTE ONLY): 16 min Past Medical History: Past Medical History: Diagnosis Date  Arthritis   Chronic kidney disease, stage 3b ([place])   ESRD (end stage renal disease) ([place])   ETOH abuse   History of stress test 09/02/2008  showed inferolateral scar without ischemia  Hx of echocardiogram 09/02/2008  was essentially normal  Hyperlipidemia   Hypertension   Myocardial infarction [facility])   Tobacco abuse  Past Surgical History: Past Surgical History: Procedure Laterality Date  AV FISTULA PLACEMENT Left 05/27/2021  Procedure: LEFT ARTERIOVENOUS (AV) GRAFT CREATION;  Surgeon: Broadus John, MD;  Location: [place];  Service: Vascular;   Laterality: Left;  PERIPHERAL NERVE BLOCK  BACK SURGERY    Dr Luiz Ochoa involving L3-L4 discectomy.  CARDIOVERSION N/A 05/13/2021  Procedure: CARDIOVERSION;  Surgeon: Larey Dresser, MD;  Location: [facility] ENDOSCOPY;  Service: Cardiovascular;  Laterality: N/A;  HERNIA REPAIR    IR FLUORO GUIDE CV LINE RIGHT  05/21/2021  IR US GUIDE VASC ACCESS RIGHT  05/21/2021  RIGHT/LEFT HEART CATH AND CORONARY ANGIOGRAPHY N/A 05/09/2021  Procedure: RIGHT/LEFT HEART CATH AND CORONARY ANGIOGRAPHY;  Surgeon: Larey Dresser, MD;  Location: [place] CV LAB;  Service: Cardiovascular;  Laterality: N/A;  SPINE SURGERY    TEE WITHOUT CARDIOVERSION N/A 05/07/2021  Procedure: TRANSESOPHAGEAL ECHOCARDIOGRAM (TEE);  Surgeon: Larey Dresser, MD;  Location: [facility] ENDOSCOPY;  Service: Cardiovascular;  Laterality: N/A;  TEE WITHOUT CARDIOVERSION N/A 05/13/2021  Procedure: TRANSESOPHAGEAL ECHOCARDIOGRAM (TEE);  Surgeon: Larey Dresser, MD;  Location: [facility] ENDOSCOPY;  Service: Cardiovascular;  Laterality: N/A; HPI: Pt is a 76 y.o. male who presented with complaints of rectal pain and difficulty swallowing for 2 week prior to admission. Difficulty swallowing described in H&P as "like food and liquid gets stuck in his throat at times which causes him to vomit it back up." CXR on admission: Increasing bibasilar opacities which may represent atelectasis or airspace disease. GI consulted; MD recommended for esophagram as initial workup since pt's recent NSTEMI places him at high risk for endoscopic procedures; PA recommended MBS and esophagram if MBS is inconclusive. PMH: hypertension, hyperlipidemia, ESRD on HD, CAD, CHF, A. fib with RVR, prior alcohol abuse, NSTEMI, and tobacco abuse.  Subjective: pt nauseous  Recommendations for follow up therapy are one component of a multi-disciplinary discharge planning process, led by the attending physician.  Recommendations may be updated based on patient status, additional functional criteria and insurance authorization.  Assessment / Plan / Recommendation Clinical Impressions 06/10/2021 Clinical Impression Pt's oropharyngeal swallowing is WFL across consistencies tested. He did have frequent gagging and dry heaving throughout testing, but no oropharyngeal difficulties that would account for his recent symptoms. From an oropharyngeal standpoint, he could advance to regular solids and thin liquids once medically able to do so. SLP to sign off acutely. SLP Visit Diagnosis Dysphagia, unspecified (R13.10) Attention and concentration deficit following -- Frontal lobe and executive function deficit following -- Impact on safety and function Risk for inadequate nutrition/hydration;Mild aspiration risk   Treatment Recommendations 06/10/2021 Treatment Recommendations No treatment recommended at this time   Prognosis 06/10/2021 Prognosis for Safe Diet Advancement Good Barriers to Reach Goals -- Barriers/Prognosis Comment -- Diet Recommendations  06/10/2021 SLP Diet Recommendations Regular solids;Thin liquid Liquid Administration via Cup;Straw Medication Administration Whole meds with liquid Compensations Slow rate;Small sips/bites Postural Changes Seated upright at 90 degrees;Remain semi-upright after after feeds/meals (Comment)   Other Recommendations 06/10/2021 Recommended Consults -- Oral Care Recommendations Oral care BID Other Recommendations -- Follow Up Recommendations No SLP follow up Assistance recommended at discharge None Functional Status Assessment Patient has not had a recent decline in their functional status Frequency and Duration  06/09/2021 Speech Therapy Frequency (ACUTE ONLY) min 2x/week Treatment Duration 2 weeks   Oral Phase 06/10/2021 Oral Phase WFL Oral - Pudding Teaspoon -- Oral - Pudding Cup -- Oral - Honey Teaspoon -- Oral - Honey Cup -- Oral - Nectar Teaspoon -- Oral - Nectar Cup -- Oral - Nectar Straw -- Oral - Thin Teaspoon -- Oral - Thin Cup -- Oral - Thin Straw -- Oral - Puree -- Oral - Mech Soft -- Oral - Regular -- Oral -  Multi-Consistency -- Oral - Pill -- Oral Phase - Comment --  Pharyngeal Phase 06/10/2021 Pharyngeal Phase WFL Pharyngeal- Pudding Teaspoon -- Pharyngeal -- Pharyngeal- Pudding Cup -- Pharyngeal -- Pharyngeal- Honey Teaspoon -- Pharyngeal -- Pharyngeal- Honey Cup -- Pharyngeal -- Pharyngeal- Nectar Teaspoon -- Pharyngeal -- Pharyngeal- Nectar Cup -- Pharyngeal -- Pharyngeal- Nectar Straw -- Pharyngeal -- Pharyngeal- Thin Teaspoon -- Pharyngeal -- Pharyngeal- Thin Cup -- Pharyngeal -- Pharyngeal- Thin Straw -- Pharyngeal -- Pharyngeal- Puree -- Pharyngeal -- Pharyngeal- Mechanical Soft -- Pharyngeal -- Pharyngeal- Regular -- Pharyngeal -- Pharyngeal- Multi-consistency -- Pharyngeal -- Pharyngeal- Pill -- Pharyngeal -- Pharyngeal Comment --  Cervical Esophageal Phase  06/10/2021 Cervical Esophageal Phase WFL Pudding Teaspoon -- Pudding Cup -- Honey Teaspoon -- Honey Cup -- Nectar Teaspoon -- Nectar Cup -- Nectar Straw -- Thin Teaspoon -- Thin Cup -- Thin Straw -- Puree -- Mechanical Soft -- Regular -- Multi-consistency -- Pill -- Cervical Esophageal Comment -- Osie Bond., M.A. CCC-SLP Acute Rehabilitation Services Pager [phone] Office [phone] 06/10/2021, 12:54 PM                      Medications:  heparin 600 Units/hr (06/10/21 1230)    amiodarone  200 mg Oral Daily   atorvastatin  80 mg Oral Daily   bisacodyl  10 mg Rectal Daily   Chlorhexidine Gluconate Cloth  6 each Topical Q0600   darbepoetin (ARANESP) injection - DIALYSIS  60 mcg Intravenous Q Mon-HD   hydrocortisone  25 mg Rectal BID   magnesium oxide  200 mg Oral Daily   midodrine  10 mg Oral TID WC   midodrine  10 mg Oral Q M,W,F-HD   polyethylene glycol  17 g Oral BID   senna-docusate  2 tablet Oral BID   sodium chloride flush  3 mL Intravenous Q12H    Dialysis Orders: MWF at [facility] 4hr, 400/500, EDW 72kg (although leaving 74kg range), 2K/2Ca, TDC (+ maturing AVF), heparin 2000 unit bolus - No ESA or VDRA   Assessment/Plan:  Rectal  pain/constipation: On aggressive bowel regimen.  Disimpacted by GI w/large amount of stool removed. Having good BM.  If no improvement in pain in next 24-48hr plan for FFS. Per PMD/GI.   Liver/kidney lesions: Liver MRI vs CT per GI.   Dysphagia: Swallow studies normal.   ESRD:  MWF.  HD tomorrow per regular schedule.   Hypotension/volume: Chronically low BP, on midodrine 10mg  TID. Ordered prn midodrine to be given pre HD. Minimal UF with HD yesterday due to hypotension.  No edema on exam but increased O2 requirement.  UF as tolerated tomorrow.  Anemia: Hgb 9.5 - FOBT negative. Tsat 24%, IV iron ordered. ESA started.   Metabolic bone disease: CorrCa and Phos slightly high. Will need to start binder but holding off for now due to constipation.  Consider auryxia vs velphoro which are less constipating.   CAD/HFrEF - missed outpatient cardiology follow up today.  Will need to reschedule after d/c unless need to be seen as inpatient.  A-fib: On amiodarone Nutrition - on liquid diet currently.  Renal diet w/fluid restrictions once advanced.   Jen Mow, PA-C [facility] 06/11/2021,10:52 AM  LOS: 3 days

## 2021-06-11 NOTE — Care Management Important Message (Signed)
Important Message  Patient Details  Name: Ruben [name] MRN: [id number] Date of Birth: [birth year]   Medicare Important Message Given:  Yes     Hannah Beat 06/11/2021, 1:10 PM

## 2021-06-11 NOTE — Progress Notes (Signed)
ANTICOAGULATION CONSULT NOTE  Pharmacy Consult for Heparin Indication: atrial fibrillation  Allergies  Allergen Reactions   Other Other (See Comments)    Pollen - sneezing, itching/watery eyes    Patient Measurements: Height: 5\' 11"  (180.3 cm) Weight: 71 kg (156 lb 8.4 oz) IBW/kg (Calculated) : 75.3 kg Heparin Dosing Weight: 78 kg  Vital Signs: Temp: 98.3 F (36.8 C) (02/07 0753) Temp Source: Oral (02/07 0753) BP: 106/55 (02/07 0753) Pulse Rate: 71 (02/07 0753)  Labs: Recent Labs    06/08/21 2129 06/09/21 0050 06/09/21 1549 06/10/21 0203 06/10/21 0956 06/11/21 0649  HGB  --  9.1*  --  9.4*  --  9.5*  HCT  --  28.2*  --  28.4*  --  28.2*  PLT  --  185  --  202  --  179  APTT 77* 93*   < > 70* 61* 67*  HEPARINUNFRC >1.10* >1.10*  --   --   --  >1.10*  CREATININE  --  5.90*  --  7.63*  --  5.19*   < > = values in this interval not displayed.     Estimated Creatinine Clearance: 12.4 mL/min (A) (by C-G formula based on SCr of 5.19 mg/dL (H)).  Assessment: 76 yo male with a history of atrial fibrillation and CAD presents with nausea, vomiting, rectal pain, and worsening abdominal pain. PTA the patient is on apixaban, last dose on 2/3 in the evening (per the patient and family members). Pharmacy is consulted to dose heparin.  Of note, the patient had a recent admission in 04/2021 for NSTEMI, cardiogenic shock, acute systolic CHF, mitral regurgitation, and aortic valve insufficiency.   aPTT therapeutic; heparin level remains elevated due to Eliquis.  No issue with heparin infusion nor bleeding reported.  Goal of Therapy:  Heparin level 0.3-0.7 units/ml aPTT 66-102 seconds Monitor platelets by anticoagulation protocol: Yes   Plan:  Continue heparin gtt at 600 units/hr Daily heparin level, aPTT and CBC   Joss Friedel D. Mina Marble, PharmD, BCPS, [place] 06/11/2021, 9:14 AM

## 2021-06-11 NOTE — Progress Notes (Signed)
PROGRESS NOTE  Ruben [name] KGO:[id number] DOB: [birth year] DOA: 06/07/2021 PCP: [name], FNP   LOS: 3 days   Brief Narrative / Interim history:  76 y.o. male with medical history significant of hypertension, hyperlipidemia, ESRD on HD, CAD, recent hospitalization for non-STEMI and acute systolic CHF with cardiogenic shock requiring ICU stay and inotrope support, A. fib with RVR, prior alcohol abuse, and tobacco abuse who presents with complaints of rectal pain and difficulty swallowing over the last 2 weeks.  His most recent hospital stay was prolonged, he was here for almost a month.  He has declined since with weight loss, poor p.o. intake, dysphagia as well as increased constipation.  There was concern for acute cholecystitis on admission and general surgery was consulted.  Initial imaging showed some nonspecific liver lesions, on a noncontrast study, and also patient complained of dysphagia for which GI was consulted.  Subjective / 24h Interval events: -Complains of poor appetite today.  Rectal pain still there but improved.  Assessment and Plan: Rectal pain and constipation- (present on admission) -patient reports complaints of constipation and rectal pain for which she has been unable to have adequate bowel movement despite taking stool softeners, sitz bath's, use of Preparation H, and suppositories. CT scan of the abdomen pelvis did not note any signs of fecal impaction or bowel obstruction, but appears to show stool throughout the colon.  GI consulted, status post manual disimpaction.  Continue aggressive bowel regimen, significantly improved, continues to have good bowel movements  Chronic systolic CHF (congestive heart failure) ([place])- (present on admission) -Following NSTEMI in January 2023 patient was noted to have gone into cardiogenic shock and required temporary stay in ICU requiring pressors which were able to be weaned off and he was transition to midodrine.  EF 35-40%  on echocardiogram with moderate mitral regurgitation. Currently patient does not appear grossly fluid overloaded.  Continue midodrine, fluid management per dialysis  Liver lesions - CT scan on admission showed low-attenuation lesions measuring about 12 mm diameter in the liver.  Defer to GI, discussed with Dr. Collene Mares, she will talk to radiology about breast imaging modalities.  Keep on heparin in case a biopsy is needed  CAD (coronary artery disease)- (present on admission) -Patient had recent NSTEMI 04/2021 for which he underwent cardiac catheterization noting RCA occlusion with diffuse disease of the LAD and left circumflex.  It was not amendable to PCI and he was not a surgical candidate for CABG. Continue statin  Paroxysmal atrial fibrillation ([place]) -Patient appears to be in sinus rhythm at this time and rate controlled. Held Eliquis. Heparin per pharmacy for possibility of needed procedure. Continue amiodarone 200 mg daily  ESRD on hemodialysis [facility]) -He has new end-stage renal disease following his most recent hospital stay.  He was discharged January 2023.  Currently MWF.  Just had an AV fistula placed, it has not matured and currently having dialysis via HD cath.  Dialysis tomorrow  Transaminitis- (present on admission) -AST was noted to be mildly elevated at 55, but lipase was noted to be within normal limits.  Possibly related with patient's prior history of abuse.  Normocytic anemia- (present on admission) -Stable.  No bleeding  ETOH abuse -Patient has not not drank alcohol since he was admitted to the hospital in December 2022. Continue to encourage abstinence from alcohol  Dysphagia -Patient complains of food and liquids getting stuck in his throat causing him to vomit it back up.  Denies complaints of coughing while eating.  He  has a prior history of alcohol abuse.  Management per gastroenterology  Suspected acute cholecystitis -Patient presented with complaints of abdominal  pain with nausea and vomiting on admission.  CT scan of the abdomen pelvis significant for increased density in the gallbladder concerning for milk of calcium or large gallstones with gallbladder wall thickening.  General surgery has been consulted and evaluated patient, and they do not feel that imaging and physical exam and symptoms are consistent with acute cholecystitis.  They signed off.  He was initially placed on ceftriaxone but it has been discontinued now  Weight loss -family reports 20-30 pound weight loss over the last month.  Question if multifactorial in the recent NSTEMI, ESRD, and constipation with poor p.o. intake.  TSH unremarkable.  Nonspecific liver lesions noted on the CT scan are concerning, GI to weigh in  HLD (hyperlipidemia)- (present on admission) -Continue statin   Scheduled Meds:  amiodarone  200 mg Oral Daily   atorvastatin  80 mg Oral Daily   bisacodyl  10 mg Rectal Daily   Chlorhexidine Gluconate Cloth  6 each Topical Q0600   darbepoetin (ARANESP) injection - DIALYSIS  60 mcg Intravenous Q Mon-HD   hydrocortisone  25 mg Rectal BID   magnesium oxide  200 mg Oral Daily   midodrine  10 mg Oral TID WC   midodrine  10 mg Oral Q M,W,F-HD   polyethylene glycol  17 g Oral BID   senna-docusate  2 tablet Oral BID   sodium chloride flush  3 mL Intravenous Q12H   Continuous Infusions:  heparin 600 Units/hr (06/10/21 1230)   PRN Meds:.acetaminophen **OR** acetaminophen, albuterol, HYDROmorphone (DILAUDID) injection, ondansetron (ZOFRAN) IV, traMADol  Diet Orders (From admission, onward)     Start     Ordered   06/09/21 0940  Diet full liquid Room service appropriate? Yes with Assist; Fluid consistency: Thin  Diet effective now       Question Answer Comment  Room service appropriate? Yes with Assist   Fluid consistency: Thin      06/09/21 0939            DVT prophylaxis:    Lab Results  Component Value Date   PLT 179 06/11/2021      Code Status:  Full Code  Family Communication: Son was present at bedside  Status is: Inpatient  Remains inpatient appropriate because: Severity of illness   Level of care: Telemetry Medical  Consultants:  Gastroenterology General surgery Nephrology  Procedures:  none  Microbiology  none  Antimicrobials: none    Objective: Vitals:   06/11/21 0018 06/11/21 0418 06/11/21 0628 06/11/21 0753  BP: (!) 104/56 116/63  (!) 106/55  Pulse: 76 75  71  Resp: 19 20  17   Temp: 98 F (36.7 C) 97.9 F (36.6 C)  98.3 F (36.8 C)  TempSrc: Oral Oral  Oral  SpO2: 100% 96%  99%  Weight:   71 kg   Height:        Intake/Output Summary (Last 24 hours) at 06/11/2021 1001 Last data filed at 06/10/2021 1818 Gross per 24 hour  Intake --  Output 48 ml  Net -48 ml    Wt Readings from Last 3 Encounters:  06/11/21 71 kg  06/02/21 23.1 kg  05/28/21 75.1 kg    Examination:  Constitutional: NAD Eyes: lids and conjunctivae normal, no scleral icterus ENMT: mmm Neck: normal, supple Respiratory: clear to auscultation bilaterally, no wheezing, no crackles. Normal respiratory effort.  Cardiovascular: Regular rate and  rhythm, no murmurs / rubs / gallops. No LE edema. Abdomen: soft, no distention, no tenderness. Bowel sounds positive.  Skin: no rashes Neurologic: no focal deficits, equal strength   Data Reviewed: I have independently reviewed following labs and imaging studies   CBC Recent Labs  Lab 06/07/21 1610 06/08/21 1157 06/09/21 0050 06/10/21 0203 06/11/21 0649  WBC 7.0 7.2 9.4 10.3 9.8  HGB 9.4* 9.6* 9.1* 9.4* 9.5*  HCT 28.5* 28.6* 28.2* 28.4* 28.2*  PLT 177 182 185 202 179  MCV 95.3 96.0 95.6 95.3 95.3  MCH 31.4 32.2 30.8 31.5 32.1  MCHC 33.0 33.6 32.3 33.1 33.7  RDW 14.7 14.6 14.6 14.9 15.1  LYMPHSABS 1.0  --   --   --   --   MONOABS 0.6  --   --   --   --   EOSABS 0.0  --   --   --   --   BASOSABS 0.0  --   --   --   --      Recent Labs  Lab 06/07/21 1610 06/08/21 1157  06/09/21 0050 06/10/21 0203 06/11/21 0649  NA 140 141 140 134* 139  K 3.6 4.2 4.4 4.8 4.1  CL 95* 96* 95* 92* 97*  CO2 32 29 27 26 27   GLUCOSE 121* 123* 151* 132* 108*  BUN 10 25* 40* 69* 44*  CREATININE 2.80* 4.91* 5.90* 7.63* 5.19*  CALCIUM 8.3* 9.0 9.1 9.1 8.6*  AST 55*  --   --  113*  --   ALT 32  --   --  78*  --   ALKPHOS 90  --   --  126  --   BILITOT 0.9  --   --  0.9  --   ALBUMIN 3.3* 2.9* 3.0* 3.1* 2.9*  MG 1.9  --   --   --   --   TSH  --  2.476  --   --   --      ------------------------------------------------------------------------------------------------------------------ No results for input(s): CHOL, HDL, LDLCALC, TRIG, CHOLHDL, LDLDIRECT in the last 72 hours.  Lab Results  Component Value Date   HGBA1C 5.7 (H) 04/30/2021   ------------------------------------------------------------------------------------------------------------------ Recent Labs    06/08/21 1157  TSH 2.476     Cardiac Enzymes No results for input(s): CKMB, TROPONINI, MYOGLOBIN in the last 168 hours.  Invalid input(s): CK ------------------------------------------------------------------------------------------------------------------    Component Value Date/Time   BNP 3,498.9 (H) 06/02/2021 0958    CBG: No results for input(s): GLUCAP in the last 168 hours.  Recent Results (from the past 240 hour(s))  Resp Panel by RT-PCR (Flu A&B, Covid) Nasopharyngeal Swab     Status: None   Collection Time: 06/07/21  8:40 PM   Specimen: Nasopharyngeal Swab; Nasopharyngeal(NP) swabs in vial transport medium  Result Value Ref Range Status   SARS Coronavirus 2 by RT PCR NEGATIVE NEGATIVE Final    Comment: (NOTE) SARS-CoV-2 target nucleic acids are NOT DETECTED.  The SARS-CoV-2 RNA is generally detectable in upper respiratory specimens during the acute phase of infection. The lowest concentration of SARS-CoV-2 viral copies this assay can detect is 138 copies/mL. A negative result does  not preclude SARS-Cov-2 infection and should not be used as the sole basis for treatment or other patient management decisions. A negative result may occur with  improper specimen collection/handling, submission of specimen other than nasopharyngeal swab, presence of viral mutation(s) within the areas targeted by this assay, and inadequate number of viral copies(<138 copies/mL). A negative  result must be combined with clinical observations, patient history, and epidemiological information. The expected result is Negative.  Fact Sheet for Patients:  EntrepreneurPulse.com.au  Fact Sheet for Healthcare Providers:  IncredibleEmployment.be  This test is no t yet approved or cleared by the [place] FDA and  has been authorized for detection and/or diagnosis of SARS-CoV-2 by FDA under an Emergency Use Authorization (EUA). This EUA will remain  in effect (meaning this test can be used) for the duration of the COVID-19 declaration under Section 564(b)(1) of the Act, 21 U.S.C.section 360bbb-3(b)(1), unless the authorization is terminated  or revoked sooner.       Influenza A by PCR NEGATIVE NEGATIVE Final   Influenza B by PCR NEGATIVE NEGATIVE Final    Comment: (NOTE) The Xpert Xpress SARS-CoV-2/FLU/RSV plus assay is intended as an aid in the diagnosis of influenza from Nasopharyngeal swab specimens and should not be used as a sole basis for treatment. Nasal washings and aspirates are unacceptable for Xpert Xpress SARS-CoV-2/FLU/RSV testing.  Fact Sheet for Patients: EntrepreneurPulse.com.au  Fact Sheet for Healthcare Providers: IncredibleEmployment.be  This test is not yet approved or cleared by the [place] FDA and has been authorized for detection and/or diagnosis of SARS-CoV-2 by FDA under an Emergency Use Authorization (EUA). This EUA will remain in effect (meaning this test can be used) for the  duration of the COVID-19 declaration under Section 564(b)(1) of the Act, 21 U.S.C. section 360bbb-3(b)(1), unless the authorization is terminated or revoked.  Performed at [facility], [address] [place], [place] [address]      Radiology Studies: DG Swallowing [facility]  Result Date: 06/10/2021 Table formatting from the original result was not included. Objective Swallowing Evaluation: Type of Study: MBS-Modified Barium Swallow Study  Patient Details Name: Ruben [name] MRN: [id number] Date of Birth: 1945-12-13 Today's Date: 06/10/2021 Time: SLP Start Time (ACUTE ONLY): 1053 -SLP Stop Time (ACUTE ONLY): 8850 SLP Time Calculation (min) (ACUTE ONLY): 16 min Past Medical History: Past Medical History: Diagnosis Date  Arthritis   Chronic kidney disease, stage 3b ([place])   ESRD (end stage renal disease) ([place])   ETOH abuse   History of stress test 09/02/2008  showed inferolateral scar without ischemia  Hx of echocardiogram 09/02/2008  was essentially normal  Hyperlipidemia   Hypertension   Myocardial infarction [facility])   Tobacco abuse  Past Surgical History: Past Surgical History: Procedure Laterality Date  AV FISTULA PLACEMENT Left 05/27/2021  Procedure: LEFT ARTERIOVENOUS (AV) GRAFT CREATION;  Surgeon: Broadus John, MD;  Location: [place];  Service: Vascular;  Laterality: Left;  PERIPHERAL NERVE BLOCK  BACK SURGERY    Dr Luiz Ochoa involving L3-L4 discectomy.  CARDIOVERSION N/A 05/13/2021  Procedure: CARDIOVERSION;  Surgeon: Larey Dresser, MD;  Location: [facility] ENDOSCOPY;  Service: Cardiovascular;  Laterality: N/A;  HERNIA REPAIR    IR FLUORO GUIDE CV LINE RIGHT  05/21/2021  IR US GUIDE VASC ACCESS RIGHT  05/21/2021  RIGHT/LEFT HEART CATH AND CORONARY ANGIOGRAPHY N/A 05/09/2021  Procedure: RIGHT/LEFT HEART CATH AND CORONARY ANGIOGRAPHY;  Surgeon: Larey Dresser, MD;  Location: [place] CV LAB;  Service: Cardiovascular;  Laterality: N/A;  SPINE SURGERY    TEE WITHOUT CARDIOVERSION N/A  05/07/2021  Procedure: TRANSESOPHAGEAL ECHOCARDIOGRAM (TEE);  Surgeon: Larey Dresser, MD;  Location: [facility] ENDOSCOPY;  Service: Cardiovascular;  Laterality: N/A;  TEE WITHOUT CARDIOVERSION N/A 05/13/2021  Procedure: TRANSESOPHAGEAL ECHOCARDIOGRAM (TEE);  Surgeon: Larey Dresser, MD;  Location: [facility] ENDOSCOPY;  Service: Cardiovascular;  Laterality: N/A; HPI: Pt is a 76 y.o. male who presented with complaints of rectal pain and difficulty swallowing for 2 week prior to admission. Difficulty swallowing described in H&P as "like food and liquid gets stuck in his throat at times which causes him to vomit it back up." CXR on admission: Increasing bibasilar opacities which may represent atelectasis or airspace disease. GI consulted; MD recommended for esophagram as initial workup since pt's recent NSTEMI places him at high risk for endoscopic procedures; PA recommended MBS and esophagram if MBS is inconclusive. PMH: hypertension, hyperlipidemia, ESRD on HD, CAD, CHF, A. fib with RVR, prior alcohol abuse, NSTEMI, and tobacco abuse.  Subjective: pt nauseous  Recommendations for follow up therapy are one component of a multi-disciplinary discharge planning process, led by the attending physician.  Recommendations may be updated based on patient status, additional functional criteria and insurance authorization. Assessment / Plan / Recommendation Clinical Impressions 06/10/2021 Clinical Impression Pt's oropharyngeal swallowing is WFL across consistencies tested. He did have frequent gagging and dry heaving throughout testing, but no oropharyngeal difficulties that would account for his recent symptoms. From an oropharyngeal standpoint, he could advance to regular solids and thin liquids once medically able to do so. SLP to sign off acutely. SLP Visit Diagnosis Dysphagia, unspecified (R13.10) Attention and concentration deficit following -- Frontal lobe and executive function deficit following -- Impact on safety and function Risk  for inadequate nutrition/hydration;Mild aspiration risk   Treatment Recommendations 06/10/2021 Treatment Recommendations No treatment recommended at this time   Prognosis 06/10/2021 Prognosis for Safe Diet Advancement Good Barriers to Reach Goals -- Barriers/Prognosis Comment -- Diet Recommendations 06/10/2021 SLP Diet Recommendations Regular solids;Thin liquid Liquid Administration via Cup;Straw Medication Administration Whole meds with liquid Compensations Slow rate;Small sips/bites Postural Changes Seated upright at 90 degrees;Remain semi-upright after after feeds/meals (Comment)   Other Recommendations 06/10/2021 Recommended Consults -- Oral Care Recommendations Oral care BID Other Recommendations -- Follow Up Recommendations No SLP follow up Assistance recommended at discharge None Functional Status Assessment Patient has not had a recent decline in their functional status Frequency and Duration  06/09/2021 Speech Therapy Frequency (ACUTE ONLY) min 2x/week Treatment Duration 2 weeks   Oral Phase 06/10/2021 Oral Phase WFL Oral - Pudding Teaspoon -- Oral - Pudding Cup -- Oral - Honey Teaspoon -- Oral - Honey Cup -- Oral - Nectar Teaspoon -- Oral - Nectar Cup -- Oral - Nectar Straw -- Oral - Thin Teaspoon -- Oral - Thin Cup -- Oral - Thin Straw -- Oral - Puree -- Oral - Mech Soft -- Oral - Regular -- Oral - Multi-Consistency -- Oral - Pill -- Oral Phase - Comment --  Pharyngeal Phase 06/10/2021 Pharyngeal Phase WFL Pharyngeal- Pudding Teaspoon -- Pharyngeal -- Pharyngeal- Pudding Cup -- Pharyngeal -- Pharyngeal- Honey Teaspoon -- Pharyngeal -- Pharyngeal- Honey Cup -- Pharyngeal -- Pharyngeal- Nectar Teaspoon -- Pharyngeal -- Pharyngeal- Nectar Cup -- Pharyngeal -- Pharyngeal- Nectar Straw -- Pharyngeal -- Pharyngeal- Thin Teaspoon -- Pharyngeal -- Pharyngeal- Thin Cup -- Pharyngeal -- Pharyngeal- Thin Straw -- Pharyngeal -- Pharyngeal- Puree -- Pharyngeal -- Pharyngeal- Mechanical Soft -- Pharyngeal -- Pharyngeal- Regular --  Pharyngeal -- Pharyngeal- Multi-consistency -- Pharyngeal -- Pharyngeal- Pill -- Pharyngeal -- Pharyngeal Comment --  Cervical Esophageal Phase  06/10/2021 Cervical Esophageal Phase WFL Pudding Teaspoon -- Pudding Cup -- Honey Teaspoon -- Honey Cup -- Nectar Teaspoon -- Nectar Cup -- Nectar Straw -- Thin Teaspoon -- Thin Cup -- Thin Straw -- Puree -- Mechanical Soft -- Regular -- Multi-consistency --  Pill -- Cervical Esophageal Comment -- Osie Bond., M.A. CCC-SLP Acute Rehabilitation Services Pager [phone] Office [phone] 06/10/2021, 12:54 PM                       Marzetta Board, MD, [name] [facility]  Between 7 am - 7 pm I am available, please contact me via Amion (for emergencies) or Securechat (non urgent messages)  Between 7 pm - 7 am I am not available, please contact night coverage MD/APP via Amion

## 2021-06-11 NOTE — Progress Notes (Signed)
Subjective: Proctalgia.  Severe pain with gas.  Objective: Vital signs in last 24 hours: Temp:  [96.8 F (36 C)-98.3 F (36.8 C)] 98.3 F (36.8 C) (02/07 0753) Pulse Rate:  [71-89] 71 (02/07 0753) Resp:  [17-24] 17 (02/07 0753) BP: (91-116)/(45-63) 106/55 (02/07 0753) SpO2:  [96 %-100 %] 99 % (02/07 0753) Weight:  [71 kg-71.6 kg] 71 kg (02/07 0628) Last BM Date: 06/10/21  Intake/Output from previous day: 02/06 0701 - 02/07 0700 In: 220 [P.O.:220] Out: 48  Intake/Output this shift: No intake/output data recorded.  General appearance: alert and no distress GI: soft, non-tender; bowel sounds normal; no masses,  no organomegaly  Lab Results: Recent Labs    06/09/21 0050 06/10/21 0203 06/11/21 0649  WBC 9.4 10.3 9.8  HGB 9.1* 9.4* 9.5*  HCT 28.2* 28.4* 28.2*  PLT 185 202 179   BMET Recent Labs    06/09/21 0050 06/10/21 0203 06/11/21 0649  NA 140 134* 139  K 4.4 4.8 4.1  CL 95* 92* 97*  CO2 27 26 27   GLUCOSE 151* 132* 108*  BUN 40* 69* 44*  CREATININE 5.90* 7.63* 5.19*  CALCIUM 9.1 9.1 8.6*   LFT Recent Labs    06/10/21 0203 06/11/21 0649  PROT 6.1*  --   ALBUMIN 3.1* 2.9*  AST 113*  --   ALT 78*  --   ALKPHOS 126  --   BILITOT 0.9  --    PT/INR No results for input(s): LABPROT, INR in the last 72 hours. Hepatitis Panel No results for input(s): HEPBSAG, HCVAB, HEPAIGM, HEPBIGM in the last 72 hours. C-Diff No results for input(s): CDIFFTOX in the last 72 hours. Fecal Lactopherrin No results for input(s): FECLLACTOFRN in the last 72 hours.  Studies/Results: DG Abd 1 View  Result Date: 06/09/2021 CLINICAL DATA:  Abdominal pain, vomiting EXAM: ABDOMEN - 1 VIEW COMPARISON:  None. FINDINGS: There is mild dilation of small-bowel loops. Gas and stool are present in colon. Stomach is not distended. No abnormal masses or calcifications are seen. Sclerosis seen in the head of the right femur suggests possible avascular necrosis. IMPRESSION: Presence of gas in  slightly dilated small bowel loops may suggest ileus. Possible avascular necrosis in the head of the right femur. Electronically Signed   By: Elmer Picker M.D.   On: 06/09/2021 12:31   DG Swallowing Func-Speech Pathology  Result Date: 06/10/2021 Table formatting from the original result was not included. Objective Swallowing Evaluation: Type of Study: MBS-Modified Barium Swallow Study  Patient Details Name: Ruben [name] MRN: [id number] Date of Birth: 12/09/1945 Today's Date: 06/10/2021 Time: SLP Start Time (ACUTE ONLY): 1053 -SLP Stop Time (ACUTE ONLY): 0973 SLP Time Calculation (min) (ACUTE ONLY): 16 min Past Medical History: Past Medical History: Diagnosis Date  Arthritis   Chronic kidney disease, stage 3b ([place])   ESRD (end stage renal disease) ([place])   ETOH abuse   History of stress test 09/02/2008  showed inferolateral scar without ischemia  Hx of echocardiogram 09/02/2008  was essentially normal  Hyperlipidemia   Hypertension   Myocardial infarction [facility])   Tobacco abuse  Past Surgical History: Past Surgical History: Procedure Laterality Date  AV FISTULA PLACEMENT Left 05/27/2021  Procedure: LEFT ARTERIOVENOUS (AV) GRAFT CREATION;  Surgeon: Broadus John, MD;  Location: [place];  Service: Vascular;  Laterality: Left;  PERIPHERAL NERVE BLOCK  BACK SURGERY    Dr Luiz Ochoa involving L3-L4 discectomy.  CARDIOVERSION N/A 05/13/2021  Procedure: CARDIOVERSION;  Surgeon: Larey Dresser, MD;  Location: [facility]  ENDOSCOPY;  Service: Cardiovascular;  Laterality: N/A;  HERNIA REPAIR    IR FLUORO GUIDE CV LINE RIGHT  05/21/2021  IR US GUIDE VASC ACCESS RIGHT  05/21/2021  RIGHT/LEFT HEART CATH AND CORONARY ANGIOGRAPHY N/A 05/09/2021  Procedure: RIGHT/LEFT HEART CATH AND CORONARY ANGIOGRAPHY;  Surgeon: Larey Dresser, MD;  Location: [place] CV LAB;  Service: Cardiovascular;  Laterality: N/A;  SPINE SURGERY    TEE WITHOUT CARDIOVERSION N/A 05/07/2021  Procedure: TRANSESOPHAGEAL ECHOCARDIOGRAM (TEE);  Surgeon: Larey Dresser, MD;  Location: [facility] ENDOSCOPY;  Service: Cardiovascular;  Laterality: N/A;  TEE WITHOUT CARDIOVERSION N/A 05/13/2021  Procedure: TRANSESOPHAGEAL ECHOCARDIOGRAM (TEE);  Surgeon: Larey Dresser, MD;  Location: [facility] ENDOSCOPY;  Service: Cardiovascular;  Laterality: N/A; HPI: Pt is a 76 y.o. male who presented with complaints of rectal pain and difficulty swallowing for 2 week prior to admission. Difficulty swallowing described in H&P as "like food and liquid gets stuck in his throat at times which causes him to vomit it back up." CXR on admission: Increasing bibasilar opacities which may represent atelectasis or airspace disease. GI consulted; MD recommended for esophagram as initial workup since pt's recent NSTEMI places him at high risk for endoscopic procedures; PA recommended MBS and esophagram if MBS is inconclusive. PMH: hypertension, hyperlipidemia, ESRD on HD, CAD, CHF, A. fib with RVR, prior alcohol abuse, NSTEMI, and tobacco abuse.  Subjective: pt nauseous  Recommendations for follow up therapy are one component of a multi-disciplinary discharge planning process, led by the attending physician.  Recommendations may be updated based on patient status, additional functional criteria and insurance authorization. Assessment / Plan / Recommendation Clinical Impressions 06/10/2021 Clinical Impression Pt's oropharyngeal swallowing is WFL across consistencies tested. He did have frequent gagging and dry heaving throughout testing, but no oropharyngeal difficulties that would account for his recent symptoms. From an oropharyngeal standpoint, he could advance to regular solids and thin liquids once medically able to do so. SLP to sign off acutely. SLP Visit Diagnosis Dysphagia, unspecified (R13.10) Attention and concentration deficit following -- Frontal lobe and executive function deficit following -- Impact on safety and function Risk for inadequate nutrition/hydration;Mild aspiration risk   Treatment  Recommendations 06/10/2021 Treatment Recommendations No treatment recommended at this time   Prognosis 06/10/2021 Prognosis for Safe Diet Advancement Good Barriers to Reach Goals -- Barriers/Prognosis Comment -- Diet Recommendations 06/10/2021 SLP Diet Recommendations Regular solids;Thin liquid Liquid Administration via Cup;Straw Medication Administration Whole meds with liquid Compensations Slow rate;Small sips/bites Postural Changes Seated upright at 90 degrees;Remain semi-upright after after feeds/meals (Comment)   Other Recommendations 06/10/2021 Recommended Consults -- Oral Care Recommendations Oral care BID Other Recommendations -- Follow Up Recommendations No SLP follow up Assistance recommended at discharge None Functional Status Assessment Patient has not had a recent decline in their functional status Frequency and Duration  06/09/2021 Speech Therapy Frequency (ACUTE ONLY) min 2x/week Treatment Duration 2 weeks   Oral Phase 06/10/2021 Oral Phase WFL Oral - Pudding Teaspoon -- Oral - Pudding Cup -- Oral - Honey Teaspoon -- Oral - Honey Cup -- Oral - Nectar Teaspoon -- Oral - Nectar Cup -- Oral - Nectar Straw -- Oral - Thin Teaspoon -- Oral - Thin Cup -- Oral - Thin Straw -- Oral - Puree -- Oral - Mech Soft -- Oral - Regular -- Oral - Multi-Consistency -- Oral - Pill -- Oral Phase - Comment --  Pharyngeal Phase 06/10/2021 Pharyngeal Phase WFL Pharyngeal- Pudding Teaspoon -- Pharyngeal -- Pharyngeal- Pudding Cup -- Pharyngeal -- Pharyngeal- Honey Teaspoon --  Pharyngeal -- Pharyngeal- Honey Cup -- Pharyngeal -- Pharyngeal- Nectar Teaspoon -- Pharyngeal -- Pharyngeal- Nectar Cup -- Pharyngeal -- Pharyngeal- Nectar Straw -- Pharyngeal -- Pharyngeal- Thin Teaspoon -- Pharyngeal -- Pharyngeal- Thin Cup -- Pharyngeal -- Pharyngeal- Thin Straw -- Pharyngeal -- Pharyngeal- Puree -- Pharyngeal -- Pharyngeal- Mechanical Soft -- Pharyngeal -- Pharyngeal- Regular -- Pharyngeal -- Pharyngeal- Multi-consistency -- Pharyngeal --  Pharyngeal- Pill -- Pharyngeal -- Pharyngeal Comment --  Cervical Esophageal Phase  06/10/2021 Cervical Esophageal Phase WFL Pudding Teaspoon -- Pudding Cup -- Honey Teaspoon -- Honey Cup -- Nectar Teaspoon -- Nectar Cup -- Nectar Straw -- Thin Teaspoon -- Thin Cup -- Thin Straw -- Puree -- Mechanical Soft -- Regular -- Multi-consistency -- Pill -- Cervical Esophageal Comment -- Ruben [name]., M.A. CCC-SLP Acute Rehabilitation Services Pager [phone] Office [phone] 06/10/2021, 12:54 PM                      Medications: Scheduled:  amiodarone  200 mg Oral Daily   atorvastatin  80 mg Oral Daily   bisacodyl  10 mg Rectal Daily   Chlorhexidine Gluconate Cloth  6 each Topical Q0600   darbepoetin (ARANESP) injection - DIALYSIS  60 mcg Intravenous Q Mon-HD   hydrocortisone  25 mg Rectal BID   magnesium oxide  200 mg Oral Daily   midodrine  10 mg Oral TID WC   midodrine  10 mg Oral Q M,W,F-HD   polyethylene glycol  17 g Oral BID   senna-docusate  2 tablet Oral BID   sodium chloride flush  3 mL Intravenous Q12H   Continuous:  heparin 600 Units/hr (06/10/21 1230)    Assessment/Plan: 1) Proctalgia. 2) Constipation. 3) Abnormal liver and renal lesions.   The patient was complaining about rectal pain this morning.  His son did report that he is having good bowel movements.  In fact, he had a bowel movement 3-4 hours prior to rounding.  He is on the appropriate medications.  It may be that he has a rectal ulceration secondary to the recent fecal impaction.  The anticipation is that this will pass.  Plan: 1) Continue with the current laxative regimen. 2) If there is no improvement over the next 24 - 48 hours a FFS will be performed. 3) Imaging of the liver will be pursued.  Dr. Collene Mares is looking into the proper scan to order.  LOS: 3 days   Tinsley Everman D 06/11/2021, 10:29 AM

## 2021-06-11 NOTE — Progress Notes (Signed)
Initial Nutrition Assessment  DOCUMENTATION CODES:  Severe malnutrition in context of chronic illness  INTERVENTION:  Continue current diet as ordered. Advance to regular diet pending tolerance Ensure Enlive po TID, each supplement provides 350 kcal and 20 grams of protein. Renavite daily  NUTRITION DIAGNOSIS:  Severe Malnutrition (in the context of chronic illness) related to poor appetite as evidenced by severe fat depletion, severe muscle depletion, percent weight loss (13.4% x 4 months).  GOAL:  Patient will meet greater than or equal to 90% of their needs  MONITOR:  PO intake, Diet advancement, Labs  REASON FOR ASSESSMENT:  Consult Assessment of nutrition requirement/status  ASSESSMENT:  76 y.o. male with history of CAD, HTN, HLD, EtOH abuse, CKD3, hx MI, and ESRD on HD who presented to ED with concern for rectal pain, nausea, vomiting, and worsening abdominal pain for several weeks.   Noted that family reports significant weight loss over the last month and that pt feels that his food is getting stuck while eating. N/V is worse while eating which has led to poor appetite.   SLP evaluated at bedside 2/5 and found swallowing to be normal but MBS performed 2/6 which confirmed the absence of abnormalities.  Pt resting in bed at the time of assessment, family at bedside assists with hx. Pt reports poor intake so far today, became nauseated several times during conversation just speaking about foods. Pt does not feel he would be able to tolerate a solid food diet yet. Family confirms that poor appetite has been ongoing for several weeks. States that pt has never had a large appetite, but intake has markedly decreased.   Pt reports prior to weight loss, he was stable at ~178 lbs. Current weight is 158 lbs. Muscle and fat depletions ar evident on exam. 13.4% weight loss is noted in the last 4 months which is severe (10/17-2/7). Some loss could be related to fluid   Pt does like ensure  supplements, will add TID, prefers strawberry. Family reports that pt was previously drinking the supplements at home 3-4 x/d.  Nutritionally Relevant Medications: Scheduled Meds:  atorvastatin  80 mg Oral Daily   bisacodyl  10 mg Rectal Daily   magnesium oxide  200 mg Oral Daily   polyethylene glycol  17 g Oral BID   senna-docusate  2 tablet Oral BID   PRN Meds: ondansetron   Labs Reviewed: BUN 44 / creatinine 5.19 Phosphorus 5.8   NUTRITION - FOCUSED PHYSICAL EXAM: Flowsheet Row Most Recent Value  Orbital Region Moderate depletion  Upper Arm Region Severe depletion  Thoracic and Lumbar Region Severe depletion  Buccal Region Moderate depletion  Temple Region Moderate depletion  Clavicle Bone Region Moderate depletion  Clavicle and Acromion Bone Region Moderate depletion  Scapular Bone Region Moderate depletion  Dorsal Hand Severe depletion  Patellar Region Severe depletion  Anterior Thigh Region Severe depletion  Posterior Calf Region Severe depletion  Edema (RD Assessment) None  Hair Reviewed  Eyes Reviewed  Skin Reviewed  Nails Reviewed   Diet Order:   Diet Order             Diet full liquid Room service appropriate? Yes with Assist; Fluid consistency: Thin  Diet effective now                   EDUCATION NEEDS:  Education needs have been addressed  Skin:  Skin Assessment: Reviewed RN Assessment  Last BM:  2/6 - type 5  Height:  Ht Readings from  Last 1 Encounters:  06/07/21 5\' 11"  (1.803 m)    Weight:  Wt Readings from Last 1 Encounters:  06/11/21 71 kg    Ideal Body Weight:  78.2 kg  BMI:  Body mass index is 21.83 kg/m.  Estimated Nutritional Needs:  Kcal:  2100-2300 kcal/d Protein:  100-110 g/d Fluid:  1L+UOP   Ranell Patrick, RD, LDN Clinical Dietitian RD pager # available in [facility]  After hours/weekend pager # available in [facility]

## 2021-06-11 NOTE — Consult Note (Signed)
° °  [facility] Inpatient Consult   06/11/2021  Ruben [name] 03-27-1946 [id number]  [facility] [ACO] Patient: [employer] Medicare  Primary Care Provider:  [name], [facility] is an embedded provider with a Chronic Care Management team and program, and is listed for the transition of care follow up and appointments.  Patient accessed for extreme high risk score and has readmitted with less than 30 days.   Patient was screened for Embedded practice service needs for chronic care management [CCM] and a recent referral request has been made.  Plan: A referral has been requested from previous admission for CCM and provider office does the [facility] for post hospital needs. Will continue to follow progress.  Please contact for further questions,  Natividad Brood, RN BSN [facility] Liaison  [phone] business mobile phone Toll free office [phone]  Fax number: [phone] [email or web address]

## 2021-06-12 ENCOUNTER — Inpatient Hospital Stay (HOSPITAL_COMMUNITY): Payer: Medicare Other

## 2021-06-12 LAB — PHOSPHORUS: Phosphorus: 6.6 mg/dL — ABNORMAL HIGH (ref 2.5–4.6)

## 2021-06-12 LAB — COMPREHENSIVE METABOLIC PANEL
ALT: 244 U/L — ABNORMAL HIGH (ref 0–44)
AST: 311 U/L — ABNORMAL HIGH (ref 15–41)
Albumin: 2.9 g/dL — ABNORMAL LOW (ref 3.5–5.0)
Alkaline Phosphatase: 196 U/L — ABNORMAL HIGH (ref 38–126)
Anion gap: 17 — ABNORMAL HIGH (ref 5–15)
BUN: 63 mg/dL — ABNORMAL HIGH (ref 8–23)
CO2: 22 mmol/L (ref 22–32)
Calcium: 8.8 mg/dL — ABNORMAL LOW (ref 8.9–10.3)
Chloride: 95 mmol/L — ABNORMAL LOW (ref 98–111)
Creatinine, Ser: 6.81 mg/dL — ABNORMAL HIGH (ref 0.61–1.24)
GFR, Estimated: 8 mL/min — ABNORMAL LOW (ref >60)
Glucose, Bld: 98 mg/dL (ref 70–99)
Potassium: 4.3 mmol/L (ref 3.5–5.1)
Sodium: 134 mmol/L — ABNORMAL LOW (ref 135–145)
Total Bilirubin: 1.3 mg/dL — ABNORMAL HIGH (ref 0.3–1.2)
Total Protein: 5.6 g/dL — ABNORMAL LOW (ref 6.5–8.1)

## 2021-06-12 LAB — CBC
HCT: 27.5 % — ABNORMAL LOW (ref 39.0–52.0)
Hemoglobin: 9.3 g/dL — ABNORMAL LOW (ref 13.0–17.0)
MCH: 32.3 pg (ref 26.0–34.0)
MCHC: 33.8 g/dL (ref 30.0–36.0)
MCV: 95.5 fL (ref 80.0–100.0)
Platelets: 184 10*3/uL (ref 150–400)
RBC: 2.88 MIL/uL — ABNORMAL LOW (ref 4.22–5.81)
RDW: 15.8 % — ABNORMAL HIGH (ref 11.5–15.5)
WBC: 8.9 10*3/uL (ref 4.0–10.5)
nRBC: 1.8 % — ABNORMAL HIGH (ref 0.0–0.2)

## 2021-06-12 LAB — HEPARIN LEVEL (UNFRACTIONATED): Heparin Unfractionated: >1.1 IU/mL — ABNORMAL HIGH (ref 0.30–0.70)

## 2021-06-12 LAB — APTT: aPTT: 81 seconds — ABNORMAL HIGH (ref 24–36)

## 2021-06-12 MED ORDER — HYDROXYZINE HCL 10 MG PO TABS
10.0000 mg | ORAL_TABLET | Freq: Three times a day (TID) | ORAL | Status: DC | PRN
  Administered 2021-06-13 – 2021-06-24 (×2): 10 mg via ORAL
  Filled 2021-06-12 (×6): qty 1

## 2021-06-12 MED ORDER — IOHEXOL 9 MG/ML PO SOLN
ORAL | Status: AC
  Administered 2021-06-12: 500 mL
  Filled 2021-06-12: qty 1000

## 2021-06-12 MED ORDER — IOHEXOL 300 MG/ML  SOLN
80.0000 mL | Freq: Once | INTRAMUSCULAR | Status: AC | PRN
  Administered 2021-06-12: 80 mL via INTRAVENOUS

## 2021-06-12 NOTE — Progress Notes (Signed)
Dr Hal Hope was aware that CT scan of the abd/pelvis is not done tonight and will discuss why, with ordering Md this AM per CT.

## 2021-06-12 NOTE — Progress Notes (Addendum)
Subjective: Patient seems to be nauseated while consuming the prep for CT scan.  He denies having any abdominal pain.  He has had some intermittent rectal pain. He describes having 1 loose BM this morning with rectal discomfort. His son is at his bedside. His LFT's have increased since admission.  Objective: Vital signs in last 24 hours: Temp:  [98.2 F (36.8 C)-98.4 F (36.9 C)] 98.2 F (36.8 C) (02/08 0434) Pulse Rate:  [71-83] 77 (02/08 0434) Resp:  [16-18] 16 (02/08 0434) BP: (104-120)/(55-75) 120/63 (02/08 0434) SpO2:  [95 %-99 %] 96 % (02/08 0434) Weight:  [72.1 kg] 72.1 kg (02/08 0500) Last BM Date: 06/11/21  Intake/Output from previous day: 02/07 0701 - 02/08 0700 In: 385.9 [P.O.:220; I.V.:54; IV Piggyback:111.9] Out: -  Intake/Output this shift: No intake/output data recorded.  General appearance: alert, cooperative, appears stated age, and mild distress Resp: clear to auscultation bilaterally Cardio: regular rate and rhythm, S1, S2 normal, no murmur, click, rub or gallop GI: soft, non-tender; bowel sounds normal; no masses,  no organomegaly  Lab Results: Recent Labs    06/10/21 0203 06/11/21 0649 06/12/21 0402  WBC 10.3 9.8 8.9  HGB 9.4* 9.5* 9.3*  HCT 28.4* 28.2* 27.5*  PLT 202 179 184   BMET Recent Labs    06/10/21 0203 06/11/21 0649 06/12/21 0402  NA 134* 139 134*  K 4.8 4.1 4.3  CL 92* 97* 95*  CO2 _0 GLUCOSE 132* 108* 98  BUN 69* 44* 63*  CREATININE 7.63* 5.19* 6.81*  CALCIUM 9.1 8.6* 8.8*   LFT Recent Labs    06/12/21 0402  PROT 5.6*  ALBUMIN 2.9*  AST 311*  ALT 244*  ALKPHOS 196*  BILITOT 1.3*   Studies/Results: DG Swallowing Func-Speech Pathology  Result Date: 06/10/2021 Table formatting from the original result was not included. Objective Swallowing Evaluation: Type of Study: MBS-Modified Barium Swallow Study  Patient Details Name: Jahmil Macleod MRN: [id number] Date of Birth: 05/10/1945 Today's Date: 06/10/2021 Time: SLP  Start Time (ACUTE ONLY): 1053 -SLP Stop Time (ACUTE ONLY): 1324 SLP Time Calculation (min) (ACUTE ONLY): 16 min Past Medical History: Past Medical History: Diagnosis Date  Arthritis   Chronic kidney disease, stage 3b ([place])   ESRD (end stage renal disease) ([place])   ETOH abuse   History of stress test 09/02/2008  showed inferolateral scar without ischemia  Hx of echocardiogram 09/02/2008  was essentially normal  Hyperlipidemia   Hypertension   Myocardial infarction [facility])   Tobacco abuse  Past Surgical History: Past Surgical History: Procedure Laterality Date  AV FISTULA PLACEMENT Left 05/27/2021  Procedure: LEFT ARTERIOVENOUS (AV) GRAFT CREATION;  Surgeon: Broadus John, MD;  Location: [facility];  Service: Vascular;  Laterality: Left;  PERIPHERAL NERVE BLOCK  BACK SURGERY    Dr Luiz Ochoa involving L3-L4 discectomy.  CARDIOVERSION N/A 05/13/2021  Procedure: CARDIOVERSION;  Surgeon: Larey Dresser, MD;  Location: [facility] ENDOSCOPY;  Service: Cardiovascular;  Laterality: N/A;  HERNIA REPAIR    IR FLUORO GUIDE CV LINE RIGHT  05/21/2021  IR US GUIDE VASC ACCESS RIGHT  05/21/2021  RIGHT/LEFT HEART CATH AND CORONARY ANGIOGRAPHY N/A 05/09/2021  Procedure: RIGHT/LEFT HEART CATH AND CORONARY ANGIOGRAPHY;  Surgeon: Larey Dresser, MD;  Location: [facility] CV LAB;  Service: Cardiovascular;  Laterality: N/A;  SPINE SURGERY    TEE WITHOUT CARDIOVERSION N/A 05/07/2021  Procedure: TRANSESOPHAGEAL ECHOCARDIOGRAM (TEE);  Surgeon: Larey Dresser, MD;  Location: [facility] ENDOSCOPY;  Service: Cardiovascular;  Laterality: N/A;  TEE WITHOUT  CARDIOVERSION N/A 05/13/2021  Procedure: TRANSESOPHAGEAL ECHOCARDIOGRAM (TEE);  Surgeon: Larey Dresser, MD;  Location: [facility] ENDOSCOPY;  Service: Cardiovascular;  Laterality: N/A; HPI: Pt is a 76 y.o. male who presented with complaints of rectal pain and difficulty swallowing for 2 week prior to admission. Difficulty swallowing described in H&P as "like food and liquid gets stuck in his throat at times which causes him to  vomit it back up." CXR on admission: Increasing bibasilar opacities which may represent atelectasis or airspace disease. GI consulted; MD recommended for esophagram as initial workup since pt's recent NSTEMI places him at high risk for endoscopic procedures; PA recommended MBS and esophagram if MBS is inconclusive. PMH: hypertension, hyperlipidemia, ESRD on HD, CAD, CHF, A. fib with RVR, prior alcohol abuse, NSTEMI, and tobacco abuse.  Subjective: pt nauseous  Recommendations for follow up therapy are one component of a multi-disciplinary discharge planning process, led by the attending physician.  Recommendations may be updated based on patient status, additional functional criteria and insurance authorization. Assessment / Plan / Recommendation Clinical Impressions 06/10/2021 Clinical Impression Pt's oropharyngeal swallowing is WFL across consistencies tested. He did have frequent gagging and dry heaving throughout testing, but no oropharyngeal difficulties that would account for his recent symptoms. From an oropharyngeal standpoint, he could advance to regular solids and thin liquids once medically able to do so. SLP to sign off acutely. SLP Visit Diagnosis Dysphagia, unspecified (R13.10) Attention and concentration deficit following -- Frontal lobe and executive function deficit following -- Impact on safety and function Risk for inadequate nutrition/hydration;Mild aspiration risk   Treatment Recommendations 06/10/2021 Treatment Recommendations No treatment recommended at this time   Prognosis 06/10/2021 Prognosis for Safe Diet Advancement Good Barriers to Reach Goals -- Barriers/Prognosis Comment -- Diet Recommendations 06/10/2021 SLP Diet Recommendations Regular solids;Thin liquid Liquid Administration via Cup;Straw Medication Administration Whole meds with liquid Compensations Slow rate;Small sips/bites Postural Changes Seated upright at 90 degrees;Remain semi-upright after after feeds/meals (Comment)   Other  Recommendations 06/10/2021 Recommended Consults -- Oral Care Recommendations Oral care BID Other Recommendations -- Follow Up Recommendations No SLP follow up Assistance recommended at discharge None Functional Status Assessment Patient has not had a recent decline in their functional status Frequency and Duration  06/09/2021 Speech Therapy Frequency (ACUTE ONLY) min 2x/week Treatment Duration 2 weeks   Oral Phase 06/10/2021 Oral Phase WFL Oral - Pudding Teaspoon -- Oral - Pudding Cup -- Oral - Honey Teaspoon -- Oral - Honey Cup -- Oral - Nectar Teaspoon -- Oral - Nectar Cup -- Oral - Nectar Straw -- Oral - Thin Teaspoon -- Oral - Thin Cup -- Oral - Thin Straw -- Oral - Puree -- Oral - Mech Soft -- Oral - Regular -- Oral - Multi-Consistency -- Oral - Pill -- Oral Phase - Comment --  Pharyngeal Phase 06/10/2021 Pharyngeal Phase WFL Pharyngeal- Pudding Teaspoon -- Pharyngeal -- Pharyngeal- Pudding Cup -- Pharyngeal -- Pharyngeal- Honey Teaspoon -- Pharyngeal -- Pharyngeal- Honey Cup -- Pharyngeal -- Pharyngeal- Nectar Teaspoon -- Pharyngeal -- Pharyngeal- Nectar Cup -- Pharyngeal -- Pharyngeal- Nectar Straw -- Pharyngeal -- Pharyngeal- Thin Teaspoon -- Pharyngeal -- Pharyngeal- Thin Cup -- Pharyngeal -- Pharyngeal- Thin Straw -- Pharyngeal -- Pharyngeal- Puree -- Pharyngeal -- Pharyngeal- Mechanical Soft -- Pharyngeal -- Pharyngeal- Regular -- Pharyngeal -- Pharyngeal- Multi-consistency -- Pharyngeal -- Pharyngeal- Pill -- Pharyngeal -- Pharyngeal Comment --  Cervical Esophageal Phase  06/10/2021 Cervical Esophageal Phase WFL Pudding Teaspoon -- Pudding Cup -- Honey Teaspoon -- Honey Cup -- Nectar Teaspoon -- Nectar  Cup -- [employer] -- Thin Teaspoon -- Thin Cup -- Thin Straw -- Puree -- Mechanical Soft -- Regular -- Multi-consistency -- Pill -- Cervical Esophageal Comment -- Osie Bond., M.A. [place] [facility] Pager [phone] Office [phone] 06/10/2021, 12:54 PM                      Medications: I  have reviewed the patient's current medications. Prior to Admission:  Medications Prior to Admission  Medication Sig Dispense Refill Last Dose   acetaminophen (TYLENOL) 650 MG CR tablet Take 650 mg by mouth every 8 (eight) hours as needed for pain.   Past Week   albuterol (PROVENTIL) (2.5 MG/3ML) 0.083% nebulizer solution Take 3 mLs (2.5 mg total) by nebulization every 4 (four) hours as needed for wheezing or shortness of breath. 75 mL 2 unk   amiodarone (PACERONE) 200 MG tablet Take 1 tablet (200 mg total) by mouth daily. 30 tablet 6 Past Week   apixaban (ELIQUIS) 5 MG TABS tablet Take 1 tablet (5 mg total) by mouth 2 (two) times daily. 60 tablet 6 Past Week   atorvastatin (LIPITOR) 80 MG tablet Take 1 tablet (80 mg total) by mouth daily. 30 tablet 6 Past Week   cholecalciferol (VITAMIN D3) 25 MCG (1000 UNIT) tablet Take 1,000 Units by mouth daily.   Past Week   Lidocaine-Hydrocortisone Ace 3-2.5 % KIT Place 1 application rectally 2 (two) times daily.   Past Week   Magnesium 200 MG TABS Take 1 tablet (200 mg total) by mouth daily. With evening meal (Patient taking differently: Take 200 mg by mouth daily.) 30 tablet 2 Past Week   midodrine (PROAMATINE) 10 MG tablet Take 1 tablet (10 mg total) by mouth 3 (three) times daily with meals. 90 tablet 6 Past Week   multivitamin (RENA-VIT) TABS tablet Take 1 tablet by mouth at bedtime. 30 tablet 6 Past Week   nitroGLYCERIN (NITROSTAT) 0.4 MG SL tablet Place 1 tablet (0.4 mg total) under the tongue every 5 (five) minutes as needed for chest pain. 100 tablet 3 unk   ondansetron (ZOFRAN) 4 MG tablet Take 1 tablet (4 mg total) by mouth daily as needed for nausea or vomiting. 30 tablet 1 Past Week   polyethylene glycol powder (GLYCOLAX/MIRALAX) powder Take 17 g by mouth 2 (two) times daily as needed. (Patient taking differently: Take 17 g by mouth 2 (two) times daily as needed for mild constipation.) 578 g 1 Past Week   traMADol (ULTRAM) 50 MG tablet Take 1  tablet (50 mg total) by mouth every 6 (six) hours as needed. (Patient taking differently: Take 50 mg by mouth every 6 (six) hours as needed for moderate pain.) 20 tablet 0 Past Week   lidocaine-hydrocortisone (ANAMANTLE) 3-1 % KIT Place 1 application rectally 2 (two) times daily. (Patient not taking: Reported on 06/10/2021) 1 kit 0 Not Taking   phenylephrine (,USE FOR PREPARATION-H,) 0.25 % suppository Place 1 suppository rectally 2 (two) times daily. (Patient not taking: Reported on 06/10/2021) 12 suppository 0 Not Taking   Scheduled:  amiodarone  200 mg Oral Daily   bisacodyl  10 mg Rectal Daily   Chlorhexidine Gluconate Cloth  6 each Topical Q0600   Chlorhexidine Gluconate Cloth  6 each Topical Q0600   darbepoetin (ARANESP) injection - DIALYSIS  60 mcg Intravenous Q Mon-HD   feeding supplement  237 mL Oral TID BM   hydrocortisone  25 mg Rectal BID   magnesium oxide  200 mg Oral  Daily   midodrine  10 mg Oral TID WC   midodrine  10 mg Oral Q M,W,F-HD   multivitamin  1 tablet Oral QHS   polyethylene glycol  17 g Oral BID   senna-docusate  2 tablet Oral BID   sodium chloride flush  3 mL Intravenous Q12H    Assessment/Plan: ) Proctalgia with constant chronic constipation-improved with aggressive bowel regimen. Patient may benefit from PR application of diltiazem 2% with lidocaine cream 2) Gallstones with sludge and wall thickening-no evidence of acute cholecystitis.  CT scan of the abdomen pelvis with contrast to be done to further evaluate the patient's symptoms. 3) Abnormal weight loss/multiple low density lesions in the liver/worsening LFT's-we will await the results of the CT scan ordered for today. 4) Chronic systolic CHF-s/p NSTEMI 09/8830. 5) History of paroxysmal atrial fibrillation.  6) New ESRD on HD. 7) Hyperlipidemia. 8) History of tobacco & alcohol abuse.   LOS: 2 days   LOS: 4 days   Juanita Craver 06/12/2021, 7:02 AM

## 2021-06-12 NOTE — Progress Notes (Signed)
[facility] Progress Note   Subjective:   Patient seen and examined at bedside.  More comfortable currently.  Nausea and abdominal pain this AM.  Recently given pain medications and not having any pain.  Denies CP, SOB, abdominal pain, nausea and vomiting. Family at bedside.    Objective Vitals:   06/12/21 1330 06/12/21 1400 06/12/21 1430 06/12/21 1519  BP: (!) 116/51 (!) 104/58 (!) 106/54 115/60  Pulse: 95 98 64 84  Resp:    (!) 26  Temp:    (!) 97.5 F (36.4 C)  TempSrc:    Oral  SpO2:    100%  Weight:    70.1 kg  Height:       Physical Exam General:chronically ill appearing male in NAD Heart:RRR, no mrg Lungs:CTAB, nml WOB on 2L Abdomen:soft, ND Extremities:no LE edema Dialysis Access:  LU AVF maturing +b/t, [facility]  Filed Weights   06/11/21 0628 06/12/21 0500 06/12/21 1519  Weight: 71 kg 72.1 kg 70.1 kg    Intake/Output Summary (Last 24 hours) at 06/12/2021 1535 Last data filed at 06/12/2021 1450 Gross per 24 hour  Intake 363.4 ml  Output 2000 ml  Net -1636.6 ml    Additional Objective Labs: Basic Metabolic Panel: Recent Labs  Lab 06/10/21 0203 06/11/21 0649 06/12/21 0402  NA 134* 139 134*  K 4.8 4.1 4.3  CL 92* 97* 95*  CO2 26 27 22   GLUCOSE 132* 108* 98  BUN 69* 44* 63*  CREATININE 7.63* 5.19* 6.81*  CALCIUM 9.1 8.6* 8.8*  PHOS 7.9* 5.8* 6.6*   Liver Function Tests: Recent Labs  Lab 06/07/21 1610 06/08/21 1157 06/10/21 0203 06/11/21 0649 06/12/21 0402  AST 55*  --  113*  --  311*  ALT 32  --  78*  --  244*  ALKPHOS 90  --  126  --  196*  BILITOT 0.9  --  0.9  --  1.3*  PROT 7.1  --  6.1*  --  5.6*  ALBUMIN 3.3*   < > 3.1* 2.9* 2.9*   < > = values in this interval not displayed.   Recent Labs  Lab 06/07/21 1610  LIPASE 39   CBC: Recent Labs  Lab 06/07/21 1610 06/08/21 1157 06/09/21 0050 06/10/21 0203 06/11/21 0649 06/12/21 0402  WBC 7.0 7.2 9.4 10.3 9.8 8.9  NEUTROABS 5.4  --   --   --   --   --   HGB 9.4* 9.6*  9.1* 9.4* 9.5* 9.3*  HCT 28.5* 28.6* 28.2* 28.4* 28.2* 27.5*  MCV 95.3 96.0 95.6 95.3 95.3 95.5  PLT 177 182 185 202 179 184    Recent Labs    06/10/21 0203  IRON 65  TIBC 267  FERRITIN 745*   Lab Results  Component Value Date   INR 1.8 (H) 05/12/2021   INR 1.1 04/29/2021   Studies/Results: No results found.  Medications:  ferric gluconate (FERRLECIT) IVPB 125 mg (06/11/21 1407)   heparin 600 Units/hr (06/12/21 0817)   promethazine (PHENERGAN) injection (IM or IVPB) 6.25 mg (06/11/21 2113)    amiodarone  200 mg Oral Daily   bisacodyl  10 mg Rectal Daily   Chlorhexidine Gluconate Cloth  6 each Topical Q0600   Chlorhexidine Gluconate Cloth  6 each Topical Q0600   darbepoetin (ARANESP) injection - DIALYSIS  60 mcg Intravenous Q Mon-HD   feeding supplement  237 mL Oral TID BM   hydrocortisone  25 mg Rectal BID   magnesium oxide  200  mg Oral Daily   midodrine  10 mg Oral TID WC   midodrine  10 mg Oral Q M,W,F-HD   multivitamin  1 tablet Oral QHS   polyethylene glycol  17 g Oral BID   senna-docusate  2 tablet Oral BID   sodium chloride flush  3 mL Intravenous Q12H    Dialysis Orders: MWF at [facility] 4hr, 400/500, EDW 72kg (although leaving 74kg range), 2K/2Ca, TDC (+ maturing AVF), heparin 2000 unit bolus - No ESA or VDRA   Assessment/Plan:  Rectal pain/constipation: On aggressive bowel regimen.  Disimpacted by GI w/large amount of stool removed. Having good BM. GI following.   Liver/kidney lesions: Liver MRI vs CT per GI.   Dysphagia: Swallow studies normal.  Elevated LFTs - trending up. Currently NPO. Per GI.   ESRD:  MWF.  HD today per regular schedule.   Hypotension/volume: Chronically low BP, on midodrine 10mg  TID. Ordered prn midodrine to be given pre HD. Minimal UF with HD yesterday due to hypotension.  Does not appear volume overloaded.  UF as tolerated.  Anemia: Hgb 9.3 - FOBT negative. Tsat 24%, IV iron ordered. ESA started.   Metabolic bone disease: CorrCa and  Phos high. Will need to start binder but holding off for now due to constipation.  Plan to start auryxia 1 AC TID once eating solids.   CAD/HFrEF - missed outpatient cardiology follow up today.  Will need to reschedule after d/c unless need to be seen as inpatient.  A-fib: On amiodarone Nutrition - NPO. Renal diet w/fluid restrictions once advanced.   Jen Mow, PA-C [facility] 06/12/2021,3:35 PM  LOS: 4 days

## 2021-06-12 NOTE — Progress Notes (Signed)
Mobility Specialist Progress Note:   06/12/21 1144  Mobility  Activity Ambulated with assistance in hallway  Level of Assistance Independent after set-up  Assistive Device Front wheel walker  Distance Ambulated (ft) 120 ft  Activity Response Tolerated well  $Mobility charge 1 Mobility   Pt received in bed willing to participate in mobility. No complaints of pain, did state his legs felt weak. Upon returning to room pt stated he needed to use BR. Pt left in bed with call bell in reach and all needs met.   [facility] Public librarian Phone [phone]

## 2021-06-12 NOTE — Progress Notes (Signed)
ANTICOAGULATION CONSULT NOTE  Pharmacy Consult for Heparin Indication: atrial fibrillation  Allergies  Allergen Reactions   Other Other (See Comments)    Pollen - sneezing, itching/watery eyes    Patient Measurements: Height: 5\' 11"  (180.3 cm) Weight: 72.1 kg (158 lb 15.2 oz) IBW/kg (Calculated) : 75.3 kg Heparin Dosing Weight: 78 kg  Vital Signs: Temp: 98.2 F (36.8 C) (02/08 0434) Temp Source: Oral (02/08 0434) BP: 120/63 (02/08 0434) Pulse Rate: 77 (02/08 0434)  Labs: Recent Labs    06/10/21 0203 06/10/21 0956 06/11/21 0649 06/12/21 0402  HGB 9.4*  --  9.5* 9.3*  HCT 28.4*  --  28.2* 27.5*  PLT 202  --  179 184  APTT 70* 61* 67* 81*  HEPARINUNFRC  --   --  >1.10* >1.10*  CREATININE 7.63*  --  5.19* 6.81*     Estimated Creatinine Clearance: 9.6 mL/min (A) (by C-G formula based on SCr of 6.81 mg/dL (H)).  Assessment: 76 yo male with a history of atrial fibrillation and CAD presents with nausea, vomiting, rectal pain, and worsening abdominal pain. PTA the patient is on apixaban, last dose on 2/3 in the evening (per the patient and family members). Pharmacy is consulted to dose heparin.  Of note, the patient had a recent admission in 04/2021 for NSTEMI, cardiogenic shock, acute systolic CHF, mitral regurgitation, and aortic valve insufficiency.   aPTT therapeutic; heparin level remains elevated due to Eliquis.  No issue with heparin infusion nor bleeding reported.  Goal of Therapy:  Heparin level 0.3-0.7 units/ml aPTT 66-102 seconds Monitor platelets by anticoagulation protocol: Yes   Plan:  Continue heparin gtt at 600 units/hr Daily heparin level, aPTT and CBC F/U with transitioning back to Eliquis once all procedures are completed   Kariyah Baugh D. Mina Marble, PharmD, BCPS, [place] 06/12/2021, 7:27 AM

## 2021-06-12 NOTE — Progress Notes (Addendum)
PROGRESS NOTE  Kyon Bentler OAC:[id number] DOB: [birth year] DOA: 06/07/2021 PCP: [name], FNP  HPI/Recap of past 24 hours: 76 y.o. male with medical history significant of hypertension, hyperlipidemia, ESRD on HD, CAD, recent hospitalization for non-STEMI and acute systolic CHF with cardiogenic shock requiring ICU stay and inotrope support, A. fib with RVR on Eliquis, prior alcohol abuse, and tobacco abuse who presents with complaints of rectal pain and difficulty swallowing over the last 2 weeks.  His most recent hospital stay was prolonged, he was here for almost a month.  He has declined since, with weight loss, poor p.o. intake, dysphagia as well as increased constipation.  There was concern for acute cholecystitis on admission and general surgery was consulted.  Initial imaging showed some nonspecific liver lesions, on a noncontrast study, and also patient complained of dysphagia for which GI was consulted.  Hospital course complicated by worsening elevated chemistries and nausea.  Plan for hemodialysis on 06/12/2021.  06/12/21:  Seen and examined.  Endorses RUQ abdominal pain and nausea this AM.  Assessment/Plan: Active Problems:   HLD (hyperlipidemia)   ESRD on hemodialysis (HCC)   Weight loss   Suspected acute cholecystitis   Rectal pain and constipation   Dysphagia   Paroxysmal atrial fibrillation (HCC)   Chronic systolic CHF (congestive heart failure) (HCC)   ETOH abuse   Normocytic anemia   Transaminitis   CAD (coronary artery disease)   Liver lesions  Rectal pain and constipation- (present on admission) -patient reports complaints of constipation and rectal pain for which she has been unable to have adequate bowel movement despite taking stool softeners, sitz bath's, use of Preparation H, and suppositories. CT scan of the abdomen pelvis did not note any signs of fecal impaction or bowel obstruction, but appears to show stool throughout the colon.  GI consulted,  status post manual disimpaction.  Continue aggressive bowel regimen, significantly improved, continues to have good bowel movements.  Worsening Transaminitis- (present on admission) -LFTs are up trending in the setting of nausea and right upper quadrant abdominal pain -N.p.o., may need HIDA scan, defer to GI, following.   Chronic systolic CHF (congestive heart failure) ([place])- (present on admission) -Following NSTEMI in January 2023 patient was noted to have gone into cardiogenic shock and required temporary stay in ICU requiring pressors which were able to be weaned off and he was transition to midodrine.  EF 35-40% on echocardiogram with moderate mitral regurgitation. Currently patient does not appear grossly fluid overloaded.  Continue midodrine, fluid management per dialysis. Continue strict I's and O's and daily weight.  No longer makes urine.   Liver lesions - CT scan on admission showed low-attenuation lesions measuring about 12 mm diameter in the liver.  Defer to GI, discussed with Dr. Collene Mares, she will talk to radiology.  Keep on heparin drip in case a biopsy is needed   CAD (coronary artery disease)- (present on admission) -Patient had recent NSTEMI 04/2021 for which he underwent cardiac catheterization noting RCA occlusion with diffuse disease of the LAD and left circumflex.  It was not amendable to PCI and he was not a surgical candidate for CABG. Continue statin   Paroxysmal atrial fibrillation ([place]) -Patient appears to be in sinus rhythm at this time and rate controlled. Held home Eliquis. Heparin drip per pharmacy for possibility of needed procedure. Continue amiodarone 200 mg daily   ESRD on hemodialysis (HCC)MWF -He has new end-stage renal disease following his most recent hospital stay.  He was discharged January  2023.  Currently MWF.  Just had an AV fistula placed, it has not matured and currently having dialysis via HD cath.   Hemodialysis on 06/12/2021.   Chronic normocytic  anemia/anemia of chronic disease- (present on admission) -Hemoglobin 9.3 from 9.5 No overt bleeding.   ETOH abuse -Patient has not not drank alcohol since he was admitted to the hospital in December 2022. Continue to encourage abstinence from alcohol   Dysphagia -Patient complains of food and liquids getting stuck in his throat causing him to vomit it back up.  Denies complaints of coughing while eating.  He has a prior history of alcohol abuse.  Management per gastroenterology   Suspected acute cholecystitis -Patient presented with complaints of abdominal pain with nausea and vomiting on admission.  CT scan of the abdomen pelvis significant for increased density in the gallbladder concerning for milk of calcium or large gallstones with gallbladder wall thickening.  General surgery has been consulted and evaluated patient, and they do not feel that imaging and physical exam and symptoms are consistent with acute cholecystitis.  They signed off.  He was initially placed on ceftriaxone but it has been discontinued now.   Weight loss -family reports 20-30 pound weight loss over the last month.  Question if multifactorial in the recent NSTEMI, ESRD, and constipation with poor p.o. intake.  TSH unremarkable.  Nonspecific liver lesions noted on the CT scan are concerning, GI to weigh in   HLD (hyperlipidemia)- (present on admission) -Hold off statin with uptrending liver chemistries     Code Status: Full Code   Family Communication: His wife at bedside.   Status is: Inpatient   Remains inpatient appropriate because: Severity of illness     Level of care: Telemetry Medical   Consultants:  Gastroenterology General surgery Nephrology   Procedures:  none   Microbiology  none    Objective: Vitals:   06/11/21 2120 06/12/21 0434 06/12/21 0500 06/12/21 0817  BP: 105/75 120/63  115/62  Pulse: 83 77  79  Resp: 18 16  15   Temp: 98.4 F (36.9 C) 98.2 F (36.8 C)    TempSrc: Oral  Oral    SpO2: 98% 96%  98%  Weight:   72.1 kg   Height:        Intake/Output Summary (Last 24 hours) at 06/12/2021 1216 Last data filed at 06/12/2021 1540 Gross per 24 hour  Intake 363.4 ml  Output --  Net 363.4 ml   Filed Weights   06/10/21 1818 06/11/21 0628 06/12/21 0500  Weight: 71.5 kg 71 kg 72.1 kg    Exam:  General: 76 y.o. year-old male well developed well nourished in no acute distress.  Alert and oriented x3. Cardiovascular: Regular rate and rhythm with no rubs or gallops.  No thyromegaly or JVD noted.   Respiratory: Clear to auscultation with no wheezes or rales. Good inspiratory effort. Abdomen: Soft mild right upper quadrant tenderness with moderate palpation.  Nondistended with normal bowel sounds x4 quadrants. Musculoskeletal: No lower extremity edema bilaterally. Skin: No ulcerative lesions noted or rashes. Psychiatry: Mood is appropriate for condition and setting Neuro: Moves all 4 extremities.   Data Reviewed: CBC: Recent Labs  Lab 06/07/21 1610 06/08/21 1157 06/09/21 0050 06/10/21 0203 06/11/21 0649 06/12/21 0402  WBC 7.0 7.2 9.4 10.3 9.8 8.9  NEUTROABS 5.4  --   --   --   --   --   HGB 9.4* 9.6* 9.1* 9.4* 9.5* 9.3*  HCT 28.5* 28.6* 28.2* 28.4* 28.2*  27.5*  MCV 95.3 96.0 95.6 95.3 95.3 95.5  PLT 177 182 185 202 179 295   Basic Metabolic Panel: Recent Labs  Lab 06/07/21 1610 06/08/21 1157 06/09/21 0050 06/10/21 0203 06/11/21 0649 06/12/21 0402  NA 140 141 140 134* 139 134*  K 3.6 4.2 4.4 4.8 4.1 4.3  CL 95* 96* 95* 92* 97* 95*  CO2 32 29 27 26 27 22   GLUCOSE 121* 123* 151* 132* 108* 98  BUN 10 25* 40* 69* 44* 63*  CREATININE 2.80* 4.91* 5.90* 7.63* 5.19* 6.81*  CALCIUM 8.3* 9.0 9.1 9.1 8.6* 8.8*  MG 1.9  --   --   --   --   --   PHOS  --  4.5 5.8* 7.9* 5.8* 6.6*   GFR: Estimated Creatinine Clearance: 9.6 mL/min (A) (by C-G formula based on SCr of 6.81 mg/dL (H)). Liver Function Tests: Recent Labs  Lab 06/07/21 1610 06/08/21 1157  06/09/21 0050 06/10/21 0203 06/11/21 0649 06/12/21 0402  AST 55*  --   --  113*  --  311*  ALT 32  --   --  78*  --  244*  ALKPHOS 90  --   --  126  --  196*  BILITOT 0.9  --   --  0.9  --  1.3*  PROT 7.1  --   --  6.1*  --  5.6*  ALBUMIN 3.3* 2.9* 3.0* 3.1* 2.9* 2.9*   Recent Labs  Lab 06/07/21 1610  LIPASE 39   No results for input(s): AMMONIA in the last 168 hours. Coagulation Profile: No results for input(s): INR, PROTIME in the last 168 hours. Cardiac Enzymes: No results for input(s): CKTOTAL, CKMB, CKMBINDEX, TROPONINI in the last 168 hours. BNP (last 3 results) No results for input(s): PROBNP in the last 8760 hours. HbA1C: No results for input(s): HGBA1C in the last 72 hours. CBG: No results for input(s): GLUCAP in the last 168 hours. Lipid Profile: No results for input(s): CHOL, HDL, LDLCALC, TRIG, CHOLHDL, LDLDIRECT in the last 72 hours. Thyroid Function Tests: No results for input(s): TSH, T4TOTAL, FREET4, T3FREE, THYROIDAB in the last 72 hours. Anemia Panel: Recent Labs    06/10/21 0203  VITAMINB12 660  FOLATE 15.3  FERRITIN 745*  TIBC 267  IRON 65   Urine analysis:    Component Value Date/Time   COLORURINE YELLOW 05/14/2021 1842   APPEARANCEUR CLEAR 05/14/2021 1842   LABSPEC >1.030 (H) 05/14/2021 1842   PHURINE 5.5 05/14/2021 1842   GLUCOSEU NEGATIVE 05/14/2021 1842   HGBUR NEGATIVE 05/14/2021 1842   BILIRUBINUR NEGATIVE 05/14/2021 1842   BILIRUBINUR negative 01/23/2020 1028   KETONESUR NEGATIVE 05/14/2021 1842   PROTEINUR NEGATIVE 05/14/2021 1842   UROBILINOGEN 1.0 01/23/2020 1028   NITRITE NEGATIVE 05/14/2021 1842   LEUKOCYTESUR NEGATIVE 05/14/2021 1842   Sepsis Labs: @LABRCNTIP (procalcitonin:4,lacticidven:4)  ) Recent Results (from the past 240 hour(s))  Resp Panel by RT-PCR (Flu A&B, Covid) Nasopharyngeal Swab     Status: None   Collection Time: 06/07/21  8:40 PM   Specimen: Nasopharyngeal Swab; Nasopharyngeal(NP) swabs in vial  transport medium  Result Value Ref Range Status   SARS Coronavirus 2 by RT PCR NEGATIVE NEGATIVE Final    Comment: (NOTE) SARS-CoV-2 target nucleic acids are NOT DETECTED.  The SARS-CoV-2 RNA is generally detectable in upper respiratory specimens during the acute phase of infection. The lowest concentration of SARS-CoV-2 viral copies this assay can detect is 138 copies/mL. A negative result does not preclude SARS-Cov-2 infection and  should not be used as the sole basis for treatment or other patient management decisions. A negative result may occur with  improper specimen collection/handling, submission of specimen other than nasopharyngeal swab, presence of viral mutation(s) within the areas targeted by this assay, and inadequate number of viral copies(<138 copies/mL). A negative result must be combined with clinical observations, patient history, and epidemiological information. The expected result is Negative.  Fact Sheet for Patients:  EntrepreneurPulse.com.au  Fact Sheet for Healthcare Providers:  IncredibleEmployment.be  This test is no t yet approved or cleared by the [place] FDA and  has been authorized for detection and/or diagnosis of SARS-CoV-2 by FDA under an Emergency Use Authorization (EUA). This EUA will remain  in effect (meaning this test can be used) for the duration of the COVID-19 declaration under Section 564(b)(1) of the Act, 21 U.S.C.section 360bbb-3(b)(1), unless the authorization is terminated  or revoked sooner.       Influenza A by PCR NEGATIVE NEGATIVE Final   Influenza B by PCR NEGATIVE NEGATIVE Final    Comment: (NOTE) The Xpert Xpress SARS-CoV-2/FLU/RSV plus assay is intended as an aid in the diagnosis of influenza from Nasopharyngeal swab specimens and should not be used as a sole basis for treatment. Nasal washings and aspirates are unacceptable for Xpert Xpress SARS-CoV-2/FLU/RSV testing.  Fact  Sheet for Patients: EntrepreneurPulse.com.au  Fact Sheet for Healthcare Providers: IncredibleEmployment.be  This test is not yet approved or cleared by the [place] FDA and has been authorized for detection and/or diagnosis of SARS-CoV-2 by FDA under an Emergency Use Authorization (EUA). This EUA will remain in effect (meaning this test can be used) for the duration of the COVID-19 declaration under Section 564(b)(1) of the Act, 21 U.S.C. section 360bbb-3(b)(1), unless the authorization is terminated or revoked.  Performed at [facility], [address] [place], [place] [address]       Studies: No results found.  Scheduled Meds:  amiodarone  200 mg Oral Daily   atorvastatin  80 mg Oral Daily   bisacodyl  10 mg Rectal Daily   Chlorhexidine Gluconate Cloth  6 each Topical Q0600   Chlorhexidine Gluconate Cloth  6 each Topical Q0600   darbepoetin (ARANESP) injection - DIALYSIS  60 mcg Intravenous Q Mon-HD   feeding supplement  237 mL Oral TID BM   hydrocortisone  25 mg Rectal BID   magnesium oxide  200 mg Oral Daily   midodrine  10 mg Oral TID WC   midodrine  10 mg Oral Q M,W,F-HD   multivitamin  1 tablet Oral QHS   polyethylene glycol  17 g Oral BID   senna-docusate  2 tablet Oral BID   sodium chloride flush  3 mL Intravenous Q12H    Continuous Infusions:  ferric gluconate (FERRLECIT) IVPB 125 mg (06/11/21 1407)   heparin 600 Units/hr (06/12/21 0817)   promethazine (PHENERGAN) injection (IM or IVPB) 6.25 mg (06/11/21 2113)     LOS: 4 days     Kayleen Memos, MD [facility] Pager [phone]  If 7PM-7AM, please contact night-coverage [email or web address] Password [facility] 06/12/2021, 12:16 PM

## 2021-06-13 LAB — CBC
HCT: 30.6 % — ABNORMAL LOW (ref 39.0–52.0)
Hemoglobin: 10.1 g/dL — ABNORMAL LOW (ref 13.0–17.0)
MCH: 32 pg (ref 26.0–34.0)
MCHC: 33 g/dL (ref 30.0–36.0)
MCV: 96.8 fL (ref 80.0–100.0)
Platelets: 165 10*3/uL (ref 150–400)
RBC: 3.16 MIL/uL — ABNORMAL LOW (ref 4.22–5.81)
RDW: 16 % — ABNORMAL HIGH (ref 11.5–15.5)
WBC: 8 10*3/uL (ref 4.0–10.5)
nRBC: 5.4 % — ABNORMAL HIGH (ref 0.0–0.2)

## 2021-06-13 LAB — RENAL FUNCTION PANEL
Albumin: 3 g/dL — ABNORMAL LOW (ref 3.5–5.0)
Anion gap: 15 (ref 5–15)
BUN: 44 mg/dL — ABNORMAL HIGH (ref 8–23)
CO2: 24 mmol/L (ref 22–32)
Calcium: 9.1 mg/dL (ref 8.9–10.3)
Chloride: 96 mmol/L — ABNORMAL LOW (ref 98–111)
Creatinine, Ser: 5.26 mg/dL — ABNORMAL HIGH (ref 0.61–1.24)
GFR, Estimated: 11 mL/min — ABNORMAL LOW (ref >60)
Glucose, Bld: 81 mg/dL (ref 70–99)
Phosphorus: 5.8 mg/dL — ABNORMAL HIGH (ref 2.5–4.6)
Potassium: 4.5 mmol/L (ref 3.5–5.1)
Sodium: 135 mmol/L (ref 135–145)

## 2021-06-13 LAB — APTT: aPTT: 112 seconds — ABNORMAL HIGH (ref 24–36)

## 2021-06-13 LAB — HEPATIC FUNCTION PANEL
ALT: 319 U/L — ABNORMAL HIGH (ref 0–44)
AST: 411 U/L — ABNORMAL HIGH (ref 15–41)
Albumin: 3.1 g/dL — ABNORMAL LOW (ref 3.5–5.0)
Alkaline Phosphatase: 287 U/L — ABNORMAL HIGH (ref 38–126)
Bilirubin, Direct: 0.8 mg/dL — ABNORMAL HIGH (ref 0.0–0.2)
Indirect Bilirubin: 1.2 mg/dL — ABNORMAL HIGH (ref 0.3–0.9)
Total Bilirubin: 2 mg/dL — ABNORMAL HIGH (ref 0.3–1.2)
Total Protein: 6.3 g/dL — ABNORMAL LOW (ref 6.5–8.1)

## 2021-06-13 LAB — PATHOLOGIST SMEAR REVIEW

## 2021-06-13 LAB — HEPARIN LEVEL (UNFRACTIONATED): Heparin Unfractionated: >1.1 IU/mL — ABNORMAL HIGH (ref 0.30–0.70)

## 2021-06-13 MED ORDER — FERRIC CITRATE 1 GM 210 MG(FE) PO TABS
210.0000 mg | ORAL_TABLET | Freq: Three times a day (TID) | ORAL | Status: DC
  Administered 2021-06-13 – 2021-06-19 (×14): 210 mg via ORAL
  Filled 2021-06-13 (×18): qty 1

## 2021-06-13 MED ORDER — FLEET ENEMA 7-19 GM/118ML RE ENEM
1.0000 | ENEMA | Freq: Once | RECTAL | Status: AC
  Administered 2021-06-14: 1 via RECTAL
  Filled 2021-06-13: qty 1

## 2021-06-13 NOTE — Progress Notes (Signed)
The patient is injury-free, afebrile, alert, and oriented X 3. Vital signs were within the baseline during this shift. He complained of rectal pain which controlled with current pain regiment. Pt denies chest pain, SOB, nausea, vomiting, dizziness, signs or symptoms of bleeding or infection, or acute changes during this shift. We will continue to monitor and work toward achieving the care plan goals.

## 2021-06-13 NOTE — Progress Notes (Signed)
Mobility Specialist Progress Note:   06/13/21 1318  Mobility  Activity Ambulated with assistance in hallway  Level of Assistance Standby assist, set-up cues, supervision of patient - no hands on  Assistive Device Front wheel walker  Distance Ambulated (ft) 400 ft  Activity Response Tolerated fair  $Mobility charge 1 Mobility   Pt received in bed willing to participate in mobility. No complaints of pain but pt stated his legs are weak. Left in bed with call bell in reach and all needs met.   [facility] Public librarian Phone [phone]

## 2021-06-13 NOTE — Progress Notes (Addendum)
[facility] Progress Note   Subjective:   Patient seen and examined at bedside.  Reports rectal pain and nausea this AM.  States increased work of breathing last night but improved this AM.  CT scan showed increasing volume.  Discussed increasing goal with HD tomorrow, patient agreed.   Objective Vitals:   06/12/21 1554 06/12/21 2136 06/13/21 0453 06/13/21 0811  BP: (!) 115/59 115/65 122/69 115/61  Pulse: 76 94 (!) 106 80  Resp: 18 20 16 18   Temp: 97.9 F (36.6 C) 97.7 F (36.5 C) 97.6 F (36.4 C) 98.4 F (36.9 C)  TempSrc: Oral Oral    SpO2: 100% 100% 91% 92%  Weight:      Height:       Physical Exam General:chronically ill appearing male in NAD Heart:RRR, no mrg Lungs:CTAB, nml WOB on 2L via [place] Abdomen:soft, NTND Extremities:no LE edema Dialysis Access: TDC, LU AVF maturing +b/t  Filed Weights   06/11/21 0628 06/12/21 0500 06/12/21 1519  Weight: 71 kg 72.1 kg 70.1 kg    Intake/Output Summary (Last 24 hours) at 06/13/2021 1307 Last data filed at 06/12/2021 1800 Gross per 24 hour  Intake 250 ml  Output 2000 ml  Net -1750 ml    Additional Objective Labs: Basic Metabolic Panel: Recent Labs  Lab 06/11/21 0649 06/12/21 0402 06/13/21 0115  NA 139 134* 135  K 4.1 4.3 4.5  CL 97* 95* 96*  CO2 27 22 24   GLUCOSE 108* 98 81  BUN 44* 63* 44*  CREATININE 5.19* 6.81* 5.26*  CALCIUM 8.6* 8.8* 9.1  PHOS 5.8* 6.6* 5.8*   Liver Function Tests: Recent Labs  Lab 06/07/21 1610 06/08/21 1157 06/10/21 0203 06/11/21 0649 06/12/21 0402 06/13/21 0115  AST 55*  --  113*  --  311*  --   ALT 32  --  78*  --  244*  --   ALKPHOS 90  --  126  --  196*  --   BILITOT 0.9  --  0.9  --  1.3*  --   PROT 7.1  --  6.1*  --  5.6*  --   ALBUMIN 3.3*   < > 3.1* 2.9* 2.9* 3.0*   < > = values in this interval not displayed.   Recent Labs  Lab 06/07/21 1610  LIPASE 39   CBC: Recent Labs  Lab 06/07/21 1610 06/08/21 1157 06/09/21 0050 06/10/21 0203  06/11/21 0649 06/12/21 0402 06/13/21 0115  WBC 7.0   < > 9.4 10.3 9.8 8.9 8.0  NEUTROABS 5.4  --   --   --   --   --   --   HGB 9.4*   < > 9.1* 9.4* 9.5* 9.3* 10.1*  HCT 28.5*   < > 28.2* 28.4* 28.2* 27.5* 30.6*  MCV 95.3   < > 95.6 95.3 95.3 95.5 96.8  PLT 177   < > 185 202 179 184 165   < > = values in this interval not displayed.   Blood Culture    Component Value Date/Time   SDES BLOOD RIGHT HAND 05/01/2021 0949   SPECREQUEST  05/01/2021 0949    BOTTLES DRAWN AEROBIC AND ANAEROBIC Blood Culture results may not be optimal due to an inadequate volume of blood received in culture bottles   CULT  05/01/2021 0949    NO GROWTH 5 DAYS Performed at [facility], [place] [address] [place], [place] [address]    REPTSTATUS 05/06/2021 FINAL 05/01/2021 [phone]  Studies/Results: CT ABDOMEN PELVIS W CONTRAST  Result Date: 06/12/2021 CLINICAL DATA:  Indeterminate liver lesions, with mildly prominent retrocrural lymph nodes on noncontrast CT. Study is performed as metastatic disease evaluation. EXAM: CT ABDOMEN AND PELVIS WITH CONTRAST TECHNIQUE: Multidetector CT imaging of the abdomen and pelvis was performed using the standard protocol following bolus administration of intravenous contrast. RADIATION DOSE REDUCTION: This exam was performed according to the departmental dose-optimization program which includes automated exposure control, adjustment of the mA and/or kV according to patient size and/or use of iterative reconstruction technique. CONTRAST:  62mL OMNIPAQUE IOHEXOL 300 MG/ML  SOLN COMPARISON:  Noncontrast abdomen and pelvis CTs recently 06/07/2021 and 06/02/2021 FINDINGS: Lower chest: Small to moderate increased right pleural effusion with extension into the major fissure. Unchanged trace left pleural fluid. There is compressive atelectasis or consolidation in the right lower lobe adjacent the effusion. Increased linear atelectasis right middle lobe. Moderate slightly worsened panchamber  cardiomegaly. There are distended central pulmonary veins, three-vessel calcific CAD and a dialysis catheter terminating in the upper right atrium. There is IVC and hepatic vein reflux indicating elevated right heart pressures. Also the pulmonary trunk is prominent at 3.5 cm indicating arterial hypertension, the ascending aorta borderline aneurysmal at 3.9 cm. Hepatobiliary: Again noted are 2 irregular low-density lesions in the left lobe of the liver, with a 1.4 cm lesion near the dome and a 1.3 cm lesion in the lateral segment. There is an additional indeterminate low-density lesion along the intersegmental division measuring 1.5 cm. These lesions measure above the usual density of fluid. There are additional too small to characterize scattered hypodensities in the liver elsewhere . The larger lesions remain indeterminate and certainly could represent metastases. Liver is 19.5 cm length mildly steatotic and otherwise unremarkable. There is dense contrast in the gallbladder, prior studies showing layering sludge or gravel. There is mild thickening of the free wall which was noted on both prior studies and likely is congestive with trace pericholecystic fluid and small-volume perihepatic ascites which is increased. Pancreas: Unremarkable. Spleen: Unremarkable. Adrenals/Urinary Tract: There is slight nodular thickening of both adrenal glands. There are small bilateral renal cysts, on right largest is 1.9 cm in 15.9 Hounsfield units in the superior pole laterally. On the left largest is in the anterior hilar cortex measuring 1.9 cm and 25 Hounsfield units and does not show any change in density between the noncontrast studies and today's arterial phase images and venous delayed phase images. There are multiple additional tiny hypodensities in both kidneys which are too small to characterize. There are scattered renal vascular calcifications at the renal hila but there are no collecting system stones, hydronephrosis or  findings of acute pyelonephritis. There is mild generalized bladder thickening versus changes of underdistention. The bladder was normal in thickness on the last CT. Stomach/Bowel: No dilatation or wall thickening through the sigmoid segment including the contrast filled appendix. There are left colonic diverticula without evidence of acute diverticulitis. Moderate fold thickening does appear present in the rectum, which was previously stool-filled. Vascular/Lymphatic: There is moderate to heavy mixed aortoiliac plaque disease. There are short-segment dissections in both common iliac arteries but there is no aortic dissection or aneurysm. Some of the soft aortic plaque is ulcerative but no penetrating aortic ulcer or inflammatory changes are seen. Soft plaque causes up to 75-80% luminal stenosis in the distal left external iliac artery. 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 The visualized proximal superficial femoral arteries and common femoral arteries  patent. The major visceral arteries are likewise patent. As described previously there are several slightly prominent retrocrural lymph nodes, largest measuring 10 mm in short axis. No further adenopathy is seen. Reproductive: Moderately enlarged prostate. Other: There is mild but increased body wall and mesenteric congestive features. There is small-volume abdominal and pelvic free ascites, but not sufficient for paracentesis. There is no free air or incarcerated hernia. Musculoskeletal: There are chronic lesions of osteonecrosis in both superior femoral heads, without deformity of the femoral head articular surfaces. There are dense bones which may suggest renal osteodystrophy. There are degenerative changes of the lumbar spine. No worrisome regional bone lesion is seen. Bridging osteophytes anterior left SI joint. IMPRESSION: 1. 3 small indeterminate lesions in the liver above the usual density of fluid. All 3 are in the left  lobe, and additional scattered too small to characterize hypodensities elsewhere. Liver dedicated MRI is recommended. 2. Small renal cysts and additional bilateral too small to characterize cortical hypodensities. Attention to this on MRI recommended. 3. Slightly prominent retrocrural lymph nodes but no further adenopathy is seen. 4. Cardiomegaly with evidence of arterial hypertension, and IVC reflux consistent with elevated right heart pressures. 5. Aortic atherosclerosis. Borderline aneurysmal ectasia ascending thoracic aorta with moderate/heavy mixed abdominal aortic/iliac plaque. Some of aortic soft plaque is ulcerative but no penetrating ulcer is seen. 6. 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 7. Moderate fold thickening in the rectum. This could be due to proctitis, muscular hypertrophy or infiltrating disease. Further evaluation recommended. 8. Mild bladder thickening versus underdistention. 9. Increased right pleural effusion with adjacent consolidation or atelectasis, increased congestive changes in the body wall and mesentery and increased small-volume abdominal and pelvic ascites. 10. Mild gallbladder thickening, likely congestive. Correlate clinically for less likely cholecystitis. 11. Prostatomegaly and remaining findings described above. Electronically Signed   By: Telford Nab M.D.   On: 06/12/2021 23:07    Medications:  ferric gluconate (FERRLECIT) IVPB 125 mg (06/13/21 1022)   heparin 550 Units/hr (06/13/21 1022)   promethazine (PHENERGAN) injection (IM or IVPB) 6.25 mg (06/11/21 2113)    bisacodyl  10 mg Rectal Daily   Chlorhexidine Gluconate Cloth  6 each Topical Q0600   Chlorhexidine Gluconate Cloth  6 each Topical Q0600   darbepoetin (ARANESP) injection - DIALYSIS  60 mcg Intravenous Q Mon-HD   feeding supplement  237 mL Oral  TID BM   hydrocortisone  25 mg Rectal BID   magnesium oxide  200 mg Oral Daily   midodrine  10 mg Oral TID WC   midodrine  10 mg Oral Q M,W,F-HD   multivitamin  1 tablet Oral QHS   polyethylene glycol  17 g Oral BID   senna-docusate  2 tablet Oral BID   sodium chloride flush  3 mL Intravenous Q12H   [START ON 06/14/2021] sodium phosphate  1 enema Rectal Once    Dialysis Orders: MWF at [facility] 4hr, 400/500, EDW 72kg (although leaving 74kg range), 2K/2Ca, TDC (+ maturing AVF), heparin 2000 unit bolus - No ESA or VDRA   Assessment/Plan:  Rectal pain/constipation: On aggressive bowel regimen.  Disimpacted by GI w/large amount of stool removed. Having good BM. CT yesterday with proctitis. GI following.   Liver/kidney lesions: CT completed yesterday and recommending MRI to further characterize liver lesions.  Dysphagia: Swallow studies normal.  Elevated LFTs - trending up. Currently  NPO. Per GI.   ESRD:  MWF.  HD tomorrow per regular schedule.   Hypotension/volume: Chronically low BP, on midodrine 10mg  TID. Ordered prn midodrine to be given pre HD. Increasing volume noted on CT.  Increase goal for HD tomorrow.  Anemia: Hgb 9.1 - FOBT negative. Tsat 24%, IV iron ordered. ESA started.   Metabolic bone disease: CorrCa and Phos high. Will need to start binder but holding off for now due to constipation/abdominal pain.  Plan to start auryxia 1 AC TID.   CAD/HFrEF - missed outpatient cardiology follow up today.  Will need to reschedule after d/c unless need to be seen as inpatient.  A-fib: On amiodarone Nutrition - Renal diet w/fluid restrictions once advanced.   Jen Mow, PA-C [facility] 06/13/2021,1:07 PM  LOS: 5 days   Pt seen, examined and agree w A/P as above.  Kelly Splinter  MD 06/13/2021, 4:04 PM

## 2021-06-13 NOTE — H&P (View-Only) (Signed)
Subjective: Rectal pain is better with pain medications.  Objective: Vital signs in last 24 hours: Temp:  [97.5 F (36.4 C)-98.4 F (36.9 C)] 98.4 F (36.9 C) (02/09 0811) Pulse Rate:  [76-106] 80 (02/09 0811) Resp:  [16-26] 18 (02/09 0811) BP: (115-122)/(59-69) 115/61 (02/09 0811) SpO2:  [91 %-100 %] 92 % (02/09 0811) Weight:  [70.1 kg] 70.1 kg (02/08 1519) Last BM Date: 06/13/21  Intake/Output from previous day: 02/08 0701 - 02/09 0700 In: 347.5 [P.O.:250; I.V.:97.5] Out: 2000  Intake/Output this shift: No intake/output data recorded.  General appearance: alert Resp: clear to auscultation bilaterally Cardio: regular rate and rhythm GI: soft, non-tender; bowel sounds normal; no masses,  no organomegaly Extremities: extremities normal, atraumatic, no cyanosis or edema  Lab Results: Recent Labs    06/11/21 0649 06/12/21 0402 06/13/21 0115  WBC 9.8 8.9 8.0  HGB 9.5* 9.3* 10.1*  HCT 28.2* 27.5* 30.6*  PLT 179 184 165   BMET Recent Labs    06/11/21 0649 06/12/21 0402 06/13/21 0115  NA 139 134* 135  K 4.1 4.3 4.5  CL 97* 95* 96*  CO2 27 22 24   GLUCOSE 108* 98 81  BUN 44* 63* 44*  CREATININE 5.19* 6.81* 5.26*  CALCIUM 8.6* 8.8* 9.1   LFT Recent Labs    06/12/21 0402 06/13/21 0115  PROT 5.6*  --   ALBUMIN 2.9* 3.0*  AST 311*  --   ALT 244*  --   ALKPHOS 196*  --   BILITOT 1.3*  --    PT/INR No results for input(s): LABPROT, INR in the last 72 hours. Hepatitis Panel No results for input(s): HEPBSAG, HCVAB, HEPAIGM, HEPBIGM in the last 72 hours. C-Diff No results for input(s): CDIFFTOX in the last 72 hours. Fecal Lactopherrin No results for input(s): FECLLACTOFRN in the last 72 hours.  Studies/Results: CT ABDOMEN PELVIS W CONTRAST  Result Date: 06/12/2021 CLINICAL DATA:  Indeterminate liver lesions, with mildly prominent retrocrural lymph nodes on noncontrast CT. Study is performed as metastatic disease evaluation. EXAM: CT ABDOMEN AND PELVIS WITH  CONTRAST TECHNIQUE: Multidetector CT imaging of the abdomen and pelvis was performed using the standard protocol following bolus administration of intravenous contrast. RADIATION DOSE REDUCTION: This exam was performed according to the departmental dose-optimization program which includes automated exposure control, adjustment of the mA and/or kV according to patient size and/or use of iterative reconstruction technique. CONTRAST:  71mL OMNIPAQUE IOHEXOL 300 MG/ML  SOLN COMPARISON:  Noncontrast abdomen and pelvis CTs recently 06/07/2021 and 06/02/2021 FINDINGS: Lower chest: Small to moderate increased right pleural effusion with extension into the major fissure. Unchanged trace left pleural fluid. There is compressive atelectasis or consolidation in the right lower lobe adjacent the effusion. Increased linear atelectasis right middle lobe. Moderate slightly worsened panchamber cardiomegaly. There are distended central pulmonary veins, three-vessel calcific CAD and a dialysis catheter terminating in the upper right atrium. There is IVC and hepatic vein reflux indicating elevated right heart pressures. Also the pulmonary trunk is prominent at 3.5 cm indicating arterial hypertension, the ascending aorta borderline aneurysmal at 3.9 cm. Hepatobiliary: Again noted are 2 irregular low-density lesions in the left lobe of the liver, with a 1.4 cm lesion near the dome and a 1.3 cm lesion in the lateral segment. There is an additional indeterminate low-density lesion along the intersegmental division measuring 1.5 cm. These lesions measure above the usual density of fluid. There are additional too small to characterize scattered hypodensities in the liver elsewhere . The larger lesions remain indeterminate  and certainly could represent metastases. Liver is 19.5 cm length mildly steatotic and otherwise unremarkable. There is dense contrast in the gallbladder, prior studies showing layering sludge or gravel. There is mild  thickening of the free wall which was noted on both prior studies and likely is congestive with trace pericholecystic fluid and small-volume perihepatic ascites which is increased. Pancreas: Unremarkable. Spleen: Unremarkable. Adrenals/Urinary Tract: There is slight nodular thickening of both adrenal glands. There are small bilateral renal cysts, on right largest is 1.9 cm in 15.9 Hounsfield units in the superior pole laterally. On the left largest is in the anterior hilar cortex measuring 1.9 cm and 25 Hounsfield units and does not show any change in density between the noncontrast studies and today's arterial phase images and venous delayed phase images. There are multiple additional tiny hypodensities in both kidneys which are too small to characterize. There are scattered renal vascular calcifications at the renal hila but there are no collecting system stones, hydronephrosis or findings of acute pyelonephritis. There is mild generalized bladder thickening versus changes of underdistention. The bladder was normal in thickness on the last CT. Stomach/Bowel: No dilatation or wall thickening through the sigmoid segment including the contrast filled appendix. There are left colonic diverticula without evidence of acute diverticulitis. Moderate fold thickening does appear present in the rectum, which was previously stool-filled. Vascular/Lymphatic: There is moderate to heavy mixed aortoiliac plaque disease. There are short-segment dissections in both common iliac arteries but there is no aortic dissection or aneurysm. Some of the soft aortic plaque is ulcerative but no penetrating aortic ulcer or inflammatory changes are seen. Soft plaque causes up to 75-80% luminal stenosis in the distal left external iliac artery. 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 The visualized proximal superficial femoral arteries and common femoral arteries patent. The major visceral arteries are likewise  patent. As described previously there are several slightly prominent retrocrural lymph nodes, largest measuring 10 mm in short axis. No further adenopathy is seen. Reproductive: Moderately enlarged prostate. Other: There is mild but increased body wall and mesenteric congestive features. There is small-volume abdominal and pelvic free ascites, but not sufficient for paracentesis. There is no free air or incarcerated hernia. Musculoskeletal: There are chronic lesions of osteonecrosis in both superior femoral heads, without deformity of the femoral head articular surfaces. There are dense bones which may suggest renal osteodystrophy. There are degenerative changes of the lumbar spine. No worrisome regional bone lesion is seen. Bridging osteophytes anterior left SI joint. IMPRESSION: 1. 3 small indeterminate lesions in the liver above the usual density of fluid. All 3 are in the left lobe, and additional scattered too small to characterize hypodensities elsewhere. Liver dedicated MRI is recommended. 2. Small renal cysts and additional bilateral too small to characterize cortical hypodensities. Attention to this on MRI recommended. 3. Slightly prominent retrocrural lymph nodes but no further adenopathy is seen. 4. Cardiomegaly with evidence of arterial hypertension, and IVC reflux consistent with elevated right heart pressures. 5. Aortic atherosclerosis. Borderline aneurysmal ectasia ascending thoracic aorta with moderate/heavy mixed abdominal aortic/iliac plaque. Some of aortic soft plaque is ulcerative but no penetrating ulcer is seen. 6. 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 7. Moderate fold thickening in the rectum. This could be due to proctitis, muscular hypertrophy or infiltrating disease. Further evaluation recommended. 8. Mild bladder  thickening versus  underdistention. 9. Increased right pleural effusion with adjacent consolidation or atelectasis, increased congestive changes in the body wall and mesentery and increased small-volume abdominal and pelvic ascites. 10. Mild gallbladder thickening, likely congestive. Correlate clinically for less likely cholecystitis. 11. Prostatomegaly and remaining findings described above. Electronically Signed   By: Telford Nab M.D.   On: 06/12/2021 23:07    Medications: Scheduled:  bisacodyl  10 mg Rectal Daily   Chlorhexidine Gluconate Cloth  6 each Topical Q0600   Chlorhexidine Gluconate Cloth  6 each Topical Q0600   darbepoetin (ARANESP) injection - DIALYSIS  60 mcg Intravenous Q Mon-HD   feeding supplement  237 mL Oral TID BM   ferric citrate  210 mg Oral TID WC   hydrocortisone  25 mg Rectal BID   magnesium oxide  200 mg Oral Daily   midodrine  10 mg Oral TID WC   midodrine  10 mg Oral Q M,W,F-HD   multivitamin  1 tablet Oral QHS   polyethylene glycol  17 g Oral BID   senna-docusate  2 tablet Oral BID   sodium chloride flush  3 mL Intravenous Q12H   [START ON 06/14/2021] sodium phosphate  1 enema Rectal Once   Continuous:  ferric gluconate (FERRLECIT) IVPB 125 mg (06/13/21 1022)   heparin 550 Units/hr (06/13/21 1022)   promethazine (PHENERGAN) injection (IM or IVPB) 6.25 mg (06/11/21 2113)    Assessment/Plan: 1) Rectal thickening on CT scan. 2) Indeterminant lesions on the CT scan of the liver. 3) Hepatomegaly and steatosis. 4) CHF and cardiomegaly. 5) Elevated liver enzymes.   The patient will have further evaluation of his rectum with a FFS tomorrow.  The suspicion is that he may have a solitary rectal ulcer from his recent fecal impaction.  The liver lesions were no further characterized with the CT scan and an MRI was recommended.  He was found to have elevated liver enzymes and this may be from his CHF.  Hepatojugular reflux was noted on examination today.  No evidence of any stones on  the CT scan.  Plan: 1) MRI of the liver. 2) FFS tomorrow. 3) CHF per Hospitalist.  LOS: 5 days   Keigan Girten D 06/13/2021, 2:39 PM

## 2021-06-13 NOTE — Progress Notes (Signed)
Subjective: Rectal pain is better with pain medications.  Objective: Vital signs in last 24 hours: Temp:  [97.5 F (36.4 C)-98.4 F (36.9 C)] 98.4 F (36.9 C) (02/09 0811) Pulse Rate:  [76-106] 80 (02/09 0811) Resp:  [16-26] 18 (02/09 0811) BP: (115-122)/(59-69) 115/61 (02/09 0811) SpO2:  [91 %-100 %] 92 % (02/09 0811) Weight:  [70.1 kg] 70.1 kg (02/08 1519) Last BM Date: 06/13/21  Intake/Output from previous day: 02/08 0701 - 02/09 0700 In: 347.5 [P.O.:250; I.V.:97.5] Out: 2000  Intake/Output this shift: No intake/output data recorded.  General appearance: alert Resp: clear to auscultation bilaterally Cardio: regular rate and rhythm GI: soft, non-tender; bowel sounds normal; no masses,  no organomegaly Extremities: extremities normal, atraumatic, no cyanosis or edema  Lab Results: Recent Labs    06/11/21 0649 06/12/21 0402 06/13/21 0115  WBC 9.8 8.9 8.0  HGB 9.5* 9.3* 10.1*  HCT 28.2* 27.5* 30.6*  PLT 179 184 165   BMET Recent Labs    06/11/21 0649 06/12/21 0402 06/13/21 0115  NA 139 134* 135  K 4.1 4.3 4.5  CL 97* 95* 96*  CO2 27 22 24   GLUCOSE 108* 98 81  BUN 44* 63* 44*  CREATININE 5.19* 6.81* 5.26*  CALCIUM 8.6* 8.8* 9.1   LFT Recent Labs    06/12/21 0402 06/13/21 0115  PROT 5.6*  --   ALBUMIN 2.9* 3.0*  AST 311*  --   ALT 244*  --   ALKPHOS 196*  --   BILITOT 1.3*  --    PT/INR No results for input(s): LABPROT, INR in the last 72 hours. Hepatitis Panel No results for input(s): HEPBSAG, HCVAB, HEPAIGM, HEPBIGM in the last 72 hours. C-Diff No results for input(s): CDIFFTOX in the last 72 hours. Fecal Lactopherrin No results for input(s): FECLLACTOFRN in the last 72 hours.  Studies/Results: CT ABDOMEN PELVIS W CONTRAST  Result Date: 06/12/2021 CLINICAL DATA:  Indeterminate liver lesions, with mildly prominent retrocrural lymph nodes on noncontrast CT. Study is performed as metastatic disease evaluation. EXAM: CT ABDOMEN AND PELVIS WITH  CONTRAST TECHNIQUE: Multidetector CT imaging of the abdomen and pelvis was performed using the standard protocol following bolus administration of intravenous contrast. RADIATION DOSE REDUCTION: This exam was performed according to the departmental dose-optimization program which includes automated exposure control, adjustment of the mA and/or kV according to patient size and/or use of iterative reconstruction technique. CONTRAST:  79mL OMNIPAQUE IOHEXOL 300 MG/ML  SOLN COMPARISON:  Noncontrast abdomen and pelvis CTs recently 06/07/2021 and 06/02/2021 FINDINGS: Lower chest: Small to moderate increased right pleural effusion with extension into the major fissure. Unchanged trace left pleural fluid. There is compressive atelectasis or consolidation in the right lower lobe adjacent the effusion. Increased linear atelectasis right middle lobe. Moderate slightly worsened panchamber cardiomegaly. There are distended central pulmonary veins, three-vessel calcific CAD and a dialysis catheter terminating in the upper right atrium. There is IVC and hepatic vein reflux indicating elevated right heart pressures. Also the pulmonary trunk is prominent at 3.5 cm indicating arterial hypertension, the ascending aorta borderline aneurysmal at 3.9 cm. Hepatobiliary: Again noted are 2 irregular low-density lesions in the left lobe of the liver, with a 1.4 cm lesion near the dome and a 1.3 cm lesion in the lateral segment. There is an additional indeterminate low-density lesion along the intersegmental division measuring 1.5 cm. These lesions measure above the usual density of fluid. There are additional too small to characterize scattered hypodensities in the liver elsewhere . The larger lesions remain indeterminate  and certainly could represent metastases. Liver is 19.5 cm length mildly steatotic and otherwise unremarkable. There is dense contrast in the gallbladder, prior studies showing layering sludge or gravel. There is mild  thickening of the free wall which was noted on both prior studies and likely is congestive with trace pericholecystic fluid and small-volume perihepatic ascites which is increased. Pancreas: Unremarkable. Spleen: Unremarkable. Adrenals/Urinary Tract: There is slight nodular thickening of both adrenal glands. There are small bilateral renal cysts, on right largest is 1.9 cm in 15.9 Hounsfield units in the superior pole laterally. On the left largest is in the anterior hilar cortex measuring 1.9 cm and 25 Hounsfield units and does not show any change in density between the noncontrast studies and today's arterial phase images and venous delayed phase images. There are multiple additional tiny hypodensities in both kidneys which are too small to characterize. There are scattered renal vascular calcifications at the renal hila but there are no collecting system stones, hydronephrosis or findings of acute pyelonephritis. There is mild generalized bladder thickening versus changes of underdistention. The bladder was normal in thickness on the last CT. Stomach/Bowel: No dilatation or wall thickening through the sigmoid segment including the contrast filled appendix. There are left colonic diverticula without evidence of acute diverticulitis. Moderate fold thickening does appear present in the rectum, which was previously stool-filled. Vascular/Lymphatic: There is moderate to heavy mixed aortoiliac plaque disease. There are short-segment dissections in both common iliac arteries but there is no aortic dissection or aneurysm. Some of the soft aortic plaque is ulcerative but no penetrating aortic ulcer or inflammatory changes are seen. Soft plaque causes up to 75-80% luminal stenosis in the distal left external iliac artery. 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 The visualized proximal superficial femoral arteries and common femoral arteries patent. The major visceral arteries are likewise  patent. As described previously there are several slightly prominent retrocrural lymph nodes, largest measuring 10 mm in short axis. No further adenopathy is seen. Reproductive: Moderately enlarged prostate. Other: There is mild but increased body wall and mesenteric congestive features. There is small-volume abdominal and pelvic free ascites, but not sufficient for paracentesis. There is no free air or incarcerated hernia. Musculoskeletal: There are chronic lesions of osteonecrosis in both superior femoral heads, without deformity of the femoral head articular surfaces. There are dense bones which may suggest renal osteodystrophy. There are degenerative changes of the lumbar spine. No worrisome regional bone lesion is seen. Bridging osteophytes anterior left SI joint. IMPRESSION: 1. 3 small indeterminate lesions in the liver above the usual density of fluid. All 3 are in the left lobe, and additional scattered too small to characterize hypodensities elsewhere. Liver dedicated MRI is recommended. 2. Small renal cysts and additional bilateral too small to characterize cortical hypodensities. Attention to this on MRI recommended. 3. Slightly prominent retrocrural lymph nodes but no further adenopathy is seen. 4. Cardiomegaly with evidence of arterial hypertension, and IVC reflux consistent with elevated right heart pressures. 5. Aortic atherosclerosis. Borderline aneurysmal ectasia ascending thoracic aorta with moderate/heavy mixed abdominal aortic/iliac plaque. Some of aortic soft plaque is ulcerative but no penetrating ulcer is seen. 6. 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 7. Moderate fold thickening in the rectum. This could be due to proctitis, muscular hypertrophy or infiltrating disease. Further evaluation recommended. 8. Mild bladder  thickening versus  underdistention. 9. Increased right pleural effusion with adjacent consolidation or atelectasis, increased congestive changes in the body wall and mesentery and increased small-volume abdominal and pelvic ascites. 10. Mild gallbladder thickening, likely congestive. Correlate clinically for less likely cholecystitis. 11. Prostatomegaly and remaining findings described above. Electronically Signed   By: Telford Nab M.D.   On: 06/12/2021 23:07    Medications: Scheduled:  bisacodyl  10 mg Rectal Daily   Chlorhexidine Gluconate Cloth  6 each Topical Q0600   Chlorhexidine Gluconate Cloth  6 each Topical Q0600   darbepoetin (ARANESP) injection - DIALYSIS  60 mcg Intravenous Q Mon-HD   feeding supplement  237 mL Oral TID BM   ferric citrate  210 mg Oral TID WC   hydrocortisone  25 mg Rectal BID   magnesium oxide  200 mg Oral Daily   midodrine  10 mg Oral TID WC   midodrine  10 mg Oral Q M,W,F-HD   multivitamin  1 tablet Oral QHS   polyethylene glycol  17 g Oral BID   senna-docusate  2 tablet Oral BID   sodium chloride flush  3 mL Intravenous Q12H   [START ON 06/14/2021] sodium phosphate  1 enema Rectal Once   Continuous:  ferric gluconate (FERRLECIT) IVPB 125 mg (06/13/21 1022)   heparin 550 Units/hr (06/13/21 1022)   promethazine (PHENERGAN) injection (IM or IVPB) 6.25 mg (06/11/21 2113)    Assessment/Plan: 1) Rectal thickening on CT scan. 2) Indeterminant lesions on the CT scan of the liver. 3) Hepatomegaly and steatosis. 4) CHF and cardiomegaly. 5) Elevated liver enzymes.   The patient will have further evaluation of his rectum with a FFS tomorrow.  The suspicion is that he may have a solitary rectal ulcer from his recent fecal impaction.  The liver lesions were no further characterized with the CT scan and an MRI was recommended.  He was found to have elevated liver enzymes and this may be from his CHF.  Hepatojugular reflux was noted on examination today.  No evidence of any stones on  the CT scan.  Plan: 1) MRI of the liver. 2) FFS tomorrow. 3) CHF per Hospitalist.  LOS: 5 days   Ruben [name] D 06/13/2021, 2:39 PM

## 2021-06-13 NOTE — Plan of Care (Signed)
°  Problem: Education: Goal: Knowledge of General Education information will improve Description: Including pain rating scale, medication(s)/side effects and non-pharmacologic comfort measures Outcome: Progressing   Problem: Health Behavior/Discharge Planning: Goal: Ability to manage health-related needs will improve Outcome: Not Progressing   Problem: Clinical Measurements: Goal: Ability to maintain clinical measurements within normal limits will improve Outcome: Progressing Goal: Will remain free from infection Outcome: Progressing Goal: Diagnostic test results will improve Outcome: Progressing Goal: Respiratory complications will improve Outcome: Progressing Goal: Cardiovascular complication will be avoided Outcome: Progressing   Problem: Elimination: Goal: Will not experience complications related to bowel motility Outcome: Progressing   Problem: Safety: Goal: Ability to remain free from injury will improve Outcome: Progressing   Problem: Skin Integrity: Goal: Risk for impaired skin integrity will decrease Outcome: Progressing

## 2021-06-13 NOTE — Progress Notes (Signed)
PROGRESS NOTE  Ruben [name] ZLD:[id number] DOB: 1945/05/15 DOA: 06/07/2021 PCP: [name], FNP  HPI/Recap of past 24 hours: 76 y.o. male with medical history significant of hypertension, hyperlipidemia, ESRD on HD, CAD, recent hospitalization for non-STEMI and acute systolic CHF with cardiogenic shock requiring ICU stay and inotrope support, A. fib with RVR on Eliquis, prior alcohol abuse, and tobacco abuse who presents with complaints of rectal pain and difficulty swallowing over the last 2 weeks.  His most recent hospital stay was prolonged, he was here for almost a month.  He has declined since, with weight loss, poor p.o. intake, dysphagia as well as increased constipation.  There was concern for acute cholecystitis on admission and general surgery was consulted.  Initial imaging showed some nonspecific liver lesions, on a noncontrast study, and also patient complained of dysphagia for which GI was consulted.  Hospital course complicated by worsening elevated chemistries and nausea.  Also complicated by severe rectal pain despite Anusol.    06/13/21: Seen and examined at his bedside.  No longer complains of abdominal pain now complaining of severe rectal pain, worse with bowel movements, despite Anusol.  Assessment/Plan: Active Problems:   HLD (hyperlipidemia)   ESRD on hemodialysis (HCC)   Weight loss   Suspected acute cholecystitis   Rectal pain and constipation   Dysphagia   Paroxysmal atrial fibrillation (HCC)   Chronic systolic CHF (congestive heart failure) (HCC)   ETOH abuse   Normocytic anemia   Transaminitis   CAD (coronary artery disease)   Liver lesions  Rectal pain and constipation- (present on admission) -patient reports complaints of constipation and rectal pain for which she has been unable to have adequate bowel movement despite taking stool softeners, sitz bath's, use of Preparation H, and suppositories. CT scan of the abdomen pelvis did not note any signs  of fecal impaction or bowel obstruction, but appears to show stool throughout the colon.  GI consulted, status post manual disimpaction.  Continue bowel regimen. Currently on Anusol for rectal pain. Appreciate GIs assistance Continue pain management. Plan FFS tomorrow  Worsening Transaminitis- (present on admission) -LFTs are up trending in the setting of nausea and right upper quadrant abdominal pain -Repeat LFTs.   Chronic systolic CHF (congestive heart failure) ([place])- (present on admission) -Following NSTEMI in January 2023 patient was noted to have gone into cardiogenic shock and required temporary stay in ICU requiring pressors which were able to be weaned off and he was transition to midodrine.  EF 35-40% on echocardiogram with moderate mitral regurgitation. Currently patient does not appear grossly fluid overloaded.  Continue midodrine, fluid management per dialysis. Continue strict I's and O's and daily weight.  No longer makes urine.   Liver lesions - CT scan on admission showed low-attenuation lesions measuring about 12 mm diameter in the liver.  Defer to GI, discussed with Dr. Collene Mares, she will talk to radiology.  Keep on heparin drip in case a biopsy is needed   CAD (coronary artery disease)- (present on admission) -Patient had recent NSTEMI 04/2021 for which he underwent cardiac catheterization noting RCA occlusion with diffuse disease of the LAD and left circumflex.  It was not amendable to PCI and he was not a surgical candidate for CABG. Continue statin   Paroxysmal atrial fibrillation ([place]) -Patient appears to be in sinus rhythm at this time and rate controlled. Held home Eliquis. Heparin drip per pharmacy for possibility of needed procedure. Continue amiodarone 200 mg daily   ESRD on hemodialysis (HCC)MWF -He  has new end-stage renal disease following his most recent hospital stay.  He was discharged January 2023.  Currently MWF.  Just had an AV fistula placed, it has not  matured and currently having dialysis via HD cath.   Hemodialysis on 06/12/2021.   Chronic normocytic anemia/anemia of chronic disease- (present on admission) -Hemoglobin 9.3 from 9.5 No overt bleeding.   ETOH abuse -Patient has not not drank alcohol since he was admitted to the hospital in December 2022. Continue to encourage abstinence from alcohol   Dysphagia -Patient complains of food and liquids getting stuck in his throat causing him to vomit it back up.  Denies complaints of coughing while eating.  He has a prior history of alcohol abuse.  Management per gastroenterology   Suspected acute cholecystitis -Patient presented with complaints of abdominal pain with nausea and vomiting on admission.  CT scan of the abdomen pelvis significant for increased density in the gallbladder concerning for milk of calcium or large gallstones with gallbladder wall thickening.  General surgery has been consulted and evaluated patient, and they do not feel that imaging and physical exam and symptoms are consistent with acute cholecystitis.  They signed off.  He was initially placed on ceftriaxone but it has been discontinued now.   Weight loss -family reports 20-30 pound weight loss over the last month.  Question if multifactorial in the recent NSTEMI, ESRD, and constipation with poor p.o. intake.  TSH unremarkable.  Nonspecific liver lesions noted on the CT scan are concerning, GI to weigh in   HLD (hyperlipidemia)- (present on admission) -Hold off statin with uptrending liver chemistries     Code Status: Full Code   Family Communication: His wife at bedside.   Status is: Inpatient   Remains inpatient appropriate because: Severity of illness     Level of care: Telemetry Medical   Consultants:  Gastroenterology General surgery Nephrology   Procedures:  none   Microbiology  none    Objective: Vitals:   06/12/21 1554 06/12/21 2136 06/13/21 0453 06/13/21 0811  BP: (!) 115/59 115/65  122/69 115/61  Pulse: 76 94 (!) 106 80  Resp: 18 20 16 18   Temp: 97.9 F (36.6 C) 97.7 F (36.5 C) 97.6 F (36.4 C) 98.4 F (36.9 C)  TempSrc: Oral Oral    SpO2: 100% 100% 91% 92%  Weight:      Height:        Intake/Output Summary (Last 24 hours) at 06/13/2021 1442 Last data filed at 06/12/2021 1800 Gross per 24 hour  Intake 250 ml  Output 2000 ml  Net -1750 ml   Filed Weights   06/11/21 0628 06/12/21 0500 06/12/21 1519  Weight: 71 kg 72.1 kg 70.1 kg    Exam:  General: 76 y.o. year-old male Well developed well nourished in no acute distress. Alert and oriented x 3. Cardiovascular: Regular rate and rhythm.  No rubs and gallops.   Respiratory: Clear to auscultation. No wheezes.  Abdomen: Soft non tender non distended bowel sounds. Musculoskeletal: No lower extremity edema. Skin: No ulcerative lesions Psychiatry: Mood is appropriate for condition.  Neuro: Moves all 4 extremities.   Data Reviewed: CBC: Recent Labs  Lab 06/07/21 1610 06/08/21 1157 06/09/21 0050 06/10/21 0203 06/11/21 0649 06/12/21 0402 06/13/21 0115  WBC 7.0   < > 9.4 10.3 9.8 8.9 8.0  NEUTROABS 5.4  --   --   --   --   --   --   HGB 9.4*   < > 9.1*  9.4* 9.5* 9.3* 10.1*  HCT 28.5*   < > 28.2* 28.4* 28.2* 27.5* 30.6*  MCV 95.3   < > 95.6 95.3 95.3 95.5 96.8  PLT 177   < > 185 202 179 184 165   < > = values in this interval not displayed.   Basic Metabolic Panel: Recent Labs  Lab 06/07/21 1610 06/08/21 1157 06/09/21 0050 06/10/21 0203 06/11/21 0649 06/12/21 0402 06/13/21 0115  NA 140   < > 140 134* 139 134* 135  K 3.6   < > 4.4 4.8 4.1 4.3 4.5  CL 95*   < > 95* 92* 97* 95* 96*  CO2 32   < > 27 26 27 22 24   GLUCOSE 121*   < > 151* 132* 108* 98 81  BUN 10   < > 40* 69* 44* 63* 44*  CREATININE 2.80*   < > 5.90* 7.63* 5.19* 6.81* 5.26*  CALCIUM 8.3*   < > 9.1 9.1 8.6* 8.8* 9.1  MG 1.9  --   --   --   --   --   --   PHOS  --    < > 5.8* 7.9* 5.8* 6.6* 5.8*   < > = values in this interval  not displayed.   GFR: Estimated Creatinine Clearance: 12 mL/min (A) (by C-G formula based on SCr of 5.26 mg/dL (H)). Liver Function Tests: Recent Labs  Lab 06/07/21 1610 06/08/21 1157 06/09/21 0050 06/10/21 0203 06/11/21 0649 06/12/21 0402 06/13/21 0115  AST 55*  --   --  113*  --  311*  --   ALT 32  --   --  78*  --  244*  --   ALKPHOS 90  --   --  126  --  196*  --   BILITOT 0.9  --   --  0.9  --  1.3*  --   PROT 7.1  --   --  6.1*  --  5.6*  --   ALBUMIN 3.3*   < > 3.0* 3.1* 2.9* 2.9* 3.0*   < > = values in this interval not displayed.   Recent Labs  Lab 06/07/21 1610  LIPASE 39   No results for input(s): AMMONIA in the last 168 hours. Coagulation Profile: No results for input(s): INR, PROTIME in the last 168 hours. Cardiac Enzymes: No results for input(s): CKTOTAL, CKMB, CKMBINDEX, TROPONINI in the last 168 hours. BNP (last 3 results) No results for input(s): PROBNP in the last 8760 hours. HbA1C: No results for input(s): HGBA1C in the last 72 hours. CBG: No results for input(s): GLUCAP in the last 168 hours. Lipid Profile: No results for input(s): CHOL, HDL, LDLCALC, TRIG, CHOLHDL, LDLDIRECT in the last 72 hours. Thyroid Function Tests: No results for input(s): TSH, T4TOTAL, FREET4, T3FREE, THYROIDAB in the last 72 hours. Anemia Panel: No results for input(s): VITAMINB12, FOLATE, FERRITIN, TIBC, IRON, RETICCTPCT in the last 72 hours.  Urine analysis:    Component Value Date/Time   COLORURINE YELLOW 05/14/2021 1842   APPEARANCEUR CLEAR 05/14/2021 1842   LABSPEC >1.030 (H) 05/14/2021 1842   PHURINE 5.5 05/14/2021 1842   GLUCOSEU NEGATIVE 05/14/2021 1842   HGBUR NEGATIVE 05/14/2021 1842   BILIRUBINUR NEGATIVE 05/14/2021 1842   BILIRUBINUR negative 01/23/2020 1028   KETONESUR NEGATIVE 05/14/2021 1842   PROTEINUR NEGATIVE 05/14/2021 1842   UROBILINOGEN 1.0 01/23/2020 1028   NITRITE NEGATIVE 05/14/2021 1842   LEUKOCYTESUR NEGATIVE 05/14/2021 1842   Sepsis  Labs: @LABRCNTIP (procalcitonin:4,lacticidven:4)  ) Recent  Results (from the past 240 hour(s))  Resp Panel by RT-PCR (Flu A&B, Covid) Nasopharyngeal Swab     Status: None   Collection Time: 06/07/21  8:40 PM   Specimen: Nasopharyngeal Swab; Nasopharyngeal(NP) swabs in vial transport medium  Result Value Ref Range Status   SARS Coronavirus 2 by RT PCR NEGATIVE NEGATIVE Final    Comment: (NOTE) SARS-CoV-2 target nucleic acids are NOT DETECTED.  The SARS-CoV-2 RNA is generally detectable in upper respiratory specimens during the acute phase of infection. The lowest concentration of SARS-CoV-2 viral copies this assay can detect is 138 copies/mL. A negative result does not preclude SARS-Cov-2 infection and should not be used as the sole basis for treatment or other patient management decisions. A negative result may occur with  improper specimen collection/handling, submission of specimen other than nasopharyngeal swab, presence of viral mutation(s) within the areas targeted by this assay, and inadequate number of viral copies(<138 copies/mL). A negative result must be combined with clinical observations, patient history, and epidemiological information. The expected result is Negative.  Fact Sheet for Patients:  EntrepreneurPulse.com.au  Fact Sheet for Healthcare Providers:  IncredibleEmployment.be  This test is no t yet approved or cleared by the [place] FDA and  has been authorized for detection and/or diagnosis of SARS-CoV-2 by FDA under an Emergency Use Authorization (EUA). This EUA will remain  in effect (meaning this test can be used) for the duration of the COVID-19 declaration under Section 564(b)(1) of the Act, 21 U.S.C.section 360bbb-3(b)(1), unless the authorization is terminated  or revoked sooner.       Influenza A by PCR NEGATIVE NEGATIVE Final   Influenza B by PCR NEGATIVE NEGATIVE Final    Comment: (NOTE) The Xpert  Xpress SARS-CoV-2/FLU/RSV plus assay is intended as an aid in the diagnosis of influenza from Nasopharyngeal swab specimens and should not be used as a sole basis for treatment. Nasal washings and aspirates are unacceptable for Xpert Xpress SARS-CoV-2/FLU/RSV testing.  Fact Sheet for Patients: EntrepreneurPulse.com.au  Fact Sheet for Healthcare Providers: IncredibleEmployment.be  This test is not yet approved or cleared by the [place] FDA and has been authorized for detection and/or diagnosis of SARS-CoV-2 by FDA under an Emergency Use Authorization (EUA). This EUA will remain in effect (meaning this test can be used) for the duration of the COVID-19 declaration under Section 564(b)(1) of the Act, 21 U.S.C. section 360bbb-3(b)(1), unless the authorization is terminated or revoked.  Performed at [facility], [address] [place], [place] [address]       Studies: CT ABDOMEN PELVIS W CONTRAST  Result Date: 06/12/2021 CLINICAL DATA:  Indeterminate liver lesions, with mildly prominent retrocrural lymph nodes on noncontrast CT. Study is performed as metastatic disease evaluation. EXAM: CT ABDOMEN AND PELVIS WITH CONTRAST TECHNIQUE: Multidetector CT imaging of the abdomen and pelvis was performed using the standard protocol following bolus administration of intravenous contrast. RADIATION DOSE REDUCTION: This exam was performed according to the departmental dose-optimization program which includes automated exposure control, adjustment of the mA and/or kV according to patient size and/or use of iterative reconstruction technique. CONTRAST:  90mL OMNIPAQUE IOHEXOL 300 MG/ML  SOLN COMPARISON:  Noncontrast abdomen and pelvis CTs recently 06/07/2021 and 06/02/2021 FINDINGS: Lower chest: Small to moderate increased right pleural effusion with extension into the major fissure. Unchanged trace left pleural fluid. There is compressive atelectasis  or consolidation in the right lower lobe adjacent the effusion. Increased linear atelectasis right middle lobe. Moderate slightly worsened panchamber cardiomegaly. There  are distended central pulmonary veins, three-vessel calcific CAD and a dialysis catheter terminating in the upper right atrium. There is IVC and hepatic vein reflux indicating elevated right heart pressures. Also the pulmonary trunk is prominent at 3.5 cm indicating arterial hypertension, the ascending aorta borderline aneurysmal at 3.9 cm. Hepatobiliary: Again noted are 2 irregular low-density lesions in the left lobe of the liver, with a 1.4 cm lesion near the dome and a 1.3 cm lesion in the lateral segment. There is an additional indeterminate low-density lesion along the intersegmental division measuring 1.5 cm. These lesions measure above the usual density of fluid. There are additional too small to characterize scattered hypodensities in the liver elsewhere . The larger lesions remain indeterminate and certainly could represent metastases. Liver is 19.5 cm length mildly steatotic and otherwise unremarkable. There is dense contrast in the gallbladder, prior studies showing layering sludge or gravel. There is mild thickening of the free wall which was noted on both prior studies and likely is congestive with trace pericholecystic fluid and small-volume perihepatic ascites which is increased. Pancreas: Unremarkable. Spleen: Unremarkable. Adrenals/Urinary Tract: There is slight nodular thickening of both adrenal glands. There are small bilateral renal cysts, on right largest is 1.9 cm in 15.9 Hounsfield units in the superior pole laterally. On the left largest is in the anterior hilar cortex measuring 1.9 cm and 25 Hounsfield units and does not show any change in density between the noncontrast studies and today's arterial phase images and venous delayed phase images. There are multiple additional tiny hypodensities in both kidneys which are too  small to characterize. There are scattered renal vascular calcifications at the renal hila but there are no collecting system stones, hydronephrosis or findings of acute pyelonephritis. There is mild generalized bladder thickening versus changes of underdistention. The bladder was normal in thickness on the last CT. Stomach/Bowel: No dilatation or wall thickening through the sigmoid segment including the contrast filled appendix. There are left colonic diverticula without evidence of acute diverticulitis. Moderate fold thickening does appear present in the rectum, which was previously stool-filled. Vascular/Lymphatic: There is moderate to heavy mixed aortoiliac plaque disease. There are short-segment dissections in both common iliac arteries but there is no aortic dissection or aneurysm. Some of the soft aortic plaque is ulcerative but no penetrating aortic ulcer or inflammatory changes are seen. Soft plaque causes up to 75-80% luminal stenosis in the distal left external iliac artery. 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 The visualized proximal superficial femoral arteries and common femoral arteries patent. The major visceral arteries are likewise patent. As described previously there are several slightly prominent retrocrural lymph nodes, largest measuring 10 mm in short axis. No further adenopathy is seen. Reproductive: Moderately enlarged prostate. Other: There is mild but increased body wall and mesenteric congestive features. There is small-volume abdominal and pelvic free ascites, but not sufficient for paracentesis. There is no free air or incarcerated hernia. Musculoskeletal: There are chronic lesions of osteonecrosis in both superior femoral heads, without deformity of the femoral head articular surfaces. There are dense bones which may suggest renal osteodystrophy. There are degenerative changes of the lumbar spine. No worrisome regional bone lesion is seen. Bridging  osteophytes anterior left SI joint. IMPRESSION: 1. 3 small indeterminate lesions in the liver above the usual density of fluid. All 3 are in the left lobe, and additional scattered too small to characterize hypodensities elsewhere. Liver dedicated MRI is recommended. 2. Small renal cysts and additional bilateral  too small to characterize cortical hypodensities. Attention to this on MRI recommended. 3. Slightly prominent retrocrural lymph nodes but no further adenopathy is seen. 4. Cardiomegaly with evidence of arterial hypertension, and IVC reflux consistent with elevated right heart pressures. 5. Aortic atherosclerosis. Borderline aneurysmal ectasia ascending thoracic aorta with moderate/heavy mixed abdominal aortic/iliac plaque. Some of aortic soft plaque is ulcerative but no penetrating ulcer is seen. 6. 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 7. Moderate fold thickening in the rectum. This could be due to proctitis, muscular hypertrophy or infiltrating disease. Further evaluation recommended. 8. Mild bladder thickening versus underdistention. 9. Increased right pleural effusion with adjacent consolidation or atelectasis, increased congestive changes in the body wall and mesentery and increased small-volume abdominal and pelvic ascites. 10. Mild gallbladder thickening, likely congestive. Correlate clinically for less likely cholecystitis. 11. Prostatomegaly and remaining findings described above. Electronically Signed   By: Telford Nab M.D.   On: 06/12/2021 23:07    Scheduled Meds:  bisacodyl  10 mg Rectal Daily   Chlorhexidine Gluconate Cloth  6 each Topical Q0600   Chlorhexidine Gluconate Cloth  6 each Topical Q0600   darbepoetin (ARANESP) injection - DIALYSIS  60 mcg Intravenous Q Mon-HD   feeding supplement  237 mL Oral TID BM   ferric citrate   210 mg Oral TID WC   hydrocortisone  25 mg Rectal BID   magnesium oxide  200 mg Oral Daily   midodrine  10 mg Oral TID WC   midodrine  10 mg Oral Q M,W,F-HD   multivitamin  1 tablet Oral QHS   polyethylene glycol  17 g Oral BID   senna-docusate  2 tablet Oral BID   sodium chloride flush  3 mL Intravenous Q12H   [START ON 06/14/2021] sodium phosphate  1 enema Rectal Once    Continuous Infusions:  ferric gluconate (FERRLECIT) IVPB 125 mg (06/13/21 1022)   heparin 550 Units/hr (06/13/21 1022)   promethazine (PHENERGAN) injection (IM or IVPB) 6.25 mg (06/11/21 2113)     LOS: 5 days     Kayleen Memos, MD [facility] Pager [phone]  If 7PM-7AM, please contact night-coverage [email or web address] Password [email or web address] 06/13/2021, 2:42 PM

## 2021-06-13 NOTE — Progress Notes (Signed)
ANTICOAGULATION CONSULT NOTE  Pharmacy Consult for Heparin Indication: atrial fibrillation  Allergies  Allergen Reactions   Other Other (See Comments)    Pollen - sneezing, itching/watery eyes    Patient Measurements: Height: 5\' 11"  (180.3 cm) Weight: 70.1 kg (154 lb 8.7 oz) IBW/kg (Calculated) : 75.3 kg Heparin Dosing Weight: 78 kg  Vital Signs: Temp: 97.6 F (36.4 C) (02/09 0453) Temp Source: Oral (02/08 2136) BP: 122/69 (02/09 0453) Pulse Rate: 106 (02/09 0453)  Labs: Recent Labs    06/11/21 0649 06/12/21 0402 06/13/21 0115  HGB 9.5* 9.3* 10.1*  HCT 28.2* 27.5* 30.6*  PLT 179 184 165  APTT 67* 81* 112*  HEPARINUNFRC >1.10* >1.10* >1.10*  CREATININE 5.19* 6.81* 5.26*     Estimated Creatinine Clearance: 12 mL/min (A) (by C-G formula based on SCr of 5.26 mg/dL (H)).  Assessment: 76 yo male with a history of atrial fibrillation and CAD presents with nausea, vomiting, rectal pain, and worsening abdominal pain. PTA the patient is on apixaban, last dose on 2/3 in the evening (per the patient and family members). Pharmacy is consulted to dose heparin.  Of note, the patient had a recent admission in 04/2021 for NSTEMI, cardiogenic shock, acute systolic CHF, mitral regurgitation, and aortic valve insufficiency.   aPTT slightly supra-therapeutic; heparin level remains elevated.  No issue with heparin infusion nor overt bleeding per discussion with RN.  Labs have been drawn from the right hand, distal to the IV site, due to restriction in the left arm.  Goal of Therapy:  Heparin level 0.3-0.7 units/ml aPTT 66-102 seconds Monitor platelets by anticoagulation protocol: Yes   Plan:  Reduce heparin gtt slightly to 550 units/hr Daily heparin level, aPTT and CBC F/U with transitioning back to Eliquis once all procedures are completed   Lerline Valdivia D. Mina Marble, PharmD, BCPS, [place] 06/13/2021, 7:47 AM

## 2021-06-13 NOTE — Progress Notes (Signed)
Patient heparin reduced to 5.5, rate change as documented on mar this morning. Patient NPO from 1pm, scheduled for ERCP tonight then will need dialysis tomorrow.

## 2021-06-14 ENCOUNTER — Encounter (HOSPITAL_COMMUNITY): Payer: Self-pay | Admitting: Internal Medicine

## 2021-06-14 ENCOUNTER — Encounter (HOSPITAL_COMMUNITY)
Admission: EM | Disposition: A | Discharge status: Skilled Nursing Facility | Payer: Self-pay | Source: Home / Self Care | Attending: Internal Medicine

## 2021-06-14 ENCOUNTER — Inpatient Hospital Stay (HOSPITAL_COMMUNITY): Payer: Medicare Other | Admitting: Certified Registered Nurse Anesthetist

## 2021-06-14 DIAGNOSIS — D649 Anemia, unspecified: Secondary | ICD-10-CM

## 2021-06-14 DIAGNOSIS — K602 Anal fissure, unspecified: Secondary | ICD-10-CM

## 2021-06-14 DIAGNOSIS — K633 Ulcer of intestine: Secondary | ICD-10-CM

## 2021-06-14 DIAGNOSIS — K6289 Other specified diseases of anus and rectum: Secondary | ICD-10-CM

## 2021-06-14 HISTORY — PX: FLEXIBLE SIGMOIDOSCOPY: SHX5431

## 2021-06-14 HISTORY — PX: BIOPSY: SHX5522

## 2021-06-14 LAB — RENAL FUNCTION PANEL
Albumin: 3.1 g/dL — ABNORMAL LOW (ref 3.5–5.0)
Anion gap: 17 — ABNORMAL HIGH (ref 5–15)
BUN: 64 mg/dL — ABNORMAL HIGH (ref 8–23)
CO2: 22 mmol/L (ref 22–32)
Calcium: 9.5 mg/dL (ref 8.9–10.3)
Chloride: 95 mmol/L — ABNORMAL LOW (ref 98–111)
Creatinine, Ser: 7.18 mg/dL — ABNORMAL HIGH (ref 0.61–1.24)
GFR, Estimated: 7 mL/min — ABNORMAL LOW (ref >60)
Glucose, Bld: 118 mg/dL — ABNORMAL HIGH (ref 70–99)
Phosphorus: 7.7 mg/dL — ABNORMAL HIGH (ref 2.5–4.6)
Potassium: 5 mmol/L (ref 3.5–5.1)
Sodium: 134 mmol/L — ABNORMAL LOW (ref 135–145)

## 2021-06-14 LAB — HEPATIC FUNCTION PANEL
ALT: 336 U/L — ABNORMAL HIGH (ref 0–44)
AST: 374 U/L — ABNORMAL HIGH (ref 15–41)
Albumin: 3.2 g/dL — ABNORMAL LOW (ref 3.5–5.0)
Alkaline Phosphatase: 227 U/L — ABNORMAL HIGH (ref 38–126)
Bilirubin, Direct: 0.6 mg/dL — ABNORMAL HIGH (ref 0.0–0.2)
Indirect Bilirubin: 0.9 mg/dL (ref 0.3–0.9)
Total Bilirubin: 1.5 mg/dL — ABNORMAL HIGH (ref 0.3–1.2)
Total Protein: 6.4 g/dL — ABNORMAL LOW (ref 6.5–8.1)

## 2021-06-14 LAB — CBC
HCT: 31.2 % — ABNORMAL LOW (ref 39.0–52.0)
Hemoglobin: 10.1 g/dL — ABNORMAL LOW (ref 13.0–17.0)
MCH: 31.6 pg (ref 26.0–34.0)
MCHC: 32.4 g/dL (ref 30.0–36.0)
MCV: 97.5 fL (ref 80.0–100.0)
Platelets: 196 10*3/uL (ref 150–400)
RBC: 3.2 MIL/uL — ABNORMAL LOW (ref 4.22–5.81)
RDW: 17.2 % — ABNORMAL HIGH (ref 11.5–15.5)
WBC: 10.5 10*3/uL (ref 4.0–10.5)
nRBC: 10 % — ABNORMAL HIGH (ref 0.0–0.2)

## 2021-06-14 LAB — HEPATITIS B SURFACE ANTIGEN: Hepatitis B Surface Ag: NONREACTIVE

## 2021-06-14 LAB — APTT: aPTT: 130 seconds — ABNORMAL HIGH (ref 24–36)

## 2021-06-14 LAB — HEPARIN LEVEL (UNFRACTIONATED): Heparin Unfractionated: >1.1 IU/mL — ABNORMAL HIGH (ref 0.30–0.70)

## 2021-06-14 LAB — HEPATITIS B SURFACE ANTIBODY,QUALITATIVE: Hep B S Ab: REACTIVE — AB

## 2021-06-14 LAB — BRAIN NATRIURETIC PEPTIDE: B Natriuretic Peptide: >4500 pg/mL — ABNORMAL HIGH (ref 0.0–100.0)

## 2021-06-14 SURGERY — SIGMOIDOSCOPY, FLEXIBLE
Anesthesia: Monitor Anesthesia Care

## 2021-06-14 MED ORDER — ALBUMIN HUMAN 25 % IV SOLN
INTRAVENOUS | Status: AC
  Administered 2021-06-14: 12.5 g via INTRAVENOUS
  Filled 2021-06-14: qty 50

## 2021-06-14 MED ORDER — ALBUMIN HUMAN 25 % IV SOLN: 12.5000 g | Freq: Once | INTRAVENOUS | Status: AC

## 2021-06-14 MED ORDER — PHENYLEPHRINE 40 MCG/ML (10ML) SYRINGE FOR IV PUSH (FOR BLOOD PRESSURE SUPPORT)
PREFILLED_SYRINGE | INTRAVENOUS | Status: DC | PRN
  Administered 2021-06-14 (×5): 80 ug via INTRAVENOUS

## 2021-06-14 MED ORDER — SODIUM CHLORIDE 0.9 % IV SOLN: INTRAVENOUS | Status: DC | PRN

## 2021-06-14 MED ORDER — LIDOCAINE-PRILOCAINE 2.5-2.5 % EX CREA: 1.0000 "application " | TOPICAL_CREAM | CUTANEOUS | Status: DC | PRN

## 2021-06-14 MED ORDER — HEPARIN SODIUM (PORCINE) 1000 UNIT/ML DIALYSIS: 1000.0000 [IU] | INTRAMUSCULAR | Status: DC | PRN

## 2021-06-14 MED ORDER — PROPOFOL 500 MG/50ML IV EMUL
INTRAVENOUS | Status: DC | PRN
  Administered 2021-06-14: 75 ug/kg/min via INTRAVENOUS

## 2021-06-14 MED ORDER — LIDOCAINE HCL (PF) 1 % IJ SOLN: 5.0000 mL | INTRAMUSCULAR | Status: DC | PRN

## 2021-06-14 MED ORDER — DILTIAZEM GEL 2 %
Freq: Two times a day (BID) | CUTANEOUS | Status: DC
  Administered 2021-06-20 – 2021-06-25 (×2): 1 via TOPICAL
  Filled 2021-06-14 (×3): qty 30

## 2021-06-14 MED ORDER — SODIUM CHLORIDE 0.9 % IV SOLN: INTRAVENOUS | Status: DC

## 2021-06-14 MED ORDER — SODIUM CHLORIDE 0.9 % IV SOLN
12.5000 mg | Freq: Once | INTRAVENOUS | Status: AC
  Administered 2021-06-14: 12.5 mg via INTRAVENOUS
  Filled 2021-06-14: qty 0.5

## 2021-06-14 MED ORDER — ALTEPLASE 2 MG IJ SOLR: 2.0000 mg | Freq: Once | INTRAMUSCULAR | Status: DC | PRN

## 2021-06-14 MED ORDER — PENTAFLUOROPROP-TETRAFLUOROETH EX AERO: 1.0000 "application " | INHALATION_SPRAY | CUTANEOUS | Status: DC | PRN

## 2021-06-14 MED ORDER — SODIUM CHLORIDE 0.9 % IV SOLN: 100.0000 mL | INTRAVENOUS | Status: DC | PRN

## 2021-06-14 MED ORDER — HEPARIN (PORCINE) 25000 UT/250ML-% IV SOLN
600.0000 [IU]/h | INTRAVENOUS | Status: AC
  Administered 2021-06-14 – 2021-06-16 (×2): 500 [IU]/h via INTRAVENOUS
  Administered 2021-06-18: 10:00:00 600 [IU]/h via INTRAVENOUS
  Filled 2021-06-14 (×6): qty 250

## 2021-06-14 MED ORDER — PROPOFOL 10 MG/ML IV BOLUS
INTRAVENOUS | Status: DC | PRN
  Administered 2021-06-14 (×3): 5 mg via INTRAVENOUS

## 2021-06-14 MED ORDER — HEPARIN SODIUM (PORCINE) 1000 UNIT/ML DIALYSIS
1000.0000 [IU] | INTRAMUSCULAR | Status: DC | PRN
  Administered 2021-06-14: 1000 [IU] via INTRAVENOUS_CENTRAL

## 2021-06-14 MED ORDER — HEPARIN SODIUM (PORCINE) 1000 UNIT/ML DIALYSIS
2000.0000 [IU] | INTRAMUSCULAR | Status: DC | PRN
  Administered 2021-06-14: 2000 [IU] via INTRAVENOUS_CENTRAL
  Filled 2021-06-14: qty 2

## 2021-06-14 NOTE — Progress Notes (Signed)
[facility] Progress Note   Subjective:    Seen in room. Continued rectal pain, some nausea. Having hiccups this am.   Objective Vitals:   06/13/21 0811 06/13/21 1542 06/13/21 1952 06/14/21 0855  BP: 115/61 110/71 (!) 96/56 138/67  Pulse: 80 82 70 92  Resp: 18 18 16 20   Temp: 98.4 F (36.9 C) 97.8 F (36.6 C) (!) 97.5 F (36.4 C) 97.6 F (36.4 C)  TempSrc:   Oral   SpO2: 92% 99% 100% 100%  Weight:      Height:       Physical Exam General:chronically ill appearing male in NAD Heart:RRR, no mrg Lungs:CTAB, nml WOB on 2L via [place] Abdomen:soft, NTND Extremities:no LE edema Dialysis Access: TDC, LU AVF maturing +b/t  Filed Weights   06/11/21 0628 06/12/21 0500 06/12/21 1519  Weight: 71 kg 72.1 kg 70.1 kg    Intake/Output Summary (Last 24 hours) at 06/14/2021 1024 Last data filed at 06/14/2021 0900 Gross per 24 hour  Intake 513.83 ml  Output --  Net 513.83 ml     Additional Objective Labs: Basic Metabolic Panel: Recent Labs  Lab 06/12/21 0402 06/13/21 0115 06/14/21 0041  NA 134* 135 134*  K 4.3 4.5 5.0  CL 95* 96* 95*  CO2 22 24 22   GLUCOSE 98 81 118*  BUN 63* 44* 64*  CREATININE 6.81* 5.26* 7.18*  CALCIUM 8.8* 9.1 9.5  PHOS 6.6* 5.8* 7.7*    Liver Function Tests: Recent Labs  Lab 06/12/21 0402 06/13/21 0115 06/14/21 0041  AST 311* 411* 374*  ALT 244* 319* 336*  ALKPHOS 196* 287* 227*  BILITOT 1.3* 2.0* 1.5*  PROT 5.6* 6.3* 6.4*  ALBUMIN 2.9* 3.1*   3.0* 3.2*   3.1*    Recent Labs  Lab 06/07/21 1610  LIPASE 39    CBC: Recent Labs  Lab 06/07/21 1610 06/08/21 1157 06/10/21 0203 06/11/21 0649 06/12/21 0402 06/13/21 0115 06/14/21 0041  WBC 7.0   < > 10.3 9.8 8.9 8.0 10.5  NEUTROABS 5.4  --   --   --   --   --   --   HGB 9.4*   < > 9.4* 9.5* 9.3* 10.1* 10.1*  HCT 28.5*   < > 28.4* 28.2* 27.5* 30.6* 31.2*  MCV 95.3   < > 95.3 95.3 95.5 96.8 97.5  PLT 177   < > 202 179 184 165 196   < > = values in this interval not  displayed.    Blood Culture    Component Value Date/Time   SDES BLOOD RIGHT HAND 05/01/2021 0949   SPECREQUEST  05/01/2021 0949    BOTTLES DRAWN AEROBIC AND ANAEROBIC Blood Culture results may not be optimal due to an inadequate volume of blood received in culture bottles   CULT  05/01/2021 0949    NO GROWTH 5 DAYS Performed at [facility], [place] [address], [place], [place] [address]    REPTSTATUS 05/06/2021 FINAL 05/01/2021 0623    Studies/Results: CT ABDOMEN PELVIS W CONTRAST  Result Date: 06/12/2021 CLINICAL DATA:  Indeterminate liver lesions, with mildly prominent retrocrural lymph nodes on noncontrast CT. Study is performed as metastatic disease evaluation. EXAM: CT ABDOMEN AND PELVIS WITH CONTRAST TECHNIQUE: Multidetector CT imaging of the abdomen and pelvis was performed using the standard protocol following bolus administration of intravenous contrast. RADIATION DOSE REDUCTION: This exam was performed according to the departmental dose-optimization program which includes automated exposure control, adjustment of the mA and/or kV according to  patient size and/or use of iterative reconstruction technique. CONTRAST:  36mL OMNIPAQUE IOHEXOL 300 MG/ML  SOLN COMPARISON:  Noncontrast abdomen and pelvis CTs recently 06/07/2021 and 06/02/2021 FINDINGS: Lower chest: Small to moderate increased right pleural effusion with extension into the major fissure. Unchanged trace left pleural fluid. There is compressive atelectasis or consolidation in the right lower lobe adjacent the effusion. Increased linear atelectasis right middle lobe. Moderate slightly worsened panchamber cardiomegaly. There are distended central pulmonary veins, three-vessel calcific CAD and a dialysis catheter terminating in the upper right atrium. There is IVC and hepatic vein reflux indicating elevated right heart pressures. Also the pulmonary trunk is prominent at 3.5 cm indicating arterial hypertension, the ascending aorta  borderline aneurysmal at 3.9 cm. Hepatobiliary: Again noted are 2 irregular low-density lesions in the left lobe of the liver, with a 1.4 cm lesion near the dome and a 1.3 cm lesion in the lateral segment. There is an additional indeterminate low-density lesion along the intersegmental division measuring 1.5 cm. These lesions measure above the usual density of fluid. There are additional too small to characterize scattered hypodensities in the liver elsewhere . The larger lesions remain indeterminate and certainly could represent metastases. Liver is 19.5 cm length mildly steatotic and otherwise unremarkable. There is dense contrast in the gallbladder, prior studies showing layering sludge or gravel. There is mild thickening of the free wall which was noted on both prior studies and likely is congestive with trace pericholecystic fluid and small-volume perihepatic ascites which is increased. Pancreas: Unremarkable. Spleen: Unremarkable. Adrenals/Urinary Tract: There is slight nodular thickening of both adrenal glands. There are small bilateral renal cysts, on right largest is 1.9 cm in 15.9 Hounsfield units in the superior pole laterally. On the left largest is in the anterior hilar cortex measuring 1.9 cm and 25 Hounsfield units and does not show any change in density between the noncontrast studies and today's arterial phase images and venous delayed phase images. There are multiple additional tiny hypodensities in both kidneys which are too small to characterize. There are scattered renal vascular calcifications at the renal hila but there are no collecting system stones, hydronephrosis or findings of acute pyelonephritis. There is mild generalized bladder thickening versus changes of underdistention. The bladder was normal in thickness on the last CT. Stomach/Bowel: No dilatation or wall thickening through the sigmoid segment including the contrast filled appendix. There are left colonic diverticula without  evidence of acute diverticulitis. Moderate fold thickening does appear present in the rectum, which was previously stool-filled. Vascular/Lymphatic: There is moderate to heavy mixed aortoiliac plaque disease. There are short-segment dissections in both common iliac arteries but there is no aortic dissection or aneurysm. Some of the soft aortic plaque is ulcerative but no penetrating aortic ulcer or inflammatory changes are seen. Soft plaque causes up to 75-80% luminal stenosis in the distal left external iliac artery. 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 The visualized proximal superficial femoral arteries and common femoral arteries patent. The major visceral arteries are likewise patent. As described previously there are several slightly prominent retrocrural lymph nodes, largest measuring 10 mm in short axis. No further adenopathy is seen. Reproductive: Moderately enlarged prostate. Other: There is mild but increased body wall and mesenteric congestive features. There is small-volume abdominal and pelvic free ascites, but not sufficient for paracentesis. There is no free air or incarcerated hernia. Musculoskeletal: There are chronic lesions of osteonecrosis in both superior femoral heads, without deformity of the femoral head  articular surfaces. There are dense bones which may suggest renal osteodystrophy. There are degenerative changes of the lumbar spine. No worrisome regional bone lesion is seen. Bridging osteophytes anterior left SI joint. IMPRESSION: 1. 3 small indeterminate lesions in the liver above the usual density of fluid. All 3 are in the left lobe, and additional scattered too small to characterize hypodensities elsewhere. Liver dedicated MRI is recommended. 2. Small renal cysts and additional bilateral too small to characterize cortical hypodensities. Attention to this on MRI recommended. 3. Slightly prominent retrocrural lymph nodes but no further adenopathy is  seen. 4. Cardiomegaly with evidence of arterial hypertension, and IVC reflux consistent with elevated right heart pressures. 5. Aortic atherosclerosis. Borderline aneurysmal ectasia ascending thoracic aorta with moderate/heavy mixed abdominal aortic/iliac plaque. Some of aortic soft plaque is ulcerative but no penetrating ulcer is seen. 6. 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 7. Moderate fold thickening in the rectum. This could be due to proctitis, muscular hypertrophy or infiltrating disease. Further evaluation recommended. 8. Mild bladder thickening versus underdistention. 9. Increased right pleural effusion with adjacent consolidation or atelectasis, increased congestive changes in the body wall and mesentery and increased small-volume abdominal and pelvic ascites. 10. Mild gallbladder thickening, likely congestive. Correlate clinically for less likely cholecystitis. 11. Prostatomegaly and remaining findings described above. Electronically Signed   By: Telford Nab M.D.   On: 06/12/2021 23:07    Medications:  sodium chloride     sodium chloride     sodium chloride     chlorproMAZINE (THORAZINE) IV     ferric gluconate (FERRLECIT) IVPB Stopped (06/13/21 1122)   promethazine (PHENERGAN) injection (IM or IVPB) Stopped (06/13/21 2334)    bisacodyl  10 mg Rectal Daily   Chlorhexidine Gluconate Cloth  6 each Topical Q0600   Chlorhexidine Gluconate Cloth  6 each Topical Q0600   darbepoetin (ARANESP) injection - DIALYSIS  60 mcg Intravenous Q Mon-HD   feeding supplement  237 mL Oral TID BM   ferric citrate  210 mg Oral TID WC   hydrocortisone  25 mg Rectal BID   magnesium oxide  200 mg Oral Daily   midodrine  10 mg Oral TID WC   midodrine  10 mg Oral Q M,W,F-HD   multivitamin  1 tablet Oral QHS   polyethylene glycol  17 g Oral BID    senna-docusate  2 tablet Oral BID   sodium chloride flush  3 mL Intravenous Q12H    Dialysis Orders: MWF at [facility] 4hr, 400/500, EDW 72kg (although leaving 74kg range), 2K/2Ca, TDC (+ maturing AVF), heparin 2000 unit bolus - No ESA or VDRA   Assessment/Plan: Rectal pain/constipation: On aggressive bowel regimen.  Disimpacted by GI w/large amount of stool removed. Having good BM. CT  with proctitis. GI following. For FFS study.  Liver/kidney lesions: CT completed yesterday and recommending MRI to further characterize liver lesions. Dysphagia: Swallow studies normal.  Elevated LFTs - trending up. Currently NPO. Per GI.  ESRD:  MWF.  HD today  Hypotension/volume: Chronically low BP, on midodrine 10mg  TID. Ordered prn midodrine to be given pre HD. Increasing volume noted on CT.  Increase goal for HD Anemia: Hgb 10.1 - FOBT negative. Tsat 24%, IV iron ordered. ESA started.  Metabolic bone disease: CorrCa and Phos high. Will need to start binder but holding off for now due to constipation/abdominal pain.  Plan to start auryxia 1 AC TID.  CAD/HFrEF - missed outpatient cardiology follow up today.  Will need to reschedule after d/c unless need to be seen as inpatient. A-fib: On amiodarone Nutrition - Renal diet w/fluid restrictions once advanced.   Ruben Child PA-C [facility] 06/14/2021,10:24 AM

## 2021-06-14 NOTE — Interval H&P Note (Signed)
History and Physical Interval Note:  06/14/2021 12:13 PM  Ruben [name]  has presented today for surgery, with the diagnosis of Proctalgia.  The various methods of treatment have been discussed with the patient and family. After consideration of risks, benefits and other options for treatment, the patient has consented to  Procedure(s): FLEXIBLE SIGMOIDOSCOPY (N/A) as a surgical intervention.  The patient's history has been reviewed, patient examined, no change in status, stable for surgery.  I have reviewed the patient's chart and labs.  Questions were answered to the patient's satisfaction.     Willella Harding D

## 2021-06-14 NOTE — Progress Notes (Signed)
Nutrition Follow-up  DOCUMENTATION CODES:  Severe malnutrition in context of chronic illness  INTERVENTION:  Recommend regular diet to maximize intake choices Continue Ensure Enlive po BID, each supplement provides 350 kcal and 20 grams of protein. Consider nutrition support if intake has not improved by Monday Renavite daily  NUTRITION DIAGNOSIS:  Severe Malnutrition (in the context of chronic illness) related to poor appetite as evidenced by severe fat depletion, severe muscle depletion, percent weight loss (13.4% x 4 months). - ongoing  GOAL:  Patient will meet greater than or equal to 90% of their needs - supplements in place  MONITOR:  PO intake, Diet advancement, Labs  REASON FOR ASSESSMENT:  Consult Assessment of nutrition requirement/status  ASSESSMENT:  76 y.o. male with history of CAD, HTN, HLD, EtOH abuse, CKD3, hx MI, and ESRD on HD who presented to ED with concern for rectal pain, nausea, vomiting, and worsening abdominal pain for several weeks.  Pt out of room at the attempted time of visit. Reviewed intake and none recorded since last assessment earlier this week. Pt has continued to complain of rectal pain despite having good BMs. Imaging showed rectal thickening and GI took for flexible sigmoidoscopy this AM to evaluate.   Plans to go to HD later this afternoon.   Due to poor intake, would recommend regular diet without dietary restrictions after procedure.  Did discuss with attending and nephrology that pt may benefit from nutrition support if he is unable to meet needs through diet.   Average Meal Intake: 2/8-2/10: 0% intake x 3 recorded meals  Nutritionally Relevant Medications: Scheduled Meds:  bisacodyl  10 mg Rectal Daily   feeding supplement  237 mL Oral TID BM   ferric citrate  210 mg Oral TID WC   magnesium oxide  200 mg Oral Daily   multivitamin  1 tablet Oral QHS   polyethylene glycol  17 g Oral BID   senna-docusate  2 tablet Oral BID    PRN Meds: ondansetron, promethazine  Labs Reviewed: Sodium 134 / chloride 95 BUN 64, creatinine 7.18 Phosphorus 7.7 SBG ranges from 81-118 mg/dL over the last 24 hours  Lab Results  Component Value Date   ALT 336 (H) 06/14/2021   AST 374 (H) 06/14/2021   ALKPHOS 227 (H) 06/14/2021   BILITOT 1.5 (H) 06/14/2021    NUTRITION - FOCUSED PHYSICAL EXAM: Flowsheet Row Most Recent Value  Orbital Region Moderate depletion  Upper Arm Region Severe depletion  Thoracic and Lumbar Region Severe depletion  Buccal Region Moderate depletion  Temple Region Moderate depletion  Clavicle Bone Region Moderate depletion  Clavicle and Acromion Bone Region Moderate depletion  Scapular Bone Region Moderate depletion  Dorsal Hand Severe depletion  Patellar Region Severe depletion  Anterior Thigh Region Severe depletion  Posterior Calf Region Severe depletion  Edema (RD Assessment) None  Hair Reviewed  Eyes Reviewed  Skin Reviewed  Nails Reviewed   Diet Order:   Diet Order             Diet regular Room service appropriate? Yes; Fluid consistency: Thin  Diet effective now                   EDUCATION NEEDS:  Education needs have been addressed  Skin:  Skin Assessment: Reviewed RN Assessment  Last BM:  2/10  Height:  Ht Readings from Last 1 Encounters:  06/07/21 5\' 11"  (1.803 m)    Weight:  Wt Readings from Last 1 Encounters:  06/14/21 71.5 kg  Ideal Body Weight:  78.2 kg  BMI:  Body mass index is 21.98 kg/m.  Estimated Nutritional Needs:  Kcal:  2100-2300 kcal/d Protein:  100-110 g/d Fluid:  1L+UOP   Ranell Patrick, RD, LDN Clinical Dietitian RD pager # available in [facility]  After hours/weekend pager # available in [facility]

## 2021-06-14 NOTE — Transfer of Care (Signed)
Immediate Anesthesia Transfer of Care Note  Patient: Ruben [name]  Procedure(s) Performed: FLEXIBLE SIGMOIDOSCOPY BIOPSY  Patient Location: Endoscopy Unit  Anesthesia Type:MAC  Level of Consciousness: drowsy and patient cooperative  Airway & Oxygen Therapy: Patient Spontanous Breathing and Patient connected to nasal cannula oxygen  Post-op Assessment: Report given to RN and Post -op Vital signs reviewed and stable  Post vital signs: Reviewed and stable  Last Vitals:  Vitals Value Taken Time  BP 92/34 06/14/21 1300  Temp    Pulse 66 06/14/21 1301  Resp 22 06/14/21 1301  SpO2 97 % 06/14/21 1301  Vitals shown include unvalidated device data.  Last Pain:  Vitals:   06/14/21 1120  TempSrc: Temporal  PainSc: 0-No pain      Patients Stated Pain Goal: 3 (57/84/69 6295)  Complications: No notable events documented.

## 2021-06-14 NOTE — Progress Notes (Signed)
PT Cancellation Note  Patient Details Name: Ruben [name] MRN: [id number] DOB: 01/17/1946   Cancelled Treatment:    Reason Eval/Treat Not Completed: Patient at procedure or test/unavailable.  Endoscopy today, retry as time and pt allow.   Ramond Dial 06/14/2021, 11:38 AM  Mee Hives, PT [name] Acute Rehab Dept. Number: [place] and [place]

## 2021-06-14 NOTE — Anesthesia Procedure Notes (Signed)
Procedure Name: MAC Date/Time: 06/14/2021 12:35 PM Performed by: Janene Harvey, CRNA Pre-anesthesia Checklist: Patient identified, Emergency Drugs available, Suction available and Patient being monitored Patient Re-evaluated:Patient Re-evaluated prior to induction Oxygen Delivery Method: Nasal cannula Induction Type: IV induction Placement Confirmation: positive ETCO2 Dental Injury: Teeth and Oropharynx as per pre-operative assessment

## 2021-06-14 NOTE — Progress Notes (Signed)
PROGRESS NOTE  Ruben [name] GDJ:[id number] DOB: 1946-01-07 DOA: 06/07/2021 PCP: [name], FNP  HPI/Recap of past 24 hours: 76 y.o. male with medical history significant of hypertension, hyperlipidemia, ESRD on HD, CAD, recent hospitalization for non-STEMI and acute systolic CHF with cardiogenic shock requiring ICU stay and inotrope support, A. fib with RVR on Eliquis, prior alcohol abuse, and tobacco abuse who presents with complaints of rectal pain and difficulty swallowing over the last 2 weeks.  His most recent hospital stay was prolonged, he was here for almost a month.  He has declined since, with weight loss, poor p.o. intake, dysphagia as well as increased constipation.  There was concern for acute cholecystitis on admission and general surgery was consulted.  Initial imaging showed some nonspecific liver lesions, on a noncontrast study, and also patient complained of dysphagia for which GI was consulted.  Hospital course complicated by worsening elevated chemistries and nausea.  Also complicated by severe rectal pain despite Anusol.    06/14/21: Seen and examined at his bedside.  Family member was present in the room.  He has rectal pain 7 out of 10 improved from 10 out of 10 yesterday when he was having a bowel movement.  Plan for flexible sigmoidoscopy on 06/14/2021 by Dr. Benson Norway due to proctalgia.  Assessment/Plan: Active Problems:   HLD (hyperlipidemia)   ESRD on hemodialysis (HCC)   Weight loss   Suspected acute cholecystitis   Rectal pain and constipation   Dysphagia   Paroxysmal atrial fibrillation (HCC)   Chronic systolic CHF (congestive heart failure) (HCC)   ETOH abuse   Normocytic anemia   Transaminitis   CAD (coronary artery disease)   Liver lesions  Proctalgia (rectal pain) and constipation- (present on admission) Seen by GI, status post manual disimpaction.  Continue bowel regimen to avoid recurrent constipation. Currently on Anusol for rectal  pain. Moderate fold thickening in the rectum, seen on CT scan abdomen and pelvis with contrast done on 06/11/2021.  Plan for flexible sigmoidoscopy on 06/14/2021 due to refractory proctalgia. Continue pain management and bowel regimen  Transaminitis/elevated liver chemistries- (present on admission) Liver chemistries are persistently elevated Continue to avoid hepatotoxic agents. Liver lesions seen on CT scan Follow MRI liver with and without contrast, ordered by GI   Acute on chronic systolic CHF (congestive heart failure) ([place])- (present on admission) -Following NSTEMI in January 2023 patient was noted to have gone into cardiogenic shock and required temporary stay in ICU requiring pressors which were able to be weaned off and he was transition to midodrine.   2D echo done on 05/13/2021 revealed EF 35-40% with moderate mitral regurgitation.  Volume status management hemodialysis.   Continue strict I's and O's and daily weight.   Liver lesions, follow-up MRI liver - CT scan on admission showed low-attenuation lesions measuring about 12 mm diameter in the liver.   Keep on heparin drip in case a biopsy is needed Follow MRI liver with and without contrast, ordered by GI.   CAD (coronary artery disease)- (present on admission) -Patient had recent NSTEMI 04/2021 for which he underwent cardiac catheterization noting RCA occlusion with diffuse disease of the LAD and left circumflex.  It was not amendable to PCI and he was not a surgical candidate for CABG.  Home statin and amiodarone held due to elevated liver chemistries.   Paroxysmal atrial fibrillation (HCC) -Patient appears to be in sinus rhythm at this time and rate controlled. Held home Eliquis. Heparin drip per pharmacy for possibility of  needed procedure. Amiodarone on hold due to elevated liver chemistries. Rate is currently controlled.   ESRD on hemodialysis (HCC)MWF -He has new end-stage renal disease following his most recent  hospital stay.  He was discharged January 2023.  Currently MWF.  Just had an AV fistula placed, it has not matured and currently having dialysis via HD cath.   Hemodialysis on 06/12/2021.  Chronic hypotension Continue home midodrine 10 mg 3 times daily. Maintain MAP greater than 65.   Chronic normocytic anemia/anemia of chronic disease- (present on admission) -Hemoglobin stable 10.1 from 10.1 No overt bleeding. Iron studies done on 06/10/2021, no iron deficiency.   ETOH abuse -Patient has not not drank alcohol since he was admitted to the hospital in December 2022. Continue to encourage abstinence from alcohol   Dysphagia -Patient complains of food and liquids getting stuck in his throat causing him to vomit it back up.  Denies complaints of coughing while eating.  He has a prior history of alcohol abuse.  Management per gastroenterology.   Suspected acute cholecystitis -Patient presented with complaints of abdominal pain with nausea and vomiting on admission.  CT scan of the abdomen pelvis significant for increased density in the gallbladder concerning for milk of calcium or large gallstones with gallbladder wall thickening.  General surgery has been consulted and evaluated patient, and they do not feel that imaging and physical exam and symptoms are consistent with acute cholecystitis.  They signed off.  He was initially placed on ceftriaxone but it has been discontinued now.   Weight loss -family reports 20-30 pound weight loss over the last month.  Question if multifactorial in the recent NSTEMI, ESRD, and constipation with poor p.o. intake.  TSH unremarkable.  Nonspecific liver lesions noted on the CT scan are concerning, GI to weigh in   HLD (hyperlipidemia)- (present on admission) -Hold off statin with uptrending liver chemistries     Code Status: Full Code   Family Communication: Family member at bedside.   Status is: Inpatient   Remains inpatient appropriate because: Severity  of illness     Level of care: Telemetry Medical   Consultants:  Gastroenterology General surgery Nephrology   Procedures:  Hemodialysis. Flexible sigmoidoscopy.   Microbiology  none    Objective: Vitals:   06/13/21 1952 06/14/21 0855 06/14/21 1120 06/14/21 1300  BP: (!) 96/56 138/67 (!) 108/45 (!) 92/34  Pulse: 70 92 73 66  Resp: 16 20 15  (!) 24  Temp: (!) 97.5 F (36.4 C) 97.6 F (36.4 C) (!) 97.5 F (36.4 C)   TempSrc: Oral  Temporal   SpO2: 100% 100% 100% 98%  Weight:      Height:        Intake/Output Summary (Last 24 hours) at 06/14/2021 1310 Last data filed at 06/14/2021 1254 Gross per 24 hour  Intake 663.83 ml  Output --  Net 663.83 ml   Filed Weights   06/11/21 0628 06/12/21 0500 06/12/21 1519  Weight: 71 kg 72.1 kg 70.1 kg    Exam:  General: 76 y.o. year-old male frail-appearing in no acute distress.  He is alert and oriented x3.   Cardiovascular: Regular rate and rhythm no rubs gallops.   Respiratory: Clear to auscultation no wheezes or rales.   Abdomen: Soft nontender.  Non distended normal bowel sounds present.   Musculoskeletal: No lower extremity edema bilaterally. Skin: No ulcerative lesions noted. Psychiatry: Mood is appropriate for condition. Neuro: Nonfocal exam.   Data Reviewed: CBC: Recent Labs  Lab 06/07/21 1610  06/08/21 1157 06/10/21 0203 06/11/21 0649 06/12/21 0402 06/13/21 0115 06/14/21 0041  WBC 7.0   < > 10.3 9.8 8.9 8.0 10.5  NEUTROABS 5.4  --   --   --   --   --   --   HGB 9.4*   < > 9.4* 9.5* 9.3* 10.1* 10.1*  HCT 28.5*   < > 28.4* 28.2* 27.5* 30.6* 31.2*  MCV 95.3   < > 95.3 95.3 95.5 96.8 97.5  PLT 177   < > 202 179 184 165 196   < > = values in this interval not displayed.   Basic Metabolic Panel: Recent Labs  Lab 06/07/21 1610 06/08/21 1157 06/10/21 0203 06/11/21 0649 06/12/21 0402 06/13/21 0115 06/14/21 0041  NA 140   < > 134* 139 134* 135 134*  K 3.6   < > 4.8 4.1 4.3 4.5 5.0  CL 95*   < > 92*  97* 95* 96* 95*  CO2 32   < > 26 27 22 24 22   GLUCOSE 121*   < > 132* 108* 98 81 118*  BUN 10   < > 69* 44* 63* 44* 64*  CREATININE 2.80*   < > 7.63* 5.19* 6.81* 5.26* 7.18*  CALCIUM 8.3*   < > 9.1 8.6* 8.8* 9.1 9.5  MG 1.9  --   --   --   --   --   --   PHOS  --    < > 7.9* 5.8* 6.6* 5.8* 7.7*   < > = values in this interval not displayed.   GFR: Estimated Creatinine Clearance: 8.8 mL/min (A) (by C-G formula based on SCr of 7.18 mg/dL (H)). Liver Function Tests: Recent Labs  Lab 06/07/21 1610 06/08/21 1157 06/10/21 0203 06/11/21 0649 06/12/21 0402 06/13/21 0115 06/14/21 0041  AST 55*  --  113*  --  311* 411* 374*  ALT 32  --  78*  --  244* 319* 336*  ALKPHOS 90  --  126  --  196* 287* 227*  BILITOT 0.9  --  0.9  --  1.3* 2.0* 1.5*  PROT 7.1  --  6.1*  --  5.6* 6.3* 6.4*  ALBUMIN 3.3*   < > 3.1* 2.9* 2.9* 3.1*   3.0* 3.2*   3.1*   < > = values in this interval not displayed.   Recent Labs  Lab 06/07/21 1610  LIPASE 39   No results for input(s): AMMONIA in the last 168 hours. Coagulation Profile: No results for input(s): INR, PROTIME in the last 168 hours. Cardiac Enzymes: No results for input(s): CKTOTAL, CKMB, CKMBINDEX, TROPONINI in the last 168 hours. BNP (last 3 results) No results for input(s): PROBNP in the last 8760 hours. HbA1C: No results for input(s): HGBA1C in the last 72 hours. CBG: No results for input(s): GLUCAP in the last 168 hours. Lipid Profile: No results for input(s): CHOL, HDL, LDLCALC, TRIG, CHOLHDL, LDLDIRECT in the last 72 hours. Thyroid Function Tests: No results for input(s): TSH, T4TOTAL, FREET4, T3FREE, THYROIDAB in the last 72 hours. Anemia Panel: No results for input(s): VITAMINB12, FOLATE, FERRITIN, TIBC, IRON, RETICCTPCT in the last 72 hours.  Urine analysis:    Component Value Date/Time   COLORURINE YELLOW 05/14/2021 1842   APPEARANCEUR CLEAR 05/14/2021 1842   LABSPEC >1.030 (H) 05/14/2021 1842   PHURINE 5.5 05/14/2021 1842    GLUCOSEU NEGATIVE 05/14/2021 1842   HGBUR NEGATIVE 05/14/2021 1842   BILIRUBINUR NEGATIVE 05/14/2021 1842   BILIRUBINUR negative 01/23/2020  [place] 05/14/2021 [place] 05/14/2021 1842   UROBILINOGEN 1.0 01/23/2020 1028   NITRITE NEGATIVE 05/14/2021 1842   LEUKOCYTESUR NEGATIVE 05/14/2021 1842   Sepsis Labs: @LABRCNTIP (procalcitonin:4,lacticidven:4)  ) Recent Results (from the past 240 hour(s))  Resp Panel by RT-PCR (Flu A&B, Covid) Nasopharyngeal Swab     Status: None   Collection Time: 06/07/21  8:40 PM   Specimen: Nasopharyngeal Swab; Nasopharyngeal(NP) swabs in vial transport medium  Result Value Ref Range Status   SARS Coronavirus 2 by RT PCR NEGATIVE NEGATIVE Final    Comment: (NOTE) SARS-CoV-2 target nucleic acids are NOT DETECTED.  The SARS-CoV-2 RNA is generally detectable in upper respiratory specimens during the acute phase of infection. The lowest concentration of SARS-CoV-2 viral copies this assay can detect is 138 copies/mL. A negative result does not preclude SARS-Cov-2 infection and should not be used as the sole basis for treatment or other patient management decisions. A negative result may occur with  improper specimen collection/handling, submission of specimen other than nasopharyngeal swab, presence of viral mutation(s) within the areas targeted by this assay, and inadequate number of viral copies(<138 copies/mL). A negative result must be combined with clinical observations, patient history, and epidemiological information. The expected result is Negative.  Fact Sheet for Patients:  EntrepreneurPulse.com.au  Fact Sheet for Healthcare Providers:  IncredibleEmployment.be  This test is no t yet approved or cleared by the [place] FDA and  has been authorized for detection and/or diagnosis of SARS-CoV-2 by FDA under an Emergency Use Authorization (EUA). This EUA will remain  in  effect (meaning this test can be used) for the duration of the COVID-19 declaration under Section 564(b)(1) of the Act, 21 U.S.C.section 360bbb-3(b)(1), unless the authorization is terminated  or revoked sooner.       Influenza A by PCR NEGATIVE NEGATIVE Final   Influenza B by PCR NEGATIVE NEGATIVE Final    Comment: (NOTE) The Xpert Xpress SARS-CoV-2/FLU/RSV plus assay is intended as an aid in the diagnosis of influenza from Nasopharyngeal swab specimens and should not be used as a sole basis for treatment. Nasal washings and aspirates are unacceptable for Xpert Xpress SARS-CoV-2/FLU/RSV testing.  Fact Sheet for Patients: EntrepreneurPulse.com.au  Fact Sheet for Healthcare Providers: IncredibleEmployment.be  This test is not yet approved or cleared by the [place] FDA and has been authorized for detection and/or diagnosis of SARS-CoV-2 by FDA under an Emergency Use Authorization (EUA). This EUA will remain in effect (meaning this test can be used) for the duration of the COVID-19 declaration under Section 564(b)(1) of the Act, 21 U.S.C. section 360bbb-3(b)(1), unless the authorization is terminated or revoked.  Performed at [facility], [address] [place], [place] [address]       Studies: No results found.  Scheduled Meds:  [MAR Hold] bisacodyl  10 mg Rectal Daily   [MAR Hold] Chlorhexidine Gluconate Cloth  6 each Topical Q0600   [MAR Hold] Chlorhexidine Gluconate Cloth  6 each Topical Q0600   [MAR Hold] darbepoetin (ARANESP) injection - DIALYSIS  60 mcg Intravenous Q Mon-HD   diltiazem   Topical BID   [MAR Hold] feeding supplement  237 mL Oral TID BM   [MAR Hold] ferric citrate  210 mg Oral TID WC   [MAR Hold] hydrocortisone  25 mg Rectal BID   [MAR Hold] magnesium oxide  200 mg Oral Daily   [MAR Hold] midodrine  10 mg Oral TID WC   [MAR Hold] midodrine  10 mg Oral Q M,W,F-HD   [MAR Hold] multivitamin  1  tablet Oral QHS   [MAR Hold] polyethylene glycol  17 g Oral BID   [MAR Hold] senna-docusate  2 tablet Oral BID   [MAR Hold] sodium chloride flush  3 mL Intravenous Q12H    Continuous Infusions:  [MAR Hold] sodium chloride     [MAR Hold] sodium chloride     sodium chloride     [MAR Hold] ferric gluconate (FERRLECIT) IVPB 125 mg (06/14/21 1042)   [MAR Hold] promethazine (PHENERGAN) injection (IM or IVPB) Stopped (06/13/21 2334)     LOS: 6 days     Kayleen Memos, MD [facility] Pager [phone]  If 7PM-7AM, please contact night-coverage [email or web address] Password [email or web address] 06/14/2021, 1:10 PM

## 2021-06-14 NOTE — Progress Notes (Addendum)
ANTICOAGULATION CONSULT NOTE  Pharmacy Consult for Heparin Indication: atrial fibrillation  Allergies  Allergen Reactions   Other Other (See Comments)    Pollen - sneezing, itching/watery eyes    Patient Measurements: Height: 5\' 11"  (180.3 cm) Weight: 70.1 kg (154 lb 8.7 oz) IBW/kg (Calculated) : 75.3 kg Heparin Dosing Weight: 78 kg  Vital Signs:    Labs: Recent Labs    06/12/21 0402 06/13/21 0115 06/14/21 0041  HGB 9.3* 10.1* 10.1*  HCT 27.5* 30.6* 31.2*  PLT 184 165 196  APTT 81* 112* 130*  HEPARINUNFRC >1.10* >1.10* >1.10*  CREATININE 6.81* 5.26* 7.18*     Estimated Creatinine Clearance: 8.8 mL/min (A) (by C-G formula based on SCr of 7.18 mg/dL (H)).  Assessment: 76 yo male with a history of atrial fibrillation and CAD presents with nausea, vomiting, rectal pain, and worsening abdominal pain. PTA the patient is on apixaban, last dose on 2/3 in the evening (per the patient and family members). Pharmacy is consulted to dose heparin.  Of note, the patient had a recent admission in 04/2021 for NSTEMI, cardiogenic shock, acute systolic CHF, mitral regurgitation, and aortic valve insufficiency.  AM update - per discussion with RN, patient's lost IV access overnight, and IV team to replace. Heparin has been off, and RN to notify when resumed. Patient scheduled for dialysis later today and FFS at 1200 with GI with plans to hold heparin at 1000 for procedure. aPTT had trended up to 130 on heparin reduced to 550 units/hr - likely will improve once patient has HD. CBC stable. No bleeding issues per discussion with RN.  Goal of Therapy:  Heparin level 0.3-0.7 units/ml aPTT 66-102 seconds Monitor platelets by anticoagulation protocol: Yes   Plan:  Reduce heparin infusion to 500 units/hr on resumption due to previous high level Heparin scheduled to be held at 1000 for 1200 FFS - f/u plan to resume post-procedure Monitor daily aPTT/heparin level until correlating, CBC, s/sx  bleeding F/u transition back to apixaban as appropriate after all procedures complete    Arturo Morton, PharmD, BCPS Please check AMION for all [place] contact numbers Clinical Pharmacist 06/14/2021 8:46 AM   ADDENDUM: Heparin turned off at 1000 prior to [facility]. Per GI, hold for 4 hrs post-op prior to resuming. Ok'd to resume heparin with Dr. [place] Crane. CBC resulted stable today. HD planned later today.  Plan: No bolus. Resume heparin at previous rate 500 units/hr at 1700 (4hrs post-op) Check 8hr aPTT from resumption Monitor daily CBC, s/sx bleeding   Arturo Morton, PharmD, BCPS Please check AMION for all [place] contact numbers Clinical Pharmacist 06/14/2021 2:07 PM

## 2021-06-14 NOTE — Op Note (Signed)
[facility] Patient Name: Ruben [name] Procedure Date : 06/14/2021 MRN: [id number] Attending MD: Carol Ada , MD Date of Birth: 07-May-1945 CSN: [id number] Age: 76 Admit Type: Inpatient Procedure:                Flexible Sigmoidoscopy Indications:              Abnormal CT of the GI tract Providers:                Carol Ada, MD, Dulcy Fanny, [facility]                            Technician, Technician Referring MD:              Medicines:                Propofol per Anesthesia Complications:            No immediate complications. Estimated Blood Loss:     Estimated blood loss was minimal. Procedure:                Pre-Anesthesia Assessment:                           - Prior to the procedure, a History and Physical                            was performed, and patient medications and                            allergies were reviewed. The patient's tolerance of                            previous anesthesia was also reviewed. The risks                            and benefits of the procedure and the sedation                            options and risks were discussed with the patient.                            All questions were answered, and informed consent                            was obtained. Prior Anticoagulants: The patient has                            taken no previous anticoagulant or antiplatelet                            agents. ASA Grade Assessment: III - A patient with                            severe systemic disease. After reviewing the risks  and benefits, the patient was deemed in                            satisfactory condition to undergo the procedure.                           - Sedation was administered by an anesthesia                            professional. Deep sedation was attained.                           After obtaining informed consent, the scope was                            passed under direct  vision. The [id number] ([id number])                            Olympus endoscope was introduced through the anus                            and advanced to the the sigmoid colon. The flexible                            sigmoidoscopy was accomplished without difficulty.                            The patient tolerated the procedure well. The                            quality of the bowel preparation was good. Scope In: Scope Out: Findings:      Discontinuous areas of nonbleeding ulcerated mucosa with no stigmata of       recent bleeding were present in the rectum.      Diffuse moderate inflammation characterized by erythema was found in the       rectum and in the recto-sigmoid colon. Biopsies were taken with a cold       forceps for histology.      An anal fissure was found on perianal exam.      The digital rectal examination was positive for a posterior anal       fissure. In the distal rectum there was evidence of a 5 mm solitary       rectal ulcer consistent with his recent history of fecal impaction. Some       clotted blood was noted in the rectum and the mucosa was erythematous.       Friability was noted. Random cold biopsies were obtained. Impression:               - Mucosal ulceration.                           - Diffuse moderate inflammation was found in the                            rectum and in the recto-sigmoid colon secondary  to                            proctitis. Biopsied.                           - Anal fissure found on perianal exam. Recommendation:           - Return patient to hospital ward for ongoing care.                           - Diltiazem 2% PR BID to TID.                           - Await biopsy results.                           - Continue with laxative regimen.                           - Hold heparin for another 4 hours. Procedure Code(s):        --- Professional ---                           [phone], Sigmoidoscopy, flexible; with biopsy, single                             or multiple Diagnosis Code(s):        --- Professional ---                           K63.3, Ulcer of intestine                           K62.89, Other specified diseases of anus and rectum                           K60.2, Anal fissure, unspecified                           R93.3, Abnormal findings on diagnostic imaging of                            other parts of digestive tract CPT copyright 2019 [employer]. All rights reserved. The codes documented in this report are preliminary and upon coder review may  be revised to meet current compliance requirements. Carol Ada, MD Carol Ada, MD 06/14/2021 1:15:23 PM This report has been signed electronically. Number of Addenda: 0

## 2021-06-14 NOTE — Progress Notes (Signed)
OT Cancellation Note  Patient Details Name: Nishanth Mccaughan MRN: [id number] DOB: [birth year]   Cancelled Treatment:    Reason Eval/Treat Not Completed: Patient at procedure or test/ unavailable  Malka So 06/14/2021, 11:35 AM Nestor Lewandowsky, OTR/L Acute Rehabilitation Services Pager: [phone] Office: [phone]

## 2021-06-14 NOTE — Anesthesia Preprocedure Evaluation (Signed)
Anesthesia Evaluation  Patient identified by MRN, date of birth, ID bandGeneral Assessment Comment:Somnolent, Will wake up and answer questions.   Reviewed: Allergy & Precautions, NPO status , Patient's Chart, lab work & pertinent test results  History of Anesthesia Complications Negative for: history of anesthetic complications  Airway Mallampati: II  TM Distance: >3 FB Neck ROM: Full    Dental  (+) Dental Advisory Given, Edentulous Upper, Poor Dentition, Loose   Pulmonary former smoker,    breath sounds clear to auscultation       Cardiovascular hypertension, + CAD, + Past MI and +CHF   Rhythm:Regular  1. Difficult study due to coughing.  2. Left ventricular ejection fraction, by estimation, is 35 to 40%. The  left ventricle has moderately decreased function. The left ventricle  demonstrates regional wall motion abnormalities with basal to mid inferior  and inferolateral akinesis. There is  mild left ventricular hypertrophy.  3. Right ventricular systolic function is normal. The right ventricular  size is normal.  4. Peak RV-RA gradient 23 mmHg.  5. Left atrial size was mildly dilated. No left atrial/left atrial  appendage thrombus was detected.  6. No ASD or PFO by color doppler.  7. The aortic valve is tricuspid. Aortic valve regurgitation is moderate.  No aortic stenosis is present.  8. There was moderate, likely infarct-related, mitral regurgitation. PISA  ERO 0.09 cm^2 (suspect underestimation). No evidence of mitral stenosis.  9. Normal caliber thoracic aorta with grade IV plaque in the arch.    Neuro/Psych negative psych ROS   GI/Hepatic negative GI ROS, Neg liver ROS,   Endo/Other    Renal/GU ESRF and DialysisRenal disease     Musculoskeletal  (+) Arthritis ,   Abdominal   Peds  Hematology  (+) Blood dyscrasia, anemia , Lab Results      Component                Value               Date                       WBC                      10.5                06/14/2021                HGB                      10.1 (L)            06/14/2021                HCT                      31.2 (L)            06/14/2021                MCV                      97.5                06/14/2021                PLT  196                 06/14/2021              Anesthesia Other Findings   Reproductive/Obstetrics                             Anesthesia Physical Anesthesia Plan  ASA: 4  Anesthesia Plan: MAC   Post-op Pain Management: Minimal or no pain anticipated   Induction:   PONV Risk Score and Plan: 1 and Treatment may vary due to age or medical condition  Airway Management Planned: Nasal Cannula  Additional Equipment: None  Intra-op Plan:   Post-operative Plan:   Informed Consent: I have reviewed the patients History and Physical, chart, labs and discussed the procedure including the risks, benefits and alternatives for the proposed anesthesia with the patient or authorized representative who has indicated his/her understanding and acceptance.     Dental advisory given  Plan Discussed with: CRNA and Anesthesiologist  Anesthesia Plan Comments:         Anesthesia Quick Evaluation

## 2021-06-15 DIAGNOSIS — I2510 Atherosclerotic heart disease of native coronary artery without angina pectoris: Secondary | ICD-10-CM | POA: Diagnosis not present

## 2021-06-15 DIAGNOSIS — R1310 Dysphagia, unspecified: Secondary | ICD-10-CM | POA: Diagnosis not present

## 2021-06-15 DIAGNOSIS — N186 End stage renal disease: Secondary | ICD-10-CM | POA: Diagnosis not present

## 2021-06-15 DIAGNOSIS — K602 Anal fissure, unspecified: Secondary | ICD-10-CM

## 2021-06-15 DIAGNOSIS — Z992 Dependence on renal dialysis: Secondary | ICD-10-CM

## 2021-06-15 DIAGNOSIS — K6289 Other specified diseases of anus and rectum: Secondary | ICD-10-CM | POA: Diagnosis not present

## 2021-06-15 LAB — CBC
HCT: 29.8 % — ABNORMAL LOW (ref 39.0–52.0)
Hemoglobin: 9.8 g/dL — ABNORMAL LOW (ref 13.0–17.0)
MCH: 32.6 pg (ref 26.0–34.0)
MCHC: 32.9 g/dL (ref 30.0–36.0)
MCV: 99 fL (ref 80.0–100.0)
Platelets: 168 10*3/uL (ref 150–400)
RBC: 3.01 MIL/uL — ABNORMAL LOW (ref 4.22–5.81)
RDW: 18.2 % — ABNORMAL HIGH (ref 11.5–15.5)
WBC: 6.7 10*3/uL (ref 4.0–10.5)
nRBC: 7.3 % — ABNORMAL HIGH (ref 0.0–0.2)

## 2021-06-15 LAB — COMPREHENSIVE METABOLIC PANEL
ALT: 301 U/L — ABNORMAL HIGH (ref 0–44)
AST: 299 U/L — ABNORMAL HIGH (ref 15–41)
Albumin: 3.2 g/dL — ABNORMAL LOW (ref 3.5–5.0)
Alkaline Phosphatase: 217 U/L — ABNORMAL HIGH (ref 38–126)
Anion gap: 18 — ABNORMAL HIGH (ref 5–15)
BUN: 38 mg/dL — ABNORMAL HIGH (ref 8–23)
CO2: 23 mmol/L (ref 22–32)
Calcium: 8.8 mg/dL — ABNORMAL LOW (ref 8.9–10.3)
Chloride: 97 mmol/L — ABNORMAL LOW (ref 98–111)
Creatinine, Ser: 5.14 mg/dL — ABNORMAL HIGH (ref 0.61–1.24)
GFR, Estimated: 11 mL/min — ABNORMAL LOW (ref >60)
Glucose, Bld: 77 mg/dL (ref 70–99)
Potassium: 4 mmol/L (ref 3.5–5.1)
Sodium: 138 mmol/L (ref 135–145)
Total Bilirubin: 1.6 mg/dL — ABNORMAL HIGH (ref 0.3–1.2)
Total Protein: 6.3 g/dL — ABNORMAL LOW (ref 6.5–8.1)

## 2021-06-15 LAB — APTT
aPTT: 71 seconds — ABNORMAL HIGH (ref 24–36)
aPTT: 61 seconds — ABNORMAL HIGH (ref 24–36)
aPTT: 101 seconds — ABNORMAL HIGH (ref 24–36)

## 2021-06-15 LAB — HEPARIN LEVEL (UNFRACTIONATED)
Heparin Unfractionated: >1.1 IU/mL — ABNORMAL HIGH (ref 0.30–0.70)
Heparin Unfractionated: >1.1 IU/mL — ABNORMAL HIGH (ref 0.30–0.70)
Heparin Unfractionated: >1.1 IU/mL — ABNORMAL HIGH (ref 0.30–0.70)

## 2021-06-15 LAB — HEPATITIS B SURFACE ANTIBODY, QUANTITATIVE: Hep B S AB Quant (Post): 24.3 m[IU]/mL (ref >9.9)

## 2021-06-15 MED ORDER — PROSOURCE PLUS PO LIQD
30.0000 mL | Freq: Three times a day (TID) | ORAL | Status: DC
  Administered 2021-06-15 – 2021-06-27 (×30): 30 mL via ORAL
  Filled 2021-06-15 (×32): qty 30

## 2021-06-15 MED ORDER — GABAPENTIN 100 MG PO CAPS
100.0000 mg | ORAL_CAPSULE | Freq: Two times a day (BID) | ORAL | Status: DC
  Administered 2021-06-15 – 2021-06-27 (×24): 100 mg via ORAL
  Filled 2021-06-15 (×24): qty 1

## 2021-06-15 MED ORDER — MIDODRINE HCL 5 MG PO TABS
10.0000 mg | ORAL_TABLET | ORAL | Status: AC
  Administered 2021-06-15: 10 mg via ORAL
  Filled 2021-06-15: qty 2

## 2021-06-15 MED ORDER — SIMETHICONE 80 MG PO CHEW
80.0000 mg | CHEWABLE_TABLET | Freq: Four times a day (QID) | ORAL | Status: AC
  Administered 2021-06-15 – 2021-06-16 (×4): 80 mg via ORAL
  Filled 2021-06-15 (×4): qty 1

## 2021-06-15 NOTE — Progress Notes (Signed)
[facility] Progress Note   Subjective:    Flexible sigmoidoscopy yesterday --rectal ulceration/proctitis Completed HD with minimal UF d/t hypotension   Objective Vitals:   06/14/21 1820 06/15/21 0436 06/15/21 0500 06/15/21 0808  BP: (!) 107/52 (!) 93/47  (!) 92/51  Pulse: 71 77  79  Resp: 19 19  16   Temp: 98.5 F (36.9 C) 98.4 F (36.9 C)  97.8 F (36.6 C)  TempSrc:      SpO2:  98%  99%  Weight: 70.6 kg  71.2 kg   Height:       Physical Exam General:chronically ill appearing male in NAD Heart:RRR, no mrg Lungs:CTAB, nml WOB on 2L via [place] Abdomen:soft, NTND Extremities:no LE edema Dialysis Access: TDC, LU AVF maturing +b/t  Filed Weights   06/14/21 1507 06/14/21 1820 06/15/21 0500  Weight: 71.5 kg 70.6 kg 71.2 kg    Intake/Output Summary (Last 24 hours) at 06/15/2021 0945 Last data filed at 06/14/2021 1814 Gross per 24 hour  Intake 150 ml  Output 949 ml  Net -799 ml     Additional Objective Labs: Basic Metabolic Panel: Recent Labs  Lab 06/12/21 0402 06/13/21 0115 06/14/21 0041 06/15/21 0239  NA 134* 135 134* 138  K 4.3 4.5 5.0 4.0  CL 95* 96* 95* 97*  CO2 22 24 22 23   GLUCOSE 98 81 118* 77  BUN 63* 44* 64* 38*  CREATININE 6.81* 5.26* 7.18* 5.14*  CALCIUM 8.8* 9.1 9.5 8.8*  PHOS 6.6* 5.8* 7.7*  --     Liver Function Tests: Recent Labs  Lab 06/13/21 0115 06/14/21 0041 06/15/21 0239  AST 411* 374* 299*  ALT 319* 336* 301*  ALKPHOS 287* 227* 217*  BILITOT 2.0* 1.5* 1.6*  PROT 6.3* 6.4* 6.3*  ALBUMIN 3.1*   3.0* 3.2*   3.1* 3.2*    No results for input(s): LIPASE, AMYLASE in the last 168 hours.  CBC: Recent Labs  Lab 06/11/21 0649 06/12/21 0402 06/13/21 0115 06/14/21 0041 06/15/21 0239  WBC 9.8 8.9 8.0 10.5 6.7  HGB 9.5* 9.3* 10.1* 10.1* 9.8*  HCT 28.2* 27.5* 30.6* 31.2* 29.8*  MCV 95.3 95.5 96.8 97.5 99.0  PLT 179 184 165 196 168    Blood Culture    Component Value Date/Time   SDES BLOOD RIGHT HAND 05/01/2021  0949   SPECREQUEST  05/01/2021 0949    BOTTLES DRAWN AEROBIC AND ANAEROBIC Blood Culture results may not be optimal due to an inadequate volume of blood received in culture bottles   CULT  05/01/2021 0949    NO GROWTH 5 DAYS Performed at [facility] [address], [place], [place] [address]    REPTSTATUS 05/06/2021 FINAL 05/01/2021 0949    Studies/Results: No results found.  Medications:  ferric gluconate (FERRLECIT) IVPB 125 mg (06/14/21 1042)   heparin 500 Units/hr (06/14/21 2040)   promethazine (PHENERGAN) injection (IM or IVPB) Stopped (06/13/21 2334)    bisacodyl  10 mg Rectal Daily   Chlorhexidine Gluconate Cloth  6 each Topical Q0600   Chlorhexidine Gluconate Cloth  6 each Topical Q0600   darbepoetin (ARANESP) injection - DIALYSIS  60 mcg Intravenous Q Mon-HD   diltiazem   Topical BID   feeding supplement  237 mL Oral TID BM   ferric citrate  210 mg Oral TID WC   hydrocortisone  25 mg Rectal BID   magnesium oxide  200 mg Oral Daily   midodrine  10 mg Oral TID WC   midodrine  10  mg Oral Q M,W,F-HD   midodrine  10 mg Oral STAT   multivitamin  1 tablet Oral QHS   polyethylene glycol  17 g Oral BID   senna-docusate  2 tablet Oral BID   sodium chloride flush  3 mL Intravenous Q12H    Dialysis Orders: MWF at [facility] 4hr, 400/500, EDW 72kg (although leaving 74kg range), 2K/2Ca, TDC (+ maturing AVF), heparin 2000 unit bolus - No ESA or VDRA   Assessment/Plan: Rectal pain/constipation: On aggressive bowel regimen.  Disimpacted by GI w/large amount of stool removed. Having good BM but continued rectal pain.  GI following - FFS on 2/10 rectal ulceration/proctitis, biopsies taken.  Liver/kidney lesions: Elevated LFTs. For MRI to further characterize liver lesions seen on CT Dysphagia: Swallow studies normal.  ESRD:  MWF.  Next HD 2/13 Hypotension/volume: Chronically low BP, on midodrine 10mg  TID. Ordered prn midodrine to be given pre HD. Increasing volume noted on CT.  UF as BP allows.  Anemia: Hgb 10.1 - FOBT negative. Tsat 24%, IV iron ordered. Aranesp 60 q Monday.  Metabolic bone disease: CorrCa and Phos high.  Needs binder  start auryxia 1 AC TID.  CAD/HFrEF - missed outpatient cardiology follow up.   Will need to reschedule after d/c unless need to be seen as inpatient. A-fib: On amiodarone Nutrition - On regular diet. RD concerned for poor intake. Prot supp for low albumin.  Follow labs.   Lynnda Child PA-C [facility] 06/15/2021,9:45 AM

## 2021-06-15 NOTE — Progress Notes (Signed)
PROGRESS NOTE  Ruben [name] CBS:[id number] DOB: 05-06-1945 DOA: 06/07/2021 PCP: [name], FNP  HPI/Recap of past 24 hours: 76 y.o. male with medical history significant of hypertension, hyperlipidemia, ESRD on HD MWF, CAD, recent hospitalization for non-STEMI and acute systolic CHF with cardiogenic shock requiring ICU stay and inotrope support, A. fib with RVR on Eliquis, prior alcohol abuse, and tobacco abuse who presents with complaints of rectal pain and difficulty swallowing over the last 2 weeks.  His most recent hospital stay was prolonged, he was here for almost a month.  He has declined since, with weight loss, poor p.o. intake, dysphagia as well as increased constipation.  There was concern for acute cholecystitis on admission and general surgery was consulted.  Initial imaging showed some nonspecific liver lesions, on a noncontrast study, and also patient complained of dysphagia for which GI was consulted.  Hospital course complicated by worsening elevated chemistries, and severe proctitis, proctalgia.  Post flexible sigmoidoscopy on 06/14/2021 by Dr. Benson Norway which revealed Mucosal ulceration, diffuse moderate inflammation was found in the rectum and in the recto-sigmoid colon secondary to proctitis. Biopsied. Anal fissure found on perianal exam.  06/15/21: Seen and examined at his bedside.  Minimally interactive however he answers to questions appropriately.  He reports severe rectal pain 8-9 out of 10.  Hypotensive this morning, now agrees to take his home midodrine.      Assessment/Plan: Active Problems:   HLD (hyperlipidemia)   ESRD on hemodialysis (HCC)   Weight loss   Suspected acute cholecystitis   Rectal pain and constipation   Dysphagia   Paroxysmal atrial fibrillation (HCC)   Chronic systolic CHF (congestive heart failure) (HCC)   ETOH abuse   Normocytic anemia   Transaminitis   CAD (coronary artery disease)   Liver lesions  Proctalgia (rectal pain) and  constipation- (present on admission) Seen by GI, status post manual disimpaction.  Continue bowel regimen to avoid recurrent constipation. Currently on Anusol for rectal pain. Moderate fold thickening in the rectum, seen on CT scan abdomen and pelvis with contrast done on 06/11/2021.  Plan for flexible sigmoidoscopy on 06/14/2021 due to refractory proctalgia. Post flexible sigmoidoscopy on 06/14/2021 by Dr. Benson Norway which revealed Mucosal ulceration, diffuse moderate inflammation was found in the rectum and in the recto-sigmoid colon secondary to proctitis. Biopsied.  Anal fissure found on perianal exam. Continue pain management and bowel regimen  Hypotensive Continue home midodrine 10 mg 3 times daily Maintain MAP greater than 65 Continue to closely monitor vital signs.  Transaminitis/elevated liver chemistries- (present on admission) Liver chemistries are persistently elevated Continue to avoid hepatotoxic agents. Liver lesions seen on CT scan Follow MRI liver with and without contrast, ordered by GI, follow results.   Acute on chronic systolic CHF (congestive heart failure) ([place])- (present on admission) -Following NSTEMI in January 2023 patient was noted to have gone into cardiogenic shock and required temporary stay in ICU requiring pressors which were able to be weaned off and he was transition to midodrine.   2D echo done on 05/13/2021 revealed EF 35-40% with moderate mitral regurgitation.  Volume status management hemodialysis.   Continue strict I's and O's and daily weight.   Liver lesions, follow-up MRI liver - CT scan on admission showed low-attenuation lesions measuring about 12 mm diameter in the liver.   Keep on heparin drip in case a biopsy is needed Follow MRI liver with and without contrast, ordered by GI.   CAD (coronary artery disease)- (present on admission) -Patient had  recent NSTEMI 04/2021 for which he underwent cardiac catheterization noting RCA occlusion with diffuse  disease of the LAD and left circumflex.  It was not amendable to PCI and he was not a surgical candidate for CABG.  Home statin and amiodarone held due to elevated liver chemistries. Denies any anginal symptoms.   Paroxysmal atrial fibrillation (HCC) -Patient appears to be in sinus rhythm at this time and rate controlled. Held home Eliquis. Heparin drip per pharmacy for possibility of needed procedure. Amiodarone on hold due to elevated liver chemistries. Rate is currently controlled.   ESRD on hemodialysis (HCC)MWF -He has new end-stage renal disease following his most recent hospital stay.  He was discharged January 2023.  Currently MWF.  Just had an AV fistula placed, it has not matured and currently having dialysis via HD cath.   Hemodialysis on 06/12/2021 and 06/15/2021. Next dialysis 06/17/2021.   Chronic normocytic anemia/anemia of chronic disease- (present on admission) -Hemoglobin stable 10.1 from 10.1 No overt bleeding. Iron studies done on 06/10/2021, no iron deficiency.   ETOH abuse -Patient has not not drank alcohol since he was admitted to the hospital in December 2022. Continue to encourage abstinence from alcohol   Dysphagia -Patient complains of food and liquids getting stuck in his throat causing him to vomit it back up.  Denies complaints of coughing while eating.  He has a prior history of alcohol abuse.  Management per gastroenterology.   Suspected acute cholecystitis -Patient presented with complaints of abdominal pain with nausea and vomiting on admission.  CT scan of the abdomen pelvis significant for increased density in the gallbladder concerning for milk of calcium or large gallstones with gallbladder wall thickening.  General surgery has been consulted and evaluated patient, and they do not feel that imaging and physical exam and symptoms are consistent with acute cholecystitis.  They signed off.  He was initially placed on ceftriaxone but it has been discontinued  now.   Weight loss -family reports 20-30 pound weight loss over the last month.  Question if multifactorial in the recent NSTEMI, ESRD, and constipation with poor p.o. intake.  TSH unremarkable.  Nonspecific liver lesions noted on the CT scan are concerning, GI to weigh in   HLD (hyperlipidemia)- (present on admission) -Hold off statin with uptrending liver chemistries   Severe protein calorie malnutrition Liberate diet as recommended by dietitian Oral supplements  Elevated alkaline phosphatase Downtrending. Continue to monitor  Resolved hypervolemic hyponatremia Serum sodium 138 from 134 Electrolytes and volume status addressed with hemodialysis   Critical care time: 65 minutes.   Code Status: Full Code   Family Communication: Family member at bedside.   Status is: Inpatient   Remains inpatient appropriate because: Severity of illness     Level of care: Telemetry Medical   Consultants:  Gastroenterology General surgery Nephrology   Procedures:  Hemodialysis. Flexible sigmoidoscopy.   Microbiology  none    Objective: Vitals:   06/14/21 1820 06/15/21 0436 06/15/21 0500 06/15/21 0808  BP: (!) 107/52 (!) 93/47  (!) 92/51  Pulse: 71 77  79  Resp: 19 19  16   Temp: 98.5 F (36.9 C) 98.4 F (36.9 C)  97.8 F (36.6 C)  TempSrc:      SpO2:  98%  99%  Weight: 70.6 kg  71.2 kg   Height:        Intake/Output Summary (Last 24 hours) at 06/15/2021 1150 Last data filed at 06/14/2021 1814 Gross per 24 hour  Intake 150 ml  Output  949 ml  Net -799 ml   Filed Weights   06/14/21 1507 06/14/21 1820 06/15/21 0500  Weight: 71.5 kg 70.6 kg 71.2 kg    Exam:  General: 76 y.o. year-old male frail-appearing no acute distress.  Minimally interactive, oriented x3.   Cardiovascular: Regular rate and rhythm no rubs gallops.   Respiratory: Clear to auscultation no wheezes or rales. Abdomen: Soft nontender normal bowel sounds present. Musculoskeletal: No lower extremity  edema bilaterally. Skin: No ulcerative lesions. Psychiatry: Mood is appropriate for condition and setting. Neuro: Nonfocal exam.   Data Reviewed: CBC: Recent Labs  Lab 06/11/21 0649 06/12/21 0402 06/13/21 0115 06/14/21 0041 06/15/21 0239  WBC 9.8 8.9 8.0 10.5 6.7  HGB 9.5* 9.3* 10.1* 10.1* 9.8*  HCT 28.2* 27.5* 30.6* 31.2* 29.8*  MCV 95.3 95.5 96.8 97.5 99.0  PLT 179 184 165 196 409   Basic Metabolic Panel: Recent Labs  Lab 06/10/21 0203 06/11/21 0649 06/12/21 0402 06/13/21 0115 06/14/21 0041 06/15/21 0239  NA 134* 139 134* 135 134* 138  K 4.8 4.1 4.3 4.5 5.0 4.0  CL 92* 97* 95* 96* 95* 97*  CO2 26 27 22 24 22 23   GLUCOSE 132* 108* 98 81 118* 77  BUN 69* 44* 63* 44* 64* 38*  CREATININE 7.63* 5.19* 6.81* 5.26* 7.18* 5.14*  CALCIUM 9.1 8.6* 8.8* 9.1 9.5 8.8*  PHOS 7.9* 5.8* 6.6* 5.8* 7.7*  --    GFR: Estimated Creatinine Clearance: 12.5 mL/min (A) (by C-G formula based on SCr of 5.14 mg/dL (H)). Liver Function Tests: Recent Labs  Lab 06/10/21 0203 06/11/21 0649 06/12/21 0402 06/13/21 0115 06/14/21 0041 06/15/21 0239  AST 113*  --  311* 411* 374* 299*  ALT 78*  --  244* 319* 336* 301*  ALKPHOS 126  --  196* 287* 227* 217*  BILITOT 0.9  --  1.3* 2.0* 1.5* 1.6*  PROT 6.1*  --  5.6* 6.3* 6.4* 6.3*  ALBUMIN 3.1* 2.9* 2.9* 3.1*   3.0* 3.2*   3.1* 3.2*   No results for input(s): LIPASE, AMYLASE in the last 168 hours.  No results for input(s): AMMONIA in the last 168 hours. Coagulation Profile: No results for input(s): INR, PROTIME in the last 168 hours. Cardiac Enzymes: No results for input(s): CKTOTAL, CKMB, CKMBINDEX, TROPONINI in the last 168 hours. BNP (last 3 results) No results for input(s): PROBNP in the last 8760 hours. HbA1C: No results for input(s): HGBA1C in the last 72 hours. CBG: No results for input(s): GLUCAP in the last 168 hours. Lipid Profile: No results for input(s): CHOL, HDL, LDLCALC, TRIG, CHOLHDL, LDLDIRECT in the last 72  hours. Thyroid Function Tests: No results for input(s): TSH, T4TOTAL, FREET4, T3FREE, THYROIDAB in the last 72 hours. Anemia Panel: No results for input(s): VITAMINB12, FOLATE, FERRITIN, TIBC, IRON, RETICCTPCT in the last 72 hours.  Urine analysis:    Component Value Date/Time   COLORURINE YELLOW 05/14/2021 1842   APPEARANCEUR CLEAR 05/14/2021 1842   LABSPEC >1.030 (H) 05/14/2021 1842   PHURINE 5.5 05/14/2021 1842   GLUCOSEU NEGATIVE 05/14/2021 1842   HGBUR NEGATIVE 05/14/2021 1842   BILIRUBINUR NEGATIVE 05/14/2021 1842   BILIRUBINUR negative 01/23/2020 1028   KETONESUR NEGATIVE 05/14/2021 1842   PROTEINUR NEGATIVE 05/14/2021 1842   UROBILINOGEN 1.0 01/23/2020 1028   NITRITE NEGATIVE 05/14/2021 1842   LEUKOCYTESUR NEGATIVE 05/14/2021 1842   Sepsis Labs: @LABRCNTIP (procalcitonin:4,lacticidven:4)  ) Recent Results (from the past 240 hour(s))  Resp Panel by RT-PCR (Flu A&B, Covid) Nasopharyngeal Swab  Status: None   Collection Time: 06/07/21  8:40 PM   Specimen: Nasopharyngeal Swab; Nasopharyngeal(NP) swabs in vial transport medium  Result Value Ref Range Status   SARS Coronavirus 2 by RT PCR NEGATIVE NEGATIVE Final    Comment: (NOTE) SARS-CoV-2 target nucleic acids are NOT DETECTED.  The SARS-CoV-2 RNA is generally detectable in upper respiratory specimens during the acute phase of infection. The lowest concentration of SARS-CoV-2 viral copies this assay can detect is 138 copies/mL. A negative result does not preclude SARS-Cov-2 infection and should not be used as the sole basis for treatment or other patient management decisions. A negative result may occur with  improper specimen collection/handling, submission of specimen other than nasopharyngeal swab, presence of viral mutation(s) within the areas targeted by this assay, and inadequate number of viral copies(<138 copies/mL). A negative result must be combined with clinical observations, patient history, and  epidemiological information. The expected result is Negative.  Fact Sheet for Patients:  EntrepreneurPulse.com.au  Fact Sheet for Healthcare Providers:  IncredibleEmployment.be  This test is no t yet approved or cleared by the [place] FDA and  has been authorized for detection and/or diagnosis of SARS-CoV-2 by FDA under an Emergency Use Authorization (EUA). This EUA will remain  in effect (meaning this test can be used) for the duration of the COVID-19 declaration under Section 564(b)(1) of the Act, 21 U.S.C.section 360bbb-3(b)(1), unless the authorization is terminated  or revoked sooner.       Influenza A by PCR NEGATIVE NEGATIVE Final   Influenza B by PCR NEGATIVE NEGATIVE Final    Comment: (NOTE) The Xpert Xpress SARS-CoV-2/FLU/RSV plus assay is intended as an aid in the diagnosis of influenza from Nasopharyngeal swab specimens and should not be used as a sole basis for treatment. Nasal washings and aspirates are unacceptable for Xpert Xpress SARS-CoV-2/FLU/RSV testing.  Fact Sheet for Patients: EntrepreneurPulse.com.au  Fact Sheet for Healthcare Providers: IncredibleEmployment.be  This test is not yet approved or cleared by the [place] FDA and has been authorized for detection and/or diagnosis of SARS-CoV-2 by FDA under an Emergency Use Authorization (EUA). This EUA will remain in effect (meaning this test can be used) for the duration of the COVID-19 declaration under Section 564(b)(1) of the Act, 21 U.S.C. section 360bbb-3(b)(1), unless the authorization is terminated or revoked.  Performed at [facility], [address] [place], [place] [address]       Studies: No results found.  Scheduled Meds:  (feeding supplement) PROSource Plus  30 mL Oral TID BM   bisacodyl  10 mg Rectal Daily   Chlorhexidine Gluconate Cloth  6 each Topical Q0600   Chlorhexidine Gluconate  Cloth  6 each Topical Q0600   darbepoetin (ARANESP) injection - DIALYSIS  60 mcg Intravenous Q Mon-HD   diltiazem   Topical BID   ferric citrate  210 mg Oral TID WC   hydrocortisone  25 mg Rectal BID   magnesium oxide  200 mg Oral Daily   midodrine  10 mg Oral TID WC   midodrine  10 mg Oral Q M,W,F-HD   multivitamin  1 tablet Oral QHS   polyethylene glycol  17 g Oral BID   senna-docusate  2 tablet Oral BID   sodium chloride flush  3 mL Intravenous Q12H    Continuous Infusions:  ferric gluconate (FERRLECIT) IVPB 125 mg (06/14/21 1042)   heparin 500 Units/hr (06/14/21 2040)   promethazine (PHENERGAN) injection (IM or IVPB) Stopped (06/13/21 2334)  LOS: 7 days     Kayleen Memos, MD [facility] Pager [phone]  If 7PM-7AM, please contact night-coverage [email or web address] Password [facility] 06/15/2021, 11:50 AM

## 2021-06-15 NOTE — Progress Notes (Addendum)
Ruben [name], Ruben [name], Ruben [name].   Objective:  Vital signs in last 24 hours: Temp:  [97.8 F (36.6 C)-98.5 F (36.9 C)] 97.8 F (36.6 C) (02/11 0808) Pulse Rate:  [63-90] 79 (02/11 0808) Resp:  [14-25] 16 (02/11 0808) BP: (89-113)/(47-76) 92/51 (02/11 0808) SpO2:  [98 %-99 %] 99 % (02/11 0808) Weight:  [70.6 kg-71.2 kg] 71.2 kg (02/11 0500) Last BM Date: 06/14/21 General: Fatigued appearing 76 year old male in no acute distress. Heart: Regular rate and rhythm, no murmurs. Pulm: Breath sounds clear throughout. Abdomen: Soft, nontender.  Positive bowel sounds all 4 quadrants. Extremities:  Without edema. LUE fistula with + bruit and thrill.  Neurologic:  Alert and  oriented x 4. Grossly normal neurologically. Psych:  Alert and cooperative. Normal mood and affect.  Intake/Output from previous day: 02/10 0701 - 02/11 0700 In: 200 [I.V.:150; IV Piggyback:50] Out: 949  Intake/Output this shift: No intake/output data recorded.  Lab Results: Recent Labs    06/13/21 0115 06/14/21 0041 06/15/21 0239  WBC 8.0 10.5 6.7  HGB 10.1* 10.1* 9.8*  HCT 30.6* 31.2* 29.8*  PLT 165 196 168   BMET Recent Labs    06/13/21 0115 06/14/21 0041 06/15/21 0239  NA 135 134* 138  K 4.5 5.0 4.0  CL 96* 95* 97*  CO2 24 22 23   GLUCOSE 81 118* 77  BUN 44* 64* 38*  CREATININE 5.26* 7.18* 5.14*  CALCIUM 9.1 9.5 8.8*   LFT Recent Labs    06/14/21 0041 06/15/21 0239  PROT 6.4* 6.3*  ALBUMIN 3.2*   3.1* 3.2*  AST 374* 299*  ALT 336* 301*  ALKPHOS 227* 217*  BILITOT 1.5* 1.6*  BILIDIR 0.6*  --   IBILI 0.9  --    PT/INR No  results for input(s): LABPROT, INR in the last 72 hours. Hepatitis Panel No results for input(s): HEPBSAG, HCVAB, HEPAIGM, HEPBIGM in the last 72 hours.  No results found.  Assessment / Plan:  Ruben [name] is a 76 y.o. male with past medical history significant for hypertension, hyperlipidemia, end-stage renal disease on dialysis, coronary artery disease s/p recent STEMI 04/2021,  heart failure, A-fib with RVR, prominent prior alcohol and tobacco abuse presents with rectal [name] and difficulty swallowing.  Rectal [name] with history of constipation history of L3-L4 discectomy. Rectal thickening per CT. S/P flex sig 2/10 showed mucosal ulceration, diffuse inflammation in the rectum and rectosigmoid colon secondary to proctitis and an anal fissure was identified.  Await biopsy results. Continue Diltiazem 2% PR BID to TID. Continue Anusol HC 49m suppository PR bid Continue Miralax bid as tolerated  Further recommendations per Dr. CCandis Schatz  Elevated LFTs. Hepatomegaly. Hepatic steatosis. Indeterminant lesions on the CT scan of the liver. LFT drifting downward. Alk pho 217. AST 299. ALT 301. T. Bili up from 1.5 -> 1.6.  Hepatic panel in am Abdominal MRI w/wo contrast ordered by Dr. HBenson Norway Ruben completed. Dr. HBenson Norwayto coordinate MRI w/wo contrast on Monday 2/13 with hemodialysis to follow.  Hepatomegaly and steatosis   Ruben. Modified barium swallow study 06/10/2021 without oral pharyngeal difficulties. Regular  solids and thin liquids recommended.    ESRD on hemodialysis ([facility]). Next dialysis due 06/17/2021. Nephrology following   Chronic anemia. Hg 10.1 -> 9.8. FOBT negative. No overt GI bleeding.   Chronic systolic heart failure. Cardiomegaly Ejection fraction 35 to 40% on echocardiogram   Non-STEMI 04/29/2021 Catheterization found occluded RCA and diffuse LAD and left circumflex Ruben amenable to intervention.  Medical managed No chest [name]   Paroxysmal atrial fibrillation. Off  Eliquis. On Heparin IV    Active Problems:   HLD (hyperlipidemia)   ESRD on hemodialysis ([facility])   Weight loss   Suspected acute cholecystitis   Rectal [name] and constipation   Ruben   Paroxysmal atrial fibrillation (HCC)   Chronic systolic CHF (congestive heart failure) (HCC)   ETOH abuse   Normocytic anemia   Transaminitis   CAD (coronary artery disease)   Liver lesions     LOS: 7 days   Ruben [name]  06/15/2021, 3:27 PM   I have reviewed the chart and discussed the patient with NP Kennedy-Smith.  I did Ruben see or examine the patient today. I agree with the APP's note, impression and recommendations  Patient continuing to have rectal discomfort about the same as yesterday, with no evidence of bleeding.  Patient has anal fissures and a solitary rectal ulcer which will take time to heal.  He is on the appropriate medical therapy.  Recommend continuing this for now, with no changes. An MRI liver was ordered to further evaluate indeterminate liver lesions, but had Ruben been performed yet.  Radiology was unclear whether it was truly desired for the MRI to be without contrast.  If desired with contrast, it would need to be coordinated with his dialysis.  As this is a nonurgent issue, will defer this to Dr. Benson Norway on Monday.  Cardarius Senat E. Candis Schatz, MD [facility]

## 2021-06-15 NOTE — Evaluation (Signed)
Physical Therapy Evaluation Patient Details Name: Ruben [name] MRN: [id number] DOB: 1945-12-20 Today's Date: 06/15/2021  History of Present Illness  Pt is 76 yr old M admitted on 06/07/21 with ongoing c/o vomiting/rectal pain. Imaging (+) for bibasilar atelectasis, lesions in liver/kidneys, possible cholecystitis, possible AVN B femoral heads. PMH: arthritis, ESRD on dialysis, ETOH abuse, HTN, MI  Clinical Impression  Pt was previously independent with ADLs and functional mobility, had a rollator but only used it as needed.  Currently, pt is requiring min A for functional mobility with full body tremors noted in standing and poor balance making it unsafe to attempt to step away from bedside.  Of note, this is a significant change from previous day where pt was able to safely amb 400 ft with use of RW with MS.  Overall, pt demos generalized weakness, dec activity tolerance, dec balance and would benefit from skilled PT in acute care to address deficits indicated in initial eval.  PT and family discuss HHPT vs SNF and family is hopeful that pt will be feeling better on next tx and will be able to return home with [facility] PT.  However, they are open to SNF if pt continues to decline in function.       Recommendations for follow up therapy are one component of a multi-disciplinary discharge planning process, led by the attending physician.  Recommendations may be updated based on patient status, additional functional criteria and insurance authorization.  Follow Up Recommendations Other (comment) (Family is hoping for home with [place], but pt demos significant dec in functional mobility compared to previous day with mobility specialist.  PT to f/u and make final decision for home vs SNF on next visit.  Family in agreement.)    Assistance Recommended at Discharge Frequent or constant Supervision/Assistance  Patient can return home with the following  A lot of help with walking and/or transfers;A lot of  help with bathing/dressing/bathroom;Assistance with cooking/housework;Assist for transportation;Help with stairs or ramp for entrance    Equipment Recommendations  (Pt has rollator at home)  Recommendations for Other Services       Functional Status Assessment Patient has had a recent decline in their functional status and demonstrates the ability to make significant improvements in function in a reasonable and predictable amount of time.     Precautions / Restrictions Precautions Precautions: None      Mobility  Bed Mobility Overal bed mobility: Needs Assistance Bed Mobility: Supine to Sit, Sit to Supine     Supine to sit: Min assist Sit to supine: Min assist   General bed mobility comments: Pt requires min A for trunk support to transition from sidelying to sitting EOB.  Req's min A for LE negotiation to transfer BTB.  While sitting on EOB during therex demos inc R sided lean with mm tremors in R arm from supporting body weight. Patient Response: Cooperative  Transfers Overall transfer level: Needs assistance Equipment used: Rolling walker (2 wheels) Transfers: Sit to/from Stand Sit to Stand: Min assist           General transfer comment: Pt. performs sit > stand with min A.  Demos inc mm tremors throughout lower body with difficulty maintaining balance.  Per pt, this is worse than before and medical record confirms that he was previously able to ambulate 400 ft with MS without difficulty.  Pt also c/o inc rectal pain in standing.  Pt. is returned to sitting EOB for LE therex as he is unsafe to attempt  amb at this time.    Ambulation/Gait                  Stairs            Wheelchair Mobility    Modified Rankin (Stroke Patients Only)       Balance Overall balance assessment: Needs assistance Sitting-balance support: Bilateral upper extremity supported, Feet supported Sitting balance-[name] Scale: Poor     Standing balance support: Bilateral upper  extremity supported, During functional activity Standing balance-[name] Scale: Poor                               Pertinent Vitals/Pain Pain Assessment Pain Assessment: 0-10 Pain Score: 8  Pain Location: Rectal pain Pain Descriptors / Indicators: Tender, Discomfort Pain Intervention(s): Limited activity within patient's tolerance, Monitored during session    Home Living Family/patient expects to be discharged to:: Private residence Living Arrangements: Children;Spouse/significant other Available Help at Discharge: Family Type of Home: House Home Access: Stairs to enter Entrance Stairs-Rails: Can reach both Entrance Stairs-Number of Steps: 2   Home Layout: One level Home Equipment: Rollator (4 wheels)      Prior Function Prior Level of Function : Independent/Modified Independent             Mobility Comments: Does not typically use rollator, but has it available if needed.       Hand Dominance        Extremity/Trunk Assessment   Upper Extremity Assessment Upper Extremity Assessment: Defer to OT evaluation    Lower Extremity Assessment Lower Extremity Assessment: Generalized weakness       Communication   Communication: No difficulties  Cognition Arousal/Alertness: Lethargic Behavior During Therapy: WFL for tasks assessed/performed Overall Cognitive Status: Within Functional Limits for tasks assessed                                 General Comments: Pt. is sleeping when PT arrives.  Arouses easily but c/o inc rectal pain, got suppository earlier today.  Agreeable to try amb to help with BM.  Family is present in room.        General Comments      Exercises General Exercises - Lower Extremity Ankle Circles/Pumps: AROM, Both, 10 reps Long Arc Quad: AAROM, Both, 10 reps Hip Flexion/Marching: AROM, Both, 10 reps   Assessment/Plan    PT Assessment Patient needs continued PT services  PT Problem List Decreased  strength;Decreased mobility;Decreased balance;Decreased activity tolerance;Decreased knowledge of use of DME;Pain       PT Treatment Interventions DME instruction;Therapeutic exercise;Gait training;Balance training;Functional mobility training;Therapeutic activities;Patient/family education    PT Goals (Current goals can be found in the Care Plan section)  Acute Rehab PT Goals Patient Stated Goal: Pt's goal is to decrease pain PT Goal Formulation: With patient Time For Goal Achievement: 06/29/21 Potential to Achieve Goals: Good    Frequency Min 3X/week     Co-evaluation               AM-PAC PT "6 Clicks" Mobility  Outcome Measure Help needed turning from your back to your side while in a flat bed without using bedrails?: None Help needed moving from lying on your back to sitting on the side of a flat bed without using bedrails?: A Little Help needed moving to and from a bed to a chair (including a wheelchair)?: A Little  Help needed standing up from a chair using your arms (e.g., wheelchair or bedside chair)?: A Little Help needed to walk in hospital room?: A Lot Help needed climbing 3-5 steps with a railing? : A Lot 6 Click Score: 17    End of Session Equipment Utilized During Treatment: Gait belt Activity Tolerance: Patient limited by fatigue;Patient limited by pain Patient left: in bed;with call bell/phone within reach;with bed alarm set;with family/visitor present   PT Visit Diagnosis: Unsteadiness on feet (R26.81);Other abnormalities of gait and mobility (R26.89);Pain    Time: 4619-0122 PT Time Calculation (min) (ACUTE ONLY): 23 min   Charges:   PT Evaluation $PT Eval Low Complexity: 1 Low PT Treatments $Therapeutic Exercise: 8-22 mins        Shyheim Tanney A. Huxton Glaus, PT, DPT Acute Rehabilitation Services Office: [place] 06/15/2021, 1:08 PM

## 2021-06-15 NOTE — Progress Notes (Signed)
Pt had a small amount amount of sludge like bowel movement this pm

## 2021-06-15 NOTE — Progress Notes (Signed)
ANTICOAGULATION CONSULT NOTE  Pharmacy Consult for Heparin Indication: atrial fibrillation  Allergies  Allergen Reactions   Other Other (See Comments)    Pollen - sneezing, itching/watery eyes    Patient Measurements: Height: 5\' 11"  (180.3 cm) Weight: 71.2 kg (156 lb 15.5 oz) IBW/kg (Calculated) : 75.3 kg Heparin Dosing Weight: 78 kg  Vital Signs: Temp: 97.8 F (36.6 C) (02/11 0808) BP: 92/51 (02/11 0808) Pulse Rate: 79 (02/11 0808)  Labs: Recent Labs    06/13/21 0115 06/14/21 0041 06/15/21 0239 06/15/21 1206  HGB 10.1* 10.1* 9.8*  --   HCT 30.6* 31.2* 29.8*  --   PLT 165 196 168  --   APTT 112* 130* 71* 61*  HEPARINUNFRC >1.10* >1.10* >1.10* >1.10*  CREATININE 5.26* 7.18* 5.14*  --      Estimated Creatinine Clearance: 12.5 mL/min (A) (by C-G formula based on SCr of 5.14 mg/dL (H)).  Assessment: 76 yo male with a history of atrial fibrillation and CAD presents with nausea, vomiting, rectal pain, and worsening abdominal pain. PTA the patient is on apixaban, last dose on 2/3 in the evening (per the patient and family members). Pharmacy is consulted to dose heparin.  Of note, the patient had a recent admission in 04/2021 for NSTEMI, cardiogenic shock, acute systolic CHF, mitral regurgitation, and aortic valve insufficiency.  2/11 update:  aPTT is at 61- slightly below goal, HL >1.10, Will continue to dose based on aPTT until both levels are correlating  Goal of Therapy:  Heparin level 0.3-0.7 units/ml aPTT 66-102 seconds Monitor platelets by anticoagulation protocol: Yes   Plan:  Increase heparin to 650 units/hr 2100 aPTT and heparin level  Lestine Box, PharmD PGY2 Infectious Diseases Pharmacy Resident   Please check [email or web address] for unit-specific pharmacy phone numbers

## 2021-06-15 NOTE — Progress Notes (Signed)
ANTICOAGULATION CONSULT NOTE  Pharmacy Consult for Heparin Indication: atrial fibrillation  Allergies  Allergen Reactions   Other Other (See Comments)    Pollen - sneezing, itching/watery eyes    Patient Measurements: Height: 5\' 11"  (180.3 cm) Weight: 70.6 kg (155 lb 10.3 oz) IBW/kg (Calculated) : 75.3 kg Heparin Dosing Weight: 78 kg  Vital Signs: Temp: 98.5 F (36.9 C) (02/10 1820) BP: 107/52 (02/10 1820) Pulse Rate: 71 (02/10 1820)  Labs: Recent Labs    06/13/21 0115 06/14/21 0041 06/15/21 0239  HGB 10.1* 10.1* 9.8*  HCT 30.6* 31.2* 29.8*  PLT 165 196 168  APTT 112* 130* 71*  HEPARINUNFRC >1.10* >1.10* >1.10*  CREATININE 5.26* 7.18* 5.14*     Estimated Creatinine Clearance: 12.4 mL/min (A) (by C-G formula based on SCr of 5.14 mg/dL (H)).  Assessment: 76 yo male with a history of atrial fibrillation and CAD presents with nausea, vomiting, rectal pain, and worsening abdominal pain. PTA the patient is on apixaban, last dose on 2/3 in the evening (per the patient and family members). Pharmacy is consulted to dose heparin.  Of note, the patient had a recent admission in 04/2021 for NSTEMI, cardiogenic shock, acute systolic CHF, mitral regurgitation, and aortic valve insufficiency.  AM update - per discussion with RN, patient's lost IV access overnight, and IV team to replace. Heparin has been off, and RN to notify when resumed. Patient scheduled for dialysis later today and FFS at 1200 with GI with plans to hold heparin at 1000 for procedure. aPTT had trended up to 130 on heparin reduced to 550 units/hr - likely will improve once patient has HD. CBC stable. No bleeding issues per discussion with RN.  2/11 AM update:  aPTT therapeutic   Goal of Therapy:  Heparin level 0.3-0.7 units/ml aPTT 66-102 seconds Monitor platelets by anticoagulation protocol: Yes   Plan:  Cont heparin at 500 units/hr 1200 aPTT and heparin level  Narda Bonds, PharmD, BCPS Clinical  Pharmacist Phone: [phone]

## 2021-06-15 NOTE — Progress Notes (Signed)
[place] for Heparin Indication: atrial fibrillation Brief A/P: aPTT within goal range Continue Heparin at current rate   Allergies  Allergen Reactions   Other Other (See Comments)    Pollen - sneezing, itching/watery eyes    Patient Measurements: Height: 5\' 11"  (180.3 cm) Weight: 71.2 kg (156 lb 15.5 oz) IBW/kg (Calculated) : 75.3 kg Heparin Dosing Weight: 78 kg  Vital Signs: Temp: 97.6 F (36.4 C) (02/11 1943) Temp Source: Oral (02/11 1943) BP: 94/66 (02/11 1943) Pulse Rate: 87 (02/11 1943)  Labs: Recent Labs    06/13/21 0115 06/14/21 0041 06/15/21 0239 06/15/21 1206 06/15/21 2046  HGB 10.1* 10.1* 9.8*  --   --   HCT 30.6* 31.2* 29.8*  --   --   PLT 165 196 168  --   --   APTT 112* 130* 71* 61* 101*  HEPARINUNFRC >1.10* >1.10* >1.10* >1.10* >1.10*  CREATININE 5.26* 7.18* 5.14*  --   --      Estimated Creatinine Clearance: 12.5 mL/min (A) (by C-G formula based on SCr of 5.14 mg/dL (H)).  Assessment: 76 y.o. male with h/o Afib, Eliquis on hold, for heparin  Goal of Therapy:  Heparin level 0.3-0.7 units/ml aPTT 66-102 seconds Monitor platelets by anticoagulation protocol: Yes   Plan:  Continue Heparin at current rate   Phillis Knack, PharmD, BCPS

## 2021-06-15 NOTE — Evaluation (Signed)
Occupational Therapy Evaluation Patient Details Name: Ruben [name] MRN: [id number] DOB: [birth year] Today's Date: 06/15/2021   History of Present Illness Pt is 76 yr old M admitted on 06/07/21 with ongoing c/o vomiting/rectal pain. Imaging (+) for bibasilar atelectasis, lesions in liver/kidneys, possible cholecystitis, possible AVN B femoral heads. PMH: arthritis, ESRD on dialysis, ETOH abuse, HTN, MI   Clinical Impression   Pt reports independence at baseline with ADLs, uses rollator occasionally if needed. Pt lives with family who can assist at d/c. Currently, pt min-mod A for ADLs, min guard for bed mobility with increased time. Min guard for stand pivot transfer with RW. Pt needing increased time and cuing this session due to pain, pain/lethargy has affected his mobility compared to previous level of function with [employer] earlier this week. Pt presenting with impairments listed below, will follow. If pt able to regain strength/mobiilty/ADL function, safe to d/c home with assist and HHOT, if not may need SNF level therapy.     Recommendations for follow up therapy are one component of a multi-disciplinary discharge planning process, led by the attending physician.  Recommendations may be updated based on patient status, additional functional criteria and insurance authorization.   Follow Up Recommendations  Home health OT (pt and family wanting HHOT, given pt can regain strength/mobility and ADLs will be HHOT appropriate. If unable may need SNF level therapy at d/c.)    Assistance Recommended at Discharge Intermittent Supervision/Assistance  Patient can return home with the following A lot of help with walking and/or transfers;A lot of help with bathing/dressing/bathroom;Assistance with cooking/housework;Help with stairs or ramp for entrance;Assist for transportation    Functional Status Assessment  Patient has had a recent decline in their functional status and demonstrates the  ability to make significant improvements in function in a reasonable and predictable amount of time.  Equipment Recommendations  BSC/3in1    Recommendations for Other Services       Precautions / Restrictions Precautions Precautions: None Restrictions Weight Bearing Restrictions: No      Mobility Bed Mobility Overal bed mobility: Needs Assistance Bed Mobility: Supine to Sit, Sit to Supine     Supine to sit: Min guard Sit to supine: Min guard        Transfers Overall transfer level: Needs assistance Equipment used: Rolling walker (2 wheels) Transfers: Sit to/from Stand Sit to Stand: Min guard           General transfer comment: increased time needed due to fatigue and pain      Balance Overall balance assessment: Needs assistance Sitting-balance support: Bilateral upper extremity supported, Feet supported Sitting balance-[name] Scale: Fair     Standing balance support: Bilateral upper extremity supported, During functional activity Standing balance-[name] Scale: Poor                             ADL either performed or assessed with clinical judgement   ADL Overall ADL's : Needs assistance/impaired Eating/Feeding: Set up;Sitting   Grooming: Set up;Sitting   Upper Body Bathing: Moderate assistance;Sitting   Lower Body Bathing: Maximal assistance;Sitting/lateral leans   Upper Body Dressing : Moderate assistance;Sitting   Lower Body Dressing: Maximal assistance;Sitting/lateral leans   Toilet Transfer: BSC/3in1;Rolling walker (2 wheels);Stand-pivot;Minimal assistance   Toileting- Clothing Manipulation and Hygiene: Supervision/safety;Sit to/from stand Toileting - Clothing Manipulation Details (indicate cue type and reason): pericare completed in standing     Functional mobility during ADLs: Min guard;Rolling walker (2 wheels)  Vision   Vision Assessment?: No apparent visual deficits     Perception     Praxis      Pertinent  Vitals/Pain Pain Assessment Pain Assessment: Faces Pain Score: 7  Faces Pain Scale: Hurts whole lot Pain Location: Rectal pain Pain Descriptors / Indicators: Tender, Discomfort, Burning Pain Intervention(s): Limited activity within patient's tolerance, Repositioned     Hand Dominance     Extremity/Trunk Assessment Upper Extremity Assessment Upper Extremity Assessment: Generalized weakness   Lower Extremity Assessment Lower Extremity Assessment: Defer to PT evaluation   Cervical / Trunk Assessment Cervical / Trunk Assessment: Normal   Communication Communication Communication: No difficulties   Cognition Arousal/Alertness: Lethargic Behavior During Therapy: WFL for tasks assessed/performed Overall Cognitive Status: Within Functional Limits for tasks assessed                                       General Comments  family in room during session    Exercises     Shoulder Instructions      Home Living Family/patient expects to be discharged to:: Private residence Living Arrangements: Children;Spouse/significant other Available Help at Discharge: Family Type of Home: House Home Access: Stairs to enter Technical brewer of Steps: 2 Entrance Stairs-Rails: Can reach both Home Layout: One level     Bathroom Shower/Tub: Tub/shower unit;Door         Home Equipment: Rollator (4 wheels)          Prior Functioning/Environment Prior Level of Function : Independent/Modified Independent             Mobility Comments: Does not typically use rollator, but has it available if needed. ADLs Comments: does IADLs        OT Problem List: Decreased strength;Decreased range of motion;Decreased activity tolerance;Decreased knowledge of use of DME or AE;Pain;Impaired balance (sitting and/or standing)      OT Treatment/Interventions: Therapeutic exercise;Self-care/ADL training;DME and/or AE instruction;Therapeutic activities;Patient/family  education;Balance training    OT Goals(Current goals can be found in the care plan section) Acute Rehab OT Goals Patient Stated Goal: decrease pain OT Goal Formulation: With patient Time For Goal Achievement: 06/29/21 Potential to Achieve Goals: Good ADL Goals Pt Will Perform Upper Body Dressing: with min assist;sitting Pt Will Perform Lower Body Dressing: with mod assist;with min assist;sit to/from stand Pt Will Transfer to Toilet: bedside commode;squat pivot transfer;with supervision  OT Frequency: Min 2X/week    Co-evaluation              AM-PAC OT "6 Clicks" Daily Activity     Outcome Measure Help from another person eating meals?: None Help from another person taking care of personal grooming?: A Little Help from another person toileting, which includes using toliet, bedpan, or urinal?: A Lot Help from another person bathing (including washing, rinsing, drying)?: A Lot Help from another person to put on and taking off regular upper body clothing?: A Lot Help from another person to put on and taking off regular lower body clothing?: Total 6 Click Score: 14   End of Session Equipment Utilized During Treatment: Gait belt;Rolling walker (2 wheels);Oxygen Nurse Communication: Mobility status;Other (comment) (pt had BM)  Activity Tolerance: Patient tolerated treatment well Patient left: in bed;with call bell/phone within reach;with bed alarm set;with family/visitor present  OT Visit Diagnosis: Unsteadiness on feet (R26.81);Other abnormalities of gait and mobility (R26.89);Muscle weakness (generalized) (M62.81);Pain Pain - part of body:  (rectal pain)  Time: 1003-4961 OT Time Calculation (min): 35 min Charges:  OT General Charges $OT Visit: 1 Visit OT Evaluation $OT Eval Moderate Complexity: 1 Mod OT Treatments $Self Care/Home Management : 8-22 mins  Lynnda Child, OTD, OTR/L Acute Rehab [phone]) [phone] - [place] 06/15/2021, 4:43 PM

## 2021-06-16 ENCOUNTER — Encounter (HOSPITAL_COMMUNITY): Payer: Self-pay | Admitting: Gastroenterology

## 2021-06-16 DIAGNOSIS — K6289 Other specified diseases of anus and rectum: Secondary | ICD-10-CM | POA: Diagnosis not present

## 2021-06-16 DIAGNOSIS — R7989 Other specified abnormal findings of blood chemistry: Secondary | ICD-10-CM

## 2021-06-16 LAB — APTT
aPTT: >200 seconds (ref 24–36)
aPTT: 36 seconds (ref 24–36)

## 2021-06-16 LAB — COMPREHENSIVE METABOLIC PANEL
ALT: 253 U/L — ABNORMAL HIGH (ref 0–44)
AST: 208 U/L — ABNORMAL HIGH (ref 15–41)
Albumin: 3 g/dL — ABNORMAL LOW (ref 3.5–5.0)
Alkaline Phosphatase: 190 U/L — ABNORMAL HIGH (ref 38–126)
Anion gap: 15 (ref 5–15)
BUN: 56 mg/dL — ABNORMAL HIGH (ref 8–23)
CO2: 24 mmol/L (ref 22–32)
Calcium: 9.2 mg/dL (ref 8.9–10.3)
Chloride: 98 mmol/L (ref 98–111)
Creatinine, Ser: 6.93 mg/dL — ABNORMAL HIGH (ref 0.61–1.24)
GFR, Estimated: 8 mL/min — ABNORMAL LOW (ref >60)
Glucose, Bld: 107 mg/dL — ABNORMAL HIGH (ref 70–99)
Potassium: 4.1 mmol/L (ref 3.5–5.1)
Sodium: 137 mmol/L (ref 135–145)
Total Bilirubin: 1.2 mg/dL (ref 0.3–1.2)
Total Protein: 5.8 g/dL — ABNORMAL LOW (ref 6.5–8.1)

## 2021-06-16 LAB — CBC
HCT: 29.1 % — ABNORMAL LOW (ref 39.0–52.0)
Hemoglobin: 9.7 g/dL — ABNORMAL LOW (ref 13.0–17.0)
MCH: 33 pg (ref 26.0–34.0)
MCHC: 33.3 g/dL (ref 30.0–36.0)
MCV: 99 fL (ref 80.0–100.0)
Platelets: 156 10*3/uL (ref 150–400)
RBC: 2.94 MIL/uL — ABNORMAL LOW (ref 4.22–5.81)
RDW: 18.6 % — ABNORMAL HIGH (ref 11.5–15.5)
WBC: 7.1 10*3/uL (ref 4.0–10.5)
nRBC: 3.9 % — ABNORMAL HIGH (ref 0.0–0.2)

## 2021-06-16 LAB — GLUCOSE, CAPILLARY
Glucose-Capillary: 137 mg/dL — ABNORMAL HIGH (ref 70–99)
Glucose-Capillary: 124 mg/dL — ABNORMAL HIGH (ref 70–99)
Glucose-Capillary: 127 mg/dL — ABNORMAL HIGH (ref 70–99)

## 2021-06-16 LAB — HEPARIN LEVEL (UNFRACTIONATED): Heparin Unfractionated: >1.1 IU/mL — ABNORMAL HIGH (ref 0.30–0.70)

## 2021-06-16 NOTE — Progress Notes (Signed)
PROGRESS NOTE  Ruben [name] FTD:[id number] DOB: [birth year] DOA: 06/07/2021 PCP: [name], FNP  HPI/Recap of past 24 hours: 76 y.o. male with medical history significant of hypertension, hyperlipidemia, ESRD on HD MWF, CAD, recent hospitalization for non-STEMI and acute systolic CHF with cardiogenic shock requiring ICU stay and inotrope support, A. fib with RVR on Eliquis, prior alcohol abuse, and tobacco abuse who presents with complaints of rectal pain and difficulty swallowing over the last 2 weeks.  His most recent hospital stay was prolonged, he was here for almost a month.  He has declined since, with weight loss, poor p.o. intake, dysphagia as well as increased constipation.  There was concern for acute cholecystitis on admission and general surgery was consulted.  Initial imaging showed some nonspecific liver lesions, on a noncontrast study, and also patient complained of dysphagia for which GI was consulted.  Hospital course complicated by worsening elevated chemistries, and severe proctitis, proctalgia.  Post flexible sigmoidoscopy on 06/14/2021 by Dr. Benson Norway which revealed Mucosal ulceration, diffuse moderate inflammation was found in the rectum and in the recto-sigmoid colon secondary to proctitis. Biopsied. Anal fissure found on perianal exam.  06/16/21: Patient was seen at his bedside.  Endorses he feels depressed and anxious, ongoing since his acute MI 1 month ago.  Denies any suicidal ideation.  His rectal pain is 6 out of 10 when not having a bowel movement.     Assessment/Plan: Active Problems:   HLD (hyperlipidemia)   ESRD on hemodialysis (HCC)   Weight loss   Suspected acute cholecystitis   Rectal pain and constipation   Dysphagia   Paroxysmal atrial fibrillation (HCC)   Chronic systolic CHF (congestive heart failure) (HCC)   ETOH abuse   Normocytic anemia   Transaminitis   CAD (coronary artery disease)   Liver lesions  Proctalgia (rectal pain) and  constipation- (present on admission) Seen by GI, status post manual disimpaction.  Continue bowel regimen to avoid recurrent constipation. Currently on Anusol for rectal pain. Moderate fold thickening in the rectum, seen on CT scan abdomen and pelvis with contrast done on 06/11/2021.  Plan for flexible sigmoidoscopy on 06/14/2021 due to refractory proctalgia. Post flexible sigmoidoscopy on 06/14/2021 by Dr. Benson Norway which revealed Mucosal ulceration, diffuse moderate inflammation was found in the rectum and in the recto-sigmoid colon secondary to proctitis. Biopsied.  Anal fissure found on perianal exam. Continue pain management and bowel regimen Pain improved today.  Post MI depression/anxiety Endorses feeling depressed and anxious since his acute MI 1 month ago. Denies any suicidal ideation Psychiatry consulted to assist with the management.  Resolved hypotensive, on chronic midodrine Continue home midodrine 10 mg 3 times daily Maintain MAP greater than 65 Continue to closely monitor vital signs.  Transaminitis/elevated liver chemistries- (present on admission) Liver chemistries are persistently elevated Continue to avoid hepatotoxic agents. Liver lesions seen on CT scan Follow MRI liver with and without contrast, ordered by GI, follow results. Management per GI.   Acute on chronic systolic CHF (congestive heart failure) ([place])- (present on admission) -Following NSTEMI in January 2023 patient was noted to have gone into cardiogenic shock and required temporary stay in ICU requiring pressors which were able to be weaned off and he was transition to midodrine.   2D echo done on 05/13/2021 revealed EF 35-40% with moderate mitral regurgitation.  Volume status management hemodialysis.   Continue strict I's and O's and daily weight.   Liver lesions, follow-up MRI liver - CT scan on admission showed low-attenuation lesions  measuring about 12 mm diameter in the liver.   Keep on heparin drip in case  a biopsy is needed Follow MRI liver with and without contrast, ordered by GI.   CAD (coronary artery disease)- (present on admission) -Patient had recent NSTEMI 04/2021 for which he underwent cardiac catheterization noting RCA occlusion with diffuse disease of the LAD and left circumflex.  It was not amendable to PCI and he was not a surgical candidate for CABG.  Home statin and amiodarone held due to elevated liver chemistries. Denies any anginal symptoms.   Paroxysmal atrial fibrillation (HCC) -Patient appears to be in sinus rhythm at this time and rate controlled. Held home Eliquis. Heparin drip per pharmacy for possibility of needed procedure. Amiodarone on hold due to elevated liver chemistries. Rate is currently controlled.   ESRD on hemodialysis (HCC)MWF -He has new end-stage renal disease following his most recent hospital stay.  He was discharged January 2023.  Currently MWF.  Just had an AV fistula placed, it has not matured and currently having dialysis via HD cath.   Hemodialysis on 06/12/2021 and 06/15/2021. Next dialysis 06/17/2021.   Chronic normocytic anemia/anemia of chronic disease- (present on admission) -Hemoglobin stable 10.1 from 10.1 No overt bleeding. Iron studies done on 06/10/2021, no iron deficiency.   ETOH abuse -Patient has not not drank alcohol since he was admitted to the hospital in December 2022. Continue to encourage abstinence from alcohol   Dysphagia -Patient complains of food and liquids getting stuck in his throat causing him to vomit it back up.  Denies complaints of coughing while eating.  He has a prior history of alcohol abuse.  Management per gastroenterology.   Suspected acute cholecystitis -Patient presented with complaints of abdominal pain with nausea and vomiting on admission.  CT scan of the abdomen pelvis significant for increased density in the gallbladder concerning for milk of calcium or large gallstones with gallbladder wall thickening.   General surgery has been consulted and evaluated patient, and they do not feel that imaging and physical exam and symptoms are consistent with acute cholecystitis.  They signed off.  He was initially placed on ceftriaxone but it has been discontinued now.   Weight loss -family reports 20-30 pound weight loss over the last month.  Question if multifactorial in the recent NSTEMI, ESRD, and constipation with poor p.o. intake.  TSH unremarkable.  Nonspecific liver lesions noted on the CT scan are concerning, GI to weigh in   HLD (hyperlipidemia)- (present on admission) -Hold off statin with uptrending liver chemistries   Severe protein calorie malnutrition Liberate diet as recommended by dietitian Oral supplements  Elevated alkaline phosphatase Downtrending. Continue to monitor  Resolved hypervolemic hyponatremia Serum sodium 138 from 134 Electrolytes and volume status addressed with hemodialysis   Critical care time: 65 minutes.   Code Status: Full Code   Family Communication: Wife at bedside.   Status is: Inpatient   Remains inpatient appropriate because: Severity of illness     Level of care: Telemetry Medical   Consultants:  Gastroenterology General surgery Nephrology Psychiatry   Procedures:  Hemodialysis. Flexible sigmoidoscopy.   Microbiology  none    Objective: Vitals:   06/15/21 1943 06/16/21 0335 06/16/21 0500 06/16/21 0747  BP: 94/66 100/61  97/63  Pulse: 87 82  89  Resp: 17 19  18   Temp: 97.6 F (36.4 C) 97.6 F (36.4 C)  97.6 F (36.4 C)  TempSrc: Oral Oral  Oral  SpO2: 100% 98%  100%  Weight:  72.7 kg   Height:        Intake/Output Summary (Last 24 hours) at 06/16/2021 1302 Last data filed at 06/16/2021 0818 Gross per 24 hour  Intake 583.29 ml  Output --  Net 583.29 ml   Filed Weights   06/14/21 1820 06/15/21 0500 06/16/21 0500  Weight: 70.6 kg 71.2 kg 72.7 kg    Exam:  General: 76 y.o. year-old male frail-appearing in no acute  distress.  Flat affect.  Oriented x3. Cardiovascular: Regular rate and rhythm. Respiratory: Clear to auscultation. Abdomen: Soft monitor normal bowel sounds present. Musculoskeletal: No lower extremity edema bilaterally. Psychiatry: Flat affect. Neuro: Nonfocal exam.   Data Reviewed: CBC: Recent Labs  Lab 06/12/21 0402 06/13/21 0115 06/14/21 0041 06/15/21 0239 06/16/21 0106  WBC 8.9 8.0 10.5 6.7 7.1  HGB 9.3* 10.1* 10.1* 9.8* 9.7*  HCT 27.5* 30.6* 31.2* 29.8* 29.1*  MCV 95.5 96.8 97.5 99.0 99.0  PLT 184 165 196 168 937   Basic Metabolic Panel: Recent Labs  Lab 06/10/21 0203 06/11/21 0649 06/12/21 0402 06/13/21 0115 06/14/21 0041 06/15/21 0239 06/16/21 0106  NA 134* 139 134* 135 134* 138 137  K 4.8 4.1 4.3 4.5 5.0 4.0 4.1  CL 92* 97* 95* 96* 95* 97* 98  CO2 26 27 22 24 22 23 24   GLUCOSE 132* 108* 98 81 118* 77 107*  BUN 69* 44* 63* 44* 64* 38* 56*  CREATININE 7.63* 5.19* 6.81* 5.26* 7.18* 5.14* 6.93*  CALCIUM 9.1 8.6* 8.8* 9.1 9.5 8.8* 9.2  PHOS 7.9* 5.8* 6.6* 5.8* 7.7*  --   --    GFR: Estimated Creatinine Clearance: 9.5 mL/min (A) (by C-G formula based on SCr of 6.93 mg/dL (H)). Liver Function Tests: Recent Labs  Lab 06/12/21 0402 06/13/21 0115 06/14/21 0041 06/15/21 0239 06/16/21 0106  AST 311* 411* 374* 299* 208*  ALT 244* 319* 336* 301* 253*  ALKPHOS 196* 287* 227* 217* 190*  BILITOT 1.3* 2.0* 1.5* 1.6* 1.2  PROT 5.6* 6.3* 6.4* 6.3* 5.8*  ALBUMIN 2.9* 3.1*   3.0* 3.2*   3.1* 3.2* 3.0*   No results for input(s): LIPASE, AMYLASE in the last 168 hours.  No results for input(s): AMMONIA in the last 168 hours. Coagulation Profile: No results for input(s): INR, PROTIME in the last 168 hours. Cardiac Enzymes: No results for input(s): CKTOTAL, CKMB, CKMBINDEX, TROPONINI in the last 168 hours. BNP (last 3 results) No results for input(s): PROBNP in the last 8760 hours. HbA1C: No results for input(s): HGBA1C in the last 72 hours. CBG: Recent Labs   Lab 06/16/21 1135  GLUCAP 137*   Lipid Profile: No results for input(s): CHOL, HDL, LDLCALC, TRIG, CHOLHDL, LDLDIRECT in the last 72 hours. Thyroid Function Tests: No results for input(s): TSH, T4TOTAL, FREET4, T3FREE, THYROIDAB in the last 72 hours. Anemia Panel: No results for input(s): VITAMINB12, FOLATE, FERRITIN, TIBC, IRON, RETICCTPCT in the last 72 hours.  Urine analysis:    Component Value Date/Time   COLORURINE YELLOW 05/14/2021 1842   APPEARANCEUR CLEAR 05/14/2021 1842   LABSPEC >1.030 (H) 05/14/2021 1842   PHURINE 5.5 05/14/2021 1842   GLUCOSEU NEGATIVE 05/14/2021 1842   HGBUR NEGATIVE 05/14/2021 1842   BILIRUBINUR NEGATIVE 05/14/2021 1842   BILIRUBINUR negative 01/23/2020 1028   KETONESUR NEGATIVE 05/14/2021 1842   PROTEINUR NEGATIVE 05/14/2021 1842   UROBILINOGEN 1.0 01/23/2020 1028   NITRITE NEGATIVE 05/14/2021 1842   LEUKOCYTESUR NEGATIVE 05/14/2021 1842   Sepsis Labs: @LABRCNTIP (procalcitonin:4,lacticidven:4)  ) Recent Results (from the past 240 hour(s))  Resp Panel by RT-PCR (Flu A&B, Covid) Nasopharyngeal Swab     Status: None   Collection Time: 06/07/21  8:40 PM   Specimen: Nasopharyngeal Swab; Nasopharyngeal(NP) swabs in vial transport medium  Result Value Ref Range Status   SARS Coronavirus 2 by RT PCR NEGATIVE NEGATIVE Final    Comment: (NOTE) SARS-CoV-2 target nucleic acids are NOT DETECTED.  The SARS-CoV-2 RNA is generally detectable in upper respiratory specimens during the acute phase of infection. The lowest concentration of SARS-CoV-2 viral copies this assay can detect is 138 copies/mL. A negative result does not preclude SARS-Cov-2 infection and should not be used as the sole basis for treatment or other patient management decisions. A negative result may occur with  improper specimen collection/handling, submission of specimen other than nasopharyngeal swab, presence of viral mutation(s) within the areas targeted by this assay, and  inadequate number of viral copies(<138 copies/mL). A negative result must be combined with clinical observations, patient history, and epidemiological information. The expected result is Negative.  Fact Sheet for Patients:  EntrepreneurPulse.com.au  Fact Sheet for Healthcare Providers:  IncredibleEmployment.be  This test is no t yet approved or cleared by the [place] FDA and  has been authorized for detection and/or diagnosis of SARS-CoV-2 by FDA under an Emergency Use Authorization (EUA). This EUA will remain  in effect (meaning this test can be used) for the duration of the COVID-19 declaration under Section 564(b)(1) of the Act, 21 U.S.C.section 360bbb-3(b)(1), unless the authorization is terminated  or revoked sooner.       Influenza A by PCR NEGATIVE NEGATIVE Final   Influenza B by PCR NEGATIVE NEGATIVE Final    Comment: (NOTE) The Xpert Xpress SARS-CoV-2/FLU/RSV plus assay is intended as an aid in the diagnosis of influenza from Nasopharyngeal swab specimens and should not be used as a sole basis for treatment. Nasal washings and aspirates are unacceptable for Xpert Xpress SARS-CoV-2/FLU/RSV testing.  Fact Sheet for Patients: EntrepreneurPulse.com.au  Fact Sheet for Healthcare Providers: IncredibleEmployment.be  This test is not yet approved or cleared by the [place] FDA and has been authorized for detection and/or diagnosis of SARS-CoV-2 by FDA under an Emergency Use Authorization (EUA). This EUA will remain in effect (meaning this test can be used) for the duration of the COVID-19 declaration under Section 564(b)(1) of the Act, 21 U.S.C. section 360bbb-3(b)(1), unless the authorization is terminated or revoked.  Performed at [facility], [address] [place], [place] [address]       Studies: No results found.  Scheduled Meds:  (feeding supplement) PROSource  Plus  30 mL Oral TID BM   bisacodyl  10 mg Rectal Daily   Chlorhexidine Gluconate Cloth  6 each Topical Q0600   Chlorhexidine Gluconate Cloth  6 each Topical Q0600   darbepoetin (ARANESP) injection - DIALYSIS  60 mcg Intravenous Q Mon-HD   diltiazem   Topical BID   ferric citrate  210 mg Oral TID WC   gabapentin  100 mg Oral BID   hydrocortisone  25 mg Rectal BID   magnesium oxide  200 mg Oral Daily   midodrine  10 mg Oral TID WC   midodrine  10 mg Oral Q M,W,F-HD   multivitamin  1 tablet Oral QHS   polyethylene glycol  17 g Oral BID   senna-docusate  2 tablet Oral BID   simethicone  80 mg Oral QID   sodium chloride flush  3 mL Intravenous Q12H    Continuous Infusions:  heparin 650 Units/hr (06/15/21 1409)   promethazine (PHENERGAN) injection (IM or IVPB) Stopped (06/13/21 2334)     LOS: 8 days     Kayleen Memos, MD [facility] Pager [phone]  If 7PM-7AM, please contact night-coverage [email or web address] Password [facility] 06/16/2021, 1:02 PM

## 2021-06-16 NOTE — Progress Notes (Signed)
ANTICOAGULATION CONSULT NOTE  Pharmacy Consult for Heparin Indication: atrial fibrillation  Allergies  Allergen Reactions   Other Other (See Comments)    Pollen - sneezing, itching/watery eyes    Patient Measurements: Height: 5\' 11"  (180.3 cm) Weight: 72.7 kg (160 lb 4.4 oz) IBW/kg (Calculated) : 75.3 kg Heparin Dosing Weight: 78 kg  Vital Signs: Temp: 97.6 F (36.4 C) (02/12 0335) Temp Source: Oral (02/12 0335) BP: 100/61 (02/12 0335) Pulse Rate: 82 (02/12 0335)  Labs: Recent Labs    06/14/21 0041 06/15/21 0239 06/15/21 1206 06/15/21 2046 06/16/21 0106  HGB 10.1* 9.8*  --   --  9.7*  HCT 31.2* 29.8*  --   --  29.1*  PLT 196 168  --   --  156  APTT 130* 71* 61* 101* >200*  HEPARINUNFRC >1.10* >1.10* >1.10* >1.10* >1.10*  CREATININE 7.18* 5.14*  --   --  6.93*     Estimated Creatinine Clearance: 9.5 mL/min (A) (by C-G formula based on SCr of 6.93 mg/dL (H)).  Assessment: 76 yo male with a history of atrial fibrillation and CAD presents with nausea, vomiting, rectal pain, and worsening abdominal pain. PTA the patient is on apixaban, last dose on 2/3 in the evening (per the patient and family members). Pharmacy is consulted to dose heparin.  Of note, the patient had a recent admission in 04/2021 for NSTEMI, cardiogenic shock, acute systolic CHF, mitral regurgitation, and aortic valve insufficiency.  2/12 update:  aPTT >200, HL >1.10. Discussed with nursing- no signs or symptoms of bleeding and no issues during infusion. Hold infusion for 1 hour and will resume at lower infusion rate.   Goal of Therapy:  Heparin level 0.3-0.7 units/ml aPTT 66-102 seconds Monitor platelets by anticoagulation protocol: Yes   Plan:  Hold heparin infusion for 1 hour  Decrease infusion rate of Heparin to 500 units/hr 1600 aPTT level Monitor for signs and symptoms of bleeding  Lestine Box, PharmD PGY2 Infectious Diseases Pharmacy Resident   Please check [email or web address] for  unit-specific pharmacy phone numbers

## 2021-06-16 NOTE — Progress Notes (Signed)
ANTICOAGULATION CONSULT NOTE  Pharmacy Consult for Heparin Indication: atrial fibrillation  Allergies  Allergen Reactions   Other Other (See Comments)    Pollen - sneezing, itching/watery eyes    Patient Measurements: Height: 5\' 11"  (180.3 cm) Weight: 72.7 kg (160 lb 4.4 oz) IBW/kg (Calculated) : 75.3 kg Heparin Dosing Weight: 78 kg  Vital Signs: Temp: 97.5 F (36.4 C) (02/12 1931) Temp Source: Oral (02/12 1931) BP: 105/63 (02/12 1931) Pulse Rate: 72 (02/12 1931)  Labs: Recent Labs    06/14/21 0041 06/15/21 0239 06/15/21 1206 06/15/21 2046 06/16/21 0106 06/16/21 2114  HGB 10.1* 9.8*  --   --  9.7*  --   HCT 31.2* 29.8*  --   --  29.1*  --   PLT 196 168  --   --  156  --   APTT 130* 71* 61* 101* >200* 36  HEPARINUNFRC >1.10* >1.10* >1.10* >1.10* >1.10*  --   CREATININE 7.18* 5.14*  --   --  6.93*  --      Estimated Creatinine Clearance: 9.5 mL/min (A) (by C-G formula based on SCr of 6.93 mg/dL (H)).  Assessment: 76 yo male with a history of atrial fibrillation and CAD presents with nausea, vomiting, rectal pain, and worsening abdominal pain. PTA the patient is on apixaban, last dose on 2/3 in the evening (per the patient and family members). Pharmacy is consulted to dose heparin.  Of note, the patient had a recent admission in 04/2021 for NSTEMI, cardiogenic shock, acute systolic CHF, mitral regurgitation, and aortic valve insufficiency.  2/12 update:  aPTT >200, HL >1.10 earlier this am  - now concern for lab error bc aptt 32 sec all the way to baseline on heparin drip 500 uts/hr     Goal of Therapy:  Heparin level 0.3-0.7 units/ml aPTT 66-102 seconds Monitor platelets by anticoagulation protocol: Yes   Plan:  Increase infusion rate of Heparin to 600 units/hr Daily aptt heparin level and CBC  Monitor for signs and symptoms of bleeding    Bonnita Nasuti Pharm.D. CPP, BCPS Clinical Pharmacist [phone] 06/16/2021 9:58 PM     Please check [email or web address] for  unit-specific pharmacy phone numbers

## 2021-06-16 NOTE — Progress Notes (Signed)
°  GI Progress Note Covering for Drs. [place]   Subjective  Feels about the same. Rectal pain persists. Small bowel movement today.    Objective  Vital signs in last 24 hours: Temp:  [97.6 F (36.4 C)-98.2 F (36.8 C)] 97.6 F (36.4 C) (02/12 0747) Pulse Rate:  [82-90] 89 (02/12 0747) Resp:  [17-19] 18 (02/12 0747) BP: (94-112)/(57-66) 97/63 (02/12 0747) SpO2:  [96 %-100 %] 100 % (02/12 0747) Weight:  [72.7 kg] 72.7 kg (02/12 0500) Last BM Date: 06/15/21  General: Alert, well-developed, chronically ill appearing in NAD Heart:  Regular rate and rhythm; no murmurs Chest: Clear to ascultation bilaterally Abdomen:  Soft, nontender and nondistended. Normal bowel sounds, without guarding, and without rebound.   Extremities:  Without edema. Neurologic:  Alert and  oriented x4; grossly normal neurologically. Psych:  Alert and cooperative. Normal mood and affect.  Intake/Output from previous day: 02/11 0701 - 02/12 0700 In: 403.3 [P.O.:240; I.V.:163.3] Out: -  Intake/Output this shift: Total I/O In: 180 [P.O.:180] Out: -   Lab Results: Recent Labs    06/14/21 0041 06/15/21 0239 06/16/21 0106  WBC 10.5 6.7 7.1  HGB 10.1* 9.8* 9.7*  HCT 31.2* 29.8* 29.1*  PLT 196 168 156   BMET Recent Labs    06/14/21 0041 06/15/21 0239 06/16/21 0106  NA 134* 138 137  K 5.0 4.0 4.1  CL 95* 97* 98  CO2 22 23 24   GLUCOSE 118* 77 107*  BUN 64* 38* 56*  CREATININE 7.18* 5.14* 6.93*  CALCIUM 9.5 8.8* 9.2   LFT Recent Labs    06/14/21 0041 06/15/21 0239 06/16/21 0106  PROT 6.4*   < > 5.8*  ALBUMIN 3.2*   3.1*   < > 3.0*  AST 374*   < > 208*  ALT 336*   < > 253*  ALKPHOS 227*   < > 190*  BILITOT 1.5*   < > 1.2  BILIDIR 0.6*  --   --   IBILI 0.9  --   --    < > = values in this interval not displayed.      Assessment & Recommendations   Rectal pain due to rectal ulcer, anal fissure, constipation. Continue current medications.  Elevated LFTs, hepatomegaly, hepatic  steatosis, liver lesions on CT. MRI with contrast to be reordered to coordinate with HD schedule - per Dr. Benson Norway  Oropharyngeal dysphagia.  Chronic anemia, Hgb slightly decreased to 9.7.   Dr. Benson Norway will resume his GI care on Monday.   LOS: 8 days   Cyrilla Durkin T. Fuller Plan  06/16/2021, 12:08 PM See Shea Evans, [facility], to contact our on call provider

## 2021-06-16 NOTE — Progress Notes (Addendum)
[facility] Progress Note   Subjective:    Seen in room.    Objective Vitals:   06/15/21 1943 06/16/21 0335 06/16/21 0500 06/16/21 0747  BP: 94/66 100/61  97/63  Pulse: 87 82  89  Resp: 17 19  18   Temp: 97.6 F (36.4 C) 97.6 F (36.4 C)  97.6 F (36.4 C)  TempSrc: Oral Oral  Oral  SpO2: 100% 98%  100%  Weight:   72.7 kg   Height:       Physical Exam General:chronically ill appearing male in NAD Heart:RRR, no mrg Lungs:CTAB, nml WOB on 2L via [place] Abdomen:soft, NTND Extremities:no LE edema Dialysis Access: TDC, LU AVF maturing +b/t  Filed Weights   06/14/21 1820 06/15/21 0500 06/16/21 0500  Weight: 70.6 kg 71.2 kg 72.7 kg    Intake/Output Summary (Last 24 hours) at 06/16/2021 1108 Last data filed at 06/16/2021 0818 Gross per 24 hour  Intake 583.29 ml  Output --  Net 583.29 ml     Additional Objective Labs: Basic Metabolic Panel: Recent Labs  Lab 06/12/21 0402 06/13/21 0115 06/14/21 0041 06/15/21 0239 06/16/21 0106  NA 134* 135 134* 138 137  K 4.3 4.5 5.0 4.0 4.1  CL 95* 96* 95* 97* 98  CO2 22 24 22 23 24   GLUCOSE 98 81 118* 77 107*  BUN 63* 44* 64* 38* 56*  CREATININE 6.81* 5.26* 7.18* 5.14* 6.93*  CALCIUM 8.8* 9.1 9.5 8.8* 9.2  PHOS 6.6* 5.8* 7.7*  --   --     Liver Function Tests: Recent Labs  Lab 06/14/21 0041 06/15/21 0239 06/16/21 0106  AST 374* 299* 208*  ALT 336* 301* 253*  ALKPHOS 227* 217* 190*  BILITOT 1.5* 1.6* 1.2  PROT 6.4* 6.3* 5.8*  ALBUMIN 3.2*   3.1* 3.2* 3.0*    No results for input(s): LIPASE, AMYLASE in the last 168 hours.  CBC: Recent Labs  Lab 06/12/21 0402 06/13/21 0115 06/14/21 0041 06/15/21 0239 06/16/21 0106  WBC 8.9 8.0 10.5 6.7 7.1  HGB 9.3* 10.1* 10.1* 9.8* 9.7*  HCT 27.5* 30.6* 31.2* 29.8* 29.1*  MCV 95.5 96.8 97.5 99.0 99.0  PLT 184 165 196 168 156    Blood Culture    Component Value Date/Time   SDES BLOOD RIGHT HAND 05/01/2021 0949   SPECREQUEST  05/01/2021 0949    BOTTLES  DRAWN AEROBIC AND ANAEROBIC Blood Culture results may not be optimal due to an inadequate volume of blood received in culture bottles   CULT  05/01/2021 0949    NO GROWTH 5 DAYS Performed at [facility] [address], [place], [place] [address]    REPTSTATUS 05/06/2021 FINAL 05/01/2021 0949    Studies/Results: No results found.  Medications:  heparin 650 Units/hr (06/15/21 1409)   promethazine (PHENERGAN) injection (IM or IVPB) Stopped (06/13/21 2334)    (feeding supplement) PROSource Plus  30 mL Oral TID BM   bisacodyl  10 mg Rectal Daily   Chlorhexidine Gluconate Cloth  6 each Topical Q0600   Chlorhexidine Gluconate Cloth  6 each Topical Q0600   darbepoetin (ARANESP) injection - DIALYSIS  60 mcg Intravenous Q Mon-HD   diltiazem   Topical BID   ferric citrate  210 mg Oral TID WC   gabapentin  100 mg Oral BID   hydrocortisone  25 mg Rectal BID   magnesium oxide  200 mg Oral Daily   midodrine  10 mg Oral TID WC   midodrine  10 mg Oral Q  M,W,F-HD   multivitamin  1 tablet Oral QHS   polyethylene glycol  17 g Oral BID   senna-docusate  2 tablet Oral BID   simethicone  80 mg Oral QID   sodium chloride flush  3 mL Intravenous Q12H    Dialysis Orders: MWF at [facility] 4hr, 400/500, EDW 72kg (although leaving 74kg range), 2K/2Ca, TDC (+ maturing AVF), heparin 2000 unit bolus - No ESA or VDRA   Assessment/Plan: Proctitis/ rectal ulceration/ fecal impaction: On aggressive bowel regimen.  Disimpacted by GI w/large amount of stool removed. Having good BM but continued rectal pain.  GI following - FFS on 2/10 rectal ulceration/proctitis, biopsies taken.  Liver/kidney lesions: Elevated LFTs. For MRI to further characterize liver lesions seen on CT Dysphagia: Swallow studies normal.  ESRD:  MWF.  Next HD 2/13 Hypotension/volume: Chronically low BP, on midodrine 10mg  TID. Ordered prn midodrine to be given pre HD. Increasing volume noted on CT. UF as BP allows.  Anemia: Hgb 9.7 - FOBT  negative. Tsat 24%, IV iron ordered. Aranesp 60 q Monday.  Metabolic bone disease: CorrCa and Phos high.  Needs binder  start auryxia 1 AC TID.  CAD/HFrEF - missed outpatient cardiology follow up.   Will need to reschedule after d/c unless need to be seen as inpatient. A-fib: On amiodarone. Eliquis on hold. Heparin per pharmacy.  Nutrition - On regular diet. RD concerned for poor intake. Prot supp for low albumin.  Follow labs.   Lynnda Child PA-C [facility] 06/16/2021,11:08 AM   Pt seen, examined and agree w A/P as above.  Kelly Splinter  MD 06/16/2021, 4:49 PM

## 2021-06-16 NOTE — Progress Notes (Signed)
Per pharmacy heparin drip to be held for 1hour. Infusion on hold as directed

## 2021-06-17 ENCOUNTER — Inpatient Hospital Stay (HOSPITAL_COMMUNITY): Payer: Medicare Other

## 2021-06-17 DIAGNOSIS — F419 Anxiety disorder, unspecified: Secondary | ICD-10-CM

## 2021-06-17 LAB — COMPREHENSIVE METABOLIC PANEL
ALT: 193 U/L — ABNORMAL HIGH (ref 0–44)
AST: 111 U/L — ABNORMAL HIGH (ref 15–41)
Albumin: 3.1 g/dL — ABNORMAL LOW (ref 3.5–5.0)
Alkaline Phosphatase: 164 U/L — ABNORMAL HIGH (ref 38–126)
Anion gap: 16 — ABNORMAL HIGH (ref 5–15)
BUN: 83 mg/dL — ABNORMAL HIGH (ref 8–23)
CO2: 21 mmol/L — ABNORMAL LOW (ref 22–32)
Calcium: 9.1 mg/dL (ref 8.9–10.3)
Chloride: 97 mmol/L — ABNORMAL LOW (ref 98–111)
Creatinine, Ser: 8.67 mg/dL — ABNORMAL HIGH (ref 0.61–1.24)
GFR, Estimated: 6 mL/min — ABNORMAL LOW (ref >60)
Glucose, Bld: 153 mg/dL — ABNORMAL HIGH (ref 70–99)
Potassium: 4.5 mmol/L (ref 3.5–5.1)
Sodium: 134 mmol/L — ABNORMAL LOW (ref 135–145)
Total Bilirubin: 1.3 mg/dL — ABNORMAL HIGH (ref 0.3–1.2)
Total Protein: 6.3 g/dL — ABNORMAL LOW (ref 6.5–8.1)

## 2021-06-17 LAB — CBC
HCT: 29.9 % — ABNORMAL LOW (ref 39.0–52.0)
Hemoglobin: 10 g/dL — ABNORMAL LOW (ref 13.0–17.0)
MCH: 32.7 pg (ref 26.0–34.0)
MCHC: 33.4 g/dL (ref 30.0–36.0)
MCV: 97.7 fL (ref 80.0–100.0)
Platelets: 150 10*3/uL (ref 150–400)
RBC: 3.06 MIL/uL — ABNORMAL LOW (ref 4.22–5.81)
RDW: 19.5 % — ABNORMAL HIGH (ref 11.5–15.5)
WBC: 6.6 10*3/uL (ref 4.0–10.5)
nRBC: 1.8 % — ABNORMAL HIGH (ref 0.0–0.2)

## 2021-06-17 LAB — APTT: aPTT: 64 seconds — ABNORMAL HIGH (ref 24–36)

## 2021-06-17 LAB — SURGICAL PATHOLOGY

## 2021-06-17 LAB — GLUCOSE, CAPILLARY
Glucose-Capillary: 122 mg/dL — ABNORMAL HIGH (ref 70–99)
Glucose-Capillary: 117 mg/dL — ABNORMAL HIGH (ref 70–99)

## 2021-06-17 LAB — HIV ANTIBODY (ROUTINE TESTING W REFLEX): HIV Screen 4th Generation wRfx: NONREACTIVE

## 2021-06-17 LAB — HEPARIN LEVEL (UNFRACTIONATED): Heparin Unfractionated: 0.89 IU/mL — ABNORMAL HIGH (ref 0.30–0.70)

## 2021-06-17 MED ORDER — HEPARIN SODIUM (PORCINE) 1000 UNIT/ML IJ SOLN
INTRAMUSCULAR | Status: AC
  Filled 2021-06-17: qty 4

## 2021-06-17 MED ORDER — LACTATED RINGERS IV BOLUS
250.0000 mL | Freq: Once | INTRAVENOUS | Status: AC
  Administered 2021-06-17: 250 mL via INTRAVENOUS

## 2021-06-17 MED ORDER — LIDOCAINE-PRILOCAINE 2.5-2.5 % EX CREA: 1.0000 "application " | TOPICAL_CREAM | CUTANEOUS | Status: DC | PRN

## 2021-06-17 MED ORDER — ALTEPLASE 2 MG IJ SOLR: 2.0000 mg | Freq: Once | INTRAMUSCULAR | Status: DC | PRN

## 2021-06-17 MED ORDER — HEPARIN SODIUM (PORCINE) 1000 UNIT/ML DIALYSIS: 1000.0000 [IU] | INTRAMUSCULAR | Status: DC | PRN

## 2021-06-17 MED ORDER — GADOBUTROL 1 MMOL/ML IV SOLN
7.0000 mL | Freq: Once | INTRAVENOUS | Status: AC | PRN
  Administered 2021-06-17: 7 mL via INTRAVENOUS

## 2021-06-17 MED ORDER — PENTAFLUOROPROP-TETRAFLUOROETH EX AERO: 1.0000 "application " | INHALATION_SPRAY | CUTANEOUS | Status: DC | PRN

## 2021-06-17 MED ORDER — LIDOCAINE HCL (PF) 1 % IJ SOLN: 5.0000 mL | INTRAMUSCULAR | Status: DC | PRN

## 2021-06-17 MED ORDER — HEPARIN SODIUM (PORCINE) 1000 UNIT/ML DIALYSIS: 20.0000 [IU]/kg | INTRAMUSCULAR | Status: DC | PRN

## 2021-06-17 MED ORDER — SODIUM CHLORIDE 0.9 % IV SOLN: 100.0000 mL | INTRAVENOUS | Status: DC | PRN

## 2021-06-17 MED ORDER — HEPARIN SODIUM (PORCINE) 1000 UNIT/ML IJ SOLN
2000.0000 [IU] | Freq: Once | INTRAMUSCULAR | Status: AC
  Administered 2021-06-17: 2000 [IU] via INTRAVENOUS

## 2021-06-17 NOTE — Progress Notes (Addendum)
Subjective: Patient continues to have problems with rectal pain during bowel movements but otherwise seems to be doing well. He had 2 BMs yesterday and one earlier today.  The MRI done today d revealed benign liver cyst stable since 2019 with no suspicious liver lesions and 2 small indeterminate T2 hypointense renal cortical lesions.  Also noted was cholelithiasis with mild gallbladder sludge and moderate diffuse gallbladder thickening which was considered to be nonspecific without any evidence of gallbladder distention no biliary ductal dilatation was noted and there was no convincing evidence of choledocholithiasis. His LFT's seem to be improving as well.  Objective: Vital signs in last 24 hours: Temp:  [97.5 F (36.4 C)-98.2 F (36.8 C)] 98.2 F (36.8 C) (02/13 1626) Pulse Rate:  [58-80] 67 (02/13 1626) Resp:  [15-28] 16 (02/13 1626) BP: (99-143)/(55-69) 120/69 (02/13 1626) SpO2:  [98 %-100 %] 100 % (02/13 1626) Weight:  [69.3 kg-70.8 kg] 69.3 kg (02/13 1330) Last BM Date: 06/16/21  Intake/Output from previous day: 02/12 0701 - 02/13 0700 In: 500 [P.O.:500] Out: -  Intake/Output this shift: Total I/O In: -  Out: 1500 [Other:1500]  General appearance: alert, cooperative, appears stated age, no distress, and pale Resp: clear to auscultation bilaterally Cardio: regular rate and rhythm, S1, S2 normal, no murmur, click, rub or gallop GI: soft, non-tender; bowel sounds normal; no masses,  no organomegaly  Lab Results: Recent Labs    06/15/21 0239 06/16/21 0106 06/17/21 0738  WBC 6.7 7.1 6.6  HGB 9.8* 9.7* 10.0*  HCT 29.8* 29.1* 29.9*  PLT 168 156 150   BMET Recent Labs    06/15/21 0239 06/16/21 0106 06/17/21 0738  NA 138 137 134*  K 4.0 4.1 4.5  CL 97* 98 97*  CO2 23 24 21*  GLUCOSE 77 107* 153*  BUN 38* 56* 83*  CREATININE 5.14* 6.93* 8.67*  CALCIUM 8.8* 9.2 9.1   LFT Recent Labs    06/17/21 0738  PROT 6.3*  ALBUMIN 3.1*  AST 111*  ALT 193*  ALKPHOS 164*   BILITOT 1.3*   Studies/Results: MR LIVER W WO CONTRAST  Result Date: 06/17/2021 CLINICAL DATA:  Inpatient. Indeterminate left liver lesions on recent CT. EXAM: MRI ABDOMEN WITHOUT AND WITH CONTRAST TECHNIQUE: Multiplanar multisequence MR imaging of the abdomen was performed both before and after the administration of intravenous contrast. CONTRAST:  81mL GADAVIST GADOBUTROL 1 MMOL/ML IV SOLN COMPARISON:  06/12/2021 CT abdomen/pelvis. FINDINGS: Substantially motion degraded scan, particularly on the postcontrast sequences, limiting assessment. Lower chest: Small dependent right pleural effusion with dependent right lung base atelectasis, unchanged from recent CT. Mild cardiomegaly. Hepatobiliary: Normal liver size and configuration. No hepatic steatosis. Numerous benign-appearing liver cysts scattered throughout the liver, several of which are lobulated and demonstrate thin internal septations, for example measuring 1.5 cm in the segment 4A left liver (series 4/image 15) and 1.5 cm in the central left liver (series 4/image 19), stable in size since 08/10/2017 CT abdomen study. No suspicious liver masses. Layering tiny gallstones and layering mild sludge in the gallbladder. Moderate diffuse gallbladder wall thickening. No significant gallbladder distention. No biliary ductal dilatation. Common bile duct diameter 2 mm. No convincing choledocholithiasis on these motion degraded imagesa. No biliary masses, strictures or beading. Pancreas: No pancreatic mass or duct dilation.  No pancreas divisum. Spleen: Normal size. No mass. Adrenals/Urinary Tract: Normal adrenals. No hydronephrosis. Several simple renal cortical cysts scattered throughout both kidneys, largest 1.7 cm in the upper right kidney and 1.9 cm in the interpolar anterior left  kidney. Homogeneous T2 hypointense 1.0 cm medial interpolar right renal cortical lesion (series 4/image 21) and homogeneous T2 hypointense subcentimeter lateral upper left renal  cortical lesion (series 3/image 13), poorly assessed for enhancement on the substantially motion degraded postcontrast sequences. Stomach/Bowel: Normal non-distended stomach. Visualized small and large bowel is normal caliber, with no bowel wall thickening. Vascular/Lymphatic: Atherosclerotic nonaneurysmal abdominal aorta. Patent portal, splenic, hepatic and renal veins. No pathologically enlarged lymph nodes in the abdomen. Other: Trace perihepatic ascites.  No focal fluid collections. Musculoskeletal: No aggressive appearing focal osseous lesions. IMPRESSION: 1. Substantially motion degraded scan, limiting assessment. 2. Benign liver cysts, stable since 2019 CT. No suspicious liver lesions. 3. Two small indeterminate T2 hypointense renal cortical lesions, largest 1.0 cm in the medial interpolar right kidney, for which assessment for enhancement is nondiagnostic due to substantial motion degradation. Follow-up MRI abdomen without and with IV contrast recommended in 6 months. 4. Cholelithiasis and mild gallbladder sludge. Moderate diffuse gallbladder wall thickening, nonspecific, without significant gallbladder distention. No biliary ductal dilatation. No convincing choledocholithiasis on the motion degraded sequences. 5. Small dependent right pleural effusion with dependent right lung base atelectasis, unchanged from recent CT. 6. Trace perihepatic ascites. Electronically Signed   By: Ilona Sorrel M.D.   On: 06/17/2021 09:49    Medications: I have reviewed the patient's current medications.  Assessment/Plan: 1) Anal fissure with proctitis/rectal ulcer on flexible sigmoidoscopy-continue present care. 2) Elevated Liver enzymes with hepatomegaly and hepatic steatosis on CT with benign liver lesions on MRI-LFTs improving; cholelithiasis without evidence of choledocholithiasis-no evidence of  acute cholecystitis. 3) Anemia of chronic disease. 4) Oropharyngeal dysphagia on thin liquids. 5) ESRD-on  hemodialysis. 6) Chronic systolic heart failure. 7) Non-STEMI 04/29/2021. 8) Paroxysmal atrial fibrillation.  LOS: 9 days   Juanita Craver 06/17/2021, 5:15 PM

## 2021-06-17 NOTE — Progress Notes (Signed)
PT Cancellation Note  Patient Details Name: Ruben [name] MRN: [id number] DOB: 10/06/1945   Cancelled Treatment:    Reason Eval/Treat Not Completed: Patient at procedure or test/unavailable  Pt out for HD.  Will f/u in afternoon as able. Abran Richard, PT Acute Rehab Services Pager [phone] [facility] [place] 06/17/2021, 12:53 PM

## 2021-06-17 NOTE — Consult Note (Signed)
Brief Psychiatry Consult Note  Consulted by Francia Greaves, DO for "anxiety and depression not on antidepressant". No mention of SI on chart review. Went to see pt in AM and he was being wheeled to dialysis (pt asleep and did not awaken to light voice). Per family at bedside he sleeps through most of the dialysis day and is often difficult to wake up after dialysis. On review in afternoon, pt is still in dialysis and additionally seems to have received anesthesia for flexible sigmoidoscopy; will defer consult until tomorrow as pt unlikely to be able to participate in psychiatric interview at this time.   - to see tomorrow  Joycelyn Schmid A Tamir Wallman

## 2021-06-17 NOTE — Progress Notes (Signed)
ANTICOAGULATION CONSULT NOTE  Pharmacy Consult for Heparin Indication: atrial fibrillation  Allergies  Allergen Reactions   Other Other (See Comments)    Pollen - sneezing, itching/watery eyes    Patient Measurements: Height: 5\' 11"  (180.3 cm) Weight: 69.3 kg (152 lb 12.5 oz) IBW/kg (Calculated) : 75.3 kg Heparin Dosing Weight: 78 kg  Vital Signs: Temp: 97.7 F (36.5 C) (02/13 1330) Temp Source: Temporal (02/13 1330) BP: 143/63 (02/13 1330) Pulse Rate: 63 (02/13 1330)  Labs: Recent Labs    06/15/21 0239 06/15/21 1206 06/15/21 2046 06/16/21 0106 06/16/21 2114 06/17/21 0738  HGB 9.8*  --   --  9.7*  --  10.0*  HCT 29.8*  --   --  29.1*  --  29.9*  PLT 168  --   --  156  --  150  APTT 71*   < > 101* >200* 36 64*  HEPARINUNFRC >1.10*   < > >1.10* >1.10*  --  0.89*  CREATININE 5.14*  --   --  6.93*  --  8.67*   < > = values in this interval not displayed.     Estimated Creatinine Clearance: 7.2 mL/min (A) (by C-G formula based on SCr of 8.67 mg/dL (H)).  Assessment: 76 yo male with a history of atrial fibrillation and CAD presents with nausea, vomiting, rectal pain, and worsening abdominal pain. PTA the patient is on apixaban, last dose on 2/3 in the evening (per the patient and family members). Pharmacy is consulted to dose heparin.  Of note, the patient had a recent admission in 04/2021 for NSTEMI, cardiogenic shock, acute systolic CHF, mitral regurgitation, and aortic valve insufficiency.  aPTT acceptable at 64 sec and heparin level is starting to trend down.  aPTTs have been fluctuating with small tweaks in heparin rate; therefore, will keep current rate instead of chasing after the lab values.  Goal of Therapy:  Heparin level 0.3-0.7 units/ml aPTT 66-102 seconds Monitor platelets by anticoagulation protocol: Yes   Plan:  Continue heparin gtt at 600 units/hr Daily heparin level, aPTT and CBC F/u transition back to apixaban as appropriate after all procedures  complete  Mikki Ziff D. Mina Marble, PharmD, BCPS, [place] 06/17/2021, 2:45 PM

## 2021-06-17 NOTE — Progress Notes (Signed)
[facility] Progress Note   Subjective:   Patient seen and examined at bedside in dialysis.  Tolerating well so far.  Pain currently well controlled.  Denies CP, SOB, abdominal pain and n/v/d.   Objective Vitals:   06/17/21 0716 06/17/21 0953 06/17/21 1000 06/17/21 1030  BP: 106/61 109/64 107/61 99/60  Pulse: 74 78 74 62  Resp: 16 (!) 28    Temp: 97.9 F (36.6 C) 97.6 F (36.4 C)    TempSrc: Oral Temporal    SpO2: 99% 99%    Weight:  70.8 kg    Height:       Physical Exam General:chronically ill appearing male in NAD Heart:RRR, no mrg Lungs:CTAB, nml WOB on 2L via [place] Abdomen:soft, NTND Extremities:no LE edema Dialysis Access: TDC, LU AVF maturing +b/t   Filed Weights   06/15/21 0500 06/16/21 0500 06/17/21 0953  Weight: 71.2 kg 72.7 kg 70.8 kg    Intake/Output Summary (Last 24 hours) at 06/17/2021 1104 Last data filed at 06/16/2021 1330 Gross per 24 hour  Intake 140 ml  Output --  Net 140 ml    Additional Objective Labs: Basic Metabolic Panel: Recent Labs  Lab 06/12/21 0402 06/13/21 0115 06/14/21 0041 06/15/21 0239 06/16/21 0106 06/17/21 0738  NA 134* 135 134* 138 137 134*  K 4.3 4.5 5.0 4.0 4.1 4.5  CL 95* 96* 95* 97* 98 97*  CO2 22 24 22 23 24  21*  GLUCOSE 98 81 118* 77 107* 153*  BUN 63* 44* 64* 38* 56* 83*  CREATININE 6.81* 5.26* 7.18* 5.14* 6.93* 8.67*  CALCIUM 8.8* 9.1 9.5 8.8* 9.2 9.1  PHOS 6.6* 5.8* 7.7*  --   --   --    Liver Function Tests: Recent Labs  Lab 06/15/21 0239 06/16/21 0106 06/17/21 0738  AST 299* 208* 111*  ALT 301* 253* 193*  ALKPHOS 217* 190* 164*  BILITOT 1.6* 1.2 1.3*  PROT 6.3* 5.8* 6.3*  ALBUMIN 3.2* 3.0* 3.1*    CBC: Recent Labs  Lab 06/13/21 0115 06/14/21 0041 06/15/21 0239 06/16/21 0106 06/17/21 0738  WBC 8.0 10.5 6.7 7.1 6.6  HGB 10.1* 10.1* 9.8* 9.7* 10.0*  HCT 30.6* 31.2* 29.8* 29.1* 29.9*  MCV 96.8 97.5 99.0 99.0 97.7  PLT 165 196 168 156 150    CBG: Recent Labs  Lab  06/16/21 1135 06/16/21 1709 06/16/21 1937 06/17/21 0101 06/17/21 0450  GLUCAP 137* 124* 127* 122* 117*    Studies/Results: MR LIVER W WO CONTRAST  Result Date: 06/17/2021 CLINICAL DATA:  Inpatient. Indeterminate left liver lesions on recent CT. EXAM: MRI ABDOMEN WITHOUT AND WITH CONTRAST TECHNIQUE: Multiplanar multisequence MR imaging of the abdomen was performed both before and after the administration of intravenous contrast. CONTRAST:  5mL GADAVIST GADOBUTROL 1 MMOL/ML IV SOLN COMPARISON:  06/12/2021 CT abdomen/pelvis. FINDINGS: Substantially motion degraded scan, particularly on the postcontrast sequences, limiting assessment. Lower chest: Small dependent right pleural effusion with dependent right lung base atelectasis, unchanged from recent CT. Mild cardiomegaly. Hepatobiliary: Normal liver size and configuration. No hepatic steatosis. Numerous benign-appearing liver cysts scattered throughout the liver, several of which are lobulated and demonstrate thin internal septations, for example measuring 1.5 cm in the segment 4A left liver (series 4/image 15) and 1.5 cm in the central left liver (series 4/image 19), stable in size since 08/10/2017 CT abdomen study. No suspicious liver masses. Layering tiny gallstones and layering mild sludge in the gallbladder. Moderate diffuse gallbladder wall thickening. No significant gallbladder distention. No biliary ductal dilatation. Common  bile duct diameter 2 mm. No convincing choledocholithiasis on these motion degraded imagesa. No biliary masses, strictures or beading. Pancreas: No pancreatic mass or duct dilation.  No pancreas divisum. Spleen: Normal size. No mass. Adrenals/Urinary Tract: Normal adrenals. No hydronephrosis. Several simple renal cortical cysts scattered throughout both kidneys, largest 1.7 cm in the upper right kidney and 1.9 cm in the interpolar anterior left kidney. Homogeneous T2 hypointense 1.0 cm medial interpolar right renal cortical  lesion (series 4/image 21) and homogeneous T2 hypointense subcentimeter lateral upper left renal cortical lesion (series 3/image 13), poorly assessed for enhancement on the substantially motion degraded postcontrast sequences. Stomach/Bowel: Normal non-distended stomach. Visualized small and large bowel is normal caliber, with no bowel wall thickening. Vascular/Lymphatic: Atherosclerotic nonaneurysmal abdominal aorta. Patent portal, splenic, hepatic and renal veins. No pathologically enlarged lymph nodes in the abdomen. Other: Trace perihepatic ascites.  No focal fluid collections. Musculoskeletal: No aggressive appearing focal osseous lesions. IMPRESSION: 1. Substantially motion degraded scan, limiting assessment. 2. Benign liver cysts, stable since 2019 CT. No suspicious liver lesions. 3. Two small indeterminate T2 hypointense renal cortical lesions, largest 1.0 cm in the medial interpolar right kidney, for which assessment for enhancement is nondiagnostic due to substantial motion degradation. Follow-up MRI abdomen without and with IV contrast recommended in 6 months. 4. Cholelithiasis and mild gallbladder sludge. Moderate diffuse gallbladder wall thickening, nonspecific, without significant gallbladder distention. No biliary ductal dilatation. No convincing choledocholithiasis on the motion degraded sequences. 5. Small dependent right pleural effusion with dependent right lung base atelectasis, unchanged from recent CT. 6. Trace perihepatic ascites. Electronically Signed   By: Ilona Sorrel M.D.   On: 06/17/2021 09:49    Medications:  [START ON 06/18/2021] sodium chloride     [START ON 06/18/2021] sodium chloride     heparin 600 Units/hr (06/16/21 2208)   promethazine (PHENERGAN) injection (IM or IVPB) Stopped (06/13/21 2334)    (feeding supplement) PROSource Plus  30 mL Oral TID BM   bisacodyl  10 mg Rectal Daily   Chlorhexidine Gluconate Cloth  6 each Topical Q0600   Chlorhexidine Gluconate Cloth  6  each Topical Q0600   darbepoetin (ARANESP) injection - DIALYSIS  60 mcg Intravenous Q Mon-HD   diltiazem   Topical BID   ferric citrate  210 mg Oral TID WC   gabapentin  100 mg Oral BID   heparin sodium (porcine)       heparin sodium (porcine)  2,000 Units Intravenous Once   hydrocortisone  25 mg Rectal BID   magnesium oxide  200 mg Oral Daily   midodrine  10 mg Oral TID WC   midodrine  10 mg Oral Q M,W,F-HD   multivitamin  1 tablet Oral QHS   polyethylene glycol  17 g Oral BID   senna-docusate  2 tablet Oral BID   sodium chloride flush  3 mL Intravenous Q12H    Dialysis Orders: MWF at [facility] 4hr, 400/500, EDW 72kg (although leaving 74kg range), 2K/2Ca, TDC (+ maturing AVF), heparin 2000 unit bolus - No ESA or VDRA   Assessment/Plan: Proctitis/ rectal ulceration/ fecal impaction: On aggressive bowel regimen.  Disimpacted by GI w/large amount of stool removed. Having good BM but continued rectal pain.  GI following - FFS on 2/10 rectal ulceration/proctitis, biopsies taken.  Liver/kidney lesions: Elevated LFTs. For MRI today to further characterize liver lesions seen on CT Dysphagia: Swallow studies normal.  ESRD:  MWF.  Next HD today Hypotension/volume: Chronically low BP, on midodrine 10mg  TID. Ordered prn midodrine  to be given pre HD. Increasing volume noted on CT. UF as BP allows.  Anemia: Hgb 10.0 - FOBT negative. Tsat 24%, IV iron ordered. Aranesp 60 q Monday.  Metabolic bone disease: CorrCa and Phos high.  Needs binder start auryxia 1 AC TID.  CAD/HFrEF - missed outpatient cardiology follow up.   Will need to reschedule after d/c unless need to be seen as inpatient. A-fib: On amiodarone. Eliquis on hold. Heparin per pharmacy.  Nutrition - On regular diet. RD concerned for poor intake. Prot supp for low albumin.  Follow labs.   Jen Mow, PA-C [facility] 06/17/2021,11:04 AM  LOS: 9 days

## 2021-06-17 NOTE — Anesthesia Postprocedure Evaluation (Signed)
Anesthesia Post Note  Patient: Ruben [name]  Procedure(s) Performed: [place] [facility]     Patient location during evaluation: Endoscopy Anesthesia Type: MAC Level of consciousness: patient cooperative and awake Pain management: pain level controlled Vital Signs Assessment: post-procedure vital signs reviewed and stable Respiratory status: spontaneous breathing, nonlabored ventilation, respiratory function stable and patient connected to nasal cannula oxygen Cardiovascular status: stable and blood pressure returned to baseline Postop Assessment: no apparent nausea or vomiting Anesthetic complications: no   No notable events documented.  Last Vitals:  Vitals:   06/17/21 1000 06/17/21 1030  BP: 107/61 99/60  Pulse: 74 62  Resp:    Temp:    SpO2:      Last Pain:  Vitals:   06/17/21 0953  TempSrc: Temporal  PainSc:                  Darey Hershberger

## 2021-06-17 NOTE — Progress Notes (Signed)
Physical Therapy Treatment Patient Details Name: Ruben [name] MRN: [id number] DOB: 02-21-1946 Today's Date: 06/17/2021   History of Present Illness Pt is 76 yr old M admitted on 06/07/21 with ongoing c/o vomiting/rectal pain. Imaging (+) for bibasilar atelectasis, lesions in liver/kidneys, possible cholecystitis, possible AVN B femoral heads. PMH: arthritis, ESRD on dialysis, ETOH abuse, HTN, MI    PT Comments    Pt making good progress today compared to evaluation.  He did have HD today so somewhat fatigued, but still able to ambulate 180' with min guard and min cues for safety.  Updated d/c plan to home with family and HHPT.    Recommendations for follow up therapy are one component of a multi-disciplinary discharge planning process, led by the attending physician.  Recommendations may be updated based on patient status, additional functional criteria and insurance authorization.  Follow Up Recommendations  Home health PT     Assistance Recommended at Discharge Frequent or constant Supervision/Assistance  Patient can return home with the following A little help with walking and/or transfers;A little help with bathing/dressing/bathroom;Assistance with cooking/housework   Equipment Recommendations  None recommended by PT    Recommendations for Other Services       Precautions / Restrictions Precautions Precautions: Fall     Mobility  Bed Mobility Overal bed mobility: Needs Assistance Bed Mobility: Supine to Sit     Supine to sit: Min guard          Transfers Overall transfer level: Needs assistance Equipment used: Rolling walker (2 wheels) Transfers: Sit to/from Stand Sit to Stand: Min guard           General transfer comment: Min guard for safety    Ambulation/Gait Ambulation/Gait assistance: Min guard Gait Distance (Feet): 180 Feet Assistive device: Rolling walker (2 wheels) Gait Pattern/deviations: Step-through pattern, Decreased stride  length Gait velocity: decreased     General Gait Details: Min guard for safety; cues for head up and RW proxmity   Stairs             Wheelchair Mobility    Modified Rankin (Stroke Patients Only)       Balance Overall balance assessment: Needs assistance Sitting-balance support: Feet supported, No upper extremity supported Sitting balance-[name] Scale: Good     Standing balance support: Bilateral upper extremity supported, During functional activity Standing balance-[name] Scale: Poor Standing balance comment: using RW but stable with RW                            Cognition Arousal/Alertness: Awake/alert Behavior During Therapy: WFL for tasks assessed/performed Overall Cognitive Status: Within Functional Limits for tasks assessed                                          Exercises      General Comments General comments (skin integrity, edema, etc.): Family in room during session - agree plan is to return home      Pertinent Vitals/Pain Pain Assessment Pain Assessment: No/denies pain    Home Living                          Prior Function            PT Goals (current goals can now be found in the care plan section) Progress towards PT goals: Progressing  toward goals    Frequency    Min 3X/week      PT Plan Current plan remains appropriate    Co-evaluation              AM-PAC PT "6 Clicks" Mobility   Outcome Measure  Help needed turning from your back to your side while in a flat bed without using bedrails?: None Help needed moving from lying on your back to sitting on the side of a flat bed without using bedrails?: A Little Help needed moving to and from a bed to a chair (including a wheelchair)?: A Little Help needed standing up from a chair using your arms (e.g., wheelchair or bedside chair)?: A Little Help needed to walk in hospital room?: A Little Help needed climbing 3-5 steps with a railing?  : A Little 6 Click Score: 19    End of Session Equipment Utilized During Treatment: Gait belt Activity Tolerance: Patient limited by fatigue (HD day) Patient left: in bed;with call bell/phone within reach;with bed alarm set;with family/visitor present Nurse Communication: Mobility status PT Visit Diagnosis: Unsteadiness on feet (R26.81);Other abnormalities of gait and mobility (R26.89);Pain     Time: 1551-1610 PT Time Calculation (min) (ACUTE ONLY): 19 min  Charges:  $Gait Training: 8-22 mins                     Abran Richard, PT Acute Rehab Services Pager [phone] [facility] 06/17/2021, 4:19 PM

## 2021-06-17 NOTE — Progress Notes (Signed)
PROGRESS NOTE  Ruben [name] MHD:[id number] DOB: 05-21-1945 DOA: 06/07/2021 PCP: [name], FNP  HPI/Recap of past 24 hours: 76 y.o. male with medical history significant of hypertension, hyperlipidemia, ESRD on HD MWF, CAD, recent hospitalization for non-STEMI and acute systolic CHF with cardiogenic shock requiring ICU stay and inotrope support, A. fib with RVR on Eliquis, prior alcohol abuse, and tobacco abuse who presents with complaints of rectal pain and difficulty swallowing over the last 2 weeks.  His most recent hospital stay was prolonged, he was here for almost a month.  He has declined since, with weight loss, poor p.o. intake, dysphagia as well as increased constipation.  There was concern for acute cholecystitis on admission and general surgery was consulted.  Initial imaging showed some nonspecific liver lesions, on a noncontrast study, and also patient complained of dysphagia for which GI was consulted.  Hospital course complicated by worsening elevated chemistries, and severe proctitis, proctalgia.  Post flexible sigmoidoscopy on 06/14/2021 by Dr. Benson Norway which revealed Mucosal ulceration, diffuse moderate inflammation was found in the rectum and in the recto-sigmoid colon secondary to proctitis. Biopsied. Anal fissure found on perianal exam.  06/17/21: MRI liver coordinated with hemodialysis.  No acute events overnight.  HD today.  His hemoglobin is uptrending.     Assessment/Plan: Active Problems:   HLD (hyperlipidemia)   ESRD on hemodialysis (HCC)   Weight loss   Suspected acute cholecystitis   Rectal pain and constipation   Dysphagia   Paroxysmal atrial fibrillation (HCC)   Chronic systolic CHF (congestive heart failure) (HCC)   ETOH abuse   Normocytic anemia   Transaminitis   CAD (coronary artery disease)   Liver lesions  Proctalgia (rectal pain) and constipation- (present on admission) Seen by GI, status post manual disimpaction.  Continue bowel regimen to  avoid recurrent constipation. Currently on Anusol for rectal pain. Moderate fold thickening in the rectum, seen on CT scan abdomen and pelvis with contrast done on 06/11/2021.  Plan for flexible sigmoidoscopy on 06/14/2021 due to refractory proctalgia. Post flexible sigmoidoscopy on 06/14/2021 by Dr. Benson Norway which revealed Mucosal ulceration, diffuse moderate inflammation was found in the rectum and in the recto-sigmoid colon secondary to proctitis. Biopsied.  Anal fissure found on perianal exam. Continue pain management and bowel regimen Pain improved today.  Post MI depression/anxiety Endorses feeling depressed and anxious since his acute MI 1 month ago. Denies any suicidal ideation Psychiatry consulted to assist with the management.  Resolved hypotensive, on chronic midodrine Continue home midodrine 10 mg 3 times daily Maintain MAP greater than 65 Continue to closely monitor vital signs.  Improving transaminitis/elevated liver chemistries- (present on admission) Liver chemistries are downtrending. Continue to avoid hepatotoxic agents. Liver lesions seen on CT scan Follow MRI liver with and without contrast, ordered by GI, follow results. Management per GI.   Acute on chronic systolic CHF (congestive heart failure) ([place])- (present on admission) -Following NSTEMI in January 2023 patient was noted to have gone into cardiogenic shock and required temporary stay in ICU requiring pressors which were able to be weaned off and he was transition to midodrine.   2D echo done on 05/13/2021 revealed EF 35-40% with moderate mitral regurgitation.  Volume status management hemodialysis.   Continue strict I's and O's and daily weight.   Liver lesions, follow-up MRI liver - CT scan on admission showed low-attenuation lesions measuring about 12 mm diameter in the liver.   Keep on heparin drip in case a biopsy is needed Follow MRI liver  with and without contrast, ordered by GI.   CAD (coronary artery  disease)- (present on admission) -Patient had recent NSTEMI 04/2021 for which he underwent cardiac catheterization noting RCA occlusion with diffuse disease of the LAD and left circumflex.  It was not amendable to PCI and he was not a surgical candidate for CABG.  Home statin and amiodarone held due to elevated liver chemistries. Denies any anginal symptoms.   Paroxysmal atrial fibrillation (HCC) -Patient appears to be in sinus rhythm at this time and rate controlled. Held home Eliquis. Heparin drip per pharmacy for possibility of needed procedure. Amiodarone on hold due to elevated liver chemistries. Rate is currently controlled.   ESRD on hemodialysis (HCC)MWF -He has new end-stage renal disease following his most recent hospital stay.  He was discharged January 2023.  Currently MWF.  Just had an AV fistula placed, it has not matured and currently having dialysis via HD cath. Hemodialysis on 06/12/2021 and 06/15/2021. Next dialysis 06/17/2021.   Chronic normocytic anemia/anemia of chronic disease- (present on admission) -Hemoglobin stable 10.1 from 10.1 No overt bleeding. Iron studies done on 06/10/2021, no iron deficiency.   ETOH abuse -Patient has not not drank alcohol since he was admitted to the hospital in December 2022. Continue to encourage abstinence from alcohol   Dysphagia -Patient complains of food and liquids getting stuck in his throat causing him to vomit it back up.  Denies complaints of coughing while eating.  He has a prior history of alcohol abuse.  Management per gastroenterology.   Suspected acute cholecystitis -Patient presented with complaints of abdominal pain with nausea and vomiting on admission.  CT scan of the abdomen pelvis significant for increased density in the gallbladder concerning for milk of calcium or large gallstones with gallbladder wall thickening.  General surgery has been consulted and evaluated patient, and they do not feel that imaging and physical  exam and symptoms are consistent with acute cholecystitis.  They signed off.  He was initially placed on ceftriaxone but it has been discontinued now.   Weight loss -family reports 20-30 pound weight loss over the last month.  Question if multifactorial in the recent NSTEMI, ESRD, and constipation with poor p.o. intake.  TSH unremarkable.  Nonspecific liver lesions noted on the CT scan are concerning, GI to weigh in   HLD (hyperlipidemia)- (present on admission) -Hold off statin with uptrending liver chemistries   Severe protein calorie malnutrition Liberate diet as recommended by dietitian Oral supplements  Elevated alkaline phosphatase Downtrending. Continue to monitor  Resolved hypervolemic hyponatremia Serum sodium 138 from 134 Electrolytes and volume status addressed with hemodialysis  Physical debility OT evaluation recommended home health OT Continue PT OT with assistance and fall precautions   Code Status: Full Code   Family Communication: Wife at bedside.   Status is: Inpatient   Remains inpatient appropriate because: Severity of illness     Level of care: Telemetry Medical   Consultants:  Gastroenterology General surgery Nephrology Psychiatry   Procedures:  Hemodialysis. Flexible sigmoidoscopy.   Microbiology  none    Objective: Vitals:   06/17/21 0716 06/17/21 0953 06/17/21 1000 06/17/21 1030  BP: 106/61 109/64 107/61 99/60  Pulse: 74 78 74 62  Resp: 16 (!) 28    Temp: 97.9 F (36.6 C) 97.6 F (36.4 C)    TempSrc: Oral Temporal    SpO2: 99% 99%    Weight:  70.8 kg    Height:        Intake/Output Summary (Last 24  hours) at 06/17/2021 1239 Last data filed at 06/16/2021 1330 Gross per 24 hour  Intake 140 ml  Output --  Net 140 ml   Filed Weights   06/15/21 0500 06/16/21 0500 06/17/21 0953  Weight: 71.2 kg 72.7 kg 70.8 kg    Exam:  General: 76 y.o. year-old male frail-appearing no acute distress.   Cardiovascular: Regular rate and  rhythm Respiratory: Clear to auscultation Abdomen: Soft monitor normal bowel sounds present. Musculoskeletal: No lower extremity edema bilaterally Psychiatry: Flat affect Neuro: Nonfocal exam.   Data Reviewed: CBC: Recent Labs  Lab 06/13/21 0115 06/14/21 0041 06/15/21 0239 06/16/21 0106 06/17/21 0738  WBC 8.0 10.5 6.7 7.1 6.6  HGB 10.1* 10.1* 9.8* 9.7* 10.0*  HCT 30.6* 31.2* 29.8* 29.1* 29.9*  MCV 96.8 97.5 99.0 99.0 97.7  PLT 165 196 168 156 528   Basic Metabolic Panel: Recent Labs  Lab 06/11/21 0649 06/12/21 0402 06/13/21 0115 06/14/21 0041 06/15/21 0239 06/16/21 0106 06/17/21 0738  NA 139 134* 135 134* 138 137 134*  K 4.1 4.3 4.5 5.0 4.0 4.1 4.5  CL 97* 95* 96* 95* 97* 98 97*  CO2 27 22 24 22 23 24  21*  GLUCOSE 108* 98 81 118* 77 107* 153*  BUN 44* 63* 44* 64* 38* 56* 83*  CREATININE 5.19* 6.81* 5.26* 7.18* 5.14* 6.93* 8.67*  CALCIUM 8.6* 8.8* 9.1 9.5 8.8* 9.2 9.1  PHOS 5.8* 6.6* 5.8* 7.7*  --   --   --    GFR: Estimated Creatinine Clearance: 7.4 mL/min (A) (by C-G formula based on SCr of 8.67 mg/dL (H)). Liver Function Tests: Recent Labs  Lab 06/13/21 0115 06/14/21 0041 06/15/21 0239 06/16/21 0106 06/17/21 0738  AST 411* 374* 299* 208* 111*  ALT 319* 336* 301* 253* 193*  ALKPHOS 287* 227* 217* 190* 164*  BILITOT 2.0* 1.5* 1.6* 1.2 1.3*  PROT 6.3* 6.4* 6.3* 5.8* 6.3*  ALBUMIN 3.1*   3.0* 3.2*   3.1* 3.2* 3.0* 3.1*   No results for input(s): LIPASE, AMYLASE in the last 168 hours.  No results for input(s): AMMONIA in the last 168 hours. Coagulation Profile: No results for input(s): INR, PROTIME in the last 168 hours. Cardiac Enzymes: No results for input(s): CKTOTAL, CKMB, CKMBINDEX, TROPONINI in the last 168 hours. BNP (last 3 results) No results for input(s): PROBNP in the last 8760 hours. HbA1C: No results for input(s): HGBA1C in the last 72 hours. CBG: Recent Labs  Lab 06/16/21 1135 06/16/21 1709 06/16/21 1937 06/17/21 0101  06/17/21 0450  GLUCAP 137* 124* 127* 122* 117*   Lipid Profile: No results for input(s): CHOL, HDL, LDLCALC, TRIG, CHOLHDL, LDLDIRECT in the last 72 hours. Thyroid Function Tests: No results for input(s): TSH, T4TOTAL, FREET4, T3FREE, THYROIDAB in the last 72 hours. Anemia Panel: No results for input(s): VITAMINB12, FOLATE, FERRITIN, TIBC, IRON, RETICCTPCT in the last 72 hours.  Urine analysis:    Component Value Date/Time   COLORURINE YELLOW 05/14/2021 1842   APPEARANCEUR CLEAR 05/14/2021 1842   LABSPEC >1.030 (H) 05/14/2021 1842   PHURINE 5.5 05/14/2021 1842   GLUCOSEU NEGATIVE 05/14/2021 1842   HGBUR NEGATIVE 05/14/2021 1842   BILIRUBINUR NEGATIVE 05/14/2021 1842   BILIRUBINUR negative 01/23/2020 1028   KETONESUR NEGATIVE 05/14/2021 1842   PROTEINUR NEGATIVE 05/14/2021 1842   UROBILINOGEN 1.0 01/23/2020 1028   NITRITE NEGATIVE 05/14/2021 1842   LEUKOCYTESUR NEGATIVE 05/14/2021 1842   Sepsis Labs: @LABRCNTIP (procalcitonin:4,lacticidven:4)  ) Recent Results (from the past 240 hour(s))  Resp Panel by RT-PCR (Flu A&B,  Covid) Nasopharyngeal Swab     Status: None   Collection Time: 06/07/21  8:40 PM   Specimen: Nasopharyngeal Swab; Nasopharyngeal(NP) swabs in vial transport medium  Result Value Ref Range Status   SARS Coronavirus 2 by RT PCR NEGATIVE NEGATIVE Final    Comment: (NOTE) SARS-CoV-2 target nucleic acids are NOT DETECTED.  The SARS-CoV-2 RNA is generally detectable in upper respiratory specimens during the acute phase of infection. The lowest concentration of SARS-CoV-2 viral copies this assay can detect is 138 copies/mL. A negative result does not preclude SARS-Cov-2 infection and should not be used as the sole basis for treatment or other patient management decisions. A negative result may occur with  improper specimen collection/handling, submission of specimen other than nasopharyngeal swab, presence of viral mutation(s) within the areas targeted by this  assay, and inadequate number of viral copies(<138 copies/mL). A negative result must be combined with clinical observations, patient history, and epidemiological information. The expected result is Negative.  Fact Sheet for Patients:  EntrepreneurPulse.com.au  Fact Sheet for Healthcare Providers:  IncredibleEmployment.be  This test is no t yet approved or cleared by the [place] FDA and  has been authorized for detection and/or diagnosis of SARS-CoV-2 by FDA under an Emergency Use Authorization (EUA). This EUA will remain  in effect (meaning this test can be used) for the duration of the COVID-19 declaration under Section 564(b)(1) of the Act, 21 U.S.C.section 360bbb-3(b)(1), unless the authorization is terminated  or revoked sooner.       Influenza A by PCR NEGATIVE NEGATIVE Final   Influenza B by PCR NEGATIVE NEGATIVE Final    Comment: (NOTE) The Xpert Xpress SARS-CoV-2/FLU/RSV plus assay is intended as an aid in the diagnosis of influenza from Nasopharyngeal swab specimens and should not be used as a sole basis for treatment. Nasal washings and aspirates are unacceptable for Xpert Xpress SARS-CoV-2/FLU/RSV testing.  Fact Sheet for Patients: EntrepreneurPulse.com.au  Fact Sheet for Healthcare Providers: IncredibleEmployment.be  This test is not yet approved or cleared by the [place] FDA and has been authorized for detection and/or diagnosis of SARS-CoV-2 by FDA under an Emergency Use Authorization (EUA). This EUA will remain in effect (meaning this test can be used) for the duration of the COVID-19 declaration under Section 564(b)(1) of the Act, 21 U.S.C. section 360bbb-3(b)(1), unless the authorization is terminated or revoked.  Performed at [facility], [address] [place], [place] [address]       Studies: MR LIVER W WO CONTRAST  Result Date: 06/17/2021 CLINICAL  DATA:  Inpatient. Indeterminate left liver lesions on recent CT. EXAM: MRI ABDOMEN WITHOUT AND WITH CONTRAST TECHNIQUE: Multiplanar multisequence MR imaging of the abdomen was performed both before and after the administration of intravenous contrast. CONTRAST:  52mL GADAVIST GADOBUTROL 1 MMOL/ML IV SOLN COMPARISON:  06/12/2021 CT abdomen/pelvis. FINDINGS: Substantially motion degraded scan, particularly on the postcontrast sequences, limiting assessment. Lower chest: Small dependent right pleural effusion with dependent right lung base atelectasis, unchanged from recent CT. Mild cardiomegaly. Hepatobiliary: Normal liver size and configuration. No hepatic steatosis. Numerous benign-appearing liver cysts scattered throughout the liver, several of which are lobulated and demonstrate thin internal septations, for example measuring 1.5 cm in the segment 4A left liver (series 4/image 15) and 1.5 cm in the central left liver (series 4/image 19), stable in size since 08/10/2017 CT abdomen study. No suspicious liver masses. Layering tiny gallstones and layering mild sludge in the gallbladder. Moderate diffuse gallbladder wall thickening. No significant gallbladder  distention. No biliary ductal dilatation. Common bile duct diameter 2 mm. No convincing choledocholithiasis on these motion degraded imagesa. No biliary masses, strictures or beading. Pancreas: No pancreatic mass or duct dilation.  No pancreas divisum. Spleen: Normal size. No mass. Adrenals/Urinary Tract: Normal adrenals. No hydronephrosis. Several simple renal cortical cysts scattered throughout both kidneys, largest 1.7 cm in the upper right kidney and 1.9 cm in the interpolar anterior left kidney. Homogeneous T2 hypointense 1.0 cm medial interpolar right renal cortical lesion (series 4/image 21) and homogeneous T2 hypointense subcentimeter lateral upper left renal cortical lesion (series 3/image 13), poorly assessed for enhancement on the substantially motion  degraded postcontrast sequences. Stomach/Bowel: Normal non-distended stomach. Visualized small and large bowel is normal caliber, with no bowel wall thickening. Vascular/Lymphatic: Atherosclerotic nonaneurysmal abdominal aorta. Patent portal, splenic, hepatic and renal veins. No pathologically enlarged lymph nodes in the abdomen. Other: Trace perihepatic ascites.  No focal fluid collections. Musculoskeletal: No aggressive appearing focal osseous lesions. IMPRESSION: 1. Substantially motion degraded scan, limiting assessment. 2. Benign liver cysts, stable since 2019 CT. No suspicious liver lesions. 3. Two small indeterminate T2 hypointense renal cortical lesions, largest 1.0 cm in the medial interpolar right kidney, for which assessment for enhancement is nondiagnostic due to substantial motion degradation. Follow-up MRI abdomen without and with IV contrast recommended in 6 months. 4. Cholelithiasis and mild gallbladder sludge. Moderate diffuse gallbladder wall thickening, nonspecific, without significant gallbladder distention. No biliary ductal dilatation. No convincing choledocholithiasis on the motion degraded sequences. 5. Small dependent right pleural effusion with dependent right lung base atelectasis, unchanged from recent CT. 6. Trace perihepatic ascites. Electronically Signed   By: Ilona Sorrel M.D.   On: 06/17/2021 09:49    Scheduled Meds:  (feeding supplement) PROSource Plus  30 mL Oral TID BM   bisacodyl  10 mg Rectal Daily   Chlorhexidine Gluconate Cloth  6 each Topical Q0600   Chlorhexidine Gluconate Cloth  6 each Topical Q0600   darbepoetin (ARANESP) injection - DIALYSIS  60 mcg Intravenous Q Mon-HD   diltiazem   Topical BID   ferric citrate  210 mg Oral TID WC   gabapentin  100 mg Oral BID   heparin sodium (porcine)       heparin sodium (porcine)  2,000 Units Intravenous Once   hydrocortisone  25 mg Rectal BID   magnesium oxide  200 mg Oral Daily   midodrine  10 mg Oral TID WC    midodrine  10 mg Oral Q M,W,F-HD   multivitamin  1 tablet Oral QHS   polyethylene glycol  17 g Oral BID   senna-docusate  2 tablet Oral BID   sodium chloride flush  3 mL Intravenous Q12H    Continuous Infusions:  [START ON 06/18/2021] sodium chloride     [START ON 06/18/2021] sodium chloride     heparin 600 Units/hr (06/16/21 2208)   promethazine (PHENERGAN) injection (IM or IVPB) Stopped (06/13/21 2334)     LOS: 9 days     Kayleen Memos, MD [facility] Pager [phone]  If 7PM-7AM, please contact night-coverage [email or web address] Password [facility] 06/17/2021, 12:39 PM

## 2021-06-17 NOTE — Progress Notes (Signed)
Mobility Specialist Progress Note:   06/17/21 1129  Mobility  Activity Off unit   Pt off unit at dialysis. Will follow-up as time allows.   [facility] Public librarian Phone [phone]

## 2021-06-18 LAB — COMPREHENSIVE METABOLIC PANEL
ALT: 154 U/L — ABNORMAL HIGH (ref 0–44)
AST: 83 U/L — ABNORMAL HIGH (ref 15–41)
Albumin: 3 g/dL — ABNORMAL LOW (ref 3.5–5.0)
Alkaline Phosphatase: 138 U/L — ABNORMAL HIGH (ref 38–126)
Anion gap: 15 (ref 5–15)
BUN: 44 mg/dL — ABNORMAL HIGH (ref 8–23)
CO2: 24 mmol/L (ref 22–32)
Calcium: 8.2 mg/dL — ABNORMAL LOW (ref 8.9–10.3)
Chloride: 96 mmol/L — ABNORMAL LOW (ref 98–111)
Creatinine, Ser: 5.61 mg/dL — ABNORMAL HIGH (ref 0.61–1.24)
GFR, Estimated: 10 mL/min — ABNORMAL LOW (ref >60)
Glucose, Bld: 112 mg/dL — ABNORMAL HIGH (ref 70–99)
Potassium: 4.1 mmol/L (ref 3.5–5.1)
Sodium: 135 mmol/L (ref 135–145)
Total Bilirubin: 1.4 mg/dL — ABNORMAL HIGH (ref 0.3–1.2)
Total Protein: 6 g/dL — ABNORMAL LOW (ref 6.5–8.1)

## 2021-06-18 LAB — APTT: aPTT: 71 seconds — ABNORMAL HIGH (ref 24–36)

## 2021-06-18 LAB — GLUCOSE, CAPILLARY
Glucose-Capillary: 110 mg/dL — ABNORMAL HIGH (ref 70–99)
Glucose-Capillary: 113 mg/dL — ABNORMAL HIGH (ref 70–99)
Glucose-Capillary: 121 mg/dL — ABNORMAL HIGH (ref 70–99)
Glucose-Capillary: 123 mg/dL — ABNORMAL HIGH (ref 70–99)
Glucose-Capillary: 147 mg/dL — ABNORMAL HIGH (ref 70–99)
Glucose-Capillary: 164 mg/dL — ABNORMAL HIGH (ref 70–99)

## 2021-06-18 LAB — CBC
HCT: 30.1 % — ABNORMAL LOW (ref 39.0–52.0)
Hemoglobin: 10.1 g/dL — ABNORMAL LOW (ref 13.0–17.0)
MCH: 33 pg (ref 26.0–34.0)
MCHC: 33.6 g/dL (ref 30.0–36.0)
MCV: 98.4 fL (ref 80.0–100.0)
Platelets: 152 10*3/uL (ref 150–400)
RBC: 3.06 MIL/uL — ABNORMAL LOW (ref 4.22–5.81)
RDW: 19.9 % — ABNORMAL HIGH (ref 11.5–15.5)
WBC: 6.6 10*3/uL (ref 4.0–10.5)
nRBC: 1.1 % — ABNORMAL HIGH (ref 0.0–0.2)

## 2021-06-18 LAB — HEPARIN LEVEL (UNFRACTIONATED): Heparin Unfractionated: 0.68 IU/mL (ref 0.30–0.70)

## 2021-06-18 MED ORDER — HYDROXYZINE HCL 10 MG PO TABS
10.0000 mg | ORAL_TABLET | ORAL | Status: DC
  Administered 2021-06-19 – 2021-06-24 (×3): 10 mg via ORAL
  Filled 2021-06-18 (×5): qty 1

## 2021-06-18 MED ORDER — MIRTAZAPINE 15 MG PO TABS
7.5000 mg | ORAL_TABLET | Freq: Every day | ORAL | Status: DC
  Administered 2021-06-18 – 2021-06-19 (×2): 7.5 mg via ORAL
  Filled 2021-06-18 (×2): qty 1

## 2021-06-18 NOTE — Progress Notes (Signed)
[facility] Progress Note   Subjective:   Patient seen and examined at bedside with daughter present.  Asking if it is ok to eat prior to going to HD.  Agreed he may eat a meal prior but if he starts to get sick at HD after eating may need to only eat a snack.  Continues to have 9/10 rectal pain.  Denies CP, SOB, abdominal pain and edema.    Objective Vitals:   06/17/21 2253 06/18/21 0500 06/18/21 0621 06/18/21 0749  BP: (!) 94/55  (!) 99/57 (!) 98/58  Pulse: 72  72 71  Resp: 15  16 17   Temp: 98.5 F (36.9 C)  (!) 97.5 F (36.4 C) 98.4 F (36.9 C)  TempSrc: Oral  Oral Oral  SpO2: 98%  96% 95%  Weight:  70.8 kg    Height:       Physical Exam General:chronically ill appearing male in NAD Heart:RRR, no mrg Lungs:CTAB, nml WOB on RA Abdomen:soft, NTND Extremities:no LE edema, non pitting LUE edema Dialysis Access: TDC, LU AVF +b/t  Filed Weights   06/17/21 0953 06/17/21 1330 06/18/21 0500  Weight: 70.8 kg 69.3 kg 70.8 kg    Intake/Output Summary (Last 24 hours) at 06/18/2021 0904 Last data filed at 06/18/2021 0451 Gross per 24 hour  Intake 0 ml  Output 1500 ml  Net -1500 ml    Additional Objective Labs: Basic Metabolic Panel: Recent Labs  Lab 06/12/21 0402 06/13/21 0115 06/14/21 0041 06/15/21 0239 06/16/21 0106 06/17/21 0738 06/18/21 0209  NA 134* 135 134*   < > 137 134* 135  K 4.3 4.5 5.0   < > 4.1 4.5 4.1  CL 95* 96* 95*   < > 98 97* 96*  CO2 22 24 22    < > 24 21* 24  GLUCOSE 98 81 118*   < > 107* 153* 112*  BUN 63* 44* 64*   < > 56* 83* 44*  CREATININE 6.81* 5.26* 7.18*   < > 6.93* 8.67* 5.61*  CALCIUM 8.8* 9.1 9.5   < > 9.2 9.1 8.2*  PHOS 6.6* 5.8* 7.7*  --   --   --   --    < > = values in this interval not displayed.   Liver Function Tests: Recent Labs  Lab 06/16/21 0106 06/17/21 0738 06/18/21 0209  AST 208* 111* 83*  ALT 253* 193* 154*  ALKPHOS 190* 164* 138*  BILITOT 1.2 1.3* 1.4*  PROT 5.8* 6.3* 6.0*  ALBUMIN 3.0* 3.1*  3.0*    CBC: Recent Labs  Lab 06/14/21 0041 06/15/21 0239 06/16/21 0106 06/17/21 0738 06/18/21 0209  WBC 10.5 6.7 7.1 6.6 6.6  HGB 10.1* 9.8* 9.7* 10.0* 10.1*  HCT 31.2* 29.8* 29.1* 29.9* 30.1*  MCV 97.5 99.0 99.0 97.7 98.4  PLT 196 168 156 150 152   Blood Culture    Component Value Date/Time   SDES BLOOD RIGHT HAND 05/01/2021 0949   SPECREQUEST  05/01/2021 0949    BOTTLES DRAWN AEROBIC AND ANAEROBIC Blood Culture results may not be optimal due to an inadequate volume of blood received in culture bottles   CULT  05/01/2021 0949    NO GROWTH 5 DAYS Performed at [facility], [place] [address], [place], [place] [address]    REPTSTATUS 05/06/2021 FINAL 05/01/2021 0949    CBG: Recent Labs  Lab 06/16/21 1937 06/17/21 0101 06/17/21 0450 06/18/21 0154 06/18/21 0750  GLUCAP 127* 122* 117* 110* 123*     Studies/Results: MR  LIVER W WO CONTRAST  Result Date: 06/17/2021 CLINICAL DATA:  Inpatient. Indeterminate left liver lesions on recent CT. EXAM: MRI ABDOMEN WITHOUT AND WITH CONTRAST TECHNIQUE: Multiplanar multisequence MR imaging of the abdomen was performed both before and after the administration of intravenous contrast. CONTRAST:  52mL GADAVIST GADOBUTROL 1 MMOL/ML IV SOLN COMPARISON:  06/12/2021 CT abdomen/pelvis. FINDINGS: Substantially motion degraded scan, particularly on the postcontrast sequences, limiting assessment. Lower chest: Small dependent right pleural effusion with dependent right lung base atelectasis, unchanged from recent CT. Mild cardiomegaly. Hepatobiliary: Normal liver size and configuration. No hepatic steatosis. Numerous benign-appearing liver cysts scattered throughout the liver, several of which are lobulated and demonstrate thin internal septations, for example measuring 1.5 cm in the segment 4A left liver (series 4/image 15) and 1.5 cm in the central left liver (series 4/image 19), stable in size since 08/10/2017 CT abdomen study. No suspicious  liver masses. Layering tiny gallstones and layering mild sludge in the gallbladder. Moderate diffuse gallbladder wall thickening. No significant gallbladder distention. No biliary ductal dilatation. Common bile duct diameter 2 mm. No convincing choledocholithiasis on these motion degraded imagesa. No biliary masses, strictures or beading. Pancreas: No pancreatic mass or duct dilation.  No pancreas divisum. Spleen: Normal size. No mass. Adrenals/Urinary Tract: Normal adrenals. No hydronephrosis. Several simple renal cortical cysts scattered throughout both kidneys, largest 1.7 cm in the upper right kidney and 1.9 cm in the interpolar anterior left kidney. Homogeneous T2 hypointense 1.0 cm medial interpolar right renal cortical lesion (series 4/image 21) and homogeneous T2 hypointense subcentimeter lateral upper left renal cortical lesion (series 3/image 13), poorly assessed for enhancement on the substantially motion degraded postcontrast sequences. Stomach/Bowel: Normal non-distended stomach. Visualized small and large bowel is normal caliber, with no bowel wall thickening. Vascular/Lymphatic: Atherosclerotic nonaneurysmal abdominal aorta. Patent portal, splenic, hepatic and renal veins. No pathologically enlarged lymph nodes in the abdomen. Other: Trace perihepatic ascites.  No focal fluid collections. Musculoskeletal: No aggressive appearing focal osseous lesions. IMPRESSION: 1. Substantially motion degraded scan, limiting assessment. 2. Benign liver cysts, stable since 2019 CT. No suspicious liver lesions. 3. Two small indeterminate T2 hypointense renal cortical lesions, largest 1.0 cm in the medial interpolar right kidney, for which assessment for enhancement is nondiagnostic due to substantial motion degradation. Follow-up MRI abdomen without and with IV contrast recommended in 6 months. 4. Cholelithiasis and mild gallbladder sludge. Moderate diffuse gallbladder wall thickening, nonspecific, without significant  gallbladder distention. No biliary ductal dilatation. No convincing choledocholithiasis on the motion degraded sequences. 5. Small dependent right pleural effusion with dependent right lung base atelectasis, unchanged from recent CT. 6. Trace perihepatic ascites. Electronically Signed   By: Ilona Sorrel M.D.   On: 06/17/2021 09:49    Medications:  heparin 600 Units/hr (06/16/21 2208)   promethazine (PHENERGAN) injection (IM or IVPB) Stopped (06/13/21 2334)    (feeding supplement) PROSource Plus  30 mL Oral TID BM   bisacodyl  10 mg Rectal Daily   Chlorhexidine Gluconate Cloth  6 each Topical Q0600   Chlorhexidine Gluconate Cloth  6 each Topical Q0600   darbepoetin (ARANESP) injection - DIALYSIS  60 mcg Intravenous Q Mon-HD   diltiazem   Topical BID   ferric citrate  210 mg Oral TID WC   gabapentin  100 mg Oral BID   hydrocortisone  25 mg Rectal BID   magnesium oxide  200 mg Oral Daily   midodrine  10 mg Oral TID WC   midodrine  10 mg Oral Q M,W,F-HD  multivitamin  1 tablet Oral QHS   polyethylene glycol  17 g Oral BID   senna-docusate  2 tablet Oral BID   sodium chloride flush  3 mL Intravenous Q12H    Dialysis Orders: MWF at [facility] 4hr, 400/500, EDW 72kg (although leaving 74kg range), 2K/2Ca, TDC (+ maturing AVF), heparin 2000 unit bolus - No ESA or VDRA   Assessment/Plan: Proctitis/ rectal ulceration/ fecal impaction: On aggressive bowel regimen.  Disimpacted by GI w/large amount of stool removed. Having good BM but continued rectal pain.  GI following - FFS on 2/10 rectal ulceration/proctitis, biopsies taken.  Liver/kidney lesions: Elevated LFTs. MRI showed benign liver cysts stable since 2019, no suspicious liver lesions, 2 small indeterminate T2 hypointense renal cortical lesions.  Dysphagia: Swallow studies normal.  ESRD:  MWF.  Next HD tomorrow Hypotension/volume: Chronically low BP, on midodrine 10mg  TID. Ordered prn midodrine to be given pre HD. Increasing volume noted on  CT. UF as BP allows.  Anemia: Hgb 10.1 - FOBT negative. Tsat 24%, IV iron ordered. Aranesp 60 q Monday.  Metabolic bone disease: CorrCa and Phos high.  Needs binder start auryxia 1 AC TID.  CAD/HFrEF - missed outpatient cardiology follow up.   Will need to reschedule after d/c unless need to be seen as inpatient. A-fib: On amiodarone. Eliquis on hold. Heparin per pharmacy.  Nutrition - On regular diet. RD concerned for poor intake. Prot supp for low albumin.  Follow labs.   Jen Mow, PA-C [facility] 06/18/2021,9:04 AM  LOS: 10 days

## 2021-06-18 NOTE — Progress Notes (Signed)
Physical Therapy Treatment Patient Details Name: Ruben [name] MRN: [id number] DOB: 05-15-1945 Today's Date: 06/18/2021   History of Present Illness Pt is 75 y.o. male admitted 06/07/21 with ongoing c/o vomiting/rectal pain. Imaging (+) for bibasilar atelectasis, lesions in liver/kidneys, possible cholecystitis, possible AVN B femoral heads. S/p flexible sigmoidoscopy 2/10 revealing mucosal ulceration, proctitis. PMH includes arthritis, ESRD on dialysis, ETOH abuse, HTN, MI.   PT Comments    Pt progressing with mobility. Today's session focused on ambulation for improving strength and activity tolerance; pt moving well with RW and intermittent min guard for balance. Family present and supportive. Pt remains limited by generalized weakness, decreased activity tolerance, and impaired balance strategies/postural reactions. Will continue to follow acutely to address established goals.    Recommendations for follow up therapy are one component of a multi-disciplinary discharge planning process, led by the attending physician.  Recommendations may be updated based on patient status, additional functional criteria and insurance authorization.  Follow Up Recommendations  Home health PT     Assistance Recommended at Discharge Intermittent Supervision/Assistance  Patient can return home with the following A little help with walking and/or transfers;A little help with bathing/dressing/bathroom;Assistance with cooking/housework;Help with stairs or ramp for entrance   Equipment Recommendations  None recommended by PT    Recommendations for Other Services       Precautions / Restrictions Precautions Precautions: Fall Restrictions Weight Bearing Restrictions: No     Mobility  Bed Mobility Overal bed mobility: Modified Independent Bed Mobility: Supine to Sit           General bed mobility comments: increased time    Transfers Overall transfer level: Needs assistance Equipment  used: Rolling walker (2 wheels) Transfers: Sit to/from Stand Sit to Stand: Supervision           General transfer comment: reliant on momentum to power into standing, pt inconsistent with correct hand placement despite cues; supervision for safety standing from EOB and low seat height without armrests    Ambulation/Gait Ambulation/Gait assistance: Min guard, Supervision Gait Distance (Feet): 210 Feet Assistive device: Rolling walker (2 wheels) Gait Pattern/deviations: Step-through pattern, Decreased stride length, Trunk flexed Gait velocity: Decreased     General Gait Details: Slow, steady, fatigued gait with RW and intial min guard for balance, progressing to supervision; 1x seated rest break secondary to fatigue   Stairs             Wheelchair Mobility    Modified Rankin (Stroke Patients Only)       Balance Overall balance assessment: Needs assistance Sitting-balance support: Feet supported, No upper extremity supported Sitting balance-[name] Scale: Good     Standing balance support: Bilateral upper extremity supported, During functional activity, No upper extremity supported Standing balance-[name] Scale: Fair Standing balance comment: able to statically stand without RW support                            Cognition Arousal/Alertness: Awake/alert Behavior During Therapy: Flat affect Overall Cognitive Status: Within Functional Limits for tasks assessed                                 General Comments: at times looking to family to answer certain questions (i.e. oxygen availability at home, but then pt able to state how much he wears PRN)        Exercises      General Comments  General comments (skin integrity, edema, etc.): Wife and son present and supportive. Session performed on 3L O2 ; pt denies dizziness this session      Pertinent Vitals/Pain Pain Assessment Pain Assessment: Faces Faces Pain Scale: Hurts even more Pain  Location: Rectal pain Pain Descriptors / Indicators: Discomfort Pain Intervention(s): Monitored during session, Limited activity within patient's tolerance    Home Living                          Prior Function            PT Goals (current goals can now be found in the care plan section) Progress towards PT goals: Progressing toward goals    Frequency    Min 3X/week      PT Plan Current plan remains appropriate    Co-evaluation              AM-PAC PT "6 Clicks" Mobility   Outcome Measure  Help needed turning from your back to your side while in a flat bed without using bedrails?: None Help needed moving from lying on your back to sitting on the side of a flat bed without using bedrails?: None Help needed moving to and from a bed to a chair (including a wheelchair)?: A Little Help needed standing up from a chair using your arms (e.g., wheelchair or bedside chair)?: A Little Help needed to walk in hospital room?: A Little Help needed climbing 3-5 steps with a railing? : A Little 6 Click Score: 20    End of Session Equipment Utilized During Treatment: Gait belt;Oxygen Activity Tolerance: Patient tolerated treatment well Patient left: Other (comment);with family/visitor present;with call bell/phone within reach (using bathroom) Nurse Communication: Mobility status;Other (comment) (NT notified pt will need assist when done using bathroom, then ready for washup) PT Visit Diagnosis: Unsteadiness on feet (R26.81);Other abnormalities of gait and mobility (R26.89);Pain     Time: 0488-8916 PT Time Calculation (min) (ACUTE ONLY): 26 min  Charges:  $Therapeutic Exercise: 8-22 mins $Self Care/Home Management: [place], PT, DPT Acute Rehabilitation Services  Pager [phone] Office [phone]  Derry Lory 06/18/2021, 12:46 PM

## 2021-06-18 NOTE — Progress Notes (Signed)
Nutrition Follow-up  DOCUMENTATION CODES:  Severe malnutrition in context of chronic illness  INTERVENTION:  Continue regular diet as ordered, encourage PO intake Renavite daily Prosource Plus TID, 100kcal and 15g of protein per packet Magic cup TID with meals, each supplement provides 290 kcal and 9 grams of protein Continue to educate and discuss feeding tube with patient  NUTRITION DIAGNOSIS:  Severe Malnutrition (in the context of chronic illness) related to poor appetite as evidenced by severe fat depletion, severe muscle depletion, percent weight loss (13.4% x 4 months). - ongoing  GOAL:  Patient will meet greater than or equal to 90% of their needs - supplements in place  MONITOR:  PO intake, Diet advancement, Labs  REASON FOR ASSESSMENT:  Consult Assessment of nutrition requirement/status  ASSESSMENT:  76 y.o. male with history of CAD, HTN, HLD, EtOH abuse, CKD3, hx MI, and ESRD on HD who presented to ED with concern for rectal pain, nausea, vomiting, and worsening abdominal pain for several weeks.  2/10 - Flexible sigmoidoscopy   Pt resting in bed at the time of visit with family present at bedside. Lunch tray noted at bedside, untouched. Discussed intake since last assessment with pt, reports that he feels appetite is improving and that his energy is also starting to return to normal. Reports that he is averaging one meal a day currently.  Inquired about nutrition supplements as ensure was replaced with prosource plus since last assessment. Pt reports that the ensure is too thick, he is consuming the procource consistently. Declined a change to boost breeze.   Had a discussion with pt about the effect of HD on the body and the resulting increased needs. Also discussed this in the context of his recent poor intake and severe weight loss. Brought up the possibility of having a feeding tube placed which could be used to provide supplemental nutrition and ensure needs are  met. Pt reports that a feeding tube was mentioned to him during previous admission and he does not want to pursue this. Educated on the different types of tubes available (NG versus G-tube) and how they could support oral intake. Encouraged pt to consider this as an intervention if he feels he is going to be unable to increase his intake to a sustainable level. Pt states that he would like to try to increase his oral intake. Assisted pt with ordering a new lunch meal that sounded more appetizing to him.   Will re-evaluate intake and current interventions later this week. Pt does seem motivated to increase intake and states that nausea has improved.   Admit weight: 78.9 kg Current weight: 70.8kg  Net IO Since Admission: -266.05 mL [06/18/21 1536]  Average Meal Intake: 2/8-2/10: 0% intake x 3 recorded meals 2/11-2/14: 63% intake x 2 recorded meals  Nutritionally Relevant Medications: Scheduled Meds:  PROSource Plus  30 mL Oral TID BM   bisacodyl  10 mg Rectal Daily   ferric citrate  210 mg Oral TID WC   magnesium oxide  200 mg Oral Daily   multivitamin  1 tablet Oral QHS   polyethylene glycol  17 g Oral BID   senna-docusate  2 tablet Oral BID   PRN Meds: ondansetron, promethazine  Labs Reviewed: BUN 44, creatinine 5.61 Phosphorus 7.7 (2/10) SBG ranges from 110-123 mg/dL over the last 24 hours  Lab Results  Component Value Date   ALT 154 (H) 06/18/2021   AST 83 (H) 06/18/2021   ALKPHOS 138 (H) 06/18/2021   BILITOT 1.4 (H)  06/18/2021    NUTRITION - FOCUSED PHYSICAL EXAM: Flowsheet Row Most Recent Value  Orbital Region Moderate depletion  Upper Arm Region Severe depletion  Thoracic and Lumbar Region Severe depletion  Buccal Region Moderate depletion  Temple Region Moderate depletion  Clavicle Bone Region Moderate depletion  Clavicle and Acromion Bone Region Moderate depletion  Scapular Bone Region Moderate depletion  Dorsal Hand Severe depletion  Patellar Region Severe  depletion  Anterior Thigh Region Severe depletion  Posterior Calf Region Severe depletion  Edema (RD Assessment) None  Hair Reviewed  Eyes Reviewed  Skin Reviewed  Nails Reviewed   Diet Order:   Diet Order             Diet regular Room service appropriate? Yes; Fluid consistency: Thin; Fluid restriction: 1200 mL Fluid  Diet effective now                   EDUCATION NEEDS:  Education needs have been addressed  Skin:  Skin Assessment: Reviewed RN Assessment  Last BM:  2/13  Height:  Ht Readings from Last 1 Encounters:  06/07/21 _0  (1.803 m)    Weight:  Wt Readings from Last 1 Encounters:  06/18/21 70.8 kg   Ideal Body Weight:  78.2 kg  BMI:  Body mass index is 21.77 kg/m.  Estimated Nutritional Needs:  Kcal:  2100-2300 kcal/d Protein:  100-110 g/d Fluid:  1L+UOP   Ranell Patrick, RD, LDN Clinical Dietitian RD pager # available in AMION  After hours/weekend pager # available in [facility]

## 2021-06-18 NOTE — Progress Notes (Signed)
ANTICOAGULATION CONSULT NOTE  Pharmacy Consult for Heparin Indication: atrial fibrillation  Allergies  Allergen Reactions   Other Other (See Comments)    Pollen - sneezing, itching/watery eyes    Patient Measurements: Height: 5\' 11"  (180.3 cm) Weight: 70.8 kg (156 lb 1.4 oz) IBW/kg (Calculated) : 75.3 kg Heparin Dosing Weight: 78 kg  Vital Signs: Temp: 98.4 F (36.9 C) (02/14 0749) Temp Source: Oral (02/14 0749) BP: 98/58 (02/14 0749) Pulse Rate: 71 (02/14 0749)  Labs: Recent Labs    06/16/21 0106 06/16/21 2114 06/17/21 0738 06/18/21 0209  HGB 9.7*  --  10.0* 10.1*  HCT 29.1*  --  29.9* 30.1*  PLT 156  --  150 152  APTT >200* 36 64* 71*  HEPARINUNFRC >1.10*  --  0.89* 0.68  CREATININE 6.93*  --  8.67* 5.61*    Estimated Creatinine Clearance: 11.4 mL/min (A) (by C-G formula based on SCr of 5.61 mg/dL (H)).  Assessment: 76 yo male with a history of atrial fibrillation and CAD presents with nausea, vomiting, rectal pain, and worsening abdominal pain. PTA the patient is on apixaban, last dose on 2/3 in the evening (per the patient and family members). Pharmacy is consulted to dose heparin.  Of note, the patient had a recent admission in 04/2021 for NSTEMI, cardiogenic shock, acute systolic CHF, mitral regurgitation, and aortic valve insufficiency.  aPTT is therapeutic at 77 sec. Heparin level is trending down. H/H, plt stable. aPTTs have been fluctuating with small tweaks in heparin rate; therefore, will keep current rate instead of chasing after the lab values.   Goal of Therapy:  Heparin level 0.3-0.7 units/ml aPTT 66-102 seconds Monitor platelets by anticoagulation protocol: Yes   Plan:  Continue heparin gtt at 600 units/hr Daily heparin level, aPTT and CBC F/u transition back to apixaban as appropriate after all procedures complete  Benetta Spar, PharmD, BCPS, [place] Clinical Pharmacist  Please check AMION for all [place] phone numbers After 10:00 PM, call  [place]

## 2021-06-18 NOTE — Consult Note (Signed)
[facility] Consult   Reason for Consult: Anxiety and depression Referring Physician: Dr.  [place] Crane Patient Identification: Ruben [name] MRN:  [id number] Principal Diagnosis: <principal problem not specified> Diagnosis:  Active Problems:   HLD (hyperlipidemia)   ESRD on hemodialysis ([place])   Weight loss   Suspected acute cholecystitis   Rectal pain and constipation   Dysphagia   Paroxysmal atrial fibrillation (HCC)   Chronic systolic CHF (congestive heart failure) (HCC)   ETOH abuse   Normocytic anemia   Transaminitis   CAD (coronary artery disease)   Liver lesions   Total Time spent with patient: 45 minutes HPI:   Assessment Ruben [name] is a 76 y.o. male patient with past medical history of HTN, HLD, ESRD on HD (M, W, F ), CAD, CHF, recent history of non-STEMI, atrial fibrillation on Eliquis, alcohol abuse, tobacco abuse admitted with rectal pain and difficulty in swallowing.  Psych consulted for anxiety and depression by Dr. [place] Crane. Patient reports depression due to multiple stressors including financial stressors, granddaughter going through difficult time, smoking and drinking problem, and multiple medical problems including recent need for dialysis.  He also reports anxiety specially before dialysis.  He screened negative on geriatric depression screening. Ddx major depressive disorder Vs Alcohol induced mood disorder due to depressed mood, poor sleep, poor appetite decreased concentration, fatigue and low energy.  Denies SI, HI, AVH.  Recommend starting Remeron for depression, insomnia and poor appetite and hydroxyzine before dialysis for anxiety. Patient will benefit from starting outpatient psychotherapy and more involvement in church community.   Recommendations Safety Patient is not a danger to himself.  Patient is low risk for self-harm.  Medications -Continue medications per primary. -Start Remeron 7.5 mg nightly for depression, insomnia and  poor appetite. -Start hydroxyzine 10 mg to give before dialysis on dialysis days for anxiety. -Patient will benefit from starting outpatient psychotherapy and more involvement in church community.  Disposition -Per primary Thank you for this psych consult.  Psychiatry will continue to follow.  Subjective: Patient is seen in his room in the presence of his son and wife. Patient states he has been feeling depressed for a while due to multiple stressors.  He states he did something with his credit card, lost his job and now worried about his ability to pay bills.  He states that his granddaughter is going through a lot of problems in her life and he tried to help her as much as he can but still he is always worried about her.  He states his granddaughter does some stupid stuff and and jobs.  He states he is also worried about his smoking and drinking problem. He states he smokes about 1 pack a day and drinks wine(1-2 pints) on most days.  He is also worried about his health and his dialysis needs.  He has been getting dialysis for about a month. He reports feeling depressed for 1-1/2-year, poor sleep, poor appetite, low energy and decreased concentration.  He feels helpless sometimes but does not feel worthless and feels wonderful to be alive.  He is in good spirits most of the time and does not feel that his life is empty.  He does not feel bored and likes talking politics with friends and computers.  Sometimes he is afraid that something bad is going to happen specially with claustrophobia and dialysis.  Patient states he gets anxiety attacks and feels really cold in dialysis.  He feels happy most of the time but prefers  to stay home.  He reports poor memory but does not report any worsening of memory recently.  He does not feel that most people better off him.  He states that his family gives him value.  He worked as a Administrator.  He states he gets vivid dreams usually before and after dialysis and sees  cages, red dots, silver machinery and people moving fast.  He states he used marijuana in 70s but denies any current drug use.  He went to alcohol rehab program long time ago. Currently, he denies active or passive suicidal ideations, homicidal ideations, auditory and visual hallucinations. He is alert and oriented x3.  He thinks that he is in the hospital for heart attack.  He knows current and previous presidents. No cogwheeling or rigidity noted on exam. Collateral from wife and son-Son states that he does not feel financial stressor is a stressor for him.  He states Pts granddaughter had an accident where a pedestrian died and she was charged.  Granddaughter has 4 kids (30, 110, 72, 29-year-old). He tries to help his GD as much as he can.  Patient does have a church community. his son was a Theme park manager 20 years ago.  Discussed starting Remeron for depression, poor sleep and poor appetite and Vistaril for anxiety before dialysis.  Wife, son and Patient agrees with the plan.  Past Psychiatric History: None  Risk to Self: No Risk to Others: No Prior Inpatient Therapy: No  Prior Outpatient Therapy: No  Past Medical History:  Past Medical History:  Diagnosis Date   Arthritis    Chronic kidney disease, stage 3b ([place])    ESRD (end stage renal disease) ([place])    ETOH abuse    History of stress test 09/02/2008   showed inferolateral scar without ischemia   Hx of echocardiogram 09/02/2008   was essentially normal   Hyperlipidemia    Hypertension    Myocardial infarction [facility])    Tobacco abuse     Past Surgical History:  Procedure Laterality Date   AV FISTULA PLACEMENT Left 05/27/2021   Procedure: LEFT ARTERIOVENOUS (AV) GRAFT CREATION;  Surgeon: Broadus John, MD;  Location: [place];  Service: Vascular;  Laterality: Left;  PERIPHERAL NERVE BLOCK   BACK SURGERY     Dr Luiz Ochoa involving L3-L4 discectomy.   BIOPSY  06/14/2021   Procedure: BIOPSY;  Surgeon: Carol Ada, MD;  Location: [facility] ENDOSCOPY;   Service: Endoscopy;;   CARDIOVERSION N/A 05/13/2021   Procedure: CARDIOVERSION;  Surgeon: Larey Dresser, MD;  Location: [facility] ENDOSCOPY;  Service: Cardiovascular;  Laterality: N/A;   FLEXIBLE SIGMOIDOSCOPY N/A 06/14/2021   Procedure: Beryle Quant;  Surgeon: Carol Ada, MD;  Location: [place];  Service: Endoscopy;  Laterality: N/A;   HERNIA REPAIR     IR FLUORO GUIDE CV LINE RIGHT  05/21/2021   IR US GUIDE VASC ACCESS RIGHT  05/21/2021   RIGHT/LEFT HEART CATH AND CORONARY ANGIOGRAPHY N/A 05/09/2021   Procedure: RIGHT/LEFT HEART CATH AND CORONARY ANGIOGRAPHY;  Surgeon: Larey Dresser, MD;  Location: [place] CV LAB;  Service: Cardiovascular;  Laterality: N/A;   SPINE SURGERY     TEE WITHOUT CARDIOVERSION N/A 05/07/2021   Procedure: TRANSESOPHAGEAL ECHOCARDIOGRAM (TEE);  Surgeon: Larey Dresser, MD;  Location: [facility] ENDOSCOPY;  Service: Cardiovascular;  Laterality: N/A;   TEE WITHOUT CARDIOVERSION N/A 05/13/2021   Procedure: TRANSESOPHAGEAL ECHOCARDIOGRAM (TEE);  Surgeon: Larey Dresser, MD;  Location: [facility] ENDOSCOPY;  Service: Cardiovascular;  Laterality: N/A;   Family History:  Family History  Problem Relation Age of Onset   Heart attack Brother    Diabetes Brother    Heart disease Brother    Cancer Father    Diabetes Brother    Heart disease Brother    Family Psychiatric  History: Son had taken antidepressant in the past. Social History:  Social History   Substance and Sexual Activity  Alcohol Use No   Alcohol/week: 0.0 standard drinks     Social History   Substance and Sexual Activity  Drug Use No    Social History   Socioeconomic History   Marital status: Single    Spouse name: Not on file   Number of children: Not on file   Years of education: Not on file   Highest education level: Not on file  Occupational History   Not on file  Tobacco Use   Smoking status: Former    Packs/day: 1.50    Years: 20.00    Pack years: 30.00    Types: Cigarettes    Smokeless tobacco: Never   Tobacco comments:    down to 2 packs per week; 3/21 - down to 1 Pack per week  Vaping Use   Vaping Use: Never used  Substance and Sexual Activity   Alcohol use: No    Alcohol/week: 0.0 standard drinks   Drug use: No   Sexual activity: Not on file  Other Topics Concern   Not on file  Social History Narrative   Not on file   Social Determinants of Health   Financial Resource Strain: Medium Risk   Difficulty of Paying Living Expenses: Somewhat hard  Food Insecurity: No Food Insecurity   Worried About Charity fundraiser in the Last Year: Never true   Ran Out of Food in the Last Year: Never true  Transportation Needs: No Transportation Needs   Lack of Transportation (Medical): No   Lack of Transportation (Non-Medical): No  Physical Activity: Not on file  Stress: Not on file  Social Connections: Not on file   Additional Social History:    Allergies:   Allergies  Allergen Reactions   Other Other (See Comments)    Pollen - sneezing, itching/watery eyes    Labs:  Results for orders placed or performed during the hospital encounter of 06/07/21 (from the past 48 hour(s))  Glucose, capillary     Status: Abnormal   Collection Time: 06/16/21  7:37 PM  Result Value Ref Range   Glucose-Capillary 127 (H) 70 - 99 mg/dL    Comment: Glucose reference range applies only to samples taken after fasting for at least 8 hours.  HIV Antibody (routine testing w rflx)     Status: None   Collection Time: 06/16/21  9:14 PM  Result Value Ref Range   HIV Screen 4th Generation wRfx Non Reactive Non Reactive    Comment: Performed at [facility], [place] [address] [place], [place] [address]  APTT     Status: None   Collection Time: 06/16/21  9:14 PM  Result Value Ref Range   aPTT 36 24 - 36 seconds    Comment: Performed at [place] [address] [place], [place] [address]  Glucose, capillary     Status: Abnormal   Collection Time: 06/17/21  1:01 AM   Result Value Ref Range   Glucose-Capillary 122 (H) 70 - 99 mg/dL    Comment: Glucose reference range applies only to samples taken after fasting for at least 8 hours.  Glucose, capillary     Status: Abnormal   Collection Time: 06/17/21  4:50 AM  Result Value Ref Range   Glucose-Capillary 117 (H) 70 - 99 mg/dL    Comment: Glucose reference range applies only to samples taken after fasting for at least 8 hours.  Comprehensive metabolic panel     Status: Abnormal   Collection Time: 06/17/21  7:38 AM  Result Value Ref Range   Sodium 134 (L) 135 - 145 mmol/L   Potassium 4.5 3.5 - 5.1 mmol/L   Chloride 97 (L) 98 - 111 mmol/L   CO2 21 (L) 22 - 32 mmol/L   Glucose, Bld 153 (H) 70 - 99 mg/dL    Comment: Glucose reference range applies only to samples taken after fasting for at least 8 hours.   BUN 83 (H) 8 - 23 mg/dL   Creatinine, Ser 8.67 (H) 0.61 - 1.24 mg/dL   Calcium 9.1 8.9 - 10.3 mg/dL   Total Protein 6.3 (L) 6.5 - 8.1 g/dL   Albumin 3.1 (L) 3.5 - 5.0 g/dL   AST 111 (H) 15 - 41 U/L   ALT 193 (H) 0 - 44 U/L   Alkaline Phosphatase 164 (H) 38 - 126 U/L   Total Bilirubin 1.3 (H) 0.3 - 1.2 mg/dL   GFR, Estimated 6 (L) >60 mL/min    Comment: (NOTE) Calculated using the CKD-EPI Creatinine Equation (2021)    Anion gap 16 (H) 5 - 15    Comment: Performed at [facility], [place] [address] [place], [place] [address]  Heparin level (unfractionated)     Status: Abnormal   Collection Time: 06/17/21  7:38 AM  Result Value Ref Range   Heparin Unfractionated 0.89 (H) 0.30 - 0.70 IU/mL    Comment: (NOTE) The clinical reportable range upper limit is being lowered to >1.10 to align with the FDA approved guidance for the current laboratory assay.  If heparin results are below expected values, and patient dosage has  been confirmed, suggest follow up testing of antithrombin III levels. Performed at [facility], [place] [address] [place], [place] [address]   APTT     Status: Abnormal    Collection Time: 06/17/21  7:38 AM  Result Value Ref Range   aPTT 64 (H) 24 - 36 seconds    Comment:        IF BASELINE aPTT IS ELEVATED, SUGGEST PATIENT RISK ASSESSMENT BE USED TO DETERMINE APPROPRIATE ANTICOAGULANT THERAPY. Performed at [facility], [place] [address], [place], [place] [address]   CBC     Status: Abnormal   Collection Time: 06/17/21  7:38 AM  Result Value Ref Range   WBC 6.6 4.0 - 10.5 K/uL   RBC 3.06 (L) 4.22 - 5.81 MIL/uL   Hemoglobin 10.0 (L) 13.0 - 17.0 g/dL   HCT 29.9 (L) 39.0 - 52.0 %   MCV 97.7 80.0 - 100.0 fL   MCH 32.7 26.0 - 34.0 pg   MCHC 33.4 30.0 - 36.0 g/dL   RDW 19.5 (H) 11.5 - 15.5 %   Platelets 150 150 - 400 K/uL   nRBC 1.8 (H) 0.0 - 0.2 %    Comment: Performed at [facility], [place] [address], [place], [place] [address]  Glucose, capillary     Status: Abnormal   Collection Time: 06/18/21  1:54 AM  Result Value Ref Range   Glucose-Capillary 110 (H) 70 - 99 mg/dL    Comment: Glucose reference range applies only  to samples taken after fasting for at least 8 hours.  Comprehensive metabolic panel     Status: Abnormal   Collection Time: 06/18/21  2:09 AM  Result Value Ref Range   Sodium 135 135 - 145 mmol/L   Potassium 4.1 3.5 - 5.1 mmol/L   Chloride 96 (L) 98 - 111 mmol/L   CO2 24 22 - 32 mmol/L   Glucose, Bld 112 (H) 70 - 99 mg/dL    Comment: Glucose reference range applies only to samples taken after fasting for at least 8 hours.   BUN 44 (H) 8 - 23 mg/dL   Creatinine, Ser 5.61 (H) 0.61 - 1.24 mg/dL    Comment: DELTA CHECK NOTED   Calcium 8.2 (L) 8.9 - 10.3 mg/dL   Total Protein 6.0 (L) 6.5 - 8.1 g/dL   Albumin 3.0 (L) 3.5 - 5.0 g/dL   AST 83 (H) 15 - 41 U/L   ALT 154 (H) 0 - 44 U/L   Alkaline Phosphatase 138 (H) 38 - 126 U/L   Total Bilirubin 1.4 (H) 0.3 - 1.2 mg/dL   GFR, Estimated 10 (L) >60 mL/min    Comment: (NOTE) Calculated using the CKD-EPI Creatinine Equation (2021)    Anion gap 15 5 - 15    Comment: Performed  at [facility], [place] [address] [place], [place] [address]  APTT     Status: Abnormal   Collection Time: 06/18/21  2:09 AM  Result Value Ref Range   aPTT 71 (H) 24 - 36 seconds    Comment:        IF BASELINE aPTT IS ELEVATED, SUGGEST PATIENT RISK ASSESSMENT BE USED TO DETERMINE APPROPRIATE ANTICOAGULANT THERAPY. Performed at [facility], [place] [address] [place], [place] [address]   Heparin level (unfractionated)     Status: None   Collection Time: 06/18/21  2:09 AM  Result Value Ref Range   Heparin Unfractionated 0.68 0.30 - 0.70 IU/mL    Comment: (NOTE) The clinical reportable range upper limit is being lowered to >1.10 to align with the FDA approved guidance for the current laboratory assay.  If heparin results are below expected values, and patient dosage has  been confirmed, suggest follow up testing of antithrombin III levels. Performed at [facility], [place] [address], [place], [place] [address]   CBC     Status: Abnormal   Collection Time: 06/18/21  2:09 AM  Result Value Ref Range   WBC 6.6 4.0 - 10.5 K/uL   RBC 3.06 (L) 4.22 - 5.81 MIL/uL   Hemoglobin 10.1 (L) 13.0 - 17.0 g/dL   HCT 30.1 (L) 39.0 - 52.0 %   MCV 98.4 80.0 - 100.0 fL   MCH 33.0 26.0 - 34.0 pg   MCHC 33.6 30.0 - 36.0 g/dL   RDW 19.9 (H) 11.5 - 15.5 %   Platelets 152 150 - 400 K/uL   nRBC 1.1 (H) 0.0 - 0.2 %    Comment: Performed at [facility], [place] [address] [place], [place] [address]  Glucose, capillary     Status: Abnormal   Collection Time: 06/18/21  7:50 AM  Result Value Ref Range   Glucose-Capillary 123 (H) 70 - 99 mg/dL    Comment: Glucose reference range applies only to samples taken after fasting for at least 8 hours.  Glucose, capillary     Status: Abnormal   Collection Time: 06/18/21 11:46 AM  Result Value Ref Range   Glucose-Capillary 121 (  H) 70 - 99 mg/dL    Comment: Glucose reference range applies only to samples taken after fasting for at least 8 hours.   Glucose, capillary     Status: Abnormal   Collection Time: 06/18/21  4:28 PM  Result Value Ref Range   Glucose-Capillary 164 (H) 70 - 99 mg/dL    Comment: Glucose reference range applies only to samples taken after fasting for at least 8 hours.    Current Facility-Administered Medications  Medication Dose Route Frequency Provider Last Rate Last Admin   (feeding supplement) PROSource Plus liquid 30 mL  30 mL Oral TID BM Lynnda Child, PA-C   30 mL at 06/18/21 1431   acetaminophen (TYLENOL) tablet 650 mg  650 mg Oral Q6H PRN Norval Morton, MD       Or   acetaminophen (TYLENOL) suppository 650 mg  650 mg Rectal Q6H PRN Fuller Plan A, MD       albuterol (PROVENTIL) (2.5 MG/3ML) 0.083% nebulizer solution 2.5 mg  2.5 mg Nebulization Q6H PRN Fuller Plan A, MD       bisacodyl (DULCOLAX) suppository 10 mg  10 mg Rectal Daily Vladimir Crofts, PA-C   10 mg at 06/18/21 0941   Chlorhexidine Gluconate Cloth 2 % PADS 6 each  6 each Topical Q0600 Loren Racer, PA-C   6 each at 06/18/21 2585   Chlorhexidine Gluconate Cloth 2 % PADS 6 each  6 each Topical Q0600 Penninger, Ria Comment, [place]   6 each at 06/18/21 0612   Darbepoetin Alfa (ARANESP) injection 60 mcg  60 mcg Intravenous Q Mon-HD Loren Racer, PA-C   60 mcg at 06/17/21 1238   Diltiazem 2% gel (15 ml container)   Topical BID Carol Ada, MD   Given at 06/18/21 0947   ferric citrate (AURYXIA) tablet 210 mg  210 mg Oral TID WC Penninger, Lindsay, PA   210 mg at 06/18/21 1431   gabapentin (NEURONTIN) capsule 100 mg  100 mg Oral BID Irene Pap N, DO   100 mg at 06/18/21 0936   heparin ADULT infusion 100 units/mL (25000 units/2107mL)  600 Units/hr Intravenous Continuous Irene Pap N, DO 6 mL/hr at 06/18/21 1014 600 Units/hr at 06/18/21 1014   hydrocortisone (ANUSOL-HC) suppository 25 mg  25 mg Rectal BID Fuller Plan A, MD   25 mg at 06/18/21 0941   HYDROmorphone (DILAUDID) injection 1 mg  1 mg Intravenous Q3H PRN Caren Griffins, MD   1 mg at 06/15/21 1227   hydrOXYzine (ATARAX) tablet 10 mg  10 mg Oral TID PRN Irene Pap N, DO   10 mg at 06/13/21 1030   [START ON 06/19/2021] hydrOXYzine (ATARAX) tablet 10 mg  10 mg Oral Q M,W,F-1800 Saige Busby, MD       magnesium oxide (MAG-OX) tablet 200 mg  200 mg Oral Daily Tamala Julian, Rondell A, MD   200 mg at 06/18/21 0947   midodrine (PROAMATINE) tablet 10 mg  10 mg Oral TID WC Smith, Rondell A, MD   10 mg at 06/18/21 1430   midodrine (PROAMATINE) tablet 10 mg  10 mg Oral Q M,W,F-HD Penninger, Lindsay, PA   10 mg at 06/17/21 1548   mirtazapine (REMERON) tablet 7.5 mg  7.5 mg Oral QHS Armando Reichert, MD       multivitamin (RENA-VIT) tablet 1 tablet  1 tablet Oral QHS Caren Griffins, MD   1 tablet at 06/17/21 2046   ondansetron (ZOFRAN) injection 4 mg  4 mg  Intravenous Q6H PRN Caren Griffins, MD   4 mg at 06/13/21 1918   polyethylene glycol (MIRALAX / GLYCOLAX) packet 17 g  17 g Oral BID Vladimir Crofts, PA-C   17 g at 06/18/21 4431   promethazine (PHENERGAN) 6.25 mg in sodium chloride 0.9 % 50 mL IVPB  6.25 mg Intravenous Q6H PRN Caren Griffins, MD   Stopped at 06/13/21 2334   senna-docusate (Senokot-S) tablet 2 tablet  2 tablet Oral BID Caren Griffins, MD   2 tablet at 06/18/21 5400   sodium chloride flush (NS) 0.9 % injection 3 mL  3 mL Intravenous Q12H Smith, Rondell A, MD   3 mL at 06/18/21 1015   traMADol (ULTRAM) tablet 50 mg  50 mg Oral Q6H PRN Norval Morton, MD   50 mg at 06/17/21 1853    Musculoskeletal: Strength & Muscle Tone:  Not tested Gait & Station:  Deferred Patient leans: N/A            Psychiatric Specialty Exam:  Presentation  General Appearance: Appropriate for Environment; Casual  Eye Contact:Fair  Speech:Clear and Coherent; Normal Rate  Speech Volume:Decreased  Handedness:No data recorded  Mood and Affect  Mood:Anxious; Dysphoric  Affect:Congruent; Constricted   Thought Process  Thought  Processes:Coherent  Descriptions of Associations:Intact  Orientation:Full (Time, Place and Person)  Thought Content:Logical; WDL  History of Schizophrenia/Schizoaffective disorder:No data recorded Duration of Psychotic Symptoms:No data recorded Hallucinations:Hallucinations: None  Ideas of Reference:None  Suicidal Thoughts:Suicidal Thoughts: No  Homicidal Thoughts:Homicidal Thoughts: No   Sensorium  Memory:Immediate Good; Recent Good; Remote Good  Judgment:Fair  Insight:Good   Executive Functions  Concentration:Good  Attention Span:Good  [place] of Knowledge:Good  Language:Good   Psychomotor Activity  Psychomotor Activity:Psychomotor Activity: Normal   Assets  Assets:Communication Skills; Desire for Improvement; Resilience; Social Support   Sleep  Sleep:Sleep: Poor   Physical Exam: Physical Exam Neurological:     General: No focal deficit present.     Mental Status: He is oriented to person, place, and time.   Review of Systems  Psychiatric/Behavioral:  Positive for depression and memory loss. Negative for hallucinations and suicidal ideas. The patient is nervous/anxious and has insomnia.   Blood pressure (!) 105/54, pulse 81, temperature 97.8 F (36.6 C), temperature source Oral, resp. rate 18, height 5\' 11"  (1.803 m), weight 70.8 kg, SpO2 98 %. Body mass index is 21.77 kg/m.   Armando Reichert, MD 06/18/2021 5:19 PM

## 2021-06-18 NOTE — Progress Notes (Signed)
Occupational Therapy Treatment Patient Details Name: Ruben [name] MRN: [id number] DOB: 09-22-1945 Today's Date: 06/18/2021   History of present illness Pt is 76 yr old M admitted on 06/07/21 with ongoing c/o vomiting/rectal pain. Imaging (+) for bibasilar atelectasis, lesions in liver/kidneys, possible cholecystitis, possible AVN B femoral heads. PMH: arthritis, ESRD on dialysis, ETOH abuse, HTN, MI   OT comments  Pt progressing well towards goals this session, able to complete toilet transfer, standing grooming task and LB dressing during session, overall min A for ADLs, min guard for bed mobility and transfers. Pt requiring increased cuing for hand placement on RW for safety. Pt tends to let go with R UE and only push RW with LUE. Pt reporting dizziness with initial sit on EOB, BP 106/56, resolved quickly and RN aware. Pt presenting with impairments listed below, will follow. Recommend HHOT at d/c.   Recommendations for follow up therapy are one component of a multi-disciplinary discharge planning process, led by the attending physician.  Recommendations may be updated based on patient status, additional functional criteria and insurance authorization.    Follow Up Recommendations  Home health OT    Assistance Recommended at Discharge Intermittent Supervision/Assistance  Patient can return home with the following  A little help with walking and/or transfers;Help with stairs or ramp for entrance;Assistance with cooking/housework;A lot of help with bathing/dressing/bathroom   Equipment Recommendations  BSC/3in1    Recommendations for Other Services      Precautions / Restrictions Precautions Precautions: Fall Restrictions Weight Bearing Restrictions: No       Mobility Bed Mobility Overal bed mobility: Needs Assistance Bed Mobility: Supine to Sit     Supine to sit: Min guard Sit to supine: Min guard   General bed mobility comments: increased time needed     Transfers Overall transfer level: Needs assistance Equipment used: Rolling walker (2 wheels) Transfers: Sit to/from Stand Sit to Stand: Min guard           General transfer comment: cues for hand placement     Balance Overall balance assessment: Needs assistance Sitting-balance support: Feet supported, No upper extremity supported Sitting balance-[name] Scale: Good Sitting balance - Comments: reaches outside BOS does not lose balance   Standing balance support: Bilateral upper extremity supported, During functional activity Standing balance-[name] Scale: Fair Standing balance comment: able to statically stand without RW support                           ADL either performed or assessed with clinical judgement   ADL Overall ADL's : Needs assistance/impaired Eating/Feeding: Set up;Sitting   Grooming: Wash/dry hands;Wash/dry face;Oral care;Set up;Standing Grooming Details (indicate cue type and reason): completes oral care standing at sink             Lower Body Dressing: Moderate assistance;Sitting/lateral leans Lower Body Dressing Details (indicate cue type and reason): able to doff socks independently, can not don Toilet Transfer: Minimal assistance;Rolling walker (2 wheels);Ambulation;Regular Glass blower/designer Details (indicate cue type and reason): cues needed to keep RW close to body Toileting- Clothing Manipulation and Hygiene: Supervision/safety;Sit to/from stand Toileting - Clothing Manipulation Details (indicate cue type and reason): moves clothing prior to sitting on commode     Functional mobility during ADLs: Min guard;Rolling walker (2 wheels)      Extremity/Trunk Assessment Upper Extremity Assessment Upper Extremity Assessment: Generalized weakness   Lower Extremity Assessment Lower Extremity Assessment: Defer to PT evaluation  Vision   Vision Assessment?: No apparent visual deficits   Perception Perception Perception:  Not tested   Praxis Praxis Praxis: Not tested    Cognition Arousal/Alertness: Awake/alert Behavior During Therapy: WFL for tasks assessed/performed Overall Cognitive Status: Within Functional Limits for tasks assessed                                          Exercises      Shoulder Instructions       General Comments family in room during session, VSS on 3L O2 throughout session, pt reporting dizzines sitting EOB BP 106/56 sitting EOB    Pertinent Vitals/ Pain       Pain Assessment Pain Assessment: No/denies pain Pain Score: 5  Faces Pain Scale: Hurts even more Pain Location: Rectal pain Pain Descriptors / Indicators: Tender, Discomfort, Burning Pain Intervention(s): Limited activity within patient's tolerance, Monitored during session, Patient requesting pain meds-RN notified, Repositioned  Home Living                                          Prior Functioning/Environment              Frequency  Min 2X/week        Progress Toward Goals  OT Goals(current goals can now be found in the care plan section)  Progress towards OT goals: Progressing toward goals  Acute Rehab OT Goals Patient Stated Goal: none stated OT Goal Formulation: With patient Time For Goal Achievement: 06/29/21 Potential to Achieve Goals: Good ADL Goals Pt Will Perform Upper Body Dressing: with min assist;sitting Pt Will Perform Lower Body Dressing: with mod assist;with min assist;sit to/from stand Pt Will Transfer to Toilet: bedside commode;squat pivot transfer;with supervision  Plan Discharge plan remains appropriate;Frequency remains appropriate    Co-evaluation                 AM-PAC OT "6 Clicks" Daily Activity     Outcome Measure   Help from another person eating meals?: None Help from another person taking care of personal grooming?: A Little Help from another person toileting, which includes using toliet, bedpan, or urinal?: A  Little Help from another person bathing (including washing, rinsing, drying)?: A Little Help from another person to put on and taking off regular upper body clothing?: A Little Help from another person to put on and taking off regular lower body clothing?: A Lot 6 Click Score: 18    End of Session Equipment Utilized During Treatment: Gait belt;Rolling walker (2 wheels);Oxygen  OT Visit Diagnosis: Unsteadiness on feet (R26.81);Other abnormalities of gait and mobility (R26.89);Muscle weakness (generalized) (M62.81);Pain   Activity Tolerance Patient tolerated treatment well   Patient Left in bed;with call bell/phone within reach;with bed alarm set;with family/visitor present;with nursing/sitter in room   Nurse Communication Mobility status        Time: 2563-8937 OT Time Calculation (min): 35 min  Charges: OT General Charges $OT Visit: 1 Visit OT Treatments $Self Care/Home Management : 23-37 mins  Lynnda Child, OTD, OTR/L Acute Rehab [phone] - [place] 06/18/2021, 10:03 AM

## 2021-06-18 NOTE — Progress Notes (Signed)
PROGRESS NOTE  Ruben [name] VQQ:[id number] DOB: [birth year] DOA: 06/07/2021 PCP: [name], FNP  HPI/Recap of past 24 hours: 76 y.o. male with medical history significant of hypertension, hyperlipidemia, ESRD on HD MWF, CAD, recent hospitalization for non-STEMI and acute systolic CHF with cardiogenic shock requiring ICU stay and inotrope support, A. fib with RVR on Eliquis, prior alcohol abuse, and tobacco abuse who presents with complaints of rectal pain and difficulty swallowing over the last 2 weeks.  His most recent hospital stay was prolonged, he was here for almost a month.  He has declined since, with weight loss, poor p.o. intake, dysphagia as well as increased constipation.  There was concern for acute cholecystitis on admission and general surgery was consulted.  Initial imaging showed some nonspecific liver lesions, on a noncontrast study, and also patient complained of dysphagia for which GI was consulted.  Hospital course complicated by worsening elevated chemistries, and severe proctitis, proctalgia for which GI was consulted.  Post flexible sigmoidoscopy on 06/14/2021 by Dr. Benson Norway which revealed Mucosal ulceration, diffuse moderate inflammation was found in the rectum and in the recto-sigmoid colon secondary to proctitis. Biopsied. Anal fissure found on perianal exam.  Also complicated by flat affect, patient endorses new depression and anxiety after his acute MI 1 month ago, psychiatry was consulted.  06/18/21: Patient was seen at his bedside.  His daughter was present in the room.  There were no acute events overnight.  He reports this rectal pain was 8-9 out of 10, improved from prior.     Assessment/Plan: Active Problems:   HLD (hyperlipidemia)   ESRD on hemodialysis (HCC)   Weight loss   Suspected acute cholecystitis   Rectal pain and constipation   Dysphagia   Paroxysmal atrial fibrillation (HCC)   Chronic systolic CHF (congestive heart failure) (HCC)   ETOH  abuse   Normocytic anemia   Transaminitis   CAD (coronary artery disease)   Liver lesions  Proctalgia (rectal pain) and constipation- (present on admission) Seen by GI, status post manual disimpaction.  Continue bowel regimen to avoid recurrent constipation. Currently on Anusol for rectal pain. Moderate fold thickening in the rectum, seen on CT scan abdomen and pelvis with contrast done on 06/11/2021.  Post flexible sigmoidoscopy on 06/14/2021 due to refractory proctalgia by Dr. Benson Norway which revealed Mucosal ulceration, diffuse moderate inflammation was found in the rectum and in the recto-sigmoid colon secondary to proctitis. Biopsied.  Anal fissure found on perianal exam. Continue pain management and bowel regimen Pain improved today.  Post MI depression/anxiety Endorses feeling depressed and anxious since his acute MI 1 month ago. Denies any suicidal ideation Psychiatry consulted to assist with the management.  Resolved hypotensive, on chronic midodrine Continue home midodrine 10 mg 3 times daily Maintain MAP greater than 65 Continue to closely monitor vital signs.  Improving transaminitis/elevated liver chemistries- (present on admission)/liver lesions. Liver chemistries are downtrending. Continue to avoid hepatotoxic agents. Liver lesions seen on CT scan, MRI liver with and without contrast, revealed benign findings.  Please see complete report from radiology.   Acute on chronic systolic CHF (congestive heart failure) ([place])- (present on admission) -Following NSTEMI in January 2023 patient was noted to have gone into cardiogenic shock and required temporary stay in ICU requiring pressors which were able to be weaned off and he was transition to midodrine.   2D echo done on 05/13/2021 revealed EF 35-40% with moderate mitral regurgitation.  Volume status management via hemodialysis.   Continue strict I's and O's  and daily weight.   Liver lesions, post MRI liver on 06/17/2021   CAD  (coronary artery disease)- (present on admission) -Patient had recent NSTEMI 04/2021 for which he underwent cardiac catheterization noting RCA occlusion with diffuse disease of the LAD and left circumflex.  It was not amendable to PCI and he was not a surgical candidate for CABG.  Home statin and amiodarone held due to elevated liver chemistries. Denies any anginal symptoms. Continue to monitor on telemetry.   Paroxysmal atrial fibrillation (HCC) -Patient appears to be in sinus rhythm at this time and rate controlled. Held home Eliquis. Heparin drip per pharmacy for possibility of needed procedure. Amiodarone on hold due to elevated liver chemistries. Rate is currently controlled.   ESRD on hemodialysis (HCC)MWF -He has new end-stage renal disease following his most recent hospital stay.  He was discharged January 2023.  Currently MWF.  Just had an AV fistula placed, it has not matured and currently having dialysis via HD cath. Appreciate nephrology's assistance.  Next hemodialysis 06/19/2021.   Chronic normocytic anemia/anemia of chronic disease- (present on admission) -Hemoglobin stable 10.1 from 10.1 No overt bleeding. Iron studies done on 06/10/2021, no iron deficiency.   ETOH abuse -Patient has not not drank alcohol since he was admitted to the hospital in December 2022. Continue to encourage abstinence from alcohol   Resolved dysphagia -Patient complains of food and liquids getting stuck in his throat causing him to vomit it back up.  Denies complaints of coughing while eating.  He has a prior history of alcohol abuse.  Management per gastroenterology. Currently on a regular consistency diet.  Diet has been liberalized in the setting of protein calorie malnutrition, as recommended by dietitian.   Ruled out acute cholecystitis -Patient presented with complaints of abdominal pain with nausea and vomiting on admission.  CT scan of the abdomen pelvis significant for increased density in  the gallbladder concerning for milk of calcium or large gallstones with gallbladder wall thickening.  General surgery has been consulted and evaluated patient, and they do not feel that imaging and physical exam and symptoms are consistent with acute cholecystitis.  They signed off.  He was initially placed on ceftriaxone but it has been discontinued now.   Unintentional weight loss -family reports 20-30 pound weight loss over the last month.  Question if multifactorial in the recent NSTEMI, ESRD, and constipation with poor p.o. intake.  TSH unremarkable.  Dietitian assisting. Continue to encourage increase in oral protein calorie intake. His diet has been liberalized, nephrology was made aware by dietitian.   HLD (hyperlipidemia)- (present on admission) -Hold off statin for now until liver chemistries have normalized.   Severe protein calorie malnutrition Liberate diet as recommended by dietitian Continue oral supplements  Elevated alkaline phosphatase Levels are down trending. Continue to monitor  Resolved hypervolemic hyponatremia Serum sodium 135. Electrolytes and volume status addressed with hemodialysis Next hemodialysis 06/19/2021. Currently euvolemic on exam.  Physical debility OT evaluation recommended home health OT Continue PT OT with assistance and fall precautions   Code Status: Full Code   Family Communication: Updated his daughter at bedside on 06/18/2021.   Status is: Inpatient   Remains inpatient appropriate because: Severity of illness     Level of care: Telemetry Medical   Consultants:  Gastroenterology General surgery Nephrology Psychiatry   Procedures:  Hemodialysis. Flexible sigmoidoscopy.   Microbiology  none    Objective: Vitals:   06/18/21 0500 06/18/21 0621 06/18/21 0749 06/18/21 0900  BP:  (!) 99/57 Marland Kitchen)  98/58 (!) 106/56  Pulse:  72 71 76  Resp:  16 17   Temp:  (!) 97.5 F (36.4 C) 98.4 F (36.9 C)   TempSrc:  Oral Oral   SpO2:   96% 95%   Weight: 70.8 kg     Height:        Intake/Output Summary (Last 24 hours) at 06/18/2021 1114 Last data filed at 06/18/2021 0451 Gross per 24 hour  Intake 0 ml  Output 1500 ml  Net -1500 ml   Filed Weights   06/17/21 0953 06/17/21 1330 06/18/21 0500  Weight: 70.8 kg 69.3 kg 70.8 kg    Exam:  General: 76 y.o. year-old male frail-appearing in no acute distress.  He is alert and oriented x3. Cardiovascular: Regular rate and rhythm no rubs or gallops.  Respiratory: Clear to auscultation no wheezes or rales. Abdomen: Soft nontender bowel sounds present. Musculoskeletal: No lower extremity edema bilaterally. Psychiatry: Flat affect. Neuro: Nonfocal exam.   Data Reviewed: CBC: Recent Labs  Lab 06/14/21 0041 06/15/21 0239 06/16/21 0106 06/17/21 0738 06/18/21 0209  WBC 10.5 6.7 7.1 6.6 6.6  HGB 10.1* 9.8* 9.7* 10.0* 10.1*  HCT 31.2* 29.8* 29.1* 29.9* 30.1*  MCV 97.5 99.0 99.0 97.7 98.4  PLT 196 168 156 150 500   Basic Metabolic Panel: Recent Labs  Lab 06/12/21 0402 06/13/21 0115 06/14/21 0041 06/15/21 0239 06/16/21 0106 06/17/21 0738 06/18/21 0209  NA 134* 135 134* 138 137 134* 135  K 4.3 4.5 5.0 4.0 4.1 4.5 4.1  CL 95* 96* 95* 97* 98 97* 96*  CO2 22 24 22 23 24  21* 24  GLUCOSE 98 81 118* 77 107* 153* 112*  BUN 63* 44* 64* 38* 56* 83* 44*  CREATININE 6.81* 5.26* 7.18* 5.14* 6.93* 8.67* 5.61*  CALCIUM 8.8* 9.1 9.5 8.8* 9.2 9.1 8.2*  PHOS 6.6* 5.8* 7.7*  --   --   --   --    GFR: Estimated Creatinine Clearance: 11.4 mL/min (A) (by C-G formula based on SCr of 5.61 mg/dL (H)). Liver Function Tests: Recent Labs  Lab 06/14/21 0041 06/15/21 0239 06/16/21 0106 06/17/21 0738 06/18/21 0209  AST 374* 299* 208* 111* 83*  ALT 336* 301* 253* 193* 154*  ALKPHOS 227* 217* 190* 164* 138*  BILITOT 1.5* 1.6* 1.2 1.3* 1.4*  PROT 6.4* 6.3* 5.8* 6.3* 6.0*  ALBUMIN 3.2*   3.1* 3.2* 3.0* 3.1* 3.0*   No results for input(s): LIPASE, AMYLASE in the last 168  hours.  No results for input(s): AMMONIA in the last 168 hours. Coagulation Profile: No results for input(s): INR, PROTIME in the last 168 hours. Cardiac Enzymes: No results for input(s): CKTOTAL, CKMB, CKMBINDEX, TROPONINI in the last 168 hours. BNP (last 3 results) No results for input(s): PROBNP in the last 8760 hours. HbA1C: No results for input(s): HGBA1C in the last 72 hours. CBG: Recent Labs  Lab 06/16/21 1937 06/17/21 0101 06/17/21 0450 06/18/21 0154 06/18/21 0750  GLUCAP 127* 122* 117* 110* 123*   Lipid Profile: No results for input(s): CHOL, HDL, LDLCALC, TRIG, CHOLHDL, LDLDIRECT in the last 72 hours. Thyroid Function Tests: No results for input(s): TSH, T4TOTAL, FREET4, T3FREE, THYROIDAB in the last 72 hours. Anemia Panel: No results for input(s): VITAMINB12, FOLATE, FERRITIN, TIBC, IRON, RETICCTPCT in the last 72 hours.  Urine analysis:    Component Value Date/Time   COLORURINE YELLOW 05/14/2021 1842   APPEARANCEUR CLEAR 05/14/2021 1842   LABSPEC >1.030 (H) 05/14/2021 1842   PHURINE 5.5 05/14/2021 1842  GLUCOSEU NEGATIVE 05/14/2021 1842   HGBUR NEGATIVE 05/14/2021 1842   BILIRUBINUR NEGATIVE 05/14/2021 1842   BILIRUBINUR negative 01/23/2020 1028   KETONESUR NEGATIVE 05/14/2021 1842   PROTEINUR NEGATIVE 05/14/2021 1842   UROBILINOGEN 1.0 01/23/2020 1028   NITRITE NEGATIVE 05/14/2021 1842   LEUKOCYTESUR NEGATIVE 05/14/2021 1842   Sepsis Labs: @LABRCNTIP (procalcitonin:4,lacticidven:4)  ) No results found for this or any previous visit (from the past 240 hour(s)).     Studies: No results found.  Scheduled Meds:  (feeding supplement) PROSource Plus  30 mL Oral TID BM   bisacodyl  10 mg Rectal Daily   Chlorhexidine Gluconate Cloth  6 each Topical Q0600   Chlorhexidine Gluconate Cloth  6 each Topical Q0600   darbepoetin (ARANESP) injection - DIALYSIS  60 mcg Intravenous Q Mon-HD   diltiazem   Topical BID   ferric citrate  210 mg Oral TID WC    gabapentin  100 mg Oral BID   hydrocortisone  25 mg Rectal BID   magnesium oxide  200 mg Oral Daily   midodrine  10 mg Oral TID WC   midodrine  10 mg Oral Q M,W,F-HD   multivitamin  1 tablet Oral QHS   polyethylene glycol  17 g Oral BID   senna-docusate  2 tablet Oral BID   sodium chloride flush  3 mL Intravenous Q12H    Continuous Infusions:  heparin 600 Units/hr (06/18/21 1014)   promethazine (PHENERGAN) injection (IM or IVPB) Stopped (06/13/21 2334)     LOS: 10 days     Kayleen Memos, MD [facility] Pager [phone]  If 7PM-7AM, please contact night-coverage [email or web address] Password [facility] 06/18/2021, 11:14 AM

## 2021-06-19 DIAGNOSIS — F419 Anxiety disorder, unspecified: Secondary | ICD-10-CM

## 2021-06-19 LAB — RENAL FUNCTION PANEL
Albumin: 3 g/dL — ABNORMAL LOW (ref 3.5–5.0)
Anion gap: 14 (ref 5–15)
BUN: 66 mg/dL — ABNORMAL HIGH (ref 8–23)
CO2: 24 mmol/L (ref 22–32)
Calcium: 8.8 mg/dL — ABNORMAL LOW (ref 8.9–10.3)
Chloride: 95 mmol/L — ABNORMAL LOW (ref 98–111)
Creatinine, Ser: 7.25 mg/dL — ABNORMAL HIGH (ref 0.61–1.24)
GFR, Estimated: 7 mL/min — ABNORMAL LOW (ref >60)
Glucose, Bld: 107 mg/dL — ABNORMAL HIGH (ref 70–99)
Phosphorus: 6.5 mg/dL — ABNORMAL HIGH (ref 2.5–4.6)
Potassium: 4 mmol/L (ref 3.5–5.1)
Sodium: 133 mmol/L — ABNORMAL LOW (ref 135–145)

## 2021-06-19 LAB — GLUCOSE, CAPILLARY
Glucose-Capillary: 94 mg/dL (ref 70–99)
Glucose-Capillary: 69 mg/dL — ABNORMAL LOW (ref 70–99)
Glucose-Capillary: 107 mg/dL — ABNORMAL HIGH (ref 70–99)

## 2021-06-19 LAB — CBC
HCT: 30.9 % — ABNORMAL LOW (ref 39.0–52.0)
Hemoglobin: 9.9 g/dL — ABNORMAL LOW (ref 13.0–17.0)
MCH: 31.6 pg (ref 26.0–34.0)
MCHC: 32 g/dL (ref 30.0–36.0)
MCV: 98.7 fL (ref 80.0–100.0)
Platelets: 142 10*3/uL — ABNORMAL LOW (ref 150–400)
RBC: 3.13 MIL/uL — ABNORMAL LOW (ref 4.22–5.81)
RDW: 19.9 % — ABNORMAL HIGH (ref 11.5–15.5)
WBC: 7.3 10*3/uL (ref 4.0–10.5)
nRBC: 0.4 % — ABNORMAL HIGH (ref 0.0–0.2)

## 2021-06-19 LAB — HEPARIN LEVEL (UNFRACTIONATED): Heparin Unfractionated: 0.56 IU/mL (ref 0.30–0.70)

## 2021-06-19 LAB — APTT: aPTT: 68 seconds — ABNORMAL HIGH (ref 24–36)

## 2021-06-19 MED ORDER — HEPARIN SODIUM (PORCINE) 1000 UNIT/ML IJ SOLN
INTRAMUSCULAR | Status: AC
  Administered 2021-06-19: 3800 [IU]
  Filled 2021-06-19: qty 4

## 2021-06-19 MED ORDER — APIXABAN 5 MG PO TABS
5.0000 mg | ORAL_TABLET | Freq: Two times a day (BID) | ORAL | Status: DC
  Administered 2021-06-19 – 2021-06-27 (×15): 5 mg via ORAL
  Filled 2021-06-19 (×16): qty 1

## 2021-06-19 MED ORDER — OXYCODONE HCL 5 MG PO TABS
5.0000 mg | ORAL_TABLET | Freq: Four times a day (QID) | ORAL | Status: DC | PRN
  Administered 2021-06-20 – 2021-06-22 (×5): 5 mg via ORAL
  Filled 2021-06-19 (×5): qty 1

## 2021-06-19 MED ORDER — FERRIC CITRATE 1 GM 210 MG(FE) PO TABS
420.0000 mg | ORAL_TABLET | Freq: Three times a day (TID) | ORAL | Status: DC
  Administered 2021-06-19 – 2021-06-27 (×20): 420 mg via ORAL
  Filled 2021-06-19 (×25): qty 2

## 2021-06-19 MED ORDER — HEPARIN SODIUM (PORCINE) 1000 UNIT/ML IJ SOLN
3800.0000 [IU] | INTRAMUSCULAR | Status: DC | PRN
  Administered 2021-06-21 – 2021-06-26 (×2): 3800 [IU]
  Filled 2021-06-19: qty 4
  Filled 2021-06-19 (×3): qty 3.8
  Filled 2021-06-19: qty 4

## 2021-06-19 NOTE — Progress Notes (Signed)
PROGRESS NOTE  Ruben [name] NTZ:[id number] DOB: 1945/05/06 DOA: 06/07/2021 PCP: [name], FNP   LOS: 11 days   Brief Narrative / Interim history:  76 y.o. male with medical history significant of hypertension, hyperlipidemia, ESRD on HD, CAD, recent hospitalization for non-STEMI and acute systolic CHF with cardiogenic shock requiring ICU stay and inotrope support, A. fib with RVR, prior alcohol abuse, and tobacco abuse who presents with complaints of rectal pain and difficulty swallowing over the last 2 weeks.  His most recent hospital stay was prolonged, he was here for almost a month.  He has declined since with weight loss, poor p.o. intake, dysphagia as well as increased constipation.  There was concern for acute cholecystitis on admission and general surgery was consulted.  Initial imaging showed some nonspecific liver lesions, on a noncontrast study, and also patient complained of dysphagia for which GI was consulted.  Subjective / 24h Interval events: Complains of rectal pain  Assessment and Plan: Rectal pain and constipation- (present on admission) -patient reports complaints of constipation and rectal pain for which she has been unable to have adequate bowel movement despite taking stool softeners, sitz bath's, use of Preparation H, and suppositories. CT scan of the abdomen pelvis did not note any signs of fecal impaction or bowel obstruction, but appears to show stool throughout the colon.  GI consulted, status post manual disimpaction.  Continue aggressive bowel regimen, significantly improved, continues to have good bowel movements.  Remains with severe rectal pain at times.  Daughter tells me over the phone that she asked for IV Dilaudid overnight but he never received it.  Feels like tramadol does not work.  Switch to oxycodone, understanding the risk of him becoming constipated.  Continue aggressive bowel regimen  Chronic systolic CHF (congestive heart failure) ([place])-  (present on admission) -Following NSTEMI in January 2023 patient was noted to have gone into cardiogenic shock and required temporary stay in ICU requiring pressors which were able to be weaned off and he was transition to midodrine.  EF 35-40% on echocardiogram with moderate mitral regurgitation. Currently patient does not appear grossly fluid overloaded.  Continue midodrine, fluid management per dialysis  Liver lesions, elevated LFTs - CT scan on admission showed low-attenuation lesions measuring about 12 mm diameter in the liver.  Further imaging with CT scan with IV contrast as well as MRI showing fairly benign lesions  CAD (coronary artery disease)- (present on admission) -Patient had recent NSTEMI 04/2021 for which he underwent cardiac catheterization noting RCA occlusion with diffuse disease of the LAD and left circumflex.  It was not amendable to PCI and he was not a surgical candidate for CABG. Continue statin  Paroxysmal atrial fibrillation ([place]) -Patient appears to be in sinus rhythm at this time and rate controlled.  He was initially maintained on heparin, switch to Eliquis today  ESRD on hemodialysis [facility]) -He has new end-stage renal disease following his most recent hospital stay.  He was discharged January 2023.  Currently MWF.  Just had an AV fistula placed, it has not matured and currently having dialysis via HD cath.  Anxiety and Depression - Psychiatry consulted, started on Remeron 7.5 mg nightly for depression, insomnia, poor appetite.  He was also started on hydroxyzine 10 mg to use before dialysis for anxiety  Transaminitis- (present on admission) -AST was noted to be mildly elevated at 55, but lipase was noted to be within normal limits.  Possibly related with patient's prior history of abuse.  Normocytic anemia- (present  on admission) -Stable.  No bleeding  ETOH abuse -Patient has not not drank alcohol since he was admitted to the hospital in December 2022. Continue  to encourage abstinence from alcohol  Dysphagia -Patient complains of food and liquids getting stuck in his throat causing him to vomit it back up.  Denies complaints of coughing while eating.  He has a prior history of alcohol abuse.  Management per gastroenterology, currently advanced to regular diet.  Suspected acute cholecystitis -Patient presented with complaints of abdominal pain with nausea and vomiting on admission.  CT scan of the abdomen pelvis significant for increased density in the gallbladder concerning for milk of calcium or large gallstones with gallbladder wall thickening.  General surgery has been consulted and evaluated patient, and they do not feel that imaging and physical exam and symptoms are consistent with acute cholecystitis.  They signed off.  He was initially placed on ceftriaxone but it has been discontinued now  Weight loss -family reports 20-30 pound weight loss over the last month.  Question if multifactorial in the recent NSTEMI, ESRD, and constipation with poor p.o. intake.  TSH unremarkable.   HLD (hyperlipidemia)- (present on admission) -Continue statin   Scheduled Meds:  (feeding supplement) PROSource Plus  30 mL Oral TID BM   apixaban  5 mg Oral BID   bisacodyl  10 mg Rectal Daily   Chlorhexidine Gluconate Cloth  6 each Topical Q0600   Chlorhexidine Gluconate Cloth  6 each Topical Q0600   darbepoetin (ARANESP) injection - DIALYSIS  60 mcg Intravenous Q Mon-HD   diltiazem   Topical BID   ferric citrate  420 mg Oral TID WC   gabapentin  100 mg Oral BID   hydrocortisone  25 mg Rectal BID   hydrOXYzine  10 mg Oral Q M,W,F-1800   magnesium oxide  200 mg Oral Daily   midodrine  10 mg Oral TID WC   midodrine  10 mg Oral Q M,W,F-HD   mirtazapine  7.5 mg Oral QHS   multivitamin  1 tablet Oral QHS   polyethylene glycol  17 g Oral BID   senna-docusate  2 tablet Oral BID   sodium chloride flush  3 mL Intravenous Q12H   Continuous Infusions:  heparin 600  Units/hr (06/18/21 1014)   promethazine (PHENERGAN) injection (IM or IVPB) Stopped (06/13/21 2334)   PRN Meds:.acetaminophen **OR** acetaminophen, albuterol, heparin sodium (porcine), HYDROmorphone (DILAUDID) injection, hydrOXYzine, ondansetron (ZOFRAN) IV, oxyCODONE, promethazine (PHENERGAN) injection (IM or IVPB)  Diet Orders (From admission, onward)     Start     Ordered   06/15/21 1124  Diet regular Room service appropriate? Yes; Fluid consistency: Thin; Fluid restriction: 1200 mL Fluid  Diet effective now       Question Answer Comment  Room service appropriate? Yes   Fluid consistency: Thin   Fluid restriction: 1200 mL Fluid      06/15/21 1123            DVT prophylaxis:  apixaban (ELIQUIS) tablet 5 mg   Lab Results  Component Value Date   PLT 142 (L) 06/19/2021      Code Status: Full Code  Family Communication: Discussed with daughter over the phone  Status is: Inpatient  Remains inpatient appropriate because: Continues to have significant rectal pain, asking for IV Dilaudid but family tells me he has not been receiving it.    Level of care: Telemetry Medical  Consultants:  GI Nephrology   Procedures:  none  Microbiology  none  Antimicrobials: none    Objective: Vitals:   06/19/21 1200 06/19/21 1205 06/19/21 1209 06/19/21 1237  BP: 101/74 115/69 121/74 111/66  Pulse: 80 79 81 78  Resp: 18 19 17 17   Temp:   98.2 F (36.8 C) 98 F (36.7 C)  TempSrc:   Oral   SpO2:   98% 100%  Weight:   69 kg   Height:        Intake/Output Summary (Last 24 hours) at 06/19/2021 1307 Last data filed at 06/19/2021 1209 Gross per 24 hour  Intake 1380 ml  Output 1500 ml  Net -120 ml   Wt Readings from Last 3 Encounters:  06/19/21 69 kg  06/02/21 23.1 kg  05/28/21 75.1 kg    Examination:  Constitutional: NAD Eyes: no scleral icterus ENMT: Mucous membranes are moist.  Neck: normal, supple Respiratory: clear to auscultation bilaterally, no wheezing,  no crackles. Normal respiratory effort. No accessory muscle use.  Cardiovascular: Regular rate and rhythm, no murmurs / rubs / gallops. No LE edema.  Abdomen: non distended, no tenderness. Bowel sounds positive.  Musculoskeletal: no clubbing / cyanosis.  Skin: no rashes Neurologic: non focal    Data Reviewed: I have independently reviewed following labs and imaging studies   CBC Recent Labs  Lab 06/15/21 0239 06/16/21 0106 06/17/21 0738 06/18/21 0209 06/19/21 0247  WBC 6.7 7.1 6.6 6.6 7.3  HGB 9.8* 9.7* 10.0* 10.1* 9.9*  HCT 29.8* 29.1* 29.9* 30.1* 30.9*  PLT 168 156 150 152 142*  MCV 99.0 99.0 97.7 98.4 98.7  MCH 32.6 33.0 32.7 33.0 31.6  MCHC 32.9 33.3 33.4 33.6 32.0  RDW 18.2* 18.6* 19.5* 19.9* 19.9*    Recent Labs  Lab 06/14/21 0041 06/15/21 0239 06/16/21 0106 06/17/21 0738 06/18/21 0209 06/19/21 0247  NA 134* 138 137 134* 135 133*  K 5.0 4.0 4.1 4.5 4.1 4.0  CL 95* 97* 98 97* 96* 95*  CO2 22 23 24  21* 24 24  GLUCOSE 118* 77 107* 153* 112* 107*  BUN 64* 38* 56* 83* 44* 66*  CREATININE 7.18* 5.14* 6.93* 8.67* 5.61* 7.25*  CALCIUM 9.5 8.8* 9.2 9.1 8.2* 8.8*  AST 374* 299* 208* 111* 83*  --   ALT 336* 301* 253* 193* 154*  --   ALKPHOS 227* 217* 190* 164* 138*  --   BILITOT 1.5* 1.6* 1.2 1.3* 1.4*  --   ALBUMIN 3.2*   3.1* 3.2* 3.0* 3.1* 3.0* 3.0*  BNP >4,500.0*  --   --   --   --   --     ------------------------------------------------------------------------------------------------------------------ No results for input(s): CHOL, HDL, LDLCALC, TRIG, CHOLHDL, LDLDIRECT in the last 72 hours.  Lab Results  Component Value Date   HGBA1C 5.7 (H) 04/30/2021   ------------------------------------------------------------------------------------------------------------------ No results for input(s): TSH, T4TOTAL, T3FREE, THYROIDAB in the last 72 hours.  Invalid input(s): FREET3  Cardiac Enzymes No results for input(s): CKMB, TROPONINI, MYOGLOBIN in the last  168 hours.  Invalid input(s): CK ------------------------------------------------------------------------------------------------------------------    Component Value Date/Time   BNP >4,500.0 (H) 06/14/2021 0041    CBG: Recent Labs  Lab 06/18/21 1628 06/18/21 1951 06/18/21 2343 06/19/21 0749 06/19/21 1237  GLUCAP 164* 147* 113* 94 69*    No results found for this or any previous visit (from the past 240 hour(s)).   Radiology Studies: No results found.   Marzetta Board, MD, [name] [facility]  Between 7 am - 7 pm I am available, please contact me via [place] (for emergencies) or [place] (  non urgent messages)  Between 7 pm - 7 am I am not available, please contact night coverage MD/APP via Amion

## 2021-06-19 NOTE — Progress Notes (Signed)
Patient rested through out the night, received pain medication x 1 with relief noted thus far. Wife at bedside.

## 2021-06-19 NOTE — Assessment & Plan Note (Addendum)
-   Psychiatry consulted, started on Remeron 7.5 mg nightly for depression, insomnia, poor appetite.  Continue on discharge

## 2021-06-19 NOTE — Progress Notes (Addendum)
ANTICOAGULATION CONSULT NOTE  Pharmacy Consult for Heparin > apixaban Indication: atrial fibrillation  Allergies  Allergen Reactions   Other Other (See Comments)    Pollen - sneezing, itching/watery eyes    Patient Measurements: Height: 5\' 11"  (180.3 cm) Weight: 69.4 kg (153 lb) IBW/kg (Calculated) : 75.3 kg Heparin Dosing Weight: 78 kg  Vital Signs: Temp: 97.7 F (36.5 C) (02/15 0348) BP: 105/55 (02/15 0348) Pulse Rate: 70 (02/15 0348)  Labs: Recent Labs    06/17/21 0738 06/18/21 0209 06/19/21 0247  HGB 10.0* 10.1* 9.9*  HCT 29.9* 30.1* 30.9*  PLT 150 152 142*  APTT 64* 71* 68*  HEPARINUNFRC 0.89* 0.68 0.56  CREATININE 8.67* 5.61*  --     Estimated Creatinine Clearance: 11.2 mL/min (A) (by C-G formula based on SCr of 5.61 mg/dL (H)).  Assessment: 76 yo male with a history of atrial fibrillation and CAD presents with nausea, vomiting, rectal pain, and worsening abdominal pain. PTA the patient is on apixaban, last dose on 2/3 in the evening (per the patient and family members). Pharmacy is consulted to dose heparin.  Of note, the patient had a recent admission in 04/2021 for NSTEMI, cardiogenic shock, acute systolic CHF, mitral regurgitation, and aortic valve insufficiency.  aPTT is therapeutic at 68 sec and stable. Heparin level 0.56 is trending down but still higher than expected. H/H, plt stable. aPTTs have been fluctuating with small tweaks in heparin rate; previously with APTT up to >200 on 650 units/hr so will keep current rate.   Goal of Therapy:  Heparin level 0.3-0.7 units/ml aPTT 66-102 seconds Monitor platelets by anticoagulation protocol: Yes   Plan:  Continue heparin gtt at 600 units/hr F/u aPTT until correlates with heparin level  Daily heparin level, aPTT and CBC F/u transition back to apixaban as appropriate after all procedures complete   ADDENDUM 1300:  Pharmacy consulted to transition back to apixaban. Will restart apixaban 5mg  BID and stop  heparin at 1700 to avoid two doses 10 hours apart in this dialysis patient.    Benetta Spar, PharmD, BCPS, BCCP Clinical Pharmacist  Please check AMION for all [place] phone numbers After 10:00 PM, call [place] [phone]

## 2021-06-19 NOTE — Progress Notes (Signed)
[facility] Progress Note   Subjective:   Patient seen and examined at bedside in HD.  Tolerating well so far.  No new complaints.   Objective Vitals:   06/18/21 1628 06/18/21 1949 06/19/21 0348 06/19/21 0830  BP: (!) 105/54 (!) 101/57 (!) 105/55 (!) 105/58  Pulse: 81 75 70 73  Resp: 18 16 16 18   Temp: 97.8 F (36.6 C) 97.8 F (36.6 C) 97.7 F (36.5 C) 97.9 F (36.6 C)  TempSrc: Oral   Oral  SpO2: 98% 100% 100% 98%  Weight:   69.4 kg   Height:       Physical Exam General:chronically ill appearing male in NAD Heart:RRR, no mrg Lungs:CTAB, nml WOB on RA Abdomen:NTND, +BS Extremities:no LE edema Dialysis Access: TDC in use, LU AVF +b/t   Filed Weights   06/17/21 1330 06/18/21 0500 06/19/21 0348  Weight: 69.3 kg 70.8 kg 69.4 kg    Intake/Output Summary (Last 24 hours) at 06/19/2021 0840 Last data filed at 06/18/2021 1716 Gross per 24 hour  Intake 1540 ml  Output 3 ml  Net 1537 ml    Additional Objective Labs: Basic Metabolic Panel: Recent Labs  Lab 06/13/21 0115 06/14/21 0041 06/15/21 0239 06/17/21 0738 06/18/21 0209 06/19/21 0247  NA 135 134*   < > 134* 135 133*  K 4.5 5.0   < > 4.5 4.1 4.0  CL 96* 95*   < > 97* 96* 95*  CO2 24 22   < > 21* 24 24  GLUCOSE 81 118*   < > 153* 112* 107*  BUN 44* 64*   < > 83* 44* 66*  CREATININE 5.26* 7.18*   < > 8.67* 5.61* 7.25*  CALCIUM 9.1 9.5   < > 9.1 8.2* 8.8*  PHOS 5.8* 7.7*  --   --   --  6.5*   < > = values in this interval not displayed.   Liver Function Tests: Recent Labs  Lab 06/16/21 0106 06/17/21 0738 06/18/21 0209 06/19/21 0247  AST 208* 111* 83*  --   ALT 253* 193* 154*  --   ALKPHOS 190* 164* 138*  --   BILITOT 1.2 1.3* 1.4*  --   PROT 5.8* 6.3* 6.0*  --   ALBUMIN 3.0* 3.1* 3.0* 3.0*   CBC: Recent Labs  Lab 06/15/21 0239 06/16/21 0106 06/17/21 0738 06/18/21 0209 06/19/21 0247  WBC 6.7 7.1 6.6 6.6 7.3  HGB 9.8* 9.7* 10.0* 10.1* 9.9*  HCT 29.8* 29.1* 29.9* 30.1* 30.9*   MCV 99.0 99.0 97.7 98.4 98.7  PLT 168 156 150 152 142*   CBG: Recent Labs  Lab 06/18/21 1146 06/18/21 1628 06/18/21 1951 06/18/21 2343 06/19/21 0749  GLUCAP 121* 164* 147* 113* 94    Studies/Results: MR LIVER W WO CONTRAST  Result Date: 06/17/2021 CLINICAL DATA:  Inpatient. Indeterminate left liver lesions on recent CT. EXAM: MRI ABDOMEN WITHOUT AND WITH CONTRAST TECHNIQUE: Multiplanar multisequence MR imaging of the abdomen was performed both before and after the administration of intravenous contrast. CONTRAST:  67mL GADAVIST GADOBUTROL 1 MMOL/ML IV SOLN COMPARISON:  06/12/2021 CT abdomen/pelvis. FINDINGS: Substantially motion degraded scan, particularly on the postcontrast sequences, limiting assessment. Lower chest: Small dependent right pleural effusion with dependent right lung base atelectasis, unchanged from recent CT. Mild cardiomegaly. Hepatobiliary: Normal liver size and configuration. No hepatic steatosis. Numerous benign-appearing liver cysts scattered throughout the liver, several of which are lobulated and demonstrate thin internal septations, for example measuring 1.5 cm in the segment 4A left  liver (series 4/image 15) and 1.5 cm in the central left liver (series 4/image 19), stable in size since 08/10/2017 CT abdomen study. No suspicious liver masses. Layering tiny gallstones and layering mild sludge in the gallbladder. Moderate diffuse gallbladder wall thickening. No significant gallbladder distention. No biliary ductal dilatation. Common bile duct diameter 2 mm. No convincing choledocholithiasis on these motion degraded imagesa. No biliary masses, strictures or beading. Pancreas: No pancreatic mass or duct dilation.  No pancreas divisum. Spleen: Normal size. No mass. Adrenals/Urinary Tract: Normal adrenals. No hydronephrosis. Several simple renal cortical cysts scattered throughout both kidneys, largest 1.7 cm in the upper right kidney and 1.9 cm in the interpolar anterior left  kidney. Homogeneous T2 hypointense 1.0 cm medial interpolar right renal cortical lesion (series 4/image 21) and homogeneous T2 hypointense subcentimeter lateral upper left renal cortical lesion (series 3/image 13), poorly assessed for enhancement on the substantially motion degraded postcontrast sequences. Stomach/Bowel: Normal non-distended stomach. Visualized small and large bowel is normal caliber, with no bowel wall thickening. Vascular/Lymphatic: Atherosclerotic nonaneurysmal abdominal aorta. Patent portal, splenic, hepatic and renal veins. No pathologically enlarged lymph nodes in the abdomen. Other: Trace perihepatic ascites.  No focal fluid collections. Musculoskeletal: No aggressive appearing focal osseous lesions. IMPRESSION: 1. Substantially motion degraded scan, limiting assessment. 2. Benign liver cysts, stable since 2019 CT. No suspicious liver lesions. 3. Two small indeterminate T2 hypointense renal cortical lesions, largest 1.0 cm in the medial interpolar right kidney, for which assessment for enhancement is nondiagnostic due to substantial motion degradation. Follow-up MRI abdomen without and with IV contrast recommended in 6 months. 4. Cholelithiasis and mild gallbladder sludge. Moderate diffuse gallbladder wall thickening, nonspecific, without significant gallbladder distention. No biliary ductal dilatation. No convincing choledocholithiasis on the motion degraded sequences. 5. Small dependent right pleural effusion with dependent right lung base atelectasis, unchanged from recent CT. 6. Trace perihepatic ascites. Electronically Signed   By: Ilona Sorrel M.D.   On: 06/17/2021 09:49    Medications:  heparin 600 Units/hr (06/18/21 1014)   promethazine (PHENERGAN) injection (IM or IVPB) Stopped (06/13/21 2334)    (feeding supplement) PROSource Plus  30 mL Oral TID BM   bisacodyl  10 mg Rectal Daily   Chlorhexidine Gluconate Cloth  6 each Topical Q0600   Chlorhexidine Gluconate Cloth  6 each  Topical Q0600   darbepoetin (ARANESP) injection - DIALYSIS  60 mcg Intravenous Q Mon-HD   diltiazem   Topical BID   ferric citrate  210 mg Oral TID WC   gabapentin  100 mg Oral BID   hydrocortisone  25 mg Rectal BID   hydrOXYzine  10 mg Oral Q M,W,F-1800   magnesium oxide  200 mg Oral Daily   midodrine  10 mg Oral TID WC   midodrine  10 mg Oral Q M,W,F-HD   mirtazapine  7.5 mg Oral QHS   multivitamin  1 tablet Oral QHS   polyethylene glycol  17 g Oral BID   senna-docusate  2 tablet Oral BID   sodium chloride flush  3 mL Intravenous Q12H    Dialysis Orders: MWF at [facility] 4hr, 400/500, EDW 72kg (although leaving 74kg range), 2K/2Ca, TDC (+ maturing AVF), heparin 2000 unit bolus - No ESA or VDRA   Assessment/Plan: Proctitis/ rectal ulceration/ fecal impaction: On aggressive bowel regimen.  Disimpacted by GI w/large amount of stool removed. Having good BM but continued rectal pain.  GI following - FFS on 2/10 rectal ulceration/proctitis, biopsies taken.  Liver/kidney lesions: Elevated LFTs. MRI showed benign  liver cysts stable since 2019, no suspicious liver lesions, 2 small indeterminate T2 hypointense renal cortical lesions.  Dysphagia: Swallow studies normal.  ESRD:  MWF.  Next HD today. Hypotension/volume: Chronically low BP, on midodrine 10mg  TID. Ordered prn midodrine to be given pre HD. Continue UF as BP allows. Under EDW, will need to be lowered on d/c.   Anemia: Hgb 9.9 - FOBT negative. Tsat 24%, IV iron ordered. Aranesp 60 q Monday.  Metabolic bone disease: CorrCa and Phos high.  Increase Auryxia to 2 AC TID.  CAD/HFrEF - missed outpatient cardiology follow up.   Will need to reschedule after d/c unless need to be seen as inpatient. A-fib: On amiodarone. Eliquis on hold. Heparin per pharmacy.  Nutrition - On regular diet. RD concerned for poor intake. Prot supp for low albumin.  Follow labs.  Depression/anxiety - psych consult.  Not a danger to himself.   Jen Mow,  PA-C [facility] 06/19/2021,8:40 AM  LOS: 11 days

## 2021-06-19 NOTE — Progress Notes (Signed)
Mobility Specialist Progress Note:   06/19/21 1557  Mobility  Activity Refused mobility   Pt resting upon entry. Asked about ambulation and he said he's tired and will try tomorrow.   [facility] Public librarian Phone [phone]

## 2021-06-19 NOTE — Progress Notes (Signed)
Mobility Specialist Progress Note:   06/19/21 1008  Mobility  Activity Off unit   Pt at dialysis. Will follow-up as time allows.    [facility] Public librarian Phone [phone]

## 2021-06-19 NOTE — Progress Notes (Signed)
°   06/19/21 1209  Vitals  Temp 98.2 F (36.8 C)  Temp Source Oral  BP 121/74  BP Location Right Arm  BP Method Automatic  Patient Position (if appropriate) Lying  Pulse Rate 81  Pulse Rate Source Monitor  Resp 17  Oxygen Therapy  SpO2 98 %  O2 Device Nasal Cannula  O2 Flow Rate (L/min) 2 L/min  Dialysis Weight  Weight 69 kg  Type of Weight Post-Dialysis  Post-Hemodialysis Assessment  Rinseback Volume (mL) 250 mL  KECN 246 V  Dialyzer Clearance Lightly streaked  Duration of HD Treatment -hour(s) 3.5 hour(s)  Hemodialysis Intake (mL) 500 mL  UF Total -Machine (mL) 2000 mL  Net UF (mL) 1500 mL  Tolerated HD Treatment Yes  Post-Hemodialysis Comments tx complete, pt stable  Hemodialysis Catheter Right Subclavian  No placement date or time found.   Orientation: Right  Access Location: Subclavian  Site Condition No complications  Blue Lumen Status Flushed  Red Lumen Status Dead end cap in place;Heparin locked  Purple Lumen Status Flushed;Heparin locked;Dead end cap in place  Catheter fill solution Heparin 1000 units/ml  Catheter fill volume (Arterial) 1.9 cc  Catheter fill volume (Venous) 1.9  Dressing Type Transparent  Dressing Status Clean, Dry, Intact  Interventions Antimicrobial disc changed;Dressing changed  Drainage Description None  Dressing Change Due 06/26/21  Post treatment catheter status Capped and Clamped

## 2021-06-20 ENCOUNTER — Other Ambulatory Visit (HOSPITAL_COMMUNITY): Payer: Self-pay

## 2021-06-20 LAB — CBC
HCT: 29.8 % — ABNORMAL LOW (ref 39.0–52.0)
Hemoglobin: 9.9 g/dL — ABNORMAL LOW (ref 13.0–17.0)
MCH: 33 pg (ref 26.0–34.0)
MCHC: 33.2 g/dL (ref 30.0–36.0)
MCV: 99.3 fL (ref 80.0–100.0)
Platelets: 145 10*3/uL — ABNORMAL LOW (ref 150–400)
RBC: 3 MIL/uL — ABNORMAL LOW (ref 4.22–5.81)
RDW: 19.6 % — ABNORMAL HIGH (ref 11.5–15.5)
WBC: 6.8 10*3/uL (ref 4.0–10.5)
nRBC: 0 % (ref 0.0–0.2)

## 2021-06-20 LAB — GLUCOSE, CAPILLARY
Glucose-Capillary: 108 mg/dL — ABNORMAL HIGH (ref 70–99)
Glucose-Capillary: 128 mg/dL — ABNORMAL HIGH (ref 70–99)
Glucose-Capillary: 137 mg/dL — ABNORMAL HIGH (ref 70–99)
Glucose-Capillary: 159 mg/dL — ABNORMAL HIGH (ref 70–99)

## 2021-06-20 MED ORDER — SENNOSIDES-DOCUSATE SODIUM 8.6-50 MG PO TABS: 2.0000 | ORAL_TABLET | Freq: Two times a day (BID) | ORAL | 0 refills | Status: AC

## 2021-06-20 MED ORDER — CHLORHEXIDINE GLUCONATE CLOTH 2 % EX PADS
6.0000 | MEDICATED_PAD | Freq: Every day | CUTANEOUS | Status: DC
  Administered 2021-06-22: 6 via TOPICAL

## 2021-06-20 MED ORDER — MIRTAZAPINE 7.5 MG PO TABS
3.7500 mg | ORAL_TABLET | Freq: Every day | ORAL | Status: DC
Start: 2021-06-20 — End: 2021-06-27
  Administered 2021-06-20 – 2021-06-26 (×7): 3.75 mg via ORAL
  Filled 2021-06-20 (×8): qty 1

## 2021-06-20 MED ORDER — HYDROCORTISONE ACETATE 25 MG RE SUPP: 25.0000 mg | Freq: Two times a day (BID) | RECTAL | 0 refills | Status: AC

## 2021-06-20 MED ORDER — MIRTAZAPINE 7.5 MG PO TABS: 7.5000 mg | ORAL_TABLET | Freq: Every day | ORAL | 0 refills | Status: DC

## 2021-06-20 MED ORDER — HYDROXYZINE HCL 10 MG PO TABS: 10.0000 mg | ORAL_TABLET | ORAL | 0 refills | Status: DC

## 2021-06-20 MED ORDER — OXYCODONE HCL 5 MG PO TABS: 5.0000 mg | ORAL_TABLET | Freq: Four times a day (QID) | ORAL | 0 refills | Status: DC | PRN

## 2021-06-20 MED ORDER — POLYETHYLENE GLYCOL 3350 17 G PO PACK
17.0000 g | PACK | Freq: Two times a day (BID) | ORAL | 0 refills | Status: AC
Start: 2021-06-20 — End: 2021-07-20

## 2021-06-20 NOTE — Progress Notes (Signed)
D/C order noted. Contacted [place] [facility] and made clinic aware that pt will d/c today and resume care tomorrow.   Melven Sartorius Renal Navigator [phone]

## 2021-06-20 NOTE — Progress Notes (Unsigned)
POST OPERATIVE OFFICE NOTE    CC:  F/u for surgery  HPI:  This is a 76 y.o. male who is ESRD s/p Left arm brachial artery to axillary vein arteriovenous graft on 05/28/21 by Dr. Virl Cagey.  On HD via [place] placed by IR 05/21/21.  He is here today for f/u incision check.  He denies pain, loss or motor or sensation in left UE.      Allergies  Allergen Reactions   Other Other (See Comments)    Pollen - sneezing, itching/watery eyes    No current facility-administered medications for this visit.   No current outpatient medications on file.   Facility-Administered Medications Ordered in Other Visits  Medication Dose Route Frequency Provider Last Rate Last Admin   (feeding supplement) PROSource Plus liquid 30 mL  30 mL Oral TID BM Lynnda Child, PA-C   30 mL at 06/19/21 2003   acetaminophen (TYLENOL) tablet 650 mg  650 mg Oral Q6H PRN Norval Morton, MD       Or   acetaminophen (TYLENOL) suppository 650 mg  650 mg Rectal Q6H PRN Fuller Plan A, MD       albuterol (PROVENTIL) (2.5 MG/3ML) 0.083% nebulizer solution 2.5 mg  2.5 mg Nebulization Q6H PRN Fuller Plan A, MD       apixaban (ELIQUIS) tablet 5 mg  5 mg Oral BID Donnamae Jude, RPH   5 mg at 06/19/21 1731   bisacodyl (DULCOLAX) suppository 10 mg  10 mg Rectal Daily Vladimir Crofts, PA-C   10 mg at 06/18/21 0941   Chlorhexidine Gluconate Cloth 2 % PADS 6 each  6 each Topical Q0600 Loren Racer, PA-C   6 each at 06/19/21 0553   Chlorhexidine Gluconate Cloth 2 % PADS 6 each  6 each Topical Q0600 Penninger, Ria Comment, [place]   6 each at 06/19/21 0553   Darbepoetin Alfa (ARANESP) injection 60 mcg  60 mcg Intravenous Q Mon-HD Loren Racer, PA-C   60 mcg at 06/17/21 1238   Diltiazem 2% gel (15 ml container)   Topical BID Carol Ada, MD   Given at 06/19/21 2241   ferric citrate (AURYXIA) tablet 420 mg  420 mg Oral TID WC Penninger, Lindsay, PA   420 mg at 06/19/21 1731   gabapentin (NEURONTIN) capsule 100 mg  100 mg Oral  BID Irene Pap N, DO   100 mg at 06/19/21 2207   heparin sodium (porcine) injection 3,800 Units  3,800 Units Intracatheter PRN PenningerRia Comment, [place]   3,800 Units at 06/19/21 1143   hydrocortisone (ANUSOL-HC) suppository 25 mg  25 mg Rectal BID Fuller Plan A, MD   25 mg at 06/19/21 2207   HYDROmorphone (DILAUDID) injection 1 mg  1 mg Intravenous Q3H PRN Caren Griffins, MD   1 mg at 06/19/21 2244   hydrOXYzine (ATARAX) tablet 10 mg  10 mg Oral TID PRN Irene Pap N, DO   10 mg at 06/13/21 1030   hydrOXYzine (ATARAX) tablet 10 mg  10 mg Oral Q M,W,F-1800 Doda, Vandana, MD   10 mg at 06/19/21 1042   magnesium oxide (MAG-OX) tablet 200 mg  200 mg Oral Daily Tamala Julian, Rondell A, MD   200 mg at 06/19/21 1303   midodrine (PROAMATINE) tablet 10 mg  10 mg Oral TID WC Smith, Rondell A, MD   10 mg at 06/19/21 1731   midodrine (PROAMATINE) tablet 10 mg  10 mg Oral Q M,W,F-HD Penninger, Ria Comment, PA   10 mg  at 06/19/21 1130   mirtazapine (REMERON) tablet 7.5 mg  7.5 mg Oral QHS Armando Reichert, MD   7.5 mg at 06/19/21 2208   multivitamin (RENA-VIT) tablet 1 tablet  1 tablet Oral QHS Caren Griffins, MD   1 tablet at 06/19/21 2207   ondansetron (ZOFRAN) injection 4 mg  4 mg Intravenous Q6H PRN Caren Griffins, MD   4 mg at 06/13/21 1918   oxyCODONE (Oxy IR/ROXICODONE) immediate release tablet 5 mg  5 mg Oral Q6H PRN Caren Griffins, MD       polyethylene glycol (MIRALAX / GLYCOLAX) packet 17 g  17 g Oral BID Vladimir Crofts, PA-C   17 g at 06/19/21 2207   promethazine (PHENERGAN) 6.25 mg in sodium chloride 0.9 % 50 mL IVPB  6.25 mg Intravenous Q6H PRN Caren Griffins, MD   Stopped at 06/13/21 2334   senna-docusate (Senokot-S) tablet 2 tablet  2 tablet Oral BID Caren Griffins, MD   2 tablet at 06/19/21 2207   sodium chloride flush (NS) 0.9 % injection 3 mL  3 mL Intravenous Q12H Smith, Rondell A, MD   3 mL at 06/19/21 2240     ROS:  See HPI  Physical Exam:  ***  Incision:   *** Extremities:  *** Neuro: *** Abdomen:  ***   Assessment/Plan:  This is a 76 y.o. male who is s/p: ***  -***   *** [facility] [phone]   Clinic MD:  ***

## 2021-06-20 NOTE — Discharge Summary (Addendum)
Physician Discharge Summary  Ruben [name] QZE:[id number] DOB: 06-17-1945 DOA: 06/07/2021  PCP: [name], FNP  Admit date: 06/07/2021 Discharge date: 06/20/2021  Admitted From: home Disposition:  home  Recommendations for Outpatient Follow-up:  Follow up with PCP in 1-2 weeks Follow up with Dr Collene Mares in 2 weeks  Home Health: PT Equipment/Devices: walker  Discharge Condition: stable CODE STATUS: Full code Diet recommendation: renal   HPI: Per admitting MD, Ruben [name] is a 76 y.o. male with medical history significant of hypertension, hyperlipidemia, ESRD on HD, CAD, CHF, A. fib with RVR, prior alcohol abuse, and tobacco abuse who presents with complaints of rectal pain and difficulty swallowing over the last 2 wks.  History is obtained from patient with assistance of his wife and daughter who are present at bedside. Last hospitalized 04/29/2021-05/28/2021, after presenting with NSTEMI and acute respiratory hypoxia secondary to a systolic heart failure exacerbation.  Patient has undergone heart cath on 1/5, found to have occluded RCA and diffuse disease of the LAD and left circumflex that was not amendable to intervention.  CT surgery had evaluated and patient, but felt not to be a surgical candidate.  TEE noted EF noted to be 35-40%.  Kidney function continued to decline which HD catheter had been placed.  Vascular was consulted and placed AV fistula graft, and patient was set up for hemodialysis in outpatient setting.  Since being discharged from the hospital patient states that he has not been doing well.  He complains of having a poor appetite and being constipated.  Reports having severe rectal pain whenever trying to have a bowel movement with nothing coming out or just a smear.  He had followed up with his primary care provider and been seen in the ED and due to his symptoms.  Reports that he had been taking stool softeners, sitz baths, Preparation H ointment, and  suppositories without relief in symptoms.  Wife notes that he will have these episodes where he just started shaking all over due to pain and that she had seen external hemorrhoids present, but they had been shrinking.  He had not really been able to tolerate the sitz bath's as he feels like his bottom is falling out.  Since his heart attack he also states that he has had little to no appetite and has felt like food and liquid gets stuck in his throat at times which causes him to vomit it back up.  Wife notes 30 pound weight loss over the last month.  Denies any significant fever, blood in stools, or coughing with eating. On admission patient was found to be afebrile with stable vital signs.  Labs from 2/3 noted hemoglobin 9.4, potassium 3.6, BUN 10, creatinine 2.8, calcium 8.3, AST, ALT 32, and high-sensitivity troponin 63->66.  CT scan of the chest noted small bilateral pleural effusions with bibasilar atelectasis, indeterminate lesions in the liver and kidneys, increased density in the gallbladder concerning for milk of calcium or large gallstones with gallbladder wall thickening, and evidence of necrosis of bilateral femoral heads.  Patient has been given 1 L lactated Ringer's, Zofran, fentanyl for pain, and Rocephin 2 g.  General surgery have been consulted, but recommended hospitalist admission now.  Hospital Course / Discharge diagnoses: Assessment and Plan: Rectal pain and constipation- (present on admission) -patient reports complaints of constipation and rectal pain for which she has been unable to have adequate bowel movement despite taking stool softeners, sitz bath's, use of Preparation H, and suppositories. CT scan  of the abdomen pelvis did not note any signs of fecal impaction or bowel obstruction, but appears to show stool throughout the colon.  GI consulted, status post manual disimpaction.  Continue aggressive bowel regimen, significantly improved, continues to have good bowel movements.   Remains with rectal pain at times.  Continue MiraLAX, Senokot, ensure adequate hydration, hydrocortisone suppositories.  Chronic systolic CHF (congestive heart failure) ([place])- (present on admission) -Following NSTEMI in January 2023 patient was noted to have gone into cardiogenic shock and required temporary stay in ICU requiring pressors which were able to be weaned off and he was transition to midodrine.  EF 35-40% on echocardiogram with moderate mitral regurgitation. Currently patient does not appear grossly fluid overloaded.  Continue midodrine, fluid management per dialysis  Liver lesions, elevated LFTs - CT scan on admission showed low-attenuation lesions measuring about 12 mm diameter in the liver.  Further imaging with CT scan with IV contrast as well as MRI showing fairly benign lesions  CAD (coronary artery disease)- (present on admission) -Patient had recent NSTEMI 04/2021 for which he underwent cardiac catheterization noting RCA occlusion with diffuse disease of the LAD and left circumflex.  It was not amendable to PCI and he was not a surgical candidate for CABG. Continue statin  Paroxysmal atrial fibrillation ([place]) -Patient appears to be in sinus rhythm at this time and rate controlled.  Continue home medications, anticoagulation with Eliquis  ESRD on hemodialysis [facility]) -He has new end-stage renal disease following his most recent hospital stay.  He was discharged January 2023.  Currently MWF.  Just had an AV fistula placed, it has not matured and currently having dialysis via HD cath.  Anxiety and Depression - Psychiatry consulted, started on Remeron 7.5 mg nightly for depression, insomnia, poor appetite.  Continue on discharge  Transaminitis- (present on admission) -AST was noted to be mildly elevated at 55, but lipase was noted to be within normal limits.  Possibly related with patient's prior history of abuse.  Normocytic anemia- (present on admission) -Stable.  No  bleeding  ETOH abuse -Patient has not not drank alcohol since he was admitted to the hospital in December 2022. Continue to encourage abstinence from alcohol  Dysphagia -Patient complains of food and liquids getting stuck in his throat causing him to vomit it back up.  Denies complaints of coughing while eating.  He has a prior history of alcohol abuse.  Management per gastroenterology, currently advanced to regular diet.  Suspected acute cholecystitis -Patient presented with complaints of abdominal pain with nausea and vomiting on admission.  CT scan of the abdomen pelvis significant for increased density in the gallbladder concerning for milk of calcium or large gallstones with gallbladder wall thickening.  General surgery has been consulted and evaluated patient, and they do not feel that imaging and physical exam and symptoms are consistent with acute cholecystitis.  They signed off.  He was initially placed on ceftriaxone but it has been discontinued now  Weight loss -family reports 20-30 pound weight loss over the last month.  Question if multifactorial in the recent NSTEMI, ESRD, and constipation with poor p.o. intake.  TSH unremarkable.   HLD (hyperlipidemia)- (present on admission) -Continue statin   Sepsis ruled out   Discharge Instructions   Allergies as of 06/20/2021       Reactions   Other Other (See Comments)   Pollen - sneezing, itching/watery eyes        Medication List     STOP taking these medications  polyethylene glycol powder 17 GM/SCOOP powder Commonly known as: GLYCOLAX/MIRALAX Replaced by: polyethylene glycol 17 g packet   traMADol 50 MG tablet Commonly known as: Ultram       TAKE these medications    acetaminophen 650 MG CR tablet Commonly known as: TYLENOL Take 650 mg by mouth every 8 (eight) hours as needed for pain.   albuterol (2.5 MG/3ML) 0.083% nebulizer solution Commonly known as: PROVENTIL Take 3 mLs (2.5 mg total) by  nebulization every 4 (four) hours as needed for wheezing or shortness of breath.   amiodarone 200 MG tablet Commonly known as: PACERONE Take 1 tablet (200 mg total) by mouth daily.   atorvastatin 80 MG tablet Commonly known as: LIPITOR Take 1 tablet (80 mg total) by mouth daily.   cholecalciferol 25 MCG (1000 UNIT) tablet Commonly known as: VITAMIN D3 Take 1,000 Units by mouth daily.   diltiazem 2 % Gel Apply 1 application topically 2 (two) times daily. Apply to anal fissures   Eliquis 5 MG Tabs tablet Generic drug: apixaban Take 1 tablet (5 mg total) by mouth 2 (two) times daily.   hydrocortisone 25 MG suppository Commonly known as: ANUSOL-HC Place 1 suppository (25 mg total) rectally 2 (two) times daily.   hydrOXYzine 10 MG tablet Commonly known as: ATARAX Take 1 tablet (10 mg total) by mouth every Monday, Wednesday, and Friday at 6 PM. Start taking on: June 21, 2021   Lidocaine-Hydrocortisone Ace 3-2.5 % Kit Place 1 application rectally 2 (two) times daily.   Magnesium 200 MG Tabs Take 1 tablet (200 mg total) by mouth daily. With evening meal What changed: additional instructions   midodrine 10 MG tablet Commonly known as: PROAMATINE Take 1 tablet (10 mg total) by mouth 3 (three) times daily with meals.   mirtazapine 7.5 MG tablet Commonly known as: REMERON Take 1 tablet (7.5 mg total) by mouth at bedtime.   multivitamin Tabs tablet Take 1 tablet by mouth at bedtime.   nitroGLYCERIN 0.4 MG SL tablet Commonly known as: Nitrostat Place 1 tablet (0.4 mg total) under the tongue every 5 (five) minutes as needed for chest pain.   ondansetron 4 MG tablet Commonly known as: Zofran Take 1 tablet (4 mg total) by mouth daily as needed for nausea or vomiting.   oxyCODONE 5 MG immediate release tablet Commonly known as: Oxy IR/ROXICODONE Take 1 tablet (5 mg total) by mouth every 6 (six) hours as needed for moderate pain.   phenylephrine 0.25 %  suppository Commonly known as: (USE for PREPARATION-H) Place 1 suppository rectally 2 (two) times daily.   polyethylene glycol 17 g packet Commonly known as: MIRALAX / GLYCOLAX Take 17 g by mouth 2 (two) times daily. Replaces: polyethylene glycol powder 17 GM/SCOOP powder   senna-docusate 8.6-50 MG tablet Commonly known as: Senokot-S Take 2 tablets by mouth 2 (two) times daily.       Consultations: Nephrology  GI  Procedures/Studies:  CT Abdomen Pelvis Wo Contrast  Result Date: 06/07/2021 CLINICAL DATA:  Rectal pain.  Vomiting.  Dialysis today. EXAM: CT ABDOMEN AND PELVIS WITHOUT CONTRAST TECHNIQUE: Multidetector CT imaging of the abdomen and pelvis was performed following the standard protocol without IV contrast. RADIATION DOSE REDUCTION: This exam was performed according to the departmental dose-optimization program which includes automated exposure control, adjustment of the mA and/or kV according to patient size and/or use of iterative reconstruction technique. COMPARISON:  06/02/2021 FINDINGS: Lower chest: Small bilateral pleural effusions with basilar atelectasis, developing since prior study. Cardiac enlargement.  Coronary artery calcifications. Hepatobiliary: Low-attenuation lesions measuring up to about 12 mm diameter demonstrated in the liver, most prominently in the left lobe. These are incompletely characterized on noncontrast imaging. If the patient is able to have contrast material, repeat CT with contrast recommended. Otherwise, consider ultrasound or MRI in follow-up to exclude liver mass or metastasis. Diffusely increased density of the gallbladder, more prominent than on prior study. This may represent milk of calcium sludge or large stones. The gallbladder wall appears thickened and cholecystitis is likely. No bile duct dilatation. Pancreas: Unenhanced appearance is unremarkable. Spleen: Unenhanced appearance is unremarkable. Adrenals/Urinary Tract: No adrenal gland nodules.  Multiple poorly defined low-attenuation lesions throughout both kidneys, largest measuring about 1.8 cm diameter. Some of these are hyperdense. These are incompletely characterized on noncontrast imaging but probably represent simple and hemorrhagic cysts. No change in appearance since prior study. Consider follow-up on MRI or contrast-enhanced CT as previously recommended. No hydronephrosis or hydroureter. Bladder is unremarkable. Stomach/Bowel: Stomach, small bowel, and colon are not abnormally distended. The colon is filled with residual contrast material. Appendix is normal. Vascular/Lymphatic: Calcification of the aorta. No aneurysm. Moderately prominent lymph nodes in the abdominal hiatus and gastrohepatic ligament. These are nonspecific but could represent metastatic disease or inflammatory nodes. Reproductive: Prostate gland is moderately enlarged. Other: No free air or free fluid in the abdomen. Abdominal wall musculature appears intact. Probable postoperative changes in the right inguinal region. Musculoskeletal: Degenerative changes in the spine. Sclerotic and lucent changes in the femoral heads bilaterally, greater on the right, likely representing avascular necrosis. IMPRESSION: 1. Small bilateral pleural effusions with basilar atelectasis, developing since prior study. 2. Indeterminate lesions in the liver and kidneys as discussed above. Consider follow-up with ultrasound, contrast-enhanced CT or MRI. 3. Increasing density of the gallbladder, likely milk of calcium or possibly large stones. Gallbladder wall thickening. Changes may represent acute cholecystitis. 4. No evidence of bowel obstruction or inflammation. Residual contrast material throughout the colon. 5. 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 6. Aortic atherosclerosis. Electronically Signed   By: Lucienne Capers M.D.   On: 06/07/2021 18:31   CT ABDOMEN PELVIS WO CONTRAST  Result Date:  06/02/2021 CLINICAL DATA:  76 year old male with history of acute onset of nonlocalized abdominal pain. Abdominal distension. Constipation for 1 week. EXAM: CT ABDOMEN AND PELVIS WITHOUT CONTRAST TECHNIQUE: Multidetector CT imaging of the abdomen and pelvis was performed following the standard protocol without IV contrast. RADIATION DOSE REDUCTION: This exam was performed according to the departmental dose-optimization program which includes automated exposure control, adjustment of the mA and/or kV according to patient size and/or use of iterative reconstruction technique. COMPARISON:  CT the abdomen and pelvis 08/10/2017. FINDINGS: Lower chest: Atherosclerotic calcifications in the thoracic aorta as well as the left main, left anterior descending, left circumflex and right coronary arteries. Central venous catheter tip terminating in the right atrium. Hepatobiliary: Multiple low-attenuation lesions scattered throughout the liver, similar in size and number to prior study, incompletely characterized on today's noncontrast CT examination, but likely to represent cysts. There is some amorphous high attenuation material lying dependently in the gallbladder, compatible with biliary sludge and/or tiny partially calcified gallstones. Gallbladder is not distended. No pericholecystic fluid or surrounding inflammatory changes to suggest an acute cholecystitis at this time. Pancreas: No definite pancreatic mass or peripancreatic fluid collections or inflammatory changes are noted on today's noncontrast CT examination. Spleen: Unremarkable. Adrenals/Urinary Tract: Low-attenuation lesions in the kidneys bilaterally measuring up to 1.7  cm in the upper pole of the right kidney, incompletely characterized on today's non-contrast CT examination, but statistically likely to represent cysts. In addition, in the lateral aspect of the interpolar region of the left kidney there is a 1 cm high attenuation lesion which is incompletely  characterize, but likely a proteinaceous/hemorrhagic cyst. Bilateral adrenal glands are normal in appearance. No hydroureteronephrosis. Urinary bladder is normal in appearance. Stomach/Bowel: Unenhanced appearance of the stomach is normal. There is no pathologic dilatation of small bowel or colon. A few scattered colonic diverticulae are noted, without surrounding inflammatory changes to suggest an acute diverticulitis at this time. Normal appendix. Vascular/Lymphatic: Aortic atherosclerosis, aortic atherosclerosis. No lymphadenopathy noted in the abdomen or pelvis. Reproductive: Prostate gland and seminal vesicles are unremarkable in appearance. Other: No significant volume of ascites.  No pneumoperitoneum. Musculoskeletal: There are no aggressive appearing lytic or blastic lesions noted in the visualized portions of the skeleton. IMPRESSION: 1. No acute findings are noted in the abdomen or pelvis to account for the patient's symptoms. 2. Colonic diverticulosis without evidence of acute diverticulitis at this time. 3. Numerous renal lesions bilaterally, incompletely characterized on today's noncontrast examination, but likely to represent a combination of small cysts and proteinaceous/hemorrhagic cysts. These could be definitively characterized with follow-up nonemergent abdominal MRI with and without IV gadolinium if of clinical concern. 4. Biliary sludge and/or tiny partially calcified gallstones lying dependently in the gallbladder. No findings to suggest an acute cholecystitis at this time. 5. Aortic atherosclerosis, in addition to left main and three-vessel coronary artery disease. Assessment for potential risk factor modification, dietary therapy or pharmacologic therapy may be warranted, if clinically indicated. 6. Additional incidental findings, as above. Electronically Signed   By: Vinnie Langton M.D.   On: 06/02/2021 10:27   DG Chest 2 View  Result Date: 06/08/2021 CLINICAL DATA:  Wheezing and  shortness of breath. EXAM: CHEST - 2 VIEW COMPARISON:  06/02/2021 and prior studies FINDINGS: The cardiomediastinal silhouette is unremarkable. Increasing bibasilar opacities are present which may represent atelectasis or airspace disease. A RIGHT IJ central venous catheter is again noted with tip overlying the UPPER RIGHT atrium. There is no evidence of pneumothorax or large pleural effusion. IMPRESSION: Increasing bibasilar opacities which may represent atelectasis or airspace disease. Electronically Signed   By: Margarette Canada M.D.   On: 06/08/2021 19:40   DG Abd 1 View  Result Date: 06/09/2021 CLINICAL DATA:  Abdominal pain, vomiting EXAM: ABDOMEN - 1 VIEW COMPARISON:  None. FINDINGS: There is mild dilation of small-bowel loops. Gas and stool are present in colon. Stomach is not distended. No abnormal masses or calcifications are seen. Sclerosis seen in the head of the right femur suggests possible avascular necrosis. IMPRESSION: Presence of gas in slightly dilated small bowel loops may suggest ileus. Possible avascular necrosis in the head of the right femur. Electronically Signed   By: Elmer Picker M.D.   On: 06/09/2021 12:31   CT ABDOMEN PELVIS W CONTRAST  Result Date: 06/12/2021 CLINICAL DATA:  Indeterminate liver lesions, with mildly prominent retrocrural lymph nodes on noncontrast CT. Study is performed as metastatic disease evaluation. EXAM: CT ABDOMEN AND PELVIS WITH CONTRAST TECHNIQUE: Multidetector CT imaging of the abdomen and pelvis was performed using the standard protocol following bolus administration of intravenous contrast. RADIATION DOSE REDUCTION: This exam was performed according to the departmental dose-optimization program which includes automated exposure control, adjustment of the mA and/or kV according to patient size and/or use of iterative reconstruction technique. CONTRAST:  100m OMNIPAQUE  IOHEXOL 300 MG/ML  SOLN COMPARISON:  Noncontrast abdomen and pelvis CTs recently  06/07/2021 and 06/02/2021 FINDINGS: Lower chest: Small to moderate increased right pleural effusion with extension into the major fissure. Unchanged trace left pleural fluid. There is compressive atelectasis or consolidation in the right lower lobe adjacent the effusion. Increased linear atelectasis right middle lobe. Moderate slightly worsened panchamber cardiomegaly. There are distended central pulmonary veins, three-vessel calcific CAD and a dialysis catheter terminating in the upper right atrium. There is IVC and hepatic vein reflux indicating elevated right heart pressures. Also the pulmonary trunk is prominent at 3.5 cm indicating arterial hypertension, the ascending aorta borderline aneurysmal at 3.9 cm. Hepatobiliary: Again noted are 2 irregular low-density lesions in the left lobe of the liver, with a 1.4 cm lesion near the dome and a 1.3 cm lesion in the lateral segment. There is an additional indeterminate low-density lesion along the intersegmental division measuring 1.5 cm. These lesions measure above the usual density of fluid. There are additional too small to characterize scattered hypodensities in the liver elsewhere . The larger lesions remain indeterminate and certainly could represent metastases. Liver is 19.5 cm length mildly steatotic and otherwise unremarkable. There is dense contrast in the gallbladder, prior studies showing layering sludge or gravel. There is mild thickening of the free wall which was noted on both prior studies and likely is congestive with trace pericholecystic fluid and small-volume perihepatic ascites which is increased. Pancreas: Unremarkable. Spleen: Unremarkable. Adrenals/Urinary Tract: There is slight nodular thickening of both adrenal glands. There are small bilateral renal cysts, on right largest is 1.9 cm in 15.9 Hounsfield units in the superior pole laterally. On the left largest is in the anterior hilar cortex measuring 1.9 cm and 25 Hounsfield units and does  not show any change in density between the noncontrast studies and today's arterial phase images and venous delayed phase images. There are multiple additional tiny hypodensities in both kidneys which are too small to characterize. There are scattered renal vascular calcifications at the renal hila but there are no collecting system stones, hydronephrosis or findings of acute pyelonephritis. There is mild generalized bladder thickening versus changes of underdistention. The bladder was normal in thickness on the last CT. Stomach/Bowel: No dilatation or wall thickening through the sigmoid segment including the contrast filled appendix. There are left colonic diverticula without evidence of acute diverticulitis. Moderate fold thickening does appear present in the rectum, which was previously stool-filled. Vascular/Lymphatic: There is moderate to heavy mixed aortoiliac plaque disease. There are short-segment dissections in both common iliac arteries but there is no aortic dissection or aneurysm. Some of the soft aortic plaque is ulcerative but no penetrating aortic ulcer or inflammatory changes are seen. Soft plaque causes up to 75-80% luminal stenosis in the distal left external iliac artery. 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 The visualized proximal superficial femoral arteries and common femoral arteries patent. The major visceral arteries are likewise patent. As described previously there are several slightly prominent retrocrural lymph nodes, largest measuring 10 mm in short axis. No further adenopathy is seen. Reproductive: Moderately enlarged prostate. Other: There is mild but increased body wall and mesenteric congestive features. There is small-volume abdominal and pelvic free ascites, but not sufficient for paracentesis. There is no free air or incarcerated hernia. Musculoskeletal: There are chronic lesions of osteonecrosis in both superior femoral heads, without deformity  of the femoral head articular surfaces. There are dense bones which may suggest renal osteodystrophy.  There are degenerative changes of the lumbar spine. No worrisome regional bone lesion is seen. Bridging osteophytes anterior left SI joint. IMPRESSION: 1. 3 small indeterminate lesions in the liver above the usual density of fluid. All 3 are in the left lobe, and additional scattered too small to characterize hypodensities elsewhere. Liver dedicated MRI is recommended. 2. Small renal cysts and additional bilateral too small to characterize cortical hypodensities. Attention to this on MRI recommended. 3. Slightly prominent retrocrural lymph nodes but no further adenopathy is seen. 4. Cardiomegaly with evidence of arterial hypertension, and IVC reflux consistent with elevated right heart pressures. 5. Aortic atherosclerosis. Borderline aneurysmal ectasia ascending thoracic aorta with moderate/heavy mixed abdominal aortic/iliac plaque. Some of aortic soft plaque is ulcerative but no penetrating ulcer is seen. 6. 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 7. Moderate fold thickening in the rectum. This could be due to proctitis, muscular hypertrophy or infiltrating disease. Further evaluation recommended. 8. Mild bladder thickening versus underdistention. 9. Increased right pleural effusion with adjacent consolidation or atelectasis, increased congestive changes in the body wall and mesentery and increased small-volume abdominal and pelvic ascites. 10. Mild gallbladder thickening, likely congestive. Correlate clinically for less likely cholecystitis. 11. Prostatomegaly and remaining findings described above. Electronically Signed   By: Telford Nab M.D.   On: 06/12/2021 23:07   MR LIVER W WO CONTRAST  Result Date: 06/17/2021 CLINICAL DATA:  Inpatient.  Indeterminate left liver lesions on recent CT. EXAM: MRI ABDOMEN WITHOUT AND WITH CONTRAST TECHNIQUE: Multiplanar multisequence MR imaging of the abdomen was performed both before and after the administration of intravenous contrast. CONTRAST:  86m GADAVIST GADOBUTROL 1 MMOL/ML IV SOLN COMPARISON:  06/12/2021 CT abdomen/pelvis. FINDINGS: Substantially motion degraded scan, particularly on the postcontrast sequences, limiting assessment. Lower chest: Small dependent right pleural effusion with dependent right lung base atelectasis, unchanged from recent CT. Mild cardiomegaly. Hepatobiliary: Normal liver size and configuration. No hepatic steatosis. Numerous benign-appearing liver cysts scattered throughout the liver, several of which are lobulated and demonstrate thin internal septations, for example measuring 1.5 cm in the segment 4A left liver (series 4/image 15) and 1.5 cm in the central left liver (series 4/image 19), stable in size since 08/10/2017 CT abdomen study. No suspicious liver masses. Layering tiny gallstones and layering mild sludge in the gallbladder. Moderate diffuse gallbladder wall thickening. No significant gallbladder distention. No biliary ductal dilatation. Common bile duct diameter 2 mm. No convincing choledocholithiasis on these motion degraded imagesa. No biliary masses, strictures or beading. Pancreas: No pancreatic mass or duct dilation.  No pancreas divisum. Spleen: Normal size. No mass. Adrenals/Urinary Tract: Normal adrenals. No hydronephrosis. Several simple renal cortical cysts scattered throughout both kidneys, largest 1.7 cm in the upper right kidney and 1.9 cm in the interpolar anterior left kidney. Homogeneous T2 hypointense 1.0 cm medial interpolar right renal cortical lesion (series 4/image 21) and homogeneous T2 hypointense subcentimeter lateral upper left renal cortical lesion (series 3/image 13), poorly assessed for enhancement on the substantially motion degraded  postcontrast sequences. Stomach/Bowel: Normal non-distended stomach. Visualized small and large bowel is normal caliber, with no bowel wall thickening. Vascular/Lymphatic: Atherosclerotic nonaneurysmal abdominal aorta. Patent portal, splenic, hepatic and renal veins. No pathologically enlarged lymph nodes in the abdomen. Other: Trace perihepatic ascites.  No focal fluid collections. Musculoskeletal: No aggressive appearing focal osseous lesions. IMPRESSION: 1. Substantially motion degraded scan, limiting assessment.  2. Benign liver cysts, stable since 2019 CT. No suspicious liver lesions. 3. Two small indeterminate T2 hypointense renal cortical lesions, largest 1.0 cm in the medial interpolar right kidney, for which assessment for enhancement is nondiagnostic due to substantial motion degradation. Follow-up MRI abdomen without and with IV contrast recommended in 6 months. 4. Cholelithiasis and mild gallbladder sludge. Moderate diffuse gallbladder wall thickening, nonspecific, without significant gallbladder distention. No biliary ductal dilatation. No convincing choledocholithiasis on the motion degraded sequences. 5. Small dependent right pleural effusion with dependent right lung base atelectasis, unchanged from recent CT. 6. Trace perihepatic ascites. Electronically Signed   By: Ilona Sorrel M.D.   On: 06/17/2021 09:49   DG Chest Port 1 View  Result Date: 06/02/2021 CLINICAL DATA:  Pt presents with constipation. No bm for a week, lots of rectal pain/pressure. Pt was hospitalized with heart attack in December and just dc recently due to kidney damage. Is new dialysis. EXAM: PORTABLE CHEST 1 VIEW COMPARISON:  05/15/2021. FINDINGS: Cardiac silhouette is normal in size. No mediastinal or hilar masses. Clear lungs.  No convincing pleural effusion and no pneumothorax. Right internal jugular tunneled dual lumen central venous catheter is new, tip projects in the lower superior vena cava. Skeletal structures are  grossly intact. IMPRESSION: No acute cardiopulmonary disease. Electronically Signed   By: Lajean Manes M.D.   On: 06/02/2021 10:25   DG Swallowing Func-Speech Pathology  Result Date: 06/10/2021 Table formatting from the original result was not included. Objective Swallowing Evaluation: Type of Study: MBS-Modified Barium Swallow Study  Patient Details Name: Ferguson Gertner MRN: [id number] Date of Birth: 1946-01-28 Today's Date: 06/10/2021 Time: SLP Start Time (ACUTE ONLY): 1053 -SLP Stop Time (ACUTE ONLY): 5361 SLP Time Calculation (min) (ACUTE ONLY): 16 min Past Medical History: Past Medical History: Diagnosis Date  Arthritis   Chronic kidney disease, stage 3b ([place])   ESRD (end stage renal disease) ([place])   ETOH abuse   History of stress test 09/02/2008  showed inferolateral scar without ischemia  Hx of echocardiogram 09/02/2008  was essentially normal  Hyperlipidemia   Hypertension   Myocardial infarction [facility])   Tobacco abuse  Past Surgical History: Past Surgical History: Procedure Laterality Date  AV FISTULA PLACEMENT Left 05/27/2021  Procedure: LEFT ARTERIOVENOUS (AV) GRAFT CREATION;  Surgeon: Broadus John, MD;  Location: [place];  Service: Vascular;  Laterality: Left;  PERIPHERAL NERVE BLOCK  BACK SURGERY    Dr Luiz Ochoa involving L3-L4 discectomy.  CARDIOVERSION N/A 05/13/2021  Procedure: CARDIOVERSION;  Surgeon: Larey Dresser, MD;  Location: [facility] ENDOSCOPY;  Service: Cardiovascular;  Laterality: N/A;  HERNIA REPAIR    IR FLUORO GUIDE CV LINE RIGHT  05/21/2021  IR US GUIDE VASC ACCESS RIGHT  05/21/2021  RIGHT/LEFT HEART CATH AND CORONARY ANGIOGRAPHY N/A 05/09/2021  Procedure: RIGHT/LEFT HEART CATH AND CORONARY ANGIOGRAPHY;  Surgeon: Larey Dresser, MD;  Location: [place] CV LAB;  Service: Cardiovascular;  Laterality: N/A;  SPINE SURGERY    TEE WITHOUT CARDIOVERSION N/A 05/07/2021  Procedure: TRANSESOPHAGEAL ECHOCARDIOGRAM (TEE);  Surgeon: Larey Dresser, MD;  Location[facility] ENDOSCOPY;  Service:  Cardiovascular;  Laterality: N/A;  TEE WITHOUT CARDIOVERSION N/A 05/13/2021  Procedure: TRANSESOPHAGEAL ECHOCARDIOGRAM (TEE);  Surgeon: Larey Dresser, MD;  Location: [facility] ENDOSCOPY;  Service: Cardiovascular;  Laterality: N/A; HPI: Pt is a 76 y.o. male who presented with complaints of rectal pain and difficulty swallowing for 2 week prior to admission. Difficulty swallowing described in H&P as "like food and liquid gets stuck in his  throat at times which causes him to vomit it back up." CXR on admission: Increasing bibasilar opacities which may represent atelectasis or airspace disease. GI consulted; MD recommended for esophagram as initial workup since pt's recent NSTEMI places him at high risk for endoscopic procedures; PA recommended MBS and esophagram if MBS is inconclusive. PMH: hypertension, hyperlipidemia, ESRD on HD, CAD, CHF, A. fib with RVR, prior alcohol abuse, NSTEMI, and tobacco abuse.  Subjective: pt nauseous  Recommendations for follow up therapy are one component of a multi-disciplinary discharge planning process, led by the attending physician.  Recommendations may be updated based on patient status, additional functional criteria and insurance authorization. Assessment / Plan / Recommendation Clinical Impressions 06/10/2021 Clinical Impression Pt's oropharyngeal swallowing is WFL across consistencies tested. He did have frequent gagging and dry heaving throughout testing, but no oropharyngeal difficulties that would account for his recent symptoms. From an oropharyngeal standpoint, he could advance to regular solids and thin liquids once medically able to do so. SLP to sign off acutely. SLP Visit Diagnosis Dysphagia, unspecified (R13.10) Attention and concentration deficit following -- Frontal lobe and executive function deficit following -- Impact on safety and function Risk for inadequate nutrition/hydration;Mild aspiration risk   Treatment Recommendations 06/10/2021 Treatment Recommendations No  treatment recommended at this time   Prognosis 06/10/2021 Prognosis for Safe Diet Advancement Good Barriers to Reach Goals -- Barriers/Prognosis Comment -- Diet Recommendations 06/10/2021 SLP Diet Recommendations Regular solids;Thin liquid Liquid Administration via Cup;Straw Medication Administration Whole meds with liquid Compensations Slow rate;Small sips/bites Postural Changes Seated upright at 90 degrees;Remain semi-upright after after feeds/meals (Comment)   Other Recommendations 06/10/2021 Recommended Consults -- Oral Care Recommendations Oral care BID Other Recommendations -- Follow Up Recommendations No SLP follow up Assistance recommended at discharge None Functional Status Assessment Patient has not had a recent decline in their functional status Frequency and Duration  06/09/2021 Speech Therapy Frequency (ACUTE ONLY) min 2x/week Treatment Duration 2 weeks   Oral Phase 06/10/2021 Oral Phase WFL Oral - Pudding Teaspoon -- Oral - Pudding Cup -- Oral - Honey Teaspoon -- Oral - Honey Cup -- Oral - Nectar Teaspoon -- Oral - Nectar Cup -- Oral - Nectar Straw -- Oral - Thin Teaspoon -- Oral - Thin Cup -- Oral - Thin Straw -- Oral - Puree -- Oral - Mech Soft -- Oral - Regular -- Oral - Multi-Consistency -- Oral - Pill -- Oral Phase - Comment --  Pharyngeal Phase 06/10/2021 Pharyngeal Phase WFL Pharyngeal- Pudding Teaspoon -- Pharyngeal -- Pharyngeal- Pudding Cup -- Pharyngeal -- Pharyngeal- Honey Teaspoon -- Pharyngeal -- Pharyngeal- Honey Cup -- Pharyngeal -- Pharyngeal- Nectar Teaspoon -- Pharyngeal -- Pharyngeal- Nectar Cup -- Pharyngeal -- Pharyngeal- Nectar Straw -- Pharyngeal -- Pharyngeal- Thin Teaspoon -- Pharyngeal -- Pharyngeal- Thin Cup -- Pharyngeal -- Pharyngeal- Thin Straw -- Pharyngeal -- Pharyngeal- Puree -- Pharyngeal -- Pharyngeal- Mechanical Soft -- Pharyngeal -- Pharyngeal- Regular -- Pharyngeal -- Pharyngeal- Multi-consistency -- Pharyngeal -- Pharyngeal- Pill -- Pharyngeal -- Pharyngeal Comment --   Cervical Esophageal Phase  06/10/2021 Cervical Esophageal Phase WFL Pudding Teaspoon -- Pudding Cup -- Honey Teaspoon -- Honey Cup -- Nectar Teaspoon -- Nectar Cup -- Nectar Straw -- Thin Teaspoon -- Thin Cup -- Thin Straw -- Puree -- Mechanical Soft -- Regular -- Multi-consistency -- Pill -- Cervical Esophageal Comment -- Osie Bond., M.A. [facility] Pager [phone] Office [phone] 06/10/2021, 12:54 PM  VAS [place] UPPER EXT VEIN MAPPING (PRE-OP AVF)  Result Date: 05/21/2021 UPPER EXTREMITY VEIN MAPPING Patient Name:  QUENTON RECENDEZ [facility]  Date of Exam:   05/21/2021 Medical Rec #: [id number]              Accession #:    [id number] Date of Birth: 1945-07-03              Patient Gender: M Patient Age:   76 years Exam Location:  [facility] Procedure:      VAS [place] UPPER EXT VEIN MAPPING (PRE-OP AVF) Referring Phys: Jannifer Hick --------------------------------------------------------------------------------  Indications: Pre-access. Comparison Study: no prior Performing Technologist: Archie Patten RVS  Examination Guidelines: A complete evaluation includes B-mode imaging, spectral Doppler, color Doppler, and power Doppler as needed of all accessible portions of each vessel. Bilateral testing is considered an integral part of a complete examination. Limited examinations for reoccurring indications may be performed as noted. +-----------------+-------------+----------+--------------+  Right Cephalic    Diameter (cm) Depth (cm)    Findings     +-----------------+-------------+----------+--------------+  Shoulder                                   not visualized  +-----------------+-------------+----------+--------------+  Prox upper arm                             not visualized  +-----------------+-------------+----------+--------------+  Mid upper arm                              not visualized  +-----------------+-------------+----------+--------------+  Dist  upper arm        0.13         0.31                    +-----------------+-------------+----------+--------------+  Antecubital fossa     0.18         0.24                    +-----------------+-------------+----------+--------------+  Prox forearm          0.28         0.34                    +-----------------+-------------+----------+--------------+  Mid forearm           0.23         0.20                    +-----------------+-------------+----------+--------------+  Dist forearm          0.21         0.14                    +-----------------+-------------+----------+--------------+  Wrist                 0.21         0.13                    +-----------------+-------------+----------+--------------+ +-----------------+-------------+----------+----------------------------+  Right Basilic     Diameter (cm) Depth (cm)           Findings            +-----------------+-------------+----------+----------------------------+  Prox upper arm  not visualized and picc line  +-----------------+-------------+----------+----------------------------+  Mid upper arm                              not visualized and picc line  +-----------------+-------------+----------+----------------------------+  Dist upper arm        0.29         0.59                                  +-----------------+-------------+----------+----------------------------+  Antecubital fossa     0.24         0.45                                  +-----------------+-------------+----------+----------------------------+  Prox forearm          0.16         0.16                                  +-----------------+-------------+----------+----------------------------+  Mid forearm           0.15         0.18                                  +-----------------+-------------+----------+----------------------------+  Distal forearm                                    not visualized          +-----------------+-------------+----------+----------------------------+ +-----------------+-------------+----------+--------+  Left Cephalic     Diameter (cm) Depth (cm) Findings  +-----------------+-------------+----------+--------+  Shoulder              0.33         0.76              +-----------------+-------------+----------+--------+  Prox upper arm        0.23         0.37              +-----------------+-------------+----------+--------+  Mid upper arm         0.22         0.31              +-----------------+-------------+----------+--------+  Dist upper arm        0.22         0.48              +-----------------+-------------+----------+--------+  Antecubital fossa     0.31         0.18              +-----------------+-------------+----------+--------+  Prox forearm          0.22         0.22              +-----------------+-------------+----------+--------+  Mid forearm           0.21         0.23              +-----------------+-------------+----------+--------+  Dist forearm          0.21  0.13              +-----------------+-------------+----------+--------+ +-----------------+-------------+----------+---------+  Left Basilic      Diameter (cm) Depth (cm) Findings   +-----------------+-------------+----------+---------+  Prox upper arm        0.31         0.52               +-----------------+-------------+----------+---------+  Mid upper arm         0.21         0.18    branching  +-----------------+-------------+----------+---------+  Dist upper arm        0.25         0.18               +-----------------+-------------+----------+---------+  Antecubital fossa     0.24         0.26               +-----------------+-------------+----------+---------+  Prox forearm          0.27         0.13               +-----------------+-------------+----------+---------+  Mid forearm           0.20         0.11               +-----------------+-------------+----------+---------+  Distal forearm         0.23         0.14               +-----------------+-------------+----------+---------+ *See table(s) above for measurements and observations.  Diagnosing physician: Deitra Mayo MD Electronically signed by Deitra Mayo MD on 05/21/2021 at 3:22:50 PM.    Final    US Abdomen Limited RUQ (LIVER/GB)  Result Date: 06/08/2021 CLINICAL DATA:  Abdominal pain EXAM: ULTRASOUND ABDOMEN LIMITED RIGHT UPPER QUADRANT COMPARISON:  CT abdomen dated 06/07/2021. FINDINGS: Gallbladder: Gallbladder wall is thickened, with demonstrated measurement of 5 mm. Incidental note made of 3 mm and 4 mm polyps adherent to the gallbladder wall. 5 mm stone is seen within the gallbladder. Subtle sludge is seen within the gallbladder. No sonographic Murphy's sign elicited. Common bile duct: Diameter: 2 mm Liver: No focal lesion identified. Within normal limits in echogenicity. Portal vein is patent on color Doppler imaging with normal direction of blood flow towards the liver. Other: None. IMPRESSION: 1. Gallbladder wall is thickened, however, no sonographic Murphy's sign was elicited during the exam to confirm an acute cholecystitis. Differential includes cholecystitis (acute and chronic), CHF, hepatic cirrhosis or hepatitis, and hypoalbuminemia. Consider nuclear medicine HIDA scan for further characterization. 2. Sludge and 5 mm stone within the gallbladder. 3. No focal liver lesion is seen by ultrasound. Multiple indeterminate hypodense lesions were seen in the liver on CT abdomen of 06/07/2021. Recommend further characterization with nonemergent liver MRI. Electronically Signed   By: Franki Cabot M.D.   On: 06/08/2021 14:07     Subjective: - no chest pain, shortness of breath, no abdominal pain, nausea or vomiting.   Discharge Exam: BP (!) 99/56 (BP Location: Right Arm)    Pulse 72    Temp 97.9 F (36.6 C) (Oral)    Resp 18    Ht 5' 11"  (1.803 m)    Wt 69 kg    SpO2 100%    BMI 21.22 kg/m   General: Pt is alert, awake,  not in acute distress Cardiovascular: RRR, S1/S2 +, no  rubs, no gallops Respiratory: CTA bilaterally, no wheezing, no rhonchi Abdominal: Soft, NT, ND, bowel sounds + Extremities: no edema, no cyanosis  The results of significant diagnostics from this hospitalization (including imaging, microbiology, ancillary and laboratory) are listed below for reference.     Microbiology: No results found for this or any previous visit (from the past 240 hour(s)).   Labs: Basic Metabolic Panel: Recent Labs  Lab 06/14/21 0041 06/15/21 0239 06/16/21 0106 06/17/21 0738 06/18/21 0209 06/19/21 0247  NA 134* 138 137 134* 135 133*  K 5.0 4.0 4.1 4.5 4.1 4.0  CL 95* 97* 98 97* 96* 95*  CO2 22 23 24  21* 24 24  GLUCOSE 118* 77 107* 153* 112* 107*  BUN 64* 38* 56* 83* 44* 66*  CREATININE 7.18* 5.14* 6.93* 8.67* 5.61* 7.25*  CALCIUM 9.5 8.8* 9.2 9.1 8.2* 8.8*  PHOS 7.7*  --   --   --   --  6.5*   Liver Function Tests: Recent Labs  Lab 06/14/21 0041 06/15/21 0239 06/16/21 0106 06/17/21 0738 06/18/21 0209 06/19/21 0247  AST 374* 299* 208* 111* 83*  --   ALT 336* 301* 253* 193* 154*  --   ALKPHOS 227* 217* 190* 164* 138*  --   BILITOT 1.5* 1.6* 1.2 1.3* 1.4*  --   PROT 6.4* 6.3* 5.8* 6.3* 6.0*  --   ALBUMIN 3.2*   3.1* 3.2* 3.0* 3.1* 3.0* 3.0*   CBC: Recent Labs  Lab 06/16/21 0106 06/17/21 0738 06/18/21 0209 06/19/21 0247 06/20/21 0121  WBC 7.1 6.6 6.6 7.3 6.8  HGB 9.7* 10.0* 10.1* 9.9* 9.9*  HCT 29.1* 29.9* 30.1* 30.9* 29.8*  MCV 99.0 97.7 98.4 98.7 99.3  PLT 156 150 152 142* 145*   CBG: Recent Labs  Lab 06/18/21 2343 06/19/21 0749 06/19/21 1237 06/19/21 1646 06/20/21 0817  GLUCAP 113* 94 69* 107* 108*   Hgb A1c No results for input(s): HGBA1C in the last 72 hours. Lipid Profile No results for input(s): CHOL, HDL, LDLCALC, TRIG, CHOLHDL, LDLDIRECT in the last 72 hours. Thyroid function studies No results for input(s): TSH, T4TOTAL, T3FREE, THYROIDAB in the last 72  hours.  Invalid input(s): FREET3 Urinalysis    Component Value Date/Time   COLORURINE YELLOW 05/14/2021 1842   APPEARANCEUR CLEAR 05/14/2021 1842   LABSPEC >1.030 (H) 05/14/2021 1842   PHURINE 5.5 05/14/2021 1842   GLUCOSEU NEGATIVE 05/14/2021 1842   HGBUR NEGATIVE 05/14/2021 1842   BILIRUBINUR NEGATIVE 05/14/2021 1842   BILIRUBINUR negative 01/23/2020 1028   KETONESUR NEGATIVE 05/14/2021 1842   PROTEINUR NEGATIVE 05/14/2021 1842   UROBILINOGEN 1.0 01/23/2020 1028   NITRITE NEGATIVE 05/14/2021 1842   LEUKOCYTESUR NEGATIVE 05/14/2021 1842    FURTHER DISCHARGE INSTRUCTIONS:   Get Medicines reviewed and adjusted: Please take all your medications with you for your next visit with your Primary MD   Laboratory/radiological data: Please request your Primary MD to go over all hospital tests and procedure/radiological results at the follow up, please ask your Primary MD to get all Hospital records sent to his/her office.   In some cases, they will be blood work, cultures and biopsy results pending at the time of your discharge. Please request that your primary care M.D. goes through all the records of your hospital data and follows up on these results.   Also Note the following: If you experience worsening of your admission symptoms, develop shortness of breath, life threatening emergency, suicidal or homicidal thoughts you must seek medical attention immediately by calling 911  or calling your MD immediately  if symptoms less severe.   You must read complete instructions/literature along with all the possible adverse reactions/side effects for all the Medicines you take and that have been prescribed to you. Take any new Medicines after you have completely understood and accpet all the possible adverse reactions/side effects.    Do not drive when taking Pain medications or sleeping medications (Benzodaizepines)   Do not take more than prescribed Pain, Sleep and Anxiety Medications. It is  not advisable to combine anxiety,sleep and pain medications without talking with your primary care practitioner   Special Instructions: If you have smoked or chewed Tobacco  in the last 2 yrs please stop smoking, stop any regular Alcohol  and or any Recreational drug use.   Wear Seat belts while driving.   Please note: You were cared for by a hospitalist during your hospital stay. Once you are discharged, your primary care physician will handle any further medical issues. Please note that NO REFILLS for any discharge medications will be authorized once you are discharged, as it is imperative that you return to your primary care physician (or establish a relationship with a primary care physician if you do not have one) for your post hospital discharge needs so that they can reassess your need for medications and monitor your lab values.  Time coordinating discharge: 40 minutes  SIGNED:  Marzetta Board, MD, [name] 06/20/2021, 11:22 AM

## 2021-06-20 NOTE — TOC Progression Note (Addendum)
Transition of Care [facility]) - Progression Note    Patient Details  Name: Ruben [name] MRN: [id number] Date of Birth: [birth year]  Transition of Care [facility]) CM/SW Contact  Dayanara Sherrill, Edson Snowball, RN Phone Number: 06/20/2021, 11:24 AM  Clinical Narrative:     Anderson Malta with [facility] aware discharge today      1405 discharge cancelled for today, Anderson Malta with [facility] updated . Asked MD to add HHSW to home health orders. Jennifer with [facility] aware and can provide HHSW   1500 PT recommending transport chair. Same ordered through [facility] with [place]   1528 Spoke to patient and daughter Leonia Reeves at bedside. [facility] set up for RN,PT,OT,SW. Ordered transport chair through [employer]. [employer] has Rollator and 3 in 1 at home. Patient also has home oxygen through [place]. Reba has his portable oxygen tank in her truck, however Reba states home concentrator is not working. NCM will call Freda Munro with [place] and ask [employer] to call Reba regarding transport chair and home oxygen concentrator . Reba aware. Freda Munro with [facility] aware.   Reba wanted NCM to call Amy with [facility] [phone]  regarding discharge plan. NCM spoke with Amy. Amy aware.  Expected Discharge Plan and Services           Expected Discharge Date: 06/20/21                                     Social Determinants of Health (SDOH) Interventions    Readmission Risk Interventions No flowsheet data found.

## 2021-06-20 NOTE — Progress Notes (Signed)
Paged back to discuss with family, apparently when patient was ambulating from the bathroom to his bed and got worn out really fast and dropped/fell to the bed. They are concerned that they will have to bring him right back to the hospital as they could not get him to his appointments / dialysis. I have contacted the SW/CM/PT/OT to assess whether he would truly benefit from SNF at this point. Will cancel the dc  Kalei Mckillop M. Cruzita Lederer, MD, [name] [facility]  Between 7 am - 7 pm you can contact me via Amion (for emergencies) or [place] (non urgent matters).  I am not available 7 pm - 7 am, please contact night coverage MD/APP via Amion

## 2021-06-20 NOTE — Progress Notes (Signed)
Occupational Therapy Treatment Patient Details Name: Ruben [name] MRN: [id number] DOB: 09-Apr-1946 Today's Date: 06/20/2021   History of present illness Pt is 76 y.o. male admitted 06/07/21 with ongoing c/o vomiting/rectal pain. Imaging (+) for bibasilar atelectasis, lesions in liver/kidneys, possible cholecystitis, possible AVN B femoral heads. S/p flexible sigmoidoscopy 2/10 revealing mucosal ulceration, proctitis. PMH includes arthritis, ESRD on dialysis, ETOH abuse, HTN, MI   OT comments  Spoke with family and patient about home safety, fall prevention and options for additional DME to decrease risk when pt fatigues. Discussed pros and cons of SNF for rehab vs home with [facility] therapies. Pt has excellent family support at home. They ultimately decided home with [facility] is the optimal d/c disposition as pt will be more comfortable in his own bed, wife can quickly get him pain meds, he prefers the food.    Recommendations for follow up therapy are one component of a multi-disciplinary discharge planning process, led by the attending physician.  Recommendations may be updated based on patient status, additional functional criteria and insurance authorization.    Follow Up Recommendations  Home health OT    Assistance Recommended at Discharge Frequent or constant Supervision/Assistance  Patient can return home with the following  A little help with walking and/or transfers;Help with stairs or ramp for entrance;Assistance with cooking/housework;A lot of help with bathing/dressing/bathroom   Equipment Recommendations  BSC/3in1;Tub/shower seat;Wheelchair (measurements OT);Wheelchair cushion (measurements OT)    Recommendations for Other Services      Precautions / Restrictions Precautions Precautions: Fall Precaution Comments: falls with fatigue       Mobility Bed Mobility                    Transfers                         Balance                                            ADL either performed or assessed with clinical judgement   ADL                                         General ADL Comments: Long conversation with wife, pt and daughter about pros and cons of home vs SNF. Educated in safety and fall prevention, placing chairs throughout home for him to sit as he fatigues, use of a w/c or travel chair in and out of HD, seated showering.    Extremity/Trunk Assessment              Vision       Perception     Praxis      Cognition Arousal/Alertness: Awake/alert Behavior During Therapy: Flat affect Overall Cognitive Status: Within Functional Limits for tasks assessed                                 General Comments: pt with difficulty monitoring his symptoms of fatigue        Exercises      Shoulder Instructions       General Comments      Pertinent Vitals/ Pain       Pain Assessment Pain Assessment:  Faces Faces Pain Scale: Hurts even more Pain Location: Rectal pain/abdominal pressure Pain Descriptors / Indicators: Discomfort, Pressure Pain Intervention(s): Premedicated before session  Home Living                                          Prior Functioning/Environment              Frequency  Min 2X/week        Progress Toward Goals  OT Goals(current goals can now be found in the care plan section)  Progress towards OT goals: Progressing toward goals  Acute Rehab OT Goals OT Goal Formulation: With patient Time For Goal Achievement: 06/29/21 Potential to Achieve Goals: Good  Plan Discharge plan remains appropriate;Frequency remains appropriate    Co-evaluation                 AM-PAC OT "6 Clicks" Daily Activity     Outcome Measure   Help from another person eating meals?: None Help from another person taking care of personal grooming?: A Little Help from another person toileting, which includes using toliet, bedpan, or  urinal?: A Little Help from another person bathing (including washing, rinsing, drying)?: A Little Help from another person to put on and taking off regular upper body clothing?: A Little Help from another person to put on and taking off regular lower body clothing?: A Little 6 Click Score: 19    End of Session Equipment Utilized During Treatment: Gait belt;Rolling walker (2 wheels);Oxygen  OT Visit Diagnosis: Unsteadiness on feet (R26.81);Other abnormalities of gait and mobility (R26.89);Muscle weakness (generalized) (M62.81);Pain   Activity Tolerance Patient limited by fatigue   Patient Left in bed;with call bell/phone within reach;with bed alarm set;with family/visitor present   Nurse Communication Other (comment) (daughter wants sores on heels evaluated)        Time: 8032-1224 OT Time Calculation (min): 22 min  Charges: OT General Charges $OT Visit: 1 Visit OT Treatments $Self Care/Home Management : 8-22 mins  Nestor Lewandowsky, OTR/L Acute Rehabilitation Services Pager: [phone] Office: [phone]  Malka So 06/20/2021, 3:43 PM

## 2021-06-20 NOTE — Consult Note (Incomplete)
Daughter and *** were at bedside this afternoon. He was ***. Patient stated that his "bottom hurts" and is able to identify the people at bedside. Stated that he is not sleeping well but has improved since starting the pm med. Patient and family reported that patient's appetite has minimally improved. Patient reported that  "Sometimes I have the anxiety, and I don't know if that pays a part". Reported that his mind is more at ease.  2023, feb, valentine,  Does not know the day Attention is intact Dizziness with standing Patient denied SI/HI/AVH. Patient was not grossly responding to internal/external stimuli nor made any delusional statements during encounter.

## 2021-06-20 NOTE — Progress Notes (Signed)
[facility] Progress Note   Subjective:   Patient seen and examined at bedside.  Tolerating breakfast so far.  Pain a little better today, ranging from 3-6.  Denies CP, SOB, abdominal pain and n/v/d.  Objective Vitals:   06/19/21 1237 06/19/21 2006 06/20/21 0607 06/20/21 0815  BP: 111/66 (!) 92/51 (!) 96/55 (!) 99/56  Pulse: 78 73 72 72  Resp: 17 19 17 18   Temp: 98 F (36.7 C) 99.4 F (37.4 C) 98 F (36.7 C) 97.9 F (36.6 C)  TempSrc:  Oral Oral Oral  SpO2: 100% 100% 100% 100%  Weight:      Height:       Physical Exam General:chronically ill appearing male in NAD Heart:RRR, no mrg Lungs:CTAB, nml WOB on 3L via [place] Abdomen:soft, NTND Extremities:no LE edema Dialysis Access: LU AVF maturing, [facility]   Filed Weights   06/19/21 0348 06/19/21 0838 06/19/21 1209  Weight: 69.4 kg 70.4 kg 69 kg    Intake/Output Summary (Last 24 hours) at 06/20/2021 0958 Last data filed at 06/19/2021 1832 Gross per 24 hour  Intake 480 ml  Output 1500 ml  Net -1020 ml    Additional Objective Labs: Basic Metabolic Panel: Recent Labs  Lab 06/14/21 0041 06/15/21 0239 06/17/21 0738 06/18/21 0209 06/19/21 0247  NA 134*   < > 134* 135 133*  K 5.0   < > 4.5 4.1 4.0  CL 95*   < > 97* 96* 95*  CO2 22   < > 21* 24 24  GLUCOSE 118*   < > 153* 112* 107*  BUN 64*   < > 83* 44* 66*  CREATININE 7.18*   < > 8.67* 5.61* 7.25*  CALCIUM 9.5   < > 9.1 8.2* 8.8*  PHOS 7.7*  --   --   --  6.5*   < > = values in this interval not displayed.   Liver Function Tests: Recent Labs  Lab 06/16/21 0106 06/17/21 0738 06/18/21 0209 06/19/21 0247  AST 208* 111* 83*  --   ALT 253* 193* 154*  --   ALKPHOS 190* 164* 138*  --   BILITOT 1.2 1.3* 1.4*  --   PROT 5.8* 6.3* 6.0*  --   ALBUMIN 3.0* 3.1* 3.0* 3.0*    CBC: Recent Labs  Lab 06/16/21 0106 06/17/21 0738 06/18/21 0209 06/19/21 0247 06/20/21 0121  WBC 7.1 6.6 6.6 7.3 6.8  HGB 9.7* 10.0* 10.1* 9.9* 9.9*  HCT 29.1* 29.9* 30.1*  30.9* 29.8*  MCV 99.0 97.7 98.4 98.7 99.3  PLT 156 150 152 142* 145*   CBG: Recent Labs  Lab 06/18/21 2343 06/19/21 0749 06/19/21 1237 06/19/21 1646 06/20/21 0817  GLUCAP 113* 94 69* 107* 108*    Medications:  promethazine (PHENERGAN) injection (IM or IVPB) Stopped (06/13/21 2334)    (feeding supplement) PROSource Plus  30 mL Oral TID BM   apixaban  5 mg Oral BID   bisacodyl  10 mg Rectal Daily   Chlorhexidine Gluconate Cloth  6 each Topical Q0600   Chlorhexidine Gluconate Cloth  6 each Topical Q0600   darbepoetin (ARANESP) injection - DIALYSIS  60 mcg Intravenous Q Mon-HD   diltiazem   Topical BID   ferric citrate  420 mg Oral TID WC   gabapentin  100 mg Oral BID   hydrocortisone  25 mg Rectal BID   hydrOXYzine  10 mg Oral Q M,W,F-1800   magnesium oxide  200 mg Oral Daily   midodrine  10 mg Oral  TID WC   midodrine  10 mg Oral Q M,W,F-HD   mirtazapine  7.5 mg Oral QHS   multivitamin  1 tablet Oral QHS   polyethylene glycol  17 g Oral BID   senna-docusate  2 tablet Oral BID   sodium chloride flush  3 mL Intravenous Q12H    Dialysis Orders: MWF at [facility] 4hr, 400/500, EDW 72kg (although leaving 74kg range), 2K/2Ca, TDC (+ maturing AVF), heparin 2000 unit bolus - No ESA or VDRA   Assessment/Plan: Proctitis/ rectal ulceration/ fecal impaction: On aggressive bowel regimen.  Disimpacted by GI w/large amount of stool removed. Having good BM but continued rectal pain.  GI following - FFS on 2/10 rectal ulceration/proctitis, biopsies taken.  Liver/kidney lesions: Elevated LFTs. MRI showed benign liver cysts stable since 2019, no suspicious liver lesions, 2 small indeterminate T2 hypointense renal cortical lesions.  Dysphagia: Swallow studies normal.  ESRD:  MWF.  Next HD tomorrow. Hypotension/volume: Chronically low BP, on midodrine 10mg  TID. Ordered prn midodrine to be given pre HD. Continue UF as BP allows. Under EDW, will need to be lowered on d/c ~69kg  Anemia: Hgb 9.9 -  FOBT negative. Tsat 24%, IV iron ordered. Aranesp 60 q Monday.  Metabolic bone disease: CorrCa and Phos high.  Increase Auryxia to 2 AC TID.  CAD/HFrEF - missed outpatient cardiology follow up.   Will need to reschedule after d/c unless need to be seen as inpatient. A-fib: On amiodarone. Eliquis on hold. Heparin per pharmacy.  Nutrition - On regular diet. RD concerned for poor intake. Prot supp for low albumin.  Follow labs.  Depression/anxiety - psych consult.  Not a danger to himself.   Jen Mow, PA-C [facility] 06/20/2021,9:58 AM  LOS: 12 days

## 2021-06-20 NOTE — TOC Benefit Eligibility Note (Signed)
Patient Advocate Encounter  Prior Authorization for Auryxia 1 gm 210 mg tablet has been approved.    PA# [id number] Effective dates: 06/20/2021 through 05/04/2022  Patients co-pay is $462.80.     Lyndel Safe, [place] Patient Advocate Specialist [place] Patient Advocate Team Direct Number: [phone]  Fax: [phone]

## 2021-06-20 NOTE — TOC Benefit Eligibility Note (Signed)
Patient Teacher, English as a foreign language completed.    The patient is currently admitted and upon discharge could be taking sevelamer (Renvela) 800 mg tablet.  The current 30 day co-pay is, $92.01.   The patient is insured through [place], [place] Patient Advocate Specialist [place] Patient Advocate Team Direct Number: [phone]  Fax: [phone]

## 2021-06-20 NOTE — TOC Benefit Eligibility Note (Signed)
Patient Teacher, English as a foreign language completed.    The patient is currently admitted and upon discharge could be taking Auryxia 1 gm 210 mg tablet.  Prior Authorization Required  The patient is insured through [place], [place] Patient Advocate Specialist [place] Patient Advocate Team Direct Number: [phone]  Fax: [phone]

## 2021-06-20 NOTE — Progress Notes (Signed)
Physical Therapy Treatment Patient Details Name: Ruben [name] MRN: [id number] DOB: 07/25/1945 Today's Date: 06/20/2021   History of Present Illness Pt is 76 y.o. male admitted 06/07/21 with ongoing c/o vomiting/rectal pain. Imaging (+) for bibasilar atelectasis, lesions in liver/kidneys, possible cholecystitis, possible AVN B femoral heads. S/p flexible sigmoidoscopy 2/10 revealing mucosal ulceration, proctitis. PMH includes arthritis, ESRD on dialysis, ETOH abuse, HTN, MI    PT Comments    Pt received in supine, c/o rectal pain and fatigue but agreeable to therapy session with encouragement. Pt requiring increased time to initiate and perform mobility tasks and needs up to min guard for safety with RW use. Pt performed greater than household distance gait task with mostly Supervision however needs frequent cues for posture within RW and safe hand placement to stand/sit. RW adjusted for proper fit and gait belt given to family along with IS. Pt pulling ~2110mL on IS, therapist encouraged him to use consistently for pulmonary endurance building, pt/family receptive to all instruction. Pt agreeable to sit up in chair at end of session, still c/o rectal discomfort (did have suppository but used bathroom right after it was inserted). Pt continues to benefit from PT services to progress toward functional mobility goals, anticipate he will be safe to DC home with PRN family assistance and HHPT services once medically cleared.    Recommendations for follow up therapy are one component of a multi-disciplinary discharge planning process, led by the attending physician.  Recommendations may be updated based on patient status, additional functional criteria and insurance authorization.  Follow Up Recommendations  Home health PT     Assistance Recommended at Discharge Intermittent Supervision/Assistance  Patient can return home with the following A little help with walking and/or transfers;A little help  with bathing/dressing/bathroom;Assistance with cooking/housework;Help with stairs or ramp for entrance   Equipment Recommendations  Other (comment);None recommended by PT (pt family reports he owns rollator and RW; obtained gait belt for pt/family)    Recommendations for Other Services       Precautions / Restrictions Precautions Precautions: Fall Precaution Comments: dizziness Restrictions Weight Bearing Restrictions: No     Mobility  Bed Mobility Overal bed mobility: Modified Independent Bed Mobility: Supine to Sit           General bed mobility comments: increased time    Transfers Overall transfer level: Needs assistance Equipment used: Rolling walker (2 wheels) Transfers: Sit to/from Stand Sit to Stand: Supervision           General transfer comment: reliant on momentum to power into standing, cues for safe hand placement needed, fair eccentric control to sit    Ambulation/Gait Ambulation/Gait assistance: Min guard, Supervision Gait Distance (Feet): 250 Feet Assistive device: Rolling walker (2 wheels) Gait Pattern/deviations: Step-through pattern, Decreased stride length, Trunk flexed Gait velocity: Decreased     General Gait Details: Slow, steady, fatigued gait with RW and intial min guard for balance, progressing to supervision; 2x standing rest breaks secondary to fatigue; mod cues for RW proximity and forward gaze/upright posture      Balance Overall balance assessment: Needs assistance Sitting-balance support: Feet supported, No upper extremity supported Sitting balance-[name] Scale: Good Sitting balance - Comments: reaches outside BOS does not lose balance   Standing balance support: Bilateral upper extremity supported, During functional activity, No upper extremity supported Standing balance-[name] Scale: Fair (with RW) Standing balance comment: able to statically stand with U UE support, reliant on BUE support of RW for gait  Cognition Arousal/Alertness: Awake/alert Behavior During Therapy: Flat affect Overall Cognitive Status: Within Functional Limits for tasks assessed                                 General Comments: slow processing at times, decreased recall of postural cues        Exercises General Exercises - Lower Extremity Ankle Circles/Pumps: AROM, Both, 10 reps Long Arc Quad: Both, 10 reps, AROM, Seated Hip Flexion/Marching: AROM, Both, 10 reps Other Exercises Other Exercises: IS x 10 reps, varying 100-300 mL, encouraged use every 1-2 hours    General Comments General comments (skin integrity, edema, etc.): SpO2 95% on 2L O2 [place], 79 HR at rest to 101 bpm with exertion; BP 101/56 seated EOB and SBP 120's standing      Pertinent Vitals/Pain Pain Assessment Pain Assessment: Faces Faces Pain Scale: Hurts even more Pain Location: Rectal pain/abdominal pressure Pain Descriptors / Indicators: Discomfort, Pressure Pain Intervention(s): Limited activity within patient's tolerance, Monitored during session, Premedicated before session, Repositioned    Home Living                          Prior Function            PT Goals (current goals can now be found in the care plan section) Acute Rehab PT Goals Patient Stated Goal: Pt's goal is to decrease pain PT Goal Formulation: With patient Time For Goal Achievement: 06/29/21 Progress towards PT goals: Progressing toward goals    Frequency    Min 3X/week      PT Plan Current plan remains appropriate    Co-evaluation              AM-PAC PT "6 Clicks" Mobility   Outcome Measure  Help needed turning from your back to your side while in a flat bed without using bedrails?: None Help needed moving from lying on your back to sitting on the side of a flat bed without using bedrails?: None Help needed moving to and from a bed to a chair (including a wheelchair)?: A Little Help needed standing  up from a chair using your arms (e.g., wheelchair or bedside chair)?: A Little Help needed to walk in hospital room?: A Little Help needed climbing 3-5 steps with a railing? : A Lot (anticipate mod cues) 6 Click Score: 19    End of Session Equipment Utilized During Treatment: Gait belt;Oxygen Activity Tolerance: Patient tolerated treatment well Patient left: in chair;with call bell/phone within reach;with family/visitor present (family x2 in room) Nurse Communication: Mobility status PT Visit Diagnosis: Unsteadiness on feet (R26.81);Other abnormalities of gait and mobility (R26.89);Pain     Time: 2956-2130 PT Time Calculation (min) (ACUTE ONLY): 43 min  Charges:  $Gait Training: 8-22 mins $Therapeutic Exercise: 8-22 mins $Therapeutic Activity: 8-22 mins                     Said Rueb P., PTA Acute Rehabilitation Services Pager: [phone] Office: [place] 06/20/2021, 11:35 AM

## 2021-06-20 NOTE — TOC Benefit Eligibility Note (Signed)
Patient Advocate Encounter   Received notification that prior authorization for Auryxia 1 gm 210 mg (FE) Tablets is required.   PA submitted on 06/20/2021 Key [id number] Status is pending       Lyndel Safe, CPhT Pharmacy Patient Advocate Specialist [place] Patient Advocate Team Direct Number: [phone]  Fax: [phone]

## 2021-06-20 NOTE — Consult Note (Signed)
Brief Psychiatry Consult Note  The patient was last seen by the psychiatry service on 2/14. Interim documentation by primary team and nursing staff has been reviewed. At this time, patient appeared less anxious (much less ruminative on granddaughter). Per pt family he has been sleeping much better but more sedated during the day. They note some improvement in appetite. Patient endorses some lightheadedness/dizziness ongoing for a few weeks and not worse since mirtazapine initiation. Denies SI, HI, AH/VH.    Please see last consult note for full assessment. Final medication recommendations are as follows:  - reduce mirtazapine to 3.75 (increased sedation) - c hydroxyzine MWF prior to dialysis  Will continue to follow, likely sign off after next visit.   Nefertari Rebman A Season [place]

## 2021-06-21 ENCOUNTER — Inpatient Hospital Stay (HOSPITAL_COMMUNITY): Payer: Medicare Other

## 2021-06-21 ENCOUNTER — Telehealth: Payer: Self-pay

## 2021-06-21 LAB — RENAL FUNCTION PANEL
Albumin: 3 g/dL — ABNORMAL LOW (ref 3.5–5.0)
Anion gap: 14 (ref 5–15)
BUN: 71 mg/dL — ABNORMAL HIGH (ref 8–23)
CO2: 26 mmol/L (ref 22–32)
Calcium: 8.6 mg/dL — ABNORMAL LOW (ref 8.9–10.3)
Chloride: 92 mmol/L — ABNORMAL LOW (ref 98–111)
Creatinine, Ser: 7.63 mg/dL — ABNORMAL HIGH (ref 0.61–1.24)
GFR, Estimated: 7 mL/min — ABNORMAL LOW (ref >60)
Glucose, Bld: 115 mg/dL — ABNORMAL HIGH (ref 70–99)
Phosphorus: 6 mg/dL — ABNORMAL HIGH (ref 2.5–4.6)
Potassium: 4.3 mmol/L (ref 3.5–5.1)
Sodium: 132 mmol/L — ABNORMAL LOW (ref 135–145)

## 2021-06-21 LAB — HEPATIC FUNCTION PANEL
ALT: 73 U/L — ABNORMAL HIGH (ref 0–44)
AST: 35 U/L (ref 15–41)
Albumin: 3 g/dL — ABNORMAL LOW (ref 3.5–5.0)
Alkaline Phosphatase: 107 U/L (ref 38–126)
Bilirubin, Direct: 0.4 mg/dL — ABNORMAL HIGH (ref 0.0–0.2)
Indirect Bilirubin: 0.7 mg/dL (ref 0.3–0.9)
Total Bilirubin: 1.1 mg/dL (ref 0.3–1.2)
Total Protein: 6.1 g/dL — ABNORMAL LOW (ref 6.5–8.1)

## 2021-06-21 LAB — GLUCOSE, CAPILLARY
Glucose-Capillary: 107 mg/dL — ABNORMAL HIGH (ref 70–99)
Glucose-Capillary: 121 mg/dL — ABNORMAL HIGH (ref 70–99)
Glucose-Capillary: 123 mg/dL — ABNORMAL HIGH (ref 70–99)

## 2021-06-21 LAB — CBC
HCT: 31.2 % — ABNORMAL LOW (ref 39.0–52.0)
Hemoglobin: 10.2 g/dL — ABNORMAL LOW (ref 13.0–17.0)
MCH: 32.5 pg (ref 26.0–34.0)
MCHC: 32.7 g/dL (ref 30.0–36.0)
MCV: 99.4 fL (ref 80.0–100.0)
Platelets: 152 10*3/uL (ref 150–400)
RBC: 3.14 MIL/uL — ABNORMAL LOW (ref 4.22–5.81)
RDW: 19.4 % — ABNORMAL HIGH (ref 11.5–15.5)
WBC: 8.2 10*3/uL (ref 4.0–10.5)
nRBC: 0 % (ref 0.0–0.2)

## 2021-06-21 LAB — TROPONIN I (HIGH SENSITIVITY)
Troponin I (High Sensitivity): 68 ng/L — ABNORMAL HIGH (ref <18)
Troponin I (High Sensitivity): 65 ng/L — ABNORMAL HIGH (ref <18)

## 2021-06-21 MED ORDER — PENTAFLUOROPROP-TETRAFLUOROETH EX AERO: 1.0000 "application " | INHALATION_SPRAY | CUTANEOUS | Status: DC | PRN

## 2021-06-21 MED ORDER — SODIUM CHLORIDE 0.9 % IV SOLN: 100.0000 mL | INTRAVENOUS | Status: DC | PRN

## 2021-06-21 MED ORDER — HEPARIN SODIUM (PORCINE) 1000 UNIT/ML DIALYSIS: 1000.0000 [IU] | INTRAMUSCULAR | Status: DC | PRN

## 2021-06-21 MED ORDER — ALTEPLASE 2 MG IJ SOLR: 2.0000 mg | Freq: Once | INTRAMUSCULAR | Status: DC | PRN

## 2021-06-21 MED ORDER — LIDOCAINE HCL (PF) 1 % IJ SOLN: 5.0000 mL | INTRAMUSCULAR | Status: DC | PRN

## 2021-06-21 MED ORDER — LIDOCAINE-PRILOCAINE 2.5-2.5 % EX CREA: 1.0000 "application " | TOPICAL_CREAM | CUTANEOUS | Status: DC | PRN

## 2021-06-21 NOTE — Progress Notes (Signed)
Nutrition Follow-up  DOCUMENTATION CODES:  Severe malnutrition in context of chronic illness  INTERVENTION:  Continue regular diet as ordered, encourage PO intake Renavite daily Prosource Plus TID, 100kcal and 15g of protein per packet Magic cup TID with meals, each supplement provides 290 kcal and 9 grams of protein Continue to educate and discuss feeding tube with patient  NUTRITION DIAGNOSIS:  Severe Malnutrition (in the context of chronic illness) related to poor appetite as evidenced by severe fat depletion, severe muscle depletion, percent weight loss (13.4% x 4 months). - ongoing  GOAL:  Patient will meet greater than or equal to 90% of their needs - supplements in place  MONITOR:  PO intake, Diet advancement, Labs  REASON FOR ASSESSMENT:  Consult Assessment of nutrition requirement/status  ASSESSMENT:  76 y.o. male with history of CAD, HTN, HLD, EtOH abuse, CKD3, hx MI, and ESRD on HD who presented to ED with concern for rectal pain, nausea, vomiting, and worsening abdominal pain for several weeks.  2/10 - Flexible sigmoidoscopy   Pt resting in bed at the time of assessment, multiple family members present at bedside. Pt looks much more lethargic and uncomfortable than at last assessment. Pt was set to be discharged yesterday but was quite weak when getting out of bed and discharged was cancelled. Family reports that pt is having difficulty breathing, hopeful that pt will be taken down to HD soon.  Reviewed intake, family reports that pt was eating better over the last two days, but since he is unable to breathe today, he has refused both meals. Continues to consistently consume the prosource plus supplements.   Discussed concerns with nutrition with MD and brought up the idea that pt would benefit from a feeding tube as he is inconsistent with his intake. MD plans to dc patient today. Recently started pt on remeron which will hopefully aid in appetite. If appetite  stimulant does not seem to improve appetite, g-tube could be considered as an outpatient intervention.   Noted that pt has continued to lose weight this admission.   Admit weight: 78.9 kg Current weight: 69 kg Net IO Since Admission: -46.05 mL [06/21/21 0946]  Average Meal Intake: 2/8-2/10: 0% intake x 3 recorded meals 2/11-2/14: 76% intake x 4 recorded meals 2/15-2/17: 40% intake x 2 recorded meals  Nutritionally Relevant Medications: Scheduled Meds:  (feeding supplement) PROSource Plus  30 mL Oral TID BM   bisacodyl  10 mg Rectal Daily   ferric citrate  420 mg Oral TID WC   magnesium oxide  200 mg Oral Daily   mirtazapine  3.75 mg Oral QHS   multivitamin  1 tablet Oral QHS   polyethylene glycol  17 g Oral BID   senna-docusate  2 tablet Oral BID   PRN Meds: ondansetron, promethazine   Labs Reviewed: Labs drawn 2/15 Sodium 133, chloride 95 BUN 66, creatinine 7.25 Phosphorus 6.5 SBG ranges from 108-159 mg/dL over the last 24 hours Lab Results  Component Value Date   ALT 154 (H) 06/18/2021   AST 83 (H) 06/18/2021   ALKPHOS 138 (H) 06/18/2021   BILITOT 1.4 (H) 06/18/2021    NUTRITION - FOCUSED PHYSICAL EXAM: Flowsheet Row Most Recent Value  Orbital Region Moderate depletion  Upper Arm Region Severe depletion  Thoracic and Lumbar Region Severe depletion  Buccal Region Moderate depletion  Temple Region Moderate depletion  Clavicle Bone Region Moderate depletion  Clavicle and Acromion Bone Region Moderate depletion  Scapular Bone Region Moderate depletion  Dorsal Hand Severe  depletion  Patellar Region Severe depletion  Anterior Thigh Region Severe depletion  Posterior Calf Region Severe depletion  Edema (RD Assessment) None  Hair Reviewed  Eyes Reviewed  Skin Reviewed  Nails Reviewed   Diet Order:   Diet Order             Diet regular Room service appropriate? Yes; Fluid consistency: Thin; Fluid restriction: 1200 mL Fluid  Diet effective now                    EDUCATION NEEDS:  Education needs have been addressed  Skin:  Skin Assessment: Reviewed RN Assessment  Last BM:  2/16 - type 6  Height:  Ht Readings from Last 1 Encounters:  06/07/21 5\' 11"  (1.803 m)    Weight:  Wt Readings from Last 1 Encounters:  06/19/21 69 kg   Ideal Body Weight:  78.2 kg  BMI:  Body mass index is 21.22 kg/m.  Estimated Nutritional Needs:  Kcal:  2100-2300 kcal/d Protein:  100-110 g/d Fluid:  1L+UOP   Ranell Patrick, RD, LDN Clinical Dietitian RD pager # available in AMION  After hours/weekend pager # available in [facility]

## 2021-06-21 NOTE — Progress Notes (Signed)
PROGRESS NOTE  Ruben [name] Ruben [name] DOB: 05-12-1945 DOA: 06/07/2021 PCP: [name], FNP   LOS: 13 days   Brief Narrative / Interim history:  76 y.o. male with medical history significant of hypertension, hyperlipidemia, ESRD on HD, CAD, recent hospitalization for non-STEMI and acute systolic CHF with cardiogenic shock requiring ICU stay and inotrope support, A. fib with RVR, prior alcohol abuse, and tobacco abuse who presents with complaints of rectal pain and difficulty swallowing over the last 2 weeks.  His most recent hospital stay was prolonged, he was here for almost a month.  He has declined since with weight loss, poor p.o. intake, dysphagia as well as increased constipation.  There was concern for acute cholecystitis on admission and general surgery was consulted.  Initial imaging showed some nonspecific liver lesions, on a noncontrast study, and also patient complained of dysphagia for which GI was consulted.  Subjective / 24h Interval events: Patient seen this morning, complains of shortness of breath.  States that he was okay when he woke up around 9 AM he started experiencing more dyspnea.  Assessment and Plan: Principal problem Rectal pain and constipation- (present on admission) -patient reports complaints of constipation and rectal pain for which she has been unable to have adequate bowel movement despite taking stool softeners, sitz bath's, use of Preparation H, and suppositories. CT scan of the abdomen pelvis did not note any signs of fecal impaction or bowel obstruction, but appears to show stool throughout the colon.  GI consulted, status post manual disimpaction.  Continue aggressive bowel regimen, significantly improved, continues to have good bowel movements.  Remains with severe rectal pain at times.  Daughter tells me over the phone that she asked for IV Dilaudid overnight but he never received it.  Feels like tramadol does not work.  Switch to oxycodone,  understanding the risk of him becoming constipated.  Continue aggressive bowel regimen   Active problems Chronic hypoxic respiratory failure-patient on chronic 2 L nasal cannula, a little bit more short of breath this morning, has some evidence of crackles and he is predialysis.  Evaluated before his HD as well as after, while his wheezing is improved he still complains of shortness of breath.  Denies any chest pain.  Obtain a chest x-ray, given cardiac history obtain an EKG, cycle cardiac enzymes as his dyspnea appeared rather sudden  Chronic systolic CHF (congestive heart failure) ([place])- (present on admission) -Following NSTEMI in January 2023 patient was noted to have gone into cardiogenic shock and required temporary stay in ICU requiring pressors which were able to be weaned off and he was transition to midodrine.  EF 35-40% on echocardiogram with moderate mitral regurgitation. Continue midodrine, fluid management per dialysis   Liver lesions, elevated LFTs - CT scan on admission showed low-attenuation lesions measuring about 12 mm diameter in the liver.  Further imaging with CT scan with IV contrast as well as MRI showing fairly benign lesions   CAD (coronary artery disease)- (present on admission) -Patient had recent NSTEMI 04/2021 for which he underwent cardiac catheterization noting RCA occlusion with diffuse disease of the LAD and left circumflex.  It was not amendable to PCI and he was not a surgical candidate for CABG. Continue statin   Paroxysmal atrial fibrillation ([place]) -Patient appears to be in sinus rhythm at this time and rate controlled.  He was initially maintained on heparin, switch to Eliquis today   ESRD on hemodialysis [facility]) -He has new end-stage renal disease following his most recent  hospital stay.  He was discharged January 2023.  Currently MWF.  Just had an AV fistula placed, it has not matured and currently having dialysis via HD cath.   Anxiety and Depression -  Psychiatry consulted, started on Remeron 7.5 mg nightly for depression, insomnia, poor appetite.  He was also started on hydroxyzine 10 mg to use before dialysis for anxiety   Transaminitis- (present on admission) -AST was noted to be mildly elevated at 55, but lipase was noted to be within normal limits.  Possibly related with patient's prior history of abuse.   Normocytic anemia- (present on admission) -Stable.  No bleeding   ETOH abuse -Patient has not not drank alcohol since he was admitted to the hospital in December 2022. Continue to encourage abstinence from alcohol   Dysphagia -Patient complains of food and liquids getting stuck in his throat causing him to vomit it back up.  Denies complaints of coughing while eating.  He has a prior history of alcohol abuse.  Management per gastroenterology, currently advanced to regular diet.   Suspected acute cholecystitis -Patient presented with complaints of abdominal pain with nausea and vomiting on admission.  CT scan of the abdomen pelvis significant for increased density in the gallbladder concerning for milk of calcium or large gallstones with gallbladder wall thickening.  General surgery has been consulted and evaluated patient, and they do not feel that imaging and physical exam and symptoms are consistent with acute cholecystitis.  They signed off.  He was initially placed on ceftriaxone but it has been discontinued now   Weight loss -family reports 20-30 pound weight loss over the last month.  Question if multifactorial in the recent NSTEMI, ESRD, and constipation with poor p.o. intake.  TSH unremarkable.    HLD (hyperlipidemia)- (present on admission) -Continue statin  Scheduled Meds:  (feeding supplement) PROSource Plus  30 mL Oral TID BM   apixaban  5 mg Oral BID   bisacodyl  10 mg Rectal Daily   Chlorhexidine Gluconate Cloth  6 each Topical Q0600   darbepoetin (ARANESP) injection - DIALYSIS  60 mcg Intravenous Q Mon-HD    diltiazem   Topical BID   ferric citrate  420 mg Oral TID WC   gabapentin  100 mg Oral BID   hydrocortisone  25 mg Rectal BID   hydrOXYzine  10 mg Oral Q M,W,F-1800   magnesium oxide  200 mg Oral Daily   midodrine  10 mg Oral TID WC   midodrine  10 mg Oral Q M,W,F-HD   mirtazapine  3.75 mg Oral QHS   multivitamin  1 tablet Oral QHS   polyethylene glycol  17 g Oral BID   senna-docusate  2 tablet Oral BID   sodium chloride flush  3 mL Intravenous Q12H   Continuous Infusions:  sodium chloride     sodium chloride     promethazine (PHENERGAN) injection (IM or IVPB) Stopped (06/13/21 2334)   PRN Meds:.sodium chloride, sodium chloride, acetaminophen **OR** acetaminophen, albuterol, alteplase, heparin, heparin sodium (porcine), HYDROmorphone (DILAUDID) injection, hydrOXYzine, lidocaine (PF), lidocaine-prilocaine, ondansetron (ZOFRAN) IV, oxyCODONE, pentafluoroprop-tetrafluoroeth, promethazine (PHENERGAN) injection (IM or IVPB)  Diet Orders (From admission, onward)     Start     Ordered   06/15/21 1124  Diet regular Room service appropriate? Yes; Fluid consistency: Thin; Fluid restriction: 1200 mL Fluid  Diet effective now       Question Answer Comment  Room service appropriate? Yes   Fluid consistency: Thin   Fluid restriction: 1200 mL Fluid  06/15/21 1123            DVT prophylaxis:  apixaban (ELIQUIS) tablet 5 mg   Lab Results  Component Value Date   PLT 152 06/21/2021      Code Status: Full Code  Family Communication: Discussed with daughter over the phone  Status is: Inpatient  Remains inpatient appropriate because: Increasing shortness of breath   Level of care: Telemetry Medical  Consultants:  GI Nephrology   Procedures:  none  Microbiology  none  Antimicrobials: none    Objective: Vitals:   06/21/21 1530 06/21/21 1600 06/21/21 1630 06/21/21 1700  BP: 103/60 116/72 124/65 118/64  Pulse: 70 77 80 77  Resp: (!) 25 13 18    Temp:       TempSrc:      SpO2:      Weight:      Height:        Intake/Output Summary (Last 24 hours) at 06/21/2021 1715 Last data filed at 06/21/2021 0900 Gross per 24 hour  Intake 0 ml  Output --  Net 0 ml    Wt Readings from Last 3 Encounters:  06/21/21 70.3 kg  06/02/21 23.1 kg  05/28/21 75.1 kg    Examination:  Constitutional: NAD Eyes: lids and conjunctivae normal, no scleral icterus ENMT: mmm Neck: normal, supple Respiratory: Faint bilateral wheezing, faint crackles Cardiovascular: Regular rate and rhythm, no murmurs / rubs / gallops. No LE edema. Abdomen: soft, no distention, no tenderness. Bowel sounds positive.  Skin: no rashes Neurologic: no focal deficits, equal strength   Data Reviewed: I have independently reviewed following labs and imaging studies   CBC Recent Labs  Lab 06/17/21 0738 06/18/21 0209 06/19/21 0247 06/20/21 0121 06/21/21 1435  WBC 6.6 6.6 7.3 6.8 8.2  HGB 10.0* 10.1* 9.9* 9.9* 10.2*  HCT 29.9* 30.1* 30.9* 29.8* 31.2*  PLT 150 152 142* 145* 152  MCV 97.7 98.4 98.7 99.3 99.4  MCH 32.7 33.0 31.6 33.0 32.5  MCHC 33.4 33.6 32.0 33.2 32.7  RDW 19.5* 19.9* 19.9* 19.6* 19.4*     Recent Labs  Lab 06/15/21 0239 06/16/21 0106 06/17/21 0738 06/18/21 0209 06/19/21 0247 06/21/21 1435  NA 138 137 134* 135 133* 132*  K 4.0 4.1 4.5 4.1 4.0 4.3  CL 97* 98 97* 96* 95* 92*  CO2 23 24 21* 24 24 26   GLUCOSE 77 107* 153* 112* 107* 115*  BUN 38* 56* 83* 44* 66* 71*  CREATININE 5.14* 6.93* 8.67* 5.61* 7.25* 7.63*  CALCIUM 8.8* 9.2 9.1 8.2* 8.8* 8.6*  AST 299* 208* 111* 83*  --  35  ALT 301* 253* 193* 154*  --  73*  ALKPHOS 217* 190* 164* 138*  --  107  BILITOT 1.6* 1.2 1.3* 1.4*  --  1.1  ALBUMIN 3.2* 3.0* 3.1* 3.0* 3.0* 3.0*   3.0*     ------------------------------------------------------------------------------------------------------------------ No results for input(s): CHOL, HDL, LDLCALC, TRIG, CHOLHDL, LDLDIRECT in the last 72  hours.  Lab Results  Component Value Date   HGBA1C 5.7 (H) 04/30/2021   ------------------------------------------------------------------------------------------------------------------ No results for input(s): TSH, T4TOTAL, T3FREE, THYROIDAB in the last 72 hours.  Invalid input(s): FREET3  Cardiac Enzymes No results for input(s): CKMB, TROPONINI, MYOGLOBIN in the last 168 hours.  Invalid input(s): CK ------------------------------------------------------------------------------------------------------------------    Component Value Date/Time   BNP >4,500.0 (H) 06/14/2021 0041    CBG: Recent Labs  Lab 06/20/21 0817 06/20/21 1151 06/20/21 1620 06/20/21 2340 06/21/21 0948  GLUCAP 108* 137* 159* 128* 123*  No results found for this or any previous visit (from the past 240 hour(s)).   Radiology Studies: No results found.   Marzetta Board, MD, [name] [facility]  Between 7 am - 7 pm I am available, please contact me via Amion (for emergencies) or Securechat (non urgent messages)  Between 7 pm - 7 am I am not available, please contact night coverage MD/APP via Amion

## 2021-06-21 NOTE — Progress Notes (Signed)
Contacted [facility] and spoke to [employer], Therapist, sports. Clinic is only able to provide treatment today if pt arrives by 11:00. Clinic cannot provide treatment tomorrow due to no availability. Contacted attending and RN CM to inquire if pt would be appropriate for early d/c. Pt is not appropriate for early d/c due to d/c arrangements unable to be confirmed by 11:00. Contacted inpt HD unit to advise them that pt will not be able to get to clinic for HD today and pt will need HD here as soon as they can get pt.   Melven Sartorius Renal Navigator [phone]

## 2021-06-21 NOTE — Consult Note (Signed)
Brief Psychiatry Consult Note  The patient was last seen by the psychiatry service on 2/17. Interim documentation by primary team and nursing staff has been reviewed. At this time, patient is resting after spending AM struggling with SOB; will defer full assessment (discussed with family who feels he needs rest now). Please see last consult note for full assessment. Will try to see either Sat or Sun.   Ruben [name] A Kem Parcher

## 2021-06-21 NOTE — Progress Notes (Signed)
PT Cancellation Note  Patient Details Name: Ruben [name] MRN: [id number] DOB: 12-Jul-1945   Cancelled Treatment:    Reason Eval/Treat Not Completed: (P) Patient at procedure or test/unavailable (pt off unit at HD.) Will continue efforts per PT POC as schedule permits.   Magic Mohler M Nicolas Banh 06/21/2021, 3:06 PM

## 2021-06-21 NOTE — Telephone Encounter (Signed)
Called pt wanting to complete TCM & get a appointment scheduled. Patient not fully discharged at the moment, will give the pt a call back once discharged.

## 2021-06-21 NOTE — Progress Notes (Signed)
[facility] Progress Note   Subjective:   Discharge yesterday was cancelled due to weakness yesterday. This AM, patient reports feeling more short of breath. On 4L O2 with diffuse wheezing on exam. Has PRN albuterol ordered, RN planning to start a breathing treatment. He denies chest pain, palpitations, dizziness, abdominal pain and nausea.   Objective Vitals:   06/20/21 2035 06/21/21 0424 06/21/21 0853 06/21/21 0904  BP: 103/64 106/74 135/73   Pulse: 71 71 100 92  Resp:  18 20 (!) 24  Temp: (!) 97.4 F (36.3 C) (!) 97.4 F (36.3 C) 98.6 F (37 C)   TempSrc: Oral Oral    SpO2: 97% 99% 95% 92%  Weight:      Height:       Physical Exam General: Elderly male, alert and in NAD Heart: RRR, no murmur, rubs or gallops Lungs: mild tachypnea, expiratory wheezing throughout lung fields. No rhonchi or rales Abdomen: Soft, non-distended, +BS Extremities: No edema b/l lower extremities Dialysis Access: [facility]  Additional Objective Labs: Basic Metabolic Panel: Recent Labs  Lab 06/17/21 0738 06/18/21 0209 06/19/21 0247  NA 134* 135 133*  K 4.5 4.1 4.0  CL 97* 96* 95*  CO2 21* 24 24  GLUCOSE 153* 112* 107*  BUN 83* 44* 66*  CREATININE 8.67* 5.61* 7.25*  CALCIUM 9.1 8.2* 8.8*  PHOS  --   --  6.5*   Liver Function Tests: Recent Labs  Lab 06/16/21 0106 06/17/21 0738 06/18/21 0209 06/19/21 0247  AST 208* 111* 83*  --   ALT 253* 193* 154*  --   ALKPHOS 190* 164* 138*  --   BILITOT 1.2 1.3* 1.4*  --   PROT 5.8* 6.3* 6.0*  --   ALBUMIN 3.0* 3.1* 3.0* 3.0*   No results for input(s): LIPASE, AMYLASE in the last 168 hours. CBC: Recent Labs  Lab 06/16/21 0106 06/17/21 0738 06/18/21 0209 06/19/21 0247 06/20/21 0121  WBC 7.1 6.6 6.6 7.3 6.8  HGB 9.7* 10.0* 10.1* 9.9* 9.9*  HCT 29.1* 29.9* 30.1* 30.9* 29.8*  MCV 99.0 97.7 98.4 98.7 99.3  PLT 156 150 152 142* 145*   Blood Culture    Component Value Date/Time   SDES BLOOD RIGHT HAND 05/01/2021 0949    SPECREQUEST  05/01/2021 0949    BOTTLES DRAWN AEROBIC AND ANAEROBIC Blood Culture results may not be optimal due to an inadequate volume of blood received in culture bottles   CULT  05/01/2021 0949    NO GROWTH 5 DAYS Performed at [facility] [address], [place], [place] [address]    REPTSTATUS 05/06/2021 FINAL 05/01/2021 0949    Cardiac Enzymes: No results for input(s): CKTOTAL, CKMB, CKMBINDEX, TROPONINI in the last 168 hours. CBG: Recent Labs  Lab 06/19/21 1646 06/20/21 0817 06/20/21 1151 06/20/21 1620 06/20/21 2340  GLUCAP 107* 108* 137* 159* 128*   Iron Studies: No results for input(s): IRON, TIBC, TRANSFERRIN, FERRITIN in the last 72 hours. @lablastinr3 @ Studies/Results: No results found. Medications:  promethazine (PHENERGAN) injection (IM or IVPB) Stopped (06/13/21 2334)    (feeding supplement) PROSource Plus  30 mL Oral TID BM   apixaban  5 mg Oral BID   bisacodyl  10 mg Rectal Daily   Chlorhexidine Gluconate Cloth  6 each Topical Q0600   darbepoetin (ARANESP) injection - DIALYSIS  60 mcg Intravenous Q Mon-HD   diltiazem   Topical BID   ferric citrate  420 mg Oral TID WC   gabapentin  100 mg Oral BID  hydrocortisone  25 mg Rectal BID   hydrOXYzine  10 mg Oral Q M,W,F-1800   magnesium oxide  200 mg Oral Daily   midodrine  10 mg Oral TID WC   midodrine  10 mg Oral Q M,W,F-HD   mirtazapine  3.75 mg Oral QHS   multivitamin  1 tablet Oral QHS   polyethylene glycol  17 g Oral BID   senna-docusate  2 tablet Oral BID   sodium chloride flush  3 mL Intravenous Q12H    Dialysis Orders: MWF at [facility] 4hr, 400/500, EDW 72kg (although leaving 74kg range), 2K/2Ca, TDC (+ maturing AVF), heparin 2000 unit bolus - No ESA or VDRA  Assessment/Plan: Proctitis/ rectal ulceration/ fecal impaction: On aggressive bowel regimen.  Disimpacted by GI w/large amount of stool removed. Having good BM but continued rectal pain.  GI following - FFS on 2/10 rectal  ulceration/proctitis, biopsies taken.  Liver/kidney lesions: Elevated LFTs. MRI showed benign liver cysts stable since 2019, no suspicious liver lesions, 2 small indeterminate T2 hypointense renal cortical lesions.  Dysphagia: Swallow studies normal.  ESRD:  MWF. HD today, SOB on exam but very wheezy. Hx of tobacco use and wears O2 at home, but reports he has not been diagnosed with COPD. RN giving albuterol. Will continue to titrate volume down with HD as tolerated.  Hypotension/volume: Chronically low BP, on midodrine 10mg  TID. Ordered prn midodrine to be given pre HD. Continue UF as BP allows. Under EDW, will need to be lowered on d/c . Anemia: Hgb 9.9 - FOBT negative. Tsat 24%, IV iron ordered. Aranesp 60 q Monday.  Metabolic bone disease: CorrCa and Phos high.  Increase Auryxia to 2 AC TID.  CAD/HFrEF - missed outpatient cardiology follow up.   Will need to reschedule after d/c unless need to be seen as inpatient. A-fib: On amiodarone. Eliquis on hold. Heparin per pharmacy.  Nutrition - On regular diet. RD concerned for poor intake. Prot supp for low albumin.  Follow labs.  Depression/anxiety - psych consulted.  Not a danger to himself.   Anice Paganini, PA-C 06/21/2021, 9:12 AM  [facility] Pager: [phone]

## 2021-06-22 LAB — CBC
HCT: 31.7 % — ABNORMAL LOW (ref 39.0–52.0)
HCT: 30.8 % — ABNORMAL LOW (ref 39.0–52.0)
Hemoglobin: 10 g/dL — ABNORMAL LOW (ref 13.0–17.0)
Hemoglobin: 9.7 g/dL — ABNORMAL LOW (ref 13.0–17.0)
MCH: 31.4 pg (ref 26.0–34.0)
MCH: 32 pg (ref 26.0–34.0)
MCHC: 31.5 g/dL (ref 30.0–36.0)
MCHC: 31.5 g/dL (ref 30.0–36.0)
MCV: 99.7 fL (ref 80.0–100.0)
MCV: 101.7 fL — ABNORMAL HIGH (ref 80.0–100.0)
Platelets: 148 10*3/uL — ABNORMAL LOW (ref 150–400)
Platelets: 141 10*3/uL — ABNORMAL LOW (ref 150–400)
RBC: 3.18 MIL/uL — ABNORMAL LOW (ref 4.22–5.81)
RBC: 3.03 MIL/uL — ABNORMAL LOW (ref 4.22–5.81)
RDW: 19.5 % — ABNORMAL HIGH (ref 11.5–15.5)
RDW: 19.6 % — ABNORMAL HIGH (ref 11.5–15.5)
WBC: 6.7 10*3/uL (ref 4.0–10.5)
WBC: 6.7 10*3/uL (ref 4.0–10.5)
nRBC: 0 % (ref 0.0–0.2)
nRBC: 0 % (ref 0.0–0.2)

## 2021-06-22 LAB — RENAL FUNCTION PANEL
Albumin: 2.9 g/dL — ABNORMAL LOW (ref 3.5–5.0)
Anion gap: 12 (ref 5–15)
BUN: 45 mg/dL — ABNORMAL HIGH (ref 8–23)
CO2: 27 mmol/L (ref 22–32)
Calcium: 8.6 mg/dL — ABNORMAL LOW (ref 8.9–10.3)
Chloride: 94 mmol/L — ABNORMAL LOW (ref 98–111)
Creatinine, Ser: 6 mg/dL — ABNORMAL HIGH (ref 0.61–1.24)
GFR, Estimated: 9 mL/min — ABNORMAL LOW (ref >60)
Glucose, Bld: 115 mg/dL — ABNORMAL HIGH (ref 70–99)
Phosphorus: 4.9 mg/dL — ABNORMAL HIGH (ref 2.5–4.6)
Potassium: 4 mmol/L (ref 3.5–5.1)
Sodium: 133 mmol/L — ABNORMAL LOW (ref 135–145)

## 2021-06-22 LAB — GLUCOSE, CAPILLARY: Glucose-Capillary: 99 mg/dL (ref 70–99)

## 2021-06-22 MED ORDER — CHLORHEXIDINE GLUCONATE CLOTH 2 % EX PADS: 6.0000 | MEDICATED_PAD | Freq: Every day | CUTANEOUS | Status: DC

## 2021-06-22 MED ORDER — ALBUMIN HUMAN 25 % IV SOLN
INTRAVENOUS | Status: AC
  Administered 2021-06-22: 12.5 g
  Filled 2021-06-22: qty 50

## 2021-06-22 NOTE — Progress Notes (Addendum)
PROGRESS NOTE  Ruben [name] HUT:[id number] DOB: 01/12/1946 DOA: 06/07/2021 PCP: [name], FNP   LOS: 14 days   Brief Narrative / Interim history:  76 y.o. male with medical history significant of hypertension, hyperlipidemia, ESRD on HD, CAD, recent hospitalization for non-STEMI and acute systolic CHF with cardiogenic shock requiring ICU stay and inotrope support, A. fib with RVR, prior alcohol abuse, and tobacco abuse who presents with complaints of rectal pain and difficulty swallowing over the last 2 weeks.  His most recent hospital stay was prolonged, he was here for almost a month.  He has declined since with weight loss, poor p.o. intake, dysphagia as well as increased constipation.  There was concern for acute cholecystitis on admission and general surgery was consulted.  Initial imaging showed some nonspecific liver lesions, on a noncontrast study, and also patient complained of dysphagia for which GI was consulted. Overall improving, his liver lesions are not significant. On 2/17 he was supposed to go home but became more dyspneic, underwent HD x 2 2/17 and 2/18.  Subjective / 24h Interval events: Not as short of breath this morning. Feels better  Assessment and Plan: Principal problem Rectal pain and constipation- (present on admission) -patient reports complaints of constipation and rectal pain for which she has been unable to have adequate bowel movement despite taking stool softeners, sitz bath's, use of Preparation H, and suppositories. CT scan of the abdomen pelvis did not note any signs of fecal impaction or bowel obstruction, but appears to show stool throughout the colon.  GI consulted, status post manual disimpaction.  Continue aggressive bowel regimen, significantly improved, continues to have good bowel movements.  Remains with severe rectal pain at times.  Daughter tells me over the phone that she asked for IV Dilaudid overnight but he never received it.  Feels like  tramadol does not work.  Switch to oxycodone, understanding the risk of him becoming constipated.  Continue aggressive bowel regimen   Active problems Chronic hypoxic respiratory failure-patient on chronic 2 L nasal cannula.  He developed shortness of breath 2/17 right before his scheduled HD, findings consistent with fluid overload.  Underwent HD 2/17, afterwards a chest x-ray still showed fluid overload.  Repeat HD today  Chronic systolic CHF (congestive heart failure) ([place])- (present on admission) -Following NSTEMI in January 2023 patient was noted to have gone into cardiogenic shock and required temporary stay in ICU requiring pressors which were able to be weaned off and he was transition to midodrine.  EF 35-40% on echocardiogram with moderate mitral regurgitation. Continue midodrine, fluid management per dialysis   Liver lesions, elevated LFTs - CT scan on admission showed low-attenuation lesions measuring about 12 mm diameter in the liver.  Further imaging with CT scan with IV contrast as well as MRI showing fairly benign lesions   CAD (coronary artery disease)- (present on admission) -Patient had recent NSTEMI 04/2021 for which he underwent cardiac catheterization noting RCA occlusion with diffuse disease of the LAD and left circumflex.  It was not amendable to PCI and he was not a surgical candidate for CABG. Continue statin   Paroxysmal atrial fibrillation ([place]) -Patient appears to be in sinus rhythm at this time and rate controlled.  He was initially maintained on heparin, continue Eliquis   ESRD on hemodialysis [facility]) -He has new end-stage renal disease following his most recent hospital stay.  He was discharged January 2023.  Currently MWF.  Just had an AV fistula placed, it has not matured and currently  having dialysis via HD cath.   Anxiety and Depression - Psychiatry consulted, started on Remeron 7.5 mg nightly for depression, insomnia, poor appetite.  He was also started on  hydroxyzine 10 mg to use before dialysis for anxiety   Transaminitis- (present on admission) -AST was noted to be mildly elevated at 55, but lipase was noted to be within normal limits.  Possibly related with patient's prior history of abuse.   Normocytic anemia- (present on admission) -Stable.  No bleeding   ETOH abuse -Patient has not not drank alcohol since he was admitted to the hospital in December 2022. Continue to encourage abstinence from alcohol   Dysphagia -Patient complains of food and liquids getting stuck in his throat causing him to vomit it back up.  Denies complaints of coughing while eating.  He has a prior history of alcohol abuse.  Management per gastroenterology, currently advanced to regular diet.   Suspected acute cholecystitis -Patient presented with complaints of abdominal pain with nausea and vomiting on admission.  CT scan of the abdomen pelvis significant for increased density in the gallbladder concerning for milk of calcium or large gallstones with gallbladder wall thickening.  General surgery has been consulted and evaluated patient, and they do not feel that imaging and physical exam and symptoms are consistent with acute cholecystitis.  They signed off.  He was initially placed on ceftriaxone but it has been discontinued now   Weight loss -family reports 20-30 pound weight loss over the last month.  Question if multifactorial in the recent NSTEMI, ESRD, and constipation with poor p.o. intake.  TSH unremarkable.    HLD (hyperlipidemia)- (present on admission) -Continue statin  Scheduled Meds:  (feeding supplement) PROSource Plus  30 mL Oral TID BM   apixaban  5 mg Oral BID   bisacodyl  10 mg Rectal Daily   Chlorhexidine Gluconate Cloth  6 each Topical Q0600   darbepoetin (ARANESP) injection - DIALYSIS  60 mcg Intravenous Q Mon-HD   diltiazem   Topical BID   ferric citrate  420 mg Oral TID WC   gabapentin  100 mg Oral BID   hydrocortisone  25 mg Rectal  BID   hydrOXYzine  10 mg Oral Q M,W,F-1800   magnesium oxide  200 mg Oral Daily   midodrine  10 mg Oral TID WC   midodrine  10 mg Oral Q M,W,F-HD   mirtazapine  3.75 mg Oral QHS   multivitamin  1 tablet Oral QHS   polyethylene glycol  17 g Oral BID   senna-docusate  2 tablet Oral BID   sodium chloride flush  3 mL Intravenous Q12H   Continuous Infusions:  promethazine (PHENERGAN) injection (IM or IVPB) Stopped (06/13/21 2334)   PRN Meds:.acetaminophen **OR** acetaminophen, albuterol, heparin sodium (porcine), HYDROmorphone (DILAUDID) injection, hydrOXYzine, ondansetron (ZOFRAN) IV, oxyCODONE, promethazine (PHENERGAN) injection (IM or IVPB)  Diet Orders (From admission, onward)     Start     Ordered   06/15/21 1124  Diet regular Room service appropriate? Yes; Fluid consistency: Thin; Fluid restriction: 1200 mL Fluid  Diet effective now       Question Answer Comment  Room service appropriate? Yes   Fluid consistency: Thin   Fluid restriction: 1200 mL Fluid      06/15/21 1123            DVT prophylaxis:  apixaban (ELIQUIS) tablet 5 mg   Lab Results  Component Value Date   PLT 148 (L) 06/22/2021      Code  Status: Full Code  Family Communication: Discussed with wife and son at bedside  Status is: Inpatient  Remains inpatient appropriate because: Increasing shortness of breath   Level of care: Telemetry Medical  Consultants:  GI Nephrology   Procedures:  none  Microbiology  none  Antimicrobials: none    Objective: Vitals:   06/22/21 1252 06/22/21 1317 06/22/21 1331 06/22/21 1400  BP: (!) 89/48 (!) 84/47 105/68 (!) 84/48  Pulse: 76 73 72 65  Resp: (!) 21 15 16 14   Temp: 98.4 F (36.9 C)     TempSrc:      SpO2: 100% 100%  100%  Weight: 68 kg     Height:        Intake/Output Summary (Last 24 hours) at 06/22/2021 1425 Last data filed at 06/22/2021 0600 Gross per 24 hour  Intake 240 ml  Output 2000 ml  Net -1760 ml    Wt Readings from Last 3  Encounters:  06/22/21 68 kg  06/02/21 23.1 kg  05/28/21 75.1 kg    Examination:  Constitutional: NAD Eyes: lids and conjunctivae normal, no scleral icterus ENMT: mmm Neck: normal, supple Respiratory: clear to auscultation bilaterally, no wheezing, no crackles. Normal respiratory effort.  Cardiovascular: Regular rate and rhythm, no murmurs / rubs / gallops. No LE edema. Abdomen: soft, no distention, no tenderness. Bowel sounds positive.  Skin: no rashes Neurologic: no focal deficits, equal strength   Data Reviewed: I have independently reviewed following labs and imaging studies   CBC Recent Labs  Lab 06/18/21 0209 06/19/21 0247 06/20/21 0121 06/21/21 1435 06/22/21 1053  WBC 6.6 7.3 6.8 8.2 6.7  HGB 10.1* 9.9* 9.9* 10.2* 10.0*  HCT 30.1* 30.9* 29.8* 31.2* 31.7*  PLT 152 142* 145* 152 148*  MCV 98.4 98.7 99.3 99.4 99.7  MCH 33.0 31.6 33.0 32.5 31.4  MCHC 33.6 32.0 33.2 32.7 31.5  RDW 19.9* 19.9* 19.6* 19.4* 19.5*     Recent Labs  Lab 06/16/21 0106 06/17/21 0738 06/18/21 0209 06/19/21 0247 06/21/21 1435 06/22/21 1053  NA 137 134* 135 133* 132* 133*  K 4.1 4.5 4.1 4.0 4.3 4.0  CL 98 97* 96* 95* 92* 94*  CO2 24 21* 24 24 26 27   GLUCOSE 107* 153* 112* 107* 115* 115*  BUN 56* 83* 44* 66* 71* 45*  CREATININE 6.93* 8.67* 5.61* 7.25* 7.63* 6.00*  CALCIUM 9.2 9.1 8.2* 8.8* 8.6* 8.6*  AST 208* 111* 83*  --  35  --   ALT 253* 193* 154*  --  73*  --   ALKPHOS 190* 164* 138*  --  107  --   BILITOT 1.2 1.3* 1.4*  --  1.1  --   ALBUMIN 3.0* 3.1* 3.0* 3.0* 3.0*   3.0* 2.9*     ------------------------------------------------------------------------------------------------------------------ No results for input(s): CHOL, HDL, LDLCALC, TRIG, CHOLHDL, LDLDIRECT in the last 72 hours.  Lab Results  Component Value Date   HGBA1C 5.7 (H) 04/30/2021   ------------------------------------------------------------------------------------------------------------------ No  results for input(s): TSH, T4TOTAL, T3FREE, THYROIDAB in the last 72 hours.  Invalid input(s): FREET3  Cardiac Enzymes No results for input(s): CKMB, TROPONINI, MYOGLOBIN in the last 168 hours.  Invalid input(s): CK ------------------------------------------------------------------------------------------------------------------    Component Value Date/Time   BNP >4,500.0 (H) 06/14/2021 0041    CBG: Recent Labs  Lab 06/20/21 2340 06/21/21 0948 06/21/21 1818 06/21/21 2356 06/22/21 0817  GLUCAP 128* 123* 107* 121* 99     No results found for this or any previous visit (from the past 240  hour(s)).   Radiology Studies: DG CHEST PORT 1 VIEW  Result Date: 06/21/2021 CLINICAL DATA:  Dyspnea EXAM: PORTABLE CHEST 1 VIEW COMPARISON:  06/08/2021, 06/12/2021 FINDINGS: Single frontal view of the chest demonstrates a stable right internal jugular dialysis catheter. The cardiac silhouette is enlarged. Persistent right pleural effusion and right basilar consolidation. Diffuse increased interstitial prominence consistent with interstitial edema. No pneumothorax. No acute bony abnormalities. IMPRESSION: 1. Stable diffuse increased interstitial prominence compatible with interstitial edema. 2. Stable right basilar consolidation and right effusion. Electronically Signed   By: Randa Ngo M.D.   On: 06/21/2021 18:48     Marzetta Board, MD, [name] [facility]  Between 7 am - 7 pm I am available, please contact me via Amion (for emergencies) or Securechat (non urgent messages)  Between 7 pm - 7 am I am not available, please contact night coverage MD/APP via Amion

## 2021-06-22 NOTE — Consult Note (Signed)
Brief Psychiatry Consult Note  Tried to see pt however scheduled for extra dialysis d/t pulmonary edema. Did run into son (prev had consent to talk to) who mentioned his father perked up yesterday after dialysis and has been eating more since initiation of remeron  [place]

## 2021-06-22 NOTE — Plan of Care (Signed)
?  Problem: Elimination: ?Goal: Will not experience complications related to bowel motility ?Outcome: Progressing ?  ?Problem: Pain Managment: ?Goal: General experience of comfort will improve ?Outcome: Progressing ?  ?Problem: Safety: ?Goal: Ability to remain free from injury will improve ?Outcome: Progressing ?  ?

## 2021-06-22 NOTE — Progress Notes (Signed)
[facility] Progress Note   Subjective:   Tolerated 2L UF yesterday, reports his breathing is a little bit better but CXR yesterday still showed pulmonary edema. Pt continues to report rectal pain. Denies CP, palpitations, dizziness and nausea.  Objective Vitals:   06/21/21 1829 06/21/21 2049 06/21/21 2050 06/22/21 0443  BP: 118/60 90/62 90/62  96/60  Pulse: 81 74 74 64  Resp: 20  18 18   Temp: 98.2 F (36.8 C) 98 F (36.7 C) 98 F (36.7 C) 98.6 F (37 C)  TempSrc: Oral Oral Oral Oral  SpO2: 100% 100% 100% 100%  Weight:    67.9 kg  Height:       Physical Exam General: Elderly male, alert and in NAD Heart: RRR, no murmur Lungs: CTA bilaterally without wheezing, rhonchi or rales Abdomen: Soft, non-distended, +BS Extremities: No edema b/l lower extremities Dialysis Access: LUE AVF +t/b, R IJ [facility]  Additional Objective Labs: Basic Metabolic Panel: Recent Labs  Lab 06/18/21 0209 06/19/21 0247 06/21/21 1435  NA 135 133* 132*  K 4.1 4.0 4.3  CL 96* 95* 92*  CO2 24 24 26   GLUCOSE 112* 107* 115*  BUN 44* 66* 71*  CREATININE 5.61* 7.25* 7.63*  CALCIUM 8.2* 8.8* 8.6*  PHOS  --  6.5* 6.0*   Liver Function Tests: Recent Labs  Lab 06/17/21 0738 06/18/21 0209 06/19/21 0247 06/21/21 1435  AST 111* 83*  --  35  ALT 193* 154*  --  73*  ALKPHOS 164* 138*  --  107  BILITOT 1.3* 1.4*  --  1.1  PROT 6.3* 6.0*  --  6.1*  ALBUMIN 3.1* 3.0* 3.0* 3.0*   3.0*   No results for input(s): LIPASE, AMYLASE in the last 168 hours. CBC: Recent Labs  Lab 06/17/21 0738 06/18/21 0209 06/19/21 0247 06/20/21 0121 06/21/21 1435  WBC 6.6 6.6 7.3 6.8 8.2  HGB 10.0* 10.1* 9.9* 9.9* 10.2*  HCT 29.9* 30.1* 30.9* 29.8* 31.2*  MCV 97.7 98.4 98.7 99.3 99.4  PLT 150 152 142* 145* 152   Blood Culture    Component Value Date/Time   SDES BLOOD RIGHT HAND 05/01/2021 0949   SPECREQUEST  05/01/2021 0949    BOTTLES DRAWN AEROBIC AND ANAEROBIC Blood Culture results may not be  optimal due to an inadequate volume of blood received in culture bottles   CULT  05/01/2021 0949    NO GROWTH 5 DAYS Performed at [facility] [address] [place], [place] [address]    REPTSTATUS 05/06/2021 FINAL 05/01/2021 0949    Cardiac Enzymes: No results for input(s): CKTOTAL, CKMB, CKMBINDEX, TROPONINI in the last 168 hours. CBG: Recent Labs  Lab 06/20/21 1620 06/20/21 2340 06/21/21 0948 06/21/21 1818 06/21/21 2356  GLUCAP 159* 128* 123* 107* 121*   Iron Studies: No results for input(s): IRON, TIBC, TRANSFERRIN, FERRITIN in the last 72 hours. @lablastinr3 @ Studies/Results: DG CHEST PORT 1 VIEW  Result Date: 06/21/2021 CLINICAL DATA:  Dyspnea EXAM: PORTABLE CHEST 1 VIEW COMPARISON:  06/08/2021, 06/12/2021 FINDINGS: Single frontal view of the chest demonstrates a stable right internal jugular dialysis catheter. The cardiac silhouette is enlarged. Persistent right pleural effusion and right basilar consolidation. Diffuse increased interstitial prominence consistent with interstitial edema. No pneumothorax. No acute bony abnormalities. IMPRESSION: 1. Stable diffuse increased interstitial prominence compatible with interstitial edema. 2. Stable right basilar consolidation and right effusion. Electronically Signed   By: Randa Ngo M.D.   On: 06/21/2021 18:48   Medications:  promethazine (PHENERGAN) injection (IM or IVPB)  Stopped (06/13/21 2334)    (feeding supplement) PROSource Plus  30 mL Oral TID BM   apixaban  5 mg Oral BID   bisacodyl  10 mg Rectal Daily   Chlorhexidine Gluconate Cloth  6 each Topical Q0600   darbepoetin (ARANESP) injection - DIALYSIS  60 mcg Intravenous Q Mon-HD   diltiazem   Topical BID   ferric citrate  420 mg Oral TID WC   gabapentin  100 mg Oral BID   hydrocortisone  25 mg Rectal BID   hydrOXYzine  10 mg Oral Q M,W,F-1800   magnesium oxide  200 mg Oral Daily   midodrine  10 mg Oral TID WC   midodrine  10 mg Oral Q M,W,F-HD    mirtazapine  3.75 mg Oral QHS   multivitamin  1 tablet Oral QHS   polyethylene glycol  17 g Oral BID   senna-docusate  2 tablet Oral BID   sodium chloride flush  3 mL Intravenous Q12H    Dialysis Orders: MWF at [facility] 4hr, 400/500, EDW 72kg (although leaving 74kg range), 2K/2Ca, TDC (+ maturing AVF), heparin 2000 unit bolus - No ESA or VDRA  Assessment/Plan: Proctitis/ rectal ulceration/ fecal impaction: On aggressive bowel regimen.  Disimpacted by GI w/large amount of stool removed. Having good BM but continued rectal pain.  GI following - FFS on 2/10 rectal ulceration/proctitis, biopsies taken.  Liver/kidney lesions: Elevated LFTs. MRI showed benign liver cysts stable since 2019, no suspicious liver lesions, 2 small indeterminate T2 hypointense renal cortical lesions.  Dysphagia: Swallow studies normal.  ESRD:  MWF. Pt with worsening SOB yesterday. Improved somewhat post HD but CXR yesterday still with pulmonary edema. Pt is agreeable to extra HD today. UF as tolerated.  Hypotension/volume: Chronically low BP, on midodrine 10mg  TID. Ordered prn midodrine to be given pre HD. Continue UF as BP allows. Under EDW, will need to be lowered on d/c . Anemia: Hgb 10.2 - FOBT negative.  Aranesp 60 q Monday.  Metabolic bone disease: CorrCa was high, now improving. Phos has been elevated, increased Auryxia to 2 AC TID.  CAD/HFrEF - missed outpatient cardiology follow up.   Will need to reschedule after d/c unless need to be seen as inpatient. A-fib: On amiodarone.  Nutrition - On regular diet. RD concerned for poor intake. Prot supp for low albumin.  Follow labs.  Depression/anxiety - psych consulted.  Not a danger to himself.     Anice Paganini, PA-C 06/22/2021, 8:17 AM  [facility] Pager: [phone]

## 2021-06-23 DIAGNOSIS — F064 Anxiety disorder due to known physiological condition: Secondary | ICD-10-CM

## 2021-06-23 DIAGNOSIS — K769 Liver disease, unspecified: Secondary | ICD-10-CM

## 2021-06-23 DIAGNOSIS — R1032 Left lower quadrant pain: Secondary | ICD-10-CM

## 2021-06-23 DIAGNOSIS — K819 Cholecystitis, unspecified: Secondary | ICD-10-CM

## 2021-06-23 DIAGNOSIS — E7800 Pure hypercholesterolemia, unspecified: Secondary | ICD-10-CM

## 2021-06-23 LAB — CBC WITH DIFFERENTIAL/PLATELET
Abs Immature Granulocytes: 0.01 10*3/uL (ref 0.00–0.07)
Basophils Absolute: 0 10*3/uL (ref 0.0–0.1)
Basophils Relative: 0 %
Eosinophils Absolute: 0.1 10*3/uL (ref 0.0–0.5)
Eosinophils Relative: 2 %
HCT: 30.8 % — ABNORMAL LOW (ref 39.0–52.0)
Hemoglobin: 10.2 g/dL — ABNORMAL LOW (ref 13.0–17.0)
Immature Granulocytes: 0 %
Lymphocytes Relative: 19 %
Lymphs Abs: 1.1 10*3/uL (ref 0.7–4.0)
MCH: 32.7 pg (ref 26.0–34.0)
MCHC: 33.1 g/dL (ref 30.0–36.0)
MCV: 98.7 fL (ref 80.0–100.0)
Monocytes Absolute: 0.7 10*3/uL (ref 0.1–1.0)
Monocytes Relative: 12 %
Neutro Abs: 4.1 10*3/uL (ref 1.7–7.7)
Neutrophils Relative %: 67 %
Platelets: 147 10*3/uL — ABNORMAL LOW (ref 150–400)
RBC: 3.12 MIL/uL — ABNORMAL LOW (ref 4.22–5.81)
RDW: 19.2 % — ABNORMAL HIGH (ref 11.5–15.5)
WBC: 6 10*3/uL (ref 4.0–10.5)
nRBC: 0 % (ref 0.0–0.2)

## 2021-06-23 LAB — GLUCOSE, CAPILLARY
Glucose-Capillary: 124 mg/dL — ABNORMAL HIGH (ref 70–99)
Glucose-Capillary: 131 mg/dL — ABNORMAL HIGH (ref 70–99)
Glucose-Capillary: 132 mg/dL — ABNORMAL HIGH (ref 70–99)

## 2021-06-23 LAB — COMPREHENSIVE METABOLIC PANEL
ALT: 51 U/L — ABNORMAL HIGH (ref 0–44)
AST: 30 U/L (ref 15–41)
Albumin: 3.2 g/dL — ABNORMAL LOW (ref 3.5–5.0)
Alkaline Phosphatase: 92 U/L (ref 38–126)
Anion gap: 15 (ref 5–15)
BUN: 60 mg/dL — ABNORMAL HIGH (ref 8–23)
CO2: 26 mmol/L (ref 22–32)
Calcium: 8.8 mg/dL — ABNORMAL LOW (ref 8.9–10.3)
Chloride: 93 mmol/L — ABNORMAL LOW (ref 98–111)
Creatinine, Ser: 7.56 mg/dL — ABNORMAL HIGH (ref 0.61–1.24)
GFR, Estimated: 7 mL/min — ABNORMAL LOW (ref >60)
Glucose, Bld: 103 mg/dL — ABNORMAL HIGH (ref 70–99)
Potassium: 4 mmol/L (ref 3.5–5.1)
Sodium: 134 mmol/L — ABNORMAL LOW (ref 135–145)
Total Bilirubin: 1.3 mg/dL — ABNORMAL HIGH (ref 0.3–1.2)
Total Protein: 6.5 g/dL (ref 6.5–8.1)

## 2021-06-23 LAB — PHOSPHORUS: Phosphorus: 5.6 mg/dL — ABNORMAL HIGH (ref 2.5–4.6)

## 2021-06-23 LAB — MAGNESIUM: Magnesium: 2.9 mg/dL — ABNORMAL HIGH (ref 1.7–2.4)

## 2021-06-23 MED ORDER — ATORVASTATIN CALCIUM 80 MG PO TABS
80.0000 mg | ORAL_TABLET | Freq: Every day | ORAL | Status: DC
  Administered 2021-06-23 – 2021-06-27 (×5): 80 mg via ORAL
  Filled 2021-06-23 (×5): qty 1

## 2021-06-23 MED ORDER — CHLORHEXIDINE GLUCONATE CLOTH 2 % EX PADS
6.0000 | MEDICATED_PAD | Freq: Every day | CUTANEOUS | Status: DC
  Administered 2021-06-23 – 2021-06-25 (×3): 6 via TOPICAL

## 2021-06-23 NOTE — Progress Notes (Signed)
SATURATION QUALIFICATIONS: (This note is used to comply with regulatory documentation for home oxygen)  Patient Saturations on Room Air at Rest = 96%  Patient Saturations on Room Air while Ambulating = 92%-94%  Patient Saturations on n/a Liters of oxygen while Ambulating = n/a%  Hospital doctor Phone [phone]

## 2021-06-23 NOTE — Plan of Care (Signed)
  Problem: Nutrition: Goal: Adequate nutrition will be maintained Outcome: Progressing   Problem: Pain Managment: Goal: General experience of comfort will improve Outcome: Progressing   Problem: Safety: Goal: Ability to remain free from injury will improve Outcome: Progressing   

## 2021-06-23 NOTE — Consult Note (Signed)
Brief Psychiatry Consult Note  The patient was last seen by the psychiatry service for a full consult on 2/14; have been unable to see since due to dialysis and pt fatigue 2/2 pulmonary edema. On exam today he was seen in the afternoon reviewing his finances. Pt and family endorse some improvement in appetite since starting mirtazapine - no oversedation since first day. Dialysis was rough yesterday (pt became upset); discussed that he did not have hydroxyzine on that day. No SI, HI, AH/VH; anxiety still present (discussed this is a lagging effect of mirtazapine).    Interim documentation by primary team and nursing staff has been reviewed. At this time, would likely not recommend any further medication changes this hospitalization and there is no evidence of acute psychiatric disturbance requiring ongoing psychiatric consultation. Please see last consult note for full assessment. Final medication recommendations are as follows:  - c mirtazapine 3.75 QHS; discussed w/ family this can be increased outpt as he develops tolerance to sedating effects - c hydroxyzine 10 mg MWF prior to dialysis.  - pt to f/u with outpt pastor for counselling around role transitions.   We will sign off at this time. This has been communicated to the primary team. If issues arise in the future, don't hesitate to reconsult the Psychiatry Inpatient Consult Service.   Lajuana Patchell A Aaryana Betke

## 2021-06-23 NOTE — Progress Notes (Signed)
[facility] Progress Note   Subjective:   Pt had extra HD yesterday with net UF 2.6L. Reports breathing is better this AM. Denies CP, palpitations and dizziness.   Objective Vitals:   06/22/21 1800 06/22/21 1951 06/23/21 0513 06/23/21 0747  BP: (!) 99/54 (!) 100/47 (!) 97/53 (!) 93/52  Pulse: 77 79 74 75  Resp: 17 16 18 18   Temp: (!) 97.5 F (36.4 C) 98.3 F (36.8 C) 98.5 F (36.9 C) 97.8 F (36.6 C)  TempSrc: Oral Oral Oral Oral  SpO2: 93% 99% 98% 99%  Weight:      Height:       Physical Exam General: Elderly male, alert and in NAD Heart: RRR, no murmur Lungs: CTA bilaterally without wheezing, rhonchi or rales Abdomen: Soft, non-distended, +BS Extremities: No edema b/l lower extremities Dialysis Access: LUE AVF +t/b, R IJ [facility]  Additional Objective Labs: Basic Metabolic Panel: Recent Labs  Lab 06/21/21 1435 06/22/21 1053 06/23/21 0801  NA 132* 133* 134*  K 4.3 4.0 4.0  CL 92* 94* 93*  CO2 26 27 26   GLUCOSE 115* 115* 103*  BUN 71* 45* 60*  CREATININE 7.63* 6.00* 7.56*  CALCIUM 8.6* 8.6* 8.8*  PHOS 6.0* 4.9* 5.6*   Liver Function Tests: Recent Labs  Lab 06/18/21 0209 06/19/21 0247 06/21/21 1435 06/22/21 1053 06/23/21 0801  AST 83*  --  35  --  30  ALT 154*  --  73*  --  51*  ALKPHOS 138*  --  107  --  92  BILITOT 1.4*  --  1.1  --  1.3*  PROT 6.0*  --  6.1*  --  6.5  ALBUMIN 3.0*   < > 3.0*   3.0* 2.9* 3.2*   < > = values in this interval not displayed.   No results for input(s): LIPASE, AMYLASE in the last 168 hours. CBC: Recent Labs  Lab 06/20/21 0121 06/21/21 1435 06/22/21 1053 06/22/21 1634 06/23/21 0801  WBC 6.8 8.2 6.7 6.7 6.0  NEUTROABS  --   --   --   --  4.1  HGB 9.9* 10.2* 10.0* 9.7* 10.2*  HCT 29.8* 31.2* 31.7* 30.8* 30.8*  MCV 99.3 99.4 99.7 101.7* 98.7  PLT 145* 152 148* 141* 147*   Blood Culture    Component Value Date/Time   SDES BLOOD RIGHT HAND 05/01/2021 0949   SPECREQUEST  05/01/2021 0949    BOTTLES  DRAWN AEROBIC AND ANAEROBIC Blood Culture results may not be optimal due to an inadequate volume of blood received in culture bottles   CULT  05/01/2021 0949    NO GROWTH 5 DAYS Performed at [facility] [address], [place], [place] [address]    REPTSTATUS 05/06/2021 FINAL 05/01/2021 0949    Cardiac Enzymes: No results for input(s): CKTOTAL, CKMB, CKMBINDEX, TROPONINI in the last 168 hours. CBG: Recent Labs  Lab 06/21/21 1818 06/21/21 2356 06/22/21 0817 06/22/21 2359 06/23/21 0748  GLUCAP 107* 121* 99 131* 124*   Iron Studies: No results for input(s): IRON, TIBC, TRANSFERRIN, FERRITIN in the last 72 hours. @lablastinr3 @ Studies/Results: DG CHEST PORT 1 VIEW  Result Date: 06/21/2021 CLINICAL DATA:  Dyspnea EXAM: PORTABLE CHEST 1 VIEW COMPARISON:  06/08/2021, 06/12/2021 FINDINGS: Single frontal view of the chest demonstrates a stable right internal jugular dialysis catheter. The cardiac silhouette is enlarged. Persistent right pleural effusion and right basilar consolidation. Diffuse increased interstitial prominence consistent with interstitial edema. No pneumothorax. No acute bony abnormalities. IMPRESSION: 1. Stable diffuse  increased interstitial prominence compatible with interstitial edema. 2. Stable right basilar consolidation and right effusion. Electronically Signed   By: Randa Ngo M.D.   On: 06/21/2021 18:48   Medications:  promethazine (PHENERGAN) injection (IM or IVPB) Stopped (06/13/21 2334)    (feeding supplement) PROSource Plus  30 mL Oral TID BM   apixaban  5 mg Oral BID   bisacodyl  10 mg Rectal Daily   Chlorhexidine Gluconate Cloth  6 each Topical Q0600   darbepoetin (ARANESP) injection - DIALYSIS  60 mcg Intravenous Q Mon-HD   diltiazem   Topical BID   ferric citrate  420 mg Oral TID WC   gabapentin  100 mg Oral BID   hydrocortisone  25 mg Rectal BID   hydrOXYzine  10 mg Oral Q M,W,F-1800   magnesium oxide  200 mg Oral Daily   midodrine  10 mg  Oral TID WC   midodrine  10 mg Oral Q M,W,F-HD   mirtazapine  3.75 mg Oral QHS   multivitamin  1 tablet Oral QHS   polyethylene glycol  17 g Oral BID   senna-docusate  2 tablet Oral BID   sodium chloride flush  3 mL Intravenous Q12H    Dialysis Orders: MWF at [facility] 4hr, 400/500, EDW 72kg (although leaving 74kg range), 2K/2Ca, TDC (+ maturing AVF), heparin 2000 unit bolus - No ESA or VDRA  Assessment/Plan: Proctitis/ rectal ulceration/ fecal impaction: On aggressive bowel regimen.  Disimpacted by GI w/large amount of stool removed. Having good BM but continued rectal pain.  GI following - FFS on 2/10 rectal ulceration/proctitis, biopsies taken.  Liver/kidney lesions: Elevated LFTs. MRI showed benign liver cysts stable since 2019, no suspicious liver lesions, 2 small indeterminate T2 hypointense renal cortical lesions.  Dysphagia: Swallow studies normal.  ESRD:  MWF. Pt with worsening SOB Friday .  CXR yesterday with pulmonary edema. Had extra dialysis session yesterday and breathing is improved, will plan for next HD tomorrow.  Pt has been losing a substantial amount of weight and needs EDW lowered at discharge.  Hypotension/volume: Chronically low BP, on midodrine 10mg  TID. Ordered prn midodrine to be given pre HD. Continue UF as BP allows. Under EDW, will need to be lowered on d/c . Anemia: Hgb 10.2 - FOBT negative.  Aranesp 60 q Monday.  Metabolic bone disease: CorrCa was high, now improving. Phos has been elevated but is improving, increased Auryxia to 2 AC TID.  CAD/HFrEF - missed outpatient cardiology follow up.   Will need to reschedule after d/c unless need to be seen as inpatient. A-fib: On amiodarone.  Nutrition - On regular diet. RD concerned for poor intake. Prot supp for low albumin.  Follow labs.  Depression/anxiety - psych consulted.    Anice Paganini, PA-C 06/23/2021, 9:20 AM  [employer] Pager: [phone]

## 2021-06-23 NOTE — Progress Notes (Signed)
PROGRESS NOTE    Ruben [name]  JHE:[id number] DOB: [birth year] DOA: 06/07/2021 PCP: [name], FNP     Brief Narrative:  76 y.o. BM PMHx HTN, CAD, recent hospitalization for non-STEMI and acute systolic CHF with cardiogenic shock requiring ICU stay and inotrope support, A. fib with RVR, HLD, ESRD on HD M/W/F,  prior alcohol abuse, and tobacco abuse   Presents with complaints of rectal pain and difficulty swallowing over the last 2 weeks.  His most recent hospital stay was prolonged, he was here for almost a month.  He has declined since with weight loss, poor p.o. intake, dysphagia as well as increased constipation.  There was concern for acute cholecystitis on admission and general surgery was consulted.  Initial imaging showed some nonspecific liver lesions, on a noncontrast study, and also patient complained of dysphagia for which GI was consulted. Overall improving, his liver lesions are not significant. On 2/17 he was supposed to go home but became more dyspneic, underwent HD x 2 2/17 and 2/18.   Subjective: 2/19 afebrile overnight.  Patient feels very fatigued.  No complaint of rectal pain today.   Assessment & Plan: Covid vaccination;   Active Problems:   HLD (hyperlipidemia)   ESRD on hemodialysis (HCC)   Weight loss   Suspected acute cholecystitis   Rectal pain and constipation   Dysphagia   Paroxysmal atrial fibrillation (HCC)   Chronic systolic CHF (congestive heart failure) (HCC)   ETOH abuse   Normocytic anemia   Transaminitis   CAD (coronary artery disease)   Liver lesions, elevated LFTs   Anxiety and Depression    Rectal pain and constipation- (present on admission) -patient reports complaints of constipation and rectal pain for which she has been unable to have adequate bowel movement despite taking stool softeners, sitz bath's, use of Preparation H, and suppositories.  -CT scan of the abdomen pelvis did not note any signs of fecal impaction or bowel  obstruction, but appears to show stool throughout the colon.   -GI consulted, s/p manual disimpaction.  Continue aggressive bowel regimen, significantly improved, continues to have good bowel movements.   -Remains with severe rectal pain at times.   -Daughter tells me over the phone that she asked for IV Dilaudid overnight but he never received it.  Feels like tramadol does not work.   -Switch to oxycodone, understanding the risk of him becoming constipated.  Continue aggressive bowel regimen   Chronic respiratory failure with hypoxia (on  2 L O2 at home) - He developed shortness of breath 2/17 right before his scheduled HD, findings consistent with fluid overload.  Underwent HD 2/17, afterwards a chest x-ray still showed fluid overload.  Repeat HD today -2/19 back on baseline O2. -2/19 per nephrology note plan is for HD on Monday 1/85   Chronic systolic CHF (congestive heart failure) ([place])- (present on admission) -Following NSTEMI in January 2023 patient was noted to have gone into cardiogenic shock and required temporary stay in ICU requiring pressors which were able to be weaned off and he was transition to midodrine.   -EF 35-40% on echocardiogram with moderate mitral regurgitation.  -Midodrine 10 mg QAC -fluid management HD   Liver lesions, elevated LFTs - CT scan on admission showed low-attenuation lesions measuring about 12 mm diameter in the liver.  Further imaging with CT scan with IV contrast as well as MRI showing fairly benign lesions   CAD (coronary artery disease)- (present on admission) -Patient had recent NSTEMI 04/2021 for which  he underwent cardiac catheterization noting RCA occlusion with diffuse disease of the LAD and left circumflex.  It was not amendable to PCI and he was not a surgical candidate for CABG. Continue statin   Paroxysmal atrial fibrillation ([name]) --2/19 currently NSR  -Apixaban 5 mg BID   ESRD on HD on M/W/F [facility]) -He has new end-stage renal disease  following his most recent hospital stay.  He was discharged January 2023.   -Just had a RIGHT AV fistula placed, it has not matured and currently having dialysis via HD cath.   Anxiety and Depression - Psychiatry consulted, started on Remeron 7.5 mg nightly for depression, insomnia, poor appetite.   -Hydroxyzine 10 mg to use before dialysis for anxiety   Transaminitis- (present on admission) -AST was noted to be mildly elevated at 55, but lipase was noted to be within normal limits.  Possibly related with patient's prior history of abuse.   Normocytic anemia- (present on admission) -Stable.  No bleeding   ETOH abuse -Patient has not not drank alcohol since he was admitted to the hospital in December 2022. Continue to encourage abstinence from alcohol   Dysphagia -Patient complains of food and liquids getting stuck in his throat causing him to vomit it back up.  Denies complaints of coughing while eating.  He has a prior history of alcohol abuse. -Management per GI, currently advanced to regular diet.   Suspected acute cholecystitis -Patient presented with complaints of abdominal pain with nausea and vomiting on admission. -CT scan of the abdomen pelvis significant for increased density in the gallbladder concerning for milk of calcium or large gallstones with gallbladder wall thickening.   -General surgery has been consulted and evaluated patient, and they do not feel that imaging and physical exam and symptoms are consistent with acute cholecystitis.  They signed off.  He was initially placed on ceftriaxone but it has been discontinued now   Weight loss -family reports 20-30 pound weight loss over the last month.  Question if multifactorial in the recent NSTEMI, ESRD, and constipation with poor p.o. intake.  TSH unremarkable.    HLD (hyperlipidemia)- (present on admission) -Lipitor 80 mg daily       DVT prophylaxis: Eliquis 5 mg Code Status: Full Family Communication: 2/19  younger brother at bedside for discussion of plan of care all questions answered Status is: Inpatient    Dispo: The patient is from: Home              Anticipated d/c is to: Home              Anticipated d/c date is: 2 days              Patient currently is not medically stable to d/c.      Consultants:  Psychiatry Nephrology GI   Procedures/Significant Events:    I have personally reviewed and interpreted all radiology studies and my findings are as above.  VENTILATOR SETTINGS: Nasal cannula 2/19 Flow 2 L/min SPO2 99%   Cultures   Antimicrobials: Anti-infectives (From admission, onward)    Start     Ordered Stop   06/08/21 2200  cefTRIAXone (ROCEPHIN) 2 g in sodium chloride 0.9 % 100 mL IVPB  Status:  Discontinued        06/08/21 1146 06/08/21 1328   06/08/21 1300  metroNIDAZOLE (FLAGYL) IVPB 500 mg  Status:  Discontinued        06/08/21 1146 06/08/21 1328   06/07/21 2000  cefTRIAXone (ROCEPHIN) 2  g in sodium chloride 0.9 % 100 mL IVPB        06/07/21 1958 06/07/21 2134          Devices    LINES / TUBES:  Right Vas-Cath    Continuous Infusions:  promethazine (PHENERGAN) injection (IM or IVPB) Stopped (06/13/21 2334)     Objective: Vitals:   06/22/21 1800 06/22/21 1951 06/23/21 0513 06/23/21 0747  BP: (!) 99/54 (!) 100/47 (!) 97/53 (!) 93/52  Pulse: 77 79 74 75  Resp: 17 16 18 18   Temp: (!) 97.5 F (36.4 C) 98.3 F (36.8 C) 98.5 F (36.9 C) 97.8 F (36.6 C)  TempSrc: Oral Oral Oral Oral  SpO2: 93% 99% 98% 99%  Weight:      Height:        Intake/Output Summary (Last 24 hours) at 06/23/2021 1214 Last data filed at 06/23/2021 1044 Gross per 24 hour  Intake 130 ml  Output 2673 ml  Net -2543 ml   Filed Weights   06/22/21 0443 06/22/21 1252 06/22/21 1615  Weight: 67.9 kg 68 kg 65.3 kg    Examination:  General: AOx4, positive chronic respiratory distress, cachectic Eyes: negative scleral hemorrhage, negative anisocoria, negative  icterus ENT: Negative Runny nose, negative gingival bleeding, Neck:  Negative scars, masses, torticollis, lymphadenopathy, JVD Lungs: Clear to auscultation bilaterally without wheezes or crackles Cardiovascular: Regular rate and rhythm without murmur gallop or rub normal S1 and S2 Abdomen: negative abdominal pain, nondistended, positive soft, bowel sounds, no rebound, no ascites, no appreciable mass Extremities: No significant cyanosis, clubbing, or edema bilateral lower extremities Skin: Negative rashes, lesions, ulcers Psychiatric: Positive depression, negative anxiety, negative fatigue, negative mania  Central nervous system:  Cranial nerves II through XII intact, tongue/uvula midline, all extremities muscle strength 5/5, sensation intact throughout, negative dysarthria, negative expressive aphasia, negative receptive aphasia.  .     Data Reviewed: Care during the described time interval was provided by me .  I have reviewed this patient's available data, including medical history, events of note, physical examination, and all test results as part of my evaluation.  CBC: Recent Labs  Lab 06/20/21 0121 06/21/21 1435 06/22/21 1053 06/22/21 1634 06/23/21 0801  WBC 6.8 8.2 6.7 6.7 6.0  NEUTROABS  --   --   --   --  4.1  HGB 9.9* 10.2* 10.0* 9.7* 10.2*  HCT 29.8* 31.2* 31.7* 30.8* 30.8*  MCV 99.3 99.4 99.7 101.7* 98.7  PLT 145* 152 148* 141* 097*   Basic Metabolic Panel: Recent Labs  Lab 06/18/21 0209 06/19/21 0247 06/21/21 1435 06/22/21 1053 06/23/21 0801  NA 135 133* 132* 133* 134*  K 4.1 4.0 4.3 4.0 4.0  CL 96* 95* 92* 94* 93*  CO2 24 24 26 27 26   GLUCOSE 112* 107* 115* 115* 103*  BUN 44* 66* 71* 45* 60*  CREATININE 5.61* 7.25* 7.63* 6.00* 7.56*  CALCIUM 8.2* 8.8* 8.6* 8.6* 8.8*  MG  --   --   --   --  2.9*  PHOS  --  6.5* 6.0* 4.9* 5.6*   GFR: Estimated Creatinine Clearance: 7.8 mL/min (A) (by C-G formula based on SCr of 7.56 mg/dL (H)). Liver Function  Tests: Recent Labs  Lab 06/17/21 0738 06/18/21 0209 06/19/21 0247 06/21/21 1435 06/22/21 1053 06/23/21 0801  AST 111* 83*  --  35  --  30  ALT 193* 154*  --  73*  --  51*  ALKPHOS 164* 138*  --  107  --  92  BILITOT 1.3* 1.4*  --  1.1  --  1.3*  PROT 6.3* 6.0*  --  6.1*  --  6.5  ALBUMIN 3.1* 3.0* 3.0* 3.0*   3.0* 2.9* 3.2*   No results for input(s): LIPASE, AMYLASE in the last 168 hours. No results for input(s): AMMONIA in the last 168 hours. Coagulation Profile: No results for input(s): INR, PROTIME in the last 168 hours. Cardiac Enzymes: No results for input(s): CKTOTAL, CKMB, CKMBINDEX, TROPONINI in the last 168 hours. BNP (last 3 results) No results for input(s): PROBNP in the last 8760 hours. HbA1C: No results for input(s): HGBA1C in the last 72 hours. CBG: Recent Labs  Lab 06/21/21 1818 06/21/21 2356 06/22/21 0817 06/22/21 2359 06/23/21 0748  GLUCAP 107* 121* 99 131* 124*   Lipid Profile: No results for input(s): CHOL, HDL, LDLCALC, TRIG, CHOLHDL, LDLDIRECT in the last 72 hours. Thyroid Function Tests: No results for input(s): TSH, T4TOTAL, FREET4, T3FREE, THYROIDAB in the last 72 hours. Anemia Panel: No results for input(s): VITAMINB12, FOLATE, FERRITIN, TIBC, IRON, RETICCTPCT in the last 72 hours. Sepsis Labs: No results for input(s): PROCALCITON, LATICACIDVEN in the last 168 hours.  No results found for this or any previous visit (from the past 240 hour(s)).       Radiology Studies: DG CHEST PORT 1 VIEW  Result Date: 06/21/2021 CLINICAL DATA:  Dyspnea EXAM: PORTABLE CHEST 1 VIEW COMPARISON:  06/08/2021, 06/12/2021 FINDINGS: Single frontal view of the chest demonstrates a stable right internal jugular dialysis catheter. The cardiac silhouette is enlarged. Persistent right pleural effusion and right basilar consolidation. Diffuse increased interstitial prominence consistent with interstitial edema. No pneumothorax. No acute bony abnormalities. IMPRESSION:  1. Stable diffuse increased interstitial prominence compatible with interstitial edema. 2. Stable right basilar consolidation and right effusion. Electronically Signed   By: Randa Ngo M.D.   On: 06/21/2021 18:48        Scheduled Meds:  (feeding supplement) PROSource Plus  30 mL Oral TID BM   apixaban  5 mg Oral BID   bisacodyl  10 mg Rectal Daily   Chlorhexidine Gluconate Cloth  6 each Topical Q0600   darbepoetin (ARANESP) injection - DIALYSIS  60 mcg Intravenous Q Mon-HD   diltiazem   Topical BID   ferric citrate  420 mg Oral TID WC   gabapentin  100 mg Oral BID   hydrocortisone  25 mg Rectal BID   hydrOXYzine  10 mg Oral Q M,W,F-1800   magnesium oxide  200 mg Oral Daily   midodrine  10 mg Oral TID WC   midodrine  10 mg Oral Q M,W,F-HD   mirtazapine  3.75 mg Oral QHS   multivitamin  1 tablet Oral QHS   polyethylene glycol  17 g Oral BID   senna-docusate  2 tablet Oral BID   sodium chloride flush  3 mL Intravenous Q12H   Continuous Infusions:  promethazine (PHENERGAN) injection (IM or IVPB) Stopped (06/13/21 2334)     LOS: 15 days    Time spent:40 min    Deanthony Maull, Geraldo Docker, MD [facility]   If 7PM-7AM, please contact night-coverage 06/23/2021, 12:14 PM

## 2021-06-23 NOTE — Progress Notes (Signed)
Mobility Specialist Progress Note:   06/23/21 1638  Mobility  Activity Ambulated with assistance in hallway  Level of Assistance Contact guard assist, steadying assist  Assistive Device Front wheel walker  Distance Ambulated (ft) 80 ft  Activity Response Tolerated well  $Mobility charge 1 Mobility   Pt received in bed willing to participate in mobility. No complaints of pain. Left in bed with call bell in reach and all needs met  [facility] [place] Cuddeback Mobility Specialist Primary Phone [phone]

## 2021-06-24 LAB — OCCULT BLOOD X 1 CARD TO LAB, STOOL: Fecal Occult Bld: NEGATIVE

## 2021-06-24 LAB — CBC WITH DIFFERENTIAL/PLATELET
Abs Immature Granulocytes: 0.03 10*3/uL (ref 0.00–0.07)
Basophils Absolute: 0 10*3/uL (ref 0.0–0.1)
Basophils Relative: 0 %
Eosinophils Absolute: 0.1 10*3/uL (ref 0.0–0.5)
Eosinophils Relative: 2 %
HCT: 31 % — ABNORMAL LOW (ref 39.0–52.0)
Hemoglobin: 10 g/dL — ABNORMAL LOW (ref 13.0–17.0)
Immature Granulocytes: 1 %
Lymphocytes Relative: 18 %
Lymphs Abs: 1.1 10*3/uL (ref 0.7–4.0)
MCH: 31.8 pg (ref 26.0–34.0)
MCHC: 32.3 g/dL (ref 30.0–36.0)
MCV: 98.7 fL (ref 80.0–100.0)
Monocytes Absolute: 0.7 10*3/uL (ref 0.1–1.0)
Monocytes Relative: 11 %
Neutro Abs: 4.2 10*3/uL (ref 1.7–7.7)
Neutrophils Relative %: 68 %
Platelets: 143 10*3/uL — ABNORMAL LOW (ref 150–400)
RBC: 3.14 MIL/uL — ABNORMAL LOW (ref 4.22–5.81)
RDW: 19.2 % — ABNORMAL HIGH (ref 11.5–15.5)
WBC: 6.1 10*3/uL (ref 4.0–10.5)
nRBC: 0 % (ref 0.0–0.2)

## 2021-06-24 LAB — GLUCOSE, CAPILLARY
Glucose-Capillary: 115 mg/dL — ABNORMAL HIGH (ref 70–99)
Glucose-Capillary: 120 mg/dL — ABNORMAL HIGH (ref 70–99)
Glucose-Capillary: 124 mg/dL — ABNORMAL HIGH (ref 70–99)

## 2021-06-24 LAB — COMPREHENSIVE METABOLIC PANEL
ALT: 45 U/L — ABNORMAL HIGH (ref 0–44)
AST: 29 U/L (ref 15–41)
Albumin: 3.2 g/dL — ABNORMAL LOW (ref 3.5–5.0)
Alkaline Phosphatase: 89 U/L (ref 38–126)
Anion gap: 14 (ref 5–15)
BUN: 75 mg/dL — ABNORMAL HIGH (ref 8–23)
CO2: 27 mmol/L (ref 22–32)
Calcium: 9 mg/dL (ref 8.9–10.3)
Chloride: 93 mmol/L — ABNORMAL LOW (ref 98–111)
Creatinine, Ser: 9.05 mg/dL — ABNORMAL HIGH (ref 0.61–1.24)
GFR, Estimated: 6 mL/min — ABNORMAL LOW (ref >60)
Glucose, Bld: 120 mg/dL — ABNORMAL HIGH (ref 70–99)
Potassium: 4.1 mmol/L (ref 3.5–5.1)
Sodium: 134 mmol/L — ABNORMAL LOW (ref 135–145)
Total Bilirubin: 1.2 mg/dL (ref 0.3–1.2)
Total Protein: 6.5 g/dL (ref 6.5–8.1)

## 2021-06-24 LAB — PHOSPHORUS: Phosphorus: 5.4 mg/dL — ABNORMAL HIGH (ref 2.5–4.6)

## 2021-06-24 LAB — MAGNESIUM: Magnesium: 3.2 mg/dL — ABNORMAL HIGH (ref 1.7–2.4)

## 2021-06-24 MED ORDER — HEPARIN SODIUM (PORCINE) 1000 UNIT/ML IJ SOLN
INTRAMUSCULAR | Status: AC
  Administered 2021-06-24: 3200 [IU]
  Filled 2021-06-24: qty 3

## 2021-06-24 MED ORDER — ALBUMIN HUMAN 25 % IV SOLN
INTRAVENOUS | Status: AC
  Filled 2021-06-24: qty 100

## 2021-06-24 MED ORDER — ALBUMIN HUMAN 25 % IV SOLN
25.0000 g | Freq: Once | INTRAVENOUS | Status: AC
  Administered 2021-06-24: 25 g via INTRAVENOUS

## 2021-06-24 NOTE — Progress Notes (Addendum)
[facility] Progress Note   Subjective:  Seen on HD - 2.5L UFG and tolerating. No CP/dyspnea or rectal pain at the moment.  Objective Vitals:   06/24/21 0500 06/24/21 0803 06/24/21 0816 06/24/21 0830  BP:  (!) 91/53 (!) 95/54 (!) 91/59  Pulse:  71 71   Resp:  19  15  Temp:  98.6 F (37 C)    TempSrc:  Oral    SpO2:      Weight: 66 kg 64.5 kg    Height:       Physical Exam General: Elderly man, NAD. Nasal O2 in place. Heart: RRR; no murmur Lungs: CTA anteriorly, no rales Abdomen: soft, non-tender Extremities: No LE edema Dialysis Access: TDC in use, LUE AVF + thrill  Additional Objective Labs: Basic Metabolic Panel: Recent Labs  Lab 06/22/21 1053 06/23/21 0801 06/24/21 0122  NA 133* 134* 134*  K 4.0 4.0 4.1  CL 94* 93* 93*  CO2 27 26 27   GLUCOSE 115* 103* 120*  BUN 45* 60* 75*  CREATININE 6.00* 7.56* 9.05*  CALCIUM 8.6* 8.8* 9.0  PHOS 4.9* 5.6* 5.4*   Liver Function Tests: Recent Labs  Lab 06/21/21 1435 06/22/21 1053 06/23/21 0801 06/24/21 0122  AST 35  --  30 29  ALT 73*  --  51* 45*  ALKPHOS 107  --  92 89  BILITOT 1.1  --  1.3* 1.2  PROT 6.1*  --  6.5 6.5  ALBUMIN 3.0*   3.0* 2.9* 3.2* 3.2*   CBC: Recent Labs  Lab 06/21/21 1435 06/22/21 1053 06/22/21 1634 06/23/21 0801 06/24/21 0122  WBC 8.2 6.7 6.7 6.0 6.1  NEUTROABS  --   --   --  4.1 4.2  HGB 10.2* 10.0* 9.7* 10.2* 10.0*  HCT 31.2* 31.7* 30.8* 30.8* 31.0*  MCV 99.4 99.7 101.7* 98.7 98.7  PLT 152 148* 141* 147* 143*   Medications:  albumin human     promethazine (PHENERGAN) injection (IM or IVPB) Stopped (06/13/21 2334)    (feeding supplement) PROSource Plus  30 mL Oral TID BM   apixaban  5 mg Oral BID   atorvastatin  80 mg Oral Daily   bisacodyl  10 mg Rectal Daily   Chlorhexidine Gluconate Cloth  6 each Topical Q0600   darbepoetin (ARANESP) injection - DIALYSIS  60 mcg Intravenous Q Mon-HD   diltiazem   Topical BID   ferric citrate  420 mg Oral TID WC    gabapentin  100 mg Oral BID   heparin sodium (porcine)       hydrocortisone  25 mg Rectal BID   hydrOXYzine  10 mg Oral Q M,W,F-1800   magnesium oxide  200 mg Oral Daily   midodrine  10 mg Oral TID WC   midodrine  10 mg Oral Q M,W,F-HD   mirtazapine  3.75 mg Oral QHS   multivitamin  1 tablet Oral QHS   polyethylene glycol  17 g Oral BID   senna-docusate  2 tablet Oral BID   sodium chloride flush  3 mL Intravenous Q12H    Dialysis Orders: MWF at [facility] 4hr, 400/500, EDW 72kg (although leaving 74kg range), 2K/2Ca, TDC (+ maturing AVF), heparin 2000 unit bolus - No ESA or VDRA   Assessment/Plan: Proctitis/ rectal ulceration/ fecal impaction: On aggressive bowel regimen.  Disimpacted by GI w/large amount of stool removed. Having good BM but continued rectal pain.  GI following - FFS on 2/10 rectal ulceration/proctitis, biopsies showed "focal erosion." Liver/kidney lesions: Elevated LFTs.  MRI showed benign liver cysts stable since 2019, no suspicious liver lesions, 2 small indeterminate T2 hypointense renal cortical lesions.  Dysphagia: Swallow studies normal.  ESRD:  Continue HD on MWF schedule. CXR 2/17 with pulmonary edema, s/p extra HD 2/18. HD again today with 2.5L UFG as tolerated.   Hypotension/volume: Chronically low BP, on midodrine 10mg  TID. Continue UF as BP allows. Under EDW, will need to be lowered on d/c . Anemia: Hgb 10 - FOBT negative. Contineu Aranesp 14mcg q Monday.  Metabolic bone disease: CorrCa/Phos both initially high, now improving. Lorin Picket was increased to 2/meals. CAD/HFrEF - missed outpatient cardiology follow up.   Will need to reschedule after d/c unless need to be seen as inpatient. A-fib: On amiodarone.  Nutrition - On regular diet. RD concerned for poor intake. Prot supp for low albumin.  Follow labs.  Depression/anxiety - psych consulted.   Dispo - stable for d/c from renal standpoint   Veneta Penton, PA-C 06/24/2021, 8:55 AM  [name]  Pt seen, examined and agree w assess/plan as above with additions as indicated.  [facility] 06/25/2021, 2:12 PM

## 2021-06-24 NOTE — TOC Progression Note (Signed)
Transition of Care [facility]) - Progression Note    Patient Details  Name: Ruben [name] MRN: [id number] Date of Birth: 04-11-1946  Transition of Care [facility]) CM/SW Contact  Ella Bodo, RN Phone Number: 06/24/2021, 1:02 PM  Clinical Narrative:    Spoke with patient's daughter, Leonia Reeves regarding need for transport chair and new oxygen concentrator.  Per TOC CM's notes, [employer] was contacted on 06/20/21 regarding order for transport chair, and need for repair/replacement of O2 concentrator; Reba states she has not heard from anyone at [facility].  Spoke with Freda Munro at [place]; she states ticket was placed, but not picked up.  She states she will expedite orders for DME.  Home health services verified with [facility] with [facility]; patient has orders for [facility], PT, OT and SW for dc.     Expected Discharge Plan: [place] Barriers to Discharge: Continued Medical Work up  Expected Discharge Plan and Services Expected Discharge Plan: [place] Choice: Home Health   Expected Discharge Date: 06/20/21               DME Arranged: Oxygen, Other see comment (transport chair)   Date DME Agency Contacted: 06/24/21 Time DME Agency Contacted: 1300 Representative spoke with at DME Agency: Freda Munro HH Arranged: RN, PT, OT, Social Work [facility] Agency: [facility] [place] Date [place]: 06/20/21   Representative spoke with at [place]: [place] ([place]) Interventions    Readmission Risk Interventions No flowsheet data found.  Reinaldo Raddle, RN, BSN  Trauma/Neuro ICU Case Manager [phone]

## 2021-06-24 NOTE — Progress Notes (Signed)
Mobility Specialist Progress Note:   06/24/21 1609  Mobility  Activity Transferred from chair to bed  Level of Assistance Moderate assist, patient does 50-74%  Assistive Device Front wheel walker  Distance Ambulated (ft) 4 ft  Activity Response Tolerated fair  $Mobility charge 1 Mobility   Pt received in the chair wanting to go to bed. ModA to stand. Upon standing blood started dripping from rectal area. Sat pt on side of bed and called RN. Left in bed with RN and NT present.   [facility] Public librarian Phone [phone]

## 2021-06-24 NOTE — Progress Notes (Signed)
PROGRESS NOTE    Ruben [name]  DUK:[id number] DOB: 1945-12-07 DOA: 06/07/2021 PCP: [name], FNP     Brief Narrative:  76 y.o. BM PMHx HTN, CAD, recent hospitalization for non-STEMI and acute systolic CHF with cardiogenic shock requiring ICU stay and inotrope support, A. fib with RVR, HLD, ESRD on HD M/W/F,  prior alcohol abuse, and tobacco abuse   Presents with complaints of rectal pain and difficulty swallowing over the last 2 weeks.  His most recent hospital stay was prolonged, he was here for almost a month.  He has declined since with weight loss, poor p.o. intake, dysphagia as well as increased constipation.  There was concern for acute cholecystitis on admission and general surgery was consulted.  Initial imaging showed some nonspecific liver lesions, on a noncontrast study, and also patient complained of dysphagia for which GI was consulted. Overall improving, his liver lesions are not significant. On 2/17 he was supposed to go home but became more dyspneic, underwent HD x 2 2/17 and 2/18.   Subjective: 2/20  afebrile overnight     afebrile overnight.  Patient feels very fatigued.  No complaint of rectal pain today.   Assessment & Plan: Covid vaccination;   Active Problems:   HLD (hyperlipidemia)   ESRD on hemodialysis (HCC)   Weight loss   Suspected acute cholecystitis   Rectal pain and constipation   Dysphagia   Paroxysmal atrial fibrillation (HCC)   Chronic systolic CHF (congestive heart failure) (HCC)   ETOH abuse   Normocytic anemia   Transaminitis   CAD (coronary artery disease)   Liver lesions, elevated LFTs   Anxiety and Depression    Rectal pain and constipation- (present on admission) -patient reports complaints of constipation and rectal pain for which she has been unable to have adequate bowel movement despite taking stool softeners, sitz bath's, use of Preparation H, and suppositories.  -CT scan of the abdomen pelvis did not note any signs  of fecal impaction or bowel obstruction, but appears to show stool throughout the colon.   -GI consulted, s/p manual disimpaction.  Continue aggressive bowel regimen, significantly improved, continues to have good bowel movements.   -Remains with severe rectal pain at times.   -Daughter tells me over the phone that she asked for IV Dilaudid overnight but he never received it.  Feels like tramadol does not work.   -Switch to oxycodone, understanding the risk of him becoming constipated.  Continue aggressive bowel regimen   Chronic respiratory failure with hypoxia (on [place] 2 L O2 at home) - He developed shortness of breath 2/17 right before his scheduled HD, findings consistent with fluid overload.  Underwent HD 2/17, afterwards a chest x-ray still showed fluid overload.  Repeat HD today -2/19 back on baseline O2. -2/19 per nephrology note plan is for HD on Monday 2/20 SATURATION QUALIFICATIONS: (This note is used to comply with regulatory documentation for home oxygen) Patient Saturations on Room Air at Rest = 96% Patient Saturations on Room Air while Ambulating = 92%-94% Patient Saturations on n/a Liters of oxygen while Ambulating = n/a% -Patient meets criteria for home O2 - 2 L O2 via [place].  Titrate to maintain SPO2> 92% - Provide Inogen portable home O2 generator   Chronic systolic CHF (congestive heart failure) ([place])- (present on admission) -Following NSTEMI in January 2023 patient was noted to have gone into cardiogenic shock and required temporary stay in ICU requiring pressors which were able to be weaned off and he was transition  to midodrine.   -EF 35-40% on echocardiogram with moderate mitral regurgitation.  -Midodrine 10 mg QAC -fluid management HD   Liver lesions, elevated LFTs - CT scan on admission showed low-attenuation lesions measuring about 12 mm diameter in the liver.  Further imaging with CT scan with IV contrast as well as MRI showing fairly benign lesions   CAD (coronary  artery disease)- (present on admission) -Patient had recent NSTEMI 04/2021 for which he underwent cardiac catheterization noting RCA occlusion with diffuse disease of the LAD and left circumflex.  It was not amendable to PCI and he was not a surgical candidate for CABG. Continue statin   Paroxysmal atrial fibrillation ([place]) --2/19 currently NSR  -Apixaban 5 mg BID   ESRD on HD on M/W/F [facility]) -He has new end-stage renal disease following his most recent hospital stay.  He was discharged January 2023.   -Just had a RIGHT AV fistula placed, it has not matured and currently having dialysis via HD cath.   Anxiety and Depression - Psychiatry consulted, started on Remeron 7.5 mg nightly for depression, insomnia, poor appetite.   -Hydroxyzine 10 mg to use before dialysis for anxiety   Transaminitis- (present on admission) -AST was noted to be mildly elevated at 55, but lipase was noted to be within normal limits.  Possibly related with patient's prior history of abuse.   Normocytic anemia- (present on admission) Lab Results  Component Value Date   HGB 10.0 (L) 06/24/2021   HGB 10.2 (L) 06/23/2021   HGB 9.7 (L) 06/22/2021   HGB 10.0 (L) 06/22/2021   HGB 10.2 (L) 06/21/2021  -Stable.   -2/20 occult blood pending    ETOH abuse -Patient has not not drank alcohol since he was admitted to the hospital in December 2022. Continue to encourage abstinence from alcohol   Dysphagia -Patient complains of food and liquids getting stuck in his throat causing him to vomit it back up.  Denies complaints of coughing while eating.  He has a prior history of alcohol abuse. -Management per GI, currently advanced to regular diet. -2/5 Esophogram by Dr. Dayna Barker  GI (unable to find results).  Though believe was negative   Suspected acute cholecystitis -Patient presented with complaints of abdominal pain with nausea and vomiting on admission. -CT scan of the abdomen pelvis significant for increased  density in the gallbladder concerning for milk of calcium or large gallstones with gallbladder wall thickening.   -General surgery has been consulted and evaluated patient, and they do not feel that imaging and physical exam and symptoms are consistent with acute cholecystitis.  They signed off.  He was initially placed on ceftriaxone but it has been discontinued now   Weight loss -family reports 20-30 pound weight loss over the last month.  Question if multifactorial in the recent NSTEMI, ESRD, and constipation with poor p.o. intake.  TSH unremarkable.    HLD (hyperlipidemia)- (present on admission) -Lipitor 80 mg daily       DVT prophylaxis: Eliquis 5 mg Code Status: Full Family Communication: 2/19 younger brother at bedside for discussion of plan of care all questions answered Status is: Inpatient    Dispo: The patient is from: Home              Anticipated d/c is to: Home              Anticipated d/c date is: 2 days              Patient currently is  not medically stable to d/c.      Consultants:  Psychiatry Nephrology GI   Procedures/Significant Events:  2/5 Esophogram by Dr. Dayna Barker [facility] (unable to find results).  Though believe was negative 2/6 MBS: Passed test with recommendation for regular solid diet with thin liquids. 2/10 flexible sigmoidoscopy    I have personally reviewed and interpreted all radiology studies and my findings are as above.  VENTILATOR SETTINGS: Room air 2/20 SPO2 99%    Cultures   Antimicrobials: Anti-infectives (From admission, onward)    Start     Ordered Stop   06/08/21 2200  cefTRIAXone (ROCEPHIN) 2 g in sodium chloride 0.9 % 100 mL IVPB  Status:  Discontinued        06/08/21 1146 06/08/21 1328   06/08/21 1300  metroNIDAZOLE (FLAGYL) IVPB 500 mg  Status:  Discontinued        06/08/21 1146 06/08/21 1328   06/07/21 2000  cefTRIAXone (ROCEPHIN) 2 g in sodium chloride 0.9 % 100 mL IVPB        06/07/21 1958 06/07/21  2134          Devices    LINES / TUBES:  Right Vas-Cath    Continuous Infusions:  albumin human     promethazine (PHENERGAN) injection (IM or IVPB) Stopped (06/13/21 2334)     Objective: Vitals:   06/24/21 0500 06/24/21 0803 06/24/21 0816 06/24/21 0830  BP:  (!) 91/53 (!) 95/54 (!) 91/59  Pulse:  71 71   Resp:  19  15  Temp:  98.6 F (37 C)    TempSrc:  Oral    SpO2:      Weight: 66 kg 64.5 kg    Height:        Intake/Output Summary (Last 24 hours) at 06/24/2021 0851 Last data filed at 06/23/2021 2100 Gross per 24 hour  Intake 320 ml  Output --  Net 320 ml    Filed Weights   06/22/21 1615 06/24/21 0500 06/24/21 0803  Weight: 65.3 kg 66 kg 64.5 kg    Examination:  General: AOx4, positive chronic respiratory distress, cachectic Eyes: negative scleral hemorrhage, negative anisocoria, negative icterus ENT: Negative Runny nose, negative gingival bleeding, Neck:  Negative scars, masses, torticollis, lymphadenopathy, JVD Lungs: Clear to auscultation bilaterally without wheezes or crackles Cardiovascular: Regular rate and rhythm without murmur gallop or rub normal S1 and S2 Abdomen: negative abdominal pain, nondistended, positive soft, bowel sounds, no rebound, no ascites, no appreciable mass Extremities: No significant cyanosis, clubbing, or edema bilateral lower extremities Skin: Negative rashes, lesions, ulcers Psychiatric: Positive depression, negative anxiety, negative fatigue, negative mania  Central nervous system:  Cranial nerves II through XII intact, tongue/uvula midline, all extremities muscle strength 5/5, sensation intact throughout, negative dysarthria, negative expressive aphasia, negative receptive aphasia.  .     Data Reviewed: Care during the described time interval was provided by me .  I have reviewed this patient's available data, including medical history, events of note, physical examination, and all test results as part of my  evaluation.  CBC: Recent Labs  Lab 06/21/21 1435 06/22/21 1053 06/22/21 1634 06/23/21 0801 06/24/21 0122  WBC 8.2 6.7 6.7 6.0 6.1  NEUTROABS  --   --   --  4.1 4.2  HGB 10.2* 10.0* 9.7* 10.2* 10.0*  HCT 31.2* 31.7* 30.8* 30.8* 31.0*  MCV 99.4 99.7 101.7* 98.7 98.7  PLT 152 148* 141* 147* 143*    Basic Metabolic Panel: Recent Labs  Lab 06/19/21 0247 06/21/21  1435 06/22/21 1053 06/23/21 0801 06/24/21 0122  NA 133* 132* 133* 134* 134*  K 4.0 4.3 4.0 4.0 4.1  CL 95* 92* 94* 93* 93*  CO2 24 26 27 26 27   GLUCOSE 107* 115* 115* 103* 120*  BUN 66* 71* 45* 60* 75*  CREATININE 7.25* 7.63* 6.00* 7.56* 9.05*  CALCIUM 8.8* 8.6* 8.6* 8.8* 9.0  MG  --   --   --  2.9* 3.2*  PHOS 6.5* 6.0* 4.9* 5.6* 5.4*    GFR: Estimated Creatinine Clearance: 6.4 mL/min (A) (by C-G formula based on SCr of 9.05 mg/dL (H)). Liver Function Tests: Recent Labs  Lab 06/18/21 0209 06/19/21 0247 06/21/21 1435 06/22/21 1053 06/23/21 0801 06/24/21 0122  AST 83*  --  35  --  30 29  ALT 154*  --  73*  --  51* 45*  ALKPHOS 138*  --  107  --  92 89  BILITOT 1.4*  --  1.1  --  1.3* 1.2  PROT 6.0*  --  6.1*  --  6.5 6.5  ALBUMIN 3.0* 3.0* 3.0*   3.0* 2.9* 3.2* 3.2*    No results for input(s): LIPASE, AMYLASE in the last 168 hours. No results for input(s): AMMONIA in the last 168 hours. Coagulation Profile: No results for input(s): INR, PROTIME in the last 168 hours. Cardiac Enzymes: No results for input(s): CKTOTAL, CKMB, CKMBINDEX, TROPONINI in the last 168 hours. BNP (last 3 results) No results for input(s): PROBNP in the last 8760 hours. HbA1C: No results for input(s): HGBA1C in the last 72 hours. CBG: Recent Labs  Lab 06/22/21 2359 06/23/21 0748 06/23/21 1600 06/24/21 0007 06/24/21 0740  GLUCAP 131* 124* 132* 124* 115*    Lipid Profile: No results for input(s): CHOL, HDL, LDLCALC, TRIG, CHOLHDL, LDLDIRECT in the last 72 hours. Thyroid Function Tests: No results for input(s): TSH,  T4TOTAL, FREET4, T3FREE, THYROIDAB in the last 72 hours. Anemia Panel: No results for input(s): VITAMINB12, FOLATE, FERRITIN, TIBC, IRON, RETICCTPCT in the last 72 hours. Sepsis Labs: No results for input(s): PROCALCITON, LATICACIDVEN in the last 168 hours.  No results found for this or any previous visit (from the past 240 hour(s)).       Radiology Studies: No results found.      Scheduled Meds:  (feeding supplement) PROSource Plus  30 mL Oral TID BM   apixaban  5 mg Oral BID   atorvastatin  80 mg Oral Daily   bisacodyl  10 mg Rectal Daily   Chlorhexidine Gluconate Cloth  6 each Topical Q0600   darbepoetin (ARANESP) injection - DIALYSIS  60 mcg Intravenous Q Mon-HD   diltiazem   Topical BID   ferric citrate  420 mg Oral TID WC   gabapentin  100 mg Oral BID   heparin sodium (porcine)       hydrocortisone  25 mg Rectal BID   hydrOXYzine  10 mg Oral Q M,W,F-1800   magnesium oxide  200 mg Oral Daily   midodrine  10 mg Oral TID WC   midodrine  10 mg Oral Q M,W,F-HD   mirtazapine  3.75 mg Oral QHS   multivitamin  1 tablet Oral QHS   polyethylene glycol  17 g Oral BID   senna-docusate  2 tablet Oral BID   sodium chloride flush  3 mL Intravenous Q12H   Continuous Infusions:  albumin human     promethazine (PHENERGAN) injection (IM or IVPB) Stopped (06/13/21 2334)     LOS: 16 days  Time spent:40 min    Kajah Santizo, Geraldo Docker, MD [facility]   If 7PM-7AM, please contact night-coverage 06/24/2021, 8:51 AM

## 2021-06-24 NOTE — Progress Notes (Signed)
Physical Therapy Treatment Patient Details Name: Ruben [name] MRN: [id number] DOB: March 01, 1946 Today's Date: 06/24/2021   History of Present Illness Pt is 76 y.o. male admitted 06/07/21 with ongoing c/o vomiting/rectal pain. Imaging (+) for bibasilar atelectasis, lesions in liver/kidneys, possible cholecystitis, possible AVN B femoral heads. S/p flexible sigmoidoscopy 2/10 revealing mucosal ulceration, proctitis. PMH includes arthritis, ESRD on dialysis, ETOH abuse, HTN, MI    PT Comments    Patient more lethargic and weak today requiring increased Min assist for bed mobility and Mod assist for transfers with RW. Pt unable to take steps today due to weakness and Mod assist to manage walker position and cues to initiate side steps to move to recliner. Pt noted to have had black/green coffee ground like BM and Max/Total assist required to complete pericare in standing at EOB. RN notified. Pt's family present and discussed potential need for ongoing therapy at facility vs return home due to significant fatigue after dialysis. Will continue to assess and plan with pt/family for best/safest discharge. At this time recommendation updated to SNF for ST rehab to initiate bed search. Acute PT will continue to progress pt as able.    Recommendations for follow up therapy are one component of a multi-disciplinary discharge planning process, led by the attending physician.  Recommendations may be updated based on patient status, additional functional criteria and insurance authorization.  Follow Up Recommendations  Skilled nursing-short term rehab (<3 hours/day) (pt may need SNF for ST rehab due to significant fatigue after dialysis vs HHPT with 2 person assist for safety)     Assistance Recommended at Discharge Frequent or constant Supervision/Assistance  Patient can return home with the following A lot of help with walking and/or transfers;A lot of help with bathing/dressing/bathroom;Assistance with  cooking/housework;Assistance with feeding;Direct supervision/assist for medications management;Direct supervision/assist for financial management;Assist for transportation;Help with stairs or ramp for entrance   Equipment Recommendations  None recommended by PT    Recommendations for Other Services       Precautions / Restrictions Precautions Precautions: Fall Precaution Comments: falls with fatigue Restrictions Weight Bearing Restrictions: No     Mobility  Bed Mobility Overal bed mobility: Needs Assistance Bed Mobility: Supine to Sit     Supine to sit: Min assist     General bed mobility comments: pt required assist to reach for bed rail and to roll to side, pt initiated bring LE's off EOB and assist needed to fully raise trunk and steady balance at EOB.    Transfers Overall transfer level: Needs assistance Equipment used: Rolling walker (2 wheels) Transfers: Sit to/from Stand Sit to Stand: Mod assist           General transfer comment: Mod assist to power up and maintain balance standing at EOB. pt completed 2x sit<>stand at EOB and Mod assist required to manage RW position to turn and step to recliner.    Ambulation/Gait                   Stairs             Wheelchair Mobility    Modified Rankin (Stroke Patients Only)       Balance Overall balance assessment: Needs assistance Sitting-balance support: Feet supported, Bilateral upper extremity supported Sitting balance-[name] Scale: Fair     Standing balance support: Bilateral upper extremity supported, Reliant on assistive device for balance, During functional activity Standing balance-[name] Scale: Poor  Cognition Arousal/Alertness: Lethargic Behavior During Therapy: Flat affect Overall Cognitive Status: Within Functional Limits for tasks assessed                                 General Comments: pt reporting just feels tired,  appears lethargic from dialysis this AM.        Exercises      General Comments        Pertinent Vitals/Pain Pain Assessment Pain Assessment: No/denies pain    Home Living                          Prior Function            PT Goals (current goals can now be found in the care plan section) Acute Rehab PT Goals Patient Stated Goal: Pt's goal is to decrease pain PT Goal Formulation: With patient Time For Goal Achievement: 06/29/21 Potential to Achieve Goals: Good Progress towards PT goals: Progressing toward goals    Frequency    Min 3X/week      PT Plan Current plan remains appropriate    Co-evaluation              AM-PAC PT "6 Clicks" Mobility   Outcome Measure  Help needed turning from your back to your side while in a flat bed without using bedrails?: A Little Help needed moving from lying on your back to sitting on the side of a flat bed without using bedrails?: A Lot Help needed moving to and from a bed to a chair (including a wheelchair)?: A Lot Help needed standing up from a chair using your arms (e.g., wheelchair or bedside chair)?: A Lot Help needed to walk in hospital room?: A Lot Help needed climbing 3-5 steps with a railing? : Total 6 Click Score: 12    End of Session Equipment Utilized During Treatment: Gait belt;Oxygen Activity Tolerance: Patient tolerated treatment well Patient left: in chair;with call bell/phone within reach;with family/visitor present Nurse Communication: Mobility status PT Visit Diagnosis: Unsteadiness on feet (R26.81);Other abnormalities of gait and mobility (R26.89);Pain     Time: 8110-3159 PT Time Calculation (min) (ACUTE ONLY): 32 min  Charges:  $Therapeutic Activity: 23-37 mins                     Verner Mould, DPT Acute Rehabilitation Services Office [phone] Pager [phone]    Jacques Navy 06/24/2021, 4:27 PM

## 2021-06-25 ENCOUNTER — Inpatient Hospital Stay (HOSPITAL_COMMUNITY): Payer: Medicare Other

## 2021-06-25 LAB — CBC WITH DIFFERENTIAL/PLATELET
Abs Immature Granulocytes: 0.03 10*3/uL (ref 0.00–0.07)
Basophils Absolute: 0 10*3/uL (ref 0.0–0.1)
Basophils Relative: 0 %
Eosinophils Absolute: 0.1 10*3/uL (ref 0.0–0.5)
Eosinophils Relative: 2 %
HCT: 30.4 % — ABNORMAL LOW (ref 39.0–52.0)
Hemoglobin: 9.8 g/dL — ABNORMAL LOW (ref 13.0–17.0)
Immature Granulocytes: 1 %
Lymphocytes Relative: 20 %
Lymphs Abs: 1 10*3/uL (ref 0.7–4.0)
MCH: 32 pg (ref 26.0–34.0)
MCHC: 32.2 g/dL (ref 30.0–36.0)
MCV: 99.3 fL (ref 80.0–100.0)
Monocytes Absolute: 0.6 10*3/uL (ref 0.1–1.0)
Monocytes Relative: 12 %
Neutro Abs: 3.3 10*3/uL (ref 1.7–7.7)
Neutrophils Relative %: 65 %
Platelets: 133 10*3/uL — ABNORMAL LOW (ref 150–400)
RBC: 3.06 MIL/uL — ABNORMAL LOW (ref 4.22–5.81)
RDW: 18.8 % — ABNORMAL HIGH (ref 11.5–15.5)
WBC: 5.1 10*3/uL (ref 4.0–10.5)
nRBC: 0 % (ref 0.0–0.2)

## 2021-06-25 LAB — OCCULT BLOOD X 1 CARD TO LAB, STOOL: Fecal Occult Bld: NEGATIVE

## 2021-06-25 LAB — COMPREHENSIVE METABOLIC PANEL
ALT: 37 U/L (ref 0–44)
AST: 27 U/L (ref 15–41)
Albumin: 3.4 g/dL — ABNORMAL LOW (ref 3.5–5.0)
Alkaline Phosphatase: 86 U/L (ref 38–126)
Anion gap: 11 (ref 5–15)
BUN: 36 mg/dL — ABNORMAL HIGH (ref 8–23)
CO2: 29 mmol/L (ref 22–32)
Calcium: 8.9 mg/dL (ref 8.9–10.3)
Chloride: 94 mmol/L — ABNORMAL LOW (ref 98–111)
Creatinine, Ser: 5.52 mg/dL — ABNORMAL HIGH (ref 0.61–1.24)
GFR, Estimated: 10 mL/min — ABNORMAL LOW (ref >60)
Glucose, Bld: 113 mg/dL — ABNORMAL HIGH (ref 70–99)
Potassium: 3.6 mmol/L (ref 3.5–5.1)
Sodium: 134 mmol/L — ABNORMAL LOW (ref 135–145)
Total Bilirubin: 1.6 mg/dL — ABNORMAL HIGH (ref 0.3–1.2)
Total Protein: 6.5 g/dL (ref 6.5–8.1)

## 2021-06-25 LAB — GLUCOSE, CAPILLARY
Glucose-Capillary: 113 mg/dL — ABNORMAL HIGH (ref 70–99)
Glucose-Capillary: 140 mg/dL — ABNORMAL HIGH (ref 70–99)
Glucose-Capillary: 143 mg/dL — ABNORMAL HIGH (ref 70–99)
Glucose-Capillary: 166 mg/dL — ABNORMAL HIGH (ref 70–99)

## 2021-06-25 LAB — MAGNESIUM: Magnesium: 2.7 mg/dL — ABNORMAL HIGH (ref 1.7–2.4)

## 2021-06-25 LAB — PHOSPHORUS: Phosphorus: 4.1 mg/dL (ref 2.5–4.6)

## 2021-06-25 MED ORDER — CHLORHEXIDINE GLUCONATE CLOTH 2 % EX PADS
6.0000 | MEDICATED_PAD | Freq: Every day | CUTANEOUS | Status: DC
  Administered 2021-06-26: 6 via TOPICAL

## 2021-06-25 NOTE — TOC Progression Note (Signed)
Transition of Care [facility]) - Progression Note    Patient Details  Name: Knight Oelkers MRN: [id number] Date of Birth: [birth year]  Transition of Care [facility]) CM/SW Contact  Emeterio Reeve, [place] Phone Number: 06/25/2021, 5:00 PM  Clinical Narrative:     CSW met with pt and daughter at bedside. CSW explained SNF process and gave medicare.gov list. CSW informed family that she will fax out to facilities and update them on who made an offer. Daughter stated they prefer [facility] and its close to home and his HD center.   CSW will follow.  Expected Discharge Plan: [place] Barriers to Discharge: Continued Medical Work up  Expected Discharge Plan and Services Expected Discharge Plan: [place] Choice: Home Health   Expected Discharge Date: 06/20/21               DME Arranged: Oxygen, Other see comment (transport chair)   Date DME Agency Contacted: 06/24/21 Time DME Agency Contacted: 1300 Representative spoke with at DME Agency: Freda Munro HH Arranged: RN, PT, OT, Social Work [facility] Agency: [facility] [place] Date [place]: 06/20/21   Representative spoke with at [place]: [place] ([place]) Interventions    Readmission Risk Interventions No flowsheet data found.  Emeterio Reeve, LCSW Clinical Social Worker

## 2021-06-25 NOTE — Progress Notes (Signed)
[facility] Progress Note   Subjective:  Seen in room - sleepy today, but otherwise without complaints. No CP/dyspnea.   Objective Vitals:   06/24/21 2119 06/25/21 0500 06/25/21 0540 06/25/21 0802  BP: (!) 94/48   (!) 99/54  Pulse: 73  66 75  Resp: 20  20 16   Temp: 98.5 F (36.9 C)  97.7 F (36.5 C) 97.8 F (36.6 C)  TempSrc: Oral  Oral Oral  SpO2: 98%  96% 97%  Weight:  62.8 kg    Height:       Physical Exam General: Elderly man, NAD. Room air. Heart: RRR; no murmur Lungs: CTA anteriorly, no rales Abdomen: soft, non-tender Extremities: No LE edema Dialysis Access: TDC, LUE AVF + thrill  Additional Objective Labs: Basic Metabolic Panel: Recent Labs  Lab 06/23/21 0801 06/24/21 0122 06/25/21 0244  NA 134* 134* 134*  K 4.0 4.1 3.6  CL 93* 93* 94*  CO2 26 27 29   GLUCOSE 103* 120* 113*  BUN 60* 75* 36*  CREATININE 7.56* 9.05* 5.52*  CALCIUM 8.8* 9.0 8.9  PHOS 5.6* 5.4* 4.1   Liver Function Tests: Recent Labs  Lab 06/23/21 0801 06/24/21 0122 06/25/21 0244  AST 30 29 27   ALT 51* 45* 37  ALKPHOS 92 89 86  BILITOT 1.3* 1.2 1.6*  PROT 6.5 6.5 6.5  ALBUMIN 3.2* 3.2* 3.4*    CBC: Recent Labs  Lab 06/22/21 1053 06/22/21 1634 06/23/21 0801 06/24/21 0122 06/25/21 0244  WBC 6.7 6.7 6.0 6.1 5.1  NEUTROABS  --   --  4.1 4.2 3.3  HGB 10.0* 9.7* 10.2* 10.0* 9.8*  HCT 31.7* 30.8* 30.8* 31.0* 30.4*  MCV 99.7 101.7* 98.7 98.7 99.3  PLT 148* 141* 147* 143* 133*   Medications:  promethazine (PHENERGAN) injection (IM or IVPB) Stopped (06/13/21 2334)    (feeding supplement) PROSource Plus  30 mL Oral TID BM   apixaban  5 mg Oral BID   atorvastatin  80 mg Oral Daily   bisacodyl  10 mg Rectal Daily   Chlorhexidine Gluconate Cloth  6 each Topical Q0600   darbepoetin (ARANESP) injection - DIALYSIS  60 mcg Intravenous Q Mon-HD   diltiazem   Topical BID   ferric citrate  420 mg Oral TID WC   gabapentin  100 mg Oral BID   hydrocortisone  25 mg  Rectal BID   hydrOXYzine  10 mg Oral Q M,W,F-1800   magnesium oxide  200 mg Oral Daily   midodrine  10 mg Oral TID WC   midodrine  10 mg Oral Q M,W,F-HD   mirtazapine  3.75 mg Oral QHS   multivitamin  1 tablet Oral QHS   polyethylene glycol  17 g Oral BID   senna-docusate  2 tablet Oral BID   sodium chloride flush  3 mL Intravenous Q12H    Dialysis Orders: MWF at [facility] 4hr, 400/500, EDW 72kg (although leaving 74kg range), 2K/2Ca, TDC (+ maturing AVF), heparin 2000 unit bolus - No ESA or VDRA   Assessment/Plan: Proctitis/ rectal ulceration/ fecal impaction: On aggressive bowel regimen.  Disimpacted by GI w/large amount of stool removed. Having good BM but continued rectal pain.  GI following - Flex Sig on 2/10 rectal ulceration/proctitis, biopsies showed "focal erosion." Liver/kidney lesions: Elevated LFTs. MRI showed benign liver cysts stable since 2019, no suspicious liver lesions, 2 small indeterminate T2 hypointense renal cortical lesions.  Dysphagia: Swallow studies normal.  ESRD:  Continue HD on MWF schedule. CXR 2/17 with pulmonary edema, s/p  extra HD 2/18. Back to usual  MWF schedule - next HD tomorrow. Hypotension/volume: Chronically low BP, on midodrine 10mg  TID. Continue UF as BP allows. Under EDW, will need to be lowered on d/c . Anemia: Hgb 9.8 - FOBT negative. Continue Aranesp 70mcg q Monday.  Metabolic bone disease: CorrCa/Phos both initially high, now improving. Lorin Picket was increased to 2/meals. CAD/HFrEF - missed outpatient cardiology follow up.  Will need to reschedule after d/c unless need to be seen as inpatient. A-fib: On amiodarone.  Nutrition - On regular diet. RD concerned for poor intake. Prot supp for low albumin.  Follow labs.  Depression/anxiety - psych consulted, started mirtazapine/hydroxyzine.    Veneta Penton, PA-C 06/25/2021, 11:12 AM  [employer]

## 2021-06-25 NOTE — Progress Notes (Signed)
Pt receives out-pt HD at [facility] ([facility]) on MWF. Pt arrives at 10:40 for 11:00 chair time. Will assist as needed.   Melven Sartorius Renal Navigator [phone]

## 2021-06-25 NOTE — Progress Notes (Signed)
PROGRESS NOTE    Ruben [name]  ZWC:[id number] DOB: May 19, 1945 DOA: 06/07/2021 PCP: [name], FNP     Brief Narrative:  76 y.o. BM PMHx HTN, CAD, recent hospitalization for non-STEMI and acute systolic CHF with cardiogenic shock requiring ICU stay and inotrope support, A. fib with RVR, HLD, ESRD on HD M/W/F,  prior alcohol abuse, and tobacco abuse   Presents with complaints of rectal pain and difficulty swallowing over the last 2 weeks.  His most recent hospital stay was prolonged, he was here for almost a month.  He has declined since with weight loss, poor p.o. intake, dysphagia as well as increased constipation.  There was concern for acute cholecystitis on admission and general surgery was consulted.  Initial imaging showed some nonspecific liver lesions, on a noncontrast study, and also patient complained of dysphagia for which GI was consulted. Overall improving, his liver lesions are not significant. On 2/17 he was supposed to go home but became more dyspneic, underwent HD x 2 2/17 and 2/18.   Subjective: 2/21 A/O x4 states still feels fatigued.  Currently also feels like he may be constipated.  Also having feeling of tenesmus    Assessment & Plan: Covid vaccination;   Active Problems:   HLD (hyperlipidemia)   ESRD on hemodialysis (HCC)   Weight loss   Suspected acute cholecystitis   Rectal pain and constipation   Dysphagia   Paroxysmal atrial fibrillation (HCC)   Chronic systolic CHF (congestive heart failure) (HCC)   ETOH abuse   Normocytic anemia   Transaminitis   CAD (coronary artery disease)   Liver lesions, elevated LFTs   Anxiety and Depression    Rectal pain and constipation- (present on admission) -patient reports complaints of constipation and rectal pain for which she has been unable to have adequate bowel movement despite taking stool softeners, sitz bath's, use of Preparation H, and suppositories.  -CT scan of the abdomen pelvis did not note  any signs of fecal impaction or bowel obstruction, but appears to show stool throughout the colon.   -GI consulted, s/p manual disimpaction.  Continue aggressive bowel regimen, significantly improved, continues to have good bowel movements.   -Remains with severe rectal pain at times.   -Daughter tells me over the phone that she asked for IV Dilaudid overnight but he never received it.  Feels like tramadol does not work.   -Switch to oxycodone, understanding the risk of him becoming constipated.  Continue aggressive bowel regimen -2/21 patient has feeling of constipation (unlikely): Given his presenting symptoms however will obtain KUB   Chronic respiratory failure with hypoxia (on [place] 2 L O2 at home) - He developed shortness of breath 2/17 right before his scheduled HD, findings consistent with fluid overload.  Underwent HD 2/17, afterwards a chest x-ray still showed fluid overload.  Repeat HD today -2/19 back on baseline O2. -2/19 per nephrology note plan is for HD on Monday 2/20 SATURATION QUALIFICATIONS: (This note is used to comply with regulatory documentation for home oxygen) Patient Saturations on Room Air at Rest = 96% Patient Saturations on Room Air while Ambulating = 92%-94% Patient Saturations on n/a Liters of oxygen while Ambulating = n/a% -Patient meets criteria for home O2 - 2 L O2 via .  Titrate to maintain SPO2> 92% - Provide Inogen portable home O2 generator   Chronic systolic CHF (congestive heart failure) ([place])- (present on admission) -Following NSTEMI in January 2023 patient was noted to have gone into cardiogenic shock and required temporary  stay in ICU requiring pressors which were able to be weaned off and he was transition to midodrine.   -EF 35-40% on echocardiogram with moderate mitral regurgitation.  -Midodrine 10 mg QAC -fluid management HD   Liver lesions, elevated LFTs - CT scan on admission showed low-attenuation lesions measuring about 12 mm diameter in the  liver.  Further imaging with CT scan with IV contrast as well as MRI showing fairly benign lesions   CAD (coronary artery disease)- (present on admission) -Patient had recent NSTEMI 04/2021 for which he underwent cardiac catheterization noting RCA occlusion with diffuse disease of the LAD and left circumflex.  It was not amendable to PCI and he was not a surgical candidate for CABG. Continue statin   Paroxysmal atrial fibrillation ([place]) --2/19 currently NSR  -Apixaban 5 mg BID   ESRD on HD on M/W/F [facility]) -He has new end-stage renal disease following his most recent hospital stay.  He was discharged January 2023.   -Just had a RIGHT AV fistula placed, it has not matured and currently having dialysis via HD cath.   Anxiety and Depression - Psychiatry consulted, started on Remeron 7.5 mg nightly for depression, insomnia, poor appetite.   -Hydroxyzine 10 mg to use before dialysis for anxiety   Transaminitis- (present on admission) -AST was noted to be mildly elevated at 55, but lipase was noted to be within normal limits.  Possibly related with patient's prior history of abuse.   Normocytic anemia- (present on admission) Lab Results  Component Value Date   HGB 9.8 (L) 06/25/2021   HGB 10.0 (L) 06/24/2021   HGB 10.2 (L) 06/23/2021   HGB 9.7 (L) 06/22/2021   HGB 10.0 (L) 06/22/2021  -Stable.    Latest Reference Range & Units 06/24/21 16:55 06/25/21 01:02  Fecal Occult Blood, POC NEGATIVE  NEGATIVE NEGATIVE      ETOH abuse -Patient has not not drank alcohol since he was admitted to the hospital in December 2022. Continue to encourage abstinence from alcohol   Dysphagia -Patient complains of food and liquids getting stuck in his throat causing him to vomit it back up.  Denies complaints of coughing while eating.  He has a prior history of alcohol abuse. -Management per GI, currently advanced to regular diet. -2/5 Esophogram by Dr. Dayna Barker [facility] (unable to find results).   Though believe was negative   Suspected acute cholecystitis -Patient presented with complaints of abdominal pain with nausea and vomiting on admission. -CT scan of the abdomen pelvis significant for increased density in the gallbladder concerning for milk of calcium or large gallstones with gallbladder wall thickening.   -General surgery has been consulted and evaluated patient, and they do not feel that imaging and physical exam and symptoms are consistent with acute cholecystitis.  They signed off.  He was initially placed on ceftriaxone but it has been discontinued now   Weight loss -family reports 20-30 pound weight loss over the last month.  Question if multifactorial in the recent NSTEMI, ESRD, and constipation with poor p.o. intake.  TSH unremarkable.    HLD (hyperlipidemia)- (present on admission) -Lipitor 80 mg daily       DVT prophylaxis: Eliquis 5 mg Code Status: Full Family Communication: 2/20 daughter, wife at bedside for discussion of plan of care all questions answered Status is: Inpatient    Dispo: The patient is from: Home              Anticipated d/c is to: Home  Anticipated d/c date is: 2 days              Patient currently is not medically stable to d/c.      Consultants:  Psychiatry Nephrology GI   Procedures/Significant Events:  2/5 Esophogram by Dr. Dayna Barker [facility] (unable to find results).  Though believe was negative 2/6 MBS: Passed test with recommendation for regular solid diet with thin liquids. 2/10 flexible sigmoidoscopy    I have personally reviewed and interpreted all radiology studies and my findings are as above.  VENTILATOR SETTINGS: Room air 2/21 SPO2 99%    Cultures   Antimicrobials: Anti-infectives (From admission, onward)    Start     Ordered Stop   06/08/21 2200  cefTRIAXone (ROCEPHIN) 2 g in sodium chloride 0.9 % 100 mL IVPB  Status:  Discontinued        06/08/21 1146 06/08/21 1328   06/08/21  1300  metroNIDAZOLE (FLAGYL) IVPB 500 mg  Status:  Discontinued        06/08/21 1146 06/08/21 1328   06/07/21 2000  cefTRIAXone (ROCEPHIN) 2 g in sodium chloride 0.9 % 100 mL IVPB        06/07/21 1958 06/07/21 2134          Devices    LINES / TUBES:  Right Vas-Cath    Continuous Infusions:  promethazine (PHENERGAN) injection (IM or IVPB) Stopped (06/13/21 2334)     Objective: Vitals:   06/24/21 2119 06/25/21 0500 06/25/21 0540 06/25/21 0802  BP: (!) 94/48   (!) 99/54  Pulse: 73  66 75  Resp: 20  20 16   Temp: 98.5 F (36.9 C)  97.7 F (36.5 C) 97.8 F (36.6 C)  TempSrc: Oral  Oral Oral  SpO2: 98%  96% 97%  Weight:  62.8 kg    Height:        Intake/Output Summary (Last 24 hours) at 06/25/2021 1359 Last data filed at 06/25/2021 0941 Gross per 24 hour  Intake 360 ml  Output --  Net 360 ml    Filed Weights   06/24/21 0803 06/24/21 1225 06/25/21 0500  Weight: 64.5 kg 62.5 kg 62.8 kg    Examination:  General: AOx4, positive chronic respiratory distress, cachectic Eyes: negative scleral hemorrhage, negative anisocoria, negative icterus ENT: Negative Runny nose, negative gingival bleeding, Neck:  Negative scars, masses, torticollis, lymphadenopathy, JVD Lungs: Clear to auscultation bilaterally without wheezes or crackles Cardiovascular: Regular rate and rhythm without murmur gallop or rub normal S1 and S2 Abdomen: negative abdominal pain, nondistended, positive soft, bowel sounds, no rebound, no ascites, no appreciable mass Extremities: No significant cyanosis, clubbing, or edema bilateral lower extremities Skin: Negative rashes, lesions, ulcers Psychiatric: Positive depression, negative anxiety, negative fatigue, negative mania  Central nervous system:  Cranial nerves II through XII intact, tongue/uvula midline, all extremities muscle strength 5/5, sensation intact throughout, negative dysarthria, negative expressive aphasia, negative receptive aphasia.  .      Data Reviewed: Care during the described time interval was provided by me .  I have reviewed this patient's available data, including medical history, events of note, physical examination, and all test results as part of my evaluation.  CBC: Recent Labs  Lab 06/22/21 1053 06/22/21 1634 06/23/21 0801 06/24/21 0122 06/25/21 0244  WBC 6.7 6.7 6.0 6.1 5.1  NEUTROABS  --   --  4.1 4.2 3.3  HGB 10.0* 9.7* 10.2* 10.0* 9.8*  HCT 31.7* 30.8* 30.8* 31.0* 30.4*  MCV 99.7 101.7* 98.7 98.7 99.3  PLT  148* 141* 147* 143* 133*    Basic Metabolic Panel: Recent Labs  Lab 06/21/21 1435 06/22/21 1053 06/23/21 0801 06/24/21 0122 06/25/21 0244  NA 132* 133* 134* 134* 134*  K 4.3 4.0 4.0 4.1 3.6  CL 92* 94* 93* 93* 94*  CO2 26 27 26 27 29   GLUCOSE 115* 115* 103* 120* 113*  BUN 71* 45* 60* 75* 36*  CREATININE 7.63* 6.00* 7.56* 9.05* 5.52*  CALCIUM 8.6* 8.6* 8.8* 9.0 8.9  MG  --   --  2.9* 3.2* 2.7*  PHOS 6.0* 4.9* 5.6* 5.4* 4.1    GFR: Estimated Creatinine Clearance: 10.3 mL/min (A) (by C-G formula based on SCr of 5.52 mg/dL (H)). Liver Function Tests: Recent Labs  Lab 06/21/21 1435 06/22/21 1053 06/23/21 0801 06/24/21 0122 06/25/21 0244  AST 35  --  30 29 27   ALT 73*  --  51* 45* 37  ALKPHOS 107  --  92 89 86  BILITOT 1.1  --  1.3* 1.2 1.6*  PROT 6.1*  --  6.5 6.5 6.5  ALBUMIN 3.0*   3.0* 2.9* 3.2* 3.2* 3.4*    No results for input(s): LIPASE, AMYLASE in the last 168 hours. No results for input(s): AMMONIA in the last 168 hours. Coagulation Profile: No results for input(s): INR, PROTIME in the last 168 hours. Cardiac Enzymes: No results for input(s): CKTOTAL, CKMB, CKMBINDEX, TROPONINI in the last 168 hours. BNP (last 3 results) No results for input(s): PROBNP in the last 8760 hours. HbA1C: No results for input(s): HGBA1C in the last 72 hours. CBG: Recent Labs  Lab 06/24/21 0740 06/24/21 1612 06/25/21 0050 06/25/21 0803 06/25/21 1141  GLUCAP 115* 120* 113*  143* 166*    Lipid Profile: No results for input(s): CHOL, HDL, LDLCALC, TRIG, CHOLHDL, LDLDIRECT in the last 72 hours. Thyroid Function Tests: No results for input(s): TSH, T4TOTAL, FREET4, T3FREE, THYROIDAB in the last 72 hours. Anemia Panel: No results for input(s): VITAMINB12, FOLATE, FERRITIN, TIBC, IRON, RETICCTPCT in the last 72 hours. Sepsis Labs: No results for input(s): PROCALCITON, LATICACIDVEN in the last 168 hours.  No results found for this or any previous visit (from the past 240 hour(s)).       Radiology Studies: No results found.      Scheduled Meds:  (feeding supplement) PROSource Plus  30 mL Oral TID BM   apixaban  5 mg Oral BID   atorvastatin  80 mg Oral Daily   bisacodyl  10 mg Rectal Daily   [START ON 06/26/2021] Chlorhexidine Gluconate Cloth  6 each Topical Q0600   darbepoetin (ARANESP) injection - DIALYSIS  60 mcg Intravenous Q Mon-HD   diltiazem   Topical BID   ferric citrate  420 mg Oral TID WC   gabapentin  100 mg Oral BID   hydrocortisone  25 mg Rectal BID   hydrOXYzine  10 mg Oral Q M,W,F-1800   magnesium oxide  200 mg Oral Daily   midodrine  10 mg Oral TID WC   midodrine  10 mg Oral Q M,W,F-HD   mirtazapine  3.75 mg Oral QHS   multivitamin  1 tablet Oral QHS   polyethylene glycol  17 g Oral BID   senna-docusate  2 tablet Oral BID   sodium chloride flush  3 mL Intravenous Q12H   Continuous Infusions:  promethazine (PHENERGAN) injection (IM or IVPB) Stopped (06/13/21 2334)     LOS: 17 days    Time spent:40 min    Melynda Krzywicki, Geraldo Docker, MD [facility]  If 7PM-7AM, please contact night-coverage 06/25/2021, 1:59 PM

## 2021-06-25 NOTE — TOC Transition Note (Signed)
Transition of Care [facility]) - CM/SW Discharge Note   Patient Details  Name: Ruben [name] MRN: [id number] Date of Birth: 1945/07/25  [facility] CM/SW Contact:  Marilu Favre, RN Phone Number: 06/25/2021, 9:57 AM   Clinical Narrative:     Followed up with Freda Munro with [facility] regarding  transport chair and checking patient's home oxygen concentrator.   Per [facility] will call Reba today regarding  transport chair and new oxygen concentrator.   Home health resumption of care confirmed with [facility]   Final next level of care: [place] Barriers to Discharge: Continued Medical Work up   Patient Goals and CMS Choice     Choice offered to / list presented to : Patient  Discharge Placement                       Discharge Plan and Services     Post Acute Care Choice: Home Health          DME Arranged: Oxygen, Other see comment (transport chair)   Date DME Agency Contacted: 06/24/21 Time DME Agency Contacted: 1300 Representative spoke with at DME Agency: Freda Munro HH Arranged: RN, PT, OT, Social [facility] Agency: [facility] Date [place]: 06/20/21   Representative spoke with at [place]: [place] ([place]) Interventions     Readmission Risk Interventions No flowsheet data found.

## 2021-06-25 NOTE — Progress Notes (Signed)
Occupational Therapy Treatment Patient Details Name: Ruben [name] MRN: [id number] DOB: May 25, 1945 Today's Date: 06/25/2021   History of present illness Pt is 76 y.o. male admitted 06/07/21 with ongoing c/o vomiting/rectal pain. Imaging (+) for bibasilar atelectasis, lesions in liver/kidneys, possible cholecystitis, possible AVN B femoral heads. S/p flexible sigmoidoscopy 2/10 revealing mucosal ulceration, proctitis. PMH includes arthritis, ESRD on dialysis, ETOH abuse, HTN, MI   OT comments  Pt making slow progress towards goals this session, feeling fatigued due to having IV pain medicine prior to session. Pt min guard with RW for transfers, and bed mobility. Able to perform grooming task seated at sink due to fatigue with min-mod A. Pt not wearing O2 upon arrival, SpO2 86%, however VSS on 2L O2 via [place] during session. Pt presenting with impairments listed below, will follow acutely. Continue to recommend HHOT at d/c.   Recommendations for follow up therapy are one component of a multi-disciplinary discharge planning process, led by the attending physician.  Recommendations may be updated based on patient status, additional functional criteria and insurance authorization.    Follow Up Recommendations  Home health OT    Assistance Recommended at Discharge Frequent or constant Supervision/Assistance  Patient can return home with the following  A little help with walking and/or transfers;Help with stairs or ramp for entrance;Assistance with cooking/housework;A lot of help with bathing/dressing/bathroom   Equipment Recommendations  BSC/3in1;Tub/shower seat;Wheelchair (measurements OT);Wheelchair cushion (measurements OT)    Recommendations for Other Services      Precautions / Restrictions Precautions Precautions: Fall Precaution Comments: falls with fatigue Restrictions Weight Bearing Restrictions: No       Mobility Bed Mobility Overal bed mobility: Needs Assistance Bed  Mobility: Supine to Sit     Supine to sit: Min guard Sit to supine: Min guard        Transfers Overall transfer level: Needs assistance Equipment used: Rolling walker (2 wheels) Transfers: Sit to/from Stand Sit to Stand: Min guard                 Balance Overall balance assessment: Needs assistance Sitting-balance support: Feet supported, Bilateral upper extremity supported Sitting balance-[name] Scale: Fair Sitting balance - Comments: reaches outside BOS does not lose balance   Standing balance support: Bilateral upper extremity supported, Reliant on assistive device for balance, During functional activity Standing balance-[name] Scale: Poor                             ADL either performed or assessed with clinical judgement   ADL       Grooming: Oral care;Wash/dry face;Sitting;Minimal assistance;Moderate assistance Grooming Details (indicate cue type and reason): sitting due to fatigue                 Toilet Transfer: Min guard;Rolling walker (2 wheels);Regular Toilet;Ambulation Toilet Transfer Details (indicate cue type and reason): simulated in room         Functional mobility during ADLs: Min guard;Rolling walker (2 wheels)      Extremity/Trunk Assessment Upper Extremity Assessment Upper Extremity Assessment: Generalized weakness   Lower Extremity Assessment Lower Extremity Assessment: Defer to PT evaluation        Vision   Vision Assessment?: No apparent visual deficits   Perception Perception Perception: Not tested   Praxis Praxis Praxis: Not tested    Cognition Arousal/Alertness: Lethargic Behavior During Therapy: Flat affect Overall Cognitive Status: Within Functional Limits for tasks assessed  General Comments: feeling drowsy, pt's family member reports he just had IV pain meds        Exercises      Shoulder Instructions       General Comments Pt not wearing O2  upon arrival, SpO2 86% increased to 90's with 2L O2 during session    Pertinent Vitals/ Pain       Pain Assessment Pain Assessment: No/denies pain  Home Living                                          Prior Functioning/Environment              Frequency  Min 2X/week        Progress Toward Goals  OT Goals(current goals can now be found in the care plan section)  Progress towards OT goals: Progressing toward goals  Acute Rehab OT Goals Patient Stated Goal: none stated OT Goal Formulation: With patient Time For Goal Achievement: 06/29/21 Potential to Achieve Goals: Good ADL Goals Pt Will Perform Upper Body Dressing: with min assist;sitting Pt Will Perform Lower Body Dressing: with mod assist;with min assist;sit to/from stand Pt Will Transfer to Toilet: bedside commode;squat pivot transfer;with supervision  Plan Discharge plan remains appropriate;Frequency remains appropriate    Co-evaluation                 AM-PAC OT "6 Clicks" Daily Activity     Outcome Measure   Help from another person eating meals?: None Help from another person taking care of personal grooming?: A Little Help from another person toileting, which includes using toliet, bedpan, or urinal?: A Little Help from another person bathing (including washing, rinsing, drying)?: A Lot Help from another person to put on and taking off regular upper body clothing?: A Little Help from another person to put on and taking off regular lower body clothing?: A Little 6 Click Score: 18    End of Session Equipment Utilized During Treatment: Gait belt;Rolling walker (2 wheels);Oxygen  OT Visit Diagnosis: Unsteadiness on feet (R26.81);Other abnormalities of gait and mobility (R26.89);Muscle weakness (generalized) (M62.81);Pain   Activity Tolerance Patient limited by fatigue   Patient Left in bed;with call bell/phone within reach;with bed alarm set;with family/visitor present   Nurse  Communication Mobility status        Time: 0034-9179 OT Time Calculation (min): 22 min  Charges: OT General Charges $OT Visit: 1 Visit OT Treatments $Self Care/Home Management : 8-22 mins  Lynnda Child, OTD, OTR/L Acute Rehab [phone] [place] 06/25/2021, 5:17 PM

## 2021-06-26 LAB — RESP PANEL BY RT-PCR (FLU A&B, COVID) ARPGX2
Influenza A by PCR: NEGATIVE
Influenza B by PCR: NEGATIVE
SARS Coronavirus 2 by RT PCR: NEGATIVE

## 2021-06-26 LAB — COMPREHENSIVE METABOLIC PANEL
ALT: 34 U/L (ref 0–44)
AST: 31 U/L (ref 15–41)
Albumin: 3.3 g/dL — ABNORMAL LOW (ref 3.5–5.0)
Alkaline Phosphatase: 83 U/L (ref 38–126)
Anion gap: 14 (ref 5–15)
BUN: 58 mg/dL — ABNORMAL HIGH (ref 8–23)
CO2: 25 mmol/L (ref 22–32)
Calcium: 9.2 mg/dL (ref 8.9–10.3)
Chloride: 93 mmol/L — ABNORMAL LOW (ref 98–111)
Creatinine, Ser: 7.36 mg/dL — ABNORMAL HIGH (ref 0.61–1.24)
GFR, Estimated: 7 mL/min — ABNORMAL LOW (ref >60)
Glucose, Bld: 117 mg/dL — ABNORMAL HIGH (ref 70–99)
Potassium: 4 mmol/L (ref 3.5–5.1)
Sodium: 132 mmol/L — ABNORMAL LOW (ref 135–145)
Total Bilirubin: 1.2 mg/dL (ref 0.3–1.2)
Total Protein: 6.8 g/dL (ref 6.5–8.1)

## 2021-06-26 LAB — CBC WITH DIFFERENTIAL/PLATELET
Abs Immature Granulocytes: 0.02 10*3/uL (ref 0.00–0.07)
Basophils Absolute: 0 10*3/uL (ref 0.0–0.1)
Basophils Relative: 0 %
Eosinophils Absolute: 0.2 10*3/uL (ref 0.0–0.5)
Eosinophils Relative: 3 %
HCT: 31.7 % — ABNORMAL LOW (ref 39.0–52.0)
Hemoglobin: 10.1 g/dL — ABNORMAL LOW (ref 13.0–17.0)
Immature Granulocytes: 0 %
Lymphocytes Relative: 14 %
Lymphs Abs: 0.8 10*3/uL (ref 0.7–4.0)
MCH: 31.5 pg (ref 26.0–34.0)
MCHC: 31.9 g/dL (ref 30.0–36.0)
MCV: 98.8 fL (ref 80.0–100.0)
Monocytes Absolute: 0.6 10*3/uL (ref 0.1–1.0)
Monocytes Relative: 9 %
Neutro Abs: 4.4 10*3/uL (ref 1.7–7.7)
Neutrophils Relative %: 74 %
Platelets: 144 10*3/uL — ABNORMAL LOW (ref 150–400)
RBC: 3.21 MIL/uL — ABNORMAL LOW (ref 4.22–5.81)
RDW: 18.6 % — ABNORMAL HIGH (ref 11.5–15.5)
WBC: 6 10*3/uL (ref 4.0–10.5)
nRBC: 0 % (ref 0.0–0.2)

## 2021-06-26 LAB — GLUCOSE, CAPILLARY
Glucose-Capillary: 116 mg/dL — ABNORMAL HIGH (ref 70–99)
Glucose-Capillary: 120 mg/dL — ABNORMAL HIGH (ref 70–99)
Glucose-Capillary: 124 mg/dL — ABNORMAL HIGH (ref 70–99)
Glucose-Capillary: 127 mg/dL — ABNORMAL HIGH (ref 70–99)
Glucose-Capillary: 129 mg/dL — ABNORMAL HIGH (ref 70–99)

## 2021-06-26 LAB — PHOSPHORUS: Phosphorus: 3.9 mg/dL (ref 2.5–4.6)

## 2021-06-26 LAB — MAGNESIUM: Magnesium: 3 mg/dL — ABNORMAL HIGH (ref 1.7–2.4)

## 2021-06-26 MED ORDER — HEPARIN SODIUM (PORCINE) 1000 UNIT/ML IJ SOLN
INTRAMUSCULAR | Status: AC
Start: 2021-06-26 — End: 2021-06-26
  Filled 2021-06-26: qty 3

## 2021-06-26 NOTE — Progress Notes (Signed)
PROGRESS NOTE    Ruben [name]  DXA:[id number] DOB: Sep 22, 1945 DOA: 06/07/2021 PCP: [name], FNP  Brief Narrative:75 y.o. BM PMHx HTN, CAD, recent hospitalization for non-STEMI and acute systolic CHF with cardiogenic shock requiring ICU stay and inotrope support, A. fib with RVR, HLD, ESRD on HD M/W/F,  prior alcohol abuse, and tobacco abuse    Presents with complaints of rectal pain and difficulty swallowing over the last 2 weeks.  His most recent hospital stay was prolonged, he was here for almost a month.  He has declined since with weight loss, poor p.o. intake, dysphagia as well as increased constipation.  There was concern for acute cholecystitis on admission and general surgery was consulted.  Initial imaging showed some nonspecific liver lesions, on a noncontrast study, and also patient complained of dysphagia for which GI was consulted. Overall improving, his liver lesions are not significant. On 2/17 he was supposed to go home but became more dyspneic, underwent HD x 2 2/17 and 2/18.  Assessment & Plan:   Active Problems:   HLD (hyperlipidemia)   ESRD on hemodialysis (HCC)   Weight loss   Suspected acute cholecystitis   Rectal pain and constipation   Dysphagia   Paroxysmal atrial fibrillation (HCC)   Chronic systolic CHF (congestive heart failure) (HCC)   ETOH abuse   Normocytic anemia   Transaminitis   CAD (coronary artery disease)   Liver lesions, elevated LFTs   Anxiety and Depression   #1 fecal impaction/rectal ulceration and proctitis -status post manual disimpaction and is on aggressive bowel regime. He was disimpacted by GI.  Flexible sigmoidoscopy 06/14/2021 showed rectal ulceration and proctitis.  Biopsies showed focal erosion.  #2 chronic hypoxic respiratory failure secondary to fluid overload he underwent extra session of dialysis on 06/21/2021 and 06/22/2021.  Chest x-ray at that time showed fluid overload.  On 219 he was back to his baseline with  regards to oxygen.  #3 chronic systolic CHF with ejection fraction 35 to 40% with moderate mitral regurgitation.  Continue midodrine.  Patient with recent cardiogenic shock and NSTEMI was in the hospital for close to a month.  #4 history of CAD/recent NSTEMI December 2022 underwent cardiac catheterization with RCA occlusion with diffuse disease of the LAD and left circumflex.  It was not amenable to PCI and he was not a surgical candidate for CABG.  #5 paroxysmal atrial fibrillation on sinus rhythm no apixaban 5 mg twice a day.  #6 ESRD on hemodialysis Monday Wednesday and Friday he has a right AV fistula placed recently.  Patient is currently receiving dialysis through an HD cath.  #7 anxiety and depression on Remeron and hydroxyzine  #8 liver lesions and elevated LFTs he has multiple low-attenuation lesions measuring about 12 mm in diameter in the liver.  MRI shows fairly benign lesions.       Nutrition Problem: Severe Malnutrition (in the context of chronic illness) Etiology: poor appetite     Signs/Symptoms: severe fat depletion, severe muscle depletion, percent weight loss (13.4% x 4 months) Percent weight loss: 13.4 %    Interventions: Refer to RD note for recommendations  Estimated body mass index is 20.63 kg/m as calculated from the following:   Height as of this encounter: 5\' 11"  (1.803 m).   Weight as of this encounter: 67.1 kg.  DVT prophylaxis: Heparin Family Communication: Discussed with daughter at bedside  disposition Plan:  Status is: Inpatient  Remains inpatient appropriate because: Possible SNF tomorrow     Consultants:  GI  Procedures: Flex sig 06/14/2021 Disimpaction Antimicrobials: None Subjective: He is resting in bed seen after dialysis complaining of severe pain in the anal area. KUB shows ileus.  Objective: Vitals:   06/26/21 1100 06/26/21 1130 06/26/21 1145 06/26/21 1234  BP: (!) 106/53 (!) 109/49 (!) 109/54 (!) 132/57  Pulse: 68   81   Resp: 15 18 19 16   Temp:   (!) 97.5 F (36.4 C) 97.9 F (36.6 C)  TempSrc:   Temporal Oral  SpO2:    98%  Weight:      Height:        Intake/Output Summary (Last 24 hours) at 06/26/2021 1533 Last data filed at 06/26/2021 1145 Gross per 24 hour  Intake 0 ml  Output 1500 ml  Net -1500 ml   Filed Weights   06/25/21 0500 06/26/21 0500 06/26/21 0758  Weight: 62.8 kg 67.2 kg 67.1 kg    Examination:  General exam: Appears calm and comfortable  Respiratory system: Clear to auscultation. Respiratory effort normal. Cardiovascular system: S1 & S2 heard, RRR. No JVD, murmurs, rubs, gallops or clicks. No pedal edema. Gastrointestinal system: Abdomen is nondistended, soft and tender. No organomegaly or masses felt. Normal bowel sounds heard. Central nervous system: Alert and oriented. No focal neurological deficits. Extremities: Symmetric 5 x 5 power. Skin: No rashes, lesions or ulcers Psychiatry: Judgement and insight appear normal. Mood & affect appropriate.     Data Reviewed: I have personally reviewed following labs and imaging studies  CBC: Recent Labs  Lab 06/22/21 1634 06/23/21 0801 06/24/21 0122 06/25/21 0244 06/26/21 0334  WBC 6.7 6.0 6.1 5.1 6.0  NEUTROABS  --  4.1 4.2 3.3 4.4  HGB 9.7* 10.2* 10.0* 9.8* 10.1*  HCT 30.8* 30.8* 31.0* 30.4* 31.7*  MCV 101.7* 98.7 98.7 99.3 98.8  PLT 141* 147* 143* 133* 025*   Basic Metabolic Panel: Recent Labs  Lab 06/22/21 1053 06/23/21 0801 06/24/21 0122 06/25/21 0244 06/26/21 0334  NA 133* 134* 134* 134* 132*  K 4.0 4.0 4.1 3.6 4.0  CL 94* 93* 93* 94* 93*  CO2 27 26 27 29 25   GLUCOSE 115* 103* 120* 113* 117*  BUN 45* 60* 75* 36* 58*  CREATININE 6.00* 7.56* 9.05* 5.52* 7.36*  CALCIUM 8.6* 8.8* 9.0 8.9 9.2  MG  --  2.9* 3.2* 2.7* 3.0*  PHOS 4.9* 5.6* 5.4* 4.1 3.9   GFR: Estimated Creatinine Clearance: 8.2 mL/min (A) (by C-G formula based on SCr of 7.36 mg/dL (H)). Liver Function Tests: Recent Labs  Lab  06/21/21 1435 06/22/21 1053 06/23/21 0801 06/24/21 0122 06/25/21 0244 06/26/21 0334  AST 35  --  30 29 27 31   ALT 73*  --  51* 45* 37 34  ALKPHOS 107  --  92 89 86 83  BILITOT 1.1  --  1.3* 1.2 1.6* 1.2  PROT 6.1*  --  6.5 6.5 6.5 6.8  ALBUMIN 3.0*   3.0* 2.9* 3.2* 3.2* 3.4* 3.3*   No results for input(s): LIPASE, AMYLASE in the last 168 hours. No results for input(s): AMMONIA in the last 168 hours. Coagulation Profile: No results for input(s): INR, PROTIME in the last 168 hours. Cardiac Enzymes: No results for input(s): CKTOTAL, CKMB, CKMBINDEX, TROPONINI in the last 168 hours. BNP (last 3 results) No results for input(s): PROBNP in the last 8760 hours. HbA1C: No results for input(s): HGBA1C in the last 72 hours. CBG: Recent Labs  Lab 06/25/21 0803 06/25/21 1141 06/25/21 1556 06/26/21 0015 06/26/21 [place]  143* 166* 140* 127* 116*   Lipid Profile: No results for input(s): CHOL, HDL, LDLCALC, TRIG, CHOLHDL, LDLDIRECT in the last 72 hours. Thyroid Function Tests: No results for input(s): TSH, T4TOTAL, FREET4, T3FREE, THYROIDAB in the last 72 hours. Anemia Panel: No results for input(s): VITAMINB12, FOLATE, FERRITIN, TIBC, IRON, RETICCTPCT in the last 72 hours. Sepsis Labs: No results for input(s): PROCALCITON, LATICACIDVEN in the last 168 hours.  Recent Results (from the past 240 hour(s))  Resp Panel by RT-PCR (Flu A&B, Covid) Nasopharyngeal Swab     Status: None   Collection Time: 06/26/21  1:46 PM   Specimen: Nasopharyngeal Swab; Nasopharyngeal(NP) swabs in vial transport medium  Result Value Ref Range Status   SARS Coronavirus 2 by RT PCR NEGATIVE NEGATIVE Final    Comment: (NOTE) SARS-CoV-2 target nucleic acids are NOT DETECTED.  The SARS-CoV-2 RNA is generally detectable in upper respiratory specimens during the acute phase of infection. The lowest concentration of SARS-CoV-2 viral copies this assay can detect is 138 copies/mL. A negative result does  not preclude SARS-Cov-2 infection and should not be used as the sole basis for treatment or other patient management decisions. A negative result may occur with  improper specimen collection/handling, submission of specimen other than nasopharyngeal swab, presence of viral mutation(s) within the areas targeted by this assay, and inadequate number of viral copies(<138 copies/mL). A negative result must be combined with clinical observations, patient history, and epidemiological information. The expected result is Negative.  Fact Sheet for Patients:  EntrepreneurPulse.com.au  Fact Sheet for Healthcare Providers:  IncredibleEmployment.be  This test is no t yet approved or cleared by the [place] FDA and  has been authorized for detection and/or diagnosis of SARS-CoV-2 by FDA under an Emergency Use Authorization (EUA). This EUA will remain  in effect (meaning this test can be used) for the duration of the COVID-19 declaration under Section 564(b)(1) of the Act, 21 U.S.C.section 360bbb-3(b)(1), unless the authorization is terminated  or revoked sooner.       Influenza A by PCR NEGATIVE NEGATIVE Final   Influenza B by PCR NEGATIVE NEGATIVE Final    Comment: (NOTE) The Xpert Xpress SARS-CoV-2/FLU/RSV plus assay is intended as an aid in the diagnosis of influenza from Nasopharyngeal swab specimens and should not be used as a sole basis for treatment. Nasal washings and aspirates are unacceptable for Xpert Xpress SARS-CoV-2/FLU/RSV testing.  Fact Sheet for Patients: EntrepreneurPulse.com.au  Fact Sheet for Healthcare Providers: IncredibleEmployment.be  This test is not yet approved or cleared by the [place] FDA and has been authorized for detection and/or diagnosis of SARS-CoV-2 by FDA under an Emergency Use Authorization (EUA). This EUA will remain in effect (meaning this test can be used) for the  duration of the COVID-19 declaration under Section 564(b)(1) of the Act, 21 U.S.C. section 360bbb-3(b)(1), unless the authorization is terminated or revoked.  Performed at [facility], [place] [address], [place], [place] [address]          Radiology Studies: DG Abd 1 View  Result Date: 06/25/2021 CLINICAL DATA:  Constipation EXAM: ABDOMEN - 1 VIEW COMPARISON:  Previous studies including the examination of 06/09/2021 FINDINGS: There is moderate gaseous distention of stomach. There are few slightly dilated small bowel loops. Gas and stool are noted in colon. Tip of large caliber central venous catheter is seen in the region of right atrium. There is blunting of left lateral CP angle. IMPRESSION: There is moderate gaseous distention of stomach and mild dilation of  small-bowel loops suggesting ileus. Electronically Signed   By: Elmer Picker M.D.   On: 06/25/2021 16:15        Scheduled Meds:  (feeding supplement) PROSource Plus  30 mL Oral TID BM   apixaban  5 mg Oral BID   atorvastatin  80 mg Oral Daily   bisacodyl  10 mg Rectal Daily   Chlorhexidine Gluconate Cloth  6 each Topical Q0600   darbepoetin (ARANESP) injection - DIALYSIS  60 mcg Intravenous Q Mon-HD   diltiazem   Topical BID   ferric citrate  420 mg Oral TID WC   gabapentin  100 mg Oral BID   heparin sodium (porcine)       hydrocortisone  25 mg Rectal BID   hydrOXYzine  10 mg Oral Q M,W,F-1800   magnesium oxide  200 mg Oral Daily   midodrine  10 mg Oral TID WC   midodrine  10 mg Oral Q M,W,F-HD   mirtazapine  3.75 mg Oral QHS   multivitamin  1 tablet Oral QHS   polyethylene glycol  17 g Oral BID   senna-docusate  2 tablet Oral BID   sodium chloride flush  3 mL Intravenous Q12H   Continuous Infusions:  promethazine (PHENERGAN) injection (IM or IVPB) Stopped (06/13/21 2334)     LOS: 18 days    Time spent:  Georgette Shell, MD  06/26/2021, 3:33 PM

## 2021-06-26 NOTE — Progress Notes (Signed)
[facility] Progress Note   Subjective:  Seen on HD - 1.5L UFG and tolerating so far. BP chronically low/on midodrine. Denies CP or dyspnea, says slept better last night.  Objective Vitals:   06/26/21 0500 06/26/21 0758 06/26/21 0800 06/26/21 0808  BP:   (!) 101/54 (!) 101/52  Pulse:   74 72  Resp:   16   Temp:   (!) 97.3 F (36.3 C)   TempSrc:   Oral   SpO2:      Weight: 67.2 kg 67.1 kg    Height:       Physical Exam General: Elderly man, NAD. Room air. Heart: RRR; no murmur Lungs: CTA anteriorly, no rales Abdomen: soft, non-tender Extremities: No LE edema Dialysis Access: TDC, LUE AVF + thrill  Additional Objective Labs: Basic Metabolic Panel: Recent Labs  Lab 06/24/21 0122 06/25/21 0244 06/26/21 0334  NA 134* 134* 132*  K 4.1 3.6 4.0  CL 93* 94* 93*  CO2 27 29 25   GLUCOSE 120* 113* 117*  BUN 75* 36* 58*  CREATININE 9.05* 5.52* 7.36*  CALCIUM 9.0 8.9 9.2  PHOS 5.4* 4.1 3.9   Liver Function Tests: Recent Labs  Lab 06/24/21 0122 06/25/21 0244 06/26/21 0334  AST 29 27 31   ALT 45* 37 34  ALKPHOS 89 86 83  BILITOT 1.2 1.6* 1.2  PROT 6.5 6.5 6.8  ALBUMIN 3.2* 3.4* 3.3*   CBC: Recent Labs  Lab 06/22/21 1634 06/22/21 1634 06/23/21 0801 06/24/21 0122 06/25/21 0244 06/26/21 0334  WBC 6.7  --  6.0 6.1 5.1 6.0  NEUTROABS  --    < > 4.1 4.2 3.3 4.4  HGB 9.7*  --  10.2* 10.0* 9.8* 10.1*  HCT 30.8*  --  30.8* 31.0* 30.4* 31.7*  MCV 101.7*  --  98.7 98.7 99.3 98.8  PLT 141*  --  147* 143* 133* 144*   < > = values in this interval not displayed.   Studies/Results: DG Abd 1 View  Result Date: 06/25/2021 CLINICAL DATA:  Constipation EXAM: ABDOMEN - 1 VIEW COMPARISON:  Previous studies including the examination of 06/09/2021 FINDINGS: There is moderate gaseous distention of stomach. There are few slightly dilated small bowel loops. Gas and stool are noted in colon. Tip of large caliber central venous catheter is seen in the region of right  atrium. There is blunting of left lateral CP angle. IMPRESSION: There is moderate gaseous distention of stomach and mild dilation of small-bowel loops suggesting ileus. Electronically Signed   By: Elmer Picker M.D.   On: 06/25/2021 16:15    Medications:  promethazine (PHENERGAN) injection (IM or IVPB) Stopped (06/13/21 2334)    (feeding supplement) PROSource Plus  30 mL Oral TID BM   apixaban  5 mg Oral BID   atorvastatin  80 mg Oral Daily   bisacodyl  10 mg Rectal Daily   Chlorhexidine Gluconate Cloth  6 each Topical Q0600   darbepoetin (ARANESP) injection - DIALYSIS  60 mcg Intravenous Q Mon-HD   diltiazem   Topical BID   ferric citrate  420 mg Oral TID WC   gabapentin  100 mg Oral BID   heparin sodium (porcine)       hydrocortisone  25 mg Rectal BID   hydrOXYzine  10 mg Oral Q M,W,F-1800   magnesium oxide  200 mg Oral Daily   midodrine  10 mg Oral TID WC   midodrine  10 mg Oral Q M,W,F-HD   mirtazapine  3.75 mg Oral QHS  multivitamin  1 tablet Oral QHS   polyethylene glycol  17 g Oral BID   senna-docusate  2 tablet Oral BID   sodium chloride flush  3 mL Intravenous Q12H    Dialysis Orders: MWF at [facility] 4hr, 400/500, EDW 72kg (although leaving 74kg range), 2K/2Ca, TDC (+ maturing AVF), heparin 2000 unit bolus - No ESA or VDRA   Assessment/Plan: Proctitis/ rectal ulceration/ fecal impaction: On aggressive bowel regimen.  Disimpacted by GI w/large amount of stool removed. Having good BM but continued rectal pain.  GI following - Flex Sig on 2/10 rectal ulceration/proctitis, biopsies showed "focal erosion." Liver/kidney lesions: Elevated LFTs. MRI showed benign liver cysts stable since 2019, no suspicious liver lesions, 2 small indeterminate T2 hypointense renal cortical lesions.  Dysphagia: Swallow studies normal.  ESRD:  Continue HD on MWF schedule. CXR 2/17 with pulmonary edema, s/p extra HD 2/18. Back to usual  MWF schedule - HD today. Hypotension/volume: Chronically  low BP, on midodrine 10mg  TID. Continue UF as BP allows. Under EDW, will need to be lowered on d/c . Anemia: Hgb 10.1 - FOBT negative. Continue Aranesp 40mcg q Monday.  Metabolic bone disease: CorrCa/Phos both initially high, now improving. Lorin Picket was increased to 2/meals. CAD/HFrEF - missed outpatient cardiology follow up.  Will need to reschedule after d/c unless need to be seen as inpatient. A-fib: On amiodarone/Eliquis.  Nutrition - On regular diet. RD concerned for poor intake. Prot supp for low albumin.  Follow labs.  Depression/anxiety - psych consulted, started mirtazapine/hydroxyzine.    Ruben Penton, PA-C 06/26/2021, 8:45 AM  [employer]

## 2021-06-26 NOTE — [place] FL2 (Signed)
[place] MEDICAID FL2 LEVEL OF CARE SCREENING TOOL     IDENTIFICATION  Patient Name: Ruben [name] Birthdate: [birth year] Sex: male Admission Date (Current Location): 06/07/2021  [facility] and [place] Number:  Herbalist and Address:  The . [facility], [place] [address], [place], [place] [address]      Provider Number: [id number]  Attending Physician Name and Address:  Georgette Shell, MD  Relative Name and Phone Number:       Current Level of Care: Hospital Recommended Level of Care: [place] Prior Approval Number:    Date Approved/Denied:   PASRR Number: pending  Discharge Plan: SNF    Current Diagnoses: Patient Active Problem List   Diagnosis Date Noted   Anxiety and Depression    Liver lesions, elevated LFTs 06/10/2021   Rectal pain and constipation 06/08/2021   Dysphagia 06/08/2021   Paroxysmal atrial fibrillation ([place]) 15/40/0867   Chronic systolic CHF (congestive heart failure) ([place]) 06/08/2021   ETOH abuse 06/08/2021   Normocytic anemia 06/08/2021   Transaminitis 06/08/2021   CAD (coronary artery disease) 06/08/2021   ESRD on hemodialysis ([place]) 06/07/2021   Personal history of colonic polyps 06/07/2021   Rectal bleeding 06/07/2021   Colon cancer screening 06/07/2021   Weight loss 06/07/2021   Suspected acute cholecystitis 61/95/0932   Acute systolic heart failure (HCC)    Atrial fibrillation with RVR ([place])    Protein-calorie malnutrition, severe 05/24/2021   AKI (acute kidney injury) ([place])    Encounter for central line placement    Cardiogenic shock ([place])    Acute myocardial infarction ([place]) 04/29/2021   Stage 3 chronic kidney disease ([place]) 07/12/2018   Prediabetes 07/12/2018   Right upper quadrant abdominal pain 08/10/2017   Stomach in right sided position 06/09/2017   Right knee pain 02/15/2016   Right patella fracture 02/12/2016   HTN (hypertension) 02/21/2013   Tobacco abuse 02/21/2013    HLD (hyperlipidemia) 02/21/2013    Orientation RESPIRATION BLADDER Height & Weight     Self, Time, Situation, Place  Normal Incontinent Weight: 147 lb 14.9 oz (67.1 kg) Height:  5\' 11"  (180.3 cm)  BEHAVIORAL SYMPTOMS/MOOD NEUROLOGICAL BOWEL NUTRITION STATUS      Incontinent Diet  AMBULATORY STATUS COMMUNICATION OF NEEDS Skin   Extensive Assist Verbally Normal                       Personal Care Assistance Level of Assistance  Bathing, Feeding, Dressing Bathing Assistance: Limited assistance Feeding assistance: Limited assistance Dressing Assistance: Limited assistance     Functional Limitations Info  Sight, Hearing, Speech Sight Info: Adequate Hearing Info: Adequate Speech Info: Adequate    SPECIAL CARE FACTORS FREQUENCY  PT (By licensed PT), OT (By licensed OT)     PT Frequency: 5x a week OT Frequency: 5x a week            Contractures Contractures Info: Not present    Additional Factors Info  Code Status, Allergies Code Status Info: Full Allergies Info: other           Current Medications (06/26/2021):  This is the current hospital active medication list Current Facility-Administered Medications  Medication Dose Route Frequency Provider Last Rate Last Admin   (feeding supplement) PROSource Plus liquid 30 mL  30 mL Oral TID BM Lynnda Child, PA-C   30 mL at 06/26/21 1310   acetaminophen (TYLENOL) tablet 650 mg  650 mg Oral Q6H PRN Tamala Julian, Rondell A,  MD   650 mg at 06/20/21 1547   Or   acetaminophen (TYLENOL) suppository 650 mg  650 mg Rectal Q6H PRN Fuller Plan A, MD       albuterol (PROVENTIL) (2.5 MG/3ML) 0.083% nebulizer solution 2.5 mg  2.5 mg Nebulization Q6H PRN Fuller Plan A, MD   2.5 mg at 06/21/21 0904   apixaban (ELIQUIS) tablet 5 mg  5 mg Oral BID Donnamae Jude, RPH   5 mg at 06/26/21 1251   atorvastatin (LIPITOR) tablet 80 mg  80 mg Oral Daily Allie Bossier, MD   80 mg at 06/26/21 1252   bisacodyl (DULCOLAX) suppository 10 mg   10 mg Rectal Daily Vladimir Crofts, PA-C   10 mg at 06/26/21 1252   Chlorhexidine Gluconate Cloth 2 % PADS 6 each  6 each Topical Q0600 Loren Racer, PA-C   6 each at 06/26/21 2130   Darbepoetin Alfa (ARANESP) injection 60 mcg  60 mcg Intravenous Q Mon-HD Loren Racer, PA-C   60 mcg at 06/24/21 8657   Diltiazem 2% gel (15 ml container)   Topical BID Carol Ada, MD   Given at 06/26/21 1253   ferric citrate (AURYXIA) tablet 420 mg  420 mg Oral TID WC Penninger, Ria Comment, PA   420 mg at 06/26/21 1253   gabapentin (NEURONTIN) capsule 100 mg  100 mg Oral BID Irene Pap N, DO   100 mg at 06/26/21 1252   heparin sodium (porcine) 1000 UNIT/ML injection            heparin sodium (porcine) injection 3,800 Units  3,800 Units Intracatheter PRN Penninger, Ria Comment, [place]   3,800 Units at 06/26/21 1143   hydrocortisone (ANUSOL-HC) suppository 25 mg  25 mg Rectal BID Fuller Plan A, MD   25 mg at 06/26/21 1252   HYDROmorphone (DILAUDID) injection 1 mg  1 mg Intravenous Q3H PRN Caren Griffins, MD   1 mg at 06/26/21 1302   hydrOXYzine (ATARAX) tablet 10 mg  10 mg Oral TID PRN Irene Pap N, DO   10 mg at 06/24/21 2133   hydrOXYzine (ATARAX) tablet 10 mg  10 mg Oral Q M,W,F-1800 Doda, Vandana, MD   10 mg at 06/24/21 1346   magnesium oxide (MAG-OX) tablet 200 mg  200 mg Oral Daily Tamala Julian, Rondell A, MD   200 mg at 06/26/21 1253   midodrine (PROAMATINE) tablet 10 mg  10 mg Oral TID WC Smith, Rondell A, MD   10 mg at 06/26/21 1252   midodrine (PROAMATINE) tablet 10 mg  10 mg Oral Q M,W,F-HD Penninger, Lindsay, PA   10 mg at 06/26/21 1245   mirtazapine (REMERON) tablet 3.75 mg  3.75 mg Oral QHS Cinderella, Margaret A   3.75 mg at 06/25/21 2111   multivitamin (RENA-VIT) tablet 1 tablet  1 tablet Oral QHS Caren Griffins, MD   1 tablet at 06/25/21 2112   ondansetron (ZOFRAN) injection 4 mg  4 mg Intravenous Q6H PRN Caren Griffins, MD   4 mg at 06/13/21 1918   oxyCODONE (Oxy IR/ROXICODONE) immediate  release tablet 5 mg  5 mg Oral Q6H PRN Caren Griffins, MD   5 mg at 06/22/21 2019   polyethylene glycol (MIRALAX / GLYCOLAX) packet 17 g  17 g Oral BID Vladimir Crofts, PA-C   17 g at 06/26/21 1253   promethazine (PHENERGAN) 6.25 mg in sodium chloride 0.9 % 50 mL IVPB  6.25 mg Intravenous Q6H PRN Gherghe, Costin M,  MD   Stopped at 06/13/21 2334   senna-docusate (Senokot-S) tablet 2 tablet  2 tablet Oral BID Caren Griffins, MD   2 tablet at 06/26/21 1251   sodium chloride flush (NS) 0.9 % injection 3 mL  3 mL Intravenous Q12H Fuller Plan A, MD   3 mL at 06/26/21 1254     Discharge Medications: Please see discharge summary for a list of discharge medications.  Relevant Imaging Results:  Relevant Lab Results:   Additional Information SSN: [id number]  Emeterio Reeve, LCSW

## 2021-06-26 NOTE — Progress Notes (Signed)
Physical Therapy Treatment Patient Details Name: Ruben [name] MRN: [id number] DOB: May 20, 1945 Today's Date: 06/26/2021   History of Present Illness Pt is 76 y.o. male admitted 06/07/21 with ongoing c/o vomiting/rectal pain. Imaging (+) for bibasilar atelectasis, lesions in liver/kidneys, possible cholecystitis, possible AVN B femoral heads. S/p flexible sigmoidoscopy 2/10 revealing mucosal ulceration, proctitis. PMH includes arthritis, ESRD on dialysis, ETOH abuse, HTN, MI    PT Comments    Pt received supine s/p HD and agreeable with encouragement for functional mobility. Pt able to come to sitting EOB with min assist. Pt able to rise to standing with substantially increased time need to recover before standing. Once standing pt able to maintain with min guard for >5 mins and march in place until max fatigue. Pt declined ambulation d/t fatigue. Plan to try to see pt tomorrow as opposed to Friday (HD day) for progression of mobility. Pt continues to benefit from skilled PT services to progress toward functional mobility goals.    Recommendations for follow up therapy are one component of a multi-disciplinary discharge planning process, led by the attending physician.  Recommendations may be updated based on patient status, additional functional criteria and insurance authorization.  Follow Up Recommendations  Skilled nursing-short term rehab (<3 hours/day)     Assistance Recommended at Discharge Frequent or constant Supervision/Assistance  Patient can return home with the following A lot of help with walking and/or transfers;A lot of help with bathing/dressing/bathroom;Assistance with cooking/housework;Assistance with feeding;Direct supervision/assist for medications management;Direct supervision/assist for financial management;Assist for transportation;Help with stairs or ramp for entrance   Equipment Recommendations  None recommended by PT    Recommendations for Other Services        Precautions / Restrictions Precautions Precautions: Fall Precaution Comments: falls with fatigue Restrictions Weight Bearing Restrictions: No     Mobility  Bed Mobility Overal bed mobility: Needs Assistance Bed Mobility: Supine to Sit, Sit to Supine     Supine to sit: Min assist Sit to supine: Min assist   General bed mobility comments: pt required assist to reach for bed rail and to roll to side, pt initiated bring LE's off EOB and assist needed to fully raise trunk and steady balance at EOB.    Transfers Overall transfer level: Needs assistance Equipment used: Rolling walker (2 wheels) Transfers: Sit to/from Stand Sit to Stand: Min guard                Ambulation/Gait             Pre-gait activities: static standing at EOB x60mins, standing marching x10, 2 steps tward [facility]     Stairs             Wheelchair Mobility    Modified Rankin (Stroke Patients Only)       Balance Overall balance assessment: Needs assistance Sitting-balance support: Feet supported, Bilateral upper extremity supported Sitting balance-[name] Scale: Fair Sitting balance - Comments: reaches outside BOS does not lose balance   Standing balance support: Bilateral upper extremity supported, Reliant on assistive device for balance, During functional activity Standing balance-[name] Scale: Poor                              Cognition Arousal/Alertness: Lethargic Behavior During Therapy: Flat affect Overall Cognitive Status: Within Functional Limits for tasks assessed  General Comments: feeling drowsy post HD        Exercises General Exercises - Lower Extremity Ankle Circles/Pumps: AROM, Both, 10 reps, Supine Hip Flexion/Marching: AROM, Both, 20 reps, Standing    General Comments General comments (skin integrity, edema, etc.): 2L [place]      Pertinent Vitals/Pain      Home Living                           Prior Function            PT Goals (current goals can now be found in the care plan section) Acute Rehab PT Goals PT Goal Formulation: With patient Time For Goal Achievement: 06/29/21 Potential to Achieve Goals: Good    Frequency    Min 3X/week      PT Plan Current plan remains appropriate    Co-evaluation              AM-PAC PT "6 Clicks" Mobility   Outcome Measure  Help needed turning from your back to your side while in a flat bed without using bedrails?: A Little Help needed moving from lying on your back to sitting on the side of a flat bed without using bedrails?: A Lot Help needed moving to and from a bed to a chair (including a wheelchair)?: A Lot Help needed standing up from a chair using your arms (e.g., wheelchair or bedside chair)?: A Lot Help needed to walk in hospital room?: A Lot Help needed climbing 3-5 steps with a railing? : Total 6 Click Score: 12    End of Session Equipment Utilized During Treatment: Gait belt;Oxygen Activity Tolerance: Patient tolerated treatment well Patient left: in bed;with call bell/phone within reach;with bed alarm set;with nursing/sitter in room Nurse Communication: Mobility status PT Visit Diagnosis: Unsteadiness on feet (R26.81);Other abnormalities of gait and mobility (R26.89);Pain     Time: 1610-9604 PT Time Calculation (min) (ACUTE ONLY): 21 min  Charges:  $Gait Training: 8-22 mins                     Marissa Weaver R. PTA Acute Rehabilitation Services Office: [place] 06/26/2021, 4:29 PM

## 2021-06-26 NOTE — Progress Notes (Signed)
Contacted by CSW to say pt for possible d/c to snf tomorrow. Contacted [facility] SW to make clinic aware of pt's possible d/c tomorrow to [facility]. Clinic advised pt will resume care on Friday if d/c tomorrow. Will assist as needed.   Melven Sartorius Renal Navigator [phone]

## 2021-06-26 NOTE — Progress Notes (Signed)
Inpatient Rehab Admissions Coordinator:   Per therapy recommendation, patient was screened for CIR candidacy by Clemens Catholic, MS, CCC-SLP. At this time, Pt. does not appear to demonstrate medical necessity to justify in hospital rehabilitation/CIR. PT recommends SNF and OT recommends HH. It looks like TOC is already working on SNF and Pt. And daughter are hoping for [facility]. I will not pursue a CIR admission  for this Pt.   CIR to sign off. Please contact me with any questions.  Clemens Catholic, [place], [place] Admissions Coordinator  [phone] ([place]) [phone] (office)

## 2021-06-26 NOTE — TOC Progression Note (Signed)
Transition of Care [facility]) - Progression Note    Patient Details  Name: Ruben [name] MRN: [id number] Date of Birth: [birth year]  Transition of Care [facility]) CM/SW Contact  Emeterio Reeve, [place] Phone Number: 06/26/2021, 2:06 PM  Clinical Narrative:     CSW spoke to pts daughter Leonia Reeves. Reba requested a CIR consult. CIS screened pt and determined he does not meet criteria.   CSW updated daughter and gave er bed offers. Daughter accepted bed at [facility]. CSW requested covid test. CSW was unable to complete pt PASSR due to someone else using his social. CSW informed daughter and requested proof of SSN to give to Passr to complete process.   CSW will follow for updates.  Expected Discharge Plan: [place] Barriers to Discharge: Continued Medical Work up  Expected Discharge Plan and Services Expected Discharge Plan: [place] Choice: Home Health   Expected Discharge Date: 06/20/21               DME Arranged: Oxygen, Other see comment (transport chair)   Date DME Agency Contacted: 06/24/21 Time DME Agency Contacted: 1300 Representative spoke with at DME Agency: Freda Munro HH Arranged: RN, PT, OT, Social Work [facility] Agency: [facility] [place] Date [place]: 06/20/21   Representative spoke with at [place]: [place] ([place]) Interventions    Readmission Risk Interventions No flowsheet data found.  Emeterio Reeve, LCSW Clinical Social Worker

## 2021-06-27 ENCOUNTER — Encounter (HOSPITAL_COMMUNITY): Payer: Medicare Other

## 2021-06-27 DIAGNOSIS — N2581 Secondary hyperparathyroidism of renal origin: Secondary | ICD-10-CM | POA: Diagnosis not present

## 2021-06-27 DIAGNOSIS — I5022 Chronic systolic (congestive) heart failure: Secondary | ICD-10-CM | POA: Diagnosis not present

## 2021-06-27 DIAGNOSIS — J9611 Chronic respiratory failure with hypoxia: Secondary | ICD-10-CM | POA: Diagnosis not present

## 2021-06-27 DIAGNOSIS — I2510 Atherosclerotic heart disease of native coronary artery without angina pectoris: Secondary | ICD-10-CM | POA: Diagnosis not present

## 2021-06-27 DIAGNOSIS — R2681 Unsteadiness on feet: Secondary | ICD-10-CM | POA: Diagnosis not present

## 2021-06-27 DIAGNOSIS — R1310 Dysphagia, unspecified: Secondary | ICD-10-CM | POA: Diagnosis not present

## 2021-06-27 DIAGNOSIS — D689 Coagulation defect, unspecified: Secondary | ICD-10-CM | POA: Diagnosis not present

## 2021-06-27 DIAGNOSIS — D529 Folate deficiency anemia, unspecified: Secondary | ICD-10-CM | POA: Diagnosis not present

## 2021-06-27 DIAGNOSIS — D464 Refractory anemia, unspecified: Secondary | ICD-10-CM | POA: Diagnosis not present

## 2021-06-27 DIAGNOSIS — I252 Old myocardial infarction: Secondary | ICD-10-CM | POA: Diagnosis not present

## 2021-06-27 DIAGNOSIS — R109 Unspecified abdominal pain: Secondary | ICD-10-CM | POA: Diagnosis not present

## 2021-06-27 DIAGNOSIS — K810 Acute cholecystitis: Secondary | ICD-10-CM | POA: Diagnosis not present

## 2021-06-27 DIAGNOSIS — E119 Type 2 diabetes mellitus without complications: Secondary | ICD-10-CM | POA: Diagnosis not present

## 2021-06-27 DIAGNOSIS — M6281 Muscle weakness (generalized): Secondary | ICD-10-CM | POA: Diagnosis not present

## 2021-06-27 DIAGNOSIS — D509 Iron deficiency anemia, unspecified: Secondary | ICD-10-CM | POA: Diagnosis not present

## 2021-06-27 DIAGNOSIS — I10 Essential (primary) hypertension: Secondary | ICD-10-CM | POA: Diagnosis not present

## 2021-06-27 DIAGNOSIS — R1312 Dysphagia, oropharyngeal phase: Secondary | ICD-10-CM | POA: Diagnosis not present

## 2021-06-27 DIAGNOSIS — R6889 Other general symptoms and signs: Secondary | ICD-10-CM | POA: Diagnosis not present

## 2021-06-27 DIAGNOSIS — E43 Unspecified severe protein-calorie malnutrition: Secondary | ICD-10-CM | POA: Diagnosis not present

## 2021-06-27 DIAGNOSIS — I214 Non-ST elevation (NSTEMI) myocardial infarction: Secondary | ICD-10-CM | POA: Diagnosis not present

## 2021-06-27 DIAGNOSIS — E785 Hyperlipidemia, unspecified: Secondary | ICD-10-CM | POA: Diagnosis not present

## 2021-06-27 DIAGNOSIS — I5021 Acute systolic (congestive) heart failure: Secondary | ICD-10-CM | POA: Diagnosis not present

## 2021-06-27 DIAGNOSIS — N179 Acute kidney failure, unspecified: Secondary | ICD-10-CM | POA: Diagnosis not present

## 2021-06-27 DIAGNOSIS — I120 Hypertensive chronic kidney disease with stage 5 chronic kidney disease or end stage renal disease: Secondary | ICD-10-CM | POA: Diagnosis not present

## 2021-06-27 DIAGNOSIS — K5120 Ulcerative (chronic) proctitis without complications: Secondary | ICD-10-CM | POA: Diagnosis not present

## 2021-06-27 DIAGNOSIS — N186 End stage renal disease: Secondary | ICD-10-CM | POA: Diagnosis not present

## 2021-06-27 DIAGNOSIS — Z743 Need for continuous supervision: Secondary | ICD-10-CM | POA: Diagnosis not present

## 2021-06-27 DIAGNOSIS — D631 Anemia in chronic kidney disease: Secondary | ICD-10-CM | POA: Diagnosis not present

## 2021-06-27 DIAGNOSIS — I5040 Unspecified combined systolic (congestive) and diastolic (congestive) heart failure: Secondary | ICD-10-CM | POA: Diagnosis not present

## 2021-06-27 DIAGNOSIS — K769 Liver disease, unspecified: Secondary | ICD-10-CM | POA: Diagnosis not present

## 2021-06-27 DIAGNOSIS — E559 Vitamin D deficiency, unspecified: Secondary | ICD-10-CM | POA: Diagnosis not present

## 2021-06-27 DIAGNOSIS — N250 Renal osteodystrophy: Secondary | ICD-10-CM | POA: Diagnosis not present

## 2021-06-27 DIAGNOSIS — D519 Vitamin B12 deficiency anemia, unspecified: Secondary | ICD-10-CM | POA: Diagnosis not present

## 2021-06-27 DIAGNOSIS — I959 Hypotension, unspecified: Secondary | ICD-10-CM | POA: Diagnosis not present

## 2021-06-27 DIAGNOSIS — K625 Hemorrhage of anus and rectum: Secondary | ICD-10-CM | POA: Diagnosis not present

## 2021-06-27 DIAGNOSIS — Z992 Dependence on renal dialysis: Secondary | ICD-10-CM | POA: Diagnosis not present

## 2021-06-27 DIAGNOSIS — K6289 Other specified diseases of anus and rectum: Secondary | ICD-10-CM | POA: Diagnosis not present

## 2021-06-27 DIAGNOSIS — Z7401 Bed confinement status: Secondary | ICD-10-CM | POA: Diagnosis not present

## 2021-06-27 DIAGNOSIS — I480 Paroxysmal atrial fibrillation: Secondary | ICD-10-CM | POA: Diagnosis not present

## 2021-06-27 LAB — COMPREHENSIVE METABOLIC PANEL
ALT: 31 U/L (ref 0–44)
AST: 31 U/L (ref 15–41)
Albumin: 3.3 g/dL — ABNORMAL LOW (ref 3.5–5.0)
Alkaline Phosphatase: 90 U/L (ref 38–126)
Anion gap: 11 (ref 5–15)
BUN: 33 mg/dL — ABNORMAL HIGH (ref 8–23)
CO2: 27 mmol/L (ref 22–32)
Calcium: 9 mg/dL (ref 8.9–10.3)
Chloride: 94 mmol/L — ABNORMAL LOW (ref 98–111)
Creatinine, Ser: 5.51 mg/dL — ABNORMAL HIGH (ref 0.61–1.24)
GFR, Estimated: 10 mL/min — ABNORMAL LOW (ref >60)
Glucose, Bld: 109 mg/dL — ABNORMAL HIGH (ref 70–99)
Potassium: 4 mmol/L (ref 3.5–5.1)
Sodium: 132 mmol/L — ABNORMAL LOW (ref 135–145)
Total Bilirubin: 1.1 mg/dL (ref 0.3–1.2)
Total Protein: 6.7 g/dL (ref 6.5–8.1)

## 2021-06-27 LAB — CBC WITH DIFFERENTIAL/PLATELET
Abs Immature Granulocytes: 0.02 10*3/uL (ref 0.00–0.07)
Basophils Absolute: 0 10*3/uL (ref 0.0–0.1)
Basophils Relative: 0 %
Eosinophils Absolute: 0.1 10*3/uL (ref 0.0–0.5)
Eosinophils Relative: 3 %
HCT: 31.7 % — ABNORMAL LOW (ref 39.0–52.0)
Hemoglobin: 10.4 g/dL — ABNORMAL LOW (ref 13.0–17.0)
Immature Granulocytes: 0 %
Lymphocytes Relative: 17 %
Lymphs Abs: 0.8 10*3/uL (ref 0.7–4.0)
MCH: 32.1 pg (ref 26.0–34.0)
MCHC: 32.8 g/dL (ref 30.0–36.0)
MCV: 97.8 fL (ref 80.0–100.0)
Monocytes Absolute: 0.5 10*3/uL (ref 0.1–1.0)
Monocytes Relative: 11 %
Neutro Abs: 3.3 10*3/uL (ref 1.7–7.7)
Neutrophils Relative %: 69 %
Platelets: 148 10*3/uL — ABNORMAL LOW (ref 150–400)
RBC: 3.24 MIL/uL — ABNORMAL LOW (ref 4.22–5.81)
RDW: 18.4 % — ABNORMAL HIGH (ref 11.5–15.5)
WBC: 4.8 10*3/uL (ref 4.0–10.5)
nRBC: 0 % (ref 0.0–0.2)

## 2021-06-27 LAB — PHOSPHORUS: Phosphorus: 3.7 mg/dL (ref 2.5–4.6)

## 2021-06-27 LAB — MAGNESIUM: Magnesium: 2.6 mg/dL — ABNORMAL HIGH (ref 1.7–2.4)

## 2021-06-27 LAB — GLUCOSE, CAPILLARY
Glucose-Capillary: 118 mg/dL — ABNORMAL HIGH (ref 70–99)
Glucose-Capillary: 128 mg/dL — ABNORMAL HIGH (ref 70–99)
Glucose-Capillary: 96 mg/dL (ref 70–99)

## 2021-06-27 NOTE — Progress Notes (Signed)
Physical Therapy Treatment Patient Details Name: Ruben [name] MRN: [id number] DOB: Jan 11, 1946 Today's Date: 06/27/2021   History of Present Illness Pt is 76 y.o. male admitted 06/07/21 with ongoing c/o vomiting/rectal pain. Imaging (+) for bibasilar atelectasis, lesions in liver/kidneys, possible cholecystitis, possible AVN B femoral heads. S/p flexible sigmoidoscopy 2/10 revealing mucosal ulceration, proctitis. PMH includes arthritis, ESRD on dialysis, ETOH abuse, HTN, MI    PT Comments    Pt received supine and agreeable to session with good tolerance for progression of gait with drastic improvement in endurance, alertness and engagement compared to HD day yesterday. Pt requiring up to min guard for transfers and ambulation with cues throughout for RW proximity and maintaining an upright posture. Pt able to increase gait speed slightly with cueing. Educated pt re: importance of nutrition and hydration as well as continued mobility. Pt continues to benefit from skilled PT services to progress toward functional mobility goals.    Recommendations for follow up therapy are one component of a multi-disciplinary discharge planning process, led by the attending physician.  Recommendations may be updated based on patient status, additional functional criteria and insurance authorization.  Follow Up Recommendations  Skilled nursing-short term rehab (<3 hours/day)     Assistance Recommended at Discharge Frequent or constant Supervision/Assistance  Patient can return home with the following A lot of help with walking and/or transfers;A lot of help with bathing/dressing/bathroom;Assistance with cooking/housework;Assistance with feeding;Direct supervision/assist for medications management;Direct supervision/assist for financial management;Assist for transportation;Help with stairs or ramp for entrance   Equipment Recommendations  None recommended by PT    Recommendations for Other Services        Precautions / Restrictions Precautions Precautions: Fall Precaution Comments: falls with fatigue Restrictions Weight Bearing Restrictions: No     Mobility  Bed Mobility Overal bed mobility: Modified Independent Bed Mobility: Supine to Sit     Supine to sit: Modified independent (Device/Increase time)     General bed mobility comments: increased time needed    Transfers Overall transfer level: Needs assistance Equipment used: Rolling walker (2 wheels) Transfers: Sit to/from Stand Sit to Stand: Min guard           General transfer comment: from EOB for safety    Ambulation/Gait Ambulation/Gait assistance: Supervision, Min guard Gait Distance (Feet): 150 Feet Assistive device: Rolling walker (2 wheels) Gait Pattern/deviations: Step-through pattern, Decreased step length - right, Decreased step length - left, Trunk flexed, Narrow base of support Gait velocity: Decreased     General Gait Details: steady gait with RW and intial min guard for balance, progressing to supervision mod cues for RW proximity and forward gaze/upright posture   Stairs             Wheelchair Mobility    Modified Rankin (Stroke Patients Only)       Balance Overall balance assessment: Needs assistance Sitting-balance support: Feet supported, Bilateral upper extremity supported Sitting balance-[name] Scale: Fair     Standing balance support: Bilateral upper extremity supported, Reliant on assistive device for balance, During functional activity Standing balance-[name] Scale: Poor                              Cognition Arousal/Alertness: Awake/alert Behavior During Therapy: Flat affect Overall Cognitive Status: Within Functional Limits for tasks assessed  Exercises      General Comments General comments (skin integrity, edema, etc.): VSS on RA      Pertinent Vitals/Pain Pain Assessment Pain  Assessment: No/denies pain    Home Living                          Prior Function            PT Goals (current goals can now be found in the care plan section) Acute Rehab PT Goals PT Goal Formulation: With patient Time For Goal Achievement: 06/29/21    Frequency    Min 3X/week      PT Plan Current plan remains appropriate    Co-evaluation              AM-PAC PT "6 Clicks" Mobility   Outcome Measure  Help needed turning from your back to your side while in a flat bed without using bedrails?: A Little Help needed moving from lying on your back to sitting on the side of a flat bed without using bedrails?: A Little Help needed moving to and from a bed to a chair (including a wheelchair)?: A Little Help needed standing up from a chair using your arms (e.g., wheelchair or bedside chair)?: A Little Help needed to walk in hospital room?: A Little Help needed climbing 3-5 steps with a railing? : Total 6 Click Score: 16    End of Session Equipment Utilized During Treatment: Gait belt Activity Tolerance: Patient tolerated treatment well Patient left: in bed;with family/visitor present Nurse Communication: Mobility status PT Visit Diagnosis: Unsteadiness on feet (R26.81);Other abnormalities of gait and mobility (R26.89);Pain     Time: 1441-1500 PT Time Calculation (min) (ACUTE ONLY): 19 min  Charges:  $Gait Training: 8-22 mins                     Kerry-Anne Mezo R. PTA Acute Rehabilitation Services Office: [place] 06/27/2021, 3:13 PM

## 2021-06-27 NOTE — Progress Notes (Signed)
Occupational Therapy Treatment Patient Details Name: Ruben [name] MRN: [id number] DOB: 1946-03-02 Today's Date: 06/27/2021   History of present illness Pt is 76 y.o. male admitted 06/07/21 with ongoing c/o vomiting/rectal pain. Imaging (+) for bibasilar atelectasis, lesions in liver/kidneys, possible cholecystitis, possible AVN B femoral heads. S/p flexible sigmoidoscopy 2/10 revealing mucosal ulceration, proctitis. PMH includes arthritis, ESRD on dialysis, ETOH abuse, HTN, MI   OT comments  Pt up in chair upon arrival. Agreeable to participating in grooming and UB dressing which he completed in sitting with set up to min assist. Pt with poor activity tolerance, requires increased time and effort for all activities with intermittent rest breaks.    Recommendations for follow up therapy are one component of a multi-disciplinary discharge planning process, led by the attending physician.  Recommendations may be updated based on patient status, additional functional criteria and insurance authorization.    Follow Up Recommendations  Skilled nursing-short term rehab (<3 hours/day)    Assistance Recommended at Discharge Frequent or constant Supervision/Assistance  Patient can return home with the following  A little help with walking and/or transfers;A lot of help with bathing/dressing/bathroom;Assistance with cooking/housework;Assist for transportation   Equipment Recommendations  BSC/3in1;Tub/shower seat;Wheelchair (measurements OT);Wheelchair cushion (measurements OT)    Recommendations for Other Services      Precautions / Restrictions Precautions Precautions: Fall Precaution Comments: falls with fatigue Restrictions Weight Bearing Restrictions: No       Mobility Bed Mobility               General bed mobility comments: up in chair    Transfers Overall transfer level: Needs assistance Equipment used: Rolling walker (2 wheels) Transfers: Sit to/from Stand Sit to  Stand: Min assist           General transfer comment: from chair     Balance Overall balance assessment: Needs assistance Sitting-balance support: Feet supported, Bilateral upper extremity supported Sitting balance-[name] Scale: Fair     Standing balance support: Bilateral upper extremity supported, Reliant on assistive device for balance, During functional activity Standing balance-[name] Scale: Poor                             ADL either performed or assessed with clinical judgement   ADL Overall ADL's : Needs assistance/impaired     Grooming: Wash/dry hands;Wash/dry face;Oral care;Minimal assistance;Sitting Grooming Details (indicate cue type and reason): sitting due to fatigue         Upper Body Dressing : Minimal assistance;Sitting Upper Body Dressing Details (indicate cue type and reason): seated in chair                 Functional mobility during ADLs: Rolling walker (2 wheels);Minimal assistance General ADL Comments: Began educating pt in energy conservation strategies.    Extremity/Trunk Assessment              Vision       Perception     Praxis      Cognition Arousal/Alertness: Awake/alert Behavior During Therapy: Flat affect Overall Cognitive Status: Within Functional Limits for tasks assessed                                 General Comments: pt closing eyes when not stimulated        Exercises      Shoulder Instructions       General Comments  Pertinent Vitals/ Pain       Pain Assessment Pain Assessment: No/denies pain  Home Living                                          Prior Functioning/Environment              Frequency  Min 2X/week        Progress Toward Goals  OT Goals(current goals can now be found in the care plan section)  Progress towards OT goals: Not progressing toward goals - comment (pt with fatigue)  Acute Rehab OT Goals OT Goal Formulation:  With patient Time For Goal Achievement: 06/29/21 Potential to Achieve Goals: [place] Discharge plan needs to be updated;Frequency remains appropriate    Co-evaluation                 AM-PAC OT "6 Clicks" Daily Activity     Outcome Measure   Help from another person eating meals?: None Help from another person taking care of personal grooming?: A Little Help from another person toileting, which includes using toliet, bedpan, or urinal?: A Little Help from another person bathing (including washing, rinsing, drying)?: A Lot Help from another person to put on and taking off regular upper body clothing?: A Little Help from another person to put on and taking off regular lower body clothing?: A Lot 6 Click Score: 17    End of Session Equipment Utilized During Treatment: Rolling walker (2 wheels);Gait belt  OT Visit Diagnosis: Unsteadiness on feet (R26.81);Other abnormalities of gait and mobility (R26.89);Muscle weakness (generalized) (M62.81);Pain   Activity Tolerance Patient limited by fatigue   Patient Left in chair;with call bell/phone within reach;with chair alarm set   Nurse Communication          Time: [phone] OT Time Calculation (min): 18 min  Charges: OT General Charges $OT Visit: 1 Visit OT Treatments $Self Care/Home Management : 8-22 mins  Nestor Lewandowsky, OTR/L Acute Rehabilitation Services Pager: [phone] Office: [phone]   Malka So 06/27/2021, 12:53 PM

## 2021-06-27 NOTE — Discharge Summary (Signed)
Physician Discharge Summary  Ruben [name] ZOX:[id number] DOB: 1945-10-16 DOA: 06/07/2021  PCP: [name], FNP  Admit date: 06/07/2021 Discharge date: 06/27/2021  Admitted From: Home Disposition: Nursing home  Recommendations for Outpatient Follow-up:  Follow up with PCP in 1-2 weeks Please obtain BMP/CBC in one week Please follow up with nephrology Please make sure patient is not constipated he needs to have at least 2 bowel movements daily.  Home Health: None Equipment/Devices: None Discharge Condition: Stable CODE STATUS: Full code Diet recommendation: Renal cardiac  Brief/Interim Summary:  76 y.o. BM PMHx HTN, CAD, recent hospitalization for non-STEMI and acute systolic CHF with cardiogenic shock requiring ICU stay and inotrope support, A. fib with RVR, HLD, ESRD on HD M/W/F,  prior alcohol abuse, and tobacco abuse    Presents with complaints of rectal pain and difficulty swallowing over the last 2 weeks.  His most recent hospital stay was prolonged, he was here for almost a month.  He has declined since with weight loss, poor p.o. intake, dysphagia as well as increased constipation.  There was concern for acute cholecystitis on admission and general surgery was consulted.  Initial imaging showed some nonspecific liver lesions, on a noncontrast study, and also patient complained of dysphagia for which GI was consulted. Overall improving, his liver lesions are not significant. On 2/17 he was supposed to go home but became more dyspneic, underwent HD x 2 2/17 and 2/18. Discharge Diagnoses:  Active Problems:   HLD (hyperlipidemia)   ESRD on hemodialysis (HCC)   Weight loss   Suspected acute cholecystitis   Rectal pain and constipation   Dysphagia   Paroxysmal atrial fibrillation (HCC)   Chronic systolic CHF (congestive heart failure) (HCC)   ETOH abuse   Normocytic anemia   Transaminitis   CAD (coronary artery disease)   Liver lesions, elevated LFTs   Anxiety and  Depression    #1 fecal impaction/rectal ulceration and proctitis -status post manual disimpaction and is on aggressive bowel regime. He was disimpacted by GI.  Flexible sigmoidoscopy 06/14/2021 showed rectal ulceration and proctitis.  Biopsies showed focal erosion.   #2 chronic hypoxic respiratory failure secondary to fluid overload he underwent extra session of dialysis on 06/21/2021 and 06/22/2021.  Chest x-ray at that time showed fluid overload.  Continue dialysis Monday Wednesday Friday.  #3 chronic systolic CHF with ejection fraction 35 to 40% with moderate mitral regurgitation.  Continue midodrine.  Patient with recent cardiogenic shock and NSTEMI was in the hospital for close to a month.   #4 history of CAD/recent NSTEMI December 2022 underwent cardiac catheterization with RCA occlusion with diffuse disease of the LAD and left circumflex.  It was not amenable to PCI and he was not a surgical candidate for CABG.   #5 paroxysmal atrial fibrillation on sinus rhythm no apixaban 5 mg twice a day.   #6 ESRD on hemodialysis Monday Wednesday and Friday he has a right AV fistula placed recently.  Patient is currently receiving dialysis through an HD cath.   #7 anxiety and depression on Remeron and hydroxyzine   #8 liver lesions and elevated LFTs he has multiple low-attenuation lesions measuring about 12 mm in diameter in the liver.  MRI shows fairly benign lesions.    Nutrition Problem: Severe Malnutrition (in the context of chronic illness) Etiology: poor appetite    Signs/Symptoms: severe fat depletion, severe muscle depletion, percent weight loss (13.4% x 4 months) Percent weight loss: 13.4 %     Interventions: Refer to RD  note for recommendations  Estimated body mass index is 19.19 kg/m as calculated from the following:   Height as of this encounter: _0  (1.803 m).   Weight as of this encounter: 62.4 kg.  Discharge Instructions  Discharge Instructions     Diet - low  sodium heart healthy   Complete by: As directed    Increase activity slowly   Complete by: As directed    No wound care   Complete by: As directed       Allergies as of 06/27/2021       Reactions   Other Other (See Comments)   Pollen - sneezing, itching/watery eyes        Medication List     STOP taking these medications    polyethylene glycol powder 17 GM/SCOOP powder Commonly known as: GLYCOLAX/MIRALAX Replaced by: polyethylene glycol 17 g packet   traMADol 50 MG tablet Commonly known as: Ultram       TAKE these medications    acetaminophen 650 MG CR tablet Commonly known as: TYLENOL Take 650 mg by mouth every 8 (eight) hours as needed for pain.   albuterol (2.5 MG/3ML) 0.083% nebulizer solution Commonly known as: PROVENTIL Take 3 mLs (2.5 mg total) by nebulization every 4 (four) hours as needed for wheezing or shortness of breath.   amiodarone 200 MG tablet Commonly known as: PACERONE Take 1 tablet (200 mg total) by mouth daily.   atorvastatin 80 MG tablet Commonly known as: LIPITOR Take 1 tablet (80 mg total) by mouth daily.   cholecalciferol 25 MCG (1000 UNIT) tablet Commonly known as: VITAMIN D3 Take 1,000 Units by mouth daily.   diltiazem 2 % Gel Apply 1 application topically 2 (two) times daily. Apply to anal fissures   Eliquis 5 MG Tabs tablet Generic drug: apixaban Take 1 tablet (5 mg total) by mouth 2 (two) times daily.   hydrocortisone 25 MG suppository Commonly known as: ANUSOL-HC Place 1 suppository (25 mg total) rectally 2 (two) times daily.   hydrOXYzine 10 MG tablet Commonly known as: ATARAX Take 1 tablet (10 mg total) by mouth every Monday, Wednesday, and Friday at 6 PM.   Lidocaine-Hydrocortisone Ace 3-2.5 % Kit Place 1 application rectally 2 (two) times daily.   Magnesium 200 MG Tabs Take 1 tablet (200 mg total) by mouth daily. With evening meal What changed: additional instructions   midodrine 10 MG tablet Commonly  known as: PROAMATINE Take 1 tablet (10 mg total) by mouth 3 (three) times daily with meals.   mirtazapine 7.5 MG tablet Commonly known as: REMERON Take 1 tablet (7.5 mg total) by mouth at bedtime.   multivitamin Tabs tablet Take 1 tablet by mouth at bedtime.   nitroGLYCERIN 0.4 MG SL tablet Commonly known as: Nitrostat Place 1 tablet (0.4 mg total) under the tongue every 5 (five) minutes as needed for chest pain.   ondansetron 4 MG tablet Commonly known as: Zofran Take 1 tablet (4 mg total) by mouth daily as needed for nausea or vomiting.   oxyCODONE 5 MG immediate release tablet Commonly known as: Oxy IR/ROXICODONE Take 1 tablet (5 mg total) by mouth every 6 (six) hours as needed for moderate pain.   phenylephrine 0.25 % suppository Commonly known as: (USE for PREPARATION-H) Place 1 suppository rectally 2 (two) times daily.   polyethylene glycol 17 g packet Commonly known as: MIRALAX / GLYCOLAX Take 17 g by mouth 2 (two) times daily. Replaces: polyethylene glycol powder 17 GM/SCOOP powder   senna-docusate  8.6-50 MG tablet Commonly known as: Senokot-S Take 2 tablets by mouth 2 (two) times daily.               Durable Medical Equipment  (From admission, onward)           Start     Ordered   06/24/21 0855  For home use only DME oxygen  Once       Comments: SATURATION QUALIFICATIONS: (This note is used to comply with regulatory documentation for home oxygen) Patient Saturations on Room Air at Rest = 96% Patient Saturations on Room Air while Ambulating = 92%-94% Patient Saturations on n/a Liters of oxygen while Ambulating = n/a% -Patient meets criteria for home O2 - 2 L O2 via [place].  Titrate to maintain SPO2> 92% - Provide [employer] portable home O2 generator  Question Answer Comment  Length of Need Lifetime   Mode or (Route) Nasal cannula   Liters per Minute 2   Frequency Continuous (stationary and portable oxygen unit needed)   Oxygen conserving device Yes    Oxygen delivery system Gas      06/24/21 0854   06/20/21 1507  For home use only DME Other see comment  Once       Comments: Transport chair  Question:  Length of Need  Answer:  Lifetime   06/20/21 1506            Contact information for follow-up providers     [facility].   Specialty: Home Health Services Contact information: [place] [id number] [phone]              Contact information for after-discharge care     Destination     [facility] Preferred SNF .   Service: Skilled Nursing Contact information: [place] [phone]                    Allergies  Allergen Reactions   Other Other (See Comments)    Pollen - sneezing, itching/watery eyes    Consultations: GI and general surgery   Procedures/Studies: CT Abdomen Pelvis Wo Contrast  Result Date: 06/07/2021 CLINICAL DATA:  Rectal pain.  Vomiting.  Dialysis today. EXAM: CT ABDOMEN AND PELVIS WITHOUT CONTRAST TECHNIQUE: Multidetector CT imaging of the abdomen and pelvis was performed following the standard protocol without IV contrast. RADIATION DOSE REDUCTION: This exam was performed according to the departmental dose-optimization program which includes automated exposure control, adjustment of the mA and/or kV according to patient size and/or use of iterative reconstruction technique. COMPARISON:  06/02/2021 FINDINGS: Lower chest: Small bilateral pleural effusions with basilar atelectasis, developing since prior study. Cardiac enlargement. Coronary artery calcifications. Hepatobiliary: Low-attenuation lesions measuring up to about 12 mm diameter demonstrated in the liver, most prominently in the left lobe. These are incompletely characterized on noncontrast imaging. If the patient is able to have contrast material, repeat CT with contrast recommended. Otherwise, consider  ultrasound or MRI in follow-up to exclude liver mass or metastasis. Diffusely increased density of the gallbladder, more prominent than on prior study. This may represent milk of calcium sludge or large stones. The gallbladder wall appears thickened and cholecystitis is likely. No bile duct dilatation. Pancreas: Unenhanced appearance is unremarkable. Spleen: Unenhanced appearance is unremarkable. Adrenals/Urinary Tract: No adrenal gland nodules. Multiple poorly defined low-attenuation lesions throughout both kidneys, largest measuring about 1.8 cm diameter. Some of these are hyperdense.  These are incompletely characterized on noncontrast imaging but probably represent simple and hemorrhagic cysts. No change in appearance since prior study. Consider follow-up on MRI or contrast-enhanced CT as previously recommended. No hydronephrosis or hydroureter. Bladder is unremarkable. Stomach/Bowel: Stomach, small bowel, and colon are not abnormally distended. The colon is filled with residual contrast material. Appendix is normal. Vascular/Lymphatic: Calcification of the aorta. No aneurysm. Moderately prominent lymph nodes in the abdominal hiatus and gastrohepatic ligament. These are nonspecific but could represent metastatic disease or inflammatory nodes. Reproductive: Prostate gland is moderately enlarged. Other: No free air or free fluid in the abdomen. Abdominal wall musculature appears intact. Probable postoperative changes in the right inguinal region. Musculoskeletal: Degenerative changes in the spine. Sclerotic and lucent changes in the femoral heads bilaterally, greater on the right, likely representing avascular necrosis. IMPRESSION: 1. Small bilateral pleural effusions with basilar atelectasis, developing since prior study. 2. Indeterminate lesions in the liver and kidneys as discussed above. Consider follow-up with ultrasound, contrast-enhanced CT or MRI. 3. Increasing density of the gallbladder, likely milk of  calcium or possibly large stones. Gallbladder wall thickening. Changes may represent acute cholecystitis. 4. No evidence of bowel obstruction or inflammation. Residual contrast material throughout the colon. 5. 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 6. Aortic atherosclerosis. Electronically Signed   By: Lucienne Capers M.D.   On: 06/07/2021 18:31   CT ABDOMEN PELVIS WO CONTRAST  Result Date: 06/02/2021 CLINICAL DATA:  76 year old male with history of acute onset of nonlocalized abdominal pain. Abdominal distension. Constipation for 1 week. EXAM: CT ABDOMEN AND PELVIS WITHOUT CONTRAST TECHNIQUE: Multidetector CT imaging of the abdomen and pelvis was performed following the standard protocol without IV contrast. RADIATION DOSE REDUCTION: This exam was performed according to the departmental dose-optimization program which includes automated exposure control, adjustment of the mA and/or kV according to patient size and/or use of iterative reconstruction technique. COMPARISON:  CT the abdomen and pelvis 08/10/2017. FINDINGS: Lower chest: Atherosclerotic calcifications in the thoracic aorta as well as the left main, left anterior descending, left circumflex and right coronary arteries. Central venous catheter tip terminating in the right atrium. Hepatobiliary: Multiple low-attenuation lesions scattered throughout the liver, similar in size and number to prior study, incompletely characterized on today's noncontrast CT examination, but likely to represent cysts. There is some amorphous high attenuation material lying dependently in the gallbladder, compatible with biliary sludge and/or tiny partially calcified gallstones. Gallbladder is not distended. No pericholecystic fluid or surrounding inflammatory changes to suggest an acute cholecystitis at this time. Pancreas: No definite pancreatic mass or peripancreatic fluid collections or inflammatory changes are noted on  today's noncontrast CT examination. Spleen: Unremarkable. Adrenals/Urinary Tract: Low-attenuation lesions in the kidneys bilaterally measuring up to 1.7 cm in the upper pole of the right kidney, incompletely characterized on today's non-contrast CT examination, but statistically likely to represent cysts. In addition, in the lateral aspect of the interpolar region of the left kidney there is a 1 cm high attenuation lesion which is incompletely characterize, but likely a proteinaceous/hemorrhagic cyst. Bilateral adrenal glands are normal in appearance. No hydroureteronephrosis. Urinary bladder is normal in appearance. Stomach/Bowel: Unenhanced appearance of the stomach is normal. There is no pathologic dilatation of small bowel or colon. A few scattered colonic diverticulae are noted, without surrounding inflammatory changes to suggest an acute diverticulitis at this time. Normal appendix. Vascular/Lymphatic: Aortic atherosclerosis, aortic atherosclerosis. No lymphadenopathy noted in the abdomen or pelvis. Reproductive: Prostate gland and seminal vesicles are  unremarkable in appearance. Other: No significant volume of ascites.  No pneumoperitoneum. Musculoskeletal: There are no aggressive appearing lytic or blastic lesions noted in the visualized portions of the skeleton. IMPRESSION: 1. No acute findings are noted in the abdomen or pelvis to account for the patient's symptoms. 2. Colonic diverticulosis without evidence of acute diverticulitis at this time. 3. Numerous renal lesions bilaterally, incompletely characterized on today's noncontrast examination, but likely to represent a combination of small cysts and proteinaceous/hemorrhagic cysts. These could be definitively characterized with follow-up nonemergent abdominal MRI with and without IV gadolinium if of clinical concern. 4. Biliary sludge and/or tiny partially calcified gallstones lying dependently in the gallbladder. No findings to suggest an acute  cholecystitis at this time. 5. Aortic atherosclerosis, in addition to left main and three-vessel coronary artery disease. Assessment for potential risk factor modification, dietary therapy or pharmacologic therapy may be warranted, if clinically indicated. 6. Additional incidental findings, as above. Electronically Signed   By: Vinnie Langton M.D.   On: 06/02/2021 10:27   DG Chest 2 View  Result Date: 06/08/2021 CLINICAL DATA:  Wheezing and shortness of breath. EXAM: CHEST - 2 VIEW COMPARISON:  06/02/2021 and prior studies FINDINGS: The cardiomediastinal silhouette is unremarkable. Increasing bibasilar opacities are present which may represent atelectasis or airspace disease. A RIGHT IJ central venous catheter is again noted with tip overlying the UPPER RIGHT atrium. There is no evidence of pneumothorax or large pleural effusion. IMPRESSION: Increasing bibasilar opacities which may represent atelectasis or airspace disease. Electronically Signed   By: Margarette Canada M.D.   On: 06/08/2021 19:40   DG Abd 1 View  Result Date: 06/25/2021 CLINICAL DATA:  Constipation EXAM: ABDOMEN - 1 VIEW COMPARISON:  Previous studies including the examination of 06/09/2021 FINDINGS: There is moderate gaseous distention of stomach. There are few slightly dilated small bowel loops. Gas and stool are noted in colon. Tip of large caliber central venous catheter is seen in the region of right atrium. There is blunting of left lateral CP angle. IMPRESSION: There is moderate gaseous distention of stomach and mild dilation of small-bowel loops suggesting ileus. Electronically Signed   By: Elmer Picker M.D.   On: 06/25/2021 16:15   DG Abd 1 View  Result Date: 06/09/2021 CLINICAL DATA:  Abdominal pain, vomiting EXAM: ABDOMEN - 1 VIEW COMPARISON:  None. FINDINGS: There is mild dilation of small-bowel loops. Gas and stool are present in colon. Stomach is not distended. No abnormal masses or calcifications are seen. Sclerosis seen  in the head of the right femur suggests possible avascular necrosis. IMPRESSION: Presence of gas in slightly dilated small bowel loops may suggest ileus. Possible avascular necrosis in the head of the right femur. Electronically Signed   By: Elmer Picker M.D.   On: 06/09/2021 12:31   CT ABDOMEN PELVIS W CONTRAST  Result Date: 06/12/2021 CLINICAL DATA:  Indeterminate liver lesions, with mildly prominent retrocrural lymph nodes on noncontrast CT. Study is performed as metastatic disease evaluation. EXAM: CT ABDOMEN AND PELVIS WITH CONTRAST TECHNIQUE: Multidetector CT imaging of the abdomen and pelvis was performed using the standard protocol following bolus administration of intravenous contrast. RADIATION DOSE REDUCTION: This exam was performed according to the departmental dose-optimization program which includes automated exposure control, adjustment of the mA and/or kV according to patient size and/or use of iterative reconstruction technique. CONTRAST:  63m OMNIPAQUE IOHEXOL 300 MG/ML  SOLN COMPARISON:  Noncontrast abdomen and pelvis CTs recently 06/07/2021 and 06/02/2021 FINDINGS: Lower chest: Small to moderate increased  right pleural effusion with extension into the major fissure. Unchanged trace left pleural fluid. There is compressive atelectasis or consolidation in the right lower lobe adjacent the effusion. Increased linear atelectasis right middle lobe. Moderate slightly worsened panchamber cardiomegaly. There are distended central pulmonary veins, three-vessel calcific CAD and a dialysis catheter terminating in the upper right atrium. There is IVC and hepatic vein reflux indicating elevated right heart pressures. Also the pulmonary trunk is prominent at 3.5 cm indicating arterial hypertension, the ascending aorta borderline aneurysmal at 3.9 cm. Hepatobiliary: Again noted are 2 irregular low-density lesions in the left lobe of the liver, with a 1.4 cm lesion near the dome and a 1.3 cm lesion in  the lateral segment. There is an additional indeterminate low-density lesion along the intersegmental division measuring 1.5 cm. These lesions measure above the usual density of fluid. There are additional too small to characterize scattered hypodensities in the liver elsewhere . The larger lesions remain indeterminate and certainly could represent metastases. Liver is 19.5 cm length mildly steatotic and otherwise unremarkable. There is dense contrast in the gallbladder, prior studies showing layering sludge or gravel. There is mild thickening of the free wall which was noted on both prior studies and likely is congestive with trace pericholecystic fluid and small-volume perihepatic ascites which is increased. Pancreas: Unremarkable. Spleen: Unremarkable. Adrenals/Urinary Tract: There is slight nodular thickening of both adrenal glands. There are small bilateral renal cysts, on right largest is 1.9 cm in 15.9 Hounsfield units in the superior pole laterally. On the left largest is in the anterior hilar cortex measuring 1.9 cm and 25 Hounsfield units and does not show any change in density between the noncontrast studies and today's arterial phase images and venous delayed phase images. There are multiple additional tiny hypodensities in both kidneys which are too small to characterize. There are scattered renal vascular calcifications at the renal hila but there are no collecting system stones, hydronephrosis or findings of acute pyelonephritis. There is mild generalized bladder thickening versus changes of underdistention. The bladder was normal in thickness on the last CT. Stomach/Bowel: No dilatation or wall thickening through the sigmoid segment including the contrast filled appendix. There are left colonic diverticula without evidence of acute diverticulitis. Moderate fold thickening does appear present in the rectum, which was previously stool-filled. Vascular/Lymphatic: There is moderate to heavy mixed  aortoiliac plaque disease. There are short-segment dissections in both common iliac arteries but there is no aortic dissection or aneurysm. Some of the soft aortic plaque is ulcerative but no penetrating aortic ulcer or inflammatory changes are seen. Soft plaque causes up to 75-80% luminal stenosis in the distal left external iliac artery. 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 The visualized proximal superficial femoral arteries and common femoral arteries patent. The major visceral arteries are likewise patent. As described previously there are several slightly prominent retrocrural lymph nodes, largest measuring 10 mm in short axis. No further adenopathy is seen. Reproductive: Moderately enlarged prostate. Other: There is mild but increased body wall and mesenteric congestive features. There is small-volume abdominal and pelvic free ascites, but not sufficient for paracentesis. There is no free air or incarcerated hernia. Musculoskeletal: There are chronic lesions of osteonecrosis in both superior femoral heads, without deformity of the femoral head articular surfaces. There are dense bones which may suggest renal osteodystrophy. There are degenerative changes of the lumbar spine. No worrisome regional bone lesion is seen. Bridging osteophytes anterior left SI joint. IMPRESSION: 1. 3  small indeterminate lesions in the liver above the usual density of fluid. All 3 are in the left lobe, and additional scattered too small to characterize hypodensities elsewhere. Liver dedicated MRI is recommended. 2. Small renal cysts and additional bilateral too small to characterize cortical hypodensities. Attention to this on MRI recommended. 3. Slightly prominent retrocrural lymph nodes but no further adenopathy is seen. 4. Cardiomegaly with evidence of arterial hypertension, and IVC reflux consistent with elevated right heart pressures. 5. Aortic atherosclerosis. Borderline aneurysmal ectasia  ascending thoracic aorta with moderate/heavy mixed abdominal aortic/iliac plaque. Some of aortic soft plaque is ulcerative but no penetrating ulcer is seen. 6. 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 7. Moderate fold thickening in the rectum. This could be due to proctitis, muscular hypertrophy or infiltrating disease. Further evaluation recommended. 8. Mild bladder thickening versus underdistention. 9. Increased right pleural effusion with adjacent consolidation or atelectasis, increased congestive changes in the body wall and mesentery and increased small-volume abdominal and pelvic ascites. 10. Mild gallbladder thickening, likely congestive. Correlate clinically for less likely cholecystitis. 11. Prostatomegaly and remaining findings described above. Electronically Signed   By: Telford Nab M.D.   On: 06/12/2021 23:07   MR LIVER W WO CONTRAST  Result Date: 06/17/2021 CLINICAL DATA:  Inpatient. Indeterminate left liver lesions on recent CT. EXAM: MRI ABDOMEN WITHOUT AND WITH CONTRAST TECHNIQUE: Multiplanar multisequence MR imaging of the abdomen was performed both before and after the administration of intravenous contrast. CONTRAST:  27m GADAVIST GADOBUTROL 1 MMOL/ML IV SOLN COMPARISON:  06/12/2021 CT abdomen/pelvis. FINDINGS: Substantially motion degraded scan, particularly on the postcontrast sequences, limiting assessment. Lower chest: Small dependent right pleural effusion with dependent right lung base atelectasis, unchanged from recent CT. Mild cardiomegaly. Hepatobiliary: Normal liver size and configuration. No hepatic steatosis. Numerous benign-appearing liver cysts scattered throughout the liver, several of which are lobulated and demonstrate thin internal septations, for example measuring 1.5 cm in the segment 4A left liver (series 4/image 15)  and 1.5 cm in the central left liver (series 4/image 19), stable in size since 08/10/2017 CT abdomen study. No suspicious liver masses. Layering tiny gallstones and layering mild sludge in the gallbladder. Moderate diffuse gallbladder wall thickening. No significant gallbladder distention. No biliary ductal dilatation. Common bile duct diameter 2 mm. No convincing choledocholithiasis on these motion degraded imagesa. No biliary masses, strictures or beading. Pancreas: No pancreatic mass or duct dilation.  No pancreas divisum. Spleen: Normal size. No mass. Adrenals/Urinary Tract: Normal adrenals. No hydronephrosis. Several simple renal cortical cysts scattered throughout both kidneys, largest 1.7 cm in the upper right kidney and 1.9 cm in the interpolar anterior left kidney. Homogeneous T2 hypointense 1.0 cm medial interpolar right renal cortical lesion (series 4/image 21) and homogeneous T2 hypointense subcentimeter lateral upper left renal cortical lesion (series 3/image 13), poorly assessed for enhancement on the substantially motion degraded postcontrast sequences. Stomach/Bowel: Normal non-distended stomach. Visualized small and large bowel is normal caliber, with no bowel wall thickening. Vascular/Lymphatic: Atherosclerotic nonaneurysmal abdominal aorta. Patent portal, splenic, hepatic and renal veins. No pathologically enlarged lymph nodes in the abdomen. Other: Trace perihepatic ascites.  No focal fluid collections. Musculoskeletal: No aggressive appearing focal osseous lesions. IMPRESSION: 1. Substantially motion degraded scan, limiting assessment. 2. Benign liver cysts, stable since 2019 CT. No suspicious liver lesions. 3. Two small indeterminate T2 hypointense renal cortical lesions, largest 1.0 cm  in the medial interpolar right kidney, for which assessment for enhancement is nondiagnostic due to substantial motion degradation. Follow-up MRI abdomen without and with IV contrast recommended in 6 months. 4.  Cholelithiasis and mild gallbladder sludge. Moderate diffuse gallbladder wall thickening, nonspecific, without significant gallbladder distention. No biliary ductal dilatation. No convincing choledocholithiasis on the motion degraded sequences. 5. Small dependent right pleural effusion with dependent right lung base atelectasis, unchanged from recent CT. 6. Trace perihepatic ascites. Electronically Signed   By: Ilona Sorrel M.D.   On: 06/17/2021 09:49   DG CHEST PORT 1 VIEW  Result Date: 06/21/2021 CLINICAL DATA:  Dyspnea EXAM: PORTABLE CHEST 1 VIEW COMPARISON:  06/08/2021, 06/12/2021 FINDINGS: Single frontal view of the chest demonstrates a stable right internal jugular dialysis catheter. The cardiac silhouette is enlarged. Persistent right pleural effusion and right basilar consolidation. Diffuse increased interstitial prominence consistent with interstitial edema. No pneumothorax. No acute bony abnormalities. IMPRESSION: 1. Stable diffuse increased interstitial prominence compatible with interstitial edema. 2. Stable right basilar consolidation and right effusion. Electronically Signed   By: Randa Ngo M.D.   On: 06/21/2021 18:48   DG Chest Port 1 View  Result Date: 06/02/2021 CLINICAL DATA:  Pt presents with constipation. No bm for a week, lots of rectal pain/pressure. Pt was hospitalized with heart attack in December and just dc recently due to kidney damage. Is new dialysis. EXAM: PORTABLE CHEST 1 VIEW COMPARISON:  05/15/2021. FINDINGS: Cardiac silhouette is normal in size. No mediastinal or hilar masses. Clear lungs.  No convincing pleural effusion and no pneumothorax. Right internal jugular tunneled dual lumen central venous catheter is new, tip projects in the lower superior vena cava. Skeletal structures are grossly intact. IMPRESSION: No acute cardiopulmonary disease. Electronically Signed   By: Lajean Manes M.D.   On: 06/02/2021 10:25   DG Swallowing Func-Speech Pathology  Result Date:  06/10/2021 Table formatting from the original result was not included. Objective Swallowing Evaluation: Type of Study: MBS-Modified Barium Swallow Study  Patient Details Name: Ruben [name] MRN: [id number] Date of Birth: 1945-11-03 Today's Date: 06/10/2021 Time: SLP Start Time (ACUTE ONLY): 1053 -SLP Stop Time (ACUTE ONLY): 7408 SLP Time Calculation (min) (ACUTE ONLY): 16 min Past Medical History: Past Medical History: Diagnosis Date  Arthritis   Chronic kidney disease, stage 3b ([place])   ESRD (end stage renal disease) ([place])   ETOH abuse   History of stress test 09/02/2008  showed inferolateral scar without ischemia  Hx of echocardiogram 09/02/2008  was essentially normal  Hyperlipidemia   Hypertension   Myocardial infarction [facility])   Tobacco abuse  Past Surgical History: Past Surgical History: Procedure Laterality Date  AV FISTULA PLACEMENT Left 05/27/2021  Procedure: LEFT ARTERIOVENOUS (AV) GRAFT CREATION;  Surgeon: Broadus John, MD;  Location: [place];  Service: Vascular;  Laterality: Left;  PERIPHERAL NERVE BLOCK  BACK SURGERY    Dr Luiz Ochoa involving L3-L4 discectomy.  CARDIOVERSION N/A 05/13/2021  Procedure: CARDIOVERSION;  Surgeon: Larey Dresser, MD;  [facility] ENDOSCOPY;  Service: Cardiovascular;  Laterality: N/A;  HERNIA REPAIR    IR FLUORO GUIDE CV LINE RIGHT  05/21/2021  IR US GUIDE VASC ACCESS RIGHT  05/21/2021  RIGHT/LEFT HEART CATH AND CORONARY ANGIOGRAPHY N/A 05/09/2021  Procedure: RIGHT/LEFT HEART CATH AND CORONARY ANGIOGRAPHY;  Surgeon: Larey Dresser, MD;  Location: [place] CV LAB;  Service: Cardiovascular;  Laterality: N/A;  SPINE SURGERY    TEE WITHOUT CARDIOVERSION N/A 05/07/2021  Procedure: TRANSESOPHAGEAL ECHOCARDIOGRAM (TEE);  Surgeon: Larey Dresser,  MD;  Location: [place];  Service: Cardiovascular;  Laterality: N/A;  TEE WITHOUT CARDIOVERSION N/A 05/13/2021  Procedure: TRANSESOPHAGEAL ECHOCARDIOGRAM (TEE);  Surgeon: Larey Dresser, MD;  Location: [facility] ENDOSCOPY;  Service:  Cardiovascular;  Laterality: N/A; HPI: Pt is a 76 y.o. male who presented with complaints of rectal pain and difficulty swallowing for 2 week prior to admission. Difficulty swallowing described in H&P as "like food and liquid gets stuck in his throat at times which causes him to vomit it back up." CXR on admission: Increasing bibasilar opacities which may represent atelectasis or airspace disease. GI consulted; MD recommended for esophagram as initial workup since pt's recent NSTEMI places him at high risk for endoscopic procedures; PA recommended MBS and esophagram if MBS is inconclusive. PMH: hypertension, hyperlipidemia, ESRD on HD, CAD, CHF, A. fib with RVR, prior alcohol abuse, NSTEMI, and tobacco abuse.  Subjective: pt nauseous  Recommendations for follow up therapy are one component of a multi-disciplinary discharge planning process, led by the attending physician.  Recommendations may be updated based on patient status, additional functional criteria and insurance authorization. Assessment / Plan / Recommendation Clinical Impressions 06/10/2021 Clinical Impression Pt's oropharyngeal swallowing is WFL across consistencies tested. He did have frequent gagging and dry heaving throughout testing, but no oropharyngeal difficulties that would account for his recent symptoms. From an oropharyngeal standpoint, he could advance to regular solids and thin liquids once medically able to do so. SLP to sign off acutely. SLP Visit Diagnosis Dysphagia, unspecified (R13.10) Attention and concentration deficit following -- Frontal lobe and executive function deficit following -- Impact on safety and function Risk for inadequate nutrition/hydration;Mild aspiration risk   Treatment Recommendations 06/10/2021 Treatment Recommendations No treatment recommended at this time   Prognosis 06/10/2021 Prognosis for Safe Diet Advancement Good Barriers to Reach Goals -- Barriers/Prognosis Comment -- Diet Recommendations 06/10/2021 SLP Diet  Recommendations Regular solids;Thin liquid Liquid Administration via Cup;Straw Medication Administration Whole meds with liquid Compensations Slow rate;Small sips/bites Postural Changes Seated upright at 90 degrees;Remain semi-upright after after feeds/meals (Comment)   Other Recommendations 06/10/2021 Recommended Consults -- Oral Care Recommendations Oral care BID Other Recommendations -- Follow Up Recommendations No SLP follow up Assistance recommended at discharge None Functional Status Assessment Patient has not had a recent decline in their functional status Frequency and Duration  06/09/2021 Speech Therapy Frequency (ACUTE ONLY) min 2x/week Treatment Duration 2 weeks   Oral Phase 06/10/2021 Oral Phase WFL Oral - Pudding Teaspoon -- Oral - Pudding Cup -- Oral - Honey Teaspoon -- Oral - Honey Cup -- Oral - Nectar Teaspoon -- Oral - Nectar Cup -- Oral - Nectar Straw -- Oral - Thin Teaspoon -- Oral - Thin Cup -- Oral - Thin Straw -- Oral - Puree -- Oral - Mech Soft -- Oral - Regular -- Oral - Multi-Consistency -- Oral - Pill -- Oral Phase - Comment --  Pharyngeal Phase 06/10/2021 Pharyngeal Phase WFL Pharyngeal- Pudding Teaspoon -- Pharyngeal -- Pharyngeal- Pudding Cup -- Pharyngeal -- Pharyngeal- Honey Teaspoon -- Pharyngeal -- Pharyngeal- Honey Cup -- Pharyngeal -- Pharyngeal- Nectar Teaspoon -- Pharyngeal -- Pharyngeal- Nectar Cup -- Pharyngeal -- Pharyngeal- Nectar Straw -- Pharyngeal -- Pharyngeal- Thin Teaspoon -- Pharyngeal -- Pharyngeal- Thin Cup -- Pharyngeal -- Pharyngeal- Thin Straw -- Pharyngeal -- Pharyngeal- Puree -- Pharyngeal -- Pharyngeal- Mechanical Soft -- Pharyngeal -- Pharyngeal- Regular -- Pharyngeal -- Pharyngeal- Multi-consistency -- Pharyngeal -- Pharyngeal- Pill -- Pharyngeal -- Pharyngeal Comment --  Cervical Esophageal Phase  06/10/2021 Cervical Esophageal Phase WFL Pudding Teaspoon --  Pudding Cup -- Honey Teaspoon -- Honey Cup -- Nectar Teaspoon -- Nectar Cup -- Nectar Straw -- Thin Teaspoon --  Thin Cup -- Thin Straw -- Puree -- Mechanical Soft -- Regular -- Multi-consistency -- Pill -- Cervical Esophageal Comment -- Osie Bond., M.A. CCC-SLP Acute Rehabilitation Services Pager [phone] Office [phone] 06/10/2021, 12:54 PM                     US Abdomen Limited RUQ (LIVER/GB)  Result Date: 06/08/2021 CLINICAL DATA:  Abdominal pain EXAM: ULTRASOUND ABDOMEN LIMITED RIGHT UPPER QUADRANT COMPARISON:  CT abdomen dated 06/07/2021. FINDINGS: Gallbladder: Gallbladder wall is thickened, with demonstrated measurement of 5 mm. Incidental note made of 3 mm and 4 mm polyps adherent to the gallbladder wall. 5 mm stone is seen within the gallbladder. Subtle sludge is seen within the gallbladder. No sonographic Murphy's sign elicited. Common bile duct: Diameter: 2 mm Liver: No focal lesion identified. Within normal limits in echogenicity. Portal vein is patent on color Doppler imaging with normal direction of blood flow towards the liver. Other: None. IMPRESSION: 1. Gallbladder wall is thickened, however, no sonographic Murphy's sign was elicited during the exam to confirm an acute cholecystitis. Differential includes cholecystitis (acute and chronic), CHF, hepatic cirrhosis or hepatitis, and hypoalbuminemia. Consider nuclear medicine HIDA scan for further characterization. 2. Sludge and 5 mm stone within the gallbladder. 3. No focal liver lesion is seen by ultrasound. Multiple indeterminate hypodense lesions were seen in the liver on CT abdomen of 06/07/2021. Recommend further characterization with nonemergent liver MRI. Electronically Signed   By: Franki Cabot M.D.   On: 06/08/2021 14:07   (Echo, Carotid, EGD, Colonoscopy, ERCP)    Subjective:  Patient resting in bed daughter by the bedside Discharge Exam: Vitals:   06/27/21 0444 06/27/21 0940  BP: (!) 94/55 (!) 97/49  Pulse: 69 71  Resp: 16 18  Temp: 98.3 F (36.8 C) (!) 97.5 F (36.4 C)  SpO2: 100% 100%   Vitals:   06/26/21 2135 06/27/21  0300 06/27/21 0444 06/27/21 0940  BP: (!) 92/50  (!) 94/55 (!) 97/49  Pulse: 70  69 71  Resp: _0 Temp: 97.9 F (36.6 C)  98.3 F (36.8 C) (!) 97.5 F (36.4 C)  TempSrc: Oral  Oral Oral  SpO2: 100%  100% 100%  Weight:  62.4 kg    Height:        General: Pt is alert, awake, not in acute distress Cardiovascular: RRR, S1/S2 +, no rubs, no gallops Respiratory: CTA bilaterally, no wheezing, no rhonchi Abdominal: Soft, NT, ND, bowel sounds + Extremities: no edema, no cyanosis    The results of significant diagnostics from this hospitalization (including imaging, microbiology, ancillary and laboratory) are listed below for reference.     Microbiology: Recent Results (from the past 240 hour(s))  Resp Panel by RT-PCR (Flu A&B, Covid) Nasopharyngeal Swab     Status: None   Collection Time: 06/26/21  1:46 PM   Specimen: Nasopharyngeal Swab; Nasopharyngeal(NP) swabs in vial transport medium  Result Value Ref Range Status   SARS Coronavirus 2 by RT PCR NEGATIVE NEGATIVE Final    Comment: (NOTE) SARS-CoV-2 target nucleic acids are NOT DETECTED.  The SARS-CoV-2 RNA is generally detectable in upper respiratory specimens during the acute phase of infection. The lowest concentration of SARS-CoV-2 viral copies this assay can detect is 138 copies/mL. A negative result does not preclude SARS-Cov-2 infection and should not be used as the sole basis  for treatment or other patient management decisions. A negative result may occur with  improper specimen collection/handling, submission of specimen other than nasopharyngeal swab, presence of viral mutation(s) within the areas targeted by this assay, and inadequate number of viral copies(<138 copies/mL). A negative result must be combined with clinical observations, patient history, and epidemiological information. The expected result is Negative.  Fact Sheet for Patients:  EntrepreneurPulse.com.au  Fact Sheet for  Healthcare Providers:  IncredibleEmployment.be  This test is no t yet approved or cleared by the [place] FDA and  has been authorized for detection and/or diagnosis of SARS-CoV-2 by FDA under an Emergency Use Authorization (EUA). This EUA will remain  in effect (meaning this test can be used) for the duration of the COVID-19 declaration under Section 564(b)(1) of the Act, 21 U.S.C.section 360bbb-3(b)(1), unless the authorization is terminated  or revoked sooner.       Influenza A by PCR NEGATIVE NEGATIVE Final   Influenza B by PCR NEGATIVE NEGATIVE Final    Comment: (NOTE) The Xpert Xpress SARS-CoV-2/FLU/RSV plus assay is intended as an aid in the diagnosis of influenza from Nasopharyngeal swab specimens and should not be used as a sole basis for treatment. Nasal washings and aspirates are unacceptable for Xpert Xpress SARS-CoV-2/FLU/RSV testing.  Fact Sheet for Patients: EntrepreneurPulse.com.au  Fact Sheet for Healthcare Providers: IncredibleEmployment.be  This test is not yet approved or cleared by the [place] FDA and has been authorized for detection and/or diagnosis of SARS-CoV-2 by FDA under an Emergency Use Authorization (EUA). This EUA will remain in effect (meaning this test can be used) for the duration of the COVID-19 declaration under Section 564(b)(1) of the Act, 21 U.S.C. section 360bbb-3(b)(1), unless the authorization is terminated or revoked.  Performed at [facility], [place] [address], [place], [place] [address]      Labs: BNP (last 3 results) Recent Labs    04/29/21 1251 06/02/21 0958 06/14/21 0041  BNP 1,659.6* 3,498.9* >0,379.5*   Basic Metabolic Panel: Recent Labs  Lab 06/23/21 0801 06/24/21 0122 06/25/21 0244 06/26/21 0334 06/27/21 0542  NA 134* 134* 134* 132* 132*  K 4.0 4.1 3.6 4.0 4.0  CL 93* 93* 94* 93* 94*  CO2 _0 GLUCOSE 103* 120* 113* 117* 109*   BUN 60* 75* 36* 58* 33*  CREATININE 7.56* 9.05* 5.52* 7.36* 5.51*  CALCIUM 8.8* 9.0 8.9 9.2 9.0  MG 2.9* 3.2* 2.7* 3.0* 2.6*  PHOS 5.6* 5.4* 4.1 3.9 3.7   Liver Function Tests: Recent Labs  Lab 06/23/21 0801 06/24/21 0122 06/25/21 0244 06/26/21 0334 06/27/21 0542  AST _1 ALT 51* 45* 37 34 31  ALKPHOS 92 89 86 83 90  BILITOT 1.3* 1.2 1.6* 1.2 1.1  PROT 6.5 6.5 6.5 6.8 6.7  ALBUMIN 3.2* 3.2* 3.4* 3.3* 3.3*   No results for input(s): LIPASE, AMYLASE in the last 168 hours. No results for input(s): AMMONIA in the last 168 hours. CBC: Recent Labs  Lab 06/23/21 0801 06/24/21 0122 06/25/21 0244 06/26/21 0334 06/27/21 0542  WBC 6.0 6.1 5.1 6.0 4.8  NEUTROABS 4.1 4.2 3.3 4.4 3.3  HGB 10.2* 10.0* 9.8* 10.1* 10.4*  HCT 30.8* 31.0* 30.4* 31.7* 31.7*  MCV 98.7 98.7 99.3 98.8 97.8  PLT 147* 143* 133* 144* 148*   Cardiac Enzymes: No results for input(s): CKTOTAL, CKMB, CKMBINDEX, TROPONINI in the last 168 hours. BNP: Invalid input(s): POCBNP CBG: Recent Labs  Lab 06/26/21 2138 06/26/21 2342  06/27/21 0446 06/27/21 0944 06/27/21 1213  GLUCAP 129* 120* 118* 128* 96   D-Dimer No results for input(s): DDIMER in the last 72 hours. Hgb A1c No results for input(s): HGBA1C in the last 72 hours. Lipid Profile No results for input(s): CHOL, HDL, LDLCALC, TRIG, CHOLHDL, LDLDIRECT in the last 72 hours. Thyroid function studies No results for input(s): TSH, T4TOTAL, T3FREE, THYROIDAB in the last 72 hours.  Invalid input(s): FREET3 Anemia work up No results for input(s): VITAMINB12, FOLATE, FERRITIN, TIBC, IRON, RETICCTPCT in the last 72 hours. Urinalysis    Component Value Date/Time   COLORURINE YELLOW 05/14/2021 1842   APPEARANCEUR CLEAR 05/14/2021 1842   LABSPEC >1.030 (H) 05/14/2021 1842   PHURINE 5.5 05/14/2021 1842   GLUCOSEU NEGATIVE 05/14/2021 1842   HGBUR NEGATIVE 05/14/2021 1842   BILIRUBINUR NEGATIVE 05/14/2021 1842   BILIRUBINUR negative  01/23/2020 1028   KETONESUR NEGATIVE 05/14/2021 1842   PROTEINUR NEGATIVE 05/14/2021 1842   UROBILINOGEN 1.0 01/23/2020 1028   NITRITE NEGATIVE 05/14/2021 1842   LEUKOCYTESUR NEGATIVE 05/14/2021 1842   Sepsis Labs Invalid input(s): PROCALCITONIN,  WBC,  LACTICIDVEN Microbiology Recent Results (from the past 240 hour(s))  Resp Panel by RT-PCR (Flu A&B, Covid) Nasopharyngeal Swab     Status: None   Collection Time: 06/26/21  1:46 PM   Specimen: Nasopharyngeal Swab; Nasopharyngeal(NP) swabs in vial transport medium  Result Value Ref Range Status   SARS Coronavirus 2 by RT PCR NEGATIVE NEGATIVE Final    Comment: (NOTE) SARS-CoV-2 target nucleic acids are NOT DETECTED.  The SARS-CoV-2 RNA is generally detectable in upper respiratory specimens during the acute phase of infection. The lowest concentration of SARS-CoV-2 viral copies this assay can detect is 138 copies/mL. A negative result does not preclude SARS-Cov-2 infection and should not be used as the sole basis for treatment or other patient management decisions. A negative result may occur with  improper specimen collection/handling, submission of specimen other than nasopharyngeal swab, presence of viral mutation(s) within the areas targeted by this assay, and inadequate number of viral copies(<138 copies/mL). A negative result must be combined with clinical observations, patient history, and epidemiological information. The expected result is Negative.  Fact Sheet for Patients:  EntrepreneurPulse.com.au  Fact Sheet for Healthcare Providers:  IncredibleEmployment.be  This test is no t yet approved or cleared by the [place] FDA and  has been authorized for detection and/or diagnosis of SARS-CoV-2 by FDA under an Emergency Use Authorization (EUA). This EUA will remain  in effect (meaning this test can be used) for the duration of the COVID-19 declaration under Section 564(b)(1) of the  Act, 21 U.S.C.section 360bbb-3(b)(1), unless the authorization is terminated  or revoked sooner.       Influenza A by PCR NEGATIVE NEGATIVE Final   Influenza B by PCR NEGATIVE NEGATIVE Final    Comment: (NOTE) The Xpert Xpress SARS-CoV-2/FLU/RSV plus assay is intended as an aid in the diagnosis of influenza from Nasopharyngeal swab specimens and should not be used as a sole basis for treatment. Nasal washings and aspirates are unacceptable for Xpert Xpress SARS-CoV-2/FLU/RSV testing.  Fact Sheet for Patients: EntrepreneurPulse.com.au  Fact Sheet for Healthcare Providers: IncredibleEmployment.be  This test is not yet approved or cleared by the [place] FDA and has been authorized for detection and/or diagnosis of SARS-CoV-2 by FDA under an Emergency Use Authorization (EUA). This EUA will remain in effect (meaning this test can be used) for the duration of the COVID-19 declaration under Section 564(b)(1) of the Act, 21  U.S.C. section 360bbb-3(b)(1), unless the authorization is terminated or revoked.  Performed at [facility], [place] [address] [place], [place] [address]      Time coordinating discharge: 38 minutes  SIGNED:   Georgette Shell, MD  [facility] 06/27/2021, 1:09 PM

## 2021-06-27 NOTE — TOC Transition Note (Signed)
Transition of Care [facility]) - CM/SW Discharge Note   Patient Details  Name: Ruben [name] MRN: [id number] Date of Birth: 1945/11/02  [facility] CM/SW Contact:  Emeterio Reeve, LCSW Phone Number: 06/27/2021, 1:10 PM   Clinical Narrative:     Per MD patient ready for DC to [facility]. RN, patient, patient's family, and facility notified of DC. Discharge Summary and FL2 sent to facility. DC packet on chart. Insurance Josem Kaufmann has been received and pt is covid negative. Ambulance transport requested for patient.    RN to call report to [phone].  CSW will sign off for now as social work intervention is no longer needed. Please consult [place] again if new needs arise.   Final next level of care: Skilled Nursing Facility Barriers to Discharge: Barriers Resolved   Patient Goals and CMS Choice     Choice offered to / list presented to : Patient  Discharge Placement              Patient chooses bed at: Other - please specify in the comment section below: ([facility]) Patient to be transferred to facility by: ptar Name of family member notified: daughter, in wife aware Patient and family notified of of transfer: 06/27/21  Discharge Plan and Services     Post Acute Care Choice: Home Health          DME Arranged: Oxygen, Other see comment (transport chair)   Date DME Agency Contacted: 06/24/21 Time DME Agency Contacted: 1300 Representative spoke with at DME Agency: Freda Munro HH Arranged: RN, PT, OT, Social Work [facility] Agency: [facility] Date [place]: 06/20/21   Representative spoke with at [place]: [place] ([place]) Interventions     Readmission Risk Interventions No flowsheet data found.   Emeterio Reeve, LCSW Clinical Social Worker

## 2021-06-27 NOTE — Progress Notes (Signed)
Report called to [place], Therapist, sports at [employer]. Pt discharged in stable condition via [facility].

## 2021-06-27 NOTE — Progress Notes (Signed)
[facility] Progress Note   Subjective:  Seen in room - sitting in recliner. No c/o today, no CP or dyspnea.  Objective Vitals:   06/26/21 2135 06/27/21 0300 06/27/21 0444 06/27/21 0940  BP: (!) 92/50  (!) 94/55 (!) 97/49  Pulse: 70  69 71  Resp: 16  16 18   Temp: 97.9 F (36.6 C)  98.3 F (36.8 C) (!) 97.5 F (36.4 C)  TempSrc: Oral  Oral Oral  SpO2: 100%  100% 100%  Weight:  62.4 kg    Height:       Physical Exam General: Elderly man, NAD. Room air. Heart: RRR; no murmur Lungs: CTA anteriorly, no rales Abdomen: soft, non-tender Extremities: No LE edema Dialysis Access: TDC, LUE AVF + thrill  Additional Objective Labs: Basic Metabolic Panel: Recent Labs  Lab 06/25/21 0244 06/26/21 0334 06/27/21 0542  NA 134* 132* 132*  K 3.6 4.0 4.0  CL 94* 93* 94*  CO2 29 25 27   GLUCOSE 113* 117* 109*  BUN 36* 58* 33*  CREATININE 5.52* 7.36* 5.51*  CALCIUM 8.9 9.2 9.0  PHOS 4.1 3.9 3.7   Liver Function Tests: Recent Labs  Lab 06/25/21 0244 06/26/21 0334 06/27/21 0542  AST 27 31 31   ALT 37 34 31  ALKPHOS 86 83 90  BILITOT 1.6* 1.2 1.1  PROT 6.5 6.8 6.7  ALBUMIN 3.4* 3.3* 3.3*   CBC: Recent Labs  Lab 06/23/21 0801 06/24/21 0122 06/25/21 0244 06/26/21 0334 06/27/21 0542  WBC 6.0 6.1 5.1 6.0 4.8  NEUTROABS 4.1 4.2 3.3 4.4 3.3  HGB 10.2* 10.0* 9.8* 10.1* 10.4*  HCT 30.8* 31.0* 30.4* 31.7* 31.7*  MCV 98.7 98.7 99.3 98.8 97.8  PLT 147* 143* 133* 144* 148*   Studies/Results: DG Abd 1 View  Result Date: 06/25/2021 CLINICAL DATA:  Constipation EXAM: ABDOMEN - 1 VIEW COMPARISON:  Previous studies including the examination of 06/09/2021 FINDINGS: There is moderate gaseous distention of stomach. There are few slightly dilated small bowel loops. Gas and stool are noted in colon. Tip of large caliber central venous catheter is seen in the region of right atrium. There is blunting of left lateral CP angle. IMPRESSION: There is moderate gaseous distention  of stomach and mild dilation of small-bowel loops suggesting ileus. Electronically Signed   By: Elmer Picker M.D.   On: 06/25/2021 16:15    Medications:  promethazine (PHENERGAN) injection (IM or IVPB) Stopped (06/13/21 2334)    (feeding supplement) PROSource Plus  30 mL Oral TID BM   apixaban  5 mg Oral BID   atorvastatin  80 mg Oral Daily   bisacodyl  10 mg Rectal Daily   Chlorhexidine Gluconate Cloth  6 each Topical Q0600   darbepoetin (ARANESP) injection - DIALYSIS  60 mcg Intravenous Q Mon-HD   diltiazem   Topical BID   ferric citrate  420 mg Oral TID WC   gabapentin  100 mg Oral BID   hydrocortisone  25 mg Rectal BID   hydrOXYzine  10 mg Oral Q M,W,F-1800   magnesium oxide  200 mg Oral Daily   midodrine  10 mg Oral TID WC   midodrine  10 mg Oral Q M,W,F-HD   mirtazapine  3.75 mg Oral QHS   multivitamin  1 tablet Oral QHS   polyethylene glycol  17 g Oral BID   senna-docusate  2 tablet Oral BID   sodium chloride flush  3 mL Intravenous Q12H    Dialysis Orders: MWF at [facility] 4hr, 400/500, EDW  72kg (although leaving 74kg range), 2K/2Ca, TDC (+ maturing AVF), heparin 2000 unit bolus - No ESA or VDRA   Assessment/Plan: Proctitis/ rectal ulceration/ fecal impaction: On aggressive bowel regimen.  Disimpacted by GI w/large amount of stool removed. Having good BM but continued rectal pain.  GI following - Flex Sig on 2/10 rectal ulceration/proctitis, biopsies showed "focal erosion." Liver/kidney lesions: Elevated LFTs. MRI showed benign liver cysts stable since 2019, no suspicious liver lesions, 2 small indeterminate T2 hypointense renal cortical lesions.  Dysphagia: Swallow studies normal.  ESRD:  Continue HD on MWF schedule. CXR 2/17 with pulmonary edema, s/p extra HD 2/18. Back to usual  MWF schedule - HD tomorrow. Hypotension/volume: Chronically low BP, on midodrine 10mg  TID. Continue UF as BP allows. Well below prior EDW, will need to be lowered on d/c . Anemia: Hgb 10.4 -  FOBT negative. Continue Aranesp 93mcg q Monday.  Metabolic bone disease: CorrCa/Phos both initially high, now improving. Lorin Picket was increased to 2/meals. CAD/HFrEF - missed outpatient cardiology follow up.  Will need to reschedule after d/c unless need to be seen as inpatient. A-fib: On amiodarone/Eliquis.  Nutrition - On regular diet. RD concerned for poor intake. Prot supp for low albumin.  Follow labs.  Depression/anxiety - psych consulted, started mirtazapine/hydroxyzine.    Ruben Penton, PA-C 06/27/2021, 10:39 AM  [facility]

## 2021-06-27 NOTE — Care Management Important Message (Signed)
Important Message  Patient Details  Name: Ruben [name] MRN: [id number] Date of Birth: 1945/10/07   Medicare Important Message Given:  Yes     Hannah Beat 06/27/2021, 1:34 PM

## 2021-06-27 NOTE — Social Work (Signed)
Pts Passr number  is [phone].  Emeterio Reeve, LCSW Clinical Social Worker

## 2021-06-27 NOTE — Progress Notes (Signed)
Pt to d/c to [facility] today. Contacted [place] SW and spoke to [employer], Therapist, sports. Clinic aware pt to d/c to snf today and will resume care tomorrow.   Melven Sartorius Renal Navigator [phone]

## 2021-06-28 DIAGNOSIS — E43 Unspecified severe protein-calorie malnutrition: Secondary | ICD-10-CM | POA: Diagnosis not present

## 2021-06-28 DIAGNOSIS — Z992 Dependence on renal dialysis: Secondary | ICD-10-CM | POA: Diagnosis not present

## 2021-06-28 DIAGNOSIS — I5021 Acute systolic (congestive) heart failure: Secondary | ICD-10-CM | POA: Diagnosis not present

## 2021-06-28 DIAGNOSIS — D689 Coagulation defect, unspecified: Secondary | ICD-10-CM | POA: Diagnosis not present

## 2021-06-28 DIAGNOSIS — N2581 Secondary hyperparathyroidism of renal origin: Secondary | ICD-10-CM | POA: Diagnosis not present

## 2021-06-28 DIAGNOSIS — N179 Acute kidney failure, unspecified: Secondary | ICD-10-CM | POA: Diagnosis not present

## 2021-07-01 ENCOUNTER — Encounter: Payer: Self-pay | Admitting: Nurse Practitioner

## 2021-07-01 DIAGNOSIS — N179 Acute kidney failure, unspecified: Secondary | ICD-10-CM | POA: Diagnosis not present

## 2021-07-01 DIAGNOSIS — D689 Coagulation defect, unspecified: Secondary | ICD-10-CM | POA: Diagnosis not present

## 2021-07-01 DIAGNOSIS — Z992 Dependence on renal dialysis: Secondary | ICD-10-CM | POA: Diagnosis not present

## 2021-07-01 DIAGNOSIS — N2581 Secondary hyperparathyroidism of renal origin: Secondary | ICD-10-CM | POA: Diagnosis not present

## 2021-07-01 DIAGNOSIS — I5021 Acute systolic (congestive) heart failure: Secondary | ICD-10-CM | POA: Diagnosis not present

## 2021-07-01 DIAGNOSIS — E43 Unspecified severe protein-calorie malnutrition: Secondary | ICD-10-CM | POA: Diagnosis not present

## 2021-07-02 DIAGNOSIS — I120 Hypertensive chronic kidney disease with stage 5 chronic kidney disease or end stage renal disease: Secondary | ICD-10-CM | POA: Diagnosis not present

## 2021-07-02 DIAGNOSIS — E785 Hyperlipidemia, unspecified: Secondary | ICD-10-CM | POA: Diagnosis not present

## 2021-07-02 DIAGNOSIS — I5040 Unspecified combined systolic (congestive) and diastolic (congestive) heart failure: Secondary | ICD-10-CM | POA: Diagnosis not present

## 2021-07-02 DIAGNOSIS — I2510 Atherosclerotic heart disease of native coronary artery without angina pectoris: Secondary | ICD-10-CM | POA: Diagnosis not present

## 2021-07-03 ENCOUNTER — Other Ambulatory Visit: Payer: Self-pay | Admitting: *Deleted

## 2021-07-03 DIAGNOSIS — Z992 Dependence on renal dialysis: Secondary | ICD-10-CM | POA: Diagnosis not present

## 2021-07-03 DIAGNOSIS — N2581 Secondary hyperparathyroidism of renal origin: Secondary | ICD-10-CM | POA: Diagnosis not present

## 2021-07-03 DIAGNOSIS — E559 Vitamin D deficiency, unspecified: Secondary | ICD-10-CM | POA: Diagnosis not present

## 2021-07-03 DIAGNOSIS — N179 Acute kidney failure, unspecified: Secondary | ICD-10-CM | POA: Diagnosis not present

## 2021-07-03 DIAGNOSIS — D509 Iron deficiency anemia, unspecified: Secondary | ICD-10-CM | POA: Diagnosis not present

## 2021-07-03 DIAGNOSIS — D689 Coagulation defect, unspecified: Secondary | ICD-10-CM | POA: Diagnosis not present

## 2021-07-03 DIAGNOSIS — I10 Essential (primary) hypertension: Secondary | ICD-10-CM | POA: Diagnosis not present

## 2021-07-03 DIAGNOSIS — E119 Type 2 diabetes mellitus without complications: Secondary | ICD-10-CM | POA: Diagnosis not present

## 2021-07-03 DIAGNOSIS — D519 Vitamin B12 deficiency anemia, unspecified: Secondary | ICD-10-CM | POA: Diagnosis not present

## 2021-07-03 DIAGNOSIS — D529 Folate deficiency anemia, unspecified: Secondary | ICD-10-CM | POA: Diagnosis not present

## 2021-07-03 NOTE — Patient Outreach (Addendum)
Per [place] eligible member resides in  [facility].  Screened for potential [facility] care coordination services as a benefit of [employer] plan. ? ?Member's PCP at [facility] has [place] care coordination team available if needed. CCM services would need to be ordered by PCP. ? ?Mr. Studer admitted to SNF on 06/27/21 after hospitalization. ? ?Facility site visit to [facility] skilled nursing facility. Met with Marita [place], SNF SW who reports care plan meeting is scheduled for 3/2. However, member's insurance has issued discharge for 3/2. Family has appealed discharge. ? ?Went to speak with Mr. Swoyer at bedside at [facility] to discuss transition plans and care coordination needs. However, he was not in room. Out of facility for dialysis.  ? ?Will plan outreach to Mr. Giambalvo to discuss [facility] follow up post SNF.  ? ?Will continue to follow while member resides in SNF.  ? ? ?[name], MSN, RN,BSN ?[place] Coordinator ?[phone] [facility]) ?[phone]  (Toll free office)  ? ? ? ? ?  ?

## 2021-07-04 ENCOUNTER — Other Ambulatory Visit: Payer: Self-pay | Admitting: *Deleted

## 2021-07-04 DIAGNOSIS — I10 Essential (primary) hypertension: Secondary | ICD-10-CM | POA: Diagnosis not present

## 2021-07-04 NOTE — Patient Outreach (Signed)
[place] Coordinator follow up. [facility] eligible member screened for potential [facility] care coordination services as a benefit of member's insurance plan. ? ?Member's PCP at [facility] has [place] care coordination services available to member if needed post SNF. CCM services will have to be ordered by PCP. ? ?Mr. Needs resides in [facility].  ? ?Telephone call made to Ruben [name] [phone] to discuss [facility] care coordination services. No answer. HIPAA compliant voicemail message left to request return call.   ? ?Will plan outreach again at later time. Will continue to follow while Ruben [name] remains in SNF.  ? ? ?[name], MSN, RN,BSN ?[place] Coordinator ?[phone] [facility]) ?[phone]  (Toll free office)   ?

## 2021-07-05 ENCOUNTER — Other Ambulatory Visit: Payer: Self-pay | Admitting: *Deleted

## 2021-07-05 DIAGNOSIS — Z992 Dependence on renal dialysis: Secondary | ICD-10-CM | POA: Diagnosis not present

## 2021-07-05 DIAGNOSIS — D689 Coagulation defect, unspecified: Secondary | ICD-10-CM | POA: Diagnosis not present

## 2021-07-05 DIAGNOSIS — D509 Iron deficiency anemia, unspecified: Secondary | ICD-10-CM | POA: Diagnosis not present

## 2021-07-05 DIAGNOSIS — I5021 Acute systolic (congestive) heart failure: Secondary | ICD-10-CM | POA: Diagnosis not present

## 2021-07-05 DIAGNOSIS — M6281 Muscle weakness (generalized): Secondary | ICD-10-CM | POA: Diagnosis not present

## 2021-07-05 DIAGNOSIS — D464 Refractory anemia, unspecified: Secondary | ICD-10-CM | POA: Diagnosis not present

## 2021-07-05 DIAGNOSIS — I214 Non-ST elevation (NSTEMI) myocardial infarction: Secondary | ICD-10-CM | POA: Diagnosis not present

## 2021-07-05 DIAGNOSIS — D631 Anemia in chronic kidney disease: Secondary | ICD-10-CM | POA: Diagnosis not present

## 2021-07-05 DIAGNOSIS — N186 End stage renal disease: Secondary | ICD-10-CM | POA: Diagnosis not present

## 2021-07-05 DIAGNOSIS — N2581 Secondary hyperparathyroidism of renal origin: Secondary | ICD-10-CM | POA: Diagnosis not present

## 2021-07-05 NOTE — Patient Outreach (Signed)
Telephone call received from Ruben [name] (daughter/DPR) [phone]. Ruben [name] states Ruben [name] is actively being discharged from [facility] to home. States Ruben [name] has poor cell phone reception in SNF room. ? ?Discussed [facility] follow up from [place] care coordination team with [facility]. Ruben [name] states they are transferring practices. States appointment at [place] [facility] is on March 9th. [facility] has [facility] care management services available.  ? ?Will make referral to [facility] after Ruben [name] appointment on March 9th with [facility].  ? ? ?Ruben Rolling, MSN, RN,BSN ?[place] Coordinator ?[phone] [facility]) ?[phone]  (Toll free office)  ? ?

## 2021-07-05 NOTE — Patient Outreach (Signed)
[place] Coordinator follow up. Per [place] Mr. Mittleman resides in [facility]. [facility] eligible member screened for potential [facility] care coordination services as a benefit of member's insurance plan. ? ?Member's PCP at [facility] has [place] care coordination services available to member if needed post SNF. ? ?Telephone call made to Mr. Lopes [phone] to discuss [facility] follow up. However, Mr. Burandt was unable to hear writer over the phone. Phone became disconnected.  ? ?Will plan follow up again with Mr. Plotkin. Will plan to make referral for CCM services with PCP office upon SNF discharge. ? ?Will continue to follow while member resides in SNF.  ? ? ? ?[name], MSN, RN,BSN ?[place] Coordinator ?[phone] [facility]) ?[phone]  (Toll free office)   ?

## 2021-07-08 ENCOUNTER — Telehealth: Payer: Self-pay

## 2021-07-08 DIAGNOSIS — N2581 Secondary hyperparathyroidism of renal origin: Secondary | ICD-10-CM | POA: Diagnosis not present

## 2021-07-08 DIAGNOSIS — D689 Coagulation defect, unspecified: Secondary | ICD-10-CM | POA: Diagnosis not present

## 2021-07-08 DIAGNOSIS — N186 End stage renal disease: Secondary | ICD-10-CM | POA: Diagnosis not present

## 2021-07-08 DIAGNOSIS — D631 Anemia in chronic kidney disease: Secondary | ICD-10-CM | POA: Diagnosis not present

## 2021-07-08 DIAGNOSIS — Z992 Dependence on renal dialysis: Secondary | ICD-10-CM | POA: Diagnosis not present

## 2021-07-08 DIAGNOSIS — D509 Iron deficiency anemia, unspecified: Secondary | ICD-10-CM | POA: Diagnosis not present

## 2021-07-08 NOTE — Telephone Encounter (Signed)
Error

## 2021-07-08 NOTE — Telephone Encounter (Signed)
Transition Care Management Unsuccessful Follow-up Telephone Call ? ?Date of discharge and from where:  07/08/[facility]21 Nursing Home  ? ?Attempts:  1st Attempt ? ?Reason for unsuccessful TCM follow-up call:  Left voice message ? ?  ?

## 2021-07-09 ENCOUNTER — Other Ambulatory Visit: Payer: Self-pay | Admitting: *Deleted

## 2021-07-09 ENCOUNTER — Other Ambulatory Visit: Payer: Self-pay

## 2021-07-09 ENCOUNTER — Telehealth (INDEPENDENT_AMBULATORY_CARE_PROVIDER_SITE_OTHER): Payer: Medicare Other | Admitting: Nurse Practitioner

## 2021-07-09 ENCOUNTER — Telehealth: Payer: Self-pay

## 2021-07-09 ENCOUNTER — Encounter: Payer: Self-pay | Admitting: Nurse Practitioner

## 2021-07-09 DIAGNOSIS — E43 Unspecified severe protein-calorie malnutrition: Secondary | ICD-10-CM | POA: Diagnosis not present

## 2021-07-09 DIAGNOSIS — K5900 Constipation, unspecified: Secondary | ICD-10-CM | POA: Diagnosis not present

## 2021-07-09 DIAGNOSIS — L299 Pruritus, unspecified: Secondary | ICD-10-CM | POA: Diagnosis not present

## 2021-07-09 DIAGNOSIS — M533 Sacrococcygeal disorders, not elsewhere classified: Secondary | ICD-10-CM | POA: Diagnosis not present

## 2021-07-09 DIAGNOSIS — I480 Paroxysmal atrial fibrillation: Secondary | ICD-10-CM | POA: Diagnosis not present

## 2021-07-09 DIAGNOSIS — I252 Old myocardial infarction: Secondary | ICD-10-CM | POA: Diagnosis not present

## 2021-07-09 DIAGNOSIS — Z681 Body mass index (BMI) 19 or less, adult: Secondary | ICD-10-CM | POA: Diagnosis not present

## 2021-07-09 DIAGNOSIS — I5030 Unspecified diastolic (congestive) heart failure: Secondary | ICD-10-CM | POA: Diagnosis not present

## 2021-07-09 DIAGNOSIS — E785 Hyperlipidemia, unspecified: Secondary | ICD-10-CM | POA: Diagnosis not present

## 2021-07-09 DIAGNOSIS — R634 Abnormal weight loss: Secondary | ICD-10-CM | POA: Diagnosis not present

## 2021-07-09 DIAGNOSIS — I5022 Chronic systolic (congestive) heart failure: Secondary | ICD-10-CM | POA: Diagnosis not present

## 2021-07-09 DIAGNOSIS — K769 Liver disease, unspecified: Secondary | ICD-10-CM | POA: Diagnosis not present

## 2021-07-09 DIAGNOSIS — I5021 Acute systolic (congestive) heart failure: Secondary | ICD-10-CM | POA: Diagnosis not present

## 2021-07-09 DIAGNOSIS — Z720 Tobacco use: Secondary | ICD-10-CM | POA: Diagnosis not present

## 2021-07-09 DIAGNOSIS — Z7901 Long term (current) use of anticoagulants: Secondary | ICD-10-CM | POA: Diagnosis not present

## 2021-07-09 DIAGNOSIS — I214 Non-ST elevation (NSTEMI) myocardial infarction: Secondary | ICD-10-CM | POA: Diagnosis not present

## 2021-07-09 DIAGNOSIS — K5641 Fecal impaction: Secondary | ICD-10-CM | POA: Diagnosis not present

## 2021-07-09 DIAGNOSIS — M1990 Unspecified osteoarthritis, unspecified site: Secondary | ICD-10-CM | POA: Diagnosis not present

## 2021-07-09 DIAGNOSIS — D631 Anemia in chronic kidney disease: Secondary | ICD-10-CM | POA: Diagnosis not present

## 2021-07-09 DIAGNOSIS — L89152 Pressure ulcer of sacral region, stage 2: Secondary | ICD-10-CM | POA: Diagnosis not present

## 2021-07-09 DIAGNOSIS — J9611 Chronic respiratory failure with hypoxia: Secondary | ICD-10-CM | POA: Diagnosis not present

## 2021-07-09 DIAGNOSIS — R1310 Dysphagia, unspecified: Secondary | ICD-10-CM | POA: Diagnosis not present

## 2021-07-09 DIAGNOSIS — I132 Hypertensive heart and chronic kidney disease with heart failure and with stage 5 chronic kidney disease, or end stage renal disease: Secondary | ICD-10-CM | POA: Diagnosis not present

## 2021-07-09 DIAGNOSIS — N186 End stage renal disease: Secondary | ICD-10-CM | POA: Diagnosis not present

## 2021-07-09 DIAGNOSIS — I2510 Atherosclerotic heart disease of native coronary artery without angina pectoris: Secondary | ICD-10-CM | POA: Diagnosis not present

## 2021-07-09 DIAGNOSIS — F32A Depression, unspecified: Secondary | ICD-10-CM | POA: Diagnosis not present

## 2021-07-09 MED ORDER — HYDROXYZINE HCL 10 MG PO TABS
10.0000 mg | ORAL_TABLET | ORAL | 5 refills | Status: DC
Start: 2021-07-10 — End: 2021-09-17

## 2021-07-09 NOTE — Telephone Encounter (Signed)
Transition Care Management Unsuccessful Follow-up Telephone Call ? ?Date of discharge and from where:  06/27/2021 [facility] ? ?Attempts:  2nd Attempt ? ?Reason for unsuccessful TCM follow-up call:  Left voice message ? ?  ?

## 2021-07-09 NOTE — Telephone Encounter (Signed)
Transition Care Management Follow-up Telephone Call ?Date of discharge and from where: 06/27/2021 [place]  ?How have you been since you were released from the hospital? Pt daughter states it has been challenging, still no appetite. She reports a sore on rectum.  ?Any questions or concerns? No ? ?Items Reviewed: ?Did the pt receive and understand the discharge instructions provided? Yes  ?Medications obtained and verified? Yes  ?Other? Yes  ?Any new allergies since your discharge? No  ?Dietary orders reviewed? Yes ?Do you have support at home? Yes  ? ?Home Care and Equipment/Supplies: ?Were home health services ordered? yes ?If so, what is the name of the agency? N/a  ?Has the agency set up a time to come to the patient's home? yes ?Were any new equipment or medical supplies ordered?  Yes: wheelchair, walker.  ?What is the name of the medical supply agency? Daughter does not remember.  ?Were you able to get the supplies/equipment? yes ?Do you have any questions related to the use of the equipment or supplies? No ? ?Functional Questionnaire: (I = Independent and D = Dependent) ?ADLs: d ? ?Bathing/Dressing- d ? ?Meal Prep- d ? ?Eating- d ? ?Maintaining continence- d ? ?Transferring/Ambulation- d ? ?Managing Meds- d ? ?Follow up appointments reviewed: ? ?PCP Hospital f/u appt confirmed? No  Scheduled to see n/a on n/a @ n/a. ?[facility] f/u appt confirmed? No  Scheduled to see n/a on n.a @ n/a. ?Are transportation arrangements needed? No  ?If their condition worsens, is the pt aware to call PCP or go to the Emergency Dept.? Yes ?Was the patient provided with contact information for the PCP's office or ED? Yes ?Was to pt encouraged to call back with questions or concerns? Yes  ?

## 2021-07-09 NOTE — Patient Outreach (Signed)
[place] Coordinator follow up. [facility] eligible member screened for potential [facility] Care Management services as a benefit of member's insurance plan. ? ?Member's PCP at [facility] has [place] care coordination services available to member if needed post SNF. ? ?Mr. Gluth transitioned to home from [facility] on 07/05/21. Telephone call made to Reba/DPR daughter [phone]. Reba confirms Mr. Stfleur has home health with [facility]. States she paid 400.00 for Eliquis even with coupon. Encouraged Reba to contact number on Eliquis card to confirm eligibility and to follow back up with pharmacist to request refund as Eliquis should have been free with the 30 day free pharmacy card. Reba also stated member's Hydroxyzine was not in his prescriptions at the pharmacy. Advised Reba to contact PCP.  ? ?Discussed that writer will call back in a few days to follow up and to see if she wanted referral for CCM services. ? ?Provided Reba with writer's contact information.  ? ? ?[name], MSN, RN,BSN ?[place] Coordinator ?[phone] [facility]) ?[phone]  (Toll free office)   ?

## 2021-07-09 NOTE — Patient Instructions (Signed)

## 2021-07-09 NOTE — Progress Notes (Signed)
Virtual Visit via MyChart   This visit type was conducted due to national recommendations for restrictions regarding the COVID-19 Pandemic (e.g. social distancing) in an effort to limit this patient's exposure and mitigate transmission in our community.  Due to his co-morbid illnesses, this patient is at least at moderate risk for complications without adequate follow up.  This format is felt to be most appropriate for this patient at this time.  All issues noted in this document were discussed and addressed.  A limited physical exam was performed with this format.    This visit type was conducted due to national recommendations for restrictions regarding the COVID-19 Pandemic (e.g. social distancing) in an effort to limit this patient's exposure and mitigate transmission in our community.  Patients identity confirmed using two different identifiers.  This format is felt to be most appropriate for this patient at this time.  All issues noted in this document were discussed and addressed.  No physical exam was performed (except for noted visual exam findings with Video Visits).    Date:  07/18/2021   ID:  Ruben [name], DOB [birth year], MRN [id number]  Patient Location:  Home - spoke with William Hamburger and Liana Crocker  Provider location:   Office    Chief Complaint:  hospital f/u  History of Present Illness:    Ruben [name] is a 76 y.o. male who presents via video conferencing for a telehealth visit today.    The patient does not have symptoms concerning for COVID-19 infection (fever, chills, cough, or new shortness of breath).   Patient presents today for hospital follow up from admission from 2/3-2/23, with complaints of rectal pain and difficulty swallowing for 2 weeks. Initially there was concern about possible cholecystitis on admission and had a surgical consult. His initial imaging showed nonspecific liver lesions. When he was to go hom on 2/17, he became more  dyspneic and had HD x 2 on 2/17 and 2/18.   He has been discharged from [facility].  He has been taking miralax 3 times a day with a goal to have 2 bowel movements a day, continues to have a sore and tender buttocks, worse at the crease of his buttocks. He did have a nurse to visit today it was today by [facility].  Today is the first day he has had loose stools prior was 4 firm stools.    [facility]  is seeing him for Home health - will order egg crate or something to help support his buttocks/sacral area that is causing discomfort.   His daughter is present in the room during visit    Past Medical History:  Diagnosis Date   Arthritis    Chronic kidney disease, stage 3b (HCC)    ESRD (end stage renal disease) (HCC)    ETOH abuse    History of stress test 09/02/2008   showed inferolateral scar without ischemia   Hx of echocardiogram 09/02/2008   was essentially normal   Hyperlipidemia    Hypertension    Myocardial infarction [facility])    Tobacco abuse    Past Surgical History:  Procedure Laterality Date   AV FISTULA PLACEMENT Left 05/27/2021   Procedure: LEFT ARTERIOVENOUS (AV) GRAFT CREATION;  Surgeon: Victorino Sparrow, MD;  [facility] OR;  Service: Vascular;  Laterality: Left;  PERIPHERAL NERVE BLOCK   BACK SURGERY     Dr Phoebe Perch involving L3-L4 discectomy.   BIOPSY  06/14/2021   Procedure: BIOPSY;  Surgeon: Jeani Hawking, MD;  Location: [facility] ENDOSCOPY;  Service: Endoscopy;;   CARDIOVERSION N/A 05/13/2021   Procedure: CARDIOVERSION;  Surgeon: Laurey Morale, MD;  Location[facility] ENDOSCOPY;  Service: Cardiovascular;  Laterality: N/A;   FLEXIBLE SIGMOIDOSCOPY N/A 06/14/2021   Procedure: Arnell Sieving;  Surgeon: Jeani Hawking, MD;  Location[facility] ENDOSCOPY;  Service: Endoscopy;  Laterality: N/A;   HERNIA REPAIR     IR FLUORO GUIDE CV LINE RIGHT  05/21/2021   IR US GUIDE VASC ACCESS RIGHT  05/21/2021   RIGHT/LEFT HEART CATH AND CORONARY ANGIOGRAPHY N/A 05/09/2021   Procedure: RIGHT/LEFT  HEART CATH AND CORONARY ANGIOGRAPHY;  Surgeon: Laurey Morale, MD;  Location: [facility] INVASIVE CV LAB;  Service: Cardiovascular;  Laterality: N/A;   SPINE SURGERY     TEE WITHOUT CARDIOVERSION N/A 05/07/2021   Procedure: TRANSESOPHAGEAL ECHOCARDIOGRAM (TEE);  Surgeon: Laurey Morale, MD;  Location: [facility] ENDOSCOPY;  Service: Cardiovascular;  Laterality: N/A;   TEE WITHOUT CARDIOVERSION N/A 05/13/2021   Procedure: TRANSESOPHAGEAL ECHOCARDIOGRAM (TEE);  Surgeon: Laurey Morale, MD;  Location: [facility] ENDOSCOPY;  Service: Cardiovascular;  Laterality: N/A;     Current Meds  Medication Sig   acetaminophen (TYLENOL) 650 MG CR tablet Take 650 mg by mouth every 8 (eight) hours as needed for pain.   albuterol (PROVENTIL) (2.5 MG/3ML) 0.083% nebulizer solution Take 3 mLs (2.5 mg total) by nebulization every 4 (four) hours as needed for wheezing or shortness of breath.   amiodarone (PACERONE) 200 MG tablet Take 1 tablet (200 mg total) by mouth daily.   apixaban (ELIQUIS) 5 MG TABS tablet Take 1 tablet (5 mg total) by mouth 2 (two) times daily.   atorvastatin (LIPITOR) 80 MG tablet Take 1 tablet (80 mg total) by mouth daily.   cholecalciferol (VITAMIN D3) 25 MCG (1000 UNIT) tablet Take 1,000 Units by mouth daily.   diltiazem 2 % GEL Apply 1 application topically 2 (two) times daily. Apply to anal fissures   hydrocortisone (ANUSOL-HC) 25 MG suppository Place 1 suppository (25 mg total) rectally 2 (two) times daily.   Lidocaine-Hydrocortisone Ace 3-2.5 % KIT Place 1 application rectally 2 (two) times daily.   Magnesium 200 MG TABS Take 1 tablet (200 mg total) by mouth daily. With evening meal (Patient taking differently: Take 400 mg by mouth daily.)   midodrine (PROAMATINE) 10 MG tablet Take 1 tablet (10 mg total) by mouth 3 (three) times daily with meals.   mirtazapine (REMERON) 7.5 MG tablet Take 1 tablet (7.5 mg total) by mouth at bedtime.   multivitamin (RENA-VIT) TABS tablet Take 1 tablet by mouth at bedtime.    nitroGLYCERIN (NITROSTAT) 0.4 MG SL tablet Place 1 tablet (0.4 mg total) under the tongue every 5 (five) minutes as needed for chest pain.   ondansetron (ZOFRAN) 4 MG tablet Take 1 tablet (4 mg total) by mouth daily as needed for nausea or vomiting.   oxyCODONE (OXY IR/ROXICODONE) 5 MG immediate release tablet Take 1 tablet (5 mg total) by mouth every 6 (six) hours as needed for moderate pain.   phenylephrine (,USE FOR PREPARATION-H,) 0.25 % suppository Place 1 suppository rectally 2 (two) times daily.   polyethylene glycol (MIRALAX / GLYCOLAX) 17 g packet Take 17 g by mouth 2 (two) times daily.   senna-docusate (SENOKOT-S) 8.6-50 MG tablet Take 2 tablets by mouth 2 (two) times daily.   [DISCONTINUED] hydrOXYzine (ATARAX) 10 MG tablet Take 1 tablet (10 mg total) by mouth every Monday, Wednesday, and Friday at 6 PM.     Allergies:  Other   Social History   Tobacco Use   Smoking status: Former    Packs/day: 1.50    Years: 20.00    Pack years: 30.00    Types: Cigarettes   Smokeless tobacco: Never   Tobacco comments:    down to 2 packs per week; 3/21 - down to 1 Pack per week  Vaping Use   Vaping Use: Never used  Substance Use Topics   Alcohol use: No    Alcohol/week: 0.0 standard drinks   Drug use: No     Family Hx: The patient's family history includes Cancer in his father; Diabetes in his brother and brother; Heart attack in his brother; Heart disease in his brother and brother.  ROS:   Please see the history of present illness.    Review of Systems  Constitutional: Negative.   Respiratory: Negative.    Cardiovascular: Negative.   Gastrointestinal: Negative.   Musculoskeletal:        Sacral areas is tender   Neurological: Negative.   Psychiatric/Behavioral: Negative.     All other systems reviewed and are negative.   Labs/Other Tests and Data Reviewed:    Recent Labs: 06/08/2021: TSH 2.476 06/14/2021: B Natriuretic Peptide >4,500.0 06/27/2021: Magnesium 2.6 07/12/2021:  ALT 29; BUN 6; Creatinine, Ser 3.29; Hemoglobin 10.8; Platelets 155; Potassium 3.6; Sodium 138   Recent Lipid Panel Lab Results  Component Value Date/Time   CHOL 160 04/30/2021 02:27 AM   CHOL 202 (H) 02/18/2021 11:48 AM   TRIG 41 04/30/2021 02:27 AM   HDL 59 04/30/2021 02:27 AM   HDL 55 02/18/2021 11:48 AM   CHOLHDL 2.7 04/30/2021 02:27 AM   LDLCALC 93 04/30/2021 02:27 AM   LDLCALC 125 (H) 02/18/2021 11:48 AM    Wt Readings from Last 3 Encounters:  07/18/21 137 lb 6.4 oz (62.3 kg)  07/12/21 139 lb (63 kg)  06/27/21 137 lb 9.1 oz (62.4 kg)     Exam:    Vital Signs:  There were no vitals taken for this visit.    Physical Exam Constitutional:      General: He is not in acute distress.    Appearance: Normal appearance.  Pulmonary:     Effort: Pulmonary effort is normal. No respiratory distress.     Breath sounds: No wheezing.  Skin:    Comments: I do not see an open area but his skin is thin to the sacral area   Neurological:     General: No focal deficit present.     Mental Status: He is alert and oriented to person, place, and time. Mental status is at baseline.     Cranial Nerves: No cranial nerve deficit.  Psychiatric:        Mood and Affect: Mood and affect normal.        Behavior: Behavior normal.        Thought Content: Thought content normal.        Cognition and Memory: Memory normal.        Judgment: Judgment normal.    ASSESSMENT & PLAN:     1. Constipation, unspecified constipation type Has been having more regular bowel movements, encouraged if has watery loose stool to hold dose of Miralax.   2. Sacral back pain Will try to order a duoderm and/or a padding for his bed but he is in a regular bed. I have encouraged him to try to get up more this may help his back pain   3. Pruritus Denies seeing  a rash but has itching when going to Dialysis, will refill atarax  Will try to get the labs from [facility].    COVID-19 Education: The signs and  symptoms of COVID-19 were discussed with the patient and how to seek care for testing (follow up with PCP or arrange E-visit).  The importance of social distancing was discussed today.  Patient Risk:   After full review of this patients clinical status, I feel that they are at least moderate risk at this time.  Time:   Today, I have spent 14.52 minutes/ seconds with the patient with telehealth technology discussing above diagnoses.     Medication Adjustments/Labs and Tests Ordered: Current medicines are reviewed at length with the patient today.  Concerns regarding medicines are outlined above.   Tests Ordered: No orders of the defined types were placed in this encounter.   Medication Changes: Meds ordered this encounter  Medications   hydrOXYzine (ATARAX) 10 MG tablet    Sig: Take 1 tablet (10 mg total) by mouth every Monday, Wednesday, and Friday at 6 PM.    Dispense:  30 tablet    Refill:  5    Disposition:  Follow up prn  Signed, Arnette Felts, FNP

## 2021-07-10 DIAGNOSIS — D689 Coagulation defect, unspecified: Secondary | ICD-10-CM | POA: Diagnosis not present

## 2021-07-10 DIAGNOSIS — D631 Anemia in chronic kidney disease: Secondary | ICD-10-CM | POA: Diagnosis not present

## 2021-07-10 DIAGNOSIS — Z992 Dependence on renal dialysis: Secondary | ICD-10-CM | POA: Diagnosis not present

## 2021-07-10 DIAGNOSIS — N186 End stage renal disease: Secondary | ICD-10-CM | POA: Diagnosis not present

## 2021-07-10 DIAGNOSIS — N2581 Secondary hyperparathyroidism of renal origin: Secondary | ICD-10-CM | POA: Diagnosis not present

## 2021-07-10 DIAGNOSIS — D509 Iron deficiency anemia, unspecified: Secondary | ICD-10-CM | POA: Diagnosis not present

## 2021-07-11 ENCOUNTER — Other Ambulatory Visit: Payer: Self-pay | Admitting: *Deleted

## 2021-07-11 DIAGNOSIS — I252 Old myocardial infarction: Secondary | ICD-10-CM | POA: Diagnosis not present

## 2021-07-11 DIAGNOSIS — F32A Depression, unspecified: Secondary | ICD-10-CM | POA: Diagnosis not present

## 2021-07-11 DIAGNOSIS — E785 Hyperlipidemia, unspecified: Secondary | ICD-10-CM | POA: Diagnosis not present

## 2021-07-11 DIAGNOSIS — Z720 Tobacco use: Secondary | ICD-10-CM | POA: Diagnosis not present

## 2021-07-11 DIAGNOSIS — E7800 Pure hypercholesterolemia, unspecified: Secondary | ICD-10-CM | POA: Diagnosis not present

## 2021-07-11 DIAGNOSIS — Z681 Body mass index (BMI) 19 or less, adult: Secondary | ICD-10-CM | POA: Diagnosis not present

## 2021-07-11 DIAGNOSIS — K5641 Fecal impaction: Secondary | ICD-10-CM | POA: Diagnosis not present

## 2021-07-11 DIAGNOSIS — I5030 Unspecified diastolic (congestive) heart failure: Secondary | ICD-10-CM | POA: Diagnosis not present

## 2021-07-11 DIAGNOSIS — D638 Anemia in other chronic diseases classified elsewhere: Secondary | ICD-10-CM | POA: Diagnosis not present

## 2021-07-11 DIAGNOSIS — K5900 Constipation, unspecified: Secondary | ICD-10-CM | POA: Diagnosis not present

## 2021-07-11 DIAGNOSIS — I5022 Chronic systolic (congestive) heart failure: Secondary | ICD-10-CM | POA: Diagnosis not present

## 2021-07-11 DIAGNOSIS — R1310 Dysphagia, unspecified: Secondary | ICD-10-CM | POA: Diagnosis not present

## 2021-07-11 DIAGNOSIS — E43 Unspecified severe protein-calorie malnutrition: Secondary | ICD-10-CM | POA: Diagnosis not present

## 2021-07-11 DIAGNOSIS — N186 End stage renal disease: Secondary | ICD-10-CM | POA: Diagnosis not present

## 2021-07-11 DIAGNOSIS — I950 Idiopathic hypotension: Secondary | ICD-10-CM | POA: Diagnosis not present

## 2021-07-11 DIAGNOSIS — L89152 Pressure ulcer of sacral region, stage 2: Secondary | ICD-10-CM | POA: Diagnosis not present

## 2021-07-11 DIAGNOSIS — J9611 Chronic respiratory failure with hypoxia: Secondary | ICD-10-CM | POA: Diagnosis not present

## 2021-07-11 DIAGNOSIS — M1990 Unspecified osteoarthritis, unspecified site: Secondary | ICD-10-CM | POA: Diagnosis not present

## 2021-07-11 DIAGNOSIS — I132 Hypertensive heart and chronic kidney disease with heart failure and with stage 5 chronic kidney disease, or end stage renal disease: Secondary | ICD-10-CM | POA: Diagnosis not present

## 2021-07-11 DIAGNOSIS — R634 Abnormal weight loss: Secondary | ICD-10-CM | POA: Diagnosis not present

## 2021-07-11 DIAGNOSIS — Z992 Dependence on renal dialysis: Secondary | ICD-10-CM | POA: Diagnosis not present

## 2021-07-11 DIAGNOSIS — K769 Liver disease, unspecified: Secondary | ICD-10-CM | POA: Diagnosis not present

## 2021-07-11 DIAGNOSIS — K5909 Other constipation: Secondary | ICD-10-CM | POA: Diagnosis not present

## 2021-07-11 DIAGNOSIS — I480 Paroxysmal atrial fibrillation: Secondary | ICD-10-CM | POA: Diagnosis not present

## 2021-07-11 DIAGNOSIS — I2510 Atherosclerotic heart disease of native coronary artery without angina pectoris: Secondary | ICD-10-CM | POA: Diagnosis not present

## 2021-07-11 DIAGNOSIS — D631 Anemia in chronic kidney disease: Secondary | ICD-10-CM | POA: Diagnosis not present

## 2021-07-11 DIAGNOSIS — Z7901 Long term (current) use of anticoagulants: Secondary | ICD-10-CM | POA: Diagnosis not present

## 2021-07-11 NOTE — Patient Outreach (Signed)
[facility] Post- Acute Care Coordinator follow up. Telephone call made to Yonah Tangeman (daughter/DPR) [phone] to discuss [facility] follow up. Patient identifiers confirmed.  ? ?Reba reports member has had follow up with PCP [name] with [facility]. Discussed whether she would be interested in Probation officer making referral for CCM services with [facility]. Reba is agreeable.  ? ?Also Reba endorses prescription Hydroxyzine was filled. Also states she is supposed to go back up to pharmacy to get refund due to be charged even when she provided 30-day free Eliquis card.  ? ?Will make referral for [facility] CCM services with [facility].  ? ?Tyreece Gelles (daughter/DPR) is primary contact [phone]. ? ?Mr. Hankerson has medical history of ESRD, HLD, CAD, CHF, A.fib. ? ? ?[name], MSN, RN,BSN ?[place] Coordinator ?[phone] [facility]) ?[phone]  (Toll free office)   ?

## 2021-07-12 ENCOUNTER — Emergency Department (HOSPITAL_COMMUNITY): Payer: Medicare Other

## 2021-07-12 ENCOUNTER — Other Ambulatory Visit: Payer: Self-pay

## 2021-07-12 ENCOUNTER — Encounter (HOSPITAL_COMMUNITY): Payer: Self-pay

## 2021-07-12 ENCOUNTER — Emergency Department (HOSPITAL_COMMUNITY)
Admission: EM | Admit: 2021-07-12 | Discharge: 2021-07-12 | Disposition: A | Discharge status: Home / Self Care | Payer: Medicare Other | Attending: Emergency Medicine | Admitting: Emergency Medicine

## 2021-07-12 DIAGNOSIS — D689 Coagulation defect, unspecified: Secondary | ICD-10-CM | POA: Diagnosis not present

## 2021-07-12 DIAGNOSIS — R1084 Generalized abdominal pain: Secondary | ICD-10-CM | POA: Insufficient documentation

## 2021-07-12 DIAGNOSIS — Z7901 Long term (current) use of anticoagulants: Secondary | ICD-10-CM | POA: Diagnosis not present

## 2021-07-12 DIAGNOSIS — R066 Hiccough: Secondary | ICD-10-CM | POA: Insufficient documentation

## 2021-07-12 DIAGNOSIS — R0602 Shortness of breath: Secondary | ICD-10-CM | POA: Diagnosis not present

## 2021-07-12 DIAGNOSIS — D631 Anemia in chronic kidney disease: Secondary | ICD-10-CM | POA: Diagnosis not present

## 2021-07-12 DIAGNOSIS — I120 Hypertensive chronic kidney disease with stage 5 chronic kidney disease or end stage renal disease: Secondary | ICD-10-CM | POA: Diagnosis not present

## 2021-07-12 DIAGNOSIS — N186 End stage renal disease: Secondary | ICD-10-CM | POA: Diagnosis not present

## 2021-07-12 DIAGNOSIS — Z79899 Other long term (current) drug therapy: Secondary | ICD-10-CM | POA: Insufficient documentation

## 2021-07-12 DIAGNOSIS — D509 Iron deficiency anemia, unspecified: Secondary | ICD-10-CM | POA: Diagnosis not present

## 2021-07-12 DIAGNOSIS — I10 Essential (primary) hypertension: Secondary | ICD-10-CM | POA: Diagnosis not present

## 2021-07-12 DIAGNOSIS — R1013 Epigastric pain: Secondary | ICD-10-CM | POA: Diagnosis not present

## 2021-07-12 DIAGNOSIS — N2581 Secondary hyperparathyroidism of renal origin: Secondary | ICD-10-CM | POA: Diagnosis not present

## 2021-07-12 DIAGNOSIS — Z743 Need for continuous supervision: Secondary | ICD-10-CM | POA: Diagnosis not present

## 2021-07-12 DIAGNOSIS — R7989 Other specified abnormal findings of blood chemistry: Secondary | ICD-10-CM | POA: Insufficient documentation

## 2021-07-12 DIAGNOSIS — Z992 Dependence on renal dialysis: Secondary | ICD-10-CM | POA: Diagnosis not present

## 2021-07-12 DIAGNOSIS — I517 Cardiomegaly: Secondary | ICD-10-CM | POA: Diagnosis not present

## 2021-07-12 LAB — COMPREHENSIVE METABOLIC PANEL
ALT: 29 U/L (ref 0–44)
AST: 36 U/L (ref 15–41)
Albumin: 3.1 g/dL — ABNORMAL LOW (ref 3.5–5.0)
Alkaline Phosphatase: 93 U/L (ref 38–126)
Anion gap: 15 (ref 5–15)
BUN: 6 mg/dL — ABNORMAL LOW (ref 8–23)
CO2: 28 mmol/L (ref 22–32)
Calcium: 8.6 mg/dL — ABNORMAL LOW (ref 8.9–10.3)
Chloride: 95 mmol/L — ABNORMAL LOW (ref 98–111)
Creatinine, Ser: 3.29 mg/dL — ABNORMAL HIGH (ref 0.61–1.24)
GFR, Estimated: 19 mL/min — ABNORMAL LOW (ref >60)
Glucose, Bld: 97 mg/dL (ref 70–99)
Potassium: 3.6 mmol/L (ref 3.5–5.1)
Sodium: 138 mmol/L (ref 135–145)
Total Bilirubin: 1.2 mg/dL (ref 0.3–1.2)
Total Protein: 7.1 g/dL (ref 6.5–8.1)

## 2021-07-12 LAB — CBC WITH DIFFERENTIAL/PLATELET
Abs Immature Granulocytes: 0.02 10*3/uL (ref 0.00–0.07)
Basophils Absolute: 0 10*3/uL (ref 0.0–0.1)
Basophils Relative: 0 %
Eosinophils Absolute: 0.1 10*3/uL (ref 0.0–0.5)
Eosinophils Relative: 1 %
HCT: 33 % — ABNORMAL LOW (ref 39.0–52.0)
Hemoglobin: 10.8 g/dL — ABNORMAL LOW (ref 13.0–17.0)
Immature Granulocytes: 0 %
Lymphocytes Relative: 25 %
Lymphs Abs: 1.5 10*3/uL (ref 0.7–4.0)
MCH: 31 pg (ref 26.0–34.0)
MCHC: 32.7 g/dL (ref 30.0–36.0)
MCV: 94.8 fL (ref 80.0–100.0)
Monocytes Absolute: 0.7 10*3/uL (ref 0.1–1.0)
Monocytes Relative: 13 %
Neutro Abs: 3.5 10*3/uL (ref 1.7–7.7)
Neutrophils Relative %: 61 %
Platelets: 155 10*3/uL (ref 150–400)
RBC: 3.48 MIL/uL — ABNORMAL LOW (ref 4.22–5.81)
RDW: 17.2 % — ABNORMAL HIGH (ref 11.5–15.5)
WBC: 5.8 10*3/uL (ref 4.0–10.5)
nRBC: 0 % (ref 0.0–0.2)

## 2021-07-12 LAB — TROPONIN I (HIGH SENSITIVITY)
Troponin I (High Sensitivity): 29 ng/L — ABNORMAL HIGH (ref <18)
Troponin I (High Sensitivity): 33 ng/L — ABNORMAL HIGH (ref <18)

## 2021-07-12 LAB — LIPASE, BLOOD: Lipase: 77 U/L — ABNORMAL HIGH (ref 11–51)

## 2021-07-12 MED ORDER — METOCLOPRAMIDE HCL 5 MG/ML IJ SOLN
5.0000 mg | Freq: Once | INTRAMUSCULAR | Status: AC
  Administered 2021-07-12: 5 mg via INTRAVENOUS
  Filled 2021-07-12: qty 2

## 2021-07-12 MED ORDER — LORAZEPAM 2 MG/ML IJ SOLN
0.5000 mg | Freq: Once | INTRAMUSCULAR | Status: AC
  Administered 2021-07-12: 0.5 mg via INTRAVENOUS
  Filled 2021-07-12: qty 1

## 2021-07-12 MED ORDER — FAMOTIDINE 20 MG PO TABS: 20.0000 mg | ORAL_TABLET | Freq: Every day | ORAL | 0 refills | Status: DC

## 2021-07-12 MED ORDER — METOCLOPRAMIDE HCL 5 MG PO TABS: 5.0000 mg | ORAL_TABLET | Freq: Three times a day (TID) | ORAL | 0 refills | Status: DC | PRN

## 2021-07-12 MED ORDER — FAMOTIDINE IN NACL 20-0.9 MG/50ML-% IV SOLN
20.0000 mg | Freq: Once | INTRAVENOUS | Status: AC
  Administered 2021-07-12: 20 mg via INTRAVENOUS
  Filled 2021-07-12: qty 50

## 2021-07-12 MED ORDER — MIDODRINE HCL 5 MG PO TABS
10.0000 mg | ORAL_TABLET | Freq: Once | ORAL | Status: AC
  Administered 2021-07-12: 10 mg via ORAL
  Filled 2021-07-12: qty 2

## 2021-07-12 NOTE — Discharge Instructions (Signed)
Take reglan as directed  ? ?Please follow up with your primary care provider within 5-7 days for re-evaluation of your symptoms. If you do not have a primary care provider, information for a healthcare clinic has been provided for you to make arrangements for follow up care. Please return to the emergency department for any new or worsening symptoms. ? ?

## 2021-07-12 NOTE — ED Provider Notes (Signed)
[facility] EMERGENCY DEPARTMENT Provider Note   CSN: [id number] Arrival date & time: 07/12/21  1734     History  Chief Complaint  Patient presents with   Hiccups    Ruben [name] is a 76 y.o. male.  HPI   Pt is a 76 y/o male with a h/o arthritis, esrd (on dialysis), etoh abuse, hld, htn, mi, who presents to the ED today for eval of hiccups for the last 2 days. He has tried to take zofran at home without relief. He was seen by his pcp for this yesterday and was given omeprazol which also did not help his symptoms.   Denies abd pain,  He reports nausea and vomiting for the last several months. He hd some diarrhea earlier in the week but has not had a BM since then which was 4 days ago. He urinates about once every 3-4 days. He does report some dysuria. Denies fevers. Reports shortness of breath since his mi a few months ago. Denies chest pain.   MWF dialysis. Completed this earlier today, full session.   Home Medications Prior to Admission medications   Medication Sig Start Date End Date Taking? Authorizing Provider  metoCLOPramide (REGLAN) 5 MG tablet Take 1 tablet (5 mg total) by mouth every 8 (eight) hours as needed for nausea. 07/12/21  Yes ,  S, PA-C  acetaminophen (TYLENOL) 650 MG CR tablet Take 650 mg by mouth every 8 (eight) hours as needed for pain.    [provider]  albuterol (PROVENTIL) (2.5 MG/3ML) 0.083% nebulizer solution Take 3 mLs (2.5 mg total) by nebulization every 4 (four) hours as needed for wheezing or shortness of breath. 04/03/21 04/03/22  [name], FNP  amiodarone (PACERONE) 200 MG tablet Take 1 tablet (200 mg total) by mouth daily. 05/28/21   Clegg, Amy D, NP  apixaban (ELIQUIS) 5 MG TABS tablet Take 1 tablet (5 mg total) by mouth 2 (two) times daily. 05/28/21   Clegg, Amy D, NP  atorvastatin (LIPITOR) 80 MG tablet Take 1 tablet (80 mg total) by mouth daily. 05/28/21   Clegg, Amy D, NP  cholecalciferol  (VITAMIN D3) 25 MCG (1000 UNIT) tablet Take 1,000 Units by mouth daily.    [provider]  diltiazem 2 % GEL Apply 1 application topically 2 (two) times daily. Apply to anal fissures    [provider]  hydrocortisone (ANUSOL-HC) 25 MG suppository Place 1 suppository (25 mg total) rectally 2 (two) times daily. 06/20/21 07/20/21  Caren Griffins, MD  hydrOXYzine (ATARAX) 10 MG tablet Take 1 tablet (10 mg total) by mouth every Monday, Wednesday, and Friday at 6 PM. 07/10/21   [name], FNP  Lidocaine-Hydrocortisone Ace 3-2.5 % KIT Place 1 application rectally 2 (two) times daily. 06/03/21   [provider]  Magnesium 200 MG TABS Take 1 tablet (200 mg total) by mouth daily. With evening meal Patient taking differently: Take 400 mg by mouth daily. 10/22/20   [name], FNP  midodrine (PROAMATINE) 10 MG tablet Take 1 tablet (10 mg total) by mouth 3 (three) times daily with meals. 05/28/21   Clegg, Amy D, NP  mirtazapine (REMERON) 7.5 MG tablet Take 1 tablet (7.5 mg total) by mouth at bedtime. 06/20/21   Caren Griffins, MD  multivitamin (RENA-VIT) TABS tablet Take 1 tablet by mouth at bedtime. 05/28/21   Clegg, Amy D, NP  nitroGLYCERIN (NITROSTAT) 0.4 MG SL tablet Place 1 tablet (0.4 mg total) under the tongue  every 5 (five) minutes as needed for chest pain. 05/28/21 05/28/22  Darrick Grinder D, NP  ondansetron (ZOFRAN) 4 MG tablet Take 1 tablet (4 mg total) by mouth daily as needed for nausea or vomiting. 06/04/21 06/04/22  [name], FNP  oxyCODONE (OXY IR/ROXICODONE) 5 MG immediate release tablet Take 1 tablet (5 mg total) by mouth every 6 (six) hours as needed for moderate pain. 06/20/21   Caren Griffins, MD  phenylephrine (,USE FOR PREPARATION-H,) 0.25 % suppository Place 1 suppository rectally 2 (two) times daily. 05/30/21   [name], FNP  polyethylene glycol (MIRALAX / GLYCOLAX) 17 g packet Take 17 g by mouth 2 (two) times daily. 06/20/21 07/20/21  Caren Griffins, MD   senna-docusate (SENOKOT-S) 8.6-50 MG tablet Take 2 tablets by mouth 2 (two) times daily. 06/20/21 07/20/21  Caren Griffins, MD      Allergies    Other    Review of Systems   Review of Systems See HPI for pertinent positives or negatives.   Physical Exam Updated Vital Signs BP (!) 91/55    Pulse 73    Temp 98.7 F (37.1 C) (Oral)    Resp 15    Ht 5' 11" (1.803 m)    Wt 63 kg    SpO2 93%    BMI 19.39 kg/m  Physical Exam Vitals and nursing note reviewed.  Constitutional:      General: He is not in acute distress.    Appearance: He is well-developed.  HENT:     Head: Normocephalic and atraumatic.  Eyes:     Conjunctiva/sclera: Conjunctivae normal.  Cardiovascular:     Rate and Rhythm: Normal rate and regular rhythm.     Heart sounds: Normal heart sounds. No murmur heard. Pulmonary:     Effort: Pulmonary effort is normal. No respiratory distress.     Breath sounds: Normal breath sounds. No wheezing, rhonchi or rales.  Abdominal:     General: Bowel sounds are normal.     Palpations: Abdomen is soft.     Tenderness: There is generalized abdominal tenderness. There is no guarding or rebound.  Musculoskeletal:        General: No swelling.     Cervical back: Neck supple.  Skin:    General: Skin is warm and dry.     Capillary Refill: Capillary refill takes less than 2 seconds.  Neurological:     Mental Status: He is alert.  Psychiatric:        Mood and Affect: Mood normal.     ED Results / Procedures / Treatments   Labs (all labs ordered are listed, but only abnormal results are displayed) Labs Reviewed  CBC WITH DIFFERENTIAL/PLATELET - Abnormal; Notable for the following components:      Result Value   RBC 3.48 (*)    Hemoglobin 10.8 (*)    HCT 33.0 (*)    RDW 17.2 (*)    All other components within normal limits  COMPREHENSIVE METABOLIC PANEL - Abnormal; Notable for the following components:   Chloride 95 (*)    BUN 6 (*)    Creatinine, Ser 3.29 (*)    Calcium  8.6 (*)    Albumin 3.1 (*)    GFR, Estimated 19 (*)    All other components within normal limits  LIPASE, BLOOD - Abnormal; Notable for the following components:   Lipase 77 (*)    All other components within normal limits  TROPONIN I (HIGH SENSITIVITY) - Abnormal; Notable for  the following components:   Troponin I (High Sensitivity) 29 (*)    All other components within normal limits  TROPONIN I (HIGH SENSITIVITY) - Abnormal; Notable for the following components:   Troponin I (High Sensitivity) 33 (*)    All other components within normal limits  URINALYSIS, ROUTINE W REFLEX MICROSCOPIC    EKG EKG Interpretation  Date/Time:  Friday July 12 2021 18:40:41 EST Ventricular Rate:  83 PR Interval:  205 QRS Duration: 113 QT Interval:  439 QTC Calculation: 516 R Axis:   65 Text Interpretation: Sinus rhythm Probable left ventricular hypertrophy Nonspecific T abnormalities, lateral leads Prolonged QT interval No significant change since last tracing Confirmed by Blanchie Dessert [phone]) on 07/12/2021 9:02:57 PM  Radiology CT ABDOMEN PELVIS WO CONTRAST  Result Date: 07/12/2021 CLINICAL DATA:  Epigastric pain EXAM: CT ABDOMEN AND PELVIS WITHOUT CONTRAST TECHNIQUE: Multidetector CT imaging of the abdomen and pelvis was performed following the standard protocol without IV contrast. RADIATION DOSE REDUCTION: This exam was performed according to the departmental dose-optimization program which includes automated exposure control, adjustment of the mA and/or kV according to patient size and/or use of iterative reconstruction technique. COMPARISON:  MRI 06/17/2021, CT 06/12/2021, 06/07/2021 FINDINGS: Lower chest: Lung bases demonstrate resolution of previously noted right pleural effusion. Mild subpleural reticulation and ground-glass density at the lingula and left base. Cardiomegaly. Hepatobiliary: Hyperdensity in the gallbladder. No calcified stone. Hypodense liver lesions redemonstrated. No  biliary dilatation Pancreas: Unremarkable. No pancreatic ductal dilatation or surrounding inflammatory changes. Spleen: Normal in size without focal abnormality. Adrenals/Urinary Tract: Adrenal glands are normal. Kidneys show no hydronephrosis. Low-density renal lesions with a few small hyperdense lesions as before. No hydronephrosis. The bladder is unremarkable Stomach/Bowel: The stomach is nonenlarged. No dilated small bowel. No acute bowel wall thickening. Negative appendix. Vascular/Lymphatic: Moderate aortic atherosclerosis. No aneurysm. No suspicious lymph nodes Reproductive: Prostate is slightly enlarged. Other: Negative for pelvic effusion or free air. Musculoskeletal: No acute osseous abnormality. AVN of the bilateral femoral heads without collapse. IMPRESSION: 1. No CT evidence for acute intra-abdominal or pelvic abnormality. 2. Resolution of previously noted small right effusion. Cardiomegaly 3. Hyperdensity in the gallbladder which may be due to sludge 4. Hypodense liver and spleen lesions, see MRI from June 17, 2021 for additional findings and recommendations. Electronically Signed   By: Donavan Foil M.D.   On: 07/12/2021 20:42   DG Chest 2 View  Result Date: 07/12/2021 CLINICAL DATA:  Shortness of breath EXAM: CHEST - 2 VIEW COMPARISON:  06/21/2021 FINDINGS: Transverse diameter of heart is increased. Tip of dialysis catheter is seen in the right atrium. There is interval decrease in pulmonary vascular congestion. There is subtle increase in interstitial markings in the parahilar regions and lower lung fields. There is improvement in aeration of lower lung fields. Costophrenic angles are clear. There is no pneumothorax. IMPRESSION: Cardiomegaly. Slight prominence of interstitial markings in the parahilar regions and lower lung fields may suggest mild interstitial edema. Small linear densities in the left lower lung fields suggest subsegmental atelectasis. Electronically Signed   By: Elmer Picker M.D.   On: 07/12/2021 19:14    Procedures Procedures    Medications Ordered in ED Medications  LORazepam (ATIVAN) injection 0.5 mg (has no administration in time range)  metoCLOPramide (REGLAN) injection 5 mg (5 mg Intravenous Given 07/12/21 1836)  midodrine (PROAMATINE) tablet 10 mg (10 mg Oral Given 07/12/21 2123)  famotidine (PEPCID) IVPB 20 mg premix (20 mg Intravenous New Bag/Given 07/12/21 2123)  ED Course/ Medical Decision Making/ A&P                           Medical Decision Making  This patient presents to the ED for concern of hiccups, this involves an extensive number of treatment options, and is a complaint that carries with it a high risk of complications and morbidity.  The differential diagnosis includes but is not limited to gastritis/PUD, enteritis/duodenitis, appendicitis, cholelithiasis/cholecystitis, cholangitis, pancreatitis, ruptured viscus, colitis, diverticulitis, proctitis, cystitis, pyelonephritis, ureteral colic, aortic dissection, aortic aneurysm.  Atypical chest etiologies were also considered including ACS, PE, and pneumonia. Malignancy is also considered.  Comorbidities that complicate the patient evaluation: Patients presentation is complicated by their history of esrd, mi, etoh abuse, hld, htn  Additional history obtained: Additional history obtained from family Records reviewed previous admission documents and Care Everywhere/External Records  Lab Tests: I Ordered, and personally interpreted labs.  The pertinent results include:   CBC without leukocytosis, anemia present but stable from prior CMP with normal electrolytes, creatinine is elevated consistent with history of ESRD, LFTs within normal limits Lipase is marginally elevated Delta trops are marginally elevated but flat, consistent with history of ESRD, doubt ACS   Imaging Studies ordered: I ordered, independently visualized, and interpreted imaging which showed  CXR -  Cardiomegaly. Slight prominence of interstitial markings in the parahilar regions and lower lung fields may suggest mild interstitial edema. Small linear densities in the left lower lung fields suggest subsegmental atelectasis. CT abd/pelvis - 1. No CT evidence for acute intra-abdominal or pelvic abnormality. 2. Resolution of previously noted small right effusion. Cardiomegaly 3. Hyperdensity in the gallbladder which may be due to sludge 4. Hypodense liver and spleen lesions, see MRI from June 17, 2021 for additional findings and recommendations.  I agree with the radiologist interpretation  Cardiac Monitoring: The patient was maintained on a cardiac monitor.  I personally viewed and interpreted the cardiac monitor which showed an underlying rhythm of:  sinus rhythm  Medicines ordered and prescription drug management: I ordered medication including reglan  for hiccups  Reevaluation of the patient after these medicines showed that the patient     temporarily improved but then recurred   Complexity of problems addressed: Patients presentation is most consistent with  acute complicated illness/injury requiring diagnostic workup  Disposition: After consideration of the diagnostic results and the patients response to treatment,  I feel that the patent would benefit from discharge home with Reglan.  Patient initially presenting for hiccups.  Was having some upper abdominal pain and has had some nausea and vomiting for the last few months.  His CT scan is reassuring and he was given Reglan and hiccups stopped temporarily however recurred.  Given his work-up is otherwise unremarkable I feel that he is appropriate for discharge home after his hiccups have improved.  He will be given an additional dose of Ativan.  At shift change care was transitioned to Dr. Maryan Rued to reassess the patient after Ativan. .    Final Clinical Impression(s) / ED Diagnoses Final diagnoses:  Hiccups    Rx / DC  Orders ED Discharge Orders          Ordered    metoCLOPramide (REGLAN) 5 MG tablet  Every 8 hours PRN        07/12/21 2215              Rodney Booze, PA-C 07/12/21 2216    Lajean Saver, MD  07/13/21 1035

## 2021-07-12 NOTE — ED Triage Notes (Signed)
Patient BIB [facility], complaint of hiccups x2 days. Pt did complete dialysis today, MI in December. VSS. NAD. ?

## 2021-07-15 DIAGNOSIS — D631 Anemia in chronic kidney disease: Secondary | ICD-10-CM | POA: Diagnosis not present

## 2021-07-15 DIAGNOSIS — N186 End stage renal disease: Secondary | ICD-10-CM | POA: Diagnosis not present

## 2021-07-15 DIAGNOSIS — N2581 Secondary hyperparathyroidism of renal origin: Secondary | ICD-10-CM | POA: Diagnosis not present

## 2021-07-15 DIAGNOSIS — D509 Iron deficiency anemia, unspecified: Secondary | ICD-10-CM | POA: Diagnosis not present

## 2021-07-15 DIAGNOSIS — D689 Coagulation defect, unspecified: Secondary | ICD-10-CM | POA: Diagnosis not present

## 2021-07-15 DIAGNOSIS — Z992 Dependence on renal dialysis: Secondary | ICD-10-CM | POA: Diagnosis not present

## 2021-07-16 ENCOUNTER — Telehealth: Payer: Self-pay | Admitting: Nurse Practitioner

## 2021-07-16 DIAGNOSIS — I480 Paroxysmal atrial fibrillation: Secondary | ICD-10-CM | POA: Diagnosis not present

## 2021-07-16 DIAGNOSIS — I2510 Atherosclerotic heart disease of native coronary artery without angina pectoris: Secondary | ICD-10-CM | POA: Diagnosis not present

## 2021-07-16 DIAGNOSIS — D631 Anemia in chronic kidney disease: Secondary | ICD-10-CM | POA: Diagnosis not present

## 2021-07-16 DIAGNOSIS — K769 Liver disease, unspecified: Secondary | ICD-10-CM | POA: Diagnosis not present

## 2021-07-16 DIAGNOSIS — I132 Hypertensive heart and chronic kidney disease with heart failure and with stage 5 chronic kidney disease, or end stage renal disease: Secondary | ICD-10-CM | POA: Diagnosis not present

## 2021-07-16 DIAGNOSIS — L89152 Pressure ulcer of sacral region, stage 2: Secondary | ICD-10-CM | POA: Diagnosis not present

## 2021-07-16 DIAGNOSIS — I5022 Chronic systolic (congestive) heart failure: Secondary | ICD-10-CM | POA: Diagnosis not present

## 2021-07-16 DIAGNOSIS — E785 Hyperlipidemia, unspecified: Secondary | ICD-10-CM | POA: Diagnosis not present

## 2021-07-16 DIAGNOSIS — N186 End stage renal disease: Secondary | ICD-10-CM | POA: Diagnosis not present

## 2021-07-16 DIAGNOSIS — Z7901 Long term (current) use of anticoagulants: Secondary | ICD-10-CM | POA: Diagnosis not present

## 2021-07-16 DIAGNOSIS — K5641 Fecal impaction: Secondary | ICD-10-CM | POA: Diagnosis not present

## 2021-07-16 DIAGNOSIS — Z720 Tobacco use: Secondary | ICD-10-CM | POA: Diagnosis not present

## 2021-07-16 DIAGNOSIS — K5900 Constipation, unspecified: Secondary | ICD-10-CM | POA: Diagnosis not present

## 2021-07-16 DIAGNOSIS — Z681 Body mass index (BMI) 19 or less, adult: Secondary | ICD-10-CM | POA: Diagnosis not present

## 2021-07-16 DIAGNOSIS — M1990 Unspecified osteoarthritis, unspecified site: Secondary | ICD-10-CM | POA: Diagnosis not present

## 2021-07-16 DIAGNOSIS — J9611 Chronic respiratory failure with hypoxia: Secondary | ICD-10-CM | POA: Diagnosis not present

## 2021-07-16 DIAGNOSIS — E43 Unspecified severe protein-calorie malnutrition: Secondary | ICD-10-CM | POA: Diagnosis not present

## 2021-07-16 DIAGNOSIS — I5030 Unspecified diastolic (congestive) heart failure: Secondary | ICD-10-CM | POA: Diagnosis not present

## 2021-07-16 DIAGNOSIS — R1310 Dysphagia, unspecified: Secondary | ICD-10-CM | POA: Diagnosis not present

## 2021-07-16 DIAGNOSIS — F32A Depression, unspecified: Secondary | ICD-10-CM | POA: Diagnosis not present

## 2021-07-16 DIAGNOSIS — I252 Old myocardial infarction: Secondary | ICD-10-CM | POA: Diagnosis not present

## 2021-07-16 DIAGNOSIS — R634 Abnormal weight loss: Secondary | ICD-10-CM | POA: Diagnosis not present

## 2021-07-16 NOTE — Telephone Encounter (Signed)
Left message for patient to call back and schedule Medicare Annual Wellness Visit (AWV) either virtually or in office.  Left both my jabber number [phone] and office number  ? ? ? awvi 02/02/21 per [facility] ? please schedule at anytime with [facility]  ? ? ?This should be a 45 minute visit.  ?

## 2021-07-17 DIAGNOSIS — D689 Coagulation defect, unspecified: Secondary | ICD-10-CM | POA: Diagnosis not present

## 2021-07-17 DIAGNOSIS — D509 Iron deficiency anemia, unspecified: Secondary | ICD-10-CM | POA: Diagnosis not present

## 2021-07-17 DIAGNOSIS — N186 End stage renal disease: Secondary | ICD-10-CM | POA: Diagnosis not present

## 2021-07-17 DIAGNOSIS — D631 Anemia in chronic kidney disease: Secondary | ICD-10-CM | POA: Diagnosis not present

## 2021-07-17 DIAGNOSIS — N2581 Secondary hyperparathyroidism of renal origin: Secondary | ICD-10-CM | POA: Diagnosis not present

## 2021-07-17 DIAGNOSIS — I950 Idiopathic hypotension: Secondary | ICD-10-CM | POA: Diagnosis not present

## 2021-07-17 DIAGNOSIS — R040 Epistaxis: Secondary | ICD-10-CM | POA: Diagnosis not present

## 2021-07-17 DIAGNOSIS — Z992 Dependence on renal dialysis: Secondary | ICD-10-CM | POA: Diagnosis not present

## 2021-07-18 ENCOUNTER — Encounter: Payer: Self-pay | Admitting: Nurse Practitioner

## 2021-07-18 ENCOUNTER — Other Ambulatory Visit: Payer: Self-pay

## 2021-07-18 ENCOUNTER — Ambulatory Visit (INDEPENDENT_AMBULATORY_CARE_PROVIDER_SITE_OTHER): Payer: Medicare Other | Admitting: Physician Assistant

## 2021-07-18 VITALS — BP 96/56 | HR 92 | Temp 94.9°F | Ht 71.0 in | Wt 137.4 lb

## 2021-07-18 DIAGNOSIS — D631 Anemia in chronic kidney disease: Secondary | ICD-10-CM | POA: Diagnosis not present

## 2021-07-18 DIAGNOSIS — E785 Hyperlipidemia, unspecified: Secondary | ICD-10-CM | POA: Diagnosis not present

## 2021-07-18 DIAGNOSIS — L89152 Pressure ulcer of sacral region, stage 2: Secondary | ICD-10-CM | POA: Diagnosis not present

## 2021-07-18 DIAGNOSIS — Z7901 Long term (current) use of anticoagulants: Secondary | ICD-10-CM | POA: Diagnosis not present

## 2021-07-18 DIAGNOSIS — J9611 Chronic respiratory failure with hypoxia: Secondary | ICD-10-CM | POA: Diagnosis not present

## 2021-07-18 DIAGNOSIS — K769 Liver disease, unspecified: Secondary | ICD-10-CM | POA: Diagnosis not present

## 2021-07-18 DIAGNOSIS — N186 End stage renal disease: Secondary | ICD-10-CM | POA: Diagnosis not present

## 2021-07-18 DIAGNOSIS — I132 Hypertensive heart and chronic kidney disease with heart failure and with stage 5 chronic kidney disease, or end stage renal disease: Secondary | ICD-10-CM | POA: Diagnosis not present

## 2021-07-18 DIAGNOSIS — F32A Depression, unspecified: Secondary | ICD-10-CM | POA: Diagnosis not present

## 2021-07-18 DIAGNOSIS — I2510 Atherosclerotic heart disease of native coronary artery without angina pectoris: Secondary | ICD-10-CM | POA: Diagnosis not present

## 2021-07-18 DIAGNOSIS — M1990 Unspecified osteoarthritis, unspecified site: Secondary | ICD-10-CM | POA: Diagnosis not present

## 2021-07-18 DIAGNOSIS — Z992 Dependence on renal dialysis: Secondary | ICD-10-CM

## 2021-07-18 DIAGNOSIS — Z681 Body mass index (BMI) 19 or less, adult: Secondary | ICD-10-CM | POA: Diagnosis not present

## 2021-07-18 DIAGNOSIS — K5900 Constipation, unspecified: Secondary | ICD-10-CM | POA: Diagnosis not present

## 2021-07-18 DIAGNOSIS — R634 Abnormal weight loss: Secondary | ICD-10-CM | POA: Diagnosis not present

## 2021-07-18 DIAGNOSIS — I5030 Unspecified diastolic (congestive) heart failure: Secondary | ICD-10-CM | POA: Diagnosis not present

## 2021-07-18 DIAGNOSIS — I5022 Chronic systolic (congestive) heart failure: Secondary | ICD-10-CM | POA: Diagnosis not present

## 2021-07-18 DIAGNOSIS — I480 Paroxysmal atrial fibrillation: Secondary | ICD-10-CM | POA: Diagnosis not present

## 2021-07-18 DIAGNOSIS — Z720 Tobacco use: Secondary | ICD-10-CM | POA: Diagnosis not present

## 2021-07-18 DIAGNOSIS — K5641 Fecal impaction: Secondary | ICD-10-CM | POA: Diagnosis not present

## 2021-07-18 DIAGNOSIS — I252 Old myocardial infarction: Secondary | ICD-10-CM | POA: Diagnosis not present

## 2021-07-18 DIAGNOSIS — E43 Unspecified severe protein-calorie malnutrition: Secondary | ICD-10-CM | POA: Diagnosis not present

## 2021-07-18 DIAGNOSIS — R1310 Dysphagia, unspecified: Secondary | ICD-10-CM | POA: Diagnosis not present

## 2021-07-18 NOTE — Progress Notes (Signed)
? ? ?  Postoperative Access Visit ? ? ?History of Present Illness  ? ?Ruben [name] is a 76 y.o. year old male who presents for postoperative follow-up for:  Left arm brachial artery to axillary vein arteriovenous graft by Dr. Virl Cagey on 06/27/21. The patient's wounds are well healed.  The patient notes no steal symptoms.  The patient is  able to complete their activities of daily living.  ? ?He currently dialyzes via right IJ TDC on MWF at [facility] location ?Physical Examination  ? ?Vitals:  ? 07/18/21 1457  ?BP: (!) 96/56  ?Pulse: 92  ?Temp: (!) 94.9 ?F (34.9 ?C)  ?Weight: 137 lb 6.4 oz (62.3 kg)  ?Height: '5\' 11"'$  (1.803 m)  ? ?Body mass index is 19.16 kg/m?. ? ?left arm Incisions are healed, 2+ radial pulse, hand grip is 5/5, sensation in digits is intact, palpable thrill, bruit can be auscultated   ? ? ?Medical Decision Making  ? ?Ruben [name] is a 76 y.o. year old male who presents s/p Left arm brachial artery to axillary vein arteriovenous graft by Dr. Virl Cagey on 06/27/21. His incisions are well healed. His graft has great thrill.  ? ?Patent is without signs or symptoms of steal syndrome ?The patient's access will be ready for use 07/25/21 ?The patient's tunneled dialysis catheter can be removed when Nephrology is comfortable with the performance of the left AVG ?The patient may follow up on a prn basis ? ? ?Karoline Caldwell, PA-C ?[facility] ?Office: [phone] ? ?Clinic MD: Dr. Scot Dock  ?

## 2021-07-19 DIAGNOSIS — Z992 Dependence on renal dialysis: Secondary | ICD-10-CM | POA: Diagnosis not present

## 2021-07-19 DIAGNOSIS — N186 End stage renal disease: Secondary | ICD-10-CM | POA: Diagnosis not present

## 2021-07-19 DIAGNOSIS — D631 Anemia in chronic kidney disease: Secondary | ICD-10-CM | POA: Diagnosis not present

## 2021-07-19 DIAGNOSIS — D509 Iron deficiency anemia, unspecified: Secondary | ICD-10-CM | POA: Diagnosis not present

## 2021-07-19 DIAGNOSIS — N2581 Secondary hyperparathyroidism of renal origin: Secondary | ICD-10-CM | POA: Diagnosis not present

## 2021-07-19 DIAGNOSIS — D689 Coagulation defect, unspecified: Secondary | ICD-10-CM | POA: Diagnosis not present

## 2021-07-22 ENCOUNTER — Other Ambulatory Visit: Payer: Self-pay | Admitting: Nurse Practitioner

## 2021-07-22 DIAGNOSIS — D631 Anemia in chronic kidney disease: Secondary | ICD-10-CM | POA: Diagnosis not present

## 2021-07-22 DIAGNOSIS — I5022 Chronic systolic (congestive) heart failure: Secondary | ICD-10-CM

## 2021-07-22 DIAGNOSIS — N2581 Secondary hyperparathyroidism of renal origin: Secondary | ICD-10-CM | POA: Diagnosis not present

## 2021-07-22 DIAGNOSIS — I252 Old myocardial infarction: Secondary | ICD-10-CM

## 2021-07-22 DIAGNOSIS — D509 Iron deficiency anemia, unspecified: Secondary | ICD-10-CM | POA: Diagnosis not present

## 2021-07-22 DIAGNOSIS — Z992 Dependence on renal dialysis: Secondary | ICD-10-CM | POA: Diagnosis not present

## 2021-07-22 DIAGNOSIS — N186 End stage renal disease: Secondary | ICD-10-CM

## 2021-07-22 DIAGNOSIS — D689 Coagulation defect, unspecified: Secondary | ICD-10-CM | POA: Diagnosis not present

## 2021-07-22 NOTE — Progress Notes (Signed)
? ?ADVANCED HF CLINIC CONSULT NOTE ? ?Primary Care: [name], MD ?HF Cardiologist: Dr. Aundra Dubin ? ?HPI: ?Ruben [name] 76 y.o. male with history of HTN, HLD, CKD, CKD 3b, tobacco and alcohol abuse (pint a day of wine), depression.  ? ?Admitted 04/29/21 with suspected late presentation MI. Went into AF with RVR and placed on BiPAP. He was initiated on IV amio, heparin gtt, nitro gtt and IV diuretics. Echo with LVEF 35-40%, inferior hypokinesis, RV okay, splay artifact with likely moderate to severe MR. Subsequently developed CGS. PICC line placed.  Initial Co-ox low at 47% and started on NE + milrinone. Advanced HF team consulted. Underwent TEE showing EF 35-40%, RV okay, moderate AI, moderate MR (suspect infarct related). R/LHC (initially delayed d/t AKI on CKD): Occluded RCA (culprit), diffuse disease in LAD and Lcx. On 0.25 mil RA mean 16, PA 66/26 (41), PCWP 27, PA 62%, Fick CO 5.04/ CI 2.56.  ?  ?Seen by CVTS. Not felt to be a surgical candidate. Attempted to wean off milrinone. Remained in AF despite Amio gtt. Underwent TEE/DCCV --> SR. Repeat echo EF 35-40%, RV okay, moderate AI, trivial TR, moderate, likely infarct-related, MR. IV amio weaned off and transitioned to amio 200 mg daily. After procedures completed he was started on Eliquis. Course c/b AKI on CKD, nephrology consulted. Felt to be d/t worsening ATN in setting of shock/episodes of hypotension versus CIN. Placed on CVVHD and later switched to iHD. Had tunneled HD catheter placed. Vascular consulted and placed AVG. Discharged home, weight 165 lbs. ? ?Admitted 2/23 with rectal pain and dysphagia. GI consulted and patient manually disimpacted. Had MRI liver due to elevated LFTs which showed 12 mm benign lesions.  ? ?Today he returns for post hospital HF follow up with his daughter. Overall feeling weak and fatigued. Says his Lipitor made him feel bad in the past, wondering if it is contributing to his symptoms now. He is not markedly SOB  walking on flat ground with his walker if he takes his time. Wears 2-2.5 L oxygen PRN. Continues with HH PT/OT. Denies abnormal bleeding, palpitations, CP, dizziness, edema, or PND/Orthopnea. Appetite poor. No fever or chills. Weight at home stable. Taking all medications. He has Home Health through [place] [facility]. No issues with HD. Not sleeping well. ? ?ECG (personally reviewed): NSR 85 bpm ? ?Labs (3/23): K 3.6, creatinine 3.29 ? ?Review of Systems: [y] = yes, _0  = no  ? ?General: Weight gain _1 ; Weight loss _2 ; Anorexia Blue.Reese ]; Fatigue Blue.Reese ]; Fever _3 ; Chills _4 ; Weakness Blue.Reese ]  ?Cardiac: Chest pain/pressure _5 ; Resting SOB _6 ; Exertional SOB Blue.Reese ]; Orthopnea _7 ; Pedal Edema _8 ; Palpitations _9 ; Syncope _10 ; Presyncope _11 ; Paroxysmal nocturnal dyspnea_12   ?Pulmonary: Cough _13 ; Wheezing_14 ; Hemoptysis_15 ; Sputum _16 ; Snoring _17   ?GI: Vomiting_18 ; Dysphagia_19 ; Melena_20 ; Hematochezia _21 ; Heartburn_22 ; Abdominal pain _23 ; Constipation _24 ; Diarrhea _25 ; BRBPR _26   ?GU: Hematuria_27 ; Dysuria _28 ; Nocturia_29   ?Vascular: Pain in legs with walking _30 ; Pain in feet with lying flat _31 ; Non-healing sores _32 ; Stroke _33 ; TIA _34 ; Slurred speech _35 ;  ?Neuro: Headaches_36 ; Vertigo_37 ; Seizures_38 ; Paresthesias_39 ;Blurred vision _40 ; Diplopia _41 ; Vision changes _42   ?Ortho/Skin: Arthritis _43 ; Joint pain _44 ; Muscle pain _45 ; Joint swelling _46 ; Back Pain _47 ; Rash _48   ?  Psych: Depression[y ]; Anxiety[y ]  ?Heme: Bleeding problems _0 ; Clotting disorders _1 ; Anemia Blue.Reese ]  ?Endocrine: Diabetes [ y]; Thyroid dysfunction_2  ? ?Past Medical History:  ?Diagnosis Date  ? Arthritis   ? Chronic kidney disease, stage 3b ([place])   ? ESRD (end stage renal disease) ([place])   ? ETOH abuse   ? History of stress test 09/02/2008  ? showed inferolateral scar without ischemia  ? Hx of echocardiogram 09/02/2008  ? was essentially normal  ? Hyperlipidemia   ? Hypertension   ? Myocardial infarction [facility])   ? Tobacco abuse   ? ?Current  Outpatient Medications  ?Medication Sig Dispense Refill  ? acetaminophen (TYLENOL) 650 MG CR tablet Take 650 mg by mouth every 8 (eight) hours as needed for pain.    ? albuterol (PROVENTIL) (2.5 MG/3ML) 0.083% nebulizer solution Take 3 mLs (2.5 mg total) by nebulization every 4 (four) hours as needed for wheezing or shortness of breath. 75 mL 2  ? amiodarone (PACERONE) 200 MG tablet Take 1 tablet (200 mg total) by mouth daily. 30 tablet 6  ? apixaban (ELIQUIS) 5 MG TABS tablet Take 1 tablet (5 mg total) by mouth 2 (two) times daily. 60 tablet 6  ? atorvastatin (LIPITOR) 80 MG tablet Take 1 tablet (80 mg total) by mouth daily. 30 tablet 6  ? cholecalciferol (VITAMIN D3) 25 MCG (1000 UNIT) tablet Take 1,000 Units by mouth daily.    ? diltiazem 2 % GEL Apply 1 application. topically 2 (two) times daily. Apply to anal fissures as needed    ? famotidine (PEPCID) 20 MG tablet Take 1 tablet (20 mg total) by mouth daily. Take 1 tablet after dialysis for 2 weeks 6 tablet 0  ? hydrOXYzine (ATARAX) 10 MG tablet Take 1 tablet (10 mg total) by mouth every Monday, Wednesday, and Friday at 6 PM. 30 tablet 5  ? Lidocaine-Hydrocortisone Ace 3-2.5 % KIT Place 1 application. rectally 2 (two) times daily. As needed    ? magnesium oxide (MAG-OX) 400 (240 Mg) MG tablet Take 1 tablet by mouth daily.    ? metoCLOPramide (REGLAN) 5 MG tablet Take 1 tablet (5 mg total) by mouth every 8 (eight) hours as needed for nausea. 6 tablet 0  ? midodrine (PROAMATINE) 10 MG tablet Take 1 tablet (10 mg total) by mouth 3 (three) times daily with meals. 90 tablet 6  ? mirtazapine (REMERON) 7.5 MG tablet Take 1 tablet (7.5 mg total) by mouth at bedtime. 30 tablet 0  ? multivitamin (RENA-VIT) TABS tablet Take 1 tablet by mouth at bedtime. 30 tablet 6  ? nitroGLYCERIN (NITROSTAT) 0.4 MG SL tablet Place 1 tablet (0.4 mg total) under the tongue every 5 (five) minutes as needed for chest pain. 100 tablet 3  ? ondansetron (ZOFRAN) 4 MG tablet Take 1 tablet (4 mg  total) by mouth daily as needed for nausea or vomiting. 30 tablet 1  ? phenylephrine (,USE FOR PREPARATION-H,) 0.25 % suppository Place 1 suppository rectally 2 (two) times daily. (Patient taking differently: Place 1 suppository rectally 2 (two) times daily. As needed) 12 suppository 0  ? ?No current facility-administered medications for this encounter.  ? ?Allergies  ?Allergen Reactions  ? Other Other (See Comments)  ?  Pollen - sneezing, itching/watery eyes  ? ?Social History  ? ?Socioeconomic History  ? Marital status: Single  ?  Spouse name: Not on file  ? Number of children: Not on file  ? Years of education: Not on  file  ? Highest education level: Not on file  ?Occupational History  ? Not on file  ?Tobacco Use  ? Smoking status: Former  ?  Packs/day: 1.50  ?  Years: 20.00  ?  Pack years: 30.00  ?  Types: Cigarettes  ? Smokeless tobacco: Never  ? Tobacco comments:  ?  down to 2 packs per week; 3/21 - down to 1 Pack per week  ?Vaping Use  ? Vaping Use: Never used  ?Substance and Sexual Activity  ? Alcohol use: No  ?  Alcohol/week: 0.0 standard drinks  ? Drug use: No  ? Sexual activity: Not on file  ?Other Topics Concern  ? Not on file  ?Social History Narrative  ? Not on file  ? ?Social Determinants of Health  ? ?Financial Resource Strain: Medium Risk  ? Difficulty of Paying Living Expenses: Somewhat hard  ?Food Insecurity: No Food Insecurity  ? Worried About Charity fundraiser in the Last Year: Never true  ? Ran Out of Food in the Last Year: Never true  ?Transportation Needs: No Transportation Needs  ? Lack of Transportation (Medical): No  ? Lack of Transportation (Non-Medical): No  ?Physical Activity: Not on file  ?Stress: Not on file  ?Social Connections: Not on file  ?Intimate Partner Violence: Not on file  ? ?Family History  ?Problem Relation Age of Onset  ? Heart attack Brother   ? Diabetes Brother   ? Heart disease Brother   ? Cancer Father   ? Diabetes Brother   ? Heart disease Brother   ? ?BP (!) 90/52    Pulse 86   Wt 62.7 kg   SpO2 100%   BMI 19.27 kg/m?  ? ?Wt Readings from Last 3 Encounters:  ?07/23/21 62.7 kg  ?07/18/21 62.3 kg  ?07/12/21 63 kg  ? ?PHYSICAL EXAM: ?General:  NAD. No resp difficulty, chr

## 2021-07-23 ENCOUNTER — Other Ambulatory Visit: Payer: Self-pay

## 2021-07-23 ENCOUNTER — Telehealth: Payer: Self-pay | Admitting: *Deleted

## 2021-07-23 ENCOUNTER — Encounter (HOSPITAL_COMMUNITY): Payer: Self-pay

## 2021-07-23 ENCOUNTER — Ambulatory Visit (HOSPITAL_COMMUNITY)
Admission: RE | Admit: 2021-07-23 | Discharge: 2021-07-23 | Disposition: A | Discharge status: Home / Self Care | Payer: Medicare Other | Source: Ambulatory Visit | Attending: Family Medicine | Admitting: Family Medicine

## 2021-07-23 VITALS — BP 90/52 | HR 86 | Wt 138.2 lb

## 2021-07-23 DIAGNOSIS — I4891 Unspecified atrial fibrillation: Secondary | ICD-10-CM | POA: Insufficient documentation

## 2021-07-23 DIAGNOSIS — F1011 Alcohol abuse, in remission: Secondary | ICD-10-CM

## 2021-07-23 DIAGNOSIS — Z7901 Long term (current) use of anticoagulants: Secondary | ICD-10-CM | POA: Insufficient documentation

## 2021-07-23 DIAGNOSIS — Z87891 Personal history of nicotine dependence: Secondary | ICD-10-CM | POA: Diagnosis not present

## 2021-07-23 DIAGNOSIS — N186 End stage renal disease: Secondary | ICD-10-CM | POA: Insufficient documentation

## 2021-07-23 DIAGNOSIS — I5021 Acute systolic (congestive) heart failure: Secondary | ICD-10-CM | POA: Diagnosis not present

## 2021-07-23 DIAGNOSIS — R634 Abnormal weight loss: Secondary | ICD-10-CM | POA: Diagnosis not present

## 2021-07-23 DIAGNOSIS — K769 Liver disease, unspecified: Secondary | ICD-10-CM | POA: Diagnosis not present

## 2021-07-23 DIAGNOSIS — I132 Hypertensive heart and chronic kidney disease with heart failure and with stage 5 chronic kidney disease, or end stage renal disease: Secondary | ICD-10-CM | POA: Diagnosis not present

## 2021-07-23 DIAGNOSIS — D696 Thrombocytopenia, unspecified: Secondary | ICD-10-CM | POA: Diagnosis not present

## 2021-07-23 DIAGNOSIS — I340 Nonrheumatic mitral (valve) insufficiency: Secondary | ICD-10-CM | POA: Diagnosis not present

## 2021-07-23 DIAGNOSIS — R739 Hyperglycemia, unspecified: Secondary | ICD-10-CM | POA: Insufficient documentation

## 2021-07-23 DIAGNOSIS — F32A Depression, unspecified: Secondary | ICD-10-CM | POA: Diagnosis not present

## 2021-07-23 DIAGNOSIS — I2510 Atherosclerotic heart disease of native coronary artery without angina pectoris: Secondary | ICD-10-CM | POA: Insufficient documentation

## 2021-07-23 DIAGNOSIS — J9611 Chronic respiratory failure with hypoxia: Secondary | ICD-10-CM | POA: Diagnosis not present

## 2021-07-23 DIAGNOSIS — I480 Paroxysmal atrial fibrillation: Secondary | ICD-10-CM

## 2021-07-23 DIAGNOSIS — I5022 Chronic systolic (congestive) heart failure: Secondary | ICD-10-CM | POA: Diagnosis not present

## 2021-07-23 DIAGNOSIS — I252 Old myocardial infarction: Secondary | ICD-10-CM | POA: Diagnosis not present

## 2021-07-23 DIAGNOSIS — E871 Hypo-osmolality and hyponatremia: Secondary | ICD-10-CM

## 2021-07-23 DIAGNOSIS — I255 Ischemic cardiomyopathy: Secondary | ICD-10-CM | POA: Insufficient documentation

## 2021-07-23 DIAGNOSIS — M1990 Unspecified osteoarthritis, unspecified site: Secondary | ICD-10-CM | POA: Diagnosis not present

## 2021-07-23 DIAGNOSIS — H938X2 Other specified disorders of left ear: Secondary | ICD-10-CM | POA: Diagnosis not present

## 2021-07-23 DIAGNOSIS — E785 Hyperlipidemia, unspecified: Secondary | ICD-10-CM | POA: Insufficient documentation

## 2021-07-23 DIAGNOSIS — L89152 Pressure ulcer of sacral region, stage 2: Secondary | ICD-10-CM | POA: Diagnosis not present

## 2021-07-23 DIAGNOSIS — I5030 Unspecified diastolic (congestive) heart failure: Secondary | ICD-10-CM | POA: Diagnosis not present

## 2021-07-23 DIAGNOSIS — Z720 Tobacco use: Secondary | ICD-10-CM | POA: Diagnosis not present

## 2021-07-23 DIAGNOSIS — Z79899 Other long term (current) drug therapy: Secondary | ICD-10-CM | POA: Diagnosis not present

## 2021-07-23 DIAGNOSIS — N179 Acute kidney failure, unspecified: Secondary | ICD-10-CM | POA: Insufficient documentation

## 2021-07-23 DIAGNOSIS — K5641 Fecal impaction: Secondary | ICD-10-CM | POA: Diagnosis not present

## 2021-07-23 DIAGNOSIS — I959 Hypotension, unspecified: Secondary | ICD-10-CM | POA: Diagnosis not present

## 2021-07-23 DIAGNOSIS — Z681 Body mass index (BMI) 19 or less, adult: Secondary | ICD-10-CM | POA: Diagnosis not present

## 2021-07-23 DIAGNOSIS — K5900 Constipation, unspecified: Secondary | ICD-10-CM | POA: Diagnosis not present

## 2021-07-23 DIAGNOSIS — E43 Unspecified severe protein-calorie malnutrition: Secondary | ICD-10-CM | POA: Diagnosis not present

## 2021-07-23 DIAGNOSIS — F1010 Alcohol abuse, uncomplicated: Secondary | ICD-10-CM | POA: Insufficient documentation

## 2021-07-23 DIAGNOSIS — R1310 Dysphagia, unspecified: Secondary | ICD-10-CM | POA: Diagnosis not present

## 2021-07-23 DIAGNOSIS — D631 Anemia in chronic kidney disease: Secondary | ICD-10-CM | POA: Diagnosis not present

## 2021-07-23 LAB — LIPID PANEL
Cholesterol: 129 mg/dL (ref 0–200)
HDL: 37 mg/dL — ABNORMAL LOW (ref >40)
LDL Cholesterol: 76 mg/dL (ref 0–99)
Total CHOL/HDL Ratio: 3.5 RATIO
Triglycerides: 82 mg/dL (ref <150)
VLDL: 16 mg/dL (ref 0–40)

## 2021-07-23 LAB — CK: Total CK: 31 U/L — ABNORMAL LOW (ref 49–397)

## 2021-07-23 MED ORDER — ATORVASTATIN CALCIUM 40 MG PO TABS: 40.0000 mg | ORAL_TABLET | Freq: Every day | ORAL | 11 refills | Status: DC

## 2021-07-23 MED ORDER — AMIODARONE HCL 200 MG PO TABS: 100.0000 mg | ORAL_TABLET | Freq: Every day | ORAL | 6 refills | Status: DC

## 2021-07-23 NOTE — Chronic Care Management (AMB) (Signed)
?  Chronic Care Management  ? ?Outreach Note ? ?07/23/2021 ?Name: [name] MRN: [id number] DOB: 09/30/1945 ? ?Covey Junior Balfour is a 76 y.o. year old male who is a primary care patient of Pahwani, Michell Heinrich, MD. I reached out to [employer] by phone today in response to a referral sent by Mr. Herbie Baltimore Junior Harr's primary care provider. ? ?An unsuccessful telephone outreach was attempted today. The patient was referred to the case management team for assistance with care management and care coordination.  ? ?Follow Up Plan: A HIPAA compliant phone message was left for the patient providing contact information and requesting a return call.  ?The care management team will reach out to the patient again over the next 7 days.  ?If patient returns call to provider office, please advise to call [facility]* at [phone].* ? ?Laverda Sorenson  ?Care Guide, Embedded Care Coordination ?[place]  Care Management  ?Direct Dial: [phone] ? ?

## 2021-07-23 NOTE — Patient Instructions (Signed)
DECREASE Amiodarone to 100 mg, one half tab daily ?DECREASE Atorvastatin  to 40 mg one tab daily ? ?Labs today ?We will only contact you if something comes back abnormal or we need to make some changes. ?Otherwise no news is good news! ? ?Your physician recommends that you schedule a follow-up appointment in: 6 weeks with Dr Aundra Dubin ? ? ?Do the following things EVERYDAY: ?Weigh yourself in the morning before breakfast. Write it down and keep it in a log. ?Take your medicines as prescribed ?Eat low salt foods--Limit salt (sodium) to 2000 mg per day.  ?Stay as active as you can everyday ?Limit all fluids for the day to less than 2 liters ? ?At the [facility], you and your health needs are our priority. As part of our continuing mission to provide you with exceptional heart care, we have created designated Provider Care Teams. These Care Teams include your primary Cardiologist (physician) and Advanced Practice Providers (APPs- Physician Assistants and Nurse Practitioners) who all work together to provide you with the care you need, when you need it.  ? ?You may see any of the following providers on your designated Care Team at your next follow up: ?Dr Glori Bickers ?Dr Loralie Champagne ?Darrick Grinder, NP ?Lyda Jester, PA ?Jessica Milford,NP ?Marlyce Huge, PA ?Audry Riles, PharmD ? ? ?Please be sure to bring in all your medications bottles to every appointment.  ? ?If you have any questions or concerns before your next appointment please send [place] a message through [place] or call our office at [phone].   ? ?TO LEAVE A MESSAGE FOR THE NURSE SELECT OPTION 2, PLEASE LEAVE A MESSAGE INCLUDING: ?YOUR NAME ?DATE OF BIRTH ?CALL BACK NUMBER ?REASON FOR CALL**this is important as we prioritize the call backs ? ?YOU WILL RECEIVE A CALL BACK THE SAME DAY AS LONG AS YOU CALL BEFORE 4:00 PM ? ? ?

## 2021-07-24 DIAGNOSIS — D689 Coagulation defect, unspecified: Secondary | ICD-10-CM | POA: Diagnosis not present

## 2021-07-24 DIAGNOSIS — D509 Iron deficiency anemia, unspecified: Secondary | ICD-10-CM | POA: Diagnosis not present

## 2021-07-24 DIAGNOSIS — D631 Anemia in chronic kidney disease: Secondary | ICD-10-CM | POA: Diagnosis not present

## 2021-07-24 DIAGNOSIS — Z992 Dependence on renal dialysis: Secondary | ICD-10-CM | POA: Diagnosis not present

## 2021-07-24 DIAGNOSIS — N186 End stage renal disease: Secondary | ICD-10-CM | POA: Diagnosis not present

## 2021-07-24 DIAGNOSIS — N2581 Secondary hyperparathyroidism of renal origin: Secondary | ICD-10-CM | POA: Diagnosis not present

## 2021-07-24 NOTE — Chronic Care Management (AMB) (Signed)
Patient has new PCP Dr. Doristine Bosworth with [facility]. Will close referral for Embedded Team with [facility]. ? ?Laverda Sorenson  ?Care Guide, Embedded Care Coordination ?[facility]  Care Management  ?Direct Dial: [phone] ? ?

## 2021-07-25 DIAGNOSIS — Z7901 Long term (current) use of anticoagulants: Secondary | ICD-10-CM | POA: Diagnosis not present

## 2021-07-25 DIAGNOSIS — I2510 Atherosclerotic heart disease of native coronary artery without angina pectoris: Secondary | ICD-10-CM | POA: Diagnosis not present

## 2021-07-25 DIAGNOSIS — F32A Depression, unspecified: Secondary | ICD-10-CM | POA: Diagnosis not present

## 2021-07-25 DIAGNOSIS — Z681 Body mass index (BMI) 19 or less, adult: Secondary | ICD-10-CM | POA: Diagnosis not present

## 2021-07-25 DIAGNOSIS — L89152 Pressure ulcer of sacral region, stage 2: Secondary | ICD-10-CM | POA: Diagnosis not present

## 2021-07-25 DIAGNOSIS — I252 Old myocardial infarction: Secondary | ICD-10-CM | POA: Diagnosis not present

## 2021-07-25 DIAGNOSIS — K769 Liver disease, unspecified: Secondary | ICD-10-CM | POA: Diagnosis not present

## 2021-07-25 DIAGNOSIS — I132 Hypertensive heart and chronic kidney disease with heart failure and with stage 5 chronic kidney disease, or end stage renal disease: Secondary | ICD-10-CM | POA: Diagnosis not present

## 2021-07-25 DIAGNOSIS — I480 Paroxysmal atrial fibrillation: Secondary | ICD-10-CM | POA: Diagnosis not present

## 2021-07-25 DIAGNOSIS — Z720 Tobacco use: Secondary | ICD-10-CM | POA: Diagnosis not present

## 2021-07-25 DIAGNOSIS — I5022 Chronic systolic (congestive) heart failure: Secondary | ICD-10-CM | POA: Diagnosis not present

## 2021-07-25 DIAGNOSIS — J9611 Chronic respiratory failure with hypoxia: Secondary | ICD-10-CM | POA: Diagnosis not present

## 2021-07-25 DIAGNOSIS — K5641 Fecal impaction: Secondary | ICD-10-CM | POA: Diagnosis not present

## 2021-07-25 DIAGNOSIS — K5900 Constipation, unspecified: Secondary | ICD-10-CM | POA: Diagnosis not present

## 2021-07-25 DIAGNOSIS — R634 Abnormal weight loss: Secondary | ICD-10-CM | POA: Diagnosis not present

## 2021-07-25 DIAGNOSIS — E785 Hyperlipidemia, unspecified: Secondary | ICD-10-CM | POA: Diagnosis not present

## 2021-07-25 DIAGNOSIS — I5030 Unspecified diastolic (congestive) heart failure: Secondary | ICD-10-CM | POA: Diagnosis not present

## 2021-07-25 DIAGNOSIS — M1990 Unspecified osteoarthritis, unspecified site: Secondary | ICD-10-CM | POA: Diagnosis not present

## 2021-07-25 DIAGNOSIS — R1310 Dysphagia, unspecified: Secondary | ICD-10-CM | POA: Diagnosis not present

## 2021-07-25 DIAGNOSIS — D631 Anemia in chronic kidney disease: Secondary | ICD-10-CM | POA: Diagnosis not present

## 2021-07-25 DIAGNOSIS — N186 End stage renal disease: Secondary | ICD-10-CM | POA: Diagnosis not present

## 2021-07-25 DIAGNOSIS — E43 Unspecified severe protein-calorie malnutrition: Secondary | ICD-10-CM | POA: Diagnosis not present

## 2021-07-26 DIAGNOSIS — D509 Iron deficiency anemia, unspecified: Secondary | ICD-10-CM | POA: Diagnosis not present

## 2021-07-26 DIAGNOSIS — D631 Anemia in chronic kidney disease: Secondary | ICD-10-CM | POA: Diagnosis not present

## 2021-07-26 DIAGNOSIS — N2581 Secondary hyperparathyroidism of renal origin: Secondary | ICD-10-CM | POA: Diagnosis not present

## 2021-07-26 DIAGNOSIS — I5021 Acute systolic (congestive) heart failure: Secondary | ICD-10-CM | POA: Diagnosis not present

## 2021-07-26 DIAGNOSIS — E43 Unspecified severe protein-calorie malnutrition: Secondary | ICD-10-CM | POA: Diagnosis not present

## 2021-07-26 DIAGNOSIS — N186 End stage renal disease: Secondary | ICD-10-CM | POA: Diagnosis not present

## 2021-07-26 DIAGNOSIS — Z992 Dependence on renal dialysis: Secondary | ICD-10-CM | POA: Diagnosis not present

## 2021-07-26 DIAGNOSIS — D689 Coagulation defect, unspecified: Secondary | ICD-10-CM | POA: Diagnosis not present

## 2021-07-29 DIAGNOSIS — Z992 Dependence on renal dialysis: Secondary | ICD-10-CM | POA: Diagnosis not present

## 2021-07-29 DIAGNOSIS — E43 Unspecified severe protein-calorie malnutrition: Secondary | ICD-10-CM | POA: Diagnosis not present

## 2021-07-29 DIAGNOSIS — I5021 Acute systolic (congestive) heart failure: Secondary | ICD-10-CM | POA: Diagnosis not present

## 2021-07-29 DIAGNOSIS — N2581 Secondary hyperparathyroidism of renal origin: Secondary | ICD-10-CM | POA: Diagnosis not present

## 2021-07-29 DIAGNOSIS — D689 Coagulation defect, unspecified: Secondary | ICD-10-CM | POA: Diagnosis not present

## 2021-07-29 DIAGNOSIS — N186 End stage renal disease: Secondary | ICD-10-CM | POA: Diagnosis not present

## 2021-07-29 DIAGNOSIS — D509 Iron deficiency anemia, unspecified: Secondary | ICD-10-CM | POA: Diagnosis not present

## 2021-07-29 DIAGNOSIS — D631 Anemia in chronic kidney disease: Secondary | ICD-10-CM | POA: Diagnosis not present

## 2021-07-30 ENCOUNTER — Telehealth: Payer: Self-pay | Admitting: Internal Medicine

## 2021-07-30 NOTE — Telephone Encounter (Signed)
I spoke with patients daughter [name] to schedule awv.  She stated patient changed pcp and goes to [facility] now ?

## 2021-07-31 DIAGNOSIS — I5022 Chronic systolic (congestive) heart failure: Secondary | ICD-10-CM | POA: Diagnosis not present

## 2021-07-31 DIAGNOSIS — N186 End stage renal disease: Secondary | ICD-10-CM | POA: Diagnosis not present

## 2021-07-31 DIAGNOSIS — N2581 Secondary hyperparathyroidism of renal origin: Secondary | ICD-10-CM | POA: Diagnosis not present

## 2021-07-31 DIAGNOSIS — R634 Abnormal weight loss: Secondary | ICD-10-CM | POA: Diagnosis not present

## 2021-07-31 DIAGNOSIS — K5900 Constipation, unspecified: Secondary | ICD-10-CM | POA: Diagnosis not present

## 2021-07-31 DIAGNOSIS — Z992 Dependence on renal dialysis: Secondary | ICD-10-CM | POA: Diagnosis not present

## 2021-07-31 DIAGNOSIS — D689 Coagulation defect, unspecified: Secondary | ICD-10-CM | POA: Diagnosis not present

## 2021-07-31 DIAGNOSIS — K769 Liver disease, unspecified: Secondary | ICD-10-CM | POA: Diagnosis not present

## 2021-07-31 DIAGNOSIS — E785 Hyperlipidemia, unspecified: Secondary | ICD-10-CM | POA: Diagnosis not present

## 2021-07-31 DIAGNOSIS — I2510 Atherosclerotic heart disease of native coronary artery without angina pectoris: Secondary | ICD-10-CM | POA: Diagnosis not present

## 2021-07-31 DIAGNOSIS — J9611 Chronic respiratory failure with hypoxia: Secondary | ICD-10-CM | POA: Diagnosis not present

## 2021-07-31 DIAGNOSIS — I132 Hypertensive heart and chronic kidney disease with heart failure and with stage 5 chronic kidney disease, or end stage renal disease: Secondary | ICD-10-CM | POA: Diagnosis not present

## 2021-07-31 DIAGNOSIS — L89152 Pressure ulcer of sacral region, stage 2: Secondary | ICD-10-CM | POA: Diagnosis not present

## 2021-07-31 DIAGNOSIS — R1310 Dysphagia, unspecified: Secondary | ICD-10-CM | POA: Diagnosis not present

## 2021-07-31 DIAGNOSIS — Z720 Tobacco use: Secondary | ICD-10-CM | POA: Diagnosis not present

## 2021-07-31 DIAGNOSIS — I5030 Unspecified diastolic (congestive) heart failure: Secondary | ICD-10-CM | POA: Diagnosis not present

## 2021-07-31 DIAGNOSIS — D649 Anemia, unspecified: Secondary | ICD-10-CM | POA: Diagnosis not present

## 2021-07-31 DIAGNOSIS — K5641 Fecal impaction: Secondary | ICD-10-CM | POA: Diagnosis not present

## 2021-07-31 DIAGNOSIS — I252 Old myocardial infarction: Secondary | ICD-10-CM | POA: Diagnosis not present

## 2021-07-31 DIAGNOSIS — M1990 Unspecified osteoarthritis, unspecified site: Secondary | ICD-10-CM | POA: Diagnosis not present

## 2021-07-31 DIAGNOSIS — Z681 Body mass index (BMI) 19 or less, adult: Secondary | ICD-10-CM | POA: Diagnosis not present

## 2021-07-31 DIAGNOSIS — E43 Unspecified severe protein-calorie malnutrition: Secondary | ICD-10-CM | POA: Diagnosis not present

## 2021-07-31 DIAGNOSIS — F32A Depression, unspecified: Secondary | ICD-10-CM | POA: Diagnosis not present

## 2021-07-31 DIAGNOSIS — D631 Anemia in chronic kidney disease: Secondary | ICD-10-CM | POA: Diagnosis not present

## 2021-07-31 DIAGNOSIS — Z7901 Long term (current) use of anticoagulants: Secondary | ICD-10-CM | POA: Diagnosis not present

## 2021-07-31 DIAGNOSIS — I480 Paroxysmal atrial fibrillation: Secondary | ICD-10-CM | POA: Diagnosis not present

## 2021-08-01 DIAGNOSIS — E43 Unspecified severe protein-calorie malnutrition: Secondary | ICD-10-CM | POA: Diagnosis not present

## 2021-08-01 DIAGNOSIS — I2510 Atherosclerotic heart disease of native coronary artery without angina pectoris: Secondary | ICD-10-CM | POA: Diagnosis not present

## 2021-08-01 DIAGNOSIS — Z681 Body mass index (BMI) 19 or less, adult: Secondary | ICD-10-CM | POA: Diagnosis not present

## 2021-08-01 DIAGNOSIS — N186 End stage renal disease: Secondary | ICD-10-CM | POA: Diagnosis not present

## 2021-08-01 DIAGNOSIS — K5900 Constipation, unspecified: Secondary | ICD-10-CM | POA: Diagnosis not present

## 2021-08-01 DIAGNOSIS — R1310 Dysphagia, unspecified: Secondary | ICD-10-CM | POA: Diagnosis not present

## 2021-08-01 DIAGNOSIS — F32A Depression, unspecified: Secondary | ICD-10-CM | POA: Diagnosis not present

## 2021-08-01 DIAGNOSIS — I5022 Chronic systolic (congestive) heart failure: Secondary | ICD-10-CM | POA: Diagnosis not present

## 2021-08-01 DIAGNOSIS — M1990 Unspecified osteoarthritis, unspecified site: Secondary | ICD-10-CM | POA: Diagnosis not present

## 2021-08-01 DIAGNOSIS — J9611 Chronic respiratory failure with hypoxia: Secondary | ICD-10-CM | POA: Diagnosis not present

## 2021-08-01 DIAGNOSIS — Z720 Tobacco use: Secondary | ICD-10-CM | POA: Diagnosis not present

## 2021-08-01 DIAGNOSIS — I132 Hypertensive heart and chronic kidney disease with heart failure and with stage 5 chronic kidney disease, or end stage renal disease: Secondary | ICD-10-CM | POA: Diagnosis not present

## 2021-08-01 DIAGNOSIS — E785 Hyperlipidemia, unspecified: Secondary | ICD-10-CM | POA: Diagnosis not present

## 2021-08-01 DIAGNOSIS — I252 Old myocardial infarction: Secondary | ICD-10-CM | POA: Diagnosis not present

## 2021-08-01 DIAGNOSIS — K769 Liver disease, unspecified: Secondary | ICD-10-CM | POA: Diagnosis not present

## 2021-08-01 DIAGNOSIS — L89152 Pressure ulcer of sacral region, stage 2: Secondary | ICD-10-CM | POA: Diagnosis not present

## 2021-08-01 DIAGNOSIS — D631 Anemia in chronic kidney disease: Secondary | ICD-10-CM | POA: Diagnosis not present

## 2021-08-01 DIAGNOSIS — R634 Abnormal weight loss: Secondary | ICD-10-CM | POA: Diagnosis not present

## 2021-08-01 DIAGNOSIS — K5641 Fecal impaction: Secondary | ICD-10-CM | POA: Diagnosis not present

## 2021-08-01 DIAGNOSIS — I5030 Unspecified diastolic (congestive) heart failure: Secondary | ICD-10-CM | POA: Diagnosis not present

## 2021-08-01 DIAGNOSIS — I480 Paroxysmal atrial fibrillation: Secondary | ICD-10-CM | POA: Diagnosis not present

## 2021-08-01 DIAGNOSIS — Z7901 Long term (current) use of anticoagulants: Secondary | ICD-10-CM | POA: Diagnosis not present

## 2021-08-02 DIAGNOSIS — D649 Anemia, unspecified: Secondary | ICD-10-CM | POA: Diagnosis not present

## 2021-08-02 DIAGNOSIS — N179 Acute kidney failure, unspecified: Secondary | ICD-10-CM | POA: Diagnosis not present

## 2021-08-02 DIAGNOSIS — Z992 Dependence on renal dialysis: Secondary | ICD-10-CM | POA: Diagnosis not present

## 2021-08-02 DIAGNOSIS — D689 Coagulation defect, unspecified: Secondary | ICD-10-CM | POA: Diagnosis not present

## 2021-08-02 DIAGNOSIS — N2581 Secondary hyperparathyroidism of renal origin: Secondary | ICD-10-CM | POA: Diagnosis not present

## 2021-08-02 DIAGNOSIS — N186 End stage renal disease: Secondary | ICD-10-CM | POA: Diagnosis not present

## 2021-08-05 DIAGNOSIS — N2581 Secondary hyperparathyroidism of renal origin: Secondary | ICD-10-CM | POA: Diagnosis not present

## 2021-08-05 DIAGNOSIS — D689 Coagulation defect, unspecified: Secondary | ICD-10-CM | POA: Diagnosis not present

## 2021-08-05 DIAGNOSIS — Z992 Dependence on renal dialysis: Secondary | ICD-10-CM | POA: Diagnosis not present

## 2021-08-05 DIAGNOSIS — D509 Iron deficiency anemia, unspecified: Secondary | ICD-10-CM | POA: Diagnosis not present

## 2021-08-05 DIAGNOSIS — F32A Depression, unspecified: Secondary | ICD-10-CM | POA: Diagnosis not present

## 2021-08-05 DIAGNOSIS — R319 Hematuria, unspecified: Secondary | ICD-10-CM | POA: Diagnosis not present

## 2021-08-05 DIAGNOSIS — N186 End stage renal disease: Secondary | ICD-10-CM | POA: Diagnosis not present

## 2021-08-05 DIAGNOSIS — D649 Anemia, unspecified: Secondary | ICD-10-CM | POA: Diagnosis not present

## 2021-08-06 DIAGNOSIS — I252 Old myocardial infarction: Secondary | ICD-10-CM | POA: Diagnosis not present

## 2021-08-06 DIAGNOSIS — Z681 Body mass index (BMI) 19 or less, adult: Secondary | ICD-10-CM | POA: Diagnosis not present

## 2021-08-06 DIAGNOSIS — I5022 Chronic systolic (congestive) heart failure: Secondary | ICD-10-CM | POA: Diagnosis not present

## 2021-08-06 DIAGNOSIS — M1990 Unspecified osteoarthritis, unspecified site: Secondary | ICD-10-CM | POA: Diagnosis not present

## 2021-08-06 DIAGNOSIS — L89152 Pressure ulcer of sacral region, stage 2: Secondary | ICD-10-CM | POA: Diagnosis not present

## 2021-08-06 DIAGNOSIS — D631 Anemia in chronic kidney disease: Secondary | ICD-10-CM | POA: Diagnosis not present

## 2021-08-06 DIAGNOSIS — K769 Liver disease, unspecified: Secondary | ICD-10-CM | POA: Diagnosis not present

## 2021-08-06 DIAGNOSIS — J9611 Chronic respiratory failure with hypoxia: Secondary | ICD-10-CM | POA: Diagnosis not present

## 2021-08-06 DIAGNOSIS — E785 Hyperlipidemia, unspecified: Secondary | ICD-10-CM | POA: Diagnosis not present

## 2021-08-06 DIAGNOSIS — Z7901 Long term (current) use of anticoagulants: Secondary | ICD-10-CM | POA: Diagnosis not present

## 2021-08-06 DIAGNOSIS — I2510 Atherosclerotic heart disease of native coronary artery without angina pectoris: Secondary | ICD-10-CM | POA: Diagnosis not present

## 2021-08-06 DIAGNOSIS — R1310 Dysphagia, unspecified: Secondary | ICD-10-CM | POA: Diagnosis not present

## 2021-08-06 DIAGNOSIS — F32A Depression, unspecified: Secondary | ICD-10-CM | POA: Diagnosis not present

## 2021-08-06 DIAGNOSIS — Z720 Tobacco use: Secondary | ICD-10-CM | POA: Diagnosis not present

## 2021-08-06 DIAGNOSIS — R634 Abnormal weight loss: Secondary | ICD-10-CM | POA: Diagnosis not present

## 2021-08-06 DIAGNOSIS — E43 Unspecified severe protein-calorie malnutrition: Secondary | ICD-10-CM | POA: Diagnosis not present

## 2021-08-06 DIAGNOSIS — K5641 Fecal impaction: Secondary | ICD-10-CM | POA: Diagnosis not present

## 2021-08-06 DIAGNOSIS — I5030 Unspecified diastolic (congestive) heart failure: Secondary | ICD-10-CM | POA: Diagnosis not present

## 2021-08-06 DIAGNOSIS — K5900 Constipation, unspecified: Secondary | ICD-10-CM | POA: Diagnosis not present

## 2021-08-06 DIAGNOSIS — N186 End stage renal disease: Secondary | ICD-10-CM | POA: Diagnosis not present

## 2021-08-06 DIAGNOSIS — I480 Paroxysmal atrial fibrillation: Secondary | ICD-10-CM | POA: Diagnosis not present

## 2021-08-06 DIAGNOSIS — I132 Hypertensive heart and chronic kidney disease with heart failure and with stage 5 chronic kidney disease, or end stage renal disease: Secondary | ICD-10-CM | POA: Diagnosis not present

## 2021-08-07 DIAGNOSIS — N186 End stage renal disease: Secondary | ICD-10-CM | POA: Diagnosis not present

## 2021-08-07 DIAGNOSIS — D689 Coagulation defect, unspecified: Secondary | ICD-10-CM | POA: Diagnosis not present

## 2021-08-07 DIAGNOSIS — D649 Anemia, unspecified: Secondary | ICD-10-CM | POA: Diagnosis not present

## 2021-08-07 DIAGNOSIS — Z992 Dependence on renal dialysis: Secondary | ICD-10-CM | POA: Diagnosis not present

## 2021-08-07 DIAGNOSIS — D509 Iron deficiency anemia, unspecified: Secondary | ICD-10-CM | POA: Diagnosis not present

## 2021-08-07 DIAGNOSIS — R319 Hematuria, unspecified: Secondary | ICD-10-CM | POA: Diagnosis not present

## 2021-08-07 DIAGNOSIS — N2581 Secondary hyperparathyroidism of renal origin: Secondary | ICD-10-CM | POA: Diagnosis not present

## 2021-08-08 DIAGNOSIS — K5641 Fecal impaction: Secondary | ICD-10-CM | POA: Diagnosis not present

## 2021-08-08 DIAGNOSIS — Z720 Tobacco use: Secondary | ICD-10-CM | POA: Diagnosis not present

## 2021-08-08 DIAGNOSIS — M1990 Unspecified osteoarthritis, unspecified site: Secondary | ICD-10-CM | POA: Diagnosis not present

## 2021-08-08 DIAGNOSIS — D631 Anemia in chronic kidney disease: Secondary | ICD-10-CM | POA: Diagnosis not present

## 2021-08-08 DIAGNOSIS — F32A Depression, unspecified: Secondary | ICD-10-CM | POA: Diagnosis not present

## 2021-08-08 DIAGNOSIS — K769 Liver disease, unspecified: Secondary | ICD-10-CM | POA: Diagnosis not present

## 2021-08-08 DIAGNOSIS — I480 Paroxysmal atrial fibrillation: Secondary | ICD-10-CM | POA: Diagnosis not present

## 2021-08-08 DIAGNOSIS — I252 Old myocardial infarction: Secondary | ICD-10-CM | POA: Diagnosis not present

## 2021-08-08 DIAGNOSIS — R1310 Dysphagia, unspecified: Secondary | ICD-10-CM | POA: Diagnosis not present

## 2021-08-08 DIAGNOSIS — L89152 Pressure ulcer of sacral region, stage 2: Secondary | ICD-10-CM | POA: Diagnosis not present

## 2021-08-08 DIAGNOSIS — I5022 Chronic systolic (congestive) heart failure: Secondary | ICD-10-CM | POA: Diagnosis not present

## 2021-08-08 DIAGNOSIS — I5030 Unspecified diastolic (congestive) heart failure: Secondary | ICD-10-CM | POA: Diagnosis not present

## 2021-08-08 DIAGNOSIS — E43 Unspecified severe protein-calorie malnutrition: Secondary | ICD-10-CM | POA: Diagnosis not present

## 2021-08-08 DIAGNOSIS — R634 Abnormal weight loss: Secondary | ICD-10-CM | POA: Diagnosis not present

## 2021-08-08 DIAGNOSIS — Z681 Body mass index (BMI) 19 or less, adult: Secondary | ICD-10-CM | POA: Diagnosis not present

## 2021-08-08 DIAGNOSIS — E785 Hyperlipidemia, unspecified: Secondary | ICD-10-CM | POA: Diagnosis not present

## 2021-08-08 DIAGNOSIS — Z7901 Long term (current) use of anticoagulants: Secondary | ICD-10-CM | POA: Diagnosis not present

## 2021-08-08 DIAGNOSIS — J9611 Chronic respiratory failure with hypoxia: Secondary | ICD-10-CM | POA: Diagnosis not present

## 2021-08-08 DIAGNOSIS — I2510 Atherosclerotic heart disease of native coronary artery without angina pectoris: Secondary | ICD-10-CM | POA: Diagnosis not present

## 2021-08-08 DIAGNOSIS — I132 Hypertensive heart and chronic kidney disease with heart failure and with stage 5 chronic kidney disease, or end stage renal disease: Secondary | ICD-10-CM | POA: Diagnosis not present

## 2021-08-08 DIAGNOSIS — N186 End stage renal disease: Secondary | ICD-10-CM | POA: Diagnosis not present

## 2021-08-08 DIAGNOSIS — K5900 Constipation, unspecified: Secondary | ICD-10-CM | POA: Diagnosis not present

## 2021-08-09 DIAGNOSIS — D509 Iron deficiency anemia, unspecified: Secondary | ICD-10-CM | POA: Diagnosis not present

## 2021-08-09 DIAGNOSIS — N186 End stage renal disease: Secondary | ICD-10-CM | POA: Diagnosis not present

## 2021-08-09 DIAGNOSIS — D649 Anemia, unspecified: Secondary | ICD-10-CM | POA: Diagnosis not present

## 2021-08-09 DIAGNOSIS — N2581 Secondary hyperparathyroidism of renal origin: Secondary | ICD-10-CM | POA: Diagnosis not present

## 2021-08-09 DIAGNOSIS — D689 Coagulation defect, unspecified: Secondary | ICD-10-CM | POA: Diagnosis not present

## 2021-08-09 DIAGNOSIS — Z992 Dependence on renal dialysis: Secondary | ICD-10-CM | POA: Diagnosis not present

## 2021-08-12 DIAGNOSIS — D509 Iron deficiency anemia, unspecified: Secondary | ICD-10-CM | POA: Diagnosis not present

## 2021-08-12 DIAGNOSIS — Z992 Dependence on renal dialysis: Secondary | ICD-10-CM | POA: Diagnosis not present

## 2021-08-12 DIAGNOSIS — D649 Anemia, unspecified: Secondary | ICD-10-CM | POA: Diagnosis not present

## 2021-08-12 DIAGNOSIS — N186 End stage renal disease: Secondary | ICD-10-CM | POA: Diagnosis not present

## 2021-08-12 DIAGNOSIS — N2581 Secondary hyperparathyroidism of renal origin: Secondary | ICD-10-CM | POA: Diagnosis not present

## 2021-08-12 DIAGNOSIS — D689 Coagulation defect, unspecified: Secondary | ICD-10-CM | POA: Diagnosis not present

## 2021-08-13 DIAGNOSIS — I132 Hypertensive heart and chronic kidney disease with heart failure and with stage 5 chronic kidney disease, or end stage renal disease: Secondary | ICD-10-CM | POA: Diagnosis not present

## 2021-08-13 DIAGNOSIS — R634 Abnormal weight loss: Secondary | ICD-10-CM | POA: Diagnosis not present

## 2021-08-13 DIAGNOSIS — Z720 Tobacco use: Secondary | ICD-10-CM | POA: Diagnosis not present

## 2021-08-13 DIAGNOSIS — K5641 Fecal impaction: Secondary | ICD-10-CM | POA: Diagnosis not present

## 2021-08-13 DIAGNOSIS — K769 Liver disease, unspecified: Secondary | ICD-10-CM | POA: Diagnosis not present

## 2021-08-13 DIAGNOSIS — N186 End stage renal disease: Secondary | ICD-10-CM | POA: Diagnosis not present

## 2021-08-13 DIAGNOSIS — I2510 Atherosclerotic heart disease of native coronary artery without angina pectoris: Secondary | ICD-10-CM | POA: Diagnosis not present

## 2021-08-13 DIAGNOSIS — E43 Unspecified severe protein-calorie malnutrition: Secondary | ICD-10-CM | POA: Diagnosis not present

## 2021-08-13 DIAGNOSIS — M1990 Unspecified osteoarthritis, unspecified site: Secondary | ICD-10-CM | POA: Diagnosis not present

## 2021-08-13 DIAGNOSIS — Z7901 Long term (current) use of anticoagulants: Secondary | ICD-10-CM | POA: Diagnosis not present

## 2021-08-13 DIAGNOSIS — Z681 Body mass index (BMI) 19 or less, adult: Secondary | ICD-10-CM | POA: Diagnosis not present

## 2021-08-13 DIAGNOSIS — I5030 Unspecified diastolic (congestive) heart failure: Secondary | ICD-10-CM | POA: Diagnosis not present

## 2021-08-13 DIAGNOSIS — I480 Paroxysmal atrial fibrillation: Secondary | ICD-10-CM | POA: Diagnosis not present

## 2021-08-13 DIAGNOSIS — K5900 Constipation, unspecified: Secondary | ICD-10-CM | POA: Diagnosis not present

## 2021-08-13 DIAGNOSIS — I5022 Chronic systolic (congestive) heart failure: Secondary | ICD-10-CM | POA: Diagnosis not present

## 2021-08-13 DIAGNOSIS — R1310 Dysphagia, unspecified: Secondary | ICD-10-CM | POA: Diagnosis not present

## 2021-08-13 DIAGNOSIS — F32A Depression, unspecified: Secondary | ICD-10-CM | POA: Diagnosis not present

## 2021-08-13 DIAGNOSIS — J9611 Chronic respiratory failure with hypoxia: Secondary | ICD-10-CM | POA: Diagnosis not present

## 2021-08-13 DIAGNOSIS — L89152 Pressure ulcer of sacral region, stage 2: Secondary | ICD-10-CM | POA: Diagnosis not present

## 2021-08-13 DIAGNOSIS — D631 Anemia in chronic kidney disease: Secondary | ICD-10-CM | POA: Diagnosis not present

## 2021-08-13 DIAGNOSIS — I252 Old myocardial infarction: Secondary | ICD-10-CM | POA: Diagnosis not present

## 2021-08-13 DIAGNOSIS — E785 Hyperlipidemia, unspecified: Secondary | ICD-10-CM | POA: Diagnosis not present

## 2021-08-14 DIAGNOSIS — D509 Iron deficiency anemia, unspecified: Secondary | ICD-10-CM | POA: Diagnosis not present

## 2021-08-14 DIAGNOSIS — Z992 Dependence on renal dialysis: Secondary | ICD-10-CM | POA: Diagnosis not present

## 2021-08-14 DIAGNOSIS — N2581 Secondary hyperparathyroidism of renal origin: Secondary | ICD-10-CM | POA: Diagnosis not present

## 2021-08-14 DIAGNOSIS — N186 End stage renal disease: Secondary | ICD-10-CM | POA: Diagnosis not present

## 2021-08-14 DIAGNOSIS — D689 Coagulation defect, unspecified: Secondary | ICD-10-CM | POA: Diagnosis not present

## 2021-08-14 DIAGNOSIS — D649 Anemia, unspecified: Secondary | ICD-10-CM | POA: Diagnosis not present

## 2021-08-16 DIAGNOSIS — Z992 Dependence on renal dialysis: Secondary | ICD-10-CM | POA: Diagnosis not present

## 2021-08-16 DIAGNOSIS — N186 End stage renal disease: Secondary | ICD-10-CM | POA: Diagnosis not present

## 2021-08-16 DIAGNOSIS — D689 Coagulation defect, unspecified: Secondary | ICD-10-CM | POA: Diagnosis not present

## 2021-08-16 DIAGNOSIS — N2581 Secondary hyperparathyroidism of renal origin: Secondary | ICD-10-CM | POA: Diagnosis not present

## 2021-08-16 DIAGNOSIS — D649 Anemia, unspecified: Secondary | ICD-10-CM | POA: Diagnosis not present

## 2021-08-16 DIAGNOSIS — D509 Iron deficiency anemia, unspecified: Secondary | ICD-10-CM | POA: Diagnosis not present

## 2021-08-19 DIAGNOSIS — Z992 Dependence on renal dialysis: Secondary | ICD-10-CM | POA: Diagnosis not present

## 2021-08-19 DIAGNOSIS — D689 Coagulation defect, unspecified: Secondary | ICD-10-CM | POA: Diagnosis not present

## 2021-08-19 DIAGNOSIS — N2581 Secondary hyperparathyroidism of renal origin: Secondary | ICD-10-CM | POA: Diagnosis not present

## 2021-08-19 DIAGNOSIS — D509 Iron deficiency anemia, unspecified: Secondary | ICD-10-CM | POA: Diagnosis not present

## 2021-08-19 DIAGNOSIS — N186 End stage renal disease: Secondary | ICD-10-CM | POA: Diagnosis not present

## 2021-08-19 DIAGNOSIS — D649 Anemia, unspecified: Secondary | ICD-10-CM | POA: Diagnosis not present

## 2021-08-20 DIAGNOSIS — K769 Liver disease, unspecified: Secondary | ICD-10-CM | POA: Diagnosis not present

## 2021-08-20 DIAGNOSIS — K5641 Fecal impaction: Secondary | ICD-10-CM | POA: Diagnosis not present

## 2021-08-20 DIAGNOSIS — F32A Depression, unspecified: Secondary | ICD-10-CM | POA: Diagnosis not present

## 2021-08-20 DIAGNOSIS — E785 Hyperlipidemia, unspecified: Secondary | ICD-10-CM | POA: Diagnosis not present

## 2021-08-20 DIAGNOSIS — I480 Paroxysmal atrial fibrillation: Secondary | ICD-10-CM | POA: Diagnosis not present

## 2021-08-20 DIAGNOSIS — I5030 Unspecified diastolic (congestive) heart failure: Secondary | ICD-10-CM | POA: Diagnosis not present

## 2021-08-20 DIAGNOSIS — I2510 Atherosclerotic heart disease of native coronary artery without angina pectoris: Secondary | ICD-10-CM | POA: Diagnosis not present

## 2021-08-20 DIAGNOSIS — I5022 Chronic systolic (congestive) heart failure: Secondary | ICD-10-CM | POA: Diagnosis not present

## 2021-08-20 DIAGNOSIS — L89152 Pressure ulcer of sacral region, stage 2: Secondary | ICD-10-CM | POA: Diagnosis not present

## 2021-08-20 DIAGNOSIS — E43 Unspecified severe protein-calorie malnutrition: Secondary | ICD-10-CM | POA: Diagnosis not present

## 2021-08-20 DIAGNOSIS — I252 Old myocardial infarction: Secondary | ICD-10-CM | POA: Diagnosis not present

## 2021-08-20 DIAGNOSIS — D631 Anemia in chronic kidney disease: Secondary | ICD-10-CM | POA: Diagnosis not present

## 2021-08-20 DIAGNOSIS — J9611 Chronic respiratory failure with hypoxia: Secondary | ICD-10-CM | POA: Diagnosis not present

## 2021-08-20 DIAGNOSIS — M1990 Unspecified osteoarthritis, unspecified site: Secondary | ICD-10-CM | POA: Diagnosis not present

## 2021-08-20 DIAGNOSIS — N186 End stage renal disease: Secondary | ICD-10-CM | POA: Diagnosis not present

## 2021-08-20 DIAGNOSIS — I132 Hypertensive heart and chronic kidney disease with heart failure and with stage 5 chronic kidney disease, or end stage renal disease: Secondary | ICD-10-CM | POA: Diagnosis not present

## 2021-08-20 DIAGNOSIS — Z7901 Long term (current) use of anticoagulants: Secondary | ICD-10-CM | POA: Diagnosis not present

## 2021-08-20 DIAGNOSIS — K5900 Constipation, unspecified: Secondary | ICD-10-CM | POA: Diagnosis not present

## 2021-08-20 DIAGNOSIS — Z681 Body mass index (BMI) 19 or less, adult: Secondary | ICD-10-CM | POA: Diagnosis not present

## 2021-08-20 DIAGNOSIS — R634 Abnormal weight loss: Secondary | ICD-10-CM | POA: Diagnosis not present

## 2021-08-20 DIAGNOSIS — R1310 Dysphagia, unspecified: Secondary | ICD-10-CM | POA: Diagnosis not present

## 2021-08-20 DIAGNOSIS — Z720 Tobacco use: Secondary | ICD-10-CM | POA: Diagnosis not present

## 2021-08-21 DIAGNOSIS — D509 Iron deficiency anemia, unspecified: Secondary | ICD-10-CM | POA: Diagnosis not present

## 2021-08-21 DIAGNOSIS — D689 Coagulation defect, unspecified: Secondary | ICD-10-CM | POA: Diagnosis not present

## 2021-08-21 DIAGNOSIS — Z992 Dependence on renal dialysis: Secondary | ICD-10-CM | POA: Diagnosis not present

## 2021-08-21 DIAGNOSIS — N186 End stage renal disease: Secondary | ICD-10-CM | POA: Diagnosis not present

## 2021-08-21 DIAGNOSIS — N2581 Secondary hyperparathyroidism of renal origin: Secondary | ICD-10-CM | POA: Diagnosis not present

## 2021-08-21 DIAGNOSIS — D649 Anemia, unspecified: Secondary | ICD-10-CM | POA: Diagnosis not present

## 2021-08-22 ENCOUNTER — Telehealth (HOSPITAL_COMMUNITY): Payer: Self-pay

## 2021-08-22 DIAGNOSIS — I132 Hypertensive heart and chronic kidney disease with heart failure and with stage 5 chronic kidney disease, or end stage renal disease: Secondary | ICD-10-CM | POA: Diagnosis not present

## 2021-08-22 DIAGNOSIS — Z681 Body mass index (BMI) 19 or less, adult: Secondary | ICD-10-CM | POA: Diagnosis not present

## 2021-08-22 DIAGNOSIS — I252 Old myocardial infarction: Secondary | ICD-10-CM | POA: Diagnosis not present

## 2021-08-22 DIAGNOSIS — K5641 Fecal impaction: Secondary | ICD-10-CM | POA: Diagnosis not present

## 2021-08-22 DIAGNOSIS — I5022 Chronic systolic (congestive) heart failure: Secondary | ICD-10-CM | POA: Diagnosis not present

## 2021-08-22 DIAGNOSIS — F32A Depression, unspecified: Secondary | ICD-10-CM | POA: Diagnosis not present

## 2021-08-22 DIAGNOSIS — I5030 Unspecified diastolic (congestive) heart failure: Secondary | ICD-10-CM | POA: Diagnosis not present

## 2021-08-22 DIAGNOSIS — Z7901 Long term (current) use of anticoagulants: Secondary | ICD-10-CM | POA: Diagnosis not present

## 2021-08-22 DIAGNOSIS — R1310 Dysphagia, unspecified: Secondary | ICD-10-CM | POA: Diagnosis not present

## 2021-08-22 DIAGNOSIS — I2510 Atherosclerotic heart disease of native coronary artery without angina pectoris: Secondary | ICD-10-CM | POA: Diagnosis not present

## 2021-08-22 DIAGNOSIS — E785 Hyperlipidemia, unspecified: Secondary | ICD-10-CM | POA: Diagnosis not present

## 2021-08-22 DIAGNOSIS — J9611 Chronic respiratory failure with hypoxia: Secondary | ICD-10-CM | POA: Diagnosis not present

## 2021-08-22 DIAGNOSIS — K769 Liver disease, unspecified: Secondary | ICD-10-CM | POA: Diagnosis not present

## 2021-08-22 DIAGNOSIS — N186 End stage renal disease: Secondary | ICD-10-CM | POA: Diagnosis not present

## 2021-08-22 DIAGNOSIS — I480 Paroxysmal atrial fibrillation: Secondary | ICD-10-CM | POA: Diagnosis not present

## 2021-08-22 DIAGNOSIS — E43 Unspecified severe protein-calorie malnutrition: Secondary | ICD-10-CM | POA: Diagnosis not present

## 2021-08-22 DIAGNOSIS — Z720 Tobacco use: Secondary | ICD-10-CM | POA: Diagnosis not present

## 2021-08-22 DIAGNOSIS — M1990 Unspecified osteoarthritis, unspecified site: Secondary | ICD-10-CM | POA: Diagnosis not present

## 2021-08-22 DIAGNOSIS — R634 Abnormal weight loss: Secondary | ICD-10-CM | POA: Diagnosis not present

## 2021-08-22 DIAGNOSIS — D631 Anemia in chronic kidney disease: Secondary | ICD-10-CM | POA: Diagnosis not present

## 2021-08-22 DIAGNOSIS — L89152 Pressure ulcer of sacral region, stage 2: Secondary | ICD-10-CM | POA: Diagnosis not present

## 2021-08-22 DIAGNOSIS — K5900 Constipation, unspecified: Secondary | ICD-10-CM | POA: Diagnosis not present

## 2021-08-22 NOTE — Telephone Encounter (Signed)
Ruben [name] advised and verbalized understanding  ?

## 2021-08-22 NOTE — Telephone Encounter (Signed)
Isaias Sakai, RN with [facility] called wanting to know if patient would be able to stop taking his Eliquis for a dental procedure involving multiple extractions.  ? ? ? ? ? ?CB# [phone] ?

## 2021-08-23 DIAGNOSIS — D509 Iron deficiency anemia, unspecified: Secondary | ICD-10-CM | POA: Diagnosis not present

## 2021-08-23 DIAGNOSIS — D649 Anemia, unspecified: Secondary | ICD-10-CM | POA: Diagnosis not present

## 2021-08-23 DIAGNOSIS — N2581 Secondary hyperparathyroidism of renal origin: Secondary | ICD-10-CM | POA: Diagnosis not present

## 2021-08-23 DIAGNOSIS — E43 Unspecified severe protein-calorie malnutrition: Secondary | ICD-10-CM | POA: Diagnosis not present

## 2021-08-23 DIAGNOSIS — N186 End stage renal disease: Secondary | ICD-10-CM | POA: Diagnosis not present

## 2021-08-23 DIAGNOSIS — I5021 Acute systolic (congestive) heart failure: Secondary | ICD-10-CM | POA: Diagnosis not present

## 2021-08-23 DIAGNOSIS — D689 Coagulation defect, unspecified: Secondary | ICD-10-CM | POA: Diagnosis not present

## 2021-08-23 DIAGNOSIS — Z992 Dependence on renal dialysis: Secondary | ICD-10-CM | POA: Diagnosis not present

## 2021-08-26 ENCOUNTER — Other Ambulatory Visit (HOSPITAL_COMMUNITY): Payer: Self-pay | Admitting: *Deleted

## 2021-08-26 DIAGNOSIS — E43 Unspecified severe protein-calorie malnutrition: Secondary | ICD-10-CM | POA: Diagnosis not present

## 2021-08-26 DIAGNOSIS — D649 Anemia, unspecified: Secondary | ICD-10-CM | POA: Diagnosis not present

## 2021-08-26 DIAGNOSIS — Z992 Dependence on renal dialysis: Secondary | ICD-10-CM | POA: Diagnosis not present

## 2021-08-26 DIAGNOSIS — D689 Coagulation defect, unspecified: Secondary | ICD-10-CM | POA: Diagnosis not present

## 2021-08-26 DIAGNOSIS — I5021 Acute systolic (congestive) heart failure: Secondary | ICD-10-CM | POA: Diagnosis not present

## 2021-08-26 DIAGNOSIS — D509 Iron deficiency anemia, unspecified: Secondary | ICD-10-CM | POA: Diagnosis not present

## 2021-08-26 DIAGNOSIS — N2581 Secondary hyperparathyroidism of renal origin: Secondary | ICD-10-CM | POA: Diagnosis not present

## 2021-08-26 DIAGNOSIS — N186 End stage renal disease: Secondary | ICD-10-CM | POA: Diagnosis not present

## 2021-08-26 MED ORDER — MIDODRINE HCL 10 MG PO TABS: 10.0000 mg | ORAL_TABLET | Freq: Three times a day (TID) | ORAL | 6 refills | Status: DC

## 2021-08-26 MED ORDER — APIXABAN 5 MG PO TABS: 5.0000 mg | ORAL_TABLET | Freq: Two times a day (BID) | ORAL | 6 refills | Status: DC

## 2021-08-26 MED ORDER — AMIODARONE HCL 200 MG PO TABS: 100.0000 mg | ORAL_TABLET | Freq: Every day | ORAL | 6 refills | Status: DC

## 2021-08-27 DIAGNOSIS — I5022 Chronic systolic (congestive) heart failure: Secondary | ICD-10-CM | POA: Diagnosis not present

## 2021-08-27 DIAGNOSIS — M1990 Unspecified osteoarthritis, unspecified site: Secondary | ICD-10-CM | POA: Diagnosis not present

## 2021-08-27 DIAGNOSIS — E785 Hyperlipidemia, unspecified: Secondary | ICD-10-CM | POA: Diagnosis not present

## 2021-08-27 DIAGNOSIS — I132 Hypertensive heart and chronic kidney disease with heart failure and with stage 5 chronic kidney disease, or end stage renal disease: Secondary | ICD-10-CM | POA: Diagnosis not present

## 2021-08-27 DIAGNOSIS — I5030 Unspecified diastolic (congestive) heart failure: Secondary | ICD-10-CM | POA: Diagnosis not present

## 2021-08-27 DIAGNOSIS — Z720 Tobacco use: Secondary | ICD-10-CM | POA: Diagnosis not present

## 2021-08-27 DIAGNOSIS — R1310 Dysphagia, unspecified: Secondary | ICD-10-CM | POA: Diagnosis not present

## 2021-08-27 DIAGNOSIS — R634 Abnormal weight loss: Secondary | ICD-10-CM | POA: Diagnosis not present

## 2021-08-27 DIAGNOSIS — Z681 Body mass index (BMI) 19 or less, adult: Secondary | ICD-10-CM | POA: Diagnosis not present

## 2021-08-27 DIAGNOSIS — I871 Compression of vein: Secondary | ICD-10-CM | POA: Diagnosis not present

## 2021-08-27 DIAGNOSIS — L89152 Pressure ulcer of sacral region, stage 2: Secondary | ICD-10-CM | POA: Diagnosis not present

## 2021-08-27 DIAGNOSIS — Z992 Dependence on renal dialysis: Secondary | ICD-10-CM | POA: Diagnosis not present

## 2021-08-27 DIAGNOSIS — Z7901 Long term (current) use of anticoagulants: Secondary | ICD-10-CM | POA: Diagnosis not present

## 2021-08-27 DIAGNOSIS — I2510 Atherosclerotic heart disease of native coronary artery without angina pectoris: Secondary | ICD-10-CM | POA: Diagnosis not present

## 2021-08-27 DIAGNOSIS — I480 Paroxysmal atrial fibrillation: Secondary | ICD-10-CM | POA: Diagnosis not present

## 2021-08-27 DIAGNOSIS — K5641 Fecal impaction: Secondary | ICD-10-CM | POA: Diagnosis not present

## 2021-08-27 DIAGNOSIS — F32A Depression, unspecified: Secondary | ICD-10-CM | POA: Diagnosis not present

## 2021-08-27 DIAGNOSIS — J9611 Chronic respiratory failure with hypoxia: Secondary | ICD-10-CM | POA: Diagnosis not present

## 2021-08-27 DIAGNOSIS — K5900 Constipation, unspecified: Secondary | ICD-10-CM | POA: Diagnosis not present

## 2021-08-27 DIAGNOSIS — I252 Old myocardial infarction: Secondary | ICD-10-CM | POA: Diagnosis not present

## 2021-08-27 DIAGNOSIS — K769 Liver disease, unspecified: Secondary | ICD-10-CM | POA: Diagnosis not present

## 2021-08-27 DIAGNOSIS — E43 Unspecified severe protein-calorie malnutrition: Secondary | ICD-10-CM | POA: Diagnosis not present

## 2021-08-27 DIAGNOSIS — T82868A Thrombosis of vascular prosthetic devices, implants and grafts, initial encounter: Secondary | ICD-10-CM | POA: Diagnosis not present

## 2021-08-27 DIAGNOSIS — D631 Anemia in chronic kidney disease: Secondary | ICD-10-CM | POA: Diagnosis not present

## 2021-08-27 DIAGNOSIS — N186 End stage renal disease: Secondary | ICD-10-CM | POA: Diagnosis not present

## 2021-08-28 DIAGNOSIS — D689 Coagulation defect, unspecified: Secondary | ICD-10-CM | POA: Diagnosis not present

## 2021-08-28 DIAGNOSIS — N2581 Secondary hyperparathyroidism of renal origin: Secondary | ICD-10-CM | POA: Diagnosis not present

## 2021-08-28 DIAGNOSIS — Z992 Dependence on renal dialysis: Secondary | ICD-10-CM | POA: Diagnosis not present

## 2021-08-28 DIAGNOSIS — D649 Anemia, unspecified: Secondary | ICD-10-CM | POA: Diagnosis not present

## 2021-08-28 DIAGNOSIS — N186 End stage renal disease: Secondary | ICD-10-CM | POA: Diagnosis not present

## 2021-08-28 DIAGNOSIS — D509 Iron deficiency anemia, unspecified: Secondary | ICD-10-CM | POA: Diagnosis not present

## 2021-08-29 ENCOUNTER — Ambulatory Visit: Payer: Medicare Other | Admitting: Nurse Practitioner

## 2021-08-29 DIAGNOSIS — E43 Unspecified severe protein-calorie malnutrition: Secondary | ICD-10-CM | POA: Diagnosis not present

## 2021-08-29 DIAGNOSIS — I5021 Acute systolic (congestive) heart failure: Secondary | ICD-10-CM | POA: Diagnosis not present

## 2021-08-30 DIAGNOSIS — D649 Anemia, unspecified: Secondary | ICD-10-CM | POA: Diagnosis not present

## 2021-08-30 DIAGNOSIS — Z992 Dependence on renal dialysis: Secondary | ICD-10-CM | POA: Diagnosis not present

## 2021-08-30 DIAGNOSIS — N186 End stage renal disease: Secondary | ICD-10-CM | POA: Diagnosis not present

## 2021-08-30 DIAGNOSIS — N2581 Secondary hyperparathyroidism of renal origin: Secondary | ICD-10-CM | POA: Diagnosis not present

## 2021-08-30 DIAGNOSIS — D689 Coagulation defect, unspecified: Secondary | ICD-10-CM | POA: Diagnosis not present

## 2021-08-30 DIAGNOSIS — D509 Iron deficiency anemia, unspecified: Secondary | ICD-10-CM | POA: Diagnosis not present

## 2021-09-01 DIAGNOSIS — N186 End stage renal disease: Secondary | ICD-10-CM | POA: Diagnosis not present

## 2021-09-01 DIAGNOSIS — N179 Acute kidney failure, unspecified: Secondary | ICD-10-CM | POA: Diagnosis not present

## 2021-09-01 DIAGNOSIS — Z992 Dependence on renal dialysis: Secondary | ICD-10-CM | POA: Diagnosis not present

## 2021-09-02 DIAGNOSIS — N2581 Secondary hyperparathyroidism of renal origin: Secondary | ICD-10-CM | POA: Diagnosis not present

## 2021-09-02 DIAGNOSIS — N186 End stage renal disease: Secondary | ICD-10-CM | POA: Diagnosis not present

## 2021-09-02 DIAGNOSIS — D649 Anemia, unspecified: Secondary | ICD-10-CM | POA: Diagnosis not present

## 2021-09-02 DIAGNOSIS — D689 Coagulation defect, unspecified: Secondary | ICD-10-CM | POA: Diagnosis not present

## 2021-09-02 DIAGNOSIS — D509 Iron deficiency anemia, unspecified: Secondary | ICD-10-CM | POA: Diagnosis not present

## 2021-09-02 DIAGNOSIS — Z992 Dependence on renal dialysis: Secondary | ICD-10-CM | POA: Diagnosis not present

## 2021-09-03 DIAGNOSIS — E43 Unspecified severe protein-calorie malnutrition: Secondary | ICD-10-CM | POA: Diagnosis not present

## 2021-09-03 DIAGNOSIS — F32A Depression, unspecified: Secondary | ICD-10-CM | POA: Diagnosis not present

## 2021-09-03 DIAGNOSIS — I252 Old myocardial infarction: Secondary | ICD-10-CM | POA: Diagnosis not present

## 2021-09-03 DIAGNOSIS — I2510 Atherosclerotic heart disease of native coronary artery without angina pectoris: Secondary | ICD-10-CM | POA: Diagnosis not present

## 2021-09-03 DIAGNOSIS — I5022 Chronic systolic (congestive) heart failure: Secondary | ICD-10-CM | POA: Diagnosis not present

## 2021-09-03 DIAGNOSIS — R1310 Dysphagia, unspecified: Secondary | ICD-10-CM | POA: Diagnosis not present

## 2021-09-03 DIAGNOSIS — K769 Liver disease, unspecified: Secondary | ICD-10-CM | POA: Diagnosis not present

## 2021-09-03 DIAGNOSIS — N186 End stage renal disease: Secondary | ICD-10-CM | POA: Diagnosis not present

## 2021-09-03 DIAGNOSIS — I480 Paroxysmal atrial fibrillation: Secondary | ICD-10-CM | POA: Diagnosis not present

## 2021-09-03 DIAGNOSIS — K5641 Fecal impaction: Secondary | ICD-10-CM | POA: Diagnosis not present

## 2021-09-03 DIAGNOSIS — D631 Anemia in chronic kidney disease: Secondary | ICD-10-CM | POA: Diagnosis not present

## 2021-09-03 DIAGNOSIS — Z681 Body mass index (BMI) 19 or less, adult: Secondary | ICD-10-CM | POA: Diagnosis not present

## 2021-09-03 DIAGNOSIS — M1990 Unspecified osteoarthritis, unspecified site: Secondary | ICD-10-CM | POA: Diagnosis not present

## 2021-09-03 DIAGNOSIS — I5030 Unspecified diastolic (congestive) heart failure: Secondary | ICD-10-CM | POA: Diagnosis not present

## 2021-09-03 DIAGNOSIS — E785 Hyperlipidemia, unspecified: Secondary | ICD-10-CM | POA: Diagnosis not present

## 2021-09-03 DIAGNOSIS — I132 Hypertensive heart and chronic kidney disease with heart failure and with stage 5 chronic kidney disease, or end stage renal disease: Secondary | ICD-10-CM | POA: Diagnosis not present

## 2021-09-03 DIAGNOSIS — J9611 Chronic respiratory failure with hypoxia: Secondary | ICD-10-CM | POA: Diagnosis not present

## 2021-09-03 DIAGNOSIS — R634 Abnormal weight loss: Secondary | ICD-10-CM | POA: Diagnosis not present

## 2021-09-03 DIAGNOSIS — L89152 Pressure ulcer of sacral region, stage 2: Secondary | ICD-10-CM | POA: Diagnosis not present

## 2021-09-03 DIAGNOSIS — K5900 Constipation, unspecified: Secondary | ICD-10-CM | POA: Diagnosis not present

## 2021-09-03 DIAGNOSIS — Z7901 Long term (current) use of anticoagulants: Secondary | ICD-10-CM | POA: Diagnosis not present

## 2021-09-03 DIAGNOSIS — Z720 Tobacco use: Secondary | ICD-10-CM | POA: Diagnosis not present

## 2021-09-04 DIAGNOSIS — D689 Coagulation defect, unspecified: Secondary | ICD-10-CM | POA: Diagnosis not present

## 2021-09-04 DIAGNOSIS — D649 Anemia, unspecified: Secondary | ICD-10-CM | POA: Diagnosis not present

## 2021-09-04 DIAGNOSIS — D509 Iron deficiency anemia, unspecified: Secondary | ICD-10-CM | POA: Diagnosis not present

## 2021-09-04 DIAGNOSIS — N2581 Secondary hyperparathyroidism of renal origin: Secondary | ICD-10-CM | POA: Diagnosis not present

## 2021-09-04 DIAGNOSIS — Z992 Dependence on renal dialysis: Secondary | ICD-10-CM | POA: Diagnosis not present

## 2021-09-04 DIAGNOSIS — N186 End stage renal disease: Secondary | ICD-10-CM | POA: Diagnosis not present

## 2021-09-05 DIAGNOSIS — H40013 Open angle with borderline findings, low risk, bilateral: Secondary | ICD-10-CM | POA: Diagnosis not present

## 2021-09-05 DIAGNOSIS — H04123 Dry eye syndrome of bilateral lacrimal glands: Secondary | ICD-10-CM | POA: Diagnosis not present

## 2021-09-05 DIAGNOSIS — H35033 Hypertensive retinopathy, bilateral: Secondary | ICD-10-CM | POA: Diagnosis not present

## 2021-09-06 DIAGNOSIS — D509 Iron deficiency anemia, unspecified: Secondary | ICD-10-CM | POA: Diagnosis not present

## 2021-09-06 DIAGNOSIS — D689 Coagulation defect, unspecified: Secondary | ICD-10-CM | POA: Diagnosis not present

## 2021-09-06 DIAGNOSIS — N2581 Secondary hyperparathyroidism of renal origin: Secondary | ICD-10-CM | POA: Diagnosis not present

## 2021-09-06 DIAGNOSIS — Z992 Dependence on renal dialysis: Secondary | ICD-10-CM | POA: Diagnosis not present

## 2021-09-06 DIAGNOSIS — D649 Anemia, unspecified: Secondary | ICD-10-CM | POA: Diagnosis not present

## 2021-09-06 DIAGNOSIS — N186 End stage renal disease: Secondary | ICD-10-CM | POA: Diagnosis not present

## 2021-09-09 DIAGNOSIS — D649 Anemia, unspecified: Secondary | ICD-10-CM | POA: Diagnosis not present

## 2021-09-09 DIAGNOSIS — D689 Coagulation defect, unspecified: Secondary | ICD-10-CM | POA: Diagnosis not present

## 2021-09-09 DIAGNOSIS — Z992 Dependence on renal dialysis: Secondary | ICD-10-CM | POA: Diagnosis not present

## 2021-09-09 DIAGNOSIS — N2581 Secondary hyperparathyroidism of renal origin: Secondary | ICD-10-CM | POA: Diagnosis not present

## 2021-09-09 DIAGNOSIS — N186 End stage renal disease: Secondary | ICD-10-CM | POA: Diagnosis not present

## 2021-09-09 DIAGNOSIS — D509 Iron deficiency anemia, unspecified: Secondary | ICD-10-CM | POA: Diagnosis not present

## 2021-09-11 DIAGNOSIS — Z992 Dependence on renal dialysis: Secondary | ICD-10-CM | POA: Diagnosis not present

## 2021-09-11 DIAGNOSIS — D509 Iron deficiency anemia, unspecified: Secondary | ICD-10-CM | POA: Diagnosis not present

## 2021-09-11 DIAGNOSIS — D689 Coagulation defect, unspecified: Secondary | ICD-10-CM | POA: Diagnosis not present

## 2021-09-11 DIAGNOSIS — D649 Anemia, unspecified: Secondary | ICD-10-CM | POA: Diagnosis not present

## 2021-09-11 DIAGNOSIS — N2581 Secondary hyperparathyroidism of renal origin: Secondary | ICD-10-CM | POA: Diagnosis not present

## 2021-09-11 DIAGNOSIS — N186 End stage renal disease: Secondary | ICD-10-CM | POA: Diagnosis not present

## 2021-09-13 DIAGNOSIS — Z992 Dependence on renal dialysis: Secondary | ICD-10-CM | POA: Diagnosis not present

## 2021-09-13 DIAGNOSIS — D649 Anemia, unspecified: Secondary | ICD-10-CM | POA: Diagnosis not present

## 2021-09-13 DIAGNOSIS — D689 Coagulation defect, unspecified: Secondary | ICD-10-CM | POA: Diagnosis not present

## 2021-09-13 DIAGNOSIS — N186 End stage renal disease: Secondary | ICD-10-CM | POA: Diagnosis not present

## 2021-09-13 DIAGNOSIS — N2581 Secondary hyperparathyroidism of renal origin: Secondary | ICD-10-CM | POA: Diagnosis not present

## 2021-09-13 DIAGNOSIS — D509 Iron deficiency anemia, unspecified: Secondary | ICD-10-CM | POA: Diagnosis not present

## 2021-09-16 DIAGNOSIS — D649 Anemia, unspecified: Secondary | ICD-10-CM | POA: Diagnosis not present

## 2021-09-16 DIAGNOSIS — Z992 Dependence on renal dialysis: Secondary | ICD-10-CM | POA: Diagnosis not present

## 2021-09-16 DIAGNOSIS — D509 Iron deficiency anemia, unspecified: Secondary | ICD-10-CM | POA: Diagnosis not present

## 2021-09-16 DIAGNOSIS — N2581 Secondary hyperparathyroidism of renal origin: Secondary | ICD-10-CM | POA: Diagnosis not present

## 2021-09-16 DIAGNOSIS — D689 Coagulation defect, unspecified: Secondary | ICD-10-CM | POA: Diagnosis not present

## 2021-09-16 DIAGNOSIS — N186 End stage renal disease: Secondary | ICD-10-CM | POA: Diagnosis not present

## 2021-09-17 ENCOUNTER — Ambulatory Visit (HOSPITAL_COMMUNITY)
Admission: RE | Admit: 2021-09-17 | Discharge: 2021-09-17 | Disposition: A | Discharge status: Home / Self Care | Payer: Medicare Other | Source: Ambulatory Visit | Attending: Cardiology | Admitting: Cardiology

## 2021-09-17 ENCOUNTER — Encounter (HOSPITAL_COMMUNITY): Payer: Self-pay | Admitting: Cardiology

## 2021-09-17 VITALS — BP 104/60 | HR 84 | Wt 148.0 lb

## 2021-09-17 DIAGNOSIS — N186 End stage renal disease: Secondary | ICD-10-CM | POA: Diagnosis not present

## 2021-09-17 DIAGNOSIS — I252 Old myocardial infarction: Secondary | ICD-10-CM | POA: Insufficient documentation

## 2021-09-17 DIAGNOSIS — Z87891 Personal history of nicotine dependence: Secondary | ICD-10-CM | POA: Insufficient documentation

## 2021-09-17 DIAGNOSIS — I480 Paroxysmal atrial fibrillation: Secondary | ICD-10-CM | POA: Diagnosis not present

## 2021-09-17 DIAGNOSIS — I5022 Chronic systolic (congestive) heart failure: Secondary | ICD-10-CM

## 2021-09-17 DIAGNOSIS — Z7901 Long term (current) use of anticoagulants: Secondary | ICD-10-CM | POA: Insufficient documentation

## 2021-09-17 DIAGNOSIS — Z79899 Other long term (current) drug therapy: Secondary | ICD-10-CM | POA: Insufficient documentation

## 2021-09-17 DIAGNOSIS — I132 Hypertensive heart and chronic kidney disease with heart failure and with stage 5 chronic kidney disease, or end stage renal disease: Secondary | ICD-10-CM | POA: Diagnosis not present

## 2021-09-17 DIAGNOSIS — I255 Ischemic cardiomyopathy: Secondary | ICD-10-CM | POA: Diagnosis not present

## 2021-09-17 DIAGNOSIS — F410 Panic disorder [episodic paroxysmal anxiety] without agoraphobia: Secondary | ICD-10-CM | POA: Insufficient documentation

## 2021-09-17 DIAGNOSIS — G4700 Insomnia, unspecified: Secondary | ICD-10-CM | POA: Diagnosis not present

## 2021-09-17 DIAGNOSIS — I2510 Atherosclerotic heart disease of native coronary artery without angina pectoris: Secondary | ICD-10-CM | POA: Diagnosis not present

## 2021-09-17 DIAGNOSIS — I953 Hypotension of hemodialysis: Secondary | ICD-10-CM | POA: Insufficient documentation

## 2021-09-17 MED ORDER — MIDODRINE HCL 10 MG PO TABS: 15.0000 mg | ORAL_TABLET | Freq: Three times a day (TID) | ORAL | 6 refills | Status: DC

## 2021-09-17 MED ORDER — TRAZODONE HCL 50 MG PO TABS: 50.0000 mg | ORAL_TABLET | Freq: Every day | ORAL | 3 refills | Status: DC

## 2021-09-17 MED ORDER — ALPRAZOLAM 0.25 MG PO TABS: 0.2500 mg | ORAL_TABLET | Freq: Every day | ORAL | 1 refills | Status: DC | PRN

## 2021-09-17 NOTE — Patient Instructions (Signed)
INCREASE Midodrine to 15 mg (one and one half tab) three times daily ?START Trazodone 50 mh, one tab nightly at bedtime ?START Xanax 0.25 mg one tab daily as needed prior to dialysis ? ? ?You have been referred to [facility] ?-they will be in contact with an appointment ? ?Your physician has requested that you have an echocardiogram. Echocardiography is a painless test that uses sound waves to create images of your heart. It provides your doctor with information about the size and shape of your heart and how well your heart?s chambers and valves are working. This procedure takes approximately one hour. There are no restrictions for this procedure. ? ?Your physician recommends that you schedule a follow-up appointment in: 2 months  in the Advanced Practitioners (PA/NP) Clinic  ? ?Do the following things EVERYDAY: ?Weigh yourself in the morning before breakfast. Write it down and keep it in a log. ?Take your medicines as prescribed ?Eat low salt foods--Limit salt (sodium) to 2000 mg per day.  ?Stay as active as you can everyday ?Limit all fluids for the day to less than 2 liters ? ?At the [facility], you and your health needs are our priority. As part of our continuing mission to provide you with exceptional heart care, we have created designated Provider Care Teams. These Care Teams include your primary Cardiologist (physician) and Advanced Practice Providers (APPs- Physician Assistants and Nurse Practitioners) who all work together to provide you with the care you need, when you need it.  ? ?You may see any of the following providers on your designated Care Team at your next follow up: ?Dr Glori Bickers ?Dr Loralie Champagne ?Darrick Grinder, NP ?Lyda Jester, PA ?Jessica Milford,NP ?Marlyce Huge, PA ?Audry Riles, PharmD ? ? ?Please be sure to bring in all your medications bottles to every appointment.  ? ? ? ? ?

## 2021-09-17 NOTE — Progress Notes (Signed)
? ?ADVANCED HF CLINIC CONSULT NOTE ? ?Primary Care: [name], MD ?HF Cardiologist: Dr. Aundra Dubin ? ?HPI: ?Ruben [name] 76 y.o. male with history of HTN, HLD, ESRD, tobacco and alcohol abuse (pint a day of wine formerly), depression.  ? ?Admitted 04/29/21 with suspected late presentation MI. Went into AF with RVR and placed on BiPAP. He was initiated on IV amio, heparin gtt, nitro gtt and IV diuretics. Echo with LVEF 35-40%, inferior hypokinesis, RV okay, splay artifact with likely moderate to severe MR. Subsequently developed cardiogenic shock. PICC line placed.  Initial Co-ox low at 47% and started on NE + milrinone. Advanced HF team consulted. Underwent TEE showing EF 35-40%, RV okay, moderate AI, moderate MR (suspect infarct related). R/LHC (initially delayed d/t AKI on CKD): Occluded RCA (culprit), diffuse disease in LAD and Lcx. On 0.25 mil RA mean 16, PA 66/26 (41), PCWP 27, PA 62%, Fick CO 5.04/ CI 2.56.  ?  ?Seen by CVTS. Not felt to be a surgical candidate. Attempted to wean off milrinone. Remained in AF despite Amio gtt. Underwent TEE/DCCV --> SR. Repeat echo EF 35-40%, RV okay, moderate AI, trivial TR, moderate, likely infarct-related, MR. IV amio weaned off and transitioned to amio 200 mg daily. After procedures completed he was started on Eliquis. Course c/b AKI on CKD, nephrology consulted. Felt to be d/t worsening ATN in setting of shock/episodes of hypotension versus CIN. Placed on CVVHD and later switched to iHD. Had tunneled HD catheter placed. Vascular consulted and placed AVG. Discharged home, weight 165 lbs. ? ?Admitted 2/23 with rectal pain and dysphagia. GI consulted and patient manually disimpacted. Had MRI liver due to elevated LFTs which showed 12 mm benign lesions.  ? ?Patient returns for followup of CHF and CAD.  He has had trouble with hypotension with dialysis.  He is on midodrine 10 tid.  Hypotension has limited dialysis.  He is not lightheaded on non-HD days.  He walks with a  cane but denies dyspnea on flat ground. No chest pain.  He has a lot of trouble sleeping but denies orthopnea/PND.  He is in NSR today, no palpitations.  He reports getting "panic attacks" when he goes to start HD.  He decreased atorvastatin recently due to diffuse weakness but does not feel like this helped.  ? ?ECG (personally reviewed): NSR, nonspecific T wave changes.  ? ?Labs (3/23): LDL 76, HDL 37, LFTs normal ? ?PMH: ?1. ESRD ?2. H/o ETOH abuse: Quit 12/22.  ?3. Atrial fibrillation: Paroxysmal, s/p DCCV in 1/23.  ?4. Mitral regurgitation: Suspect infarct-related.  ?- TEE (1/23): EF 35-40%, basal to mid inferior and inferolateral AK, RV normal, moderate AI, suspect moderate infarct-related MR.  ?5. Depression ?6. CAD: 12/22 late presentation inferior MI.   ?- LHC (1/23): Occluded RCA with collaterals, serial 50%/80%/80% mid-distal LCx stenoses, serial 60%/70%/50% proximal to mid LAD stenoses, 70% ostial D1. No good PCI targets and not a CABG candidate.  ?7. Chronic systolic CHF: TEE (6/28) with EF 35-40%, basal to mid inferior and inferolateral AK, RV normal, moderate AI, suspect moderate infarct-related MR. ? ?Review of Systems: All systems reviewed and negative except as per HPI.  ? ? ?Current Outpatient Medications  ?Medication Sig Dispense Refill  ? acetaminophen (TYLENOL) 650 MG CR tablet Take 650 mg by mouth every 8 (eight) hours as needed for pain.    ? albuterol (PROVENTIL) (2.5 MG/3ML) 0.083% nebulizer solution Take 3 mLs (2.5 mg total) by nebulization every 4 (four) hours as needed for wheezing or  shortness of breath. 75 mL 2  ? ALPRAZolam (XANAX) 0.25 MG tablet Take 1 tablet (0.25 mg total) by mouth daily as needed for anxiety (anxiety with dialysis). 30 tablet 1  ? amiodarone (PACERONE) 200 MG tablet Take 0.5 tablets (100 mg total) by mouth daily. 15 tablet 6  ? apixaban (ELIQUIS) 5 MG TABS tablet Take 1 tablet (5 mg total) by mouth 2 (two) times daily. 60 tablet 6  ? atorvastatin (LIPITOR) 40 MG  tablet Take 1 tablet (40 mg total) by mouth daily. 30 tablet 11  ? cholecalciferol (VITAMIN D3) 25 MCG (1000 UNIT) tablet Take 1,000 Units by mouth daily.    ? diltiazem 2 % GEL Apply 1 application. topically 2 (two) times daily. Apply to anal fissures as needed    ? famotidine (PEPCID) 20 MG tablet Take 1 tablet (20 mg total) by mouth daily. Take 1 tablet after dialysis for 2 weeks 6 tablet 0  ? hydrOXYzine (ATARAX) 10 MG tablet Take 10 mg by mouth at bedtime.    ? Lidocaine-Hydrocortisone Ace 3-2.5 % KIT Place 1 application. rectally 2 (two) times daily. As needed    ? magnesium oxide (MAG-OX) 400 (240 Mg) MG tablet Take 1 tablet by mouth daily.    ? metoCLOPramide (REGLAN) 5 MG tablet Take 1 tablet (5 mg total) by mouth every 8 (eight) hours as needed for nausea. 6 tablet 0  ? midodrine (PROAMATINE) 10 MG tablet Take 10 mg by mouth 3 (three) times a week. Pre  dialysis days AM dose    ? mirtazapine (REMERON) 7.5 MG tablet Take 1 tablet (7.5 mg total) by mouth at bedtime. 30 tablet 0  ? multivitamin (RENA-VIT) TABS tablet Take 1 tablet by mouth at bedtime. 30 tablet 6  ? nitroGLYCERIN (NITROSTAT) 0.4 MG SL tablet Place 1 tablet (0.4 mg total) under the tongue every 5 (five) minutes as needed for chest pain. 100 tablet 3  ? ondansetron (ZOFRAN) 4 MG tablet Take 1 tablet (4 mg total) by mouth daily as needed for nausea or vomiting. 30 tablet 1  ? phenylephrine (,USE FOR PREPARATION-H,) 0.25 % suppository Place 1 suppository rectally as needed for hemorrhoids.    ? traZODone (DESYREL) 50 MG tablet Take 1 tablet (50 mg total) by mouth at bedtime. 90 tablet 3  ? midodrine (PROAMATINE) 10 MG tablet Take 1.5 tablets (15 mg total) by mouth 3 (three) times daily with meals. 135 tablet 6  ? ?No current facility-administered medications for this encounter.  ? ?Allergies  ?Allergen Reactions  ? Other Other (See Comments)  ?  Pollen - sneezing, itching/watery eyes  ? ?Social History  ? ?Socioeconomic History  ? Marital status:  Single  ?  Spouse name: Not on file  ? Number of children: Not on file  ? Years of education: Not on file  ? Highest education level: Not on file  ?Occupational History  ? Not on file  ?Tobacco Use  ? Smoking status: Former  ?  Packs/day: 1.50  ?  Years: 20.00  ?  Pack years: 30.00  ?  Types: Cigarettes  ? Smokeless tobacco: Never  ? Tobacco comments:  ?  down to 2 packs per week; 3/21 - down to 1 Pack per week  ?Vaping Use  ? Vaping Use: Never used  ?Substance and Sexual Activity  ? Alcohol use: No  ?  Alcohol/week: 0.0 standard drinks  ? Drug use: No  ? Sexual activity: Not on file  ?Other Topics Concern  ?  Not on file  ?Social History Narrative  ? Not on file  ? ?Social Determinants of Health  ? ?Financial Resource Strain: Medium Risk  ? Difficulty of Paying Living Expenses: Somewhat hard  ?Food Insecurity: No Food Insecurity  ? Worried About Charity fundraiser in the Last Year: Never true  ? Ran Out of Food in the Last Year: Never true  ?Transportation Needs: No Transportation Needs  ? Lack of Transportation (Medical): No  ? Lack of Transportation (Non-Medical): No  ?Physical Activity: Not on file  ?Stress: Not on file  ?Social Connections: Not on file  ?Intimate Partner Violence: Not on file  ? ?Family History  ?Problem Relation Age of Onset  ? Heart attack Brother   ? Diabetes Brother   ? Heart disease Brother   ? Cancer Father   ? Diabetes Brother   ? Heart disease Brother   ? ?BP 104/60   Pulse 84   Wt 67.1 kg (148 lb)   SpO2 93%   BMI 20.64 kg/m?  ? ?Wt Readings from Last 3 Encounters:  ?09/17/21 67.1 kg (148 lb)  ?07/23/21 62.7 kg (138 lb 3.2 oz)  ?07/18/21 62.3 kg (137 lb 6.4 oz)  ? ?PHYSICAL EXAM: ?General: NAD, thin.  ?Neck: No JVD, no thyromegaly or thyroid nodule.  ?Lungs: Clear to auscultation bilaterally with normal respiratory effort. ?CV: Nondisplaced PMI.  Heart regular S1/S2, no S3/S4, 2/6 HSM apex.  No peripheral edema.  No carotid bruit.  Normal pedal pulses.  ?Abdomen: Soft, nontender,  no hepatosplenomegaly, no distention.  ?Skin: Intact without lesions or rashes.  ?Neurologic: Alert and oriented x 3.  ?Psych: Normal affect. ?Extremities: No clubbing or cyanosis.  ?HEENT: Normal.  ? ?ASS

## 2021-09-18 DIAGNOSIS — N186 End stage renal disease: Secondary | ICD-10-CM | POA: Diagnosis not present

## 2021-09-18 DIAGNOSIS — D509 Iron deficiency anemia, unspecified: Secondary | ICD-10-CM | POA: Diagnosis not present

## 2021-09-18 DIAGNOSIS — Z992 Dependence on renal dialysis: Secondary | ICD-10-CM | POA: Diagnosis not present

## 2021-09-18 DIAGNOSIS — D689 Coagulation defect, unspecified: Secondary | ICD-10-CM | POA: Diagnosis not present

## 2021-09-18 DIAGNOSIS — D649 Anemia, unspecified: Secondary | ICD-10-CM | POA: Diagnosis not present

## 2021-09-18 DIAGNOSIS — N2581 Secondary hyperparathyroidism of renal origin: Secondary | ICD-10-CM | POA: Diagnosis not present

## 2021-09-20 DIAGNOSIS — N2581 Secondary hyperparathyroidism of renal origin: Secondary | ICD-10-CM | POA: Diagnosis not present

## 2021-09-20 DIAGNOSIS — D509 Iron deficiency anemia, unspecified: Secondary | ICD-10-CM | POA: Diagnosis not present

## 2021-09-20 DIAGNOSIS — N186 End stage renal disease: Secondary | ICD-10-CM | POA: Diagnosis not present

## 2021-09-20 DIAGNOSIS — D689 Coagulation defect, unspecified: Secondary | ICD-10-CM | POA: Diagnosis not present

## 2021-09-20 DIAGNOSIS — Z992 Dependence on renal dialysis: Secondary | ICD-10-CM | POA: Diagnosis not present

## 2021-09-20 DIAGNOSIS — D649 Anemia, unspecified: Secondary | ICD-10-CM | POA: Diagnosis not present

## 2021-09-22 DIAGNOSIS — E43 Unspecified severe protein-calorie malnutrition: Secondary | ICD-10-CM | POA: Diagnosis not present

## 2021-09-22 DIAGNOSIS — I5021 Acute systolic (congestive) heart failure: Secondary | ICD-10-CM | POA: Diagnosis not present

## 2021-09-23 DIAGNOSIS — D689 Coagulation defect, unspecified: Secondary | ICD-10-CM | POA: Diagnosis not present

## 2021-09-23 DIAGNOSIS — Z992 Dependence on renal dialysis: Secondary | ICD-10-CM | POA: Diagnosis not present

## 2021-09-23 DIAGNOSIS — N2581 Secondary hyperparathyroidism of renal origin: Secondary | ICD-10-CM | POA: Diagnosis not present

## 2021-09-23 DIAGNOSIS — N186 End stage renal disease: Secondary | ICD-10-CM | POA: Diagnosis not present

## 2021-09-23 DIAGNOSIS — D509 Iron deficiency anemia, unspecified: Secondary | ICD-10-CM | POA: Diagnosis not present

## 2021-09-23 DIAGNOSIS — D649 Anemia, unspecified: Secondary | ICD-10-CM | POA: Diagnosis not present

## 2021-09-25 ENCOUNTER — Encounter (HOSPITAL_COMMUNITY): Payer: Self-pay

## 2021-09-25 ENCOUNTER — Telehealth (HOSPITAL_COMMUNITY): Payer: Self-pay

## 2021-09-25 DIAGNOSIS — N186 End stage renal disease: Secondary | ICD-10-CM | POA: Diagnosis not present

## 2021-09-25 DIAGNOSIS — E43 Unspecified severe protein-calorie malnutrition: Secondary | ICD-10-CM | POA: Diagnosis not present

## 2021-09-25 DIAGNOSIS — D649 Anemia, unspecified: Secondary | ICD-10-CM | POA: Diagnosis not present

## 2021-09-25 DIAGNOSIS — I5021 Acute systolic (congestive) heart failure: Secondary | ICD-10-CM | POA: Diagnosis not present

## 2021-09-25 DIAGNOSIS — D509 Iron deficiency anemia, unspecified: Secondary | ICD-10-CM | POA: Diagnosis not present

## 2021-09-25 DIAGNOSIS — D689 Coagulation defect, unspecified: Secondary | ICD-10-CM | POA: Diagnosis not present

## 2021-09-25 DIAGNOSIS — N2581 Secondary hyperparathyroidism of renal origin: Secondary | ICD-10-CM | POA: Diagnosis not present

## 2021-09-25 DIAGNOSIS — Z992 Dependence on renal dialysis: Secondary | ICD-10-CM | POA: Diagnosis not present

## 2021-09-25 NOTE — Telephone Encounter (Signed)
Attempted to call patient in regards to Cardiac Rehab - LM on VM Mailed letter 

## 2021-09-25 NOTE — Telephone Encounter (Signed)
Pt's daughter called back and stated that pt is interested in the cardiac rehab program. Pt's daughter wanted to make sure that the insurance will cover him 100%. I advised pt's daughter that we would call his insurance to see if he has any pt responsibility.

## 2021-09-27 DIAGNOSIS — N186 End stage renal disease: Secondary | ICD-10-CM | POA: Diagnosis not present

## 2021-09-27 DIAGNOSIS — D509 Iron deficiency anemia, unspecified: Secondary | ICD-10-CM | POA: Diagnosis not present

## 2021-09-27 DIAGNOSIS — N2581 Secondary hyperparathyroidism of renal origin: Secondary | ICD-10-CM | POA: Diagnosis not present

## 2021-09-27 DIAGNOSIS — Z992 Dependence on renal dialysis: Secondary | ICD-10-CM | POA: Diagnosis not present

## 2021-09-27 DIAGNOSIS — D649 Anemia, unspecified: Secondary | ICD-10-CM | POA: Diagnosis not present

## 2021-09-27 DIAGNOSIS — D689 Coagulation defect, unspecified: Secondary | ICD-10-CM | POA: Diagnosis not present

## 2021-09-28 DIAGNOSIS — E43 Unspecified severe protein-calorie malnutrition: Secondary | ICD-10-CM | POA: Diagnosis not present

## 2021-09-28 DIAGNOSIS — I5021 Acute systolic (congestive) heart failure: Secondary | ICD-10-CM | POA: Diagnosis not present

## 2021-09-30 DIAGNOSIS — N186 End stage renal disease: Secondary | ICD-10-CM | POA: Diagnosis not present

## 2021-09-30 DIAGNOSIS — D689 Coagulation defect, unspecified: Secondary | ICD-10-CM | POA: Diagnosis not present

## 2021-09-30 DIAGNOSIS — Z992 Dependence on renal dialysis: Secondary | ICD-10-CM | POA: Diagnosis not present

## 2021-09-30 DIAGNOSIS — N2581 Secondary hyperparathyroidism of renal origin: Secondary | ICD-10-CM | POA: Diagnosis not present

## 2021-09-30 DIAGNOSIS — D649 Anemia, unspecified: Secondary | ICD-10-CM | POA: Diagnosis not present

## 2021-09-30 DIAGNOSIS — D509 Iron deficiency anemia, unspecified: Secondary | ICD-10-CM | POA: Diagnosis not present

## 2021-10-02 DIAGNOSIS — D649 Anemia, unspecified: Secondary | ICD-10-CM | POA: Diagnosis not present

## 2021-10-02 DIAGNOSIS — N2581 Secondary hyperparathyroidism of renal origin: Secondary | ICD-10-CM | POA: Diagnosis not present

## 2021-10-02 DIAGNOSIS — N186 End stage renal disease: Secondary | ICD-10-CM | POA: Diagnosis not present

## 2021-10-02 DIAGNOSIS — D509 Iron deficiency anemia, unspecified: Secondary | ICD-10-CM | POA: Diagnosis not present

## 2021-10-02 DIAGNOSIS — N179 Acute kidney failure, unspecified: Secondary | ICD-10-CM | POA: Diagnosis not present

## 2021-10-02 DIAGNOSIS — D689 Coagulation defect, unspecified: Secondary | ICD-10-CM | POA: Diagnosis not present

## 2021-10-02 DIAGNOSIS — Z992 Dependence on renal dialysis: Secondary | ICD-10-CM | POA: Diagnosis not present

## 2021-10-03 ENCOUNTER — Ambulatory Visit (HOSPITAL_COMMUNITY)
Admission: RE | Admit: 2021-10-03 | Discharge: 2021-10-03 | Disposition: A | Discharge status: Home / Self Care | Payer: Medicare Other | Source: Ambulatory Visit | Attending: Cardiology | Admitting: Cardiology

## 2021-10-03 DIAGNOSIS — I5022 Chronic systolic (congestive) heart failure: Secondary | ICD-10-CM | POA: Diagnosis not present

## 2021-10-03 DIAGNOSIS — I083 Combined rheumatic disorders of mitral, aortic and tricuspid valves: Secondary | ICD-10-CM | POA: Diagnosis not present

## 2021-10-03 DIAGNOSIS — R9431 Abnormal electrocardiogram [ECG] [EKG]: Secondary | ICD-10-CM | POA: Insufficient documentation

## 2021-10-03 DIAGNOSIS — I4891 Unspecified atrial fibrillation: Secondary | ICD-10-CM | POA: Diagnosis not present

## 2021-10-03 DIAGNOSIS — I252 Old myocardial infarction: Secondary | ICD-10-CM | POA: Diagnosis not present

## 2021-10-03 DIAGNOSIS — F17200 Nicotine dependence, unspecified, uncomplicated: Secondary | ICD-10-CM | POA: Diagnosis not present

## 2021-10-03 LAB — ECHOCARDIOGRAM COMPLETE
Calc EF: 44.3 %
MV M vel: 4.02 m/s
MV Peak grad: 64.6 mmHg
P 1/2 time: 371 msec
Radius: 0.5 cm
S' Lateral: 4.8 cm
Single Plane A2C EF: 36.6 %
Single Plane A4C EF: 50.2 %

## 2021-10-03 NOTE — Progress Notes (Signed)
  Echocardiogram 2D Echocardiogram has been performed.  Ruben [name] 10/03/2021, 2:58 PM

## 2021-10-04 DIAGNOSIS — D631 Anemia in chronic kidney disease: Secondary | ICD-10-CM | POA: Diagnosis not present

## 2021-10-04 DIAGNOSIS — D509 Iron deficiency anemia, unspecified: Secondary | ICD-10-CM | POA: Diagnosis not present

## 2021-10-04 DIAGNOSIS — N2581 Secondary hyperparathyroidism of renal origin: Secondary | ICD-10-CM | POA: Diagnosis not present

## 2021-10-04 DIAGNOSIS — D689 Coagulation defect, unspecified: Secondary | ICD-10-CM | POA: Diagnosis not present

## 2021-10-04 DIAGNOSIS — Z992 Dependence on renal dialysis: Secondary | ICD-10-CM | POA: Diagnosis not present

## 2021-10-04 DIAGNOSIS — N186 End stage renal disease: Secondary | ICD-10-CM | POA: Diagnosis not present

## 2021-10-07 ENCOUNTER — Telehealth (HOSPITAL_COMMUNITY): Payer: Self-pay

## 2021-10-07 DIAGNOSIS — D631 Anemia in chronic kidney disease: Secondary | ICD-10-CM | POA: Diagnosis not present

## 2021-10-07 DIAGNOSIS — D689 Coagulation defect, unspecified: Secondary | ICD-10-CM | POA: Diagnosis not present

## 2021-10-07 DIAGNOSIS — Z992 Dependence on renal dialysis: Secondary | ICD-10-CM | POA: Diagnosis not present

## 2021-10-07 DIAGNOSIS — D509 Iron deficiency anemia, unspecified: Secondary | ICD-10-CM | POA: Diagnosis not present

## 2021-10-07 DIAGNOSIS — N186 End stage renal disease: Secondary | ICD-10-CM | POA: Diagnosis not present

## 2021-10-07 DIAGNOSIS — N2581 Secondary hyperparathyroidism of renal origin: Secondary | ICD-10-CM | POA: Diagnosis not present

## 2021-10-07 NOTE — Telephone Encounter (Addendum)
Pt aware, agreeable, and verbalized understanding Follow up scheduled   ----- Message from Larey Dresser, MD sent at 10/04/2021  4:13 PM EDT ----- EF 40-45% (slight improvement) but MR looks still significant, moderate-severe.  Will need to see back in office.  Had TEE in 1/23 with moderate MR.  Will likely need repeat TEE, if MR looks worse by that TEE may qualify for Mitraclip.  Make sure he has followup to discuss.

## 2021-10-09 DIAGNOSIS — D689 Coagulation defect, unspecified: Secondary | ICD-10-CM | POA: Diagnosis not present

## 2021-10-09 DIAGNOSIS — Z992 Dependence on renal dialysis: Secondary | ICD-10-CM | POA: Diagnosis not present

## 2021-10-09 DIAGNOSIS — D631 Anemia in chronic kidney disease: Secondary | ICD-10-CM | POA: Diagnosis not present

## 2021-10-09 DIAGNOSIS — D509 Iron deficiency anemia, unspecified: Secondary | ICD-10-CM | POA: Diagnosis not present

## 2021-10-09 DIAGNOSIS — N2581 Secondary hyperparathyroidism of renal origin: Secondary | ICD-10-CM | POA: Diagnosis not present

## 2021-10-09 DIAGNOSIS — N186 End stage renal disease: Secondary | ICD-10-CM | POA: Diagnosis not present

## 2021-10-11 ENCOUNTER — Other Ambulatory Visit (HOSPITAL_COMMUNITY): Payer: Self-pay | Admitting: *Deleted

## 2021-10-11 ENCOUNTER — Ambulatory Visit (HOSPITAL_COMMUNITY)
Admission: RE | Admit: 2021-10-11 | Discharge: 2021-10-11 | Disposition: A | Discharge status: Home / Self Care | Payer: Medicare Other | Source: Ambulatory Visit | Attending: Cardiology | Admitting: Cardiology

## 2021-10-11 ENCOUNTER — Encounter (HOSPITAL_COMMUNITY): Payer: Self-pay | Admitting: Cardiology

## 2021-10-11 VITALS — BP 110/70 | HR 93 | Wt 159.8 lb

## 2021-10-11 DIAGNOSIS — I5022 Chronic systolic (congestive) heart failure: Secondary | ICD-10-CM | POA: Insufficient documentation

## 2021-10-11 DIAGNOSIS — I340 Nonrheumatic mitral (valve) insufficiency: Secondary | ICD-10-CM | POA: Diagnosis not present

## 2021-10-11 DIAGNOSIS — Z87891 Personal history of nicotine dependence: Secondary | ICD-10-CM | POA: Diagnosis not present

## 2021-10-11 DIAGNOSIS — I4891 Unspecified atrial fibrillation: Secondary | ICD-10-CM | POA: Insufficient documentation

## 2021-10-11 DIAGNOSIS — Z7901 Long term (current) use of anticoagulants: Secondary | ICD-10-CM | POA: Diagnosis not present

## 2021-10-11 DIAGNOSIS — I132 Hypertensive heart and chronic kidney disease with heart failure and with stage 5 chronic kidney disease, or end stage renal disease: Secondary | ICD-10-CM | POA: Insufficient documentation

## 2021-10-11 DIAGNOSIS — N189 Chronic kidney disease, unspecified: Secondary | ICD-10-CM | POA: Insufficient documentation

## 2021-10-11 DIAGNOSIS — F419 Anxiety disorder, unspecified: Secondary | ICD-10-CM | POA: Diagnosis not present

## 2021-10-11 DIAGNOSIS — N179 Acute kidney failure, unspecified: Secondary | ICD-10-CM | POA: Diagnosis not present

## 2021-10-11 DIAGNOSIS — I38 Endocarditis, valve unspecified: Secondary | ICD-10-CM | POA: Insufficient documentation

## 2021-10-11 DIAGNOSIS — Z7984 Long term (current) use of oral hypoglycemic drugs: Secondary | ICD-10-CM | POA: Diagnosis not present

## 2021-10-11 DIAGNOSIS — I959 Hypotension, unspecified: Secondary | ICD-10-CM | POA: Diagnosis not present

## 2021-10-11 DIAGNOSIS — D689 Coagulation defect, unspecified: Secondary | ICD-10-CM | POA: Diagnosis not present

## 2021-10-11 DIAGNOSIS — Z7902 Long term (current) use of antithrombotics/antiplatelets: Secondary | ICD-10-CM | POA: Insufficient documentation

## 2021-10-11 DIAGNOSIS — F1021 Alcohol dependence, in remission: Secondary | ICD-10-CM | POA: Diagnosis not present

## 2021-10-11 DIAGNOSIS — I2510 Atherosclerotic heart disease of native coronary artery without angina pectoris: Secondary | ICD-10-CM | POA: Diagnosis not present

## 2021-10-11 DIAGNOSIS — N186 End stage renal disease: Secondary | ICD-10-CM | POA: Insufficient documentation

## 2021-10-11 DIAGNOSIS — D631 Anemia in chronic kidney disease: Secondary | ICD-10-CM | POA: Diagnosis not present

## 2021-10-11 DIAGNOSIS — N2581 Secondary hyperparathyroidism of renal origin: Secondary | ICD-10-CM | POA: Diagnosis not present

## 2021-10-11 DIAGNOSIS — I252 Old myocardial infarction: Secondary | ICD-10-CM | POA: Insufficient documentation

## 2021-10-11 DIAGNOSIS — Z992 Dependence on renal dialysis: Secondary | ICD-10-CM | POA: Diagnosis not present

## 2021-10-11 DIAGNOSIS — G4709 Other insomnia: Secondary | ICD-10-CM | POA: Diagnosis not present

## 2021-10-11 DIAGNOSIS — Z79899 Other long term (current) drug therapy: Secondary | ICD-10-CM | POA: Insufficient documentation

## 2021-10-11 DIAGNOSIS — D509 Iron deficiency anemia, unspecified: Secondary | ICD-10-CM | POA: Diagnosis not present

## 2021-10-11 NOTE — Progress Notes (Signed)
 [facility]ED [facility] CLINIC PROGRESS NOTE  Primary Care: Pahwani, Rinka R, MD HF Cardiologist: Dr. McLean  HPI: Ruben [name] 76 y.o. male with history of HTN, HLD, ESRD, tobacco and alcohol abuse (pint a day of wine formerly), depression.   Admitted 04/29/21 with suspected late presentation MI. Went into AF with RVR and placed on BiPAP. He was initiated on IV amio, heparin gtt, nitro gtt and IV diuretics. Echo with LVEF 35-40%, inferior hypokinesis, RV okay, splay artifact with likely moderate to severe MR. Subsequently developed cardiogenic shock. PICC line placed.  Initial Co-ox low at 47% and started on NE + milrinone. Advanced HF team consulted. Underwent TEE showing EF 35-40%, RV okay, moderate AI, moderate MR (suspect infarct related). R/LHC (initially delayed d/t AKI on CKD): Occluded RCA (culprit), diffuse disease in LAD and Lcx. On 0.25 mil RA mean 16, PA 66/26 (41), PCWP 27, PA 62%, Fick CO 5.04/ CI 2.56.    Seen by CVTS. Not felt to be a surgical candidate. Attempted to wean off milrinone. Remained in AF despite Amio gtt. Underwent TEE/DCCV --> SR. Repeat echo EF 35-40%, RV okay, moderate AI, trivial TR, moderate, likely infarct-related, MR. IV amio weaned off and transitioned to amio 200 mg daily. After procedures completed he was started on Eliquis. Course c/b AKI on CKD, nephrology consulted. Felt to be d/t worsening ATN in setting of shock/episodes of hypotension versus CIN. Placed on CVVHD and later switched to iHD. Had tunneled HD catheter placed. Vascular consulted and placed AVG. Discharged home, weight 165 lbs.  Admitted 2/23 with rectal pain and dysphagia. GI consulted and patient manually disimpacted. Had MRI liver due to elevated LFTs which showed 12 mm benign lesions.   Last visit 5/23, reported issues w/ hypotension w/ HD, limiting HD sessions. Also reported getting "panic attacks" when he goes to start HD.  He had also reported that he had reduced atorvastatin to 40 mg daily due  to diffuse weakness, but had not noticed improvement w/ dose reduction. Midodrine was increased to 15 mg tid, he was given Rx for PRN Xanax and instructed to increase atorvastatin back up to 80 mg daily. Also ordered to get repeat echo.   Echo 6/23 showed EF 40-45% (slight improvement) but MR still significant, mod-severe.   He returns back to clinic today to discuss echo findings and set up for TEE to better assess the mitral valve. Here w/ his daughter. BP better at HD but continues w/ anxiety/panic like attacks. Feels claustrophobic in HD chair. He has not tried taking Xanax prior to HD, instead waits until symptoms start.   From HF standpoint, reports stable NYHA Class II symptoms. No LEE, orthopnea/PND. Denies CP. BP 110/70. Has HD scheduled later this morning.   ECG (personally reviewed): NSR w/ 1st degree AVB, nonspecific T wave changes. 93 bpm   Labs (3/23): LDL 76, HDL 37, LFTs normal  PMH: 1. ESRD 2. H/o ETOH abuse: Quit 12/22.  3. Atrial fibrillation: Paroxysmal, s/p DCCV in 1/23.  4. Mitral regurgitation: Suspect infarct-related.  - TEE (1/23): EF 35-40%, basal to mid inferior and inferolateral AK, RV normal, moderate AI, suspect moderate infarct-related MR.  5. Depression 6. CAD: 12/22 late presentation inferior MI.   - LHC (1/23): Occluded RCA with collaterals, serial 50%/80%/80% mid-distal LCx stenoses, serial 60%/70%/50% proximal to mid LAD stenoses, 70% ostial D1. No good PCI targets and not a CABG candidate.  7. Chronic systolic CHF: TEE (1/23) with EF 35-40%, basal to mid inferior and inferolateral AK,   RV normal, moderate AI, suspect moderate infarct-related MR.  Review of Systems: All systems reviewed and negative except as per HPI.    Current Outpatient Medications  Medication Sig Dispense Refill   acetaminophen (TYLENOL) 650 MG CR tablet Take 650 mg by mouth every 8 (eight) hours as needed for pain.     albuterol (PROVENTIL) (2.5 MG/3ML) 0.083% nebulizer solution  Take 3 mLs (2.5 mg total) by nebulization every 4 (four) hours as needed for wheezing or shortness of breath. 75 mL 2   ALPRAZolam (XANAX) 0.25 MG tablet Take 1 tablet (0.25 mg total) by mouth daily as needed for anxiety (anxiety with dialysis). 30 tablet 1   amiodarone (PACERONE) 200 MG tablet Take 200 mg by mouth daily.     apixaban (ELIQUIS) 5 MG TABS tablet Take 1 tablet (5 mg total) by mouth 2 (two) times daily. 60 tablet 6   atorvastatin (LIPITOR) 40 MG tablet Take 1 tablet (40 mg total) by mouth daily. 30 tablet 11   cholecalciferol (VITAMIN D3) 25 MCG (1000 UNIT) tablet Take 1,000 Units by mouth daily.     diltiazem 2 % GEL Apply 1 application. topically 2 (two) times daily. Apply to anal fissures as needed     hydrOXYzine (ATARAX) 10 MG tablet Take 10 mg by mouth at bedtime.     Lidocaine-Hydrocortisone Ace 3-2.5 % KIT Place 1 application. rectally 2 (two) times daily. As needed     magnesium oxide (MAG-OX) 400 (240 Mg) MG tablet Take 1 tablet by mouth daily.     metoCLOPramide (REGLAN) 5 MG tablet Take 1 tablet (5 mg total) by mouth every 8 (eight) hours as needed for nausea. 6 tablet 0   midodrine (PROAMATINE) 10 MG tablet Take 1.5 tablets (15 mg total) by mouth 3 (three) times daily with meals. 135 tablet 6   mirtazapine (REMERON) 7.5 MG tablet Take 1 tablet (7.5 mg total) by mouth at bedtime. 30 tablet 0   multivitamin (RENA-VIT) TABS tablet Take 1 tablet by mouth at bedtime. 30 tablet 6   nitroGLYCERIN (NITROSTAT) 0.4 MG SL tablet Place 1 tablet (0.4 mg total) under the tongue every 5 (five) minutes as needed for chest pain. 100 tablet 3   ondansetron (ZOFRAN) 4 MG tablet Take 1 tablet (4 mg total) by mouth daily as needed for nausea or vomiting. 30 tablet 1   phenylephrine (,USE FOR PREPARATION-H,) 0.25 % suppository Place 1 suppository rectally as needed for hemorrhoids.     traZODone (DESYREL) 50 MG tablet Take 1 tablet (50 mg total) by mouth at bedtime. 90 tablet 3   No current  facility-administered medications for this encounter.   Allergies  Allergen Reactions   Other Other (See Comments)    Pollen - sneezing, itching/watery eyes   Social History   Socioeconomic History   Marital status: Single    Spouse name: Not on file   Number of children: Not on file   Years of education: Not on file   Highest education level: Not on file  Occupational History   Not on file  Tobacco Use   Smoking status: Former    Packs/day: 1.50    Years: 20.00    Total pack years: 30.00    Types: Cigarettes   Smokeless tobacco: Never   Tobacco comments:    down to 2 packs per week; 3/21 - down to 1 Pack per week  Vaping Use   Vaping Use: Never used  Substance and Sexual Activity   Alcohol use:   No    Alcohol/week: 0.0 standard drinks of alcohol   Drug use: No   Sexual activity: Not on file  Other Topics Concern   Not on file  Social History Narrative   Not on file   Social Determinants of Health   Financial Resource Strain: Medium Risk (05/02/2021)   Overall Financial Resource Strain (CARDIA)    Difficulty of Paying Living Expenses: Somewhat hard  Food Insecurity: No Food Insecurity (05/02/2021)   Hunger Vital Sign    Worried About Running Out of Food in the Last Year: Never true    Ran Out of Food in the Last Year: Never true  Transportation Needs: No Transportation Needs (05/02/2021)   PRAPARE - Transportation    Lack of Transportation (Medical): No    Lack of Transportation (Non-Medical): No  Physical Activity: Not on file  Stress: Not on file  Social Connections: Not on file  Intimate Partner Violence: Not on file   Family History  Problem Relation Age of Onset   Heart attack Brother    Diabetes Brother    Heart disease Brother    Cancer Father    Diabetes Brother    Heart disease Brother    BP 110/70   Pulse 93   Wt 72.5 kg (159 lb 12.8 oz)   SpO2 97%   BMI 22.29 kg/m   Wt Readings from Last 3 Encounters:  10/11/21 72.5 kg (159 lb 12.8  oz)  09/17/21 67.1 kg (148 lb)  07/23/21 62.7 kg (138 lb 3.2 oz)   PHYSICAL EXAM: General:  Well appearing. No respiratory difficulty HEENT: normal Neck: supple. no JVD. Carotids 2+ bilat; no bruits. No lymphadenopathy or thyromegaly appreciated. Cor: PMI nondisplaced. Regular rate & rhythm. 2/6 MR murmur  Lungs: clear Abdomen: soft, nontender, nondistended. No hepatosplenomegaly. No bruits or masses. Good bowel sounds. Extremities: no cyanosis, clubbing, rash, edema + LUE AVF  Neuro: alert & oriented x 3, cranial nerves grossly intact. moves all 4 extremities w/o difficulty. Affect pleasant.   ASSESSMENT & PLAN: 1. CAD: Suspect delayed presentation inferior MI in 12/22.  He was not cathed initially due to AKI and delayed presentation. Eventual LHC with occluded RCA (culprit), diffuse moderate disease in LAD and LCx. No good PCI targets. Not a candidate for CABG. Denies CP  - He is not on ASA given Eliquis for Afib - not on ? blocker given hypotension requiring midodrine  - continue atorvastatin 80 mg nightly (plan to recheck LP + HFTs at next visit 7/23)  2. Chronic systolic CHF: Ischemic cardiomyopathy with infarct-related MR. Echo in 12/22 with EF 35-40%, inferolateral and basal inferior akinesis, moderate-severe eccentric likely infarct-related MR, normal RV.  TEE in 1/23 with EF 35-40%, moderate MR (suspect infarct-related), aortic dilatation measuring 44 mm, moderate AI. Cardiogenic shock requiring milrinone during 12/22-1/23 hospitalization, titrated off.  2D Echo 6/23 EF 40-45%, RV mildly reduced, Mod-severe MR  - NYHA Class II - Now requiring midodrine to maintain BP with HD. Improved after recent increase. Continue Midodrine 15 mg tid  - HD for volume management.  - GDMT limited by CKD and hypotension w/ HD  - Labs followed at HD  3. Valvular heart disease:  MR moderate in severity on TEE in 1/23. Likely infarct related. Repeat Echo 6/23 w/ mod-severe MR, mild-mod AI and mild-mod  TR, LVEF mildly improved to 40-45%.  - Needs TEE to better assess the mitral valve valve. If severe, will send to structural heart clinic for MitraClip consultation - After   careful review of history and examination, the risks and benefits of transesophageal echocardiogram have been explained including risks of esophageal damage, perforation (1:10,000 risk), bleeding, pharyngeal hematoma as well as other potential complications associated with conscious sedation including aspiration, arrhythmia, respiratory failure and death. Alternatives to treatment were discussed, questions were answered. Patient is willing to proceed.  4. Atrial fibrillation: First noted in 12/22, s/p TEE-guided DCCV in 1/23. He is in NSR today  - Continue amiodarone 100 mg daily. Check LFTs and TSH next f/u visit in 4 wks. Will need regular eye exam.  - Continue Eliquis 5 mg bid. 5. ESRD: iHD MWF - c/w midodrine for BP support  6. Smoker: Quit 04/28/21, congratulated on quitting. 7. ETOH abuse: Last drink was 04/28/21, congratulated on quitting.  8. Insomnia: Severe.  continue trazodone 50 mg qhs.  9. Anxiety/panic attacks: Around time of HD. Heels claustrophobic in HD chair. He has not tried taking Xanax prior to HD, instead waits until symptoms start - recommended that he be proactive and take 1-2 hrs prior to HD session   Set up for TEE w/ Dr. McLean 6/29   Keep F/u w/ APP on 7/20.   Ruben Watling, PA-C  10/11/2021 1:32 PM      

## 2021-10-11 NOTE — H&P (View-Only) (Signed)
[facility] PROGRESS NOTE  Primary Care: [name], MD HF Cardiologist: Dr. Aundra Dubin  HPI: Ruben [name] 76 y.o. male with history of HTN, HLD, ESRD, tobacco and alcohol abuse (pint a [name] of wine formerly), depression.   Admitted 04/29/21 with suspected late presentation MI. Went into AF with RVR and placed on BiPAP. He was initiated on IV amio, heparin gtt, nitro gtt and IV diuretics. Echo with LVEF 35-40%, inferior hypokinesis, RV okay, splay artifact with likely moderate to severe MR. Subsequently developed cardiogenic shock. PICC line placed.  Initial Co-ox low at 47% and started on NE + milrinone. Advanced HF team consulted. Underwent TEE showing EF 35-40%, RV okay, moderate AI, moderate MR (suspect infarct related). R/LHC (initially delayed d/t AKI on CKD): Occluded RCA (culprit), diffuse disease in LAD and Lcx. On 0.25 mil RA mean 16, PA 66/26 (41), PCWP 27, PA 62%, Fick CO 5.04/ CI 2.56.    Seen by CVTS. Not felt to be a surgical candidate. Attempted to wean off milrinone. Remained in AF despite Amio gtt. Underwent TEE/DCCV --> SR. Repeat echo EF 35-40%, RV okay, moderate AI, trivial TR, moderate, likely infarct-related, MR. IV amio weaned off and transitioned to amio 200 mg daily. After procedures completed he was started on Eliquis. Course c/b AKI on CKD, nephrology consulted. Felt to be d/t worsening ATN in setting of shock/episodes of hypotension versus CIN. Placed on CVVHD and later switched to iHD. Had tunneled HD catheter placed. Vascular consulted and placed AVG. Discharged home, weight 165 lbs.  Admitted 2/23 with rectal pain and dysphagia. GI consulted and patient manually disimpacted. Had MRI liver due to elevated LFTs which showed 12 mm benign lesions.   Last visit 5/23, reported issues w/ hypotension w/ HD, limiting HD sessions. Also reported getting "panic attacks" when he goes to start HD.  He had also reported that he had reduced atorvastatin to 40 mg daily due  to diffuse weakness, but had not noticed improvement w/ dose reduction. Midodrine was increased to 15 mg tid, he was given Rx for PRN Xanax and instructed to increase atorvastatin back up to 80 mg daily. Also ordered to get repeat echo.   Echo 6/23 showed EF 40-45% (slight improvement) but MR still significant, mod-severe.   He returns back to clinic today to discuss echo findings and set up for TEE to better assess the mitral valve. Here w/ his daughter. BP better at HD but continues w/ anxiety/panic like attacks. Feels claustrophobic in HD chair. He has not tried taking Xanax prior to HD, instead waits until symptoms start.   From HF standpoint, reports stable NYHA Class II symptoms. No LEE, orthopnea/PND. Denies CP. BP 110/70. Has HD scheduled later this morning.   ECG (personally reviewed): NSR w/ 1st degree AVB, nonspecific T wave changes. 93 bpm   Labs (3/23): LDL 76, HDL 37, LFTs normal  PMH: 1. ESRD 2. H/o ETOH abuse: Quit 12/22.  3. Atrial fibrillation: Paroxysmal, s/p DCCV in 1/23.  4. Mitral regurgitation: Suspect infarct-related.  - TEE (1/23): EF 35-40%, basal to mid inferior and inferolateral AK, RV normal, moderate AI, suspect moderate infarct-related MR.  5. Depression 6. CAD: 12/22 late presentation inferior MI.   - LHC (1/23): Occluded RCA with collaterals, serial 50%/80%/80% mid-distal LCx stenoses, serial 60%/70%/50% proximal to mid LAD stenoses, 70% ostial D1. No good PCI targets and not a CABG candidate.  7. Chronic systolic CHF: TEE (7/10) with EF 35-40%, basal to mid inferior and inferolateral AK,  RV normal, moderate AI, suspect moderate infarct-related MR.  Review of Systems: All systems reviewed and negative except as per HPI.    Current Outpatient Medications  Medication Sig Dispense Refill   acetaminophen (TYLENOL) 650 MG CR tablet Take 650 mg by mouth every 8 (eight) hours as needed for pain.     albuterol (PROVENTIL) (2.5 MG/3ML) 0.083% nebulizer solution  Take 3 mLs (2.5 mg total) by nebulization every 4 (four) hours as needed for wheezing or shortness of breath. 75 mL 2   ALPRAZolam (XANAX) 0.25 MG tablet Take 1 tablet (0.25 mg total) by mouth daily as needed for anxiety (anxiety with dialysis). 30 tablet 1   amiodarone (PACERONE) 200 MG tablet Take 200 mg by mouth daily.     apixaban (ELIQUIS) 5 MG TABS tablet Take 1 tablet (5 mg total) by mouth 2 (two) times daily. 60 tablet 6   atorvastatin (LIPITOR) 40 MG tablet Take 1 tablet (40 mg total) by mouth daily. 30 tablet 11   cholecalciferol (VITAMIN D3) 25 MCG (1000 UNIT) tablet Take 1,000 Units by mouth daily.     diltiazem 2 % GEL Apply 1 application. topically 2 (two) times daily. Apply to anal fissures as needed     hydrOXYzine (ATARAX) 10 MG tablet Take 10 mg by mouth at bedtime.     Lidocaine-Hydrocortisone Ace 3-2.5 % KIT Place 1 application. rectally 2 (two) times daily. As needed     magnesium oxide (MAG-OX) 400 (240 Mg) MG tablet Take 1 tablet by mouth daily.     metoCLOPramide (REGLAN) 5 MG tablet Take 1 tablet (5 mg total) by mouth every 8 (eight) hours as needed for nausea. 6 tablet 0   midodrine (PROAMATINE) 10 MG tablet Take 1.5 tablets (15 mg total) by mouth 3 (three) times daily with meals. 135 tablet 6   mirtazapine (REMERON) 7.5 MG tablet Take 1 tablet (7.5 mg total) by mouth at bedtime. 30 tablet 0   multivitamin (RENA-VIT) TABS tablet Take 1 tablet by mouth at bedtime. 30 tablet 6   nitroGLYCERIN (NITROSTAT) 0.4 MG SL tablet Place 1 tablet (0.4 mg total) under the tongue every 5 (five) minutes as needed for chest pain. 100 tablet 3   ondansetron (ZOFRAN) 4 MG tablet Take 1 tablet (4 mg total) by mouth daily as needed for nausea or vomiting. 30 tablet 1   phenylephrine (,USE FOR PREPARATION-H,) 0.25 % suppository Place 1 suppository rectally as needed for hemorrhoids.     traZODone (DESYREL) 50 MG tablet Take 1 tablet (50 mg total) by mouth at bedtime. 90 tablet 3   No current  facility-administered medications for this encounter.   Allergies  Allergen Reactions   Other Other (See Comments)    Pollen - sneezing, itching/watery eyes   Social History   Socioeconomic History   Marital status: Single    Spouse name: Not on file   Number of children: Not on file   Years of education: Not on file   Highest education level: Not on file  Occupational History   Not on file  Tobacco Use   Smoking status: Former    Packs/[name]: 1.50    Years: 20.00    Total pack years: 30.00    Types: Cigarettes   Smokeless tobacco: Never   Tobacco comments:    down to 2 packs per week; 3/21 - down to 1 Pack per week  Vaping Use   Vaping Use: Never used  Substance and Sexual Activity   Alcohol use:  No    Alcohol/week: 0.0 standard drinks of alcohol   Drug use: No   Sexual activity: Not on file  Other Topics Concern   Not on file  Social History Narrative   Not on file   Social Determinants of Health   Financial Resource Strain: Medium Risk (05/02/2021)   Overall Financial Resource Strain (CARDIA)    Difficulty of Paying Living Expenses: Somewhat hard  Food Insecurity: No Food Insecurity (05/02/2021)   Hunger Vital Sign    Worried About Running Out of Food in the Last Year: Never true    Ran Out of Food in the Last Year: Never true  Transportation Needs: No Transportation Needs (05/02/2021)   PRAPARE - Hydrologist (Medical): No    Lack of Transportation (Non-Medical): No  Physical Activity: Not on file  Stress: Not on file  Social Connections: Not on file  Intimate Partner Violence: Not on file   Family History  Problem Relation Age of Onset   Heart attack Brother    Diabetes Brother    Heart disease Brother    Cancer Father    Diabetes Brother    Heart disease Brother    BP 110/70   Pulse 93   Wt 72.5 kg (159 lb 12.8 oz)   SpO2 97%   BMI 22.29 kg/m   Wt Readings from Last 3 Encounters:  10/11/21 72.5 kg (159 lb 12.8  oz)  09/17/21 67.1 kg (148 lb)  07/23/21 62.7 kg (138 lb 3.2 oz)   PHYSICAL EXAM: General:  Well appearing. No respiratory difficulty HEENT: normal Neck: supple. no JVD. Carotids 2+ bilat; no bruits. No lymphadenopathy or thyromegaly appreciated. Cor: PMI nondisplaced. Regular rate & rhythm. 2/6 MR murmur  Lungs: clear Abdomen: soft, nontender, nondistended. No hepatosplenomegaly. No bruits or masses. Good bowel sounds. Extremities: no cyanosis, clubbing, rash, edema + LUE AVF  Neuro: alert & oriented x 3, cranial nerves grossly intact. moves all 4 extremities w/o difficulty. Affect pleasant.   ASSESSMENT & PLAN: 1. CAD: Suspect delayed presentation inferior MI in 12/22.  He was not cathed initially due to AKI and delayed presentation. Eventual LHC with occluded RCA (culprit), diffuse moderate disease in LAD and LCx. No good PCI targets. Not a candidate for CABG. Denies CP  - He is not on ASA given Eliquis for Afib - not on ? blocker given hypotension requiring midodrine  - continue atorvastatin 80 mg nightly (plan to recheck LP + HFTs at next visit 7/23)  2. Chronic systolic CHF: Ischemic cardiomyopathy with infarct-related MR. Echo in 12/22 with EF 35-40%, inferolateral and basal inferior akinesis, moderate-severe eccentric likely infarct-related MR, normal RV.  TEE in 1/23 with EF 35-40%, moderate MR (suspect infarct-related), aortic dilatation measuring 44 mm, moderate AI. Cardiogenic shock requiring milrinone during 12/22-1/23 hospitalization, titrated off.  2D Echo 6/23 EF 40-45%, RV mildly reduced, Mod-severe MR  - NYHA Class II - Now requiring midodrine to maintain BP with HD. Improved after recent increase. Continue Midodrine 15 mg tid  - HD for volume management.  - GDMT limited by CKD and hypotension w/ HD  - Labs followed at HD  3. Valvular heart disease:  MR moderate in severity on TEE in 1/23. Likely infarct related. Repeat Echo 6/23 w/ mod-severe MR, mild-mod AI and mild-mod  TR, LVEF mildly improved to 40-45%.  - Needs TEE to better assess the mitral valve valve. If severe, will send to structural heart clinic for MitraClip consultation - After  careful review of history and examination, the risks and benefits of transesophageal echocardiogram have been explained including risks of esophageal damage, perforation (1:10,000 risk), bleeding, pharyngeal hematoma as well as other potential complications associated with conscious sedation including aspiration, arrhythmia, respiratory failure and death. Alternatives to treatment were discussed, questions were answered. Patient is willing to proceed.  4. Atrial fibrillation: First noted in 12/22, s/p TEE-guided DCCV in 1/23. He is in NSR today  - Continue amiodarone 100 mg daily. Check LFTs and TSH next f/u visit in 4 wks. Will need regular eye exam.  - Continue Eliquis 5 mg bid. 5. ESRD: iHD MWF - c/w midodrine for BP support  6. Smoker: Quit 04/28/21, congratulated on quitting. 7. ETOH abuse: Last drink was 04/28/21, congratulated on quitting.  8. Insomnia: Severe.  continue trazodone 50 mg qhs.  9. Anxiety/panic attacks: Around time of HD. Heels claustrophobic in HD chair. He has not tried taking Xanax prior to HD, instead waits until symptoms start - recommended that he be proactive and take 1-2 hrs prior to HD session   Set up for TEE w/ Dr. Aundra Dubin 6/29   Keep F/u w/ APP on 7/20.   Lyda Jester, PA-C  10/11/2021 1:32 PM

## 2021-10-11 NOTE — Patient Instructions (Signed)
Medication Changes:  None, continue current medications  Lab Work:  None  Testing/Procedures:  Your physician has requested that you have a TEE. During a TEE, sound waves are used to create images of your heart. It provides your doctor with information about the size and shape of your heart and how well your heart's chambers and valves are working. In this test, a transducer is attached to the end of a flexible tube that's guided down your throat and into your esophagus (the tube leading from you mouth to your stomach) to get a more detailed image of your heart. You are not awake for the procedure. Please see the instruction sheet given to you today. For further information please visit [email or web address].  Referrals:  None  Special Instructions // Education:  TEE INSTRUCTIONS:  You are scheduled for a TEE on THURSDAY 10/31/21 with Dr. Aundra Dubin.    Please arrive at the [facility] (Main Entrance A) at [facility]: [address] [place], [place] [address] at 7:30 am  DIET: Nothing to eat or drink after midnight except a sip of water with medications (see medication instructions below)  Medication Instructions: CAN TAKE ALL MEDS WITH WATER AS NORMAL  You must have a responsible person to drive you home and stay in the waiting area during your procedure. Failure to do so could result in cancellation.  Bring your insurance cards.  *Special Note: Every effort is made to have your procedure done on time. Occasionally there are emergencies that occur at the hospital that may cause delays. Please be patient if a delay does occur.    Follow-Up in: AS SCHEDULED 11/21/21  At the [facility], you and your health needs are our priority. We have a designated team specialized in the treatment of Heart Failure. This Care Team includes your primary Heart Failure Specialized Cardiologist (physician), Advanced Practice Providers (APPs- Physician Assistants and Nurse  Practitioners), and Pharmacist who all work together to provide you with the care you need, when you need it.   You may see any of the following providers on your designated Care Team at your next follow up:  Dr Glori Bickers Dr Haynes Kerns, NP Lyda Jester, [place] [facility] [place], [place] Audry Riles, PharmD   Please be sure to bring in all your medications bottles to every appointment.   Need to Contact us:  If you have any questions or concerns before your next appointment please send [place] a message through [facility] or call our office at [phone].    TO LEAVE A MESSAGE FOR THE NURSE SELECT OPTION 2, PLEASE LEAVE A MESSAGE INCLUDING: YOUR NAME DATE OF BIRTH CALL BACK NUMBER REASON FOR CALL**this is important as we prioritize the call backs  YOU WILL RECEIVE A CALL BACK THE SAME DAY AS LONG AS YOU CALL BEFORE 4:00 PM

## 2021-10-14 DIAGNOSIS — D631 Anemia in chronic kidney disease: Secondary | ICD-10-CM | POA: Diagnosis not present

## 2021-10-14 DIAGNOSIS — N2581 Secondary hyperparathyroidism of renal origin: Secondary | ICD-10-CM | POA: Diagnosis not present

## 2021-10-14 DIAGNOSIS — D689 Coagulation defect, unspecified: Secondary | ICD-10-CM | POA: Diagnosis not present

## 2021-10-14 DIAGNOSIS — D509 Iron deficiency anemia, unspecified: Secondary | ICD-10-CM | POA: Diagnosis not present

## 2021-10-14 DIAGNOSIS — N186 End stage renal disease: Secondary | ICD-10-CM | POA: Diagnosis not present

## 2021-10-14 DIAGNOSIS — Z992 Dependence on renal dialysis: Secondary | ICD-10-CM | POA: Diagnosis not present

## 2021-10-15 DIAGNOSIS — I5022 Chronic systolic (congestive) heart failure: Secondary | ICD-10-CM | POA: Diagnosis not present

## 2021-10-15 DIAGNOSIS — R319 Hematuria, unspecified: Secondary | ICD-10-CM | POA: Diagnosis not present

## 2021-10-15 DIAGNOSIS — F5101 Primary insomnia: Secondary | ICD-10-CM | POA: Diagnosis not present

## 2021-10-15 DIAGNOSIS — Z992 Dependence on renal dialysis: Secondary | ICD-10-CM | POA: Diagnosis not present

## 2021-10-15 DIAGNOSIS — I480 Paroxysmal atrial fibrillation: Secondary | ICD-10-CM | POA: Diagnosis not present

## 2021-10-15 DIAGNOSIS — I38 Endocarditis, valve unspecified: Secondary | ICD-10-CM | POA: Diagnosis not present

## 2021-10-16 DIAGNOSIS — Z992 Dependence on renal dialysis: Secondary | ICD-10-CM | POA: Diagnosis not present

## 2021-10-16 DIAGNOSIS — D689 Coagulation defect, unspecified: Secondary | ICD-10-CM | POA: Diagnosis not present

## 2021-10-16 DIAGNOSIS — D631 Anemia in chronic kidney disease: Secondary | ICD-10-CM | POA: Diagnosis not present

## 2021-10-16 DIAGNOSIS — N186 End stage renal disease: Secondary | ICD-10-CM | POA: Diagnosis not present

## 2021-10-16 DIAGNOSIS — N2581 Secondary hyperparathyroidism of renal origin: Secondary | ICD-10-CM | POA: Diagnosis not present

## 2021-10-16 DIAGNOSIS — D509 Iron deficiency anemia, unspecified: Secondary | ICD-10-CM | POA: Diagnosis not present

## 2021-10-18 DIAGNOSIS — N2581 Secondary hyperparathyroidism of renal origin: Secondary | ICD-10-CM | POA: Diagnosis not present

## 2021-10-18 DIAGNOSIS — N186 End stage renal disease: Secondary | ICD-10-CM | POA: Diagnosis not present

## 2021-10-18 DIAGNOSIS — D631 Anemia in chronic kidney disease: Secondary | ICD-10-CM | POA: Diagnosis not present

## 2021-10-18 DIAGNOSIS — D689 Coagulation defect, unspecified: Secondary | ICD-10-CM | POA: Diagnosis not present

## 2021-10-18 DIAGNOSIS — D509 Iron deficiency anemia, unspecified: Secondary | ICD-10-CM | POA: Diagnosis not present

## 2021-10-18 DIAGNOSIS — Z992 Dependence on renal dialysis: Secondary | ICD-10-CM | POA: Diagnosis not present

## 2021-10-21 DIAGNOSIS — D509 Iron deficiency anemia, unspecified: Secondary | ICD-10-CM | POA: Diagnosis not present

## 2021-10-21 DIAGNOSIS — N186 End stage renal disease: Secondary | ICD-10-CM | POA: Diagnosis not present

## 2021-10-21 DIAGNOSIS — D689 Coagulation defect, unspecified: Secondary | ICD-10-CM | POA: Diagnosis not present

## 2021-10-21 DIAGNOSIS — N2581 Secondary hyperparathyroidism of renal origin: Secondary | ICD-10-CM | POA: Diagnosis not present

## 2021-10-21 DIAGNOSIS — D631 Anemia in chronic kidney disease: Secondary | ICD-10-CM | POA: Diagnosis not present

## 2021-10-21 DIAGNOSIS — Z992 Dependence on renal dialysis: Secondary | ICD-10-CM | POA: Diagnosis not present

## 2021-10-22 DIAGNOSIS — R310 Gross hematuria: Secondary | ICD-10-CM | POA: Diagnosis not present

## 2021-10-23 DIAGNOSIS — D689 Coagulation defect, unspecified: Secondary | ICD-10-CM | POA: Diagnosis not present

## 2021-10-23 DIAGNOSIS — Z992 Dependence on renal dialysis: Secondary | ICD-10-CM | POA: Diagnosis not present

## 2021-10-23 DIAGNOSIS — D631 Anemia in chronic kidney disease: Secondary | ICD-10-CM | POA: Diagnosis not present

## 2021-10-23 DIAGNOSIS — D509 Iron deficiency anemia, unspecified: Secondary | ICD-10-CM | POA: Diagnosis not present

## 2021-10-23 DIAGNOSIS — E43 Unspecified severe protein-calorie malnutrition: Secondary | ICD-10-CM | POA: Diagnosis not present

## 2021-10-23 DIAGNOSIS — I5021 Acute systolic (congestive) heart failure: Secondary | ICD-10-CM | POA: Diagnosis not present

## 2021-10-23 DIAGNOSIS — N2581 Secondary hyperparathyroidism of renal origin: Secondary | ICD-10-CM | POA: Diagnosis not present

## 2021-10-23 DIAGNOSIS — N186 End stage renal disease: Secondary | ICD-10-CM | POA: Diagnosis not present

## 2021-10-24 ENCOUNTER — Encounter (HOSPITAL_COMMUNITY): Payer: Self-pay | Admitting: Cardiology

## 2021-10-25 DIAGNOSIS — D689 Coagulation defect, unspecified: Secondary | ICD-10-CM | POA: Diagnosis not present

## 2021-10-25 DIAGNOSIS — D509 Iron deficiency anemia, unspecified: Secondary | ICD-10-CM | POA: Diagnosis not present

## 2021-10-25 DIAGNOSIS — D631 Anemia in chronic kidney disease: Secondary | ICD-10-CM | POA: Diagnosis not present

## 2021-10-25 DIAGNOSIS — N2581 Secondary hyperparathyroidism of renal origin: Secondary | ICD-10-CM | POA: Diagnosis not present

## 2021-10-25 DIAGNOSIS — N186 End stage renal disease: Secondary | ICD-10-CM | POA: Diagnosis not present

## 2021-10-25 DIAGNOSIS — Z992 Dependence on renal dialysis: Secondary | ICD-10-CM | POA: Diagnosis not present

## 2021-10-26 DIAGNOSIS — E43 Unspecified severe protein-calorie malnutrition: Secondary | ICD-10-CM | POA: Diagnosis not present

## 2021-10-26 DIAGNOSIS — I5021 Acute systolic (congestive) heart failure: Secondary | ICD-10-CM | POA: Diagnosis not present

## 2021-10-28 ENCOUNTER — Other Ambulatory Visit (HOSPITAL_COMMUNITY): Payer: Self-pay

## 2021-10-28 DIAGNOSIS — D631 Anemia in chronic kidney disease: Secondary | ICD-10-CM | POA: Diagnosis not present

## 2021-10-28 DIAGNOSIS — Z992 Dependence on renal dialysis: Secondary | ICD-10-CM | POA: Diagnosis not present

## 2021-10-28 DIAGNOSIS — N2581 Secondary hyperparathyroidism of renal origin: Secondary | ICD-10-CM | POA: Diagnosis not present

## 2021-10-28 DIAGNOSIS — D689 Coagulation defect, unspecified: Secondary | ICD-10-CM | POA: Diagnosis not present

## 2021-10-28 DIAGNOSIS — D509 Iron deficiency anemia, unspecified: Secondary | ICD-10-CM | POA: Diagnosis not present

## 2021-10-28 DIAGNOSIS — N186 End stage renal disease: Secondary | ICD-10-CM | POA: Diagnosis not present

## 2021-10-28 MED ORDER — MIDODRINE HCL 10 MG PO TABS: 15.0000 mg | ORAL_TABLET | Freq: Three times a day (TID) | ORAL | 6 refills | Status: DC

## 2021-10-29 DIAGNOSIS — I480 Paroxysmal atrial fibrillation: Secondary | ICD-10-CM | POA: Diagnosis not present

## 2021-10-29 DIAGNOSIS — H6123 Impacted cerumen, bilateral: Secondary | ICD-10-CM | POA: Diagnosis not present

## 2021-10-29 DIAGNOSIS — Z992 Dependence on renal dialysis: Secondary | ICD-10-CM | POA: Diagnosis not present

## 2021-10-29 DIAGNOSIS — E7800 Pure hypercholesterolemia, unspecified: Secondary | ICD-10-CM | POA: Diagnosis not present

## 2021-10-29 DIAGNOSIS — I509 Heart failure, unspecified: Secondary | ICD-10-CM | POA: Diagnosis not present

## 2021-10-29 DIAGNOSIS — N186 End stage renal disease: Secondary | ICD-10-CM | POA: Diagnosis not present

## 2021-10-29 DIAGNOSIS — E43 Unspecified severe protein-calorie malnutrition: Secondary | ICD-10-CM | POA: Diagnosis not present

## 2021-10-29 DIAGNOSIS — I5021 Acute systolic (congestive) heart failure: Secondary | ICD-10-CM | POA: Diagnosis not present

## 2021-10-29 DIAGNOSIS — R040 Epistaxis: Secondary | ICD-10-CM | POA: Diagnosis not present

## 2021-10-29 DIAGNOSIS — F5101 Primary insomnia: Secondary | ICD-10-CM | POA: Diagnosis not present

## 2021-10-29 DIAGNOSIS — Z7901 Long term (current) use of anticoagulants: Secondary | ICD-10-CM | POA: Diagnosis not present

## 2021-10-30 ENCOUNTER — Telehealth (HOSPITAL_COMMUNITY): Payer: Self-pay

## 2021-10-30 DIAGNOSIS — Z992 Dependence on renal dialysis: Secondary | ICD-10-CM | POA: Diagnosis not present

## 2021-10-30 DIAGNOSIS — N186 End stage renal disease: Secondary | ICD-10-CM | POA: Diagnosis not present

## 2021-10-30 DIAGNOSIS — N2581 Secondary hyperparathyroidism of renal origin: Secondary | ICD-10-CM | POA: Diagnosis not present

## 2021-10-30 DIAGNOSIS — D631 Anemia in chronic kidney disease: Secondary | ICD-10-CM | POA: Diagnosis not present

## 2021-10-30 DIAGNOSIS — D689 Coagulation defect, unspecified: Secondary | ICD-10-CM | POA: Diagnosis not present

## 2021-10-30 DIAGNOSIS — D509 Iron deficiency anemia, unspecified: Secondary | ICD-10-CM | POA: Diagnosis not present

## 2021-10-30 NOTE — Telephone Encounter (Signed)
Spoke with patient's daughter about TEE scheduled for tomorrow. Aware of nothing to eat or drink after midnight. Has transportation to and from procedure.

## 2021-10-31 ENCOUNTER — Ambulatory Visit (HOSPITAL_COMMUNITY): Payer: Medicare Other | Admitting: Certified Registered Nurse Anesthetist

## 2021-10-31 ENCOUNTER — Ambulatory Visit (HOSPITAL_BASED_OUTPATIENT_CLINIC_OR_DEPARTMENT_OTHER): Payer: Medicare Other | Admitting: Certified Registered Nurse Anesthetist

## 2021-10-31 ENCOUNTER — Encounter (HOSPITAL_COMMUNITY): Payer: Self-pay | Admitting: Cardiology

## 2021-10-31 ENCOUNTER — Other Ambulatory Visit: Payer: Self-pay

## 2021-10-31 ENCOUNTER — Ambulatory Visit (HOSPITAL_COMMUNITY)
Admission: RE | Admit: 2021-10-31 | Discharge: 2021-10-31 | Disposition: A | Discharge status: Home / Self Care | Payer: Medicare Other | Source: Ambulatory Visit | Attending: Cardiology | Admitting: Cardiology

## 2021-10-31 ENCOUNTER — Encounter (HOSPITAL_COMMUNITY)
Admission: RE | Disposition: A | Discharge status: Home / Self Care | Payer: Self-pay | Source: Ambulatory Visit | Attending: Cardiology

## 2021-10-31 ENCOUNTER — Ambulatory Visit (HOSPITAL_BASED_OUTPATIENT_CLINIC_OR_DEPARTMENT_OTHER)
Admission: RE | Admit: 2021-10-31 | Discharge: 2021-10-31 | Disposition: A | Discharge status: Home / Self Care | Payer: Medicare Other | Source: Ambulatory Visit | Attending: Cardiology | Admitting: Cardiology

## 2021-10-31 DIAGNOSIS — Z79899 Other long term (current) drug therapy: Secondary | ICD-10-CM | POA: Insufficient documentation

## 2021-10-31 DIAGNOSIS — Z87891 Personal history of nicotine dependence: Secondary | ICD-10-CM | POA: Diagnosis not present

## 2021-10-31 DIAGNOSIS — I340 Nonrheumatic mitral (valve) insufficiency: Secondary | ICD-10-CM

## 2021-10-31 DIAGNOSIS — Z7901 Long term (current) use of anticoagulants: Secondary | ICD-10-CM | POA: Diagnosis not present

## 2021-10-31 DIAGNOSIS — I132 Hypertensive heart and chronic kidney disease with heart failure and with stage 5 chronic kidney disease, or end stage renal disease: Secondary | ICD-10-CM | POA: Diagnosis not present

## 2021-10-31 DIAGNOSIS — F32A Depression, unspecified: Secondary | ICD-10-CM | POA: Insufficient documentation

## 2021-10-31 DIAGNOSIS — I252 Old myocardial infarction: Secondary | ICD-10-CM | POA: Insufficient documentation

## 2021-10-31 DIAGNOSIS — I2510 Atherosclerotic heart disease of native coronary artery without angina pectoris: Secondary | ICD-10-CM

## 2021-10-31 DIAGNOSIS — Z992 Dependence on renal dialysis: Secondary | ICD-10-CM | POA: Insufficient documentation

## 2021-10-31 DIAGNOSIS — I088 Other rheumatic multiple valve diseases: Secondary | ICD-10-CM | POA: Diagnosis not present

## 2021-10-31 DIAGNOSIS — E785 Hyperlipidemia, unspecified: Secondary | ICD-10-CM | POA: Insufficient documentation

## 2021-10-31 DIAGNOSIS — F17210 Nicotine dependence, cigarettes, uncomplicated: Secondary | ICD-10-CM | POA: Diagnosis not present

## 2021-10-31 DIAGNOSIS — G4700 Insomnia, unspecified: Secondary | ICD-10-CM | POA: Insufficient documentation

## 2021-10-31 DIAGNOSIS — I083 Combined rheumatic disorders of mitral, aortic and tricuspid valves: Secondary | ICD-10-CM | POA: Insufficient documentation

## 2021-10-31 DIAGNOSIS — N186 End stage renal disease: Secondary | ICD-10-CM | POA: Diagnosis not present

## 2021-10-31 DIAGNOSIS — I5022 Chronic systolic (congestive) heart failure: Secondary | ICD-10-CM | POA: Insufficient documentation

## 2021-10-31 DIAGNOSIS — I509 Heart failure, unspecified: Secondary | ICD-10-CM

## 2021-10-31 HISTORY — PX: TEE WITHOUT CARDIOVERSION: SHX5443

## 2021-10-31 LAB — ECHO TEE
MV M vel: 3.04 m/s
MV Peak grad: 37 mmHg
P 1/2 time: 344 msec
Radius: 0.6 cm

## 2021-10-31 LAB — POCT I-STAT, CHEM 8
BUN: 18 mg/dL (ref 8–23)
Calcium, Ion: 1.08 mmol/L — ABNORMAL LOW (ref 1.15–1.40)
Chloride: 97 mmol/L — ABNORMAL LOW (ref 98–111)
Creatinine, Ser: 5.7 mg/dL — ABNORMAL HIGH (ref 0.61–1.24)
Glucose, Bld: 105 mg/dL — ABNORMAL HIGH (ref 70–99)
HCT: 37 % — ABNORMAL LOW (ref 39.0–52.0)
Hemoglobin: 12.6 g/dL — ABNORMAL LOW (ref 13.0–17.0)
Potassium: 4 mmol/L (ref 3.5–5.1)
Sodium: 139 mmol/L (ref 135–145)
TCO2: 30 mmol/L (ref 22–32)

## 2021-10-31 SURGERY — ECHOCARDIOGRAM, TRANSESOPHAGEAL
Anesthesia: Monitor Anesthesia Care

## 2021-10-31 MED ORDER — LIDOCAINE 2% (20 MG/ML) 5 ML SYRINGE
INTRAMUSCULAR | Status: DC | PRN
  Administered 2021-10-31: 40 mg via INTRAVENOUS

## 2021-10-31 MED ORDER — SODIUM CHLORIDE 0.9 % IV SOLN: INTRAVENOUS | Status: DC

## 2021-10-31 MED ORDER — PHENYLEPHRINE HCL-NACL 20-0.9 MG/250ML-% IV SOLN
INTRAVENOUS | Status: DC | PRN
  Administered 2021-10-31: 25 ug/min via INTRAVENOUS

## 2021-10-31 MED ORDER — PROPOFOL 10 MG/ML IV BOLUS
INTRAVENOUS | Status: DC | PRN
  Administered 2021-10-31: 20 mg via INTRAVENOUS

## 2021-10-31 MED ORDER — LIDOCAINE VISCOUS HCL 2 % MT SOLN
OROMUCOSAL | Status: DC | PRN
  Administered 2021-10-31: 20 mL via OROMUCOSAL

## 2021-10-31 MED ORDER — GLYCOPYRROLATE 0.2 MG/ML IJ SOLN
INTRAMUSCULAR | Status: DC | PRN
  Administered 2021-10-31: .2 mg via INTRAVENOUS

## 2021-10-31 MED ORDER — LIDOCAINE VISCOUS HCL 2 % MT SOLN
OROMUCOSAL | Status: AC
  Filled 2021-10-31: qty 15

## 2021-10-31 MED ORDER — PROPOFOL 500 MG/50ML IV EMUL
INTRAVENOUS | Status: DC | PRN
  Administered 2021-10-31: 75 ug/kg/min via INTRAVENOUS

## 2021-10-31 NOTE — Anesthesia Preprocedure Evaluation (Signed)
Anesthesia Evaluation  Patient identified by MRN, date of birth, ID band Patient awake    Reviewed: Allergy & Precautions, H&P , NPO status , Patient's Chart, lab work & pertinent test results  Airway Mallampati: II   Neck ROM: full    Dental   Pulmonary former smoker,    breath sounds clear to auscultation       Cardiovascular hypertension, + CAD and +CHF  + Valvular Problems/Murmurs MR  Rhythm:regular Rate:Normal     Neuro/Psych PSYCHIATRIC DISORDERS Anxiety    GI/Hepatic (+)     substance abuse  alcohol use,   Endo/Other    Renal/GU ESRF and DialysisRenal disease     Musculoskeletal  (+) Arthritis ,   Abdominal   Peds  Hematology   Anesthesia Other Findings   Reproductive/Obstetrics                             Anesthesia Physical Anesthesia Plan  ASA: 3  Anesthesia Plan: MAC   Post-op Pain Management:    Induction: Intravenous  PONV Risk Score and Plan: 1 and Propofol infusion and Treatment may vary due to age or medical condition  Airway Management Planned: Nasal Cannula  Additional Equipment:   Intra-op Plan:   Post-operative Plan: Extubation in OR  Informed Consent: I have reviewed the patients History and Physical, chart, labs and discussed the procedure including the risks, benefits and alternatives for the proposed anesthesia with the patient or authorized representative who has indicated his/her understanding and acceptance.     Dental advisory given  Plan Discussed with: CRNA, Anesthesiologist and Surgeon  Anesthesia Plan Comments:         Anesthesia Quick Evaluation

## 2021-10-31 NOTE — Anesthesia Procedure Notes (Signed)
Procedure Name: MAC Date/Time: 10/31/2021 9:00 AM  Performed by: Lowella Dell, CRNAPre-anesthesia Checklist: Patient identified, Emergency Drugs available, Suction available, Patient being monitored and Timeout performed Patient Re-evaluated:Patient Re-evaluated prior to induction Oxygen Delivery Method: Nasal cannula Placement Confirmation: positive ETCO2 Dental Injury: Teeth and Oropharynx as per pre-operative assessment

## 2021-10-31 NOTE — CV Procedure (Signed)
Procedure: TEE  Sedation: Per anesthesiology  Indication: Mitral regurgitation.   Findings: Please see formal echo report in Epic.   No complications.   Loralie Champagne 10/31/2021 10:01 AM

## 2021-10-31 NOTE — Discharge Instructions (Signed)

## 2021-10-31 NOTE — Interval H&P Note (Signed)
History and Physical Interval Note:  10/31/2021 8:56 AM  Ruben [name]  has presented today for surgery, with the diagnosis of mitral regurgitation.  The various methods of treatment have been discussed with the patient and family. After consideration of risks, benefits and other options for treatment, the patient has consented to  Procedure(s): TRANSESOPHAGEAL ECHOCARDIOGRAM (TEE) (N/A) as a surgical intervention.  The patient's history has been reviewed, patient examined, no change in status, stable for surgery.  I have reviewed the patient's chart and labs.  Questions were answered to the patient's satisfaction.     Angelo Prindle [employer]

## 2021-10-31 NOTE — Transfer of Care (Signed)
Immediate Anesthesia Transfer of Care Note  Patient: Ruben [name]  Procedure(s) Performed: TRANSESOPHAGEAL ECHOCARDIOGRAM (TEE)  Patient Location: PACU and Endoscopy Unit  Anesthesia Type:MAC  Level of Consciousness: drowsy and patient cooperative  Airway & Oxygen Therapy: Patient Spontanous Breathing and Patient connected to nasal cannula oxygen  Post-op Assessment: Report given to RN and Post -op Vital signs reviewed and stable  Post vital signs: Reviewed and stable  Last Vitals:  Vitals Value Taken Time  BP 105/62 10/31/21 0950  Temp    Pulse 78 10/31/21 0950  Resp 22 10/31/21 0950  SpO2 96 % 10/31/21 0950    Last Pain:  Vitals:   10/31/21 0950  TempSrc:   PainSc: Asleep         Complications: No notable events documented.

## 2021-11-01 ENCOUNTER — Encounter (HOSPITAL_COMMUNITY): Payer: Self-pay | Admitting: Cardiology

## 2021-11-01 DIAGNOSIS — Z992 Dependence on renal dialysis: Secondary | ICD-10-CM | POA: Diagnosis not present

## 2021-11-01 DIAGNOSIS — N2581 Secondary hyperparathyroidism of renal origin: Secondary | ICD-10-CM | POA: Diagnosis not present

## 2021-11-01 DIAGNOSIS — D631 Anemia in chronic kidney disease: Secondary | ICD-10-CM | POA: Diagnosis not present

## 2021-11-01 DIAGNOSIS — D509 Iron deficiency anemia, unspecified: Secondary | ICD-10-CM | POA: Diagnosis not present

## 2021-11-01 DIAGNOSIS — D689 Coagulation defect, unspecified: Secondary | ICD-10-CM | POA: Diagnosis not present

## 2021-11-01 DIAGNOSIS — N186 End stage renal disease: Secondary | ICD-10-CM | POA: Diagnosis not present

## 2021-11-01 DIAGNOSIS — N179 Acute kidney failure, unspecified: Secondary | ICD-10-CM | POA: Diagnosis not present

## 2021-11-01 NOTE — Anesthesia Postprocedure Evaluation (Signed)
Anesthesia Post Note  Patient: Ruben [name]  Procedure(s) Performed: TRANSESOPHAGEAL ECHOCARDIOGRAM (TEE)     Patient location during evaluation: Endoscopy Anesthesia Type: MAC Level of consciousness: awake and alert Pain management: pain level controlled Vital Signs Assessment: post-procedure vital signs reviewed and stable Respiratory status: spontaneous breathing, nonlabored ventilation, respiratory function stable and patient connected to nasal cannula oxygen Cardiovascular status: stable and blood pressure returned to baseline Postop Assessment: no apparent nausea or vomiting Anesthetic complications: no   No notable events documented.  Last Vitals:  Vitals:   10/31/21 1010 10/31/21 1019  BP: 94/60   Pulse: 70 66  Resp: (!) 22 16  Temp:    SpO2: 94% 100%    Last Pain:  Vitals:   10/31/21 1019  TempSrc:   PainSc: 0-No pain                 Mahasin Riviere S

## 2021-11-04 DIAGNOSIS — N2581 Secondary hyperparathyroidism of renal origin: Secondary | ICD-10-CM | POA: Diagnosis not present

## 2021-11-04 DIAGNOSIS — N186 End stage renal disease: Secondary | ICD-10-CM | POA: Diagnosis not present

## 2021-11-04 DIAGNOSIS — D509 Iron deficiency anemia, unspecified: Secondary | ICD-10-CM | POA: Diagnosis not present

## 2021-11-04 DIAGNOSIS — Z992 Dependence on renal dialysis: Secondary | ICD-10-CM | POA: Diagnosis not present

## 2021-11-04 DIAGNOSIS — D689 Coagulation defect, unspecified: Secondary | ICD-10-CM | POA: Diagnosis not present

## 2021-11-04 DIAGNOSIS — L299 Pruritus, unspecified: Secondary | ICD-10-CM | POA: Diagnosis not present

## 2021-11-06 DIAGNOSIS — Z992 Dependence on renal dialysis: Secondary | ICD-10-CM | POA: Diagnosis not present

## 2021-11-06 DIAGNOSIS — L299 Pruritus, unspecified: Secondary | ICD-10-CM | POA: Diagnosis not present

## 2021-11-06 DIAGNOSIS — D689 Coagulation defect, unspecified: Secondary | ICD-10-CM | POA: Diagnosis not present

## 2021-11-06 DIAGNOSIS — N186 End stage renal disease: Secondary | ICD-10-CM | POA: Diagnosis not present

## 2021-11-06 DIAGNOSIS — D509 Iron deficiency anemia, unspecified: Secondary | ICD-10-CM | POA: Diagnosis not present

## 2021-11-06 DIAGNOSIS — N2581 Secondary hyperparathyroidism of renal origin: Secondary | ICD-10-CM | POA: Diagnosis not present

## 2021-11-07 ENCOUNTER — Telehealth: Payer: Self-pay

## 2021-11-07 NOTE — Telephone Encounter (Signed)
[employer] MitraClip review of 10/31/2021 TEE: "For patient Ruben [name]: This is secondary MR and does not require a surgical consult  This valve looks suitable for a MitraClip. The fossa looks approachable for transseptal puncture in the Bicaval view. LA dimensions are large enough for device steering and straddle. There appears to be 2 jets and one is centrally located. The posterior leaflet measures >1cm  in the LVOT grasping view. Gradient measures 1 mmHg (74BPM); MVA measures 5.34 cm2. The imaging appears to be very challenging. I'd like to take a look at the images in the case prior to the start before making a clip recommendation. "

## 2021-11-08 DIAGNOSIS — D689 Coagulation defect, unspecified: Secondary | ICD-10-CM | POA: Diagnosis not present

## 2021-11-08 DIAGNOSIS — N2581 Secondary hyperparathyroidism of renal origin: Secondary | ICD-10-CM | POA: Diagnosis not present

## 2021-11-08 DIAGNOSIS — D509 Iron deficiency anemia, unspecified: Secondary | ICD-10-CM | POA: Diagnosis not present

## 2021-11-08 DIAGNOSIS — N186 End stage renal disease: Secondary | ICD-10-CM | POA: Diagnosis not present

## 2021-11-08 DIAGNOSIS — L299 Pruritus, unspecified: Secondary | ICD-10-CM | POA: Diagnosis not present

## 2021-11-08 DIAGNOSIS — Z992 Dependence on renal dialysis: Secondary | ICD-10-CM | POA: Diagnosis not present

## 2021-11-11 ENCOUNTER — Other Ambulatory Visit (HOSPITAL_COMMUNITY): Payer: Self-pay | Admitting: *Deleted

## 2021-11-11 ENCOUNTER — Other Ambulatory Visit (HOSPITAL_COMMUNITY): Payer: Self-pay

## 2021-11-11 DIAGNOSIS — D509 Iron deficiency anemia, unspecified: Secondary | ICD-10-CM | POA: Diagnosis not present

## 2021-11-11 DIAGNOSIS — L299 Pruritus, unspecified: Secondary | ICD-10-CM | POA: Diagnosis not present

## 2021-11-11 DIAGNOSIS — N186 End stage renal disease: Secondary | ICD-10-CM | POA: Diagnosis not present

## 2021-11-11 DIAGNOSIS — Z992 Dependence on renal dialysis: Secondary | ICD-10-CM | POA: Diagnosis not present

## 2021-11-11 DIAGNOSIS — D689 Coagulation defect, unspecified: Secondary | ICD-10-CM | POA: Diagnosis not present

## 2021-11-11 DIAGNOSIS — N2581 Secondary hyperparathyroidism of renal origin: Secondary | ICD-10-CM | POA: Diagnosis not present

## 2021-11-11 MED ORDER — AMIODARONE HCL 200 MG PO TABS: 200.0000 mg | ORAL_TABLET | Freq: Every day | ORAL | 2 refills | Status: DC

## 2021-11-11 MED ORDER — AMIODARONE HCL 200 MG PO TABS: 200.0000 mg | ORAL_TABLET | Freq: Every day | ORAL | 3 refills | Status: DC

## 2021-11-12 DIAGNOSIS — I871 Compression of vein: Secondary | ICD-10-CM | POA: Diagnosis not present

## 2021-11-12 DIAGNOSIS — Z992 Dependence on renal dialysis: Secondary | ICD-10-CM | POA: Diagnosis not present

## 2021-11-12 DIAGNOSIS — T82858A Stenosis of vascular prosthetic devices, implants and grafts, initial encounter: Secondary | ICD-10-CM | POA: Diagnosis not present

## 2021-11-12 DIAGNOSIS — N186 End stage renal disease: Secondary | ICD-10-CM | POA: Diagnosis not present

## 2021-11-13 DIAGNOSIS — N2581 Secondary hyperparathyroidism of renal origin: Secondary | ICD-10-CM | POA: Diagnosis not present

## 2021-11-13 DIAGNOSIS — L299 Pruritus, unspecified: Secondary | ICD-10-CM | POA: Diagnosis not present

## 2021-11-13 DIAGNOSIS — D509 Iron deficiency anemia, unspecified: Secondary | ICD-10-CM | POA: Diagnosis not present

## 2021-11-13 DIAGNOSIS — D689 Coagulation defect, unspecified: Secondary | ICD-10-CM | POA: Diagnosis not present

## 2021-11-13 DIAGNOSIS — N186 End stage renal disease: Secondary | ICD-10-CM | POA: Diagnosis not present

## 2021-11-13 DIAGNOSIS — Z992 Dependence on renal dialysis: Secondary | ICD-10-CM | POA: Diagnosis not present

## 2021-11-14 DIAGNOSIS — R638 Other symptoms and signs concerning food and fluid intake: Secondary | ICD-10-CM | POA: Diagnosis not present

## 2021-11-14 DIAGNOSIS — G2581 Restless legs syndrome: Secondary | ICD-10-CM | POA: Diagnosis not present

## 2021-11-14 DIAGNOSIS — R634 Abnormal weight loss: Secondary | ICD-10-CM | POA: Diagnosis not present

## 2021-11-15 DIAGNOSIS — N186 End stage renal disease: Secondary | ICD-10-CM | POA: Diagnosis not present

## 2021-11-15 DIAGNOSIS — Z992 Dependence on renal dialysis: Secondary | ICD-10-CM | POA: Diagnosis not present

## 2021-11-15 DIAGNOSIS — N2581 Secondary hyperparathyroidism of renal origin: Secondary | ICD-10-CM | POA: Diagnosis not present

## 2021-11-15 DIAGNOSIS — L299 Pruritus, unspecified: Secondary | ICD-10-CM | POA: Diagnosis not present

## 2021-11-15 DIAGNOSIS — D509 Iron deficiency anemia, unspecified: Secondary | ICD-10-CM | POA: Diagnosis not present

## 2021-11-15 DIAGNOSIS — D689 Coagulation defect, unspecified: Secondary | ICD-10-CM | POA: Diagnosis not present

## 2021-11-17 NOTE — Telephone Encounter (Signed)
I agree. I think it depends how much Kirk Ruths thinks the patient might benefit from Clip. I don't have any experience knowing how much easier that makes the TEE imaging.

## 2021-11-18 DIAGNOSIS — N2581 Secondary hyperparathyroidism of renal origin: Secondary | ICD-10-CM | POA: Diagnosis not present

## 2021-11-18 DIAGNOSIS — D689 Coagulation defect, unspecified: Secondary | ICD-10-CM | POA: Diagnosis not present

## 2021-11-18 DIAGNOSIS — L299 Pruritus, unspecified: Secondary | ICD-10-CM | POA: Diagnosis not present

## 2021-11-18 DIAGNOSIS — N186 End stage renal disease: Secondary | ICD-10-CM | POA: Diagnosis not present

## 2021-11-18 DIAGNOSIS — D509 Iron deficiency anemia, unspecified: Secondary | ICD-10-CM | POA: Diagnosis not present

## 2021-11-18 DIAGNOSIS — Z992 Dependence on renal dialysis: Secondary | ICD-10-CM | POA: Diagnosis not present

## 2021-11-19 NOTE — Telephone Encounter (Signed)
Discussed with Dr. Burt Knack. Scheduled the patient for consult with Dr. Burt Knack 11/26/2021 to discuss TEE with anesthesia/paralysis/MitraClip. His daughter was grateful for call and agrees with plan.

## 2021-11-20 DIAGNOSIS — D509 Iron deficiency anemia, unspecified: Secondary | ICD-10-CM | POA: Diagnosis not present

## 2021-11-20 DIAGNOSIS — D689 Coagulation defect, unspecified: Secondary | ICD-10-CM | POA: Diagnosis not present

## 2021-11-20 DIAGNOSIS — N2581 Secondary hyperparathyroidism of renal origin: Secondary | ICD-10-CM | POA: Diagnosis not present

## 2021-11-20 DIAGNOSIS — Z992 Dependence on renal dialysis: Secondary | ICD-10-CM | POA: Diagnosis not present

## 2021-11-20 DIAGNOSIS — N186 End stage renal disease: Secondary | ICD-10-CM | POA: Diagnosis not present

## 2021-11-20 DIAGNOSIS — L299 Pruritus, unspecified: Secondary | ICD-10-CM | POA: Diagnosis not present

## 2021-11-20 NOTE — Progress Notes (Signed)
ADVANCED HF CLINIC CONSULT NOTE  Primary Care: Ruben Jewel, MD HF Cardiologist: Dr. Aundra Dubin  HPI: Ruben [name] 76 y.o. male with history of HTN, HLD, ESRD, tobacco and alcohol abuse (pint a [name] of wine formerly), depression.   Admitted 04/29/21 with suspected late presentation MI. Went into AF with RVR and placed on BiPAP. He was initiated on IV amio, heparin gtt, nitro gtt and IV diuretics. Echo with LVEF 35-40%, inferior hypokinesis, RV okay, splay artifact with likely moderate to severe MR. Subsequently developed cardiogenic shock. Initial Co-ox low at 47% and started on NE + milrinone. Advanced HF team consulted. TEE showed EF 35-40%, RV okay, moderate AI, moderate MR (suspect infarct related). R/LHC (initially delayed d/t AKI on CKD): Occluded RCA (culprit), diffuse disease in LAD and Lcx. On 0.25 mil RA mean 16, PA 66/26 (41), PCWP 27, PA 62%, Fick CO 5.04/ CI 2.56.    Seen by CVTS. Not felt to be a surgical candidate. AUnderwent TEE/DCCV --> SR. Repeat echo EF 35-40%, RV okay, moderate AI, trivial TR, moderate, likely infarct-related, MR. IV amio weaned off and transitioned to amio 200 mg daily. After procedures completed he was started on Eliquis. Course c/b AKI on CKD, felt to be d/t worsening ATN in setting of shock/episodes of hypotension versus CIN. Placed on CVVHD and later switched to iHD. Discharged home, weight 165 lbs.  Admitted 2/23 with rectal pain and dysphagia. GI consulted and patient manually disimpacted. Had MRI liver due to elevated LFTs which showed 12 mm benign lesions.   Echo 06/23: EF 40-45%, moderate to severe ischemic MR  TEE 06/23: EF 25-30%, RV mildly reduced, severe ischemic MR  Has been referred to structural heart team for consideration of MitraClip.  He is here today for 2 month follow-up.  Patient is accompanied by his daughter. Denies dyspnea, orthopnea, PND or lower extremity edema. States he isn't very active.  Has been struggling with depression  and anxiety and was recently started on medications by PCP.  Occasionally has hypotension during HD sessions and takes extra midodrine PRN. BP okay on non HD days. Does not drink much fluids at all, daughter encourages him.   PMH: 1. ESRD 2. H/o ETOH abuse: Quit 12/22.  3. Atrial fibrillation: Paroxysmal, s/p DCCV in 1/23.  4. Mitral regurgitation: Suspect infarct-related.  - TEE (1/23): EF 35-40%, basal to mid inferior and inferolateral AK, RV normal, moderate AI, suspect moderate infarct-related MR.  5. Depression 6. CAD: 12/22 late presentation inferior MI.   - LHC (1/23): Occluded RCA with collaterals, serial 50%/80%/80% mid-distal LCx stenoses, serial 60%/70%/50% proximal to mid LAD stenoses, 70% ostial D1. No good PCI targets and not a CABG candidate.  7. Chronic systolic CHF: TEE (7/37) with EF 35-40%, basal to mid inferior and inferolateral AK, RV normal, moderate AI, suspect moderate infarct-related MR. - Echo 06/23: EF 40-45%, moderate to severe ischemic MR - TEE 06/23: EF 25-30%, RV mildly reduced, severe ischemic MR  Review of Systems: All systems reviewed and negative except as per HPI.    Current Outpatient Medications  Medication Sig Dispense Refill   acetaminophen (TYLENOL) 650 MG CR tablet Take 650 mg by mouth every 8 (eight) hours as needed for pain.     albuterol (PROVENTIL) (2.5 MG/3ML) 0.083% nebulizer solution Take 3 mLs (2.5 mg total) by nebulization every 4 (four) hours as needed for wheezing or shortness of breath. 75 mL 2   ALPRAZolam (XANAX) 0.25 MG tablet Take 1 tablet (0.25 mg total) by  mouth daily as needed for anxiety (anxiety with dialysis). 30 tablet 1   amiodarone (PACERONE) 200 MG tablet Take 1 tablet (200 mg total) by mouth daily. 90 tablet 2   apixaban (ELIQUIS) 5 MG TABS tablet Take 1 tablet (5 mg total) by mouth 2 (two) times daily. 60 tablet 6   cetirizine (ZYRTEC) 10 MG tablet Take 10 mg by mouth daily as needed for allergies.     cholecalciferol  (VITAMIN D3) 25 MCG (1000 UNIT) tablet Take 1,000 Units by mouth daily.     gabapentin (NEURONTIN) 100 MG capsule 1 capsule     hydrOXYzine (ATARAX) 10 MG tablet Take 10 mg by mouth at bedtime.     lidocaine-prilocaine (EMLA) cream Apply 1 Application topically as needed.     magnesium oxide (MAG-OX) 400 (240 Mg) MG tablet Take 400 mg by mouth daily.     metoCLOPramide (REGLAN) 5 MG tablet Take 1 tablet (5 mg total) by mouth every 8 (eight) hours as needed for nausea. 6 tablet 0   mirtazapine (REMERON) 30 MG tablet Take 30 mg by mouth at bedtime.     Multiple Vitamins-Minerals (MULTIVITAMIN WITH MINERALS) tablet Take 1 tablet by mouth daily.     nitroGLYCERIN (NITROSTAT) 0.4 MG SL tablet Place 1 tablet (0.4 mg total) under the tongue every 5 (five) minutes as needed for chest pain. 100 tablet 3   ondansetron (ZOFRAN) 4 MG tablet Take 1 tablet (4 mg total) by mouth daily as needed for nausea or vomiting. 30 tablet 1   phenylephrine (,USE FOR PREPARATION-H,) 0.25 % suppository Place 1 suppository rectally as needed for hemorrhoids.     Propylene Glycol (SYSTANE COMPLETE) 0.6 % SOLN Place 1 drop into both eyes as needed (dry eyes).     atorvastatin (LIPITOR) 80 MG tablet Take 1 tablet (80 mg total) by mouth daily. 90 tablet 3   diazepam (VALIUM) 5 MG tablet SMARTSIG:1 Tablet(s) By Mouth     midodrine (PROAMATINE) 10 MG tablet Take 1.5 tablets (15 mg total) by mouth 3 (three) times daily with meals. 150 tablet 6   mirtazapine (REMERON) 15 MG tablet Take 15 mg by mouth at bedtime.     omeprazole (PRILOSEC) 40 MG capsule 1 capsule 30 minutes before morning meal     ondansetron (ZOFRAN-ODT) 4 MG disintegrating tablet Take 4 mg by mouth daily as needed.     polyethylene glycol powder (MIRALAX) 17 GM/SCOOP powder See admin instructions.     predniSONE (DELTASONE) 20 MG tablet      prochlorperazine (COMPAZINE) 5 MG tablet Take 5 mg by mouth 3 (three) times daily as needed.     sertraline (ZOLOFT) 25 MG  tablet Take 25 mg by mouth daily.     No current facility-administered medications for this encounter.   Allergies  Allergen Reactions   Other Other (See Comments)    Pollen - sneezing, itching/watery eyes   Social History   Socioeconomic History   Marital status: Single    Spouse name: Not on file   Number of children: Not on file   Years of education: Not on file   Highest education level: Not on file  Occupational History   Not on file  Tobacco Use   Smoking status: Former    Packs/[name]: 1.50    Years: 20.00    Total pack years: 30.00    Types: Cigarettes   Smokeless tobacco: Never   Tobacco comments:    down to 2 packs per week; 3/21 -  down to 1 Pack per week  Vaping Use   Vaping Use: Never used  Substance and Sexual Activity   Alcohol use: No    Alcohol/week: 0.0 standard drinks of alcohol   Drug use: No   Sexual activity: Not on file  Other Topics Concern   Not on file  Social History Narrative   Not on file   Social Determinants of Health   Financial Resource Strain: Medium Risk (05/02/2021)   Overall Financial Resource Strain (CARDIA)    Difficulty of Paying Living Expenses: Somewhat hard  Food Insecurity: No Food Insecurity (05/02/2021)   Hunger Vital Sign    Worried About Running Out of Food in the Last Year: Never true    Ran Out of Food in the Last Year: Never true  Transportation Needs: No Transportation Needs (05/02/2021)   PRAPARE - Hydrologist (Medical): No    Lack of Transportation (Non-Medical): No  Physical Activity: Not on file  Stress: Not on file  Social Connections: Not on file  Intimate Partner Violence: Not on file   Family History  Problem Relation Age of Onset   Heart attack Brother    Diabetes Brother    Heart disease Brother    Cancer Father    Diabetes Brother    Heart disease Brother    BP (!) 100/58   Pulse 76   Wt 70.2 kg (154 lb 12.8 oz)   SpO2 96%   BMI 22.21 kg/m   Wt Readings  from Last 3 Encounters:  11/21/21 70.2 kg (154 lb 12.8 oz)  10/31/21 70.3 kg (155 lb)  10/11/21 72.5 kg (159 lb 12.8 oz)   PHYSICAL EXAM: General:  Thin elderly male. Ambulated into clinic. HEENT: normal Neck: supple. JVP 8 cm. Carotids 2+ bilat; no bruits.  Cor: PMI nondisplaced. Regular rate & rhythm. No rubs, gallops, 2/6 MR murmur Lungs: clear Abdomen: soft, nontender, mildly distended. No hepatosplenomegaly.  Extremities: no cyanosis, clubbing, rash, edema Neuro: alert & orientedx3, cranial nerves grossly intact. moves all 4 extremities w/o difficulty. Affect pleasant   ASSESSMENT & PLAN: 1. CAD: Suspect delayed presentation inferior MI in 12/22.  He was not cathed initially due to AKI and delayed presentation. Eventual LHC with occluded RCA (culprit), diffuse moderate disease in LAD and LCx. No good PCI targets. Not a candidate for CABG. No chest pain now.   - Leg weakness did not improve with decreasing Atorvastatin. Increase back to 80 mg daily. Check lipid panel and LFTs at next visit - He is on Eliquis so no ASA.   - Previously referred for cardiac rehab but has not started program. Encouraged activity, would likely help with anxiety and depression. 2. Chronic systolic CHF: Ischemic cardiomyopathy with infarct-related MR. Echo in 12/22 with EF 35-40%, inferolateral and basal inferior akinesis, moderate-severe eccentric likely infarct-related MR, normal RV.  TEE in 1/23 with EF 35-40%, moderate MR (suspect infarct-related), aortic dilatation measuring 44 mm, moderate AI. Cardiogenic shock requiring milrinone during 12/22-1/23 hospitalization, titrated off.  Now requiring midodrine to maintain BP with HD. Echo 06/23: EF 40-45%, RV mildly reduced, moderate to severe ischemic MR. TEE, 06/23: EF 25-30%, RV mildly reduced, severe MR - Continue midodrine 15 mg TID - HD for volume management.  Looks mildly up today - Unable to initiate GDMT due to low BP.  3. Valvular heart disease:  MR  moderate in severity on TEE in 1/23. Likely infarct related. Moderate AI.  -Severe ischemic MR on TEE  06/23 -Seeing Dr. Burt Knack on 11/26/21 for consideration of MitraClip 4. Atrial fibrillation: First noted in 12/22, s/p TEE-guided DCCV in 1/23. Rhythm regular today - Continue amiodarone 200 mg daily. Check LFTs and TSH today, will need regular eye exam.  - Continue Eliquis 5 mg bid. 5. ESRD: Patient has trouble with hypotension with HD.   - Now on midodrine 15 mg TID 6. Smoker: Quit 04/28/21, congratulated on quitting. 7. ETOH abuse: Last drink was 04/28/21, congratulated on quitting.  9. Depression/anxiety: Management per PCP - Recently started Sertraline  Followup 3 months with Dr. Aundra Dubin  [facility], [place] Comment N 11/21/2021

## 2021-11-21 ENCOUNTER — Encounter (HOSPITAL_COMMUNITY): Payer: Self-pay

## 2021-11-21 ENCOUNTER — Ambulatory Visit (HOSPITAL_COMMUNITY)
Admission: RE | Admit: 2021-11-21 | Discharge: 2021-11-21 | Disposition: A | Discharge status: Home / Self Care | Payer: Medicare Other | Source: Ambulatory Visit | Attending: Physician Assistant | Admitting: Physician Assistant

## 2021-11-21 VITALS — BP 100/58 | HR 76 | Wt 154.8 lb

## 2021-11-21 DIAGNOSIS — I340 Nonrheumatic mitral (valve) insufficiency: Secondary | ICD-10-CM | POA: Diagnosis not present

## 2021-11-21 DIAGNOSIS — N179 Acute kidney failure, unspecified: Secondary | ICD-10-CM | POA: Diagnosis not present

## 2021-11-21 DIAGNOSIS — Z87891 Personal history of nicotine dependence: Secondary | ICD-10-CM | POA: Insufficient documentation

## 2021-11-21 DIAGNOSIS — N186 End stage renal disease: Secondary | ICD-10-CM | POA: Insufficient documentation

## 2021-11-21 DIAGNOSIS — I255 Ischemic cardiomyopathy: Secondary | ICD-10-CM | POA: Diagnosis not present

## 2021-11-21 DIAGNOSIS — I132 Hypertensive heart and chronic kidney disease with heart failure and with stage 5 chronic kidney disease, or end stage renal disease: Secondary | ICD-10-CM | POA: Insufficient documentation

## 2021-11-21 DIAGNOSIS — I2510 Atherosclerotic heart disease of native coronary artery without angina pectoris: Secondary | ICD-10-CM | POA: Diagnosis not present

## 2021-11-21 DIAGNOSIS — F1010 Alcohol abuse, uncomplicated: Secondary | ICD-10-CM | POA: Insufficient documentation

## 2021-11-21 DIAGNOSIS — I480 Paroxysmal atrial fibrillation: Secondary | ICD-10-CM | POA: Diagnosis not present

## 2021-11-21 DIAGNOSIS — Z7901 Long term (current) use of anticoagulants: Secondary | ICD-10-CM | POA: Insufficient documentation

## 2021-11-21 DIAGNOSIS — F32A Depression, unspecified: Secondary | ICD-10-CM | POA: Diagnosis not present

## 2021-11-21 DIAGNOSIS — I252 Old myocardial infarction: Secondary | ICD-10-CM | POA: Insufficient documentation

## 2021-11-21 DIAGNOSIS — I5022 Chronic systolic (congestive) heart failure: Secondary | ICD-10-CM

## 2021-11-21 DIAGNOSIS — I953 Hypotension of hemodialysis: Secondary | ICD-10-CM | POA: Diagnosis not present

## 2021-11-21 DIAGNOSIS — Z79899 Other long term (current) drug therapy: Secondary | ICD-10-CM | POA: Diagnosis not present

## 2021-11-21 DIAGNOSIS — E785 Hyperlipidemia, unspecified: Secondary | ICD-10-CM | POA: Insufficient documentation

## 2021-11-21 DIAGNOSIS — F419 Anxiety disorder, unspecified: Secondary | ICD-10-CM | POA: Insufficient documentation

## 2021-11-21 LAB — COMPREHENSIVE METABOLIC PANEL
ALT: 25 U/L (ref 0–44)
AST: 24 U/L (ref 15–41)
Albumin: 3.7 g/dL (ref 3.5–5.0)
Alkaline Phosphatase: 74 U/L (ref 38–126)
Anion gap: 11 (ref 5–15)
BUN: 35 mg/dL — ABNORMAL HIGH (ref 8–23)
CO2: 32 mmol/L (ref 22–32)
Calcium: 9.7 mg/dL (ref 8.9–10.3)
Chloride: 97 mmol/L — ABNORMAL LOW (ref 98–111)
Creatinine, Ser: 5.94 mg/dL — ABNORMAL HIGH (ref 0.61–1.24)
GFR, Estimated: 9 mL/min — ABNORMAL LOW (ref >60)
Glucose, Bld: 90 mg/dL (ref 70–99)
Potassium: 4.7 mmol/L (ref 3.5–5.1)
Sodium: 140 mmol/L (ref 135–145)
Total Bilirubin: 1.5 mg/dL — ABNORMAL HIGH (ref 0.3–1.2)
Total Protein: 7.1 g/dL (ref 6.5–8.1)

## 2021-11-21 LAB — TSH: TSH: 2.751 u[IU]/mL (ref 0.350–4.500)

## 2021-11-21 MED ORDER — ATORVASTATIN CALCIUM 80 MG PO TABS: 80.0000 mg | ORAL_TABLET | Freq: Every day | ORAL | 3 refills | Status: DC

## 2021-11-21 MED ORDER — MIDODRINE HCL 10 MG PO TABS: 15.0000 mg | ORAL_TABLET | Freq: Three times a day (TID) | ORAL | 6 refills | Status: DC

## 2021-11-21 NOTE — Patient Instructions (Addendum)
It was great to see you today! No medication changes are needed at this time. -for medications not related to heart failure please contact your pcp for additional refills  (Xanax,boost,and gabapentin)   Labs today We will only contact you if something comes back abnormal or we need to make some changes. Otherwise no news is good news!  Your physician recommends that you schedule a follow-up appointment in: 3 months with Dr Aundra Dubin  Do the following things EVERYDAY: Weigh yourself in the morning before breakfast. Write it down and keep it in a log. Take your medicines as prescribed Eat low salt foods--Limit salt (sodium) to 2000 mg per day.  Stay as active as you can everyday Limit all fluids for the day to less than 2 liters  At the [facility], you and your health needs are our priority. As part of our continuing mission to provide you with exceptional heart care, we have created designated Provider Care Teams. These Care Teams include your primary Cardiologist (physician) and Advanced Practice Providers (APPs- Physician Assistants and Nurse Practitioners) who all work together to provide you with the care you need, when you need it.   You may see any of the following providers on your designated Care Team at your next follow up: Dr Glori Bickers Dr Haynes Kerns, NP Lyda Jester, [place] [facility] [place], [place] Audry Riles, PharmD   Please be sure to bring in all your medications bottles to every appointment.

## 2021-11-22 DIAGNOSIS — L299 Pruritus, unspecified: Secondary | ICD-10-CM | POA: Diagnosis not present

## 2021-11-22 DIAGNOSIS — E43 Unspecified severe protein-calorie malnutrition: Secondary | ICD-10-CM | POA: Diagnosis not present

## 2021-11-22 DIAGNOSIS — D509 Iron deficiency anemia, unspecified: Secondary | ICD-10-CM | POA: Diagnosis not present

## 2021-11-22 DIAGNOSIS — I5021 Acute systolic (congestive) heart failure: Secondary | ICD-10-CM | POA: Diagnosis not present

## 2021-11-22 DIAGNOSIS — D689 Coagulation defect, unspecified: Secondary | ICD-10-CM | POA: Diagnosis not present

## 2021-11-22 DIAGNOSIS — N186 End stage renal disease: Secondary | ICD-10-CM | POA: Diagnosis not present

## 2021-11-22 DIAGNOSIS — Z992 Dependence on renal dialysis: Secondary | ICD-10-CM | POA: Diagnosis not present

## 2021-11-22 DIAGNOSIS — N2581 Secondary hyperparathyroidism of renal origin: Secondary | ICD-10-CM | POA: Diagnosis not present

## 2021-11-25 DIAGNOSIS — E43 Unspecified severe protein-calorie malnutrition: Secondary | ICD-10-CM | POA: Diagnosis not present

## 2021-11-25 DIAGNOSIS — N186 End stage renal disease: Secondary | ICD-10-CM | POA: Diagnosis not present

## 2021-11-25 DIAGNOSIS — D509 Iron deficiency anemia, unspecified: Secondary | ICD-10-CM | POA: Diagnosis not present

## 2021-11-25 DIAGNOSIS — D689 Coagulation defect, unspecified: Secondary | ICD-10-CM | POA: Diagnosis not present

## 2021-11-25 DIAGNOSIS — L299 Pruritus, unspecified: Secondary | ICD-10-CM | POA: Diagnosis not present

## 2021-11-25 DIAGNOSIS — Z992 Dependence on renal dialysis: Secondary | ICD-10-CM | POA: Diagnosis not present

## 2021-11-25 DIAGNOSIS — N2581 Secondary hyperparathyroidism of renal origin: Secondary | ICD-10-CM | POA: Diagnosis not present

## 2021-11-25 DIAGNOSIS — I5021 Acute systolic (congestive) heart failure: Secondary | ICD-10-CM | POA: Diagnosis not present

## 2021-11-26 ENCOUNTER — Ambulatory Visit (INDEPENDENT_AMBULATORY_CARE_PROVIDER_SITE_OTHER): Payer: Medicare Other | Admitting: Cardiovascular Disease

## 2021-11-26 ENCOUNTER — Encounter: Payer: Self-pay | Admitting: Cardiovascular Disease

## 2021-11-26 VITALS — BP 102/58 | HR 79 | Ht 70.0 in | Wt 157.4 lb

## 2021-11-26 DIAGNOSIS — I340 Nonrheumatic mitral (valve) insufficiency: Secondary | ICD-10-CM

## 2021-11-26 NOTE — Progress Notes (Signed)
Cardiology Office Note:    Date:  11/26/2021   ID:  Ruben [name], DOB 01-04-1946, MRN [id number]  PCP:  [name], MD   [place] [facility] Cardiologist:  Sanda Klein, MD     Referring MD: [name], MD   Chief Complaint  Patient presents with   Mitral Regurgitation    History of Present Illness:    Ruben [name] is a 76 y.o. male presenting for evaluation of mitral regurgitation, referred by Dr. Aundra Dubin.  The patient has a history of hypertension, mixed hyperlipidemia, end-stage renal disease, tobacco abuse, and former alcohol abuse.  He was hospitalized in December 2022 with a late presenting inferior infarction and was noted to have moderately severe LV dysfunction at that time with an LVEF of 35 to 40%.  Echo showed moderate to severe mitral regurgitation.  Patient went on to develop cardiogenic shock and required norepinephrine and milrinone for support.  MI was also complicated by atrial fibrillation and the patient underwent TEE cardioversion.  He required amiodarone and was anticoagulated with Eliquis.  His clinical course was complicated by acute kidney injury on a background of chronic kidney disease and he ultimately required CVVHD followed by intermittent hemodialysis.  The patient is here with his daughter today.  He continues to have some struggles with dialysis, but reports that he is tolerating it better than he did initially.  He still has a temporary catheter and but is dialyzing through a left arm graft.  His blood pressure has improved with the use of midodrine.  His primary complaints are fatigue and instability on his feet.  He ambulates with a cane.  He is short of breath with activity, but does not have much trouble with this.  He denies orthopnea or PND.  He has pretty severe insomnia and states that it takes him several hours to fall asleep.  He used to be a heavy alcohol drinker and has quit completely since his MI in  December 2022.  He no longer smokes cigarettes.  Past Medical History:  Diagnosis Date   Arthritis    Chronic kidney disease, stage 3b ([place])    ESRD (end stage renal disease) ([place])    ETOH abuse    History of stress test 09/02/2008   showed inferolateral scar without ischemia   Hx of echocardiogram 09/02/2008   was essentially normal   Hyperlipidemia    Hypertension    Myocardial infarction [facility])    Tobacco abuse     Past Surgical History:  Procedure Laterality Date   AV FISTULA PLACEMENT Left 05/27/2021   Procedure: LEFT ARTERIOVENOUS (AV) GRAFT CREATION;  Surgeon: Broadus John, MD;  Location: [place];  Service: Vascular;  Laterality: Left;  PERIPHERAL NERVE BLOCK   BACK SURGERY     Dr Luiz Ochoa involving L3-L4 discectomy.   BIOPSY  06/14/2021   Procedure: BIOPSY;  Surgeon: Carol Ada, MD;  Location: [facility] ENDOSCOPY;  Service: Endoscopy;;   CARDIOVERSION N/A 05/13/2021   Procedure: CARDIOVERSION;  Surgeon: Larey Dresser, MD;  Location: [facility] ENDOSCOPY;  Service: Cardiovascular;  Laterality: N/A;   FLEXIBLE SIGMOIDOSCOPY N/A 06/14/2021   Procedure: Beryle Quant;  Surgeon: Carol Ada, MD;  Location: [place];  Service: Endoscopy;  Laterality: N/A;   HERNIA REPAIR     IR FLUORO GUIDE CV LINE RIGHT  05/21/2021   IR US GUIDE VASC ACCESS RIGHT  05/21/2021   RIGHT/LEFT HEART CATH AND CORONARY ANGIOGRAPHY N/A 05/09/2021   Procedure: RIGHT/LEFT HEART CATH  AND CORONARY ANGIOGRAPHY;  Surgeon: Larey Dresser, MD;  Location: [place] CV LAB;  Service: Cardiovascular;  Laterality: N/A;   SPINE SURGERY     TEE WITHOUT CARDIOVERSION N/A 05/07/2021   Procedure: TRANSESOPHAGEAL ECHOCARDIOGRAM (TEE);  Surgeon: Larey Dresser, MD;  Location: [facility] ENDOSCOPY;  Service: Cardiovascular;  Laterality: N/A;   TEE WITHOUT CARDIOVERSION N/A 05/13/2021   Procedure: TRANSESOPHAGEAL ECHOCARDIOGRAM (TEE);  Surgeon: Larey Dresser, MD;  Location: [facility] ENDOSCOPY;  Service: Cardiovascular;  Laterality:  N/A;   TEE WITHOUT CARDIOVERSION N/A 10/31/2021   Procedure: TRANSESOPHAGEAL ECHOCARDIOGRAM (TEE);  Surgeon: Larey Dresser, MD;  Location: [facility] ENDOSCOPY;  Service: Cardiovascular;  Laterality: N/A;    Current Medications: Current Meds  Medication Sig   acetaminophen (TYLENOL) 650 MG CR tablet Take 650 mg by mouth every 8 (eight) hours as needed for pain.   albuterol (PROVENTIL) (2.5 MG/3ML) 0.083% nebulizer solution Take 3 mLs (2.5 mg total) by nebulization every 4 (four) hours as needed for wheezing or shortness of breath.   ALPRAZolam (XANAX) 0.25 MG tablet Take 1 tablet (0.25 mg total) by mouth daily as needed for anxiety (anxiety with dialysis).   amiodarone (PACERONE) 200 MG tablet Take 1 tablet (200 mg total) by mouth daily.   apixaban (ELIQUIS) 5 MG TABS tablet Take 1 tablet (5 mg total) by mouth 2 (two) times daily.   atorvastatin (LIPITOR) 80 MG tablet Take 1 tablet (80 mg total) by mouth daily.   cetirizine (ZYRTEC) 10 MG tablet Take 10 mg by mouth daily as needed for allergies.   cholecalciferol (VITAMIN D3) 25 MCG (1000 UNIT) tablet Take 1,000 Units by mouth daily.   diazepam (VALIUM) 5 MG tablet Take 5 mg by mouth as needed.   gabapentin (NEURONTIN) 100 MG capsule 1 capsule   hydrOXYzine (ATARAX) 10 MG tablet Take 10 mg by mouth at bedtime.   lidocaine-prilocaine (EMLA) cream Apply 1 Application topically as needed.   magnesium oxide (MAG-OX) 400 (240 Mg) MG tablet Take 400 mg by mouth daily.   metoCLOPramide (REGLAN) 5 MG tablet Take 1 tablet (5 mg total) by mouth every 8 (eight) hours as needed for nausea.   midodrine (PROAMATINE) 10 MG tablet Take 1.5 tablets (15 mg total) by mouth 3 (three) times daily with meals.   mirtazapine (REMERON) 15 MG tablet Take 15 mg by mouth at bedtime.   mirtazapine (REMERON) 30 MG tablet Take 30 mg by mouth at bedtime.   Multiple Vitamins-Minerals (MULTIVITAMIN WITH MINERALS) tablet Take 1 tablet by mouth daily.   nitroGLYCERIN (NITROSTAT)  0.4 MG SL tablet Place 1 tablet (0.4 mg total) under the tongue every 5 (five) minutes as needed for chest pain.   omeprazole (PRILOSEC) 40 MG capsule 1 capsule 30 minutes before morning meal   ondansetron (ZOFRAN) 4 MG tablet Take 1 tablet (4 mg total) by mouth daily as needed for nausea or vomiting.   phenylephrine (,USE FOR PREPARATION-H,) 0.25 % suppository Place 1 suppository rectally as needed for hemorrhoids.   polyethylene glycol powder (MIRALAX) 17 GM/SCOOP powder See admin instructions.   prochlorperazine (COMPAZINE) 5 MG tablet Take 5 mg by mouth 3 (three) times daily as needed.   Propylene Glycol (SYSTANE COMPLETE) 0.6 % SOLN Place 1 drop into both eyes as needed (dry eyes).   sertraline (ZOLOFT) 25 MG tablet Take 25 mg by mouth daily.     Allergies:   Other   Social History   Socioeconomic History   Marital status: Single    Spouse  name: Not on file   Number of children: Not on file   Years of education: Not on file   Highest education level: Not on file  Occupational History   Not on file  Tobacco Use   Smoking status: Former    Packs/day: 1.50    Years: 20.00    Total pack years: 30.00    Types: Cigarettes   Smokeless tobacco: Never   Tobacco comments:    down to 2 packs per week; 3/21 - down to 1 Pack per week  Vaping Use   Vaping Use: Never used  Substance and Sexual Activity   Alcohol use: No    Alcohol/week: 0.0 standard drinks of alcohol   Drug use: No   Sexual activity: Not on file  Other Topics Concern   Not on file  Social History Narrative   Not on file   Social Determinants of Health   Financial Resource Strain: Medium Risk (05/02/2021)   Overall Financial Resource Strain (CARDIA)    Difficulty of Paying Living Expenses: Somewhat hard  Food Insecurity: No Food Insecurity (05/02/2021)   Hunger Vital Sign    Worried About Running Out of Food in the Last Year: Never true    Ran Out of Food in the Last Year: Never true  Transportation Needs: No  Transportation Needs (05/02/2021)   PRAPARE - Hydrologist (Medical): No    Lack of Transportation (Non-Medical): No  Physical Activity: Not on file  Stress: Not on file  Social Connections: Not on file     Family History: The patient's family history includes Cancer in his father; Diabetes in his brother and brother; Heart attack in his brother; Heart disease in his brother and brother.  ROS:   Please see the history of present illness.    All other systems reviewed and are negative.  EKGs/Labs/Other Studies Reviewed:    The following studies were reviewed today: TEE 2021-11-14: 1. Left ventricular ejection fraction, by estimation, is 25 to 30%. The  left ventricle has severely decreased function. The left ventricle  demonstrates regional wall motion abnormalities with inferoseptal,  inferior, and inferolateral akinesis. The left  ventricular internal cavity size was mildly dilated.   2. Peak RV-RA gradient 32 mmHg. Right ventricular systolic function is  mildly reduced. The right ventricular size is normal.   3. Left atrial size was mildly dilated. No left atrial/left atrial  appendage thrombus was detected.   4. Right atrial size was mildly dilated.   5. Mitral valve area 5.34 cm^2 by planimetry, mean gradient 1 mmHg. 2  mitral regurgitation jets. Probably severe mitral regurgitation, vena  contracta area in 3D 0.291 + 0.125 = 0.41 cm^2 (2 jets). Pulmonary veins  interrogated, there was flattening but  not definite flow reversal in the systolic doppler pattern. Suspect  functional (infarct-related) MR. No evidence of mitral stenosis.   6. TR ERO 0.24 cm^2 by PISA. The tricuspid valve is abnormal. Tricuspid  valve regurgitation is moderate to severe.   7. Aortic insufficiency vena contracta 0.4 cm. The aortic valve is  tricuspid. Aortic valve regurgitation is moderate. No aortic stenosis is  present.   8. Difficult TEE images, seen best supine.     EKG:  EKG is not ordered today.    Recent Labs: 06/14/2021: B Natriuretic Peptide >4,500.0 06/27/2021: Magnesium 2.6 07/12/2021: Platelets 155 11/14/2021: Hemoglobin 12.6 11/21/2021: ALT 25; BUN 35; Creatinine, Ser 5.94; Potassium 4.7; Sodium 140; TSH 2.751  Recent Lipid Panel  Component Value Date/Time   CHOL 129 07/23/2021 1438   CHOL 202 (H) 02/18/2021 1148   TRIG 82 07/23/2021 1438   HDL 37 (L) 07/23/2021 1438   HDL 55 02/18/2021 1148   CHOLHDL 3.5 07/23/2021 1438   VLDL 16 07/23/2021 1438   LDLCALC 76 07/23/2021 1438   LDLCALC 125 (H) 02/18/2021 1148     Risk Assessment/Calculations:    CHA2DS2-VASc Score = 5   This indicates a 7.2% annual risk of stroke. The patient's score is based upon: CHF History: 1 HTN History: 1 Diabetes History: 0 Stroke History: 0 Vascular Disease History: 1 (suspected MI this admission) Age Score: 2 Gender Score: 0          Physical Exam:    VS:  BP (!) 102/58   Pulse 79   Ht '5\' 10"'$  (1.778 m)   Wt 157 lb 6.4 oz (71.4 kg)   SpO2 100%   BMI 22.58 kg/m     Wt Readings from Last 3 Encounters:  11/26/21 157 lb 6.4 oz (71.4 kg)  11/21/21 154 lb 12.8 oz (70.2 kg)  10/31/21 155 lb (70.3 kg)     GEN:  Elderly appearing male in no acute distress HEENT: Normal NECK: No JVD; No carotid bruits LYMPHATICS: No lymphadenopathy CARDIAC: RRR, 2/6 HSM at the apex RESPIRATORY:  Clear to auscultation without rales, wheezing or rhonchi  ABDOMEN: Soft, non-tender, non-distended MUSCULOSKELETAL:  No edema; No deformity  SKIN: Warm and dry NEUROLOGIC:  Alert and oriented x 3 PSYCHIATRIC:  Normal affect   ASSESSMENT:    1. Nonrheumatic mitral valve regurgitation    PLAN:    In order of problems listed above:  The patient has severe, symptomatic mitral regurgitation on a background of ischemic heart disease, chronic systolic heart failure with LVEF less than 30%, and end-stage renal disease.  He appears to have New York Heart  Association functional class II symptoms.  I have reviewed the natural history of mitral regurgitation with the patient and their family members who are present today. We have discussed the limitations of medical therapy and the poor prognosis associated with symptomatic mitral regurgitation. We have also reviewed potential treatment options, including palliative medical therapy, conventional surgical mitral valve repair or replacement, and percutaneous mitral valve therapies such as edge-to-edge mitral valve approximation with MitraClip.  The patient has already been turned down for cardiac surgery.  We discussed treatment options in the context of this patient's specific comorbid medical conditions.  Etiology of the patient's mitral regurgitation may be related to leaflet mall coaptation with annular dilatation and also component of posterior leaflet restriction (Carpentier type I and IIIb dysfunction).  TEE imaging is difficult.  Our structural heart team is has some discussion about repeat imaging under general anesthesia and even with paralytics as this can improve the mitral valve imaging to allow for procedural guidance for transcatheter edge-to-edge repair.  Considering the patient's comorbidities, poor functional capacity, and challenging imaging, I do not think it is a good idea to move forward with transcatheter mitral valve repair at this time.  He does not have congestive symptoms and I do not feel confident that even a good result with MitraClip will make a big difference in his overall clinical course.  I think it would be very challenging to get a good result with the imaging limitations and I have some concerns about being able to do this safely.  The patient and his daughter understand and agree with a conservative approach and ongoing  medical therapy as tolerated.     Cardiac Rehabilitation Eligibility Assessment            Medication Adjustments/Labs and Tests Ordered: Current  medicines are reviewed at length with the patient today.  Concerns regarding medicines are outlined above.  No orders of the defined types were placed in this encounter.  No orders of the defined types were placed in this encounter.   Patient Instructions  Medication Instructions:   *If you need a refill on your cardiac medications before your next appointment, please call your pharmacy*   Lab Work:  If you have labs (blood work) drawn today and your tests are completely normal, you will receive your results only by: [place] (if you have MyChart) OR A paper copy in the mail If you have any lab test that is abnormal or we need to change your treatment, we will call you to review the results.   Testing/Procedures:   Follow-Up: At [facility], you and your health needs are our priority.  As part of our continuing mission to provide you with exceptional heart care, we have created designated Provider Care Teams.  These Care Teams include your primary Cardiologist (physician) and Advanced Practice Providers (APPs -  Physician Assistants and Nurse Practitioners) who all work together to provide you with the care you need, when you need it.  We recommend signing up for the patient portal called "MyChart".  Sign up information is provided on this After Visit Summary.  MyChart is used to connect with patients for Virtual Visits (Telemedicine).  Patients are able to view lab/test results, encounter notes, upcoming appointments, etc.  Non-urgent messages can be sent to your provider as well.   To learn more about what you can do with MyChart, go to [email or web address].    Your next appointment:  as needed  Important Information About Sugar         Signed, Sherren Mocha, MD  11/26/2021 5:31 PM    [place]

## 2021-11-26 NOTE — Patient Instructions (Signed)
Medication Instructions:   *If you need a refill on your cardiac medications before your next appointment, please call your pharmacy*   Lab Work:  If you have labs (blood work) drawn today and your tests are completely normal, you will receive your results only by: [place] (if you have MyChart) OR A paper copy in the mail If you have any lab test that is abnormal or we need to change your treatment, we will call you to review the results.   Testing/Procedures:   Follow-Up: At [facility], you and your health needs are our priority.  As part of our continuing mission to provide you with exceptional heart care, we have created designated Provider Care Teams.  These Care Teams include your primary Cardiologist (physician) and Advanced Practice Providers (APPs -  Physician Assistants and Nurse Practitioners) who all work together to provide you with the care you need, when you need it.  We recommend signing up for the patient portal called "MyChart".  Sign up information is provided on this After Visit Summary.  MyChart is used to connect with patients for Virtual Visits (Telemedicine).  Patients are able to view lab/test results, encounter notes, upcoming appointments, etc.  Non-urgent messages can be sent to your provider as well.   To learn more about what you can do with MyChart, go to [email or web address].    Your next appointment:  as needed  Important Information About Sugar

## 2021-11-27 DIAGNOSIS — N2581 Secondary hyperparathyroidism of renal origin: Secondary | ICD-10-CM | POA: Diagnosis not present

## 2021-11-27 DIAGNOSIS — D689 Coagulation defect, unspecified: Secondary | ICD-10-CM | POA: Diagnosis not present

## 2021-11-27 DIAGNOSIS — L299 Pruritus, unspecified: Secondary | ICD-10-CM | POA: Diagnosis not present

## 2021-11-27 DIAGNOSIS — N186 End stage renal disease: Secondary | ICD-10-CM | POA: Diagnosis not present

## 2021-11-27 DIAGNOSIS — Z992 Dependence on renal dialysis: Secondary | ICD-10-CM | POA: Diagnosis not present

## 2021-11-27 DIAGNOSIS — D509 Iron deficiency anemia, unspecified: Secondary | ICD-10-CM | POA: Diagnosis not present

## 2021-11-28 DIAGNOSIS — E43 Unspecified severe protein-calorie malnutrition: Secondary | ICD-10-CM | POA: Diagnosis not present

## 2021-11-28 DIAGNOSIS — I5021 Acute systolic (congestive) heart failure: Secondary | ICD-10-CM | POA: Diagnosis not present

## 2021-11-28 DIAGNOSIS — R1013 Epigastric pain: Secondary | ICD-10-CM | POA: Diagnosis not present

## 2021-11-29 ENCOUNTER — Other Ambulatory Visit: Payer: Self-pay

## 2021-11-29 ENCOUNTER — Encounter (HOSPITAL_COMMUNITY): Payer: Self-pay | Admitting: Emergency Medicine

## 2021-11-29 ENCOUNTER — Emergency Department (HOSPITAL_COMMUNITY): Payer: Medicare Other

## 2021-11-29 ENCOUNTER — Emergency Department (HOSPITAL_COMMUNITY)
Admission: EM | Admit: 2021-11-29 | Discharge: 2021-11-29 | Disposition: A | Discharge status: Home / Self Care | Payer: Medicare Other | Attending: Emergency Medicine | Admitting: Emergency Medicine

## 2021-11-29 DIAGNOSIS — K5120 Ulcerative (chronic) proctitis without complications: Secondary | ICD-10-CM | POA: Diagnosis not present

## 2021-11-29 DIAGNOSIS — K6289 Other specified diseases of anus and rectum: Secondary | ICD-10-CM | POA: Insufficient documentation

## 2021-11-29 DIAGNOSIS — R109 Unspecified abdominal pain: Secondary | ICD-10-CM | POA: Diagnosis not present

## 2021-11-29 DIAGNOSIS — E875 Hyperkalemia: Secondary | ICD-10-CM | POA: Diagnosis not present

## 2021-11-29 DIAGNOSIS — R9431 Abnormal electrocardiogram [ECG] [EKG]: Secondary | ICD-10-CM | POA: Diagnosis not present

## 2021-11-29 DIAGNOSIS — R079 Chest pain, unspecified: Secondary | ICD-10-CM | POA: Diagnosis not present

## 2021-11-29 DIAGNOSIS — Z7901 Long term (current) use of anticoagulants: Secondary | ICD-10-CM | POA: Diagnosis not present

## 2021-11-29 DIAGNOSIS — K5900 Constipation, unspecified: Secondary | ICD-10-CM | POA: Diagnosis present

## 2021-11-29 DIAGNOSIS — J9811 Atelectasis: Secondary | ICD-10-CM | POA: Diagnosis not present

## 2021-11-29 LAB — CBC WITH DIFFERENTIAL/PLATELET
Abs Immature Granulocytes: 0.02 10*3/uL (ref 0.00–0.07)
Basophils Absolute: 0 10*3/uL (ref 0.0–0.1)
Basophils Relative: 0 %
Eosinophils Absolute: 0 10*3/uL (ref 0.0–0.5)
Eosinophils Relative: 0 %
HCT: 33.4 % — ABNORMAL LOW (ref 39.0–52.0)
Hemoglobin: 10.9 g/dL — ABNORMAL LOW (ref 13.0–17.0)
Immature Granulocytes: 0 %
Lymphocytes Relative: 12 %
Lymphs Abs: 0.8 10*3/uL (ref 0.7–4.0)
MCH: 30.4 pg (ref 26.0–34.0)
MCHC: 32.6 g/dL (ref 30.0–36.0)
MCV: 93.3 fL (ref 80.0–100.0)
Monocytes Absolute: 0.7 10*3/uL (ref 0.1–1.0)
Monocytes Relative: 11 %
Neutro Abs: 5 10*3/uL (ref 1.7–7.7)
Neutrophils Relative %: 77 %
Platelets: 152 10*3/uL (ref 150–400)
RBC: 3.58 MIL/uL — ABNORMAL LOW (ref 4.22–5.81)
RDW: 20.2 % — ABNORMAL HIGH (ref 11.5–15.5)
WBC: 6.5 10*3/uL (ref 4.0–10.5)
nRBC: 0 % (ref 0.0–0.2)

## 2021-11-29 LAB — COMPREHENSIVE METABOLIC PANEL
ALT: 27 U/L (ref 0–44)
AST: 25 U/L (ref 15–41)
Albumin: 3.5 g/dL (ref 3.5–5.0)
Alkaline Phosphatase: 70 U/L (ref 38–126)
Anion gap: 15 (ref 5–15)
BUN: 49 mg/dL — ABNORMAL HIGH (ref 8–23)
CO2: 27 mmol/L (ref 22–32)
Calcium: 9.8 mg/dL (ref 8.9–10.3)
Chloride: 94 mmol/L — ABNORMAL LOW (ref 98–111)
Creatinine, Ser: 7.5 mg/dL — ABNORMAL HIGH (ref 0.61–1.24)
GFR, Estimated: 7 mL/min — ABNORMAL LOW (ref >60)
Glucose, Bld: 98 mg/dL (ref 70–99)
Potassium: 6.2 mmol/L — ABNORMAL HIGH (ref 3.5–5.1)
Sodium: 136 mmol/L (ref 135–145)
Total Bilirubin: 1.3 mg/dL — ABNORMAL HIGH (ref 0.3–1.2)
Total Protein: 6.9 g/dL (ref 6.5–8.1)

## 2021-11-29 LAB — LIPASE, BLOOD: Lipase: 46 U/L (ref 11–51)

## 2021-11-29 LAB — TROPONIN I (HIGH SENSITIVITY)
Troponin I (High Sensitivity): 27 ng/L — ABNORMAL HIGH (ref <18)
Troponin I (High Sensitivity): 27 ng/L — ABNORMAL HIGH (ref <18)

## 2021-11-29 MED ORDER — SODIUM ZIRCONIUM CYCLOSILICATE 10 G PO PACK
10.0000 g | PACK | Freq: Once | ORAL | Status: AC
  Administered 2021-11-29: 10 g via ORAL
  Filled 2021-11-29: qty 1

## 2021-11-29 MED ORDER — DILTIAZEM GEL 2 %: 1.0000 | Freq: Two times a day (BID) | CUTANEOUS | 1 refills | Status: DC

## 2021-11-29 MED ORDER — HYDROCORTISONE ACETATE 25 MG RE SUPP: 25.0000 mg | Freq: Two times a day (BID) | RECTAL | 0 refills | Status: DC

## 2021-11-29 NOTE — ED Triage Notes (Signed)
Patient here with complaint of constipation that started five days ago, patient takes a stool softener daily and has also tried prune juice and miralax with no relief. Patient is alert, oriented, and in no apparent distress at this time.

## 2021-11-29 NOTE — ED Provider Notes (Signed)
[facility] EMERGENCY DEPARTMENT Provider Note   CSN: [id number] Arrival date & time: 11/29/21  9470     History  Chief Complaint  Patient presents with   Constipation    Ruben [name] is a 76 y.o. male.  Patient with history of MI and currently on dialysis (M/W/F at [facility]) who presets to the ED with concern of abdominal pain and constipation x about 7 days. Patient had admission 06/2021 and flex sigmoidoscopy at that time revealed fecal impaction/rectal ulceration and proctitis, biopsy showed focal erosions, treated with manual disimpaction, topical diltiazem, miralax bid, Anusol suppository. He reports the abdominal pain is located in his upper left quadrant and lower left quadrant. Also has rectal pressure. He reports last remarkable bowel movement about 7 days ago with a "small" hard bowel movement about an hour ago. He reports taking miralax and prune juice with little benefit. He does reports decreasing his dose of miralax form twice today to once a day a couple of weeks ago. He denies chest pain, shortness of breath, diaphoresis. He denies black stool or blood in his stool. He reports he is not staying very hydrated. He also notes he has not taken any of his medications today and is due to dialysis at 10am today.        Home Medications Prior to Admission medications   Medication Sig Start Date End Date Taking? Authorizing Provider  acetaminophen (TYLENOL) 650 MG CR tablet Take 650 mg by mouth every 8 (eight) hours as needed for pain.    [provider]  albuterol (PROVENTIL) (2.5 MG/3ML) 0.083% nebulizer solution Take 3 mLs (2.5 mg total) by nebulization every 4 (four) hours as needed for wheezing or shortness of breath. 04/03/21 04/03/22  [name], FNP  ALPRAZolam Duanne Moron) 0.25 MG tablet Take 1 tablet (0.25 mg total) by mouth daily as needed for anxiety (anxiety with dialysis). 09/17/21 09/17/22  Larey Dresser, MD  amiodarone  (PACERONE) 200 MG tablet Take 1 tablet (200 mg total) by mouth daily. 11/11/21   Larey Dresser, MD  apixaban (ELIQUIS) 5 MG TABS tablet Take 1 tablet (5 mg total) by mouth 2 (two) times daily. 08/26/21   Milford, Maricela Bo, FNP  atorvastatin (LIPITOR) 80 MG tablet Take 1 tablet (80 mg total) by mouth daily. 11/21/21   Joette Catching, PA-C  cetirizine (ZYRTEC) 10 MG tablet Take 10 mg by mouth daily as needed for allergies.    [provider]  cholecalciferol (VITAMIN D3) 25 MCG (1000 UNIT) tablet Take 1,000 Units by mouth daily.    [provider]  diazepam (VALIUM) 5 MG tablet Take 5 mg by mouth as needed. 10/22/21   [provider]  gabapentin (NEURONTIN) 100 MG capsule 1 capsule 11/14/21   [provider]  hydrOXYzine (ATARAX) 10 MG tablet Take 10 mg by mouth at bedtime.    [provider]  lidocaine-prilocaine (EMLA) cream Apply 1 Application topically as needed.    [provider]  magnesium oxide (MAG-OX) 400 (240 Mg) MG tablet Take 400 mg by mouth daily. 07/05/21   [provider]  metoCLOPramide (REGLAN) 5 MG tablet Take 1 tablet (5 mg total) by mouth every 8 (eight) hours as needed for nausea. 07/12/21   Couture, Cortni S, PA-C  midodrine (PROAMATINE) 10 MG tablet Take 1.5 tablets (15 mg total) by mouth 3 (three) times daily with meals. 11/21/21   Joette Catching, PA-C  mirtazapine (REMERON) 15 MG tablet Take 15  mg by mouth at bedtime. 09/17/21   [provider]  mirtazapine (REMERON) 30 MG tablet Take 30 mg by mouth at bedtime. 10/15/21   [provider]  Multiple Vitamins-Minerals (MULTIVITAMIN WITH MINERALS) tablet Take 1 tablet by mouth daily.    [provider]  nitroGLYCERIN (NITROSTAT) 0.4 MG SL tablet Place 1 tablet (0.4 mg total) under the tongue every 5 (five) minutes as needed for chest pain. 05/28/21 05/28/22  Clegg, Amy D, NP  omeprazole (PRILOSEC) 40 MG capsule 1 capsule 30 minutes  before morning meal    [provider]  ondansetron (ZOFRAN) 4 MG tablet Take 1 tablet (4 mg total) by mouth daily as needed for nausea or vomiting. 06/04/21 06/04/22  [name], FNP  phenylephrine (,USE FOR PREPARATION-H,) 0.25 % suppository Place 1 suppository rectally as needed for hemorrhoids.    [provider]  polyethylene glycol powder (MIRALAX) 17 GM/SCOOP powder See admin instructions.    [provider]  prochlorperazine (COMPAZINE) 5 MG tablet Take 5 mg by mouth 3 (three) times daily as needed. 09/05/21   [provider]  Propylene Glycol (SYSTANE COMPLETE) 0.6 % SOLN Place 1 drop into both eyes as needed (dry eyes).    [provider]  sertraline (ZOLOFT) 25 MG tablet Take 25 mg by mouth daily. 10/23/21   [provider]      Allergies    Other    Review of Systems   Review of Systems  Physical Exam Updated Vital Signs BP (!) 100/54 (BP Location: Right Arm)   Pulse 76   Temp 97.7 F (36.5 C) (Oral)   Resp 14   SpO2 96%   Physical Exam Vitals and nursing note reviewed. Exam conducted with a chaperone present.  Constitutional:      General: He is not in acute distress.    Appearance: He is well-developed.  HENT:     Head: Normocephalic and atraumatic.  Eyes:     General:        Right eye: No discharge.        Left eye: No discharge.     Conjunctiva/sclera: Conjunctivae normal.  Cardiovascular:     Rate and Rhythm: Normal rate and regular rhythm.     Heart sounds: Normal heart sounds.  Pulmonary:     Effort: Pulmonary effort is normal.     Breath sounds: Normal breath sounds.  Abdominal:     Palpations: Abdomen is soft.     Tenderness: There is no abdominal tenderness.  Genitourinary:    Rectum: Tenderness present. No mass, external hemorrhoid or internal hemorrhoid.  Musculoskeletal:     Cervical back: Normal range of motion and neck supple.  Skin:    General: Skin is warm and dry.  Neurological:      Mental Status: He is alert.     ED Results / Procedures / Treatments   Labs (all labs ordered are listed, but only abnormal results are displayed) Labs Reviewed  CBC WITH DIFFERENTIAL/PLATELET - Abnormal; Notable for the following components:      Result Value   RBC 3.58 (*)    Hemoglobin 10.9 (*)    HCT 33.4 (*)    RDW 20.2 (*)    All other components within normal limits  COMPREHENSIVE METABOLIC PANEL - Abnormal; Notable for the following components:   Potassium 6.2 (*)    Chloride 94 (*)    BUN 49 (*)    Creatinine, Ser 7.50 (*)    Total Bilirubin  1.3 (*)    GFR, Estimated 7 (*)    All other components within normal limits  TROPONIN I (HIGH SENSITIVITY) - Abnormal; Notable for the following components:   Troponin I (High Sensitivity) 27 (*)    All other components within normal limits  TROPONIN I (HIGH SENSITIVITY) - Abnormal; Notable for the following components:   Troponin I (High Sensitivity) 27 (*)    All other components within normal limits  LIPASE, BLOOD    EKG EKG Interpretation  Date/Time:  Friday November 29 2021 10:03:01 EDT Ventricular Rate:  75 PR Interval:  260 QRS Duration: 113 QT Interval:  441 QTC Calculation: 493 R Axis:   81 Text Interpretation: Sinus rhythm Prolonged PR interval Borderline intraventricular conduction delay Borderline prolonged QT interval Confirmed by Octaviano Glow [phone]) on 11/29/2021 10:08:06 AM  Radiology DG Chest 2 View  Result Date: 11/29/2021 CLINICAL DATA:  CP/epigastric pain EXAM: CHEST - 2 VIEW COMPARISON:  July 12, 2021 FINDINGS: Again seen is the right transjugular dialysis catheter with its tip in the proximal right atrium. Mild cardiomegaly. There is some atelectasis seen at the left lung base which appears more prominent in the present study. No pulmonary vascular congestion. The visualized skeletal structures are unremarkable. IMPRESSION: Mild cardiomegaly and atelectasis at the left lung base. Electronically Signed    By: Frazier Richards M.D.   On: 11/29/2021 10:00    Procedures Procedures    Medications Ordered in ED Medications  sodium zirconium cyclosilicate (LOKELMA) packet 10 g (10 g Oral Given 11/29/21 1318)  sodium zirconium cyclosilicate (LOKELMA) packet 10 g (10 g Oral Provided for home use 11/29/21 1320)    ED Course/ Medical Decision Making/ A&P Clinical Course as of 11/29/21 1052  Fri Nov 29, 2021  1038 This is a 43 old male with a history of end-stage renal disease on dialysis presenting to the ED with rectal pressure, constipation, reports has not had a regular bowel movement in several days, and only small bowel movement earlier today.  He has no significant abdominal distention or pain or tenderness on exam.  Differential diagnosis would include fecal impaction versus ileus versus bowel obstruction.  He does take MiraLAX typically to help with bowel movements and has been "scaling back" on his dosing because he had too many bowel movements. [MT]    Clinical Course User Index [MT] Trifan, Carola Rhine, MD   Patient seen and examined. History obtained directly from patient and family at bedside.  Also reviewed previous hospitalization records and imaging results.  Labs/EKG: Ordered CBC, CMP, lipase, troponin.  Imaging: Ordered chest x-ray.  Medications/Fluids: None ordered.  Most recent vital signs reviewed and are as follows: BP 108/64   Pulse 73   Temp 97.6 F (36.4 C) (Oral)   Resp (!) 25   SpO2 97%   Patient discussed with and seen by Dr. Langston Masker.  Initial impression: Abdominal pain, question of fecal impaction vs recurrent proctitis.   Rectal exam performed with chaperone. No fecal impaction. Patient quite tender on exam raising concern for proctitis or recurrent ulceration.  Added CT abdomen and pelvis without contrast.     Reassessment performed. Patient appears comfortable, resting.   Labs personally reviewed and interpreted including: CBC with normal white blood cell  count, baseline hemoglobin at 10.9; CMP with hyperkalemia 6.2, normal glucose, creatinine 7.5 with a BUN of 49; lipase normal; first troponin 27.  Imaging personally visualized and interpreted including: CT of the abdomen and pelvis, agree signs of rectal wall  thickening, mild ascites.  Reviewed pertinent lab work and imaging with patient and family at bedside. Questions answered.   Most current vital signs reviewed and are as follows: BP 108/64   Pulse 73   Temp 97.6 F (36.4 C) (Oral)   Resp (!) 25   SpO2 97%   Plan: I consulted with Dr. Jonnie Finner of nephrology who has provided recommendations and spoken with dialysis social worker.  Arrangements made for patient to be dialyzed tomorrow at 7:30 AM.  In regards to hyperkalemia, will give a dose of Lokelma 10 g here and provide a dose for home to be taken this evening before bed.  In regards to the rectal pain, proctitis suspected: We will continue MiraLAX up to twice a day so that the stools are soft and easily passable without straining, add hydrocortisone rectal suppositories, and provide prescription for topical diltiazem.  Patient's family member is aware that diltiazem will need to be filled by compounding pharmacy.  They have been able to obtain this in the past.  Currently awaiting second troponin.  2:21 PM Reassessment performed. Patient appears comfortable.  Reviewed plan and nephrology recommendations with patient and family member at bedside.    Labs personally reviewed and interpreted including: Repeat troponin flat at 27  Reviewed pertinent lab work and imaging with patient at bedside. Questions answered.   Most current vital signs reviewed and are as follows: BP 108/64   Pulse 73   Temp 97.6 F (36.4 C) (Oral)   Resp (!) 25   SpO2 97%   Plan: Discharge to home.   Prescriptions written for: Anusol suppository, topical diltiazem  ED return instructions discussed: Worsening pain, vomiting, fevers  Follow-up  instructions discussed: Encouraged follow-up with dialysis tomorrow as scheduled.                           Medical Decision Making Amount and/or Complexity of Data Reviewed Labs: ordered. Radiology: ordered.  Risk Prescription drug management.   For this patient's complaint of abdominal pain, the following conditions were considered on the differential diagnosis: gastritis/PUD, enteritis/duodenitis, appendicitis, cholelithiasis/cholecystitis, cholangitis, pancreatitis, ruptured viscus, colitis, diverticulitis, small/large bowel obstruction, proctitis, cystitis, pyelonephritis, ureteral colic, aortic dissection, aortic aneurysm. Atypical chest etiologies were also considered including ACS, PE, and pneumonia.   Patient with hyperkalemia without EKG changes due to ESRD status.  Missed dialysis today.  Arrangements for temporizing measures as above and for dialysis tomorrow.  No clinical signs of fluid overload, hypoxia, dyspnea.  Chest x-ray is clear.  No indications for admission at this time.  The patient's vital signs, pertinent lab work and imaging were reviewed and interpreted as discussed in the ED course. Hospitalization was considered for further testing, treatments, or serial exams/observation. However as patient is well-appearing, has a stable exam, and reassuring studies today, I do not feel that they warrant admission at this time. This plan was discussed with the patient who verbalizes agreement and comfort with this plan and seems reliable and able to return to the Emergency Department with worsening or changing symptoms.           Final Clinical Impression(s) / ED Diagnoses Final diagnoses:  Proctitis  Hyperkalemia    Rx / DC Orders ED Discharge Orders          Ordered    hydrocortisone (ANUSOL-HC) 25 MG suppository  2 times daily        11/29/21 1305    diltiazem 2 % GEL  2 times daily        11/29/21 1305              Carlisle Cater, PA-C 11/29/21  1433    Wyvonnia Dusky, MD 11/29/21 1538

## 2021-11-29 NOTE — Discharge Instructions (Addendum)
Your lab work-up today did not show signs of any infections or problems with your heart.  Your potassium was high today because you are due for dialysis.  Please take the extra dose of Lokelma this evening as directed to control your potassium.  The CT scan of the abdomen and pelvis performed today is suggestive of metabolic proctitis.  No signs of bowel obstructions or significant infection.  Please continue to use the MiraLAX, 1-2 times a day, to keep your stools soft.  I have prescribed rectal steroid and topical diltiazem for this.  Please contact your primary care doctor and/or GI for follow-up.

## 2021-11-29 NOTE — Progress Notes (Signed)
Contacted by nephrologist regarding pt needing a treatment tomorrow at clinic due to pt being in the ED today during normal treatment time. Contacted [facility] and spoke to [place]. Clinic can treat pt tomorrow. Pt will need to arrive tomorrow at 7:10 for 7:30 chair time. Update provided to nephrologist who updated ED staff. Unable to reach pt via phone. Spoke to pt's dtr, Reba [phone]), via phone to make pt/family aware of pt's appt time for tomorrow. Dtr aware and agreeable to plan. Appt added to AVS as well. Contacted Debra at [facility] to make clinic aware pt will be at appt tomorrow.   Melven Sartorius Renal Navigator [phone]

## 2021-11-30 DIAGNOSIS — D509 Iron deficiency anemia, unspecified: Secondary | ICD-10-CM | POA: Diagnosis not present

## 2021-11-30 DIAGNOSIS — Z992 Dependence on renal dialysis: Secondary | ICD-10-CM | POA: Diagnosis not present

## 2021-11-30 DIAGNOSIS — L299 Pruritus, unspecified: Secondary | ICD-10-CM | POA: Diagnosis not present

## 2021-11-30 DIAGNOSIS — N2581 Secondary hyperparathyroidism of renal origin: Secondary | ICD-10-CM | POA: Diagnosis not present

## 2021-11-30 DIAGNOSIS — D689 Coagulation defect, unspecified: Secondary | ICD-10-CM | POA: Diagnosis not present

## 2021-11-30 DIAGNOSIS — N186 End stage renal disease: Secondary | ICD-10-CM | POA: Diagnosis not present

## 2021-12-02 DIAGNOSIS — N179 Acute kidney failure, unspecified: Secondary | ICD-10-CM | POA: Diagnosis not present

## 2021-12-02 DIAGNOSIS — D509 Iron deficiency anemia, unspecified: Secondary | ICD-10-CM | POA: Diagnosis not present

## 2021-12-02 DIAGNOSIS — D689 Coagulation defect, unspecified: Secondary | ICD-10-CM | POA: Diagnosis not present

## 2021-12-02 DIAGNOSIS — Z992 Dependence on renal dialysis: Secondary | ICD-10-CM | POA: Diagnosis not present

## 2021-12-02 DIAGNOSIS — N2581 Secondary hyperparathyroidism of renal origin: Secondary | ICD-10-CM | POA: Diagnosis not present

## 2021-12-02 DIAGNOSIS — N186 End stage renal disease: Secondary | ICD-10-CM | POA: Diagnosis not present

## 2021-12-02 DIAGNOSIS — L299 Pruritus, unspecified: Secondary | ICD-10-CM | POA: Diagnosis not present

## 2021-12-04 DIAGNOSIS — N186 End stage renal disease: Secondary | ICD-10-CM | POA: Diagnosis not present

## 2021-12-04 DIAGNOSIS — D689 Coagulation defect, unspecified: Secondary | ICD-10-CM | POA: Diagnosis not present

## 2021-12-04 DIAGNOSIS — Z992 Dependence on renal dialysis: Secondary | ICD-10-CM | POA: Diagnosis not present

## 2021-12-04 DIAGNOSIS — D509 Iron deficiency anemia, unspecified: Secondary | ICD-10-CM | POA: Diagnosis not present

## 2021-12-04 DIAGNOSIS — N2581 Secondary hyperparathyroidism of renal origin: Secondary | ICD-10-CM | POA: Diagnosis not present

## 2021-12-04 DIAGNOSIS — D631 Anemia in chronic kidney disease: Secondary | ICD-10-CM | POA: Diagnosis not present

## 2021-12-06 DIAGNOSIS — N2581 Secondary hyperparathyroidism of renal origin: Secondary | ICD-10-CM | POA: Diagnosis not present

## 2021-12-06 DIAGNOSIS — N186 End stage renal disease: Secondary | ICD-10-CM | POA: Diagnosis not present

## 2021-12-06 DIAGNOSIS — Z992 Dependence on renal dialysis: Secondary | ICD-10-CM | POA: Diagnosis not present

## 2021-12-06 DIAGNOSIS — D631 Anemia in chronic kidney disease: Secondary | ICD-10-CM | POA: Diagnosis not present

## 2021-12-06 DIAGNOSIS — D689 Coagulation defect, unspecified: Secondary | ICD-10-CM | POA: Diagnosis not present

## 2021-12-06 DIAGNOSIS — D509 Iron deficiency anemia, unspecified: Secondary | ICD-10-CM | POA: Diagnosis not present

## 2021-12-09 DIAGNOSIS — N2581 Secondary hyperparathyroidism of renal origin: Secondary | ICD-10-CM | POA: Diagnosis not present

## 2021-12-09 DIAGNOSIS — N186 End stage renal disease: Secondary | ICD-10-CM | POA: Diagnosis not present

## 2021-12-09 DIAGNOSIS — D509 Iron deficiency anemia, unspecified: Secondary | ICD-10-CM | POA: Diagnosis not present

## 2021-12-09 DIAGNOSIS — D689 Coagulation defect, unspecified: Secondary | ICD-10-CM | POA: Diagnosis not present

## 2021-12-09 DIAGNOSIS — Z992 Dependence on renal dialysis: Secondary | ICD-10-CM | POA: Diagnosis not present

## 2021-12-09 DIAGNOSIS — D631 Anemia in chronic kidney disease: Secondary | ICD-10-CM | POA: Diagnosis not present

## 2021-12-11 DIAGNOSIS — N186 End stage renal disease: Secondary | ICD-10-CM | POA: Diagnosis not present

## 2021-12-11 DIAGNOSIS — N2581 Secondary hyperparathyroidism of renal origin: Secondary | ICD-10-CM | POA: Diagnosis not present

## 2021-12-11 DIAGNOSIS — D689 Coagulation defect, unspecified: Secondary | ICD-10-CM | POA: Diagnosis not present

## 2021-12-11 DIAGNOSIS — Z992 Dependence on renal dialysis: Secondary | ICD-10-CM | POA: Diagnosis not present

## 2021-12-11 DIAGNOSIS — D631 Anemia in chronic kidney disease: Secondary | ICD-10-CM | POA: Diagnosis not present

## 2021-12-11 DIAGNOSIS — D509 Iron deficiency anemia, unspecified: Secondary | ICD-10-CM | POA: Diagnosis not present

## 2021-12-13 DIAGNOSIS — D689 Coagulation defect, unspecified: Secondary | ICD-10-CM | POA: Diagnosis not present

## 2021-12-13 DIAGNOSIS — Z992 Dependence on renal dialysis: Secondary | ICD-10-CM | POA: Diagnosis not present

## 2021-12-13 DIAGNOSIS — N2581 Secondary hyperparathyroidism of renal origin: Secondary | ICD-10-CM | POA: Diagnosis not present

## 2021-12-13 DIAGNOSIS — D631 Anemia in chronic kidney disease: Secondary | ICD-10-CM | POA: Diagnosis not present

## 2021-12-13 DIAGNOSIS — D509 Iron deficiency anemia, unspecified: Secondary | ICD-10-CM | POA: Diagnosis not present

## 2021-12-13 DIAGNOSIS — N186 End stage renal disease: Secondary | ICD-10-CM | POA: Diagnosis not present

## 2021-12-16 DIAGNOSIS — D689 Coagulation defect, unspecified: Secondary | ICD-10-CM | POA: Diagnosis not present

## 2021-12-16 DIAGNOSIS — D631 Anemia in chronic kidney disease: Secondary | ICD-10-CM | POA: Diagnosis not present

## 2021-12-16 DIAGNOSIS — D509 Iron deficiency anemia, unspecified: Secondary | ICD-10-CM | POA: Diagnosis not present

## 2021-12-16 DIAGNOSIS — N186 End stage renal disease: Secondary | ICD-10-CM | POA: Diagnosis not present

## 2021-12-16 DIAGNOSIS — N2581 Secondary hyperparathyroidism of renal origin: Secondary | ICD-10-CM | POA: Diagnosis not present

## 2021-12-16 DIAGNOSIS — Z992 Dependence on renal dialysis: Secondary | ICD-10-CM | POA: Diagnosis not present

## 2021-12-18 DIAGNOSIS — D631 Anemia in chronic kidney disease: Secondary | ICD-10-CM | POA: Diagnosis not present

## 2021-12-18 DIAGNOSIS — N2581 Secondary hyperparathyroidism of renal origin: Secondary | ICD-10-CM | POA: Diagnosis not present

## 2021-12-18 DIAGNOSIS — D509 Iron deficiency anemia, unspecified: Secondary | ICD-10-CM | POA: Diagnosis not present

## 2021-12-18 DIAGNOSIS — N186 End stage renal disease: Secondary | ICD-10-CM | POA: Diagnosis not present

## 2021-12-18 DIAGNOSIS — Z992 Dependence on renal dialysis: Secondary | ICD-10-CM | POA: Diagnosis not present

## 2021-12-18 DIAGNOSIS — D689 Coagulation defect, unspecified: Secondary | ICD-10-CM | POA: Diagnosis not present

## 2021-12-19 DIAGNOSIS — I480 Paroxysmal atrial fibrillation: Secondary | ICD-10-CM | POA: Diagnosis not present

## 2021-12-19 DIAGNOSIS — E7800 Pure hypercholesterolemia, unspecified: Secondary | ICD-10-CM | POA: Diagnosis not present

## 2021-12-19 DIAGNOSIS — I950 Idiopathic hypotension: Secondary | ICD-10-CM | POA: Diagnosis not present

## 2021-12-19 DIAGNOSIS — N186 End stage renal disease: Secondary | ICD-10-CM | POA: Diagnosis not present

## 2021-12-20 DIAGNOSIS — D689 Coagulation defect, unspecified: Secondary | ICD-10-CM | POA: Diagnosis not present

## 2021-12-20 DIAGNOSIS — N186 End stage renal disease: Secondary | ICD-10-CM | POA: Diagnosis not present

## 2021-12-20 DIAGNOSIS — Z992 Dependence on renal dialysis: Secondary | ICD-10-CM | POA: Diagnosis not present

## 2021-12-20 DIAGNOSIS — D631 Anemia in chronic kidney disease: Secondary | ICD-10-CM | POA: Diagnosis not present

## 2021-12-20 DIAGNOSIS — N2581 Secondary hyperparathyroidism of renal origin: Secondary | ICD-10-CM | POA: Diagnosis not present

## 2021-12-20 DIAGNOSIS — D509 Iron deficiency anemia, unspecified: Secondary | ICD-10-CM | POA: Diagnosis not present

## 2021-12-23 DIAGNOSIS — D509 Iron deficiency anemia, unspecified: Secondary | ICD-10-CM | POA: Diagnosis not present

## 2021-12-23 DIAGNOSIS — D631 Anemia in chronic kidney disease: Secondary | ICD-10-CM | POA: Diagnosis not present

## 2021-12-23 DIAGNOSIS — N2581 Secondary hyperparathyroidism of renal origin: Secondary | ICD-10-CM | POA: Diagnosis not present

## 2021-12-23 DIAGNOSIS — Z992 Dependence on renal dialysis: Secondary | ICD-10-CM | POA: Diagnosis not present

## 2021-12-23 DIAGNOSIS — I5021 Acute systolic (congestive) heart failure: Secondary | ICD-10-CM | POA: Diagnosis not present

## 2021-12-23 DIAGNOSIS — N186 End stage renal disease: Secondary | ICD-10-CM | POA: Diagnosis not present

## 2021-12-23 DIAGNOSIS — E43 Unspecified severe protein-calorie malnutrition: Secondary | ICD-10-CM | POA: Diagnosis not present

## 2021-12-23 DIAGNOSIS — D689 Coagulation defect, unspecified: Secondary | ICD-10-CM | POA: Diagnosis not present

## 2021-12-25 DIAGNOSIS — N2581 Secondary hyperparathyroidism of renal origin: Secondary | ICD-10-CM | POA: Diagnosis not present

## 2021-12-25 DIAGNOSIS — D689 Coagulation defect, unspecified: Secondary | ICD-10-CM | POA: Diagnosis not present

## 2021-12-25 DIAGNOSIS — N186 End stage renal disease: Secondary | ICD-10-CM | POA: Diagnosis not present

## 2021-12-25 DIAGNOSIS — Z992 Dependence on renal dialysis: Secondary | ICD-10-CM | POA: Diagnosis not present

## 2021-12-25 DIAGNOSIS — D509 Iron deficiency anemia, unspecified: Secondary | ICD-10-CM | POA: Diagnosis not present

## 2021-12-25 DIAGNOSIS — D631 Anemia in chronic kidney disease: Secondary | ICD-10-CM | POA: Diagnosis not present

## 2021-12-26 DIAGNOSIS — I5021 Acute systolic (congestive) heart failure: Secondary | ICD-10-CM | POA: Diagnosis not present

## 2021-12-26 DIAGNOSIS — E43 Unspecified severe protein-calorie malnutrition: Secondary | ICD-10-CM | POA: Diagnosis not present

## 2021-12-27 DIAGNOSIS — D509 Iron deficiency anemia, unspecified: Secondary | ICD-10-CM | POA: Diagnosis not present

## 2021-12-27 DIAGNOSIS — D689 Coagulation defect, unspecified: Secondary | ICD-10-CM | POA: Diagnosis not present

## 2021-12-27 DIAGNOSIS — Z992 Dependence on renal dialysis: Secondary | ICD-10-CM | POA: Diagnosis not present

## 2021-12-27 DIAGNOSIS — D631 Anemia in chronic kidney disease: Secondary | ICD-10-CM | POA: Diagnosis not present

## 2021-12-27 DIAGNOSIS — N186 End stage renal disease: Secondary | ICD-10-CM | POA: Diagnosis not present

## 2021-12-27 DIAGNOSIS — N2581 Secondary hyperparathyroidism of renal origin: Secondary | ICD-10-CM | POA: Diagnosis not present

## 2021-12-29 DIAGNOSIS — E43 Unspecified severe protein-calorie malnutrition: Secondary | ICD-10-CM | POA: Diagnosis not present

## 2021-12-29 DIAGNOSIS — I5021 Acute systolic (congestive) heart failure: Secondary | ICD-10-CM | POA: Diagnosis not present

## 2021-12-30 DIAGNOSIS — D509 Iron deficiency anemia, unspecified: Secondary | ICD-10-CM | POA: Diagnosis not present

## 2021-12-30 DIAGNOSIS — D631 Anemia in chronic kidney disease: Secondary | ICD-10-CM | POA: Diagnosis not present

## 2021-12-30 DIAGNOSIS — D689 Coagulation defect, unspecified: Secondary | ICD-10-CM | POA: Diagnosis not present

## 2021-12-30 DIAGNOSIS — R251 Tremor, unspecified: Secondary | ICD-10-CM | POA: Diagnosis not present

## 2021-12-30 DIAGNOSIS — R220 Localized swelling, mass and lump, head: Secondary | ICD-10-CM | POA: Diagnosis not present

## 2021-12-30 DIAGNOSIS — N186 End stage renal disease: Secondary | ICD-10-CM | POA: Diagnosis not present

## 2021-12-30 DIAGNOSIS — N2581 Secondary hyperparathyroidism of renal origin: Secondary | ICD-10-CM | POA: Diagnosis not present

## 2021-12-30 DIAGNOSIS — Z992 Dependence on renal dialysis: Secondary | ICD-10-CM | POA: Diagnosis not present

## 2021-12-31 DIAGNOSIS — Z452 Encounter for adjustment and management of vascular access device: Secondary | ICD-10-CM | POA: Diagnosis not present

## 2021-12-31 DIAGNOSIS — N186 End stage renal disease: Secondary | ICD-10-CM | POA: Diagnosis not present

## 2021-12-31 DIAGNOSIS — Z992 Dependence on renal dialysis: Secondary | ICD-10-CM | POA: Diagnosis not present

## 2022-01-01 DIAGNOSIS — D509 Iron deficiency anemia, unspecified: Secondary | ICD-10-CM | POA: Diagnosis not present

## 2022-01-01 DIAGNOSIS — Z992 Dependence on renal dialysis: Secondary | ICD-10-CM | POA: Diagnosis not present

## 2022-01-01 DIAGNOSIS — D689 Coagulation defect, unspecified: Secondary | ICD-10-CM | POA: Diagnosis not present

## 2022-01-01 DIAGNOSIS — D631 Anemia in chronic kidney disease: Secondary | ICD-10-CM | POA: Diagnosis not present

## 2022-01-01 DIAGNOSIS — N2581 Secondary hyperparathyroidism of renal origin: Secondary | ICD-10-CM | POA: Diagnosis not present

## 2022-01-01 DIAGNOSIS — N186 End stage renal disease: Secondary | ICD-10-CM | POA: Diagnosis not present

## 2022-01-02 ENCOUNTER — Ambulatory Visit (INDEPENDENT_AMBULATORY_CARE_PROVIDER_SITE_OTHER): Payer: Medicare Other | Admitting: Neurology

## 2022-01-02 ENCOUNTER — Telehealth (HOSPITAL_COMMUNITY): Payer: Self-pay

## 2022-01-02 ENCOUNTER — Encounter: Payer: Self-pay | Admitting: Neurology

## 2022-01-02 VITALS — BP 94/60 | HR 75 | Ht 71.0 in | Wt 154.6 lb

## 2022-01-02 DIAGNOSIS — Z9981 Dependence on supplemental oxygen: Secondary | ICD-10-CM | POA: Diagnosis not present

## 2022-01-02 DIAGNOSIS — Z992 Dependence on renal dialysis: Secondary | ICD-10-CM | POA: Diagnosis not present

## 2022-01-02 DIAGNOSIS — R251 Tremor, unspecified: Secondary | ICD-10-CM

## 2022-01-02 DIAGNOSIS — I252 Old myocardial infarction: Secondary | ICD-10-CM

## 2022-01-02 DIAGNOSIS — N186 End stage renal disease: Secondary | ICD-10-CM

## 2022-01-02 DIAGNOSIS — G479 Sleep disorder, unspecified: Secondary | ICD-10-CM

## 2022-01-02 DIAGNOSIS — N179 Acute kidney failure, unspecified: Secondary | ICD-10-CM | POA: Diagnosis not present

## 2022-01-02 NOTE — Patient Instructions (Signed)
You have a rather mild tremor of both hands, but as I understand, it gets worse at night.   I do not see any signs or symptoms of parkinson's like disease or what we call parkinsonism.  For your tremor, I would not recommend any new medications at this time, as you are at high risk for side effects.   Please remember, that any kind of tremor may be exacerbated by anxiety, anger, nervousness, excitement, dehydration, sleep deprivation, thyroid dysfunction, by caffeine, and low blood sugar values or blood sugar fluctuations. Some medications can exacerbate tremors, this includes certain asthma or COPD medications and certain antidepressants, which includes sertraline and also your nausea medication, Reglan.   I agree with your primary care physician to treat you for anxiety and depression with low dose Sertraline.   Please try to hydrate better with water as you can have up to 2 liters of water per day.    Based on your symptoms and your exam I believe you are at risk for obstructive sleep apnea (aka OSA). We should proceed with a sleep study to determine whether you do or do not have OSA and how severe it is. Even, if you have mild OSA, I may want you to consider treatment with CPAP, as treatment of even borderline or mild sleep apnea can result and improvement of symptoms such as sleep disruption, daytime sleepiness, nighttime bathroom breaks, restless leg symptoms, improvement of headache syndromes, even improved mood disorder.   As explained, an attended sleep study (meaning you get to stay overnight in the sleep lab), lets [place] monitor sleep-related behaviors such as sleep talking and leg movements in sleep, in addition to monitoring for sleep apnea.  A home sleep test is a screening tool for sleep apnea diagnosis only, but unfortunately, does not help with any other sleep-related diagnoses.  Please remember, the long-term risks and ramifications of untreated moderate to severe obstructive sleep apnea  may include (but are not limited to): increased risk for cardiovascular disease, including congestive heart failure, stroke, difficult to control hypertension, treatment resistant obesity, arrhythmias, especially irregular heartbeat commonly known as A. Fib. (atrial fibrillation); even type 2 diabetes has been linked to untreated OSA.   Other correlations that untreated obstructive sleep apnea include macular edema which is swelling of the retina in the eyes, droopy eyelid syndrome, and elevated hemoglobin and hematocrit levels (often referred to as polycythemia).  Sleep apnea can cause disruption of sleep and sleep deprivation in most cases, which, in turn, can cause recurrent headaches, problems with memory, mood, concentration, focus, and vigilance. Most people with untreated sleep apnea report excessive daytime sleepiness, which can affect their ability to drive. Please do not drive or use heavy equipment or machinery, if you feel sleepy! Patients with sleep apnea can also develop difficulty initiating and maintaining sleep (aka insomnia).   Having sleep apnea may increase your risk for other sleep disorders, including involuntary behaviors sleep such as sleep terrors, sleep talking, sleepwalking.    Having sleep apnea can also increase your risk for restless leg syndrome and leg movements at night.   Please note that untreated obstructive sleep apnea may carry additional perioperative morbidity. Patients with significant obstructive sleep apnea (typically, in the moderate to severe degree) should receive, if possible, perioperative PAP (positive airway pressure) therapy and the surgeons and particularly the anesthesiologists should be informed of the diagnosis and the severity of the sleep disordered breathing.   We will call you or email you through [place] with  regards to your test results and plan a follow-up in sleep clinic accordingly. Most likely, you will hear from one of our nurses.   Our  sleep lab administrative assistant will call you to schedule your sleep study and give you further instructions, regarding the check in process for the sleep study, arrival time, what to bring, when you can expect to leave after the study, etc., and to answer any other logistical questions you may have. If you don't hear back from her by about 2 weeks from now, please feel free to call her direct line at [phone] or you can call our general clinic number, or email [place] through My Chart.

## 2022-01-02 NOTE — Telephone Encounter (Signed)
Oxygen order faxed to [facility] on 01/02/2022 at 11.00am

## 2022-01-02 NOTE — Progress Notes (Signed)
Subjective:    Patient ID: Ruben [name] is a 76 y.o. male.  HPI    [name], MD, [name] [facility] [address], [address] P.O. [place],  [address]  Dear Dr. Esmeralda Links,   I saw your patient, Ruben [name], upon your kind request in my neurologic clinic today for initial consultation of his tremors.  The patient is accompanied by his daughter, Ruben [name], today.  As you know, Ruben [name] is a 76 year old right-handed gentleman with an underlying complex medical history of end-stage renal disease, on hemodialysis, history of alcohol use disorder by chart review, hypertension, hyperlipidemia, coronary artery disease with history of MI, A-fib, CHF, chronic respiratory failure with hypoxia, arthritis, and prior smoking, who reports intermittent trembling affecting his hands and feet.  Symptoms started after his heart attack in December 2022.  He has had more stress he admits.  His primary care physician, Dr. Doristine Bosworth started him recently on sertraline 25 mg strength once daily.  He has been on it for about 2 months.  He has had several different medication trials.  Gabapentin 100 mg at bedtime did not help his tremors.  He does have trouble sleeping.  He has a prescription for Xanax and Valium but these are not currently active and were only prescribed for a procedure or as needed use before dialysis.  He does have a prescription for mirtazapine, 15 mg as well as 30 mg, he currently does not take any of these.  He has a prescription for Reglan which he uses infrequently.   He may go to bed around 10 PM but may not be asleep until the early morning hours.  Rise time varies. I reviewed your office note from 12/30/2021.  He has had blood work recently which I was able to review from 12/30/2021: Folate was over 20, ferritin elevated at 1275, A1c 5.8, iron saturation 28, TSH 4.31, within range, B12 1050.  He had labs on 11/28/2021: amylase mildly elevated at 188, CBC with  differential and platelets showed mildly below normal platelets at 144, hemoglobin below normal at 10.7, hematocrit below normal at 32.6, hepatic panel showed alk phos 86, ALT 24, AST 21.  Lipase elevated at 81, slightly above normal.  He can drink up to 2 L of water per day but does not drink that much.  He does not drink any caffeine, has stopped drinking alcohol in December 2022 and does admit to having history of alcohol abuse.  He quit smoking also in December 2022.  He lives with his wife.  He is the oldest of 10 children, none of his siblings has a tremor.  He does not recall his parents having a tremor.  None of his kids have a tremor, he has 3 boys and 1 daughter.  He does not sleep very well, he snores, he has never had a sleep study, he is on supplemental oxygen at night at 2 L/min per cardiology.  He has been on hemodialysis since February or March 2023.  He does not have any nocturia, does not actually have much in the way of urine production any longer.  His Past Medical History Is Significant For: Past Medical History:  Diagnosis Date   Arthritis    Chronic kidney disease, stage 3b ([place])    ESRD (end stage renal disease) ([place])    ETOH abuse    History of stress test 09/02/2008   showed inferolateral scar without ischemia   Hx of echocardiogram 09/02/2008  was essentially normal   Hyperlipidemia    Hypertension    Myocardial infarction [facility])    Tobacco abuse     His Past Surgical History Is Significant For: Past Surgical History:  Procedure Laterality Date   AV FISTULA PLACEMENT Left 05/27/2021   Procedure: LEFT ARTERIOVENOUS (AV) GRAFT CREATION;  Surgeon: Broadus John, MD;  Location: [place];  Service: Vascular;  Laterality: Left;  PERIPHERAL NERVE BLOCK   BACK SURGERY     Dr Luiz Ochoa involving L3-L4 discectomy.   BIOPSY  06/14/2021   Procedure: BIOPSY;  Surgeon: Carol Ada, MD;  Location: [facility] ENDOSCOPY;  Service: Endoscopy;;   CARDIOVERSION N/A 05/13/2021   Procedure:  CARDIOVERSION;  Surgeon: Larey Dresser, MD;  Location: [facility] ENDOSCOPY;  Service: Cardiovascular;  Laterality: N/A;   FLEXIBLE SIGMOIDOSCOPY N/A 06/14/2021   Procedure: Beryle Quant;  Surgeon: Carol Ada, MD;  Location: [place];  Service: Endoscopy;  Laterality: N/A;   HERNIA REPAIR     IR FLUORO GUIDE CV LINE RIGHT  05/21/2021   IR US GUIDE VASC ACCESS RIGHT  05/21/2021   RIGHT/LEFT HEART CATH AND CORONARY ANGIOGRAPHY N/A 05/09/2021   Procedure: RIGHT/LEFT HEART CATH AND CORONARY ANGIOGRAPHY;  Surgeon: Larey Dresser, MD;  Location: [place] CV LAB;  Service: Cardiovascular;  Laterality: N/A;   SPINE SURGERY     TEE WITHOUT CARDIOVERSION N/A 05/07/2021   Procedure: TRANSESOPHAGEAL ECHOCARDIOGRAM (TEE);  Surgeon: Larey Dresser, MD;  Location: [facility] ENDOSCOPY;  Service: Cardiovascular;  Laterality: N/A;   TEE WITHOUT CARDIOVERSION N/A 05/13/2021   Procedure: TRANSESOPHAGEAL ECHOCARDIOGRAM (TEE);  Surgeon: Larey Dresser, MD;  Location: [facility] ENDOSCOPY;  Service: Cardiovascular;  Laterality: N/A;   TEE WITHOUT CARDIOVERSION N/A 10/31/2021   Procedure: TRANSESOPHAGEAL ECHOCARDIOGRAM (TEE);  Surgeon: Larey Dresser, MD;  Location: [facility] ENDOSCOPY;  Service: Cardiovascular;  Laterality: N/A;    His Family History Is Significant For: Family History  Problem Relation Age of Onset   Heart attack Brother    Diabetes Brother    Heart disease Brother    Cancer Father    Diabetes Brother    Heart disease Brother     His Social History Is Significant For: Social History   Socioeconomic History   Marital status: Single    Spouse name: Not on file   Number of children: Not on file   Years of education: Not on file   Highest education level: Not on file  Occupational History   Not on file  Tobacco Use   Smoking status: Former    Packs/day: 1.50    Years: 20.00    Total pack years: 30.00    Types: Cigarettes    Quit date: 04/28/2021    Years since quitting: 0.6   Smokeless  tobacco: Never   Tobacco comments:    down to 2 packs per week; 3/21 - down to 1 Pack per week  Vaping Use   Vaping Use: Never used  Substance and Sexual Activity   Alcohol use: No    Alcohol/week: 0.0 standard drinks of alcohol   Drug use: No   Sexual activity: Not on file  Other Topics Concern   Not on file  Social History Narrative   Caffiene- rare.    Education: HS   Retired: Administrator.    Social Determinants of Health   Financial Resource Strain: Medium Risk (05/02/2021)   Overall Financial Resource Strain (CARDIA)    Difficulty of Paying Living Expenses: Somewhat hard  Food Insecurity: No Food  Insecurity (05/02/2021)   Hunger Vital Sign    Worried About Running Out of Food in the Last Year: Never true    [place] in the Last Year: Never true  Transportation Needs: No Transportation Needs (05/02/2021)   PRAPARE - Hydrologist (Medical): No    Lack of Transportation (Non-Medical): No  Physical Activity: Not on file  Stress: Not on file  Social Connections: Not on file    His Allergies Are:  Allergies  Allergen Reactions   Other Other (See Comments)    Pollen - sneezing, itching/watery eyes  :   His Current Medications Are:  Outpatient Encounter Medications as of 01/02/2022  Medication Sig   acetaminophen (TYLENOL) 650 MG CR tablet Take 650 mg by mouth every 8 (eight) hours as needed for pain.   albuterol (PROVENTIL) (2.5 MG/3ML) 0.083% nebulizer solution Take 3 mLs (2.5 mg total) by nebulization every 4 (four) hours as needed for wheezing or shortness of breath.   ALPRAZolam (XANAX) 0.25 MG tablet Take 1 tablet (0.25 mg total) by mouth daily as needed for anxiety (anxiety with dialysis).   amiodarone (PACERONE) 200 MG tablet Take 1 tablet (200 mg total) by mouth daily.   apixaban (ELIQUIS) 5 MG TABS tablet Take 1 tablet (5 mg total) by mouth 2 (two) times daily.   atorvastatin (LIPITOR) 80 MG tablet Take 1 tablet (80 mg total)  by mouth daily.   cetirizine (ZYRTEC) 10 MG tablet Take 10 mg by mouth daily as needed for allergies.   cholecalciferol (VITAMIN D3) 25 MCG (1000 UNIT) tablet Take 1,000 Units by mouth daily.   diazepam (VALIUM) 5 MG tablet Take 5 mg by mouth as needed.   diltiazem 2 % GEL Apply 1 Application topically 2 (two) times daily.   gabapentin (NEURONTIN) 100 MG capsule 1 capsule   hydrocortisone (ANUSOL-HC) 25 MG suppository Place 1 suppository (25 mg total) rectally 2 (two) times daily.   hydrOXYzine (ATARAX) 10 MG tablet Take 10 mg by mouth at bedtime.   lidocaine-prilocaine (EMLA) cream Apply 1 Application topically as needed.   magnesium oxide (MAG-OX) 400 (240 Mg) MG tablet Take 400 mg by mouth daily.   metoCLOPramide (REGLAN) 5 MG tablet Take 1 tablet (5 mg total) by mouth every 8 (eight) hours as needed for nausea.   midodrine (PROAMATINE) 10 MG tablet Take 1.5 tablets (15 mg total) by mouth 3 (three) times daily with meals.   nitroGLYCERIN (NITROSTAT) 0.4 MG SL tablet Place 1 tablet (0.4 mg total) under the tongue every 5 (five) minutes as needed for chest pain.   omeprazole (PRILOSEC) 40 MG capsule 1 capsule 30 minutes before morning meal   ondansetron (ZOFRAN) 4 MG tablet Take 1 tablet (4 mg total) by mouth daily as needed for nausea or vomiting.   phenylephrine (,USE FOR PREPARATION-H,) 0.25 % suppository Place 1 suppository rectally as needed for hemorrhoids.   polyethylene glycol powder (MIRALAX) 17 GM/SCOOP powder See admin instructions.   prochlorperazine (COMPAZINE) 5 MG tablet Take 5 mg by mouth 3 (three) times daily as needed.   Propylene Glycol (SYSTANE COMPLETE) 0.6 % SOLN Place 1 drop into both eyes as needed (dry eyes).   sertraline (ZOLOFT) 25 MG tablet Take 25 mg by mouth daily.   [DISCONTINUED] Multiple Vitamins-Minerals (MULTIVITAMIN WITH MINERALS) tablet Take 1 tablet by mouth daily.   mirtazapine (REMERON) 30 MG tablet Take 30 mg by mouth at bedtime. (Patient not taking:  Reported on 01/02/2022)   [  DISCONTINUED] mirtazapine (REMERON) 15 MG tablet Take 15 mg by mouth at bedtime. (Patient not taking: Reported on 01/02/2022)   No facility-administered encounter medications on file as of 01/02/2022.  :   Review of Systems:  Out of a complete 14 point review of systems, all are reviewed and negative with the exception of these symptoms as listed below:  Review of Systems  Neurological:        Having issues with tremors, (legs and hands) since approx end December after heart attack.  He has dialysis MWF [facility].      Objective:  Neurological Exam  Physical Exam Physical Examination:   Vitals:   01/02/22 1244  BP: 94/60  Pulse: 75    General Examination: The patient is a very pleasant 76 y.o. male in no acute distress. He appears well-developed and well-nourished and well groomed.   HEENT: Normocephalic, atraumatic, pupils are equal, round and reactive to light, extraocular tracking is good without limitation to gaze excursion or nystagmus noted. Hearing is grossly intact. Face is symmetric with normal facial animation. Speech is clear with no dysarthria noted. There is no hypophonia. There is no lip, neck/head, jaw or voice tremor. Neck is supple with full range of passive and active motion. There are no carotid bruits on auscultation. Oropharynx exam reveals: , adequate dental hygiene and moderate airway crowding, due to redundant soft palate and larger uvula, tonsils either absent or on the small side, left side some tonsillar tissue visible.  Mallampati class II.   Chest: Clear to auscultation without wheezing, rhonchi or crackles noted.  Heart: S1+S2+0, regular and normal without murmurs, rubs or gallops noted.   Abdomen: Soft, non-tender and non-distended.  Extremities: There is no pitting edema in the distal lower extremities bilaterally.  Left arm AV fistula.  Skin: Warm and dry without trophic changes noted.   Musculoskeletal: exam reveals no  obvious joint deformities.   Neurologically:  Mental status: The patient is awake, alert and oriented in all 4 spheres. His immediate and remote memory, attention, language skills and fund of knowledge are appropriate. There is no evidence of aphasia, agnosia, apraxia or anomia. Speech is clear with normal prosody and enunciation. Thought process is linear. Mood is normal and affect is normal.  Cranial nerves II - XII are as described above under HEENT exam.  Motor exam: Normal bulk, strength and tone is noted. There is no obvious resting tremor.  He has a very mild right upper extremity and slight left upper extremity postural tremor, no action tremor, no intention tremor. On 01/02/2022: On Archimedes spiral drawing there is no significant trembling with either hand, handwriting is legible, slightly tremulous, not micrographic. Reflexes are 1+ in the upper extremities, absent in the lower extremities.  Fine motor skills and coordination: Mild global impairment of finger taps and hand movements and foot taps, no lateralization, no decrement in amplitude.  Cerebellar testing: No dysmetria or intention tremor. There is no truncal or gait ataxia.  Fairly good finger to nose and heel-to-shin bilaterally. Sensory exam: intact to light touch in the upper and lower extremities.  Gait, station and balance: He stands without difficulty.  He brought a single-point cane.  He is able to walk a little bit without the cane, posture is age-appropriate, no shuffling, has preserved arm swing.   Assessment and Plan:  In summary, Ruben [name] is a very pleasant 76 y.o.-year old male with an underlying complex medical history of end-stage renal disease, on hemodialysis, history of alcohol  use disorder by chart review, hypertension, hyperlipidemia, coronary artery disease with history of MI, A-fib, CHF, chronic respiratory failure with hypoxia, arthritis, and prior smoking, who presents for evaluation of his tremor  disorder of several months' duration.  History and physical exam are not in keeping with essential tremor, there is no evidence of parkinsonism today.  He is largely reassured in that regard.  He has a slight postural tremor in both upper extremities and reports that it is typically worse at night.  He does admit to stress.  Tremor triggers in his case would include sleep deprivation, poor quality sleep, dehydration, stress, certain medications.  I talked to the patient and his daughter at length today.  We mutually agreed not to try him on any additional medication for tremor control.  He would be at high risk for side effects.  He has had low blood pressure and a beta-blocker would not be a good choice for him.  He has tried multiple medications to help him sleep at night.  Of note, he currently takes melatonin.  He has never had a sleep study and may be at risk for underlying obstructive sleep apnea.  I talked to the patient and his daughter about this condition as well and treatment options, showed him a model for an AutoPap machine.  Of note, his daughter is familiar with the diagnosis as she has a CPAP machine.  He is willing to pursue sleep testing.  I suggested that we proceed with sleep study evaluation.  He is encouraged to stay better hydrated with water and he is allowed up to 2 L of water per day per daughter's report.  We will reconvene after sleep testing.  For now, he is advised that we do not necessarily need to proceed with any other testing, we can certainly consider a brain MRI without contrast but his history and findings do not suggest any underlying structural cause of his symptoms.  I answered all their questions today and the patient and his daughter were in agreement with our plan.    Thank you very much for allowing me to participate in the care of this nice patient. If I can be of any further assistance to you please do not hesitate to call me at [phone].  Sincerely,   [name], MD, [name]

## 2022-01-03 ENCOUNTER — Telehealth (HOSPITAL_COMMUNITY): Payer: Self-pay | Admitting: *Deleted

## 2022-01-03 DIAGNOSIS — Z992 Dependence on renal dialysis: Secondary | ICD-10-CM | POA: Diagnosis not present

## 2022-01-03 DIAGNOSIS — N2581 Secondary hyperparathyroidism of renal origin: Secondary | ICD-10-CM | POA: Diagnosis not present

## 2022-01-03 DIAGNOSIS — D509 Iron deficiency anemia, unspecified: Secondary | ICD-10-CM | POA: Diagnosis not present

## 2022-01-03 DIAGNOSIS — N186 End stage renal disease: Secondary | ICD-10-CM | POA: Diagnosis not present

## 2022-01-03 NOTE — Telephone Encounter (Signed)
-----   Message from Larey Dresser, MD sent at 01/02/2022  4:16 PM EDT ----- Regarding: RE: Ok to initiate oxygen if needed during Cardiac Rehab Yes ok. Keep sat > 90% ----- Message ----- From: Rowe Pavy, RN Sent: 01/02/2022   3:03 PM EDT To: Larey Dresser, MD Subject: Ok to initiate oxygen if needed during Cardi#   Dr. Aundra Dubin,  Able to get this pt scheduled for cardiac rehab s/p late presenting MI in December.  Per his daughter, Carmelia Bake, he wears 2 LNC of oxygen at night.  Unsure of his oxygen demand needs with activity. Maintains per daughter 95% on room air.   Ok to apply oxygen and titrate if needed to maintain prescribed O2 saturation?   If so, what is an acceptable O2 saturation for this pt.  Thanks for your input Maurice Small RN, BSN Cardiac and Pulmonary Rehab Nurse Navigator

## 2022-01-05 DIAGNOSIS — M545 Low back pain, unspecified: Secondary | ICD-10-CM | POA: Diagnosis not present

## 2022-01-06 DIAGNOSIS — D509 Iron deficiency anemia, unspecified: Secondary | ICD-10-CM | POA: Diagnosis not present

## 2022-01-06 DIAGNOSIS — N2581 Secondary hyperparathyroidism of renal origin: Secondary | ICD-10-CM | POA: Diagnosis not present

## 2022-01-06 DIAGNOSIS — N186 End stage renal disease: Secondary | ICD-10-CM | POA: Diagnosis not present

## 2022-01-06 DIAGNOSIS — Z992 Dependence on renal dialysis: Secondary | ICD-10-CM | POA: Diagnosis not present

## 2022-01-07 DIAGNOSIS — I2510 Atherosclerotic heart disease of native coronary artery without angina pectoris: Secondary | ICD-10-CM | POA: Diagnosis not present

## 2022-01-07 DIAGNOSIS — I480 Paroxysmal atrial fibrillation: Secondary | ICD-10-CM | POA: Diagnosis not present

## 2022-01-08 DIAGNOSIS — D509 Iron deficiency anemia, unspecified: Secondary | ICD-10-CM | POA: Diagnosis not present

## 2022-01-08 DIAGNOSIS — Z992 Dependence on renal dialysis: Secondary | ICD-10-CM | POA: Diagnosis not present

## 2022-01-08 DIAGNOSIS — N2581 Secondary hyperparathyroidism of renal origin: Secondary | ICD-10-CM | POA: Diagnosis not present

## 2022-01-08 DIAGNOSIS — N186 End stage renal disease: Secondary | ICD-10-CM | POA: Diagnosis not present

## 2022-01-10 DIAGNOSIS — N186 End stage renal disease: Secondary | ICD-10-CM | POA: Diagnosis not present

## 2022-01-10 DIAGNOSIS — D509 Iron deficiency anemia, unspecified: Secondary | ICD-10-CM | POA: Diagnosis not present

## 2022-01-10 DIAGNOSIS — N2581 Secondary hyperparathyroidism of renal origin: Secondary | ICD-10-CM | POA: Diagnosis not present

## 2022-01-10 DIAGNOSIS — J029 Acute pharyngitis, unspecified: Secondary | ICD-10-CM | POA: Diagnosis not present

## 2022-01-10 DIAGNOSIS — Z992 Dependence on renal dialysis: Secondary | ICD-10-CM | POA: Diagnosis not present

## 2022-01-10 DIAGNOSIS — Z20822 Contact with and (suspected) exposure to covid-19: Secondary | ICD-10-CM | POA: Diagnosis not present

## 2022-01-13 ENCOUNTER — Telehealth (HOSPITAL_COMMUNITY): Payer: Self-pay | Admitting: *Deleted

## 2022-01-13 ENCOUNTER — Telehealth (HOSPITAL_COMMUNITY): Payer: Self-pay

## 2022-01-13 DIAGNOSIS — N2581 Secondary hyperparathyroidism of renal origin: Secondary | ICD-10-CM | POA: Diagnosis not present

## 2022-01-13 DIAGNOSIS — N186 End stage renal disease: Secondary | ICD-10-CM | POA: Diagnosis not present

## 2022-01-13 DIAGNOSIS — Z992 Dependence on renal dialysis: Secondary | ICD-10-CM | POA: Diagnosis not present

## 2022-01-13 DIAGNOSIS — D509 Iron deficiency anemia, unspecified: Secondary | ICD-10-CM | POA: Diagnosis not present

## 2022-01-13 NOTE — Telephone Encounter (Signed)
Pt. Daughter called with concerns about father's condition. She informed me that he had lab work done at his primary care last Friday 09/08. Asked her to call them and have tem fax the results over to the office, so that Dr. Aundra Dubin can review them and either make medication adjustments of advise on next step of care. Informed daughter I would return a call to her once the lab work was reviewed by Dr. Aundra Dubin and recommendations given

## 2022-01-14 ENCOUNTER — Telehealth (HOSPITAL_COMMUNITY): Payer: Self-pay

## 2022-01-14 ENCOUNTER — Encounter (HOSPITAL_COMMUNITY)
Admission: RE | Admit: 2022-01-14 | Discharge: 2022-01-14 | Disposition: A | Discharge status: Home / Self Care | Payer: Medicare Other | Source: Ambulatory Visit | Attending: Cardiovascular Disease | Admitting: Cardiovascular Disease

## 2022-01-14 ENCOUNTER — Telehealth (HOSPITAL_COMMUNITY): Payer: Self-pay | Admitting: *Deleted

## 2022-01-14 DIAGNOSIS — I5022 Chronic systolic (congestive) heart failure: Secondary | ICD-10-CM | POA: Diagnosis not present

## 2022-01-14 DIAGNOSIS — R7303 Prediabetes: Secondary | ICD-10-CM | POA: Insufficient documentation

## 2022-01-14 LAB — GLUCOSE, CAPILLARY: Glucose-Capillary: 129 mg/dL — ABNORMAL HIGH (ref 70–99)

## 2022-01-14 NOTE — Telephone Encounter (Signed)
Followed up with daughter to check on Mr. Liford. Received lab results from PCP office and they were reviewed by Dr. Aundra Dubin. Results didn't give the information Dr. Aundra Dubin was looking for. Told daughter that he needs a Complete Blood Count and a Complete Chemistry panel, so that we could see if he had an infection or if medications needed to be adjusted. She is going to get labs done tomorrow. Also advised her if she saw a further detortion in her father to call 911 and get him to the emergency room for a evaluation

## 2022-01-14 NOTE — Progress Notes (Addendum)
Incomplete Session Note  Patient Details  Name: Ruben [name] MRN: [id number] Date of Birth: [birth year] Referring Provider:    Herbie Baltimore Junior Minerva Fester did not complete his rehab session.  Mr Bowland says he is feeling exhausted for the past 6-8 weeks. Denies chest pain or shortness of breath today.  Patient is here with his wife and daughter. Will not proceed with orientation and cardiac rehab as there is also a conflict with his exercise time and Hemodialysis. Vital signs are as follows. Blood pressure 114/62. Oxygen saturation 96% on room air. CBG 129 heart rate 74. Patient placed on monitor.  Sinus Rhythm with a first degree heart block. The heart failure clinic was called and notified and forwarded today's vital signs..The heart failure clinic will contact the patient and his family. if further instructions are necessary. Patient and family left cardiac rehab without complaints. Will cancel current exercise appointments.Barnet Pall, RN,BSN 01/14/2022 12:10 PM

## 2022-01-14 NOTE — Telephone Encounter (Signed)
Lab order faxed to dialysis as requested.   Fax # [phone]

## 2022-01-15 DIAGNOSIS — Z992 Dependence on renal dialysis: Secondary | ICD-10-CM | POA: Diagnosis not present

## 2022-01-15 DIAGNOSIS — N2581 Secondary hyperparathyroidism of renal origin: Secondary | ICD-10-CM | POA: Diagnosis not present

## 2022-01-15 DIAGNOSIS — N186 End stage renal disease: Secondary | ICD-10-CM | POA: Diagnosis not present

## 2022-01-15 DIAGNOSIS — D509 Iron deficiency anemia, unspecified: Secondary | ICD-10-CM | POA: Diagnosis not present

## 2022-01-16 DIAGNOSIS — Z0000 Encounter for general adult medical examination without abnormal findings: Secondary | ICD-10-CM | POA: Diagnosis not present

## 2022-01-16 DIAGNOSIS — I5022 Chronic systolic (congestive) heart failure: Secondary | ICD-10-CM | POA: Diagnosis not present

## 2022-01-16 DIAGNOSIS — K219 Gastro-esophageal reflux disease without esophagitis: Secondary | ICD-10-CM | POA: Diagnosis not present

## 2022-01-16 DIAGNOSIS — N186 End stage renal disease: Secondary | ICD-10-CM | POA: Diagnosis not present

## 2022-01-16 DIAGNOSIS — E7800 Pure hypercholesterolemia, unspecified: Secondary | ICD-10-CM | POA: Diagnosis not present

## 2022-01-16 DIAGNOSIS — Z23 Encounter for immunization: Secondary | ICD-10-CM | POA: Diagnosis not present

## 2022-01-16 DIAGNOSIS — I2510 Atherosclerotic heart disease of native coronary artery without angina pectoris: Secondary | ICD-10-CM | POA: Diagnosis not present

## 2022-01-16 DIAGNOSIS — Z992 Dependence on renal dialysis: Secondary | ICD-10-CM | POA: Diagnosis not present

## 2022-01-16 DIAGNOSIS — I480 Paroxysmal atrial fibrillation: Secondary | ICD-10-CM | POA: Diagnosis not present

## 2022-01-16 DIAGNOSIS — J9611 Chronic respiratory failure with hypoxia: Secondary | ICD-10-CM | POA: Diagnosis not present

## 2022-01-17 ENCOUNTER — Other Ambulatory Visit: Payer: Self-pay | Admitting: Internal Medicine

## 2022-01-17 DIAGNOSIS — N2581 Secondary hyperparathyroidism of renal origin: Secondary | ICD-10-CM | POA: Diagnosis not present

## 2022-01-17 DIAGNOSIS — Z992 Dependence on renal dialysis: Secondary | ICD-10-CM | POA: Diagnosis not present

## 2022-01-17 DIAGNOSIS — N186 End stage renal disease: Secondary | ICD-10-CM | POA: Diagnosis not present

## 2022-01-17 DIAGNOSIS — D509 Iron deficiency anemia, unspecified: Secondary | ICD-10-CM | POA: Diagnosis not present

## 2022-01-17 DIAGNOSIS — N289 Disorder of kidney and ureter, unspecified: Secondary | ICD-10-CM

## 2022-01-20 ENCOUNTER — Other Ambulatory Visit (HOSPITAL_COMMUNITY): Payer: Self-pay | Admitting: Cardiology

## 2022-01-20 ENCOUNTER — Ambulatory Visit (HOSPITAL_COMMUNITY): Payer: Medicare Other

## 2022-01-20 DIAGNOSIS — Z992 Dependence on renal dialysis: Secondary | ICD-10-CM | POA: Diagnosis not present

## 2022-01-20 DIAGNOSIS — N186 End stage renal disease: Secondary | ICD-10-CM | POA: Diagnosis not present

## 2022-01-20 DIAGNOSIS — D509 Iron deficiency anemia, unspecified: Secondary | ICD-10-CM | POA: Diagnosis not present

## 2022-01-20 DIAGNOSIS — N2581 Secondary hyperparathyroidism of renal origin: Secondary | ICD-10-CM | POA: Diagnosis not present

## 2022-01-21 ENCOUNTER — Telehealth: Payer: Self-pay | Admitting: Neurology

## 2022-01-21 NOTE — Telephone Encounter (Signed)
NPSG-no auth req'd-DTR, Reba, to stay and help with patient..  Patient is scheduled at [facility] for 02/19/22 at 9 pm.  Mailed packet to the patient daughter per daughter request.

## 2022-01-22 ENCOUNTER — Ambulatory Visit (HOSPITAL_COMMUNITY): Payer: Medicare Other

## 2022-01-22 ENCOUNTER — Other Ambulatory Visit: Payer: Self-pay | Admitting: Internal Medicine

## 2022-01-22 ENCOUNTER — Ambulatory Visit
Admission: RE | Admit: 2022-01-22 | Discharge: 2022-01-22 | Disposition: A | Discharge status: Home / Self Care | Payer: Medicare Other | Source: Ambulatory Visit | Attending: Internal Medicine | Admitting: Internal Medicine

## 2022-01-22 DIAGNOSIS — M47816 Spondylosis without myelopathy or radiculopathy, lumbar region: Secondary | ICD-10-CM | POA: Diagnosis not present

## 2022-01-22 DIAGNOSIS — D509 Iron deficiency anemia, unspecified: Secondary | ICD-10-CM | POA: Diagnosis not present

## 2022-01-22 DIAGNOSIS — M546 Pain in thoracic spine: Secondary | ICD-10-CM

## 2022-01-22 DIAGNOSIS — R531 Weakness: Secondary | ICD-10-CM | POA: Diagnosis not present

## 2022-01-22 DIAGNOSIS — M2578 Osteophyte, vertebrae: Secondary | ICD-10-CM | POA: Diagnosis not present

## 2022-01-22 DIAGNOSIS — Z992 Dependence on renal dialysis: Secondary | ICD-10-CM | POA: Diagnosis not present

## 2022-01-22 DIAGNOSIS — N2581 Secondary hyperparathyroidism of renal origin: Secondary | ICD-10-CM | POA: Diagnosis not present

## 2022-01-22 DIAGNOSIS — N186 End stage renal disease: Secondary | ICD-10-CM | POA: Diagnosis not present

## 2022-01-24 ENCOUNTER — Ambulatory Visit (HOSPITAL_COMMUNITY): Payer: Medicare Other

## 2022-01-24 DIAGNOSIS — N186 End stage renal disease: Secondary | ICD-10-CM | POA: Diagnosis not present

## 2022-01-24 DIAGNOSIS — Z992 Dependence on renal dialysis: Secondary | ICD-10-CM | POA: Diagnosis not present

## 2022-01-24 DIAGNOSIS — D509 Iron deficiency anemia, unspecified: Secondary | ICD-10-CM | POA: Diagnosis not present

## 2022-01-24 DIAGNOSIS — N2581 Secondary hyperparathyroidism of renal origin: Secondary | ICD-10-CM | POA: Diagnosis not present

## 2022-01-27 ENCOUNTER — Other Ambulatory Visit: Payer: Self-pay | Admitting: Internal Medicine

## 2022-01-27 ENCOUNTER — Ambulatory Visit (HOSPITAL_COMMUNITY): Payer: Medicare Other

## 2022-01-27 ENCOUNTER — Encounter (HOSPITAL_COMMUNITY): Payer: Medicare Other | Admitting: Cardiology

## 2022-01-27 DIAGNOSIS — N186 End stage renal disease: Secondary | ICD-10-CM | POA: Diagnosis not present

## 2022-01-27 DIAGNOSIS — N2581 Secondary hyperparathyroidism of renal origin: Secondary | ICD-10-CM | POA: Diagnosis not present

## 2022-01-27 DIAGNOSIS — I7121 Aneurysm of the ascending aorta, without rupture: Secondary | ICD-10-CM

## 2022-01-27 DIAGNOSIS — Z992 Dependence on renal dialysis: Secondary | ICD-10-CM | POA: Diagnosis not present

## 2022-01-27 DIAGNOSIS — D509 Iron deficiency anemia, unspecified: Secondary | ICD-10-CM | POA: Diagnosis not present

## 2022-01-28 ENCOUNTER — Ambulatory Visit (HOSPITAL_COMMUNITY)
Admission: RE | Admit: 2022-01-28 | Discharge: 2022-01-28 | Disposition: A | Discharge status: Home / Self Care | Payer: Medicare Other | Source: Ambulatory Visit | Attending: Cardiology | Admitting: Cardiology

## 2022-01-28 VITALS — BP 100/60 | HR 76 | Wt 155.4 lb

## 2022-01-28 DIAGNOSIS — F1010 Alcohol abuse, uncomplicated: Secondary | ICD-10-CM | POA: Insufficient documentation

## 2022-01-28 DIAGNOSIS — I255 Ischemic cardiomyopathy: Secondary | ICD-10-CM | POA: Diagnosis not present

## 2022-01-28 DIAGNOSIS — N179 Acute kidney failure, unspecified: Secondary | ICD-10-CM | POA: Insufficient documentation

## 2022-01-28 DIAGNOSIS — E785 Hyperlipidemia, unspecified: Secondary | ICD-10-CM | POA: Insufficient documentation

## 2022-01-28 DIAGNOSIS — I5022 Chronic systolic (congestive) heart failure: Secondary | ICD-10-CM | POA: Diagnosis not present

## 2022-01-28 DIAGNOSIS — I132 Hypertensive heart and chronic kidney disease with heart failure and with stage 5 chronic kidney disease, or end stage renal disease: Secondary | ICD-10-CM | POA: Diagnosis not present

## 2022-01-28 DIAGNOSIS — I953 Hypotension of hemodialysis: Secondary | ICD-10-CM | POA: Diagnosis not present

## 2022-01-28 DIAGNOSIS — I2510 Atherosclerotic heart disease of native coronary artery without angina pectoris: Secondary | ICD-10-CM | POA: Insufficient documentation

## 2022-01-28 DIAGNOSIS — Z7901 Long term (current) use of anticoagulants: Secondary | ICD-10-CM | POA: Diagnosis not present

## 2022-01-28 DIAGNOSIS — Z79899 Other long term (current) drug therapy: Secondary | ICD-10-CM | POA: Diagnosis not present

## 2022-01-28 DIAGNOSIS — N186 End stage renal disease: Secondary | ICD-10-CM | POA: Insufficient documentation

## 2022-01-28 DIAGNOSIS — I252 Old myocardial infarction: Secondary | ICD-10-CM | POA: Insufficient documentation

## 2022-01-28 DIAGNOSIS — F32A Depression, unspecified: Secondary | ICD-10-CM | POA: Insufficient documentation

## 2022-01-28 DIAGNOSIS — E7800 Pure hypercholesterolemia, unspecified: Secondary | ICD-10-CM | POA: Diagnosis not present

## 2022-01-28 DIAGNOSIS — I480 Paroxysmal atrial fibrillation: Secondary | ICD-10-CM | POA: Diagnosis not present

## 2022-01-28 DIAGNOSIS — Z87891 Personal history of nicotine dependence: Secondary | ICD-10-CM | POA: Diagnosis not present

## 2022-01-28 LAB — LIPID PANEL
Cholesterol: 119 mg/dL (ref 0–200)
HDL: 36 mg/dL — ABNORMAL LOW (ref >40)
LDL Cholesterol: 70 mg/dL (ref 0–99)
Total CHOL/HDL Ratio: 3.3 RATIO
Triglycerides: 65 mg/dL (ref <150)
VLDL: 13 mg/dL (ref 0–40)

## 2022-01-28 LAB — TSH: TSH: 6.248 u[IU]/mL — ABNORMAL HIGH (ref 0.350–4.500)

## 2022-01-28 NOTE — Patient Instructions (Signed)
There has been no changes to your medications.  Labs done today, your results will be available in MyChart, we will contact you for abnormal readings.  You have been referred back to cardiac rehab.  Your physician recommends that you schedule a follow-up appointment in: 4 months ( January 2024 ) ** please call the office in November to arrange your follow up appointment **  If you have any questions or concerns before your next appointment please send [place] a message through [place] or call our office at [phone].    TO LEAVE A MESSAGE FOR THE NURSE SELECT OPTION 2, PLEASE LEAVE A MESSAGE INCLUDING: YOUR NAME DATE OF BIRTH CALL BACK NUMBER REASON FOR CALL**this is important as we prioritize the call backs  YOU WILL RECEIVE A CALL BACK THE SAME DAY AS LONG AS YOU CALL BEFORE 4:00 PM  At the [facility], you and your health needs are our priority. As part of our continuing mission to provide you with exceptional heart care, we have created designated Provider Care Teams. These Care Teams include your primary Cardiologist (physician) and Advanced Practice Providers (APPs- Physician Assistants and Nurse Practitioners) who all work together to provide you with the care you need, when you need it.   You may see any of the following providers on your designated Care Team at your next follow up: Dr Glori Bickers Dr Loralie Champagne Dr. Roxana Hires, NP Lyda Jester, [place] [facility], [place] Forestine Na, NP Audry Riles, PharmD   Please be sure to bring in all your medications bottles to every appointment.

## 2022-01-28 NOTE — Progress Notes (Signed)
ADVANCED HF CLINIC CONSULT NOTE  Primary Care: Ruben Jewel, MD HF Cardiologist: Dr. Aundra [name]  HPI: Ruben [name] 76 y.o. male with history of HTN, HLD, ESRD, tobacco and alcohol abuse (pint a [name] of wine formerly), depression.   Admitted 04/29/21 with suspected late presentation MI. Went into AF with RVR and placed on BiPAP. He was initiated on IV amio, heparin gtt, nitro gtt and IV diuretics. Echo with LVEF 35-40%, inferior hypokinesis, RV okay, splay artifact with likely moderate to severe MR. Subsequently developed cardiogenic shock. Initial Co-ox low at 47% and started on NE + milrinone. Advanced HF team consulted. TEE showed EF 35-40%, RV okay, moderate AI, moderate MR (suspect infarct related). R/LHC (initially delayed d/t AKI on CKD): Occluded RCA (culprit), diffuse disease in LAD and Lcx. On 0.25 mil RA mean 16, PA 66/26 (41), PCWP 27, PA 62%, Fick CO 5.04/ CI 2.56.    Seen by CVTS. Not felt to be a surgical candidate. AUnderwent TEE/DCCV --> SR. Repeat echo EF 35-40%, RV okay, moderate AI, trivial TR, moderate, likely infarct-related, MR. IV amio weaned off and transitioned to amio 200 mg daily. After procedures completed he was started on Eliquis. Course c/b AKI on CKD, felt to be d/t worsening ATN in setting of shock/episodes of hypotension versus CIN. Placed on CVVHD and later switched to iHD. Discharged home, weight 165 lbs.  Admitted 2/23 with rectal pain and dysphagia. GI consulted and patient manually disimpacted. Had MRI liver due to elevated LFTs which showed 12 mm benign lesions.   Echo 06/23: EF 40-45%, moderate to severe ischemic MR  TEE 06/23: EF 25-30%, RV mildly reduced, severe ischemic MR  He was referred to structural heart team for consideration of MitraClip. His TEE imaging was very difficult.  Given his poor functional capacity and challenging imaging, Dr. Burt [name] decided that he would be a poor Mitraclip candidate.   Patient returns for followup of CHF.  He  has felt weak in general.  He tripped and fell recently, bruising his shoulder.  He sleeps during the [name] a lot.  He is tolerating HD.  He was able to walk into the office today with his cane, no exertional dyspnea.  He gets short of breath with inclines/stairs.  Weight is stable. No lightheadedness. No chest pain.   ECG (personally reviewed): NSR, nonspecific T wave flattening.   PMH: 1. ESRD 2. H/o ETOH abuse: Quit 12/22.  3. Atrial fibrillation: Paroxysmal, s/p DCCV in 1/23.  4. Mitral regurgitation: Suspect infarct-related.  - TEE (1/23): EF 35-40%, basal to mid inferior and inferolateral AK, RV normal, moderate AI, suspect moderate infarct-related MR.  - TEE (6/23): Severe ischemic MR.  5. Depression 6. CAD: 12/22 late presentation inferior MI.   - LHC (1/23): Occluded RCA with collaterals, serial 50%/80%/80% mid-distal LCx stenoses, serial 60%/70%/50% proximal to mid LAD stenoses, 70% ostial D1. No good PCI targets and not a CABG candidate.  7. Chronic systolic CHF: TEE (0/35) with EF 35-40%, basal to mid inferior and inferolateral AK, RV normal, moderate AI, suspect moderate infarct-related MR. - Echo 06/23: EF 40-45%, moderate to severe ischemic MR - TEE 06/23: EF 25-30%, RV mildly reduced, severe ischemic MR  Review of Systems: All systems reviewed and negative except as per HPI.    Current Outpatient Medications  Medication Sig Dispense Refill   acetaminophen (TYLENOL) 650 MG CR tablet Take 1,300 mg by mouth every 8 (eight) hours as needed for pain.     albuterol (PROVENTIL) (2.5 MG/3ML)  0.083% nebulizer solution Take 3 mLs (2.5 mg total) by nebulization every 4 (four) hours as needed for wheezing or shortness of breath. 75 mL 2   ALPRAZolam (XANAX) 0.25 MG tablet Take 1 tablet (0.25 mg total) by mouth daily as needed for anxiety (anxiety with dialysis). 30 tablet 1   amiodarone (PACERONE) 200 MG tablet Take 1 tablet (200 mg total) by mouth daily. 90 tablet 2   apixaban (ELIQUIS)  5 MG TABS tablet Take 1 tablet (5 mg total) by mouth 2 (two) times daily. 60 tablet 6   atorvastatin (LIPITOR) 80 MG tablet Take 1 tablet (80 mg total) by mouth daily. (Patient taking differently: Take 80 mg by mouth at bedtime.) 90 tablet 3   cetirizine (ZYRTEC) 10 MG tablet Take 10 mg by mouth daily as needed for allergies.     cholecalciferol (VITAMIN D3) 25 MCG (1000 UNIT) tablet Take 1,000 Units by mouth in the morning.     ethyl chloride spray Apply 1 Application topically as needed (prior to dialysis).     gabapentin (NEURONTIN) 100 MG capsule Take 100 mg by mouth at bedtime.     hydrocortisone (ANUSOL-HC) 25 MG suppository Place 1 suppository (25 mg total) rectally 2 (two) times daily. (Patient taking differently: Place 25 mg rectally 2 (two) times daily as needed for hemorrhoids.) 12 suppository 0   melatonin 5 MG TABS Take 5-10 mg by mouth at bedtime as needed (sleep).     metoCLOPramide (REGLAN) 5 MG tablet Take 1 tablet (5 mg total) by mouth every 8 (eight) hours as needed for nausea. 6 tablet 0   midodrine (PROAMATINE) 10 MG tablet Take 1.5 tablets (15 mg total) by mouth 3 (three) times daily with meals. 150 tablet 6   nitroGLYCERIN (NITROSTAT) 0.4 MG SL tablet Place 1 tablet (0.4 mg total) under the tongue every 5 (five) minutes as needed for chest pain. 100 tablet 3   NONFORMULARY OR COMPOUNDED ITEM Place 1 Application rectally 2 (two) times daily as needed for hemorrhoids. Diltiazem 2% Ointment     omeprazole (PRILOSEC) 40 MG capsule Take 40 mg by mouth daily before breakfast.     ondansetron (ZOFRAN) 4 MG tablet Take 1 tablet (4 mg total) by mouth daily as needed for nausea or vomiting. 30 tablet 1   phenylephrine (,USE FOR PREPARATION-H,) 0.25 % suppository Place 1 suppository rectally 2 (two) times daily as needed for hemorrhoids.     polyethylene glycol powder (MIRALAX) 17 GM/SCOOP powder Take 17 g by mouth daily.     prochlorperazine (COMPAZINE) 5 MG tablet Take 5 mg by mouth 3  (three) times daily as needed for nausea or vomiting.     Propylene Glycol (SYSTANE COMPLETE) 0.6 % SOLN Place 1 drop into both eyes 3 (three) times daily as needed (dry/irritated eyes.).     sertraline (ZOLOFT) 25 MG tablet Take 25 mg by mouth in the morning.     traZODone (DESYREL) 100 MG tablet Take 100 mg by mouth at bedtime as needed for sleep.     hydrOXYzine (ATARAX) 10 MG tablet Take 10 mg by mouth at bedtime as needed for anxiety. (Patient not taking: Reported on 01/28/2022)     magnesium oxide (MAG-OX) 400 (240 Mg) MG tablet Take 400 mg by mouth daily in the afternoon. (Patient not taking: Reported on 01/28/2022)     mirtazapine (REMERON) 30 MG tablet Take 30 mg by mouth at bedtime. (Patient not taking: Reported on 01/09/2022)     predniSONE (DELTASONE) 20  MG tablet Take 20-40 mg by mouth See admin instructions. Take 2 tablets (40 mg) by mouth x 5 days then decrease & take 1 tablet (20 mg) by mouth x 5 days. (Patient not taking: Reported on 01/28/2022)     No current facility-administered medications for this encounter.   Allergies  Allergen Reactions   Other Other (See Comments)    Pollen - sneezing, itching/watery eyes   Social History   Socioeconomic History   Marital status: Single    Spouse name: Not on file   Number of children: Not on file   Years of education: Not on file   Highest education level: Not on file  Occupational History   Not on file  Tobacco Use   Smoking status: Former    Packs/[name]: 1.50    Years: 20.00    Total pack years: 30.00    Types: Cigarettes    Quit date: 04/28/2021    Years since quitting: 0.7   Smokeless tobacco: Never   Tobacco comments:    down to 2 packs per week; 3/21 - down to 1 Pack per week  Vaping Use   Vaping Use: Never used  Substance and Sexual Activity   Alcohol use: No    Alcohol/week: 0.0 standard drinks of alcohol   Drug use: No   Sexual activity: Not on file  Other Topics Concern   Not on file  Social History  Narrative   Caffiene- rare.    Education: HS   Retired: Administrator.    Social Determinants of Health   Financial Resource Strain: Medium Risk (05/02/2021)   Overall Financial Resource Strain (CARDIA)    Difficulty of Paying Living Expenses: Somewhat hard  Food Insecurity: No Food Insecurity (05/02/2021)   Hunger Vital Sign    Worried About Running Out of Food in the Last Year: Never true    Ran Out of Food in the Last Year: Never true  Transportation Needs: No Transportation Needs (05/02/2021)   PRAPARE - Hydrologist (Medical): No    Lack of Transportation (Non-Medical): No  Physical Activity: Not on file  Stress: Not on file  Social Connections: Not on file  Intimate Partner Violence: Not on file   Family History  Problem Relation Age of Onset   Heart attack Brother    Diabetes Brother    Heart disease Brother    Cancer Father    Diabetes Brother    Heart disease Brother    BP 100/60   Pulse 76   Wt 70.5 kg (155 lb 6.4 oz)   SpO2 95%   BMI 21.67 kg/m   Wt Readings from Last 3 Encounters:  01/28/22 70.5 kg (155 lb 6.4 oz)  01/02/22 70.1 kg (154 lb 9.6 oz)  11/26/21 71.4 kg (157 lb 6.4 oz)   PHYSICAL EXAM: General: NAD Neck: No JVD, no thyromegaly or thyroid nodule.  Lungs: Clear to auscultation bilaterally with normal respiratory effort. CV: Nondisplaced PMI.  Heart regular S1/S2, no S3/S4, 2/6 HSM apex.  No peripheral edema.  No carotid bruit.  Normal pedal pulses.  Abdomen: Soft, nontender, no hepatosplenomegaly, no distention.  Skin: Intact without lesions or rashes.  Neurologic: Alert and oriented x 3.  Psych: Normal affect. Extremities: No clubbing or cyanosis.  HEENT: Normal.   ASSESSMENT & PLAN: 1. CAD: Suspect delayed presentation inferior MI in 12/22.  He was not cathed initially due to AKI and delayed presentation. Eventual LHC with occluded RCA (culprit), diffuse  moderate disease in LAD and LCx. No good PCI targets.  Not a candidate for CABG. No chest pain now.   - Continue atorvastatin, check lipids today.  - He is on Eliquis so no ASA.   - He was able to walk into the office today so should be able to manage cardiac rehab.  Will refer him back over there.  2. Chronic systolic CHF: Ischemic cardiomyopathy with infarct-related MR. Echo in 12/22 with EF 35-40%, inferolateral and basal inferior akinesis, moderate-severe eccentric likely infarct-related MR, normal RV.  TEE in 1/23 with EF 35-40%, moderate MR (suspect infarct-related), aortic dilatation measuring 44 mm, moderate AI. Cardiogenic shock requiring milrinone during 12/22-1/23 hospitalization, titrated off.  Now requiring midodrine to maintain BP with HD. Echo 6/23 with EF 40-45%, RV mildly reduced, moderate to severe ischemic MR. TEE in 6/23 with EF 25-30%, RV mildly reduced, severe MR.  On exam, he is not volume overloaded. NYHA class III chronically, mainly due to fatigue.  - Continue midodrine 15 mg TID - HD for volume management.  - Unable to initiate GDMT due to low BP.  3. Valvular heart disease:  Severe ischemic MR on 6/23 TEE.  Not candidate for Mitraclip per structural heat team due to poor functional capacity and challenging imaging.  4. Atrial fibrillation: First noted in 12/22, s/p TEE-guided DCCV in 1/23. NSR today.  - Continue amiodarone 200 mg daily. Check LFTs and TSH today, will need regular eye exam.  - Continue Eliquis 5 mg bid. 5. ESRD: Patient has trouble with hypotension with HD.   - Now on midodrine 15 mg TID 6. Smoker: Quit 04/28/21. 7. ETOH abuse: Last drink was 04/28/21.   Followup 4 months with APP.   Loralie Champagne 01/28/2022

## 2022-01-29 ENCOUNTER — Other Ambulatory Visit: Payer: Self-pay | Admitting: Internal Medicine

## 2022-01-29 ENCOUNTER — Ambulatory Visit (HOSPITAL_COMMUNITY): Payer: Medicare Other

## 2022-01-29 DIAGNOSIS — D509 Iron deficiency anemia, unspecified: Secondary | ICD-10-CM | POA: Diagnosis not present

## 2022-01-29 DIAGNOSIS — Z992 Dependence on renal dialysis: Secondary | ICD-10-CM | POA: Diagnosis not present

## 2022-01-29 DIAGNOSIS — N186 End stage renal disease: Secondary | ICD-10-CM | POA: Diagnosis not present

## 2022-01-29 DIAGNOSIS — R5383 Other fatigue: Secondary | ICD-10-CM | POA: Diagnosis not present

## 2022-01-29 DIAGNOSIS — R1033 Periumbilical pain: Secondary | ICD-10-CM | POA: Diagnosis not present

## 2022-01-29 DIAGNOSIS — R1011 Right upper quadrant pain: Secondary | ICD-10-CM

## 2022-01-29 DIAGNOSIS — N2581 Secondary hyperparathyroidism of renal origin: Secondary | ICD-10-CM | POA: Diagnosis not present

## 2022-01-29 DIAGNOSIS — R110 Nausea: Secondary | ICD-10-CM | POA: Diagnosis not present

## 2022-01-30 DIAGNOSIS — I480 Paroxysmal atrial fibrillation: Secondary | ICD-10-CM | POA: Diagnosis not present

## 2022-01-30 DIAGNOSIS — E7800 Pure hypercholesterolemia, unspecified: Secondary | ICD-10-CM | POA: Diagnosis not present

## 2022-01-30 DIAGNOSIS — K219 Gastro-esophageal reflux disease without esophagitis: Secondary | ICD-10-CM | POA: Diagnosis not present

## 2022-01-30 DIAGNOSIS — N186 End stage renal disease: Secondary | ICD-10-CM | POA: Diagnosis not present

## 2022-01-31 ENCOUNTER — Inpatient Hospital Stay (HOSPITAL_COMMUNITY)
Admission: EM | Admit: 2022-01-31 | Discharge: 2022-02-05 | DRG: 377 | Disposition: A | Discharge status: Home / Self Care | Payer: Medicare Other | Attending: Internal Medicine | Admitting: Internal Medicine

## 2022-01-31 ENCOUNTER — Ambulatory Visit (HOSPITAL_COMMUNITY): Payer: Medicare Other

## 2022-01-31 ENCOUNTER — Other Ambulatory Visit: Payer: Self-pay

## 2022-01-31 ENCOUNTER — Encounter (HOSPITAL_COMMUNITY): Payer: Self-pay | Admitting: Emergency Medicine

## 2022-01-31 DIAGNOSIS — K31819 Angiodysplasia of stomach and duodenum without bleeding: Secondary | ICD-10-CM | POA: Diagnosis not present

## 2022-01-31 DIAGNOSIS — I132 Hypertensive heart and chronic kidney disease with heart failure and with stage 5 chronic kidney disease, or end stage renal disease: Secondary | ICD-10-CM | POA: Diagnosis not present

## 2022-01-31 DIAGNOSIS — F419 Anxiety disorder, unspecified: Secondary | ICD-10-CM | POA: Diagnosis present

## 2022-01-31 DIAGNOSIS — R54 Age-related physical debility: Secondary | ICD-10-CM | POA: Diagnosis not present

## 2022-01-31 DIAGNOSIS — Z7901 Long term (current) use of anticoagulants: Secondary | ICD-10-CM

## 2022-01-31 DIAGNOSIS — N2581 Secondary hyperparathyroidism of renal origin: Secondary | ICD-10-CM | POA: Diagnosis present

## 2022-01-31 DIAGNOSIS — I120 Hypertensive chronic kidney disease with stage 5 chronic kidney disease or end stage renal disease: Secondary | ICD-10-CM | POA: Diagnosis not present

## 2022-01-31 DIAGNOSIS — F1011 Alcohol abuse, in remission: Secondary | ICD-10-CM | POA: Diagnosis present

## 2022-01-31 DIAGNOSIS — Z992 Dependence on renal dialysis: Secondary | ICD-10-CM | POA: Diagnosis not present

## 2022-01-31 DIAGNOSIS — I340 Nonrheumatic mitral (valve) insufficiency: Secondary | ICD-10-CM | POA: Diagnosis present

## 2022-01-31 DIAGNOSIS — N186 End stage renal disease: Secondary | ICD-10-CM | POA: Diagnosis not present

## 2022-01-31 DIAGNOSIS — I5022 Chronic systolic (congestive) heart failure: Secondary | ICD-10-CM | POA: Diagnosis not present

## 2022-01-31 DIAGNOSIS — I252 Old myocardial infarction: Secondary | ICD-10-CM

## 2022-01-31 DIAGNOSIS — I129 Hypertensive chronic kidney disease with stage 1 through stage 4 chronic kidney disease, or unspecified chronic kidney disease: Secondary | ICD-10-CM | POA: Diagnosis not present

## 2022-01-31 DIAGNOSIS — R531 Weakness: Secondary | ICD-10-CM | POA: Diagnosis not present

## 2022-01-31 DIAGNOSIS — D631 Anemia in chronic kidney disease: Secondary | ICD-10-CM | POA: Diagnosis not present

## 2022-01-31 DIAGNOSIS — Z79899 Other long term (current) drug therapy: Secondary | ICD-10-CM

## 2022-01-31 DIAGNOSIS — R17 Unspecified jaundice: Secondary | ICD-10-CM | POA: Diagnosis present

## 2022-01-31 DIAGNOSIS — K31811 Angiodysplasia of stomach and duodenum with bleeding: Principal | ICD-10-CM | POA: Diagnosis present

## 2022-01-31 DIAGNOSIS — D6832 Hemorrhagic disorder due to extrinsic circulating anticoagulants: Secondary | ICD-10-CM | POA: Diagnosis not present

## 2022-01-31 DIAGNOSIS — R9431 Abnormal electrocardiogram [ECG] [EKG]: Secondary | ICD-10-CM | POA: Diagnosis not present

## 2022-01-31 DIAGNOSIS — I2510 Atherosclerotic heart disease of native coronary artery without angina pectoris: Secondary | ICD-10-CM | POA: Diagnosis present

## 2022-01-31 DIAGNOSIS — R7401 Elevation of levels of liver transaminase levels: Secondary | ICD-10-CM | POA: Diagnosis present

## 2022-01-31 DIAGNOSIS — F32A Depression, unspecified: Secondary | ICD-10-CM | POA: Diagnosis present

## 2022-01-31 DIAGNOSIS — M1990 Unspecified osteoarthritis, unspecified site: Secondary | ICD-10-CM | POA: Diagnosis not present

## 2022-01-31 DIAGNOSIS — K922 Gastrointestinal hemorrhage, unspecified: Secondary | ICD-10-CM | POA: Diagnosis not present

## 2022-01-31 DIAGNOSIS — D696 Thrombocytopenia, unspecified: Secondary | ICD-10-CM | POA: Diagnosis not present

## 2022-01-31 DIAGNOSIS — Z87891 Personal history of nicotine dependence: Secondary | ICD-10-CM | POA: Diagnosis not present

## 2022-01-31 DIAGNOSIS — E785 Hyperlipidemia, unspecified: Secondary | ICD-10-CM | POA: Diagnosis not present

## 2022-01-31 DIAGNOSIS — N250 Renal osteodystrophy: Secondary | ICD-10-CM | POA: Diagnosis not present

## 2022-01-31 DIAGNOSIS — E875 Hyperkalemia: Secondary | ICD-10-CM | POA: Diagnosis not present

## 2022-01-31 DIAGNOSIS — E8809 Other disorders of plasma-protein metabolism, not elsewhere classified: Secondary | ICD-10-CM | POA: Diagnosis not present

## 2022-01-31 DIAGNOSIS — I480 Paroxysmal atrial fibrillation: Secondary | ICD-10-CM | POA: Diagnosis not present

## 2022-01-31 DIAGNOSIS — I10 Essential (primary) hypertension: Secondary | ICD-10-CM | POA: Diagnosis not present

## 2022-01-31 DIAGNOSIS — D62 Acute posthemorrhagic anemia: Secondary | ICD-10-CM | POA: Diagnosis present

## 2022-01-31 DIAGNOSIS — R946 Abnormal results of thyroid function studies: Secondary | ICD-10-CM | POA: Diagnosis present

## 2022-01-31 DIAGNOSIS — Z91158 Patient's noncompliance with renal dialysis for other reason: Secondary | ICD-10-CM

## 2022-01-31 DIAGNOSIS — K2270 Barrett's esophagus without dysplasia: Secondary | ICD-10-CM

## 2022-01-31 DIAGNOSIS — R062 Wheezing: Secondary | ICD-10-CM | POA: Diagnosis not present

## 2022-01-31 DIAGNOSIS — K921 Melena: Secondary | ICD-10-CM

## 2022-01-31 DIAGNOSIS — Z8249 Family history of ischemic heart disease and other diseases of the circulatory system: Secondary | ICD-10-CM

## 2022-01-31 DIAGNOSIS — Z66 Do not resuscitate: Secondary | ICD-10-CM | POA: Diagnosis not present

## 2022-01-31 DIAGNOSIS — T45515A Adverse effect of anticoagulants, initial encounter: Secondary | ICD-10-CM | POA: Diagnosis present

## 2022-01-31 DIAGNOSIS — K5520 Angiodysplasia of colon without hemorrhage: Secondary | ICD-10-CM | POA: Diagnosis not present

## 2022-01-31 DIAGNOSIS — Z833 Family history of diabetes mellitus: Secondary | ICD-10-CM

## 2022-01-31 DIAGNOSIS — M898X9 Other specified disorders of bone, unspecified site: Secondary | ICD-10-CM | POA: Diagnosis present

## 2022-01-31 DIAGNOSIS — I080 Rheumatic disorders of both mitral and aortic valves: Secondary | ICD-10-CM | POA: Diagnosis not present

## 2022-01-31 DIAGNOSIS — K7460 Unspecified cirrhosis of liver: Secondary | ICD-10-CM | POA: Diagnosis present

## 2022-01-31 LAB — I-STAT CHEM 8, ED
BUN: 59 mg/dL — ABNORMAL HIGH (ref 8–23)
Calcium, Ion: 1.1 mmol/L — ABNORMAL LOW (ref 1.15–1.40)
Chloride: 95 mmol/L — ABNORMAL LOW (ref 98–111)
Creatinine, Ser: 8.4 mg/dL — ABNORMAL HIGH (ref 0.61–1.24)
Glucose, Bld: 93 mg/dL (ref 70–99)
HCT: 26 % — ABNORMAL LOW (ref 39.0–52.0)
Hemoglobin: 8.8 g/dL — ABNORMAL LOW (ref 13.0–17.0)
Potassium: 5.7 mmol/L — ABNORMAL HIGH (ref 3.5–5.1)
Sodium: 135 mmol/L (ref 135–145)
TCO2: 30 mmol/L (ref 22–32)

## 2022-01-31 LAB — COMPREHENSIVE METABOLIC PANEL
ALT: 47 U/L — ABNORMAL HIGH (ref 0–44)
AST: 43 U/L — ABNORMAL HIGH (ref 15–41)
Albumin: 3 g/dL — ABNORMAL LOW (ref 3.5–5.0)
Alkaline Phosphatase: 68 U/L (ref 38–126)
Anion gap: 17 — ABNORMAL HIGH (ref 5–15)
BUN: 64 mg/dL — ABNORMAL HIGH (ref 8–23)
CO2: 27 mmol/L (ref 22–32)
Calcium: 10 mg/dL (ref 8.9–10.3)
Chloride: 94 mmol/L — ABNORMAL LOW (ref 98–111)
Creatinine, Ser: 7.78 mg/dL — ABNORMAL HIGH (ref 0.61–1.24)
GFR, Estimated: 7 mL/min — ABNORMAL LOW (ref >60)
Glucose, Bld: 104 mg/dL — ABNORMAL HIGH (ref 70–99)
Potassium: 5.9 mmol/L — ABNORMAL HIGH (ref 3.5–5.1)
Sodium: 138 mmol/L (ref 135–145)
Total Bilirubin: 1.8 mg/dL — ABNORMAL HIGH (ref 0.3–1.2)
Total Protein: 6.2 g/dL — ABNORMAL LOW (ref 6.5–8.1)

## 2022-01-31 LAB — CBC
HCT: 25.8 % — ABNORMAL LOW (ref 39.0–52.0)
Hemoglobin: 8.8 g/dL — ABNORMAL LOW (ref 13.0–17.0)
MCH: 32.4 pg (ref 26.0–34.0)
MCHC: 34.1 g/dL (ref 30.0–36.0)
MCV: 94.9 fL (ref 80.0–100.0)
Platelets: 128 10*3/uL — ABNORMAL LOW (ref 150–400)
RBC: 2.72 MIL/uL — ABNORMAL LOW (ref 4.22–5.81)
RDW: 20.1 % — ABNORMAL HIGH (ref 11.5–15.5)
WBC: 5.6 10*3/uL (ref 4.0–10.5)
nRBC: 0 % (ref 0.0–0.2)

## 2022-01-31 LAB — ABO/RH: ABO/RH(D): A POS

## 2022-01-31 NOTE — ED Triage Notes (Signed)
Pt c/o dark, tarry stools x 3 days. Denies dizziness, chest pain, shortness of breath. Dialysis MWF, missed dialysis today.

## 2022-01-31 NOTE — ED Provider Triage Note (Signed)
Emergency Medicine Provider Triage Evaluation Note  Ruben [name] , a 76 y.o. male  was evaluated in triage.  Pt complains of black tarry stools x5 to 6 days.  He is on Eliquis for A-fib.  End-stage renal disease on hemodialysis Monday Wednesday Friday.  He went Monday Wednesday but missed today.  He denies lightheadedness or near syncope.  Review of Systems  Positive: Dark stool Negative: Abdominal pain  Physical Exam  BP 114/61   Pulse 82   Temp 98.4 F (36.9 C) (Oral)   Resp 18   SpO2 94%  Gen:   Awake, no distress   Resp:  Normal effort  MSK:   Moves extremities without difficulty  Other:  No abdominal tenderness  Medical Decision Making  Medically screening exam initiated at 9:44 PM.  Appropriate orders placed.  Ruben [name] was informed that the remainder of the evaluation will be completed by another provider, this initial triage assessment does not replace that evaluation, and the importance of remaining in the ED until their evaluation is complete.  Work-up initiated   Margarita Mail, PA-C 01/31/22 [id number]

## 2022-02-01 DIAGNOSIS — K922 Gastrointestinal hemorrhage, unspecified: Secondary | ICD-10-CM | POA: Diagnosis not present

## 2022-02-01 DIAGNOSIS — D62 Acute posthemorrhagic anemia: Secondary | ICD-10-CM | POA: Diagnosis not present

## 2022-02-01 DIAGNOSIS — I129 Hypertensive chronic kidney disease with stage 1 through stage 4 chronic kidney disease, or unspecified chronic kidney disease: Secondary | ICD-10-CM | POA: Diagnosis not present

## 2022-02-01 DIAGNOSIS — N186 End stage renal disease: Secondary | ICD-10-CM | POA: Diagnosis not present

## 2022-02-01 DIAGNOSIS — K921 Melena: Secondary | ICD-10-CM | POA: Diagnosis not present

## 2022-02-01 DIAGNOSIS — Z7901 Long term (current) use of anticoagulants: Secondary | ICD-10-CM | POA: Diagnosis not present

## 2022-02-01 DIAGNOSIS — Z992 Dependence on renal dialysis: Secondary | ICD-10-CM | POA: Diagnosis not present

## 2022-02-01 LAB — PROTIME-INR
INR: 4.6 (ref 0.8–1.2)
Prothrombin Time: 43 seconds — ABNORMAL HIGH (ref 11.4–15.2)

## 2022-02-01 LAB — PREPARE RBC (CROSSMATCH)

## 2022-02-01 LAB — RENAL FUNCTION PANEL
Albumin: 2.7 g/dL — ABNORMAL LOW (ref 3.5–5.0)
Anion gap: 16 — ABNORMAL HIGH (ref 5–15)
BUN: 78 mg/dL — ABNORMAL HIGH (ref 8–23)
CO2: 26 mmol/L (ref 22–32)
Calcium: 9.8 mg/dL (ref 8.9–10.3)
Chloride: 97 mmol/L — ABNORMAL LOW (ref 98–111)
Creatinine, Ser: 8.2 mg/dL — ABNORMAL HIGH (ref 0.61–1.24)
GFR, Estimated: 6 mL/min — ABNORMAL LOW (ref >60)
Glucose, Bld: 95 mg/dL (ref 70–99)
Phosphorus: 2.8 mg/dL (ref 2.5–4.6)
Potassium: 5.8 mmol/L — ABNORMAL HIGH (ref 3.5–5.1)
Sodium: 139 mmol/L (ref 135–145)

## 2022-02-01 LAB — HEMOGLOBIN AND HEMATOCRIT, BLOOD
HCT: 28.3 % — ABNORMAL LOW (ref 39.0–52.0)
Hemoglobin: 10 g/dL — ABNORMAL LOW (ref 13.0–17.0)

## 2022-02-01 LAB — POC OCCULT BLOOD, ED: Fecal Occult Bld: POSITIVE — AB

## 2022-02-01 LAB — HEPATITIS B SURFACE ANTIBODY,QUALITATIVE: Hep B S Ab: REACTIVE — AB

## 2022-02-01 LAB — HEPATITIS B CORE ANTIBODY, TOTAL: Hep B Core Total Ab: NONREACTIVE

## 2022-02-01 LAB — HEPATITIS B SURFACE ANTIGEN: Hepatitis B Surface Ag: NONREACTIVE

## 2022-02-01 LAB — T4, FREE: Free T4: 1.18 ng/dL — ABNORMAL HIGH (ref 0.61–1.12)

## 2022-02-01 MED ORDER — SODIUM CHLORIDE 0.9% FLUSH
3.0000 mL | Freq: Two times a day (BID) | INTRAVENOUS | Status: DC
  Administered 2022-02-01 – 2022-02-04 (×7): 3 mL via INTRAVENOUS

## 2022-02-01 MED ORDER — GABAPENTIN 100 MG PO CAPS
100.0000 mg | ORAL_CAPSULE | Freq: Every day | ORAL | Status: DC
  Administered 2022-02-01 – 2022-02-04 (×4): 100 mg via ORAL
  Filled 2022-02-01 (×4): qty 1

## 2022-02-01 MED ORDER — HYDROXYZINE HCL 10 MG PO TABS: 10.0000 mg | ORAL_TABLET | Freq: Every evening | ORAL | Status: DC | PRN

## 2022-02-01 MED ORDER — VITAMIN K1 10 MG/ML IJ SOLN
5.0000 mg | Freq: Once | INTRAVENOUS | Status: AC
  Administered 2022-02-01: 5 mg via INTRAVENOUS
  Filled 2022-02-01: qty 0.5

## 2022-02-01 MED ORDER — POLYVINYL ALCOHOL 1.4 % OP SOLN: 1.0000 [drp] | Freq: Three times a day (TID) | OPHTHALMIC | Status: DC | PRN

## 2022-02-01 MED ORDER — CALCIUM GLUCONATE-NACL 1-0.675 GM/50ML-% IV SOLN
1.0000 g | Freq: Once | INTRAVENOUS | Status: AC
  Administered 2022-02-01: 1000 mg via INTRAVENOUS
  Filled 2022-02-01: qty 50

## 2022-02-01 MED ORDER — ACETAMINOPHEN 325 MG PO TABS
650.0000 mg | ORAL_TABLET | Freq: Four times a day (QID) | ORAL | Status: DC | PRN
  Administered 2022-02-03: 650 mg via ORAL
  Filled 2022-02-01: qty 2

## 2022-02-01 MED ORDER — SODIUM BICARBONATE 8.4 % IV SOLN
50.0000 meq | Freq: Once | INTRAVENOUS | Status: AC
  Administered 2022-02-01: 50 meq via INTRAVENOUS
  Filled 2022-02-01: qty 50

## 2022-02-01 MED ORDER — ACETAMINOPHEN 650 MG RE SUPP: 650.0000 mg | Freq: Four times a day (QID) | RECTAL | Status: DC | PRN

## 2022-02-01 MED ORDER — NITROGLYCERIN 0.4 MG SL SUBL: 0.4000 mg | SUBLINGUAL_TABLET | SUBLINGUAL | Status: DC | PRN

## 2022-02-01 MED ORDER — ATORVASTATIN CALCIUM 80 MG PO TABS
80.0000 mg | ORAL_TABLET | Freq: Every day | ORAL | Status: DC
  Administered 2022-02-01 – 2022-02-04 (×4): 80 mg via ORAL
  Filled 2022-02-01 (×3): qty 1
  Filled 2022-02-01: qty 2

## 2022-02-01 MED ORDER — ALPRAZOLAM 0.25 MG PO TABS: 0.2500 mg | ORAL_TABLET | Freq: Every day | ORAL | Status: DC | PRN

## 2022-02-01 MED ORDER — SODIUM ZIRCONIUM CYCLOSILICATE 10 G PO PACK
10.0000 g | PACK | ORAL | Status: AC
  Administered 2022-02-01: 10 g via ORAL
  Filled 2022-02-01: qty 1

## 2022-02-01 MED ORDER — PANTOPRAZOLE 80MG IVPB - SIMPLE MED
80.0000 mg | Freq: Once | INTRAVENOUS | Status: AC
  Administered 2022-02-01: 80 mg via INTRAVENOUS
  Filled 2022-02-01: qty 100

## 2022-02-01 MED ORDER — SODIUM CHLORIDE 0.9 % IV BOLUS
500.0000 mL | Freq: Once | INTRAVENOUS | Status: AC
  Administered 2022-02-01: 500 mL via INTRAVENOUS

## 2022-02-01 MED ORDER — MIDODRINE HCL 5 MG PO TABS
15.0000 mg | ORAL_TABLET | Freq: Three times a day (TID) | ORAL | Status: DC
  Administered 2022-02-01 – 2022-02-05 (×10): 15 mg via ORAL
  Filled 2022-02-01 (×10): qty 3

## 2022-02-01 MED ORDER — AMIODARONE HCL 200 MG PO TABS
200.0000 mg | ORAL_TABLET | Freq: Every day | ORAL | Status: DC
  Administered 2022-02-02 – 2022-02-05 (×4): 200 mg via ORAL
  Filled 2022-02-01 (×4): qty 1

## 2022-02-01 MED ORDER — PANTOPRAZOLE INFUSION (NEW) - SIMPLE MED
8.0000 mg/h | INTRAVENOUS | Status: AC
  Administered 2022-02-01 – 2022-02-04 (×7): 8 mg/h via INTRAVENOUS
  Filled 2022-02-01 (×6): qty 100
  Filled 2022-02-01 (×2): qty 80
  Filled 2022-02-01: qty 100

## 2022-02-01 MED ORDER — ALBUTEROL SULFATE (2.5 MG/3ML) 0.083% IN NEBU
2.5000 mg | INHALATION_SOLUTION | RESPIRATORY_TRACT | Status: AC
  Administered 2022-02-01: 2.5 mg via RESPIRATORY_TRACT
  Filled 2022-02-01: qty 3

## 2022-02-01 MED ORDER — PANTOPRAZOLE SODIUM 40 MG IV SOLR
40.0000 mg | Freq: Two times a day (BID) | INTRAVENOUS | Status: DC
  Administered 2022-02-04: 40 mg via INTRAVENOUS
  Filled 2022-02-01 (×2): qty 10

## 2022-02-01 MED ORDER — SODIUM CHLORIDE 0.9% IV SOLUTION: Freq: Once | INTRAVENOUS | Status: AC

## 2022-02-01 MED ORDER — DARBEPOETIN ALFA 100 MCG/0.5ML IJ SOSY
100.0000 ug | PREFILLED_SYRINGE | INTRAMUSCULAR | Status: DC
  Administered 2022-02-01: 100 ug via INTRAVENOUS
  Filled 2022-02-01: qty 0.5

## 2022-02-01 MED ORDER — ALBUTEROL SULFATE (2.5 MG/3ML) 0.083% IN NEBU
2.5000 mg | INHALATION_SOLUTION | RESPIRATORY_TRACT | Status: DC | PRN
  Administered 2022-02-03 – 2022-02-04 (×4): 2.5 mg via RESPIRATORY_TRACT
  Filled 2022-02-01 (×5): qty 3

## 2022-02-01 NOTE — ED Provider Notes (Signed)
[facility] EMERGENCY DEPARTMENT Provider Note   CSN: [id number] Arrival date & time: 01/31/22  2128     History  Chief Complaint  Patient presents with   GI Bleeding    Ruben [name] is a 76 y.o. male.  The history is provided by the patient and a relative.  Rectal Bleeding Quality:  Black and tarry Amount:  Moderate Duration:  2 days Timing:  Intermittent Chronicity:  New Context: defecation   Relieved by:  Nothing Worsened by:  Nothing Ineffective treatments:  None tried Associated symptoms: no fever and no vomiting   Associated symptoms comment:  Had early satiety and discomfort with eating earlier in the week and saw Dr. Benson Norway on Wednesday.   Risk factors: anticoagulant use   Patient on anticoagulation who is also MWF dialysis at [facility] presents with melena x 2 days.  Seen earlier in the week by Dr. Benson Norway of GI for early satiety and pain with meals.     Past Medical History:  Diagnosis Date   Arthritis    Chronic kidney disease, stage 3b ([place])    ESRD (end stage renal disease) ([place])    ETOH abuse    History of stress test 09/02/2008   showed inferolateral scar without ischemia   Hx of echocardiogram 09/02/2008   was essentially normal   Hyperlipidemia    Hypertension    Myocardial infarction [facility])    Tobacco abuse        Home Medications Prior to Admission medications   Medication Sig Start Date End Date Taking? Authorizing Provider  acetaminophen (TYLENOL) 650 MG CR tablet Take 1,300 mg by mouth every 8 (eight) hours as needed for pain.    [provider]  albuterol (PROVENTIL) (2.5 MG/3ML) 0.083% nebulizer solution Take 3 mLs (2.5 mg total) by nebulization every 4 (four) hours as needed for wheezing or shortness of breath. 04/03/21 04/03/22  [name], FNP  ALPRAZolam Duanne Moron) 0.25 MG tablet Take 1 tablet (0.25 mg total) by mouth daily as needed for anxiety (anxiety with dialysis). 09/17/21 09/17/22  Larey Dresser, MD  amiodarone (PACERONE) 200 MG tablet Take 1 tablet (200 mg total) by mouth daily. 11/11/21   Larey Dresser, MD  apixaban (ELIQUIS) 5 MG TABS tablet Take 1 tablet (5 mg total) by mouth 2 (two) times daily. 08/26/21   [place], Maricela Bo, FNP  atorvastatin (LIPITOR) 80 MG tablet Take 1 tablet (80 mg total) by mouth daily. Patient taking differently: Take 80 mg by mouth at bedtime. 11/21/21   Joette Catching, PA-C  cetirizine (ZYRTEC) 10 MG tablet Take 10 mg by mouth daily as needed for allergies.    [provider]  cholecalciferol (VITAMIN D3) 25 MCG (1000 UNIT) tablet Take 1,000 Units by mouth in the morning.    [provider]  ethyl chloride spray Apply 1 Application topically as needed (prior to dialysis).    [provider]  gabapentin (NEURONTIN) 100 MG capsule Take 100 mg by mouth at bedtime. 11/14/21   [provider]  hydrocortisone (ANUSOL-HC) 25 MG suppository Place 1 suppository (25 mg total) rectally 2 (two) times daily. Patient taking differently: Place 25 mg rectally 2 (two) times daily as needed for hemorrhoids. 11/29/21   Carlisle Cater, PA-C  hydrOXYzine (ATARAX) 10 MG tablet Take 10 mg by mouth at bedtime as needed for anxiety. Patient not taking: Reported on 01/28/2022    [provider]  magnesium oxide (MAG-OX) 400 (240 Mg) MG  tablet Take 400 mg by mouth daily in the afternoon. Patient not taking: Reported on 01/28/2022 07/05/21   [provider]  melatonin 5 MG TABS Take 5-10 mg by mouth at bedtime as needed (sleep).    [provider]  metoCLOPramide (REGLAN) 5 MG tablet Take 1 tablet (5 mg total) by mouth every 8 (eight) hours as needed for nausea. 07/12/21   Couture, Cortni S, PA-C  midodrine (PROAMATINE) 10 MG tablet Take 1.5 tablets (15 mg total) by mouth 3 (three) times daily with meals. 11/21/21   Joette Catching, PA-C  mirtazapine (REMERON) 30 MG tablet Take 30 mg by mouth at bedtime. Patient not  taking: Reported on 01/09/2022 10/15/21   [provider]  nitroGLYCERIN (NITROSTAT) 0.4 MG SL tablet Place 1 tablet (0.4 mg total) under the tongue every 5 (five) minutes as needed for chest pain. 05/28/21 05/28/22  Clegg, Amy D, NP  NONFORMULARY OR COMPOUNDED ITEM Place 1 Application rectally 2 (two) times daily as needed for hemorrhoids. Diltiazem 2% Ointment 11/29/21   [provider]  omeprazole (PRILOSEC) 40 MG capsule Take 40 mg by mouth daily before breakfast.    [provider]  ondansetron (ZOFRAN) 4 MG tablet Take 1 tablet (4 mg total) by mouth daily as needed for nausea or vomiting. 06/04/21 06/04/22  [name], FNP  phenylephrine (,USE FOR PREPARATION-H,) 0.25 % suppository Place 1 suppository rectally 2 (two) times daily as needed for hemorrhoids.    [provider]  polyethylene glycol powder (MIRALAX) 17 GM/SCOOP powder Take 17 g by mouth daily.    [provider]  predniSONE (DELTASONE) 20 MG tablet Take 20-40 mg by mouth See admin instructions. Take 2 tablets (40 mg) by mouth x 5 days then decrease & take 1 tablet (20 mg) by mouth x 5 days. Patient not taking: Reported on 01/28/2022    [provider]  prochlorperazine (COMPAZINE) 5 MG tablet Take 5 mg by mouth 3 (three) times daily as needed for nausea or vomiting. 09/05/21   [provider]  Propylene Glycol (SYSTANE COMPLETE) 0.6 % SOLN Place 1 drop into both eyes 3 (three) times daily as needed (dry/irritated eyes.).    [provider]  sertraline (ZOLOFT) 25 MG tablet Take 25 mg by mouth in the morning. 10/23/21   [provider]  traZODone (DESYREL) 100 MG tablet Take 100 mg by mouth at bedtime as needed for sleep. 12/26/21   [provider]      Allergies    Other    Review of Systems   Review of Systems  Constitutional:  Negative for fever.  HENT:  Negative for facial swelling.   Respiratory:  Negative for wheezing and stridor.    Cardiovascular:  Negative for chest pain.  Gastrointestinal:  Positive for anal bleeding, blood in stool and hematochezia. Negative for vomiting.  All other systems reviewed and are negative.   Physical Exam Updated Vital Signs BP 104/71   Pulse 78   Temp 98.7 F (37.1 C) (Oral)   Resp (!) 21   SpO2 96%  Physical Exam Vitals and nursing note reviewed. Exam conducted with a chaperone present.  Constitutional:      General: He is not in acute distress.    Appearance: He is well-developed. He is not diaphoretic.  HENT:     Head: Normocephalic and atraumatic.     Nose: Nose normal.  Eyes:     Pupils: Pupils are equal, round, and reactive to light.  Comments: Pale conjunctiva   Cardiovascular:     Rate and Rhythm: Normal rate and regular rhythm.     Pulses: Normal pulses.     Heart sounds: Normal heart sounds.  Pulmonary:     Effort: Pulmonary effort is normal.     Breath sounds: Normal breath sounds. No wheezing or rales.  Abdominal:     General: Bowel sounds are normal.     Palpations: Abdomen is soft.     Tenderness: There is no abdominal tenderness. There is no guarding or rebound.  Genitourinary:    Rectum: Guaiac result positive.  Musculoskeletal:        General: Normal range of motion.     Cervical back: Normal range of motion and neck supple.  Skin:    General: Skin is warm and dry.     Capillary Refill: Capillary refill takes less than 2 seconds.  Neurological:     Mental Status: He is alert and oriented to person, place, and time.     Deep Tendon Reflexes: Reflexes normal.  Psychiatric:        Mood and Affect: Mood normal.        Behavior: Behavior normal.     ED Results / Procedures / Treatments   Labs (all labs ordered are listed, but only abnormal results are displayed) Results for orders placed or performed during the hospital encounter of 01/31/22  Comprehensive metabolic panel  Result Value Ref Range   Sodium 138 135 - 145 mmol/L   Potassium  5.9 (H) 3.5 - 5.1 mmol/L   Chloride 94 (L) 98 - 111 mmol/L   CO2 27 22 - 32 mmol/L   Glucose, Bld 104 (H) 70 - 99 mg/dL   BUN 64 (H) 8 - 23 mg/dL   Creatinine, Ser 7.78 (H) 0.61 - 1.24 mg/dL   Calcium 10.0 8.9 - 10.3 mg/dL   Total Protein 6.2 (L) 6.5 - 8.1 g/dL   Albumin 3.0 (L) 3.5 - 5.0 g/dL   AST 43 (H) 15 - 41 U/L   ALT 47 (H) 0 - 44 U/L   Alkaline Phosphatase 68 38 - 126 U/L   Total Bilirubin 1.8 (H) 0.3 - 1.2 mg/dL   GFR, Estimated 7 (L) >60 mL/min   Anion gap 17 (H) 5 - 15  CBC  Result Value Ref Range   WBC 5.6 4.0 - 10.5 K/uL   RBC 2.72 (L) 4.22 - 5.81 MIL/uL   Hemoglobin 8.8 (L) 13.0 - 17.0 g/dL   HCT 25.8 (L) 39.0 - 52.0 %   MCV 94.9 80.0 - 100.0 fL   MCH 32.4 26.0 - 34.0 pg   MCHC 34.1 30.0 - 36.0 g/dL   RDW 20.1 (H) 11.5 - 15.5 %   Platelets 128 (L) 150 - 400 K/uL   nRBC 0.0 0.0 - 0.2 %  I-stat chem 8, ED (not at [facility] or [facility])  Result Value Ref Range   Sodium 135 135 - 145 mmol/L   Potassium 5.7 (H) 3.5 - 5.1 mmol/L   Chloride 95 (L) 98 - 111 mmol/L   BUN 59 (H) 8 - 23 mg/dL   Creatinine, Ser 8.40 (H) 0.61 - 1.24 mg/dL   Glucose, Bld 93 70 - 99 mg/dL   Calcium, Ion 1.10 (L) 1.15 - 1.40 mmol/L   TCO2 30 22 - 32 mmol/L   Hemoglobin 8.8 (L) 13.0 - 17.0 g/dL   HCT 26.0 (L) 39.0 - 52.0 %  Type and screen  Result Value Ref Range  ABO/RH(D) A POS    Antibody Screen NEG    Sample Expiration      02/03/2022,2359 Performed at [facility], [place] [address], [place], [place] [address]   ABO/Rh  Result Value Ref Range   ABO/RH(D)      A POS Performed at [facility] [address], [place], [place] [address]    DG THORACOLUMABAR SPINE  Result Date: 01/24/2022 CLINICAL DATA:  Acute thoracolumbar back pain.  Leg weakness. EXAM: THORACOLUMBAR SPINE 1V COMPARISON:  02/25/2021. FINDINGS: No fracture, bone lesion or spondylolisthesis. Straightened lumbar lordosis. Mild loss of disc height at L3-L4 with moderate loss of disc height at L4-L5 and L5-S1.  Anterior endplate osteophytes noted at these levels. Remaining visualized disc spaces are well preserved. No other degenerative change. Subtle sclerosis noted along superior aspect of the right femoral head suggests avascular necrosis without subchondral collapse, which was present on the prior exam. Small density projects to the right of the upper lumbar spine, stable reflecting a vascular calcification based on the CT performed on 11/29/2021. IMPRESSION: 1. No fracture or acute finding.  No spondylolisthesis. 2. Degenerative changes from L3-L4 through L5-S1 and straightening of the normal lumbar lordosis, findings stable from prior exam. 3. Chronic right femoral head avascular necrosis. Electronically Signed   By: Lajean Manes M.D.   On: 01/24/2022 14:15    EKG None  Radiology No results found.  Procedures Procedures    Medications Ordered in ED Medications  sodium zirconium cyclosilicate (LOKELMA) packet 10 g (has no administration in time range)  pantoprazole (PROTONIX) 80 mg /NS 100 mL IVPB (has no administration in time range)  pantoprozole (PROTONIX) 80 mg /NS 100 mL infusion (has no administration in time range)  pantoprazole (PROTONIX) injection 40 mg (has no administration in time range)    ED Course/ Medical Decision Making/ A&P                           Medical Decision Making Patient on anticoagulation presents with 2 days of melena   Amount and/or Complexity of Data Reviewed Independent Historian:     Details: Relative see above  External Data Reviewed: notes.    Details: Previous notes reviewed  Labs: ordered.    Details: All labs reviewed:  normal sodium, potassium high 5.9 then 5.7,  elevated BUN 64 and creatinine 7.78.  Normal white count,  hemoglobin low 8.8, platelet count low 128K.  Elevated bilirubin 1.8 and elevated AST and ALT Discussion of management or test interpretation with external provider(s): Dr. Bryan Lemma of GI on call for Dr. Benson Norway secure chat.     Risk Prescription drug management. Decision regarding hospitalization.    Final Clinical Impression(s) / ED Diagnoses Final diagnoses:  Gastrointestinal hemorrhage, unspecified gastrointestinal hemorrhage type   The patient appears reasonably stabilized for admission considering the current resources, flow, and capabilities available in the ED at this time, and I doubt any other [facility] requiring further screening and/or treatment in the ED prior to admission.  Rx / DC Orders ED Discharge Orders     None         Chivonne Rascon, MD 02/01/22 [phone]

## 2022-02-01 NOTE — ED Notes (Signed)
Pt placement called to get bed ready and bed request placed in chart

## 2022-02-01 NOTE — ED Notes (Signed)
Pt given heated chicken broth and ice water per request

## 2022-02-01 NOTE — Consult Note (Addendum)
Consultation  Referring Provider: [facility]/ Bonner Puna Primary Care Physician:  [name], MD Primary Gastroenterologist:  Dr.Hung  Reason for Consultation:  GI bleed  HPI: Ruben [name] is a 76 y.o. male, known to Dr. Benson Norway.  Patient presented to the emergency room last night when brought in by his family complaining of feeling very weak.  His daughter was concerned because he refused to go to dialysis yesterday, normally dialyzes Monday Wednesday Friday. Patient relates that he had onset of black stools on Wednesday evening and was having at least a couple of bowel movements every day since, no overt red blood.  He has a couple of episodes of spitting up but not bringing up any blood.  He denies any abdominal pain currently though had seen Dr. Almyra Free apparently earlier this week as an outpatient with several month history of epigastric discomfort with eating.  Has been on omeprazole 40 mg once daily long-term, no medication changes were made. He has not had any further bowel movements overnight or this morning. Patient is on Eliquis, last dose last evening.  His daughter gave him all of his medications while they are waiting in the ER waiting room. Blood pressure this morning became soft, initial hemoglobin on arrival 8.8/hematocrit 25.8/MCV of 94 Platelets 128 BUN 59/creatinine 8.4 Last hemoglobin 11/29/2021 was 10.9/hematocrit 33.4  He is currently being transfused first of 2 units of packed RBCs.  Not had any prior history of GI bleeding, no prior Endo to their knowledge.  He did have flexible sigmoidoscopy in February 2023 per Dr. Benson Norway while he was hospitalized due to rectal bleeding.  Was found to have some rectal ulcerations in the rectosigmoid.  Biopsies showed focal erosions no chronic changes.  Patient has multiple comorbidities including end-stage renal disease on dialysis, hypertension, prior history of EtOH abuse, and active. He had an NSTEMI February 1497 complicated by  cardiogenic shock and acute congestive heart failure as well as atrial fibrillation.  He was started on Eliquis without admission. He has severe congestive heart failure with EF 25 to 30% and severe mitral regurg.   Past Medical History:  Diagnosis Date   Arthritis    Chronic kidney disease, stage 3b ([place])    ESRD (end stage renal disease) ([place])    ETOH abuse    History of stress test 09/02/2008   showed inferolateral scar without ischemia   Hx of echocardiogram 09/02/2008   was essentially normal   Hyperlipidemia    Hypertension    Myocardial infarction [facility])    Tobacco abuse     Past Surgical History:  Procedure Laterality Date   AV FISTULA PLACEMENT Left 05/27/2021   Procedure: LEFT ARTERIOVENOUS (AV) GRAFT CREATION;  Surgeon: Broadus John, MD;  Location: [place];  Service: Vascular;  Laterality: Left;  PERIPHERAL NERVE BLOCK   BACK SURGERY     Dr Luiz Ochoa involving L3-L4 discectomy.   BIOPSY  06/14/2021   Procedure: BIOPSY;  Surgeon: Carol Ada, MD;  Location: [facility] ENDOSCOPY;  Service: Endoscopy;;   CARDIOVERSION N/A 05/13/2021   Procedure: CARDIOVERSION;  Surgeon: Larey Dresser, MD;  Location: [facility] ENDOSCOPY;  Service: Cardiovascular;  Laterality: N/A;   FLEXIBLE SIGMOIDOSCOPY N/A 06/14/2021   Procedure: Beryle Quant;  Surgeon: Carol Ada, MD;  Location: [place];  Service: Endoscopy;  Laterality: N/A;   HERNIA REPAIR     IR FLUORO GUIDE CV LINE RIGHT  05/21/2021   IR US GUIDE VASC ACCESS RIGHT  05/21/2021   RIGHT/LEFT HEART CATH  AND CORONARY ANGIOGRAPHY N/A 05/09/2021   Procedure: RIGHT/LEFT HEART CATH AND CORONARY ANGIOGRAPHY;  Surgeon: Larey Dresser, MD;  Location: [place] CV LAB;  Service: Cardiovascular;  Laterality: N/A;   SPINE SURGERY     TEE WITHOUT CARDIOVERSION N/A 05/07/2021   Procedure: TRANSESOPHAGEAL ECHOCARDIOGRAM (TEE);  Surgeon: Larey Dresser, MD;  Location: [facility] ENDOSCOPY;  Service: Cardiovascular;  Laterality: N/A;   TEE WITHOUT  CARDIOVERSION N/A 05/13/2021   Procedure: TRANSESOPHAGEAL ECHOCARDIOGRAM (TEE);  Surgeon: Larey Dresser, MD;  Location: [facility] ENDOSCOPY;  Service: Cardiovascular;  Laterality: N/A;   TEE WITHOUT CARDIOVERSION N/A 10/31/2021   Procedure: TRANSESOPHAGEAL ECHOCARDIOGRAM (TEE);  Surgeon: Larey Dresser, MD;  Location: [facility] ENDOSCOPY;  Service: Cardiovascular;  Laterality: N/A;    Prior to Admission medications   Medication Sig Start Date End Date Taking? Authorizing Provider  acetaminophen (TYLENOL) 650 MG CR tablet Take 1,300 mg by mouth every 8 (eight) hours as needed for pain.   Yes [provider]  albuterol (PROVENTIL) (2.5 MG/3ML) 0.083% nebulizer solution Take 3 mLs (2.5 mg total) by nebulization every 4 (four) hours as needed for wheezing or shortness of breath. 04/03/21 04/03/22 Yes [name], FNP  ALPRAZolam Duanne Moron) 0.25 MG tablet Take 1 tablet (0.25 mg total) by mouth daily as needed for anxiety (anxiety with dialysis). 09/17/21 09/17/22 Yes Larey Dresser, MD  amiodarone (PACERONE) 200 MG tablet Take 1 tablet (200 mg total) by mouth daily. 11/11/21  Yes Larey Dresser, MD  apixaban (ELIQUIS) 5 MG TABS tablet Take 1 tablet (5 mg total) by mouth 2 (two) times daily. 08/26/21  Yes Milford, Maricela Bo, FNP  atorvastatin (LIPITOR) 80 MG tablet Take 1 tablet (80 mg total) by mouth daily. Patient taking differently: Take 80 mg by mouth at bedtime. 11/21/21  Yes Joette Catching, PA-C  cholecalciferol (VITAMIN D3) 25 MCG (1000 UNIT) tablet Take 1,000 Units by mouth in the morning.   Yes [provider]  ethyl chloride spray Apply 1 Application topically as needed (prior to dialysis).   Yes [provider]  gabapentin (NEURONTIN) 100 MG capsule Take 100 mg by mouth at bedtime. 11/14/21  Yes [provider]  hydrocortisone (ANUSOL-HC) 25 MG suppository Place 1 suppository (25 mg total) rectally 2 (two) times daily. Patient taking differently: Place 25 mg  rectally 2 (two) times daily as needed for hemorrhoids. 11/29/21  Yes Carlisle Cater, PA-C  hydrOXYzine (ATARAX) 10 MG tablet Take 10 mg by mouth at bedtime as needed for anxiety.   Yes [provider]  loratadine (CLARITIN) 10 MG tablet Take 10 mg by mouth every other day.   Yes [provider]  magnesium oxide (MAG-OX) 400 (240 Mg) MG tablet Take 400 mg by mouth daily in the afternoon. 07/05/21  Yes [provider]  melatonin 5 MG TABS Take 5-10 mg by mouth at bedtime as needed (sleep).   Yes [provider]  metoCLOPramide (REGLAN) 5 MG tablet Take 1 tablet (5 mg total) by mouth every 8 (eight) hours as needed for nausea. 07/12/21  Yes Couture, Cortni S, PA-C  midodrine (PROAMATINE) 10 MG tablet Take 1.5 tablets (15 mg total) by mouth 3 (three) times daily with meals. 11/21/21  Yes Joette Catching, PA-C  omeprazole (PRILOSEC) 40 MG capsule Take 40 mg by mouth daily before breakfast.   Yes [provider]  ondansetron (ZOFRAN) 4 MG tablet Take 1 tablet (4 mg total) by mouth daily as needed for nausea or vomiting. 06/04/21 06/04/22  Yes [name], FNP  phenylephrine (,USE FOR PREPARATION-H,) 0.25 % suppository Place 1 suppository rectally 2 (two) times daily as needed for hemorrhoids.   Yes [provider]  polyethylene glycol powder (MIRALAX) 17 GM/SCOOP powder Take 17 g by mouth daily. Make be given every other day as well   Yes [provider]  prochlorperazine (COMPAZINE) 5 MG tablet Take 5 mg by mouth 3 (three) times daily as needed for nausea or vomiting. 09/05/21  Yes [provider]  Propylene Glycol (SYSTANE COMPLETE) 0.6 % SOLN Place 1 drop into both eyes 3 (three) times daily as needed (dry/irritated eyes.).   Yes [provider]  sertraline (ZOLOFT) 25 MG tablet Take 25 mg by mouth in the morning. 10/23/21  Yes [provider]  nitroGLYCERIN (NITROSTAT) 0.4 MG SL tablet Place 1 tablet (0.4 mg total)  under the tongue every 5 (five) minutes as needed for chest pain. Patient not taking: Reported on 02/01/2022 05/28/21 05/28/22  Darrick Grinder D, NP  NONFORMULARY OR COMPOUNDED ITEM Place 1 Application rectally 2 (two) times daily as needed for hemorrhoids. Diltiazem 2% Ointment Patient not taking: Reported on 02/01/2022 11/29/21   [provider]  traZODone (DESYREL) 100 MG tablet Take 100 mg by mouth at bedtime as needed for sleep. Patient not taking: Reported on 02/01/2022 12/26/21   [provider]    Current Facility-Administered Medications  Medication Dose Route Frequency Provider Last Rate Last Admin   [START ON 02/04/2022] pantoprazole (PROTONIX) injection 40 mg  40 mg Intravenous Q12H Palumbo, April, MD       pantoprozole (PROTONIX) 80 mg /NS 100 mL infusion  8 mg/hr Intravenous Continuous Palumbo, April, MD 10 mL/hr at 02/01/22 0759 8 mg/hr at 02/01/22 0759   Current Outpatient Medications  Medication Sig Dispense Refill   acetaminophen (TYLENOL) 650 MG CR tablet Take 1,300 mg by mouth every 8 (eight) hours as needed for pain.     albuterol (PROVENTIL) (2.5 MG/3ML) 0.083% nebulizer solution Take 3 mLs (2.5 mg total) by nebulization every 4 (four) hours as needed for wheezing or shortness of breath. 75 mL 2   ALPRAZolam (XANAX) 0.25 MG tablet Take 1 tablet (0.25 mg total) by mouth daily as needed for anxiety (anxiety with dialysis). 30 tablet 1   amiodarone (PACERONE) 200 MG tablet Take 1 tablet (200 mg total) by mouth daily. 90 tablet 2   apixaban (ELIQUIS) 5 MG TABS tablet Take 1 tablet (5 mg total) by mouth 2 (two) times daily. 60 tablet 6   atorvastatin (LIPITOR) 80 MG tablet Take 1 tablet (80 mg total) by mouth daily. (Patient taking differently: Take 80 mg by mouth at bedtime.) 90 tablet 3   cholecalciferol (VITAMIN D3) 25 MCG (1000 UNIT) tablet Take 1,000 Units by mouth in the morning.     ethyl chloride spray Apply 1 Application topically as needed (prior to dialysis).      gabapentin (NEURONTIN) 100 MG capsule Take 100 mg by mouth at bedtime.     hydrocortisone (ANUSOL-HC) 25 MG suppository Place 1 suppository (25 mg total) rectally 2 (two) times daily. (Patient taking differently: Place 25 mg rectally 2 (two) times daily as needed for hemorrhoids.) 12 suppository 0   hydrOXYzine (ATARAX) 10 MG tablet Take 10 mg by mouth at bedtime as needed for anxiety.     loratadine (CLARITIN) 10 MG tablet Take 10 mg by mouth every other day.     magnesium oxide (MAG-OX) 400 (240 Mg) MG tablet Take 400 mg  by mouth daily in the afternoon.     melatonin 5 MG TABS Take 5-10 mg by mouth at bedtime as needed (sleep).     metoCLOPramide (REGLAN) 5 MG tablet Take 1 tablet (5 mg total) by mouth every 8 (eight) hours as needed for nausea. 6 tablet 0   midodrine (PROAMATINE) 10 MG tablet Take 1.5 tablets (15 mg total) by mouth 3 (three) times daily with meals. 150 tablet 6   omeprazole (PRILOSEC) 40 MG capsule Take 40 mg by mouth daily before breakfast.     ondansetron (ZOFRAN) 4 MG tablet Take 1 tablet (4 mg total) by mouth daily as needed for nausea or vomiting. 30 tablet 1   phenylephrine (,USE FOR PREPARATION-H,) 0.25 % suppository Place 1 suppository rectally 2 (two) times daily as needed for hemorrhoids.     polyethylene glycol powder (MIRALAX) 17 GM/SCOOP powder Take 17 g by mouth daily. Make be given every other day as well     prochlorperazine (COMPAZINE) 5 MG tablet Take 5 mg by mouth 3 (three) times daily as needed for nausea or vomiting.     Propylene Glycol (SYSTANE COMPLETE) 0.6 % SOLN Place 1 drop into both eyes 3 (three) times daily as needed (dry/irritated eyes.).     sertraline (ZOLOFT) 25 MG tablet Take 25 mg by mouth in the morning.     nitroGLYCERIN (NITROSTAT) 0.4 MG SL tablet Place 1 tablet (0.4 mg total) under the tongue every 5 (five) minutes as needed for chest pain. (Patient not taking: Reported on 02/01/2022) 100 tablet 3   NONFORMULARY OR COMPOUNDED ITEM Place 1  Application rectally 2 (two) times daily as needed for hemorrhoids. Diltiazem 2% Ointment (Patient not taking: Reported on 02/01/2022)     traZODone (DESYREL) 100 MG tablet Take 100 mg by mouth at bedtime as needed for sleep. (Patient not taking: Reported on 02/01/2022)      Allergies as of 01/31/2022 - Review Complete 01/31/2022  Allergen Reaction Noted   Other Other (See Comments) 04/29/2021    Family History  Problem Relation Age of Onset   Heart attack Brother    Diabetes Brother    Heart disease Brother    Cancer Father    Diabetes Brother    Heart disease Brother     Social History   Socioeconomic History   Marital status: Single    Spouse name: Not on file   Number of children: Not on file   Years of education: Not on file   Highest education level: Not on file  Occupational History   Not on file  Tobacco Use   Smoking status: Former    Packs/day: 1.50    Years: 20.00    Total pack years: 30.00    Types: Cigarettes    Quit date: 04/28/2021    Years since quitting: 0.7   Smokeless tobacco: Never   Tobacco comments:    down to 2 packs per week; 3/21 - down to 1 Pack per week  Vaping Use   Vaping Use: Never used  Substance and Sexual Activity   Alcohol use: No    Alcohol/week: 0.0 standard drinks of alcohol   Drug use: No   Sexual activity: Not on file  Other Topics Concern   Not on file  Social History Narrative   Caffiene- rare.    Education: HS   Retired: Administrator.    Social Determinants of Health   Financial Resource Strain: Medium Risk (05/02/2021)   Overall Financial Resource Strain (  CARDIA)    Difficulty of Paying Living Expenses: Somewhat hard  Food Insecurity: No Food Insecurity (05/02/2021)   Hunger Vital Sign    Worried About Running Out of Food in the Last Year: Never true    Ran Out of Food in the Last Year: Never true  Transportation Needs: No Transportation Needs (05/02/2021)   PRAPARE - Hydrologist  (Medical): No    Lack of Transportation (Non-Medical): No  Physical Activity: Not on file  Stress: Not on file  Social Connections: Not on file  Intimate Partner Violence: Not on file    Review of Systems: Pertinent positive and negative review of systems were noted in the above HPI section.  All other review of systems was otherwise negative.   Physical Exam: Vital signs in last 24 hours: Temp:  [98.2 F (36.8 C)-98.7 F (37.1 C)] 98.2 F (36.8 C) (09/30 0810) Pulse Rate:  [73-82] 76 (09/30 0810) Resp:  [15-24] 18 (09/30 0810) BP: (94-114)/(51-71) 107/65 (09/30 0810) SpO2:  [92 %-97 %] 97 % (09/30 0810)   General:   Alert,  Well-developed, elderly African-American male pleasant and cooperative in NAD Head:  Normocephalic and atraumatic. Eyes:  Sclera clear, no icterus.   Conjunctiva pale Ears:  Normal auditory acuity. Nose:  No deformity, discharge,  or lesions. Mouth:  No deformity or lesions.   Neck:  Supple; no masses or thyromegaly. Lungs:  Clear throughout to auscultation.   No wheezes, crackles, or rhonchi. Heart:  Regular rate and rhythm; syst murmur Abdomen:  Soft,nontender, BS active,nonpalp mass or hsm.   Rectal:  not done Msk:  Symmetrical without gross deformities. . Pulses:  Normal pulses noted. Extremities:  Without clubbing or edema. Neurologic:  Alert and  oriented x4;  grossly normal neurologically. Skin:  Intact without significant lesions or rashes.. Psych:  Alert and cooperative. Normal mood and affect.  Intake/Output from previous day: No intake/output data recorded. Intake/Output this shift: No intake/output data recorded.  Lab Results: Recent Labs    01/31/22 2151 01/31/22 2352  WBC 5.6  --   HGB 8.8* 8.8*  HCT 25.8* 26.0*  PLT 128*  --    BMET Recent Labs    01/31/22 2151 01/31/22 2352  NA 138 135  K 5.9* 5.7*  CL 94* 95*  CO2 27  --   GLUCOSE 104* 93  BUN 64* 59*  CREATININE 7.78* 8.40*  CALCIUM 10.0  --    LFT Recent Labs     01/31/22 2151  PROT 6.2*  ALBUMIN 3.0*  AST 43*  ALT 47*  ALKPHOS 68  BILITOT 1.8*   PT/INR No results for input(s): "LABPROT", "INR" in the last 72 hours. Hepatitis Panel No results for input(s): "HEPBSAG", "HCVAB", "HEPAIGM", "HEPBIGM" in the last 72 hours.    IMPRESSION:  #74 76 year old African-American male presenting with 4-day history of melena at home associated with progressive weakness. Eating in setting of chronic anticoagulation with Eliquis, last dose last evening  No prior history of GI bleeding Has had complaints of epigastric discomfort with meals over the past several months, on chronic omeprazole.  Baseline hemoglobin around 11, down 2 g on presentation last evening.  Etiology of bleeding is not clear, suspect upper GI source.  Rule out peptic ulcer disease, erosive gastropathy, occult malignancy  #2-chronic anticoagulation-on Eliquis #3 severe congestive heart failure EF 25 to 30% #4 severe mitral regurgitation #5 coronary artery disease status post NSTEMI February 2022 associated with cardiogenic shock and atrial  fibrillation #6 end-stage renal disease on dialysis normally dialyzes Monday Wednesday Friday  #7 previous history of EtOH abuse     PLAN: Keep n.p.o. PPI infusion Transfused 2 units of packed RBCs this morning, then transfuse to keep hemoglobin in the 8-9 range due to multiple co-morbidities Hold Eliquis   I discussed EGD with patient and family at bedside.  He will need EGD at some point over the next 24 hours, perhaps later today after he has been transfused. Procedure  discussed in detail with the patient including indications risks and benefits and he is agreeable to proceed.  GI will follow with you   Ruben Esterwood  PA-C9/30/2023, 8:26 AM   I have taken an interval history, thoroughly reviewed the chart and examined the patient. I agree with the Advanced Practitioner's note, impression and recommendations, and have recorded  additional findings, impressions and recommendations below. I performed a substantive portion of this encounter (>50% time spent), including a complete performance of the medical decision making.  My additional thoughts are as follows:  76 year old dialysis patient with severe CHF and mitral regurgitation on [place] (last Eliquis dose last evening) here with melena and acute on chronic anemia. Currently blood pressure and heart rate normal and stable.  He is hyperkalemic and missed dialysis the last few days.  A unit of PRBCs has been transfused and another is planned, possibly during dialysis.  He needs an upper endoscopy, but he should wait until tomorrow morning for further transfusion, dialysis to normalize his potassium and regulate volume status, more time for Eliquis washout and for pantoprazole to help with intrinsic coagulation.  Last INR was 1.8 in January of this year (?  On Coumadin at the time) -PT/INR today pending  I discussed an upper endoscopy with him while his son was at the bedside.  Procedure described along with risks and benefits in detail and he was agreeable.  The benefits and risks of the planned procedure were described in detail with the patient or (when appropriate) their health care proxy.  Risks were outlined as including, but not limited to, bleeding, infection, perforation, adverse medication reaction leading to cardiac or pulmonary decompensation, pancreatitis (if ERCP).  The limitation of incomplete mucosal visualization was also discussed.  No guarantees or warranties were given.  Patient at increased risk for cardiopulmonary complications of procedure due to medical comorbidities. (Age, anticoagulation, end-stage renal disease on dialysis, CHF)  Ruben [name] Office:[phone]

## 2022-02-01 NOTE — Progress Notes (Signed)
Received patient in bed to unit.  Alert and oriented.  Informed consent signed and in chart.   Treatment initiated: 1530 Treatment completed: 1906  Patient tolerated well.  Transported back to the room  Alert, without acute distress.  Hand-off given to patient's nurse.   Access used: [employer] Access issues: pt had to be cannulated x3.  Total UF removed: 1.4L Medication(s) given: Aranesp, Vitk  Post HD VS: 121/60,79,97%,23, Post HD weight: unable to weigh on ed gurney.   [facility]

## 2022-02-01 NOTE — Consult Note (Signed)
[facility] Renal Consultation Note    Indication for Consultation:  Management of ESRD/hemodialysis, anemia, hypertension/volume, and secondary hyperparathyroidism. PCP:  HPI: Ruben [name] is a 76 y.o. male with ESRD, HTN, A-fib (on Eliquis) who was admitted for GIB.  Presented to ED last night with concern of weakness and dark stools for several days. Felt too poorly to go to dialysis yesterday. Denied having fever, chills, CP, dyspnea. Has had some vague abdominal pain. In ED, he was slightly hypotensive SBP 90-105, afebrile. Labs with K 5.9, CO2 27, Ca 10, Alb 3, ^ LFTs, WBC 5.6, Hgb 8.8. FOBT +. He was started on IV pantoprazole, Eliquis held and admitted. GI consulted.  Dialyzes on MWF schedule at [facility]. He missed HD yesterday d/t weakness, last full HD was on 9/27. Reviewed outpatient labs: Hgb has been slowly trending down the past 3 weeks. Hgb 12.6 on 9/13, 11.2 on 9/20, 10.4 on 9/27. Uses LUE AVG as his access.  Past Medical History:  Diagnosis Date   Arthritis    Chronic kidney disease, stage 3b ([place])    ESRD (end stage renal disease) ([place])    ETOH abuse    History of stress test 09/02/2008   showed inferolateral scar without ischemia   Hx of echocardiogram 09/02/2008   was essentially normal   Hyperlipidemia    Hypertension    Myocardial infarction [facility])    Tobacco abuse    Past Surgical History:  Procedure Laterality Date   AV FISTULA PLACEMENT Left 05/27/2021   Procedure: LEFT ARTERIOVENOUS (AV) GRAFT CREATION;  Surgeon: Broadus John, MD;  Location: [place];  Service: Vascular;  Laterality: Left;  PERIPHERAL NERVE BLOCK   BACK SURGERY     Dr Luiz Ochoa involving L3-L4 discectomy.   BIOPSY  06/14/2021   Procedure: BIOPSY;  Surgeon: Carol Ada, MD;  Location: [facility] ENDOSCOPY;  Service: Endoscopy;;   CARDIOVERSION N/A 05/13/2021   Procedure: CARDIOVERSION;  Surgeon: Larey Dresser, MD;  Location: [facility] ENDOSCOPY;  Service: Cardiovascular;   Laterality: N/A;   FLEXIBLE SIGMOIDOSCOPY N/A 06/14/2021   Procedure: Beryle Quant;  Surgeon: Carol Ada, MD;  Location: [address];  Service: Endoscopy;  Laterality: N/A;   HERNIA REPAIR     IR FLUORO GUIDE CV LINE RIGHT  05/21/2021   IR US GUIDE VASC ACCESS RIGHT  05/21/2021   RIGHT/LEFT HEART CATH AND CORONARY ANGIOGRAPHY N/A 05/09/2021   Procedure: RIGHT/LEFT HEART CATH AND CORONARY ANGIOGRAPHY;  Surgeon: Larey Dresser, MD;  Location: [place] CV LAB;  Service: Cardiovascular;  Laterality: N/A;   SPINE SURGERY     TEE WITHOUT CARDIOVERSION N/A 05/07/2021   Procedure: TRANSESOPHAGEAL ECHOCARDIOGRAM (TEE);  Surgeon: Larey Dresser, MD;  Location: [facility] ENDOSCOPY;  Service: Cardiovascular;  Laterality: N/A;   TEE WITHOUT CARDIOVERSION N/A 05/13/2021   Procedure: TRANSESOPHAGEAL ECHOCARDIOGRAM (TEE);  Surgeon: Larey Dresser, MD;  Location: [facility] ENDOSCOPY;  Service: Cardiovascular;  Laterality: N/A;   TEE WITHOUT CARDIOVERSION N/A 10/31/2021   Procedure: TRANSESOPHAGEAL ECHOCARDIOGRAM (TEE);  Surgeon: Larey Dresser, MD;  Location: [facility] ENDOSCOPY;  Service: Cardiovascular;  Laterality: N/A;   Family History  Problem Relation Age of Onset   Heart attack Brother    Diabetes Brother    Heart disease Brother    Cancer Father    Diabetes Brother    Heart disease Brother    Social History:  reports that he quit smoking about 9 months ago. His smoking use included cigarettes. He has a 30.00 pack-year smoking history.  He has never used smokeless tobacco. He reports that he does not drink alcohol and does not use drugs.  ROS: As per HPI otherwise negative.  Physical Exam: Vitals:   02/01/22 0815 02/01/22 0900 02/01/22 1000 02/01/22 1030  BP: 107/65 (!) 102/50 106/63 109/61  Pulse: 77 77 80 80  Resp: 17 19 (!) 25 (!) 21  Temp:      TempSrc:      SpO2: 93% 92% 92% 93%     General: Well developed, well nourished, in no acute distress. Room air, son at bedside. Head:  Normocephalic, atraumatic, sclera non-icteric, mucus membranes are moist. Neck: Supple without lymphadenopathy/masses. JVD not elevated. Lungs: Clear bilaterally to auscultation without wheezes, rales, or rhonchi. Breathing is unlabored. Heart: RRR with normal S1, S2. No murmurs, rubs, or gallops appreciated. Abdomen: Soft, mild generalized tenderness without guarding Musculoskeletal:  Strength and tone appear normal for age. Lower extremities: No edema or ischemic changes, no open wounds. Neuro: Alert and oriented X 3. Moves all extremities spontaneously. Psych:  Responds to questions appropriately with a normal affect. Dialysis Access: LUE AVG +bruit  Allergies  Allergen Reactions   Other Other (See Comments)    Pollen - sneezing, itching/watery eyes   Prior to Admission medications   Medication Sig Start Date End Date Taking? Authorizing Provider  acetaminophen (TYLENOL) 650 MG CR tablet Take 1,300 mg by mouth every 8 (eight) hours as needed for pain.   Yes [provider]  albuterol (PROVENTIL) (2.5 MG/3ML) 0.083% nebulizer solution Take 3 mLs (2.5 mg total) by nebulization every 4 (four) hours as needed for wheezing or shortness of breath. 04/03/21 04/03/22 Yes [name], FNP  ALPRAZolam Duanne Moron) 0.25 MG tablet Take 1 tablet (0.25 mg total) by mouth daily as needed for anxiety (anxiety with dialysis). 09/17/21 09/17/22 Yes Larey Dresser, MD  amiodarone (PACERONE) 200 MG tablet Take 1 tablet (200 mg total) by mouth daily. 11/11/21  Yes Larey Dresser, MD  apixaban (ELIQUIS) 5 MG TABS tablet Take 1 tablet (5 mg total) by mouth 2 (two) times daily. 08/26/21  Yes Milford, Maricela Bo, FNP  atorvastatin (LIPITOR) 80 MG tablet Take 1 tablet (80 mg total) by mouth daily. Patient taking differently: Take 80 mg by mouth at bedtime. 11/21/21  Yes Joette Catching, PA-C  cholecalciferol (VITAMIN D3) 25 MCG (1000 UNIT) tablet Take 1,000 Units by mouth in the morning.   Yes [provider]  ethyl chloride spray Apply 1 Application topically as needed (prior to dialysis).   Yes [provider]  gabapentin (NEURONTIN) 100 MG capsule Take 100 mg by mouth at bedtime. 11/14/21  Yes [provider]  hydrocortisone (ANUSOL-HC) 25 MG suppository Place 1 suppository (25 mg total) rectally 2 (two) times daily. Patient taking differently: Place 25 mg rectally 2 (two) times daily as needed for hemorrhoids. 11/29/21  Yes Carlisle Cater, PA-C  hydrOXYzine (ATARAX) 10 MG tablet Take 10 mg by mouth at bedtime as needed for anxiety.   Yes [provider]  loratadine (CLARITIN) 10 MG tablet Take 10 mg by mouth every other day.   Yes [provider]  magnesium oxide (MAG-OX) 400 (240 Mg) MG tablet Take 400 mg by mouth daily in the afternoon. 07/05/21  Yes [provider]  melatonin 5 MG TABS Take 5-10 mg by mouth at bedtime as needed (sleep).   Yes [provider]  metoCLOPramide (REGLAN) 5 MG tablet Take 1 tablet (5 mg total) by mouth every 8 (eight)  hours as needed for nausea. 07/12/21  Yes Couture, Cortni S, PA-C  midodrine (PROAMATINE) 10 MG tablet Take 1.5 tablets (15 mg total) by mouth 3 (three) times daily with meals. 11/21/21  Yes Joette Catching, PA-C  omeprazole (PRILOSEC) 40 MG capsule Take 40 mg by mouth daily before breakfast.   Yes [provider]  ondansetron (ZOFRAN) 4 MG tablet Take 1 tablet (4 mg total) by mouth daily as needed for nausea or vomiting. 06/04/21 06/04/22 Yes [name], FNP  phenylephrine (,USE FOR PREPARATION-H,) 0.25 % suppository Place 1 suppository rectally 2 (two) times daily as needed for hemorrhoids.   Yes [provider]  polyethylene glycol powder (MIRALAX) 17 GM/SCOOP powder Take 17 g by mouth daily. Make be given every other day as well   Yes [provider]  prochlorperazine (COMPAZINE) 5 MG tablet Take 5 mg by mouth 3 (three) times daily as needed for nausea or  vomiting. 09/05/21  Yes [provider]  Propylene Glycol (SYSTANE COMPLETE) 0.6 % SOLN Place 1 drop into both eyes 3 (three) times daily as needed (dry/irritated eyes.).   Yes [provider]  sertraline (ZOLOFT) 25 MG tablet Take 25 mg by mouth in the morning. 10/23/21  Yes [provider]  nitroGLYCERIN (NITROSTAT) 0.4 MG SL tablet Place 1 tablet (0.4 mg total) under the tongue every 5 (five) minutes as needed for chest pain. Patient not taking: Reported on 02/01/2022 05/28/21 05/28/22  Darrick Grinder D, NP  NONFORMULARY OR COMPOUNDED ITEM Place 1 Application rectally 2 (two) times daily as needed for hemorrhoids. Diltiazem 2% Ointment Patient not taking: Reported on 02/01/2022 11/29/21   [provider]  traZODone (DESYREL) 100 MG tablet Take 100 mg by mouth at bedtime as needed for sleep. Patient not taking: Reported on 02/01/2022 12/26/21   [provider]   Current Facility-Administered Medications  Medication Dose Route Frequency Provider Last Rate Last Admin   calcium gluconate 1 g/ 50 mL sodium chloride IVPB  1 g Intravenous Once Patrecia Pour, MD 50 mL/hr at 02/01/22 1037 1,000 mg at 02/01/22 1037   midodrine (PROAMATINE) tablet 15 mg  15 mg Oral TID WC Patrecia Pour, MD       [START ON 02/04/2022] pantoprazole (PROTONIX) injection 40 mg  40 mg Intravenous Q12H Palumbo, April, MD       pantoprozole (PROTONIX) 80 mg /NS 100 mL infusion  8 mg/hr Intravenous Continuous Palumbo, April, MD 10 mL/hr at 02/01/22 0759 8 mg/hr at 02/01/22 0759   Current Outpatient Medications  Medication Sig Dispense Refill   acetaminophen (TYLENOL) 650 MG CR tablet Take 1,300 mg by mouth every 8 (eight) hours as needed for pain.     albuterol (PROVENTIL) (2.5 MG/3ML) 0.083% nebulizer solution Take 3 mLs (2.5 mg total) by nebulization every 4 (four) hours as needed for wheezing or shortness of breath. 75 mL 2   ALPRAZolam (XANAX) 0.25 MG tablet Take 1 tablet (0.25 mg total) by  mouth daily as needed for anxiety (anxiety with dialysis). 30 tablet 1   amiodarone (PACERONE) 200 MG tablet Take 1 tablet (200 mg total) by mouth daily. 90 tablet 2   apixaban (ELIQUIS) 5 MG TABS tablet Take 1 tablet (5 mg total) by mouth 2 (two) times daily. 60 tablet 6   atorvastatin (LIPITOR) 80 MG tablet Take 1 tablet (80 mg total) by mouth daily. (Patient taking differently: Take 80 mg by mouth at bedtime.) 90 tablet 3   cholecalciferol (VITAMIN D3) 25  MCG (1000 UNIT) tablet Take 1,000 Units by mouth in the morning.     ethyl chloride spray Apply 1 Application topically as needed (prior to dialysis).     gabapentin (NEURONTIN) 100 MG capsule Take 100 mg by mouth at bedtime.     hydrocortisone (ANUSOL-HC) 25 MG suppository Place 1 suppository (25 mg total) rectally 2 (two) times daily. (Patient taking differently: Place 25 mg rectally 2 (two) times daily as needed for hemorrhoids.) 12 suppository 0   hydrOXYzine (ATARAX) 10 MG tablet Take 10 mg by mouth at bedtime as needed for anxiety.     loratadine (CLARITIN) 10 MG tablet Take 10 mg by mouth every other day.     magnesium oxide (MAG-OX) 400 (240 Mg) MG tablet Take 400 mg by mouth daily in the afternoon.     melatonin 5 MG TABS Take 5-10 mg by mouth at bedtime as needed (sleep).     metoCLOPramide (REGLAN) 5 MG tablet Take 1 tablet (5 mg total) by mouth every 8 (eight) hours as needed for nausea. 6 tablet 0   midodrine (PROAMATINE) 10 MG tablet Take 1.5 tablets (15 mg total) by mouth 3 (three) times daily with meals. 150 tablet 6   omeprazole (PRILOSEC) 40 MG capsule Take 40 mg by mouth daily before breakfast.     ondansetron (ZOFRAN) 4 MG tablet Take 1 tablet (4 mg total) by mouth daily as needed for nausea or vomiting. 30 tablet 1   phenylephrine (,USE FOR PREPARATION-H,) 0.25 % suppository Place 1 suppository rectally 2 (two) times daily as needed for hemorrhoids.     polyethylene glycol powder (MIRALAX) 17 GM/SCOOP powder Take 17 g by  mouth daily. Make be given every other day as well     prochlorperazine (COMPAZINE) 5 MG tablet Take 5 mg by mouth 3 (three) times daily as needed for nausea or vomiting.     Propylene Glycol (SYSTANE COMPLETE) 0.6 % SOLN Place 1 drop into both eyes 3 (three) times daily as needed (dry/irritated eyes.).     sertraline (ZOLOFT) 25 MG tablet Take 25 mg by mouth in the morning.     nitroGLYCERIN (NITROSTAT) 0.4 MG SL tablet Place 1 tablet (0.4 mg total) under the tongue every 5 (five) minutes as needed for chest pain. (Patient not taking: Reported on 02/01/2022) 100 tablet 3   NONFORMULARY OR COMPOUNDED ITEM Place 1 Application rectally 2 (two) times daily as needed for hemorrhoids. Diltiazem 2% Ointment (Patient not taking: Reported on 02/01/2022)     traZODone (DESYREL) 100 MG tablet Take 100 mg by mouth at bedtime as needed for sleep. (Patient not taking: Reported on 02/01/2022)     Labs: Basic Metabolic Panel: Recent Labs  Lab 01/31/22 2151 01/31/22 2352  NA 138 135  K 5.9* 5.7*  CL 94* 95*  CO2 27  --   GLUCOSE 104* 93  BUN 64* 59*  CREATININE 7.78* 8.40*  CALCIUM 10.0  --    Liver Function Tests: Recent Labs  Lab 01/31/22 2151  AST 43*  ALT 47*  ALKPHOS 68  BILITOT 1.8*  PROT 6.2*  ALBUMIN 3.0*   CBC: Recent Labs  Lab 01/31/22 2151 01/31/22 2352  WBC 5.6  --   HGB 8.8* 8.8*  HCT 25.8* 26.0*  MCV 94.9  --   PLT 128*  --    Dialysis Orders:  MWF at [facility]  3:45hr, 400/800, EDW 700.5kg, 2K/2Ca, UFP #2, LUE AVG, no heparin - Mircera 98mg IV q 4 week - just  reordered, last got Mircera 33mg on 12/16/21. - Hgb 12.6 on 9/13, 11.2 on 9/20, 10.4 on 9/27 per outpatient lab records  Assessment/Plan:  GI Bleed: FOBT +, dark stools for several days. GI consulted, for EGD in near future per notes.  ESRD: Usual MWF schedule, missed HD yesterday - mild hyperK today. HD today to correct.  Hypertension/volume: BP low sided, no edema on exam - minimal UF goal.  Anemia of ESRD + ABLA:  Hgb 8.8, will give dose Aranesp today. Transfused 1U PRBC.  Metabolic bone disease: CorrCa slightly high, Phos pending. No VDRA as outpatient, follow.  Nutrition:  Alb low, will add supps when allowed to eat.  A-fib: Eliquis on hold.  KVeneta Penton PA-C 02/01/2022, 11:22 AM  [employer]

## 2022-02-01 NOTE — H&P (Signed)
History and Physical    Patient: Ruben [name] ONG:[id number] DOB: December 17, 1945 DOA: 01/31/2022 DOS: the patient was seen and examined on 02/01/2022 PCP: [name], MD  Patient coming from: Home  Chief Complaint:  Chief Complaint  Patient presents with   GI Bleeding   HPI: Ruben [name] is a 76 y.o. male with medical history of ESRD, chronic HFrEF, PAF, EtOH and tobacco abuse, HTN, HLD, MR not a candidate for mitraclip, depression who presented to the ED yesterday evening with a few days of new onset dark tarry stools in the setting of crampy abdominal discomfort, early satiety which has continued intermittently since that time. Last dark stool was not liquid nor hard yesterday afternoon he believes, none since arrival. He has had no chest pain, dyspnea, palpitations. He has no nausea or vomiting. No other bleeding/bruising. He is oriented but a limited historian.   In the ED BP soft at 104/71, HR 78, afebrile, with +FOBT, hgb down to 8.8g/dl from 12.6 in June, 10.9 in July. Platelets 128k (baseline 140-150's). Metabolic profile concerning for hyperkalemia (K 5.9) and mild LFT elevations (AST 43, ALT 47, TBili 1.8)  Review of Systems: As mentioned in the history of present illness. All other systems reviewed and are negative. Past Medical History:  Diagnosis Date   Arthritis    Chronic kidney disease, stage 3b ([place])    ESRD (end stage renal disease) ([place])    ETOH abuse    History of stress test 09/02/2008   showed inferolateral scar without ischemia   Hx of echocardiogram 09/02/2008   was essentially normal   Hyperlipidemia    Hypertension    Myocardial infarction [facility])    Tobacco abuse    Past Surgical History:  Procedure Laterality Date   AV FISTULA PLACEMENT Left 05/27/2021   Procedure: LEFT ARTERIOVENOUS (AV) GRAFT CREATION;  Surgeon: Broadus John, MD;  Location: [place];  Service: Vascular;  Laterality: Left;  PERIPHERAL NERVE BLOCK   BACK SURGERY      Dr Luiz Ochoa involving L3-L4 discectomy.   BIOPSY  06/14/2021   Procedure: BIOPSY;  Surgeon: Carol Ada, MD;  Location: [facility] ENDOSCOPY;  Service: Endoscopy;;   CARDIOVERSION N/A 05/13/2021   Procedure: CARDIOVERSION;  Surgeon: Larey Dresser, MD;  Location: [facility] ENDOSCOPY;  Service: Cardiovascular;  Laterality: N/A;   FLEXIBLE SIGMOIDOSCOPY N/A 06/14/2021   Procedure: Beryle Quant;  Surgeon: Carol Ada, MD;  Location: [place];  Service: Endoscopy;  Laterality: N/A;   HERNIA REPAIR     IR FLUORO GUIDE CV LINE RIGHT  05/21/2021   IR US GUIDE VASC ACCESS RIGHT  05/21/2021   RIGHT/LEFT HEART CATH AND CORONARY ANGIOGRAPHY N/A 05/09/2021   Procedure: RIGHT/LEFT HEART CATH AND CORONARY ANGIOGRAPHY;  Surgeon: Larey Dresser, MD;  Location: [place] CV LAB;  Service: Cardiovascular;  Laterality: N/A;   SPINE SURGERY     TEE WITHOUT CARDIOVERSION N/A 05/07/2021   Procedure: TRANSESOPHAGEAL ECHOCARDIOGRAM (TEE);  Surgeon: Larey Dresser, MD;  Location: [facility] ENDOSCOPY;  Service: Cardiovascular;  Laterality: N/A;   TEE WITHOUT CARDIOVERSION N/A 05/13/2021   Procedure: TRANSESOPHAGEAL ECHOCARDIOGRAM (TEE);  Surgeon: Larey Dresser, MD;  Location: [facility] ENDOSCOPY;  Service: Cardiovascular;  Laterality: N/A;   TEE WITHOUT CARDIOVERSION N/A 10/31/2021   Procedure: TRANSESOPHAGEAL ECHOCARDIOGRAM (TEE);  Surgeon: Larey Dresser, MD;  Location: [facility] ENDOSCOPY;  Service: Cardiovascular;  Laterality: N/A;   Social History:  reports that he quit smoking about 9 months ago. His smoking use included cigarettes. He  has a 30.00 pack-year smoking history. He has never used smokeless tobacco. He reports that he does not drink alcohol and does not use drugs.  Allergies  Allergen Reactions   Other Other (See Comments)    Pollen - sneezing, itching/watery eyes    Family History  Problem Relation Age of Onset   Heart attack Brother    Diabetes Brother    Heart disease Brother    Cancer Father    Diabetes  Brother    Heart disease Brother     Prior to Admission medications   Medication Sig Start Date End Date Taking? Authorizing Provider  acetaminophen (TYLENOL) 650 MG CR tablet Take 1,300 mg by mouth every 8 (eight) hours as needed for pain.   Yes [provider]  albuterol (PROVENTIL) (2.5 MG/3ML) 0.083% nebulizer solution Take 3 mLs (2.5 mg total) by nebulization every 4 (four) hours as needed for wheezing or shortness of breath. 04/03/21 04/03/22 Yes [name], FNP  ALPRAZolam Duanne Moron) 0.25 MG tablet Take 1 tablet (0.25 mg total) by mouth daily as needed for anxiety (anxiety with dialysis). 09/17/21 09/17/22 Yes Larey Dresser, MD  amiodarone (PACERONE) 200 MG tablet Take 1 tablet (200 mg total) by mouth daily. 11/11/21  Yes Larey Dresser, MD  apixaban (ELIQUIS) 5 MG TABS tablet Take 1 tablet (5 mg total) by mouth 2 (two) times daily. 08/26/21  Yes Milford, Maricela Bo, FNP  atorvastatin (LIPITOR) 80 MG tablet Take 1 tablet (80 mg total) by mouth daily. Patient taking differently: Take 80 mg by mouth at bedtime. 11/21/21  Yes Joette Catching, PA-C  cholecalciferol (VITAMIN D3) 25 MCG (1000 UNIT) tablet Take 1,000 Units by mouth in the morning.   Yes [provider]  ethyl chloride spray Apply 1 Application topically as needed (prior to dialysis).   Yes [provider]  gabapentin (NEURONTIN) 100 MG capsule Take 100 mg by mouth at bedtime. 11/14/21  Yes [provider]  hydrocortisone (ANUSOL-HC) 25 MG suppository Place 1 suppository (25 mg total) rectally 2 (two) times daily. Patient taking differently: Place 25 mg rectally 2 (two) times daily as needed for hemorrhoids. 11/29/21  Yes Carlisle Cater, PA-C  hydrOXYzine (ATARAX) 10 MG tablet Take 10 mg by mouth at bedtime as needed for anxiety.   Yes [provider]  loratadine (CLARITIN) 10 MG tablet Take 10 mg by mouth every other day.   Yes [provider]  magnesium oxide (MAG-OX) 400  (240 Mg) MG tablet Take 400 mg by mouth daily in the afternoon. 07/05/21  Yes [provider]  melatonin 5 MG TABS Take 5-10 mg by mouth at bedtime as needed (sleep).   Yes [provider]  metoCLOPramide (REGLAN) 5 MG tablet Take 1 tablet (5 mg total) by mouth every 8 (eight) hours as needed for nausea. 07/12/21  Yes Couture, Cortni S, PA-C  midodrine (PROAMATINE) 10 MG tablet Take 1.5 tablets (15 mg total) by mouth 3 (three) times daily with meals. 11/21/21  Yes Joette Catching, PA-C  omeprazole (PRILOSEC) 40 MG capsule Take 40 mg by mouth daily before breakfast.   Yes [provider]  ondansetron (ZOFRAN) 4 MG tablet Take 1 tablet (4 mg total) by mouth daily as needed for nausea or vomiting. 06/04/21 06/04/22 Yes [name], FNP  phenylephrine (,USE FOR PREPARATION-H,) 0.25 % suppository Place 1 suppository rectally 2 (two) times daily as needed for hemorrhoids.   Yes [provider]  polyethylene glycol powder (MIRALAX) 17 GM/SCOOP  powder Take 17 g by mouth daily. Make be given every other day as well   Yes [provider]  prochlorperazine (COMPAZINE) 5 MG tablet Take 5 mg by mouth 3 (three) times daily as needed for nausea or vomiting. 09/05/21  Yes [provider]  Propylene Glycol (SYSTANE COMPLETE) 0.6 % SOLN Place 1 drop into both eyes 3 (three) times daily as needed (dry/irritated eyes.).   Yes [provider]  sertraline (ZOLOFT) 25 MG tablet Take 25 mg by mouth in the morning. 10/23/21  Yes [provider]  nitroGLYCERIN (NITROSTAT) 0.4 MG SL tablet Place 1 tablet (0.4 mg total) under the tongue every 5 (five) minutes as needed for chest pain. Patient not taking: Reported on 02/01/2022 05/28/21 05/28/22  Darrick Grinder D, NP  NONFORMULARY OR COMPOUNDED ITEM Place 1 Application rectally 2 (two) times daily as needed for hemorrhoids. Diltiazem 2% Ointment Patient not taking: Reported on 02/01/2022 11/29/21   [provider]  traZODone (DESYREL) 100 MG tablet Take 100 mg by mouth at bedtime as needed for sleep. Patient not taking: Reported on 02/01/2022 12/26/21   [provider]    Physical Exam: Vitals:   02/01/22 0753 02/01/22 0800 02/01/22 0810 02/01/22 0815  BP: (!) 101/51 (!) 95/53 107/65 107/65  Pulse: 77 75 76 77  Resp: '16 17 18 17  '$ Temp: 98.3 F (36.8 C)  98.2 F (36.8 C)   TempSrc: Oral  Oral   SpO2: 92% 92% 97% 93%  Elderly, frail male in no distress RRR without ectopy on monitor, +JVD when completely supine. +II/VI systolic murmur at apex.  Diminished without wheezes. Nonlabored, slightly tachypneic. +BS, soft, NT, ND No deformities, no significant pitting edema Alert, stuttering speech at times without dysarthria, follows commands, no focal deficits Calm  Data Reviewed: As summarized in H&P. ECG showed Sinus rhythm with 1st deg AVB, QTc 553mec. No ST segment elevations. T morphology wnl.  Assessment and Plan: ABLA on anemia of ESRD suspected upper GI bleed with melena:  - GI consulted ([facility] covering for Dr. HBenson Norway, maintain NPO.  - PPI gtt - Transfusing 2u PRBCs, monitor serial H/H - Hold eliquis.  - Hypotension noted to be chronic, will reorder home midodrine.   ESRD with hyperkalemia: iHD since admit Dec 2022 with cardiogenic shock.  - Currently with hyperkalemia without peaked T waves. K improved 5.9 > 5.7 with lokelma. Lokelma repeated, bicarb given, albuterol given. Give calcium gluconate IV. Recheck panel ordered. Nephrology notified of admission, missed routine HD 9/29, will dialyze urgently.   Chronic HFrEF: TEE 06/23: EF 25-30%, RV mildly reduced, severe ischemic MR. Followed by Dr. MAundra Dubinof [facility] - Check weights, monitor I/O - Hypotension and ESRD limiting GDMT. Manage volume with HD.  CAD: No angina at this time. Transfusion threshold likely 8g/dl at least.  - Continue statin. Holding anticoagulation with GI bleed.   PAF: Maintaining NSR since DCCV Jan  2023 - Continue amiodarone '100mg'$  daily. With slightly abnormal TSH at last OV, will check free T4. Monitor LFTs as well.  - Monitor heart rate/rhythm on telemetry.  - Hold eliquis with concern for GI bleeding  MR: Said to be poor candidate for MitraClip per cardiology previously.   EtOH abuse: Quit last year  Tobacco use: Quit. Cessation counseling provided.   Depression, anxiety: - Continue lowest dose benzo prn and hydroxyzine prn.  - Hold SSRI acutely with hyperkalemia/risk of QT prolongation.  Hypoalbuminemia:  - RD consult  Hyperbilirubinemia: Has been noted off  and on chronically.   HLD: Well controlled with statin. HDL 36, LDL 70.  - Continue statin  LFT elevations: Mild, nonspecific, may be due to amiodarone.  - Will monitor.   Prolonged QT interval.  - Monitor telemetry, avoid provocative agents, correct metabolic derangements.   Abnormal TSH: 6.248 PTA as above - Check free T4.   Hypocalcemia: Ca gluconate ordered. Ionized Ca 1.1.   Thrombocytopenia: Chronic, mild.  - Monitor.    Advance Care Planning: Discussed code status with patient and his daughter at admission. While he was previously full code, he confirms desire to be DNR at this time.   Consults: [facility], Dr. Loletha Carrow. Nephrology, Dr. Joelyn Oms  Family Communication: Daughter Reba at bedside  Severity of Illness: The appropriate patient status for this patient is OBSERVATION. Observation status is judged to be reasonable and necessary in order to provide the required intensity of service to ensure the patient's safety. The patient's presenting symptoms, physical exam findings, and initial radiographic and laboratory data in the context of their medical condition is felt to place them at decreased risk for further clinical deterioration. Furthermore, it is anticipated that the patient will be medically stable for discharge from the hospital within 2 midnights of admission.   Author: Patrecia Pour,  MD 02/01/2022 9:10 AM  For on call review [email or web address].

## 2022-02-01 NOTE — ED Notes (Signed)
ED TO INPATIENT HANDOFF REPORT  ED Nurse Name and Phone #: Phineas Real [phone]  S Name/Age/Gender Ruben [name] 76 y.o. male Room/Bed: 002C/002C  Code Status   Code Status: DNR  Home/SNF/Other Home Patient oriented to: self, place, time, and situation Is this baseline? Yes   Triage Complete: Triage complete  Chief Complaint Hyperkalemia [E87.5] Acute GI bleeding [K92.2] QT prolongation [R94.31] Gastrointestinal hemorrhage, unspecified gastrointestinal hemorrhage type [K92.2]  Triage Note Pt c/o dark, tarry stools x 3 days. Denies dizziness, chest pain, shortness of breath. Dialysis MWF, missed dialysis today.   Allergies Allergies  Allergen Reactions   Other Other (See Comments)    Pollen - sneezing, itching/watery eyes    Level of Care/Admitting Diagnosis ED Disposition     ED Disposition  Admit   Condition  --   Comment  Hospital Area: [facility] [[id number]]  Level of Care: Progressive [102]  Admit to Progressive based on following criteria: GI, ENDOCRINE disease patients with GI bleeding, acute liver failure or pancreatitis, stable with diabetic ketoacidosis or thyrotoxicosis (hypothyroid) state.  Admit to Progressive based on following criteria: NEPHROLOGY stable condition requiring close monitoring for AKI, requiring Hemodialysis or Peritoneal Dialysis either from expected electrolyte imbalance, acidosis, or fluid overload that can be managed by NIPPV or high flow oxygen.  May place patient in observation at [facility] or [place] if equivalent level of care is available:: Yes  Covid Evaluation: Asymptomatic - no recent exposure (last 10 days) testing not required  Diagnosis: Acute GI bleeding [759163]  Admitting Physician: Dory Horn [[id number]]  Attending Physician: Dory Horn [[id number]]          B Medical/Surgery History Past Medical History:  Diagnosis Date   Arthritis    Chronic kidney disease, stage 3b ([place])     ESRD (end stage renal disease) ([place])    ETOH abuse    History of stress test 09/02/2008   showed inferolateral scar without ischemia   Hx of echocardiogram 09/02/2008   was essentially normal   Hyperlipidemia    Hypertension    Myocardial infarction [facility])    Tobacco abuse    Past Surgical History:  Procedure Laterality Date   AV FISTULA PLACEMENT Left 05/27/2021   Procedure: LEFT ARTERIOVENOUS (AV) GRAFT CREATION;  Surgeon: Broadus John, MD;  Location: [facility];  Service: Vascular;  Laterality: Left;  PERIPHERAL NERVE BLOCK   BACK SURGERY     Dr Luiz Ochoa involving L3-L4 discectomy.   BIOPSY  06/14/2021   Procedure: BIOPSY;  Surgeon: Carol Ada, MD;  Location: [facility] ENDOSCOPY;  Service: Endoscopy;;   CARDIOVERSION N/A 05/13/2021   Procedure: CARDIOVERSION;  Surgeon: Larey Dresser, MD;  Location: [facility] ENDOSCOPY;  Service: Cardiovascular;  Laterality: N/A;   FLEXIBLE SIGMOIDOSCOPY N/A 06/14/2021   Procedure: Beryle Quant;  Surgeon: Carol Ada, MD;  Location: [place];  Service: Endoscopy;  Laterality: N/A;   HERNIA REPAIR     IR FLUORO GUIDE CV LINE RIGHT  05/21/2021   IR US GUIDE VASC ACCESS RIGHT  05/21/2021   RIGHT/LEFT HEART CATH AND CORONARY ANGIOGRAPHY N/A 05/09/2021   Procedure: RIGHT/LEFT HEART CATH AND CORONARY ANGIOGRAPHY;  Surgeon: Larey Dresser, MD;  Location: [place] CV LAB;  Service: Cardiovascular;  Laterality: N/A;   SPINE SURGERY     TEE WITHOUT CARDIOVERSION N/A 05/07/2021   Procedure: TRANSESOPHAGEAL ECHOCARDIOGRAM (TEE);  Surgeon: Larey Dresser, MD;  Location: [facility] ENDOSCOPY;  Service: Cardiovascular;  Laterality: N/A;   TEE WITHOUT CARDIOVERSION N/A 05/13/2021  Procedure: TRANSESOPHAGEAL ECHOCARDIOGRAM (TEE);  Surgeon: Larey Dresser, MD;  Location: [facility] ENDOSCOPY;  Service: Cardiovascular;  Laterality: N/A;   TEE WITHOUT CARDIOVERSION N/A 10/31/2021   Procedure: TRANSESOPHAGEAL ECHOCARDIOGRAM (TEE);  Surgeon: Larey Dresser, MD;  Location: [facility]  ENDOSCOPY;  Service: Cardiovascular;  Laterality: N/A;     A IV Location/Drains/Wounds Patient Lines/Drains/Airways Status     Active Line/Drains/Airways     Name Placement date Placement time Site Days   Peripheral IV 02/01/22 20 G Anterior;Proximal;Right Forearm 02/01/22  0650  Forearm  less than 1   Peripheral IV 02/01/22 20 G Anterior;Distal;Right Forearm 02/01/22  0650  Forearm  less than 1   Fistula / Graft Left Upper arm Arteriovenous vein graft 05/27/21  1347  Upper arm  250   Hemodialysis Catheter Right Subclavian --  --  Subclavian  --   Incision (Closed) 05/27/21 Arm Left 05/27/21  1349  -- 250   Wound / Incision (Open or Dehisced) 05/21/21 Puncture Chest Right tunneled HD catheter insertion site 05/21/21  1051  Chest  256   Wound / Incision (Open or Dehisced) 05/21/21 Puncture Neck Right venous access for tunneled HD catheter placement 05/21/21  1055  Neck  256            Intake/Output Last 24 hours  Intake/Output Summary (Last 24 hours) at 02/01/2022 2350 Last data filed at 02/01/2022 1906 Gross per 24 hour  Intake 697 ml  Output 1.4 ml  Net 695.6 ml    Labs/Imaging Results for orders placed or performed during the hospital encounter of 01/31/22 (from the past 48 hour(s))  Type and screen     Status: None (Preliminary result)   Collection Time: 01/31/22  9:45 PM  Result Value Ref Range   ABO/RH(D) A POS    Antibody Screen NEG    Sample Expiration 02/03/2022,2359    Unit Number [id number]    Blood Component Type RBC LR PHER1    Unit division 00    Status of Unit ISSUED    Transfusion Status OK TO TRANSFUSE    Crossmatch Result Compatible    Unit Number [id number]    Blood Component Type RED CELLS,LR    Unit division 00    Status of Unit ISSUED    Transfusion Status OK TO TRANSFUSE    Crossmatch Result      Compatible Performed at [facility], [address] [place], [place] [address]   Comprehensive metabolic panel     Status: Abnormal    Collection Time: 01/31/22  9:51 PM  Result Value Ref Range   Sodium 138 135 - 145 mmol/L   Potassium 5.9 (H) 3.5 - 5.1 mmol/L   Chloride 94 (L) 98 - 111 mmol/L   CO2 27 22 - 32 mmol/L   Glucose, Bld 104 (H) 70 - 99 mg/dL    Comment: Glucose reference range applies only to samples taken after fasting for at least 8 hours.   BUN 64 (H) 8 - 23 mg/dL   Creatinine, Ser 7.78 (H) 0.61 - 1.24 mg/dL   Calcium 10.0 8.9 - 10.3 mg/dL   Total Protein 6.2 (L) 6.5 - 8.1 g/dL   Albumin 3.0 (L) 3.5 - 5.0 g/dL   AST 43 (H) 15 - 41 U/L   ALT 47 (H) 0 - 44 U/L   Alkaline Phosphatase 68 38 - 126 U/L   Total Bilirubin 1.8 (H) 0.3 - 1.2 mg/dL   GFR, Estimated 7 (L) >60 mL/min  Comment: (NOTE) Calculated using the CKD-EPI Creatinine Equation (2021)    Anion gap 17 (H) 5 - 15    Comment: Performed at [facility], [place] [address] [place], [place] [address]  CBC     Status: Abnormal   Collection Time: 01/31/22  9:51 PM  Result Value Ref Range   WBC 5.6 4.0 - 10.5 K/uL   RBC 2.72 (L) 4.22 - 5.81 MIL/uL   Hemoglobin 8.8 (L) 13.0 - 17.0 g/dL   HCT 25.8 (L) 39.0 - 52.0 %   MCV 94.9 80.0 - 100.0 fL   MCH 32.4 26.0 - 34.0 pg   MCHC 34.1 30.0 - 36.0 g/dL   RDW 20.1 (H) 11.5 - 15.5 %   Platelets 128 (L) 150 - 400 K/uL    Comment: REPEATED TO VERIFY   nRBC 0.0 0.0 - 0.2 %    Comment: Performed at [facility], [place] [address] [place], [place] [address]  ABO/Rh     Status: None   Collection Time: 01/31/22  9:55 PM  Result Value Ref Range   ABO/RH(D)      A POS Performed at [facility] [address], [place], [place] [address]   I-stat chem 8, ED (not at [facility] or [facility])     Status: Abnormal   Collection Time: 01/31/22 11:52 PM  Result Value Ref Range   Sodium 135 135 - 145 mmol/L   Potassium 5.7 (H) 3.5 - 5.1 mmol/L   Chloride 95 (L) 98 - 111 mmol/L   BUN 59 (H) 8 - 23 mg/dL   Creatinine, Ser 8.40 (H) 0.61 - 1.24 mg/dL   Glucose, Bld 93 70 - 99 mg/dL    Comment: Glucose  reference range applies only to samples taken after fasting for at least 8 hours.   Calcium, Ion 1.10 (L) 1.15 - 1.40 mmol/L   TCO2 30 22 - 32 mmol/L   Hemoglobin 8.8 (L) 13.0 - 17.0 g/dL   HCT 26.0 (L) 39.0 - 52.0 %  POC occult blood, ED RN will collect     Status: Abnormal   Collection Time: 02/01/22  5:47 AM  Result Value Ref Range   Fecal Occult Bld POSITIVE (A) NEGATIVE  Prepare RBC (crossmatch)     Status: None   Collection Time: 02/01/22  6:03 AM  Result Value Ref Range   Order Confirmation      ORDER PROCESSED BY BLOOD BANK Performed at [facility], [place] [address] [place], [place] [address]   T4, free     Status: Abnormal   Collection Time: 02/01/22 12:30 PM  Result Value Ref Range   Free T4 1.18 (H) 0.61 - 1.12 ng/dL    Comment: (NOTE) Biotin ingestion may interfere with free T4 tests. If the results are inconsistent with the TSH level, previous test results, or the clinical presentation, then consider biotin interference. If needed, order repeat testing after stopping biotin. Performed at [facility], [place] [address], [place], [place] [address]   Renal function panel     Status: Abnormal   Collection Time: 02/01/22 12:30 PM  Result Value Ref Range   Sodium 139 135 - 145 mmol/L   Potassium 5.8 (H) 3.5 - 5.1 mmol/L   Chloride 97 (L) 98 - 111 mmol/L   CO2 26 22 - 32 mmol/L   Glucose, Bld 95 70 - 99 mg/dL    Comment: Glucose reference range applies only to samples taken after fasting  for at least 8 hours.   BUN 78 (H) 8 - 23 mg/dL   Creatinine, Ser 8.20 (H) 0.61 - 1.24 mg/dL   Calcium 9.8 8.9 - 10.3 mg/dL   Phosphorus 2.8 2.5 - 4.6 mg/dL   Albumin 2.7 (L) 3.5 - 5.0 g/dL   GFR, Estimated 6 (L) >60 mL/min    Comment: (NOTE) Calculated using the CKD-EPI Creatinine Equation (2021)    Anion gap 16 (H) 5 - 15    Comment: Performed at [facility] [address] [place], [place] [address]  Hepatitis B surface antigen     Status: None   Collection  Time: 02/01/22 12:30 PM  Result Value Ref Range   Hepatitis B Surface Ag NON REACTIVE NON REACTIVE    Comment: Performed at [facility] [address] [place], [place] [address]  Hepatitis B surface antibody     Status: Abnormal   Collection Time: 02/01/22 12:30 PM  Result Value Ref Range   Hep B S Ab Reactive (A) NON REACTIVE    Comment: (NOTE) Consistent with immunity, greater than 9.9 mIU/mL.  Performed at [facility], [place] [address], [place], [place] [address]   Hepatitis B core antibody, total     Status: None   Collection Time: 02/01/22 12:30 PM  Result Value Ref Range   Hep B Core Total Ab NON REACTIVE NON REACTIVE    Comment: Performed at [facility] [address], [place], [place] [address]  Protime-INR     Status: Abnormal   Collection Time: 02/01/22 12:30 PM  Result Value Ref Range   Prothrombin Time 43.0 (H) 11.4 - 15.2 seconds   INR 4.6 (HH) 0.8 - 1.2    Comment: CRITICAL RESULT CALLED TO, READ BACK BY AND VERIFIED WITH: REPEATED TO VERIFY SPECIMEN CHECKED FOR CLOTS SHoover Browns RN 1320 02/01/22 Ruben [name] (NOTE) INR goal varies based on device and disease states. Performed at [facility], [place] [address] [place], [place] [address]   Hemoglobin and hematocrit, blood     Status: Abnormal   Collection Time: 02/01/22 10:00 PM  Result Value Ref Range   Hemoglobin 10.0 (L) 13.0 - 17.0 g/dL   HCT 28.3 (L) 39.0 - 52.0 %    Comment: Performed at [facility] [address] [place], [place] [address]   No results found.  Pending Labs Unresulted Labs (From admission, onward)     Start     Ordered   02/02/22 0500  Protime-INR  Tomorrow morning,   R        02/01/22 1343   02/02/22 0500  Renal function panel  Tomorrow morning,   R        02/01/22 1344   02/01/22 1240  Hemoglobin and hematocrit, blood  Once,   R        02/01/22 1240   02/01/22 0915  Hemoglobin and hematocrit, blood  Now then every 6 hours,   R (with TIMED  occurrences)     Comments: Does not need now order - just start 6 hours after last hgb - call MD for hgb  8 or less    02/01/22 0916   02/01/22 0829  Hepatitis B surface antibody,quantitative  (New Admission Hemo Labs (Hepatitis B))  Once,   R        02/01/22 0830   Signed and Held  CBC  Tomorrow morning,   R  Signed and Held   Signed and Held  Protime-INR  Tomorrow morning,   R        Signed and Held   Signed and Held  Renal function panel  Tomorrow morning,   R        Signed and Held            Vitals/Pain Today's Vitals   02/01/22 1830 02/01/22 1900 02/01/22 1906 02/01/22 2031  BP: (!) 116/59 (!) 114/58 121/60   Pulse: 75 77 79   Resp: 18 (!) 21 (!) 21   Temp:   97.9 F (36.6 C) 98.1 F (36.7 C)  TempSrc:   Oral Oral  SpO2: 98% 97% 97%   PainSc:        Isolation Precautions No active isolations  Medications Medications  pantoprozole (PROTONIX) 80 mg /NS 100 mL infusion (8 mg/hr Intravenous New Bag/Given 02/01/22 1728)  pantoprazole (PROTONIX) injection 40 mg (has no administration in time range)  amiodarone (PACERONE) tablet 200 mg (has no administration in time range)  atorvastatin (LIPITOR) tablet 80 mg (80 mg Oral Given 02/01/22 2304)  midodrine (PROAMATINE) tablet 15 mg (15 mg Oral Given 02/01/22 1253)  nitroGLYCERIN (NITROSTAT) SL tablet 0.4 mg (has no administration in time range)  ALPRAZolam (XANAX) tablet 0.25 mg (has no administration in time range)  hydrOXYzine (ATARAX) tablet 10 mg (has no administration in time range)  gabapentin (NEURONTIN) capsule 100 mg (100 mg Oral Given 02/01/22 2304)  albuterol (PROVENTIL) (2.5 MG/3ML) 0.083% nebulizer solution 2.5 mg (has no administration in time range)  polyvinyl alcohol (LIQUIFILM TEARS) 1.4 % ophthalmic solution 1 drop (has no administration in time range)  sodium chloride flush (NS) 0.9 % injection 3 mL (3 mLs Intravenous Given 02/01/22 2308)  acetaminophen (TYLENOL) tablet 650 mg (has no administration in  time range)    Or  acetaminophen (TYLENOL) suppository 650 mg (has no administration in time range)  Darbepoetin Alfa (ARANESP) injection 100 mcg (100 mcg Intravenous Given 02/01/22 1734)  sodium zirconium cyclosilicate (LOKELMA) packet 10 g (10 g Oral Given 02/01/22 0509)  pantoprazole (PROTONIX) 80 mg /NS 100 mL IVPB (0 mg Intravenous Stopped 02/01/22 0800)  sodium chloride 0.9 % bolus 500 mL (0 mLs Intravenous Stopped 02/01/22 0917)  0.9 %  sodium chloride infusion (Manually program via Guardrails IV Fluids) ( Intravenous New Bag/Given 02/01/22 0759)  sodium bicarbonate injection 50 mEq (50 mEq Intravenous Given 02/01/22 0739)  albuterol (PROVENTIL) (2.5 MG/3ML) 0.083% nebulizer solution 2.5 mg (2.5 mg Nebulization Given 02/01/22 0800)  calcium gluconate 1 g/ 50 mL sodium chloride IVPB (0 mg Intravenous Stopped 02/01/22 1137)  phytonadione (VITAMIN K) 5 mg in dextrose 5 % 50 mL IVPB (5 mg Intravenous New Bag/Given 02/01/22 1734)    Mobility non-ambulatory Low fall risk   Focused Assessments Renal Assessment Handoff:  Hemodialysis Schedule: Hemodialysis Schedule: Tuesday/Thursday/Saturday Last Hemodialysis date and time: 02/01/2022    Restricted appendage: left arm   R Recommendations: See Admitting Provider Note  Report given to:   Additional Notes: Patient had Dialysis here at hospital on 02/01/22 took off 1.4liters, on 3 1/2 hours. Patient continues on Protonix drip. 2 units of blood given.

## 2022-02-02 ENCOUNTER — Encounter (HOSPITAL_COMMUNITY): Payer: Self-pay | Admitting: Internal Medicine

## 2022-02-02 DIAGNOSIS — R54 Age-related physical debility: Secondary | ICD-10-CM | POA: Diagnosis present

## 2022-02-02 DIAGNOSIS — I132 Hypertensive heart and chronic kidney disease with heart failure and with stage 5 chronic kidney disease, or end stage renal disease: Secondary | ICD-10-CM | POA: Diagnosis present

## 2022-02-02 DIAGNOSIS — D631 Anemia in chronic kidney disease: Secondary | ICD-10-CM | POA: Diagnosis present

## 2022-02-02 DIAGNOSIS — D62 Acute posthemorrhagic anemia: Secondary | ICD-10-CM | POA: Diagnosis present

## 2022-02-02 DIAGNOSIS — I340 Nonrheumatic mitral (valve) insufficiency: Secondary | ICD-10-CM | POA: Diagnosis present

## 2022-02-02 DIAGNOSIS — D6832 Hemorrhagic disorder due to extrinsic circulating anticoagulants: Secondary | ICD-10-CM | POA: Diagnosis present

## 2022-02-02 DIAGNOSIS — R17 Unspecified jaundice: Secondary | ICD-10-CM | POA: Diagnosis present

## 2022-02-02 DIAGNOSIS — Z992 Dependence on renal dialysis: Secondary | ICD-10-CM | POA: Diagnosis not present

## 2022-02-02 DIAGNOSIS — F419 Anxiety disorder, unspecified: Secondary | ICD-10-CM | POA: Diagnosis present

## 2022-02-02 DIAGNOSIS — Z66 Do not resuscitate: Secondary | ICD-10-CM | POA: Diagnosis present

## 2022-02-02 DIAGNOSIS — E785 Hyperlipidemia, unspecified: Secondary | ICD-10-CM | POA: Diagnosis present

## 2022-02-02 DIAGNOSIS — Z79899 Other long term (current) drug therapy: Secondary | ICD-10-CM | POA: Diagnosis not present

## 2022-02-02 DIAGNOSIS — F32A Depression, unspecified: Secondary | ICD-10-CM | POA: Diagnosis present

## 2022-02-02 DIAGNOSIS — I5022 Chronic systolic (congestive) heart failure: Secondary | ICD-10-CM | POA: Diagnosis present

## 2022-02-02 DIAGNOSIS — I480 Paroxysmal atrial fibrillation: Secondary | ICD-10-CM | POA: Diagnosis present

## 2022-02-02 DIAGNOSIS — F1011 Alcohol abuse, in remission: Secondary | ICD-10-CM | POA: Diagnosis present

## 2022-02-02 DIAGNOSIS — K31811 Angiodysplasia of stomach and duodenum with bleeding: Secondary | ICD-10-CM | POA: Diagnosis present

## 2022-02-02 DIAGNOSIS — N2581 Secondary hyperparathyroidism of renal origin: Secondary | ICD-10-CM | POA: Diagnosis present

## 2022-02-02 DIAGNOSIS — K922 Gastrointestinal hemorrhage, unspecified: Secondary | ICD-10-CM | POA: Diagnosis present

## 2022-02-02 DIAGNOSIS — K921 Melena: Secondary | ICD-10-CM | POA: Diagnosis not present

## 2022-02-02 DIAGNOSIS — E8809 Other disorders of plasma-protein metabolism, not elsewhere classified: Secondary | ICD-10-CM | POA: Diagnosis present

## 2022-02-02 DIAGNOSIS — I080 Rheumatic disorders of both mitral and aortic valves: Secondary | ICD-10-CM | POA: Diagnosis not present

## 2022-02-02 DIAGNOSIS — K2270 Barrett's esophagus without dysplasia: Secondary | ICD-10-CM | POA: Diagnosis not present

## 2022-02-02 DIAGNOSIS — E875 Hyperkalemia: Secondary | ICD-10-CM | POA: Diagnosis present

## 2022-02-02 DIAGNOSIS — N186 End stage renal disease: Secondary | ICD-10-CM | POA: Diagnosis present

## 2022-02-02 DIAGNOSIS — K31819 Angiodysplasia of stomach and duodenum without bleeding: Secondary | ICD-10-CM | POA: Diagnosis not present

## 2022-02-02 DIAGNOSIS — Z7901 Long term (current) use of anticoagulants: Secondary | ICD-10-CM | POA: Diagnosis not present

## 2022-02-02 DIAGNOSIS — D696 Thrombocytopenia, unspecified: Secondary | ICD-10-CM | POA: Diagnosis present

## 2022-02-02 LAB — RENAL FUNCTION PANEL
Albumin: 2.5 g/dL — ABNORMAL LOW (ref 3.5–5.0)
Anion gap: 9 (ref 5–15)
BUN: 38 mg/dL — ABNORMAL HIGH (ref 8–23)
CO2: 31 mmol/L (ref 22–32)
Calcium: 8.7 mg/dL — ABNORMAL LOW (ref 8.9–10.3)
Chloride: 97 mmol/L — ABNORMAL LOW (ref 98–111)
Creatinine, Ser: 5.02 mg/dL — ABNORMAL HIGH (ref 0.61–1.24)
GFR, Estimated: 11 mL/min — ABNORMAL LOW (ref >60)
Glucose, Bld: 85 mg/dL (ref 70–99)
Phosphorus: 2.4 mg/dL — ABNORMAL LOW (ref 2.5–4.6)
Potassium: 4.2 mmol/L (ref 3.5–5.1)
Sodium: 137 mmol/L (ref 135–145)

## 2022-02-02 LAB — HEMOGLOBIN AND HEMATOCRIT, BLOOD
HCT: 24.3 % — ABNORMAL LOW (ref 39.0–52.0)
HCT: 26.2 % — ABNORMAL LOW (ref 39.0–52.0)
HCT: 25.6 % — ABNORMAL LOW (ref 39.0–52.0)
Hemoglobin: 8.6 g/dL — ABNORMAL LOW (ref 13.0–17.0)
Hemoglobin: 9 g/dL — ABNORMAL LOW (ref 13.0–17.0)
Hemoglobin: 8.9 g/dL — ABNORMAL LOW (ref 13.0–17.0)

## 2022-02-02 LAB — PROTIME-INR
INR: 2.9 — ABNORMAL HIGH (ref 0.8–1.2)
Prothrombin Time: 30.1 seconds — ABNORMAL HIGH (ref 11.4–15.2)

## 2022-02-02 LAB — MRSA NEXT GEN BY PCR, NASAL: MRSA by PCR Next Gen: NOT DETECTED

## 2022-02-02 MED ORDER — CHLORHEXIDINE GLUCONATE CLOTH 2 % EX PADS
6.0000 | MEDICATED_PAD | Freq: Every day | CUTANEOUS | Status: DC
  Administered 2022-02-02 – 2022-02-04 (×4): 6 via TOPICAL

## 2022-02-02 MED ORDER — DARBEPOETIN ALFA 100 MCG/0.5ML IJ SOSY: 100.0000 ug | PREFILLED_SYRINGE | INTRAMUSCULAR | Status: DC

## 2022-02-02 MED ORDER — SERTRALINE HCL 25 MG PO TABS
25.0000 mg | ORAL_TABLET | Freq: Every morning | ORAL | Status: DC
  Administered 2022-02-03 – 2022-02-05 (×3): 25 mg via ORAL
  Filled 2022-02-02 (×3): qty 1

## 2022-02-02 MED ORDER — ORAL CARE MOUTH RINSE: 15.0000 mL | OROMUCOSAL | Status: DC | PRN

## 2022-02-02 NOTE — Progress Notes (Signed)
Admit: 01/31/2022 LOS: 0  62M ESRD MW with GIB  Subjective:  HD yesterday:  1.4L UF; tol well For EGD today? Hb 8.6 this AM Seen in room, wife at bedside No c/o  09/30 0701 - 10/01 0700 In: 1301.7 [I.V.:184.7; Blood:1067; IV Piggyback:50] Out: 1.4   Filed Weights   02/02/22 0106 02/02/22 0122  Weight: 69.3 kg 69.3 kg    Scheduled Meds:  amiodarone  200 mg Oral Daily   atorvastatin  80 mg Oral QHS   Chlorhexidine Gluconate Cloth  6 each Topical Daily   darbepoetin (ARANESP) injection - DIALYSIS  100 mcg Intravenous Q Sat-HD   gabapentin  100 mg Oral QHS   midodrine  15 mg Oral TID WC   [START ON 02/04/2022] pantoprazole  40 mg Intravenous Q12H   sodium chloride flush  3 mL Intravenous Q12H   Continuous Infusions:  pantoprazole 8 mg/hr (02/02/22 0351)   PRN Meds:.acetaminophen **OR** acetaminophen, albuterol, ALPRAZolam, hydrOXYzine, nitroGLYCERIN, mouth rinse, polyvinyl alcohol  Current Labs: reviewed    Physical Exam:  Blood pressure (!) 97/58, pulse 68, temperature 98.3 F (36.8 C), temperature source Oral, resp. rate 20, height '5\' 9"'$  (1.753 m), weight 69.3 kg, SpO2 91 %. NAD, resting comfortably NCAT EOMI RR nl s1s2 CTAB S/nt/nd LUE AVG +B/T Nonfocal, cn 2-12 grossly intact Nl mood/affec  Dialysis Orders:  MWF at [facility]  3:45hr, 400/800, EDW 700.5kg, 2K/2Ca, UFP #2, LUE AVG, no heparin - Mircera 58mg IV q 4 week - just reordered, last got Mircera 357m on 12/16/21. - Hgb 12.6 on 9/13, 11.2 on 9/20, 10.4 on 9/27 per outpatient lab records   Assessment/Plan:  GI Bleed: FOBT +, dark stools for several days. GI consulted, for EGD today.  ESRD: Usual MWF schedule, missed HD yesterday HD tomorrow, no heparin  Hypertension/volume: BP low sided, no edema on exam - minimal UF goal 1-2L  Anemia of ESRD + ABLA: Hgb 8.8, rec Aranesp 9/30. Transfused 1U PRBC.  Metabolic bone disease: CorrCa slightly high, Phos 2.8. No VDRA as outpatient, follow.  Nutrition:  Alb low,  will add supps when allowed to eat.  A-fib: Eliquis on hold.   RyPearson GrippeD 02/02/2022, 10:03 AM  Recent Labs  Lab 01/31/22 2151 01/31/22 2352 02/01/22 1230 02/02/22 0607  NA 138 135 139 137  K 5.9* 5.7* 5.8* 4.2  CL 94* 95* 97* 97*  CO2 27  --  26 31  GLUCOSE 104* 93 95 85  BUN 64* 59* 78* 38*  CREATININE 7.78* 8.40* 8.20* 5.02*  CALCIUM 10.0  --  9.8 8.7*  PHOS  --   --  2.8 2.4*   Recent Labs  Lab 01/31/22 2151 01/31/22 2352 02/01/22 2200 02/02/22 0607  WBC 5.6  --   --   --   HGB 8.8* 8.8* 10.0* 8.6*  HCT 25.8* 26.0* 28.3* 24.3*  MCV 94.9  --   --   --   PLT 128*  --   --   --

## 2022-02-02 NOTE — Plan of Care (Signed)
Initiated Care Plan  Problem: Education: Goal: Knowledge of General Education information will improve Description: Including pain rating scale, medication(s)/side effects and non-pharmacologic comfort measures Outcome: Progressing   Problem: Health Behavior/Discharge Planning: Goal: Ability to manage health-related needs will improve Outcome: Progressing   Problem: Clinical Measurements: Goal: Ability to maintain clinical measurements within normal limits will improve Outcome: Progressing Goal: Will remain free from infection Outcome: Progressing Goal: Diagnostic test results will improve Outcome: Progressing Goal: Respiratory complications will improve Outcome: Progressing Goal: Cardiovascular complication will be avoided Outcome: Progressing   Problem: Activity: Goal: Risk for activity intolerance will decrease Outcome: Progressing   Problem: Nutrition: Goal: Adequate nutrition will be maintained Outcome: Progressing   Problem: Coping: Goal: Level of anxiety will decrease Outcome: Progressing   Problem: Elimination: Goal: Will not experience complications related to bowel motility Outcome: Progressing Goal: Will not experience complications related to urinary retention Outcome: Progressing   Problem: Pain Managment: Goal: General experience of comfort will improve Outcome: Progressing   Problem: Safety: Goal: Ability to remain free from injury will improve Outcome: Progressing   Problem: Skin Integrity: Goal: Risk for impaired skin integrity will decrease Outcome: Progressing   Problem: Education: Goal: Ability to demonstrate management of disease process will improve Outcome: Progressing Goal: Ability to verbalize understanding of medication therapies will improve Outcome: Progressing Goal: Individualized Educational Video(s) Outcome: Progressing   Problem: Activity: Goal: Capacity to carry out activities will improve Outcome: Progressing    Problem: Cardiac: Goal: Ability to achieve and maintain adequate cardiopulmonary perfusion will improve Outcome: Progressing

## 2022-02-02 NOTE — Progress Notes (Addendum)
Patient ID: Ruben [name], male   DOB: 1945-11-17, 76 y.o.   MRN: [id number]    Progress Note   Subjective   Day #2  CC; melena, weakness-end-stage renal disease, on Eliquis and Plavix  Last dose Eliquis and Plavix -02/01/2019 3 PM  Patient dialyzed yesterday EGD canceled for today due to INR 2.9 improved over yesterday  Today hemoglobin 8.6/hematocrit 24.3-post 2 units yesterday Potassium 4.2/BUN 38/creatinine 5.0  Patient sitting up on the side of the bed, says he feels a bit better than yesterday, no stools overnight or this a.m.   Objective   Vital signs in last 24 hours: Temp:  [97.7 F (36.5 C)-98.5 F (36.9 C)] 98.3 F (36.8 C) (10/01 0734) Pulse Rate:  [67-80] 68 (10/01 0734) Resp:  [11-79] 20 (10/01 0734) BP: (89-121)/(50-68) 97/58 (10/01 0734) SpO2:  [91 %-99 %] 91 % (10/01 0734) Weight:  [69.3 kg] 69.3 kg (10/01 0122) Last BM Date : 02/01/22 General:    Older African-American male in NAD Heart:  irRegular rate and rhythm; no murmurs Lungs: Respirations even and unlabored, scattered basilar crackles Abdomen:  Soft, nontender and nondistended. Normal bowel sounds. Extremities:  Without edema. Neurologic:  Alert and oriented,  grossly normal neurologically. Psych:  Cooperative. Normal mood and affect.  Intake/Output from previous day: 09/30 0701 - 10/01 0700 In: 1301.7 [I.V.:184.7; Blood:1067; IV Piggyback:50] Out: 1.4  Intake/Output this shift: No intake/output data recorded.  Lab Results: Recent Labs    01/31/22 2151 01/31/22 2352 02/01/22 2200 02/02/22 0607  WBC 5.6  --   --   --   HGB 8.8* 8.8* 10.0* 8.6*  HCT 25.8* 26.0* 28.3* 24.3*  PLT 128*  --   --   --    BMET Recent Labs    01/31/22 2151 01/31/22 2352 02/01/22 1230 02/02/22 0607  NA 138 135 139 137  K 5.9* 5.7* 5.8* 4.2  CL 94* 95* 97* 97*  CO2 27  --  26 31  GLUCOSE 104* 93 95 85  BUN 64* 59* 78* 38*  CREATININE 7.78* 8.40* 8.20* 5.02*  CALCIUM 10.0  --  9.8 8.7*    LFT Recent Labs    01/31/22 2151 02/01/22 1230 02/02/22 0607  PROT 6.2*  --   --   ALBUMIN 3.0*   < > 2.5*  AST 43*  --   --   ALT 47*  --   --   ALKPHOS 68  --   --   BILITOT 1.8*  --   --    < > = values in this interval not displayed.   PT/INR Recent Labs    02/01/22 1230 02/02/22 0607  LABPROT 43.0* 30.1*  INR 4.6* 2.9*         Assessment / Plan:    #6 76 year old male with 4-day history of melena and progressive weakness, in setting of anticoagulation with Eliquis and Plavix  EGD has been delayed secondary to prolonged INR of 4.6 yesterday, down to 2.9 today  He has been hemodynamically stable, no further active bleeding overnight and hemoglobin stable post 2 units of packed RBCs  He has had complaints of epigastric discomfort postprandially over the past couple of months, has been on omeprazole  Rule out peptic ulcer disease, erosive gastropathy, occult malignancy  #2 severe congestive heart failure EF 25 to 30% #3 end-stage renal disease on dialysis-missed dialysis Friday, was dialyzed yesterday #4 mitral regurgitation #5 coronary artery disease status post NSTEMI associated with cardiogenic shock and atrial fibrillation #6  prior history of EtOH abuse  Plan; we will allow him a regular diet today, n.p.o. after midnight  EGD will be rescheduled for tomorrow Monday, 02/03/2022 with Dr. Benson Norway Continue to trend hemoglobins and transfuse as needed Continue to hold Eliquis and Plavix last doses were 01/30/22 PM Continue PPI infusion Repeat INR in a.m. Principal Problem:   Acute GI bleeding    LOS: 0 days   Amy Esterwood PA-C 02/02/2022, 8:43 AM  I have taken an interval history, thoroughly reviewed the chart and examined the patient. I agree with the Advanced Practitioner's note, impression and recommendations, and have recorded additional findings, impressions and recommendations below. I performed a substantive portion of this encounter (>50% time  spent), including a complete performance of the medical decision making.  My additional thoughts are as follows:  No further melena, hemoglobin is stabilized.  INR came down to 2.9 with vitamin K, but that still precluded EGD earlier today.  He looks better today, with a brighter affect and somewhat more interactive.  His daughter was at the bedside and updated on the plan. Check CBC and PT/INR tomorrow morning. Continue Protonix drip Dr. Benson Norway will return tomorrow for EGD if INR below 2.  (Signout will be given to him) Call our service before then if urgent issues arise   Nelida Meuse III Office:[phone]

## 2022-02-02 NOTE — Progress Notes (Signed)
Progress Note  Patient: Ruben [name] [id number]:[id number] DOB: 14-Aug-1945  DOA: 01/31/2022  DOS: 02/02/2022    Brief hospital course: Ruben [name] is a 76 y.o. male with medical history of ESRD, chronic HFrEF, PAF, EtOH and tobacco abuse, HTN, HLD, MR not a candidate for mitraclip, depression who presented to the ED with  onset dark tarry stools and crampy abdominal discomfort, early satiety.   In the ED BP soft at 104/71, HR 78, afebrile, with +FOBT, hgb down to 8.8g/dl from 12.6 in June, 10.9 in July. Platelets 128k (baseline 140-150's). Metabolic profile concerning for hyperkalemia (K 5.9) and mild LFT elevations (AST 43, ALT 47, TBili 1.8). Hemodialysis was performed with improvement in metabolic profile. GI was consulted and plans on EGD. INR is 4.8 and down to 2.9 with eliquis and plavix being held. No active ongoing bleeding noted.   Assessment and Plan: ABLA on anemia of ESRD suspected upper GI bleed with melena:  - GI consulted, canceled EGD with INR >2. Pt not having destabilizing bleeding, so allowing a diet and NPO p MN.  - PPI gtt - Hgb 8.8 >> 8.6 after 2u PRBCs given. Will monitor H/H serially with transfusion threshold 8g/dl - Hold eliquis.     Coagulopathy: INR elevated in setting of DOAC. Improving with holding DOAC - Vitamin K per GI - INR in AM.   ESRD with hyperkalemia: Pt on iHD since admit Dec 2022 with cardiogenic shock. Hyperkalemia resolved.  - HD 9/30 after missing 9/29 per nephrology. MWF schedule typically.    Chronic HFrEF: TEE 06/23: EF 25-30%, RV mildly reduced, severe ischemic MR. Followed by Dr. Aundra Dubin of [facility] - Check weights, monitor I/O - Hypotension and ESRD limiting GDMT. Manage volume with HD.   CAD: No angina at this time.  - Continue statin. Holding anticoagulation/antiplatelet with GI bleed.    PAF: Maintaining NSR since DCCV Jan 2023 - Continue amiodarone '100mg'$  daily.  - Monitor heart rate/rhythm on telemetry.  - Hold eliquis with  concern for GI bleeding   Elevated TSH: And elevated free T4, both mild.  - Recheck at follow up is suggested.   MR: Michela Pitcher to be poor candidate for MitraClip per cardiology previously.    EtOH abuse: Quit last year   Tobacco use: Quit. Cessation counseling provided.    Depression, anxiety: - Continue lowest dose benzo prn and hydroxyzine prn.  - Continue SSRI   Hypoalbuminemia:  - RD consult   Hyperbilirubinemia: Has been noted off and on chronically.    HLD: Well controlled with statin. HDL 36, LDL 70.  - Continue statin   LFT elevations: Mild, nonspecific, may be due to amiodarone.  - Will monitor.    Prolonged QT interval.  - Monitor telemetry, avoid provocative agents, correct metabolic derangements.    Hypocalcemia: Improved.    Thrombocytopenia: Chronic, mild.  - Monitor.  Subjective: No BM yet today, but thinks he'll be going soon. No abdominal pain nausea or vomiting. No red bleeding anywhere.   Objective: Vitals:   02/02/22 0345 02/02/22 0500 02/02/22 0734 02/02/22 1210  BP:  (!) 89/51 (!) 97/58 (!) 87/53  Pulse: 69 68 68 74  Resp: '19 20 20 20  '$ Temp: 98.5 F (36.9 C) 98.3 F (36.8 C) 98.3 F (36.8 C) 98.1 F (36.7 C)  TempSrc:  Oral Oral Oral  SpO2: 91% 91% 91% 93%  Weight:      Height:       Gen: Interactive 76 y.o. male in no distress Pulm:  Nonlabored breathing room air. Clear CV: Regular rate and rhythm. No murmur, rub, or gallop. No JVD, trace dependent edema. GI: Abdomen soft, non-tender, non-distended, with normoactive bowel sounds.  Ext: Warm, no deformities Skin: No rashes, lesions or ulcers on visualized skin. Neuro: Alert and oriented. No focal neurological deficits. Psych: Judgement and insight appear fair. Mood euthymic & affect congruent. Behavior is appropriate.    Data Personally reviewed: CBC: Recent Labs  Lab 01/31/22 2151 01/31/22 2352 02/01/22 2200 02/02/22 0607 02/02/22 1144  WBC 5.6  --   --   --   --   HGB 8.8* 8.8*  10.0* 8.6* 9.0*  HCT 25.8* 26.0* 28.3* 24.3* 26.2*  MCV 94.9  --   --   --   --   PLT 128*  --   --   --   --    Basic Metabolic Panel: Recent Labs  Lab 01/31/22 2151 01/31/22 2352 02/01/22 1230 02/02/22 0607  NA 138 135 139 137  K 5.9* 5.7* 5.8* 4.2  CL 94* 95* 97* 97*  CO2 27  --  26 31  GLUCOSE 104* 93 95 85  BUN 64* 59* 78* 38*  CREATININE 7.78* 8.40* 8.20* 5.02*  CALCIUM 10.0  --  9.8 8.7*  PHOS  --   --  2.8 2.4*   GFR: Estimated Creatinine Clearance: 12.3 mL/min (A) (by C-G formula based on SCr of 5.02 mg/dL (H)). Liver Function Tests: Recent Labs  Lab 01/31/22 2151 02/01/22 1230 02/02/22 0607  AST 43*  --   --   ALT 47*  --   --   ALKPHOS 68  --   --   BILITOT 1.8*  --   --   PROT 6.2*  --   --   ALBUMIN 3.0* 2.7* 2.5*   No results for input(s): "LIPASE", "AMYLASE" in the last 168 hours. No results for input(s): "AMMONIA" in the last 168 hours. Coagulation Profile: Recent Labs  Lab 02/01/22 1230 02/02/22 0607  INR 4.6* 2.9*   Cardiac Enzymes: No results for input(s): "CKTOTAL", "CKMB", "CKMBINDEX", "TROPONINI" in the last 168 hours. BNP (last 3 results) No results for input(s): "PROBNP" in the last 8760 hours. HbA1C: No results for input(s): "HGBA1C" in the last 72 hours. CBG: No results for input(s): "GLUCAP" in the last 168 hours. Lipid Profile: No results for input(s): "CHOL", "HDL", "LDLCALC", "TRIG", "CHOLHDL", "LDLDIRECT" in the last 72 hours. Thyroid Function Tests: Recent Labs    02/01/22 1230  FREET4 1.18*   Anemia Panel: No results for input(s): "VITAMINB12", "FOLATE", "FERRITIN", "TIBC", "IRON", "RETICCTPCT" in the last 72 hours. Urine analysis:    Component Value Date/Time   COLORURINE YELLOW 05/14/2021 1842   APPEARANCEUR CLEAR 05/14/2021 1842   LABSPEC >1.030 (H) 05/14/2021 1842   PHURINE 5.5 05/14/2021 1842   GLUCOSEU NEGATIVE 05/14/2021 1842   HGBUR NEGATIVE 05/14/2021 1842   BILIRUBINUR NEGATIVE 05/14/2021 1842    BILIRUBINUR negative 01/23/2020 1028   KETONESUR NEGATIVE 05/14/2021 1842   PROTEINUR NEGATIVE 05/14/2021 1842   UROBILINOGEN 1.0 01/23/2020 1028   NITRITE NEGATIVE 05/14/2021 1842   LEUKOCYTESUR NEGATIVE 05/14/2021 1842   Recent Results (from the past 240 hour(s))  MRSA Next Gen by PCR, Nasal     Status: None   Collection Time: 02/02/22  1:10 AM   Specimen: Nasal Mucosa; Nasal Swab  Result Value Ref Range Status   MRSA by PCR Next Gen NOT DETECTED NOT DETECTED Final    Comment: (NOTE) The GeneXpert MRSA Assay (FDA approved  for NASAL specimens only), is one component of a comprehensive MRSA colonization surveillance program. It is not intended to diagnose MRSA infection nor to guide or monitor treatment for MRSA infections. Test performance is not FDA approved in patients less than 58 years old. Performed at [facility], [place] [address], [place], [place] [address]      Family Communication: At bedside  Disposition: Status is: Inpatient Remains inpatient appropriate because: Needs EGD and stable hgb Planned Discharge Destination:  TBD  Patrecia Pour, MD 02/02/2022 1:19 PM Page by [email or web address]

## 2022-02-02 NOTE — Progress Notes (Signed)
[facility] night coverage note:  Called by RN: pt now wants to reverse DNR and be a full code.  Will make pt a full code therefore.

## 2022-02-02 NOTE — Care Management Obs Status (Signed)
[facility] [place] NOTIFICATION   Patient Details  Name: Ruben [name] MRN: [id number] Date of Birth: [birth year]   Medicare Observation Status Notification Given:  Yes    Estreya Clay G., RN 02/02/2022, 9:23 AM

## 2022-02-03 ENCOUNTER — Ambulatory Visit (HOSPITAL_COMMUNITY): Payer: Medicare Other

## 2022-02-03 DIAGNOSIS — Z7901 Long term (current) use of anticoagulants: Secondary | ICD-10-CM

## 2022-02-03 DIAGNOSIS — K921 Melena: Secondary | ICD-10-CM

## 2022-02-03 DIAGNOSIS — D62 Acute posthemorrhagic anemia: Secondary | ICD-10-CM | POA: Diagnosis not present

## 2022-02-03 LAB — CBC
HCT: 25.5 % — ABNORMAL LOW (ref 39.0–52.0)
HCT: 26.1 % — ABNORMAL LOW (ref 39.0–52.0)
Hemoglobin: 8.7 g/dL — ABNORMAL LOW (ref 13.0–17.0)
Hemoglobin: 9.2 g/dL — ABNORMAL LOW (ref 13.0–17.0)
MCH: 31.4 pg (ref 26.0–34.0)
MCH: 31.8 pg (ref 26.0–34.0)
MCHC: 34.1 g/dL (ref 30.0–36.0)
MCHC: 35.2 g/dL (ref 30.0–36.0)
MCV: 92.1 fL (ref 80.0–100.0)
MCV: 90.3 fL (ref 80.0–100.0)
Platelets: 122 10*3/uL — ABNORMAL LOW (ref 150–400)
Platelets: 143 10*3/uL — ABNORMAL LOW (ref 150–400)
RBC: 2.77 MIL/uL — ABNORMAL LOW (ref 4.22–5.81)
RBC: 2.89 MIL/uL — ABNORMAL LOW (ref 4.22–5.81)
RDW: 19.9 % — ABNORMAL HIGH (ref 11.5–15.5)
RDW: 20 % — ABNORMAL HIGH (ref 11.5–15.5)
WBC: 5.5 10*3/uL (ref 4.0–10.5)
WBC: 6.1 10*3/uL (ref 4.0–10.5)
nRBC: 0.5 % — ABNORMAL HIGH (ref 0.0–0.2)
nRBC: 0.3 % — ABNORMAL HIGH (ref 0.0–0.2)

## 2022-02-03 LAB — COMPREHENSIVE METABOLIC PANEL
ALT: 33 U/L (ref 0–44)
AST: 31 U/L (ref 15–41)
Albumin: 2.6 g/dL — ABNORMAL LOW (ref 3.5–5.0)
Alkaline Phosphatase: 60 U/L (ref 38–126)
Anion gap: 12 (ref 5–15)
BUN: 49 mg/dL — ABNORMAL HIGH (ref 8–23)
CO2: 29 mmol/L (ref 22–32)
Calcium: 9 mg/dL (ref 8.9–10.3)
Chloride: 97 mmol/L — ABNORMAL LOW (ref 98–111)
Creatinine, Ser: 6.71 mg/dL — ABNORMAL HIGH (ref 0.61–1.24)
GFR, Estimated: 8 mL/min — ABNORMAL LOW (ref >60)
Glucose, Bld: 99 mg/dL (ref 70–99)
Potassium: 4.1 mmol/L (ref 3.5–5.1)
Sodium: 138 mmol/L (ref 135–145)
Total Bilirubin: 1.5 mg/dL — ABNORMAL HIGH (ref 0.3–1.2)
Total Protein: 5.2 g/dL — ABNORMAL LOW (ref 6.5–8.1)

## 2022-02-03 LAB — BPAM RBC
Blood Product Expiration Date: 202310262359
Blood Product Expiration Date: 202310262359
ISSUE DATE / TIME: 202309300735
ISSUE DATE / TIME: 202309301228
Unit Type and Rh: 6200
Unit Type and Rh: 6200

## 2022-02-03 LAB — RENAL FUNCTION PANEL
Albumin: 2.8 g/dL — ABNORMAL LOW (ref 3.5–5.0)
Anion gap: 12 (ref 5–15)
BUN: 20 mg/dL (ref 8–23)
CO2: 26 mmol/L (ref 22–32)
Calcium: 8.6 mg/dL — ABNORMAL LOW (ref 8.9–10.3)
Chloride: 95 mmol/L — ABNORMAL LOW (ref 98–111)
Creatinine, Ser: 3.85 mg/dL — ABNORMAL HIGH (ref 0.61–1.24)
GFR, Estimated: 15 mL/min — ABNORMAL LOW (ref >60)
Glucose, Bld: 93 mg/dL (ref 70–99)
Phosphorus: 2.4 mg/dL — ABNORMAL LOW (ref 2.5–4.6)
Potassium: 3.5 mmol/L (ref 3.5–5.1)
Sodium: 133 mmol/L — ABNORMAL LOW (ref 135–145)

## 2022-02-03 LAB — TYPE AND SCREEN
ABO/RH(D): A POS
Antibody Screen: NEGATIVE
Unit division: 0
Unit division: 0

## 2022-02-03 LAB — PROTIME-INR
INR: 2.2 — ABNORMAL HIGH (ref 0.8–1.2)
INR: 2 — ABNORMAL HIGH (ref 0.8–1.2)
Prothrombin Time: 24.2 seconds — ABNORMAL HIGH (ref 11.4–15.2)
Prothrombin Time: 22.2 seconds — ABNORMAL HIGH (ref 11.4–15.2)

## 2022-02-03 LAB — HEMOGLOBIN AND HEMATOCRIT, BLOOD
HCT: 27.2 % — ABNORMAL LOW (ref 39.0–52.0)
Hemoglobin: 9.2 g/dL — ABNORMAL LOW (ref 13.0–17.0)

## 2022-02-03 LAB — HEPATITIS B SURFACE ANTIBODY, QUANTITATIVE: Hep B S AB Quant (Post): 19.8 m[IU]/mL (ref >9.9)

## 2022-02-03 MED ORDER — GUAIFENESIN-DM 100-10 MG/5ML PO SYRP
5.0000 mL | ORAL_SOLUTION | ORAL | Status: DC | PRN
  Administered 2022-02-03 – 2022-02-04 (×2): 5 mL via ORAL
  Filled 2022-02-03 (×2): qty 5

## 2022-02-03 NOTE — Progress Notes (Signed)
Complained of feeling congested and sob , albuterol neb given and tolerated well. Claimed feeling better after few min.

## 2022-02-03 NOTE — Progress Notes (Signed)
Progress Note  Patient: Ruben [name] [id number]:[id number] DOB: Jun 30, 1945  DOA: 01/31/2022  DOS: 02/03/2022    Brief hospital course: Ruben [name] is a 76 y.o. male with medical history of ESRD, chronic HFrEF, PAF, EtOH and tobacco abuse, HTN, HLD, MR not a candidate for mitraclip, depression who presented to the ED with  onset dark tarry stools and crampy abdominal discomfort, early satiety.   In the ED BP soft at 104/71, HR 78, afebrile, with +FOBT, hgb down to 8.8g/dl from 12.6 in June, 10.9 in July. Platelets 128k (baseline 140-150's). Metabolic profile concerning for hyperkalemia (K 5.9) and mild LFT elevations (AST 43, ALT 47, TBili 1.8). Hemodialysis was performed with improvement in metabolic profile. GI was consulted and plans on EGD. INR is 4.8 and down to 2.9 > 2.2 with eliquis being held. No active ongoing bleeding noted. Hgb 8.7.   Assessment and Plan: ABLA on anemia of ESRD suspected upper GI bleed with melena:  - GI consulted, canceled EGD again with INR 2.2. Pt not having destabilizing bleeding, so allowing a diet and NPO p MN.  - PPI gtt - Hgb 8.8 >> 8.7 after 2u PRBCs given. Has stabilized over 24 hours, will recheck prn bleeding or in AM.  - Hold eliquis.   - ESA given 9/30.    Coagulopathy: INR elevated in setting of DOAC. Improving with holding DOAC - Vitamin K per GI - INR in AM.   ESRD with hyperkalemia: Pt on iHD since admit Dec 2022 with cardiogenic shock. Hyperkalemia resolved.  - HD 9/30 after missing 9/29 per nephrology. MWF schedule typically, dialyzing 10/2.    Chronic HFrEF: TEE 06/23: EF 25-30%, RV mildly reduced, severe ischemic MR. Followed by Dr. Aundra Dubin of [facility] - Check weights, monitor I/O - Hypotension and ESRD limiting GDMT. Manage volume with HD.   CAD: No angina at this time.  - Continue statin. Holding anticoagulation/antiplatelet with GI bleed.    PAF: Maintaining NSR since DCCV Jan 2023 - Continue amiodarone '100mg'$  daily.  - Monitor  heart rate/rhythm on telemetry.  - Hold eliquis with concern for GI bleeding   Wheezing: No Hx COPD noted - Continue nebs.  - Formal PFTs recommended after follow up  Elevated TSH: And elevated free T4, both mild.  - Recheck at follow up is suggested.   MR: Michela Pitcher to be poor candidate for MitraClip per cardiology previously.    EtOH abuse: Quit last year   Tobacco use: Quit. Cessation counseling provided.    Depression, anxiety: - Continue lowest dose benzo prn and hydroxyzine prn.  - Continue SSRI   Hypoalbuminemia:  - RD consult   Hyperbilirubinemia: Has been noted off and on chronically.    HLD: Well controlled with statin. HDL 36, LDL 70.  - Continue statin   LFT elevations: Mild, nonspecific, may be due to amiodarone.  - Will monitor.    Prolonged QT interval.  - Monitor telemetry, avoid provocative agents, correct metabolic derangements.    Hypocalcemia: Improved.    Thrombocytopenia: Chronic, mild.  - Monitor.  Subjective: Getting up with therapy, no chest pain or dyspnea or dizziness. Had dark BM with red admixed, picture provided by daughter, yesterday. BP soft, limiting UF. EGD tomorrow per GI.  Objective: Vitals:   02/03/22 1230 02/03/22 1249 02/03/22 1251 02/03/22 1400  BP: (!) 113/58 111/61 112/68   Pulse: 91 92 92 85  Resp: (!) 23 (!) 23 (!) 24 20  Temp:   98 F (36.7 C)   TempSrc:  SpO2: 94% 96% 96% 99%  Weight:   69.6 kg   Height:       Gen: Nontoxic appearing male in no distress Pulm: Nonlabored breathing room air, receiving neb. End-expiratory wheeze very minimal. CV: Regular rate and rhythm. II/VI systolic murmur at the apex without rub, or gallop. No JVD, 1+ dependent edema. GI: Abdomen soft, non-tender, non-distended, with normoactive bowel sounds.  Ext: Warm, no deformities Skin: No rashes, lesions or ulcers on visualized skin. Neuro: Alert and oriented. No focal neurological deficits. Psych: Judgement and insight appear fair. Mood  euthymic & affect congruent. Behavior is appropriate.    Data Personally reviewed: CBC: Recent Labs  Lab 01/31/22 2151 01/31/22 2352 02/02/22 0607 02/02/22 1144 02/02/22 1821 02/03/22 0014 02/03/22 0723  WBC 5.6  --   --   --   --   --  5.5  HGB 8.8*   < > 8.6* 9.0* 8.9* 9.2* 8.7*  HCT 25.8*   < > 24.3* 26.2* 25.6* 27.2* 25.5*  MCV 94.9  --   --   --   --   --  92.1  PLT 128*  --   --   --   --   --  122*   < > = values in this interval not displayed.   Basic Metabolic Panel: Recent Labs  Lab 01/31/22 2151 01/31/22 2352 02/01/22 1230 02/02/22 0607 02/03/22 0723  NA 138 135 139 137 138  K 5.9* 5.7* 5.8* 4.2 4.1  CL 94* 95* 97* 97* 97*  CO2 27  --  '26 31 29  '$ GLUCOSE 104* 93 95 85 99  BUN 64* 59* 78* 38* 49*  CREATININE 7.78* 8.40* 8.20* 5.02* 6.71*  CALCIUM 10.0  --  9.8 8.7* 9.0  PHOS  --   --  2.8 2.4*  --    GFR: Estimated Creatinine Clearance: 9.2 mL/min (A) (by C-G formula based on SCr of 6.71 mg/dL (H)). Liver Function Tests: Recent Labs  Lab 01/31/22 2151 02/01/22 1230 02/02/22 0607 02/03/22 0723  AST 43*  --   --  31  ALT 47*  --   --  33  ALKPHOS 68  --   --  60  BILITOT 1.8*  --   --  1.5*  PROT 6.2*  --   --  5.2*  ALBUMIN 3.0* 2.7* 2.5* 2.6*   No results for input(s): "LIPASE", "AMYLASE" in the last 168 hours. No results for input(s): "AMMONIA" in the last 168 hours. Coagulation Profile: Recent Labs  Lab 02/01/22 1230 02/02/22 0607 02/03/22 0723  INR 4.6* 2.9* 2.2*   Cardiac Enzymes: No results for input(s): "CKTOTAL", "CKMB", "CKMBINDEX", "TROPONINI" in the last 168 hours. BNP (last 3 results) No results for input(s): "PROBNP" in the last 8760 hours. HbA1C: No results for input(s): "HGBA1C" in the last 72 hours. CBG: No results for input(s): "GLUCAP" in the last 168 hours. Lipid Profile: No results for input(s): "CHOL", "HDL", "LDLCALC", "TRIG", "CHOLHDL", "LDLDIRECT" in the last 72 hours. Thyroid Function Tests: Recent Labs     02/01/22 1230  FREET4 1.18*   Anemia Panel: No results for input(s): "VITAMINB12", "FOLATE", "FERRITIN", "TIBC", "IRON", "RETICCTPCT" in the last 72 hours. Urine analysis:    Component Value Date/Time   COLORURINE YELLOW 05/14/2021 1842   APPEARANCEUR CLEAR 05/14/2021 1842   LABSPEC >1.030 (H) 05/14/2021 1842   PHURINE 5.5 05/14/2021 1842   GLUCOSEU NEGATIVE 05/14/2021 1842   HGBUR NEGATIVE 05/14/2021 [place] NEGATIVE 05/14/2021 1842   BILIRUBINUR  negative 01/23/2020 1028   KETONESUR NEGATIVE 05/14/2021 1842   PROTEINUR NEGATIVE 05/14/2021 1842   UROBILINOGEN 1.0 01/23/2020 1028   NITRITE NEGATIVE 05/14/2021 1842   LEUKOCYTESUR NEGATIVE 05/14/2021 1842   Recent Results (from the past 240 hour(s))  MRSA Next Gen by PCR, Nasal     Status: None   Collection Time: 02/02/22  1:10 AM   Specimen: Nasal Mucosa; Nasal Swab  Result Value Ref Range Status   MRSA by PCR Next Gen NOT DETECTED NOT DETECTED Final    Comment: (NOTE) The GeneXpert MRSA Assay (FDA approved for NASAL specimens only), is one component of a comprehensive MRSA colonization surveillance program. It is not intended to diagnose MRSA infection nor to guide or monitor treatment for MRSA infections. Test performance is not FDA approved in patients less than 27 years old. Performed at [facility], [place] [address] [place], [place] [address]      Family Communication: At bedside  Disposition: Status is: Inpatient Remains inpatient appropriate because: Needs EGD and stable hgb Planned Discharge Destination:  TBD  Patrecia Pour, MD 02/03/2022 3:23 PM Page by [email or web address]

## 2022-02-03 NOTE — Progress Notes (Addendum)
Subjective: Ruben [name] is a 76 year old black male, with multiple medical problems including ESRD, alcohol and tobacco abuse, hypertension hyperlipidemia, CAD, paroxysmal atrial fibrillation on DOAC admitted for melenic stools. He was supposed to undergo an EGD today but as his INR was over to the procedure has been postponed for tomorrow.  Patient denies having any abdominal pain nausea vomiting.  He has had 1 small volume melenic stool today but denies any dizziness or syncope. His hemoglobin on admission was 10 g/dL dropped down to 9.2 g/dL yesterday and to 8.7 g/dL today. Patient has just returned from hemodialysis and seems to be doing well.  Objective: Vital signs in last 24 hours: Temp:  [97.8 F (36.6 C)-98.7 F (37.1 C)] 97.8 F (36.6 C) (10/02 0836) Pulse Rate:  [68-91] 91 (10/02 1230) Resp:  [15-23] 23 (10/02 1230) BP: (90-113)/(45-60) 113/58 (10/02 1230) SpO2:  [90 %-99 %] 94 % (10/02 1230) Weight:  [70.2 kg-70.4 kg] 70.2 kg (10/02 0836) Last BM Date : 02/02/22  Intake/Output from previous day: 10/01 0701 - 10/02 0700 In: 443 [P.O.:440; I.V.:3] Out: -  Intake/Output this shift: Total I/O In: 10 [I.V.:10] Out: -   General appearance: alert, cooperative, appears stated age, and no distress Resp: clear to auscultation bilaterally Cardio: Regular rate and rhythm systolic murmur audible at the apex GI: soft, non-tender; bowel sounds normal; no masses,  no organomegaly  Lab Results: Recent Labs    01/31/22 2151 01/31/22 2352 02/02/22 1821 02/03/22 0014 02/03/22 0723  WBC 5.6  --   --   --  5.5  HGB 8.8*   < > 8.9* 9.2* 8.7*  HCT 25.8*   < > 25.6* 27.2* 25.5*  PLT 128*  --   --   --  122*   < > = values in this interval not displayed.   BMET Recent Labs    02/01/22 1230 02/02/22 0607 02/03/22 0723  NA 139 137 138  K 5.8* 4.2 4.1  CL 97* 97* 97*  CO2 '26 31 29  '$ GLUCOSE 95 85 99  BUN 78* 38* 49*  CREATININE 8.20* 5.02* 6.71*  CALCIUM 9.8 8.7* 9.0    LFT Recent Labs    02/03/22 0723  PROT 5.2*  ALBUMIN 2.6*  AST 31  ALT 33  ALKPHOS 60  BILITOT 1.5*   PT/INR Recent Labs    02/02/22 0607 02/03/22 0723  LABPROT 30.1* 24.2*  INR 2.9* 2.2*   Hepatitis Panel Recent Labs    02/01/22 1230  HEPBSAG NON REACTIVE   Studies/Results: No results found.  Medications: I have reviewed the patient's current medications. Prior to Admission:  Medications Prior to Admission  Medication Sig Dispense Refill Last Dose   acetaminophen (TYLENOL) 650 MG CR tablet Take 1,300 mg by mouth every 8 (eight) hours as needed for pain.   Past Week   albuterol (PROVENTIL) (2.5 MG/3ML) 0.083% nebulizer solution Take 3 mLs (2.5 mg total) by nebulization every 4 (four) hours as needed for wheezing or shortness of breath. 75 mL 2 more than a month   ALPRAZolam (XANAX) 0.25 MG tablet Take 1 tablet (0.25 mg total) by mouth daily as needed for anxiety (anxiety with dialysis). 30 tablet 1 01/29/2022   amiodarone (PACERONE) 200 MG tablet Take 1 tablet (200 mg total) by mouth daily. 90 tablet 2 01/31/2022   apixaban (ELIQUIS) 5 MG TABS tablet Take 1 tablet (5 mg total) by mouth 2 (two) times daily. 60 tablet 6 01/31/2022 at 2200   atorvastatin (LIPITOR) 80 MG  tablet Take 1 tablet (80 mg total) by mouth daily. (Patient taking differently: Take 80 mg by mouth at bedtime.) 90 tablet 3 01/31/2022   cholecalciferol (VITAMIN D3) 25 MCG (1000 UNIT) tablet Take 1,000 Units by mouth in the morning.   01/31/2022   ethyl chloride spray Apply 1 Application topically as needed (prior to dialysis).   01/29/2022   gabapentin (NEURONTIN) 100 MG capsule Take 100 mg by mouth at bedtime.   01/31/2022   hydrocortisone (ANUSOL-HC) 25 MG suppository Place 1 suppository (25 mg total) rectally 2 (two) times daily. (Patient taking differently: Place 25 mg rectally 2 (two) times daily as needed for hemorrhoids.) 12 suppository 0 more than a month   hydrOXYzine (ATARAX) 10 MG tablet Take 10 mg by  mouth at bedtime as needed for anxiety.   01/31/2022 at pm   loratadine (CLARITIN) 10 MG tablet Take 10 mg by mouth every other day.   01/31/2022 at am   magnesium oxide (MAG-OX) 400 (240 Mg) MG tablet Take 400 mg by mouth daily in the afternoon.   01/31/2022   melatonin 5 MG TABS Take 5-10 mg by mouth at bedtime as needed (sleep).   Past Week   metoCLOPramide (REGLAN) 5 MG tablet Take 1 tablet (5 mg total) by mouth every 8 (eight) hours as needed for nausea. 6 tablet 0 01/31/2022   midodrine (PROAMATINE) 10 MG tablet Take 1.5 tablets (15 mg total) by mouth 3 (three) times daily with meals. 150 tablet 6 01/31/2022 at pm   omeprazole (PRILOSEC) 40 MG capsule Take 40 mg by mouth daily before breakfast.   01/31/2022   ondansetron (ZOFRAN) 4 MG tablet Take 1 tablet (4 mg total) by mouth daily as needed for nausea or vomiting. 30 tablet 1 more than a month   phenylephrine (,USE FOR PREPARATION-H,) 0.25 % suppository Place 1 suppository rectally 2 (two) times daily as needed for hemorrhoids.   more than a momth   polyethylene glycol powder (MIRALAX) 17 GM/SCOOP powder Take 17 g by mouth daily. Make be given every other day as well   01/31/2022   prochlorperazine (COMPAZINE) 5 MG tablet Take 5 mg by mouth 3 (three) times daily as needed for nausea or vomiting.   more than a month   Propylene Glycol (SYSTANE COMPLETE) 0.6 % SOLN Place 1 drop into both eyes 3 (three) times daily as needed (dry/irritated eyes.).   Past Week   sertraline (ZOLOFT) 25 MG tablet Take 25 mg by mouth in the morning.   01/31/2022   nitroGLYCERIN (NITROSTAT) 0.4 MG SL tablet Place 1 tablet (0.4 mg total) under the tongue every 5 (five) minutes as needed for chest pain. (Patient not taking: Reported on 02/01/2022) 100 tablet 3 Not Taking   NONFORMULARY OR COMPOUNDED ITEM Place 1 Application rectally 2 (two) times daily as needed for hemorrhoids. Diltiazem 2% Ointment (Patient not taking: Reported on 02/01/2022)   Not Taking   traZODone (DESYREL)  100 MG tablet Take 100 mg by mouth at bedtime as needed for sleep. (Patient not taking: Reported on 02/01/2022)   Not Taking   Scheduled:  amiodarone  200 mg Oral Daily   atorvastatin  80 mg Oral QHS   Chlorhexidine Gluconate Cloth  6 each Topical Daily   [START ON 02/07/2022] darbepoetin (ARANESP) injection - DIALYSIS  100 mcg Intravenous Q Fri-HD   gabapentin  100 mg Oral QHS   midodrine  15 mg Oral TID WC   [START ON 02/04/2022] pantoprazole  40 mg Intravenous  Q12H   sertraline  25 mg Oral q AM   sodium chloride flush  3 mL Intravenous Q12H   Continuous:  pantoprazole 8 mg/hr (02/03/22 1421)   BVA:POLIDCVUDTHYH **OR** acetaminophen, albuterol, ALPRAZolam, hydrOXYzine, nitroGLYCERIN, mouth rinse, polyvinyl alcohol  Assessment/Plan: 1) Acute posthemorrhagic anemia, heme superimposed on anemia of chronic disease secondary to ESRD. Anticoagulants on hold he has received 2 units of packed red blood cells.  EGD planned for tomorrow-rule out AVMs gastric ulcers etc. 2) CAD status post non-STEMI associated cardiogenic shock and PAF-Eliquis is on hold 3) ESRD on hemodialysis/metabolic bone disease. 4) severe congestive heart failure with a EF of 25 to 30%. 5) Mitral regurgitation. 6) History of alcohol abuse  LOS: 1 day   Juanita Craver 02/03/2022, 12:43 PM

## 2022-02-03 NOTE — Progress Notes (Signed)
[facility] Progress Note  Subjective: seen on dialysis, no c/o's.   Vitals:   02/03/22 1230 02/03/22 1249 02/03/22 1251 02/03/22 1400  BP: (!) 113/58 111/61 112/68   Pulse: 91 92 92 85  Resp: (!) 23 (!) 23 (!) 24 20  Temp:   98 F (36.7 C)   TempSrc:      SpO2: 94% 96% 96% 99%  Weight:   69.6 kg   Height:        Exam:  alert, nad   no jvd  Chest cta bilat  Cor reg no RG  Abd soft ntnd no ascites   Ext no LE edema   Alert, NF, ox3    LUE AVG+bruit   OP HD:  MWF at [facility]  3h 43mn  400/800   70.5kg    2K/2Ca bath  P2   Hep none  LUE AVG - Mircera 338m IV q 4 week - just reordered, last got Mircera 3079mon 12/16/21. - Hgb 12.6 on 9/13, 11.2 on 9/20, 10.4 on 9/27 per outpatient lab records   Assessment/Plan:  GI Bleed: FOBT +, dark stools for several days. Hb 9.2 today. Per GI/ pmd.   ESRD - Usual MWF schedule. Had HD here off schedule 9/30. Next HD today.   HTN/volume - BP low sided, no edema on exam. Small UF today.   Anemia of ESRD + ABLA-  Hgb 8.8, sp aranesp here 100ug on Friday. Transfused 1U PRBC.  Metabolic bone disease- CorrCa slightly high, Phos 2.8. No VDRA as outpatient, follow.  Nutrition- Alb low, will add supps when allowed to eat.  A-fib- Eliquis on hold  Rob Marikay Roads 02/03/2022, 3:20 PM   Recent Labs  Lab 02/01/22 1230 02/01/22 2200 02/02/22 0607 02/02/22 1144 02/03/22 0014 02/03/22 0723  HGB  --    < > 8.6*   < > 9.2* 8.7*  ALBUMIN 2.7*  --  2.5*  --   --  2.6*  CALCIUM 9.8  --  8.7*  --   --  9.0  PHOS 2.8  --  2.4*  --   --   --   CREATININE 8.20*  --  5.02*  --   --  6.71*  K 5.8*  --  4.2  --   --  4.1   < > = values in this interval not displayed.   No results for input(s): "IRON", "TIBC", "FERRITIN" in the last 168 hours. Inpatient medications:  amiodarone  200 mg Oral Daily   atorvastatin  80 mg Oral QHS   Chlorhexidine Gluconate Cloth  6 each Topical Daily   [START ON 02/07/2022] darbepoetin (ARANESP) injection -  DIALYSIS  100 mcg Intravenous Q Fri-HD   gabapentin  100 mg Oral QHS   midodrine  15 mg Oral TID WC   [START ON 02/04/2022] pantoprazole  40 mg Intravenous Q12H   sertraline  25 mg Oral q AM   sodium chloride flush  3 mL Intravenous Q12H    pantoprazole 8 mg/hr (02/03/22 1421)   acetaminophen **OR** acetaminophen, albuterol, ALPRAZolam, hydrOXYzine, nitroGLYCERIN, mouth rinse, polyvinyl alcohol

## 2022-02-03 NOTE — Plan of Care (Signed)

## 2022-02-03 NOTE — Progress Notes (Signed)
Transported to  hemodialysis by bed awake and alert. 

## 2022-02-03 NOTE — Evaluation (Signed)
Physical Therapy Evaluation Patient Details Name: Ruben [name] MRN: [id number] DOB: Jan 24, 1946 Today's Date: 02/03/2022  History of Present Illness  Pt is a 76 y/o M who presented to [facility] with c/o dark tarry stool. abdominal cramps/discomfort, and early satiety. Admitted for acute upper GI bleeding. Signicant PMH of ESRD, chronic HFrEF, paroxysmal afib, EtOH and tobacco abuse, HTN, HLD, depression, mitral valve regurgiation not a candidate for mitraclip, MI, and history of stress test and echocardiogram.   Clinical Impression  Pt presents with an overall decrease in functional mobility secondary to above. PTA, pt ambulated mod independent with RW, requiring assistance with IADLs and occasional assistance with self care activities. Educ pt on importance of mobility. Today, pt able to perform standing balance supervision and ambulated 140 ft min guard with RW. Pt required O2 monitoring during mobility, no value to report but pt demonstrated minimal fatigue with no reports of dizziness. Pt would benefit from continued acute PT services to maximize functional mobility and independence prior to d/c with home health services.         Recommendations for follow up therapy are one component of a multi-disciplinary discharge planning process, led by the attending physician.  Recommendations may be updated based on patient status, additional functional criteria and insurance authorization.  Follow Up Recommendations Home health PT      Assistance Recommended at Discharge Set up Supervision/Assistance  Patient can return home with the following  Help with stairs or ramp for entrance;A little help with bathing/dressing/bathroom;A little help with walking and/or transfers    Equipment Recommendations None recommended by PT  Recommendations for Other Services       Functional Status Assessment Patient has had a recent decline in their functional status and demonstrates the ability to  make significant improvements in function in a reasonable and predictable amount of time.     Precautions / Restrictions Precautions Precautions: Fall Restrictions Weight Bearing Restrictions: No      Mobility  Bed Mobility               General bed mobility comments: Pt sitting EOB upon arrival after family assisted with toileting care    Transfers Overall transfer level: Needs assistance Equipment used: Rolling walker (2 wheels) Transfers: Sit to/from Stand Sit to Stand: Supervision           General transfer comment: Pt performed 3-4 sit to stands during treatment session with visual ease and awareness of abilities    Ambulation/Gait Ambulation/Gait assistance: Min guard Gait Distance (Feet): 140 Feet Assistive device: Rolling walker (2 wheels) Gait Pattern/deviations: Decreased stride length Gait velocity: decreased     General Gait Details: Ambulated with 0 rest breaks, attempted checking SpO2 with unreliable reading, pt expressed "not at all" when asked about dizziness or lightheadedness  Stairs            Wheelchair Mobility    Modified Rankin (Stroke Patients Only)       Balance Overall balance assessment: Needs assistance Sitting-balance support: No upper extremity supported, Feet supported Sitting balance-[name] Scale: Good Sitting balance - Comments: Pt able to sit EOB for prolonged period of time with no visual concerns of fatigue   Standing balance support: Bilateral upper extremity supported, During functional activity, Single extremity supported, No upper extremity supported Standing balance-[name] Scale: Fair                               Pertinent  Vitals/Pain Pain Assessment Pain Assessment: No/denies pain    Home Living Family/patient expects to be discharged to:: Private residence Living Arrangements: Spouse/significant other Available Help at Discharge: Family Type of Home: House Home Access: Stairs to  enter Entrance Stairs-Rails: Can reach both Entrance Stairs-Number of Steps: 2   Home Layout: One level Home Equipment: Conservation officer, nature (2 wheels);Rollator (4 wheels);Cane - single point;Toilet riser Additional Comments: Pts daughter lives within 2 miles of the home    Prior Function Prior Level of Function : Needs assist       Physical Assist : Mobility (physical) Mobility (physical): Gait [facility])   Mobility Comments: Pt typically ambulates with SPC, prefers not to use it but aware that it dereases safety. Reports 1 fall in last 6 months. ADLs Comments: Pts spouse completed most IADLs and consistent delivery of food from friends/family     Hand Dominance        Extremity/Trunk Assessment   Upper Extremity Assessment Upper Extremity Assessment: Overall WFL for tasks assessed (Pt able to grip RW with no difficulties)    Lower Extremity Assessment Lower Extremity Assessment: Overall WFL for tasks assessed (Able to perform transfers with functional strength and appropriate sequencing)    Cervical / Trunk Assessment Cervical / Trunk Assessment: Kyphotic  Communication   Communication: No difficulties  Cognition Arousal/Alertness: Awake/alert Behavior During Therapy: Flat affect Overall Cognitive Status: Within Functional Limits for tasks assessed Area of Impairment: Awareness, Problem solving                           Awareness: Emergent Problem Solving: Slow processing General Comments: Pt alert and oriented requiring mild increase in time for processing        General Comments General comments (skin integrity, edema, etc.): Pt wears 2L oxygen at night, stable 96% Sp02 reading at rest pre-mobility; pt's daughter highly aware of medical hx    Exercises     Assessment/Plan    PT Assessment Patient needs continued PT services  PT Problem List Decreased strength;Decreased activity tolerance;Decreased balance;Decreased mobility       PT Treatment  Interventions DME instruction;Balance training;Gait training;Stair training;Functional mobility training;Patient/family education;Therapeutic activities;Therapeutic exercise    PT Goals (Current goals can be found in the Care Plan section)  Acute Rehab PT Goals Patient Stated Goal: to return back to home PT Goal Formulation: With patient Time For Goal Achievement: 02/17/22 Potential to Achieve Goals: Good    Frequency Min 3X/week     Co-evaluation               AM-PAC PT "6 Clicks" Mobility  Outcome Measure Help needed turning from your back to your side while in a flat bed without using bedrails?: None Help needed moving from lying on your back to sitting on the side of a flat bed without using bedrails?: None Help needed moving to and from a bed to a chair (including a wheelchair)?: A Little Help needed standing up from a chair using your arms (e.g., wheelchair or bedside chair)?: A Little Help needed to walk in hospital room?: A Little Help needed climbing 3-5 steps with a railing? : A Lot 6 Click Score: 19    End of Session Equipment Utilized During Treatment: Gait belt Activity Tolerance: Patient tolerated treatment well Patient left: in chair;with call bell/phone within reach;with family/visitor present Nurse Communication: Mobility status PT Visit Diagnosis: Other abnormalities of gait and mobility (R26.89);Muscle weakness (generalized) (M62.81)    Time: 4268-3419  PT Time Calculation (min) (ACUTE ONLY): 25 min   Charges:   PT Evaluation $PT Eval Moderate Complexity: 1 Mod          [address] Ludwig, SPT   [place] Johnasia Liese 02/03/2022, 5:27 PM

## 2022-02-03 NOTE — Progress Notes (Signed)
Pt receives out-pt HD at [place] on MWF. Pt arrives at 10:20 am for 10:40 chair time. Will assist as needed.   Melven Sartorius Renal Navigator [phone]

## 2022-02-03 NOTE — Progress Notes (Signed)
PT Cancellation Note  Patient Details Name: Ruben [name] MRN: [id number] DOB: 1946/04/18   Cancelled Treatment:    Reason Eval/Treat Not Completed: Patient at procedure or test/unavailable (HD). Will follow for PT eval as schedule permits.  Crista Curb Ludwig, SPT  Kiko Ripp 02/03/2022, 8:21 AM

## 2022-02-03 NOTE — Progress Notes (Signed)
Back from hemodialysis by bed awake and alert. 

## 2022-02-03 NOTE — H&P (View-Only) (Signed)
Subjective: Ruben [name] is a 76 year old black male, with multiple medical problems including ESRD, alcohol and tobacco abuse, hypertension hyperlipidemia, CAD, paroxysmal atrial fibrillation on DOAC admitted for melenic stools. He was supposed to undergo an EGD today but as his INR was over to the procedure has been postponed for tomorrow.  Patient denies having any abdominal pain nausea vomiting.  He has had 1 small volume melenic stool today but denies any dizziness or syncope. His hemoglobin on admission was 10 g/dL dropped down to 9.2 g/dL yesterday and to 8.7 g/dL today. Patient has just returned from hemodialysis and seems to be doing well.  Objective: Vital signs in last 24 hours: Temp:  [97.8 F (36.6 C)-98.7 F (37.1 C)] 97.8 F (36.6 C) (10/02 0836) Pulse Rate:  [68-91] 91 (10/02 1230) Resp:  [15-23] 23 (10/02 1230) BP: (90-113)/(45-60) 113/58 (10/02 1230) SpO2:  [90 %-99 %] 94 % (10/02 1230) Weight:  [70.2 kg-70.4 kg] 70.2 kg (10/02 0836) Last BM Date : 02/02/22  Intake/Output from previous day: 10/01 0701 - 10/02 0700 In: 443 [P.O.:440; I.V.:3] Out: -  Intake/Output this shift: Total I/O In: 10 [I.V.:10] Out: -   General appearance: alert, cooperative, appears stated age, and no distress Resp: clear to auscultation bilaterally Cardio: Regular rate and rhythm systolic murmur audible at the apex GI: soft, non-tender; bowel sounds normal; no masses,  no organomegaly  Lab Results: Recent Labs    01/31/22 2151 01/31/22 2352 02/02/22 1821 02/03/22 0014 02/03/22 0723  WBC 5.6  --   --   --  5.5  HGB 8.8*   < > 8.9* 9.2* 8.7*  HCT 25.8*   < > 25.6* 27.2* 25.5*  PLT 128*  --   --   --  122*   < > = values in this interval not displayed.   BMET Recent Labs    02/01/22 1230 02/02/22 0607 02/03/22 0723  NA 139 137 138  K 5.8* 4.2 4.1  CL 97* 97* 97*  CO2 '26 31 29  '$ GLUCOSE 95 85 99  BUN 78* 38* 49*  CREATININE 8.20* 5.02* 6.71*  CALCIUM 9.8 8.7* 9.0    LFT Recent Labs    02/03/22 0723  PROT 5.2*  ALBUMIN 2.6*  AST 31  ALT 33  ALKPHOS 60  BILITOT 1.5*   PT/INR Recent Labs    02/02/22 0607 02/03/22 0723  LABPROT 30.1* 24.2*  INR 2.9* 2.2*   Hepatitis Panel Recent Labs    02/01/22 1230  HEPBSAG NON REACTIVE   Studies/Results: No results found.  Medications: I have reviewed the patient's current medications. Prior to Admission:  Medications Prior to Admission  Medication Sig Dispense Refill Last Dose   acetaminophen (TYLENOL) 650 MG CR tablet Take 1,300 mg by mouth every 8 (eight) hours as needed for pain.   Past Week   albuterol (PROVENTIL) (2.5 MG/3ML) 0.083% nebulizer solution Take 3 mLs (2.5 mg total) by nebulization every 4 (four) hours as needed for wheezing or shortness of breath. 75 mL 2 more than a month   ALPRAZolam (XANAX) 0.25 MG tablet Take 1 tablet (0.25 mg total) by mouth daily as needed for anxiety (anxiety with dialysis). 30 tablet 1 01/29/2022   amiodarone (PACERONE) 200 MG tablet Take 1 tablet (200 mg total) by mouth daily. 90 tablet 2 01/31/2022   apixaban (ELIQUIS) 5 MG TABS tablet Take 1 tablet (5 mg total) by mouth 2 (two) times daily. 60 tablet 6 01/31/2022 at 2200   atorvastatin (LIPITOR) 80 MG  tablet Take 1 tablet (80 mg total) by mouth daily. (Patient taking differently: Take 80 mg by mouth at bedtime.) 90 tablet 3 01/31/2022   cholecalciferol (VITAMIN D3) 25 MCG (1000 UNIT) tablet Take 1,000 Units by mouth in the morning.   01/31/2022   ethyl chloride spray Apply 1 Application topically as needed (prior to dialysis).   01/29/2022   gabapentin (NEURONTIN) 100 MG capsule Take 100 mg by mouth at bedtime.   01/31/2022   hydrocortisone (ANUSOL-HC) 25 MG suppository Place 1 suppository (25 mg total) rectally 2 (two) times daily. (Patient taking differently: Place 25 mg rectally 2 (two) times daily as needed for hemorrhoids.) 12 suppository 0 more than a month   hydrOXYzine (ATARAX) 10 MG tablet Take 10 mg by  mouth at bedtime as needed for anxiety.   01/31/2022 at pm   loratadine (CLARITIN) 10 MG tablet Take 10 mg by mouth every other day.   01/31/2022 at am   magnesium oxide (MAG-OX) 400 (240 Mg) MG tablet Take 400 mg by mouth daily in the afternoon.   01/31/2022   melatonin 5 MG TABS Take 5-10 mg by mouth at bedtime as needed (sleep).   Past Week   metoCLOPramide (REGLAN) 5 MG tablet Take 1 tablet (5 mg total) by mouth every 8 (eight) hours as needed for nausea. 6 tablet 0 01/31/2022   midodrine (PROAMATINE) 10 MG tablet Take 1.5 tablets (15 mg total) by mouth 3 (three) times daily with meals. 150 tablet 6 01/31/2022 at pm   omeprazole (PRILOSEC) 40 MG capsule Take 40 mg by mouth daily before breakfast.   01/31/2022   ondansetron (ZOFRAN) 4 MG tablet Take 1 tablet (4 mg total) by mouth daily as needed for nausea or vomiting. 30 tablet 1 more than a month   phenylephrine (,USE FOR PREPARATION-H,) 0.25 % suppository Place 1 suppository rectally 2 (two) times daily as needed for hemorrhoids.   more than a momth   polyethylene glycol powder (MIRALAX) 17 GM/SCOOP powder Take 17 g by mouth daily. Make be given every other day as well   01/31/2022   prochlorperazine (COMPAZINE) 5 MG tablet Take 5 mg by mouth 3 (three) times daily as needed for nausea or vomiting.   more than a month   Propylene Glycol (SYSTANE COMPLETE) 0.6 % SOLN Place 1 drop into both eyes 3 (three) times daily as needed (dry/irritated eyes.).   Past Week   sertraline (ZOLOFT) 25 MG tablet Take 25 mg by mouth in the morning.   01/31/2022   nitroGLYCERIN (NITROSTAT) 0.4 MG SL tablet Place 1 tablet (0.4 mg total) under the tongue every 5 (five) minutes as needed for chest pain. (Patient not taking: Reported on 02/01/2022) 100 tablet 3 Not Taking   NONFORMULARY OR COMPOUNDED ITEM Place 1 Application rectally 2 (two) times daily as needed for hemorrhoids. Diltiazem 2% Ointment (Patient not taking: Reported on 02/01/2022)   Not Taking   traZODone (DESYREL)  100 MG tablet Take 100 mg by mouth at bedtime as needed for sleep. (Patient not taking: Reported on 02/01/2022)   Not Taking   Scheduled:  amiodarone  200 mg Oral Daily   atorvastatin  80 mg Oral QHS   Chlorhexidine Gluconate Cloth  6 each Topical Daily   [START ON 02/07/2022] darbepoetin (ARANESP) injection - DIALYSIS  100 mcg Intravenous Q Fri-HD   gabapentin  100 mg Oral QHS   midodrine  15 mg Oral TID WC   [START ON 02/04/2022] pantoprazole  40 mg Intravenous  Q12H   sertraline  25 mg Oral q AM   sodium chloride flush  3 mL Intravenous Q12H   Continuous:  pantoprazole 8 mg/hr (02/03/22 1421)   ERQ:[id number] **OR** acetaminophen, albuterol, ALPRAZolam, hydrOXYzine, nitroGLYCERIN, mouth rinse, polyvinyl alcohol  Assessment/Plan: 1) Acute posthemorrhagic anemia, heme superimposed on anemia of chronic disease secondary to ESRD. Anticoagulants on hold he has received 2 units of packed red blood cells.  EGD planned for tomorrow-rule out AVMs gastric ulcers etc. 2) CAD status post non-STEMI associated cardiogenic shock and PAF-Eliquis is on hold 3) ESRD on hemodialysis/metabolic bone disease. 4) severe congestive heart failure with a EF of 25 to 30%. 5) Mitral regurgitation. 6) History of alcohol abuse  LOS: 1 day   Juanita Craver 02/03/2022, 12:43 PM

## 2022-02-03 NOTE — Progress Notes (Signed)
Pt experiencing chronic cough. Paged MD regarding diet orders. Diet orders in chart state NPO effective now. Pt has PO meds due tonight. Informed MD. MD verbal order states clear liquid diet until midnight, NPO after midnight.

## 2022-02-03 NOTE — Progress Notes (Signed)
Received patient in bed to unit.  Alert and oriented.  Informed consent signed and in chart.   Treatment initiated: Pt NPO for scheduled EGD. Holding meds per NPO order.   02/03/22 0836  Vitals  Temp 97.8 F (36.6 C)  Pulse Rate 80  Resp 20  BP (!) 102/55  SpO2 97 %  O2 Device Room Air  Weight 70.2 kg  Type of Weight Pre-Dialysis  Oxygen Therapy  Patient Activity (if Appropriate) In bed  Pulse Oximetry Type Continuous  Pre Treatment  Is pt a NEW START this admission?  No  Vascular access used during treatment Fistula  Patient is receiving dialysis in a chair No  Hemodialysis Consent Verified Yes  Hemodialysis Standing Orders Initiated Yes  ECG (Telemetry) Monitor On Yes  Prime Ordered Normal Saline  Length of  DialysisTreatment -hour(s) 3.5 Hour(s)  Dialysis mode HD  Dialyzer Revaclear 300  Dialysate 2K;2.5 Ca  Dialysis Anticoagulation Automated NS Flushes  Dialysate Flow Ordered 300  Blood Flow Rate Ordered 400 mL/min  Ultrafiltration Goal 1000 Liters  Blood Pressure Parameters Keep SBP >105  Pre Treatment Labs CBC;Renal panel  Dialysis Blood Pressure Support Ordered Normal Saline      [facility]

## 2022-02-03 NOTE — Progress Notes (Signed)
OT Cancellation Note  Patient Details Name: Ruben [name] MRN: [id number] DOB: 1945-08-02   Cancelled Treatment:    Reason Eval/Treat Not Completed: Patient at procedure or test/ unavailable (HD)  Elliot Cousin 02/03/2022, 9:15 AM

## 2022-02-03 NOTE — Plan of Care (Signed)
  Problem: Clinical Measurements: Goal: Respiratory complications will improve Outcome: Progressing Goal: Cardiovascular complication will be avoided Outcome: Progressing   Problem: Activity: Goal: Risk for activity intolerance will decrease Outcome: Progressing   Problem: Coping: Goal: Level of anxiety will decrease Outcome: Progressing   Problem: Elimination: Goal: Will not experience complications related to bowel motility Outcome: Progressing   Problem: Pain Managment: Goal: General experience of comfort will improve Outcome: Progressing

## 2022-02-03 NOTE — Progress Notes (Signed)
Treatment completed:   Patient tolerated well. Unable to pull full goal d/t lower BP's Pt awaiting transport back to the room  Alert, without acute distress.  Hand-off given to patient's nurse.   Access used: L AVF Access issues: Elevated VP, had to lower BFR. Then Venous needle replaced.     02/03/22 1251  Vitals  Temp 98 F (36.7 C)  Pulse Rate 92  Resp (!) 24  BP 111/61  SpO2 96 %  O2 Device Room Air  Weight 69.6 kg  Type of Weight Post-Dialysis  Oxygen Therapy  Patient Activity (if Appropriate) In bed  Pulse Oximetry Type Continuous  Post Treatment  Dialyzer Clearance Clear  Duration of HD Treatment -hour(s) 3.5 hour(s)  Liters Processed 71.6  Fluid Removed 700 mL  Tolerated HD Treatment Yes  Post-Hemodialysis Comments Unable to reach UF goal d/t lower BP during tx  AVG/AVF Arterial Site Held (minutes) 10 minutes  AVG/AVF Venous Site Held (minutes) 10 minutes      [facility]

## 2022-02-04 ENCOUNTER — Inpatient Hospital Stay (HOSPITAL_COMMUNITY): Payer: Medicare Other | Admitting: Anesthesiology

## 2022-02-04 ENCOUNTER — Encounter (HOSPITAL_COMMUNITY)
Admission: EM | Disposition: A | Discharge status: Home / Self Care | Payer: Self-pay | Source: Home / Self Care | Attending: Family Medicine

## 2022-02-04 ENCOUNTER — Encounter (HOSPITAL_COMMUNITY): Payer: Self-pay

## 2022-02-04 DIAGNOSIS — K2270 Barrett's esophagus without dysplasia: Secondary | ICD-10-CM

## 2022-02-04 DIAGNOSIS — K921 Melena: Secondary | ICD-10-CM | POA: Diagnosis not present

## 2022-02-04 DIAGNOSIS — D62 Acute posthemorrhagic anemia: Secondary | ICD-10-CM | POA: Diagnosis not present

## 2022-02-04 DIAGNOSIS — I080 Rheumatic disorders of both mitral and aortic valves: Secondary | ICD-10-CM

## 2022-02-04 DIAGNOSIS — Z7901 Long term (current) use of anticoagulants: Secondary | ICD-10-CM | POA: Diagnosis not present

## 2022-02-04 DIAGNOSIS — K31819 Angiodysplasia of stomach and duodenum without bleeding: Secondary | ICD-10-CM

## 2022-02-04 HISTORY — PX: ESOPHAGOGASTRODUODENOSCOPY (EGD) WITH PROPOFOL: SHX5813

## 2022-02-04 HISTORY — PX: HOT HEMOSTASIS: SHX5433

## 2022-02-04 HISTORY — PX: HEMOSTASIS CLIP PLACEMENT: SHX6857

## 2022-02-04 LAB — CBC
HCT: 26.1 % — ABNORMAL LOW (ref 39.0–52.0)
Hemoglobin: 9.1 g/dL — ABNORMAL LOW (ref 13.0–17.0)
MCH: 31.7 pg (ref 26.0–34.0)
MCHC: 34.9 g/dL (ref 30.0–36.0)
MCV: 90.9 fL (ref 80.0–100.0)
Platelets: 124 10*3/uL — ABNORMAL LOW (ref 150–400)
RBC: 2.87 MIL/uL — ABNORMAL LOW (ref 4.22–5.81)
RDW: 20.3 % — ABNORMAL HIGH (ref 11.5–15.5)
WBC: 5.8 10*3/uL (ref 4.0–10.5)
nRBC: 0.3 % — ABNORMAL HIGH (ref 0.0–0.2)

## 2022-02-04 LAB — RENAL FUNCTION PANEL
Albumin: 2.7 g/dL — ABNORMAL LOW (ref 3.5–5.0)
Anion gap: 12 (ref 5–15)
BUN: 22 mg/dL (ref 8–23)
CO2: 29 mmol/L (ref 22–32)
Calcium: 8.8 mg/dL — ABNORMAL LOW (ref 8.9–10.3)
Chloride: 95 mmol/L — ABNORMAL LOW (ref 98–111)
Creatinine, Ser: 4.16 mg/dL — ABNORMAL HIGH (ref 0.61–1.24)
GFR, Estimated: 14 mL/min — ABNORMAL LOW (ref >60)
Glucose, Bld: 88 mg/dL (ref 70–99)
Phosphorus: 2.7 mg/dL (ref 2.5–4.6)
Potassium: 3.5 mmol/L (ref 3.5–5.1)
Sodium: 136 mmol/L (ref 135–145)

## 2022-02-04 LAB — PROTIME-INR
INR: 1.9 — ABNORMAL HIGH (ref 0.8–1.2)
Prothrombin Time: 22 seconds — ABNORMAL HIGH (ref 11.4–15.2)

## 2022-02-04 SURGERY — ESOPHAGOGASTRODUODENOSCOPY (EGD) WITH PROPOFOL
Anesthesia: Monitor Anesthesia Care

## 2022-02-04 MED ORDER — PROPOFOL 500 MG/50ML IV EMUL
INTRAVENOUS | Status: DC | PRN
  Administered 2022-02-04: 50 ug/kg/min via INTRAVENOUS

## 2022-02-04 MED ORDER — CHLORHEXIDINE GLUCONATE CLOTH 2 % EX PADS
6.0000 | MEDICATED_PAD | Freq: Every day | CUTANEOUS | Status: DC
  Administered 2022-02-05: 6 via TOPICAL

## 2022-02-04 MED ORDER — LACTATED RINGERS IV SOLN: INTRAVENOUS | Status: DC | PRN

## 2022-02-04 MED ORDER — APIXABAN 5 MG PO TABS
5.0000 mg | ORAL_TABLET | Freq: Two times a day (BID) | ORAL | Status: DC
  Administered 2022-02-04 – 2022-02-05 (×2): 5 mg via ORAL
  Filled 2022-02-04 (×2): qty 1

## 2022-02-04 MED ORDER — PROPOFOL 10 MG/ML IV BOLUS
INTRAVENOUS | Status: DC | PRN
  Administered 2022-02-04: 20 mg via INTRAVENOUS

## 2022-02-04 SURGICAL SUPPLY — 15 items

## 2022-02-04 NOTE — Op Note (Signed)
[facility] Patient Name: Ruben [name] Procedure Date : 02/04/2022 MRN: [id number] Attending MD: Carol Ada , MD Date of Birth: 01-Nov-1945 CSN: [id number] Age: 76 Admit Type: Inpatient Procedure:                Upper GI endoscopy Indications:              Acute post hemorrhagic anemia, Melena Providers:                Carol Ada, MD, Allayne Gitelman, RN, Fransico Setters Mbumina,                            Technician Referring MD:              Medicines:                 Complications:            No immediate complications. Estimated Blood Loss:     Estimated blood loss: none. Procedure:                Pre-Anesthesia Assessment:                           - Prior to the procedure, a History and Physical                            was performed, and patient medications and                            allergies were reviewed. The patient's tolerance of                            previous anesthesia was also reviewed. The risks                            and benefits of the procedure and the sedation                            options and risks were discussed with the patient.                            All questions were answered, and informed consent                            was obtained. Prior Anticoagulants: The patient has                            taken no previous anticoagulant or antiplatelet                            agents. ASA Grade Assessment: III - A patient with                            severe systemic disease. After reviewing the risks  and benefits, the patient was deemed in                            satisfactory condition to undergo the procedure.                           - Sedation was administered by an anesthesia                            professional. Deep sedation was attained.                           After obtaining informed consent, the endoscope was                            passed under direct vision. Throughout the                             procedure, the patient's blood pressure, pulse, and                            oxygen saturations were monitored continuously. The                            [id number]-H190 ([id number]) Olympus endoscope was introduced                            through the mouth, and advanced to the second part                            of duodenum. The upper GI endoscopy was                            accomplished without difficulty. The patient                            tolerated the procedure well. Scope In: Scope Out: Findings:      One tongue of salmon-colored mucosa was present from 38 to 40 cm. No       other visible abnormalities were present.      The stomach was normal.      A single 3 mm angiodysplastic lesion without bleeding was found in the       second portion of the duodenum. Coagulation for tissue destruction using       monopolar probe was successful. To prevent bleeding post-intervention,       one hemostatic clip was successfully placed. There was no bleeding at       the end of the procedure. Impression:               - Salmon-colored mucosa suggestive of short-segment                            Barrett's esophagus and classified as Barrett's  stage C0-M2 per Prague criteria.                           - Normal stomach.                           - A single non-bleeding angiodysplastic lesion in                            the duodenum. Treated with a monopolar probe. Clip                            was placed.                           - No specimens collected. Recommendation:           - Return patient to hospital ward for ongoing care.                           - Resume regular diet.                           - Continue present medications.                           - Okay to resume anticoagulation.                           - If HGB is stable tomorrow he can be D/C'ed home                            with a 2 week office follow  up. Procedure Code(s):        --- Professional ---                           [phone], Esophagogastroduodenoscopy, flexible,                            transoral; with ablation of tumor(s), polyp(s), or                            other lesion(s) (includes pre- and post-dilation                            and guide wire passage, when performed) Diagnosis Code(s):        --- Professional ---                           K31.819, Angiodysplasia of stomach and duodenum                            without bleeding                           K22.70, Barrett's esophagus without dysplasia  D62, Acute posthemorrhagic anemia                           K92.1, Melena (includes Hematochezia) CPT copyright 2019 [employer]. All rights reserved. The codes documented in this report are preliminary and upon coder review may  be revised to meet current compliance requirements. Carol Ada, MD Carol Ada, MD 02/04/2022 2:39:13 PM This report has been signed electronically. Number of Addenda: 0

## 2022-02-04 NOTE — Interval H&P Note (Signed)
History and Physical Interval Note:  02/04/2022 1:59 PM  Ruben [name]  has presented today for surgery, with the diagnosis of melena, anemia.  The various methods of treatment have been discussed with the patient and family. After consideration of risks, benefits and other options for treatment, the patient has consented to  Procedure(s): ESOPHAGOGASTRODUODENOSCOPY (EGD) WITH PROPOFOL (N/A) as a surgical intervention.  The patient's history has been reviewed, patient examined, no change in status, stable for surgery.  I have reviewed the patient's chart and labs.  Questions were answered to the patient's satisfaction.     Lya Holben D

## 2022-02-04 NOTE — Progress Notes (Signed)
[facility] Progress Note  Subjective: seen on dialysis, no c/o's.   Vitals:   02/04/22 1438 02/04/22 1440 02/04/22 1510 02/04/22 1516  BP: (!) 94/52 (!) 98/50 (!) 100/58 (!) 111/58  Pulse: 66 66 68 70  Resp: (!) 21 (!) 22 20 (!) 21  Temp: 98.2 F (36.8 C)     TempSrc: Temporal     SpO2: 97% 97% 95% 96%  Weight:      Height:        Exam:  alert, nad   no jvd  Chest cta bilat  Cor reg no RG  Abd soft ntnd no ascites   Ext no LE edema   Alert, NF, ox3    LUE AVG+bruit   OP HD:  MWF at [facility]  3h 9mn  400/800   70.5kg    2K/2Ca bath  P2   Hep none  LUE AVG - Mircera 369m IV q 4 week - just reordered, last got Mircera 3082mon 12/16/21. - Hgb 12.6 on 9/13, 11.2 on 9/20, 10.4 on 9/27 per outpatient lab records   Assessment/Plan:  GI Bleed: FOBT +, dark stools for several days. Hb 9s. Per GI/ pmd.   ESRD - Usual MWF schedule. Had HD here off schedule 9/30, then had HD here Monday. Next HD Wednesday.   BP/volume - BPs on low side, no edema on exam, under dry wt, keep even w/ next HD. Getting midodrine 15 po tid.   Anemia of ESRD + ABLA-  Hgb 8.8, sp aranesp here 100ug on Friday. Transfused 1U PRBC.  Metabolic bone disease- CorrCa slightly high, Phos 2.8. No VDRA as outpatient, follow.  Nutrition- Alb low, will add supps when allowed to eat.  A-fib- Eliquis on hold  Rob Drena Ham 02/04/2022, 3:31 PM   Recent Labs  Lab 02/03/22 2135 02/04/22 0024  HGB 9.2* 9.1*  ALBUMIN 2.8* 2.7*  CALCIUM 8.6* 8.8*  PHOS 2.4* 2.7  CREATININE 3.85* 4.16*  K 3.5 3.5    No results for input(s): "IRON", "TIBC", "FERRITIN" in the last 168 hours. Inpatient medications:  amiodarone  200 mg Oral Daily   atorvastatin  80 mg Oral QHS   Chlorhexidine Gluconate Cloth  6 each Topical Daily   [START ON 02/07/2022] darbepoetin (ARANESP) injection - DIALYSIS  100 mcg Intravenous Q Fri-HD   gabapentin  100 mg Oral QHS   midodrine  15 mg Oral TID WC   pantoprazole  40 mg Intravenous  Q12H   sertraline  25 mg Oral q AM   sodium chloride flush  3 mL Intravenous Q12H     acetaminophen **OR** acetaminophen, albuterol, ALPRAZolam, guaiFENesin-dextromethorphan, hydrOXYzine, nitroGLYCERIN, mouth rinse, polyvinyl alcohol

## 2022-02-04 NOTE — Progress Notes (Signed)
Transported to endoscopy by bed awake and alert.

## 2022-02-04 NOTE — Anesthesia Postprocedure Evaluation (Signed)
Anesthesia Post Note  Patient: Herbie Baltimore Junior Milan  Procedure(s) Performed: ESOPHAGOGASTRODUODENOSCOPY (EGD) WITH PROPOFOL HOT HEMOSTASIS (ARGON PLASMA COAGULATION/BICAP) HEMOSTASIS CLIP PLACEMENT     Patient location during evaluation: Endoscopy Anesthesia Type: MAC Level of consciousness: awake and alert Pain management: pain level controlled Vital Signs Assessment: post-procedure vital signs reviewed and stable Respiratory status: spontaneous breathing, nonlabored ventilation and respiratory function stable Cardiovascular status: stable and blood pressure returned to baseline Postop Assessment: no apparent nausea or vomiting Anesthetic complications: no   No notable events documented.  Last Vitals:  Vitals:   02/04/22 1438 02/04/22 1510  BP: (!) 94/52 (!) 100/58  Pulse: 66 68  Resp: (!) 21 20  Temp: 36.8 C   SpO2: 97% 95%    Last Pain:  Vitals:   02/04/22 1438  TempSrc: Temporal  PainSc: 0-No pain                 Embree Brawley,W. EDMOND

## 2022-02-04 NOTE — Evaluation (Signed)
Occupational Therapy Evaluation Patient Details Name: Ruben [name] MRN: [id number] DOB: 02-19-1946 Today's Date: 02/04/2022   History of Present Illness Pt is a 76 y/o M who presented to [facility] on 01/31/22 with c/o dark tarry stool. abdominal cramps/discomfort, and early satiety. Admitted for acute upper GI bleeding. Signicant PMH of ESRD, chronic HFrEF, paroxysmal afib, EtOH and tobacco abuse, HTN, HLD, depression, mitral valve regurgiation not a candidate for mitraclip, MI, and history of stress test and echocardiogram.   Clinical Impression   Ruben [name] was evaluated s/p the above admission list, he is typically mod I at baseline with minimal assist for ADLs if needed and family completes IADLs. Upon evaluation pt had functional limitations due to generalized weakness, 2L O2 requirement and decreased activity tolerance. Overall he required min G- supervision for mobility with SPC and up to min G for ADLs. He will benefit from OT acutely. Recommend d/c to home with [place].    Recommendations for follow up therapy are one component of a multi-disciplinary discharge planning process, led by the attending physician.  Recommendations may be updated based on patient status, additional functional criteria and insurance authorization.   Follow Up Recommendations  Home health OT    Assistance Recommended at Discharge Intermittent Supervision/Assistance  Patient can return home with the following A little help with walking and/or transfers;A little help with bathing/dressing/bathroom;Assistance with cooking/housework;Assist for transportation;Help with stairs or ramp for entrance    Functional Status Assessment  Patient has had a recent decline in their functional status and demonstrates the ability to make significant improvements in function in a reasonable and predictable amount of time.  Equipment Recommendations  Tub/shower seat       Precautions / Restrictions Precautions Precautions:  Fall Restrictions Weight Bearing Restrictions: No      Mobility Bed Mobility Overal bed mobility: Needs Assistance Bed Mobility: Supine to Sit     Supine to sit: Supervision          Transfers Overall transfer level: Needs assistance Equipment used: Straight cane Transfers: Sit to/from Stand Sit to Stand: Min guard                  Balance Overall balance assessment: Needs assistance Sitting-balance support: No upper extremity supported, Feet supported Sitting balance-[name] Scale: Good     Standing balance support: Bilateral upper extremity supported, During functional activity, Single extremity supported, No upper extremity supported Standing balance-[name] Scale: Fair                             ADL either performed or assessed with clinical judgement   ADL Overall ADL's : Needs assistance/impaired Eating/Feeding: Independent;Sitting   Grooming: Supervision/safety;Standing   Upper Body Bathing: Set up;Sitting   Lower Body Bathing: Min guard;Sit to/from stand   Upper Body Dressing : Set up;Sitting   Lower Body Dressing: Min guard;Sit to/from stand   Toilet Transfer: Min guard;Ambulation   Toileting- Clothing Manipulation and Hygiene: Supervision/safety;Sitting/lateral lean       Functional mobility during ADLs: Min guard;Cane General ADL Comments: limited by activity tolerance, generalized weakness     Vision Baseline Vision/History: 0 No visual deficits Vision Assessment?: No apparent visual deficits     Perception     Praxis      Pertinent Vitals/Pain Pain Assessment Pain Assessment: Faces Faces Pain Scale: No hurt Pain Intervention(s): Monitored during session     Hand Dominance Right   Extremity/Trunk Assessment Upper Extremity Assessment Upper Extremity  Assessment: Generalized weakness   Lower Extremity Assessment Lower Extremity Assessment: Defer to PT evaluation   Cervical / Trunk Assessment Cervical / Trunk  Assessment: Kyphotic (mild)   Communication Communication Communication: No difficulties   Cognition Arousal/Alertness: Awake/alert Behavior During Therapy: Flat affect Overall Cognitive Status: Within Functional Limits for tasks assessed Area of Impairment: Awareness, Problem solving                           Awareness: Emergent Problem Solving: Slow processing General Comments: Pt alert and oriented requiring mild increase in time for processing     General Comments  upon sitting pt satting at 89% on RA. 2L donned for mobility. 92-94% with 2L            Home Living Family/patient expects to be discharged to:: Private residence Living Arrangements: Spouse/significant other Available Help at Discharge: Family Type of Home: House Home Access: Stairs to enter Technical brewer of Steps: 2 Entrance Stairs-Rails: Can reach both Home Layout: One level     Bathroom Shower/Tub: Tub/shower unit;Door         Home Equipment: Conservation officer, nature (2 wheels);Rollator (4 wheels);Cane - single point;Toilet riser   Additional Comments: Pts daughter lives within 2 miles of the home      Prior Functioning/Environment Prior Level of Function : Needs assist         Mobility (physical): Gait   Mobility Comments: Pt typically ambulates with SPC, prefers not to use it but aware that it dereases safety. Reports 1 fall in last 6 months. ADLs Comments: Some assist with ADLs from wife if needed. Pts spouse completed most IADLs and consistent delivery of food from friends/family        OT Problem List: Decreased strength;Decreased range of motion;Decreased activity tolerance;Cardiopulmonary status limiting activity;Decreased safety awareness      OT Treatment/Interventions: Self-care/ADL training;Therapeutic exercise;DME and/or AE instruction;Therapeutic activities;Patient/family education;Balance training    OT Goals(Current goals can be found in the care plan section)  Acute Rehab OT Goals Patient Stated Goal: home OT Goal Formulation: With patient Time For Goal Achievement: 02/18/22 Potential to Achieve Goals: Good ADL Goals Pt Will Perform Lower Body Dressing: Independently;sit to/from stand Pt Will Transfer to Toilet: Independently;ambulating;regular height toilet Additional ADL Goal #1: Pt will indep recall at least 3 energy conservation techniques to apply at discharge  OT Frequency: Min 2X/week       AM-PAC OT "6 Clicks" Daily Activity     Outcome Measure Help from another person eating meals?: None Help from another person taking care of personal grooming?: A Little Help from another person toileting, which includes using toliet, bedpan, or urinal?: A Little Help from another person bathing (including washing, rinsing, drying)?: A Little Help from another person to put on and taking off regular upper body clothing?: None Help from another person to put on and taking off regular lower body clothing?: A Little 6 Click Score: 20   End of Session Equipment Utilized During Treatment: Gait belt;Oxygen Nurse Communication: Mobility status  Activity Tolerance: Patient tolerated treatment well Patient left: in chair;with call bell/phone within reach;with family/visitor present;with nursing/sitter in room  OT Visit Diagnosis: Unsteadiness on feet (R26.81);Other abnormalities of gait and mobility (R26.89);Muscle weakness (generalized) (M62.81)                Time: 2979-8921 OT Time Calculation (min): 18 min Charges:  OT General Charges $OT Visit: 1 Visit OT Evaluation $OT Eval Moderate Complexity: 1  Mod   Elliot Cousin 02/04/2022, 1:07 PM

## 2022-02-04 NOTE — Progress Notes (Signed)
Back from Endo by bed awake and alert.

## 2022-02-04 NOTE — Transfer of Care (Signed)
Immediate Anesthesia Transfer of Care Note  Patient: Ruben [name]  Procedure(s) Performed: ESOPHAGOGASTRODUODENOSCOPY (EGD) WITH PROPOFOL HOT HEMOSTASIS (ARGON PLASMA COAGULATION/BICAP) HEMOSTASIS CLIP PLACEMENT  Patient Location: PACU and Endoscopy Unit  Anesthesia Type:MAC  Level of Consciousness: drowsy and patient cooperative  Airway & Oxygen Therapy: Patient Spontanous Breathing and Patient connected to nasal cannula oxygen  Post-op Assessment: Report given to RN and Post -op Vital signs reviewed and stable  Post vital signs: Reviewed and stable  Last Vitals:  Vitals Value Taken Time  BP 97/50   Temp    Pulse 66 02/04/22 1439  Resp 21 02/04/22 1439  SpO2 98 % 02/04/22 1439  Vitals shown include unvalidated device data.  Last Pain:  Vitals:   02/04/22 1321  TempSrc: Temporal  PainSc: 0-No pain      Patients Stated Pain Goal: 0 (94/32/76 1470)  Complications: No notable events documented.

## 2022-02-04 NOTE — Progress Notes (Signed)
Progress Note  Patient: Ruben [name] BOF:[id number] DOB: [birth year]  DOA: 01/31/2022  DOS: 02/04/2022    Brief hospital course: Herbie Baltimore Junior Kenneth is a 76 y.o. male with medical history of ESRD, chronic HFrEF, PAF, EtOH and tobacco abuse, HTN, HLD, MR not a candidate for mitraclip, depression who presented to the ED with  onset dark tarry stools and crampy abdominal discomfort, early satiety.   In the ED BP soft at 104/71, HR 78, afebrile, with +FOBT, hgb down to 8.8g/dl from 12.6 in June, 10.9 in July. Platelets 128k (baseline 140-150's). Metabolic profile concerning for hyperkalemia (K 5.9) and mild LFT elevations (AST 43, ALT 47, TBili 1.8). Hemodialysis was performed with improvement in metabolic profile. GI was consulted and plans on EGD. INR is 4.8 and down to 2.9 > 2.2 with eliquis being held. No active ongoing bleeding noted. Hgb 8.7.   Assessment and Plan: ABLA on anemia of ESRD suspected upper GI bleed with melena:  - GI consulted, delayed EGD for eliquis washout, INR now < 2. Anticipate EGD today. - PPI gtt - Hgb 8.8 >> 9.1 after 2u PRBCs given.  - Holding eliquis.   - ESA given 9/30.    Coagulopathy: INR elevated in setting of DOAC. Improving with holding DOAC, now down to 1.9 on 10/3.   ESRD with hyperkalemia: Pt on iHD since admit Dec 2022 with cardiogenic shock. Hyperkalemia resolved.  - HD 9/30 after missing 9/29 per nephrology. MWF schedule typically, dialyzing 10/2.    Chronic HFrEF: TEE 06/23: EF 25-30%, RV mildly reduced, severe ischemic MR. Followed by Dr. Aundra Dubin of [facility] - Check weights, monitor I/O - Hypotension and ESRD limiting GDMT. Manage volume with HD.   CAD: No angina at this time.  - Continue statin. Holding anticoagulation/antiplatelet with GI bleed.    PAF: Maintaining NSR since DCCV Jan 2023 - Continue amiodarone '100mg'$  daily.  - Monitor heart rate/rhythm on telemetry.  - Hold eliquis with concern for GI bleeding   Wheezing: No Hx COPD  noted - Continue nebs prn - Formal PFTs recommended after follow up  Elevated TSH: And elevated free T4, both mild.  - Recheck at follow up is suggested.  MR: Michela Pitcher to be poor candidate for MitraClip per cardiology previously.    EtOH abuse: Quit last year   Tobacco use: Quit. Cessation counseling provided.    Depression, anxiety: - Continue lowest dose benzo prn and hydroxyzine prn.  - Continue SSRI   Hypoalbuminemia:  - RD consult   Hyperbilirubinemia: Has been noted off and on chronically.    HLD: Well controlled with statin. HDL 36, LDL 70.  - Continue statin   LFT elevations: Mild, nonspecific, may be due to amiodarone.  - Will monitor.    Prolonged QT interval.  - Monitor telemetry, avoid provocative agents, correct metabolic derangements.    Hypocalcemia: Improved.    Thrombocytopenia: Chronic, mild.  - Stable, continue monitoring with Hgb.   Subjective: Coughing more, had some wheezing improved with nebs, vomiting a bit this AM. Had BM without red blood in it yesterday per spouse. Pt without abd pain. Work with PT yesterday.  Objective: Vitals:   02/03/22 2201 02/04/22 0012 02/04/22 0412 02/04/22 0826  BP:  (!) 96/51 (!) 93/52 (!) 105/59  Pulse: 81   78  Resp: (!) 22   19  Temp:  98 F (36.7 C) 97.8 F (36.6 C) 98.2 F (36.8 C)  TempSrc:  Oral Oral Oral  SpO2: 96%   95%  Weight:  69.8 kg   Height:       Gen: 76 y.o. male in no distress Pulm: Nonlabored breathing. Clear. CV: Regular rate and rhythm. II/VI systolic, stable murmur, without rub, or gallop. No JVD, no significant dependent edema. GI: Abdomen soft, non-tender, non-distended, with normoactive bowel sounds.  Ext: Warm, no deformities Skin: No rashes, lesions or ulcers on visualized skin. Neuro: Alert and oriented. No focal neurological deficits. Psych: Judgement and insight appear fair. Mood euthymic & affect congruent. Behavior is appropriate.    Data Personally reviewed: CBC: Recent  Labs  Lab 01/31/22 2151 01/31/22 2352 02/02/22 1821 02/03/22 0014 02/03/22 0723 02/03/22 2135 02/04/22 0024  WBC 5.6  --   --   --  5.5 6.1 5.8  HGB 8.8*   < > 8.9* 9.2* 8.7* 9.2* 9.1*  HCT 25.8*   < > 25.6* 27.2* 25.5* 26.1* 26.1*  MCV 94.9  --   --   --  92.1 90.3 90.9  PLT 128*  --   --   --  122* 143* 124*   < > = values in this interval not displayed.   Basic Metabolic Panel: Recent Labs  Lab 02/01/22 1230 02/02/22 0607 02/03/22 0723 02/03/22 2135 02/04/22 0024  NA 139 137 138 133* 136  K 5.8* 4.2 4.1 3.5 3.5  CL 97* 97* 97* 95* 95*  CO2 '26 31 29 26 29  '$ GLUCOSE 95 85 99 93 88  BUN 78* 38* 49* 20 22  CREATININE 8.20* 5.02* 6.71* 3.85* 4.16*  CALCIUM 9.8 8.7* 9.0 8.6* 8.8*  PHOS 2.8 2.4*  --  2.4* 2.7   GFR: Estimated Creatinine Clearance: 14.9 mL/min (A) (by C-G formula based on SCr of 4.16 mg/dL (H)). Liver Function Tests: Recent Labs  Lab 01/31/22 2151 02/01/22 1230 02/02/22 0607 02/03/22 0723 02/03/22 2135 02/04/22 0024  AST 43*  --   --  31  --   --   ALT 47*  --   --  33  --   --   ALKPHOS 68  --   --  60  --   --   BILITOT 1.8*  --   --  1.5*  --   --   PROT 6.2*  --   --  5.2*  --   --   ALBUMIN 3.0* 2.7* 2.5* 2.6* 2.8* 2.7*   No results for input(s): "LIPASE", "AMYLASE" in the last 168 hours. No results for input(s): "AMMONIA" in the last 168 hours. Coagulation Profile: Recent Labs  Lab 02/01/22 1230 02/02/22 0607 02/03/22 0723 02/03/22 2135 02/04/22 0024  INR 4.6* 2.9* 2.2* 2.0* 1.9*   Cardiac Enzymes: No results for input(s): "CKTOTAL", "CKMB", "CKMBINDEX", "TROPONINI" in the last 168 hours. BNP (last 3 results) No results for input(s): "PROBNP" in the last 8760 hours. HbA1C: No results for input(s): "HGBA1C" in the last 72 hours. CBG: No results for input(s): "GLUCAP" in the last 168 hours. Lipid Profile: No results for input(s): "CHOL", "HDL", "LDLCALC", "TRIG", "CHOLHDL", "LDLDIRECT" in the last 72 hours. Thyroid Function  Tests: Recent Labs    02/01/22 1230  FREET4 1.18*   Anemia Panel: No results for input(s): "VITAMINB12", "FOLATE", "FERRITIN", "TIBC", "IRON", "RETICCTPCT" in the last 72 hours. Urine analysis:    Component Value Date/Time   COLORURINE YELLOW 05/14/2021 1842   APPEARANCEUR CLEAR 05/14/2021 1842   LABSPEC >1.030 (H) 05/14/2021 1842   PHURINE 5.5 05/14/2021 1842   GLUCOSEU NEGATIVE 05/14/2021 1842   HGBUR NEGATIVE 05/14/2021 1842   BILIRUBINUR NEGATIVE  05/14/2021 1842   BILIRUBINUR negative 01/23/2020 1028   KETONESUR NEGATIVE 05/14/2021 1842   PROTEINUR NEGATIVE 05/14/2021 1842   UROBILINOGEN 1.0 01/23/2020 1028   NITRITE NEGATIVE 05/14/2021 1842   LEUKOCYTESUR NEGATIVE 05/14/2021 1842   Recent Results (from the past 240 hour(s))  MRSA Next Gen by PCR, Nasal     Status: None   Collection Time: 02/02/22  1:10 AM   Specimen: Nasal Mucosa; Nasal Swab  Result Value Ref Range Status   MRSA by PCR Next Gen NOT DETECTED NOT DETECTED Final    Comment: (NOTE) The GeneXpert MRSA Assay (FDA approved for NASAL specimens only), is one component of a comprehensive MRSA colonization surveillance program. It is not intended to diagnose MRSA infection nor to guide or monitor treatment for MRSA infections. Test performance is not FDA approved in patients less than 17 years old. Performed at [facility], [place] [address] [place], [place] [address]      Family Communication: Spouse at bedside  Disposition: Status is: Inpatient Remains inpatient appropriate because: Needs EGD and stable hgb Planned Discharge Destination:  TBD  Patrecia Pour, MD 02/04/2022 11:35 AM Page by [email or web address]

## 2022-02-04 NOTE — Anesthesia Preprocedure Evaluation (Addendum)
Anesthesia Evaluation  Patient identified by MRN, date of birth, ID band Patient awake    Reviewed: Allergy & Precautions, H&P , NPO status , Patient's Chart, lab work & pertinent test results  Airway Mallampati: II  TM Distance: >3 FB Neck ROM: Full    Dental no notable dental hx. (+) Upper Dentures, Dental Advisory Given   Pulmonary neg pulmonary ROS, former smoker,    Pulmonary exam normal breath sounds clear to auscultation       Cardiovascular hypertension, Pt. on medications + CAD, + Past MI and +CHF  negative cardio ROS  + Valvular Problems/Murmurs MR and AI  Rhythm:Regular Rate:Normal     Neuro/Psych Anxiety negative neurological ROS     GI/Hepatic negative GI ROS, Neg liver ROS,   Endo/Other  negative endocrine ROS  Renal/GU ESRF and DialysisRenal disease  negative genitourinary   Musculoskeletal  (+) Arthritis , Osteoarthritis,    Abdominal   Peds  Hematology  (+) Blood dyscrasia, anemia ,   Anesthesia Other Findings   Reproductive/Obstetrics negative OB ROS                            Anesthesia Physical Anesthesia Plan  ASA: 4  Anesthesia Plan: MAC   Post-op Pain Management: Minimal or no pain anticipated   Induction: Intravenous  PONV Risk Score and Plan: 1 and Propofol infusion  Airway Management Planned: Natural Airway and Nasal Cannula  Additional Equipment:   Intra-op Plan:   Post-operative Plan:   Informed Consent: I have reviewed the patients History and Physical, chart, labs and discussed the procedure including the risks, benefits and alternatives for the proposed anesthesia with the patient or authorized representative who has indicated his/her understanding and acceptance.     Dental advisory given  Plan Discussed with: CRNA  Anesthesia Plan Comments:         Anesthesia Quick Evaluation

## 2022-02-05 ENCOUNTER — Ambulatory Visit (HOSPITAL_COMMUNITY): Payer: Medicare Other

## 2022-02-05 DIAGNOSIS — Z7901 Long term (current) use of anticoagulants: Secondary | ICD-10-CM

## 2022-02-05 DIAGNOSIS — E875 Hyperkalemia: Secondary | ICD-10-CM

## 2022-02-05 DIAGNOSIS — K921 Melena: Secondary | ICD-10-CM

## 2022-02-05 DIAGNOSIS — K2270 Barrett's esophagus without dysplasia: Secondary | ICD-10-CM

## 2022-02-05 LAB — CBC
HCT: 29.3 % — ABNORMAL LOW (ref 39.0–52.0)
Hemoglobin: 9.8 g/dL — ABNORMAL LOW (ref 13.0–17.0)
MCH: 31.5 pg (ref 26.0–34.0)
MCHC: 33.4 g/dL (ref 30.0–36.0)
MCV: 94.2 fL (ref 80.0–100.0)
Platelets: 154 K/uL (ref 150–400)
RBC: 3.11 MIL/uL — ABNORMAL LOW (ref 4.22–5.81)
RDW: 20.7 % — ABNORMAL HIGH (ref 11.5–15.5)
WBC: 6.1 K/uL (ref 4.0–10.5)
nRBC: 0.5 % — ABNORMAL HIGH (ref 0.0–0.2)

## 2022-02-05 LAB — RENAL FUNCTION PANEL
Albumin: 2.8 g/dL — ABNORMAL LOW (ref 3.5–5.0)
Anion gap: 13 (ref 5–15)
BUN: 28 mg/dL — ABNORMAL HIGH (ref 8–23)
CO2: 27 mmol/L (ref 22–32)
Calcium: 9.1 mg/dL (ref 8.9–10.3)
Chloride: 95 mmol/L — ABNORMAL LOW (ref 98–111)
Creatinine, Ser: 5.54 mg/dL — ABNORMAL HIGH (ref 0.61–1.24)
GFR, Estimated: 10 mL/min — ABNORMAL LOW
Glucose, Bld: 100 mg/dL — ABNORMAL HIGH (ref 70–99)
Phosphorus: 4.2 mg/dL (ref 2.5–4.6)
Potassium: 4.2 mmol/L (ref 3.5–5.1)
Sodium: 135 mmol/L (ref 135–145)

## 2022-02-05 LAB — PROTIME-INR
INR: 1.9 — ABNORMAL HIGH (ref 0.8–1.2)
Prothrombin Time: 21.3 seconds — ABNORMAL HIGH (ref 11.4–15.2)

## 2022-02-05 MED ORDER — PANTOPRAZOLE SODIUM 40 MG PO TBEC: 40.0000 mg | DELAYED_RELEASE_TABLET | Freq: Two times a day (BID) | ORAL | 1 refills | Status: DC

## 2022-02-05 MED ORDER — PANTOPRAZOLE SODIUM 40 MG PO TBEC
40.0000 mg | DELAYED_RELEASE_TABLET | Freq: Two times a day (BID) | ORAL | Status: DC
  Administered 2022-02-05: 40 mg via ORAL

## 2022-02-05 NOTE — Progress Notes (Signed)
PT Cancellation Note  Patient Details Name: Ruben [name] MRN: [id number] DOB: 1945/05/15   Cancelled Treatment:    Reason Eval/Treat Not Completed: Patient at procedure or test/unavailable, pt off unit at HD. Will check back as schedule allows to continue with PT POC.  Audry Riles. PTA Acute Rehabilitation Services Office: [place] 02/05/2022, 10:53 AM

## 2022-02-05 NOTE — Plan of Care (Signed)
  Problem: Clinical Measurements: Goal: Respiratory complications will improve Outcome: Progressing Goal: Cardiovascular complication will be avoided Outcome: Progressing   Problem: Activity: Goal: Risk for activity intolerance will decrease Outcome: Progressing   Problem: Elimination: Goal: Will not experience complications related to bowel motility Outcome: Progressing Goal: Will not experience complications related to urinary retention Outcome: Progressing   

## 2022-02-05 NOTE — Care Management Important Message (Signed)
Important Message  Patient Details  Name: Ruben [name] MRN: [id number] Date of Birth: [birth year]   Medicare Important Message Given:  Yes     [name] 02/05/2022, 3:03 PM

## 2022-02-05 NOTE — Progress Notes (Signed)
Received patient in bed to unit.  Alert and oriented.  Informed consent signed and in chart.   Treatment initiated: 1032 Treatment completed: 1414  Patient tolerated well.  Transported back to the room  Alert, without acute distress.  Hand-off given to patient's nurse.   Access used: fistula Access issues: none  Total UF removed: none Medication(s) given: none Post HD VS: 97.3, 109/58(74), HR-76, RR-16, SP02-93 Post HD weight: 71.3kg   [facility]

## 2022-02-05 NOTE — Discharge Summary (Signed)
Physician Discharge Summary  Ruben [name] TKZ:[id number] DOB: October 22, 1945 DOA: 01/31/2022  PCP: [name], MD  Admit date: 01/31/2022 Discharge date: 02/05/2022  Admitted From: Home Disposition: Home  Recommendations for Outpatient Follow-up:  Follow up with PCP in 1 week Follow up with Dr. Benson Norway in 2 weeks  Discharge Condition: Stable CODE STATUS: Full code Diet recommendation: Renal diet  Brief/Interim Summary: Ruben [name] is a 76 y.o. male with medical history of ESRD, chronic HFrEF, PAF, EtOH and tobacco abuse, HTN, HLD, MR not a candidate for mitraclip, depression who presented to the ED with  onset dark tarry stools and crampy abdominal discomfort, early satiety.    In the ED BP soft at 104/71, HR 78, afebrile, with +FOBT, hgb down to 8.8g/dl from 12.6 in June, 10.9 in July. Platelets 128k (baseline 140-150's). Metabolic profile concerning for hyperkalemia (K 5.9) and mild LFT elevations (AST 43, ALT 47, TBili 1.8). Hemodialysis was performed with improvement in metabolic profile. GI was consulted and plans on EGD. INR is 4.8 and down to 2.9 > 2.2 with eliquis being held. No active ongoing bleeding noted. Hgb 8.7.    Patient was given PPI drip as well as 2 unit packed red blood cell transfusions and ESA.  He underwent EGD which revealed Barrett's esophagus and single nonbleeding angiodysplastic lesion which was treated with probe and clip placement.  Hemoglobin remained stable.  Patient was discharged home in stable condition.  Discharge Diagnoses:   Principal Problem:   Acute GI bleeding Active Problems:   ESRD on hemodialysis (HCC)   HTN (hypertension)   HLD (hyperlipidemia)   Paroxysmal atrial fibrillation (HCC)   Chronic systolic CHF (congestive heart failure) (HCC)   Transaminitis   CAD (coronary artery disease)   Anxiety and Depression   Mitral regurgitation   Melena   Barrett esophagus   Anticoagulant long-term use   Hyperkalemia  Discharge  Instructions  Discharge Instructions     Call MD for:   Complete by: As directed    Dark or bloody stool   Call MD for:  difficulty breathing, headache or visual disturbances   Complete by: As directed    Call MD for:  extreme fatigue   Complete by: As directed    Call MD for:  persistant dizziness or light-headedness   Complete by: As directed    Call MD for:  persistant nausea and vomiting   Complete by: As directed    Call MD for:  severe uncontrolled pain   Complete by: As directed    Call MD for:  temperature >100.4   Complete by: As directed    Discharge instructions   Complete by: As directed    You were cared for by a hospitalist during your hospital stay. If you have any questions about your discharge medications or the care you received while you were in the hospital after you are discharged, you can call the unit and ask to speak with the hospitalist on call if the hospitalist that took care of you is not available. Once you are discharged, your primary care physician will handle any further medical issues. Please note that NO REFILLS for any discharge medications will be authorized once you are discharged, as it is imperative that you return to your primary care physician (or establish a relationship with a primary care physician if you do not have one) for your aftercare needs so that they can reassess your need for medications and monitor your lab values.  Increase activity slowly   Complete by: As directed       Allergies as of 02/05/2022       Reactions   Other Other (See Comments)   Pollen - sneezing, itching/watery eyes        Medication List     STOP taking these medications    omeprazole 40 MG capsule Commonly known as: PRILOSEC   traZODone 100 MG tablet Commonly known as: DESYREL       TAKE these medications    acetaminophen 650 MG CR tablet Commonly known as: TYLENOL Take 1,300 mg by mouth every 8 (eight) hours as needed for pain.    albuterol (2.5 MG/3ML) 0.083% nebulizer solution Commonly known as: PROVENTIL Take 3 mLs (2.5 mg total) by nebulization every 4 (four) hours as needed for wheezing or shortness of breath.   ALPRAZolam 0.25 MG tablet Commonly known as: Xanax Take 1 tablet (0.25 mg total) by mouth daily as needed for anxiety (anxiety with dialysis).   amiodarone 200 MG tablet Commonly known as: PACERONE Take 1 tablet (200 mg total) by mouth daily.   apixaban 5 MG Tabs tablet Commonly known as: ELIQUIS Take 1 tablet (5 mg total) by mouth 2 (two) times daily.   atorvastatin 80 MG tablet Commonly known as: LIPITOR Take 1 tablet (80 mg total) by mouth daily. What changed: when to take this   cholecalciferol 25 MCG (1000 UNIT) tablet Commonly known as: VITAMIN D3 Take 1,000 Units by mouth in the morning.   ethyl chloride spray Apply 1 Application topically as needed (prior to dialysis).   gabapentin 100 MG capsule Commonly known as: NEURONTIN Take 100 mg by mouth at bedtime.   hydrocortisone 25 MG suppository Commonly known as: ANUSOL-HC Place 1 suppository (25 mg total) rectally 2 (two) times daily. What changed:  when to take this reasons to take this   hydrOXYzine 10 MG tablet Commonly known as: ATARAX Take 10 mg by mouth at bedtime as needed for anxiety.   loratadine 10 MG tablet Commonly known as: CLARITIN Take 10 mg by mouth every other day.   magnesium oxide 400 (240 Mg) MG tablet Commonly known as: MAG-OX Take 400 mg by mouth daily in the afternoon.   melatonin 5 MG Tabs Take 5-10 mg by mouth at bedtime as needed (sleep).   metoCLOPramide 5 MG tablet Commonly known as: REGLAN Take 1 tablet (5 mg total) by mouth every 8 (eight) hours as needed for nausea.   midodrine 10 MG tablet Commonly known as: PROAMATINE Take 1.5 tablets (15 mg total) by mouth 3 (three) times daily with meals.   MiraLax 17 GM/SCOOP powder Generic drug: polyethylene glycol powder Take 17 g by  mouth daily. Make be given every other day as well   nitroGLYCERIN 0.4 MG SL tablet Commonly known as: Nitrostat Place 1 tablet (0.4 mg total) under the tongue every 5 (five) minutes as needed for chest pain.   NONFORMULARY OR COMPOUNDED ITEM Place 1 Application rectally 2 (two) times daily as needed for hemorrhoids. Diltiazem 2% Ointment   ondansetron 4 MG tablet Commonly known as: Zofran Take 1 tablet (4 mg total) by mouth daily as needed for nausea or vomiting.   pantoprazole 40 MG tablet Commonly known as: PROTONIX Take 1 tablet (40 mg total) by mouth 2 (two) times daily.   phenylephrine 0.25 % suppository Commonly known as: (USE for PREPARATION-H) Place 1 suppository rectally 2 (two) times daily as needed for hemorrhoids.   prochlorperazine 5 MG  tablet Commonly known as: COMPAZINE Take 5 mg by mouth 3 (three) times daily as needed for nausea or vomiting.   sertraline 25 MG tablet Commonly known as: ZOLOFT Take 25 mg by mouth in the morning.   Systane Complete 0.6 % Soln Generic drug: Propylene Glycol Place 1 drop into both eyes 3 (three) times daily as needed (dry/irritated eyes.).        Follow-up Information     Pahwani, Rinka R, MD Follow up in 1 week(s).   Specialty: Internal Medicine Contact information: [address] [place] [address] [phone]         Carol Ada, MD. Schedule an appointment as soon as possible for a visit in 2 week(s).   Specialty: Gastroenterology Contact information: [address], [place] [address] [phone]                Allergies  Allergen Reactions   Other Other (See Comments)    Pollen - sneezing, itching/watery eyes    Consultations: GI   Procedures/Studies: EGD 10/3 Impression:       - Salmon-colored mucosa suggestive of short-segment                            Barrett's esophagus and classified as Barrett's                            stage C0-M2 per  [place] criteria.                           - Normal stomach.                           - A single non-bleeding angiodysplastic lesion in                            the duodenum. Treated with a monopolar probe. Clip                            was placed.   Discharge Exam: Vitals:   02/05/22 1032 02/05/22 1100  BP: (!) 94/54 (!) 93/50  Pulse: 71 67  Resp: (!) 25 18  Temp:    SpO2: 93% 94%    General: Pt is alert, awake, not in acute distress Cardiovascular: RRR, S1/S2 +, no edema Respiratory: CTA bilaterally, no wheezing, no rhonchi, no respiratory distress, no conversational dyspnea  Abdominal: Soft, NT, ND, bowel sounds + Extremities: no edema, no cyanosis Psych: Normal mood and affect, stable judgement and insight     The results of significant diagnostics from this hospitalization (including imaging, microbiology, ancillary and laboratory) are listed below for reference.     Microbiology: Recent Results (from the past 240 hour(s))  MRSA Next Gen by PCR, Nasal     Status: None   Collection Time: 02/02/22  1:10 AM   Specimen: Nasal Mucosa; Nasal Swab  Result Value Ref Range Status   MRSA by PCR Next Gen NOT DETECTED NOT DETECTED Final    Comment: (NOTE) The GeneXpert MRSA Assay (FDA approved for NASAL specimens only), is one component of a comprehensive MRSA colonization surveillance program. It is not intended to diagnose MRSA infection nor to guide or monitor treatment for MRSA infections. Test performance  is not FDA approved in patients less than 83 years old. Performed at [facility], [place] [address] [place], [place] [address]      Labs: BNP (last 3 results) Recent Labs    04/29/21 1251 06/02/21 0958 06/14/21 0041  BNP 1,659.6* 3,498.9* >0,300.9*   Basic Metabolic Panel: Recent Labs  Lab 02/01/22 1230 02/02/22 0607 02/03/22 0723 02/03/22 2135 02/04/22 0024 02/05/22 0028  NA 139 137 138 133* 136 135  K 5.8* 4.2 4.1 3.5 3.5 4.2  CL 97* 97*  97* 95* 95* 95*  CO2 '26 31 29 26 29 27  '$ GLUCOSE 95 85 99 93 88 100*  BUN 78* 38* 49* 20 22 28*  CREATININE 8.20* 5.02* 6.71* 3.85* 4.16* 5.54*  CALCIUM 9.8 8.7* 9.0 8.6* 8.8* 9.1  PHOS 2.8 2.4*  --  2.4* 2.7 4.2   Liver Function Tests: Recent Labs  Lab 01/31/22 2151 02/01/22 1230 02/02/22 0607 02/03/22 0723 02/03/22 2135 02/04/22 0024 02/05/22 0028  AST 43*  --   --  31  --   --   --   ALT 47*  --   --  33  --   --   --   ALKPHOS 68  --   --  60  --   --   --   BILITOT 1.8*  --   --  1.5*  --   --   --   PROT 6.2*  --   --  5.2*  --   --   --   ALBUMIN 3.0*   < > 2.5* 2.6* 2.8* 2.7* 2.8*   < > = values in this interval not displayed.   No results for input(s): "LIPASE", "AMYLASE" in the last 168 hours. No results for input(s): "AMMONIA" in the last 168 hours. CBC: Recent Labs  Lab 01/31/22 2151 01/31/22 2352 02/03/22 0014 02/03/22 0723 02/03/22 2135 02/04/22 0024 02/05/22 0028  WBC 5.6  --   --  5.5 6.1 5.8 6.1  HGB 8.8*   < > 9.2* 8.7* 9.2* 9.1* 9.8*  HCT 25.8*   < > 27.2* 25.5* 26.1* 26.1* 29.3*  MCV 94.9  --   --  92.1 90.3 90.9 94.2  PLT 128*  --   --  122* 143* 124* 154   < > = values in this interval not displayed.   Cardiac Enzymes: No results for input(s): "CKTOTAL", "CKMB", "CKMBINDEX", "TROPONINI" in the last 168 hours. BNP: Invalid input(s): "POCBNP" CBG: No results for input(s): "GLUCAP" in the last 168 hours. D-Dimer No results for input(s): "DDIMER" in the last 72 hours. Hgb A1c No results for input(s): "HGBA1C" in the last 72 hours. Lipid Profile No results for input(s): "CHOL", "HDL", "LDLCALC", "TRIG", "CHOLHDL", "LDLDIRECT" in the last 72 hours. Thyroid function studies No results for input(s): "TSH", "T4TOTAL", "T3FREE", "THYROIDAB" in the last 72 hours.  Invalid input(s): "FREET3" Anemia work up No results for input(s): "VITAMINB12", "FOLATE", "FERRITIN", "TIBC", "IRON", "RETICCTPCT" in the last 72 hours. Urinalysis    Component  Value Date/Time   COLORURINE YELLOW 05/14/2021 1842   APPEARANCEUR CLEAR 05/14/2021 1842   LABSPEC >1.030 (H) 05/14/2021 1842   PHURINE 5.5 05/14/2021 1842   GLUCOSEU NEGATIVE 05/14/2021 1842   HGBUR NEGATIVE 05/14/2021 1842   BILIRUBINUR NEGATIVE 05/14/2021 1842   BILIRUBINUR negative 01/23/2020 1028   KETONESUR NEGATIVE 05/14/2021 1842   PROTEINUR NEGATIVE 05/14/2021 1842   UROBILINOGEN 1.0 01/23/2020 1028   NITRITE NEGATIVE 05/14/2021 1842   LEUKOCYTESUR NEGATIVE 05/14/2021 1842  Sepsis Labs Recent Labs  Lab 02/03/22 0723 02/03/22 2135 02/04/22 0024 02/05/22 0028  WBC 5.5 6.1 5.8 6.1   Microbiology Recent Results (from the past 240 hour(s))  MRSA Next Gen by PCR, Nasal     Status: None   Collection Time: 02/02/22  1:10 AM   Specimen: Nasal Mucosa; Nasal Swab  Result Value Ref Range Status   MRSA by PCR Next Gen NOT DETECTED NOT DETECTED Final    Comment: (NOTE) The GeneXpert MRSA Assay (FDA approved for NASAL specimens only), is one component of a comprehensive MRSA colonization surveillance program. It is not intended to diagnose MRSA infection nor to guide or monitor treatment for MRSA infections. Test performance is not FDA approved in patients less than 31 years old. Performed at [facility], [place] [address] [place], [place] [address]      Patient was seen and examined on the day of discharge and was found to be in stable condition. Time coordinating discharge: 35 minutes including assessment and coordination of care, as well as examination of the patient.   SIGNED:  Dessa Phi, DO [facility] 02/05/2022, 11:06 AM

## 2022-02-05 NOTE — Progress Notes (Signed)
Physical Therapy Treatment Patient Details Name: Ruben [name] MRN: [id number] DOB: Jun 24, 1945 Today's Date: 02/05/2022   History of Present Illness Pt is a 76 y/o M who presented to [facility] on 01/31/22 with c/o dark tarry stool. abdominal cramps/discomfort, and early satiety. Admitted for acute upper GI bleeding. Signicant PMH of ESRD, chronic HFrEF, paroxysmal afib, EtOH and tobacco abuse, HTN, HLD, depression, mitral valve regurgiation not a candidate for mitraclip, MI, and history of stress test and echocardiogram.    PT Comments    Pt received supine and agreeable to session with continued progress towards goals. Session focused on gait and transfer training for increased activity tolerance and improved balance/postural reactions. Pt able to complete gait with SPC and min guard for safety, pt with x1 LOB during 180 degree turn with ability to self correct with stepping strategy, pt able to complete x2 more times without LOB. Extended time spent discussing and education pt re; importance of continued mobility post acutely, increasing activity slowly and activity recomendations, HEP, and improtance of upright sitting and upright movement, pt verbalizng understanding. Pt continues to benefit from skilled PT services to progress toward functional mobility goals.    Recommendations for follow up therapy are one component of a multi-disciplinary discharge planning process, led by the attending physician.  Recommendations may be updated based on patient status, additional functional criteria and insurance authorization.  Follow Up Recommendations  Home health PT     Assistance Recommended at Discharge Set up Supervision/Assistance  Patient can return home with the following Help with stairs or ramp for entrance;A little help with bathing/dressing/bathroom;A little help with walking and/or transfers   Equipment Recommendations  None recommended by PT    Recommendations for Other Services        Precautions / Restrictions Precautions Precautions: Fall Restrictions Weight Bearing Restrictions: No     Mobility  Bed Mobility Overal bed mobility: Needs Assistance Bed Mobility: Supine to Sit     Supine to sit: Supervision     General bed mobility comments: no physical assist needed    Transfers Overall transfer level: Needs assistance Equipment used: Straight cane Transfers: Sit to/from Stand Sit to Stand: Min guard           General transfer comment: min guard for safety    Ambulation/Gait Ambulation/Gait assistance: Min guard Gait Distance (Feet): 250 Feet Assistive device: Straight cane Gait Pattern/deviations: Decreased stride length, Trunk flexed, Wide base of support Gait velocity: decreased     General Gait Details: slow mildy unsteady gait with SPC, x1 LOB during 180degree turn with pt able to self correct with stepping strategy, able to perform x2 without LOB subsequently. cues throughout for upright posture and forward gaze   Stairs             Wheelchair Mobility    Modified Rankin (Stroke Patients Only)       Balance Overall balance assessment: Needs assistance Sitting-balance support: No upper extremity supported, Feet supported Sitting balance-[name] Scale: Good Sitting balance - Comments: Pt able to sit EOB for prolonged period of time with no visual concerns of fatigue   Standing balance support: Bilateral upper extremity supported, During functional activity, Single extremity supported, No upper extremity supported Standing balance-[name] Scale: Fair                              Cognition Arousal/Alertness: Awake/alert Behavior During Therapy: Flat affect Overall Cognitive Status: Within Functional Limits for  tasks assessed Area of Impairment: Awareness, Problem solving                             Problem Solving: Slow processing General Comments: Pt alert and oriented requiring mild  increase in time for processing and cues for safety awarness        Exercises      General Comments General comments (skin integrity, edema, etc.): VSS on RA, pt daughter presnt and supportive throughout session, extended time spent discussing importance of contineud mobility post acutely, increasing activity slowly and activity recomendations,  HEP, and improtance of upright sitting and upright movement, pt verbalizng understanding      Pertinent Vitals/Pain Pain Assessment Pain Assessment: No/denies pain Pain Intervention(s): Monitored during session    Home Living                          Prior Function            PT Goals (current goals can now be found in the care plan section) Acute Rehab PT Goals PT Goal Formulation: With patient Time For Goal Achievement: 02/17/22    Frequency    Min 3X/week      PT Plan      Co-evaluation              AM-PAC PT "6 Clicks" Mobility   Outcome Measure  Help needed turning from your back to your side while in a flat bed without using bedrails?: None Help needed moving from lying on your back to sitting on the side of a flat bed without using bedrails?: None Help needed moving to and from a bed to a chair (including a wheelchair)?: A Little Help needed standing up from a chair using your arms (e.g., wheelchair or bedside chair)?: A Little Help needed to walk in hospital room?: A Little Help needed climbing 3-5 steps with a railing? : A Lot 6 Click Score: 19    End of Session   Activity Tolerance: Patient tolerated treatment well Patient left: with call bell/phone within reach;with family/visitor present;in bed (seated EOB) Nurse Communication: Mobility status PT Visit Diagnosis: Other abnormalities of gait and mobility (R26.89);Muscle weakness (generalized) (M62.81)     Time: 1535-1550 PT Time Calculation (min) (ACUTE ONLY): 15 min  Charges:  $Gait Training: 8-22 mins                     Ajeet Casasola R.  PTA Acute Rehabilitation Services Office: [place] 02/05/2022, 4:46 PM

## 2022-02-05 NOTE — Progress Notes (Signed)
D/C order noted. Contacted [facility] to advise clinic of pt's d/c today and that pt will resume care on Friday.   Melven Sartorius Renal Navigator [phone]

## 2022-02-05 NOTE — Progress Notes (Signed)
[facility] Progress Note  Subjective: seen on dialysis, BP's 90s- 100s.   Vitals:   02/05/22 1032 02/05/22 1100 02/05/22 1130 02/05/22 1200  BP: (!) 94/54 (!) 93/50 (!) 90/54 (!) 101/54  Pulse: 71 67 67 70  Resp: (!) '25 18 20 '$ (!) 21  Temp:      TempSrc:      SpO2: 93% 94% 94% 95%  Weight:      Height:        Exam:  alert, nad   no jvd  Chest cta bilat  Cor reg no RG  Abd soft ntnd no ascites   Ext no LE edema   Alert, NF, ox3    LUE AVG+bruit   OP HD:  MWF at [facility]  3h 68mn  400/800   70.5kg    2K/2Ca bath  P2   Hep none  LUE AVG - Mircera 369m IV q 4 week - just reordered, last got Mircera 3092mon 12/16/21. - Hgb 12.6 on 9/13, 11.2 on 9/20, 10.4 on 9/27 per outpatient lab records   Home meds - alprazolam, amiodarone, apixaban, atorvastatin, gabapentin, midodrine 15 tid, sertraline, sl ntg prn, prns/ vits/ supps  Assessment/Plan:  GI Bleed: FOBT +, dark stools for several days. Hb 9s. Per GI/ pmd.   ESRD - Usual MWF schedule. Had HD here off schedule 9/30, then had HD here Monday. Next HD today.   BP/volume - BPs on low side, no edema on exam, under dry wt, keep even w/ HD today. Cont home midodrine 15 po tid.   Anemia of ESRD + ABLA-  Hgb 8.8, sp aranesp here 100ug on Friday. Transfused 1U PRBC.  Metabolic bone disease- CorrCa slightly high, Phos 2.8. No VDRA as outpatient, follow.  Nutrition- Alb low, will add supps when allowed to eat.  A-fib- Eliquis on hold  Rob Banessa Mao 02/05/2022, 12:21 PM   Recent Labs  Lab 02/04/22 0024 02/05/22 0028  HGB 9.1* 9.8*  ALBUMIN 2.7* 2.8*  CALCIUM 8.8* 9.1  PHOS 2.7 4.2  CREATININE 4.16* 5.54*  K 3.5 4.2    No results for input(s): "IRON", "TIBC", "FERRITIN" in the last 168 hours. Inpatient medications:  amiodarone  200 mg Oral Daily   apixaban  5 mg Oral BID   atorvastatin  80 mg Oral QHS   Chlorhexidine Gluconate Cloth  6 each Topical Daily   Chlorhexidine Gluconate Cloth  6 each Topical Q0600    [START ON 02/07/2022] darbepoetin (ARANESP) injection - DIALYSIS  100 mcg Intravenous Q Fri-HD   gabapentin  100 mg Oral QHS   midodrine  15 mg Oral TID WC   pantoprazole  40 mg Oral BID   sertraline  25 mg Oral q AM   sodium chloride flush  3 mL Intravenous Q12H     acetaminophen **OR** acetaminophen, albuterol, ALPRAZolam, guaiFENesin-dextromethorphan, hydrOXYzine, nitroGLYCERIN, mouth rinse, polyvinyl alcohol

## 2022-02-06 ENCOUNTER — Inpatient Hospital Stay
Admission: RE | Admit: 2022-02-06 | Discharge: 2022-02-06 | Disposition: A | Discharge status: Home / Self Care | Payer: Medicare Other | Source: Ambulatory Visit | Attending: Internal Medicine | Admitting: Internal Medicine

## 2022-02-06 DIAGNOSIS — I480 Paroxysmal atrial fibrillation: Secondary | ICD-10-CM | POA: Diagnosis not present

## 2022-02-06 DIAGNOSIS — I2510 Atherosclerotic heart disease of native coronary artery without angina pectoris: Secondary | ICD-10-CM | POA: Diagnosis not present

## 2022-02-07 ENCOUNTER — Ambulatory Visit (HOSPITAL_COMMUNITY): Payer: Medicare Other

## 2022-02-07 DIAGNOSIS — N2581 Secondary hyperparathyroidism of renal origin: Secondary | ICD-10-CM | POA: Diagnosis not present

## 2022-02-07 DIAGNOSIS — Z23 Encounter for immunization: Secondary | ICD-10-CM | POA: Diagnosis not present

## 2022-02-07 DIAGNOSIS — D631 Anemia in chronic kidney disease: Secondary | ICD-10-CM | POA: Diagnosis not present

## 2022-02-07 DIAGNOSIS — Z992 Dependence on renal dialysis: Secondary | ICD-10-CM | POA: Diagnosis not present

## 2022-02-07 DIAGNOSIS — N186 End stage renal disease: Secondary | ICD-10-CM | POA: Diagnosis not present

## 2022-02-09 ENCOUNTER — Encounter (HOSPITAL_COMMUNITY): Payer: Self-pay | Admitting: Gastroenterology

## 2022-02-10 ENCOUNTER — Ambulatory Visit (HOSPITAL_COMMUNITY): Payer: Medicare Other

## 2022-02-10 DIAGNOSIS — N2581 Secondary hyperparathyroidism of renal origin: Secondary | ICD-10-CM | POA: Diagnosis not present

## 2022-02-10 DIAGNOSIS — N186 End stage renal disease: Secondary | ICD-10-CM | POA: Diagnosis not present

## 2022-02-10 DIAGNOSIS — Z23 Encounter for immunization: Secondary | ICD-10-CM | POA: Diagnosis not present

## 2022-02-10 DIAGNOSIS — Z992 Dependence on renal dialysis: Secondary | ICD-10-CM | POA: Diagnosis not present

## 2022-02-10 DIAGNOSIS — D631 Anemia in chronic kidney disease: Secondary | ICD-10-CM | POA: Diagnosis not present

## 2022-02-12 ENCOUNTER — Ambulatory Visit (HOSPITAL_COMMUNITY): Payer: Medicare Other

## 2022-02-12 DIAGNOSIS — N186 End stage renal disease: Secondary | ICD-10-CM | POA: Diagnosis not present

## 2022-02-12 DIAGNOSIS — Z23 Encounter for immunization: Secondary | ICD-10-CM | POA: Diagnosis not present

## 2022-02-12 DIAGNOSIS — D631 Anemia in chronic kidney disease: Secondary | ICD-10-CM | POA: Diagnosis not present

## 2022-02-12 DIAGNOSIS — Z992 Dependence on renal dialysis: Secondary | ICD-10-CM | POA: Diagnosis not present

## 2022-02-12 DIAGNOSIS — N2581 Secondary hyperparathyroidism of renal origin: Secondary | ICD-10-CM | POA: Diagnosis not present

## 2022-02-14 ENCOUNTER — Ambulatory Visit (HOSPITAL_COMMUNITY): Payer: Medicare Other

## 2022-02-14 DIAGNOSIS — Z992 Dependence on renal dialysis: Secondary | ICD-10-CM | POA: Diagnosis not present

## 2022-02-14 DIAGNOSIS — Z23 Encounter for immunization: Secondary | ICD-10-CM | POA: Diagnosis not present

## 2022-02-14 DIAGNOSIS — N186 End stage renal disease: Secondary | ICD-10-CM | POA: Diagnosis not present

## 2022-02-14 DIAGNOSIS — D631 Anemia in chronic kidney disease: Secondary | ICD-10-CM | POA: Diagnosis not present

## 2022-02-14 DIAGNOSIS — N2581 Secondary hyperparathyroidism of renal origin: Secondary | ICD-10-CM | POA: Diagnosis not present

## 2022-02-17 ENCOUNTER — Ambulatory Visit (HOSPITAL_COMMUNITY): Payer: Medicare Other

## 2022-02-17 DIAGNOSIS — Z23 Encounter for immunization: Secondary | ICD-10-CM | POA: Diagnosis not present

## 2022-02-17 DIAGNOSIS — Z992 Dependence on renal dialysis: Secondary | ICD-10-CM | POA: Diagnosis not present

## 2022-02-17 DIAGNOSIS — K5520 Angiodysplasia of colon without hemorrhage: Secondary | ICD-10-CM | POA: Diagnosis not present

## 2022-02-17 DIAGNOSIS — N186 End stage renal disease: Secondary | ICD-10-CM | POA: Diagnosis not present

## 2022-02-17 DIAGNOSIS — K921 Melena: Secondary | ICD-10-CM | POA: Diagnosis not present

## 2022-02-17 DIAGNOSIS — R1033 Periumbilical pain: Secondary | ICD-10-CM | POA: Diagnosis not present

## 2022-02-17 DIAGNOSIS — D631 Anemia in chronic kidney disease: Secondary | ICD-10-CM | POA: Diagnosis not present

## 2022-02-17 DIAGNOSIS — N2581 Secondary hyperparathyroidism of renal origin: Secondary | ICD-10-CM | POA: Diagnosis not present

## 2022-02-19 ENCOUNTER — Ambulatory Visit (HOSPITAL_COMMUNITY): Payer: Medicare Other

## 2022-02-19 ENCOUNTER — Ambulatory Visit (INDEPENDENT_AMBULATORY_CARE_PROVIDER_SITE_OTHER): Payer: Medicare Other | Admitting: Neurology

## 2022-02-19 DIAGNOSIS — N186 End stage renal disease: Secondary | ICD-10-CM | POA: Diagnosis not present

## 2022-02-19 DIAGNOSIS — G4733 Obstructive sleep apnea (adult) (pediatric): Secondary | ICD-10-CM | POA: Diagnosis not present

## 2022-02-19 DIAGNOSIS — G4720 Circadian rhythm sleep disorder, unspecified type: Secondary | ICD-10-CM

## 2022-02-19 DIAGNOSIS — N2581 Secondary hyperparathyroidism of renal origin: Secondary | ICD-10-CM | POA: Diagnosis not present

## 2022-02-19 DIAGNOSIS — R9431 Abnormal electrocardiogram [ECG] [EKG]: Secondary | ICD-10-CM

## 2022-02-19 DIAGNOSIS — R251 Tremor, unspecified: Secondary | ICD-10-CM

## 2022-02-19 DIAGNOSIS — Z23 Encounter for immunization: Secondary | ICD-10-CM | POA: Diagnosis not present

## 2022-02-19 DIAGNOSIS — I252 Old myocardial infarction: Secondary | ICD-10-CM

## 2022-02-19 DIAGNOSIS — Z992 Dependence on renal dialysis: Secondary | ICD-10-CM | POA: Diagnosis not present

## 2022-02-19 DIAGNOSIS — Z9981 Dependence on supplemental oxygen: Secondary | ICD-10-CM

## 2022-02-19 DIAGNOSIS — G4761 Periodic limb movement disorder: Secondary | ICD-10-CM

## 2022-02-19 DIAGNOSIS — D631 Anemia in chronic kidney disease: Secondary | ICD-10-CM | POA: Diagnosis not present

## 2022-02-19 DIAGNOSIS — G478 Other sleep disorders: Secondary | ICD-10-CM

## 2022-02-19 DIAGNOSIS — G479 Sleep disorder, unspecified: Secondary | ICD-10-CM

## 2022-02-21 ENCOUNTER — Ambulatory Visit (HOSPITAL_COMMUNITY): Payer: Medicare Other

## 2022-02-21 DIAGNOSIS — N2581 Secondary hyperparathyroidism of renal origin: Secondary | ICD-10-CM | POA: Diagnosis not present

## 2022-02-21 DIAGNOSIS — D631 Anemia in chronic kidney disease: Secondary | ICD-10-CM | POA: Diagnosis not present

## 2022-02-21 DIAGNOSIS — I480 Paroxysmal atrial fibrillation: Secondary | ICD-10-CM | POA: Diagnosis not present

## 2022-02-21 DIAGNOSIS — Z23 Encounter for immunization: Secondary | ICD-10-CM | POA: Diagnosis not present

## 2022-02-21 DIAGNOSIS — N186 End stage renal disease: Secondary | ICD-10-CM | POA: Diagnosis not present

## 2022-02-21 DIAGNOSIS — E7800 Pure hypercholesterolemia, unspecified: Secondary | ICD-10-CM | POA: Diagnosis not present

## 2022-02-21 DIAGNOSIS — K219 Gastro-esophageal reflux disease without esophagitis: Secondary | ICD-10-CM | POA: Diagnosis not present

## 2022-02-21 DIAGNOSIS — Z992 Dependence on renal dialysis: Secondary | ICD-10-CM | POA: Diagnosis not present

## 2022-02-21 DIAGNOSIS — I5022 Chronic systolic (congestive) heart failure: Secondary | ICD-10-CM | POA: Diagnosis not present

## 2022-02-24 ENCOUNTER — Ambulatory Visit (HOSPITAL_COMMUNITY): Payer: Medicare Other

## 2022-02-24 DIAGNOSIS — N186 End stage renal disease: Secondary | ICD-10-CM | POA: Diagnosis not present

## 2022-02-24 DIAGNOSIS — N2581 Secondary hyperparathyroidism of renal origin: Secondary | ICD-10-CM | POA: Diagnosis not present

## 2022-02-24 DIAGNOSIS — Z992 Dependence on renal dialysis: Secondary | ICD-10-CM | POA: Diagnosis not present

## 2022-02-24 DIAGNOSIS — D631 Anemia in chronic kidney disease: Secondary | ICD-10-CM | POA: Diagnosis not present

## 2022-02-24 DIAGNOSIS — Z23 Encounter for immunization: Secondary | ICD-10-CM | POA: Diagnosis not present

## 2022-02-24 NOTE — Addendum Note (Signed)
Addended by: [name] on: 02/24/2022 05:25 PM   Modules accepted: Orders

## 2022-02-24 NOTE — Procedures (Signed)
Physician Interpretation:     [facility] POLYSOMNOGRAPHY  INTERPRETATION REPORT   STUDY DATE:  02/19/2022     PATIENT NAME:  Ruben [name]         DATE OF BIRTH:  1945-05-29  PATIENT ID:  [id number]    TYPE OF STUDY:  PSG  READING PHYSICIAN: [name], MD, [name] REFERRED BY: Dr. Collene Leyden SCORING TECHNICIAN: Gaylyn Cheers, RPSGT   HISTORY: 76 year old male with an underlying complex medical history of end-stage renal disease, on hemodialysis, history of alcohol use disorder by chart review, hypertension, hyperlipidemia, coronary artery disease with history of MI, A-fib, CHF, chronic respiratory failure with hypoxia, arthritis, and prior?smoking, who reports intermittent trembling affecting his hands and feet.?He does not sleep very well, he snores, he has never had a sleep study, he is on supplemental oxygen at night at 2 L/min per cardiology. Height: 71 in Weight: 154 lb (BMI 21) Neck Size: 0   MEDICATIONS: Tylenol, Proventil, Xanax, Pacerone, Eliquis, Lipitor, Zyrtec, Vitamin D3, Valium, Neurontin, Anusol-HC, Atarax, Emla, Mag-Ox, Reglan, Proamatine, Nitrostat, Prilosec, Zofran, Preperation H, Miralax, Compazine, Zoloft, Multivitamins, Remeron TECHNICAL DESCRIPTION: A registered sleep technologist was in attendance for the duration of the recording.  Data collection, scoring, video monitoring, and reporting were performed in compliance with the AASM Manual for the Scoring of Sleep and Associated Events; (Hypopnea is scored based on the criteria listed in Section VIII D. 1b in the AASM Manual V2.6 using a 4% oxygen desaturation rule or Hypopnea is scored based on the criteria listed in Section VIII D. 1a in the AASM Manual V2.6 using 3% oxygen desaturation and /or arousal rule).   SLEEP CONTINUITY AND SLEEP ARCHITECTURE:  Lights-out was at 22:13: and lights-on at  04:37:, with a total recording time of 6 hours, 24 min . Total sleep time ( TST) was 54.5  minutes with a markedly low sleep efficiency of 14.2%.   BODY POSITION:  TST was divided  between the following sleep positions: 22.9% supine;  72.5% lateral;  0% prone. Duration of total sleep and percent of total sleep in their respective position is as follows: supine 12 minutes (23%), non-supine 42 minutes (77%); right 16 minutes (29%), left 23 minutes (43%), and prone 00 minutes (0%).  Total supine REM sleep time was 00 minutes (0% of total REM sleep). Sleep latency was normal at 18.5 minutes.  REM sleep latency was markedly delayed at 363.5 minutes. Of the total sleep time, the percentage of stage N1 sleep was 72.5%, which is markedly increased, stage N2 sleep was 26%, which is reduced, stage N3 sleep was absent, and REM sleep was nearly absent at 1.8%. Wake after sleep onset (WASO) time accounted for 311 minutes with poor sleep consolidation.  RESPIRATORY MONITORING:   Based on CMS criteria (using a 4% oxygen desaturation rule for scoring hypopneas), there were 8 apneas (5 obstructive; 1 central; 2 mixed), and 20 hypopneas.  Apnea index was 8.8. Hypopnea index was 22.0. The apnea-hypopnea index was 30.8/hour overall (48.0 supine, 0 non-supine; 0.0 REM, 0.0 supine REM).  There were 0 respiratory effort-related arousals (RERAs).  The RERA index was 0 events/h. Total respiratory disturbance index (RDI) was 30.8 events/h. RDI results showed: supine RDI  48.0 /h; non-supine RDI 25.7 /h; REM RDI 0.0 /h, supine REM RDI 0.0 /h.   Based on AASM criteria (using a 3% oxygen desaturation and /or arousal rule for scoring hypopneas), there were 8 apneas (5 obstructive; 1 central; 2 mixed), and  21 hypopneas. Apnea index was 8.8. Hypopnea index was 23.1. The apnea-hypopnea index was 31.9 overall (48.0 supine, 0 non-supine; 0.0 REM, 0.0 supine REM).  There were 0 respiratory effort-related arousals (RERAs).  The RERA index was 0 events/h. Total respiratory disturbance index (RDI) was 31.9 events/h. RDI results  showed: supine RDI  48.0 /h; non-supine RDI 27.1 /h; REM RDI 0.0 /h, supine REM RDI 0.0 /h.   OXIMETRY: Oxyhemoglobin Saturation Nadir during sleep was at  66%) from a mean of 91%. The study was started without supplemental oxygen. Of the Total sleep time (TST)   hypoxemia (=<88%) was present for  10.2 minutes, or 18.7% of total sleep time. Supplemental oxygen was started at 1 L/min at 3:14 AM. His baseline O2 saturation improved mildly.   LIMB MOVEMENTS: There were 62 periodic limb movements of sleep (68.3/hr), of which 7 (7.7/hr) were associated with an arousal.  AROUSAL: There were 38 arousals in total, for an arousal index of 42 arousals/hour.  Of these, 11 were identified as respiratory-related arousals (12 /h), 7 were PLM-related arousals (8 /h), and 30 were non-specific arousals (33 /h).  EEG: Review of the EEG showed no abnormal electrical discharges and symmetrical bihemispheric findings.    EKG: The EKG revealed normal sinus rhythm (NSR) with . The average heart rate during sleep was 91 bpm. Intermittent PVCs (premature ventricular contractions) were noted.  AUDIO/VIDEO REVIEW: The audio and video review did not show any abnormal or unusual behaviors, movements, phonations or vocalizations. The patient was very restless. The patient took 1 restroom break. Snoring was intermittent and mild.  POST-STUDY QUESTIONNAIRE: Post study, the patient indicated, that sleep was the same as usual. The patient reported to the sleep technician that he does have frequent nights similar to this, especially after having his dialysis.  IMPRESSION:   1. Poor sleep pattern 2. Obstructive sleep apnea (OSA) 3. Dysfunctions associated with sleep stages or arousal from sleep 4. PLMD (periodic limb movement disorder) 5. Non-specific abnormal electrocardiogram (EKG)  RECOMMENDATIONS:   1. This study was very limited secondary to poor sleep efficiency, and poor sleep consolidation.  During the limited amount  of total sleep time of less than 1 hour, there was evidence of obstructive sleep disordered breathing.  I would recommend a trial of AutoPap therapy without supplemental oxygen. Oxygen can be added if need be later on, I would recommend a pulse oximetry test while on AutoPap therapy, once patient is comfortable using AutoPAP. Given the patient's complex medical history and sleep related complaints, treatment with positive airway pressure is recommended. Please note, that untreated obstructive sleep apnea may carry additional perioperative morbidity. Patients with significant obstructive sleep apnea should receive perioperative PAP therapy and the surgeons and particularly the anesthesiologist should be informed of the diagnosis and the severity of the sleep disordered breathing. 2. Severe PLMs (periodic limb movements of sleep) were noted during this study with mild arousals; clinical correlation is recommended.  PLMS may improve with PAP therapy.  Medication effect from the antidepressant medication should be considered.  3. The study showed occasional PVCs on single lead EKG; clinical correlation is recommended. The patient is well-known to cardiology.  4. This study shows significant sleep fragmentation and abnormal sleep stage percentages; these are nonspecific findings and per se do not signify an intrinsic sleep disorder or a cause for the patient's sleep-related symptoms. Causes include (but are not limited to) the first night effect of the sleep study, circadian rhythm disturbances, medication effect or an underlying  mood disorder or medical problem.  5. The patient should be cautioned not to drive, work at heights, or operate dangerous or heavy equipment when tired or sleepy. Review and reiteration of good sleep hygiene measures should be pursued with any patient. 6. The patient will be seen in follow-up by Dr. Rexene Alberts at [facility] for discussion of the test results and further management strategies. The  referring provider will be notified of the test results.  I certify that I have reviewed the entire raw data recording prior to the issuance of this report in accordance with the Standards of Accreditation of the American Academy of Sleep Medicine (AASM).  [name], MD, [name] [facility]) [place], ABPN (Neurology and Sleep)              Technical Report:   General Information  Name: Gibson, Lad. BMI: 21.48 Physician: [name], MD  ID: [id number] Height: 71.0 in Technician: Gaylyn Cheers, RPSGT  Sex: Male Weight: 154.0 lb Record: [id number]  Age: 9 [[birth year]] Date: 02/19/2022    Medical & Medication History    Mr. Belmontes is a 76 year old right-handed gentleman with an underlying complex medical history of end-stage renal disease, on hemodialysis, history of alcohol use disorder by chart review, hypertension, hyperlipidemia, coronary artery disease with history of MI, A-fib, CHF, chronic respiratory failure with hypoxia, arthritis, and prior smoking, who reports intermittent trembling affecting his hands and feet. Symptoms started after his heart attack in December 2022. He has had more stress he admits. His primary care physician, Dr. Doristine Bosworth started him recently on sertraline 25 mg strength once daily. He has been on it for about 2 months. He has had several different medication trials. Gabapentin 100 mg at bedtime did not help his tremors. He does have trouble sleeping. He has a prescription for Xanax and Valium but these are not currently active and were only prescribed for a procedure or as needed use before dialysis. He does have a prescription for mirtazapine, 15 mg as well as 30 mg, he currently does not take any of these. He has a prescription for Reglan which he uses infrequently. He may go to bed around 10 PM but may not be asleep until the early morning hours. Rise time varies. He can drink up to 2 L of water per day but does not drink  that much. He does not drink any caffeine, has stopped drinking alcohol in December 2022 and does admit to having history of alcohol abuse. He quit smoking also in December 2022. He lives with his wife. He is the oldest of 10 children, none of his siblings has a tremor. He does not recall his parents having a tremor. None of his kids have a tremor, he has 3 boys and 1 daughter. He does not sleep very well, he snores, he has never had a sleep study, he is on supplemental oxygen at night at 2 L/min per cardiology. He has been on hemodialysis since February or March 2023. He does not have any nocturia, does not actually have much in the way of urine production any longer.  Tylenol, Proventil, Xanax, Pacerone, Eliquis, Lipitor, Zyrtec, Vitamin D3, Valium, Neurontin, Anusol-HC, Atarax, Emla, Mag-Ox, Reglan, Proamatine, Nitrostat, Prilosec, Zofran, Preperation H, Miralax, Compazine, Zoloft, Multivitamins, Remeron   Sleep Disorder      Comments   Patient arrived for a diagnostic polysomnogram. Procedure explained and all questions answered. Standard paste setup without complications. Patient quite restless and had significantly reduced sleep efficiency and delay  to persistent sleep. A lot of micro sleep. Patient reports having frequent nights like this, especially after having his dialysis appointments. Patient attempted to sleep supine, right, and left. Occasional mild audible snoring was heard. Respiratory events observed. 1/LPM oxygen was added at 03:14 to raise his SAO2 baseline. Occasional PVC's observed. Patient has a known cardiac history. No significant PLMS observed. One restroom visit.    Lights out: 10:13:05 PM Lights on: 04:37:09 AM   Time Total Supine Side Prone Upright  Recording (TRT) 6h 24.67m1h 44.539mh 48.68m47m 0.47m 468m51.47m  36mep (TST) 0h 54.68m 0h11m.68m 0h 247m68m 0h 0368m 0h 2.32m  Late568m N1 N2 N3 REM Onset Per. Slp. Eff.  Actual 0h 0.47m 0h 9.68m9m 0.47m 19m3.68m 014m8.68m 0268m.47m 14.52m    St78mur Wake N1 N2 N3 REM  Total 329.5 39.5 14.0 0.0 1.0  Supine 92.0 12.5 0.0 0.0 0.0  Side 189.0 24.5 14.0 0.0 1.0  Prone 0.0 0.0 0.0 0.0 0.0  Upright 48.5 2.5 0.0 0.0 0.0   Stg % Wake N1 N2 N3 REM  Total 85.8 72.5 25.7 0.0 1.8  Supine 24.0 22.9 0.0 0.0 0.0  Side 49.2 45.0 25.7 0.0 1.8  Prone 0.0 0.0 0.0 0.0 0.0  Upright 12.6 4.6 0.0 0.0 0.0     Apnea Summary Sub Supine Side Prone Upright  Total 8 Total '8 4 4 '$ 0 0    REM 0 0 0 0 0    NREM '8 4 4 '$ 0 0  Obs 5 REM 0 0 0 0 0    NREM '5 4 1 '$ 0 0  Mix 2 REM 0 0 0 0 0    NREM 2 0 2 0 0  Cen 1 REM 0 0 0 0 0    NREM 1 0 1 0 0   Rera Summary Sub Supine Side Prone Upright  Total 0 Total 0 0 0 0 0    REM 0 0 0 0 0    NREM 0 0 0 0 0   Hypopnea Summary Sub Supine Side Prone Upright  Total 21 Total '21 6 13 '$ 0 2    REM 0 0 0 0 0    NREM '21 6 13 '$ 0 2   4% Hypopnea Summary Sub Supine Side Prone Upright  Total (4%) 20 Total '20 6 12 '$ 0 2    REM 0 0 0 0 0    NREM '20 6 12 '$ 0 2     AHI Total Obs Mix Cen  31.93 Apnea 8.81 5.50 2.20 1.10   Hypopnea 23.12 -- -- --  30.83 Hypopnea (4%) 22.02 -- -- --    Total Supine Side Prone Upright  Position AHI 31.93 48.00 25.82 0.00 48.00  REM AHI 0.00   NREM AHI 32.52   Position RDI 31.93 48.00 25.82 0.00 48.00  REM RDI 0.00   NREM RDI 32.52    4% Hypopnea Total Supine Side Prone Upright  Position AHI (4%) 30.83 48.00 24.30 0.00 48.00  REM AHI (4%) 0.00   NREM AHI (4%) 31.40   Position RDI (4%) 30.83 48.00 24.30 0.00 48.00  REM RDI (4%) 0.00   NREM RDI (4%) 31.40    Desaturation Information Threshold: 2% <100% <90% <80% <70% <60% <50% <40%  Supine 111.0 67.0 1.0 0.0 0.0 0.0 0.0  Side 256.0 176.0 2.0 0.0 0.0 0.0 0.0  Prone 0.0 0.0 0.0 0.0 0.0 0.0 0.0  Upright 68.0 53.0 1.0 0.0 0.0  0.0 0.0  Total 435.0 296.0 4.0 0.0 0.0 0.0 0.0  Index 71.6 48.7 0.7 0.0 0.0 0.0 0.0   Threshold: 3% <100% <90% <80% <70% <60% <50% <40%  Supine 101.0 66.0 1.0 0.0 0.0 0.0 0.0  Side 233.0 173.0 2.0 0.0 0.0 0.0  0.0  Prone 0.0 0.0 0.0 0.0 0.0 0.0 0.0  Upright 67.0 53.0 1.0 0.0 0.0 0.0 0.0  Total 401.0 292.0 4.0 0.0 0.0 0.0 0.0  Index 66.0 48.1 0.7 0.0 0.0 0.0 0.0   Threshold: 4% <100% <90% <80% <70% <60% <50% <40%  Supine 91.0 65.0 1.0 0.0 0.0 0.0 0.0  Side 218.0 168.0 2.0 0.0 0.0 0.0 0.0  Prone 0.0 0.0 0.0 0.0 0.0 0.0 0.0  Upright 66.0 53.0 1.0 0.0 0.0 0.0 0.0  Total 375.0 286.0 4.0 0.0 0.0 0.0 0.0  Index 61.7 47.1 0.7 0.0 0.0 0.0 0.0   Threshold: 3% <100% <90% <80% <70% <60% <50% <40%  Supine 101 66 1 0 0 0 0  Side 233 173 2 0 0 0 0  Prone 0 0 0 0 0 0 0  Upright 67 53 1 0 0 0 0  Total 401 292 4 0 0 0 0   Awakening/Arousal Information # of Awakenings 30  Wake after sleep onset 311.26m Wake after persistent sleep 0.066m Arousal Assoc. Arousals Index  Apneas 2 2.2  Hypopneas 9 9.9  Leg Movements 13 14.3  Snore 0 0.0  PTT Arousals 0 0.0  Spontaneous 32 35.2  Total 56 61.7  Leg Movement Information PLMS LMs Index  Total LMs during PLMS 62 68.3  LMs w/ Microarousals 7 7.7   LM LMs Index  w/ Microarousal 5 5.5  w/ Awakening 3 3.3  w/ Resp Event 0 0.0  Spontaneous 8 8.8  Total 13 14.3     Desaturation threshold setting: 3% Minimum desaturation setting: 10 seconds SaO2 nadir: 64% The longest event was a 26 sec obstructive Hypopnea with a minimum SaO2 of 87%. The lowest SaO2 was 79% associated with a 11 sec obstructive Hypopnea. EKG Rates EKG Avg Max Min  Awake 91 128 76  Asleep 91 132 77  EKG Events: Tachycardia

## 2022-02-25 ENCOUNTER — Ambulatory Visit: Payer: Medicare Other | Admitting: Neurology

## 2022-02-25 ENCOUNTER — Encounter: Payer: Self-pay | Admitting: Neurology

## 2022-02-25 ENCOUNTER — Telehealth: Payer: Self-pay

## 2022-02-25 VITALS — BP 98/60 | HR 91 | Ht 70.0 in | Wt 154.0 lb

## 2022-02-25 DIAGNOSIS — G4733 Obstructive sleep apnea (adult) (pediatric): Secondary | ICD-10-CM | POA: Diagnosis not present

## 2022-02-25 DIAGNOSIS — R251 Tremor, unspecified: Secondary | ICD-10-CM

## 2022-02-25 DIAGNOSIS — G2581 Restless legs syndrome: Secondary | ICD-10-CM

## 2022-02-25 DIAGNOSIS — D649 Anemia, unspecified: Secondary | ICD-10-CM

## 2022-02-25 NOTE — Telephone Encounter (Signed)
Mateo Flow, RN; [place], [place]; [place], Eleonore Chiquito, [place]; Nash Shearer got it      Previous Messages    ----- Message -----  From: Brandon Melnick, RN  Sent: 02/25/2022   9:44 AM EDT  To: Darlina Guys; Miquel Dunn; Nash Shearer; *  Subject: please do ONO x 1 night on autopap on RA at *   Ruben [name]  Male, 76 y.o., 22-Dec-1945  MRN:  [id number]   [place] in [place]

## 2022-02-25 NOTE — Progress Notes (Signed)
Subjective:    Patient ID: Ruben [name] is a 76 y.o. male.  HPI    Interim history:   Ruben [name] is a 76 year old right-handed gentleman with an underlying complex medical history of end-stage renal disease, on hemodialysis, history of alcohol use disorder by chart review, hypertension, hyperlipidemia, coronary artery disease with history of MI, A-fib, CHF, chronic respiratory failure with hypoxia, arthritis, and prior smoking, who presents for follow-up consultation of his intermittent tremors.  The patient is accompanied by his daughter and wife today.  His daughter requested a sooner appointment for worsening tremors.  I first met him at the request of Dr. Esmeralda Links on 01/02/2022, at which time the patient reported intermittent trembling affecting his hands and feet since his heart attack in December 2022.  He had recently started taking sertraline 25 mg strength once daily, trial of gabapentin had not helped his tremors.  He reported trouble sleeping.  He was on oxygen at night.  He was advised to proceed with a sleep study.  He started hemodialysis in February or March 2023.  He had a recent baseline sleep study on 02/19/2022 which showed very little sleep.  He achieved less than 1 hour of sleep, he had long periods of wakefulness.  Overall AHI was elevated indicating obstructive sleep disordered breathing.  He had significant periodic leg movements of sleep with mild arousals.  He was advised to proceed with AutoPap therapy at home.  Today, 02/25/2022: He reports feeling a little better right now but has had symptoms of hand tremors intermittently and also symptoms of discomfort in both legs, feels like the legs want to separate from the knees.  He has recently fallen, thankfully without injury. He was recently hospitalized from 01/31/2022 through 02/05/2022 with acute GI bleed.  His hemoglobin upon admission was 8.8.  He had an EGD which showed Barrett's esophagus and single nonbleeding  angiodysplastic lesion which was treated.  He has not started any treatment for anemia, does not take any deep bulletin epoetin injections.   The patient's allergies, current medications, family history, past medical history, past social history, past surgical history and problem list were reviewed and updated as appropriate.   Previously:   01/02/22: (He) reports intermittent trembling affecting his hands and feet.  Symptoms started after his heart attack in December 2022.  He has had more stress he admits.  His primary care physician, Dr. Doristine Bosworth started him recently on sertraline 25 mg strength once daily.  He has been on it for about 2 months.  He has had several different medication trials.  Gabapentin 100 mg at bedtime did not help his tremors.  He does have trouble sleeping.  He has a prescription for Xanax and Valium but these are not currently active and were only prescribed for a procedure or as needed use before dialysis.  He does have a prescription for mirtazapine, 15 mg as well as 30 mg, he currently does not take any of these.  He has a prescription for Reglan which he uses infrequently.   He may go to bed around 10 PM but may not be asleep until the early morning hours.  Rise time varies. I reviewed your office note from 12/30/2021.  He has had blood work recently which I was able to review from 12/30/2021: Folate was over 20, ferritin elevated at 1275, A1c 5.8, iron saturation 28, TSH 4.31, within range, B12 1050.  He had labs on 11/28/2021: amylase mildly elevated at 188, CBC with differential and  platelets showed mildly below normal platelets at 144, hemoglobin below normal at 10.7, hematocrit below normal at 32.6, hepatic panel showed alk phos 86, ALT 24, AST 21.  Lipase elevated at 81, slightly above normal.   He can drink up to 2 L of water per day but does not drink that much.  He does not drink any caffeine, has stopped drinking alcohol in December 2022 and does admit to having  history of alcohol abuse.  He quit smoking also in December 2022.  He lives with his wife.  He is the oldest of 10 children, none of his siblings has a tremor.  He does not recall his parents having a tremor.  None of his kids have a tremor, he has 3 boys and 1 daughter.  He does not sleep very well, he snores, he has never had a sleep study, he is on supplemental oxygen at night at 2 L/min per cardiology.  He has been on hemodialysis since February or March 2023.  He does not have any nocturia, does not actually have much in the way of urine production any longer.  His Past Medical History Is Significant For: Past Medical History:  Diagnosis Date   Arthritis    Chronic kidney disease, stage 3b ([place])    ESRD (end stage renal disease) ([place])    ETOH abuse    History of stress test 09/02/2008   showed inferolateral scar without ischemia   Hx of echocardiogram 09/02/2008   was essentially normal   Hyperlipidemia    Hypertension    Myocardial infarction [facility])    Tobacco abuse     His Past Surgical History Is Significant For: Past Surgical History:  Procedure Laterality Date   AV FISTULA PLACEMENT Left 05/27/2021   Procedure: LEFT ARTERIOVENOUS (AV) GRAFT CREATION;  Surgeon: Broadus John, MD;  Location: [place];  Service: Vascular;  Laterality: Left;  PERIPHERAL NERVE BLOCK   BACK SURGERY     Dr Luiz Ochoa involving L3-L4 discectomy.   BIOPSY  06/14/2021   Procedure: BIOPSY;  Surgeon: Carol Ada, MD;  Location: [facility] ENDOSCOPY;  Service: Endoscopy;;   CARDIOVERSION N/A 05/13/2021   Procedure: CARDIOVERSION;  Surgeon: Larey Dresser, MD;  Location: [facility] ENDOSCOPY;  Service: Cardiovascular;  Laterality: N/A;   ESOPHAGOGASTRODUODENOSCOPY (EGD) WITH PROPOFOL N/A 02/04/2022   Procedure: ESOPHAGOGASTRODUODENOSCOPY (EGD) WITH PROPOFOL;  Surgeon: Carol Ada, MD;  Location: [place];  Service: Gastroenterology;  Laterality: N/A;   FLEXIBLE SIGMOIDOSCOPY N/A 06/14/2021   Procedure: FLEXIBLE  SIGMOIDOSCOPY;  Surgeon: Carol Ada, MD;  Location: [place];  Service: Endoscopy;  Laterality: N/A;   HEMOSTASIS CLIP PLACEMENT  02/04/2022   Procedure: HEMOSTASIS CLIP PLACEMENT;  Surgeon: Carol Ada, MD;  Location: [place];  Service: Gastroenterology;;   HERNIA REPAIR     HOT HEMOSTASIS N/A 02/04/2022   Procedure: HOT HEMOSTASIS (ARGON PLASMA COAGULATION/BICAP);  Surgeon: Carol Ada, MD;  Location: [place];  Service: Gastroenterology;  Laterality: N/A;   IR FLUORO GUIDE CV LINE RIGHT  05/21/2021   IR US GUIDE VASC ACCESS RIGHT  05/21/2021   RIGHT/LEFT HEART CATH AND CORONARY ANGIOGRAPHY N/A 05/09/2021   Procedure: RIGHT/LEFT HEART CATH AND CORONARY ANGIOGRAPHY;  Surgeon: Larey Dresser, MD;  Location: [place] CV LAB;  Service: Cardiovascular;  Laterality: N/A;   SPINE SURGERY     TEE WITHOUT CARDIOVERSION N/A 05/07/2021   Procedure: TRANSESOPHAGEAL ECHOCARDIOGRAM (TEE);  Surgeon: Larey Dresser, MD;  Location: [facility] ENDOSCOPY;  Service: Cardiovascular;  Laterality: N/A;   TEE  WITHOUT CARDIOVERSION N/A 05/13/2021   Procedure: TRANSESOPHAGEAL ECHOCARDIOGRAM (TEE);  Surgeon: Larey Dresser, MD;  Location[facility] ENDOSCOPY;  Service: Cardiovascular;  Laterality: N/A;   TEE WITHOUT CARDIOVERSION N/A 10/31/2021   Procedure: TRANSESOPHAGEAL ECHOCARDIOGRAM (TEE);  Surgeon: Larey Dresser, MD;  Location: [facility] ENDOSCOPY;  Service: Cardiovascular;  Laterality: N/A;    His Family History Is Significant For: Family History  Problem Relation Age of Onset   Heart attack Brother    Diabetes Brother    Heart disease Brother    Cancer Father    Diabetes Brother    Heart disease Brother     His Social History Is Significant For: Social History   Socioeconomic History   Marital status: Married    Spouse name: Not on file   Number of children: Not on file   Years of education: Not on file   Highest education level: Not on file  Occupational History   Not on file  Tobacco Use    Smoking status: Former    Packs/day: 1.50    Years: 20.00    Total pack years: 30.00    Types: Cigarettes    Quit date: 04/28/2021    Years since quitting: 0.8   Smokeless tobacco: Never   Tobacco comments:    down to 2 packs per week; 3/21 - down to 1 Pack per week  Vaping Use   Vaping Use: Never used  Substance and Sexual Activity   Alcohol use: No    Alcohol/week: 0.0 standard drinks of alcohol   Drug use: No   Sexual activity: Not on file  Other Topics Concern   Not on file  Social History Narrative   Caffiene- rare.    Education: HS   Retired: Administrator.    Right handed   Lives at home with wife and son   Social Determinants of Health   Financial Resource Strain: Medium Risk (05/02/2021)   Overall Financial Resource Strain (CARDIA)    Difficulty of Paying Living Expenses: Somewhat hard  Food Insecurity: No Food Insecurity (05/02/2021)   Hunger Vital Sign    Worried About Running Out of Food in the Last Year: Never true    Ran Out of Food in the Last Year: Never true  Transportation Needs: No Transportation Needs (05/02/2021)   PRAPARE - Hydrologist (Medical): No    Lack of Transportation (Non-Medical): No  Physical Activity: Not on file  Stress: Not on file  Social Connections: Not on file    His Allergies Are:  Allergies  Allergen Reactions   Other Other (See Comments)    Pollen - sneezing, itching/watery eyes  :   His Current Medications Are:  Outpatient Encounter Medications as of 02/25/2022  Medication Sig   acetaminophen (TYLENOL) 650 MG CR tablet Take 1,300 mg by mouth every 8 (eight) hours as needed for pain.   albuterol (PROVENTIL) (2.5 MG/3ML) 0.083% nebulizer solution Take 3 mLs (2.5 mg total) by nebulization every 4 (four) hours as needed for wheezing or shortness of breath.   amiodarone (PACERONE) 200 MG tablet Take 1 tablet (200 mg total) by mouth daily.   apixaban (ELIQUIS) 5 MG TABS tablet Take 1 tablet (5 mg  total) by mouth 2 (two) times daily.   atorvastatin (LIPITOR) 80 MG tablet Take 1 tablet (80 mg total) by mouth daily. (Patient taking differently: Take 80 mg by mouth at bedtime.)   cholecalciferol (VITAMIN D3) 25 MCG (1000 UNIT) tablet Take 1,000  Units by mouth in the morning.   ethyl chloride spray Apply 1 Application topically as needed (prior to dialysis).   gabapentin (NEURONTIN) 100 MG capsule Take 100 mg by mouth at bedtime.   hydrocortisone (ANUSOL-HC) 25 MG suppository Place 1 suppository (25 mg total) rectally 2 (two) times daily. (Patient taking differently: Place 25 mg rectally 2 (two) times daily as needed for hemorrhoids.)   hydrOXYzine (ATARAX) 25 MG tablet Take 25 mg by mouth daily.   loratadine (CLARITIN) 10 MG tablet Take 10 mg by mouth every other day.   LORazepam (ATIVAN) 0.5 MG tablet Take 0.5 mg by mouth as needed.   magnesium oxide (MAG-OX) 400 (240 Mg) MG tablet Take 400 mg by mouth daily in the afternoon.   melatonin 5 MG TABS Take 5-10 mg by mouth at bedtime as needed (sleep).   metoCLOPramide (REGLAN) 5 MG tablet Take 1 tablet (5 mg total) by mouth every 8 (eight) hours as needed for nausea.   midodrine (PROAMATINE) 10 MG tablet Take 1.5 tablets (15 mg total) by mouth 3 (three) times daily with meals.   nitroGLYCERIN (NITROSTAT) 0.4 MG SL tablet Place 1 tablet (0.4 mg total) under the tongue every 5 (five) minutes as needed for chest pain.   NONFORMULARY OR COMPOUNDED ITEM Place 1 Application rectally 2 (two) times daily as needed for hemorrhoids. Diltiazem 2% Ointment   ondansetron (ZOFRAN) 4 MG tablet Take 1 tablet (4 mg total) by mouth daily as needed for nausea or vomiting.   pantoprazole (PROTONIX) 40 MG tablet Take 1 tablet (40 mg total) by mouth 2 (two) times daily.   phenylephrine (,USE FOR PREPARATION-H,) 0.25 % suppository Place 1 suppository rectally 2 (two) times daily as needed for hemorrhoids.   polyethylene glycol powder (MIRALAX) 17 GM/SCOOP powder Take  17 g by mouth daily. Make be given every other day as well   prochlorperazine (COMPAZINE) 5 MG tablet Take 5 mg by mouth 3 (three) times daily as needed for nausea or vomiting.   Propylene Glycol (SYSTANE COMPLETE) 0.6 % SOLN Place 1 drop into both eyes 3 (three) times daily as needed (dry/irritated eyes.).   sertraline (ZOLOFT) 25 MG tablet Take 25 mg by mouth in the morning.   [DISCONTINUED] ALPRAZolam (XANAX) 0.25 MG tablet Take 1 tablet (0.25 mg total) by mouth daily as needed for anxiety (anxiety with dialysis). (Patient not taking: Reported on 02/25/2022)   [DISCONTINUED] hydrOXYzine (ATARAX) 10 MG tablet Take 10 mg by mouth at bedtime as needed for anxiety. (Patient not taking: Reported on 02/25/2022)   No facility-administered encounter medications on file as of 02/25/2022.  :  Review of Systems:  Out of a complete 14 point review of systems, all are reviewed and negative with the exception of these symptoms as listed below:  Review of Systems  Neurological:        Patient is here with his daughter and wife for follow-up of tremor. He reports he could not finish Dialysis yesterday due to the episode. He reports shaking of both legs, R hand greater than L hand, and sometimes the whole body. The episodes occur about every night. He states it is not painful but it feels like someone is trying to separate his "knees and stuff". Daughter reports he fell coming in today but denies any injuries.    Objective:  Neurological Exam  Physical Exam Physical Examination:   Vitals:   02/25/22 1412  BP: 98/60  Pulse: 91    General Examination: The patient is  a very pleasant 76 y.o. male in no acute distress.  He is in a wheelchair.    HEENT: Normocephalic, atraumatic, pupils are equal, round and reactive to light, extraocular tracking is good without limitation to gaze excursion or nystagmus noted. Hearing is grossly intact. Face is symmetric with normal facial animation. Speech is clear  without dysarthria, hypophonia or voice tremor. Neck is supple with full range of passive and active motion. There are no carotid bruits on auscultation. Oropharynx exam reveals: adequate dental hygiene with dentures and moderate airway crowding, due to redundant soft palate and larger uvula, tonsils either absent or on the small side, left side some tonsillar tissue visible.  Mallampati class II.    Chest: Clear to auscultation without wheezing, rhonchi or crackles noted.   Heart: S1+S2+0, regular and normal without murmurs, rubs or gallops noted.    Abdomen: Soft, non-tender and non-distended.   Extremities: There is no trace edema in the distal lower extremities bilaterally.  Left arm AV fistula.   Skin: Warm and dry without trophic changes noted.    Musculoskeletal: exam reveals no obvious joint deformities.    Neurologically:  Mental status: The patient is awake, alert and oriented in all 4 spheres. His immediate and remote memory, attention, language skills and fund of knowledge are appropriate. There is no evidence of aphasia, agnosia, apraxia or anomia. Speech is clear with normal prosody and enunciation. Thought process is linear. Mood is normal and affect is normal.  Cranial nerves II - XII are as described above under HEENT exam.  Motor exam: Normal bulk, strength and tone is noted. There is no obvious resting tremor.  He has a very mild right upper extremity and slight left upper extremity postural tremor, no action tremor, no intention tremor, stable. (On 01/02/2022: On Archimedes spiral drawing there is no significant trembling with either hand, handwriting is legible, slightly tremulous, not micrographic.) Fine motor skills and coordination: Mildly impaired globally.  Cerebellar testing: No dysmetria or intention tremor.  Sensory exam: intact to light touch in the upper and lower extremities.  Gait, station and balance: He is in a WC, I did not have him stand or walk for me today.     Assessment and Plan:  In summary, Igor Junior Habib is a very pleasant 76 year old male with an underlying complex medical history of end-stage renal disease, on hemodialysis, history of alcohol use disorder by chart review, hypertension, hyperlipidemia, coronary artery disease with history of MI, A-fib, CHF, chronic respiratory failure with hypoxia, arthritis, and prior smoking, who presents for follow-up consultation of his intermittent tremors.  His history is suggestive of intermittent restless leg symptoms.  This likely ties in with his chronic kidney failure and anemia.  He had a recent hospitalization for upper GI bleed and hemoglobin was low.  He is advised that there is no quick solution for him, anemia management and chronic kidney disease management are key.  He is advised to follow-up with his kidney specialist and his primary care in that regard to discuss anemia management in particular.  He is advised to start AutoPap therapy for evidence of sleep apnea.  He had a baseline sleep study 02/19/2022 which showed sleep, evidence of sleep disordered breathing and also leg twitching at night.  He is advised that treating sleep apnea may improve his restless leg symptoms as well.  He is agreeable to pursuing AutoPap therapy and we sent an order.  He is advised for his first PAP treatment checkup in  about 3 months.  I did not suggest any new medications at this time.  Given his complicated history and kidney failure he is at risk for side effects and medication options are limited for restless legs and for tremors.  He is again noted to have a low blood pressure.  A beta-blocker would therefore not be a good choice for him.  He has tried multiple medications to help him sleep at night.  We will start therapy without his supplemental oxygen if possible and pursue an overnight pulse oximetry test once he is on AutoPap therapy to see if we can maintain treatment without additional oxygen but if need be  we can also add the oxygen back in.  I answered all their questions today and the patient and his family were in agreement with our plan. I spent 30 minutes in total face-to-face time and in reviewing records during pre-charting, more than 50% of which was spent in counseling and coordination of care, reviewing test results, reviewing medications and treatment regimen and/or in discussing or reviewing the diagnosis of OSA, tremor, RLS, the prognosis and treatment options. Pertinent laboratory and imaging test results that were available during this visit with the patient were reviewed by me and considered in my medical decision making (see chart for details).

## 2022-02-25 NOTE — Patient Instructions (Signed)
As discussed, we will start you on AutoPap therapy for sleep apnea.  You likely have intermittent tremors but also restless leg symptoms which are connected to chronic kidney disease and also anemia.  Please follow-up with your primary care regarding anemia management and also with your kidney doctor.  Treating sleep apnea can improve restless leg symptoms over time.  We will plan a follow-up for your first checkup after starting AutoPap therapy in the next 3 months, we will start treatment with your AutoPap machine without supplemental oxygen for now but may have to add oxygen back and if need be.  We will do a pulse oximetry test overnight once you are on your AutoPap machine to check your oxygen saturations overnight to see if we can get by without additional oxygen and just with AutoPap treatment.

## 2022-02-25 NOTE — Telephone Encounter (Signed)
-----   Message from [name], MD sent at 02/24/2022  5:25 PM EDT ----- Patient referred by Dr. Esmeralda Links for tremors, seen by me on 01/02/2022, he reported not sleeping well and I suggested we proceed with a sleep study.  He had a PSG on 02/19/2022.    Please call and notify the patient that the recent sleep study was limited because he did not sleep much, the achieved altogether less than 1 hour of sleep but there was evidence of sleep apnea. I recommend treatment in the form of autoPAP, which means, that we don't have to bring him back for a second sleep study with CPAP, but will let him try an autoPAP machine at home, through a DME company (of his choice, or as per insurance requirement). The DME representative will educate him on how to use the machine, how to put the mask on, etc. I have placed an order in the chart.   I also placed an order for pulse oximetry.  He should have an established DME company already.  For now, I want to suggest that we use AutoPap without his oxygen added, he has oxygen for overnight use from his cardiologist as I understand.  We can do an overnight pulse oximetry test while he is on AutoPap therapy to see if oxygen levels hold up with just AutoPap.  If not, we may have to bleed in the oxygen into the AutoPap.   Please send referral, talk to patient, send report to referring MD. We will need a FU in sleep clinic for 10 weeks post-PAP set up, please arrange that with me or one of our NPs. Thanks,   [name], MD, [name] [facility])

## 2022-02-25 NOTE — Telephone Encounter (Signed)
I called patient.  I spoke with Reba, patient's daughter, per DPR.  I discussed patient's sleep study results and recommendations with her.  Patient's daughter is agreeable to patient starting an AutoPap.  She understands that patient should be off oxygen while using the AutoPap until the results of the ONO on AutoPap have been reviewed.  If needed, oxygen may need to be bled into his AutoPap.  On his way out of the appointment today, patient will schedule a 10-week follow-up with either Dr. Rexene Alberts or the NP for follow up on his AutoPap.  If there are further questions about the sleep study and recommendations, patient's daughter and patient will discuss with Dr. Rexene Alberts today at his appointment.  Patient's daughter verbalized understanding of results and recommendations at this time.  I have sent the AutoPap and ONO on the AutoPap order to [facility].

## 2022-02-26 ENCOUNTER — Ambulatory Visit (HOSPITAL_COMMUNITY): Payer: Medicare Other

## 2022-02-26 DIAGNOSIS — N186 End stage renal disease: Secondary | ICD-10-CM | POA: Diagnosis not present

## 2022-02-26 DIAGNOSIS — Z23 Encounter for immunization: Secondary | ICD-10-CM | POA: Diagnosis not present

## 2022-02-26 DIAGNOSIS — D631 Anemia in chronic kidney disease: Secondary | ICD-10-CM | POA: Diagnosis not present

## 2022-02-26 DIAGNOSIS — N2581 Secondary hyperparathyroidism of renal origin: Secondary | ICD-10-CM | POA: Diagnosis not present

## 2022-02-26 DIAGNOSIS — Z992 Dependence on renal dialysis: Secondary | ICD-10-CM | POA: Diagnosis not present

## 2022-02-27 ENCOUNTER — Encounter (HOSPITAL_BASED_OUTPATIENT_CLINIC_OR_DEPARTMENT_OTHER): Payer: Self-pay

## 2022-02-27 ENCOUNTER — Inpatient Hospital Stay (HOSPITAL_BASED_OUTPATIENT_CLINIC_OR_DEPARTMENT_OTHER)
Admission: EM | Admit: 2022-02-27 | Discharge: 2022-03-06 | DRG: 727 | Disposition: A | Discharge status: Home Health | Payer: Medicare Other | Attending: Internal Medicine | Admitting: Internal Medicine

## 2022-02-27 ENCOUNTER — Emergency Department (HOSPITAL_BASED_OUTPATIENT_CLINIC_OR_DEPARTMENT_OTHER): Payer: Medicare Other

## 2022-02-27 ENCOUNTER — Other Ambulatory Visit: Payer: Self-pay

## 2022-02-27 DIAGNOSIS — R0902 Hypoxemia: Secondary | ICD-10-CM | POA: Diagnosis not present

## 2022-02-27 DIAGNOSIS — K6289 Other specified diseases of anus and rectum: Secondary | ICD-10-CM | POA: Diagnosis not present

## 2022-02-27 DIAGNOSIS — R945 Abnormal results of liver function studies: Secondary | ICD-10-CM | POA: Diagnosis not present

## 2022-02-27 DIAGNOSIS — D649 Anemia, unspecified: Secondary | ICD-10-CM | POA: Diagnosis present

## 2022-02-27 DIAGNOSIS — R791 Abnormal coagulation profile: Secondary | ICD-10-CM | POA: Diagnosis not present

## 2022-02-27 DIAGNOSIS — K7460 Unspecified cirrhosis of liver: Secondary | ICD-10-CM | POA: Diagnosis not present

## 2022-02-27 DIAGNOSIS — F32A Depression, unspecified: Secondary | ICD-10-CM | POA: Diagnosis not present

## 2022-02-27 DIAGNOSIS — D696 Thrombocytopenia, unspecified: Secondary | ICD-10-CM | POA: Diagnosis not present

## 2022-02-27 DIAGNOSIS — N186 End stage renal disease: Secondary | ICD-10-CM | POA: Diagnosis present

## 2022-02-27 DIAGNOSIS — E669 Obesity, unspecified: Secondary | ICD-10-CM | POA: Diagnosis present

## 2022-02-27 DIAGNOSIS — I255 Ischemic cardiomyopathy: Secondary | ICD-10-CM | POA: Diagnosis present

## 2022-02-27 DIAGNOSIS — Z8249 Family history of ischemic heart disease and other diseases of the circulatory system: Secondary | ICD-10-CM

## 2022-02-27 DIAGNOSIS — I129 Hypertensive chronic kidney disease with stage 1 through stage 4 chronic kidney disease, or unspecified chronic kidney disease: Secondary | ICD-10-CM | POA: Diagnosis not present

## 2022-02-27 DIAGNOSIS — Z87891 Personal history of nicotine dependence: Secondary | ICD-10-CM

## 2022-02-27 DIAGNOSIS — M1990 Unspecified osteoarthritis, unspecified site: Secondary | ICD-10-CM | POA: Diagnosis present

## 2022-02-27 DIAGNOSIS — I480 Paroxysmal atrial fibrillation: Secondary | ICD-10-CM | POA: Diagnosis not present

## 2022-02-27 DIAGNOSIS — K828 Other specified diseases of gallbladder: Secondary | ICD-10-CM | POA: Diagnosis not present

## 2022-02-27 DIAGNOSIS — I120 Hypertensive chronic kidney disease with stage 5 chronic kidney disease or end stage renal disease: Secondary | ICD-10-CM | POA: Diagnosis not present

## 2022-02-27 DIAGNOSIS — F1010 Alcohol abuse, uncomplicated: Secondary | ICD-10-CM | POA: Diagnosis not present

## 2022-02-27 DIAGNOSIS — E1122 Type 2 diabetes mellitus with diabetic chronic kidney disease: Secondary | ICD-10-CM | POA: Diagnosis present

## 2022-02-27 DIAGNOSIS — D631 Anemia in chronic kidney disease: Secondary | ICD-10-CM | POA: Diagnosis present

## 2022-02-27 DIAGNOSIS — Z7901 Long term (current) use of anticoagulants: Secondary | ICD-10-CM

## 2022-02-27 DIAGNOSIS — I708 Atherosclerosis of other arteries: Secondary | ICD-10-CM | POA: Diagnosis present

## 2022-02-27 DIAGNOSIS — J90 Pleural effusion, not elsewhere classified: Secondary | ICD-10-CM | POA: Diagnosis not present

## 2022-02-27 DIAGNOSIS — R109 Unspecified abdominal pain: Secondary | ICD-10-CM | POA: Diagnosis not present

## 2022-02-27 DIAGNOSIS — R1011 Right upper quadrant pain: Secondary | ICD-10-CM | POA: Diagnosis not present

## 2022-02-27 DIAGNOSIS — I132 Hypertensive heart and chronic kidney disease with heart failure and with stage 5 chronic kidney disease, or end stage renal disease: Secondary | ICD-10-CM | POA: Diagnosis not present

## 2022-02-27 DIAGNOSIS — Z79899 Other long term (current) drug therapy: Secondary | ICD-10-CM

## 2022-02-27 DIAGNOSIS — N250 Renal osteodystrophy: Secondary | ICD-10-CM | POA: Diagnosis not present

## 2022-02-27 DIAGNOSIS — R7401 Elevation of levels of liver transaminase levels: Secondary | ICD-10-CM | POA: Diagnosis not present

## 2022-02-27 DIAGNOSIS — M898X9 Other specified disorders of bone, unspecified site: Secondary | ICD-10-CM | POA: Diagnosis present

## 2022-02-27 DIAGNOSIS — I5022 Chronic systolic (congestive) heart failure: Secondary | ICD-10-CM | POA: Diagnosis present

## 2022-02-27 DIAGNOSIS — Z992 Dependence on renal dialysis: Secondary | ICD-10-CM

## 2022-02-27 DIAGNOSIS — Z833 Family history of diabetes mellitus: Secondary | ICD-10-CM

## 2022-02-27 DIAGNOSIS — I10 Essential (primary) hypertension: Secondary | ICD-10-CM | POA: Diagnosis present

## 2022-02-27 DIAGNOSIS — N281 Cyst of kidney, acquired: Secondary | ICD-10-CM | POA: Diagnosis not present

## 2022-02-27 DIAGNOSIS — I700 Atherosclerosis of aorta: Secondary | ICD-10-CM | POA: Diagnosis not present

## 2022-02-27 DIAGNOSIS — H7093 Unspecified mastoiditis, bilateral: Secondary | ICD-10-CM | POA: Diagnosis not present

## 2022-02-27 DIAGNOSIS — N410 Acute prostatitis: Secondary | ICD-10-CM | POA: Diagnosis not present

## 2022-02-27 DIAGNOSIS — K769 Liver disease, unspecified: Secondary | ICD-10-CM | POA: Diagnosis not present

## 2022-02-27 DIAGNOSIS — K649 Unspecified hemorrhoids: Secondary | ICD-10-CM | POA: Diagnosis present

## 2022-02-27 DIAGNOSIS — K8020 Calculus of gallbladder without cholecystitis without obstruction: Secondary | ICD-10-CM | POA: Diagnosis not present

## 2022-02-27 DIAGNOSIS — N2889 Other specified disorders of kidney and ureter: Secondary | ICD-10-CM | POA: Diagnosis not present

## 2022-02-27 DIAGNOSIS — I340 Nonrheumatic mitral (valve) insufficiency: Secondary | ICD-10-CM | POA: Diagnosis present

## 2022-02-27 DIAGNOSIS — K760 Fatty (change of) liver, not elsewhere classified: Secondary | ICD-10-CM | POA: Diagnosis present

## 2022-02-27 DIAGNOSIS — I9589 Other hypotension: Secondary | ICD-10-CM | POA: Diagnosis present

## 2022-02-27 DIAGNOSIS — R188 Other ascites: Secondary | ICD-10-CM | POA: Diagnosis present

## 2022-02-27 DIAGNOSIS — Z6823 Body mass index (BMI) 23.0-23.9, adult: Secondary | ICD-10-CM

## 2022-02-27 DIAGNOSIS — E785 Hyperlipidemia, unspecified: Secondary | ICD-10-CM | POA: Diagnosis present

## 2022-02-27 DIAGNOSIS — F419 Anxiety disorder, unspecified: Secondary | ICD-10-CM | POA: Diagnosis present

## 2022-02-27 DIAGNOSIS — R748 Abnormal levels of other serum enzymes: Secondary | ICD-10-CM | POA: Diagnosis not present

## 2022-02-27 DIAGNOSIS — N419 Inflammatory disease of prostate, unspecified: Secondary | ICD-10-CM | POA: Diagnosis present

## 2022-02-27 DIAGNOSIS — F064 Anxiety disorder due to known physiological condition: Secondary | ICD-10-CM | POA: Diagnosis not present

## 2022-02-27 DIAGNOSIS — I2510 Atherosclerotic heart disease of native coronary artery without angina pectoris: Secondary | ICD-10-CM | POA: Diagnosis present

## 2022-02-27 DIAGNOSIS — Z888 Allergy status to other drugs, medicaments and biological substances status: Secondary | ICD-10-CM

## 2022-02-27 DIAGNOSIS — I5023 Acute on chronic systolic (congestive) heart failure: Secondary | ICD-10-CM | POA: Diagnosis not present

## 2022-02-27 DIAGNOSIS — E7800 Pure hypercholesterolemia, unspecified: Secondary | ICD-10-CM | POA: Diagnosis not present

## 2022-02-27 DIAGNOSIS — G4733 Obstructive sleep apnea (adult) (pediatric): Secondary | ICD-10-CM | POA: Diagnosis present

## 2022-02-27 DIAGNOSIS — K611 Rectal abscess: Secondary | ICD-10-CM | POA: Diagnosis not present

## 2022-02-27 DIAGNOSIS — K8080 Other cholelithiasis without obstruction: Secondary | ICD-10-CM | POA: Diagnosis not present

## 2022-02-27 DIAGNOSIS — Z720 Tobacco use: Secondary | ICD-10-CM | POA: Diagnosis present

## 2022-02-27 DIAGNOSIS — I252 Old myocardial infarction: Secondary | ICD-10-CM

## 2022-02-27 DIAGNOSIS — R933 Abnormal findings on diagnostic imaging of other parts of digestive tract: Secondary | ICD-10-CM | POA: Diagnosis not present

## 2022-02-27 LAB — CBC WITH DIFFERENTIAL/PLATELET
Abs Immature Granulocytes: 0.04 10*3/uL (ref 0.00–0.07)
Basophils Absolute: 0 10*3/uL (ref 0.0–0.1)
Basophils Relative: 0 %
Eosinophils Absolute: 0 10*3/uL (ref 0.0–0.5)
Eosinophils Relative: 0 %
HCT: 30.5 % — ABNORMAL LOW (ref 39.0–52.0)
Hemoglobin: 10.4 g/dL — ABNORMAL LOW (ref 13.0–17.0)
Immature Granulocytes: 1 %
Lymphocytes Relative: 10 %
Lymphs Abs: 0.7 10*3/uL (ref 0.7–4.0)
MCH: 31.5 pg (ref 26.0–34.0)
MCHC: 34.1 g/dL (ref 30.0–36.0)
MCV: 92.4 fL (ref 80.0–100.0)
Monocytes Absolute: 0.4 10*3/uL (ref 0.1–1.0)
Monocytes Relative: 6 %
Neutro Abs: 6 10*3/uL (ref 1.7–7.7)
Neutrophils Relative %: 83 %
Platelets: 126 10*3/uL — ABNORMAL LOW (ref 150–400)
RBC: 3.3 MIL/uL — ABNORMAL LOW (ref 4.22–5.81)
RDW: 22.6 % — ABNORMAL HIGH (ref 11.5–15.5)
Smear Review: NORMAL
WBC: 7.2 10*3/uL (ref 4.0–10.5)
nRBC: 0.4 % — ABNORMAL HIGH (ref 0.0–0.2)

## 2022-02-27 LAB — COMPREHENSIVE METABOLIC PANEL
ALT: 126 U/L — ABNORMAL HIGH (ref 0–44)
AST: 231 U/L — ABNORMAL HIGH (ref 15–41)
Albumin: 3.7 g/dL (ref 3.5–5.0)
Alkaline Phosphatase: 115 U/L (ref 38–126)
Anion gap: 14 (ref 5–15)
BUN: 34 mg/dL — ABNORMAL HIGH (ref 8–23)
CO2: 31 mmol/L (ref 22–32)
Calcium: 9.7 mg/dL (ref 8.9–10.3)
Chloride: 94 mmol/L — ABNORMAL LOW (ref 98–111)
Creatinine, Ser: 6.57 mg/dL — ABNORMAL HIGH (ref 0.61–1.24)
GFR, Estimated: 8 mL/min — ABNORMAL LOW (ref >60)
Glucose, Bld: 99 mg/dL (ref 70–99)
Potassium: 3.9 mmol/L (ref 3.5–5.1)
Sodium: 139 mmol/L (ref 135–145)
Total Bilirubin: 3 mg/dL — ABNORMAL HIGH (ref 0.3–1.2)
Total Protein: 6.9 g/dL (ref 6.5–8.1)

## 2022-02-27 LAB — LACTIC ACID, PLASMA: Lactic Acid, Venous: 2.1 mmol/L (ref 0.5–1.9)

## 2022-02-27 MED ORDER — AMOXICILLIN-POT CLAVULANATE 875-125 MG PO TABS
1.0000 | ORAL_TABLET | Freq: Once | ORAL | Status: AC
  Administered 2022-02-28: 1 via ORAL
  Filled 2022-02-27: qty 1

## 2022-02-27 MED ORDER — KETOROLAC TROMETHAMINE 60 MG/2ML IM SOLN
15.0000 mg | Freq: Once | INTRAMUSCULAR | Status: AC
  Administered 2022-02-28: 15 mg via INTRAMUSCULAR
  Filled 2022-02-27: qty 2

## 2022-02-27 MED ORDER — ALUM & MAG HYDROXIDE-SIMETH 200-200-20 MG/5ML PO SUSP
30.0000 mL | Freq: Once | ORAL | Status: AC
  Administered 2022-02-28: 30 mL via ORAL
  Filled 2022-02-27: qty 30

## 2022-02-27 MED ORDER — IOHEXOL 300 MG/ML  SOLN
100.0000 mL | Freq: Once | INTRAMUSCULAR | Status: AC | PRN
  Administered 2022-02-27: 100 mL via INTRAVENOUS

## 2022-02-27 MED ORDER — DICYCLOMINE HCL 10 MG/ML IM SOLN
20.0000 mg | Freq: Once | INTRAMUSCULAR | Status: AC
  Administered 2022-02-28: 20 mg via INTRAMUSCULAR
  Filled 2022-02-27 (×2): qty 2

## 2022-02-27 NOTE — ED Triage Notes (Signed)
Pt was at doctors office and was told to come to ED for CT of rectal area/pelvis and for blood work. Was at doctors office for hemorrhoid check and they want to rule out rectal abscess. Pt on blood thinners; dialysis MWF.

## 2022-02-28 ENCOUNTER — Emergency Department (HOSPITAL_BASED_OUTPATIENT_CLINIC_OR_DEPARTMENT_OTHER): Payer: Medicare Other

## 2022-02-28 ENCOUNTER — Inpatient Hospital Stay (HOSPITAL_COMMUNITY): Payer: Medicare Other

## 2022-02-28 ENCOUNTER — Encounter (HOSPITAL_COMMUNITY): Payer: Self-pay

## 2022-02-28 ENCOUNTER — Ambulatory Visit (HOSPITAL_COMMUNITY): Payer: Medicare Other

## 2022-02-28 DIAGNOSIS — N281 Cyst of kidney, acquired: Secondary | ICD-10-CM | POA: Diagnosis not present

## 2022-02-28 DIAGNOSIS — N419 Inflammatory disease of prostate, unspecified: Secondary | ICD-10-CM | POA: Diagnosis present

## 2022-02-28 DIAGNOSIS — F419 Anxiety disorder, unspecified: Secondary | ICD-10-CM | POA: Diagnosis not present

## 2022-02-28 DIAGNOSIS — R188 Other ascites: Secondary | ICD-10-CM | POA: Diagnosis not present

## 2022-02-28 DIAGNOSIS — N410 Acute prostatitis: Secondary | ICD-10-CM

## 2022-02-28 DIAGNOSIS — I5022 Chronic systolic (congestive) heart failure: Secondary | ICD-10-CM | POA: Diagnosis not present

## 2022-02-28 DIAGNOSIS — I132 Hypertensive heart and chronic kidney disease with heart failure and with stage 5 chronic kidney disease, or end stage renal disease: Secondary | ICD-10-CM | POA: Diagnosis not present

## 2022-02-28 DIAGNOSIS — I340 Nonrheumatic mitral (valve) insufficiency: Secondary | ICD-10-CM | POA: Diagnosis not present

## 2022-02-28 DIAGNOSIS — R791 Abnormal coagulation profile: Secondary | ICD-10-CM | POA: Diagnosis not present

## 2022-02-28 DIAGNOSIS — E669 Obesity, unspecified: Secondary | ICD-10-CM | POA: Diagnosis not present

## 2022-02-28 DIAGNOSIS — K8020 Calculus of gallbladder without cholecystitis without obstruction: Secondary | ICD-10-CM | POA: Diagnosis present

## 2022-02-28 DIAGNOSIS — K760 Fatty (change of) liver, not elsewhere classified: Secondary | ICD-10-CM | POA: Diagnosis not present

## 2022-02-28 DIAGNOSIS — N2889 Other specified disorders of kidney and ureter: Secondary | ICD-10-CM | POA: Diagnosis not present

## 2022-02-28 DIAGNOSIS — I700 Atherosclerosis of aorta: Secondary | ICD-10-CM | POA: Diagnosis not present

## 2022-02-28 DIAGNOSIS — D696 Thrombocytopenia, unspecified: Secondary | ICD-10-CM | POA: Diagnosis not present

## 2022-02-28 DIAGNOSIS — E1122 Type 2 diabetes mellitus with diabetic chronic kidney disease: Secondary | ICD-10-CM | POA: Diagnosis not present

## 2022-02-28 DIAGNOSIS — Z992 Dependence on renal dialysis: Secondary | ICD-10-CM | POA: Diagnosis not present

## 2022-02-28 DIAGNOSIS — R0902 Hypoxemia: Secondary | ICD-10-CM | POA: Diagnosis not present

## 2022-02-28 DIAGNOSIS — R933 Abnormal findings on diagnostic imaging of other parts of digestive tract: Secondary | ICD-10-CM | POA: Diagnosis not present

## 2022-02-28 DIAGNOSIS — I480 Paroxysmal atrial fibrillation: Secondary | ICD-10-CM | POA: Diagnosis not present

## 2022-02-28 DIAGNOSIS — I2510 Atherosclerotic heart disease of native coronary artery without angina pectoris: Secondary | ICD-10-CM | POA: Diagnosis not present

## 2022-02-28 DIAGNOSIS — H7093 Unspecified mastoiditis, bilateral: Secondary | ICD-10-CM | POA: Diagnosis not present

## 2022-02-28 DIAGNOSIS — F32A Depression, unspecified: Secondary | ICD-10-CM | POA: Diagnosis not present

## 2022-02-28 DIAGNOSIS — K6289 Other specified diseases of anus and rectum: Secondary | ICD-10-CM | POA: Diagnosis not present

## 2022-02-28 DIAGNOSIS — J90 Pleural effusion, not elsewhere classified: Secondary | ICD-10-CM | POA: Diagnosis not present

## 2022-02-28 DIAGNOSIS — I255 Ischemic cardiomyopathy: Secondary | ICD-10-CM | POA: Diagnosis not present

## 2022-02-28 DIAGNOSIS — R748 Abnormal levels of other serum enzymes: Secondary | ICD-10-CM | POA: Diagnosis not present

## 2022-02-28 DIAGNOSIS — R7401 Elevation of levels of liver transaminase levels: Secondary | ICD-10-CM | POA: Diagnosis not present

## 2022-02-28 DIAGNOSIS — D631 Anemia in chronic kidney disease: Secondary | ICD-10-CM | POA: Diagnosis not present

## 2022-02-28 DIAGNOSIS — N186 End stage renal disease: Secondary | ICD-10-CM | POA: Diagnosis not present

## 2022-02-28 DIAGNOSIS — K7460 Unspecified cirrhosis of liver: Secondary | ICD-10-CM | POA: Diagnosis not present

## 2022-02-28 DIAGNOSIS — I9589 Other hypotension: Secondary | ICD-10-CM | POA: Diagnosis not present

## 2022-02-28 LAB — BILIRUBIN, FRACTIONATED(TOT/DIR/INDIR)
Bilirubin, Direct: 1.5 mg/dL — ABNORMAL HIGH (ref 0.0–0.2)
Indirect Bilirubin: 1.7 mg/dL — ABNORMAL HIGH (ref 0.3–0.9)
Total Bilirubin: 3.2 mg/dL — ABNORMAL HIGH (ref 0.3–1.2)

## 2022-02-28 LAB — HEPATITIS PANEL, ACUTE
HCV Ab: NONREACTIVE
Hep A IgM: NONREACTIVE
Hep B C IgM: NONREACTIVE
Hepatitis B Surface Ag: NONREACTIVE

## 2022-02-28 LAB — PROTIME-INR
INR: 5.2 (ref 0.8–1.2)
Prothrombin Time: 47.7 seconds — ABNORMAL HIGH (ref 11.4–15.2)

## 2022-02-28 LAB — LIPASE, BLOOD: Lipase: 61 U/L — ABNORMAL HIGH (ref 11–51)

## 2022-02-28 LAB — LACTIC ACID, PLASMA: Lactic Acid, Venous: 1.9 mmol/L (ref 0.5–1.9)

## 2022-02-28 LAB — BRAIN NATRIURETIC PEPTIDE: B Natriuretic Peptide: >4500 pg/mL — ABNORMAL HIGH (ref 0.0–100.0)

## 2022-02-28 LAB — CBG MONITORING, ED: Glucose-Capillary: 87 mg/dL (ref 70–99)

## 2022-02-28 MED ORDER — VANCOMYCIN HCL IN DEXTROSE 1-5 GM/200ML-% IV SOLN
1000.0000 mg | Freq: Once | INTRAVENOUS | Status: AC
  Administered 2022-02-28: 1000 mg via INTRAVENOUS
  Filled 2022-02-28: qty 200

## 2022-02-28 MED ORDER — SODIUM CHLORIDE 0.9% FLUSH
3.0000 mL | Freq: Two times a day (BID) | INTRAVENOUS | Status: DC
  Administered 2022-02-28 – 2022-03-06 (×11): 3 mL via INTRAVENOUS

## 2022-02-28 MED ORDER — SODIUM CHLORIDE 0.9 % IV SOLN: 250.0000 mL | INTRAVENOUS | Status: DC | PRN

## 2022-02-28 MED ORDER — AMIODARONE HCL 200 MG PO TABS
200.0000 mg | ORAL_TABLET | Freq: Every day | ORAL | Status: DC
  Administered 2022-03-01: 200 mg via ORAL
  Filled 2022-02-28 (×2): qty 1

## 2022-02-28 MED ORDER — APIXABAN 5 MG PO TABS: 5.0000 mg | ORAL_TABLET | Freq: Two times a day (BID) | ORAL | Status: DC

## 2022-02-28 MED ORDER — SERTRALINE HCL 50 MG PO TABS
25.0000 mg | ORAL_TABLET | Freq: Every morning | ORAL | Status: DC
  Administered 2022-03-01 – 2022-03-06 (×6): 25 mg via ORAL
  Filled 2022-02-28 (×6): qty 1

## 2022-02-28 MED ORDER — PANTOPRAZOLE SODIUM 40 MG PO TBEC
40.0000 mg | DELAYED_RELEASE_TABLET | Freq: Two times a day (BID) | ORAL | Status: DC
  Administered 2022-03-01 – 2022-03-06 (×11): 40 mg via ORAL
  Filled 2022-02-28 (×11): qty 1

## 2022-02-28 MED ORDER — PIPERACILLIN-TAZOBACTAM 3.375 G IVPB 30 MIN
3.3750 g | Freq: Once | INTRAVENOUS | Status: AC
  Administered 2022-02-28: 3.375 g via INTRAVENOUS
  Filled 2022-02-28: qty 50

## 2022-02-28 MED ORDER — DICYCLOMINE HCL 20 MG PO TABS: 20.0000 mg | ORAL_TABLET | Freq: Two times a day (BID) | ORAL | 0 refills | Status: DC

## 2022-02-28 MED ORDER — ONDANSETRON HCL 4 MG/2ML IJ SOLN: 4.0000 mg | Freq: Four times a day (QID) | INTRAMUSCULAR | Status: DC | PRN

## 2022-02-28 MED ORDER — AMOXICILLIN-POT CLAVULANATE 875-125 MG PO TABS: 1.0000 | ORAL_TABLET | Freq: Two times a day (BID) | ORAL | 0 refills | Status: DC

## 2022-02-28 MED ORDER — MIDODRINE HCL 5 MG PO TABS
15.0000 mg | ORAL_TABLET | Freq: Three times a day (TID) | ORAL | Status: DC
  Administered 2022-02-28 – 2022-03-06 (×18): 15 mg via ORAL
  Filled 2022-02-28 (×17): qty 3

## 2022-02-28 MED ORDER — ACETAMINOPHEN 325 MG PO TABS
650.0000 mg | ORAL_TABLET | Freq: Four times a day (QID) | ORAL | Status: DC | PRN
  Filled 2022-02-28: qty 2

## 2022-02-28 MED ORDER — ONDANSETRON HCL 4 MG PO TABS: 4.0000 mg | ORAL_TABLET | Freq: Four times a day (QID) | ORAL | Status: DC | PRN

## 2022-02-28 MED ORDER — CHLORHEXIDINE GLUCONATE CLOTH 2 % EX PADS: 6.0000 | MEDICATED_PAD | Freq: Every day | CUTANEOUS | Status: DC

## 2022-02-28 MED ORDER — LORAZEPAM 0.5 MG PO TABS
0.5000 mg | ORAL_TABLET | Freq: Every day | ORAL | Status: DC | PRN
  Administered 2022-03-03: 0.5 mg via ORAL
  Filled 2022-02-28: qty 1

## 2022-02-28 MED ORDER — SODIUM CHLORIDE 0.9% FLUSH: 3.0000 mL | INTRAVENOUS | Status: DC | PRN

## 2022-02-28 MED ORDER — ACETAMINOPHEN 650 MG RE SUPP: 650.0000 mg | Freq: Four times a day (QID) | RECTAL | Status: DC | PRN

## 2022-02-28 MED ORDER — MESALAMINE 4 G RE ENEM
4.0000 g | ENEMA | Freq: Every day | RECTAL | Status: DC
  Administered 2022-02-28 – 2022-03-05 (×6): 4 g via RECTAL
  Filled 2022-02-28 (×7): qty 60

## 2022-02-28 MED ORDER — ATORVASTATIN CALCIUM 80 MG PO TABS: 80.0000 mg | ORAL_TABLET | Freq: Every day | ORAL | Status: DC

## 2022-02-28 MED ORDER — GABAPENTIN 100 MG PO CAPS
100.0000 mg | ORAL_CAPSULE | Freq: Every day | ORAL | Status: DC
  Administered 2022-02-28 – 2022-03-05 (×6): 100 mg via ORAL
  Filled 2022-02-28 (×6): qty 1

## 2022-02-28 NOTE — Plan of Care (Signed)
[facility] will assume care on arrival to accepting facility. Until arrival, care as per EDP. However, [facility] available 24/7 for questions and assistance.  Nursing staff, please page [facility] Admits and Consults ([phone]) as soon as the patient arrives to the hospital.   

## 2022-02-28 NOTE — Assessment & Plan Note (Signed)
Rate controlled Continue amiodarone, follow LFTs closely Continue eliquis

## 2022-02-28 NOTE — ED Provider Notes (Signed)
[facility]ERGENCY DEPARTMENT Provider Note   CSN: [id number] Arrival date & time: 02/27/22  1945     History  Chief Complaint  Patient presents with   Rectal Problems    Dalton Junior Rosenbloom is a 76 y.o. male.  The history is provided by the patient.  Illness Location:  Rectum Quality:  Pain sent in for rule out fistula Severity:  Moderate Onset quality:  Gradual Timing:  Constant Progression:  Unchanged Chronicity:  New Context:  Was at doctors office for check of hemorrhoids Relieved by:  Nothing Worsened by:  Nothing Ineffective treatments:  Miralax Associated symptoms: abdominal pain   Associated symptoms: no diarrhea, no fever, no nausea, no vomiting and no wheezing   Associated symptoms comment:  BMs 3-4 a day and pain  Risk factors:  ESRD MWF Patient with ESRD presents with abdominal pain, rectal pain and frequent stools.  He was seen by his PMD for this hemorrhoids and pain.  There was a concern for fistula.      Past Medical History:  Diagnosis Date   Arthritis    Chronic kidney disease, stage 3b ([place])    ESRD (end stage renal disease) ([place])    ETOH abuse    History of stress test 09/02/2008   showed inferolateral scar without ischemia   Hx of echocardiogram 09/02/2008   was essentially normal   Hyperlipidemia    Hypertension    Myocardial infarction [facility])    Tobacco abuse     Home Medications Prior to Admission medications   Medication Sig Start Date End Date Taking? Authorizing Provider  amoxicillin-clavulanate (AUGMENTIN) 875-125 MG tablet Take 1 tablet by mouth every 12 (twelve) hours. 02/28/22  Yes Ayaan Shutes, MD  dicyclomine (BENTYL) 20 MG tablet Take 1 tablet (20 mg total) by mouth 2 (two) times daily. 02/28/22  Yes Jocie Meroney, MD  acetaminophen (TYLENOL) 650 MG CR tablet Take 1,300 mg by mouth every 8 (eight) hours as needed for pain.    [provider]  albuterol (PROVENTIL) (2.5 MG/3ML) 0.083% nebulizer  solution Take 3 mLs (2.5 mg total) by nebulization every 4 (four) hours as needed for wheezing or shortness of breath. 04/03/21 04/03/22  [name], FNP  amiodarone (PACERONE) 200 MG tablet Take 1 tablet (200 mg total) by mouth daily. 11/11/21   Larey Dresser, MD  apixaban (ELIQUIS) 5 MG TABS tablet Take 1 tablet (5 mg total) by mouth 2 (two) times daily. 08/26/21   [place], Maricela Bo, FNP  atorvastatin (LIPITOR) 80 MG tablet Take 1 tablet (80 mg total) by mouth daily. Patient taking differently: Take 80 mg by mouth at bedtime. 11/21/21   Joette Catching, PA-C  cholecalciferol (VITAMIN D3) 25 MCG (1000 UNIT) tablet Take 1,000 Units by mouth in the morning.    [provider]  ethyl chloride spray Apply 1 Application topically as needed (prior to dialysis).    [provider]  gabapentin (NEURONTIN) 100 MG capsule Take 100 mg by mouth at bedtime. 11/14/21   [provider]  hydrocortisone (ANUSOL-HC) 25 MG suppository Place 1 suppository (25 mg total) rectally 2 (two) times daily. Patient taking differently: Place 25 mg rectally 2 (two) times daily as needed for hemorrhoids. 11/29/21   Carlisle Cater, PA-C  hydrOXYzine (ATARAX) 25 MG tablet Take 25 mg by mouth daily. 02/24/22   [provider]  loratadine (CLARITIN) 10 MG tablet Take 10 mg by mouth every other day.    [provider]  LORazepam (  ATIVAN) 0.5 MG tablet Take 0.5 mg by mouth as needed. 02/12/22   [provider]  magnesium oxide (MAG-OX) 400 (240 Mg) MG tablet Take 400 mg by mouth daily in the afternoon. 07/05/21   [provider]  melatonin 5 MG TABS Take 5-10 mg by mouth at bedtime as needed (sleep).    [provider]  metoCLOPramide (REGLAN) 5 MG tablet Take 1 tablet (5 mg total) by mouth every 8 (eight) hours as needed for nausea. 07/12/21   Couture, Cortni S, PA-C  midodrine (PROAMATINE) 10 MG tablet Take 1.5 tablets (15 mg total) by mouth 3 (three) times  daily with meals. 11/21/21   Joette Catching, PA-C  nitroGLYCERIN (NITROSTAT) 0.4 MG SL tablet Place 1 tablet (0.4 mg total) under the tongue every 5 (five) minutes as needed for chest pain. 05/28/21 05/28/22  Clegg, Amy D, NP  NONFORMULARY OR COMPOUNDED ITEM Place 1 Application rectally 2 (two) times daily as needed for hemorrhoids. Diltiazem 2% Ointment 11/29/21   [provider]  ondansetron (ZOFRAN) 4 MG tablet Take 1 tablet (4 mg total) by mouth daily as needed for nausea or vomiting. 06/04/21 06/04/22  [name], FNP  pantoprazole (PROTONIX) 40 MG tablet Take 1 tablet (40 mg total) by mouth 2 (two) times daily. 02/05/22   Dessa Phi, DO  phenylephrine (,USE FOR PREPARATION-H,) 0.25 % suppository Place 1 suppository rectally 2 (two) times daily as needed for hemorrhoids.    [provider]  polyethylene glycol powder (MIRALAX) 17 GM/SCOOP powder Take 17 g by mouth daily. Make be given every other day as well    [provider]  prochlorperazine (COMPAZINE) 5 MG tablet Take 5 mg by mouth 3 (three) times daily as needed for nausea or vomiting. 09/05/21   [provider]  Propylene Glycol (SYSTANE COMPLETE) 0.6 % SOLN Place 1 drop into both eyes 3 (three) times daily as needed (dry/irritated eyes.).    [provider]  sertraline (ZOLOFT) 25 MG tablet Take 25 mg by mouth in the morning. 10/23/21   [provider]      Allergies    Other    Review of Systems   Review of Systems  Constitutional:  Negative for fever.  HENT:  Negative for facial swelling.   Eyes:  Negative for redness.  Respiratory:  Negative for wheezing.   Cardiovascular:  Negative for leg swelling.  Gastrointestinal:  Positive for abdominal pain and rectal pain. Negative for diarrhea, nausea and vomiting.       Rectal pain   Neurological:  Negative for facial asymmetry.  All other systems reviewed and are negative.   Physical Exam Updated Vital Signs BP 96/62  (BP Location: Right Arm)   Pulse 92   Temp 97.9 F (36.6 C) (Oral)   Resp 16   Ht '5\' 10"'$  (1.778 m)   Wt 70.3 kg   SpO2 94%   BMI 22.24 kg/m  Physical Exam Vitals and nursing note reviewed.  Constitutional:      General: He is not in acute distress.    Appearance: He is well-developed. He is not diaphoretic.  HENT:     Head: Normocephalic and atraumatic.     Nose: Nose normal.  Eyes:     Conjunctiva/sclera: Conjunctivae normal.     Pupils: Pupils are equal, round, and reactive to light.  Cardiovascular:     Rate and Rhythm: Normal rate and regular rhythm.     Pulses: Normal pulses.  Heart sounds: Normal heart sounds.  Pulmonary:     Effort: Pulmonary effort is normal.     Breath sounds: Normal breath sounds. No wheezing or rales.  Abdominal:     General: Bowel sounds are normal.     Palpations: Abdomen is soft.     Tenderness: There is no abdominal tenderness. There is no guarding or rebound.  Musculoskeletal:        General: Normal range of motion.     Cervical back: Normal range of motion and neck supple.  Skin:    General: Skin is warm and dry.     Capillary Refill: Capillary refill takes less than 2 seconds.  Neurological:     General: No focal deficit present.     Mental Status: He is alert and oriented to person, place, and time.     Deep Tendon Reflexes: Reflexes normal.  Psychiatric:        Mood and Affect: Mood normal.        Behavior: Behavior normal.     ED Results / Procedures / Treatments   Labs (all labs ordered are listed, but only abnormal results are displayed) Results for orders placed or performed during the hospital encounter of 02/27/22  CBC with Differential  Result Value Ref Range   WBC 7.2 4.0 - 10.5 K/uL   RBC 3.30 (L) 4.22 - 5.81 MIL/uL   Hemoglobin 10.4 (L) 13.0 - 17.0 g/dL   HCT 30.5 (L) 39.0 - 52.0 %   MCV 92.4 80.0 - 100.0 fL   MCH 31.5 26.0 - 34.0 pg   MCHC 34.1 30.0 - 36.0 g/dL   RDW 22.6 (H) 11.5 - 15.5 %   Platelets 126  (L) 150 - 400 K/uL   nRBC 0.4 (H) 0.0 - 0.2 %   Neutrophils Relative % 83 %   Neutro Abs 6.0 1.7 - 7.7 K/uL   Lymphocytes Relative 10 %   Lymphs Abs 0.7 0.7 - 4.0 K/uL   Monocytes Relative 6 %   Monocytes Absolute 0.4 0.1 - 1.0 K/uL   Eosinophils Relative 0 %   Eosinophils Absolute 0.0 0.0 - 0.5 K/uL   Basophils Relative 0 %   Basophils Absolute 0.0 0.0 - 0.1 K/uL   WBC Morphology MORPHOLOGY UNREMARKABLE    RBC Morphology See Note    Smear Review Normal platelet morphology    Immature Granulocytes 1 %   Abs Immature Granulocytes 0.04 0.00 - 0.07 K/uL   Acanthocytes PRESENT    Tear Drop Cells PRESENT    Burr Cells PRESENT    Polychromasia PRESENT    Target Cells PRESENT   Comprehensive metabolic panel  Result Value Ref Range   Sodium 139 135 - 145 mmol/L   Potassium 3.9 3.5 - 5.1 mmol/L   Chloride 94 (L) 98 - 111 mmol/L   CO2 31 22 - 32 mmol/L   Glucose, Bld 99 70 - 99 mg/dL   BUN 34 (H) 8 - 23 mg/dL   Creatinine, Ser 6.57 (H) 0.61 - 1.24 mg/dL   Calcium 9.7 8.9 - 10.3 mg/dL   Total Protein 6.9 6.5 - 8.1 g/dL   Albumin 3.7 3.5 - 5.0 g/dL   AST 231 (H) 15 - 41 U/L   ALT 126 (H) 0 - 44 U/L   Alkaline Phosphatase 115 38 - 126 U/L   Total Bilirubin 3.0 (H) 0.3 - 1.2 mg/dL   GFR, Estimated 8 (L) >60 mL/min   Anion gap 14 5 - 15  Lactic  acid, plasma  Result Value Ref Range   Lactic Acid, Venous 2.1 (HH) 0.5 - 1.9 mmol/L   CT ABDOMEN PELVIS W CONTRAST  Result Date: 02/27/2022 CLINICAL DATA:  Abdominal pain, acute nonlocalized. Concern for rectal abscess. EXAM: CT ABDOMEN AND PELVIS WITH CONTRAST TECHNIQUE: Multidetector CT imaging of the abdomen and pelvis was performed using the standard protocol following bolus administration of intravenous contrast. RADIATION DOSE REDUCTION: This exam was performed according to the departmental dose-optimization program which includes automated exposure control, adjustment of the mA and/or kV according to patient size and/or use of iterative  reconstruction technique. CONTRAST:  169m OMNIPAQUE IOHEXOL 300 MG/ML  SOLN COMPARISON:  11/29/2021 FINDINGS: Lower chest: Small bilateral pleural effusions with basilar atelectasis, greater on the left. Hazy interstitial pattern to the lung bases likely representing edema. Cardiac enlargement. Coronary artery calcifications. Hepatobiliary: Subcentimeter low-attenuation lesions in the liver are too small to characterize but likely represent small cysts or hemangiomas. No change since prior study. No imaging follow-up is indicated. Probable hepatic cirrhosis with enlarged lateral segment of the left lobe of the liver. Cholelithiasis with multiple stones filling the gallbladder. Gallbladder is contracted. No bile duct dilatation. Pancreas: Unremarkable. No pancreatic ductal dilatation or surrounding inflammatory changes. Spleen: Normal in size without focal abnormality. Adrenals/Urinary Tract: No adrenal gland nodules. Renal nephrograms are delayed consistent with history of renal insufficiency. Multiple bilateral renal cysts, largest on the right measuring 2.2 cm diameter. No imaging follow-up is indicated. No hydronephrosis or hydroureter. Bladder is normal. Stomach/Bowel: Stomach, small bowel, and colon are not abnormally distended. Mild scattered hyperdense material likely representing ingested material. Diverticulosis of the sigmoid colon without evidence of acute diverticulitis. Appendix is normal. The anorectal wall appears diffusely thickened without pouching of the lumen over the thickened segment. This could represent proctitis or rectal mass. Direct visualization is suggested. Vascular/Lymphatic: Diffuse aortic calcification. Scattered mural thrombus. No aneurysm or dissection. Focal stenosis of the right external iliac artery likely representing about 70% diameter reduction. No significant lymphadenopathy. Reproductive: Prostate gland is unremarkable. Other: Moderate diffuse abdominal and pelvic free  fluid, likely ascites. Edema throughout the subcutaneous soft tissues. No loculated collections. No free air. Musculoskeletal: Degenerative changes. No destructive bone lesions. Mild diffuse bone sclerosis may represent renal osteodystrophy. Curvilinear sclerosis in the right femoral head suggesting avascular necrosis. IMPRESSION: 1. No perirectal abscess is identified but there is diffuse wall thickening of the low rectum just above the anus. This could represent proctitis or rectal mass. Direct visualization is suggested. 2. Hepatic cirrhosis. 3. Cholelithiasis with multiple stones filling the gallbladder. 4. Small bilateral pleural effusions. Diffuse abdominal and pelvic ascites. Diffuse soft tissue edema. 5. Aortic atherosclerosis with significant focal stenosis demonstrated in the right external iliac artery. 6. Cardiac enlargement with probable edema in the lung bases. Electronically Signed   By: WLucienne CapersM.D.   On: 02/27/2022 22:33   Nocturnal polysomnography  Result Date: 02/19/2022 AStar Age MD     02/24/2022  5:11 PM Physician Interpretation:  [facility]  INTERPRETATION REPORT STUDY DATE:  02/19/2022  PATIENT NAME:  RTanna Furry Conteh        DATE OF BIRTH:  [birth year]PATIENT ID:  [id number]   TYPE OF STUDY:  PSG READING PHYSICIAN: SStar Age MD, [name] REFERRED BY: Dr. ECollene LeydenSCORING TECHNICIAN: MGaylyn Cheers RPSGT HISTORY: 76year old male with an underlying complex medical history of end-stage renal disease, on hemodialysis, history of alcohol use disorder by chart review, hypertension, hyperlipidemia, coronary  artery disease with history of MI, A-fib, CHF, chronic respiratory failure with hypoxia, arthritis, and prior?smoking, who reports intermittent trembling affecting his hands and feet.?He does not sleep very well, he snores, he has never had a sleep study, he is on supplemental oxygen at night at 2 L/min per cardiology.  Height: 71 in Weight: 154 lb (BMI 21) Neck Size: 0  MEDICATIONS: Tylenol, Proventil, Xanax, Pacerone, Eliquis, Lipitor, Zyrtec, Vitamin D3, Valium, Neurontin, Anusol-HC, Atarax, Emla, Mag-Ox, Reglan, Proamatine, Nitrostat, Prilosec, Zofran, Preperation H, Miralax, Compazine, Zoloft, Multivitamins, Remeron TECHNICAL DESCRIPTION: A registered sleep technologist was in attendance for the duration of the recording.  Data collection, scoring, video monitoring, and reporting were performed in compliance with the AASM Manual for the Scoring of Sleep and Associated Events; (Hypopnea is scored based on the criteria listed in Section VIII D. 1b in the AASM Manual V2.6 using a 4% oxygen desaturation rule or Hypopnea is scored based on the criteria listed in Section VIII D. 1a in the AASM Manual V2.6 using 3% oxygen desaturation and /or arousal rule). SLEEP CONTINUITY AND SLEEP ARCHITECTURE:  Lights-out was at 22:13: and lights-on at  04:37:, with a total recording time of 6 hours, 24 min . Total sleep time ( TST) was 54.5 minutes with a markedly low sleep efficiency of 14.2%.  BODY POSITION:  TST was divided  between the following sleep positions: 22.9% supine;  72.5% lateral;  0% prone. Duration of total sleep and percent of total sleep in their respective position is as follows: supine 12 minutes (23%), non-supine 42 minutes (77%); right 16 minutes (29%), left 23 minutes (43%), and prone 00 minutes (0%). Total supine REM sleep time was 00 minutes (0% of total REM sleep). Sleep latency was normal at 18.5 minutes.  REM sleep latency was markedly delayed at 363.5 minutes. Of the total sleep time, the percentage of stage N1 sleep was 72.5%, which is markedly increased, stage N2 sleep was 26%, which is reduced, stage N3 sleep was absent, and REM sleep was nearly absent at 1.8%. Wake after sleep onset (WASO) time accounted for 311 minutes with poor sleep consolidation. RESPIRATORY MONITORING:  Based on CMS criteria (using a 4%  oxygen desaturation rule for scoring hypopneas), there were 8 apneas (5 obstructive; 1 central; 2 mixed), and 20 hypopneas.  Apnea index was 8.8. Hypopnea index was 22.0. The apnea-hypopnea index was 30.8/hour overall (48.0 supine, 0 non-supine; 0.0 REM, 0.0 supine REM).  There were 0 respiratory effort-related arousals (RERAs).  The RERA index was 0 events/h. Total respiratory disturbance index (RDI) was 30.8 events/h. RDI results showed: supine RDI  48.0 /h; non-supine RDI 25.7 /h; REM RDI 0.0 /h, supine REM RDI 0.0 /h. Based on AASM criteria (using a 3% oxygen desaturation and /or arousal rule for scoring hypopneas), there were 8 apneas (5 obstructive; 1 central; 2 mixed), and 21 hypopneas. Apnea index was 8.8. Hypopnea index was 23.1. The apnea-hypopnea index was 31.9 overall (48.0 supine, 0 non-supine; 0.0 REM, 0.0 supine REM).  There were 0 respiratory effort-related arousals (RERAs).  The RERA index was 0 events/h. Total respiratory disturbance index (RDI) was 31.9 events/h. RDI results showed: supine RDI  48.0 /h; non-supine RDI 27.1 /h; REM RDI 0.0 /h, supine REM RDI 0.0 /h. OXIMETRY: Oxyhemoglobin Saturation Nadir during sleep was at  66%) from a mean of 91%. The study was started without supplemental oxygen. Of the Total sleep time (TST)   hypoxemia (=<88%) was present for  10.2 minutes, or 18.7% of total  sleep time. Supplemental oxygen was started at 1 L/min at 3:14 AM. His baseline O2 saturation improved mildly. LIMB MOVEMENTS: There were 62 periodic limb movements of sleep (68.3/hr), of which 7 (7.7/hr) were associated with an arousal. AROUSAL: There were 38 arousals in total, for an arousal index of 42 arousals/hour.  Of these, 11 were identified as respiratory-related arousals (12 /h), 7 were PLM-related arousals (8 /h), and 30 were non-specific arousals (33 /h). EEG: Review of the EEG showed no abnormal electrical discharges and symmetrical bihemispheric findings.  EKG: The EKG revealed normal sinus  rhythm (NSR) with . The average heart rate during sleep was 91 bpm. Intermittent PVCs (premature ventricular contractions) were noted. AUDIO/VIDEO REVIEW: The audio and video review did not show any abnormal or unusual behaviors, movements, phonations or vocalizations. The patient was very restless. The patient took 1 restroom break. Snoring was intermittent and mild. POST-STUDY QUESTIONNAIRE: Post study, the patient indicated, that sleep was the same as usual. The patient reported to the sleep technician that he does have frequent nights similar to this, especially after having his dialysis. IMPRESSION: 1. Poor sleep pattern 2. Obstructive sleep apnea (OSA) 3. Dysfunctions associated with sleep stages or arousal from sleep 4. PLMD (periodic limb movement disorder) 5. Non-specific abnormal electrocardiogram (EKG) RECOMMENDATIONS: 1. This study was very limited secondary to poor sleep efficiency, and poor sleep consolidation.  During the limited amount of total sleep time of less than 1 hour, there was evidence of obstructive sleep disordered breathing.  I would recommend a trial of AutoPap therapy without supplemental oxygen. Oxygen can be added if need be later on, I would recommend a pulse oximetry test while on AutoPap therapy, once patient is comfortable using AutoPAP. Given the patient's complex medical history and sleep related complaints, treatment with positive airway pressure is recommended. Please note, that untreated obstructive sleep apnea may carry additional perioperative morbidity. Patients with significant obstructive sleep apnea should receive perioperative PAP therapy and the surgeons and particularly the anesthesiologist should be informed of the diagnosis and the severity of the sleep disordered breathing. 2. Severe PLMs (periodic limb movements of sleep) were noted during this study with mild arousals; clinical correlation is recommended.  PLMS may improve with PAP therapy.  Medication effect  from the antidepressant medication should be considered. 3. The study showed occasional PVCs on single lead EKG; clinical correlation is recommended. The patient is well-known to cardiology. 4. This study shows significant sleep fragmentation and abnormal sleep stage percentages; these are nonspecific findings and per se do not signify an intrinsic sleep disorder or a cause for the patient's sleep-related symptoms. Causes include (but are not limited to) the first night effect of the sleep study, circadian rhythm disturbances, medication effect or an underlying mood disorder or medical problem. 5. The patient should be cautioned not to drive, work at heights, or operate dangerous or heavy equipment when tired or sleepy. Review and reiteration of good sleep hygiene measures should be pursued with any patient. 6. The patient will be seen in follow-up by Dr. Rexene Alberts at [facility] for discussion of the test results and further management strategies. The referring provider will be notified of the test results. I certify that I have reviewed the entire raw data recording prior to the issuance of this report in accordance with the Standards of Accreditation of the American Academy of Sleep Medicine (AASM). [name], MD, [name] [facility]) [place], ABPN (Neurology and Sleep)   Technical Report: General Information Name: Luigi, Stuckey  Brooke Bonito. BMI: 21.48 Physician: [name], MD ID: [id number] Height: 71.0 in Technician: Gaylyn Cheers, RPSGT Sex: Male Weight: 154.0 lb Record: [id number] Age: 37 [20-Dec-1945] Date: 02/19/2022   Medical & Medication History   Mr. Dobie is a 76 year old right-handed gentleman with an underlying complex medical history of end-stage renal disease, on hemodialysis, history of alcohol use disorder by chart review, hypertension, hyperlipidemia, coronary artery disease with history of MI, A-fib, CHF, chronic respiratory failure with hypoxia, arthritis, and prior smoking, who  reports intermittent trembling affecting his hands and feet. Symptoms started after his heart attack in December 2022. He has had more stress he admits. His primary care physician, Dr. Doristine Bosworth started him recently on sertraline 25 mg strength once daily. He has been on it for about 2 months. He has had several different medication trials. Gabapentin 100 mg at bedtime did not help his tremors. He does have trouble sleeping. He has a prescription for Xanax and Valium but these are not currently active and were only prescribed for a procedure or as needed use before dialysis. He does have a prescription for mirtazapine, 15 mg as well as 30 mg, he currently does not take any of these. He has a prescription for Reglan which he uses infrequently. He may go to bed around 10 PM but may not be asleep until the early morning hours. Rise time varies. He can drink up to 2 L of water per day but does not drink that much. He does not drink any caffeine, has stopped drinking alcohol in December 2022 and does admit to having history of alcohol abuse. He quit smoking also in December 2022. He lives with his wife. He is the oldest of 10 children, none of his siblings has a tremor. He does not recall his parents having a tremor. None of his kids have a tremor, he has 3 boys and 1 daughter. He does not sleep very well, he snores, he has never had a sleep study, he is on supplemental oxygen at night at 2 L/min per cardiology. He has been on hemodialysis since February or March 2023. He does not have any nocturia, does not actually have much in the way of urine production any longer. Tylenol, Proventil, Xanax, Pacerone, Eliquis, Lipitor, Zyrtec, Vitamin D3, Valium, Neurontin, Anusol-HC, Atarax, Emla, Mag-Ox, Reglan, Proamatine, Nitrostat, Prilosec, Zofran, Preperation H, Miralax, Compazine, Zoloft, Multivitamins, Remeron  Sleep Disorder    Comments  Patient arrived for a diagnostic polysomnogram. Procedure explained and all questions  answered. Standard paste setup without complications. Patient quite restless and had significantly reduced sleep efficiency and delay to persistent sleep. A lot of micro sleep. Patient reports having frequent nights like this, especially after having his dialysis appointments. Patient attempted to sleep supine, right, and left. Occasional mild audible snoring was heard. Respiratory events observed. 1/LPM oxygen was added at 03:14 to raise his SAO2 baseline. Occasional PVC's observed. Patient has a known cardiac history. No significant PLMS observed. One restroom visit.  Lights out: 10:13:05 PM Lights on: 04:37:09 AM Time Total Supine Side Prone Upright Recording (TRT) 6h 24.49m1h 44.553mh 48.51m12m 0.557m 13m51.557m [id number] (TST) 0h 54.51m 0h8m.51m 0h 54m51m 0h 048m 0h 2.66mLatenc42m1 N2 N3 REM Onset Per. Slp. Eff. Actual 0h 0.557m 0h 9.51m451m 0.557m 53m3.51m 049m8.51m 072m.557m 14.651m Stg D23mWake N1 N2 N3 REM Total 329.5 39.5 14.0 0.0 1.0 Supine 92.0 12.5 0.0 0.0 0.0 Side 189.0 24.5 14.0 0.0 1.0 Prone 0.0 0.0 0.0  0.0 0.0 Upright 48.5 2.5 0.0 0.0 0.0  Stg % Wake N1 N2 N3 REM Total 85.8 72.5 25.7 0.0 1.8 Supine 24.0 22.9 0.0 0.0 0.0 Side 49.2 45.0 25.7 0.0 1.8 Prone 0.0 0.0 0.0 0.0 0.0 Upright 12.6 4.6 0.0 0.0 0.0  Apnea Summary Sub Supine Side Prone Upright Total 8 Total '8 4 4 '$ 0 0   REM 0 0 0 0 0   NREM '8 4 4 '$ 0 0 Obs 5 REM 0 0 0 0 0   NREM '5 4 1 '$ 0 0 Mix 2 REM 0 0 0 0 0   NREM 2 0 2 0 0 Cen 1 REM 0 0 0 0 0   NREM 1 0 1 0 0 Rera Summary Sub Supine Side Prone Upright Total 0 Total 0 0 0 0 0   REM 0 0 0 0 0   NREM 0 0 0 0 0  Hypopnea Summary Sub Supine Side Prone Upright Total 21 Total '21 6 13 '$ 0 2   REM 0 0 0 0 0   NREM '21 6 13 '$ 0 2 4% Hypopnea Summary Sub Supine Side Prone Upright Total (4%) 20 Total '20 6 12 '$ 0 2   REM 0 0 0 0 0   NREM '20 6 12 '$ 0 2  AHI Total Obs Mix Cen 31.93 Apnea 8.81 5.50 2.20 1.10  Hypopnea 23.12 -- -- -- 30.83 Hypopnea (4%) 22.02 -- -- --  Total Supine Side Prone Upright Position AHI 31.93 48.00 25.82 0.00 48.00 REM  AHI 0.00  NREM AHI 32.52  Position RDI 31.93 48.00 25.82 0.00 48.00 REM RDI 0.00  NREM RDI 32.52  4% Hypopnea Total Supine Side Prone Upright Position AHI (4%) 30.83 48.00 24.30 0.00 48.00 REM AHI (4%) 0.00  NREM AHI (4%) 31.40  Position RDI (4%) 30.83 48.00 24.30 0.00 48.00 REM RDI (4%) 0.00  NREM RDI (4%) 31.40  Desaturation Information Threshold: 2% <100% <90% <80% <70% <60% <50% <40% Supine 111.0 67.0 1.0 0.0 0.0 0.0 0.0 Side 256.0 176.0 2.0 0.0 0.0 0.0 0.0 Prone 0.0 0.0 0.0 0.0 0.0 0.0 0.0 Upright 68.0 53.0 1.0 0.0 0.0 0.0 0.0 Total 435.0 296.0 4.0 0.0 0.0 0.0 0.0 Index 71.6 48.7 0.7 0.0 0.0 0.0 0.0 Threshold: 3% <100% <90% <80% <70% <60% <50% <40% Supine 101.0 66.0 1.0 0.0 0.0 0.0 0.0 Side 233.0 173.0 2.0 0.0 0.0 0.0 0.0 Prone 0.0 0.0 0.0 0.0 0.0 0.0 0.0 Upright 67.0 53.0 1.0 0.0 0.0 0.0 0.0 Total 401.0 292.0 4.0 0.0 0.0 0.0 0.0 Index 66.0 48.1 0.7 0.0 0.0 0.0 0.0 Threshold: 4% <100% <90% <80% <70% <60% <50% <40% Supine 91.0 65.0 1.0 0.0 0.0 0.0 0.0 Side 218.0 168.0 2.0 0.0 0.0 0.0 0.0 Prone 0.0 0.0 0.0 0.0 0.0 0.0 0.0 Upright 66.0 53.0 1.0 0.0 0.0 0.0 0.0 Total 375.0 286.0 4.0 0.0 0.0 0.0 0.0 Index 61.7 47.1 0.7 0.0 0.0 0.0 0.0 Threshold: 3% <100% <90% <80% <70% <60% <50% <40% Supine 101 66 1 0 0 0 0 Side 233 173 2 0 0 0 0 Prone 0 0 0 0 0 0 0 Upright 67 53 1 0 0 0 0 Total 401 292 4 0 0 0 0  Awakening/Arousal Information # of Awakenings 30 Wake after sleep onset 311.2mWake after persistent sleep 0.015[facility] Arousals Index Apneas 2 2.2 Hypopneas 9 9.9 Leg Movements 13 14.3 Snore 0 0.0 PTT Arousals 0 0.0 Spontaneous 32 35.2 Total 56 61.7 Leg Movement Information PLMS LMs Index Total  LMs during PLMS 62 68.3 LMs w/ Microarousals 7 7.7 LM LMs Index w/ Microarousal 5 5.5 w/ Awakening 3 3.3 w/ Resp Event 0 0.0 Spontaneous 8 8.8 Total 13 14.3  Desaturation threshold setting: 3% Minimum desaturation setting: 10 seconds SaO2 nadir: 64% The longest event was a 26 sec obstructive Hypopnea with a minimum SaO2 of  87%. The lowest SaO2 was 79% associated with a 11 sec obstructive Hypopnea. EKG Rates EKG Avg Max Min Awake 91 128 76 Asleep 91 132 77 EKG Events: Tachycardia    None  Radiology CT ABDOMEN PELVIS W CONTRAST  Result Date: 02/27/2022 CLINICAL DATA:  Abdominal pain, acute nonlocalized. Concern for rectal abscess. EXAM: CT ABDOMEN AND PELVIS WITH CONTRAST TECHNIQUE: Multidetector CT imaging of the abdomen and pelvis was performed using the standard protocol following bolus administration of intravenous contrast. RADIATION DOSE REDUCTION: This exam was performed according to the departmental dose-optimization program which includes automated exposure control, adjustment of the mA and/or kV according to patient size and/or use of iterative reconstruction technique. CONTRAST:  186m OMNIPAQUE IOHEXOL 300 MG/ML  SOLN COMPARISON:  11/29/2021 FINDINGS: Lower chest: Small bilateral pleural effusions with basilar atelectasis, greater on the left. Hazy interstitial pattern to the lung bases likely representing edema. Cardiac enlargement. Coronary artery calcifications. Hepatobiliary: Subcentimeter low-attenuation lesions in the liver are too small to characterize but likely represent small cysts or hemangiomas. No change since prior study. No imaging follow-up is indicated. Probable hepatic cirrhosis with enlarged lateral segment of the left lobe of the liver. Cholelithiasis with multiple stones filling the gallbladder. Gallbladder is contracted. No bile duct dilatation. Pancreas: Unremarkable. No pancreatic ductal dilatation or surrounding inflammatory changes. Spleen: Normal in size without focal abnormality. Adrenals/Urinary Tract: No adrenal gland nodules. Renal nephrograms are delayed consistent with history of renal insufficiency. Multiple bilateral renal cysts, largest on the right measuring 2.2 cm diameter. No imaging follow-up is indicated. No hydronephrosis or hydroureter. Bladder is normal. Stomach/Bowel:  Stomach, small bowel, and colon are not abnormally distended. Mild scattered hyperdense material likely representing ingested material. Diverticulosis of the sigmoid colon without evidence of acute diverticulitis. Appendix is normal. The anorectal wall appears diffusely thickened without pouching of the lumen over the thickened segment. This could represent proctitis or rectal mass. Direct visualization is suggested. Vascular/Lymphatic: Diffuse aortic calcification. Scattered mural thrombus. No aneurysm or dissection. Focal stenosis of the right external iliac artery likely representing about 70% diameter reduction. No significant lymphadenopathy. Reproductive: Prostate gland is unremarkable. Other: Moderate diffuse abdominal and pelvic free fluid, likely ascites. Edema throughout the subcutaneous soft tissues. No loculated collections. No free air. Musculoskeletal: Degenerative changes. No destructive bone lesions. Mild diffuse bone sclerosis may represent renal osteodystrophy. Curvilinear sclerosis in the right femoral head suggesting avascular necrosis. IMPRESSION: 1. No perirectal abscess is identified but there is diffuse wall thickening of the low rectum just above the anus. This could represent proctitis or rectal mass. Direct visualization is suggested. 2. Hepatic cirrhosis. 3. Cholelithiasis with multiple stones filling the gallbladder. 4. Small bilateral pleural effusions. Diffuse abdominal and pelvic ascites. Diffuse soft tissue edema. 5. Aortic atherosclerosis with significant focal stenosis demonstrated in the right external iliac artery. 6. Cardiac enlargement with probable edema in the lung bases. Electronically Signed   By: WLucienne CapersM.D.   On: 02/27/2022 22:33    Procedures Procedures    Medications Ordered in ED Medications  amoxicillin-clavulanate (AUGMENTIN) 875-125 MG per tablet 1 tablet (has no administration in time range)  dicyclomine (BENTYL) injection 20 mg (  has no  administration in time range)  ketorolac (TORADOL) injection 15 mg (has no administration in time range)  alum & mag hydroxide-simeth (MAALOX/MYLANTA) 200-200-20 MG/5ML suspension 30 mL (has no administration in time range)  iohexol (OMNIPAQUE) 300 MG/ML solution 100 mL (100 mLs Intravenous Contrast Given 02/27/22 2209)    ED Course/ Medical Decision Making/ A&P                           Medical Decision Making Patient with rectal pain seen for hemorrhoids and PMD sent in for rule out fistula.  Sees Dr. Benson [place] of GI  Problems Addressed: Proctitis:    Details: Will start augmentin and refer back to patient's GI Rectal mass:    Details: Needs colonoscopy family informed of need to follow up with GI  Amount and/or Complexity of Data Reviewed Independent Historian:     Details: Daughter see above  External Data Reviewed: notes.    Details: Outside notes reviewed  Labs: ordered.    Details: All labs reviewed:  normal white count 7.2, hemoglobin low 10.4, normal platelet count.  Sodium normal 139, potassium normal 3.9, elevated creatinine in patient with ESRD 6.57 Radiology: ordered and independent interpretation performed.    Details: Rectal lesions by me  Discussion of management or test interpretation with external provider(s): Dr. Marlou Starks consulted regarding LFTs and GB,  admit to medicine and further work up and testing.   Dr. Benson Norway consulted via secure chat.   Dr. Marlowe Sax will admit   Risk OTC drugs. Prescription drug management. Decision regarding hospitalization.    Final Clinical Impression(s) / ED Diagnoses Final diagnoses:  Rectal mass  Proctitis  The patient appears reasonably stabilized for admission considering the current resources, flow, and capabilities available in the ED at this time, and I doubt any other [facility] requiring further screening and/or treatment in the ED prior to admission.   Rx / DC Orders ED Discharge Orders          Ordered     amoxicillin-clavulanate (AUGMENTIN) 875-125 MG tablet  Every 12 hours        02/28/22 0022    dicyclomine (BENTYL) 20 MG tablet  2 times daily        02/28/22 0023              Vega Withrow, MD 02/28/22 2778

## 2022-02-28 NOTE — ED Notes (Signed)
Pt placed on 2L for O2 saturation in 80s. Pt reports being on 2L [place] at home at night.

## 2022-02-28 NOTE — ED Notes (Signed)
Provider is aware of patients blood pressures running low

## 2022-02-28 NOTE — Assessment & Plan Note (Signed)
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  Not candidate for Mitraclip per structural heat team due to poor functional capacity and challenging imaging.

## 2022-02-28 NOTE — H&P (Addendum)
History and Physical    Patient: Ruben [name] DOB: Sep 30, 1945 DOA: 02/27/2022 DOS: the patient was seen and examined on 02/28/2022 PCP: [name], MD  Patient coming from:  [facility]  - lives with his wife. Uses walker and cane.    Chief Complaint: rectal pain   HPI: Ruben [name] is a 76 y.o. male with medical history significant of  ESRD MWF, chronic HFrEF, PAF, past history of EtOH and tobacco abuse, HTN, HLD, MR not a candidate for mitraclip, depression who presented to the ED yesterday evening with complaints of rectal pain x 1 week. He was seen by his PCP yesterday who sent him to [facility] for CT and labs and concern for a fistula.  He has history of an anal fissure and hemorrhoids and is followed by GI, Dr. Benson Norway. Feels similar. Started to have a pain 1 week ago. Pain not worse with BM. No blood. Also has had N/V with food and poor PO intake. Denies any fever/chills. No abdominal pain.   Recently admitted 01/31/22 for GI bleed and received 2 units PRBC and underwent EGD which revealed Barrett's esophagus and single non bleeding angiodysplastic lesion treated with a probe and clip.     Denies any fever/chills, vision changes/headaches, chest pain or palpitations, shortness of breath or cough, abdominal pain,or leg swelling.   He does not smoke or drink alcohol. Quit drinking 04/2021   ER Course:  afebrile, bp: 94/58, HR: 99, RR: 16, oxygen: 93%RA Pertinent labs: hgb: 10.4, BUN: 34, creatinine: 6.57 AST: 231, ALT: 126, lactic acid: 2.1>1.9 [place] RUQ: cholelithiasis in a contracted gallbaldder. Ascites and fatty infiltration of the liver.  CT abdomen: No perirectal abscess is identified but there is diffuse wall thickening of the low rectum just above the anus. This could represent proctitis or rectal mass. Direct visualization is suggested. 2. Hepatic cirrhosis. 3. Cholelithiasis with multiple stones filling the gallbladder. 4. Small bilateral pleural  effusions. Diffuse abdominal and pelvic ascites. Diffuse soft tissue edema. 5. Aortic atherosclerosis with significant focal stenosis demonstrated in the right external iliac artery. 6. Cardiac enlargement with probable edema in the lung bases. In ED: general surgery consulted. Given zosyn/vanc and augmentin. BC obtained. TRH asked for ER to ER transfer and for TRH to admit.   Review of Systems: As mentioned in the history of present illness. All other systems reviewed and are negative. Past Medical History:  Diagnosis Date   Arthritis    Chronic kidney disease, stage 3b ([place])    ESRD (end stage renal disease) ([place])    ETOH abuse    History of stress test 09/02/2008   showed inferolateral scar without ischemia   Hx of echocardiogram 09/02/2008   was essentially normal   Hyperlipidemia    Hypertension    Myocardial infarction [facility])    Tobacco abuse    Past Surgical History:  Procedure Laterality Date   AV FISTULA PLACEMENT Left 05/27/2021   Procedure: LEFT ARTERIOVENOUS (AV) GRAFT CREATION;  Surgeon: Broadus John, MD;  Location: [place];  Service: Vascular;  Laterality: Left;  PERIPHERAL NERVE BLOCK   BACK SURGERY     Dr Luiz Ochoa involving L3-L4 discectomy.   BIOPSY  06/14/2021   Procedure: BIOPSY;  Surgeon: Carol Ada, MD;  Location: [facility] ENDOSCOPY;  Service: Endoscopy;;   CARDIOVERSION N/A 05/13/2021   Procedure: CARDIOVERSION;  Surgeon: Larey Dresser, MD;  Location: [facility] ENDOSCOPY;  Service: Cardiovascular;  Laterality: N/A;   ESOPHAGOGASTRODUODENOSCOPY (EGD) WITH PROPOFOL N/A 02/04/2022  Procedure: ESOPHAGOGASTRODUODENOSCOPY (EGD) WITH PROPOFOL;  Surgeon: Carol Ada, MD;  Location: [place];  Service: Gastroenterology;  Laterality: N/A;   FLEXIBLE SIGMOIDOSCOPY N/A 06/14/2021   Procedure: FLEXIBLE SIGMOIDOSCOPY;  Surgeon: Carol Ada, MD;  Location: [place];  Service: Endoscopy;  Laterality: N/A;   HEMOSTASIS CLIP PLACEMENT  02/04/2022   Procedure: HEMOSTASIS CLIP  PLACEMENT;  Surgeon: Carol Ada, MD;  Location: [place];  Service: Gastroenterology;;   HERNIA REPAIR     HOT HEMOSTASIS N/A 02/04/2022   Procedure: HOT HEMOSTASIS (ARGON PLASMA COAGULATION/BICAP);  Surgeon: Carol Ada, MD;  Location: [place];  Service: Gastroenterology;  Laterality: N/A;   IR FLUORO GUIDE CV LINE RIGHT  05/21/2021   IR US GUIDE VASC ACCESS RIGHT  05/21/2021   RIGHT/LEFT HEART CATH AND CORONARY ANGIOGRAPHY N/A 05/09/2021   Procedure: RIGHT/LEFT HEART CATH AND CORONARY ANGIOGRAPHY;  Surgeon: Larey Dresser, MD;  Location: [place] CV [facility];  Service: Cardiovascular;  Laterality: N/A;   SPINE SURGERY     TEE WITHOUT CARDIOVERSION N/A 05/07/2021   Procedure: TRANSESOPHAGEAL ECHOCARDIOGRAM (TEE);  Surgeon: Larey Dresser, MD;  Location: [facility] ENDOSCOPY;  Service: Cardiovascular;  Laterality: N/A;   TEE WITHOUT CARDIOVERSION N/A 05/13/2021   Procedure: TRANSESOPHAGEAL ECHOCARDIOGRAM (TEE);  Surgeon: Larey Dresser, MD;  Location: [facility] ENDOSCOPY;  Service: Cardiovascular;  Laterality: N/A;   TEE WITHOUT CARDIOVERSION N/A 10/31/2021   Procedure: TRANSESOPHAGEAL ECHOCARDIOGRAM (TEE);  Surgeon: Larey Dresser, MD;  Location: [facility] ENDOSCOPY;  Service: Cardiovascular;  Laterality: N/A;   Social History:  reports that he quit smoking about 10 months ago. His smoking use included cigarettes. He has a 30.00 pack-year smoking history. He has never used smokeless tobacco. He reports that he does not drink alcohol and does not use drugs.  Allergies  Allergen Reactions   Other Other (See Comments)    Pollen - sneezing, itching/watery eyes    Family History  Problem Relation Age of Onset   Heart attack Brother    Diabetes Brother    Heart disease Brother    Cancer Father    Diabetes Brother    Heart disease Brother     Prior to Admission medications   Medication Sig Start Date End Date Taking? Authorizing Provider  amoxicillin-clavulanate (AUGMENTIN) 875-125 MG tablet Take 1  tablet by mouth every 12 (twelve) hours. 02/28/22  Yes Palumbo, April, MD  dicyclomine (BENTYL) 20 MG tablet Take 1 tablet (20 mg total) by mouth 2 (two) times daily. 02/28/22  Yes Palumbo, April, MD  acetaminophen (TYLENOL) 650 MG CR tablet Take 1,300 mg by mouth every 8 (eight) hours as needed for pain.    [provider]  albuterol (PROVENTIL) (2.5 MG/3ML) 0.083% nebulizer solution Take 3 mLs (2.5 mg total) by nebulization every 4 (four) hours as needed for wheezing or shortness of breath. 04/03/21 04/03/22  [name], FNP  amiodarone (PACERONE) 200 MG tablet Take 1 tablet (200 mg total) by mouth daily. 11/11/21   Larey Dresser, MD  apixaban (ELIQUIS) 5 MG TABS tablet Take 1 tablet (5 mg total) by mouth 2 (two) times daily. 08/26/21   [place], Maricela Bo, FNP  atorvastatin (LIPITOR) 80 MG tablet Take 1 tablet (80 mg total) by mouth daily. Patient taking differently: Take 80 mg by mouth at bedtime. 11/21/21   Joette Catching, PA-C  cholecalciferol (VITAMIN D3) 25 MCG (1000 UNIT) tablet Take 1,000 Units by mouth in the morning.    [provider]  ethyl chloride spray Apply 1 Application topically  as needed (prior to dialysis).    [provider]  gabapentin (NEURONTIN) 100 MG capsule Take 100 mg by mouth at bedtime. 11/14/21   [provider]  hydrocortisone (ANUSOL-HC) 25 MG suppository Place 1 suppository (25 mg total) rectally 2 (two) times daily. Patient taking differently: Place 25 mg rectally 2 (two) times daily as needed for hemorrhoids. 11/29/21   Carlisle Cater, PA-C  hydrOXYzine (ATARAX) 25 MG tablet Take 25 mg by mouth daily. 02/24/22   [provider]  loratadine (CLARITIN) 10 MG tablet Take 10 mg by mouth every other day.    [provider]  LORazepam (ATIVAN) 0.5 MG tablet Take 0.5 mg by mouth as needed. 02/12/22   [provider]  magnesium oxide (MAG-OX) 400 (240 Mg) MG tablet Take 400 mg by mouth daily in the  afternoon. 07/05/21   [provider]  melatonin 5 MG TABS Take 5-10 mg by mouth at bedtime as needed (sleep).    [provider]  metoCLOPramide (REGLAN) 5 MG tablet Take 1 tablet (5 mg total) by mouth every 8 (eight) hours as needed for nausea. 07/12/21   Couture, Cortni S, PA-C  midodrine (PROAMATINE) 10 MG tablet Take 1.5 tablets (15 mg total) by mouth 3 (three) times daily with meals. 11/21/21   Joette Catching, PA-C  nitroGLYCERIN (NITROSTAT) 0.4 MG SL tablet Place 1 tablet (0.4 mg total) under the tongue every 5 (five) minutes as needed for chest pain. 05/28/21 05/28/22  Clegg, Amy D, NP  NONFORMULARY OR COMPOUNDED ITEM Place 1 Application rectally 2 (two) times daily as needed for hemorrhoids. Diltiazem 2% Ointment 11/29/21   [provider]  ondansetron (ZOFRAN) 4 MG tablet Take 1 tablet (4 mg total) by mouth daily as needed for nausea or vomiting. 06/04/21 06/04/22  [name], FNP  pantoprazole (PROTONIX) 40 MG tablet Take 1 tablet (40 mg total) by mouth 2 (two) times daily. 02/05/22   Dessa Phi, DO  phenylephrine (,USE FOR PREPARATION-H,) 0.25 % suppository Place 1 suppository rectally 2 (two) times daily as needed for hemorrhoids.    [provider]  polyethylene glycol powder (MIRALAX) 17 GM/SCOOP powder Take 17 g by mouth daily. Make be given every other day as well    [provider]  prochlorperazine (COMPAZINE) 5 MG tablet Take 5 mg by mouth 3 (three) times daily as needed for nausea or vomiting. 09/05/21   [provider]  Propylene Glycol (SYSTANE COMPLETE) 0.6 % SOLN Place 1 drop into both eyes 3 (three) times daily as needed (dry/irritated eyes.).    [provider]  sertraline (ZOLOFT) 25 MG tablet Take 25 mg by mouth in the morning. 10/23/21   [provider]    Physical Exam: Vitals:   02/28/22 1130 02/28/22 1251 02/28/22 1626 02/28/22 1700  BP: 99/69  95/61 (!) 102/59  Pulse: 86  89 94  Resp: '15  16  18  '$ Temp:   98 F (36.7 C) 97.8 F (36.6 C)  TempSrc:    Oral  SpO2: 93% 100% 94% 100%  Weight:      Height:       General:  Appears calm and comfortable and is in NAD Eyes:  PERRL, EOMI, normal lids, iris ENT:  grossly normal hearing, lips & tongue, mmm; appropriate dentition Neck:  no LAD, masses or thyromegaly; no carotid bruits Cardiovascular:  RRR, no m/r/g. No LE edema.  Respiratory:   CTA bilaterally with no wheezes/rales/rhonchi.  Normal respiratory effort. Abdomen:  soft, mildly distended. No TTP, BS+. Negative murphy sign  GU: could not examine due to hall bed with no privacy  Back:   normal alignment, no CVAT Skin:  no rash or induration seen on limited exam. Fistula in LUE  Musculoskeletal:  grossly normal tone BUE/BLE, good ROM, no bony abnormality Lower extremity:  No LE edema.  Limited foot exam with no ulcerations.  2+ distal pulses. Psychiatric:  grossly normal mood and affect, speech fluent and appropriate, AOx3 Neurologic:  CN 2-12 grossly intact, moves all extremities in coordinated fashion, sensation intact   Radiological Exams on Admission: Independently reviewed - see discussion in A/P where applicable  US Abdomen Limited RUQ (LIVER/GB)  Result Date: 02/28/2022 CLINICAL DATA:  History of cholelithiasis with right upper quadrant pain, initial encounter EXAM: ULTRASOUND ABDOMEN LIMITED RIGHT UPPER QUADRANT COMPARISON:  CT from the previous day. FINDINGS: Gallbladder: Gallbladder is decompressed with wall echo shadow sign consistent with cholelithiasis. This is similar to that seen on recent CT examination. Wall is at the upper limits of normal. No pericholecystic fluid is seen. Negative sonographic Murphy's sign is noted. Common bile duct: Diameter: 4.3 mm. Liver: Increased in echogenicity consistent with fatty liver. Portal vein is patent on color Doppler imaging with normal direction of blood flow towards the liver. Other: Mild ascites is seen. IMPRESSION:  Cholelithiasis in a contracted gallbladder Ascites and fatty infiltration of the liver. Electronically Signed   By: Inez Catalina M.D.   On: 02/28/2022 01:14   CT ABDOMEN PELVIS W CONTRAST  Result Date: 02/27/2022 CLINICAL DATA:  Abdominal pain, acute nonlocalized. Concern for rectal abscess. EXAM: CT ABDOMEN AND PELVIS WITH CONTRAST TECHNIQUE: Multidetector CT imaging of the abdomen and pelvis was performed using the standard protocol following bolus administration of intravenous contrast. RADIATION DOSE REDUCTION: This exam was performed according to the departmental dose-optimization program which includes automated exposure control, adjustment of the mA and/or kV according to patient size and/or use of iterative reconstruction technique. CONTRAST:  150m OMNIPAQUE IOHEXOL 300 MG/ML  SOLN COMPARISON:  11/29/2021 FINDINGS: Lower chest: Small bilateral pleural effusions with basilar atelectasis, greater on the left. Hazy interstitial pattern to the lung bases likely representing edema. Cardiac enlargement. Coronary artery calcifications. Hepatobiliary: Subcentimeter low-attenuation lesions in the liver are too small to characterize but likely represent small cysts or hemangiomas. No change since prior study. No imaging follow-up is indicated. Probable hepatic cirrhosis with enlarged lateral segment of the left lobe of the liver. Cholelithiasis with multiple stones filling the gallbladder. Gallbladder is contracted. No bile duct dilatation. Pancreas: Unremarkable. No pancreatic ductal dilatation or surrounding inflammatory changes. Spleen: Normal in size without focal abnormality. Adrenals/Urinary Tract: No adrenal gland nodules. Renal nephrograms are delayed consistent with history of renal insufficiency. Multiple bilateral renal cysts, largest on the right measuring 2.2 cm diameter. No imaging follow-up is indicated. No hydronephrosis or hydroureter. Bladder is normal. Stomach/Bowel: Stomach, small bowel, and  colon are not abnormally distended. Mild scattered hyperdense material likely representing ingested material. Diverticulosis of the sigmoid colon without evidence of acute diverticulitis. Appendix is normal. The anorectal wall appears diffusely thickened without pouching of the lumen over the thickened segment. This could represent proctitis or rectal mass. Direct visualization is suggested. Vascular/Lymphatic: Diffuse aortic calcification. Scattered mural thrombus. No aneurysm or dissection. Focal stenosis of the right external iliac artery likely representing about 70% diameter reduction. No significant lymphadenopathy. Reproductive: Prostate gland is unremarkable. Other: Moderate diffuse abdominal and pelvic free fluid, likely ascites. Edema throughout the subcutaneous soft  tissues. No loculated collections. No free air. Musculoskeletal: Degenerative changes. No destructive bone lesions. Mild diffuse bone sclerosis may represent renal osteodystrophy. Curvilinear sclerosis in the right femoral head suggesting avascular necrosis. IMPRESSION: 1. No perirectal abscess is identified but there is diffuse wall thickening of the low rectum just above the anus. This could represent proctitis or rectal mass. Direct visualization is suggested. 2. Hepatic cirrhosis. 3. Cholelithiasis with multiple stones filling the gallbladder. 4. Small bilateral pleural effusions. Diffuse abdominal and pelvic ascites. Diffuse soft tissue edema. 5. Aortic atherosclerosis with significant focal stenosis demonstrated in the right external iliac artery. 6. Cardiac enlargement with probable edema in the lung bases. Electronically Signed   By: Lucienne Capers M.D.   On: 02/27/2022 22:33    EKG: pending    Labs on Admission: I have personally reviewed the available labs and imaging studies at the time of the admission.  Pertinent labs:   hgb: 10.4,  BUN: 34,  creatinine: 6.57  AST: 231,  ALT: 126,  lactic acid: 2.1>1.9  Assessment  and Plan: Principal Problem:   Prostatitis Active Problems:   Transaminitis   Cholelithiasis   ESRD on hemodialysis (HCC)   Paroxysmal atrial fibrillation (HCC)   Chronic systolic CHF (congestive heart failure) (HCC)   CAD (coronary artery disease)   Mitral regurgitation   Anxiety and Depression   HLD (hyperlipidemia)   Normocytic anemia    Assessment and Plan: * Prostatitis 76 year old presenting with 1 week history of rectal pain.  -obs to telemetry  -Gi consulted and feels like non infectious prostatitis, no mass felt on his exam. No antibiotics at this time  -starting enemas and will follow   Transaminitis Hx of transminitis in February of 2023 and underwent MRI  liver due to elevated LFTs which showed 12 mm benign lesions.  -has chronic CHF and severe mitral regurg that could be contributing -on amiodarone, will need to watch and trend enzymes  -hold statin since 3X upper limit of normal  -history of alcohol use and quit 12/22. Now Ct shows cirrhosis with ascites on imaging. No abdominal pain or concern for SBP -check hep panel/INR -calculate MELD when INR results  -new ascites.. GI consulted. Due to hypotension oral treatment may be limited    Cholelithiasis Elevated liver enzymes and bilirutin in setting of cirrhosis. Negative Murphy sign but has stones on imaging and a contracted gall bladder No other systemic signs of infection. Do not feel that abx warranted at this time.  MRCP ordered, general surgery consulted   ESRD on hemodialysis [facility]) HD on MWF, last full session on Wednesday Not signs of volume overload or electrolyte derangement Nephrology consulted for HD, appreciate. Likely tomorrow   Paroxysmal atrial fibrillation (HCC) Rate controlled Continue amiodarone, follow LFTs closely Continue eliquis   Chronic systolic CHF (congestive heart failure) ([place]) TEE 06/23: EF 25-30%, RV mildly reduced, severe ischemic MR No signs of volume overload, does not  make urine Volume control per DM  CAD (coronary artery disease) LHC (1/23): Occluded RCA with collaterals, serial 50%/80%/80% mid-distal LCx stenoses, serial 60%/70%/50% proximal to mid LAD stenoses, 70% ostial D1. No good PCI targets and not a CABG candidate.   Mitral regurgitation Severe ischemic MR on 6/23 TEE.  Not candidate for Mitraclip per structural heat team due to poor functional capacity and challenging imaging.   Anxiety and Depression Continue zoloft   HLD (hyperlipidemia) Hold statin since LFT >3x normal limit   Normocytic anemia Likely some ACD from CKD  Stable Continue to follow     Advance Care Planning:   Code Status: Full Code   Consults: general surgery/GI: Dr. Hung/nephrology   DVT Prophylaxis: eliquis  Family Communication: daughter at bedside   Severity of Illness: The appropriate patient status for this patient is INPATIENT. Inpatient status is judged to be reasonable and necessary in order to provide the required intensity of service to ensure the patient's safety. The patient's presenting symptoms, physical exam findings, and initial radiographic and laboratory data in the context of their chronic comorbidities is felt to place them at high risk for further clinical deterioration. Furthermore, it is not anticipated that the patient will be medically stable for discharge from the hospital within 2 midnights of admission.   * I certify that at the point of admission it is my clinical judgment that the patient will require inpatient hospital care spanning beyond 2 midnights from the point of admission due to high intensity of service, high risk for further deterioration and high frequency of surveillance required.*  Author: Orma Flaming, MD 02/28/2022 6:23 PM  For on call review [email or web address].  '

## 2022-02-28 NOTE — Assessment & Plan Note (Signed)
-

## 2022-02-28 NOTE — Assessment & Plan Note (Signed)
Likely some ACD from CKD Stable Continue to follow

## 2022-02-28 NOTE — ED Notes (Signed)
Patient refused chicken broth wanted water

## 2022-02-28 NOTE — Assessment & Plan Note (Signed)
Patient with non infectious proctitis. He was treated with mesalamine enemas during his hospitalization per GI recommendations with improvement in his symptoms.  Patient will follow up as outpatient with GI.

## 2022-02-28 NOTE — Plan of Care (Signed)

## 2022-02-28 NOTE — Assessment & Plan Note (Signed)
Hold statin since LFT >3x normal limit

## 2022-02-28 NOTE — ED Notes (Signed)
Patient came in via [facility]

## 2022-02-28 NOTE — Consult Note (Signed)
[employer] [birth year]  [id number].    Requesting MD: Dr. Orma Flaming Chief Complaint/Reason for Consult:   HPI: Ruben [name] is a 76 y.o. male with hx of ESRD on HD (MWF), HTN, HLD, CAD, prior tobacco abuse (quit 12/22), Etoh abuse (quit 12/22) with Cirrhosis, PAF s/p DCCV in 1/23 on Eliquis, OSA and CHFrEF (EF 25-30% - 10/31/21) on midodrine with severe mitral regurg (not candidate for Mitraclip per cards note 9/26) who presented to the ED for rectal pain for 3 days.  Was seen by his PCP on 10/26 who sent him into [facility] for CT and labs to further evaluate.  He previously has seen GI, Dr. Benson Norway for anal fissure and hemorrhoids in the past but does not have a history of perianal abscess or fistula.  No prior colonoscopy.  He denies any fever, chills, abdominal pain, nausea, vomiting, change in appetite or change in bowel habits.  No drainage from the area or obvious skin changes that the patient was able to report.   Underwent work-up in the ED was found to be afebrile.  Borderline tachycardic at 99 with soft BP of 94/58 on initial presentation.  That is since resolved and HR and BP have improved.  WBC 7.2.  Lactic 2.1 initially, cleared at 1.9 on recheck.  CR 6.57.  LFTs elevated with T. Bili 3.0 (baseline 1.5 - 1.8 since 11/21/21 on chart review), AST 231, ALT 126, Alk Phos wnl at 115. No lipase checked. CT A/P with no perirectal abscess is identified but there is diffuse wall thickening of the low rectum just above the anus; hepatic cirrhosis with diffuse abdominal and pelvic ascites; cholelithiasis with multiple stones filling the gallbladder.  RUQ [place] with cholelithiasis, gallbladder wall upper limits normal, no pericholecystic fluid, negative Murphy sign, contracted GB, CBD 4.3 mm.  Dr. Benson Norway of GI has already been consulted.  TRH to admit.  We were consulted given elevated LFTs and cholelithiasis and asked to comment.   Hx prior hernia repair. Former smoker and heavy alcohol  use. States he quit about one year ago. States before his admission for NSTEMI in December of last year he was smoking 1 ppd cigarettes. 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 He told me while he was working he used to only drink on the weekends, drank a decent amount. After he lost his job he started drinking more, about a pint of wine per day for 2 months. He lives at home with his wife and daughter, his daughter I at bedside.  ROS: ROS As above, see hpi  Family History  Problem Relation Age of Onset   Heart attack Brother    Diabetes Brother    Heart disease Brother    Cancer Father    Diabetes Brother    Heart disease Brother     Past Medical History:  Diagnosis Date   Arthritis    Chronic kidney disease, stage 3b ([place])    ESRD (end stage renal disease) ([place])    ETOH abuse    History of stress test 09/02/2008   showed inferolateral scar without ischemia   Hx of echocardiogram 09/02/2008   was essentially normal   Hyperlipidemia    Hypertension    Myocardial infarction [facility])    Tobacco abuse     Past Surgical History:  Procedure Laterality Date   AV FISTULA PLACEMENT Left 05/27/2021   Procedure: LEFT ARTERIOVENOUS (AV) GRAFT  CREATION;  Surgeon: Broadus John, MD;  Location: [place];  Service: Vascular;  Laterality: Left;  PERIPHERAL NERVE BLOCK   BACK SURGERY     Dr Luiz Ochoa involving L3-L4 discectomy.   BIOPSY  06/14/2021   Procedure: BIOPSY;  Surgeon: Carol Ada, MD;  Location: [facility] ENDOSCOPY;  Service: Endoscopy;;   CARDIOVERSION N/A 05/13/2021   Procedure: CARDIOVERSION;  Surgeon: Larey Dresser, MD;  Location: [facility] ENDOSCOPY;  Service: Cardiovascular;  Laterality: N/A;   ESOPHAGOGASTRODUODENOSCOPY (EGD) WITH PROPOFOL N/A 02/04/2022   Procedure: ESOPHAGOGASTRODUODENOSCOPY (EGD) WITH PROPOFOL;  Surgeon: Carol Ada, MD;  Location: [place];  Service: Gastroenterology;  Laterality: N/A;   FLEXIBLE SIGMOIDOSCOPY N/A 06/14/2021    Procedure: FLEXIBLE SIGMOIDOSCOPY;  Surgeon: Carol Ada, MD;  Location: [place];  Service: Endoscopy;  Laterality: N/A;   HEMOSTASIS CLIP PLACEMENT  02/04/2022   Procedure: HEMOSTASIS CLIP PLACEMENT;  Surgeon: Carol Ada, MD;  Location: [place];  Service: Gastroenterology;;   HERNIA REPAIR     HOT HEMOSTASIS N/A 02/04/2022   Procedure: HOT HEMOSTASIS (ARGON PLASMA COAGULATION/BICAP);  Surgeon: Carol Ada, MD;  Location: [place];  Service: Gastroenterology;  Laterality: N/A;   IR FLUORO GUIDE CV LINE RIGHT  05/21/2021   IR US GUIDE VASC ACCESS RIGHT  05/21/2021   RIGHT/LEFT HEART CATH AND CORONARY ANGIOGRAPHY N/A 05/09/2021   Procedure: RIGHT/LEFT HEART CATH AND CORONARY ANGIOGRAPHY;  Surgeon: Larey Dresser, MD;  Location: [place] [facility];  Service: Cardiovascular;  Laterality: N/A;   SPINE SURGERY     TEE WITHOUT CARDIOVERSION N/A 05/07/2021   Procedure: TRANSESOPHAGEAL ECHOCARDIOGRAM (TEE);  Surgeon: Larey Dresser, MD;  Location: [facility] ENDOSCOPY;  Service: Cardiovascular;  Laterality: N/A;   TEE WITHOUT CARDIOVERSION N/A 05/13/2021   Procedure: TRANSESOPHAGEAL ECHOCARDIOGRAM (TEE);  Surgeon: Larey Dresser, MD;  Location: [facility] ENDOSCOPY;  Service: Cardiovascular;  Laterality: N/A;   TEE WITHOUT CARDIOVERSION N/A 10/31/2021   Procedure: TRANSESOPHAGEAL ECHOCARDIOGRAM (TEE);  Surgeon: Larey Dresser, MD;  Location: [facility] ENDOSCOPY;  Service: Cardiovascular;  Laterality: N/A;    Social History:  reports that he quit smoking about 10 months ago. His smoking use included cigarettes. He has a 30.00 pack-year smoking history. He has never used smokeless tobacco. He reports that he does not drink alcohol and does not use drugs.  Allergies:  Allergies  Allergen Reactions   Other Other (See Comments)    Pollen - sneezing, itching/watery eyes    (Not in a hospital admission)    Physical Exam: Blood pressure 99/69, pulse 86, temperature (!) 97.3 F (36.3 C), temperature  source Oral, resp. rate 15, height _0  (1.778 m), weight 70.3 kg, SpO2 100 %. General: pleasant, chronically ill appearing male who is laying in bed in NAD HEENT: head is normocephalic, atraumatic.  Sclera are noninjected.  Pupils equal.   Heart: regular, rate, and rhythm.  No lower extremity edema Lungs: normal effort on nasal cannula Abd:  Soft, protuberant, NT/ND, +BS. No masses, hernias, or organomegaly GU: exam deferred- patient is in the ED hallway. He has already undergone DRE by Dr. Benson Norway today. Skin: warm and dry  Psych: A&Ox4 with an appropriate affect Neuro: nonfocal exam, appropriate speech, gait not assessed   Results for orders placed or performed during the hospital encounter of 02/27/22 (from the past 48 hour(s))  CBC with Differential     Status: Abnormal   Collection Time: 02/27/22  8:47 PM  Result Value Ref Range   WBC 7.2 4.0 - 10.5 K/uL   RBC  3.30 (L) 4.22 - 5.81 MIL/uL   Hemoglobin 10.4 (L) 13.0 - 17.0 g/dL   HCT 30.5 (L) 39.0 - 52.0 %   MCV 92.4 80.0 - 100.0 fL   MCH 31.5 26.0 - 34.0 pg   MCHC 34.1 30.0 - 36.0 g/dL   RDW 22.6 (H) 11.5 - 15.5 %   Platelets 126 (L) 150 - 400 K/uL   nRBC 0.4 (H) 0.0 - 0.2 %   Neutrophils Relative % 83 %   Neutro Abs 6.0 1.7 - 7.7 K/uL   Lymphocytes Relative 10 %   Lymphs Abs 0.7 0.7 - 4.0 K/uL   Monocytes Relative 6 %   Monocytes Absolute 0.4 0.1 - 1.0 K/uL   Eosinophils Relative 0 %   Eosinophils Absolute 0.0 0.0 - 0.5 K/uL   Basophils Relative 0 %   Basophils Absolute 0.0 0.0 - 0.1 K/uL   WBC Morphology MORPHOLOGY UNREMARKABLE    RBC Morphology See Note    Smear Review Normal platelet morphology    Immature Granulocytes 1 %   Abs Immature Granulocytes 0.04 0.00 - 0.07 K/uL   Acanthocytes PRESENT    Tear Drop Cells PRESENT    Burr Cells PRESENT    Polychromasia PRESENT    Target Cells PRESENT     Comment: Performed at [facility], [address] [place], [place] [address]  Comprehensive metabolic  panel     Status: Abnormal   Collection Time: 02/27/22  8:47 PM  Result Value Ref Range   Sodium 139 135 - 145 mmol/L   Potassium 3.9 3.5 - 5.1 mmol/L   Chloride 94 (L) 98 - 111 mmol/L   CO2 31 22 - 32 mmol/L   Glucose, Bld 99 70 - 99 mg/dL    Comment: Glucose reference range applies only to samples taken after fasting for at least 8 hours.   BUN 34 (H) 8 - 23 mg/dL   Creatinine, Ser 6.57 (H) 0.61 - 1.24 mg/dL   Calcium 9.7 8.9 - 10.3 mg/dL   Total Protein 6.9 6.5 - 8.1 g/dL   Albumin 3.7 3.5 - 5.0 g/dL   AST 231 (H) 15 - 41 U/L   ALT 126 (H) 0 - 44 U/L   Alkaline Phosphatase 115 38 - 126 U/L   Total Bilirubin 3.0 (H) 0.3 - 1.2 mg/dL   GFR, Estimated 8 (L) >60 mL/min    Comment: (NOTE) Calculated using the CKD-EPI Creatinine Equation (2021)    Anion gap 14 5 - 15    Comment: Performed at [facility], [place], [place] [address]  Lactic acid, plasma     Status: Abnormal   Collection Time: 02/27/22  8:47 PM  Result Value Ref Range   Lactic Acid, Venous 2.1 (HH) 0.5 - 1.9 mmol/L    Comment: CRITICAL RESULT CALLED TO, READ BACK BY AND VERIFIED WITH: A DILLARD 02/27/22 AT 2327 HS Performed at [facility], [address] [place], [place] [address]   Lactic acid, plasma     Status: None   Collection Time: 02/28/22  3:00 AM  Result Value Ref Range   Lactic Acid, Venous 1.9 0.5 - 1.9 mmol/L    Comment: Performed at [facility], [address] [place], [place] [address]  CBG monitoring, ED     Status: None   Collection Time: 02/28/22  1:48 PM  Result Value Ref Range   Glucose-Capillary 87 70 - 99 mg/dL    Comment: Glucose reference  range applies only to samples taken after fasting for at least 8 hours.   US Abdomen Limited RUQ (LIVER/GB)  Result Date: 02/28/2022 CLINICAL DATA:  History of cholelithiasis with right upper quadrant pain, initial encounter EXAM: ULTRASOUND ABDOMEN LIMITED RIGHT UPPER  QUADRANT COMPARISON:  CT from the previous day. FINDINGS: Gallbladder: Gallbladder is decompressed with wall echo shadow sign consistent with cholelithiasis. This is similar to that seen on recent CT examination. Wall is at the upper limits of normal. No pericholecystic fluid is seen. Negative sonographic Murphy's sign is noted. Common bile duct: Diameter: 4.3 mm. Liver: Increased in echogenicity consistent with fatty liver. Portal vein is patent on color Doppler imaging with normal direction of blood flow towards the liver. Other: Mild ascites is seen. IMPRESSION: Cholelithiasis in a contracted gallbladder Ascites and fatty infiltration of the liver. Electronically Signed   By: Inez Catalina M.D.   On: 02/28/2022 01:14   CT ABDOMEN PELVIS W CONTRAST  Result Date: 02/27/2022 CLINICAL DATA:  Abdominal pain, acute nonlocalized. Concern for rectal abscess. EXAM: CT ABDOMEN AND PELVIS WITH CONTRAST TECHNIQUE: Multidetector CT imaging of the abdomen and pelvis was performed using the standard protocol following bolus administration of intravenous contrast. RADIATION DOSE REDUCTION: This exam was performed according to the departmental dose-optimization program which includes automated exposure control, adjustment of the mA and/or kV according to patient size and/or use of iterative reconstruction technique. CONTRAST:  129m OMNIPAQUE IOHEXOL 300 MG/ML  SOLN COMPARISON:  11/29/2021 FINDINGS: Lower chest: Small bilateral pleural effusions with basilar atelectasis, greater on the left. Hazy interstitial pattern to the lung bases likely representing edema. Cardiac enlargement. Coronary artery calcifications. Hepatobiliary: Subcentimeter low-attenuation lesions in the liver are too small to characterize but likely represent small cysts or hemangiomas. No change since prior study. No imaging follow-up is indicated. Probable hepatic cirrhosis with enlarged lateral segment of the left lobe of the liver. Cholelithiasis with  multiple stones filling the gallbladder. Gallbladder is contracted. No bile duct dilatation. Pancreas: Unremarkable. No pancreatic ductal dilatation or surrounding inflammatory changes. Spleen: Normal in size without focal abnormality. Adrenals/Urinary Tract: No adrenal gland nodules. Renal nephrograms are delayed consistent with history of renal insufficiency. Multiple bilateral renal cysts, largest on the right measuring 2.2 cm diameter. No imaging follow-up is indicated. No hydronephrosis or hydroureter. Bladder is normal. Stomach/Bowel: Stomach, small bowel, and colon are not abnormally distended. Mild scattered hyperdense material likely representing ingested material. Diverticulosis of the sigmoid colon without evidence of acute diverticulitis. Appendix is normal. The anorectal wall appears diffusely thickened without pouching of the lumen over the thickened segment. This could represent proctitis or rectal mass. Direct visualization is suggested. Vascular/Lymphatic: Diffuse aortic calcification. Scattered mural thrombus. No aneurysm or dissection. Focal stenosis of the right external iliac artery likely representing about 70% diameter reduction. No significant lymphadenopathy. Reproductive: Prostate gland is unremarkable. Other: Moderate diffuse abdominal and pelvic free fluid, likely ascites. Edema throughout the subcutaneous soft tissues. No loculated collections. No free air. Musculoskeletal: Degenerative changes. No destructive bone lesions. Mild diffuse bone sclerosis may represent renal osteodystrophy. Curvilinear sclerosis in the right femoral head suggesting avascular necrosis. IMPRESSION: 1. No perirectal abscess is identified but there is diffuse wall thickening of the low rectum just above the anus. This could represent proctitis or rectal mass. Direct visualization is suggested. 2. Hepatic cirrhosis. 3. Cholelithiasis with multiple stones filling the gallbladder. 4. Small bilateral pleural  effusions. Diffuse abdominal and pelvic ascites. Diffuse soft tissue edema. 5. Aortic atherosclerosis with significant focal stenosis demonstrated  in the right external iliac artery. 6. Cardiac enlargement with probable edema in the lung bases. Electronically Signed   By: Lucienne Capers M.D.   On: 02/27/2022 22:33    Anti-infectives (From admission, onward)    Start     Dose/Rate Route Frequency Ordered Stop   02/28/22 0300  vancomycin (VANCOCIN) IVPB 1000 mg/200 mL premix        1,000 mg 200 mL/hr over 60 Minutes Intravenous  Once 02/28/22 0251 02/28/22 0448   02/28/22 0300  piperacillin-tazobactam (ZOSYN) IVPB 3.375 g        3.375 g 100 mL/hr over 30 Minutes Intravenous  Once 02/28/22 0251 02/28/22 0341   02/28/22 0000  amoxicillin-clavulanate (AUGMENTIN) 875-125 MG tablet        1 tablet Oral Every 12 hours 02/28/22 0022     02/27/22 2330  amoxicillin-clavulanate (AUGMENTIN) 875-125 MG per tablet 1 tablet        1 tablet Oral  Once 02/27/22 2318 02/28/22 0106       Assessment/Plan Cholelithiasis  Elevated LFT's Patient has been seen and examined.  Vitals, labs, imaging, I/O, and available notes have been reviewed.  Patient presented with rectal pain.  He denies any fever, abdominal pain, nausea or vomiting prior to or since presentation.  He denies post-prandial pain or vomiting. He is completely nontender on exam.  He has a normal WBC and his lactic is cleared.  He does have elevated T. Bili at 3.0 w/ AST 231, ALT 126. Alk Phos wnl at 115. Will add on a lipase.  While patient does have cholelithiasis his imaging is not very suggestive of cholecystitis, with gallbladder wall being upper limit normal on [place] but no pericholecystic fluid even in the setting of ascites and negative Murphy sign.  I think it is reasonable to check a lipase to start, fractionate T. bili to see if this is mainly direct or indirect (baseline T. Bili 1.5 - 1.8 since 11/21/21 on chart review) and obtain MRCP to rule  out Choledocholithiasis.  Continue to trend LFTs daily.  If MRCP was suggestive of cholecystitis, I would recommend abx +/- Perc Chole by IR (although may be difficult given GB contracted on [place]). HIDA would likely not be helpful given patient's underlying hepatic dysfunction with cirrhosis. I do not think patient is a good surgical candidate for Laparoscopic Cholecystectomy given his multiple medical problems including ESRD, CAD, PAF, OSA and CHFrEF (EF 25-30% - 10/31/21) with severe mitral regurg that cardiology felt was not a candidate for Mitraclip. Patient is also with Cirrhosis and has a MELD of 31 which places him at a 52.6% 3 month mortality (this is based off of INR from 10/4 as one has not been checked during this admission, will recheck for more accurate calculation). I do not think he needs any abx from our standpoint at this time, would defer to GI who has already been consulted. We will follow.   Rectal Pain - No obvious perirectal abscess on CT. Patient was in the hallway without a curtain so rectal exam was deferred. Based on history of BMs and no abscess on CT, I see no surgical indications at present.  FEN - NPO, IVF per [facility] and TRH VTE - SCDs, hold Eliquis, okay for heparin GTT if needed ID - no antibiotics indicated by our team at this time Dispo - Admit to [place].   ESRD on HD (MWF) HTN HLD CAD Prior tobacco abuse (quit 12/22) Etoh abuse (quit 12/22) with Cirrhosis PAF  s/p DCCV in 1/23 on Eliquis OSA  CHFrEF (EF 25-30% - 10/31/21) on midodrine with severe mitral regurg (not candidate for Mitraclip per cards note 9/26) - CT w/ cardiac enlargement with probable edema in the lung bases. Aortic atherosclerosis with significant focal stenosis demonstrated in the right external iliac artery on CT  I reviewed nursing notes, ED provider notes, last 24 h vitals and pain scores, last 48 h intake and output, last 24 h labs and trends, and last 24 h imaging results  Obie Dredge,  [facility] 02/28/2022, 2:11 PM Please see Amion for pager number during day hours 7:00am-4:30pm

## 2022-02-28 NOTE — ED Notes (Signed)
Patient left with [facility]

## 2022-02-28 NOTE — Assessment & Plan Note (Addendum)
Positive gallstone but not cholecystitis Patient with no abdominal pain at the time of his discharge, with no nausea or vomiting.  Probably high surgical risk due to advanced heart failure.

## 2022-02-28 NOTE — Assessment & Plan Note (Addendum)
Patient with elevated liver enzymes, imaging positive for fatty liver and cirrhosis, Patient was paced on midodrine and albumin.  No paracentesis was required.   Possible component of congestive hepatopathy due to severe mitral regurgitation. Unfortunately not candidate for valve intervention. Amiodarone was discontinued to prevent hepatotoxicity.  Follow up liver enzymes as outpatient.

## 2022-02-28 NOTE — Consult Note (Signed)
[facility] Renal Consultation Note  Requesting MD: Orma Flaming, MD Indication for Consultation:  ESRD  Chief complaint: rectal pain  HPI:  Ruben [name] is a 76 y.o. male with a history including ESRD, HTN, CAD, tobacco abuse, and prior alcohol abuse who presented to the hospital with one week of rectal pain.  Note previously admitted in late September for GI bleed.  Note he quit drinking in 04/2021 per charting.  Nephrology is consulted for assistance with management of ESRD.  Spoke with his daughter at bedside; she supplements his history.  We discussed nephrology basics including dry weight.     PMHx:   Past Medical History:  Diagnosis Date   Arthritis    Chronic kidney disease, stage 3b ([place])    ESRD (end stage renal disease) ([place])    ETOH abuse    History of stress test 09/02/2008   showed inferolateral scar without ischemia   Hx of echocardiogram 09/02/2008   was essentially normal   Hyperlipidemia    Hypertension    Myocardial infarction [facility])    Tobacco abuse     Past Surgical History:  Procedure Laterality Date   AV FISTULA PLACEMENT Left 05/27/2021   Procedure: LEFT ARTERIOVENOUS (AV) GRAFT CREATION;  Surgeon: Broadus John, MD;  Location: [place];  Service: Vascular;  Laterality: Left;  PERIPHERAL NERVE BLOCK   BACK SURGERY     Dr Luiz Ochoa involving L3-L4 discectomy.   BIOPSY  06/14/2021   Procedure: BIOPSY;  Surgeon: Carol Ada, MD;  Location: [facility] ENDOSCOPY;  Service: Endoscopy;;   CARDIOVERSION N/A 05/13/2021   Procedure: CARDIOVERSION;  Surgeon: Larey Dresser, MD;  Location: [facility] ENDOSCOPY;  Service: Cardiovascular;  Laterality: N/A;   ESOPHAGOGASTRODUODENOSCOPY (EGD) WITH PROPOFOL N/A 02/04/2022   Procedure: ESOPHAGOGASTRODUODENOSCOPY (EGD) WITH PROPOFOL;  Surgeon: Carol Ada, MD;  Location: [place];  Service: Gastroenterology;  Laterality: N/A;   FLEXIBLE SIGMOIDOSCOPY N/A 06/14/2021   Procedure: FLEXIBLE SIGMOIDOSCOPY;   Surgeon: Carol Ada, MD;  Location: [place];  Service: Endoscopy;  Laterality: N/A;   HEMOSTASIS CLIP PLACEMENT  02/04/2022   Procedure: HEMOSTASIS CLIP PLACEMENT;  Surgeon: Carol Ada, MD;  Location: [place];  Service: Gastroenterology;;   HERNIA REPAIR     HOT HEMOSTASIS N/A 02/04/2022   Procedure: HOT HEMOSTASIS (ARGON PLASMA COAGULATION/BICAP);  Surgeon: Carol Ada, MD;  Location: [place];  Service: Gastroenterology;  Laterality: N/A;   IR FLUORO GUIDE CV LINE RIGHT  05/21/2021   IR US GUIDE VASC ACCESS RIGHT  05/21/2021   RIGHT/LEFT HEART CATH AND CORONARY ANGIOGRAPHY N/A 05/09/2021   Procedure: RIGHT/LEFT HEART CATH AND CORONARY ANGIOGRAPHY;  Surgeon: Larey Dresser, MD;  Location: [place] CV LAB;  Service: Cardiovascular;  Laterality: N/A;   SPINE SURGERY     TEE WITHOUT CARDIOVERSION N/A 05/07/2021   Procedure: TRANSESOPHAGEAL ECHOCARDIOGRAM (TEE);  Surgeon: Larey Dresser, MD;  Location: [facility] ENDOSCOPY;  Service: Cardiovascular;  Laterality: N/A;   TEE WITHOUT CARDIOVERSION N/A 05/13/2021   Procedure: TRANSESOPHAGEAL ECHOCARDIOGRAM (TEE);  Surgeon: Larey Dresser, MD;  Location: [facility] ENDOSCOPY;  Service: Cardiovascular;  Laterality: N/A;   TEE WITHOUT CARDIOVERSION N/A 10/31/2021   Procedure: TRANSESOPHAGEAL ECHOCARDIOGRAM (TEE);  Surgeon: Larey Dresser, MD;  Location: [facility] ENDOSCOPY;  Service: Cardiovascular;  Laterality: N/A;    Family Hx:  Family History  Problem Relation Age of Onset   Heart attack Brother    Diabetes Brother    Heart disease Brother    Cancer Father    Diabetes Brother  Heart disease Brother     Social History:  reports that he quit smoking about 10 months ago. His smoking use included cigarettes. He has a 30.00 pack-year smoking history. He has never used smokeless tobacco. He reports that he does not drink alcohol and does not use drugs.  Allergies:  Allergies  Allergen Reactions   Other Other (See Comments)    Pollen -  sneezing, itching/watery eyes    Medications: Prior to Admission medications   Medication Sig Start Date End Date Taking? Authorizing Provider  acetaminophen (TYLENOL) 650 MG CR tablet Take 1,300 mg by mouth every 8 (eight) hours as needed for pain.   Yes [provider]  amiodarone (PACERONE) 200 MG tablet Take 1 tablet (200 mg total) by mouth daily. 11/11/21  Yes Larey Dresser, MD  amoxicillin-clavulanate (AUGMENTIN) 875-125 MG tablet Take 1 tablet by mouth every 12 (twelve) hours. 02/28/22  Yes Palumbo, April, MD  apixaban (ELIQUIS) 5 MG TABS tablet Take 1 tablet (5 mg total) by mouth 2 (two) times daily. 08/26/21  Yes [place], Maricela Bo, FNP  atorvastatin (LIPITOR) 80 MG tablet Take 1 tablet (80 mg total) by mouth daily. Patient taking differently: Take 80 mg by mouth at bedtime. 11/21/21  Yes Joette Catching, PA-C  cholecalciferol (VITAMIN D3) 25 MCG (1000 UNIT) tablet Take 1,000 Units by mouth in the morning.   Yes [provider]  dicyclomine (BENTYL) 20 MG tablet Take 1 tablet (20 mg total) by mouth 2 (two) times daily. 02/28/22  Yes Palumbo, April, MD  ethyl chloride spray Apply 1 Application topically as needed (prior to dialysis).   Yes [provider]  gabapentin (NEURONTIN) 100 MG capsule Take 100 mg by mouth at bedtime. 11/14/21  Yes [provider]  hydrocortisone (ANUSOL-HC) 25 MG suppository Place 1 suppository (25 mg total) rectally 2 (two) times daily. Patient taking differently: Place 25 mg rectally 2 (two) times daily as needed for hemorrhoids. 11/29/21  Yes Carlisle Cater, PA-C  hydrOXYzine (ATARAX) 25 MG tablet Take 25 mg by mouth daily. 02/24/22  Yes [provider]  loratadine (CLARITIN) 10 MG tablet Take 10 mg by mouth every other day.   Yes [provider]  LORazepam (ATIVAN) 0.5 MG tablet Take 0.5 mg by mouth as needed for anxiety. 02/12/22  Yes [provider]  magnesium oxide (MAG-OX) 400 (240 Mg) MG  tablet Take 400 mg by mouth daily in the afternoon. 07/05/21  Yes [provider]  melatonin 5 MG TABS Take 5-10 mg by mouth at bedtime as needed (sleep).   Yes [provider]  metoCLOPramide (REGLAN) 5 MG tablet Take 1 tablet (5 mg total) by mouth every 8 (eight) hours as needed for nausea. 07/12/21  Yes Couture, Cortni S, PA-C  midodrine (PROAMATINE) 10 MG tablet Take 1.5 tablets (15 mg total) by mouth 3 (three) times daily with meals. 11/21/21  Yes Joette Catching, PA-C  nitroGLYCERIN (NITROSTAT) 0.4 MG SL tablet Place 1 tablet (0.4 mg total) under the tongue every 5 (five) minutes as needed for chest pain. 05/28/21 05/28/22 Yes Clegg, Amy D, NP  NONFORMULARY OR COMPOUNDED ITEM Place 1 Application rectally 2 (two) times daily as needed for hemorrhoids. Diltiazem 2% Ointment 11/29/21  Yes [provider]  ondansetron (ZOFRAN) 4 MG tablet Take 1 tablet (4 mg total) by mouth daily as needed for nausea or vomiting. 06/04/21 06/04/22 Yes [name], FNP  pantoprazole (PROTONIX) 40 MG tablet Take 1 tablet (40 mg total) by  mouth 2 (two) times daily. 02/05/22  Yes Dessa Phi, DO  phenylephrine (,USE FOR PREPARATION-H,) 0.25 % suppository Place 1 suppository rectally 2 (two) times daily as needed for hemorrhoids.   Yes [provider]  polyethylene glycol powder (MIRALAX) 17 GM/SCOOP powder Take 17 g by mouth daily. Make be given every other day as well   Yes [provider]  prochlorperazine (COMPAZINE) 5 MG tablet Take 5 mg by mouth 3 (three) times daily as needed for nausea or vomiting. 09/05/21  Yes [provider]  sertraline (ZOLOFT) 25 MG tablet Take 25 mg by mouth in the morning. 10/23/21  Yes [provider]    I have reviewed the patient's current and reported prior to admission medications.  Labs:     Latest Ref Rng & Units 02/27/2022    8:47 PM 02/05/2022   12:28 AM 02/04/2022   12:24 AM  BMP  Glucose 70 - 99 mg/dL 99  100  88    BUN 8 - 23 mg/dL 34  28  22   Creatinine 0.61 - 1.24 mg/dL 6.57  5.54  4.16   Sodium 135 - 145 mmol/L 139  135  136   Potassium 3.5 - 5.1 mmol/L 3.9  4.2  3.5   Chloride 98 - 111 mmol/L 94  95  95   CO2 22 - 32 mmol/L '31  27  29   '$ Calcium 8.9 - 10.3 mg/dL 9.7  9.1  8.8     ROS:  Pertinent items noted in HPI and remainder of comprehensive ROS otherwise negative.  Physical Exam: Vitals:   02/28/22 1700 02/28/22 2104  BP: (!) 102/59 103/68  Pulse: 94 90  Resp: 18   Temp: 97.8 F (36.6 C) 97.7 F (36.5 C)  SpO2: 100%      General: elderly male in bed in NAD at rest  HEENT: NCAT Eyes: EOMI sclera anicteric Neck: supple trachea midline  Heart: S1S2 no rub Lungs: clear and unlabored; room air  Abdomen: soft/nt/nd Extremities: no edema lower extremities; no cyanosis or clubbing  Skin: no rash on extremities exposed Neuro: awake and conversant; provides basic hx and follows commands Psych no anxiety or agitation  Access: left AVG bruit and thrill   Outpatient HD orders:  MWF [facility]  3 hrs and 45 minutes BF 400/ DF 500 2K/2 Ca EDW 70 kg (Last tx 10/25 - 69.3 kg) - sometimes has hypotension with HD AV graft Meds:  Mircera 30 mcg every 2 weeks - last given 10/23  Assessment/Plan:  Rectal pain  - felt 2/2 non-infectious prostatitis per charting - per primary team and GI  ESRD - HD per MWF usually  - Plan for HD tomorrow off schedule 2/2 staffing and volumes - no urgent need for HD today   HTN  - controlled   Anemia CKD - Hb acceptable at 10.4; ESA recently administered  Metabolic bone disease  - not on activated vit D   Supratherapeutic INR - noted at 5.2 - per primary team  - floor is going to get him socks with grips to reduce risk of fall   Disposition - HD here tomorrow then dispo per primary team   Claudia Desanctis 02/28/2022, 10:53 PM

## 2022-02-28 NOTE — Assessment & Plan Note (Addendum)
TEE 06/23: EF 25-30%, RV mildly reduced, severe ischemic MR No signs of volume overload, does not make urine Volume control per DM

## 2022-02-28 NOTE — ED Notes (Signed)
Rogers Blocker MD at bedside

## 2022-02-28 NOTE — Assessment & Plan Note (Addendum)
Continue renal replacement therapy including ultrafiltration for volume management.

## 2022-02-28 NOTE — ED Notes (Signed)
Provider states that the patient can have limited clear fluids

## 2022-02-28 NOTE — Consult Note (Signed)
Reason for Consult: Proctitis and abnormal liver enzymes Referring Physician: [facility]  Rachel Junior Tino HPI: This is a 76 year old male with a PMH of proctitis 06/2021, cholelithiasis, CHF (EF 25-30%), severe mitral regurg, cirrhosis, history of ETOH abuse, and PAF on Eliquis admitted with complaints of rectal pain.  The patient was seen in the office Monday this week for routine follow up.  He was well and his strength was improving as he was ambulating more.  Prior to that time he admitted that he was in bed or in some recumbent position for 90% of the day.  Since that visit he started to complain about proctalgia.  He was evaluated by his PCP and the rectal examination suggested a rectal mass and he was referred to the ER for further evaluation.  The work up in the ER was negative for any rectal mass, but there was evidence of rectal wall thickening.  The CT scan also showed the possibility of cirrhosis, which was not present with the prior CT scans earlier this year.  However, there was evidence of steatosis.  In February 2023 he was admitted for the same presentation as this admission.  Work up with an [place] was negative for any mass and it was positive for a mild proctitis and a posterior anal fissure.  He was treated conservatively and his symptoms resolved with time.  His liver enzymes also improved and the elevation was felt to be from passive congestion.  At that time he displayed hepatojugular reflux.   Past Medical History:  Diagnosis Date   Arthritis    Chronic kidney disease, stage 3b ([place])    ESRD (end stage renal disease) ([place])    ETOH abuse    History of stress test 09/02/2008   showed inferolateral scar without ischemia   Hx of echocardiogram 09/02/2008   was essentially normal   Hyperlipidemia    Hypertension    Myocardial infarction [facility])    Tobacco abuse     Past Surgical History:  Procedure Laterality Date   AV FISTULA PLACEMENT Left 05/27/2021   Procedure:  LEFT ARTERIOVENOUS (AV) GRAFT CREATION;  Surgeon: Broadus John, MD;  Location: [place];  Service: Vascular;  Laterality: Left;  PERIPHERAL NERVE BLOCK   BACK SURGERY     Dr Luiz Ochoa involving L3-L4 discectomy.   BIOPSY  06/14/2021   Procedure: BIOPSY;  Surgeon: Carol Ada, MD;  Location: [facility] ENDOSCOPY;  Service: Endoscopy;;   CARDIOVERSION N/A 05/13/2021   Procedure: CARDIOVERSION;  Surgeon: Larey Dresser, MD;  Location: [facility] ENDOSCOPY;  Service: Cardiovascular;  Laterality: N/A;   ESOPHAGOGASTRODUODENOSCOPY (EGD) WITH PROPOFOL N/A 02/04/2022   Procedure: ESOPHAGOGASTRODUODENOSCOPY (EGD) WITH PROPOFOL;  Surgeon: Carol Ada, MD;  Location: [place];  Service: Gastroenterology;  Laterality: N/A;   FLEXIBLE SIGMOIDOSCOPY N/A 06/14/2021   Procedure: FLEXIBLE SIGMOIDOSCOPY;  Surgeon: Carol Ada, MD;  Location: [place];  Service: Endoscopy;  Laterality: N/A;   HEMOSTASIS CLIP PLACEMENT  02/04/2022   Procedure: HEMOSTASIS CLIP PLACEMENT;  Surgeon: Carol Ada, MD;  Location: [place];  Service: Gastroenterology;;   HERNIA REPAIR     HOT HEMOSTASIS N/A 02/04/2022   Procedure: HOT HEMOSTASIS (ARGON PLASMA COAGULATION/BICAP);  Surgeon: Carol Ada, MD;  Location: [place];  Service: Gastroenterology;  Laterality: N/A;   IR FLUORO GUIDE CV LINE RIGHT  05/21/2021   IR US GUIDE VASC ACCESS RIGHT  05/21/2021   RIGHT/LEFT HEART CATH AND CORONARY ANGIOGRAPHY N/A 05/09/2021   Procedure: RIGHT/LEFT HEART CATH AND CORONARY ANGIOGRAPHY;  Surgeon: Larey Dresser, MD;  Location: [place] CV LAB;  Service: Cardiovascular;  Laterality: N/A;   SPINE SURGERY     TEE WITHOUT CARDIOVERSION N/A 05/07/2021   Procedure: TRANSESOPHAGEAL ECHOCARDIOGRAM (TEE);  Surgeon: Larey Dresser, MD;  Location: [facility] ENDOSCOPY;  Service: Cardiovascular;  Laterality: N/A;   TEE WITHOUT CARDIOVERSION N/A 05/13/2021   Procedure: TRANSESOPHAGEAL ECHOCARDIOGRAM (TEE);  Surgeon: Larey Dresser, MD;  Location: [facility]  ENDOSCOPY;  Service: Cardiovascular;  Laterality: N/A;   TEE WITHOUT CARDIOVERSION N/A 10/31/2021   Procedure: TRANSESOPHAGEAL ECHOCARDIOGRAM (TEE);  Surgeon: Larey Dresser, MD;  Location: [facility] ENDOSCOPY;  Service: Cardiovascular;  Laterality: N/A;    Family History  Problem Relation Age of Onset   Heart attack Brother    Diabetes Brother    Heart disease Brother    Cancer Father    Diabetes Brother    Heart disease Brother     Social History:  reports that he quit smoking about 10 months ago. His smoking use included cigarettes. He has a 30.00 pack-year smoking history. He has never used smokeless tobacco. He reports that he does not drink alcohol and does not use drugs.  Allergies:  Allergies  Allergen Reactions   Other Other (See Comments)    Pollen - sneezing, itching/watery eyes    Medications: Scheduled:  amiodarone  200 mg Oral Daily   apixaban  5 mg Oral BID   atorvastatin  80 mg Oral QHS   gabapentin  100 mg Oral QHS   midodrine  15 mg Oral TID WC   pantoprazole  40 mg Oral BID   [START ON 03/01/2022] sertraline  25 mg Oral q AM   sodium chloride flush  3 mL Intravenous Q12H   Continuous:  sodium chloride      Results for orders placed or performed during the hospital encounter of 02/27/22 (from the past 24 hour(s))  CBC with Differential     Status: Abnormal   Collection Time: 02/27/22  8:47 PM  Result Value Ref Range   WBC 7.2 4.0 - 10.5 K/uL   RBC 3.30 (L) 4.22 - 5.81 MIL/uL   Hemoglobin 10.4 (L) 13.0 - 17.0 g/dL   HCT 30.5 (L) 39.0 - 52.0 %   MCV 92.4 80.0 - 100.0 fL   MCH 31.5 26.0 - 34.0 pg   MCHC 34.1 30.0 - 36.0 g/dL   RDW 22.6 (H) 11.5 - 15.5 %   Platelets 126 (L) 150 - 400 K/uL   nRBC 0.4 (H) 0.0 - 0.2 %   Neutrophils Relative % 83 %   Neutro Abs 6.0 1.7 - 7.7 K/uL   Lymphocytes Relative 10 %   Lymphs Abs 0.7 0.7 - 4.0 K/uL   Monocytes Relative 6 %   Monocytes Absolute 0.4 0.1 - 1.0 K/uL   Eosinophils Relative 0 %   Eosinophils Absolute  0.0 0.0 - 0.5 K/uL   Basophils Relative 0 %   Basophils Absolute 0.0 0.0 - 0.1 K/uL   WBC Morphology MORPHOLOGY UNREMARKABLE    RBC Morphology See Note    Smear Review Normal platelet morphology    Immature Granulocytes 1 %   Abs Immature Granulocytes 0.04 0.00 - 0.07 K/uL   Acanthocytes PRESENT    Tear Drop Cells PRESENT    Burr Cells PRESENT    Polychromasia PRESENT    Target Cells PRESENT   Comprehensive metabolic panel     Status: Abnormal   Collection Time: 02/27/22  8:47 PM  Result Value Ref  Range   Sodium 139 135 - 145 mmol/L   Potassium 3.9 3.5 - 5.1 mmol/L   Chloride 94 (L) 98 - 111 mmol/L   CO2 31 22 - 32 mmol/L   Glucose, Bld 99 70 - 99 mg/dL   BUN 34 (H) 8 - 23 mg/dL   Creatinine, Ser 6.57 (H) 0.61 - 1.24 mg/dL   Calcium 9.7 8.9 - 10.3 mg/dL   Total Protein 6.9 6.5 - 8.1 g/dL   Albumin 3.7 3.5 - 5.0 g/dL   AST 231 (H) 15 - 41 U/L   ALT 126 (H) 0 - 44 U/L   Alkaline Phosphatase 115 38 - 126 U/L   Total Bilirubin 3.0 (H) 0.3 - 1.2 mg/dL   GFR, Estimated 8 (L) >60 mL/min   Anion gap 14 5 - 15  Lactic acid, plasma     Status: Abnormal   Collection Time: 02/27/22  8:47 PM  Result Value Ref Range   Lactic Acid, Venous 2.1 (HH) 0.5 - 1.9 mmol/L  Lactic acid, plasma     Status: None   Collection Time: 02/28/22  3:00 AM  Result Value Ref Range   Lactic Acid, Venous 1.9 0.5 - 1.9 mmol/L  CBG monitoring, ED     Status: None   Collection Time: 02/28/22  1:48 PM  Result Value Ref Range   Glucose-Capillary 87 70 - 99 mg/dL     US Abdomen Limited RUQ (LIVER/GB)  Result Date: 02/28/2022 CLINICAL DATA:  History of cholelithiasis with right upper quadrant pain, initial encounter EXAM: ULTRASOUND ABDOMEN LIMITED RIGHT UPPER QUADRANT COMPARISON:  CT from the previous day. FINDINGS: Gallbladder: Gallbladder is decompressed with wall echo shadow sign consistent with cholelithiasis. This is similar to that seen on recent CT examination. Wall is at the upper limits of normal. No  pericholecystic fluid is seen. Negative sonographic Murphy's sign is noted. Common bile duct: Diameter: 4.3 mm. Liver: Increased in echogenicity consistent with fatty liver. Portal vein is patent on color Doppler imaging with normal direction of blood flow towards the liver. Other: Mild ascites is seen. IMPRESSION: Cholelithiasis in a contracted gallbladder Ascites and fatty infiltration of the liver. Electronically Signed   By: Inez Catalina M.D.   On: 02/28/2022 01:14   CT ABDOMEN PELVIS W CONTRAST  Result Date: 02/27/2022 CLINICAL DATA:  Abdominal pain, acute nonlocalized. Concern for rectal abscess. EXAM: CT ABDOMEN AND PELVIS WITH CONTRAST TECHNIQUE: Multidetector CT imaging of the abdomen and pelvis was performed using the standard protocol following bolus administration of intravenous contrast. RADIATION DOSE REDUCTION: This exam was performed according to the departmental dose-optimization program which includes automated exposure control, adjustment of the mA and/or kV according to patient size and/or use of iterative reconstruction technique. CONTRAST:  148m OMNIPAQUE IOHEXOL 300 MG/ML  SOLN COMPARISON:  11/29/2021 FINDINGS: Lower chest: Small bilateral pleural effusions with basilar atelectasis, greater on the left. Hazy interstitial pattern to the lung bases likely representing edema. Cardiac enlargement. Coronary artery calcifications. Hepatobiliary: Subcentimeter low-attenuation lesions in the liver are too small to characterize but likely represent small cysts or hemangiomas. No change since prior study. No imaging follow-up is indicated. Probable hepatic cirrhosis with enlarged lateral segment of the left lobe of the liver. Cholelithiasis with multiple stones filling the gallbladder. Gallbladder is contracted. No bile duct dilatation. Pancreas: Unremarkable. No pancreatic ductal dilatation or surrounding inflammatory changes. Spleen: Normal in size without focal abnormality. Adrenals/Urinary  Tract: No adrenal gland nodules. Renal nephrograms are delayed consistent with history of renal insufficiency.  Multiple bilateral renal cysts, largest on the right measuring 2.2 cm diameter. No imaging follow-up is indicated. No hydronephrosis or hydroureter. Bladder is normal. Stomach/Bowel: Stomach, small bowel, and colon are not abnormally distended. Mild scattered hyperdense material likely representing ingested material. Diverticulosis of the sigmoid colon without evidence of acute diverticulitis. Appendix is normal. The anorectal wall appears diffusely thickened without pouching of the lumen over the thickened segment. This could represent proctitis or rectal mass. Direct visualization is suggested. Vascular/Lymphatic: Diffuse aortic calcification. Scattered mural thrombus. No aneurysm or dissection. Focal stenosis of the right external iliac artery likely representing about 70% diameter reduction. No significant lymphadenopathy. Reproductive: Prostate gland is unremarkable. Other: Moderate diffuse abdominal and pelvic free fluid, likely ascites. Edema throughout the subcutaneous soft tissues. No loculated collections. No free air. Musculoskeletal: Degenerative changes. No destructive bone lesions. Mild diffuse bone sclerosis may represent renal osteodystrophy. Curvilinear sclerosis in the right femoral head suggesting avascular necrosis. IMPRESSION: 1. No perirectal abscess is identified but there is diffuse wall thickening of the low rectum just above the anus. This could represent proctitis or rectal mass. Direct visualization is suggested. 2. Hepatic cirrhosis. 3. Cholelithiasis with multiple stones filling the gallbladder. 4. Small bilateral pleural effusions. Diffuse abdominal and pelvic ascites. Diffuse soft tissue edema. 5. Aortic atherosclerosis with significant focal stenosis demonstrated in the right external iliac artery. 6. Cardiac enlargement with probable edema in the lung bases. Electronically  Signed   By: Lucienne Capers M.D.   On: 02/27/2022 22:33    ROS:  As stated above in the HPI otherwise negative.  Blood pressure 95/61, pulse 89, temperature 98 F (36.7 C), resp. rate 16, height '5\' 10"'$  (1.778 m), weight 70.3 kg, SpO2 94 %.    PE: Gen: NAD, Alert and Oriented HEENT:  [place]/AT, EOMI, + mild hepatojugular reflux Neck: Supple, no LAD Lungs: CTA Bilaterally CV: RRR without M/G/R ABD: Soft, NTND, +BS Ext: No C/C/E Rectal: No masses palpated  Assessment/Plan: 1)  Proctitis/proctalgia. 2) History of a posterior anal fissure. 3) Elevated liver enzymes felt to be secondary form CHF. 4) Cirrhosis - combination of CHF and prior ETOH. 5) ESRD. 6) Cholelithiasis.   The patient has the same presentation as his admission in February this year.  Conservative treatment resolved his GI issues.  During this examination a posterior anal fissure was not palpated and the rectal examination did not induce any worsened pain.  There is a history of constipation and the rectal wall thickening may be from constipation.  [facility] [place] in February did show a focal erosions, grossly suggesting a solitary rectal ulcer.  Plan: 1) Trail of a mesalamine enema. 2) Monitor liver enzymes. 3) Optimize cardiac status. 4) [place] [facility] - Dr. Carlean Purl covering this weekend.  Diannie Willner D 02/28/2022, 4:54 PM

## 2022-02-28 NOTE — Assessment & Plan Note (Signed)
LHC (1/23): Occluded RCA with collaterals, serial 50%/80%/80% mid-distal LCx stenoses, serial 60%/70%/50% proximal to mid LAD stenoses, 70% ostial D1. No good PCI targets and not a CABG candidate.   Resume statin as outpatient when liver enzymes more stable.

## 2022-03-01 DIAGNOSIS — R7401 Elevation of levels of liver transaminase levels: Secondary | ICD-10-CM | POA: Diagnosis not present

## 2022-03-01 DIAGNOSIS — K7460 Unspecified cirrhosis of liver: Secondary | ICD-10-CM | POA: Diagnosis not present

## 2022-03-01 DIAGNOSIS — F064 Anxiety disorder due to known physiological condition: Secondary | ICD-10-CM | POA: Diagnosis not present

## 2022-03-01 DIAGNOSIS — K8020 Calculus of gallbladder without cholecystitis without obstruction: Secondary | ICD-10-CM

## 2022-03-01 DIAGNOSIS — H7093 Unspecified mastoiditis, bilateral: Secondary | ICD-10-CM | POA: Diagnosis not present

## 2022-03-01 DIAGNOSIS — K6289 Other specified diseases of anus and rectum: Secondary | ICD-10-CM | POA: Diagnosis not present

## 2022-03-01 DIAGNOSIS — R188 Other ascites: Secondary | ICD-10-CM | POA: Insufficient documentation

## 2022-03-01 DIAGNOSIS — N410 Acute prostatitis: Secondary | ICD-10-CM | POA: Diagnosis not present

## 2022-03-01 LAB — COMPREHENSIVE METABOLIC PANEL
ALT: 160 U/L — ABNORMAL HIGH (ref 0–44)
AST: 293 U/L — ABNORMAL HIGH (ref 15–41)
Albumin: 2.8 g/dL — ABNORMAL LOW (ref 3.5–5.0)
Alkaline Phosphatase: 111 U/L (ref 38–126)
Anion gap: 16 — ABNORMAL HIGH (ref 5–15)
BUN: 41 mg/dL — ABNORMAL HIGH (ref 8–23)
CO2: 28 mmol/L (ref 22–32)
Calcium: 9.7 mg/dL (ref 8.9–10.3)
Chloride: 92 mmol/L — ABNORMAL LOW (ref 98–111)
Creatinine, Ser: 8.72 mg/dL — ABNORMAL HIGH (ref 0.61–1.24)
GFR, Estimated: 6 mL/min — ABNORMAL LOW (ref >60)
Glucose, Bld: 114 mg/dL — ABNORMAL HIGH (ref 70–99)
Potassium: 4.9 mmol/L (ref 3.5–5.1)
Sodium: 136 mmol/L (ref 135–145)
Total Bilirubin: 3.1 mg/dL — ABNORMAL HIGH (ref 0.3–1.2)
Total Protein: 6.2 g/dL — ABNORMAL LOW (ref 6.5–8.1)

## 2022-03-01 LAB — CBC
HCT: 31.1 % — ABNORMAL LOW (ref 39.0–52.0)
Hemoglobin: 10.1 g/dL — ABNORMAL LOW (ref 13.0–17.0)
MCH: 31.3 pg (ref 26.0–34.0)
MCHC: 32.5 g/dL (ref 30.0–36.0)
MCV: 96.3 fL (ref 80.0–100.0)
Platelets: 97 10*3/uL — ABNORMAL LOW (ref 150–400)
RBC: 3.23 MIL/uL — ABNORMAL LOW (ref 4.22–5.81)
RDW: 22.9 % — ABNORMAL HIGH (ref 11.5–15.5)
WBC: 6.2 10*3/uL (ref 4.0–10.5)
nRBC: 0.8 % — ABNORMAL HIGH (ref 0.0–0.2)

## 2022-03-01 LAB — PROTIME-INR
INR: 4.6 (ref 0.8–1.2)
Prothrombin Time: 43 seconds — ABNORMAL HIGH (ref 11.4–15.2)

## 2022-03-01 LAB — HEPATITIS B SURFACE ANTIGEN: Hepatitis B Surface Ag: NONREACTIVE

## 2022-03-01 MED ORDER — METOPROLOL TARTRATE 5 MG/5ML IV SOLN: 5.0000 mg | INTRAVENOUS | Status: DC | PRN

## 2022-03-01 MED ORDER — GUAIFENESIN 100 MG/5ML PO LIQD: 5.0000 mL | ORAL | Status: DC | PRN

## 2022-03-01 MED ORDER — IPRATROPIUM-ALBUTEROL 0.5-2.5 (3) MG/3ML IN SOLN: 3.0000 mL | RESPIRATORY_TRACT | Status: DC | PRN

## 2022-03-01 MED ORDER — HYDRALAZINE HCL 20 MG/ML IJ SOLN: 10.0000 mg | INTRAMUSCULAR | Status: DC | PRN

## 2022-03-01 MED ORDER — TRAZODONE HCL 50 MG PO TABS
50.0000 mg | ORAL_TABLET | Freq: Every evening | ORAL | Status: DC | PRN
  Administered 2022-03-02 – 2022-03-05 (×3): 50 mg via ORAL
  Filled 2022-03-01 (×4): qty 1

## 2022-03-01 MED ORDER — SENNOSIDES-DOCUSATE SODIUM 8.6-50 MG PO TABS
1.0000 | ORAL_TABLET | Freq: Every evening | ORAL | Status: DC | PRN
  Administered 2022-03-05: 1 via ORAL
  Filled 2022-03-01: qty 1

## 2022-03-01 NOTE — Evaluation (Signed)
Physical Therapy Evaluation Patient Details Name: Ruben [name] MRN: [id number] DOB: 10/20/1945 Today's Date: 03/01/2022  History of Present Illness  76 y/o male presented to ED on 02/27/22 for hemorrhoid check to rule out rectal abscess. CT showed rectal wall thickening and cirrhosis. PMH: proctitis, CHF, severe mitral regurgitation, cirrhosis, hx of ETOH abuse, PAF, hx of MI  Clinical Impression  Patient admitted with the above. PTA, patient lives with wife and reports needing assistance for ADLs and mobility from daughter and wife. Daughter lives within 2-3 minutes from patient. Reports 2 falls occurring when he was picking up something off floor both times. Patient presents with weakness, impaired balance, and decreased activity tolerance. Patient required min guard for sit to stand transfer and able to ambulate short hallway distance with close supervision and RW. Patient will benefit from skilled PT services during acute stay to address listed deficits. Recommend HHPT at discharge to maximize functional independence and safety in the home.        Recommendations for follow up therapy are one component of a multi-disciplinary discharge planning process, led by the attending physician.  Recommendations may be updated based on patient status, additional functional criteria and insurance authorization.  Follow Up Recommendations Home health PT      Assistance Recommended at Discharge Intermittent Supervision/Assistance  Patient can return home with the following  A little help with walking and/or transfers;A little help with bathing/dressing/bathroom;Assistance with cooking/housework;Help with stairs or ramp for entrance;Assist for transportation    Equipment Recommendations None recommended by PT  Recommendations for Other Services       Functional Status Assessment Patient has had a recent decline in their functional status and demonstrates the ability to make significant  improvements in function in a reasonable and predictable amount of time.     Precautions / Restrictions Precautions Precautions: Fall Restrictions Weight Bearing Restrictions: No      Mobility  Bed Mobility               General bed mobility comments: up in chair upon arrival    Transfers Overall transfer level: Needs assistance   Transfers: Sit to/from Stand Sit to Stand: Min guard           General transfer comment: min gaurd for safety    Ambulation/Gait Ambulation/Gait assistance: Supervision Gait Distance (Feet): 60 Feet Assistive device: Rolling Lofton Leon (2 wheels) Gait Pattern/deviations: Step-through pattern, Decreased stride length Gait velocity: decreased     General Gait Details: cues for upright posture. Close supervision for safety but no overt LOB. Patient tends to forward flex trunk due to comfort with rollator  Stairs            Wheelchair Mobility    Modified Rankin (Stroke Patients Only)       Balance Overall balance assessment: Needs assistance Sitting-balance support: Feet supported, No upper extremity supported Sitting balance-[name] Scale: Fair     Standing balance support: During functional activity, Reliant on assistive device for balance Standing balance-[name] Scale: Poor Standing balance comment: reliant on RW                             Pertinent Vitals/Pain Pain Assessment Pain Assessment: No/denies pain    Home Living Family/patient expects to be discharged to:: Private residence Living Arrangements: Spouse/significant other Available Help at Discharge: Family Type of Home: House Home Access: Stairs to enter Entrance Stairs-Rails: Right (uses cane to assist on opposite side) Entrance [employer]  of Steps: 2   Home Layout: One level Home Equipment: Conservation officer, nature (2 wheels);Rollator (4 wheels);Cane - single point;Toilet riser;Wheelchair - Sport and exercise psychologist Comments: Pts daughter  lives within 2 miles of the home    Prior Function Prior Level of Function : Needs assist             Mobility Comments: Pt typically ambulates with rollator. Reports 2 falls in last 6 months when reaching down to pick something up ADLs Comments: Wife and daughter assist with lower body ADLs and showering (when needed), daughter assists with IADL management     Hand Dominance   Dominant Hand: Right    Extremity/Trunk Assessment   Upper Extremity Assessment Upper Extremity Assessment: Defer to OT evaluation    Lower Extremity Assessment Lower Extremity Assessment: Generalized weakness    Cervical / Trunk Assessment Cervical / Trunk Assessment: Normal  Communication   Communication: No difficulties  Cognition Arousal/Alertness: Awake/alert Behavior During Therapy: WFL for tasks assessed/performed Overall Cognitive Status: Within Functional Limits for tasks assessed                                          General Comments      Exercises     Assessment/Plan    PT Assessment Patient needs continued PT services  PT Problem List Decreased strength;Decreased activity tolerance;Decreased balance;Decreased mobility;Cardiopulmonary status limiting activity       PT Treatment Interventions Gait training;DME instruction;Stair training;Functional mobility training;Therapeutic activities;Therapeutic exercise;Balance training;Patient/family education    PT Goals (Current goals can be found in the Care Plan section)  Acute Rehab PT Goals Patient Stated Goal: to get stronger PT Goal Formulation: With patient/family Time For Goal Achievement: 03/11/2022 Potential to Achieve Goals: Good    Frequency Min 3X/week     Co-evaluation               AM-PAC PT "6 Clicks" Mobility  Outcome Measure Help needed turning from your back to your side while in a flat bed without using bedrails?: A Little Help needed moving from lying on your back to sitting on  the side of a flat bed without using bedrails?: A Little Help needed moving to and from a bed to a chair (including a wheelchair)?: A Little Help needed standing up from a chair using your arms (e.g., wheelchair or bedside chair)?: A Little Help needed to walk in hospital room?: A Little Help needed climbing 3-5 steps with a railing? : A Little 6 Click Score: 18    End of Session   Activity Tolerance: Patient tolerated treatment well Patient left: in chair;with call bell/phone within reach;with family/visitor present Nurse Communication: Mobility status PT Visit Diagnosis: Unsteadiness on feet (R26.81);Muscle weakness (generalized) (M62.81);History of falling (Z91.81)    Time: 1342-1400 PT Time Calculation (min) (ACUTE ONLY): 18 min   Charges:   PT Evaluation $PT Eval Low Complexity: 1 Low          Lillah Standre A. Gilford Rile PT, DPT Acute Rehabilitation Services Office [phone]   Linna Hoff 03/01/2022, 3:59 PM

## 2022-03-01 NOTE — Progress Notes (Signed)
Critical lab reported from Lab INR 4.6. Paged provider and awaiting order.

## 2022-03-01 NOTE — TOC Initial Note (Signed)
Transition of Care [facility]) - Initial/Assessment Note    Patient Details  Name: Ruben [name] MRN: [id number] Date of Birth: 1945-06-08  [facility] CM/SW Contact:    Bartholomew Crews, RN Phone Number: [phone] 03/01/2022, 11:37 AM  Clinical Narrative:                  Spoke with patient and daughter, Ruben [name], att the bedside to discuss post acute transition. PTA home with spouse and daughter. Daughter provides transportation to dialysis and other medical appointments. Patient's chair time for dialysis is 10:30 am. If patient is to be discharged on dialysis day, daughter is agreeable to picking up patient early AM in order to transport to outpatient HD facility. For DME, patient has RW, rollator, and cane. Patient expressed no concerns about medication, however, daughter concerned about Miralax being on hold. Daughter also reports poor appetite and usually takes Ensure, but that has been on hold in the las few days. TOC following for transition needs.   Expected Discharge Plan: Home/Self Care Barriers to Discharge: Continued Medical Work up   Patient Goals and CMS Choice Patient states their goals for this hospitalization and ongoing recovery are:: home with family CMS Medicare.gov Compare Post Acute Care list provided to:: Patient Choice offered to / list presented to : NA  Expected Discharge Plan and Services Expected Discharge Plan: Home/Self Care   Discharge Planning Services: CM Consult Post Acute Care Choice: NA Living arrangements for the past 2 months: Single Family Home                 DME Arranged: N/A DME Agency: NA       HH Arranged: NA HH Agency: NA        Prior Living Arrangements/Services Living arrangements for the past 2 months: Single Family Home Lives with:: Self, Adult Children, Spouse Patient language and need for interpreter reviewed:: Yes Do you feel safe going back to the place where you live?: Yes      Need for Family  Participation in Patient Care: Yes (Comment) Care giver support system in place?: Yes (comment) Current home services: DME (RW, rollator, cane) Criminal Activity/Legal Involvement Pertinent to Current Situation/Hospitalization: No - Comment as needed  Activities of Daily Living      Permission Sought/Granted Permission sought to share information with : Family Supports    Share Information with NAME: Ruben [name]     Permission granted to share info w Relationship: daughter  Permission granted to share info w Contact Information: [phone]  Emotional Assessment Appearance:: Appears stated age Attitude/Demeanor/Rapport: Engaged Affect (typically observed): Accepting Orientation: : Oriented to Self, Oriented to Place, Oriented to  Time, Oriented to Situation Alcohol / Substance Use: Not Applicable Psych Involvement: No (comment)  Admission diagnosis:  Proctitis [K62.89] Prostatitis [N41.9] Rectal mass [K62.89] Abnormal liver function [R94.5] Cholelithiasis [K80.20] Abdominal pain, unspecified abdominal location [R10.9] Patient Active Problem List   Diagnosis Date Noted   Cholelithiasis 02/28/2022   Prostatitis 02/28/2022   Melena 02/05/2022   Barrett esophagus 02/05/2022   Anticoagulant long-term use 02/05/2022   Hyperkalemia 02/05/2022   Mitral regurgitation 02/02/2022   Acute GI bleeding 02/01/2022   Anxiety and Depression    Liver lesions, elevated LFTs 06/10/2021   Rectal pain and constipation 06/08/2021   Dysphagia 06/08/2021   Paroxysmal atrial fibrillation (HCC) 32/67/1245   Chronic systolic CHF (congestive heart failure) ([place]) 06/08/2021   Normocytic anemia 06/08/2021   Transaminitis 06/08/2021   CAD (coronary artery disease) 06/08/2021  ESRD on hemodialysis ([place]) 06/07/2021   Rectal bleeding 06/07/2021   Colon cancer screening 06/07/2021   Weight loss 06/07/2021   Atrial fibrillation with RVR ([place])    Protein-calorie malnutrition, severe 05/24/2021    Prediabetes 07/12/2018   Right upper quadrant abdominal pain 08/10/2017   Stomach in right sided position 06/09/2017   Right patella fracture 02/12/2016   HLD (hyperlipidemia) 02/21/2013   PCP:  [name], MD Pharmacy:   [facility] [place] [place] [address] Phone: [phone] Fax: [phone]     Social Determinants of Health (SDOH) Interventions    Readmission Risk Interventions     No data to display

## 2022-03-01 NOTE — Progress Notes (Signed)
Patient Name: Ruben [name] Date of Encounter: 03/01/2022, 3:06 PM    Subjective  Rectal pain less today No dyspnea changes   Objective  BP 100/64   Pulse 88   Temp 97.8 F (36.6 C) (Oral)   Resp 15   Ht '5\' 10"'$  (1.778 m)   Wt 70.3 kg   SpO2 100%   BMI 22.24 kg/m  Elderly bm NAD + JVD Ht s1s2  Abd obese, soft NT - could have ascites  Recent Labs  Lab 02/27/22 2047 02/28/22 1814 03/01/22 0203  AST 231*  --  293*  ALT 126*  --  160*  ALKPHOS 115  --  111  BILITOT 3.0* 3.2* 3.1*  PROT 6.9  --  6.2*  ALBUMIN 3.7  --  2.8*  INR  --  5.2* 4.6*      Latest Ref Rng & Units 03/01/2022    2:03 AM 02/27/2022    8:47 PM 02/05/2022   12:28 AM  CBC  WBC 4.0 - 10.5 K/uL 6.2  7.2  6.1   Hemoglobin 13.0 - 17.0 g/dL 10.1  10.4  9.8   Hematocrit 39.0 - 52.0 % 31.1  30.5  29.3   Platelets 150 - 400 K/uL 97  126  154     Recent Labs  Lab 02/27/22 2047 03/01/22 0203  NA 139 136  K 3.9 4.9  CL 94* 92*  CO2 31 28  GLUCOSE 99 114*  BUN 34* 41*  CREATININE 6.57* 8.72*  CALCIUM 9.7 9.7   MR ABDOMEN MRCP WO CONTRAST CLINICAL DATA:  Cholelithiasis  EXAM: MRI ABDOMEN WITHOUT CONTRAST  (INCLUDING MRCP)  TECHNIQUE: Multiplanar multisequence MR imaging of the abdomen was performed. Heavily T2-weighted images of the biliary and pancreatic ducts were obtained, and three-dimensional MRCP images were rendered by post processing.  COMPARISON:  Right upper quadrant ultrasound dated 02/28/2022. CT abdomen/pelvis dated 02/27/2022.  FINDINGS: Motion degraded images.  Lower chest: Small bilateral pleural effusions.  Hepatobiliary: Mildly nodular hepatic contour, suggesting cirrhosis. Moderate hepatic steatosis. Scattered probable hepatic cysts, measuring up to 14 mm in segment 4A (series 13/image 15).  Contracted gallbladder with cholelithiasis. No associated inflammatory changes to suggest acute cholecystitis.  No intrahepatic or extrahepatic ductal  dilatation. Common duct measures 4 mm. No choledocholithiasis is seen.  Pancreas:  Within normal limits.  Spleen:  Within normal limits.  Adrenals/Urinary Tract:  Adrenal glands are within normal limits.  Kidneys are notable for multiple simple bilateral renal cysts, measuring up to 2.0 cm in the right upper kidney (series 4/image 17), benign (Bosniak I). Additional bilateral hemorrhagic lesions, with intrinsic T1 hyperintensity and intermediate/dark on T2, measuring up to 13 mm in the medial interpolar right kidney (series 4/image 26) and 17 mm in the anterior interpolar left kidney (series 4/image 27), benign (Bosniak II). No follow-up is recommended.  No hydronephrosis.  Stomach/Bowel: Stomach is within normal limits.  Susceptibility artifact in the second portion of the duodenum, related to the patient's hemostasis clip from recent EGD.  Visualized bowel is otherwise grossly unremarkable.  Vascular/Lymphatic:  No evidence of abdominal aortic aneurysm.  No suspicious abdominal lymphadenopathy.  Other:  Moderate upper abdominal ascites.  Musculoskeletal: No focal osseous lesions.  IMPRESSION: Motion degraded images.  Cholelithiasis, without associated findings to suggest acute cholecystitis. No intrahepatic or extrahepatic ductal dilatation. Common duct measures 4 mm. No choledocholithiasis is seen.  Suspected cirrhosis. Moderate hepatic steatosis. Scattered hepatic cysts measuring up to 14 mm, benign.  Additional ancillary findings as above.  Electronically Signed   By: Julian Hy M.D.   On: 02/28/2022 20:16 US Abdomen Limited RUQ (LIVER/GB) CLINICAL DATA:  History of cholelithiasis with right upper quadrant pain, initial encounter  EXAM: ULTRASOUND ABDOMEN LIMITED RIGHT UPPER QUADRANT  COMPARISON:  CT from the previous day.  FINDINGS: Gallbladder:  Gallbladder is decompressed with wall echo shadow sign consistent with cholelithiasis. This is  similar to that seen on recent CT examination. Wall is at the upper limits of normal. No pericholecystic fluid is seen. Negative sonographic Murphy's sign is noted.  Common bile duct:  Diameter: 4.3 mm.  Liver:  Increased in echogenicity consistent with fatty liver. Portal vein is patent on color Doppler imaging with normal direction of blood flow towards the liver.  Other: Mild ascites is seen.  IMPRESSION: Cholelithiasis in a contracted gallbladder  Ascites and fatty infiltration of the liver.  Electronically Signed   By: Inez Catalina M.D.   On: 02/28/2022 01:14       Assessment and Plan  Suspected proctitis - improved pain Cirrhosis and steatosis w/ worsening LFT's Ascites - liver disease vs cardiac or both as cause Elevated INR  Gallstones   Proctitis symptoms better - continue mesalamine Known liver disease - now with ascites and worsening transaminases - paracentesis appropriate - but elevated INR an issue so would hold off right now - will do more serologic w/u (autoimmine)though am thinking this could be from amiodarone. Eliquis held - can elevate INR - once off x 48-72 hrs should have a better idea but this is concerning for deteriorating hepatic fx  Gatha Mayer, MD, [facility] See Shea Evans on call - gastroenterology for best contact person 03/01/2022 3:28 PM

## 2022-03-01 NOTE — Progress Notes (Signed)
Subjective/Chief Complaint: One BM recorded No nausea or vomiting MRCP negative for choledocholithiasis or acute cholecystitis GI recommended mesalamine enemas   Objective: Vital signs in last 24 hours: Temp:  [97.3 F (36.3 C)-98.3 F (36.8 C)] 97.8 F (36.6 C) (10/28 0846) Pulse Rate:  [84-94] 87 (10/28 0846) Resp:  [14-18] 15 (10/28 0846) BP: (90-103)/(59-69) 90/60 (10/28 0846) SpO2:  [93 %-100 %] 100 % (10/28 0846) Last BM Date : 02/27/22  Intake/Output from previous day: No intake/output data recorded. Intake/Output this shift: No intake/output data recorded.   Lab Results:  Recent Labs    02/27/22 2047 03/01/22 0203  WBC 7.2 6.2  HGB 10.4* 10.1*  HCT 30.5* 31.1*  PLT 126* 97*   BMET Recent Labs    02/27/22 2047 03/01/22 0203  NA 139 136  K 3.9 4.9  CL 94* 92*  CO2 31 28  GLUCOSE 99 114*  BUN 34* 41*  CREATININE 6.57* 8.72*  CALCIUM 9.7 9.7   PT/INR Recent Labs    02/28/22 1814 03/01/22 0203  LABPROT 47.7* 43.0*  INR 5.2* 4.6*      Latest Ref Rng & Units 03/01/2022    2:03 AM 02/28/2022    6:14 PM 02/27/2022    8:47 PM  Hepatic Function  Total Protein 6.5 - 8.1 g/dL 6.2   6.9   Albumin 3.5 - 5.0 g/dL 2.8   3.7   AST 15 - 41 U/L 293   231   ALT 0 - 44 U/L 160   126   Alk Phosphatase 38 - 126 U/L 111   115   Total Bilirubin 0.3 - 1.2 mg/dL 3.1  3.2  3.0   Bilirubin, Direct 0.0 - 0.2 mg/dL  1.5      Studies/Results: MR ABDOMEN MRCP WO CONTRAST  Result Date: 02/28/2022 CLINICAL DATA:  Cholelithiasis EXAM: MRI ABDOMEN WITHOUT CONTRAST  (INCLUDING MRCP) TECHNIQUE: Multiplanar multisequence MR imaging of the abdomen was performed. Heavily T2-weighted images of the biliary and pancreatic ducts were obtained, and three-dimensional MRCP images were rendered by post processing. COMPARISON:  Right upper quadrant ultrasound dated 02/28/2022. CT abdomen/pelvis dated 02/27/2022. FINDINGS: Motion degraded images. Lower chest: Small bilateral  pleural effusions. Hepatobiliary: Mildly nodular hepatic contour, suggesting cirrhosis. Moderate hepatic steatosis. Scattered probable hepatic cysts, measuring up to 14 mm in segment 4A (series 13/image 15). Contracted gallbladder with cholelithiasis. No associated inflammatory changes to suggest acute cholecystitis. No intrahepatic or extrahepatic ductal dilatation. Common duct measures 4 mm. No choledocholithiasis is seen. Pancreas:  Within normal limits. Spleen:  Within normal limits. Adrenals/Urinary Tract:  Adrenal glands are within normal limits. Kidneys are notable for multiple simple bilateral renal cysts, measuring up to 2.0 cm in the right upper kidney (series 4/image 17), benign (Bosniak I). Additional bilateral hemorrhagic lesions, with intrinsic T1 hyperintensity and intermediate/dark on T2, measuring up to 13 mm in the medial interpolar right kidney (series 4/image 26) and 17 mm in the anterior interpolar left kidney (series 4/image 27), benign (Bosniak II). No follow-up is recommended. No hydronephrosis. Stomach/Bowel: Stomach is within normal limits. Susceptibility artifact in the second portion of the duodenum, related to the patient's hemostasis clip from recent EGD. Visualized bowel is otherwise grossly unremarkable. Vascular/Lymphatic:  No evidence of abdominal aortic aneurysm. No suspicious abdominal lymphadenopathy. Other:  Moderate upper abdominal ascites. Musculoskeletal: No focal osseous lesions. IMPRESSION: Motion degraded images. Cholelithiasis, without associated findings to suggest acute cholecystitis. No intrahepatic or extrahepatic ductal dilatation. Common duct measures 4 mm. No choledocholithiasis is seen.  Suspected cirrhosis. Moderate hepatic steatosis. Scattered hepatic cysts measuring up to 14 mm, benign. Additional ancillary findings as above. Electronically Signed   By: Julian Hy M.D.   On: 02/28/2022 20:16   US Abdomen Limited RUQ (LIVER/GB)  Result Date:  02/28/2022 CLINICAL DATA:  History of cholelithiasis with right upper quadrant pain, initial encounter EXAM: ULTRASOUND ABDOMEN LIMITED RIGHT UPPER QUADRANT COMPARISON:  CT from the previous day. FINDINGS: Gallbladder: Gallbladder is decompressed with wall echo shadow sign consistent with cholelithiasis. This is similar to that seen on recent CT examination. Wall is at the upper limits of normal. No pericholecystic fluid is seen. Negative sonographic Murphy's sign is noted. Common bile duct: Diameter: 4.3 mm. Liver: Increased in echogenicity consistent with fatty liver. Portal vein is patent on color Doppler imaging with normal direction of blood flow towards the liver. Other: Mild ascites is seen. IMPRESSION: Cholelithiasis in a contracted gallbladder Ascites and fatty infiltration of the liver. Electronically Signed   By: Inez Catalina M.D.   On: 02/28/2022 01:14   CT ABDOMEN PELVIS W CONTRAST  Result Date: 02/27/2022 CLINICAL DATA:  Abdominal pain, acute nonlocalized. Concern for rectal abscess. EXAM: CT ABDOMEN AND PELVIS WITH CONTRAST TECHNIQUE: Multidetector CT imaging of the abdomen and pelvis was performed using the standard protocol following bolus administration of intravenous contrast. RADIATION DOSE REDUCTION: This exam was performed according to the departmental dose-optimization program which includes automated exposure control, adjustment of the mA and/or kV according to patient size and/or use of iterative reconstruction technique. CONTRAST:  120m OMNIPAQUE IOHEXOL 300 MG/ML  SOLN COMPARISON:  11/29/2021 FINDINGS: Lower chest: Small bilateral pleural effusions with basilar atelectasis, greater on the left. Hazy interstitial pattern to the lung bases likely representing edema. Cardiac enlargement. Coronary artery calcifications. Hepatobiliary: Subcentimeter low-attenuation lesions in the liver are too small to characterize but likely represent small cysts or hemangiomas. No change since prior  study. No imaging follow-up is indicated. Probable hepatic cirrhosis with enlarged lateral segment of the left lobe of the liver. Cholelithiasis with multiple stones filling the gallbladder. Gallbladder is contracted. No bile duct dilatation. Pancreas: Unremarkable. No pancreatic ductal dilatation or surrounding inflammatory changes. Spleen: Normal in size without focal abnormality. Adrenals/Urinary Tract: No adrenal gland nodules. Renal nephrograms are delayed consistent with history of renal insufficiency. Multiple bilateral renal cysts, largest on the right measuring 2.2 cm diameter. No imaging follow-up is indicated. No hydronephrosis or hydroureter. Bladder is normal. Stomach/Bowel: Stomach, small bowel, and colon are not abnormally distended. Mild scattered hyperdense material likely representing ingested material. Diverticulosis of the sigmoid colon without evidence of acute diverticulitis. Appendix is normal. The anorectal wall appears diffusely thickened without pouching of the lumen over the thickened segment. This could represent proctitis or rectal mass. Direct visualization is suggested. Vascular/Lymphatic: Diffuse aortic calcification. Scattered mural thrombus. No aneurysm or dissection. Focal stenosis of the right external iliac artery likely representing about 70% diameter reduction. No significant lymphadenopathy. Reproductive: Prostate gland is unremarkable. Other: Moderate diffuse abdominal and pelvic free fluid, likely ascites. Edema throughout the subcutaneous soft tissues. No loculated collections. No free air. Musculoskeletal: Degenerative changes. No destructive bone lesions. Mild diffuse bone sclerosis may represent renal osteodystrophy. Curvilinear sclerosis in the right femoral head suggesting avascular necrosis. IMPRESSION: 1. No perirectal abscess is identified but there is diffuse wall thickening of the low rectum just above the anus. This could represent proctitis or rectal mass.  Direct visualization is suggested. 2. Hepatic cirrhosis. 3. Cholelithiasis with multiple stones filling the gallbladder. 4. Small  bilateral pleural effusions. Diffuse abdominal and pelvic ascites. Diffuse soft tissue edema. 5. Aortic atherosclerosis with significant focal stenosis demonstrated in the right external iliac artery. 6. Cardiac enlargement with probable edema in the lung bases. Electronically Signed   By: Lucienne Capers M.D.   On: 02/27/2022 22:33    Anti-infectives: Anti-infectives (From admission, onward)    Start     Dose/Rate Route Frequency Ordered Stop   02/28/22 0300  vancomycin (VANCOCIN) IVPB 1000 mg/200 mL premix        1,000 mg 200 mL/hr over 60 Minutes Intravenous  Once 02/28/22 0251 02/28/22 0448   02/28/22 0300  piperacillin-tazobactam (ZOSYN) IVPB 3.375 g        3.375 g 100 mL/hr over 30 Minutes Intravenous  Once 02/28/22 0251 02/28/22 0341   02/28/22 0000  amoxicillin-clavulanate (AUGMENTIN) 875-125 MG tablet        1 tablet Oral Every 12 hours 02/28/22 0022     02/27/22 2330  amoxicillin-clavulanate (AUGMENTIN) 875-125 MG per tablet 1 tablet        1 tablet Oral  Once 02/27/22 2318 02/28/22 0106       Assessment/Plan: Cholelithiasis  Hepatic cirrhosis - otherwise negative MRCP   No indications for surgical intervention Rectal Pain - Mesalamine enemas per GI  Poor overall surgical candidate if his condition worsens   FEN - Renal, IVF per Neprho and TRH VTE - SCDs, hold Eliquis, okay for heparin GTT if needed ID - no antibiotics indicated by our team at this time Dispo - Admit to [facility].    ESRD on HD (MWF) HTN HLD CAD Prior tobacco abuse (quit 12/22) Etoh abuse (quit 12/22) with Cirrhosis PAF s/p DCCV in 1/23 on Eliquis OSA  CHFrEF (EF 25-30% - 10/31/21) on midodrine with severe mitral regurg (not candidate for Mitraclip per cards note 9/26) - CT w/ cardiac enlargement with probable edema in the lung bases. Aortic atherosclerosis with significant  focal stenosis demonstrated in the right external iliac artery on CT  LOS: 1 day    Maia Petties 03/01/2022

## 2022-03-01 NOTE — Evaluation (Signed)
Occupational Therapy Evaluation Patient Details Name: Ruben [name] MRN: [id number] DOB: [birth year] Today's Date: 03/01/2022   History of Present Illness 76 y/o male presented to ED on 02/27/22 for hemorrhoid check to rule out rectal abscess. CT showed rectal wall thickening and cirrhosis. PMH: proctitis, CHF, severe mitral regurgitation, cirrhosis, hx of ETOH abuse, PAF, hx of MI   Clinical Impression   Prior to this admission, patient living with his wife with daughter providing assist. Daughter lives 2 miles away however is over every day to provide assistance. Patient requires min A for ADLs and showering, and typically walks with a walker. Currently, patient close to baseline for ADLs, limited by activity tolerance and endurance, would benefit from demo of AE to promote increased independence  (education initiated with regard to AE). Patient min guard for mobility with RW, however fatigues easily. OT will continue to follow acutely, but with no post acute OT needs.      Recommendations for follow up therapy are one component of a multi-disciplinary discharge planning process, led by the attending physician.  Recommendations may be updated based on patient status, additional functional criteria and insurance authorization.   Follow Up Recommendations  No OT follow up    Assistance Recommended at Discharge Intermittent Supervision/Assistance  Patient can return home with the following A little help with walking and/or transfers;A little help with bathing/dressing/bathroom;Assistance with cooking/housework;Assist for transportation;Help with stairs or ramp for entrance    Functional Status Assessment  Patient has had a recent decline in their functional status and demonstrates the ability to make significant improvements in function in a reasonable and predictable amount of time.  Equipment Recommendations  None recommended by OT (Patient has DME needed)    Recommendations for  Other Services       Precautions / Restrictions Precautions Precautions: Fall Restrictions Weight Bearing Restrictions: No      Mobility Bed Mobility               General bed mobility comments: up in chair upon arrival    Transfers Overall transfer level: Needs assistance   Transfers: Sit to/from Stand Sit to Stand: Min guard           General transfer comment: min gaurd for safety, minimal fatigue noted with ambulation, daughter endorses this is becoming more frequent      Balance Overall balance assessment: Needs assistance Sitting-balance support: Feet supported, No upper extremity supported Sitting balance-[name] Scale: Fair     Standing balance support: During functional activity, Reliant on assistive device for balance Standing balance-[name] Scale: Poor Standing balance comment: reliant on RW                           ADL either performed or assessed with clinical judgement   ADL Overall ADL's : Needs assistance/impaired Eating/Feeding: Set up;Sitting   Grooming: Set up;Sitting   Upper Body Bathing: Set up;Sitting   Lower Body Bathing: Minimal assistance;Moderate assistance;Sitting/lateral leans;Sit to/from stand   Upper Body Dressing : Set up;Sitting   Lower Body Dressing: Minimal assistance;Moderate assistance;Sitting/lateral leans;Sit to/from stand   Toilet Transfer: Minimal assistance;Ambulation;Rolling walker (2 wheels)           Functional mobility during ADLs: Minimal assistance;Rolling walker (2 wheels) General ADL Comments: Patient close to baseline for ADLs, limited by activity tolerance and endurance, would benefit from demo of AE to promote increased independence     Vision Baseline Vision/History: 1 Wears glasses (Readers only) Ability  to See in Adequate Light: 0 Adequate Patient Visual Report: No change from baseline       Perception     Praxis      Pertinent Vitals/Pain Pain Assessment Pain Assessment:  No/denies pain     Hand Dominance Right   Extremity/Trunk Assessment Upper Extremity Assessment Upper Extremity Assessment: Overall WFL for tasks assessed   Lower Extremity Assessment Lower Extremity Assessment: Defer to PT evaluation   Cervical / Trunk Assessment Cervical / Trunk Assessment: Normal   Communication Communication Communication: No difficulties   Cognition Arousal/Alertness: Awake/alert Behavior During Therapy: WFL for tasks assessed/performed Overall Cognitive Status: Within Functional Limits for tasks assessed                                       General Comments       Exercises     Shoulder Instructions      Home Living Family/patient expects to be discharged to:: Private residence Living Arrangements: Spouse/significant other Available Help at Discharge: Family Type of Home: House Home Access: Stairs to enter Technical brewer of Steps: 2 Entrance Stairs-Rails: Right (uses rollator to assist on opposite side) Home Layout: One level     Bathroom Shower/Tub: Tub/shower unit;Door         Home Equipment: Conservation officer, nature (2 wheels);Rollator (4 wheels);Cane - single point;Toilet riser;Wheelchair - Biomedical scientist Comments: Pts daughter lives within 2 miles of the home      Prior Functioning/Environment Prior Level of Function : Needs assist             Mobility Comments: Pt typically ambulates with rollator. Reports 2 falls in last 6 months. ADLs Comments: Wife and daughter assist with lower body ADLs and showering (when needed), daughter assists with IADL management        OT Problem List: Decreased strength;Decreased activity tolerance;Impaired balance (sitting and/or standing);Decreased coordination;Decreased safety awareness;Decreased knowledge of use of DME or AE      OT Treatment/Interventions: Self-care/ADL training;Therapeutic exercise;Energy conservation;DME and/or AE instruction;Manual  therapy;Therapeutic activities;Patient/family education;Balance training    OT Goals(Current goals can be found in the care plan section) Acute Rehab OT Goals Patient Stated Goal: to get better OT Goal Formulation: With patient Time For Goal Achievement: 03/30/2022 Potential to Achieve Goals: Good ADL Goals Pt Will Transfer to Toilet: with supervision;ambulating Pt Will Perform Toileting - Clothing Manipulation and hygiene: with supervision Additional ADL Goal #1: Patient will be able to verbalize 3 strategies for fall prevention to promote increased independence for safe discharge home. Additional ADL Goal #2: Patient will be able to verbalize 3 strategies for energy conservation to promote increased independence for safe discharge home.  OT Frequency: Min 2X/week    Co-evaluation              AM-PAC OT "6 Clicks" Daily Activity     Outcome Measure Help from another person eating meals?: A Little Help from another person taking care of personal grooming?: A Little Help from another person toileting, which includes using toliet, bedpan, or urinal?: A Little Help from another person bathing (including washing, rinsing, drying)?: A Little Help from another person to put on and taking off regular upper body clothing?: A Little Help from another person to put on and taking off regular lower body clothing?: A Little 6 Click Score: 18   End of Session Equipment Utilized During Treatment: Gait belt;Rolling walker (2  wheels) Nurse Communication: Mobility status  Activity Tolerance: Patient tolerated treatment well Patient left: in chair;with call bell/phone within reach;with family/visitor present  OT Visit Diagnosis: Unsteadiness on feet (R26.81);Muscle weakness (generalized) (M62.81);Repeated falls (R29.6);History of falling (Z91.81)                Time: 1342-1400 OT Time Calculation (min): 18 min Charges:  OT General Charges $OT Visit: 1 Visit OT Evaluation $OT Eval Moderate  Complexity: 1 Mod  Corinne Ports E. Greco Gastelum, OTR/L Acute Rehabilitation Services [phone]   Ascencion Dike 03/01/2022, 3:39 PM

## 2022-03-01 NOTE — Care Management CC44 (Signed)
Condition Code 44 Documentation Completed  Patient Details  Name: Ruben [name] MRN: [id number] Date of Birth: October 15, 1945   Condition Code 44 given:  Yes Patient signature on Condition Code 44 notice:  Yes Documentation of 2 MD's agreement:  Yes Code 44 added to claim:  Yes    Bartholomew Crews, RN 03/01/2022, 11:33 AM

## 2022-03-01 NOTE — Progress Notes (Signed)
PROGRESS NOTE    Ruben [name]  IRS:[id number] DOB: Oct 19, 1945 DOA: 02/27/2022 PCP: [name], MD   Brief Narrative:  76 year old with history of ESRD on HD MWF, chronic CHF with reduced EF, P A-fib, alcohol and tobacco abuse, HTN, HLD, MR not a candidate for mitral clip, depression came to the ED with 1 week of rectal pain.  He was seen by his PCP and sent to [place] for CT scan due to concerns of fistula as he has a history of anal fissure and hemorrhoids followed by Dr. Almyra Free.  He was also recently admitted about a month ago for GI bleed requiring PRBC transfusion and endoscopy showing Barrett's esophagus and angiodysplastic lesion. During this admission for blood transaminitis, right upper quadrant showed cholelithiasis with contracted gallbladder, ascites, fatty liver.  CT showed concerns of cholelithiasis, hepatic cirrhosis and possible proctitis.  General surgery was consulted   Assessment & Plan:  Principal Problem:   Prostatitis Active Problems:   Transaminitis   Cholelithiasis   ESRD on hemodialysis (HCC)   Paroxysmal atrial fibrillation (HCC)   Chronic systolic CHF (congestive heart failure) (HCC)   CAD (coronary artery disease)   Mitral regurgitation   Anxiety and Depression   HLD (hyperlipidemia)   Normocytic anemia     Assessment and Plan: * Prostatitis -Followed by GI.  At this time recommending mesalamine enema and monitoring LFTs  Transaminitis with fatty infiltration/cirrhosis, slowly trending up Supratherapeutic INR Hx of transminitis in February of 2023 and underwent MRI  liver due to elevated LFTs which showed 12 mm benign lesions.  Does have a history of alcohol use but also patient has severe MR/CHF and on amiodarone.  GI team recommending following LFTs. INR 4.6.  We can consider giving IV vitamin K if necessary  Cholelithiasis Elevated liver enzymes and bilirutin in setting of cirrhosis.  Negative Murphy sign on gallbladder  ultrasound.  MRCP does not show any obvious obstruction, general surgery following.  If surgical need arises we will consult cardiology for further clearance given extensive cardiac history  ESRD on hemodialysis [facility]) HD on MWF, last session was on Wednesday, nephrology following for HD needs  Paroxysmal atrial fibrillation ([place]) Currently rate controlled on amiodarone.  Eliquis on hold  Anemia of chronic disease Thrombocytopenia - No active bleeding, monitor.  Hemoglobin at baseline of 10.0.  Platelets are slightly dropped from 140s range to 90s we will continue to monitor Check Iron studies   Elevated AG -Monitor with HD  Chronic systolic CHF (congestive heart failure) ([place]) TEE 06/23: EF 25-30%, RV mildly reduced, severe ischemic MR No signs of volume overload, does not make urine Volume control per DM  CAD (coronary artery disease) LHC (1/23): Occluded RCA with collaterals, serial 50%/80%/80% mid-distal LCx stenoses, serial 60%/70%/50% proximal to mid LAD stenoses, 70% ostial D1. No good PCI targets and not a CABG candidate.  Chest pain-free  Mitral regurgitation Severe ischemic MR on 6/23 TEE.  Not candidate for Mitraclip per structural heat team due to poor functional capacity and challenging imaging.   Anxiety and Depression Continue zoloft   HLD (hyperlipidemia) Hold statin   DVT prophylaxis: Supratherapeutic INR Code Status: Full Family Communication: Significant other at bedside  Status is: Inpatient Remains inpatient appropriate because: Multiple ongoing medical issues, will be in the hospital for atleast 3-4 days   Nutritional status          Body mass index is 22.24 kg/m.         Subjective:  Feeling okay no complaints.  Tells me he has quit drinking for at least 10 months now.  Admits of medication compliance and follows outpatient with CHF team. Uses 2 L nasal cannula at night  Examination:  General exam: Appears calm and comfortable, 2 L  nasal cannula Respiratory system: Basilar crackles Cardiovascular system: S1 & S2 heard, RRR. No JVD, murmurs, rubs, gallops or clicks. No pedal edema. Gastrointestinal system: Abdomen is nondistended, soft and nontender. No organomegaly or masses felt. Normal bowel sounds heard. Central nervous system: Alert and oriented. No focal neurological deficits. Extremities: Symmetric 5 x 5 power. Skin: No rashes, lesions or ulcers Psychiatry: Judgement and insight appear normal. Mood & affect appropriate.     Objective: Vitals:   02/28/22 1626 02/28/22 1700 02/28/22 2104 03/01/22 0522  BP: 95/61 (!) 102/59 103/68 93/61  Pulse: 89 94 90 87  Resp: '16 18  17  '$ Temp: 98 F (36.7 C) 97.8 F (36.6 C) 97.7 F (36.5 C) 98.3 F (36.8 C)  TempSrc:  Oral Oral   SpO2: 94% 100% 93% 94%  Weight:      Height:        Intake/Output Summary (Last 24 hours) at 03/01/2022 0746 Last data filed at 03/01/2022 0551 Gross per 24 hour  Intake --  Output 0 ml  Net 0 ml   Filed Weights   02/27/22 2037  Weight: 70.3 kg     Data Reviewed:   CBC: Recent Labs  Lab 02/27/22 2047 03/01/22 0203  WBC 7.2 6.2  NEUTROABS 6.0  --   HGB 10.4* 10.1*  HCT 30.5* 31.1*  MCV 92.4 96.3  PLT 126* 97*   Basic Metabolic Panel: Recent Labs  Lab 02/27/22 2047 03/01/22 0203  NA 139 136  K 3.9 4.9  CL 94* 92*  CO2 31 28  GLUCOSE 99 114*  BUN 34* 41*  CREATININE 6.57* 8.72*  CALCIUM 9.7 9.7   GFR: Estimated Creatinine Clearance: 7.2 mL/min (A) (by C-G formula based on SCr of 8.72 mg/dL (H)). Liver Function Tests: Recent Labs  Lab 02/27/22 2047 02/28/22 1814 03/01/22 0203  AST 231*  --  293*  ALT 126*  --  160*  ALKPHOS 115  --  111  BILITOT 3.0* 3.2* 3.1*  PROT 6.9  --  6.2*  ALBUMIN 3.7  --  2.8*   Recent Labs  Lab 02/28/22 1814  LIPASE 61*   No results for input(s): "AMMONIA" in the last 168 hours. Coagulation Profile: Recent Labs  Lab 02/28/22 1814 03/01/22 0203  INR 5.2* 4.6*    Cardiac Enzymes: No results for input(s): "CKTOTAL", "CKMB", "CKMBINDEX", "TROPONINI" in the last 168 hours. BNP (last 3 results) No results for input(s): "PROBNP" in the last 8760 hours. HbA1C: No results for input(s): "HGBA1C" in the last 72 hours. CBG: Recent Labs  Lab 02/28/22 1348  GLUCAP 87   Lipid Profile: No results for input(s): "CHOL", "HDL", "LDLCALC", "TRIG", "CHOLHDL", "LDLDIRECT" in the last 72 hours. Thyroid Function Tests: No results for input(s): "TSH", "T4TOTAL", "FREET4", "T3FREE", "THYROIDAB" in the last 72 hours. Anemia Panel: No results for input(s): "VITAMINB12", "FOLATE", "FERRITIN", "TIBC", "IRON", "RETICCTPCT" in the last 72 hours. Sepsis Labs: Recent Labs  Lab 02/27/22 2047 02/28/22 0300  LATICACIDVEN 2.1* 1.9    No results found for this or any previous visit (from the past 240 hour(s)).       Radiology Studies: MR ABDOMEN MRCP WO CONTRAST  Result Date: 02/28/2022 CLINICAL DATA:  Cholelithiasis EXAM: MRI ABDOMEN WITHOUT CONTRAST  (  INCLUDING MRCP) TECHNIQUE: Multiplanar multisequence MR imaging of the abdomen was performed. Heavily T2-weighted images of the biliary and pancreatic ducts were obtained, and three-dimensional MRCP images were rendered by post processing. COMPARISON:  Right upper quadrant ultrasound dated 02/28/2022. CT abdomen/pelvis dated 02/27/2022. FINDINGS: Motion degraded images. Lower chest: Small bilateral pleural effusions. Hepatobiliary: Mildly nodular hepatic contour, suggesting cirrhosis. Moderate hepatic steatosis. Scattered probable hepatic cysts, measuring up to 14 mm in segment 4A (series 13/image 15). Contracted gallbladder with cholelithiasis. No associated inflammatory changes to suggest acute cholecystitis. No intrahepatic or extrahepatic ductal dilatation. Common duct measures 4 mm. No choledocholithiasis is seen. Pancreas:  Within normal limits. Spleen:  Within normal limits. Adrenals/Urinary Tract:  Adrenal glands  are within normal limits. Kidneys are notable for multiple simple bilateral renal cysts, measuring up to 2.0 cm in the right upper kidney (series 4/image 17), benign (Bosniak I). Additional bilateral hemorrhagic lesions, with intrinsic T1 hyperintensity and intermediate/dark on T2, measuring up to 13 mm in the medial interpolar right kidney (series 4/image 26) and 17 mm in the anterior interpolar left kidney (series 4/image 27), benign (Bosniak II). No follow-up is recommended. No hydronephrosis. Stomach/Bowel: Stomach is within normal limits. Susceptibility artifact in the second portion of the duodenum, related to the patient's hemostasis clip from recent EGD. Visualized bowel is otherwise grossly unremarkable. Vascular/Lymphatic:  No evidence of abdominal aortic aneurysm. No suspicious abdominal lymphadenopathy. Other:  Moderate upper abdominal ascites. Musculoskeletal: No focal osseous lesions. IMPRESSION: Motion degraded images. Cholelithiasis, without associated findings to suggest acute cholecystitis. No intrahepatic or extrahepatic ductal dilatation. Common duct measures 4 mm. No choledocholithiasis is seen. Suspected cirrhosis. Moderate hepatic steatosis. Scattered hepatic cysts measuring up to 14 mm, benign. Additional ancillary findings as above. Electronically Signed   By: Julian Hy M.D.   On: 02/28/2022 20:16   US Abdomen Limited RUQ (LIVER/GB)  Result Date: 02/28/2022 CLINICAL DATA:  History of cholelithiasis with right upper quadrant pain, initial encounter EXAM: ULTRASOUND ABDOMEN LIMITED RIGHT UPPER QUADRANT COMPARISON:  CT from the previous day. FINDINGS: Gallbladder: Gallbladder is decompressed with wall echo shadow sign consistent with cholelithiasis. This is similar to that seen on recent CT examination. Wall is at the upper limits of normal. No pericholecystic fluid is seen. Negative sonographic Murphy's sign is noted. Common bile duct: Diameter: 4.3 mm. Liver: Increased in  echogenicity consistent with fatty liver. Portal vein is patent on color Doppler imaging with normal direction of blood flow towards the liver. Other: Mild ascites is seen. IMPRESSION: Cholelithiasis in a contracted gallbladder Ascites and fatty infiltration of the liver. Electronically Signed   By: Inez Catalina M.D.   On: 02/28/2022 01:14   CT ABDOMEN PELVIS W CONTRAST  Result Date: 02/27/2022 CLINICAL DATA:  Abdominal pain, acute nonlocalized. Concern for rectal abscess. EXAM: CT ABDOMEN AND PELVIS WITH CONTRAST TECHNIQUE: Multidetector CT imaging of the abdomen and pelvis was performed using the standard protocol following bolus administration of intravenous contrast. RADIATION DOSE REDUCTION: This exam was performed according to the departmental dose-optimization program which includes automated exposure control, adjustment of the mA and/or kV according to patient size and/or use of iterative reconstruction technique. CONTRAST:  132m OMNIPAQUE IOHEXOL 300 MG/ML  SOLN COMPARISON:  11/29/2021 FINDINGS: Lower chest: Small bilateral pleural effusions with basilar atelectasis, greater on the left. Hazy interstitial pattern to the lung bases likely representing edema. Cardiac enlargement. Coronary artery calcifications. Hepatobiliary: Subcentimeter low-attenuation lesions in the liver are too small to characterize but likely represent small cysts or hemangiomas. No  change since prior study. No imaging follow-up is indicated. Probable hepatic cirrhosis with enlarged lateral segment of the left lobe of the liver. Cholelithiasis with multiple stones filling the gallbladder. Gallbladder is contracted. No bile duct dilatation. Pancreas: Unremarkable. No pancreatic ductal dilatation or surrounding inflammatory changes. Spleen: Normal in size without focal abnormality. Adrenals/Urinary Tract: No adrenal gland nodules. Renal nephrograms are delayed consistent with history of renal insufficiency. Multiple bilateral renal  cysts, largest on the right measuring 2.2 cm diameter. No imaging follow-up is indicated. No hydronephrosis or hydroureter. Bladder is normal. Stomach/Bowel: Stomach, small bowel, and colon are not abnormally distended. Mild scattered hyperdense material likely representing ingested material. Diverticulosis of the sigmoid colon without evidence of acute diverticulitis. Appendix is normal. The anorectal wall appears diffusely thickened without pouching of the lumen over the thickened segment. This could represent proctitis or rectal mass. Direct visualization is suggested. Vascular/Lymphatic: Diffuse aortic calcification. Scattered mural thrombus. No aneurysm or dissection. Focal stenosis of the right external iliac artery likely representing about 70% diameter reduction. No significant lymphadenopathy. Reproductive: Prostate gland is unremarkable. Other: Moderate diffuse abdominal and pelvic free fluid, likely ascites. Edema throughout the subcutaneous soft tissues. No loculated collections. No free air. Musculoskeletal: Degenerative changes. No destructive bone lesions. Mild diffuse bone sclerosis may represent renal osteodystrophy. Curvilinear sclerosis in the right femoral head suggesting avascular necrosis. IMPRESSION: 1. No perirectal abscess is identified but there is diffuse wall thickening of the low rectum just above the anus. This could represent proctitis or rectal mass. Direct visualization is suggested. 2. Hepatic cirrhosis. 3. Cholelithiasis with multiple stones filling the gallbladder. 4. Small bilateral pleural effusions. Diffuse abdominal and pelvic ascites. Diffuse soft tissue edema. 5. Aortic atherosclerosis with significant focal stenosis demonstrated in the right external iliac artery. 6. Cardiac enlargement with probable edema in the lung bases. Electronically Signed   By: Lucienne Capers M.D.   On: 02/27/2022 22:33        Scheduled Meds:  amiodarone  200 mg Oral Daily   Chlorhexidine  Gluconate Cloth  6 each Topical Q0600   gabapentin  100 mg Oral QHS   mesalamine  4 g Rectal QHS   midodrine  15 mg Oral TID WC   pantoprazole  40 mg Oral BID   sertraline  25 mg Oral q AM   sodium chloride flush  3 mL Intravenous Q12H   Continuous Infusions:  sodium chloride       LOS: 1 day   Time spent= 35 mins    Norabelle Kondo Arsenio Loader, MD [facility]  If 7PM-7AM, please contact night-coverage  03/01/2022, 7:46 AM

## 2022-03-01 NOTE — Progress Notes (Signed)
Critical lab INR 5.2 reported to MD V Rathore.Will monitor pt.

## 2022-03-01 NOTE — Progress Notes (Signed)
[facility] Progress Note  Name: Ruben [name] MRN: [id number] DOB: 1945/06/13  Chief Complaint:  Rectal pain   Subjective:  He's OBS status.  States rectal pain is a little better.  His daughter is here at bedside and she's about to walk with him down the hall.    Review of systems:  Denies shortness of breath or chest pain  Reports some nausea- not quite ready to try lunch yet  --------------- Background on consult:  Ruben [name] is a 76 y.o. male with a history including ESRD, HTN, CAD, tobacco abuse, and prior alcohol abuse who presented to the hospital with one week of rectal pain.  Note previously admitted in late September for GI bleed.  Note he quit drinking in 04/2021 per charting.  Nephrology is consulted for assistance with management of ESRD.  Spoke with his daughter at bedside; she supplements his history.  We discussed nephrology basics including dry weight.     Intake/Output Summary (Last 24 hours) at 03/01/2022 1225 Last data filed at 03/01/2022 0551 Gross per 24 hour  Intake --  Output 0 ml  Net 0 ml    Vitals:  Vitals:   03/01/22 0846 03/01/22 1000 03/01/22 1201 03/01/22 1204  BP: 90/60 102/65 97/64 100/64  Pulse: 87  88 88  Resp: 15     Temp: 97.8 F (36.6 C)     TempSrc: Oral     SpO2: 100%     Weight:      Height:         Physical Exam:  General: elderly male in bed in NAD at rest  HEENT: NCAT Eyes: EOMI sclera anicteric Neck: supple trachea midline  Heart: S1S2 no rub Lungs: clear and unlabored; room air  Abdomen: soft/nt/nd Extremities: no edema lower extremities; no cyanosis or clubbing  Neuro: awake and conversant; provides basic hx and follows commands Psych no anxiety or agitation  Access: left AVG bruit and thrill   Medications reviewed   Labs:     Latest Ref Rng & Units 03/01/2022    2:03 AM 02/27/2022    8:47 PM 02/05/2022   12:28 AM  BMP  Glucose 70 - 99 mg/dL 114  99  100   BUN 8 - 23 mg/dL  41  34  28   Creatinine 0.61 - 1.24 mg/dL 8.72  6.57  5.54   Sodium 135 - 145 mmol/L 136  139  135   Potassium 3.5 - 5.1 mmol/L 4.9  3.9  4.2   Chloride 98 - 111 mmol/L 92  94  95   CO2 22 - 32 mmol/L '28  31  27   '$ Calcium 8.9 - 10.3 mg/dL 9.7  9.7  9.1    Outpatient HD orders:  MWF [facility]  3 hrs and 45 minutes BF 400/ DF 500 2K/2 Ca EDW 70 kg (Last tx 10/25 - 69.3 kg) - sometimes has hypotension with HD AV graft Meds:  Mircera 30 mcg every 2 weeks - last given 10/23  Assessment/Plan:   # Rectal pain  - felt 2/2 non-infectious prostatitis per charting - per primary team and GI   # ESRD - HD per MWF usually  - Plan for HD today here off schedule then resume    Chronic hypotension  - controlled on midodrine   Anemia CKD - Hb acceptable at 10.1; ESA recently administered   Metabolic bone disease  - not on activated vit D    Supratherapeutic INR - per  primary team    Disposition - HD here today then dispo per primary team   Claudia Desanctis, MD 03/01/2022 12:37 PM

## 2022-03-01 NOTE — Care Management Obs Status (Signed)
[facility] NOTIFICATION   Patient Details  Name: Cartel Mauss MRN: [id number] Date of Birth: 1945/09/13   Medicare Observation Status Notification Given:  Yes    Bartholomew Crews, RN 03/01/2022, 11:33 AM

## 2022-03-02 DIAGNOSIS — R748 Abnormal levels of other serum enzymes: Secondary | ICD-10-CM | POA: Diagnosis not present

## 2022-03-02 DIAGNOSIS — E7800 Pure hypercholesterolemia, unspecified: Secondary | ICD-10-CM | POA: Diagnosis not present

## 2022-03-02 DIAGNOSIS — I129 Hypertensive chronic kidney disease with stage 1 through stage 4 chronic kidney disease, or unspecified chronic kidney disease: Secondary | ICD-10-CM | POA: Diagnosis not present

## 2022-03-02 DIAGNOSIS — N410 Acute prostatitis: Secondary | ICD-10-CM | POA: Diagnosis not present

## 2022-03-02 DIAGNOSIS — I9589 Other hypotension: Secondary | ICD-10-CM | POA: Diagnosis present

## 2022-03-02 DIAGNOSIS — H7093 Unspecified mastoiditis, bilateral: Secondary | ICD-10-CM | POA: Diagnosis not present

## 2022-03-02 DIAGNOSIS — I255 Ischemic cardiomyopathy: Secondary | ICD-10-CM

## 2022-03-02 DIAGNOSIS — K760 Fatty (change of) liver, not elsewhere classified: Secondary | ICD-10-CM | POA: Diagnosis present

## 2022-03-02 DIAGNOSIS — E669 Obesity, unspecified: Secondary | ICD-10-CM | POA: Diagnosis present

## 2022-03-02 DIAGNOSIS — D631 Anemia in chronic kidney disease: Secondary | ICD-10-CM | POA: Diagnosis present

## 2022-03-02 DIAGNOSIS — I5023 Acute on chronic systolic (congestive) heart failure: Secondary | ICD-10-CM | POA: Diagnosis not present

## 2022-03-02 DIAGNOSIS — I480 Paroxysmal atrial fibrillation: Secondary | ICD-10-CM | POA: Diagnosis present

## 2022-03-02 DIAGNOSIS — R188 Other ascites: Secondary | ICD-10-CM | POA: Diagnosis present

## 2022-03-02 DIAGNOSIS — N250 Renal osteodystrophy: Secondary | ICD-10-CM | POA: Diagnosis not present

## 2022-03-02 DIAGNOSIS — I5022 Chronic systolic (congestive) heart failure: Secondary | ICD-10-CM

## 2022-03-02 DIAGNOSIS — F064 Anxiety disorder due to known physiological condition: Secondary | ICD-10-CM | POA: Diagnosis not present

## 2022-03-02 DIAGNOSIS — D696 Thrombocytopenia, unspecified: Secondary | ICD-10-CM | POA: Diagnosis present

## 2022-03-02 DIAGNOSIS — R933 Abnormal findings on diagnostic imaging of other parts of digestive tract: Secondary | ICD-10-CM | POA: Diagnosis not present

## 2022-03-02 DIAGNOSIS — I2510 Atherosclerotic heart disease of native coronary artery without angina pectoris: Secondary | ICD-10-CM | POA: Diagnosis present

## 2022-03-02 DIAGNOSIS — K7460 Unspecified cirrhosis of liver: Secondary | ICD-10-CM | POA: Diagnosis present

## 2022-03-02 DIAGNOSIS — I340 Nonrheumatic mitral (valve) insufficiency: Secondary | ICD-10-CM | POA: Diagnosis present

## 2022-03-02 DIAGNOSIS — E1122 Type 2 diabetes mellitus with diabetic chronic kidney disease: Secondary | ICD-10-CM | POA: Diagnosis present

## 2022-03-02 DIAGNOSIS — N419 Inflammatory disease of prostate, unspecified: Secondary | ICD-10-CM | POA: Diagnosis present

## 2022-03-02 DIAGNOSIS — K8020 Calculus of gallbladder without cholecystitis without obstruction: Secondary | ICD-10-CM | POA: Diagnosis present

## 2022-03-02 DIAGNOSIS — R791 Abnormal coagulation profile: Secondary | ICD-10-CM | POA: Diagnosis present

## 2022-03-02 DIAGNOSIS — N186 End stage renal disease: Secondary | ICD-10-CM | POA: Diagnosis present

## 2022-03-02 DIAGNOSIS — R109 Unspecified abdominal pain: Secondary | ICD-10-CM | POA: Diagnosis not present

## 2022-03-02 DIAGNOSIS — R945 Abnormal results of liver function studies: Secondary | ICD-10-CM | POA: Diagnosis not present

## 2022-03-02 DIAGNOSIS — F419 Anxiety disorder, unspecified: Secondary | ICD-10-CM | POA: Diagnosis present

## 2022-03-02 DIAGNOSIS — F32A Depression, unspecified: Secondary | ICD-10-CM | POA: Diagnosis present

## 2022-03-02 DIAGNOSIS — Z992 Dependence on renal dialysis: Secondary | ICD-10-CM | POA: Diagnosis not present

## 2022-03-02 DIAGNOSIS — I700 Atherosclerosis of aorta: Secondary | ICD-10-CM | POA: Diagnosis present

## 2022-03-02 DIAGNOSIS — K8080 Other cholelithiasis without obstruction: Secondary | ICD-10-CM | POA: Diagnosis not present

## 2022-03-02 DIAGNOSIS — R7401 Elevation of levels of liver transaminase levels: Secondary | ICD-10-CM | POA: Diagnosis present

## 2022-03-02 DIAGNOSIS — I120 Hypertensive chronic kidney disease with stage 5 chronic kidney disease or end stage renal disease: Secondary | ICD-10-CM | POA: Diagnosis not present

## 2022-03-02 DIAGNOSIS — R0902 Hypoxemia: Secondary | ICD-10-CM | POA: Diagnosis present

## 2022-03-02 DIAGNOSIS — I132 Hypertensive heart and chronic kidney disease with heart failure and with stage 5 chronic kidney disease, or end stage renal disease: Secondary | ICD-10-CM | POA: Diagnosis present

## 2022-03-02 DIAGNOSIS — K6289 Other specified diseases of anus and rectum: Secondary | ICD-10-CM | POA: Diagnosis present

## 2022-03-02 LAB — CBC
HCT: 28.3 % — ABNORMAL LOW (ref 39.0–52.0)
Hemoglobin: 9.4 g/dL — ABNORMAL LOW (ref 13.0–17.0)
MCH: 31.5 pg (ref 26.0–34.0)
MCHC: 33.2 g/dL (ref 30.0–36.0)
MCV: 95 fL (ref 80.0–100.0)
Platelets: 82 10*3/uL — ABNORMAL LOW (ref 150–400)
RBC: 2.98 MIL/uL — ABNORMAL LOW (ref 4.22–5.81)
RDW: 23.1 % — ABNORMAL HIGH (ref 11.5–15.5)
WBC: 7.2 10*3/uL (ref 4.0–10.5)
nRBC: 0.8 % — ABNORMAL HIGH (ref 0.0–0.2)

## 2022-03-02 LAB — FERRITIN: Ferritin: 2487 ng/mL — ABNORMAL HIGH (ref 24–336)

## 2022-03-02 LAB — COMPREHENSIVE METABOLIC PANEL
ALT: 177 U/L — ABNORMAL HIGH (ref 0–44)
AST: 295 U/L — ABNORMAL HIGH (ref 15–41)
Albumin: 2.7 g/dL — ABNORMAL LOW (ref 3.5–5.0)
Alkaline Phosphatase: 110 U/L (ref 38–126)
Anion gap: 16 — ABNORMAL HIGH (ref 5–15)
BUN: 23 mg/dL (ref 8–23)
CO2: 26 mmol/L (ref 22–32)
Calcium: 9.3 mg/dL (ref 8.9–10.3)
Chloride: 95 mmol/L — ABNORMAL LOW (ref 98–111)
Creatinine, Ser: 5.79 mg/dL — ABNORMAL HIGH (ref 0.61–1.24)
GFR, Estimated: 9 mL/min — ABNORMAL LOW (ref >60)
Glucose, Bld: 108 mg/dL — ABNORMAL HIGH (ref 70–99)
Potassium: 4 mmol/L (ref 3.5–5.1)
Sodium: 137 mmol/L (ref 135–145)
Total Bilirubin: 3.6 mg/dL — ABNORMAL HIGH (ref 0.3–1.2)
Total Protein: 6 g/dL — ABNORMAL LOW (ref 6.5–8.1)

## 2022-03-02 LAB — IRON AND TIBC
Iron: 109 ug/dL (ref 45–182)
Saturation Ratios: 41 % — ABNORMAL HIGH (ref 17.9–39.5)
TIBC: 263 ug/dL (ref 250–450)
UIBC: 154 ug/dL

## 2022-03-02 LAB — PROTIME-INR
INR: 3.7 — ABNORMAL HIGH (ref 0.8–1.2)
Prothrombin Time: 36 seconds — ABNORMAL HIGH (ref 11.4–15.2)

## 2022-03-02 LAB — MAGNESIUM: Magnesium: 2.4 mg/dL (ref 1.7–2.4)

## 2022-03-02 LAB — AMMONIA: Ammonia: 33 umol/L (ref 9–35)

## 2022-03-02 LAB — HEPATITIS B SURFACE ANTIBODY, QUANTITATIVE: Hep B S AB Quant (Post): 21.1 m[IU]/mL (ref >9.9)

## 2022-03-02 LAB — FOLATE: Folate: 9.4 ng/mL (ref >5.9)

## 2022-03-02 MED ORDER — VITAMIN K1 10 MG/ML IJ SOLN
10.0000 mg | Freq: Once | INTRAVENOUS | Status: AC
  Administered 2022-03-02: 10 mg via INTRAVENOUS
  Filled 2022-03-02: qty 1

## 2022-03-02 MED ORDER — CHLORHEXIDINE GLUCONATE CLOTH 2 % EX PADS: 6.0000 | MEDICATED_PAD | Freq: Every day | CUTANEOUS | Status: DC

## 2022-03-02 NOTE — Progress Notes (Signed)
PROGRESS NOTE    Ruben [name]  PJA:[id number] DOB: [birth year] DOA: 02/27/2022 PCP: [name], MD   Brief Narrative:  76 year old with history of ESRD on HD MWF, chronic CHF with reduced EF, P A-fib, alcohol and tobacco abuse, HTN, HLD, MR not a candidate for mitral clip, depression came to the ED with 1 week of rectal pain.  He was seen by his PCP and sent to [place] for CT scan due to concerns of fistula as he has a history of anal fissure and hemorrhoids followed by Dr. Almyra Free.  He was also recently admitted about a month ago for GI bleed requiring PRBC transfusion and endoscopy showing Barrett's esophagus and angiodysplastic lesion. During this admission for blood transaminitis, right upper quadrant showed cholelithiasis with contracted gallbladder, ascites, fatty liver.  CT showed concerns of cholelithiasis, hepatic cirrhosis and possible proctitis.  Status post MRCP which did not show obvious obstruction, no plans for surgical intervention.   Assessment & Plan:  Principal Problem:   Prostatitis Active Problems:   Transaminitis   Cholelithiasis   ESRD on hemodialysis (HCC)   Paroxysmal atrial fibrillation (HCC)   Chronic systolic CHF (congestive heart failure) (HCC)   CAD (coronary artery disease)   Mitral regurgitation   Anxiety and Depression   HLD (hyperlipidemia)   Normocytic anemia   Cirrhosis of liver without ascites (HCC)     Assessment and Plan: * Prostatitis -Followed by GI.  Rectal pain improving with mesalamine enema.  Monitor LFTs  Transaminitis with fatty infiltration/cirrhosis, slowly trending up Supratherapeutic INR Hx of transminitis in February of 2023 and underwent MRI  liver due to elevated LFTs which showed 12 mm benign lesions.  Does have a history of alcohol use but also patient has severe MR/CHF and on amiodarone but low suspicion. .  Hepatitis panel is negative.  GI team recommending following LFTs. INR 3.7. Suspect May need  Vit K if GI would really like to proceed with Paracentesis.   Cholelithiasis Elevated liver enzymes and bilirutin in setting of cirrhosis.  Negative Murphy sign on gallbladder ultrasound.  MRCP does not show any obvious obstruction, general surgery following, no further surgical intervention  ESRD on hemodialysis ([place]) HD on MWF, last session was on Wednesday, nephro following for HD  Paroxysmal atrial fibrillation (HCC) Currently rate controlled on amiodarone. Eliquis remains on Hold.  Appreciate input from cardiology team.  Anemia of chronic disease Thrombocytopenia - No active bleeding, monitor.  Hemoglobin at baseline of 10.0.  Platelets are slightly dropped from 140s range to 90s we will continue to monitor Check Iron studies, folate normal  Elevated AG -Monitor with HD  Chronic systolic CHF (congestive heart failure) ([place]) TEE 06/23: EF 25-30%, RV mildly reduced, severe ischemic MR but overall appears to be euvolemic May require RHC  CAD (coronary artery disease) LHC (1/23): Occluded RCA with collaterals, serial 50%/80%/80% mid-distal LCx stenoses, serial 60%/70%/50% proximal to mid LAD stenoses, 70% ostial D1. No good PCI targets and not a CABG candidate.  Chest pain-free  Mitral regurgitation Severe ischemic MR on 6/23 TEE.  Not candidate for Mitraclip per structural heat team due to poor functional capacity and challenging imaging.   Anxiety and Depression Continue zoloft   HLD (hyperlipidemia) Hold statin  Chronic hypoxia - Uses nocturnal 2 L nasal cannula  PT-home health   DVT prophylaxis: Place and maintain sequential compression device Start: 03/01/22 0757Supratherapeutic INR Code Status: Full Family Communication:   Status is: Inpatient Remains inpatient appropriate because: Multiple  ongoing medical issues, will be in the hospital for atleast 3-4 days    Subjective: Seen and examined at bedside.  Tells me he did not really sleep much  overnight.  Examination:  Constitutional: Not in acute distress Respiratory: Bibasilar crackles Cardiovascular: Normal sinus rhythm, no rubs Abdomen: Nontender nondistended good bowel sounds Musculoskeletal: No edema noted Skin: No rashes seen Neurologic: CN 2-12 grossly intact.  And nonfocal Psychiatric: Normal judgment and insight. Alert and oriented x 3. Normal mood.  Objective: Vitals:   03/01/22 2014 03/01/22 2018 03/01/22 2121 03/02/22 0639  BP: (!) 100/55  95/60 100/64  Pulse: 81  80 77  Resp: '18  18 18  '$ Temp: 98 F (36.7 C)  (!) 97.5 F (36.4 C) 98.4 F (36.9 C)  TempSrc: Oral  Oral Oral  SpO2: 98%  100% 100%  Weight:  69.7 kg 68.4 kg   Height:   '5\' 10"'$  (1.778 m)     Intake/Output Summary (Last 24 hours) at 03/02/2022 0735 Last data filed at 03/02/2022 0640 Gross per 24 hour  Intake 360 ml  Output 600 ml  Net -240 ml   Filed Weights   03/01/22 1533 03/01/22 2018 03/01/22 2121  Weight: 72.5 kg 69.7 kg 68.4 kg     Data Reviewed:   CBC: Recent Labs  Lab 02/27/22 2047 03/01/22 0203 03/02/22 0327  WBC 7.2 6.2 7.2  NEUTROABS 6.0  --   --   HGB 10.4* 10.1* 9.4*  HCT 30.5* 31.1* 28.3*  MCV 92.4 96.3 95.0  PLT 126* 97* 82*   Basic Metabolic Panel: Recent Labs  Lab 02/27/22 2047 03/01/22 0203 03/02/22 0327  NA 139 136 137  K 3.9 4.9 4.0  CL 94* 92* 95*  CO2 '31 28 26  '$ GLUCOSE 99 114* 108*  BUN 34* 41* 23  CREATININE 6.57* 8.72* 5.79*  CALCIUM 9.7 9.7 9.3  MG  --   --  2.4   GFR: Estimated Creatinine Clearance: 10.5 mL/min (A) (by C-G formula based on SCr of 5.79 mg/dL (H)). Liver Function Tests: Recent Labs  Lab 02/27/22 2047 02/28/22 1814 03/01/22 0203 03/02/22 0327  AST 231*  --  293* 295*  ALT 126*  --  160* 177*  ALKPHOS 115  --  111 110  BILITOT 3.0* 3.2* 3.1* 3.6*  PROT 6.9  --  6.2* 6.0*  ALBUMIN 3.7  --  2.8* 2.7*   Recent Labs  Lab 02/28/22 1814  LIPASE 61*   No results for input(s): "AMMONIA" in the last 168  hours. Coagulation Profile: Recent Labs  Lab 02/28/22 1814 03/01/22 0203 03/02/22 0327  INR 5.2* 4.6* 3.7*   Cardiac Enzymes: No results for input(s): "CKTOTAL", "CKMB", "CKMBINDEX", "TROPONINI" in the last 168 hours. BNP (last 3 results) No results for input(s): "PROBNP" in the last 8760 hours. HbA1C: No results for input(s): "HGBA1C" in the last 72 hours. CBG: Recent Labs  Lab 02/28/22 1348  GLUCAP 87   Lipid Profile: No results for input(s): "CHOL", "HDL", "LDLCALC", "TRIG", "CHOLHDL", "LDLDIRECT" in the last 72 hours. Thyroid Function Tests: No results for input(s): "TSH", "T4TOTAL", "FREET4", "T3FREE", "THYROIDAB" in the last 72 hours. Anemia Panel: Recent Labs    03/02/22 0327  FOLATE 9.4   Sepsis Labs: Recent Labs  Lab 02/27/22 2047 02/28/22 0300  LATICACIDVEN 2.1* 1.9    Recent Results (from the past 240 hour(s))  Blood culture (routine x 2)     Status: None (Preliminary result)   Collection Time: 02/27/22  8:53 PM  Specimen: BLOOD  Result Value Ref Range Status   Specimen Description   Final    BLOOD BLOOD RIGHT WRIST Performed at [facility], [address] [place], [place] [address]    Special Requests   Final    BOTTLES DRAWN AEROBIC AND ANAEROBIC Blood Culture adequate volume Performed at [facility], [address] [place], [place] [address]    Culture   Final    NO GROWTH 1 DAY Performed at [facility], [place] [address] [place], [place] [address]    Report Status PENDING  Incomplete  Blood culture (routine x 2)     Status: None (Preliminary result)   Collection Time: 02/27/22  9:06 PM   Specimen: BLOOD  Result Value Ref Range Status   Specimen Description   Final    BLOOD BLOOD RIGHT FOREARM Performed at [facility], [address] [place], [place] [address]    Special Requests   Final    BOTTLES DRAWN AEROBIC AND ANAEROBIC Blood Culture adequate volume Performed at [facility],  [address], [place], [place] [address]    Culture   Final    NO GROWTH 1 DAY Performed at [facility], [place] [address], [place], [place] [address]    Report Status PENDING  Incomplete         Radiology Studies: MR ABDOMEN MRCP WO CONTRAST  Result Date: 02/28/2022 CLINICAL DATA:  Cholelithiasis EXAM: MRI ABDOMEN WITHOUT CONTRAST  (INCLUDING MRCP) TECHNIQUE: Multiplanar multisequence MR imaging of the abdomen was performed. Heavily T2-weighted images of the biliary and pancreatic ducts were obtained, and three-dimensional MRCP images were rendered by post processing. COMPARISON:  Right upper quadrant ultrasound dated 02/28/2022. CT abdomen/pelvis dated 02/27/2022. FINDINGS: Motion degraded images. Lower chest: Small bilateral pleural effusions. Hepatobiliary: Mildly nodular hepatic contour, suggesting cirrhosis. Moderate hepatic steatosis. Scattered probable hepatic cysts, measuring up to 14 mm in segment 4A (series 13/image 15). Contracted gallbladder with cholelithiasis. No associated inflammatory changes to suggest acute cholecystitis. No intrahepatic or extrahepatic ductal dilatation. Common duct measures 4 mm. No choledocholithiasis is seen. Pancreas:  Within normal limits. Spleen:  Within normal limits. Adrenals/Urinary Tract:  Adrenal glands are within normal limits. Kidneys are notable for multiple simple bilateral renal cysts, measuring up to 2.0 cm in the right upper kidney (series 4/image 17), benign (Bosniak I). Additional bilateral hemorrhagic lesions, with intrinsic T1 hyperintensity and intermediate/dark on T2, measuring up to 13 mm in the medial interpolar right kidney (series 4/image 26) and 17 mm in the anterior interpolar left kidney (series 4/image 27), benign (Bosniak II). No follow-up is recommended. No hydronephrosis. Stomach/Bowel: Stomach is within normal limits. Susceptibility artifact in the second portion of the duodenum, related to the patient's hemostasis clip  from recent EGD. Visualized bowel is otherwise grossly unremarkable. Vascular/Lymphatic:  No evidence of abdominal aortic aneurysm. No suspicious abdominal lymphadenopathy. Other:  Moderate upper abdominal ascites. Musculoskeletal: No focal osseous lesions. IMPRESSION: Motion degraded images. Cholelithiasis, without associated findings to suggest acute cholecystitis. No intrahepatic or extrahepatic ductal dilatation. Common duct measures 4 mm. No choledocholithiasis is seen. Suspected cirrhosis. Moderate hepatic steatosis. Scattered hepatic cysts measuring up to 14 mm, benign. Additional ancillary findings as above. Electronically Signed   By: Julian Hy M.D.   On: 02/28/2022 20:16        Scheduled Meds:  amiodarone  200 mg Oral Daily   gabapentin  100 mg Oral QHS  mesalamine  4 g Rectal QHS   midodrine  15 mg Oral TID WC   pantoprazole  40 mg Oral BID   sertraline  25 mg Oral q AM   sodium chloride flush  3 mL Intravenous Q12H   Continuous Infusions:  sodium chloride       LOS: 1 day   Time spent= 35 mins    Jermya Dowding Arsenio Loader, MD [facility]  If 7PM-7AM, please contact night-coverage  03/02/2022, 7:35 AM

## 2022-03-02 NOTE — Progress Notes (Signed)
Subjective/Chief Complaint: Rectal pain improved T. Bili continues to increase slowly  Objective: Vital signs in last 24 hours: Temp:  [97.5 F (36.4 C)-100.1 F (37.8 C)] 97.8 F (36.6 C) (10/29 0900) Pulse Rate:  [77-96] 77 (10/29 0900) Resp:  [12-24] 18 (10/29 0900) BP: (91-106)/(52-66) 91/56 (10/29 0900) SpO2:  [92 %-100 %] 100 % (10/29 0639) Weight:  [68.4 kg-72.5 kg] 68.4 kg (10/28 2121) Last BM Date : 03/01/22  Intake/Output from previous day: 10/28 0701 - 10/29 0700 In: 360 [P.O.:360] Out: 600  Intake/Output this shift: No intake/output data recorded.  Abdomen - soft, non-tender  Lab Results:  Recent Labs    03/01/22 0203 03/02/22 0327  WBC 6.2 7.2  HGB 10.1* 9.4*  HCT 31.1* 28.3*  PLT 97* 82*   BMET Recent Labs    03/01/22 0203 03/02/22 0327  NA 136 137  K 4.9 4.0  CL 92* 95*  CO2 28 26  GLUCOSE 114* 108*  BUN 41* 23  CREATININE 8.72* 5.79*  CALCIUM 9.7 9.3      Latest Ref Rng & Units 03/02/2022    3:27 AM 03/01/2022    2:03 AM 02/28/2022    6:14 PM  Hepatic Function  Total Protein 6.5 - 8.1 g/dL 6.0  6.2    Albumin 3.5 - 5.0 g/dL 2.7  2.8    AST 15 - 41 U/L 295  293    ALT 0 - 44 U/L 177  160    Alk Phosphatase 38 - 126 U/L 110  111    Total Bilirubin 0.3 - 1.2 mg/dL 3.6  3.1  3.2   Bilirubin, Direct 0.0 - 0.2 mg/dL   1.5     PT/INR Recent Labs    03/01/22 0203 03/02/22 0327  LABPROT 43.0* 36.0*  INR 4.6* 3.7*   ABG No results for input(s): "PHART", "HCO3" in the last 72 hours.  Invalid input(s): "PCO2", "PO2"  Studies/Results: MR ABDOMEN MRCP WO CONTRAST  Result Date: 02/28/2022 CLINICAL DATA:  Cholelithiasis EXAM: MRI ABDOMEN WITHOUT CONTRAST  (INCLUDING MRCP) TECHNIQUE: Multiplanar multisequence MR imaging of the abdomen was performed. Heavily T2-weighted images of the biliary and pancreatic ducts were obtained, and three-dimensional MRCP images were rendered by post processing. COMPARISON:  Right upper quadrant  ultrasound dated 02/28/2022. CT abdomen/pelvis dated 02/27/2022. FINDINGS: Motion degraded images. Lower chest: Small bilateral pleural effusions. Hepatobiliary: Mildly nodular hepatic contour, suggesting cirrhosis. Moderate hepatic steatosis. Scattered probable hepatic cysts, measuring up to 14 mm in segment 4A (series 13/image 15). Contracted gallbladder with cholelithiasis. No associated inflammatory changes to suggest acute cholecystitis. No intrahepatic or extrahepatic ductal dilatation. Common duct measures 4 mm. No choledocholithiasis is seen. Pancreas:  Within normal limits. Spleen:  Within normal limits. Adrenals/Urinary Tract:  Adrenal glands are within normal limits. Kidneys are notable for multiple simple bilateral renal cysts, measuring up to 2.0 cm in the right upper kidney (series 4/image 17), benign (Bosniak I). Additional bilateral hemorrhagic lesions, with intrinsic T1 hyperintensity and intermediate/dark on T2, measuring up to 13 mm in the medial interpolar right kidney (series 4/image 26) and 17 mm in the anterior interpolar left kidney (series 4/image 27), benign (Bosniak II). No follow-up is recommended. No hydronephrosis. Stomach/Bowel: Stomach is within normal limits. Susceptibility artifact in the second portion of the duodenum, related to the patient's hemostasis clip from recent EGD. Visualized bowel is otherwise grossly unremarkable. Vascular/Lymphatic:  No evidence of abdominal aortic aneurysm. No suspicious abdominal lymphadenopathy. Other:  Moderate upper abdominal ascites. Musculoskeletal: No focal osseous  lesions. IMPRESSION: Motion degraded images. Cholelithiasis, without associated findings to suggest acute cholecystitis. No intrahepatic or extrahepatic ductal dilatation. Common duct measures 4 mm. No choledocholithiasis is seen. Suspected cirrhosis. Moderate hepatic steatosis. Scattered hepatic cysts measuring up to 14 mm, benign. Additional ancillary findings as above.  Electronically Signed   By: Julian Hy M.D.   On: 02/28/2022 20:16    Anti-infectives: Anti-infectives (From admission, onward)    Start     Dose/Rate Route Frequency Ordered Stop   02/28/22 0300  vancomycin (VANCOCIN) IVPB 1000 mg/200 mL premix        1,000 mg 200 mL/hr over 60 Minutes Intravenous  Once 02/28/22 0251 02/28/22 0448   02/28/22 0300  piperacillin-tazobactam (ZOSYN) IVPB 3.375 g        3.375 g 100 mL/hr over 30 Minutes Intravenous  Once 02/28/22 0251 02/28/22 0341   02/28/22 0000  amoxicillin-clavulanate (AUGMENTIN) 875-125 MG tablet        1 tablet Oral Every 12 hours 02/28/22 0022     02/27/22 2330  amoxicillin-clavulanate (AUGMENTIN) 875-125 MG per tablet 1 tablet        1 tablet Oral  Once 02/27/22 2318 02/28/22 0106       Assessment/Plan: Cholelithiasis  Hepatic cirrhosis - otherwise negative MRCP   No indications for surgical intervention Rectal Pain - Mesalamine enemas per GI   Poor overall surgical candidate    FEN - Renal, IVF per Neprho and TRH VTE - SCDs, hold Eliquis, okay for heparin GTT if needed ID - no antibiotics indicated by our team at this time No indications for urgent surgery.  Will sign off for now.  Call us back if a surgical issue arises.    ESRD on HD (MWF) HTN HLD CAD Prior tobacco abuse (quit 12/22) Etoh abuse (quit 12/22) with Cirrhosis PAF s/p DCCV in 1/23 on Eliquis OSA  CHFrEF (EF 25-30% - 10/31/21) on midodrine with severe mitral regurg (not candidate for Mitraclip per cards note 9/26) - CT w/ cardiac enlargement with probable edema in the lung bases. Aortic atherosclerosis with significant focal stenosis demonstrated in the right external iliac artery on CT  LOS: 1 day    Maia Petties 03/02/2022

## 2022-03-02 NOTE — Progress Notes (Signed)
[facility] Progress Note  Name: Ruben [name] MRN: [id number] DOB: 1945-11-09  Chief Complaint:  Rectal pain   Subjective:  Last HD on 10/28 with 0.6 kg UF.  He didn't sleep much and is resting now.  GI is doing additional testing.  He's still OBS status.  His son is at bedside and we discussed same.  He hasn't observed any shortness of breath or other acute issues   --------------- Background on consult:  Ruben [name] is a 76 y.o. male with a history including ESRD, HTN, CAD, tobacco abuse, and prior alcohol abuse who presented to the hospital with one week of rectal pain.  Note previously admitted in late September for GI bleed.  Note he quit drinking in 04/2021 per charting.  Nephrology is consulted for assistance with management of ESRD.  Spoke with his daughter at bedside; she supplements his history.  We discussed nephrology basics including dry weight.     Intake/Output Summary (Last 24 hours) at 03/02/2022 1116 Last data filed at 03/02/2022 0640 Gross per 24 hour  Intake 360 ml  Output 600 ml  Net -240 ml    Vitals:  Vitals:   03/01/22 2018 03/01/22 2121 03/02/22 0639 03/02/22 0900  BP:  95/60 100/64 (!) 91/56  Pulse:  80 77 77  Resp:  '18 18 18  '$ Temp:  (!) 97.5 F (36.4 C) 98.4 F (36.9 C) 97.8 F (36.6 C)  TempSrc:  Oral Oral Oral  SpO2:  100% 100%   Weight: 69.7 kg 68.4 kg    Height:  '5\' 10"'$  (1.778 m)       Physical Exam:   General: elderly male in bed in NAD at rest  HEENT: NCAT Eyes: EOMI sclera anicteric Neck: supple trachea midline  Heart: S1S2 no rub Lungs: clear and unlabored; room air  Abdomen: soft/nt/nd Extremities: no edema lower extremities; no cyanosis or clubbing  Psych no anxiety or agitation  Access: left AVG bruit and thrill   Medications reviewed   Labs:     Latest Ref Rng & Units 03/02/2022    3:27 AM 03/01/2022    2:03 AM 02/27/2022    8:47 PM  BMP  Glucose 70 - 99 mg/dL 108  114  99   BUN 8 -  23 mg/dL 23  41  34   Creatinine 0.61 - 1.24 mg/dL 5.79  8.72  6.57   Sodium 135 - 145 mmol/L 137  136  139   Potassium 3.5 - 5.1 mmol/L 4.0  4.9  3.9   Chloride 98 - 111 mmol/L 95  92  94   CO2 22 - 32 mmol/L '26  28  31   '$ Calcium 8.9 - 10.3 mg/dL 9.3  9.7  9.7    Outpatient HD orders:  MWF [facility]  3 hrs and 45 minutes BF 400/ DF 500 2K/2 Ca EDW 70 kg (Last tx 10/25 - 69.3 kg) - sometimes has hypotension with HD AV graft Meds:  Mircera 30 mcg every 2 weeks - last given 10/23  Assessment/Plan:   # Rectal pain  - felt 2/2 non-infectious prostatitis per charting - per primary team and GI - surgery has seen him and signed off.  Mesalamine enemas per GI   # ESRD - HD per MWF usually.  He will now resume his usual MWF schedule   # Chronic hypotension  - controlled on midodrine   # Anemia CKD - Hb acceptable at 9.4; ESA recently administered   #  Metabolic bone disease  - not on activated vit D outpatient   # Supratherapeutic INR - improving - per primary team    Disposition - per primary team.     Claudia Desanctis, MD 03/02/2022 11:31 AM

## 2022-03-02 NOTE — Consult Note (Addendum)
CARDIOLOGY CONSULT NOTE       Patient ID: Ruben [name] MRN: [id number] DOB/AGE: 1946/04/10 76 y.o.  Admit date: 02/27/2022 Referring Physician: Reesa Chew Primary Physician: [name], MD Primary Cardiologist: Aundra Dubin Reason for Consultation: Elevated LfTls/CHF/PAF  Principal Problem:   Prostatitis Active Problems:   HLD (hyperlipidemia)   ESRD on hemodialysis ([place])   Paroxysmal atrial fibrillation (HCC)   Chronic systolic CHF (congestive heart failure) (HCC)   Normocytic anemia   Transaminitis   CAD (coronary artery disease)   Anxiety and Depression   Mitral regurgitation   Cholelithiasis   Cirrhosis of liver without ascites ([place])   HPI:  76 y.o. history of HTN, HLD, ESRD, smoking, ETOH abuse, PAF and ischemic DCM with EF 35-40% Admitted for rectal pain with concern for anal fistula. Had nausea, vomiting and abdominal Pain. Recent GI bleed requiring transfusion with EGD 01/31/22 showing Barrett's and angiodysplastic lesion Rx with clip He has cirrhosis with ascites CT with multiple stones in GB He is on amiodarone for PAF with TEE/DCC done 05/13/21 and is on eliquis. MRCP negative for cholecystitis or choledocholithiasis  Total Bilirubin 3.2 with direct 1.5 He has had moderate/severe MR and turned down for mitral clip and EF 25-30% by TEE 10/31/21 Cath 05/09/21 with occluded RCA with left to right collaterals and diffuse LAD/LCX dx no targets for PCI and turned down for CABG. In hospital he has not had any chest pain , dyspnea and is not volume overloaded He has been in NSR CHADVASC score is 5  GDMT limited by hypotension with dialysis on midodrine Volume controlled with dialysis On beta blocker  ROS All other systems reviewed and negative except as noted above  Past Medical History:  Diagnosis Date   Arthritis    Chronic kidney disease, stage 3b ([place])    ESRD (end stage renal disease) ([place])    ETOH abuse    History of stress test 09/02/2008   showed inferolateral  scar without ischemia   Hx of echocardiogram 09/02/2008   was essentially normal   Hyperlipidemia    Hypertension    Myocardial infarction ([place])    Tobacco abuse     Family History  Problem Relation Age of Onset   Heart attack Brother    Diabetes Brother    Heart disease Brother    Cancer Father    Diabetes Brother    Heart disease Brother     Social History   Socioeconomic History   Marital status: Married    Spouse name: Not on file   Number of children: Not on file   Years of education: Not on file   Highest education level: Not on file  Occupational History   Not on file  Tobacco Use   Smoking status: Former    Packs/day: 1.50    Years: 20.00    Total pack years: 30.00    Types: Cigarettes    Quit date: 04/28/2021    Years since quitting: 0.8   Smokeless tobacco: Never   Tobacco comments:    down to 2 packs per week; 3/21 - down to 1 Pack per week  Vaping Use   Vaping Use: Never used  Substance and Sexual Activity   Alcohol use: No    Alcohol/week: 0.0 standard drinks of alcohol   Drug use: No   Sexual activity: Not on file  Other Topics Concern   Not on file  Social History Narrative   Caffiene- rare.    Education: HS  Retired: Administrator.    Right handed   Lives at home with wife and son   Social Determinants of Health   Financial Resource Strain: Medium Risk (05/02/2021)   Overall Financial Resource Strain (CARDIA)    Difficulty of Paying Living Expenses: Somewhat hard  Food Insecurity: No Food Insecurity (05/02/2021)   Hunger Vital Sign    Worried About Running Out of Food in the Last Year: Never true    Ran Out of Food in the Last Year: Never true  Transportation Needs: No Transportation Needs (05/02/2021)   PRAPARE - Hydrologist (Medical): No    Lack of Transportation (Non-Medical): No  Physical Activity: Not on file  Stress: Not on file  Social Connections: Not on file  Intimate Partner Violence: Not on  file    Past Surgical History:  Procedure Laterality Date   AV FISTULA PLACEMENT Left 05/27/2021   Procedure: LEFT ARTERIOVENOUS (AV) GRAFT CREATION;  Surgeon: Broadus John, MD;  Location: [place];  Service: Vascular;  Laterality: Left;  PERIPHERAL NERVE BLOCK   BACK SURGERY     Dr Luiz Ochoa involving L3-L4 discectomy.   BIOPSY  06/14/2021   Procedure: BIOPSY;  Surgeon: Carol Ada, MD;  Location: [facility] ENDOSCOPY;  Service: Endoscopy;;   CARDIOVERSION N/A 05/13/2021   Procedure: CARDIOVERSION;  Surgeon: Larey Dresser, MD;  Location: [facility] ENDOSCOPY;  Service: Cardiovascular;  Laterality: N/A;   ESOPHAGOGASTRODUODENOSCOPY (EGD) WITH PROPOFOL N/A 02/04/2022   Procedure: ESOPHAGOGASTRODUODENOSCOPY (EGD) WITH PROPOFOL;  Surgeon: Carol Ada, MD;  Location: [place];  Service: Gastroenterology;  Laterality: N/A;   FLEXIBLE SIGMOIDOSCOPY N/A 06/14/2021   Procedure: FLEXIBLE SIGMOIDOSCOPY;  Surgeon: Carol Ada, MD;  Location: [place];  Service: Endoscopy;  Laterality: N/A;   HEMOSTASIS CLIP PLACEMENT  02/04/2022   Procedure: HEMOSTASIS CLIP PLACEMENT;  Surgeon: Carol Ada, MD;  Location: [place];  Service: Gastroenterology;;   HERNIA REPAIR     HOT HEMOSTASIS N/A 02/04/2022   Procedure: HOT HEMOSTASIS (ARGON PLASMA COAGULATION/BICAP);  Surgeon: Carol Ada, MD;  Location: [place];  Service: Gastroenterology;  Laterality: N/A;   IR FLUORO GUIDE CV LINE RIGHT  05/21/2021   IR US GUIDE VASC ACCESS RIGHT  05/21/2021   RIGHT/LEFT HEART CATH AND CORONARY ANGIOGRAPHY N/A 05/09/2021   Procedure: RIGHT/LEFT HEART CATH AND CORONARY ANGIOGRAPHY;  Surgeon: Larey Dresser, MD;  Location: [place] [facility];  Service: Cardiovascular;  Laterality: N/A;   SPINE SURGERY     TEE WITHOUT CARDIOVERSION N/A 05/07/2021   Procedure: TRANSESOPHAGEAL ECHOCARDIOGRAM (TEE);  Surgeon: Larey Dresser, MD;  Location: [facility] ENDOSCOPY;  Service: Cardiovascular;  Laterality: N/A;   TEE WITHOUT CARDIOVERSION N/A  05/13/2021   Procedure: TRANSESOPHAGEAL ECHOCARDIOGRAM (TEE);  Surgeon: Larey Dresser, MD;  Location: [facility] ENDOSCOPY;  Service: Cardiovascular;  Laterality: N/A;   TEE WITHOUT CARDIOVERSION N/A 10/31/2021   Procedure: TRANSESOPHAGEAL ECHOCARDIOGRAM (TEE);  Surgeon: Larey Dresser, MD;  Location: [facility] ENDOSCOPY;  Service: Cardiovascular;  Laterality: N/A;      Current Facility-Administered Medications:    0.9 %  sodium chloride infusion, 250 mL, Intravenous, PRN, Orma Flaming, MD   acetaminophen (TYLENOL) tablet 650 mg, 650 mg, Oral, Q6H PRN **OR** acetaminophen (TYLENOL) suppository 650 mg, 650 mg, Rectal, Q6H PRN, Orma Flaming, MD   amiodarone (PACERONE) tablet 200 mg, 200 mg, Oral, Daily, Orma Flaming, MD, 200 mg at 03/01/22 1207   gabapentin (NEURONTIN) capsule 100 mg, 100 mg, Oral, QHS, Orma Flaming, MD, 100 mg at 03/01/22 2212  guaiFENesin (ROBITUSSIN) 100 MG/5ML liquid 5 mL, 5 mL, Oral, Q4H PRN, Amin, Ankit Chirag, MD   hydrALAZINE (APRESOLINE) injection 10 mg, 10 mg, Intravenous, Q4H PRN, Amin, Ankit Chirag, MD   ipratropium-albuterol (DUONEB) 0.5-2.5 (3) MG/3ML nebulizer solution 3 mL, 3 mL, Nebulization, Q4H PRN, Amin, Ankit Chirag, MD   LORazepam (ATIVAN) tablet 0.5 mg, 0.5 mg, Oral, Daily PRN, Orma Flaming, MD   mesalamine (ROWASA) enema 4 g, 4 g, Rectal, QHS, Carol Ada, MD, 4 g at 03/01/22 2347   metoprolol tartrate (LOPRESSOR) injection 5 mg, 5 mg, Intravenous, Q4H PRN, Amin, Ankit Chirag, MD   midodrine (PROAMATINE) tablet 15 mg, 15 mg, Oral, TID WC, Orma Flaming, MD, 15 mg at 03/02/22 0835   ondansetron (ZOFRAN) tablet 4 mg, 4 mg, Oral, Q6H PRN **OR** ondansetron (ZOFRAN) injection 4 mg, 4 mg, Intravenous, Q6H PRN, Orma Flaming, MD   pantoprazole (PROTONIX) EC tablet 40 mg, 40 mg, Oral, BID, Orma Flaming, MD, 40 mg at 03/02/22 0836   senna-docusate (Senokot-S) tablet 1 tablet, 1 tablet, Oral, QHS PRN, Amin, Ankit Chirag, MD   sertraline (ZOLOFT) tablet 25 mg,  25 mg, Oral, q AM, Orma Flaming, MD, 25 mg at 03/02/22 0836   sodium chloride flush (NS) 0.9 % injection 3 mL, 3 mL, Intravenous, Q12H, Orma Flaming, MD, 3 mL at 03/01/22 2213   sodium chloride flush (NS) 0.9 % injection 3 mL, 3 mL, Intravenous, PRN, Orma Flaming, MD   traZODone (DESYREL) tablet 50 mg, 50 mg, Oral, QHS PRN, Damita Lack, MD, 50 mg at 03/02/22 0253  amiodarone  200 mg Oral Daily   gabapentin  100 mg Oral QHS   mesalamine  4 g Rectal QHS   midodrine  15 mg Oral TID WC   pantoprazole  40 mg Oral BID   sertraline  25 mg Oral q AM   sodium chloride flush  3 mL Intravenous Q12H    sodium chloride      Physical Exam: Blood pressure 100/64, pulse 77, temperature 98.4 F (36.9 C), temperature source Oral, resp. rate 18, height '5\' 10"'$  (1.778 m), weight 68.4 kg, SpO2 100 %.    Chronically ill black male JVP elevated Lungs clear Abdomen non tender No edema Apical MR murmur PMI enlarged  Fistula LUE for dialysis   Labs:   Lab Results  Component Value Date   WBC 7.2 03/02/2022   HGB 9.4 (L) 03/02/2022   HCT 28.3 (L) 03/02/2022   MCV 95.0 03/02/2022   PLT 82 (L) 03/02/2022    Recent Labs  Lab 03/02/22 0327  NA 137  K 4.0  CL 95*  CO2 26  BUN 23  CREATININE 5.79*  CALCIUM 9.3  PROT 6.0*  BILITOT 3.6*  ALKPHOS 110  ALT 177*  AST 295*  GLUCOSE 108*   Lab Results  Component Value Date   CKTOTAL 31 (L) 07/23/2021    Lab Results  Component Value Date   CHOL 119 01/28/2022   CHOL 129 07/23/2021   CHOL 160 04/30/2021   Lab Results  Component Value Date   HDL 36 (L) 01/28/2022   HDL 37 (L) 07/23/2021   HDL 59 04/30/2021   Lab Results  Component Value Date   LDLCALC 70 01/28/2022   LDLCALC 76 07/23/2021   LDLCALC 93 04/30/2021   Lab Results  Component Value Date   TRIG 65 01/28/2022   TRIG 82 07/23/2021   TRIG 41 04/30/2021   Lab Results  Component Value Date   CHOLHDL 3.3  01/28/2022   CHOLHDL 3.5 07/23/2021   CHOLHDL 2.7  04/30/2021   No results found for: "LDLDIRECT"    Radiology: MR ABDOMEN MRCP WO CONTRAST  Result Date: 02/28/2022 CLINICAL DATA:  Cholelithiasis EXAM: MRI ABDOMEN WITHOUT CONTRAST  (INCLUDING MRCP) TECHNIQUE: Multiplanar multisequence MR imaging of the abdomen was performed. Heavily T2-weighted images of the biliary and pancreatic ducts were obtained, and three-dimensional MRCP images were rendered by post processing. COMPARISON:  Right upper quadrant ultrasound dated 02/28/2022. CT abdomen/pelvis dated 02/27/2022. FINDINGS: Motion degraded images. Lower chest: Small bilateral pleural effusions. Hepatobiliary: Mildly nodular hepatic contour, suggesting cirrhosis. Moderate hepatic steatosis. Scattered probable hepatic cysts, measuring up to 14 mm in segment 4A (series 13/image 15). Contracted gallbladder with cholelithiasis. No associated inflammatory changes to suggest acute cholecystitis. No intrahepatic or extrahepatic ductal dilatation. Common duct measures 4 mm. No choledocholithiasis is seen. Pancreas:  Within normal limits. Spleen:  Within normal limits. Adrenals/Urinary Tract:  Adrenal glands are within normal limits. Kidneys are notable for multiple simple bilateral renal cysts, measuring up to 2.0 cm in the right upper kidney (series 4/image 17), benign (Bosniak I). Additional bilateral hemorrhagic lesions, with intrinsic T1 hyperintensity and intermediate/dark on T2, measuring up to 13 mm in the medial interpolar right kidney (series 4/image 26) and 17 mm in the anterior interpolar left kidney (series 4/image 27), benign (Bosniak II). No follow-up is recommended. No hydronephrosis. Stomach/Bowel: Stomach is within normal limits. Susceptibility artifact in the second portion of the duodenum, related to the patient's hemostasis clip from recent EGD. Visualized bowel is otherwise grossly unremarkable. Vascular/Lymphatic:  No evidence of abdominal aortic aneurysm. No suspicious abdominal  lymphadenopathy. Other:  Moderate upper abdominal ascites. Musculoskeletal: No focal osseous lesions. IMPRESSION: Motion degraded images. Cholelithiasis, without associated findings to suggest acute cholecystitis. No intrahepatic or extrahepatic ductal dilatation. Common duct measures 4 mm. No choledocholithiasis is seen. Suspected cirrhosis. Moderate hepatic steatosis. Scattered hepatic cysts measuring up to 14 mm, benign. Additional ancillary findings as above. Electronically Signed   By: Julian Hy M.D.   On: 02/28/2022 20:16   US Abdomen Limited RUQ (LIVER/GB)  Result Date: 02/28/2022 CLINICAL DATA:  History of cholelithiasis with right upper quadrant pain, initial encounter EXAM: ULTRASOUND ABDOMEN LIMITED RIGHT UPPER QUADRANT COMPARISON:  CT from the previous day. FINDINGS: Gallbladder: Gallbladder is decompressed with wall echo shadow sign consistent with cholelithiasis. This is similar to that seen on recent CT examination. Wall is at the upper limits of normal. No pericholecystic fluid is seen. Negative sonographic Murphy's sign is noted. Common bile duct: Diameter: 4.3 mm. Liver: Increased in echogenicity consistent with fatty liver. Portal vein is patent on color Doppler imaging with normal direction of blood flow towards the liver. Other: Mild ascites is seen. IMPRESSION: Cholelithiasis in a contracted gallbladder Ascites and fatty infiltration of the liver. Electronically Signed   By: Inez Catalina M.D.   On: 02/28/2022 01:14   CT ABDOMEN PELVIS W CONTRAST  Result Date: 02/27/2022 CLINICAL DATA:  Abdominal pain, acute nonlocalized. Concern for rectal abscess. EXAM: CT ABDOMEN AND PELVIS WITH CONTRAST TECHNIQUE: Multidetector CT imaging of the abdomen and pelvis was performed using the standard protocol following bolus administration of intravenous contrast. RADIATION DOSE REDUCTION: This exam was performed according to the departmental dose-optimization program which includes automated  exposure control, adjustment of the mA and/or kV according to patient size and/or use of iterative reconstruction technique. CONTRAST:  116m OMNIPAQUE IOHEXOL 300 MG/ML  SOLN COMPARISON:  11/29/2021 FINDINGS: Lower chest: Small bilateral pleural effusions  with basilar atelectasis, greater on the left. Hazy interstitial pattern to the lung bases likely representing edema. Cardiac enlargement. Coronary artery calcifications. Hepatobiliary: Subcentimeter low-attenuation lesions in the liver are too small to characterize but likely represent small cysts or hemangiomas. No change since prior study. No imaging follow-up is indicated. Probable hepatic cirrhosis with enlarged lateral segment of the left lobe of the liver. Cholelithiasis with multiple stones filling the gallbladder. Gallbladder is contracted. No bile duct dilatation. Pancreas: Unremarkable. No pancreatic ductal dilatation or surrounding inflammatory changes. Spleen: Normal in size without focal abnormality. Adrenals/Urinary Tract: No adrenal gland nodules. Renal nephrograms are delayed consistent with history of renal insufficiency. Multiple bilateral renal cysts, largest on the right measuring 2.2 cm diameter. No imaging follow-up is indicated. No hydronephrosis or hydroureter. Bladder is normal. Stomach/Bowel: Stomach, small bowel, and colon are not abnormally distended. Mild scattered hyperdense material likely representing ingested material. Diverticulosis of the sigmoid colon without evidence of acute diverticulitis. Appendix is normal. The anorectal wall appears diffusely thickened without pouching of the lumen over the thickened segment. This could represent proctitis or rectal mass. Direct visualization is suggested. Vascular/Lymphatic: Diffuse aortic calcification. Scattered mural thrombus. No aneurysm or dissection. Focal stenosis of the right external iliac artery likely representing about 70% diameter reduction. No significant lymphadenopathy.  Reproductive: Prostate gland is unremarkable. Other: Moderate diffuse abdominal and pelvic free fluid, likely ascites. Edema throughout the subcutaneous soft tissues. No loculated collections. No free air. Musculoskeletal: Degenerative changes. No destructive bone lesions. Mild diffuse bone sclerosis may represent renal osteodystrophy. Curvilinear sclerosis in the right femoral head suggesting avascular necrosis. IMPRESSION: 1. No perirectal abscess is identified but there is diffuse wall thickening of the low rectum just above the anus. This could represent proctitis or rectal mass. Direct visualization is suggested. 2. Hepatic cirrhosis. 3. Cholelithiasis with multiple stones filling the gallbladder. 4. Small bilateral pleural effusions. Diffuse abdominal and pelvic ascites. Diffuse soft tissue edema. 5. Aortic atherosclerosis with significant focal stenosis demonstrated in the right external iliac artery. 6. Cardiac enlargement with probable edema in the lung bases. Electronically Signed   By: Lucienne Capers M.D.   On: 02/27/2022 22:33   Nocturnal polysomnography  Result Date: 02/19/2022 [name], MD     02/24/2022  5:11 PM Physician Interpretation:  [facility] POLYSOMNOGRAPHY  INTERPRETATION REPORT STUDY DATE:  02/19/2022  PATIENT NAME:  Ruben [name]        DATE OF BIRTH:  10-27-1945 PATIENT ID:  [id number]    TYPE OF STUDY:  PSG READING PHYSICIAN: [name], MD, [name] REFERRED BY: Dr. Collene Leyden SCORING TECHNICIAN: Gaylyn Cheers, RPSGT HISTORY: 76 year old male with an underlying complex medical history of end-stage renal disease, on hemodialysis, history of alcohol use disorder by chart review, hypertension, hyperlipidemia, coronary artery disease with history of MI, A-fib, CHF, chronic respiratory failure with hypoxia, arthritis, and prior?smoking, who reports intermittent trembling affecting his hands and feet.?He does not sleep very well, he snores, he  has never had a sleep study, he is on supplemental oxygen at night at 2 L/min per cardiology. Height: 71 in Weight: 154 lb (BMI 21) Neck Size: 0  MEDICATIONS: Tylenol, Proventil, Xanax, Pacerone, Eliquis, Lipitor, Zyrtec, Vitamin D3, Valium, Neurontin, Anusol-HC, Atarax, Emla, Mag-Ox, Reglan, Proamatine, Nitrostat, Prilosec, Zofran, Preperation H, Miralax, Compazine, Zoloft, Multivitamins, Remeron TECHNICAL DESCRIPTION: A registered sleep technologist was in attendance for the duration of the recording.  Data collection, scoring, video monitoring, and reporting were performed in compliance with the AASM  Manual for the Scoring of Sleep and Associated Events; (Hypopnea is scored based on the criteria listed in Section VIII D. 1b in the AASM Manual V2.6 using a 4% oxygen desaturation rule or Hypopnea is scored based on the criteria listed in Section VIII D. 1a in the AASM Manual V2.6 using 3% oxygen desaturation and /or arousal rule). SLEEP CONTINUITY AND SLEEP ARCHITECTURE:  Lights-out was at 22:13: and lights-on at  04:37:, with a total recording time of 6 hours, 24 min . Total sleep time ( TST) was 54.5 minutes with a markedly low sleep efficiency of 14.2%.  BODY POSITION:  TST was divided  between the following sleep positions: 22.9% supine;  72.5% lateral;  0% prone. Duration of total sleep and percent of total sleep in their respective position is as follows: supine 12 minutes (23%), non-supine 42 minutes (77%); right 16 minutes (29%), left 23 minutes (43%), and prone 00 minutes (0%). Total supine REM sleep time was 00 minutes (0% of total REM sleep). Sleep latency was normal at 18.5 minutes.  REM sleep latency was markedly delayed at 363.5 minutes. Of the total sleep time, the percentage of stage N1 sleep was 72.5%, which is markedly increased, stage N2 sleep was 26%, which is reduced, stage N3 sleep was absent, and REM sleep was nearly absent at 1.8%. Wake after sleep onset (WASO) time accounted for 311 minutes  with poor sleep consolidation. RESPIRATORY MONITORING:  Based on CMS criteria (using a 4% oxygen desaturation rule for scoring hypopneas), there were 8 apneas (5 obstructive; 1 central; 2 mixed), and 20 hypopneas.  Apnea index was 8.8. Hypopnea index was 22.0. The apnea-hypopnea index was 30.8/hour overall (48.0 supine, 0 non-supine; 0.0 REM, 0.0 supine REM).  There were 0 respiratory effort-related arousals (RERAs).  The RERA index was 0 events/h. Total respiratory disturbance index (RDI) was 30.8 events/h. RDI results showed: supine RDI  48.0 /h; non-supine RDI 25.7 /h; REM RDI 0.0 /h, supine REM RDI 0.0 /h. Based on AASM criteria (using a 3% oxygen desaturation and /or arousal rule for scoring hypopneas), there were 8 apneas (5 obstructive; 1 central; 2 mixed), and 21 hypopneas. Apnea index was 8.8. Hypopnea index was 23.1. The apnea-hypopnea index was 31.9 overall (48.0 supine, 0 non-supine; 0.0 REM, 0.0 supine REM).  There were 0 respiratory effort-related arousals (RERAs).  The RERA index was 0 events/h. Total respiratory disturbance index (RDI) was 31.9 events/h. RDI results showed: supine RDI  48.0 /h; non-supine RDI 27.1 /h; REM RDI 0.0 /h, supine REM RDI 0.0 /h. OXIMETRY: Oxyhemoglobin Saturation Nadir during sleep was at  66%) from a mean of 91%. The study was started without supplemental oxygen. Of the Total sleep time (TST)   hypoxemia (=<88%) was present for  10.2 minutes, or 18.7% of total sleep time. Supplemental oxygen was started at 1 L/min at 3:14 AM. His baseline O2 saturation improved mildly. LIMB MOVEMENTS: There were 62 periodic limb movements of sleep (68.3/hr), of which 7 (7.7/hr) were associated with an arousal. AROUSAL: There were 38 arousals in total, for an arousal index of 42 arousals/hour.  Of these, 11 were identified as respiratory-related arousals (12 /h), 7 were PLM-related arousals (8 /h), and 30 were non-specific arousals (33 /h). EEG: Review of the EEG showed no abnormal  electrical discharges and symmetrical bihemispheric findings.  EKG: The EKG revealed normal sinus rhythm (NSR) with . The average heart rate during sleep was 91 bpm. Intermittent PVCs (premature ventricular contractions) were noted. AUDIO/VIDEO REVIEW: The audio  and video review did not show any abnormal or unusual behaviors, movements, phonations or vocalizations. The patient was very restless. The patient took 1 restroom break. Snoring was intermittent and mild. POST-STUDY QUESTIONNAIRE: Post study, the patient indicated, that sleep was the same as usual. The patient reported to the sleep technician that he does have frequent nights similar to this, especially after having his dialysis. IMPRESSION: 1. Poor sleep pattern 2. Obstructive sleep apnea (OSA) 3. Dysfunctions associated with sleep stages or arousal from sleep 4. PLMD (periodic limb movement disorder) 5. Non-specific abnormal electrocardiogram (EKG) RECOMMENDATIONS: 1. This study was very limited secondary to poor sleep efficiency, and poor sleep consolidation.  During the limited amount of total sleep time of less than 1 hour, there was evidence of obstructive sleep disordered breathing.  I would recommend a trial of AutoPap therapy without supplemental oxygen. Oxygen can be added if need be later on, I would recommend a pulse oximetry test while on AutoPap therapy, once patient is comfortable using AutoPAP. Given the patient's complex medical history and sleep related complaints, treatment with positive airway pressure is recommended. Please note, that untreated obstructive sleep apnea may carry additional perioperative morbidity. Patients with significant obstructive sleep apnea should receive perioperative PAP therapy and the surgeons and particularly the anesthesiologist should be informed of the diagnosis and the severity of the sleep disordered breathing. 2. Severe PLMs (periodic limb movements of sleep) were noted during this study with mild  arousals; clinical correlation is recommended.  PLMS may improve with PAP therapy.  Medication effect from the antidepressant medication should be considered. 3. The study showed occasional PVCs on single lead EKG; clinical correlation is recommended. The patient is well-known to cardiology. 4. This study shows significant sleep fragmentation and abnormal sleep stage percentages; these are nonspecific findings and per se do not signify an intrinsic sleep disorder or a cause for the patient's sleep-related symptoms. Causes include (but are not limited to) the first night effect of the sleep study, circadian rhythm disturbances, medication effect or an underlying mood disorder or medical problem. 5. The patient should be cautioned not to drive, work at heights, or operate dangerous or heavy equipment when tired or sleepy. Review and reiteration of good sleep hygiene measures should be pursued with any patient. 6. The patient will be seen in follow-up by Dr. Rexene Alberts at [facility] for discussion of the test results and further management strategies. The referring provider will be notified of the test results. I certify that I have reviewed the entire raw data recording prior to the issuance of this report in accordance with the Standards of Accreditation of the American Academy of Sleep Medicine (AASM). [name], MD, [name] [facility]) [place], ABPN (Neurology and Sleep)   Technical Report: General Information Name: Angelino, Rumery. BMI: 21.48 Physician: [name], MD ID: [id number] Height: 71.0 in Technician: Gaylyn Cheers, RPSGT Sex: Male Weight: 154.0 lb Record: [id number] Age: 25 [02-12-1946] Date: 02/19/2022   Medical & Medication History   Mr. Sem is a 76 year old right-handed gentleman with an underlying complex medical history of end-stage renal disease, on hemodialysis, history of alcohol use disorder by chart review, hypertension, hyperlipidemia, coronary artery disease with  history of MI, A-fib, CHF, chronic respiratory failure with hypoxia, arthritis, and prior smoking, who reports intermittent trembling affecting his hands and feet. Symptoms started after his heart attack in December 2022. He has had more stress he admits. His primary care physician, Dr. Doristine Bosworth started him recently on sertraline 25 mg  strength once daily. He has been on it for about 2 months. He has had several different medication trials. Gabapentin 100 mg at bedtime did not help his tremors. He does have trouble sleeping. He has a prescription for Xanax and Valium but these are not currently active and were only prescribed for a procedure or as needed use before dialysis. He does have a prescription for mirtazapine, 15 mg as well as 30 mg, he currently does not take any of these. He has a prescription for Reglan which he uses infrequently. He may go to bed around 10 PM but may not be asleep until the early morning hours. Rise time varies. He can drink up to 2 L of water per day but does not drink that much. He does not drink any caffeine, has stopped drinking alcohol in December 2022 and does admit to having history of alcohol abuse. He quit smoking also in December 2022. He lives with his wife. He is the oldest of 10 children, none of his siblings has a tremor. He does not recall his parents having a tremor. None of his kids have a tremor, he has 3 boys and 1 daughter. He does not sleep very well, he snores, he has never had a sleep study, he is on supplemental oxygen at night at 2 L/min per cardiology. He has been on hemodialysis since February or March 2023. He does not have any nocturia, does not actually have much in the way of urine production any longer. Tylenol, Proventil, Xanax, Pacerone, Eliquis, Lipitor, Zyrtec, Vitamin D3, Valium, Neurontin, Anusol-HC, Atarax, Emla, Mag-Ox, Reglan, Proamatine, Nitrostat, Prilosec, Zofran, Preperation H, Miralax, Compazine, Zoloft, Multivitamins, Remeron  Sleep Disorder     Comments  Patient arrived for a diagnostic polysomnogram. Procedure explained and all questions answered. Standard paste setup without complications. Patient quite restless and had significantly reduced sleep efficiency and delay to persistent sleep. A lot of micro sleep. Patient reports having frequent nights like this, especially after having his dialysis appointments. Patient attempted to sleep supine, right, and left. Occasional mild audible snoring was heard. Respiratory events observed. 1/LPM oxygen was added at 03:14 to raise his SAO2 baseline. Occasional PVC's observed. Patient has a known cardiac history. No significant PLMS observed. One restroom visit.  Lights out: 10:13:05 PM Lights on: 04:37:09 AM Time Total Supine Side Prone Upright Recording (TRT) 6h 24.67m1h 44.589mh 48.23m56m 0.31m 48m51.31m S69mp (TST) 0h 54.23m 0h58m.23m 0h 86m23m 0h 036m 0h 2.723mLatenc52m1 N2 N3 REM Onset Per. Slp. Eff. Actual 0h 0.31m 0h 9.23m54m 0.31m 723m3.23m 0523m8.23m 054m.31m 14.823m Stg D17mWake N1 N2 N3 REM Total 329.5 39.5 14.0 0.0 1.0 Supine 92.0 12.5 0.0 0.0 0.0 Side 189.0 24.5 14.0 0.0 1.0 Prone 0.0 0.0 0.0 0.0 0.0 Upright 48.5 2.5 0.0 0.0 0.0  Stg % Wake N1 N2 N3 REM Total 85.8 72.5 25.7 0.0 1.8 Supine 24.0 22.9 0.0 0.0 0.0 Side 49.2 45.0 25.7 0.0 1.8 Prone 0.0 0.0 0.0 0.0 0.0 Upright 12.6 4.6 0.0 0.0 0.0  Apnea Summary Sub Supine Side Prone Upright Total 8 Total '8 4 4 '$ 0 0   REM 0 0 0 0 0   NREM '8 4 4 '$ 0 0 Obs 5 REM 0 0 0 0 0   NREM '5 4 1 '$ 0 0 Mix 2 REM 0 0 0 0 0   NREM 2 0 2 0 0 Cen 1 REM 0 0 0 0 0   NREM 1  0 1 0 0 Rera Summary Sub Supine Side Prone Upright Total 0 Total 0 0 0 0 0   REM 0 0 0 0 0   NREM 0 0 0 0 0  Hypopnea Summary Sub Supine Side Prone Upright Total 21 Total '21 6 13 '$ 0 2   REM 0 0 0 0 0   NREM '21 6 13 '$ 0 2 4% Hypopnea Summary Sub Supine Side Prone Upright Total (4%) 20 Total '20 6 12 '$ 0 2   REM 0 0 0 0 0   NREM '20 6 12 '$ 0 2  AHI Total Obs Mix Cen 31.93 Apnea 8.81 5.50 2.20 1.10  Hypopnea 23.12 -- -- -- 30.83 Hypopnea  (4%) 22.02 -- -- --  Total Supine Side Prone Upright Position AHI 31.93 48.00 25.82 0.00 48.00 REM AHI 0.00  NREM AHI 32.52  Position RDI 31.93 48.00 25.82 0.00 48.00 REM RDI 0.00  NREM RDI 32.52  4% Hypopnea Total Supine Side Prone Upright Position AHI (4%) 30.83 48.00 24.30 0.00 48.00 REM AHI (4%) 0.00  NREM AHI (4%) 31.40  Position RDI (4%) 30.83 48.00 24.30 0.00 48.00 REM RDI (4%) 0.00  NREM RDI (4%) 31.40  Desaturation Information Threshold: 2% <100% <90% <80% <70% <60% <50% <40% Supine 111.0 67.0 1.0 0.0 0.0 0.0 0.0 Side 256.0 176.0 2.0 0.0 0.0 0.0 0.0 Prone 0.0 0.0 0.0 0.0 0.0 0.0 0.0 Upright 68.0 53.0 1.0 0.0 0.0 0.0 0.0 Total 435.0 296.0 4.0 0.0 0.0 0.0 0.0 Index 71.6 48.7 0.7 0.0 0.0 0.0 0.0 Threshold: 3% <100% <90% <80% <70% <60% <50% <40% Supine 101.0 66.0 1.0 0.0 0.0 0.0 0.0 Side 233.0 173.0 2.0 0.0 0.0 0.0 0.0 Prone 0.0 0.0 0.0 0.0 0.0 0.0 0.0 Upright 67.0 53.0 1.0 0.0 0.0 0.0 0.0 Total 401.0 292.0 4.0 0.0 0.0 0.0 0.0 Index 66.0 48.1 0.7 0.0 0.0 0.0 0.0 Threshold: 4% <100% <90% <80% <70% <60% <50% <40% Supine 91.0 65.0 1.0 0.0 0.0 0.0 0.0 Side 218.0 168.0 2.0 0.0 0.0 0.0 0.0 Prone 0.0 0.0 0.0 0.0 0.0 0.0 0.0 Upright 66.0 53.0 1.0 0.0 0.0 0.0 0.0 Total 375.0 286.0 4.0 0.0 0.0 0.0 0.0 Index 61.7 47.1 0.7 0.0 0.0 0.0 0.0 Threshold: 3% <100% <90% <80% <70% <60% <50% <40% Supine 101 66 1 0 0 0 0 Side 233 173 2 0 0 0 0 Prone 0 0 0 0 0 0 0 Upright 67 53 1 0 0 0 0 Total 401 292 4 0 0 0 0  Awakening/Arousal Information # of Awakenings 30 Wake after sleep onset 311.1mWake after persistent sleep 0.022mrousal Assoc. Arousals Index Apneas 2 2.2 Hypopneas 9 9.9 Leg Movements 13 14.3 Snore 0 0.0 PTT Arousals 0 0.0 Spontaneous 32 35.2 Total 56 61.7 Leg Movement Information PLMS LMs Index Total LMs during PLMS 62 68.3 LMs w/ Microarousals 7 7.7 LM LMs Index w/ Microarousal 5 5.5 w/ Awakening 3 3.3 w/ Resp Event 0 0.0 Spontaneous 8 8.8 Total 13 14.3  Desaturation threshold setting: 3% Minimum desaturation setting: 10  seconds SaO2 nadir: 64% The longest event was a 26 sec obstructive Hypopnea with a minimum SaO2 of 87%. The lowest SaO2 was 79% associated with a 11 sec obstructive Hypopnea. EKG Rates EKG Avg Max Min Awake 91 128 76 Asleep 91 132 77 EKG Events: Tachycardia    EKG: SR nonspecific ST changes    ASSESSMENT AND PLAN:  PAF in NSR with DCGi Or Normann January Ok to hold eliquis given elevated INR of 3.7 I  suspect synthetic dysfunction and elevation from primarily liver issues and cirrhosis as he is not in decompensated CHF and DOAC;s ususally don't elevated INR that much CHADVASC 5 Not ideal candidate for Watchman given co morbidities Also ok to hold amiodarone although again this usually does not cause rise in bilirubin and will take weeks to get out of his system Ischemic DCM:  EF 25-30% Looks euvolemic but BNP 4500 02/28/22 Likely from MR and atrial enlargement Continue home diuretics May benefit from right heart cath given LFT/INR abnormalities and GI wondering about etiology CAD:  Not revascularized no targets for PCI turned down by CVTS with collateralized RCA and diffuse LCX/LAD dx. Continue medical Rx no angina MR:  severe ischemic turned down for clip   GI Bleed with angiodysplastic lesion post clipping Hct 30.5 Had transfusion 02/06/22  CRF Cr 5.8 LUE fistula per nephrology On midodrine to support BP  Signed: Jenkins Rouge 03/02/2022, 8:42 AM

## 2022-03-02 NOTE — Progress Notes (Signed)
   Patient Name: Marcha Dutton Date of Encounter: 03/02/2022, 11:04 AM    Subjective  No new events.  Dr. Johnsie Cancel has seen the patient for cardiology evaluation and thinks the pattern of liver test abnormality is not consistent with amiodarone toxicity.  Patient's son reports very poor appetite over time.  Also reports patient has less rectal pain   Objective  BP (!) 91/56 (BP Location: Right Arm)   Pulse 77   Temp 97.8 F (36.6 C) (Oral)   Resp 18   Ht '5\' 10"'$  (1.778 m)   Wt 68.4 kg   SpO2 100%   BMI 21.62 kg/m  Frail elderly white man resting in bed not awakened   Recent Labs  Lab 02/27/22 2047 02/28/22 1814 03/01/22 0203 03/02/22 0327  AST 231*  --  293* 295*  ALT 126*  --  160* 177*  ALKPHOS 115  --  111 110  BILITOT 3.0* 3.2* 3.1* 3.6*  PROT 6.9  --  6.2* 6.0*  ALBUMIN 3.7  --  2.8* 2.7*  INR  --  5.2* 4.6* 3.7*    Lab Results  Component Value Date   INR 3.7 (H) 03/02/2022   INR 4.6 (HH) 03/01/2022   INR 5.2 (HH) 02/28/2022   Recent Labs  Lab 02/27/22 2047 03/01/22 0203 03/02/22 0327  NA 139 136 137  K 3.9 4.9 4.0  CL 94* 92* 95*  CO2 '31 28 26  '$ GLUCOSE 99 114* 108*  BUN 34* 41* 23  CREATININE 6.57* 8.72* 5.79*  CALCIUM 9.7 9.7 9.3  MG  --   --  2.4   MRI MRCP 02/28/2022 IMPRESSION: Motion degraded images.   Cholelithiasis, without associated findings to suggest acute cholecystitis. No intrahepatic or extrahepatic ductal dilatation. Common duct measures 4 mm. No choledocholithiasis is seen.   Suspected cirrhosis. Moderate hepatic steatosis. Scattered hepatic cysts measuring up to 14 mm, benign    Assessment and Plan  Suspected proctitis - improved pain Cirrhosis and steatosis w/ worsening LFT's Ascites - liver disease vs cardiac or both as cause Elevated INR  Gallstones-not thought to be implicated in abnormal LFTs based upon all imaging studies  (MRCP)   --------------------------------------------------------------------------------------------------------------------------------------- Continue mesalamine for suspected proctitis   Liver issues remain unclear.  Additional serologic work-up ordered.  Cardiologist does not think pattern of problems typical for amiodarone.  Says typically do not see elevated bilirubin.  Okay to discontinue it though it takes a long time to clear.  He had abnormal thyroid studies with an elevated TSH but also an increased free T4 in September and sometimes amiodarone causes these problems as well.  I have ordered recheck of thyroid studies (in pursuit of evidence to support amiodarone toxicity), anti-smooth muscle antibody, ANA and an alpha-1 antitrypsin level.  Iron studies were ordered as well.  Discontinue amiodarone.  Depending upon clinical course he could need a liver biopsy though INR is problematic with respect to that at this time (transjugular may be needed).  Continue to follow INR.  Eliquis can make it abnormal though not typically as high as it was.  End-stage renal disease could affect the metabolism and clearance of Eliquis effect.   Vitamin K supplementation is reasonable and we will add that.  Though INR 3.7 likely okay for paracentesis will wait to order that-Dr. Benson Norway returns to see the patient tomorrow.  Gatha Mayer, MD, [facility] See Shea Evans on call - gastroenterology for best contact person 03/02/2022 11:04 AM

## 2022-03-03 ENCOUNTER — Ambulatory Visit (HOSPITAL_COMMUNITY): Payer: Medicare Other

## 2022-03-03 DIAGNOSIS — Z992 Dependence on renal dialysis: Secondary | ICD-10-CM

## 2022-03-03 DIAGNOSIS — K7460 Unspecified cirrhosis of liver: Secondary | ICD-10-CM | POA: Diagnosis not present

## 2022-03-03 DIAGNOSIS — I5022 Chronic systolic (congestive) heart failure: Secondary | ICD-10-CM | POA: Diagnosis not present

## 2022-03-03 DIAGNOSIS — N186 End stage renal disease: Secondary | ICD-10-CM

## 2022-03-03 DIAGNOSIS — K8020 Calculus of gallbladder without cholecystitis without obstruction: Secondary | ICD-10-CM | POA: Diagnosis not present

## 2022-03-03 DIAGNOSIS — N410 Acute prostatitis: Secondary | ICD-10-CM | POA: Diagnosis not present

## 2022-03-03 DIAGNOSIS — R188 Other ascites: Secondary | ICD-10-CM

## 2022-03-03 DIAGNOSIS — F064 Anxiety disorder due to known physiological condition: Secondary | ICD-10-CM | POA: Diagnosis not present

## 2022-03-03 LAB — COMPREHENSIVE METABOLIC PANEL
ALT: 145 U/L — ABNORMAL HIGH (ref 0–44)
AST: 234 U/L — ABNORMAL HIGH (ref 15–41)
Albumin: 2.5 g/dL — ABNORMAL LOW (ref 3.5–5.0)
Alkaline Phosphatase: 100 U/L (ref 38–126)
Anion gap: 18 — ABNORMAL HIGH (ref 5–15)
BUN: 36 mg/dL — ABNORMAL HIGH (ref 8–23)
CO2: 24 mmol/L (ref 22–32)
Calcium: 9.3 mg/dL (ref 8.9–10.3)
Chloride: 96 mmol/L — ABNORMAL LOW (ref 98–111)
Creatinine, Ser: 7.05 mg/dL — ABNORMAL HIGH (ref 0.61–1.24)
GFR, Estimated: 7 mL/min — ABNORMAL LOW (ref >60)
Glucose, Bld: 108 mg/dL — ABNORMAL HIGH (ref 70–99)
Potassium: 4.1 mmol/L (ref 3.5–5.1)
Sodium: 138 mmol/L (ref 135–145)
Total Bilirubin: 2.6 mg/dL — ABNORMAL HIGH (ref 0.3–1.2)
Total Protein: 5.4 g/dL — ABNORMAL LOW (ref 6.5–8.1)

## 2022-03-03 LAB — RENAL FUNCTION PANEL
Albumin: 2.7 g/dL — ABNORMAL LOW (ref 3.5–5.0)
Anion gap: 21 — ABNORMAL HIGH (ref 5–15)
BUN: 37 mg/dL — ABNORMAL HIGH (ref 8–23)
CO2: 21 mmol/L — ABNORMAL LOW (ref 22–32)
Calcium: 9.4 mg/dL (ref 8.9–10.3)
Chloride: 100 mmol/L (ref 98–111)
Creatinine, Ser: 7.26 mg/dL — ABNORMAL HIGH (ref 0.61–1.24)
GFR, Estimated: 7 mL/min — ABNORMAL LOW (ref >60)
Glucose, Bld: 104 mg/dL — ABNORMAL HIGH (ref 70–99)
Phosphorus: 3.8 mg/dL (ref 2.5–4.6)
Potassium: 4.3 mmol/L (ref 3.5–5.1)
Sodium: 142 mmol/L (ref 135–145)

## 2022-03-03 LAB — PROTIME-INR
INR: 2.7 — ABNORMAL HIGH (ref 0.8–1.2)
Prothrombin Time: 28.1 seconds — ABNORMAL HIGH (ref 11.4–15.2)

## 2022-03-03 LAB — CBC
HCT: 25.4 % — ABNORMAL LOW (ref 39.0–52.0)
Hemoglobin: 8.4 g/dL — ABNORMAL LOW (ref 13.0–17.0)
MCH: 31.3 pg (ref 26.0–34.0)
MCHC: 33.1 g/dL (ref 30.0–36.0)
MCV: 94.8 fL (ref 80.0–100.0)
Platelets: 80 10*3/uL — ABNORMAL LOW (ref 150–400)
RBC: 2.68 MIL/uL — ABNORMAL LOW (ref 4.22–5.81)
RDW: 23 % — ABNORMAL HIGH (ref 11.5–15.5)
WBC: 5.2 10*3/uL (ref 4.0–10.5)
nRBC: 1.5 % — ABNORMAL HIGH (ref 0.0–0.2)

## 2022-03-03 LAB — T4, FREE: Free T4: 0.8 ng/dL (ref 0.61–1.12)

## 2022-03-03 LAB — MAGNESIUM: Magnesium: 2.5 mg/dL — ABNORMAL HIGH (ref 1.7–2.4)

## 2022-03-03 LAB — TSH: TSH: 4.907 u[IU]/mL — ABNORMAL HIGH (ref 0.350–4.500)

## 2022-03-03 NOTE — Progress Notes (Signed)
PROGRESS NOTE    Kenden Brandt  GYB:[id number] DOB: July 04, 1945 DOA: 02/27/2022 PCP: [name], MD   Brief Narrative:  76 year old with history of ESRD on HD MWF, chronic CHF with reduced EF, P A-fib, alcohol and tobacco abuse, HTN, HLD, MR not a candidate for mitral clip, depression came to the ED with 1 week of rectal pain.  He was seen by his PCP and sent to [place] for CT scan due to concerns of fistula as he has a history of anal fissure and hemorrhoids followed by Dr. Almyra Free.  He was also recently admitted about a month ago for GI bleed requiring PRBC transfusion and endoscopy showing Barrett's esophagus and angiodysplastic lesion. During this admission for blood transaminitis, right upper quadrant showed cholelithiasis with contracted gallbladder, ascites, fatty liver.  CT showed concerns of cholelithiasis, hepatic cirrhosis and possible proctitis.  Status post MRCP which did not show obvious obstruction, no plans for surgical intervention.  Amiodarone was discontinued.   Assessment & Plan:  Principal Problem:   Prostatitis Active Problems:   Transaminitis   Cholelithiasis   ESRD on hemodialysis (HCC)   Paroxysmal atrial fibrillation (HCC)   Chronic systolic CHF (congestive heart failure) (HCC)   CAD (coronary artery disease)   Mitral regurgitation   Anxiety and Depression   HLD (hyperlipidemia)   Normocytic anemia   Cirrhosis of liver with ascites (HCC)   Elevated INR     Assessment and Plan: * Prostatitis -Followed by GI.  Rectal pain improving with mesalamine enema.  Monitor LFTs  Transaminitis with fatty infiltration/cirrhosis, elevated total bilirubin, slight improvement Supratherapeutic INR Hx of transminitis in February of 2023 and underwent MRI  liver due to elevated LFTs which showed 12 mm benign lesions.  Does have a history of alcohol use but also patient has severe MR/CHF and on amiodarone but low suspicion. .  Hepatitis panel is  negative.  GI team following, likely planning for paracentesis.  Decision deferred to Dr. Benson Norway.  Received IV vitamin K, INR today 2.7  Cholelithiasis Elevated liver enzymes and bilirutin in setting of cirrhosis.  Negative Murphy sign on gallbladder ultrasound.  MRCP does not show any obvious obstruction, general surgery following, no further surgical intervention  ESRD on hemodialysis ([place]) HD on MWF, HD per nephro  Paroxysmal atrial fibrillation (HCC) Amiodarone discontinued. Eliquis remains on Hold.  Appreciate input from cardiology team.  Anemia of chronic disease Thrombocytopenia - No active bleeding, monitor.  Hemoglobin at baseline of 10.0.  Platelets are slightly dropped from 140s range to 90s we will continue to monitor Normal folate, elevated ferritin  Elevated AG -Monitor with HD  Chronic systolic CHF (congestive heart failure) ([place]) TEE 06/23: EF 25-30%, RV mildly reduced, severe ischemic MR but overall appears to be euvolemic May require RHC  CAD (coronary artery disease) LHC (1/23): Occluded RCA with collaterals, serial 50%/80%/80% mid-distal LCx stenoses, serial 60%/70%/50% proximal to mid LAD stenoses, 70% ostial D1. No good PCI targets and not a CABG candidate.  Chest pain-free  Mitral regurgitation Severe ischemic MR on 6/23 TEE.  Not candidate for Mitraclip per structural heat team due to poor functional capacity and challenging imaging.   Anxiety and Depression Continue zoloft   HLD (hyperlipidemia) Hold statin  Chronic hypoxia - Uses nocturnal 2 L nasal cannula  PT-home health   DVT prophylaxis: Place and maintain sequential compression device Start: 03/01/22 0757Supratherapeutic INR Code Status: Full Family Communication: Family at bedside  Status is: Inpatient Remains inpatient appropriate because:  Multiple ongoing medical issues, will be in the hospital for atleast 2-3 days.  Final decision per GI    Subjective: Resting no complaints, feels  ok  Examination: Constitutional: Not in acute distress. Chronically ill Respiratory: Clear to auscultation bilaterally Cardiovascular: Normal sinus rhythm, no rubs, 4-62VO jvd; + Systolic Murmur.  Abdomen: Nontender nondistended good bowel sounds Musculoskeletal: No edema noted Skin: No rashes seen Neurologic: CN 2-12 grossly intact.  And nonfocal Psychiatric: Normal judgment and insight. Alert and oriented x 3. Normal mood.   LUE fistula  Objective: Vitals:   03/02/22 1224 03/02/22 1828 03/02/22 2138 03/03/22 0607  BP: (!) 99/57 (!) 157/71 96/63 (!) 96/54  Pulse: 90 91 89 74  Resp: '16 18 18 18  '$ Temp: 98.5 F (36.9 C) 98.1 F (36.7 C) 98 F (36.7 C) (!) 97.4 F (36.3 C)  TempSrc: Oral Oral Oral Oral  SpO2: 95% 100% 91% 90%  Weight:      Height:        Intake/Output Summary (Last 24 hours) at 03/03/2022 0745 Last data filed at 03/03/2022 3500 Gross per 24 hour  Intake 240 ml  Output 0 ml  Net 240 ml   Filed Weights   03/01/22 1533 03/01/22 2018 03/01/22 2121  Weight: 72.5 kg 69.7 kg 68.4 kg     Data Reviewed:   CBC: Recent Labs  Lab 02/27/22 2047 03/01/22 0203 03/02/22 0327 03/03/22 0337  WBC 7.2 6.2 7.2 5.2  NEUTROABS 6.0  --   --   --   HGB 10.4* 10.1* 9.4* 8.4*  HCT 30.5* 31.1* 28.3* 25.4*  MCV 92.4 96.3 95.0 94.8  PLT 126* 97* 82* 80*   Basic Metabolic Panel: Recent Labs  Lab 02/27/22 2047 03/01/22 0203 03/02/22 0327 03/03/22 0337  NA 139 136 137 138  K 3.9 4.9 4.0 4.1  CL 94* 92* 95* 96*  CO2 '31 28 26 24  '$ GLUCOSE 99 114* 108* 108*  BUN 34* 41* 23 36*  CREATININE 6.57* 8.72* 5.79* 7.05*  CALCIUM 9.7 9.7 9.3 9.3  MG  --   --  2.4 2.5*   GFR: Estimated Creatinine Clearance: 8.6 mL/min (A) (by C-G formula based on SCr of 7.05 mg/dL (H)). Liver Function Tests: Recent Labs  Lab 02/27/22 2047 02/28/22 1814 03/01/22 0203 03/02/22 0327 03/03/22 0337  AST 231*  --  293* 295* 234*  ALT 126*  --  160* 177* 145*  ALKPHOS 115  --  111 110  100  BILITOT 3.0* 3.2* 3.1* 3.6* 2.6*  PROT 6.9  --  6.2* 6.0* 5.4*  ALBUMIN 3.7  --  2.8* 2.7* 2.5*   Recent Labs  Lab 02/28/22 1814  LIPASE 61*   Recent Labs  Lab 03/02/22 1329  AMMONIA 33   Coagulation Profile: Recent Labs  Lab 02/28/22 1814 03/01/22 0203 03/02/22 0327 03/03/22 0337  INR 5.2* 4.6* 3.7* 2.7*   Cardiac Enzymes: No results for input(s): "CKTOTAL", "CKMB", "CKMBINDEX", "TROPONINI" in the last 168 hours. BNP (last 3 results) No results for input(s): "PROBNP" in the last 8760 hours. HbA1C: No results for input(s): "HGBA1C" in the last 72 hours. CBG: Recent Labs  Lab 02/28/22 1348  GLUCAP 87   Lipid Profile: No results for input(s): "CHOL", "HDL", "LDLCALC", "TRIG", "CHOLHDL", "LDLDIRECT" in the last 72 hours. Thyroid Function Tests: Recent Labs    03/03/22 0337  TSH 4.907*  FREET4 0.80   Anemia Panel: Recent Labs    03/02/22 0327  FOLATE 9.4  FERRITIN 2,487*  TIBC 263  IRON 109   Sepsis Labs: Recent Labs  Lab 02/27/22 2047 02/28/22 0300  LATICACIDVEN 2.1* 1.9    Recent Results (from the past 240 hour(s))  Blood culture (routine x 2)     Status: None (Preliminary result)   Collection Time: 02/27/22  8:53 PM   Specimen: BLOOD  Result Value Ref Range Status   Specimen Description   Final    BLOOD BLOOD RIGHT WRIST Performed at [facility], [address] [place], [place] [address]    Special Requests   Final    BOTTLES DRAWN AEROBIC AND ANAEROBIC Blood Culture adequate volume Performed at [facility], [address] [place], [place] [address]    Culture   Final    NO GROWTH 2 DAYS Performed at [facility], [employer] [address] [place], [place] [address]    Report Status PENDING  Incomplete  Blood culture (routine x 2)     Status: None (Preliminary result)   Collection Time: 02/27/22  9:06 PM   Specimen: BLOOD  Result Value Ref Range Status   Specimen Description   Final    BLOOD BLOOD RIGHT  FOREARM Performed at [facility], [address] [place], [place] [address]    Special Requests   Final    BOTTLES DRAWN AEROBIC AND ANAEROBIC Blood Culture adequate volume Performed at [facility], [address] [place], [place] [address]    Culture   Final    NO GROWTH 2 DAYS Performed at [facility], [place] [address] [place], [place] [address]    Report Status PENDING  Incomplete         Radiology Studies: No results found.      Scheduled Meds:  gabapentin  100 mg Oral QHS   mesalamine  4 g Rectal QHS   midodrine  15 mg Oral TID WC   pantoprazole  40 mg Oral BID   sertraline  25 mg Oral q AM   sodium chloride flush  3 mL Intravenous Q12H   Continuous Infusions:  sodium chloride       LOS: 2 days   Time spent= 35 mins    Anara Cowman Arsenio Loader, MD [facility]  If 7PM-7AM, please contact night-coverage  03/03/2022, 7:45 AM

## 2022-03-03 NOTE — Progress Notes (Signed)
Subjective: Patient seen and examined; chart reviewed; patient seems to be doing fairly well today.  He denies having any abdominal pain nausea vomiting.  He had 1 BM earlier today. There is no history of melena hematochezia.  The rectal pain has improved significantly.  He is currently on hemodialysis.  MRI/MRCP done on 02/28/2022 revealed cholelithiasis without any evidence of acute cholecystitis and a 4 mm CBD with no evidence of choledocholithiasis suspected cirrhosis with nodular appearing liver and moderate hepatic steatosis of the scattered hepatic cysts measuring up to 14 mm in size presumed to be benign in nature.  Objective: Vital signs in last 24 hours: Temp:  [97.4 F (36.3 C)-98.5 F (36.9 C)] 97.4 F (36.3 C) (10/30 0607) Pulse Rate:  [74-91] 74 (10/30 0607) Resp:  [16-18] 18 (10/30 0607) BP: (91-157)/(54-71) 96/54 (10/30 0607) SpO2:  [90 %-100 %] 90 % (10/30 0607) Last BM Date : 03/02/22  Intake/Output from previous day: 10/29 0701 - 10/30 0700 In: 240 [P.O.:240] Out: 0  Intake/Output this shift: Total I/O In: 240 [P.O.:240] Out: 0   General appearance: alert, cooperative, appears older than stated age, fatigued, and no distress Resp: clear to auscultation bilaterally Cardio: regular rate and rhythm, S1, S2 normal, no murmur, click, rub or gallop Abdomen is soft, nontender slightly distended with hyperactive bowel sounds  Lab Results: Recent Labs    03/01/22 0203 03/02/22 0327 03/03/22 0337  WBC 6.2 7.2 5.2  HGB 10.1* 9.4* 8.4*  HCT 31.1* 28.3* 25.4*  PLT 97* 82* 80*   BMET Recent Labs    03/01/22 0203 03/02/22 0327 03/03/22 0337  NA 136 137 138  K 4.9 4.0 4.1  CL 92* 95* 96*  CO2 '28 26 24  '$ GLUCOSE 114* 108* 108*  BUN 41* 23 36*  CREATININE 8.72* 5.79* 7.05*  CALCIUM 9.7 9.3 9.3   LFT Recent Labs    02/28/22 1814 03/01/22 0203 03/03/22 0337  PROT  --    < > 5.4*  ALBUMIN  --    < > 2.5*  AST  --    < > 234*  ALT  --    < > 145*  ALKPHOS   --    < > 100  BILITOT 3.2*   < > 2.6*  BILIDIR 1.5*  --   --   IBILI 1.7*  --   --    < > = values in this interval not displayed.   PT/INR Recent Labs    03/02/22 0327 03/03/22 0337  LABPROT 36.0* 28.1*  INR 3.7* 2.7*   Hepatitis Panel Recent Labs    02/28/22 1814 03/01/22 0203  HEPBSAG NON REACTIVE NON REACTIVE  HCVAB NON REACTIVE  --   HEPAIGM NON REACTIVE  --   HEPBIGM NON REACTIVE  --     Studies/Results: No results found.  Medications: I have reviewed the patient's current medications. Prior to Admission:  Medications Prior to Admission  Medication Sig Dispense Refill Last Dose   acetaminophen (TYLENOL) 650 MG CR tablet Take 1,300 mg by mouth every 8 (eight) hours as needed for pain.   02/27/2022   amiodarone (PACERONE) 200 MG tablet Take 1 tablet (200 mg total) by mouth daily. 90 tablet 2 02/27/2022   apixaban (ELIQUIS) 5 MG TABS tablet Take 1 tablet (5 mg total) by mouth 2 (two) times daily. 60 tablet 6 02/27/2022 at 2130   atorvastatin (LIPITOR) 80 MG tablet Take 1 tablet (80 mg total) by mouth daily. (Patient taking differently: Take 80 mg  by mouth at bedtime.) 90 tablet 3 Past Week   cholecalciferol (VITAMIN D3) 25 MCG (1000 UNIT) tablet Take 1,000 Units by mouth in the morning.   02/27/2022   ethyl chloride spray Apply 1 Application topically as needed (prior to dialysis).   Past Week   gabapentin (NEURONTIN) 100 MG capsule Take 100 mg by mouth at bedtime.   02/27/2022   hydrocortisone (ANUSOL-HC) 25 MG suppository Place 1 suppository (25 mg total) rectally 2 (two) times daily. (Patient taking differently: Place 25 mg rectally 2 (two) times daily as needed for hemorrhoids.) 12 suppository 0 Past Week   hydrOXYzine (ATARAX) 25 MG tablet Take 25 mg by mouth daily.   Past Week   loratadine (CLARITIN) 10 MG tablet Take 10 mg by mouth every other day.   Past Month   LORazepam (ATIVAN) 0.5 MG tablet Take 0.5 mg by mouth as needed for anxiety.   Past Week   magnesium  oxide (MAG-OX) 400 (240 Mg) MG tablet Take 400 mg by mouth daily in the afternoon.   Past Week   melatonin 5 MG TABS Take 5-10 mg by mouth at bedtime as needed (sleep).      metoCLOPramide (REGLAN) 5 MG tablet Take 1 tablet (5 mg total) by mouth every 8 (eight) hours as needed for nausea. 6 tablet 0    midodrine (PROAMATINE) 10 MG tablet Take 1.5 tablets (15 mg total) by mouth 3 (three) times daily with meals. 150 tablet 6 02/27/2022   nitroGLYCERIN (NITROSTAT) 0.4 MG SL tablet Place 1 tablet (0.4 mg total) under the tongue every 5 (five) minutes as needed for chest pain. 100 tablet 3 Unknown   NONFORMULARY OR COMPOUNDED ITEM Place 1 Application rectally 2 (two) times daily as needed for hemorrhoids. Diltiazem 2% Ointment   Past Week   ondansetron (ZOFRAN) 4 MG tablet Take 1 tablet (4 mg total) by mouth daily as needed for nausea or vomiting. 30 tablet 1 Unknown   pantoprazole (PROTONIX) 40 MG tablet Take 1 tablet (40 mg total) by mouth 2 (two) times daily. 60 tablet 1 02/27/2022   phenylephrine (,USE FOR PREPARATION-H,) 0.25 % suppository Place 1 suppository rectally 2 (two) times daily as needed for hemorrhoids.   Past Week   polyethylene glycol powder (MIRALAX) 17 GM/SCOOP powder Take 17 g by mouth daily. Make be given every other day as well   Past Month   prochlorperazine (COMPAZINE) 5 MG tablet Take 5 mg by mouth 3 (three) times daily as needed for nausea or vomiting.   Past Month   sertraline (ZOLOFT) 25 MG tablet Take 25 mg by mouth in the morning.   02/27/2022   Scheduled:  gabapentin  100 mg Oral QHS   mesalamine  4 g Rectal QHS   midodrine  15 mg Oral TID WC   pantoprazole  40 mg Oral BID   sertraline  25 mg Oral q AM   sodium chloride flush  3 mL Intravenous Q12H   Continuous:  sodium chloride     Assessment/Plan: 1) Abnormal LFT's/fatty liver-probably multifactorial with additional insult due to Amiodarone-LFTs seem to be improving patient is no longer on Amiodarone. 2)  Proctitis improving on Mesalamine. 3) History of alcohol abuse/cirrhosis-last drink was in December 2022 CAD/chronic systolic  4) CHF-ischemic cardiomyopathy with infarct-related MR. 5) Valvular heart disease/history of atrial fibrillation now in normal sinus rhythm. 6) Asymptomatic cholelithiasis. 7) Anxiety and depression.  LOS: 2 days   Juanita Craver 03/03/2022, 6:26 AM

## 2022-03-03 NOTE — Progress Notes (Signed)
[facility] Progress Note   Subjective: Seen in room. Daughter at bedside. Verbalize concern about staff at HD unit but no issues here. HD later today on schedule.   Objective Vitals:   03/02/22 1828 03/02/22 2138 03/03/22 0607 03/03/22 0955  BP: (!) 157/71 96/63 (!) 96/54 (!) 96/54  Pulse: 91 89 74 76  Resp: '18 18 18 18  '$ Temp: 98.1 F (36.7 C) 98 F (36.7 C) (!) 97.4 F (36.3 C) 98.2 F (36.8 C)  TempSrc: Oral Oral Oral Oral  SpO2: 100% 91% 90% 94%  Weight:      Height:       Physical Exam General: Pt sitting in chair at bedside in NAD Heart: K0/X3 with 2/6 systolic M. SR on monitor.  Lungs: CTAB, no WOB.  Abdomen: protuberant, probable ascites present. NABS.  Extremities:Warm/dry, 0 edema, upper strengths +/= bilaterally Dialysis Access: AL AVG + thrill, no edema/abnormal bleeding/bruising   Additional Objective Labs: Basic Metabolic Panel: Recent Labs  Lab 03/01/22 0203 03/02/22 0327 03/03/22 0337  NA 136 137 138  K 4.9 4.0 4.1  CL 92* 95* 96*  CO2 '28 26 24  '$ GLUCOSE 114* 108* 108*  BUN 41* 23 36*  CREATININE 8.72* 5.79* 7.05*  CALCIUM 9.7 9.3 9.3   Liver Function Tests: Recent Labs  Lab 03/01/22 0203 03/02/22 0327 03/03/22 0337  AST 293* 295* 234*  ALT 160* 177* 145*  ALKPHOS 111 110 100  BILITOT 3.1* 3.6* 2.6*  PROT 6.2* 6.0* 5.4*  ALBUMIN 2.8* 2.7* 2.5*   Recent Labs  Lab 02/28/22 1814  LIPASE 61*   CBC: Recent Labs  Lab 02/27/22 2047 03/01/22 0203 03/02/22 0327 03/03/22 0337  WBC 7.2 6.2 7.2 5.2  NEUTROABS 6.0  --   --   --   HGB 10.4* 10.1* 9.4* 8.4*  HCT 30.5* 31.1* 28.3* 25.4*  MCV 92.4 96.3 95.0 94.8  PLT 126* 97* 82* 80*   Blood Culture    Component Value Date/Time   SDES  02/27/2022 2106    BLOOD BLOOD RIGHT FOREARM Performed at [facility], [address] [place], [place] [address]    [facility]  02/27/2022 2106    BOTTLES DRAWN AEROBIC AND ANAEROBIC Blood Culture adequate  volume Performed at [facility], [address] [place], [place] [address]    CULT  02/27/2022 2106    NO GROWTH 3 DAYS Performed at [facility] [address] [place], [place] [address]    REPTSTATUS PENDING 02/27/2022 2106    Cardiac Enzymes: No results for input(s): "CKTOTAL", "CKMB", "CKMBINDEX", "TROPONINI" in the last 168 hours. CBG: Recent Labs  Lab 02/28/22 1348  GLUCAP 87   Iron Studies:  Recent Labs    03/02/22 0327  IRON 109  TIBC 263  FERRITIN 2,487*   '@lablastinr3'$ @ Studies/Results: No results found. Medications:  sodium chloride      gabapentin  100 mg Oral QHS   mesalamine  4 g Rectal QHS   midodrine  15 mg Oral TID WC   pantoprazole  40 mg Oral BID   sertraline  25 mg Oral q AM   sodium chloride flush  3 mL Intravenous Q12H   HD orders: [facility] MWF 3:45 hrs 180NRe 400/500 70 kg 2.0 K/2.0 Ca UFP 2 AVG -No heparin -Mircera 30 mcg IV q 2 weeks (Last dose 02/24/22)  Assessment/Plan:    # Rectal pain  - felt 2/2 non-infectious prostatitis per charting - per primary team  and GI  # Transaminitis with fatty infiltration/cirrhosis - possible paracentesis today - Per primary/GI  # ESRD - HD per MWF usually  - Plan for HD today here off schedule then resume    Chronic hypotension  - controlled on midodrine   Anemia CKD - Hb acceptable at 8.4.  ESA recently administered   Metabolic bone disease  - Check PO4 if able to add to today's labs   Supratherapeutic INR - per primary team    Disposition - HD here today then dispo per primary team     Winnifred Dufford H. Federica Allport NP-C 03/03/2022, 11:45 AM  [employer] [phone]

## 2022-03-03 NOTE — Consult Note (Addendum)
Advanced Heart Failure Team Consult Note   Primary Physician: [name], MD PCP-Cardiologist:  Sanda Klein, MD  Reason for Consultation: elevated LFTs 2/2 amiodarone? HPI:    Ruben [name] is seen today for evaluation of elevated LFTs 2/2 amiodarone? at the request of Dr. Reesa Chew, internal medicine.   Ruben [name] 76 y.o. male with history of HTN, HLD, ESRD, tobacco and alcohol abuse (pint a [name] of wine formerly), depression.    Admitted 04/29/21 with suspected late presentation MI. Complicated by AF RVR. Subsequently developed cardiogenic shock. Required NE + milrinone. CVTS saw,not felt to be a surgical candidate. Started on Eliquis. Course c/b AKI on CKD, felt to be d/t worsening ATN in setting of shock/episodes of hypotension versus CIN. Placed on CVVHD and later switched to iHD.    Admitted 2/23 with rectal pain and dysphagia. GI consulted and patient manually disimpacted. Had MRI liver due to elevated LFTs which showed 12 mm benign lesions.    Was referred to structural heart team for consideration of MitraClip. His TEE imaging was very difficult. Given his poor functional capacity and challenging imaging, Dr. Burt Knack decided that he would be a poor Mitraclip candidate.   Presented to ED for rectal pain x1 week. Recently discharged (9/23) for GIB. AHF team asked to see for elevated LFTs in setting of amiodarone use. LFTs previously elevated 2/23.   Patient in bed, daughter at bedside. Denies CP and SOB. Taking all medications as prescribed.   Cardiac studies reviewed:  TEE 06/23: EF 25-30%, RV mildly reduced, severe ischemic MR Echo 06/23: EF 40-45%, moderate to severe ischemic MR 12/22: R/LHC (initially delayed d/t AKI on CKD): Occluded RCA (culprit), diffuse disease in LAD and Lcx. On 0.25 mil RA mean 16, PA 66/26 (41), PCWP 27, PA 62%, Fick CO 5.04/ CI 2.56.  12/22 Echo with LVEF 35-40%, inferior hypokinesis, RV okay, splay artifact with likely moderate to  severe MR  Home Medications Prior to Admission medications   Medication Sig Start Date End Date Taking? Authorizing Provider  acetaminophen (TYLENOL) 650 MG CR tablet Take 1,300 mg by mouth every 8 (eight) hours as needed for pain.   Yes [provider]  amiodarone (PACERONE) 200 MG tablet Take 1 tablet (200 mg total) by mouth daily. 11/11/21  Yes Larey Dresser, MD  amoxicillin-clavulanate (AUGMENTIN) 875-125 MG tablet Take 1 tablet by mouth every 12 (twelve) hours. 02/28/22  Yes Palumbo, April, MD  apixaban (ELIQUIS) 5 MG TABS tablet Take 1 tablet (5 mg total) by mouth 2 (two) times daily. 08/26/21  Yes Milford, Maricela Bo, FNP  atorvastatin (LIPITOR) 80 MG tablet Take 1 tablet (80 mg total) by mouth daily. Patient taking differently: Take 80 mg by mouth at bedtime. 11/21/21  Yes Joette Catching, PA-C  cholecalciferol (VITAMIN D3) 25 MCG (1000 UNIT) tablet Take 1,000 Units by mouth in the morning.   Yes [provider]  dicyclomine (BENTYL) 20 MG tablet Take 1 tablet (20 mg total) by mouth 2 (two) times daily. 02/28/22  Yes Palumbo, April, MD  ethyl chloride spray Apply 1 Application topically as needed (prior to dialysis).   Yes [provider]  gabapentin (NEURONTIN) 100 MG capsule Take 100 mg by mouth at bedtime. 11/14/21  Yes [provider]  hydrocortisone (ANUSOL-HC) 25 MG suppository Place 1 suppository (25 mg total) rectally 2 (two) times daily. Patient taking differently: Place 25 mg rectally 2 (two) times daily as needed for hemorrhoids. 11/29/21  Yes Carlisle Cater,  PA-C  hydrOXYzine (ATARAX) 25 MG tablet Take 25 mg by mouth daily. 02/24/22  Yes [provider]  loratadine (CLARITIN) 10 MG tablet Take 10 mg by mouth every other [name].   Yes [provider]  LORazepam (ATIVAN) 0.5 MG tablet Take 0.5 mg by mouth as needed for anxiety. 02/12/22  Yes [provider]  magnesium oxide (MAG-OX) 400 (240 Mg) MG tablet Take 400 mg  by mouth daily in the afternoon. 07/05/21  Yes [provider]  melatonin 5 MG TABS Take 5-10 mg by mouth at bedtime as needed (sleep).   Yes [provider]  metoCLOPramide (REGLAN) 5 MG tablet Take 1 tablet (5 mg total) by mouth every 8 (eight) hours as needed for nausea. 07/12/21  Yes Couture, Cortni S, PA-C  midodrine (PROAMATINE) 10 MG tablet Take 1.5 tablets (15 mg total) by mouth 3 (three) times daily with meals. 11/21/21  Yes Joette Catching, PA-C  nitroGLYCERIN (NITROSTAT) 0.4 MG SL tablet Place 1 tablet (0.4 mg total) under the tongue every 5 (five) minutes as needed for chest pain. 05/28/21 05/28/22 Yes Clegg, Amy D, NP  NONFORMULARY OR COMPOUNDED ITEM Place 1 Application rectally 2 (two) times daily as needed for hemorrhoids. Diltiazem 2% Ointment 11/29/21  Yes [provider]  ondansetron (ZOFRAN) 4 MG tablet Take 1 tablet (4 mg total) by mouth daily as needed for nausea or vomiting. 06/04/21 06/04/22 Yes [name], FNP  pantoprazole (PROTONIX) 40 MG tablet Take 1 tablet (40 mg total) by mouth 2 (two) times daily. 02/05/22  Yes Dessa Phi, DO  phenylephrine (,USE FOR PREPARATION-H,) 0.25 % suppository Place 1 suppository rectally 2 (two) times daily as needed for hemorrhoids.   Yes [provider]  polyethylene glycol powder (MIRALAX) 17 GM/SCOOP powder Take 17 g by mouth daily. Make be given every other [name] as well   Yes [provider]  prochlorperazine (COMPAZINE) 5 MG tablet Take 5 mg by mouth 3 (three) times daily as needed for nausea or vomiting. 09/05/21  Yes [provider]  sertraline (ZOLOFT) 25 MG tablet Take 25 mg by mouth in the morning. 10/23/21  Yes [provider]    Past Medical History: Past Medical History:  Diagnosis Date   Arthritis    Chronic kidney disease, stage 3b ([place])    ESRD (end stage renal disease) ([place])    ETOH abuse    History of stress test 09/02/2008   showed inferolateral scar without  ischemia   Hx of echocardiogram 09/02/2008   was essentially normal   Hyperlipidemia    Hypertension    Myocardial infarction [facility])    Tobacco abuse     Past Surgical History: Past Surgical History:  Procedure Laterality Date   AV FISTULA PLACEMENT Left 05/27/2021   Procedure: LEFT ARTERIOVENOUS (AV) GRAFT CREATION;  Surgeon: Broadus John, MD;  Location: [facility];  Service: Vascular;  Laterality: Left;  PERIPHERAL NERVE BLOCK   BACK SURGERY     Dr Luiz Ochoa involving L3-L4 discectomy.   BIOPSY  06/14/2021   Procedure: BIOPSY;  Surgeon: Carol Ada, MD;  Location[facility] ENDOSCOPY;  Service: Endoscopy;;   CARDIOVERSION N/A 05/13/2021   Procedure: CARDIOVERSION;  Surgeon: Larey Dresser, MD;  Location: [facility] ENDOSCOPY;  Service: Cardiovascular;  Laterality: N/A;   ESOPHAGOGASTRODUODENOSCOPY (EGD) WITH PROPOFOL N/A 02/04/2022   Procedure: ESOPHAGOGASTRODUODENOSCOPY (EGD) WITH PROPOFOL;  Surgeon: Carol Ada, MD;  Location: [place];  Service: Gastroenterology;  Laterality: N/A;   FLEXIBLE SIGMOIDOSCOPY N/A 06/14/2021  Procedure: FLEXIBLE SIGMOIDOSCOPY;  Surgeon: Carol Ada, MD;  Location: [place];  Service: Endoscopy;  Laterality: N/A;   HEMOSTASIS CLIP PLACEMENT  02/04/2022   Procedure: HEMOSTASIS CLIP PLACEMENT;  Surgeon: Carol Ada, MD;  Location: [place];  Service: Gastroenterology;;   HERNIA REPAIR     HOT HEMOSTASIS N/A 02/04/2022   Procedure: HOT HEMOSTASIS (ARGON PLASMA COAGULATION/BICAP);  Surgeon: Carol Ada, MD;  Location: [facility];  Service: Gastroenterology;  Laterality: N/A;   IR FLUORO GUIDE CV LINE RIGHT  05/21/2021   IR US GUIDE VASC ACCESS RIGHT  05/21/2021   RIGHT/LEFT HEART CATH AND CORONARY ANGIOGRAPHY N/A 05/09/2021   Procedure: RIGHT/LEFT HEART CATH AND CORONARY ANGIOGRAPHY;  Surgeon: Larey Dresser, MD;  Location: [facility] CV LAB;  Service: Cardiovascular;  Laterality: N/A;   SPINE SURGERY     TEE WITHOUT CARDIOVERSION N/A 05/07/2021    Procedure: TRANSESOPHAGEAL ECHOCARDIOGRAM (TEE);  Surgeon: Larey Dresser, MD;  Location: [facility] ENDOSCOPY;  Service: Cardiovascular;  Laterality: N/A;   TEE WITHOUT CARDIOVERSION N/A 05/13/2021   Procedure: TRANSESOPHAGEAL ECHOCARDIOGRAM (TEE);  Surgeon: Larey Dresser, MD;  Location: [facility] ENDOSCOPY;  Service: Cardiovascular;  Laterality: N/A;   TEE WITHOUT CARDIOVERSION N/A 10/31/2021   Procedure: TRANSESOPHAGEAL ECHOCARDIOGRAM (TEE);  Surgeon: Larey Dresser, MD;  Location: [facility] ENDOSCOPY;  Service: Cardiovascular;  Laterality: N/A;    Family History: Family History  Problem Relation Age of Onset   Heart attack Brother    Diabetes Brother    Heart disease Brother    Cancer Father    Diabetes Brother    Heart disease Brother     Social History: Social History   Socioeconomic History   Marital status: Married    Spouse name: Not on file   Number of children: Not on file   Years of education: Not on file   Highest education level: Not on file  Occupational History   Not on file  Tobacco Use   Smoking status: Former    Packs/[name]: 1.50    Years: 20.00    Total pack years: 30.00    Types: Cigarettes    Quit date: 04/28/2021    Years since quitting: 0.8   Smokeless tobacco: Never   Tobacco comments:    down to 2 packs per week; 3/21 - down to 1 Pack per week  Vaping Use   Vaping Use: Never used  Substance and Sexual Activity   Alcohol use: No    Alcohol/week: 0.0 standard drinks of alcohol   Drug use: No   Sexual activity: Not on file  Other Topics Concern   Not on file  Social History Narrative   Caffiene- rare.    Education: HS   Retired: Administrator.    Right handed   Lives at home with wife and son   Social Determinants of Health   Financial Resource Strain: Medium Risk (05/02/2021)   Overall Financial Resource Strain (CARDIA)    Difficulty of Paying Living Expenses: Somewhat hard  Food Insecurity: No Food Insecurity (05/02/2021)   Hunger Vital Sign     Worried About Running Out of Food in the Last Year: Never true    Ran Out of Food in the Last Year: Never true  Transportation Needs: No Transportation Needs (05/02/2021)   PRAPARE - Hydrologist (Medical): No    Lack of Transportation (Non-Medical): No  Physical Activity: Not on file  Stress: Not on file  Social Connections: Not on file  Allergies:  Allergies  Allergen Reactions   Other Other (See Comments)    Pollen - sneezing, itching/watery eyes    Objective:    Vital Signs:   Temp:  [97.4 F (36.3 C)-98.5 F (36.9 C)] 97.4 F (36.3 C) (10/30 0607) Pulse Rate:  [74-91] 74 (10/30 0607) Resp:  [16-18] 18 (10/30 0607) BP: (96-157)/(54-71) 96/54 (10/30 0607) SpO2:  [90 %-100 %] 90 % (10/30 0607) Last BM Date : 03/02/22  Weight change: Filed Weights   03/01/22 1533 03/01/22 2018 03/01/22 2121  Weight: 72.5 kg 69.7 kg 68.4 kg    Intake/Output:   Intake/Output Summary (Last 24 hours) at 03/03/2022 0923 Last data filed at 03/03/2022 0607 Gross per 24 hour  Intake 240 ml  Output 0 ml  Net 240 ml      Physical Exam    General:  chronically ill appearing.  No respiratory difficulty HEENT: normal Neck: supple. JVD ~8 cm. Carotids 2+ bilat; no bruits. No lymphadenopathy or thyromegaly appreciated. Cor: PMI nondisplaced. Regular rate & rhythm. No rubs, gallops or murmurs. Lungs: clear, diminished bases Abdomen: soft, nontender, nondistended. No hepatosplenomegaly. No bruits or masses. Good bowel sounds. Extremities: no cyanosis, clubbing, rash, edema. L Fistula, +bruit/thrill  Neuro: alert & oriented x 3, cranial nerves grossly intact. moves all 4 extremities w/o difficulty. Affect pleasant.   Telemetry   NSR 70s (Personally reviewed)    EKG    Normal sinus rhythm 90bpm Possible Inferior infarct , age undetermined  Labs   Basic Metabolic Panel: Recent Labs  Lab 02/27/22 2047 03/01/22 0203 03/02/22 0327 03/03/22 0337  NA  139 136 137 138  K 3.9 4.9 4.0 4.1  CL 94* 92* 95* 96*  CO2 '31 28 26 24  '$ GLUCOSE 99 114* 108* 108*  BUN 34* 41* 23 36*  CREATININE 6.57* 8.72* 5.79* 7.05*  CALCIUM 9.7 9.7 9.3 9.3  MG  --   --  2.4 2.5*    Liver Function Tests: Recent Labs  Lab 02/27/22 2047 02/28/22 1814 03/01/22 0203 03/02/22 0327 03/03/22 0337  AST 231*  --  293* 295* 234*  ALT 126*  --  160* 177* 145*  ALKPHOS 115  --  111 110 100  BILITOT 3.0* 3.2* 3.1* 3.6* 2.6*  PROT 6.9  --  6.2* 6.0* 5.4*  ALBUMIN 3.7  --  2.8* 2.7* 2.5*   Recent Labs  Lab 02/28/22 1814  LIPASE 61*   Recent Labs  Lab 03/02/22 1329  AMMONIA 33    CBC: Recent Labs  Lab 02/27/22 2047 03/01/22 0203 03/02/22 0327 03/03/22 0337  WBC 7.2 6.2 7.2 5.2  NEUTROABS 6.0  --   --   --   HGB 10.4* 10.1* 9.4* 8.4*  HCT 30.5* 31.1* 28.3* 25.4*  MCV 92.4 96.3 95.0 94.8  PLT 126* 97* 82* 80*    Cardiac Enzymes: No results for input(s): "CKTOTAL", "CKMB", "CKMBINDEX", "TROPONINI" in the last 168 hours.  BNP: BNP (last 3 results) Recent Labs    06/02/21 0958 06/14/21 0041 02/28/22 1814  BNP 3,498.9* >4,500.0* >4,500.0*    ProBNP (last 3 results) No results for input(s): "PROBNP" in the last 8760 hours.   CBG: Recent Labs  Lab 02/28/22 1348  GLUCAP 87    Coagulation Studies: Recent Labs    02/28/22 1814 03/01/22 0203 03/02/22 0327 03/03/22 0337  LABPROT 47.7* 43.0* 36.0* 28.1*  INR 5.2* 4.6* 3.7* 2.7*     Imaging   No results found.   Medications:  Current Medications:  gabapentin  100 mg Oral QHS   mesalamine  4 g Rectal QHS   midodrine  15 mg Oral TID WC   pantoprazole  40 mg Oral BID   sertraline  25 mg Oral q AM   sodium chloride flush  3 mL Intravenous Q12H    Infusions:  sodium chloride        Patient Profile  Ruben [name] 76 y.o. male with history of HTN, HLD, ESRD, PAF, ischemic DCM, CAD, tobacco and alcohol abuse (pint a [name] of wine formerly), depression.   Assessment/Plan  Transaminitis, elevated bilirubin, suspected 2/2 amiodarone use - liver MRI 2/23: Benign liver cysts, stable since 2019 CT. 12 mm benign lesions - Abd MRCP: Mildly nodular hepatic contour, suggesting cirrhosis. Moderate hepatic steatosis. Scattered probable hepatic cysts, measuring up to 14 mm - GI following, additional testing pending - AST 31>231>293>295>234 - ALT 33>126>160>177>145 - tbili 1.5>3>3.2>3.1>3.6>2.6 - Previously on amiodarone, last dose 10/28 w/ concern of amiodarone related liver toxicity. All values now downtrending. - Ok to hold amiodarone. Remains in NSR. Liver enzymes downtrending - liver biopsy being considered per GI - INR 2.7, Eliquis currently on hold 2. CAD: Suspect delayed presentation inferior MI in 12/22.  He was not cathed initially due to AKI and delayed presentation. Eventual LHC with occluded RCA (culprit), diffuse moderate disease in LAD and LCx. No good PCI targets. Not a candidate for CABG. No chest pain now.   - Continue atorvastatin - Previously on Eliquis so no ASA.   3. Chronic systolic CHF: Ischemic cardiomyopathy with infarct-related MR. Echo in 12/22 with EF 35-40%, inferolateral and basal inferior akinesis, moderate-severe eccentric likely infarct-related MR, normal RV. TEE in 1/23 with EF 35-40%, moderate MR (suspect infarct-related), aortic dilatation measuring 44 mm, moderate AI. Cardiogenic shock requiring milrinone during 12/22-1/23 hospitalization, titrated off.  Now requiring midodrine to maintain BP with HD. Echo 6/23 with EF 40-45%, RV mildly reduced, moderate to severe ischemic MR. TEE in 6/23 with EF 25-30%, RV mildly reduced, severe MR.  On exam, he is not volume overloaded. NYHA class III chronically. Appetite and energy have been low, daughter at bedside encouraging eating and ambulation.  - Continue midodrine 15 mg TID - HD for volume management.  - Unable to initiate GDMT due to low BP/ESRD.  4. Valvular heart disease:   Severe ischemic MR on 6/23 TEE.  Not candidate for Mitraclip per structural heat team due to poor functional capacity and challenging imaging.  5. Atrial fibrillation: First noted in 12/22, s/p TEE-guided DCCV in 1/23. NSR today.  - Previously on amiodarone 200 mg daily, continue to hold with elevated enzymes.  - Eliquis on hold with elevated INR, start hep gtt if <2 6. ESRD: Patient has trouble with hypotension with HD.   - Now on midodrine 15 mg TID - nephrology following, volume management w/ HD, next treatment today 7. Smoker: Quit 04/28/21. 8. ETOH abuse: Last drink was 04/28/21.  9. Rectal pain - Per primary  Length of Stay: [place], AGACNP-BC  03/03/2022, 9:23 AM  Advanced Heart Failure Team Pager [phone] (M-F; 7a - 5p)  Please contact [facility] for night-coverage after hours (4p -7a ) and weekends on [email or web address]

## 2022-03-03 NOTE — Progress Notes (Addendum)
Received patient in bed to unit.  Alert and oriented.  Informed consent signed and in chart.   Treatment initiated: 1530 Treatment completed: 1830  Patient tolerated well.  Transported back to the room  Alert, without acute distress.  Hand-off given to patient's nurse.   Access used: Graft  Access issues: difficult to Cannulate   Total UF removed: 0.7 Medication(s) given: n/a Post HD weight: 68.1KG       03/03/22 1830  Vitals  Temp 97.7 F (36.5 C)  Temp Source Oral  BP (!) 101/55  MAP (mmHg) 69  BP Location Right Arm  BP Method Automatic  Patient Position (if appropriate) Lying  Pulse Rate 80  Pulse Rate Source Monitor  ECG Heart Rate 81  Resp 18  Oxygen Therapy  SpO2 97 %  O2 Device Room Air  During Treatment Monitoring  Intra-Hemodialysis Comments Tolerated well;Tx completed  Post Treatment  Dialyzer Clearance Lightly streaked  Duration of HD Treatment -hour(s) 2.53 hour(s)  Hemodialysis Intake (mL) 0 mL  Liters Processed 60.7  Fluid Removed 700 mL  Tolerated HD Treatment No (Comment)  Post-Hemodialysis Comments Pt signed off AMA with 52 minutes left of tx,  AVG/AVF Arterial Site Held (minutes) 8 minutes  AVG/AVF Venous Site Held (minutes) 8 minutes  Fistula / Graft Left Upper arm Arteriovenous vein graft  Placement Date/Time: 05/27/21 1347   Placed prior to admission: No  Orientation: Left  Access Location: Upper arm  Access Type: Arteriovenous vein graft  Site Condition No complications  Fistula / Graft Assessment Present;Thrill;Bruit  Status Deaccessed      [facility]

## 2022-03-04 ENCOUNTER — Inpatient Hospital Stay (HOSPITAL_COMMUNITY): Payer: Medicare Other

## 2022-03-04 DIAGNOSIS — I255 Ischemic cardiomyopathy: Secondary | ICD-10-CM | POA: Diagnosis not present

## 2022-03-04 DIAGNOSIS — I2510 Atherosclerotic heart disease of native coronary artery without angina pectoris: Secondary | ICD-10-CM | POA: Diagnosis not present

## 2022-03-04 DIAGNOSIS — N186 End stage renal disease: Secondary | ICD-10-CM | POA: Diagnosis not present

## 2022-03-04 DIAGNOSIS — I129 Hypertensive chronic kidney disease with stage 1 through stage 4 chronic kidney disease, or unspecified chronic kidney disease: Secondary | ICD-10-CM | POA: Diagnosis not present

## 2022-03-04 DIAGNOSIS — Z992 Dependence on renal dialysis: Secondary | ICD-10-CM | POA: Diagnosis not present

## 2022-03-04 DIAGNOSIS — N410 Acute prostatitis: Secondary | ICD-10-CM | POA: Diagnosis not present

## 2022-03-04 DIAGNOSIS — I5023 Acute on chronic systolic (congestive) heart failure: Secondary | ICD-10-CM | POA: Diagnosis not present

## 2022-03-04 DIAGNOSIS — F064 Anxiety disorder due to known physiological condition: Secondary | ICD-10-CM | POA: Diagnosis not present

## 2022-03-04 LAB — COMPREHENSIVE METABOLIC PANEL
ALT: 133 U/L — ABNORMAL HIGH (ref 0–44)
AST: 182 U/L — ABNORMAL HIGH (ref 15–41)
Albumin: 2.4 g/dL — ABNORMAL LOW (ref 3.5–5.0)
Alkaline Phosphatase: 108 U/L (ref 38–126)
Anion gap: 14 (ref 5–15)
BUN: 21 mg/dL (ref 8–23)
CO2: 27 mmol/L (ref 22–32)
Calcium: 9.1 mg/dL (ref 8.9–10.3)
Chloride: 98 mmol/L (ref 98–111)
Creatinine, Ser: 5.26 mg/dL — ABNORMAL HIGH (ref 0.61–1.24)
GFR, Estimated: 11 mL/min — ABNORMAL LOW (ref >60)
Glucose, Bld: 116 mg/dL — ABNORMAL HIGH (ref 70–99)
Potassium: 3.4 mmol/L — ABNORMAL LOW (ref 3.5–5.1)
Sodium: 139 mmol/L (ref 135–145)
Total Bilirubin: 2.4 mg/dL — ABNORMAL HIGH (ref 0.3–1.2)
Total Protein: 5.4 g/dL — ABNORMAL LOW (ref 6.5–8.1)

## 2022-03-04 LAB — ANTI-SMOOTH MUSCLE ANTIBODY, IGG: F-Actin IgG: 68 Units — ABNORMAL HIGH (ref 0–19)

## 2022-03-04 LAB — CBC
HCT: 25.4 % — ABNORMAL LOW (ref 39.0–52.0)
Hemoglobin: 8.4 g/dL — ABNORMAL LOW (ref 13.0–17.0)
MCH: 31.5 pg (ref 26.0–34.0)
MCHC: 33.1 g/dL (ref 30.0–36.0)
MCV: 95.1 fL (ref 80.0–100.0)
Platelets: 77 10*3/uL — ABNORMAL LOW (ref 150–400)
RBC: 2.67 MIL/uL — ABNORMAL LOW (ref 4.22–5.81)
RDW: 23.1 % — ABNORMAL HIGH (ref 11.5–15.5)
WBC: 6.1 10*3/uL (ref 4.0–10.5)
nRBC: 1.8 % — ABNORMAL HIGH (ref 0.0–0.2)

## 2022-03-04 LAB — PROTIME-INR
INR: 1.8 — ABNORMAL HIGH (ref 0.8–1.2)
Prothrombin Time: 20.6 seconds — ABNORMAL HIGH (ref 11.4–15.2)

## 2022-03-04 LAB — ECHOCARDIOGRAM LIMITED
Height: 70 in
P 1/2 time: 280 msec
S' Lateral: 4.7 cm
Weight: 2402.13 oz

## 2022-03-04 LAB — ANA W/REFLEX IF POSITIVE: Anti Nuclear Antibody (ANA): NEGATIVE

## 2022-03-04 LAB — MAGNESIUM: Magnesium: 2.1 mg/dL (ref 1.7–2.4)

## 2022-03-04 LAB — T3, FREE: T3, Free: 1 pg/mL — ABNORMAL LOW (ref 2.0–4.4)

## 2022-03-04 MED ORDER — HEPARIN (PORCINE) 25000 UT/250ML-% IV SOLN
600.0000 [IU]/h | INTRAVENOUS | Status: DC
  Administered 2022-03-04: 600 [IU]/h via INTRAVENOUS
  Filled 2022-03-04: qty 250

## 2022-03-04 MED ORDER — ALBUMIN HUMAN 25 % IV SOLN
25.0000 g | Freq: Four times a day (QID) | INTRAVENOUS | Status: AC
  Administered 2022-03-04 – 2022-03-05 (×3): 25 g via INTRAVENOUS
  Filled 2022-03-04 (×3): qty 100

## 2022-03-04 MED ORDER — CHLORHEXIDINE GLUCONATE CLOTH 2 % EX PADS: 6.0000 | MEDICATED_PAD | Freq: Every day | CUTANEOUS | Status: DC

## 2022-03-04 NOTE — Progress Notes (Signed)
Subjective: Feeling weak.  No complaints of abdominal pain or rectal pain.  Objective: Vital signs in last 24 hours: Temp:  [97.5 F (36.4 C)-98.1 F (36.7 C)] 97.5 F (36.4 C) (10/31 0943) Pulse Rate:  [71-80] 73 (10/31 0943) Resp:  [16-20] 18 (10/31 0552) BP: (86-105)/(50-65) 86/55 (10/31 0943) SpO2:  [89 %-100 %] 93 % (10/31 0943) Weight:  [68.1 kg-68.7 kg] 68.1 kg (10/30 1859) Last BM Date : 03/03/22  Intake/Output from previous day: 10/30 0701 - 10/31 0700 In: 120 [P.O.:120] Out: 700  Intake/Output this shift: No intake/output data recorded.  General appearance: alert and no distress GI: soft, non-tender; bowel sounds normal; no masses,  no organomegaly  Lab Results: Recent Labs    03/02/22 0327 03/03/22 0337 03/04/22 0422  WBC 7.2 5.2 6.1  HGB 9.4* 8.4* 8.4*  HCT 28.3* 25.4* 25.4*  PLT 82* 80* 77*   BMET Recent Labs    03/03/22 0330 03/03/22 0337 03/04/22 0422  NA 142 138 139  K 4.3 4.1 3.4*  CL 100 96* 98  CO2 21* 24 27  GLUCOSE 104* 108* 116*  BUN 37* 36* 21  CREATININE 7.26* 7.05* 5.26*  CALCIUM 9.4 9.3 9.1   LFT Recent Labs    03/04/22 0422  PROT 5.4*  ALBUMIN 2.4*  AST 182*  ALT 133*  ALKPHOS 108  BILITOT 2.4*   PT/INR Recent Labs    03/03/22 0337 03/04/22 0422  LABPROT 28.1* 20.6*  INR 2.7* 1.8*   Hepatitis Panel No results for input(s): "HEPBSAG", "HCVAB", "HEPAIGM", "HEPBIGM" in the last 72 hours. C-Diff No results for input(s): "CDIFFTOX" in the last 72 hours. Fecal Lactopherrin No results for input(s): "FECLLACTOFRN" in the last 72 hours.  Studies/Results: No results found.  Medications: Scheduled:  [START ON 03/05/2022] Chlorhexidine Gluconate Cloth  6 each Topical Q0600   gabapentin  100 mg Oral QHS   mesalamine  4 g Rectal QHS   midodrine  15 mg Oral TID WC   pantoprazole  40 mg Oral BID   sertraline  25 mg Oral q AM   sodium chloride flush  3 mL Intravenous Q12H   Continuous:  sodium chloride     albumin  human     heparin 600 Units/hr (03/04/22 1114)    Assessment/Plan: 1) Elevated liver enzymes. 2) Probable cirrhosis per imaging. 3) CHF - "nearing end stage heart disease". 4) Afib off of amiodarone. 5) Noninfectious proctitis - responding to enemas.   From the hepatic standpoint his liver enzymes are declining.  He is fluid overloaded.  The elevated liver enzymes are multifactorial.  Even though he is off of amiodarone, it is unlikely that the improved liver enzymes are as a result of holding amiodarone.  Dr. Aundra Dubin feels that he is nearing the point of end stage heart disease.  It is anticipated that he will have more hospitalizations in the upcoming months with worsening clinical status during each admission.  Plan: 1) Continue with albumin and midodrine. 2) Continue with mesalamine enemas, but it can be discontinued upon discharge. 3) No paracentesis or liver biopsy as this will not change the current management. 4) Upon discharge he can follow up in the office. 5) Signing off, but call with any questions.  LOS: 2 days   Kamri Gotsch D 03/04/2022, 12:22 PM

## 2022-03-04 NOTE — Progress Notes (Signed)
Physical Therapy Treatment Patient Details Name: Ruben [name] MRN: [id number] DOB: 10-26-1945 Today's Date: 03/04/2022   History of Present Illness 76 y/o male presented to ED on 02/27/22 for hemorrhoid check to rule out rectal abscess. CT showed rectal wall thickening and cirrhosis. PMH: proctitis, CHF, severe mitral regurgitation, cirrhosis, hx of ETOH abuse, PAF, hx of MI    PT Comments    Continuing work on functional mobility and activity tolerance;  Session focused on progressive amb with good progress; pt is still prefering to use a cane over the RW -- emphasized that bilateral UE support increases BOS and allows for more unweighing of painful knees   Recommendations for follow up therapy are one component of a multi-disciplinary discharge planning process, led by the attending physician.  Recommendations may be updated based on patient status, additional functional criteria and insurance authorization.  Follow Up Recommendations  Home health PT     Assistance Recommended at Discharge Intermittent Supervision/Assistance  Patient can return home with the following A little help with walking and/or transfers;A little help with bathing/dressing/bathroom;Assistance with cooking/housework;Help with stairs or ramp for entrance;Assist for transportation   Equipment Recommendations  None recommended by PT    Recommendations for Other Services       Precautions / Restrictions Precautions Precautions: Fall     Mobility  Bed Mobility Overal bed mobility: Needs Assistance Bed Mobility: Supine to Sit     Supine to sit: Min guard     General bed mobility comments: Minguard for safety    Transfers Overall transfer level: Needs assistance Equipment used: Rolling walker (2 wheels) Transfers: Sit to/from Stand Sit to Stand: Min guard           General transfer comment: Cues for hand placement and safety    Ambulation/Gait Ambulation/Gait assistance: Min guard,  Supervision Gait Distance (Feet): 100 Feet Assistive device: Rolling walker (2 wheels) Gait Pattern/deviations: Step-through pattern, Decreased stride length Gait velocity: decreased     General Gait Details: cues for upright posture. Close supervision for safety but no overt LOB.   Stairs             Wheelchair Mobility    Modified Rankin (Stroke Patients Only)       Balance     Sitting balance-[name] Scale: Fair       Standing balance-[name] Scale: Poor                              Cognition Arousal/Alertness: Awake/alert Behavior During Therapy: WFL for tasks assessed/performed Overall Cognitive Status: Within Functional Limits for tasks assessed                                          Exercises      General Comments General comments (skin integrity, edema, etc.): Wife present and helpful      Pertinent Vitals/Pain Pain Assessment Pain Assessment: No/denies pain    Home Living                          Prior Function            PT Goals (current goals can now be found in the care plan section) Acute Rehab PT Goals Patient Stated Goal: to get stronger PT Goal Formulation: With patient/family Time For Goal Achievement: 03/06/2022  Potential to Achieve Goals: Good Progress towards PT goals: Progressing toward goals    Frequency    Min 3X/week      PT Plan Current plan remains appropriate    Co-evaluation              AM-PAC PT "6 Clicks" Mobility   Outcome Measure  Help needed turning from your back to your side while in a flat bed without using bedrails?: A Little Help needed moving from lying on your back to sitting on the side of a flat bed without using bedrails?: A Little Help needed moving to and from a bed to a chair (including a wheelchair)?: A Little Help needed standing up from a chair using your arms (e.g., wheelchair or bedside chair)?: A Little Help needed to walk in hospital  room?: A Little Help needed climbing 3-5 steps with a railing? : A Little 6 Click Score: 18    End of Session Equipment Utilized During Treatment: Gait belt Activity Tolerance: Patient tolerated treatment well Patient left: in chair;with call bell/phone within reach;with family/visitor present Nurse Communication: Mobility status PT Visit Diagnosis: Unsteadiness on feet (R26.81);Muscle weakness (generalized) (M62.81);History of falling (Z91.81)     Time: 6060-0459 PT Time Calculation (min) (ACUTE ONLY): 28 min  Charges:  $Gait Training: 23-37 mins                     Roney Marion, [place] Office [phone]    Colletta [place] 03/04/2022, 5:44 PM

## 2022-03-04 NOTE — Progress Notes (Signed)
PROGRESS NOTE    Ruben [name]  INO:[id number] DOB: 1946-03-01 DOA: 02/27/2022 PCP: [name], MD   Brief Narrative:  76 year old with history of ESRD on HD MWF, chronic CHF with reduced EF, P A-fib, alcohol and tobacco abuse, HTN, HLD, MR not a candidate for mitral clip, depression came to the ED with 1 week of rectal pain.  He was seen by his PCP and sent to [place] for CT scan due to concerns of fistula as he has a history of anal fissure and hemorrhoids followed by Dr. Almyra Free.  He was also recently admitted about a month ago for GI bleed requiring PRBC transfusion and endoscopy showing Barrett's esophagus and angiodysplastic lesion. During this admission for blood transaminitis, right upper quadrant showed cholelithiasis with contracted gallbladder, ascites, fatty liver.  CT showed concerns of cholelithiasis, hepatic cirrhosis and possible proctitis.  Status post MRCP which did not show obvious obstruction, no plans for surgical intervention.  Amiodarone was discontinued.  Cardiology/CHF team was consulted.   Assessment & Plan:  Principal Problem:   Prostatitis Active Problems:   Transaminitis   Cholelithiasis   ESRD on hemodialysis (HCC)   Paroxysmal atrial fibrillation (HCC)   Chronic systolic CHF (congestive heart failure) (HCC)   CAD (coronary artery disease)   Mitral regurgitation   Anxiety and Depression   HLD (hyperlipidemia)   Normocytic anemia   Cirrhosis of liver with ascites (HCC)   Elevated INR     Assessment and Plan: * Prostatitis -Followed by GI.  Mesalamine enema for rectal pain  Transaminitis with fatty infiltration/cirrhosis, elevated total bilirubin, slight improvement Supratherapeutic INR Hx of transminitis in February of 2023 and underwent MRI  liver due to elevated LFTs which showed 12 mm benign lesions.  Does have a history of alcohol use but also patient has severe MR/CHF and on amiodarone but low suspicion. .  Hepatitis  panel is negative.  Received IV vitamin K, INR 1.8.  Defer need for paracentesis to GI.  Transaminitis improving  Cholelithiasis Elevated liver enzymes and bilirutin in setting of cirrhosis.  Negative Murphy sign on gallbladder ultrasound.  MRCP does not show any obvious obstruction, general surgery following, no further surgical intervention  ESRD on hemodialysis ([place]) HD on MWF, HD per nephro  Paroxysmal atrial fibrillation (HCC) Amiodarone discontinued, Eliquis is on hold. Heparin drip gtt.  Appreciate input from cardiology team.  Anemia of chronic disease Thrombocytopenia - Hemoglobin 8.4, which is slightly drifted down from 10.1.  Platelets also continued to drift down to 77.  No obvious evidence of bleeding. Normal folate, elevated ferritin  Elevated AG -Monitor with HD  Chronic systolic CHF (congestive heart failure) ([place]) TEE 06/23: EF 25-30%, RV mildly reduced, severe ischemic MR but overall appears to be euvolemic.  Concern about hepatic congestion, plans to repeat echo  CAD (coronary artery disease) LHC (1/23): Occluded RCA with collaterals, serial 50%/80%/80% mid-distal LCx stenoses, serial 60%/70%/50% proximal to mid LAD stenoses, 70% ostial D1. No good PCI targets and not a CABG candidate.  Chest pain-free  Mitral regurgitation Severe ischemic MR on 6/23 TEE.  Not candidate for Mitraclip per structural heat team due to poor functional capacity and challenging imaging.   Anxiety and Depression Continue zoloft   HLD (hyperlipidemia) Hold statin  Chronic hypoxia - Uses nocturnal 2 L nasal cannula  PT-home health   DVT prophylaxis: Place and maintain sequential compression device Start: 03/01/22 0757Supratherapeutic INR Code Status: Full Family Communication: Wife at bedside  Status is: Inpatient  Remains inpatient appropriate because: Multiple ongoing medical issues, will be in the hospital for atleast 1-2 days.  Final decision per GI and  cardiology    Subjective: Seen and examined at bedside.  No complaints  Examination: Constitutional: Not in acute distress Respiratory: Clear to auscultation bilaterally Cardiovascular: Normal sinus rhythm, no rubs Abdomen: Nontender nondistended good bowel sounds Musculoskeletal: No edema noted Skin: No rashes seen Neurologic: CN 2-12 grossly intact.  And nonfocal Psychiatric: Normal judgment and insight. Alert and oriented x 3. Normal mood.    LUE fistula  Objective: Vitals:   03/03/22 1830 03/03/22 1859 03/03/22 2137 03/04/22 0552  BP: (!) 101/55  (!) 87/50 (!) 95/57  Pulse: 80  74 74  Resp: '18  18 18  '$ Temp: 97.7 F (36.5 C)  97.6 F (36.4 C) 97.7 F (36.5 C)  TempSrc: Oral  Oral Oral  SpO2: 97%  95% 95%  Weight:  68.1 kg    Height:        Intake/Output Summary (Last 24 hours) at 03/04/2022 0807 Last data filed at 03/04/2022 0200 Gross per 24 hour  Intake 120 ml  Output 700 ml  Net -580 ml   Filed Weights   03/01/22 2121 03/03/22 1500 03/03/22 1859  Weight: 68.4 kg 68.7 kg 68.1 kg     Data Reviewed:   CBC: Recent Labs  Lab 02/27/22 2047 03/01/22 0203 03/02/22 0327 03/03/22 0337 03/04/22 0422  WBC 7.2 6.2 7.2 5.2 6.1  NEUTROABS 6.0  --   --   --   --   HGB 10.4* 10.1* 9.4* 8.4* 8.4*  HCT 30.5* 31.1* 28.3* 25.4* 25.4*  MCV 92.4 96.3 95.0 94.8 95.1  PLT 126* 97* 82* 80* 77*   Basic Metabolic Panel: Recent Labs  Lab 03/01/22 0203 03/02/22 0327 03/03/22 0330 03/03/22 0337 03/04/22 0422  NA 136 137 142 138 139  K 4.9 4.0 4.3 4.1 3.4*  CL 92* 95* 100 96* 98  CO2 28 26 21* 24 27  GLUCOSE 114* 108* 104* 108* 116*  BUN 41* 23 37* 36* 21  CREATININE 8.72* 5.79* 7.26* 7.05* 5.26*  CALCIUM 9.7 9.3 9.4 9.3 9.1  MG  --  2.4  --  2.5* 2.1  PHOS  --   --  3.8  --   --    GFR: Estimated Creatinine Clearance: 11.5 mL/min (A) (by C-G formula based on SCr of 5.26 mg/dL (H)). Liver Function Tests: Recent Labs  Lab 02/27/22 2047 02/28/22 1814  03/01/22 0203 03/02/22 0327 03/03/22 0330 03/03/22 0337 03/04/22 0422  AST 231*  --  293* 295*  --  234* 182*  ALT 126*  --  160* 177*  --  145* 133*  ALKPHOS 115  --  111 110  --  100 108  BILITOT 3.0* 3.2* 3.1* 3.6*  --  2.6* 2.4*  PROT 6.9  --  6.2* 6.0*  --  5.4* 5.4*  ALBUMIN 3.7  --  2.8* 2.7* 2.7* 2.5* 2.4*   Recent Labs  Lab 02/28/22 1814  LIPASE 61*   Recent Labs  Lab 03/02/22 1329  AMMONIA 33   Coagulation Profile: Recent Labs  Lab 02/28/22 1814 03/01/22 0203 03/02/22 0327 03/03/22 0337 03/04/22 0422  INR 5.2* 4.6* 3.7* 2.7* 1.8*   Cardiac Enzymes: No results for input(s): "CKTOTAL", "CKMB", "CKMBINDEX", "TROPONINI" in the last 168 hours. BNP (last 3 results) No results for input(s): "PROBNP" in the last 8760 hours. HbA1C: No results for input(s): "HGBA1C" in the last 72 hours. CBG:  Recent Labs  Lab 02/28/22 1348  GLUCAP 87   Lipid Profile: No results for input(s): "CHOL", "HDL", "LDLCALC", "TRIG", "CHOLHDL", "LDLDIRECT" in the last 72 hours. Thyroid Function Tests: Recent Labs    03/03/22 0337  TSH 4.907*  FREET4 0.80  T3FREE 1.0*   Anemia Panel: Recent Labs    03/02/22 0327  FOLATE 9.4  FERRITIN 2,487*  TIBC 263  IRON 109   Sepsis Labs: Recent Labs  Lab 02/27/22 2047 02/28/22 0300  LATICACIDVEN 2.1* 1.9    Recent Results (from the past 240 hour(s))  Blood culture (routine x 2)     Status: None (Preliminary result)   Collection Time: 02/27/22  8:53 PM   Specimen: BLOOD  Result Value Ref Range Status   Specimen Description   Final    BLOOD BLOOD RIGHT WRIST Performed at [facility], [address] [place], [place] [address]    Special Requests   Final    BOTTLES DRAWN AEROBIC AND ANAEROBIC Blood Culture adequate volume Performed at [facility], [address] [place], [place] [address]    Culture   Final    NO GROWTH 4 DAYS Performed at [facility], [place] [address] [place], [place]  [address]    Report Status PENDING  Incomplete  Blood culture (routine x 2)     Status: None (Preliminary result)   Collection Time: 02/27/22  9:06 PM   Specimen: BLOOD  Result Value Ref Range Status   Specimen Description   Final    BLOOD BLOOD RIGHT FOREARM Performed at [facility], [address] [place], [place] [address]    Special Requests   Final    BOTTLES DRAWN AEROBIC AND ANAEROBIC Blood Culture adequate volume Performed at [facility], [address] [place], [place] [address]    Culture   Final    NO GROWTH 4 DAYS Performed at [facility], [place] [address] [place], [place] [address]    Report Status PENDING  Incomplete         Radiology Studies: No results found.      Scheduled Meds:  gabapentin  100 mg Oral QHS   mesalamine  4 g Rectal QHS   midodrine  15 mg Oral TID WC   pantoprazole  40 mg Oral BID   sertraline  25 mg Oral q AM   sodium chloride flush  3 mL Intravenous Q12H   Continuous Infusions:  sodium chloride       LOS: 2 days   Time spent= 35 mins    Moani Weipert Arsenio Loader, MD [facility]  If 7PM-7AM, please contact night-coverage  03/04/2022, 8:07 AM

## 2022-03-04 NOTE — Progress Notes (Signed)
Pt receives out-pt HD at [facility] on MWF. Pt has a 10:40 chair time. Will assist as needed.   Melven Sartorius Renal Navigator [phone]

## 2022-03-04 NOTE — Progress Notes (Addendum)
[name] Progress Note   Subjective: Seen in room, says he feels weak. Wife at bedside. Only 700 cc removed in HD yesterday. Hypotensive, discussed with primary. Giving albumin today.     Objective Vitals:   03/03/22 2137 03/04/22 0552 03/04/22 0908 03/04/22 0943  BP: (!) 87/50 (!) 95/57 (!) 86/55 (!) 86/55  Pulse: 74 74 71 73  Resp: 18 18    Temp: 97.6 F (36.4 C) 97.7 F (36.5 C) 98.1 F (36.7 C) (!) 97.5 F (36.4 C)  TempSrc: Oral Oral Oral Temporal  SpO2: 95% 95% (!) 89% 93%  Weight:      Height:       Physical Exam General: Pt sitting in chair at bedside in NAD Heart: W2/X9 with 2/6 systolic M. SR on monitor.  Lungs: CTAB, no WOB.  Abdomen: protuberant, probable ascites present. NABS.  Extremities:No LE edema Dialysis Access: L AVG + T/B    Additional Objective Labs: Basic Metabolic Panel: Recent Labs  Lab 03/03/22 0330 03/03/22 0337 03/04/22 0422  NA 142 138 139  K 4.3 4.1 3.4*  CL 100 96* 98  CO2 21* 24 27  GLUCOSE 104* 108* 116*  BUN 37* 36* 21  CREATININE 7.26* 7.05* 5.26*  CALCIUM 9.4 9.3 9.1  PHOS 3.8  --   --    Liver Function Tests: Recent Labs  Lab 03/02/22 0327 03/03/22 0330 03/03/22 0337 03/04/22 0422  AST 295*  --  234* 182*  ALT 177*  --  145* 133*  ALKPHOS 110  --  100 108  BILITOT 3.6*  --  2.6* 2.4*  PROT 6.0*  --  5.4* 5.4*  ALBUMIN 2.7* 2.7* 2.5* 2.4*   Recent Labs  Lab 02/28/22 1814  LIPASE 61*   CBC: Recent Labs  Lab 02/27/22 2047 03/01/22 0203 03/02/22 0327 03/03/22 0337 03/04/22 0422  WBC 7.2 6.2 7.2 5.2 6.1  NEUTROABS 6.0  --   --   --   --   HGB 10.4* 10.1* 9.4* 8.4* 8.4*  HCT 30.5* 31.1* 28.3* 25.4* 25.4*  MCV 92.4 96.3 95.0 94.8 95.1  PLT 126* 97* 82* 80* 77*   Blood Culture    Component Value Date/Time   SDES  02/27/2022 2106    BLOOD BLOOD RIGHT FOREARM Performed at [facility], [address] [place], [place] [address]    [facility]  02/27/2022 2106     BOTTLES DRAWN AEROBIC AND ANAEROBIC Blood Culture adequate volume Performed at [facility], [address] [place], [place] [address]    CULT  02/27/2022 2106    NO GROWTH 4 DAYS Performed at [facility], [place] [address] [place], [place] [address]    REPTSTATUS PENDING 02/27/2022 2106    Cardiac Enzymes: No results for input(s): "CKTOTAL", "CKMB", "CKMBINDEX", "TROPONINI" in the last 168 hours. CBG: Recent Labs  Lab 02/28/22 1348  GLUCAP 87   Iron Studies:  Recent Labs    03/02/22 0327  IRON 109  TIBC 263  FERRITIN 2,487*   '@lablastinr3'$ @ Studies/Results: No results found. Medications:  sodium chloride     heparin 600 Units/hr (03/04/22 1114)    gabapentin  100 mg Oral QHS   mesalamine  4 g Rectal QHS   midodrine  15 mg Oral TID WC   pantoprazole  40 mg Oral BID   sertraline  25 mg Oral q AM   sodium chloride flush  3 mL Intravenous Q12H     HD orders:  [facility] MWF 3:45 hrs 180NRe 400/500 70 kg 2.0 K/2.0 Ca UFP 2 AVG -No heparin -Mircera 30 mcg IV q 2 weeks (Last dose 02/24/22)   Assessment/Plan:    # Rectal pain  - felt 2/2 non-infectious prostatitis per charting - per primary team and GI   # Transaminitis with fatty infiltration/cirrhosis - No paracentesis yet, per GI.  - Holding amiodarone with down trend in liver enzymes.  - Per primary/GI   # ESRD - HD per MWF usually  - Plan for HD today here off schedule then resume    Chronic hypotension  - controlled on midodrine  # Volume-now under OP EDW. BP on low side with high dose midodrine. UF as tolerated, attempt to maximize volume on HD. Will need lower EDW on discharge.     Anemia CKD - Hb acceptable at 8.4.  ESA recently administered   Metabolic bone disease  - Check PO4 if able to add to today's labs   Supratherapeutic INR-present on admission. Now in range. Starting heparin per pharmacy.  - per primary team    [place] - Patient has multiple co-morbid conditions. Believe he  would benefit from Palliative Care Consult.   Disposition-per primary  Ruben [name]. [name] NP-C 03/04/2022, 11:29 AM  [employer] [phone]

## 2022-03-04 NOTE — Progress Notes (Signed)
ANTICOAGULATION CONSULT NOTE - Initial Consult  Pharmacy Consult for heparin Indication: atrial fibrillation  Allergies  Allergen Reactions   Other Other (See Comments)    Pollen - sneezing, itching/watery eyes    Patient Measurements: Height: '5\' 10"'$  (177.8 cm) Weight: 68.1 kg (150 lb 2.1 oz) IBW/kg (Calculated) : 73 Heparin Dosing Weight: 68kg  Vital Signs: Temp: 98.1 F (36.7 C) (10/31 0908) Temp Source: Oral (10/31 0908) BP: 86/55 (10/31 0908) Pulse Rate: 71 (10/31 0908)  Labs: Recent Labs    03/02/22 0327 03/03/22 0330 03/03/22 0337 03/04/22 0422  HGB 9.4*  --  8.4* 8.4*  HCT 28.3*  --  25.4* 25.4*  PLT 82*  --  80* 77*  LABPROT 36.0*  --  28.1* 20.6*  INR 3.7*  --  2.7* 1.8*  CREATININE 5.79* 7.26* 7.05* 5.26*    Estimated Creatinine Clearance: 11.5 mL/min (A) (by C-G formula based on SCr of 5.26 mg/dL (H)).   Medical History: Past Medical History:  Diagnosis Date   Arthritis    Chronic kidney disease, stage 3b ([place])    ESRD (end stage renal disease) ([place])    ETOH abuse    History of stress test 09/02/2008   showed inferolateral scar without ischemia   Hx of echocardiogram 09/02/2008   was essentially normal   Hyperlipidemia    Hypertension    Myocardial infarction [facility])    Tobacco abuse     Assessment: 76 year old male with history of afib on apixaban. Patient with elevated LFT's and INR on admission, apixaban was held. Vitamin k was given to help with coagulation. No real correlation with INR and DOACs, INR down to 1.8 today. Discuss with cardiology will start IV heparin until clear of procedures.   LFT's are trending down, ferritin high at 2400. Hemoglobin stable overnight in 8s, plt low but overall stable in upper 70s. No bleeding noted. Will follow aptt along with heparin levels until they correlate d/t recent apixaban use.   Of note, patient has IV availability issues and is also needing albumin so nursing notified pharmacy that heparin may  have to be paused at times.    Goal of Therapy:  Heparin level 0.3-0.7 units/ml aPTT 66-102 seconds Monitor platelets by anticoagulation protocol: Yes   Plan:  Start heparin infusion at 600 units/hr Check anti-Xa level and aptt in 8 hours and daily while on heparin Continue to monitor H&H and platelets  Erin Hearing PharmD., BCPS Clinical Pharmacist 03/04/2022 9:21 AM

## 2022-03-04 NOTE — Progress Notes (Signed)
  Echocardiogram 2D Echocardiogram has been performed.  Ruben [name] 03/04/2022, 2:58 PM

## 2022-03-04 NOTE — Progress Notes (Signed)
Patient Heparin on hold, trying to finish albumin as pt has only one IV and also hemodialysis pt with a restricted arm.  Notified pharmacist and is aware of the situation.

## 2022-03-04 NOTE — TOC Progression Note (Signed)
Transition of Care [facility]) - Progression Note    Patient Details  Name: [name] MRN: [id number] Date of Birth: [birth year]  Transition of Care [facility]) CM/SW Contact  Zenon Mayo, RN Phone Number: 03/04/2022, 2:12 PM  Clinical Narrative:    NCM spoke with patient and wife at the bedside, NCM offered choice with medicare.gov , wife states she will need to call her daughter and ask her, NCM spoke with her she states they had [place] and would like to have them again.  NCM made referral to [facility] with [place] for [place].  She is able to take referral.  Soc will begin 24 to 48 hrs post dc. He has transportation at [employer]. TOC following.    Expected Discharge Plan: [place] Barriers to Discharge: Continued Medical Work up  Expected Discharge Plan and Services Expected Discharge Plan: [place] In-house Referral: NA Discharge Planning Services: CM Consult Post Acute Care Choice: [place] arrangements for the past 2 months: Single Family Home                 DME Arranged: N/A DME Agency: NA       HH Arranged: PT, OT HH Agency: [place] Date [place]: 03/04/22 Time [place]: 1411 Representative spoke with at [place]: [place] ([place]) Interventions    Readmission Risk Interventions    03/04/2022    2:07 PM  Readmission Risk Prevention Plan  Transportation Screening Complete  Medication Review Press photographer) Complete  PCP or Specialist appointment within 3-5 days of discharge Complete  HRI or [place] Not Applicable

## 2022-03-04 NOTE — Progress Notes (Addendum)
Advanced Heart Failure Rounding Note  PCP-Cardiologist: Sanda Klein, MD   Subjective:    No complaints, wife at bedside. Had gas pain yesterday, tolerated HD well. Denies CP and SOB  Objective:   Weight Range: 68.1 kg Body mass index is 21.54 kg/m.   Vital Signs:   Temp:  [97.6 F (36.4 C)-98.2 F (36.8 C)] 97.7 F (36.5 C) (10/31 0552) Pulse Rate:  [74-80] 74 (10/31 0552) Resp:  [16-20] 18 (10/31 0552) BP: (87-105)/(50-65) 95/57 (10/31 0552) SpO2:  [94 %-100 %] 95 % (10/31 0552) Weight:  [68.1 kg-68.7 kg] 68.1 kg (10/30 1859) Last BM Date : 03/03/22  Weight change: Filed Weights   03/01/22 2121 03/03/22 1500 03/03/22 1859  Weight: 68.4 kg 68.7 kg 68.1 kg    Intake/Output:   Intake/Output Summary (Last 24 hours) at 03/04/2022 0852 Last data filed at 03/04/2022 0200 Gross per 24 hour  Intake 120 ml  Output 700 ml  Net -580 ml    Physical Exam  General:  chronically ill appearing.  No respiratory difficulty HEENT: normal Neck: supple. JVD ~7 cm. Carotids 2+ bilat; no bruits. No lymphadenopathy or thyromegaly appreciated. Cor: PMI nondisplaced. Regular rate & rhythm. No rubs, gallops or murmurs. Lungs: clear Abdomen: soft, nontender, nondistended. No hepatosplenomegaly. No bruits or masses. Good bowel sounds. Extremities: no cyanosis, clubbing, rash, edema  Neuro: alert & oriented x 3, cranial nerves grossly intact. moves all 4 extremities w/o difficulty. Affect pleasant.   Telemetry   NSR 70s  EKG    No new EKG to review   Labs    CBC Recent Labs    03/03/22 0337 03/04/22 0422  WBC 5.2 6.1  HGB 8.4* 8.4*  HCT 25.4* 25.4*  MCV 94.8 95.1  PLT 80* 77*   Basic Metabolic Panel Recent Labs    03/03/22 0330 03/03/22 0337 03/04/22 0422  NA 142 138 139  K 4.3 4.1 3.4*  CL 100 96* 98  CO2 21* 24 27  GLUCOSE 104* 108* 116*  BUN 37* 36* 21  CREATININE 7.26* 7.05* 5.26*  CALCIUM 9.4 9.3 9.1  MG  --  2.5* 2.1  PHOS 3.8  --   --     Liver Function Tests Recent Labs    03/03/22 0337 03/04/22 0422  AST 234* 182*  ALT 145* 133*  ALKPHOS 100 108  BILITOT 2.6* 2.4*  PROT 5.4* 5.4*  ALBUMIN 2.5* 2.4*   No results for input(s): "LIPASE", "AMYLASE" in the last 72 hours. Cardiac Enzymes No results for input(s): "CKTOTAL", "CKMB", "CKMBINDEX", "TROPONINI" in the last 72 hours.  BNP: BNP (last 3 results) Recent Labs    06/02/21 0958 06/14/21 0041 02/28/22 1814  BNP 3,498.9* >4,500.0* >4,500.0*    ProBNP (last 3 results) No results for input(s): "PROBNP" in the last 8760 hours.   D-Dimer No results for input(s): "DDIMER" in the last 72 hours. Hemoglobin A1C No results for input(s): "HGBA1C" in the last 72 hours. Fasting Lipid Panel No results for input(s): "CHOL", "HDL", "LDLCALC", "TRIG", "CHOLHDL", "LDLDIRECT" in the last 72 hours. Thyroid Function Tests Recent Labs    03/03/22 0337  TSH 4.907*  T3FREE 1.0*    Other results:   Imaging    No results found.   Medications:     Scheduled Medications:  gabapentin  100 mg Oral QHS   mesalamine  4 g Rectal QHS   midodrine  15 mg Oral TID WC   pantoprazole  40 mg Oral BID   sertraline  25 mg Oral q AM   sodium chloride flush  3 mL Intravenous Q12H    Infusions:  sodium chloride      PRN Medications: sodium chloride, acetaminophen **OR** acetaminophen, guaiFENesin, hydrALAZINE, ipratropium-albuterol, LORazepam, metoprolol tartrate, ondansetron **OR** ondansetron (ZOFRAN) IV, senna-docusate, sodium chloride flush, traZODone    Patient Profile   Ruben [name] 76 y.o. male with history of HTN, HLD, ESRD, PAF, ischemic DCM, CAD, tobacco and alcohol abuse (pint a day of wine formerly), depression. AHF team asked to see for elevated LFTs in the setting of amiodarone use.   Assessment/Plan  Transaminitis, elevated bilirubin, suspected 2/2 amiodarone use - liver MRI 2/23: Benign liver cysts, stable since 2019 CT. 12 mm benign lesions -  Abd MRCP: Mildly nodular hepatic contour, suggesting cirrhosis. Moderate hepatic steatosis. Scattered probable hepatic cysts, measuring up to 14 mm - GI following, additional testing pending - AST 31>231>293>295>234>182 - ALT 33>126>160>177>145>133 - tbili 1.5>3>3.2>3.1>3.6>2.6>2.4 - Previously on amiodarone, last dose 10/28 w/ concern of amiodarone related liver toxicity. All values now downtrending.  - Ok to hold amiodarone. Remains in NSR. Liver enzymes downtrending. No other sequale of amio toxicity noted (normal TSH/T3).  - liver biopsy/paracentesis being considered per GI - INR 1.8, Eliquis currently on hold 2. CAD: Suspect delayed presentation inferior MI in 12/22.  He was not cathed initially due to AKI and delayed presentation. Eventual LHC with occluded RCA (culprit), diffuse moderate disease in LAD and LCx. No good PCI targets. Not a candidate for CABG. No chest pain now.   - Continue atorvastatin - Previously on Eliquis so no ASA.  3. Chronic systolic CHF: Ischemic cardiomyopathy with infarct-related Ruben. Echo in 12/22 with EF 35-40%, inferolateral and basal inferior akinesis, moderate-severe eccentric likely infarct-related Ruben, normal RV. TEE in 1/23 with EF 35-40%, moderate Ruben (suspect infarct-related), aortic dilatation measuring 44 mm, moderate AI. Cardiogenic shock requiring milrinone during 12/22-1/23 hospitalization, titrated off.  Now requiring midodrine to maintain BP with HD. Echo 6/23 with EF 40-45%, RV mildly reduced, moderate to severe ischemic Ruben. TEE in 6/23 with EF 25-30%, RV mildly reduced, severe Ruben.  On exam, he is not volume overloaded. NYHA class III chronically. Appetite and energy have been low, daughter at bedside encouraging eating and ambulation.  - Continue midodrine 15 mg TID - HD for volume management.  - Unable to initiate GDMT due to low BP/ESRD.  - Pending repeat TTE to assess for progression of RVF leading to hepatic congestion 4. Valvular heart disease:   Severe ischemic Ruben on 6/23 TEE.  Not candidate for Mitraclip per structural heart team due to poor functional capacity and challenging imaging.  5. Atrial fibrillation: First noted in 12/22, s/p TEE-guided DCCV in 1/23. NSR today.  - Previously on amiodarone 200 mg daily, continue to hold with elevated enzymes.  - Eliquis on hold with elevated INR, now down to 1.8, will start hep gtt 6. ESRD: Patient has trouble with hypotension with HD.   - Now on midodrine 15 mg TID - nephrology following, volume management w/ HD, had session yesterday 7. Smoker: Quit 04/28/21. 8. ETOH abuse: Last drink was 04/28/21.  9. Rectal pain - Per primary  Length of Stay: [place] AGACNP-BC  03/04/2022, 8:52 AM  Advanced Heart Failure Team Pager [phone] (M-F; 7a - 5p)  Please contact [facility] for night-coverage after hours (5p -7a ) and weekends on [email or web address]  Patient seen with NP, agree with the above note.   LFTs are trending down.  Patient still feels profoundly weak, has not been out of bed today.   General: NAD, thin Neck: JVP 12-14 cm, no thyromegaly or thyroid nodule.  Lungs: Clear to auscultation bilaterally with normal respiratory effort. CV: Nondisplaced PMI.  Heart regular S1/S2, no S3/S4, 2/6 HSM LLSB/apex.  No peripheral edema.   Abdomen: Soft, nontender, no hepatosplenomegaly, mild distention.  Skin: Intact without lesions or rashes.  Neurologic: Alert and oriented x 3.  Psych: Normal affect. Extremities: No clubbing or cyanosis.  HEENT: Normal.   Elevated LFTs may be multifactorial.  He has evidence for cirrhosis on MRI abdomen, has mild ascites on [place].  On exam, he is volume overloaded so I suspect progressive RV failure with congestive hepatopathy plays a significant role.  Amiodarone may also contribute.  - Would leave off amiodarone.  - I think that we need to try to get more fluid off him at HD given contribution from congestive hepatopathy.  - Leave off statin for now,  would like to restart eventually with CAD.   He remains in NSR off amiodarone.  Eliquis on hold, getting heparin gtt for now.   As above, he is volume overloaded on exam.  UF likely limted at HD by low BP (SBP 80s-90s).   - Continue midodrine 15 mg tid.  - As above, try to pull more fluid with HD.   - Suspect we are nearing end stage heart disease with Ruben [name].  Has low EF with severe Ruben, not candidate for LVAD or mitral valve repair (cannot get Mitraclip).  Will repeat echo.   Loralie Champagne 03/04/2022 11:35 AM

## 2022-03-05 ENCOUNTER — Ambulatory Visit (HOSPITAL_COMMUNITY): Payer: Medicare Other

## 2022-03-05 DIAGNOSIS — F064 Anxiety disorder due to known physiological condition: Secondary | ICD-10-CM | POA: Diagnosis not present

## 2022-03-05 DIAGNOSIS — F1010 Alcohol abuse, uncomplicated: Secondary | ICD-10-CM

## 2022-03-05 DIAGNOSIS — I5023 Acute on chronic systolic (congestive) heart failure: Secondary | ICD-10-CM | POA: Diagnosis not present

## 2022-03-05 DIAGNOSIS — E7800 Pure hypercholesterolemia, unspecified: Secondary | ICD-10-CM

## 2022-03-05 DIAGNOSIS — N410 Acute prostatitis: Secondary | ICD-10-CM | POA: Diagnosis not present

## 2022-03-05 DIAGNOSIS — K8020 Calculus of gallbladder without cholecystitis without obstruction: Secondary | ICD-10-CM | POA: Diagnosis not present

## 2022-03-05 LAB — CULTURE, BLOOD (ROUTINE X 2)
Culture: NO GROWTH
Culture: NO GROWTH
Special Requests: ADEQUATE
Special Requests: ADEQUATE

## 2022-03-05 LAB — COMPREHENSIVE METABOLIC PANEL
ALT: 113 U/L — ABNORMAL HIGH (ref 0–44)
AST: 142 U/L — ABNORMAL HIGH (ref 15–41)
Albumin: 3.1 g/dL — ABNORMAL LOW (ref 3.5–5.0)
Alkaline Phosphatase: 98 U/L (ref 38–126)
Anion gap: 17 — ABNORMAL HIGH (ref 5–15)
BUN: 31 mg/dL — ABNORMAL HIGH (ref 8–23)
CO2: 25 mmol/L (ref 22–32)
Calcium: 9.3 mg/dL (ref 8.9–10.3)
Chloride: 95 mmol/L — ABNORMAL LOW (ref 98–111)
Creatinine, Ser: 6.51 mg/dL — ABNORMAL HIGH (ref 0.61–1.24)
GFR, Estimated: 8 mL/min — ABNORMAL LOW (ref >60)
Glucose, Bld: 123 mg/dL — ABNORMAL HIGH (ref 70–99)
Potassium: 3.3 mmol/L — ABNORMAL LOW (ref 3.5–5.1)
Sodium: 137 mmol/L (ref 135–145)
Total Bilirubin: 2.3 mg/dL — ABNORMAL HIGH (ref 0.3–1.2)
Total Protein: 5.8 g/dL — ABNORMAL LOW (ref 6.5–8.1)

## 2022-03-05 LAB — CBC
HCT: 24 % — ABNORMAL LOW (ref 39.0–52.0)
Hemoglobin: 8.3 g/dL — ABNORMAL LOW (ref 13.0–17.0)
MCH: 32.3 pg (ref 26.0–34.0)
MCHC: 34.6 g/dL (ref 30.0–36.0)
MCV: 93.4 fL (ref 80.0–100.0)
Platelets: 71 10*3/uL — ABNORMAL LOW (ref 150–400)
RBC: 2.57 MIL/uL — ABNORMAL LOW (ref 4.22–5.81)
RDW: 23.3 % — ABNORMAL HIGH (ref 11.5–15.5)
WBC: 5.9 10*3/uL (ref 4.0–10.5)
nRBC: 1.5 % — ABNORMAL HIGH (ref 0.0–0.2)

## 2022-03-05 LAB — PROTIME-INR
INR: 1.9 — ABNORMAL HIGH (ref 0.8–1.2)
Prothrombin Time: 21.3 seconds — ABNORMAL HIGH (ref 11.4–15.2)

## 2022-03-05 LAB — APTT: aPTT: 74 seconds — ABNORMAL HIGH (ref 24–36)

## 2022-03-05 LAB — HEPARIN LEVEL (UNFRACTIONATED): Heparin Unfractionated: >1.1 IU/mL — ABNORMAL HIGH (ref 0.30–0.70)

## 2022-03-05 LAB — MAGNESIUM: Magnesium: 2.2 mg/dL (ref 1.7–2.4)

## 2022-03-05 LAB — ALPHA-1-ANTITRYPSIN: A-1 Antitrypsin, Ser: 218 mg/dL — ABNORMAL HIGH (ref 101–187)

## 2022-03-05 MED ORDER — APIXABAN 5 MG PO TABS
5.0000 mg | ORAL_TABLET | Freq: Two times a day (BID) | ORAL | Status: DC
  Administered 2022-03-05 – 2022-03-06 (×3): 5 mg via ORAL
  Filled 2022-03-05 (×3): qty 1

## 2022-03-05 NOTE — Progress Notes (Signed)
Progress Note   Patient: Ruben [name] CLE:[id number] DOB: [birth year] DOA: 02/27/2022     3 DOS: the patient was seen and examined on 03/05/2022   Brief hospital course: Ruben [name] was admitted to the hospital with the working diagnosis of prostatitis.   76 yo male with the past medical history of ESRD on HD, heart failure, paroxysmal atrial fibrillation, hypertension, alcohol abuse, mitral regurgitation sp mitral clip, and depression who presented with rectal pain. Reported one week of rectal pain. No fevers or chills. On his initial physical examination his blood pressure was 94/58, HR 99, RR 16 and 02 saturation 93% on room air, lungs with no wheezing or rales, heart with S1 and S2 present and rhythmic, abdomen with mild distention but not tender, no lower extremity edema.    During this admission for blood transaminitis, right upper quadrant showed cholelithiasis with contracted gallbladder, ascites, fatty liver.  CT showed concerns of cholelithiasis, hepatic cirrhosis and possible proctitis.  Status post MRCP which did not show obvious obstruction, no plans for surgical intervention.  Amiodarone was discontinued.  Cardiology/CHF team was consulted.   Assessment and Plan: * Prostatitis Clinically improving, continue with symptomatic treatment. No indication for antibiotic therapy   Transaminitis Hx of transminitis in February of 2023 and underwent MRI  liver due to elevated LFTs which showed 12 mm benign lesions.  -has chronic CHF and severe mitral regurg that could be contributing  AST and ALT are trending down, continue with supportive therapy Follow up as outpatient.   Cholelithiasis Elevated liver enzymes and bilirutin in setting of cirrhosis. Negative Murphy sign but has stones on imaging and a contracted gall bladder No other systemic signs of infection. Do not feel that abx warranted at this time.   MRCP with no obstruction No further work up at this point  inpatient. Plan to follow up as outpatient.   ESRD on hemodialysis [facility]) Patient had renal replacement therapy today with no complications.  Clinically euvolemic.   Paroxysmal atrial fibrillation (HCC) Patient remains sinus rhythm off amiodarone Continue anticoagulation with apixaban.   Chronic systolic CHF (congestive heart failure) ([place]) TEE 06/23: EF 25-30%, RV mildly reduced, severe ischemic MR  Continue midodrine for blood pressure support.  Moderate to severe MR. Not candidate for mitral valve repair.   CAD (coronary artery disease) LHC (1/23): Occluded RCA with collaterals, serial 50%/80%/80% mid-distal LCx stenoses, serial 60%/70%/50% proximal to mid LAD stenoses, 70% ostial D1. No good PCI targets and not a CABG candidate.   Mitral regurgitation Severe ischemic MR on 6/23 TEE.  Not candidate for Mitraclip per structural heat team due to poor functional capacity and challenging imaging.   Anxiety and Depression Continue zoloft   HLD (hyperlipidemia) Hold statin since LFT >3x normal limit   Normocytic anemia Likely some ACD from CKD Stable Continue to follow         Subjective: Patient with no chest pain or dyspnea, tolerated HD well, he had transitory epistaxis today   Physical Exam: Vitals:   03/05/22 1249 03/05/22 1259 03/05/22 1404 03/05/22 1610  BP: 99/62  (!) 98/57 (!) 89/50  Pulse: 86 84 81 85  Resp: '18 15 14 20  '$ Temp: 97.6 F (36.4 C)  97.7 F (36.5 C) (!) 97.4 F (36.3 C)  TempSrc: Oral  Oral Oral  SpO2: 98% 99% 98% 100%  Weight:      Height:       Neurology awake and alert ENT with no pallor Cardiovascular with S1 and S2  present and rhythmic, positive systolic murmur at the apex with no gallops Respiratory with no rales or wheezing Abdomen with no distention No lower extremity edema  Data Reviewed:    Family Communication: no family at the bedside   Disposition: Status is: Inpatient Remains inpatient appropriate because: possible  dc home tomorrow   Planned Discharge Destination: Home    Author: Tawni Millers, MD 03/05/2022 4:37 PM  For on call review [email or web address].

## 2022-03-05 NOTE — Progress Notes (Addendum)
Advanced Heart Failure Rounding Note  PCP-Cardiologist: Sanda Klein, MD   Subjective:    Complaining of fatigue.  Objective:   Weight Range: 68.1 kg Body mass index is 21.54 kg/m.   Vital Signs:   Temp:  [97.3 F (36.3 C)-98.1 F (36.7 C)] 97.9 F (36.6 C) (11/01 0819) Pulse Rate:  [71-82] 82 (11/01 0819) Resp:  [17-18] 17 (11/01 0819) BP: (86-96)/(55-60) 91/60 (11/01 0819) SpO2:  [89 %-99 %] 91 % (11/01 0819) Last BM Date : 03/05/22  Weight change: Filed Weights   03/01/22 2121 03/03/22 1500 03/03/22 1859  Weight: 68.4 kg 68.7 kg 68.1 kg    Intake/Output:   Intake/Output Summary (Last 24 hours) at 03/05/2022 0834 Last data filed at 03/05/2022 0622 Gross per 24 hour  Intake 335.47 ml  Output --  Net 335.47 ml    Physical Exam  General:  Well appearing. No resp difficulty HEENT: normal Neck: supple. no JVD. Carotids 2+ bilat; no bruits. No lymphadenopathy or thryomegaly appreciated. Cor: PMI nondisplaced. Regular rate & rhythm. No rubs, gallops or murmurs. Lungs: deacreased breath sounds in the bases Abdomen: soft, nontender, nondistended. No hepatosplenomegaly. No bruits or masses. Good bowel sounds. Extremities: no cyanosis, clubbing, rash, edema. LUE AVF Neuro: alert & orientedx3, cranial nerves grossly intact. moves all 4 extremities w/o difficulty. Affect pleasant  Telemetry   NSR 70s  EKG    No new EKG to review   Labs    CBC Recent Labs    03/04/22 0422 03/05/22 0257  WBC 6.1 5.9  HGB 8.4* 8.3*  HCT 25.4* 24.0*  MCV 95.1 93.4  PLT 77* 71*   Basic Metabolic Panel Recent Labs    03/03/22 0330 03/03/22 0337 03/04/22 0422 03/05/22 0257  NA 142   < > 139 137  K 4.3   < > 3.4* 3.3*  CL 100   < > 98 95*  CO2 21*   < > 27 25  GLUCOSE 104*   < > 116* 123*  BUN 37*   < > 21 31*  CREATININE 7.26*   < > 5.26* 6.51*  CALCIUM 9.4   < > 9.1 9.3  MG  --    < > 2.1 2.2  PHOS 3.8  --   --   --    < > = values in this interval not  displayed.   Liver Function Tests Recent Labs    03/04/22 0422 03/05/22 0257  AST 182* 142*  ALT 133* 113*  ALKPHOS 108 98  BILITOT 2.4* 2.3*  PROT 5.4* 5.8*  ALBUMIN 2.4* 3.1*   No results for input(s): "LIPASE", "AMYLASE" in the last 72 hours. Cardiac Enzymes No results for input(s): "CKTOTAL", "CKMB", "CKMBINDEX", "TROPONINI" in the last 72 hours.  BNP: BNP (last 3 results) Recent Labs    06/02/21 0958 06/14/21 0041 02/28/22 1814  BNP 3,498.9* >4,500.0* >4,500.0*    ProBNP (last 3 results) No results for input(s): "PROBNP" in the last 8760 hours.   D-Dimer No results for input(s): "DDIMER" in the last 72 hours. Hemoglobin A1C No results for input(s): "HGBA1C" in the last 72 hours. Fasting Lipid Panel No results for input(s): "CHOL", "HDL", "LDLCALC", "TRIG", "CHOLHDL", "LDLDIRECT" in the last 72 hours. Thyroid Function Tests Recent Labs    03/03/22 0337  TSH 4.907*  T3FREE 1.0*    Other results:   Imaging    ECHOCARDIOGRAM LIMITED  Result Date: 03/04/2022    ECHOCARDIOGRAM LIMITED REPORT   Patient Name:   Ruben [name] Date of Exam: 03/04/2022 Medical Rec #:  [id number]             Height:       70.0 in Accession #:    [id number]            Weight:       150.1 lb Date of Birth:  08/04/1945             BSA:          1.848 m Patient Age:    76 years              BP:           86/55 mmHg Patient Gender: M                     HR:           76 bpm. Exam Location:  Inpatient Procedure: Limited Echo, Color Doppler and Cardiac Doppler Indications:    ischemic cardiomyopathy  History:        Patient has prior history of Echocardiogram examinations, most                 recent 10/31/2021. CHF, CAD, end stage renal disease. Cirrhosis.;                 Risk Factors:Dyslipidemia.  Sonographer:    Johny Chess RDCS Referring Phys: [phone] ADITYA [place]  1. Left ventricular ejection fraction, by estimation, is 25 to 30%. The left ventricle has  severely decreased function. The left ventricle demonstrates global hypokinesis. Left ventricular diastolic parameters are indeterminate.  2. Large pleural effusion in both left and right lateral regions.  3. Moderate to severe mitral valve regurgitation. MR is functional. The mean mitral valve gradient is 2.0 mmHg. TVI tips fo the mitral leaflet to the LVOT is 0.88.  4. Tricuspid valve regurgitation is moderate.  5. Aortic valve regurgitation is mild to moderate. PHT likely overestimates the degree of AI.  6. Aortic dilatation noted. There is mild dilatation of the ascending aorta, measuring 40 mm.  7. There is mildly elevated pulmonary artery systolic pressure.  8. The inferior vena cava is dilated in size with <50% respiratory variability, suggesting right atrial pressure of 15 mmHg. Comparison(s): Compared to last images (TEE 10/31/21), slight improvement valvular disease. FINDINGS  Left Ventricle: Left ventricular ejection fraction, by estimation, is 25 to 30%. The left ventricle has severely decreased function. The left ventricle demonstrates global hypokinesis. The left ventricular internal cavity size was normal in size. There is no left ventricular hypertrophy. Left ventricular diastolic parameters are indeterminate. Right Ventricle: There is mildly elevated pulmonary artery systolic pressure. The tricuspid regurgitant velocity is 2.59 m/s, and with an assumed right atrial pressure of 15 mmHg, the estimated right ventricular systolic pressure is 11.6 mmHg. Pericardium: Trivial pericardial effusion is present. The pericardial effusion is circumferential. Mitral Valve: Moderate to severe mitral valve regurgitation. The mean mitral valve gradient is 2.0 mmHg. Tricuspid Valve: Tricuspid valve regurgitation is moderate. Aortic Valve: Aortic valve regurgitation is mild to moderate. Aortic regurgitation PHT measures 280 msec. Pulmonic Valve: The pulmonic valve was normal in structure. Pulmonic valve regurgitation  is mild. No evidence of pulmonic stenosis. Aorta: Aortic dilatation noted. There is mild dilatation of the ascending aorta, measuring 40 mm. Venous: The inferior vena cava is dilated in size with less than 50% respiratory variability, suggesting right atrial pressure of 15 mmHg. Additional Comments: There is a large  pleural effusion in both left and right lateral regions. LEFT VENTRICLE PLAX 2D LVIDd:         5.40 cm   Diastology LVIDs:         4.70 cm   LV e' medial:  6.74 cm/s LV PW:         1.00 cm   LV e' lateral: 6.96 cm/s LV IVS:        1.00 cm LVOT diam:     1.80 cm LV SV:         46 LV SV Index:   25 LVOT Area:     2.54 cm  IVC IVC diam: 2.30 cm LEFT ATRIUM         Index LA diam:    4.30 cm 2.33 cm/m  AORTIC VALVE LVOT Vmax:   103.00 cm/s LVOT Vmean:  66.300 cm/s LVOT VTI:    0.181 m AI PHT:      280 msec  AORTA Ao Asc diam: 4.00 cm MITRAL VALVE           TRICUSPID VALVE MV Mean grad: 2.0 mmHg TR Peak grad:   26.8 mmHg                        TR Vmax:        259.00 cm/s                         SHUNTS                        Systemic VTI:  0.18 m                        Systemic Diam: 1.80 cm Ruben Haskell MD Electronically signed by Ruben Haskell MD Signature Date/Time: 03/04/2022/3:06:12 PM    Final      Medications:     Scheduled Medications:  Chlorhexidine Gluconate Cloth  6 each Topical Q0600   gabapentin  100 mg Oral QHS   mesalamine  4 g Rectal QHS   midodrine  15 mg Oral TID WC   pantoprazole  40 mg Oral BID   sertraline  25 mg Oral q AM   sodium chloride flush  3 mL Intravenous Q12H    Infusions:  sodium chloride     heparin 600 Units/hr (03/04/22 1840)    PRN Medications: sodium chloride, acetaminophen **OR** acetaminophen, guaiFENesin, hydrALAZINE, ipratropium-albuterol, LORazepam, metoprolol tartrate, ondansetron **OR** ondansetron (ZOFRAN) IV, senna-docusate, sodium chloride flush, traZODone    Patient Profile   Ruben [name] 76 y.o. male with history of  HTN, HLD, ESRD, PAF, ischemic DCM, CAD, tobacco and alcohol abuse (pint a day of wine formerly), depression. AHF team asked to see for elevated LFTs in the setting of amiodarone use.   Assessment/Plan  Transaminitis, elevated bilirubin, suspected 2/2 amiodarone use - liver MRI 2/23: Benign liver cysts, stable since 2019 CT. 12 mm benign lesions - Abd MRCP: Mildly nodular hepatic contour, suggesting cirrhosis. Moderate hepatic steatosis. Scattered probable hepatic cysts, measuring up to 14 mm - GI following, additional testing pending - AST 31>231>293>295>234>182>142 - ALT 33>126>160>177>145>133>113 - tbili 1.5>3>3.2>3.1>3.6>2.6>2.4>2.3  - Previously on amiodarone, last dose 10/28 w/ concern of amiodarone related liver toxicity.  - Ok to hold amiodarone.  -. Liver enzymes downtrending. No other sequale of amio toxicity noted (normal TSH/T3).  - liver biopsy/paracentesis being considered per GI - INR 1.9,  Eliquis currently on hold until work up completed  2. CAD: Suspect delayed presentation inferior MI in 12/22.  He was not cathed initially due to AKI and delayed presentation. Eventual LHC with occluded RCA (culprit), diffuse moderate disease in LAD and LCx. No good PCI targets. Not a candidate for CABG.  - no chest pain.    - Continue atorvastatin - Previously on Eliquis so no ASA.  3. Chronic systolic CHF: Ischemic cardiomyopathy with infarct-related MR. Echo in 12/22 with EF 35-40%, inferolateral and basal inferior akinesis, moderate-severe eccentric likely infarct-related MR, normal RV. TEE in 1/23 with EF 35-40%, moderate MR (suspect infarct-related), aortic dilatation measuring 44 mm, moderate AI. Cardiogenic shock requiring milrinone during 12/22-1/23 hospitalization, titrated off.  Now requiring midodrine to maintain BP with HD. Echo 6/23 with EF 40-45%, RV mildly reduced, moderate to severe ischemic MR. TEE in 6/23 with EF 25-30%, RV mildly reduced, severe MR.  On exam, he is not volume  overloaded. NYHA class III chronically.   - Continue midodrine 15 mg TID - HD for volume management.  - Unable to initiate GDMT due to low BP/ESRD.  - Echo results pending. Possible repeat TEE to assess for progression of RVF leading to hepatic congestion 4. Valvular heart disease:  Severe ischemic MR on 6/23 TEE.  Not candidate for Mitraclip per structural heart team due to poor functional capacity and challenging imaging.  5. Atrial fibrillation: First noted in 12/22, s/p TEE-guided DCCV in 1/23. I SR  - Previously on amiodarone daily, continue to hold with elevated enzymes.  - Eliquis on hold . INR 1.9. On  hep gtt. Switch back to eliquis once work up completed.  6. ESRD: Patient has trouble with hypotension with HD.   - Now on midodrine 15 mg TID - nephrology following, volume management w/ HD, had session yesterday 7. Smoker: Quit 04/28/21. 8. ETOH abuse: Last drink was 04/28/21.  9. Rectal pain - Per primary 10. MR Functional. ECHO read pending.   Length of Stay: 3  Amy Clegg NP-C  03/05/2022, 8:34 AM  Advanced Heart Failure Team Pager [phone] (M-F; 7a - 5p)  Please contact [facility] for night-coverage after hours (5p -7a ) and weekends on [email or web address]  Patient seen with NP, agree with the above note.   Weak in general. LFTs continue to trend down.  SBP 90s on midodrine.   Echo showed E 25-30%, global hypokinesis, mod-severe functional MR, moderate TR, IVC dilated.   General: Frail Neck: JVP 12-14 cm, no thyromegaly or thyroid nodule.  Lungs: Clear to auscultation bilaterally with normal respiratory effort. CV: Nondisplaced PMI.  Heart regular S1/S2, no S3/S4, no murmur.  No peripheral edema.   Abdomen: Soft, nontender, no hepatosplenomegaly, no distention.  Skin: Intact without lesions or rashes.  Neurologic: Alert and oriented x 3.  Psych: Normal affect. Extremities: No clubbing or cyanosis.  HEENT: Normal.   Elevated LFTs may be multifactorial.  He has  evidence for cirrhosis on MRI abdomen, has mild ascites on [place].  On exam, he is volume overloaded so I suspect progressive RV failure with congestive hepatopathy plays a significant role.  Amiodarone may also contribute.  - Would leave off amiodarone.  - Need to get more fluid off him at HD if at all possible given contribution from congestive hepatopathy though hypotension will likely make this difficult.  - Leave off statin for now, would like to restart eventually with CAD.    He remains in NSR off amiodarone.  Eliquis on hold,  getting heparin gtt for now. Can resume Eliquis from my standpoint.    As above, he is volume overloaded on exam.  UF likely limited at HD by low BP (SBP 90s today).   - Continue midodrine 15 mg tid.  - As above, try to pull more fluid with HD.   - Suspect we are nearing end stage heart disease with Mr Hagood.  Has low EF with moderate-severe MR, not candidate for LVAD or mitral valve repair (cannot get Mitraclip).   Loralie Champagne 03/05/2022 9:08 AM

## 2022-03-05 NOTE — Progress Notes (Signed)
[facility] Progress Note   Subjective: Seen on HD. No C/O. Continued issues with hypotension despite high dose midodrine HF following. GI has signed off.   Objective Vitals:   03/05/22 0819 03/05/22 0830 03/05/22 0853 03/05/22 0910  BP: 91/60 (!) 89/56 (!) 92/57 (!) 91/56  Pulse: 82 80 79 78  Resp: '17 14 12 17  '$ Temp: 97.9 F (36.6 C) 97.6 F (36.4 C)    TempSrc: Oral Oral    SpO2: 91% 92% 97% 97%  Weight:  73.1 kg    Height:       Physical Exam General: Pt sitting in chair at bedside in NAD Heart: Y6/Z9 with 2/6 systolic M. SR on monitor.  Lungs: CTAB, no WOB.  Abdomen: protuberant, probable ascites present. NABS.  Extremities:No LE edema Dialysis Access: L AVG cannulated   Additional Objective Labs: Basic Metabolic Panel: Recent Labs  Lab 03/03/22 0330 03/03/22 0337 03/04/22 0422 03/05/22 0257  NA 142 138 139 137  K 4.3 4.1 3.4* 3.3*  CL 100 96* 98 95*  CO2 21* '24 27 25  '$ GLUCOSE 104* 108* 116* 123*  BUN 37* 36* 21 31*  CREATININE 7.26* 7.05* 5.26* 6.51*  CALCIUM 9.4 9.3 9.1 9.3  PHOS 3.8  --   --   --    Liver Function Tests: Recent Labs  Lab 03/03/22 0337 03/04/22 0422 03/05/22 0257  AST 234* 182* 142*  ALT 145* 133* 113*  ALKPHOS 100 108 98  BILITOT 2.6* 2.4* 2.3*  PROT 5.4* 5.4* 5.8*  ALBUMIN 2.5* 2.4* 3.1*   Recent Labs  Lab 02/28/22 1814  LIPASE 61*   CBC: Recent Labs  Lab 02/27/22 2047 03/01/22 0203 03/02/22 0327 03/03/22 0337 03/04/22 0422 03/05/22 0257  WBC 7.2 6.2 7.2 5.2 6.1 5.9  NEUTROABS 6.0  --   --   --   --   --   HGB 10.4* 10.1* 9.4* 8.4* 8.4* 8.3*  HCT 30.5* 31.1* 28.3* 25.4* 25.4* 24.0*  MCV 92.4 96.3 95.0 94.8 95.1 93.4  PLT 126* 97* 82* 80* 77* 71*   Blood Culture    Component Value Date/Time   SDES  02/27/2022 2106    BLOOD BLOOD RIGHT FOREARM Performed at [facility], [address], [place], [place] [address]    [facility]  02/27/2022 2106    BOTTLES DRAWN AEROBIC AND  ANAEROBIC Blood Culture adequate volume Performed at [facility], [address], [place], [place] [address]    CULT  02/27/2022 2106    NO GROWTH 4 DAYS Performed at [facility], [place] [address] [place], [place] [address]    REPTSTATUS PENDING 02/27/2022 2106    Cardiac Enzymes: No results for input(s): "CKTOTAL", "CKMB", "CKMBINDEX", "TROPONINI" in the last 168 hours. CBG: Recent Labs  Lab 02/28/22 1348  GLUCAP 87   Iron Studies: No results for input(s): "IRON", "TIBC", "TRANSFERRIN", "FERRITIN" in the last 72 hours. '@lablastinr3'$ @ Studies/Results: ECHOCARDIOGRAM LIMITED  Result Date: 03/04/2022    ECHOCARDIOGRAM LIMITED REPORT   Patient Name:   Ruben [name] [facility] Date of Exam: 03/04/2022 Medical Rec #:  [id number]             Height:       70.0 in Accession #:    [id number]            Weight:       150.1 lb Date of Birth:  06/30/1945  BSA:          1.848 m Patient Age:    76 years              BP:           86/55 mmHg Patient Gender: M                     HR:           76 bpm. Exam Location:  Inpatient Procedure: Limited Echo, Color Doppler and Cardiac Doppler Indications:    ischemic cardiomyopathy  History:        Patient has prior history of Echocardiogram examinations, most                 recent 10/31/2021. CHF, CAD, end stage renal disease. Cirrhosis.;                 Risk Factors:Dyslipidemia.  Sonographer:    Johny Chess RDCS Referring Phys: [phone] ADITYA [place]  1. Left ventricular ejection fraction, by estimation, is 25 to 30%. The left ventricle has severely decreased function. The left ventricle demonstrates global hypokinesis. Left ventricular diastolic parameters are indeterminate.  2. Large pleural effusion in both left and right lateral regions.  3. Moderate to severe mitral valve regurgitation. MR is functional. The mean mitral valve gradient is 2.0 mmHg. TVI tips fo the mitral leaflet to the LVOT is 0.88.  4. Tricuspid  valve regurgitation is moderate.  5. Aortic valve regurgitation is mild to moderate. PHT likely overestimates the degree of AI.  6. Aortic dilatation noted. There is mild dilatation of the ascending aorta, measuring 40 mm.  7. There is mildly elevated pulmonary artery systolic pressure.  8. The inferior vena cava is dilated in size with <50% respiratory variability, suggesting right atrial pressure of 15 mmHg. Comparison(s): Compared to last images (TEE 10/31/21), slight improvement valvular disease. FINDINGS  Left Ventricle: Left ventricular ejection fraction, by estimation, is 25 to 30%. The left ventricle has severely decreased function. The left ventricle demonstrates global hypokinesis. The left ventricular internal cavity size was normal in size. There is no left ventricular hypertrophy. Left ventricular diastolic parameters are indeterminate. Right Ventricle: There is mildly elevated pulmonary artery systolic pressure. The tricuspid regurgitant velocity is 2.59 m/s, and with an assumed right atrial pressure of 15 mmHg, the estimated right ventricular systolic pressure is 41.3 mmHg. Pericardium: Trivial pericardial effusion is present. The pericardial effusion is circumferential. Mitral Valve: Moderate to severe mitral valve regurgitation. The mean mitral valve gradient is 2.0 mmHg. Tricuspid Valve: Tricuspid valve regurgitation is moderate. Aortic Valve: Aortic valve regurgitation is mild to moderate. Aortic regurgitation PHT measures 280 msec. Pulmonic Valve: The pulmonic valve was normal in structure. Pulmonic valve regurgitation is mild. No evidence of pulmonic stenosis. Aorta: Aortic dilatation noted. There is mild dilatation of the ascending aorta, measuring 40 mm. Venous: The inferior vena cava is dilated in size with less than 50% respiratory variability, suggesting right atrial pressure of 15 mmHg. Additional Comments: There is a large pleural effusion in both left and right lateral regions. LEFT  VENTRICLE PLAX 2D LVIDd:         5.40 cm   Diastology LVIDs:         4.70 cm   LV e' medial:  6.74 cm/s LV PW:         1.00 cm   LV e' lateral: 6.96 cm/s LV IVS:        1.00  cm LVOT diam:     1.80 cm LV SV:         46 LV SV Index:   25 LVOT Area:     2.54 cm  IVC IVC diam: 2.30 cm LEFT ATRIUM         Index LA diam:    4.30 cm 2.33 cm/m  AORTIC VALVE LVOT Vmax:   103.00 cm/s LVOT Vmean:  66.300 cm/s LVOT VTI:    0.181 m AI PHT:      280 msec  AORTA Ao Asc diam: 4.00 cm MITRAL VALVE           TRICUSPID VALVE MV Mean grad: 2.0 mmHg TR Peak grad:   26.8 mmHg                        TR Vmax:        259.00 cm/s                         SHUNTS                        Systemic VTI:  0.18 m                        Systemic Diam: 1.80 cm Rudean Haskell MD Electronically signed by Rudean Haskell MD Signature Date/Time: 03/04/2022/3:06:12 PM    Final    Medications:  sodium chloride     heparin 600 Units/hr (03/04/22 1840)    Chlorhexidine Gluconate Cloth  6 each Topical Q0600   gabapentin  100 mg Oral QHS   mesalamine  4 g Rectal QHS   midodrine  15 mg Oral TID WC   pantoprazole  40 mg Oral BID   sertraline  25 mg Oral q AM   sodium chloride flush  3 mL Intravenous Q12H     HD orders: [facility] MWF 3:45 hrs 180NRe 400/500 70 kg 2.0 K/2.0 Ca UFP 2 AVG -No heparin -Mircera 30 mcg IV q 2 weeks (Last dose 02/24/22)   Assessment/Plan:    # Rectal pain  - felt 2/2 non-infectious prostatitis per charting - per primary team and GI   # Transaminitis with fatty infiltration/cirrhosis - No paracentesis per GI.  - Holding amiodarone with down trend in liver enzymes however GI believes elevation of enzymes if multifactorial, probably related to HF.  - Per primary/GI signed off.    # ESRD - HD per MWF   - Next HD 03/07/2022   # Chronic hypotension  - controlled on midodrine   # Volume-now under OP EDW. BP on low side with high dose midodrine. UF as tolerated, attempt to maximize volume on HD. Will  need lower EDW on discharge.     # Anemia CKD - Hb acceptable at 8.4.  ESA recently administered   # Metabolic bone disease  - Check PO4 if able to add to today's labs   # Supratherapeutic INR-present on admission. Now in range. Starting heparin per pharmacy.  - per primary team    # [place] - Patient has multiple co-morbid conditions. Believe he would benefit from Palliative Care Consult.    Disposition-per primary  Jimmye Norman. Leathia Farnell NP-C 03/05/2022, 9:26 AM  [employer] [phone]

## 2022-03-05 NOTE — Care Management Important Message (Signed)
Important Message  Patient Details  Name: Ruben [name] MRN: [id number] Date of Birth: June 07, 1945   Medicare Important Message Given:  Yes     [name] 03/05/2022, 2:37 PM

## 2022-03-05 NOTE — Progress Notes (Signed)
Received patient in bed to unit.  Alert and oriented.  Informed consent signed and in chart.   Treatment initiated: 3968 Treatment completed: 1249  Patient tolerated well.  Transported back to the room  Alert, without acute distress.  Hand-off given to patient's nurse.   Access used: fistula Access issues: none  Total UF removed: 900 Medication(s) given: Midodrine Post HD VS: 97.6, 99/62(74), HR-86, RR-18, SP02-98 Post HD weight: 72.2kg   [facility]

## 2022-03-05 NOTE — Progress Notes (Signed)
OT Cancellation Note  Patient Details Name: Ruben [name] MRN: [id number] DOB: 05-27-1945   Cancelled Treatment:    Reason Eval/Treat Not Completed: Patient at procedure or test/ unavailable.  At HD.    Karmen Altamirano D Blessed Girdner 03/05/2022, 4:31 PM 03/05/2022  RP, OTR/L  Acute Rehabilitation Services  Office:  [phone]

## 2022-03-05 NOTE — Progress Notes (Signed)
ANTICOAGULATION CONSULT NOTE - Follow Up Consult  Pharmacy Consult for heparin Indication: atrial fibrillation  Labs: Recent Labs    03/03/22 0337 03/04/22 0422 03/05/22 0257  HGB 8.4* 8.4* 8.3*  HCT 25.4* 25.4* 24.0*  PLT 80* 77* 71*  APTT  --   --  74*  LABPROT 28.1* 20.6* 21.3*  INR 2.7* 1.8* 1.9*  CREATININE 7.05* 5.26* 6.51*    Assessment/Plan:  76yo male therapeutic on heparin with initial dosing while Eliquis on hold. Will continue infusion at current rate of 600 units/hr and confirm stable with additional PTT.   Wynona Neat, PharmD, BCPS  03/05/2022,3:49 AM

## 2022-03-05 NOTE — Hospital Course (Signed)
Mr. Myhand was admitted to the hospital with the working diagnosis of prostatitis.   76 yo male with the past medical history of ESRD on HD, heart failure, paroxysmal atrial fibrillation, hypertension, alcohol abuse, mitral regurgitation sp mitral clip, and depression who presented with rectal pain. Reported one week of rectal pain. No fevers or chills. On his initial physical examination his blood pressure was 94/58, HR 99, RR 16 and 02 saturation 93% on room air, lungs with no wheezing or rales, heart with S1 and S2 present and rhythmic, abdomen with mild distention but not tender, no lower extremity edema.    During this admission for blood transaminitis, right upper quadrant showed cholelithiasis with contracted gallbladder, ascites, fatty liver.  CT showed concerns of cholelithiasis, hepatic cirrhosis and possible proctitis.  Status post MRCP which did not show obvious obstruction, no plans for surgical intervention.  Amiodarone was discontinued.  Cardiology/CHF team was consulted.

## 2022-03-05 NOTE — Progress Notes (Signed)
Physical Therapy Treatment Patient Details Name: Ruben [name] MRN: [id number] DOB: November 03, 1945 Today's Date: 03/05/2022   History of Present Illness 76 y/o male presented to ED on 02/27/22 for hemorrhoid check to rule out rectal abscess. CT showed rectal wall thickening and cirrhosis. PMH: proctitis, CHF, severe mitral regurgitation, cirrhosis, hx of ETOH abuse, PAF, hx of MI    PT Comments    Patient with nosebleed on arrival with difficulty stopping despite cold compress and compression. Once manageable, able to ambulate in hallway with supervision and RW. Required standing rest break halfway due to fatigue. Reports increased fatigue after HD. D/c plan remains appropriate.     Recommendations for follow up therapy are one component of a multi-disciplinary discharge planning process, led by the attending physician.  Recommendations may be updated based on patient status, additional functional criteria and insurance authorization.  Follow Up Recommendations  Home health PT     Assistance Recommended at Discharge Intermittent Supervision/Assistance  Patient can return home with the following A little help with walking and/or transfers;A little help with bathing/dressing/bathroom;Assistance with cooking/housework;Help with stairs or ramp for entrance;Assist for transportation   Equipment Recommendations  None recommended by PT    Recommendations for Other Services       Precautions / Restrictions Precautions Precautions: Fall Restrictions Weight Bearing Restrictions: No     Mobility  Bed Mobility               General bed mobility comments: in recliner on arrival    Transfers Overall transfer level: Needs assistance Equipment used: Rolling [name] (2 wheels) Transfers: Sit to/from Stand Sit to Stand: Supervision           General transfer comment: supervision for safety    Ambulation/Gait Ambulation/Gait assistance: Supervision Gait Distance (Feet):  50 Feet (+50') Assistive device: Rolling [name] (2 wheels) Gait Pattern/deviations: Step-through pattern, Decreased stride length Gait velocity: decreased     General Gait Details: supervision for safety. Standing rest break at halfway. Cues for upright postrue   Stairs             Wheelchair Mobility    Modified Rankin (Stroke Patients Only)       Balance Overall balance assessment: Needs assistance Sitting-balance support: Feet supported, No upper extremity supported Sitting balance-[name] Scale: Fair     Standing balance support: During functional activity, Reliant on assistive device for balance Standing balance-[name] Scale: Poor Standing balance comment: reliant on RW                            Cognition Arousal/Alertness: Awake/alert Behavior During Therapy: WFL for tasks assessed/performed Overall Cognitive Status: Within Functional Limits for tasks assessed                                          Exercises      General Comments General comments (skin integrity, edema, etc.): nosebleed on arrival with difficulty stopping despite cold compress and compression. Able to ambulate once pressure applied via tissue in nose      Pertinent Vitals/Pain Pain Assessment Pain Assessment: No/denies pain    Home Living                          Prior Function            PT Goals (  current goals can now be found in the care plan section) Acute Rehab PT Goals PT Goal Formulation: With patient/family Time For Goal Achievement: 03/07/2022 Potential to Achieve Goals: Good Progress towards PT goals: Progressing toward goals    Frequency    Min 3X/week      PT Plan Current plan remains appropriate    Co-evaluation              AM-PAC PT "6 Clicks" Mobility   Outcome Measure  Help needed turning from your back to your side while in a flat bed without using bedrails?: A Little Help needed moving from lying on  your back to sitting on the side of a flat bed without using bedrails?: A Little Help needed moving to and from a bed to a chair (including a wheelchair)?: A Little Help needed standing up from a chair using your arms (e.g., wheelchair or bedside chair)?: A Little Help needed to walk in hospital room?: A Little Help needed climbing 3-5 steps with a railing? : A Little 6 Click Score: 18    End of Session   Activity Tolerance: Patient tolerated treatment well Patient left: in bed;with call bell/phone within reach;with nursing/sitter in room (seated EOB) Nurse Communication: Mobility status PT Visit Diagnosis: Unsteadiness on feet (R26.81);Muscle weakness (generalized) (M62.81);History of falling (Z91.81)     Time: 2542-7062 PT Time Calculation (min) (ACUTE ONLY): 30 min  Charges:  $Therapeutic Activity: 23-37 mins                     Koralyn Prestage A. Gilford Rile PT, DPT Acute Rehabilitation Services Office [phone]    Linna Hoff 03/05/2022, 4:45 PM

## 2022-03-06 DIAGNOSIS — R945 Abnormal results of liver function studies: Secondary | ICD-10-CM

## 2022-03-06 DIAGNOSIS — K6289 Other specified diseases of anus and rectum: Secondary | ICD-10-CM | POA: Diagnosis not present

## 2022-03-06 DIAGNOSIS — R109 Unspecified abdominal pain: Secondary | ICD-10-CM

## 2022-03-06 DIAGNOSIS — K7460 Unspecified cirrhosis of liver: Secondary | ICD-10-CM | POA: Diagnosis not present

## 2022-03-06 LAB — CBC
HCT: 25.4 % — ABNORMAL LOW (ref 39.0–52.0)
Hemoglobin: 8.8 g/dL — ABNORMAL LOW (ref 13.0–17.0)
MCH: 33 pg (ref 26.0–34.0)
MCHC: 34.6 g/dL (ref 30.0–36.0)
MCV: 95.1 fL (ref 80.0–100.0)
Platelets: 77 10*3/uL — ABNORMAL LOW (ref 150–400)
RBC: 2.67 MIL/uL — ABNORMAL LOW (ref 4.22–5.81)
RDW: 24.4 % — ABNORMAL HIGH (ref 11.5–15.5)
WBC: 5.7 10*3/uL (ref 4.0–10.5)
nRBC: 1.2 % — ABNORMAL HIGH (ref 0.0–0.2)

## 2022-03-06 LAB — PROTIME-INR
INR: 2.2 — ABNORMAL HIGH (ref 0.8–1.2)
Prothrombin Time: 23.9 seconds — ABNORMAL HIGH (ref 11.4–15.2)

## 2022-03-06 LAB — COMPREHENSIVE METABOLIC PANEL
ALT: 98 U/L — ABNORMAL HIGH (ref 0–44)
AST: 109 U/L — ABNORMAL HIGH (ref 15–41)
Albumin: 3 g/dL — ABNORMAL LOW (ref 3.5–5.0)
Alkaline Phosphatase: 86 U/L (ref 38–126)
Anion gap: 18 — ABNORMAL HIGH (ref 5–15)
BUN: 23 mg/dL (ref 8–23)
CO2: 28 mmol/L (ref 22–32)
Calcium: 9.6 mg/dL (ref 8.9–10.3)
Chloride: 92 mmol/L — ABNORMAL LOW (ref 98–111)
Creatinine, Ser: 4.73 mg/dL — ABNORMAL HIGH (ref 0.61–1.24)
GFR, Estimated: 12 mL/min — ABNORMAL LOW (ref >60)
Glucose, Bld: 97 mg/dL (ref 70–99)
Potassium: 3.8 mmol/L (ref 3.5–5.1)
Sodium: 138 mmol/L (ref 135–145)
Total Bilirubin: 2.8 mg/dL — ABNORMAL HIGH (ref 0.3–1.2)
Total Protein: 5.8 g/dL — ABNORMAL LOW (ref 6.5–8.1)

## 2022-03-06 LAB — MAGNESIUM: Magnesium: 1.9 mg/dL (ref 1.7–2.4)

## 2022-03-06 MED ORDER — ACETAMINOPHEN 325 MG PO TABS: 650.0000 mg | ORAL_TABLET | Freq: Four times a day (QID) | ORAL | Status: DC | PRN

## 2022-03-06 NOTE — Progress Notes (Addendum)
Advanced Heart Failure Rounding Note  PCP-Cardiologist: Sanda Klein, MD   Subjective:   .   Complaining of difficulty sleeping.  Objective:   Weight Range: 73.1 kg Body mass index is 23.12 kg/m.   Vital Signs:   Temp:  [97.4 F (36.3 C)-98.1 F (36.7 C)] 97.8 F (36.6 C) (11/02 0762) Pulse Rate:  [78-86] 85 (11/02 0632) Resp:  [12-20] 17 (11/01 2052) BP: (89-99)/(50-63) 98/63 (11/02 0632) SpO2:  [91 %-100 %] 97 % (11/02 2633) Weight:  [73.1 kg] 73.1 kg (11/01 0830) Last BM Date : 03/05/22  Weight change: Filed Weights   03/03/22 1500 03/03/22 1859 03/05/22 0830  Weight: 68.7 kg 68.1 kg 73.1 kg    Intake/Output:   Intake/Output Summary (Last 24 hours) at 03/06/2022 0727 Last data filed at 03/06/2022 0200 Gross per 24 hour  Intake 513.8 ml  Output 900 ml  Net -386.2 ml    Physical Exam  General: Sitting in the chair. Appears weak. No resp difficulty HEENT: normal Neck: supple. JVP 9-10 . Carotids 2+ bilat; no bruits. No lymphadenopathy or thryomegaly appreciated. Cor: PMI nondisplaced. Regular rate & rhythm. No rubs, gallops or murmurs. Lungs: clear Abdomen: soft, nontender, nondistended. No hepatosplenomegaly. No bruits or masses. Good bowel sounds. Extremities: no cyanosis, clubbing, rash, edema. LUE AVF  Neuro: alert & orientedx3, cranial nerves grossly intact. moves all 4 extremities w/o difficulty. Affect flat Telemetry  SR 70-80s   EKG    No new EKG to review   Labs    CBC Recent Labs    03/05/22 0257 03/06/22 0503  WBC 5.9 5.7  HGB 8.3* 8.8*  HCT 24.0* 25.4*  MCV 93.4 95.1  PLT 71* 77*   Basic Metabolic Panel Recent Labs    03/05/22 0257 03/06/22 0503  NA 137 138  K 3.3* 3.8  CL 95* 92*  CO2 25 28  GLUCOSE 123* 97  BUN 31* 23  CREATININE 6.51* 4.73*  CALCIUM 9.3 9.6  MG 2.2 1.9   Liver Function Tests Recent Labs    03/05/22 0257 03/06/22 0503  AST 142* 109*  ALT 113* 98*  ALKPHOS 98 86  BILITOT 2.3* 2.8*  PROT  5.8* 5.8*  ALBUMIN 3.1* 3.0*   No results for input(s): "LIPASE", "AMYLASE" in the last 72 hours. Cardiac Enzymes No results for input(s): "CKTOTAL", "CKMB", "CKMBINDEX", "TROPONINI" in the last 72 hours.  BNP: BNP (last 3 results) Recent Labs    06/02/21 0958 06/14/21 0041 02/28/22 1814  BNP 3,498.9* >4,500.0* >4,500.0*    ProBNP (last 3 results) No results for input(s): "PROBNP" in the last 8760 hours.   D-Dimer No results for input(s): "DDIMER" in the last 72 hours. Hemoglobin A1C No results for input(s): "HGBA1C" in the last 72 hours. Fasting Lipid Panel No results for input(s): "CHOL", "HDL", "LDLCALC", "TRIG", "CHOLHDL", "LDLDIRECT" in the last 72 hours. Thyroid Function Tests No results for input(s): "TSH", "T4TOTAL", "T3FREE", "THYROIDAB" in the last 72 hours.  Invalid input(s): "FREET3"   Other results:   Imaging    No results found.   Medications:     Scheduled Medications:  apixaban  5 mg Oral BID   Chlorhexidine Gluconate Cloth  6 each Topical Q0600   gabapentin  100 mg Oral QHS   mesalamine  4 g Rectal QHS   midodrine  15 mg Oral TID WC   pantoprazole  40 mg Oral BID   sertraline  25 mg Oral q AM   sodium chloride flush  3 mL  Intravenous Q12H    Infusions:  sodium chloride      PRN Medications: sodium chloride, acetaminophen **OR** acetaminophen, guaiFENesin, hydrALAZINE, ipratropium-albuterol, LORazepam, metoprolol tartrate, ondansetron **OR** ondansetron (ZOFRAN) IV, senna-docusate, sodium chloride flush, traZODone    Patient Profile   Ruben [name] 76 y.o. male with history of HTN, HLD, ESRD, PAF, ischemic DCM, CAD, tobacco and alcohol abuse (pint a day of wine formerly), depression. AHF team asked to see for elevated LFTs in the setting of amiodarone use.   Assessment/Plan  Transaminitis, elevated bilirubin, suspected 2/2 amiodarone use - liver MRI 2/23: Benign liver cysts, stable since 2019 CT. 12 mm benign lesions - Abd MRCP:  Mildly nodular hepatic contour, suggesting cirrhosis. Moderate hepatic steatosis. Scattered probable hepatic cysts, measuring up to 14 mm - GI following, additional testing pending - AST 31>231>293>295>234>182>142>109 - ALT 33>126>160>177>145>133>113>98 - tbili 1.5>3>3.2>3.1>3.6>2.6>2.4>2.3 >2.8 - Previously on amiodarone, last dose 10/28 w/ concern of amiodarone related liver toxicity.  -Continue to hold amio.  -. Liver enzymes downtrending. No other sequale of amio toxicity noted (normal TSH/T3).  - liver biopsy/paracentesis being considered per GI 2. CAD: Suspect delayed presentation inferior MI in 12/22.  He was not cathed initially due to AKI and delayed presentation. Eventual LHC with occluded RCA (culprit), diffuse moderate disease in LAD and LCx. No good PCI targets. Not a candidate for CABG.  - no chest pain.    - Continue atorvastatin - Previously on Eliquis so no ASA.  3. Chronic systolic CHF: Ischemic cardiomyopathy with infarct-related MR. Echo in 12/22 with EF 35-40%, inferolateral and basal inferior akinesis, moderate-severe eccentric likely infarct-related MR, normal RV. TEE in 1/23 with EF 35-40%, moderate MR (suspect infarct-related), aortic dilatation measuring 44 mm, moderate AI. Cardiogenic shock requiring milrinone during 12/22-1/23 hospitalization, titrated off.  Now requiring midodrine to maintain BP with HD. Echo 6/23 with EF 40-45%, RV mildly reduced, moderate to severe ischemic MR. TEE in 6/23 with EF 25-30%, RV mildly reduced, severe MR.   - Volume status elevated.  - Continue midodrine 15 mg TID - HD for volume management.  - Unable to initiate GDMT due to low BP/ESRD.  4. Valvular heart disease:  Severe ischemic MR on 6/23 TEE.  Not candidate for Mitraclip per structural heart team due to poor functional capacity and challenging imaging.  5. Atrial fibrillation: First noted in 12/22, s/p TEE-guided DCCV in 1/23. I SR  - Previously on amiodarone daily, continue to  hold with elevated enzymes.  - In SR today. Back on eliquis. 6. ESRD: Patient has trouble with hypotension with HD.   - Now on midodrine 15 mg TID - nephrology following, volume management w/ HD, had session yesterday 7. Smoker: Quit 04/28/21. 8. ETOH abuse: Last drink was 04/28/21.  9. Rectal pain - Per primary 10. MR Functional. Mod-Severe. Not a candidate for mitral clip.   Palliative Care consulted.   Length of Stay: 4  Amy Clegg NP-C  03/06/2022, 7:27 AM  Advanced Heart Failure Team Pager [phone] (M-F; 7a - 5p)  Please contact [facility] for night-coverage after hours (5p -7a ) and weekends on [email or web address]  Agree with the above NP note.  Plan to discharge today noted.  Suspect end-stage CHF in setting of ESRD, palliative care evaluation is appropriate.  LFTs trending down.  Would stay off amiodarone, can restart atorvastatin at discharge.    We will set up followup.   Loralie Champagne 03/06/2022

## 2022-03-06 NOTE — Progress Notes (Signed)
[facility] Progress Note   Subjective: Seen in room. Discussed Palliative Care consult with daughter. She is agreeable to consult. No C/Os. Discharge orders noted.   Objective Vitals:   03/05/22 1610 03/05/22 2052 03/06/22 0632 03/06/22 0814  BP: (!) 89/50 (!) 92/57 98/63 (!) 86/53  Pulse: 85 82 85 84  Resp: '20 17  20  '$ Temp: (!) 97.4 F (36.3 C) 98.1 F (36.7 C) 97.8 F (36.6 C) 97.8 F (36.6 C)  TempSrc: Oral Oral Oral Oral  SpO2: 100% 95% 97% 98%  Weight:      Height:       Physical Exam General: Pt sitting in chair at bedside in NAD Heart: J8/H6 with 2/6 systolic M. SR on monitor.  Lungs: CTAB, no WOB.  Abdomen: protuberant, probable ascites present. NABS.  Extremities:No LE edema Dialysis Access: L AVG +T/B   Additional Objective Labs: Basic Metabolic Panel: Recent Labs  Lab 03/03/22 0330 03/03/22 0337 03/04/22 0422 03/05/22 0257 03/06/22 0503  NA 142   < > 139 137 138  K 4.3   < > 3.4* 3.3* 3.8  CL 100   < > 98 95* 92*  CO2 21*   < > '27 25 28  '$ GLUCOSE 104*   < > 116* 123* 97  BUN 37*   < > 21 31* 23  CREATININE 7.26*   < > 5.26* 6.51* 4.73*  CALCIUM 9.4   < > 9.1 9.3 9.6  PHOS 3.8  --   --   --   --    < > = values in this interval not displayed.   Liver Function Tests: Recent Labs  Lab 03/04/22 0422 03/05/22 0257 03/06/22 0503  AST 182* 142* 109*  ALT 133* 113* 98*  ALKPHOS 108 98 86  BILITOT 2.4* 2.3* 2.8*  PROT 5.4* 5.8* 5.8*  ALBUMIN 2.4* 3.1* 3.0*   Recent Labs  Lab 02/28/22 1814  LIPASE 61*   CBC: Recent Labs  Lab 02/27/22 2047 03/01/22 0203 03/02/22 0327 03/03/22 0337 03/04/22 0422 03/05/22 0257 03/06/22 0503  WBC 7.2   < > 7.2 5.2 6.1 5.9 5.7  NEUTROABS 6.0  --   --   --   --   --   --   HGB 10.4*   < > 9.4* 8.4* 8.4* 8.3* 8.8*  HCT 30.5*   < > 28.3* 25.4* 25.4* 24.0* 25.4*  MCV 92.4   < > 95.0 94.8 95.1 93.4 95.1  PLT 126*   < > 82* 80* 77* 71* 77*   < > = values in this interval not displayed.    Blood Culture    Component Value Date/Time   SDES  02/27/2022 2106    BLOOD BLOOD RIGHT FOREARM Performed at [facility], [address] [place], [place] [address]    [facility]  02/27/2022 2106    BOTTLES DRAWN AEROBIC AND ANAEROBIC Blood Culture adequate volume Performed at [facility], [address] [place] Bhat [place], [place] [address]    CULT  02/27/2022 2106    NO GROWTH 5 DAYS Performed at [facility], [place] [address], [place], [place] [address]    REPTSTATUS 03/05/2022 FINAL 02/27/2022 2106    Cardiac Enzymes: No results for input(s): "CKTOTAL", "CKMB", "CKMBINDEX", "TROPONINI" in the last 168 hours. CBG: Recent Labs  Lab 02/28/22 1348  GLUCAP 87   Iron Studies: No results for input(s): "IRON", "TIBC", "TRANSFERRIN", "FERRITIN" in the last 72 hours. '@lablastinr3'$ @ Studies/Results: ECHOCARDIOGRAM LIMITED  Result Date: 03/04/2022    ECHOCARDIOGRAM LIMITED REPORT   Patient Name:   JSON KOELZER [facility] Date of Exam: 03/04/2022 Medical Rec #:  [id number]             Height:       70.0 in Accession #:    [id number]            Weight:       150.1 lb Date of Birth:  [birth year]             BSA:          1.848 m Patient Age:    76 years              BP:           86/55 mmHg Patient Gender: M                     HR:           76 bpm. Exam Location:  Inpatient Procedure: Limited Echo, Color Doppler and Cardiac Doppler Indications:    ischemic cardiomyopathy  History:        Patient has prior history of Echocardiogram examinations, most                 recent 10/31/2021. CHF, CAD, end stage renal disease. Cirrhosis.;                 Risk Factors:Dyslipidemia.  Sonographer:    Johny Chess RDCS Referring Phys: [phone] ADITYA [place]  1. Left ventricular ejection fraction, by estimation, is 25 to 30%. The left ventricle has severely decreased function. The left ventricle demonstrates global hypokinesis. Left ventricular diastolic parameters are  indeterminate.  2. Large pleural effusion in both left and right lateral regions.  3. Moderate to severe mitral valve regurgitation. MR is functional. The mean mitral valve gradient is 2.0 mmHg. TVI tips fo the mitral leaflet to the LVOT is 0.88.  4. Tricuspid valve regurgitation is moderate.  5. Aortic valve regurgitation is mild to moderate. PHT likely overestimates the degree of AI.  6. Aortic dilatation noted. There is mild dilatation of the ascending aorta, measuring 40 mm.  7. There is mildly elevated pulmonary artery systolic pressure.  8. The inferior vena cava is dilated in size with <50% respiratory variability, suggesting right atrial pressure of 15 mmHg. Comparison(s): Compared to last images (TEE 10/31/21), slight improvement valvular disease. FINDINGS  Left Ventricle: Left ventricular ejection fraction, by estimation, is 25 to 30%. The left ventricle has severely decreased function. The left ventricle demonstrates global hypokinesis. The left ventricular internal cavity size was normal in size. There is no left ventricular hypertrophy. Left ventricular diastolic parameters are indeterminate. Right Ventricle: There is mildly elevated pulmonary artery systolic pressure. The tricuspid regurgitant velocity is 2.59 m/s, and with an assumed right atrial pressure of 15 mmHg, the estimated right ventricular systolic pressure is 66.5 mmHg. Pericardium: Trivial pericardial effusion is present. The pericardial effusion is circumferential. Mitral Valve: Moderate to severe mitral valve regurgitation. The mean mitral valve gradient is 2.0 mmHg. Tricuspid Valve: Tricuspid valve regurgitation is moderate. Aortic Valve: Aortic valve regurgitation is mild to moderate. Aortic regurgitation PHT measures 280 msec. Pulmonic Valve: The pulmonic valve was normal in structure. Pulmonic valve regurgitation is mild. No evidence of pulmonic stenosis. Aorta: Aortic dilatation noted. There is mild dilatation of the ascending aorta,  measuring 40 mm. Venous: The inferior vena cava is dilated in size with less than  50% respiratory variability, suggesting right atrial pressure of 15 mmHg. Additional Comments: There is a large pleural effusion in both left and right lateral regions. LEFT VENTRICLE PLAX 2D LVIDd:         5.40 cm   Diastology LVIDs:         4.70 cm   LV e' medial:  6.74 cm/s LV PW:         1.00 cm   LV e' lateral: 6.96 cm/s LV IVS:        1.00 cm LVOT diam:     1.80 cm LV SV:         46 LV SV Index:   25 LVOT Area:     2.54 cm  IVC IVC diam: 2.30 cm LEFT ATRIUM         Index LA diam:    4.30 cm 2.33 cm/m  AORTIC VALVE LVOT Vmax:   103.00 cm/s LVOT Vmean:  66.300 cm/s LVOT VTI:    0.181 m AI PHT:      280 msec  AORTA Ao Asc diam: 4.00 cm MITRAL VALVE           TRICUSPID VALVE MV Mean grad: 2.0 mmHg TR Peak grad:   26.8 mmHg                        TR Vmax:        259.00 cm/s                         SHUNTS                        Systemic VTI:  0.18 m                        Systemic Diam: 1.80 cm Rudean Haskell MD Electronically signed by Rudean Haskell MD Signature Date/Time: 03/04/2022/3:06:12 PM    Final    Medications:  sodium chloride      apixaban  5 mg Oral BID   Chlorhexidine Gluconate Cloth  6 each Topical Q0600   gabapentin  100 mg Oral QHS   mesalamine  4 g Rectal QHS   midodrine  15 mg Oral TID WC   pantoprazole  40 mg Oral BID   sertraline  25 mg Oral q AM   sodium chloride flush  3 mL Intravenous Q12H     HD orders: [facility] MWF 3:45 hrs 180NRe 400/500 70 kg 2.0 K/2.0 Ca UFP 2 AVG -No heparin -Mircera 30 mcg IV q 2 weeks (Last dose 02/24/22)   Assessment/Plan:    # Rectal pain  - felt 2/2 non-infectious prostatitis per charting - per primary team and GI   # Transaminitis with fatty infiltration/cirrhosis - No paracentesis per GI.  - Holding amiodarone with down trend in liver enzymes however GI believes elevation of enzymes if multifactorial, probably related to HF.  - Per primary/GI  signed off.    # ESRD - HD per MWF   - Next HD 03/07/2022 at OP center.    # Chronic hypotension  - controlled on midodrine   # Volume-now under OP EDW. BP on low side with high dose midodrine. UF as tolerated, attempt to maximize volume on HD. Will need lower EDW on discharge.     # Anemia CKD - Hb acceptable at 8.4.  ESA recently administered   # Metabolic  bone disease  - Check PO4 if able to add to today's labs   # Supratherapeutic INR-present on admission. Now in range. Starting heparin per pharmacy.  - per primary team    # [place]ble - Patient has multiple co-morbid conditions. Believe he would benefit from Palliative Care Consult.    Disposition-per primary  Jimmye Norman. Connor Foxworthy NP-C 03/06/2022, 1:24 PM  [employer] [phone]

## 2022-03-06 NOTE — Assessment & Plan Note (Signed)
Echocardiogram with reduced LV systolic function 25 to 04%, global hypokinesis, moderate to severe mitral regurgitation, moderate TR, RVSP 41.8, trivial pericardial effusion.   Limited pharmacologic therapy due to risk of hypotension and because ESRD.  Moderate to severe MR.  Plan to continue with midodrine for blood pressure support. Continue to hold on amiodarone.

## 2022-03-06 NOTE — Discharge Summary (Signed)
Physician Discharge Summary   Patient: Ruben [name] MRN: [id number] DOB: 1946/03/17  Admit date:     02/27/2022  Discharge date: 03/06/22  Discharge Physician: Jimmy Picket Jheremy Boger   PCP: [name], MD   Recommendations at discharge:    Patient will follow up with GI as outpatient  Holding amiodarone and statin therapy Decreased dose of acetaminophen Follow up liver function test as outpatient Patient not candidate for mitral valve intervention, for severe mitral regurgitation Follow up with heart failure as outpatient  Follow up with Dr Doristine Bosworth in 7 to 10 days.   I spoke with patient's wife at the bedside, we talked in detail about patient's condition, plan of care and prognosis and all questions were addressed.   Discharge Diagnoses: Principal Problem:   Proctitis Active Problems:   Cirrhosis ([place])   Cholelithiasis   ESRD on hemodialysis (HCC)   Paroxysmal atrial fibrillation (HCC)   Chronic systolic CHF (congestive heart failure) (HCC)   CAD (coronary artery disease)   Mitral regurgitation   Anxiety and Depression   HLD (hyperlipidemia)   Normocytic anemia   Acute on chronic systolic CHF (congestive heart failure) (HCC)   Cirrhosis of liver with ascites (HCC)   Elevated INR  Resolved Problems:   HTN (hypertension)   Tobacco abuse   ETOH abuse   Proctitis  Hospital Course: Ruben [name] was admitted to the hospital with the working diagnosis of proctitis.   76 yo male with the past medical history of ESRD on HD, heart failure, paroxysmal atrial fibrillation, hypertension, alcohol abuse, severe mitral regurgitation, and depression who presented with rectal pain. Reported one week of rectal pain. No fevers or chills.  He was evaluated by his primary care provider, and he was referred to [facility] for pelvic imaging concerning a rectal fistula.  On his initial physical examination his blood pressure was 94/58, HR 99, RR 16 and 02 saturation 93% on room  air, lungs with no wheezing or rales, heart with S1 and S2 present and rhythmic, abdomen with mild distention but not tender, no lower extremity edema.   Na 139, K 3,9 CL 94 bicarbonate 31 glucose 99, bun 34 cr 6,57  AST 231 ALT 126  Lactic acid 2,1  Wbc 7,2 hgb 10,4 plt 126   CT abdomen and pelvis with no perirectal abscess, but diffuse wall thickening of the low rectum just above the anus. Possible proctitis.  Hepatic cirrhosis. Cholelithiasis with multiple stone filling the gallbladder, diffuse ascites, diffuse soft tissue edema. Pulmonary edema at the bases.   Abdominal ultrasound with cholelithiasis in a contracted gallbladder. Ascites and fatty infiltration of the liver.   MRCP with cholelithiasis without associated findings to suggest acute cholecystitis. No intrahepatic or extrahepatic duct dilatation. Common duct measures 4 mm. No choledocholithiasis seen.   EKG 90 bpm, normal axis, normal intervals, sinus rhythm with no significant ST segment or T wave changes.   GI was consulted and recommended mesalamine enemas during his hospitalization for rectal pain.  Not recommended paracentesis of liver biopsy at this point, will not change management.   Patient was placed on supportive medical therapy. Amiodarone was discontinued due to elevated liver enzymes. Patient with severe mitral regurgitation not candidate for valve intervention.       Assessment and Plan: * Proctitis Patient with non infectious proctitis. He was treated with mesalamine enemas during his hospitalization per GI recommendations with improvement in his symptoms.  Patient will follow up as outpatient with GI.   Cirrhosis ([place])  Patient with elevated liver enzymes, imaging positive for fatty liver and cirrhosis, Patient was paced on midodrine and albumin.  No paracentesis was required.   Possible component of congestive hepatopathy due to severe mitral regurgitation. Unfortunately not candidate for valve  intervention. Amiodarone was discontinued to prevent hepatotoxicity.  Follow up liver enzymes as outpatient.   Cholelithiasis Positive gallstone but not cholecystitis Patient with no abdominal pain at the time of his discharge, with no nausea or vomiting.  Probably high surgical risk due to advanced heart failure.   ESRD on hemodialysis ([place]) Continue renal replacement therapy including ultrafiltration for volume management.   Paroxysmal atrial fibrillation (HCC) Patient remained in sinus rhythm. Amiodarone has been discontinued to prevent hepatotoxicity, continue anticoagulation with apixaban.   Chronic systolic CHF (congestive heart failure) ([place]) TEE 06/23: EF 25-30%, RV mildly reduced, severe ischemic MR  Continue midodrine for blood pressure support.  Moderate to severe MR. Not candidate for mitral valve repair.   CAD (coronary artery disease) LHC (1/23): Occluded RCA with collaterals, serial 50%/80%/80% mid-distal LCx stenoses, serial 60%/70%/50% proximal to mid LAD stenoses, 70% ostial D1. No good PCI targets and not a CABG candidate.   Resume statin as outpatient when liver enzymes more stable.   Mitral regurgitation Severe ischemic MR on 6/23 TEE.  Not candidate for Mitraclip per structural heat team due to poor functional capacity and challenging imaging.   Anxiety and Depression Continue zoloft   HLD (hyperlipidemia) Hold statin since LFT >3x normal limit   Normocytic anemia Likely some ACD from CKD Stable Continue to follow   Acute on chronic systolic CHF (congestive heart failure) (HCC) Echocardiogram with reduced LV systolic function 25 to 14%, global hypokinesis, moderate to severe mitral regurgitation, moderate TR, RVSP 41.8, trivial pericardial effusion.   Limited pharmacologic therapy due to risk of hypotension and because ESRD.  Moderate to severe MR.  Plan to continue with midodrine for blood pressure support. Continue to hold on amiodarone.           Consultants: GI, cardiology  Procedures performed: none   Disposition: Home Diet recommendation:  Discharge Diet Orders (From admission, onward)     Start     Ordered   03/06/22 0000  Diet - low sodium heart healthy        03/06/22 0850           Cardiac diet DISCHARGE MEDICATION: Allergies as of 03/06/2022       Reactions   Other Other (See Comments)   Pollen - sneezing, itching/watery eyes        Medication List     STOP taking these medications    acetaminophen 650 MG CR tablet Commonly known as: TYLENOL Replaced by: acetaminophen 325 MG tablet   amiodarone 200 MG tablet Commonly known as: PACERONE   atorvastatin 80 MG tablet Commonly known as: LIPITOR       TAKE these medications    acetaminophen 325 MG tablet Commonly known as: TYLENOL Take 2 tablets (650 mg total) by mouth every 6 (six) hours as needed for mild pain (or Fever >/= 101). Replaces: acetaminophen 650 MG CR tablet   apixaban 5 MG Tabs tablet Commonly known as: ELIQUIS Take 1 tablet (5 mg total) by mouth 2 (two) times daily.   cholecalciferol 25 MCG (1000 UNIT) tablet Commonly known as: VITAMIN D3 Take 1,000 Units by mouth in the morning.   dicyclomine 20 MG tablet Commonly known as: BENTYL Take 1 tablet (20 mg total) by mouth 2 (two)  times daily.   ethyl chloride spray Apply 1 Application topically as needed (prior to dialysis).   gabapentin 100 MG capsule Commonly known as: NEURONTIN Take 100 mg by mouth at bedtime.   hydrocortisone 25 MG suppository Commonly known as: ANUSOL-HC Place 1 suppository (25 mg total) rectally 2 (two) times daily. What changed:  when to take this reasons to take this   hydrOXYzine 25 MG tablet Commonly known as: ATARAX Take 25 mg by mouth daily.   loratadine 10 MG tablet Commonly known as: CLARITIN Take 10 mg by mouth every other day.   LORazepam 0.5 MG tablet Commonly known as: ATIVAN Take 0.5 mg by mouth as needed  for anxiety.   magnesium oxide 400 (240 Mg) MG tablet Commonly known as: MAG-OX Take 400 mg by mouth daily in the afternoon.   melatonin 5 MG Tabs Take 5-10 mg by mouth at bedtime as needed (sleep).   metoCLOPramide 5 MG tablet Commonly known as: REGLAN Take 1 tablet (5 mg total) by mouth every 8 (eight) hours as needed for nausea.   midodrine 10 MG tablet Commonly known as: PROAMATINE Take 1.5 tablets (15 mg total) by mouth 3 (three) times daily with meals.   MiraLax 17 GM/SCOOP powder Generic drug: polyethylene glycol powder Take 17 g by mouth daily. Make be given every other day as well   nitroGLYCERIN 0.4 MG SL tablet Commonly known as: Nitrostat Place 1 tablet (0.4 mg total) under the tongue every 5 (five) minutes as needed for chest pain.   NONFORMULARY OR COMPOUNDED ITEM Place 1 Application rectally 2 (two) times daily as needed for hemorrhoids. Diltiazem 2% Ointment   ondansetron 4 MG tablet Commonly known as: Zofran Take 1 tablet (4 mg total) by mouth daily as needed for nausea or vomiting.   pantoprazole 40 MG tablet Commonly known as: PROTONIX Take 1 tablet (40 mg total) by mouth 2 (two) times daily.   phenylephrine 0.25 % suppository Commonly known as: (USE for PREPARATION-H) Place 1 suppository rectally 2 (two) times daily as needed for hemorrhoids.   prochlorperazine 5 MG tablet Commonly known as: COMPAZINE Take 5 mg by mouth 3 (three) times daily as needed for nausea or vomiting.   sertraline 25 MG tablet Commonly known as: ZOLOFT Take 25 mg by mouth in the morning.        Follow-up Information     Carol Ada, MD. Schedule an appointment as soon as possible for a visit .   Specialty: Gastroenterology Contact information: [place], [place] [address] [phone]         [facility] [place] Follow up.   Specialty: [place] Why: Agency will call you to set up apt times Contact information: [place] [address] [phone]         [name], MD Follow up.   Specialty: Internal Medicine Contact information: [address] [place] [address] [phone]                Discharge Exam: Danley Danker Weights   03/03/22 1500 03/03/22 1859 03/05/22 0830  Weight: 68.7 kg 68.1 kg 73.1 kg   BP (!) 86/53 (BP Location: Right Arm) Comment: RN Aware  Pulse 84   Temp 97.8 F (36.6 C) (Oral)   Resp 20   Ht '5\' 10"'$  (1.778 m)   Wt 73.1 kg   SpO2 98%   BMI 23.12 kg/m   Patient with no chest pain  or dyspnea, no abdominal pain or rectal pain  Neurology awake and alert ENT with no pallor Cardiovascular with S1 and S2 present, with no gallops, positive systolic murmur at the apex Respiratory with no rales or wheezing Abdomen with no distention  No lower extremity edema   Condition at discharge: stable  The results of significant diagnostics from this hospitalization (including imaging, microbiology, ancillary and laboratory) are listed below for reference.   Imaging Studies: ECHOCARDIOGRAM LIMITED  Result Date: 03/04/2022    ECHOCARDIOGRAM LIMITED REPORT   Patient Name:   Ruben [name] [facility] Date of Exam: 03/04/2022 Medical Rec #:  [id number]             Height:       70.0 in Accession #:    [id number]            Weight:       150.1 lb Date of Birth:  1945/06/26             BSA:          1.848 m Patient Age:    84 years              BP:           86/55 mmHg Patient Gender: M                     HR:           76 bpm. Exam Location:  Inpatient Procedure: Limited Echo, Color Doppler and Cardiac Doppler Indications:    ischemic cardiomyopathy  History:        Patient has prior history of Echocardiogram examinations, most                 recent 10/31/2021. CHF, CAD, end stage renal disease. Cirrhosis.;                 Risk Factors:Dyslipidemia.  Sonographer:    Johny Chess RDCS Referring Phys: [phone] ADITYA [place]  1.  Left ventricular ejection fraction, by estimation, is 25 to 30%. The left ventricle has severely decreased function. The left ventricle demonstrates global hypokinesis. Left ventricular diastolic parameters are indeterminate.  2. Large pleural effusion in both left and right lateral regions.  3. Moderate to severe mitral valve regurgitation. MR is functional. The mean mitral valve gradient is 2.0 mmHg. TVI tips fo the mitral leaflet to the LVOT is 0.88.  4. Tricuspid valve regurgitation is moderate.  5. Aortic valve regurgitation is mild to moderate. PHT likely overestimates the degree of AI.  6. Aortic dilatation noted. There is mild dilatation of the ascending aorta, measuring 40 mm.  7. There is mildly elevated pulmonary artery systolic pressure.  8. The inferior vena cava is dilated in size with <50% respiratory variability, suggesting right atrial pressure of 15 mmHg. Comparison(s): Compared to last images (TEE 10/31/21), slight improvement valvular disease. FINDINGS  Left Ventricle: Left ventricular ejection fraction, by estimation, is 25 to 30%. The left ventricle has severely decreased function. The left ventricle demonstrates global hypokinesis. The left ventricular internal cavity size was normal in size. There is no left ventricular hypertrophy. Left ventricular diastolic parameters are indeterminate. Right Ventricle: There is mildly elevated pulmonary artery systolic pressure. The tricuspid regurgitant velocity is 2.59 m/s, and with an assumed right atrial pressure of 15 mmHg, the estimated right ventricular systolic pressure is 33.8 mmHg. Pericardium: Trivial pericardial effusion is present. The pericardial effusion is circumferential. Mitral Valve: Moderate to severe  mitral valve regurgitation. The mean mitral valve gradient is 2.0 mmHg. Tricuspid Valve: Tricuspid valve regurgitation is moderate. Aortic Valve: Aortic valve regurgitation is mild to moderate. Aortic regurgitation PHT measures 280 msec.  Pulmonic Valve: The pulmonic valve was normal in structure. Pulmonic valve regurgitation is mild. No evidence of pulmonic stenosis. Aorta: Aortic dilatation noted. There is mild dilatation of the ascending aorta, measuring 40 mm. Venous: The inferior vena cava is dilated in size with less than 50% respiratory variability, suggesting right atrial pressure of 15 mmHg. Additional Comments: There is a large pleural effusion in both left and right lateral regions. LEFT VENTRICLE PLAX 2D LVIDd:         5.40 cm   Diastology LVIDs:         4.70 cm   LV e' medial:  6.74 cm/s LV PW:         1.00 cm   LV e' lateral: 6.96 cm/s LV IVS:        1.00 cm LVOT diam:     1.80 cm LV SV:         46 LV SV Index:   25 LVOT Area:     2.54 cm  IVC IVC diam: 2.30 cm LEFT ATRIUM         Index LA diam:    4.30 cm 2.33 cm/m  AORTIC VALVE LVOT Vmax:   103.00 cm/s LVOT Vmean:  66.300 cm/s LVOT VTI:    0.181 m AI PHT:      280 msec  AORTA Ao Asc diam: 4.00 cm MITRAL VALVE           TRICUSPID VALVE MV Mean grad: 2.0 mmHg TR Peak grad:   26.8 mmHg                        TR Vmax:        259.00 cm/s                         SHUNTS                        Systemic VTI:  0.18 m                        Systemic Diam: 1.80 cm Rudean Haskell MD Electronically signed by Rudean Haskell MD Signature Date/Time: 03/04/2022/3:06:12 PM    Final    MR ABDOMEN MRCP WO CONTRAST  Result Date: 02/28/2022 CLINICAL DATA:  Cholelithiasis EXAM: MRI ABDOMEN WITHOUT CONTRAST  (INCLUDING MRCP) TECHNIQUE: Multiplanar multisequence MR imaging of the abdomen was performed. Heavily T2-weighted images of the biliary and pancreatic ducts were obtained, and three-dimensional MRCP images were rendered by post processing. COMPARISON:  Right upper quadrant ultrasound dated 02/28/2022. CT abdomen/pelvis dated 02/27/2022. FINDINGS: Motion degraded images. Lower chest: Small bilateral pleural effusions. Hepatobiliary: Mildly nodular hepatic contour, suggesting cirrhosis.  Moderate hepatic steatosis. Scattered probable hepatic cysts, measuring up to 14 mm in segment 4A (series 13/image 15). Contracted gallbladder with cholelithiasis. No associated inflammatory changes to suggest acute cholecystitis. No intrahepatic or extrahepatic ductal dilatation. Common duct measures 4 mm. No choledocholithiasis is seen. Pancreas:  Within normal limits. Spleen:  Within normal limits. Adrenals/Urinary Tract:  Adrenal glands are within normal limits. Kidneys are notable for multiple simple bilateral renal cysts, measuring up to 2.0 cm in the right upper kidney (series 4/image 17), benign (Bosniak I). Additional bilateral  hemorrhagic lesions, with intrinsic T1 hyperintensity and intermediate/dark on T2, measuring up to 13 mm in the medial interpolar right kidney (series 4/image 26) and 17 mm in the anterior interpolar left kidney (series 4/image 27), benign (Bosniak II). No follow-up is recommended. No hydronephrosis. Stomach/Bowel: Stomach is within normal limits. Susceptibility artifact in the second portion of the duodenum, related to the patient's hemostasis clip from recent EGD. Visualized bowel is otherwise grossly unremarkable. Vascular/Lymphatic:  No evidence of abdominal aortic aneurysm. No suspicious abdominal lymphadenopathy. Other:  Moderate upper abdominal ascites. Musculoskeletal: No focal osseous lesions. IMPRESSION: Motion degraded images. Cholelithiasis, without associated findings to suggest acute cholecystitis. No intrahepatic or extrahepatic ductal dilatation. Common duct measures 4 mm. No choledocholithiasis is seen. Suspected cirrhosis. Moderate hepatic steatosis. Scattered hepatic cysts measuring up to 14 mm, benign. Additional ancillary findings as above. Electronically Signed   By: Julian Hy M.D.   On: 02/28/2022 20:16   US Abdomen Limited RUQ (LIVER/GB)  Result Date: 02/28/2022 CLINICAL DATA:  History of cholelithiasis with right upper quadrant pain, initial  encounter EXAM: ULTRASOUND ABDOMEN LIMITED RIGHT UPPER QUADRANT COMPARISON:  CT from the previous day. FINDINGS: Gallbladder: Gallbladder is decompressed with wall echo shadow sign consistent with cholelithiasis. This is similar to that seen on recent CT examination. Wall is at the upper limits of normal. No pericholecystic fluid is seen. Negative sonographic Murphy's sign is noted. Common bile duct: Diameter: 4.3 mm. Liver: Increased in echogenicity consistent with fatty liver. Portal vein is patent on color Doppler imaging with normal direction of blood flow towards the liver. Other: Mild ascites is seen. IMPRESSION: Cholelithiasis in a contracted gallbladder Ascites and fatty infiltration of the liver. Electronically Signed   By: Inez Catalina M.D.   On: 02/28/2022 01:14   CT ABDOMEN PELVIS W CONTRAST  Result Date: 02/27/2022 CLINICAL DATA:  Abdominal pain, acute nonlocalized. Concern for rectal abscess. EXAM: CT ABDOMEN AND PELVIS WITH CONTRAST TECHNIQUE: Multidetector CT imaging of the abdomen and pelvis was performed using the standard protocol following bolus administration of intravenous contrast. RADIATION DOSE REDUCTION: This exam was performed according to the departmental dose-optimization program which includes automated exposure control, adjustment of the mA and/or kV according to patient size and/or use of iterative reconstruction technique. CONTRAST:  169m OMNIPAQUE IOHEXOL 300 MG/ML  SOLN COMPARISON:  11/29/2021 FINDINGS: Lower chest: Small bilateral pleural effusions with basilar atelectasis, greater on the left. Hazy interstitial pattern to the lung bases likely representing edema. Cardiac enlargement. Coronary artery calcifications. Hepatobiliary: Subcentimeter low-attenuation lesions in the liver are too small to characterize but likely represent small cysts or hemangiomas. No change since prior study. No imaging follow-up is indicated. Probable hepatic cirrhosis with enlarged lateral  segment of the left lobe of the liver. Cholelithiasis with multiple stones filling the gallbladder. Gallbladder is contracted. No bile duct dilatation. Pancreas: Unremarkable. No pancreatic ductal dilatation or surrounding inflammatory changes. Spleen: Normal in size without focal abnormality. Adrenals/Urinary Tract: No adrenal gland nodules. Renal nephrograms are delayed consistent with history of renal insufficiency. Multiple bilateral renal cysts, largest on the right measuring 2.2 cm diameter. No imaging follow-up is indicated. No hydronephrosis or hydroureter. Bladder is normal. Stomach/Bowel: Stomach, small bowel, and colon are not abnormally distended. Mild scattered hyperdense material likely representing ingested material. Diverticulosis of the sigmoid colon without evidence of acute diverticulitis. Appendix is normal. The anorectal wall appears diffusely thickened without pouching of the lumen over the thickened segment. This could represent proctitis or rectal mass. Direct visualization is suggested. Vascular/Lymphatic:  Diffuse aortic calcification. Scattered mural thrombus. No aneurysm or dissection. Focal stenosis of the right external iliac artery likely representing about 70% diameter reduction. No significant lymphadenopathy. Reproductive: Prostate gland is unremarkable. Other: Moderate diffuse abdominal and pelvic free fluid, likely ascites. Edema throughout the subcutaneous soft tissues. No loculated collections. No free air. Musculoskeletal: Degenerative changes. No destructive bone lesions. Mild diffuse bone sclerosis may represent renal osteodystrophy. Curvilinear sclerosis in the right femoral head suggesting avascular necrosis. IMPRESSION: 1. No perirectal abscess is identified but there is diffuse wall thickening of the low rectum just above the anus. This could represent proctitis or rectal mass. Direct visualization is suggested. 2. Hepatic cirrhosis. 3. Cholelithiasis with multiple stones  filling the gallbladder. 4. Small bilateral pleural effusions. Diffuse abdominal and pelvic ascites. Diffuse soft tissue edema. 5. Aortic atherosclerosis with significant focal stenosis demonstrated in the right external iliac artery. 6. Cardiac enlargement with probable edema in the lung bases. Electronically Signed   By: Lucienne Capers M.D.   On: 02/27/2022 22:33   Nocturnal polysomnography  Result Date: 02/19/2022 [name], MD     02/24/2022  5:11 PM Physician Interpretation:  [facility] POLYSOMNOGRAPHY  INTERPRETATION REPORT STUDY DATE:  02/19/2022  PATIENT NAME:  Ruben [name]. Street        DATE OF BIRTH:  July 11, 1945 PATIENT ID:  [id number]    TYPE OF STUDY:  PSG READING PHYSICIAN: [name], MD, [name] REFERRED BY: Dr. Collene Leyden SCORING TECHNICIAN: Gaylyn Cheers, RPSGT HISTORY: 76 year old male with an underlying complex medical history of end-stage renal disease, on hemodialysis, history of alcohol use disorder by chart review, hypertension, hyperlipidemia, coronary artery disease with history of MI, A-fib, CHF, chronic respiratory failure with hypoxia, arthritis, and prior?smoking, who reports intermittent trembling affecting his hands and feet.?He does not sleep very well, he snores, he has never had a sleep study, he is on supplemental oxygen at night at 2 L/min per cardiology. Height: 71 in Weight: 154 lb (BMI 21) Neck Size: 0  MEDICATIONS: Tylenol, Proventil, Xanax, Pacerone, Eliquis, Lipitor, Zyrtec, Vitamin D3, Valium, Neurontin, Anusol-HC, Atarax, Emla, Mag-Ox, Reglan, Proamatine, Nitrostat, Prilosec, Zofran, Preperation H, Miralax, Compazine, Zoloft, Multivitamins, Remeron TECHNICAL DESCRIPTION: A registered sleep technologist was in attendance for the duration of the recording.  Data collection, scoring, video monitoring, and reporting were performed in compliance with the AASM Manual for the Scoring of Sleep and Associated Events; (Hypopnea is scored  based on the criteria listed in Section VIII D. 1b in the AASM Manual V2.6 using a 4% oxygen desaturation rule or Hypopnea is scored based on the criteria listed in Section VIII D. 1a in the AASM Manual V2.6 using 3% oxygen desaturation and /or arousal rule). SLEEP CONTINUITY AND SLEEP ARCHITECTURE:  Lights-out was at 22:13: and lights-on at  04:37:, with a total recording time of 6 hours, 24 min . Total sleep time ( TST) was 54.5 minutes with a markedly low sleep efficiency of 14.2%.  BODY POSITION:  TST was divided  between the following sleep positions: 22.9% supine;  72.5% lateral;  0% prone. Duration of total sleep and percent of total sleep in their respective position is as follows: supine 12 minutes (23%), non-supine 42 minutes (77%); right 16 minutes (29%), left 23 minutes (43%), and prone 00 minutes (0%). Total supine REM sleep time was 00 minutes (0% of total REM sleep). Sleep latency was normal at 18.5 minutes.  REM sleep latency was markedly delayed at 363.5 minutes. Of the total sleep  time, the percentage of stage N1 sleep was 72.5%, which is markedly increased, stage N2 sleep was 26%, which is reduced, stage N3 sleep was absent, and REM sleep was nearly absent at 1.8%. Wake after sleep onset (WASO) time accounted for 311 minutes with poor sleep consolidation. RESPIRATORY MONITORING:  Based on CMS criteria (using a 4% oxygen desaturation rule for scoring hypopneas), there were 8 apneas (5 obstructive; 1 central; 2 mixed), and 20 hypopneas.  Apnea index was 8.8. Hypopnea index was 22.0. The apnea-hypopnea index was 30.8/hour overall (48.0 supine, 0 non-supine; 0.0 REM, 0.0 supine REM).  There were 0 respiratory effort-related arousals (RERAs).  The RERA index was 0 events/h. Total respiratory disturbance index (RDI) was 30.8 events/h. RDI results showed: supine RDI  48.0 /h; non-supine RDI 25.7 /h; REM RDI 0.0 /h, supine REM RDI 0.0 /h. Based on AASM criteria (using a 3% oxygen desaturation and /or  arousal rule for scoring hypopneas), there were 8 apneas (5 obstructive; 1 central; 2 mixed), and 21 hypopneas. Apnea index was 8.8. Hypopnea index was 23.1. The apnea-hypopnea index was 31.9 overall (48.0 supine, 0 non-supine; 0.0 REM, 0.0 supine REM).  There were 0 respiratory effort-related arousals (RERAs).  The RERA index was 0 events/h. Total respiratory disturbance index (RDI) was 31.9 events/h. RDI results showed: supine RDI  48.0 /h; non-supine RDI 27.1 /h; REM RDI 0.0 /h, supine REM RDI 0.0 /h. OXIMETRY: Oxyhemoglobin Saturation Nadir during sleep was at  66%) from a mean of 91%. The study was started without supplemental oxygen. Of the Total sleep time (TST)   hypoxemia (=<88%) was present for  10.2 minutes, or 18.7% of total sleep time. Supplemental oxygen was started at 1 L/min at 3:14 AM. His baseline O2 saturation improved mildly. LIMB MOVEMENTS: There were 62 periodic limb movements of sleep (68.3/hr), of which 7 (7.7/hr) were associated with an arousal. AROUSAL: There were 38 arousals in total, for an arousal index of 42 arousals/hour.  Of these, 11 were identified as respiratory-related arousals (12 /h), 7 were PLM-related arousals (8 /h), and 30 were non-specific arousals (33 /h). EEG: Review of the EEG showed no abnormal electrical discharges and symmetrical bihemispheric findings.  EKG: The EKG revealed normal sinus rhythm (NSR) with . The average heart rate during sleep was 91 bpm. Intermittent PVCs (premature ventricular contractions) were noted. AUDIO/VIDEO REVIEW: The audio and video review did not show any abnormal or unusual behaviors, movements, phonations or vocalizations. The patient was very restless. The patient took 1 restroom break. Snoring was intermittent and mild. POST-STUDY QUESTIONNAIRE: Post study, the patient indicated, that sleep was the same as usual. The patient reported to the sleep technician that he does have frequent nights similar to this, especially after having his  dialysis. IMPRESSION: 1. Poor sleep pattern 2. Obstructive sleep apnea (OSA) 3. Dysfunctions associated with sleep stages or arousal from sleep 4. PLMD (periodic limb movement disorder) 5. Non-specific abnormal electrocardiogram (EKG) RECOMMENDATIONS: 1. This study was very limited secondary to poor sleep efficiency, and poor sleep consolidation.  During the limited amount of total sleep time of less than 1 hour, there was evidence of obstructive sleep disordered breathing.  I would recommend a trial of AutoPap therapy without supplemental oxygen. Oxygen can be added if need be later on, I would recommend a pulse oximetry test while on AutoPap therapy, once patient is comfortable using AutoPAP. Given the patient's complex medical history and sleep related complaints, treatment with positive airway pressure is recommended. Please note, that untreated  obstructive sleep apnea may carry additional perioperative morbidity. Patients with significant obstructive sleep apnea should receive perioperative PAP therapy and the surgeons and particularly the anesthesiologist should be informed of the diagnosis and the severity of the sleep disordered breathing. 2. Severe PLMs (periodic limb movements of sleep) were noted during this study with mild arousals; clinical correlation is recommended.  PLMS may improve with PAP therapy.  Medication effect from the antidepressant medication should be considered. 3. The study showed occasional PVCs on single lead EKG; clinical correlation is recommended. The patient is well-known to cardiology. 4. This study shows significant sleep fragmentation and abnormal sleep stage percentages; these are nonspecific findings and per se do not signify an intrinsic sleep disorder or a cause for the patient's sleep-related symptoms. Causes include (but are not limited to) the first night effect of the sleep study, circadian rhythm disturbances, medication effect or an underlying mood disorder or medical  problem. 5. The patient should be cautioned not to drive, work at heights, or operate dangerous or heavy equipment when tired or sleepy. Review and reiteration of good sleep hygiene measures should be pursued with any patient. 6. The patient will be seen in follow-up by Dr. Rexene Alberts at [facility] for discussion of the test results and further management strategies. The referring provider will be notified of the test results. I certify that I have reviewed the entire raw data recording prior to the issuance of this report in accordance with the Standards of Accreditation of the American Academy of Sleep Medicine (AASM). [name], MD, [name] [facility]) [place], ABPN (Neurology and Sleep)   Technical Report: General Information Name: Ruben [name], Ruben [name]. BMI: 21.48 Physician: [name], MD ID: [id number] Height: 71.0 in Technician: Gaylyn Cheers, RPSGT Sex: Male Weight: 154.0 lb Record: [id number] Age: 47 [1946-04-08] Date: 02/19/2022   Medical & Medication History   Mr. Moede is a 75 year old right-handed gentleman with an underlying complex medical history of end-stage renal disease, on hemodialysis, history of alcohol use disorder by chart review, hypertension, hyperlipidemia, coronary artery disease with history of MI, A-fib, CHF, chronic respiratory failure with hypoxia, arthritis, and prior smoking, who reports intermittent trembling affecting his hands and feet. Symptoms started after his heart attack in December 2022. He has had more stress he admits. His primary care physician, Dr. Doristine Bosworth started him recently on sertraline 25 mg strength once daily. He has been on it for about 2 months. He has had several different medication trials. Gabapentin 100 mg at bedtime did not help his tremors. He does have trouble sleeping. He has a prescription for Xanax and Valium but these are not currently active and were only prescribed for a procedure or as needed use before dialysis. He does have a  prescription for mirtazapine, 15 mg as well as 30 mg, he currently does not take any of these. He has a prescription for Reglan which he uses infrequently. He may go to bed around 10 PM but may not be asleep until the early morning hours. Rise time varies. He can drink up to 2 L of water per day but does not drink that much. He does not drink any caffeine, has stopped drinking alcohol in December 2022 and does admit to having history of alcohol abuse. He quit smoking also in December 2022. He lives with his wife. He is the oldest of 10 children, none of his siblings has a tremor. He does not recall his parents having a tremor. None of his kids have a  tremor, he has 3 boys and 1 daughter. He does not sleep very well, he snores, he has never had a sleep study, he is on supplemental oxygen at night at 2 L/min per cardiology. He has been on hemodialysis since February or March 2023. He does not have any nocturia, does not actually have much in the way of urine production any longer. Tylenol, Proventil, Xanax, Pacerone, Eliquis, Lipitor, Zyrtec, Vitamin D3, Valium, Neurontin, Anusol-HC, Atarax, Emla, Mag-Ox, Reglan, Proamatine, Nitrostat, Prilosec, Zofran, Preperation H, Miralax, Compazine, Zoloft, Multivitamins, Remeron  Sleep Disorder    Comments  Patient arrived for a diagnostic polysomnogram. Procedure explained and all questions answered. Standard paste setup without complications. Patient quite restless and had significantly reduced sleep efficiency and delay to persistent sleep. A lot of micro sleep. Patient reports having frequent nights like this, especially after having his dialysis appointments. Patient attempted to sleep supine, right, and left. Occasional mild audible snoring was heard. Respiratory events observed. 1/LPM oxygen was added at 03:14 to raise his SAO2 baseline. Occasional PVC's observed. Patient has a known cardiac history. No significant PLMS observed. One restroom visit.  Lights out: 10:13:05  PM Lights on: 04:37:09 AM Time Total Supine Side Prone Upright Recording (TRT) 6h 24.81m1h 44.53326mh 48.39m32m 0.326m 11m51.326m S13mp (TST) 0h 54.39m 0h23m.39m 0h 68m39m 0h 066m 0h 2.17mLatenc42m1 N2 N3 REM Onset Per. Slp. Eff. Actual 0h 0.326m 0h 9.39m68m 0.326m 78m3.39m 03326m8.39m 078m.326m 14.8326m Stg D2326mWake N1 N2 N3 REM Total 329.5 39.5 14.0 0.0 1.0 Supine 92.0 12.5 0.0 0.0 0.0 Side 189.0 24.5 14.0 0.0 1.0 Prone 0.0 0.0 0.0 0.0 0.0 Upright 48.5 2.5 0.0 0.0 0.0  Stg % Wake N1 N2 N3 REM Total 85.8 72.5 25.7 0.0 1.8 Supine 24.0 22.9 0.0 0.0 0.0 Side 49.2 45.0 25.7 0.0 1.8 Prone 0.0 0.0 0.0 0.0 0.0 Upright 12.6 4.6 0.0 0.0 0.0  Apnea Summary Sub Supine Side Prone Upright Total 8 Total '8 4 4 '$ 0 0   REM 0 0 0 0 0   NREM '8 4 4 '$ 0 0 Obs 5 REM 0 0 0 0 0   NREM '5 4 1 '$ 0 0 Mix 2 REM 0 0 0 0 0   NREM 2 0 2 0 0 Cen 1 REM 0 0 0 0 0   NREM 1 0 1 0 0 Rera Summary Sub Supine Side Prone Upright Total 0 Total 0 0 0 0 0   REM 0 0 0 0 0   NREM 0 0 0 0 0  Hypopnea Summary Sub Supine Side Prone Upright Total 21 Total '21 6 13 '$ 0 2   REM 0 0 0 0 0   NREM '21 6 13 '$ 0 2 4% Hypopnea Summary Sub Supine Side Prone Upright Total (4%) 20 Total '20 6 12 '$ 0 2   REM 0 0 0 0 0   NREM '20 6 12 '$ 0 2  AHI Total Obs Mix Cen 31.93 Apnea 8.81 5.50 2.20 1.10  Hypopnea 23.12 -- -- -- 30.83 Hypopnea (4%) 22.02 -- -- --  Total Supine Side Prone Upright Position AHI 31.93 48.00 25.82 0.00 48.00 REM AHI 0.00  NREM AHI 32.52  Position RDI 31.93 48.00 25.82 0.00 48.00 REM RDI 0.00  NREM RDI 32.52  4% Hypopnea Total Supine Side Prone Upright Position AHI (4%) 30.83 48.00 24.30 0.00 48.00 REM AHI (4%) 0.00  NREM AHI (4%) 31.40  Position RDI (4%) 30.83 48.00 24.30 0.00 48.00  REM RDI (4%) 0.00  NREM RDI (4%) 31.40  Desaturation Information Threshold: 2% <100% <90% <80% <70% <60% <50% <40% Supine 111.0 67.0 1.0 0.0 0.0 0.0 0.0 Side 256.0 176.0 2.0 0.0 0.0 0.0 0.0 Prone 0.0 0.0 0.0 0.0 0.0 0.0 0.0 Upright 68.0 53.0 1.0 0.0 0.0 0.0 0.0 Total 435.0 296.0 4.0 0.0 0.0 0.0 0.0 Index 71.6 48.7 0.7  0.0 0.0 0.0 0.0 Threshold: 3% <100% <90% <80% <70% <60% <50% <40% Supine 101.0 66.0 1.0 0.0 0.0 0.0 0.0 Side 233.0 173.0 2.0 0.0 0.0 0.0 0.0 Prone 0.0 0.0 0.0 0.0 0.0 0.0 0.0 Upright 67.0 53.0 1.0 0.0 0.0 0.0 0.0 Total 401.0 292.0 4.0 0.0 0.0 0.0 0.0 Index 66.0 48.1 0.7 0.0 0.0 0.0 0.0 Threshold: 4% <100% <90% <80% <70% <60% <50% <40% Supine 91.0 65.0 1.0 0.0 0.0 0.0 0.0 Side 218.0 168.0 2.0 0.0 0.0 0.0 0.0 Prone 0.0 0.0 0.0 0.0 0.0 0.0 0.0 Upright 66.0 53.0 1.0 0.0 0.0 0.0 0.0 Total 375.0 286.0 4.0 0.0 0.0 0.0 0.0 Index 61.7 47.1 0.7 0.0 0.0 0.0 0.0 Threshold: 3% <100% <90% <80% <70% <60% <50% <40% Supine 101 66 1 0 0 0 0 Side 233 173 2 0 0 0 0 Prone 0 0 0 0 0 0 0 Upright 67 53 1 0 0 0 0 Total 401 292 4 0 0 0 0  Awakening/Arousal Information # of Awakenings 30 Wake after sleep onset 311.12mWake after persistent sleep 0.017mrousal Assoc. Arousals Index Apneas 2 2.2 Hypopneas 9 9.9 Leg Movements 13 14.3 Snore 0 0.0 PTT Arousals 0 0.0 Spontaneous 32 35.2 Total 56 61.7 Leg Movement Information PLMS LMs Index Total LMs during PLMS 62 68.3 LMs w/ Microarousals 7 7.7 LM LMs Index w/ Microarousal 5 5.5 w/ Awakening 3 3.3 w/ Resp Event 0 0.0 Spontaneous 8 8.8 Total 13 14.3  Desaturation threshold setting: 3% Minimum desaturation setting: 10 seconds SaO2 nadir: 64% The longest event was a 26 sec obstructive Hypopnea with a minimum SaO2 of 87%. The lowest SaO2 was 79% associated with a 11 sec obstructive Hypopnea. EKG Rates EKG Avg Max Min Awake 91 128 76 Asleep 91 132 77 EKG Events: Tachycardia    Microbiology: Results for orders placed or performed during the hospital encounter of 02/27/22  Blood culture (routine x 2)     Status: None   Collection Time: 02/27/22  8:53 PM   Specimen: BLOOD  Result Value Ref Range Status   Specimen Description   Final    BLOOD BLOOD RIGHT WRIST Performed at [facility]  Special Requests   Final    BOTTLES DRAWN AEROBIC AND  ANAEROBIC Blood Culture adequate volume Performed at [facility] [place]  Culture   Final    NO GROWTH 5 DAYS Performed at [facility] [place] [id number]  Report Status 03/05/2022 FINAL  Final  Blood culture (routine x 2)     Status: None   Collection Time: 02/27/22  9:06 PM   Specimen: BLOOD  Result Value Ref Range Status   Specimen Description   Final    BLOOD BLOOD RIGHT FOREARM Performed at [facility] [place]  Special Requests   Final    BOTTLES DRAWN AEROBIC AND ANAEROBIC Blood Culture adequate volume Performed at [facility]  [address], [address] [place], [place] [address]    Culture   Final    NO GROWTH 5 DAYS Performed at [facility], [place] [address], [place], [place] [address]    Report Status 03/05/2022 FINAL  Final    Labs: CBC: Recent Labs  Lab 02/27/22 2047 03/01/22 0203 03/02/22 0327 03/03/22 0337 03/04/22 0422 03/05/22 0257 03/06/22 0503  WBC 7.2   < > 7.2 5.2 6.1 5.9 5.7  NEUTROABS 6.0  --   --   --   --   --   --   HGB 10.4*   < > 9.4* 8.4* 8.4* 8.3* 8.8*  HCT 30.5*   < > 28.3* 25.4* 25.4* 24.0* 25.4*  MCV 92.4   < > 95.0 94.8 95.1 93.4 95.1  PLT 126*   < > 82* 80* 77* 71* 77*   < > = values in this interval not displayed.   Basic Metabolic Panel: Recent Labs  Lab 03/02/22 0327 03/03/22 0330 03/03/22 0337 03/04/22 0422 03/05/22 0257 03/06/22 0503  NA 137 142 138 139 137 138  K 4.0 4.3 4.1 3.4* 3.3* 3.8  CL 95* 100 96* 98 95* 92*  CO2 26 21* '24 27 25 28  '$ GLUCOSE 108* 104* 108* 116* 123* 97  BUN 23 37* 36* 21 31* 23  CREATININE 5.79* 7.26* 7.05* 5.26* 6.51* 4.73*  CALCIUM 9.3 9.4 9.3 9.1 9.3 9.6  MG 2.4  --  2.5* 2.1 2.2 1.9  PHOS  --  3.8  --   --   --   --    Liver Function Tests: Recent Labs  Lab 03/02/22 0327 03/03/22 0330 03/03/22 0337 03/04/22 0422 03/05/22 0257 03/06/22 0503   AST 295*  --  234* 182* 142* 109*  ALT 177*  --  145* 133* 113* 98*  ALKPHOS 110  --  100 108 98 86  BILITOT 3.6*  --  2.6* 2.4* 2.3* 2.8*  PROT 6.0*  --  5.4* 5.4* 5.8* 5.8*  ALBUMIN 2.7* 2.7* 2.5* 2.4* 3.1* 3.0*   CBG: Recent Labs  Lab 02/28/22 1348  GLUCAP 87    Discharge time spent: greater than 30 minutes.  Signed: Tawni Millers, MD [facility] 03/06/2022

## 2022-03-06 NOTE — Progress Notes (Signed)
Physical Therapy Treatment Patient Details Name: Vennie Waymire MRN: [id number] DOB: October 23, 1945 Today's Date: 03/06/2022   History of Present Illness 76 y/o male presented to ED on 02/27/22 for hemorrhoid check to rule out rectal abscess. CT showed rectal wall thickening and cirrhosis. PMH: proctitis, CHF, severe mitral regurgitation, cirrhosis, hx of ETOH abuse, PAF, hx of MI    PT Comments    Patient reporting increased fatigue compared to typical day following HD. Required seated rest break after 50' of ambulation with RW. Able to negotiate 1 stair with min guard and heavy use of L rail. Wife present and stated they took more fluid off him than usual yesterday at HD which could be the reason he is more fatigued this date. D/c plan remains appropriate.    Recommendations for follow up therapy are one component of a multi-disciplinary discharge planning process, led by the attending physician.  Recommendations may be updated based on patient status, additional functional criteria and insurance authorization.  Follow Up Recommendations  Home health PT     Assistance Recommended at Discharge Intermittent Supervision/Assistance  Patient can return home with the following A little help with walking and/or transfers;A little help with bathing/dressing/bathroom;Assistance with cooking/housework;Help with stairs or ramp for entrance;Assist for transportation   Equipment Recommendations  None recommended by PT    Recommendations for Other Services       Precautions / Restrictions Precautions Precautions: Fall Restrictions Weight Bearing Restrictions: No     Mobility  Bed Mobility Overal bed mobility: Modified Independent                  Transfers Overall transfer level: Needs assistance Equipment used: Rolling Bobetta Korf (2 wheels) Transfers: Sit to/from Stand Sit to Stand: Supervision           General transfer comment: supervision for safety     Ambulation/Gait Ambulation/Gait assistance: Min guard Gait Distance (Feet): 50 Feet (+50') Assistive device: Rolling Willian Donson (2 wheels) Gait Pattern/deviations: Step-through pattern, Decreased stride length, Trunk flexed Gait velocity: decreased     General Gait Details: min guard this session due to patient feeling more weakness. Seated rest break in between bouts. Reporting increased fatigue compared to usual after HD day   Stairs Stairs: Yes Stairs assistance: Min guard Stair Management: One rail Left, Step to pattern, Forwards Number of Stairs: 1 General stair comments: min guard for safety. Heavy use of rail on L   Wheelchair Mobility    Modified Rankin (Stroke Patients Only)       Balance Overall balance assessment: Needs assistance Sitting-balance support: Feet supported, No upper extremity supported Sitting balance-[name] Scale: Fair     Standing balance support: During functional activity, Reliant on assistive device for balance Standing balance-[name] Scale: Poor Standing balance comment: reliant on RW                            Cognition Arousal/Alertness: Awake/alert Behavior During Therapy: WFL for tasks assessed/performed Overall Cognitive Status: Within Functional Limits for tasks assessed                                          Exercises      General Comments        Pertinent Vitals/Pain Pain Assessment Pain Assessment: No/denies pain    Home Living  Prior Function            PT Goals (current goals can now be found in the care plan section) Acute Rehab PT Goals Patient Stated Goal: to get stronger PT Goal Formulation: With patient/family Time For Goal Achievement: 03/09/2022 Potential to Achieve Goals: Good Progress towards PT goals: Progressing toward goals    Frequency    Min 3X/week      PT Plan Current plan remains appropriate    Co-evaluation               AM-PAC PT "6 Clicks" Mobility   Outcome Measure  Help needed turning from your back to your side while in a flat bed without using bedrails?: A Little Help needed moving from lying on your back to sitting on the side of a flat bed without using bedrails?: A Little Help needed moving to and from a bed to a chair (including a wheelchair)?: A Little Help needed standing up from a chair using your arms (e.g., wheelchair or bedside chair)?: A Little Help needed to walk in hospital room?: A Little Help needed climbing 3-5 steps with a railing? : A Little 6 Click Score: 18    End of Session   Activity Tolerance: Patient tolerated treatment well Patient left: in bed;with call bell/phone within reach;with family/visitor present Nurse Communication: Mobility status PT Visit Diagnosis: Unsteadiness on feet (R26.81);Muscle weakness (generalized) (M62.81);History of falling (Z91.81)     Time: 7681-1572 PT Time Calculation (min) (ACUTE ONLY): 16 min  Charges:  $Therapeutic Activity: 8-22 mins                     Armonee Bojanowski A. Gilford Rile PT, DPT Acute Rehabilitation Services Office [phone]    Linna Hoff 03/06/2022, 10:15 AM

## 2022-03-06 NOTE — Consult Note (Signed)
   [facility] Inpatient Consult   03/06/2022  Ruben [name] February 08, 1946 [id number]  [facility] [[facility]] Patient: Ruben [name]  *Reviewed for readmission d/t showing with [facility] as PCP  Primary Care Provider: [name], MD [facility] Physician at [facility]   Patient screened for less than 30 days readmission hospitalization with noted extreme high risk score for unplanned readmission risk   Patient has changed Primary Care Provider: To [facility] which is listed with [facility] for Care Management.  Called to patient's room and phone answered but hung up.  Call then placed to phone number provided in electronic medical record demographics and spoke with daughter, Ruben [name], HIPAA verified and she confirms that the PCP is now with [facility].  Encouraged her to assist patient in changing this with his insurance carrier to assure information is provided to the correct provider. Daughter states that she would.  For questions contact:   Natividad Brood, RN BSN [place]  [phone] business mobile phone Toll free office [phone]  *[place]  [phone] Fax number: [phone] [email or web address]

## 2022-03-06 NOTE — Progress Notes (Signed)
DISCHARGE NOTE HOME Ruben [name] to be discharged Home per MD order. Discussed prescriptions and follow up appointments with the patient, wife,  and Ramona Slinger daughter. Diagnosis, treatment, prescriptions given to patient; medication list explained in detail. Patient and daughtrer verbalized knowledge and understanding.  Skin clean, dry and sacral wound noted and unchanged. IV catheter discontinued intact. Site without signs and symptoms of complications. Dressing and pressure applied. Pt denies pain at the site currently. No complaints noted.   An After Visit Summary (AVS) was printed and given to the patient. Patient escorted via wheelchair, and discharged home via private auto.  Virgina Jock, RN

## 2022-03-06 NOTE — TOC Transition Note (Addendum)
Transition of Care [facility]) - CM/SW Discharge Note   Patient Details  Name: Ruben [name] MRN: [id number] Date of Birth: 25-Oct-1945  Transition of Care [facility]) CM/SW Contact:  Tom-Johnson, Renea Ee, RN Phone Number: 03/06/2022, 10:24 AM   Clinical Narrative:     Patient is scheduled for discharge today. Home health info and Outpatient f/u info on AVS. Denies any other needs. Wife to transport at discharge. No further TOC needs noted.   16:15- CM consulted for outpatient Palliative. Spoke with daughter Leonia Reeves and she chose [facility]. CM called in referral to [facility] and she asked if patient has an active Cancer diagnosis. Patient does not and therefore does not meet criteria at this time. Referral called in to [facility] and CM spoke with Ebony Hail ans she will reach out to patient and daughter. No further TOC needs noted.     Final next level of care: [place] Barriers to Discharge: Barriers Resolved   Patient Goals and CMS Choice Patient states their goals for this hospitalization and ongoing recovery are:: To return home CMS Medicare.gov Compare Post Acute Care list provided to:: Patient Represenative (must comment) Choice offered to / list presented to : Adult Children  Discharge Placement                Patient to be transferred to facility by: Wife      Discharge Plan and Services In-house Referral: NA Discharge Planning Services: CM Consult Post Acute Care Choice: Home Health          DME Arranged: N/A DME Agency: NA       HH Arranged: PT, OT HH Agency: [place] Date HH Agency Contacted: 03/04/22 Time [place]: [id number] Representative spoke with at [place]: [place] Determinants of Health ([place]) Interventions     Readmission Risk Interventions    03/04/2022    2:07 PM  Readmission Risk Prevention Plan  Transportation Screening Complete  Medication Review Press photographer) Complete   PCP or Specialist appointment within 3-5 days of discharge Complete  HRI or [place] Not Applicable

## 2022-03-06 NOTE — Progress Notes (Signed)
D/C order noted. Contacted [place] SW to advise clinic of pt's d/c today and that pt will resume care tomorrow.   Melven Sartorius Renal Navigator [phone]

## 2022-03-07 ENCOUNTER — Ambulatory Visit (HOSPITAL_COMMUNITY): Payer: Medicare Other

## 2022-03-07 DIAGNOSIS — Z992 Dependence on renal dialysis: Secondary | ICD-10-CM | POA: Diagnosis not present

## 2022-03-07 DIAGNOSIS — I129 Hypertensive chronic kidney disease with stage 1 through stage 4 chronic kidney disease, or unspecified chronic kidney disease: Secondary | ICD-10-CM | POA: Diagnosis not present

## 2022-03-07 DIAGNOSIS — N186 End stage renal disease: Secondary | ICD-10-CM | POA: Diagnosis not present

## 2022-03-07 DIAGNOSIS — D631 Anemia in chronic kidney disease: Secondary | ICD-10-CM | POA: Diagnosis not present

## 2022-03-07 DIAGNOSIS — N2581 Secondary hyperparathyroidism of renal origin: Secondary | ICD-10-CM | POA: Diagnosis not present

## 2022-03-07 DIAGNOSIS — D649 Anemia, unspecified: Secondary | ICD-10-CM | POA: Diagnosis not present

## 2022-03-09 DIAGNOSIS — I2510 Atherosclerotic heart disease of native coronary artery without angina pectoris: Secondary | ICD-10-CM | POA: Diagnosis not present

## 2022-03-09 DIAGNOSIS — I480 Paroxysmal atrial fibrillation: Secondary | ICD-10-CM | POA: Diagnosis not present

## 2022-03-10 ENCOUNTER — Ambulatory Visit (HOSPITAL_COMMUNITY): Payer: Medicare Other

## 2022-03-10 DIAGNOSIS — D631 Anemia in chronic kidney disease: Secondary | ICD-10-CM | POA: Diagnosis not present

## 2022-03-10 DIAGNOSIS — Z992 Dependence on renal dialysis: Secondary | ICD-10-CM | POA: Diagnosis not present

## 2022-03-10 DIAGNOSIS — D649 Anemia, unspecified: Secondary | ICD-10-CM | POA: Diagnosis not present

## 2022-03-10 DIAGNOSIS — N186 End stage renal disease: Secondary | ICD-10-CM | POA: Diagnosis not present

## 2022-03-10 DIAGNOSIS — N2581 Secondary hyperparathyroidism of renal origin: Secondary | ICD-10-CM | POA: Diagnosis not present

## 2022-03-11 DIAGNOSIS — R7303 Prediabetes: Secondary | ICD-10-CM | POA: Diagnosis not present

## 2022-03-11 DIAGNOSIS — I2510 Atherosclerotic heart disease of native coronary artery without angina pectoris: Secondary | ICD-10-CM | POA: Diagnosis not present

## 2022-03-11 DIAGNOSIS — E43 Unspecified severe protein-calorie malnutrition: Secondary | ICD-10-CM | POA: Diagnosis not present

## 2022-03-11 DIAGNOSIS — I252 Old myocardial infarction: Secondary | ICD-10-CM | POA: Diagnosis not present

## 2022-03-11 DIAGNOSIS — R1310 Dysphagia, unspecified: Secondary | ICD-10-CM | POA: Diagnosis not present

## 2022-03-11 DIAGNOSIS — E785 Hyperlipidemia, unspecified: Secondary | ICD-10-CM | POA: Diagnosis not present

## 2022-03-11 DIAGNOSIS — K921 Melena: Secondary | ICD-10-CM | POA: Diagnosis not present

## 2022-03-11 DIAGNOSIS — M1990 Unspecified osteoarthritis, unspecified site: Secondary | ICD-10-CM | POA: Diagnosis not present

## 2022-03-11 DIAGNOSIS — K7460 Unspecified cirrhosis of liver: Secondary | ICD-10-CM | POA: Diagnosis not present

## 2022-03-11 DIAGNOSIS — R188 Other ascites: Secondary | ICD-10-CM | POA: Diagnosis not present

## 2022-03-11 DIAGNOSIS — K769 Liver disease, unspecified: Secondary | ICD-10-CM | POA: Diagnosis not present

## 2022-03-11 DIAGNOSIS — I480 Paroxysmal atrial fibrillation: Secondary | ICD-10-CM | POA: Diagnosis not present

## 2022-03-11 DIAGNOSIS — E875 Hyperkalemia: Secondary | ICD-10-CM | POA: Diagnosis not present

## 2022-03-11 DIAGNOSIS — K6289 Other specified diseases of anus and rectum: Secondary | ICD-10-CM | POA: Diagnosis not present

## 2022-03-11 DIAGNOSIS — N186 End stage renal disease: Secondary | ICD-10-CM | POA: Diagnosis not present

## 2022-03-11 DIAGNOSIS — Z992 Dependence on renal dialysis: Secondary | ICD-10-CM | POA: Diagnosis not present

## 2022-03-11 DIAGNOSIS — I5023 Acute on chronic systolic (congestive) heart failure: Secondary | ICD-10-CM | POA: Diagnosis not present

## 2022-03-11 DIAGNOSIS — F32A Depression, unspecified: Secondary | ICD-10-CM | POA: Diagnosis not present

## 2022-03-11 DIAGNOSIS — D631 Anemia in chronic kidney disease: Secondary | ICD-10-CM | POA: Diagnosis not present

## 2022-03-11 DIAGNOSIS — K2270 Barrett's esophagus without dysplasia: Secondary | ICD-10-CM | POA: Diagnosis not present

## 2022-03-11 DIAGNOSIS — I132 Hypertensive heart and chronic kidney disease with heart failure and with stage 5 chronic kidney disease, or end stage renal disease: Secondary | ICD-10-CM | POA: Diagnosis not present

## 2022-03-11 DIAGNOSIS — I340 Nonrheumatic mitral (valve) insufficiency: Secondary | ICD-10-CM | POA: Diagnosis not present

## 2022-03-12 ENCOUNTER — Ambulatory Visit (HOSPITAL_COMMUNITY): Payer: Medicare Other

## 2022-03-12 DIAGNOSIS — Z992 Dependence on renal dialysis: Secondary | ICD-10-CM | POA: Diagnosis not present

## 2022-03-12 DIAGNOSIS — N2581 Secondary hyperparathyroidism of renal origin: Secondary | ICD-10-CM | POA: Diagnosis not present

## 2022-03-12 DIAGNOSIS — N186 End stage renal disease: Secondary | ICD-10-CM | POA: Diagnosis not present

## 2022-03-12 DIAGNOSIS — D649 Anemia, unspecified: Secondary | ICD-10-CM | POA: Diagnosis not present

## 2022-03-12 DIAGNOSIS — D631 Anemia in chronic kidney disease: Secondary | ICD-10-CM | POA: Diagnosis not present

## 2022-03-14 ENCOUNTER — Ambulatory Visit (HOSPITAL_COMMUNITY): Payer: Medicare Other

## 2022-03-14 DIAGNOSIS — Z992 Dependence on renal dialysis: Secondary | ICD-10-CM | POA: Diagnosis not present

## 2022-03-14 DIAGNOSIS — D631 Anemia in chronic kidney disease: Secondary | ICD-10-CM | POA: Diagnosis not present

## 2022-03-14 DIAGNOSIS — N186 End stage renal disease: Secondary | ICD-10-CM | POA: Diagnosis not present

## 2022-03-14 DIAGNOSIS — N2581 Secondary hyperparathyroidism of renal origin: Secondary | ICD-10-CM | POA: Diagnosis not present

## 2022-03-14 DIAGNOSIS — D649 Anemia, unspecified: Secondary | ICD-10-CM | POA: Diagnosis not present

## 2022-03-18 IMAGING — DX DG CHEST 1V PORT
1 series · 1 of 1 positions shown · non-contrast
Comparison: 08/25/2008

CLINICAL DATA: Shortness of breath, respiratory distress, chest
pain

EXAM:
PORTABLE CHEST 1 VIEW

[chest]
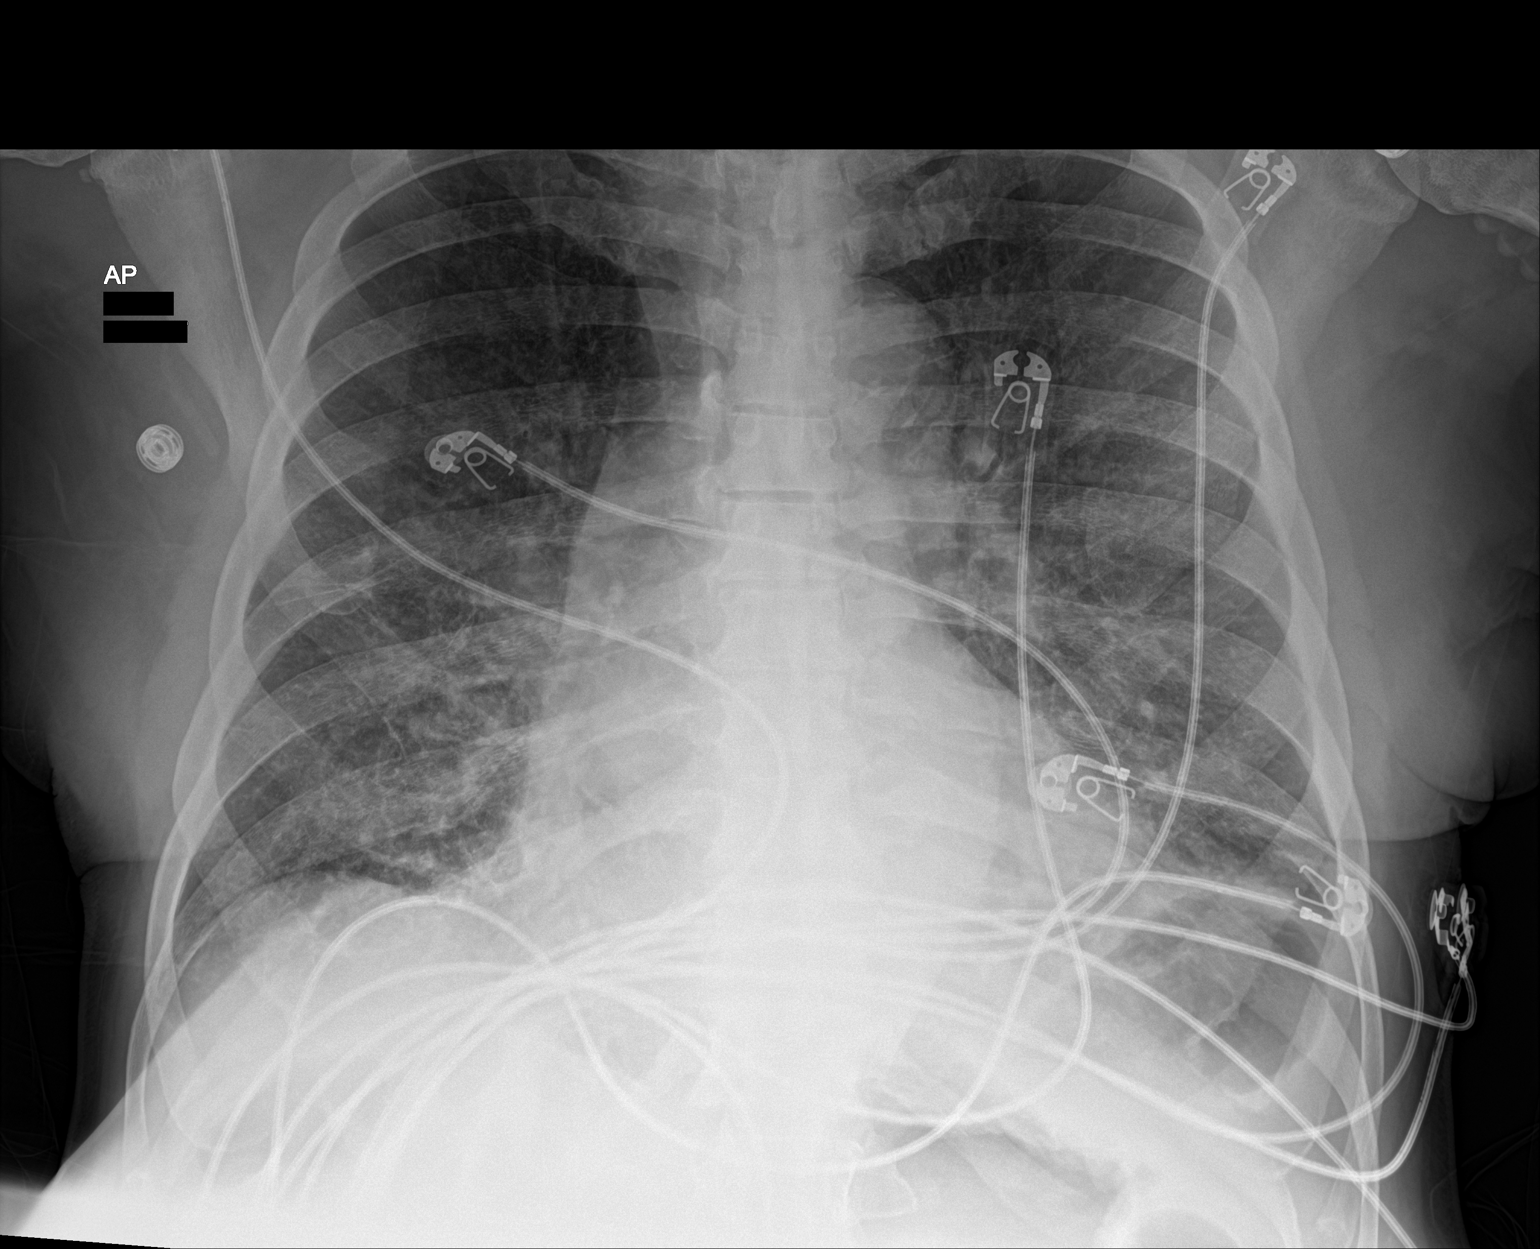

[1 of 1 positions shown; findings below may reference images not displayed]

FINDINGS: Heart is normal size. Interstitial prominence noted in the mid to
lower lungs bilaterally. Small left pleural effusion. No acute bony
abnormality.
IMPRESSION: Interstitial prominence in the mid and lower lung zones. This could
reflect infection, possibly atypical/viral pneumonia. Less likely
edema.

Small left effusion.

## 2022-03-20 IMAGING — DX DG CHEST 1V PORT
1 series · 1 of 1 positions shown · non-contrast
Comparison: 04/29/2021

CLINICAL DATA: Leukocytosis.  Wheezing.

EXAM:
PORTABLE CHEST 1 VIEW

[chest ap]
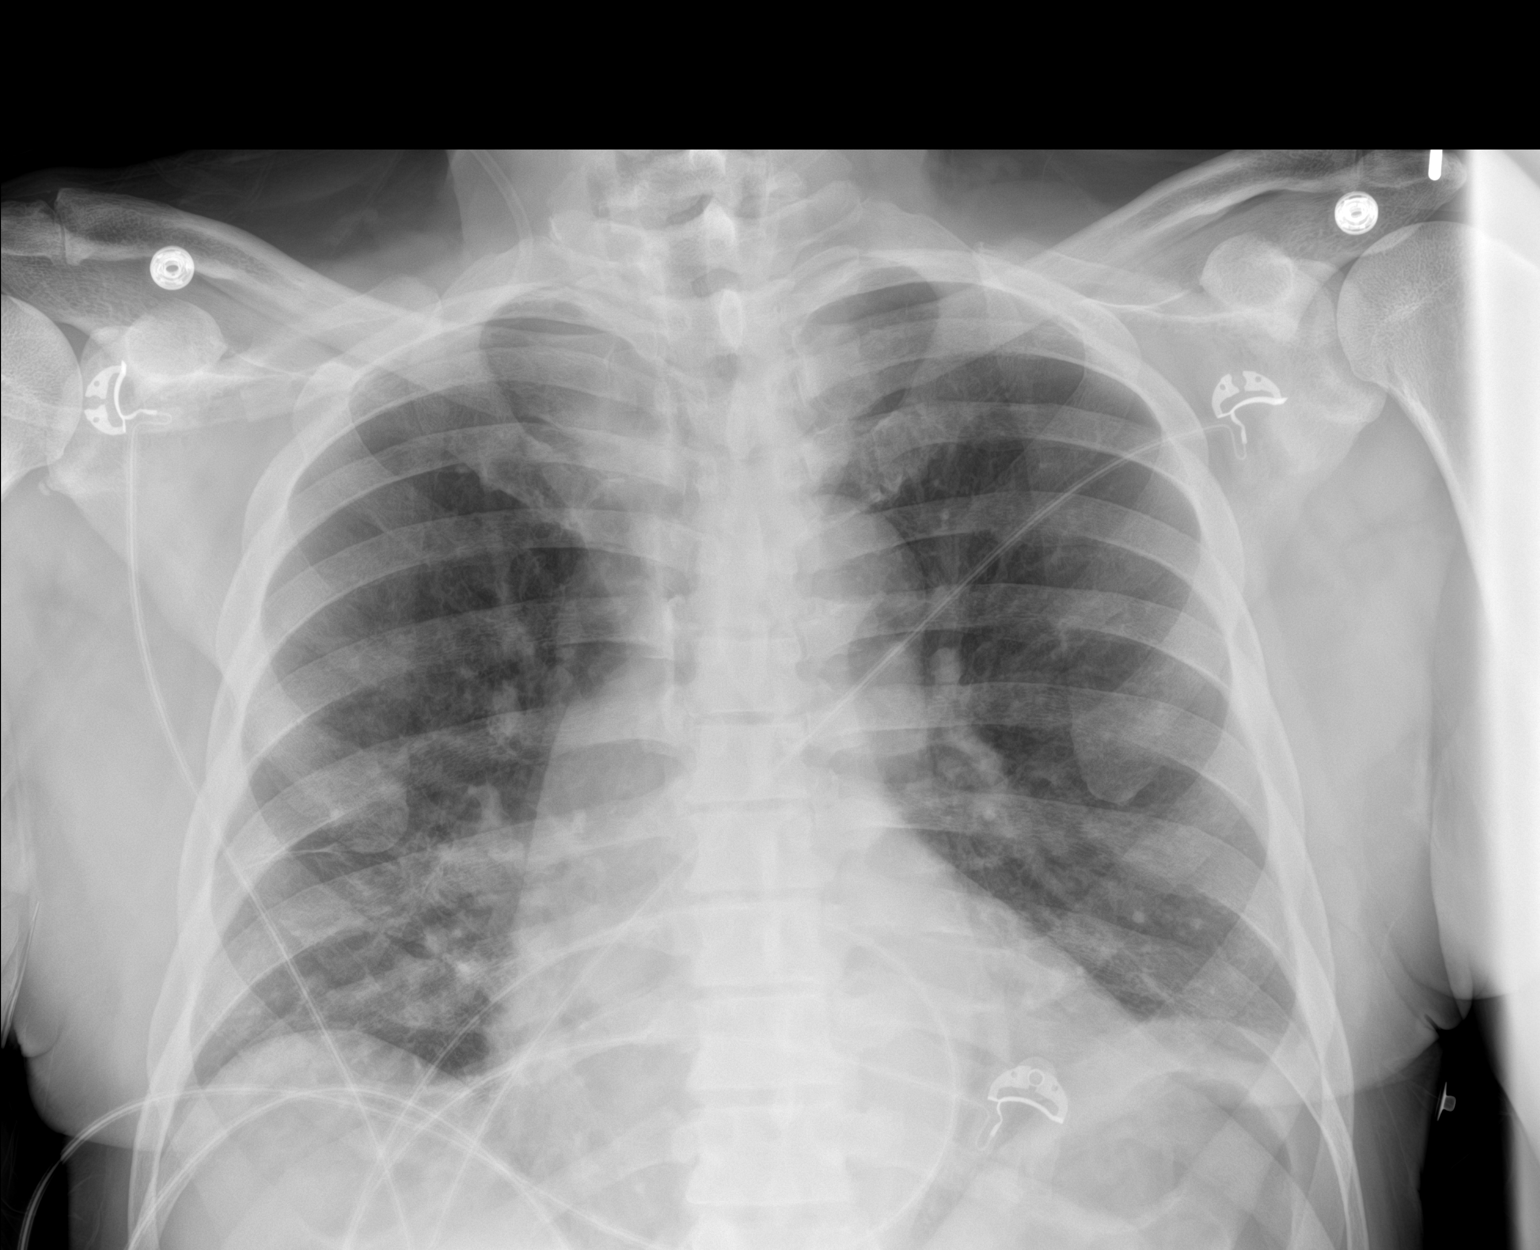

[1 of 1 positions shown; findings below may reference images not displayed]

FINDINGS: The cardiac silhouette remains borderline enlarged. Minimal somewhat
patchy opacity in the right lower lung zone. Clear left lung. Normal
vascularity. No acute bony abnormality.
IMPRESSION: Possible minimal right lower lung zone pneumonia or patchy
atelectasis.

## 2022-03-20 IMAGING — DX DG CHEST 1V PORT
1 series · 1 of 1 positions shown · non-contrast
Comparison: 05/01/2021

CLINICAL DATA: Status post PICC line placement.

EXAM:
PORTABLE CHEST 1 VIEW

[chest]
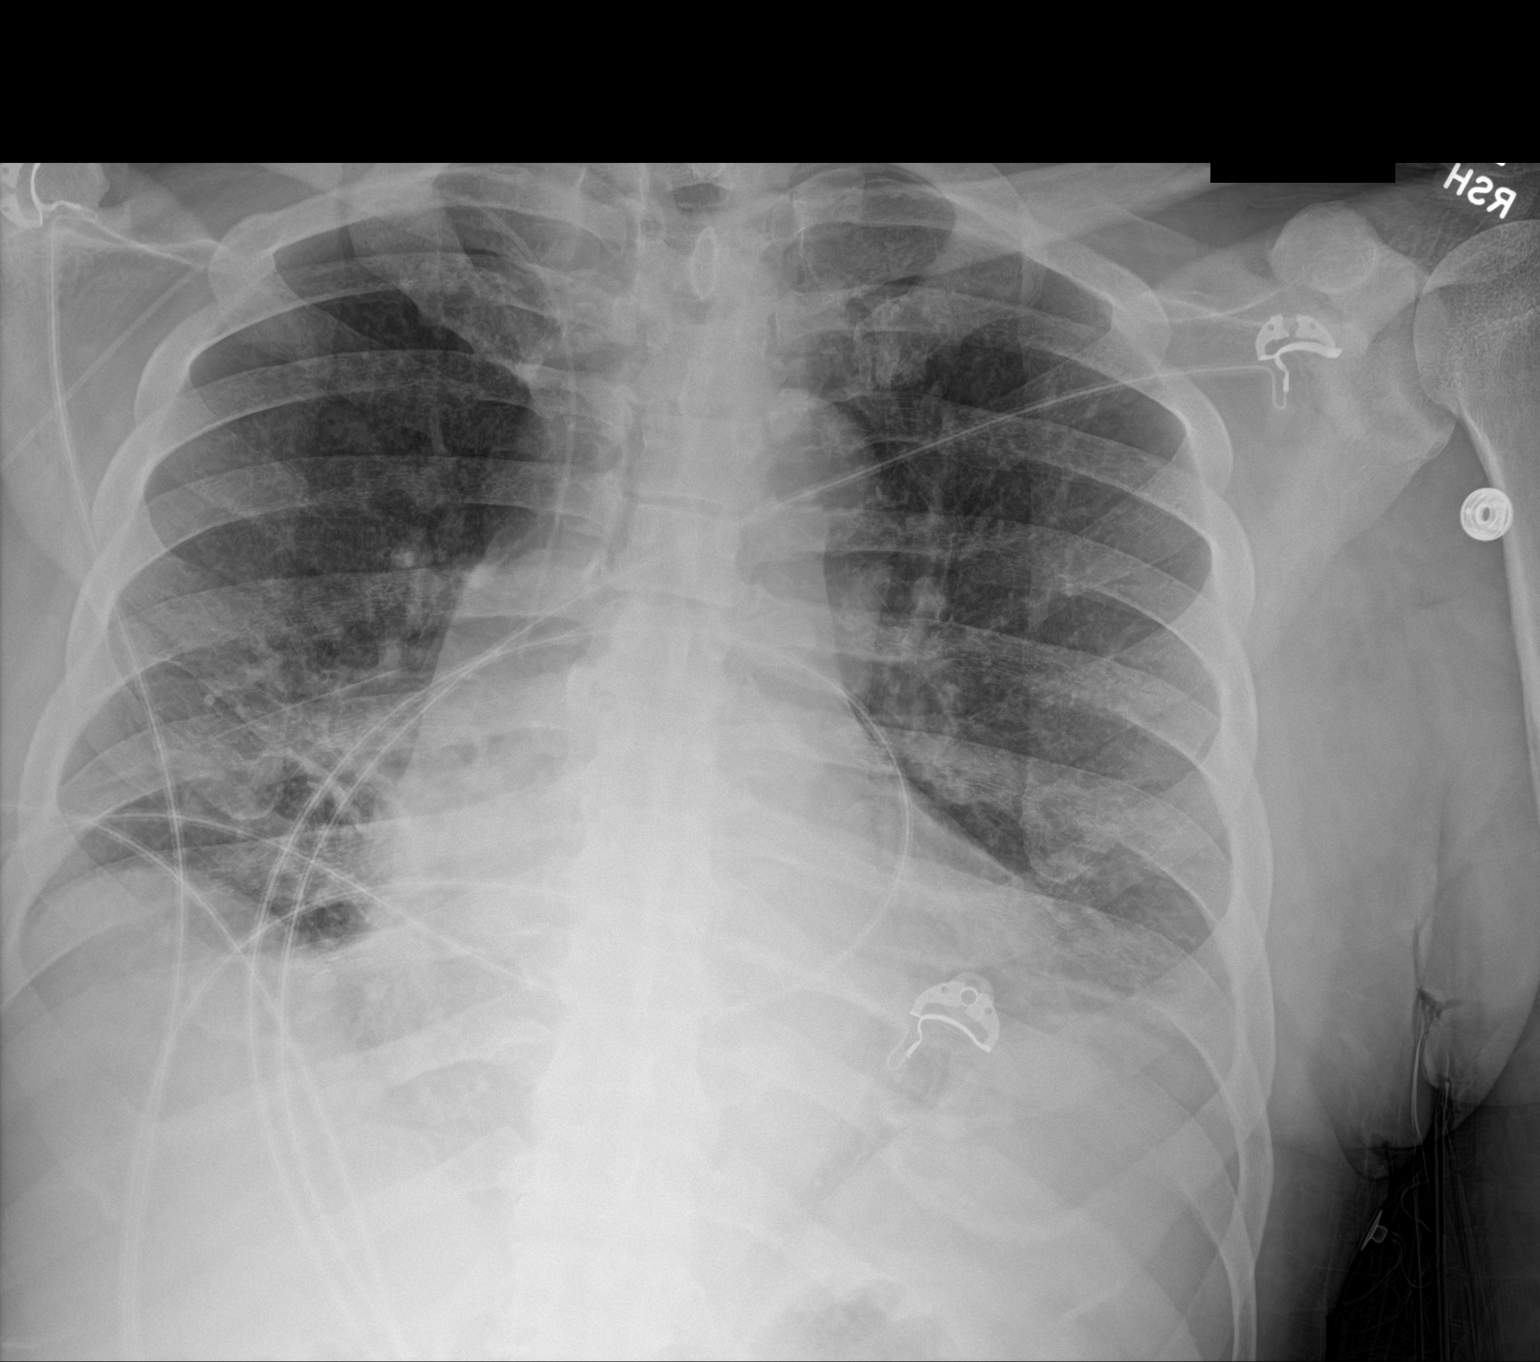

[1 of 1 positions shown; findings below may reference images not displayed]

FINDINGS: Right arm PICC line has been placed. The tip is in the region of the
SVC. Heart and mediastinum are stable. Increased hazy and patchy
densities in lower lungs compared to the recent exam and likely
associated with atelectasis.
IMPRESSION: PICC line tip in the SVC region.

## 2022-03-25 ENCOUNTER — Encounter (HOSPITAL_COMMUNITY): Payer: Medicare Other

## 2022-03-26 ENCOUNTER — Encounter (HOSPITAL_COMMUNITY): Payer: Medicare Other

## 2022-04-04 DIAGNOSIS — 419620001 Death: Secondary | SNOMED CT | POA: Diagnosis not present

## 2022-04-04 DEATH — deceased

## 2022-05-26 ENCOUNTER — Telehealth: Payer: Self-pay | Admitting: Neurology

## 2022-05-26 NOTE — Telephone Encounter (Signed)
I'm sorry to hear that. We will send a condolence card to his home address on file.

## 2022-05-26 NOTE — Telephone Encounter (Signed)
Ruben [name] has passed away.

## 2022-05-29 ENCOUNTER — Ambulatory Visit: Payer: Self-pay | Admitting: Neurology

## 2024-03-18 ENCOUNTER — Telehealth (HOSPITAL_COMMUNITY): Payer: Self-pay | Admitting: Cardiology

## 2024-03-18 NOTE — Telephone Encounter (Signed)
 Per front office staff Patient passed away 2022-04-13 per family call 03/17/24  Chart updated
